# Patient Record
Sex: Female | Born: 1940 | Race: Black or African American | Hispanic: No | Smoking: Former smoker
Health system: Southern US, Community
[De-identification: ages and names within clinical notes are randomized; demographics above are authoritative.]

## PROBLEM LIST (undated history)

## (undated) DIAGNOSIS — E7800 Pure hypercholesterolemia, unspecified: Secondary | ICD-10-CM

## (undated) DIAGNOSIS — I639 Cerebral infarction, unspecified: Secondary | ICD-10-CM

## (undated) DIAGNOSIS — R569 Unspecified convulsions: Secondary | ICD-10-CM

## (undated) DIAGNOSIS — IMO0001 Reserved for inherently not codable concepts without codable children: Secondary | ICD-10-CM

## (undated) DIAGNOSIS — M1990 Unspecified osteoarthritis, unspecified site: Secondary | ICD-10-CM

## (undated) DIAGNOSIS — D126 Benign neoplasm of colon, unspecified: Secondary | ICD-10-CM

## (undated) DIAGNOSIS — K5900 Constipation, unspecified: Secondary | ICD-10-CM

## (undated) DIAGNOSIS — D6489 Other specified anemias: Secondary | ICD-10-CM

## (undated) DIAGNOSIS — G2581 Restless legs syndrome: Secondary | ICD-10-CM

## (undated) DIAGNOSIS — R490 Dysphonia: Secondary | ICD-10-CM

## (undated) DIAGNOSIS — E785 Hyperlipidemia, unspecified: Secondary | ICD-10-CM

## (undated) DIAGNOSIS — F329 Major depressive disorder, single episode, unspecified: Secondary | ICD-10-CM

## (undated) DIAGNOSIS — I10 Essential (primary) hypertension: Secondary | ICD-10-CM

## (undated) DIAGNOSIS — G5711 Meralgia paresthetica, right lower limb: Secondary | ICD-10-CM

## (undated) DIAGNOSIS — M549 Dorsalgia, unspecified: Secondary | ICD-10-CM

## (undated) DIAGNOSIS — N189 Chronic kidney disease, unspecified: Secondary | ICD-10-CM

## (undated) DIAGNOSIS — Z992 Dependence on renal dialysis: Secondary | ICD-10-CM

## (undated) DIAGNOSIS — Z8719 Personal history of other diseases of the digestive system: Secondary | ICD-10-CM

## (undated) DIAGNOSIS — R6889 Other general symptoms and signs: Secondary | ICD-10-CM

## (undated) DIAGNOSIS — I509 Heart failure, unspecified: Secondary | ICD-10-CM

## (undated) DIAGNOSIS — E079 Disorder of thyroid, unspecified: Secondary | ICD-10-CM

## (undated) DIAGNOSIS — E278 Other specified disorders of adrenal gland: Secondary | ICD-10-CM

## (undated) DIAGNOSIS — I739 Peripheral vascular disease, unspecified: Secondary | ICD-10-CM

## (undated) DIAGNOSIS — R269 Unspecified abnormalities of gait and mobility: Principal | ICD-10-CM

## (undated) DIAGNOSIS — R0989 Other specified symptoms and signs involving the circulatory and respiratory systems: Secondary | ICD-10-CM

## (undated) DIAGNOSIS — J449 Chronic obstructive pulmonary disease, unspecified: Secondary | ICD-10-CM

## (undated) DIAGNOSIS — K5792 Diverticulitis of intestine, part unspecified, without perforation or abscess without bleeding: Secondary | ICD-10-CM

## (undated) DIAGNOSIS — G459 Transient cerebral ischemic attack, unspecified: Secondary | ICD-10-CM

## (undated) DIAGNOSIS — J189 Pneumonia, unspecified organism: Secondary | ICD-10-CM

## (undated) DIAGNOSIS — N289 Disorder of kidney and ureter, unspecified: Secondary | ICD-10-CM

## (undated) DIAGNOSIS — K219 Gastro-esophageal reflux disease without esophagitis: Secondary | ICD-10-CM

## (undated) DIAGNOSIS — K589 Irritable bowel syndrome without diarrhea: Secondary | ICD-10-CM

## (undated) DIAGNOSIS — F32A Depression, unspecified: Secondary | ICD-10-CM

## (undated) HISTORY — DX: Other specified symptoms and signs involving the circulatory and respiratory systems: R09.89

## (undated) HISTORY — DX: Meralgia paresthetica, right lower limb: G57.11

## (undated) HISTORY — DX: Unspecified convulsions: R56.9

## (undated) HISTORY — DX: Hyperlipidemia, unspecified: E78.5

## (undated) HISTORY — DX: Chronic kidney disease, unspecified: N18.9

## (undated) HISTORY — DX: Unspecified abnormalities of gait and mobility: R26.9

## (undated) HISTORY — PX: INSERTION OF DIALYSIS CATHETER: SHX1324

## (undated) HISTORY — DX: Dysphonia: R49.0

## (undated) HISTORY — DX: Other general symptoms and signs: R68.89

## (undated) HISTORY — DX: Essential (primary) hypertension: I10

## (undated) HISTORY — PX: ABDOMINAL HYSTERECTOMY: SHX81

## (undated) HISTORY — PX: CHOLECYSTECTOMY: SHX55

## (undated) HISTORY — PX: TUBAL LIGATION: SHX77

## (undated) HISTORY — DX: Dependence on renal dialysis: Z99.2

## (undated) HISTORY — DX: Reserved for inherently not codable concepts without codable children: IMO0001

## (undated) HISTORY — DX: Restless legs syndrome: G25.81

## (undated) HISTORY — DX: Transient cerebral ischemic attack, unspecified: G45.9

## (undated) HISTORY — PX: APPENDECTOMY: SHX54

## (undated) HISTORY — DX: Benign neoplasm of colon, unspecified: D12.6

## (undated) HISTORY — DX: Chronic obstructive pulmonary disease, unspecified: J44.9

---

## 1997-11-29 ENCOUNTER — Encounter: Admission: RE | Admit: 1997-11-29 | Discharge: 1997-11-29 | Payer: Self-pay | Admitting: Family Medicine

## 1997-12-19 ENCOUNTER — Encounter: Admission: RE | Admit: 1997-12-19 | Discharge: 1997-12-19 | Payer: Self-pay | Admitting: Family Medicine

## 1997-12-25 ENCOUNTER — Encounter: Admission: RE | Admit: 1997-12-25 | Discharge: 1997-12-25 | Payer: Self-pay | Admitting: Family Medicine

## 1998-01-03 ENCOUNTER — Encounter: Admission: RE | Admit: 1998-01-03 | Discharge: 1998-01-03 | Payer: Self-pay | Admitting: Family Medicine

## 1998-01-10 ENCOUNTER — Encounter: Admission: RE | Admit: 1998-01-10 | Discharge: 1998-01-10 | Payer: Self-pay | Admitting: Family Medicine

## 1998-01-11 ENCOUNTER — Encounter: Admission: RE | Admit: 1998-01-11 | Discharge: 1998-01-11 | Payer: Self-pay | Admitting: Family Medicine

## 1998-04-12 ENCOUNTER — Encounter: Admission: RE | Admit: 1998-04-12 | Discharge: 1998-04-12 | Payer: Self-pay | Admitting: Family Medicine

## 1998-06-11 ENCOUNTER — Encounter: Admission: RE | Admit: 1998-06-11 | Discharge: 1998-06-11 | Payer: Self-pay | Admitting: Family Medicine

## 1998-06-12 ENCOUNTER — Emergency Department (HOSPITAL_COMMUNITY): Admission: EM | Admit: 1998-06-12 | Discharge: 1998-06-12 | Payer: Self-pay | Admitting: Emergency Medicine

## 1998-06-18 ENCOUNTER — Encounter: Admission: RE | Admit: 1998-06-18 | Discharge: 1998-06-18 | Payer: Self-pay | Admitting: Family Medicine

## 1998-06-22 ENCOUNTER — Encounter: Admission: RE | Admit: 1998-06-22 | Discharge: 1998-06-22 | Payer: Self-pay | Admitting: Family Medicine

## 1998-08-10 ENCOUNTER — Encounter: Admission: RE | Admit: 1998-08-10 | Discharge: 1998-08-10 | Payer: Self-pay | Admitting: Family Medicine

## 1998-09-07 ENCOUNTER — Encounter: Admission: RE | Admit: 1998-09-07 | Discharge: 1998-09-07 | Payer: Self-pay | Admitting: Family Medicine

## 1998-09-14 ENCOUNTER — Encounter: Admission: RE | Admit: 1998-09-14 | Discharge: 1998-09-14 | Payer: Self-pay | Admitting: Sports Medicine

## 1998-10-15 ENCOUNTER — Encounter: Payer: Self-pay | Admitting: Emergency Medicine

## 1998-10-15 ENCOUNTER — Emergency Department (HOSPITAL_COMMUNITY): Admission: EM | Admit: 1998-10-15 | Discharge: 1998-10-15 | Payer: Self-pay | Admitting: Emergency Medicine

## 1998-10-17 ENCOUNTER — Ambulatory Visit (HOSPITAL_COMMUNITY): Admission: RE | Admit: 1998-10-17 | Discharge: 1998-10-17 | Payer: Self-pay | Admitting: Family Medicine

## 1998-10-17 ENCOUNTER — Encounter: Admission: RE | Admit: 1998-10-17 | Discharge: 1998-10-17 | Payer: Self-pay | Admitting: Family Medicine

## 1998-11-01 ENCOUNTER — Encounter: Admission: RE | Admit: 1998-11-01 | Discharge: 1998-11-01 | Payer: Self-pay | Admitting: Family Medicine

## 1998-12-19 ENCOUNTER — Ambulatory Visit (HOSPITAL_COMMUNITY): Admission: RE | Admit: 1998-12-19 | Discharge: 1998-12-19 | Payer: Self-pay | Admitting: Nephrology

## 1999-02-07 ENCOUNTER — Ambulatory Visit (HOSPITAL_COMMUNITY): Admission: RE | Admit: 1999-02-07 | Discharge: 1999-02-08 | Payer: Self-pay | Admitting: Nephrology

## 1999-02-07 ENCOUNTER — Encounter: Payer: Self-pay | Admitting: Nephrology

## 1999-04-05 ENCOUNTER — Encounter: Admission: RE | Admit: 1999-04-05 | Discharge: 1999-04-05 | Payer: Self-pay | Admitting: Family Medicine

## 1999-04-25 ENCOUNTER — Emergency Department (HOSPITAL_COMMUNITY): Admission: EM | Admit: 1999-04-25 | Discharge: 1999-04-25 | Payer: Self-pay | Admitting: Emergency Medicine

## 1999-06-14 ENCOUNTER — Encounter: Admission: RE | Admit: 1999-06-14 | Discharge: 1999-06-14 | Payer: Self-pay | Admitting: Family Medicine

## 1999-10-22 ENCOUNTER — Encounter: Admission: RE | Admit: 1999-10-22 | Discharge: 1999-10-22 | Payer: Self-pay | Admitting: Family Medicine

## 1999-11-06 ENCOUNTER — Encounter: Admission: RE | Admit: 1999-11-06 | Discharge: 1999-11-06 | Payer: Self-pay | Admitting: Family Medicine

## 1999-11-19 ENCOUNTER — Encounter: Admission: RE | Admit: 1999-11-19 | Discharge: 2000-02-17 | Payer: Self-pay | Admitting: Sports Medicine

## 1999-11-20 ENCOUNTER — Encounter: Admission: RE | Admit: 1999-11-20 | Discharge: 1999-11-20 | Payer: Self-pay | Admitting: Family Medicine

## 1999-12-10 ENCOUNTER — Emergency Department (HOSPITAL_COMMUNITY): Admission: EM | Admit: 1999-12-10 | Discharge: 1999-12-10 | Payer: Self-pay | Admitting: Emergency Medicine

## 1999-12-10 ENCOUNTER — Encounter: Payer: Self-pay | Admitting: Emergency Medicine

## 1999-12-31 ENCOUNTER — Encounter: Admission: RE | Admit: 1999-12-31 | Discharge: 1999-12-31 | Payer: Self-pay | Admitting: Neurology

## 1999-12-31 ENCOUNTER — Encounter: Admission: RE | Admit: 1999-12-31 | Discharge: 1999-12-31 | Payer: Self-pay | Admitting: *Deleted

## 1999-12-31 ENCOUNTER — Encounter: Payer: Self-pay | Admitting: *Deleted

## 2000-01-23 ENCOUNTER — Encounter: Admission: RE | Admit: 2000-01-23 | Discharge: 2000-01-23 | Payer: Self-pay | Admitting: Family Medicine

## 2000-02-23 ENCOUNTER — Emergency Department (HOSPITAL_COMMUNITY): Admission: EM | Admit: 2000-02-23 | Discharge: 2000-02-23 | Payer: Self-pay | Admitting: Emergency Medicine

## 2000-02-23 ENCOUNTER — Encounter: Payer: Self-pay | Admitting: Emergency Medicine

## 2000-02-25 ENCOUNTER — Encounter: Admission: RE | Admit: 2000-02-25 | Discharge: 2000-02-25 | Payer: Self-pay | Admitting: Sports Medicine

## 2000-03-31 ENCOUNTER — Encounter: Payer: Self-pay | Admitting: Gastroenterology

## 2000-03-31 ENCOUNTER — Encounter: Admission: RE | Admit: 2000-03-31 | Discharge: 2000-03-31 | Payer: Self-pay | Admitting: Gastroenterology

## 2000-04-07 ENCOUNTER — Encounter: Admission: RE | Admit: 2000-04-07 | Discharge: 2000-04-07 | Payer: Self-pay | Admitting: Family Medicine

## 2000-05-02 ENCOUNTER — Encounter (INDEPENDENT_AMBULATORY_CARE_PROVIDER_SITE_OTHER): Payer: Self-pay | Admitting: *Deleted

## 2000-05-28 ENCOUNTER — Encounter: Admission: RE | Admit: 2000-05-28 | Discharge: 2000-05-28 | Payer: Self-pay | Admitting: Family Medicine

## 2000-05-28 ENCOUNTER — Encounter: Payer: Self-pay | Admitting: Family Medicine

## 2000-05-28 ENCOUNTER — Observation Stay (HOSPITAL_COMMUNITY): Admission: AD | Admit: 2000-05-28 | Discharge: 2000-05-29 | Payer: Self-pay | Admitting: Family Medicine

## 2000-05-29 ENCOUNTER — Encounter: Payer: Self-pay | Admitting: Family Medicine

## 2000-06-02 HISTORY — PX: NASAL SINUS SURGERY: SHX719

## 2000-06-02 HISTORY — PX: OTHER SURGICAL HISTORY: SHX169

## 2000-07-27 ENCOUNTER — Encounter: Payer: Self-pay | Admitting: Sports Medicine

## 2000-07-27 ENCOUNTER — Encounter: Admission: RE | Admit: 2000-07-27 | Discharge: 2000-07-27 | Payer: Self-pay | Admitting: Sports Medicine

## 2000-07-27 ENCOUNTER — Encounter: Admission: RE | Admit: 2000-07-27 | Discharge: 2000-07-27 | Payer: Self-pay | Admitting: Family Medicine

## 2000-08-04 ENCOUNTER — Encounter: Admission: RE | Admit: 2000-08-04 | Discharge: 2000-08-04 | Payer: Self-pay | Admitting: Family Medicine

## 2000-08-19 ENCOUNTER — Encounter: Admission: RE | Admit: 2000-08-19 | Discharge: 2000-08-19 | Payer: Self-pay | Admitting: Sports Medicine

## 2000-08-19 ENCOUNTER — Encounter: Payer: Self-pay | Admitting: Sports Medicine

## 2000-08-21 ENCOUNTER — Encounter: Admission: RE | Admit: 2000-08-21 | Discharge: 2000-08-21 | Payer: Self-pay | Admitting: Family Medicine

## 2000-09-02 ENCOUNTER — Ambulatory Visit: Admission: RE | Admit: 2000-09-02 | Discharge: 2000-09-02 | Payer: Self-pay | Admitting: Gynecology

## 2000-09-08 ENCOUNTER — Encounter: Admission: RE | Admit: 2000-09-08 | Discharge: 2000-09-08 | Payer: Self-pay | Admitting: Family Medicine

## 2000-09-18 ENCOUNTER — Encounter: Payer: Self-pay | Admitting: Gynecology

## 2000-09-22 ENCOUNTER — Encounter (INDEPENDENT_AMBULATORY_CARE_PROVIDER_SITE_OTHER): Payer: Self-pay

## 2000-09-22 ENCOUNTER — Inpatient Hospital Stay (HOSPITAL_COMMUNITY): Admission: RE | Admit: 2000-09-22 | Discharge: 2000-09-26 | Payer: Self-pay | Admitting: Gynecology

## 2000-09-27 ENCOUNTER — Encounter: Payer: Self-pay | Admitting: Emergency Medicine

## 2000-09-28 ENCOUNTER — Encounter: Payer: Self-pay | Admitting: Neurology

## 2000-09-28 ENCOUNTER — Encounter (INDEPENDENT_AMBULATORY_CARE_PROVIDER_SITE_OTHER): Payer: Self-pay | Admitting: *Deleted

## 2000-09-28 ENCOUNTER — Inpatient Hospital Stay (HOSPITAL_COMMUNITY): Admission: EM | Admit: 2000-09-28 | Discharge: 2000-10-03 | Payer: Self-pay | Admitting: Family Medicine

## 2000-10-01 ENCOUNTER — Encounter: Payer: Self-pay | Admitting: Family Medicine

## 2000-10-06 ENCOUNTER — Emergency Department (HOSPITAL_COMMUNITY): Admission: EM | Admit: 2000-10-06 | Discharge: 2000-10-06 | Payer: Self-pay | Admitting: Emergency Medicine

## 2000-10-11 ENCOUNTER — Emergency Department (HOSPITAL_COMMUNITY): Admission: EM | Admit: 2000-10-11 | Discharge: 2000-10-11 | Payer: Self-pay | Admitting: Emergency Medicine

## 2000-10-11 ENCOUNTER — Encounter: Payer: Self-pay | Admitting: Neurology

## 2000-10-12 ENCOUNTER — Encounter: Admission: RE | Admit: 2000-10-12 | Discharge: 2000-10-12 | Payer: Self-pay | Admitting: Family Medicine

## 2000-10-29 ENCOUNTER — Encounter: Admission: RE | Admit: 2000-10-29 | Discharge: 2000-10-29 | Payer: Self-pay | Admitting: Family Medicine

## 2000-11-03 ENCOUNTER — Encounter: Admission: RE | Admit: 2000-11-03 | Discharge: 2000-11-03 | Payer: Self-pay | Admitting: Obstetrics & Gynecology

## 2000-11-06 ENCOUNTER — Encounter: Admission: RE | Admit: 2000-11-06 | Discharge: 2000-11-06 | Payer: Self-pay | Admitting: Family Medicine

## 2000-11-11 ENCOUNTER — Encounter: Payer: Self-pay | Admitting: Neurological Surgery

## 2000-11-11 ENCOUNTER — Ambulatory Visit (HOSPITAL_COMMUNITY): Admission: RE | Admit: 2000-11-11 | Discharge: 2000-11-11 | Payer: Self-pay | Admitting: Neurological Surgery

## 2000-12-02 ENCOUNTER — Encounter: Admission: RE | Admit: 2000-12-02 | Discharge: 2000-12-02 | Payer: Self-pay | Admitting: Family Medicine

## 2000-12-14 ENCOUNTER — Encounter: Admission: RE | Admit: 2000-12-14 | Discharge: 2000-12-14 | Payer: Self-pay | Admitting: Family Medicine

## 2000-12-22 ENCOUNTER — Encounter: Payer: Self-pay | Admitting: Neurological Surgery

## 2000-12-24 ENCOUNTER — Inpatient Hospital Stay (HOSPITAL_COMMUNITY): Admission: RE | Admit: 2000-12-24 | Discharge: 2001-01-01 | Payer: Self-pay | Admitting: Neurological Surgery

## 2000-12-28 ENCOUNTER — Encounter: Payer: Self-pay | Admitting: Neurological Surgery

## 2001-01-14 ENCOUNTER — Encounter: Admission: RE | Admit: 2001-01-14 | Discharge: 2001-01-14 | Payer: Self-pay | Admitting: Family Medicine

## 2001-02-15 ENCOUNTER — Ambulatory Visit (HOSPITAL_COMMUNITY): Admission: RE | Admit: 2001-02-15 | Discharge: 2001-02-15 | Payer: Self-pay | Admitting: Neurological Surgery

## 2001-02-15 ENCOUNTER — Encounter: Payer: Self-pay | Admitting: Neurological Surgery

## 2001-02-17 ENCOUNTER — Encounter: Admission: RE | Admit: 2001-02-17 | Discharge: 2001-02-17 | Payer: Self-pay | Admitting: Family Medicine

## 2001-04-07 ENCOUNTER — Encounter: Admission: RE | Admit: 2001-04-07 | Discharge: 2001-04-07 | Payer: Self-pay | Admitting: Family Medicine

## 2001-05-17 ENCOUNTER — Encounter: Admission: RE | Admit: 2001-05-17 | Discharge: 2001-05-17 | Payer: Self-pay | Admitting: Family Medicine

## 2001-06-02 HISTORY — PX: CARDIAC CATHETERIZATION: SHX172

## 2001-07-08 ENCOUNTER — Encounter: Admission: RE | Admit: 2001-07-08 | Discharge: 2001-07-08 | Payer: Self-pay | Admitting: Family Medicine

## 2001-07-08 ENCOUNTER — Ambulatory Visit (HOSPITAL_COMMUNITY): Admission: RE | Admit: 2001-07-08 | Discharge: 2001-07-08 | Payer: Self-pay | Admitting: Family Medicine

## 2001-07-14 ENCOUNTER — Encounter: Admission: RE | Admit: 2001-07-14 | Discharge: 2001-07-14 | Payer: Self-pay | Admitting: Family Medicine

## 2001-07-27 ENCOUNTER — Encounter: Payer: Self-pay | Admitting: Cardiovascular Disease

## 2001-07-27 ENCOUNTER — Ambulatory Visit (HOSPITAL_COMMUNITY): Admission: RE | Admit: 2001-07-27 | Discharge: 2001-07-27 | Payer: Self-pay | Admitting: Cardiovascular Disease

## 2001-08-17 ENCOUNTER — Encounter: Payer: Self-pay | Admitting: Neurological Surgery

## 2001-08-17 ENCOUNTER — Ambulatory Visit (HOSPITAL_COMMUNITY): Admission: RE | Admit: 2001-08-17 | Discharge: 2001-08-17 | Payer: Self-pay | Admitting: Neurological Surgery

## 2001-08-23 ENCOUNTER — Encounter: Payer: Self-pay | Admitting: Sports Medicine

## 2001-08-23 ENCOUNTER — Encounter: Admission: RE | Admit: 2001-08-23 | Discharge: 2001-08-23 | Payer: Self-pay | Admitting: Sports Medicine

## 2001-08-24 ENCOUNTER — Encounter: Admission: RE | Admit: 2001-08-24 | Discharge: 2001-08-24 | Payer: Self-pay | Admitting: Family Medicine

## 2001-09-27 ENCOUNTER — Encounter: Admission: RE | Admit: 2001-09-27 | Discharge: 2001-09-27 | Payer: Self-pay | Admitting: Sports Medicine

## 2001-11-26 ENCOUNTER — Ambulatory Visit (HOSPITAL_COMMUNITY): Admission: RE | Admit: 2001-11-26 | Discharge: 2001-11-26 | Payer: Self-pay | Admitting: Cardiovascular Disease

## 2001-12-20 ENCOUNTER — Encounter: Admission: RE | Admit: 2001-12-20 | Discharge: 2001-12-20 | Payer: Self-pay | Admitting: Family Medicine

## 2001-12-27 ENCOUNTER — Encounter: Admission: RE | Admit: 2001-12-27 | Discharge: 2001-12-27 | Payer: Self-pay | Admitting: Sports Medicine

## 2001-12-27 ENCOUNTER — Encounter: Payer: Self-pay | Admitting: Sports Medicine

## 2001-12-28 ENCOUNTER — Encounter: Admission: RE | Admit: 2001-12-28 | Discharge: 2001-12-28 | Payer: Self-pay | Admitting: Sports Medicine

## 2002-01-05 ENCOUNTER — Encounter: Admission: RE | Admit: 2002-01-05 | Discharge: 2002-01-05 | Payer: Self-pay | Admitting: Family Medicine

## 2002-01-07 ENCOUNTER — Encounter: Admission: RE | Admit: 2002-01-07 | Discharge: 2002-04-07 | Payer: Self-pay | Admitting: Sports Medicine

## 2002-01-25 ENCOUNTER — Encounter: Admission: RE | Admit: 2002-01-25 | Discharge: 2002-01-25 | Payer: Self-pay | Admitting: Family Medicine

## 2002-03-04 ENCOUNTER — Encounter: Admission: RE | Admit: 2002-03-04 | Discharge: 2002-03-04 | Payer: Self-pay | Admitting: Family Medicine

## 2002-04-14 ENCOUNTER — Encounter: Admission: RE | Admit: 2002-04-14 | Discharge: 2002-04-14 | Payer: Self-pay | Admitting: Family Medicine

## 2002-05-30 ENCOUNTER — Encounter: Admission: RE | Admit: 2002-05-30 | Discharge: 2002-05-30 | Payer: Self-pay | Admitting: Sports Medicine

## 2002-06-07 ENCOUNTER — Encounter: Admission: RE | Admit: 2002-06-07 | Discharge: 2002-06-07 | Payer: Self-pay | Admitting: Sports Medicine

## 2002-06-07 ENCOUNTER — Encounter: Payer: Self-pay | Admitting: Sports Medicine

## 2002-06-14 ENCOUNTER — Encounter: Admission: RE | Admit: 2002-06-14 | Discharge: 2002-06-14 | Payer: Self-pay | Admitting: Sports Medicine

## 2002-06-20 ENCOUNTER — Encounter: Admission: RE | Admit: 2002-06-20 | Discharge: 2002-06-20 | Payer: Self-pay | Admitting: Sports Medicine

## 2002-06-20 ENCOUNTER — Encounter: Payer: Self-pay | Admitting: Sports Medicine

## 2002-06-23 ENCOUNTER — Encounter: Payer: Self-pay | Admitting: Emergency Medicine

## 2002-06-23 ENCOUNTER — Emergency Department (HOSPITAL_COMMUNITY): Admission: EM | Admit: 2002-06-23 | Discharge: 2002-06-23 | Payer: Self-pay | Admitting: Emergency Medicine

## 2002-07-11 ENCOUNTER — Encounter: Admission: RE | Admit: 2002-07-11 | Discharge: 2002-07-11 | Payer: Self-pay | Admitting: Family Medicine

## 2002-07-12 ENCOUNTER — Encounter: Admission: RE | Admit: 2002-07-12 | Discharge: 2002-07-12 | Payer: Self-pay | Admitting: Family Medicine

## 2002-07-17 ENCOUNTER — Encounter: Payer: Self-pay | Admitting: *Deleted

## 2002-07-17 ENCOUNTER — Encounter: Admission: RE | Admit: 2002-07-17 | Discharge: 2002-07-17 | Payer: Self-pay | Admitting: *Deleted

## 2002-08-09 ENCOUNTER — Ambulatory Visit (HOSPITAL_COMMUNITY): Admission: RE | Admit: 2002-08-09 | Discharge: 2002-08-09 | Payer: Self-pay | Admitting: Family Medicine

## 2002-08-09 ENCOUNTER — Encounter: Admission: RE | Admit: 2002-08-09 | Discharge: 2002-08-09 | Payer: Self-pay | Admitting: Family Medicine

## 2002-09-22 ENCOUNTER — Encounter: Admission: RE | Admit: 2002-09-22 | Discharge: 2002-09-22 | Payer: Self-pay | Admitting: Family Medicine

## 2002-10-07 ENCOUNTER — Emergency Department (HOSPITAL_COMMUNITY): Admission: EM | Admit: 2002-10-07 | Discharge: 2002-10-07 | Payer: Self-pay | Admitting: Emergency Medicine

## 2002-10-13 ENCOUNTER — Encounter: Admission: RE | Admit: 2002-10-13 | Discharge: 2002-10-13 | Payer: Self-pay | Admitting: Family Medicine

## 2002-10-19 ENCOUNTER — Encounter: Admission: RE | Admit: 2002-10-19 | Discharge: 2002-10-19 | Payer: Self-pay | Admitting: Family Medicine

## 2002-11-02 ENCOUNTER — Encounter: Admission: RE | Admit: 2002-11-02 | Discharge: 2002-11-02 | Payer: Self-pay | Admitting: Family Medicine

## 2002-11-07 ENCOUNTER — Encounter: Payer: Self-pay | Admitting: Family Medicine

## 2002-11-07 ENCOUNTER — Encounter: Admission: RE | Admit: 2002-11-07 | Discharge: 2002-11-07 | Payer: Self-pay | Admitting: Family Medicine

## 2002-11-09 ENCOUNTER — Encounter: Admission: RE | Admit: 2002-11-09 | Discharge: 2002-11-09 | Payer: Self-pay | Admitting: Family Medicine

## 2002-11-11 ENCOUNTER — Encounter: Admission: RE | Admit: 2002-11-11 | Discharge: 2002-11-11 | Payer: Self-pay | Admitting: Family Medicine

## 2002-11-17 ENCOUNTER — Encounter: Admission: RE | Admit: 2002-11-17 | Discharge: 2002-11-17 | Payer: Self-pay | Admitting: Family Medicine

## 2002-11-23 ENCOUNTER — Encounter: Admission: RE | Admit: 2002-11-23 | Discharge: 2002-11-23 | Payer: Self-pay | Admitting: Family Medicine

## 2002-11-24 ENCOUNTER — Encounter: Admission: RE | Admit: 2002-11-24 | Discharge: 2002-11-24 | Payer: Self-pay | Admitting: Sports Medicine

## 2002-11-24 ENCOUNTER — Encounter: Payer: Self-pay | Admitting: Sports Medicine

## 2002-12-08 ENCOUNTER — Encounter: Admission: RE | Admit: 2002-12-08 | Discharge: 2002-12-08 | Payer: Self-pay | Admitting: Family Medicine

## 2002-12-14 ENCOUNTER — Encounter: Admission: RE | Admit: 2002-12-14 | Discharge: 2002-12-14 | Payer: Self-pay | Admitting: Pediatrics

## 2002-12-28 ENCOUNTER — Encounter: Admission: RE | Admit: 2002-12-28 | Discharge: 2002-12-28 | Payer: Self-pay | Admitting: Family Medicine

## 2003-01-30 ENCOUNTER — Encounter: Admission: RE | Admit: 2003-01-30 | Discharge: 2003-01-30 | Payer: Self-pay | Admitting: Family Medicine

## 2003-03-22 ENCOUNTER — Encounter: Admission: RE | Admit: 2003-03-22 | Discharge: 2003-03-22 | Payer: Self-pay | Admitting: Family Medicine

## 2003-06-30 ENCOUNTER — Encounter: Admission: RE | Admit: 2003-06-30 | Discharge: 2003-06-30 | Payer: Self-pay | Admitting: *Deleted

## 2003-09-04 ENCOUNTER — Encounter: Admission: RE | Admit: 2003-09-04 | Discharge: 2003-09-04 | Payer: Self-pay | Admitting: Family Medicine

## 2003-10-05 ENCOUNTER — Emergency Department (HOSPITAL_COMMUNITY): Admission: EM | Admit: 2003-10-05 | Discharge: 2003-10-05 | Payer: Self-pay | Admitting: Emergency Medicine

## 2003-10-11 ENCOUNTER — Encounter: Admission: RE | Admit: 2003-10-11 | Discharge: 2003-10-11 | Payer: Self-pay | Admitting: Sports Medicine

## 2003-10-11 ENCOUNTER — Encounter: Admission: RE | Admit: 2003-10-11 | Discharge: 2003-10-11 | Payer: Self-pay | Admitting: Family Medicine

## 2004-01-24 ENCOUNTER — Encounter: Admission: RE | Admit: 2004-01-24 | Discharge: 2004-01-24 | Payer: Self-pay | Admitting: Family Medicine

## 2004-03-04 ENCOUNTER — Ambulatory Visit: Payer: Self-pay | Admitting: Family Medicine

## 2004-04-11 ENCOUNTER — Ambulatory Visit: Payer: Self-pay | Admitting: Sports Medicine

## 2004-04-16 ENCOUNTER — Ambulatory Visit: Payer: Self-pay | Admitting: Family Medicine

## 2004-06-04 ENCOUNTER — Ambulatory Visit: Payer: Self-pay | Admitting: Family Medicine

## 2004-09-11 ENCOUNTER — Ambulatory Visit: Payer: Self-pay | Admitting: Family Medicine

## 2004-10-01 ENCOUNTER — Ambulatory Visit: Payer: Self-pay | Admitting: Family Medicine

## 2004-10-08 ENCOUNTER — Ambulatory Visit: Payer: Self-pay | Admitting: Sports Medicine

## 2004-10-21 ENCOUNTER — Encounter: Admission: RE | Admit: 2004-10-21 | Discharge: 2004-10-21 | Payer: Self-pay | Admitting: Sports Medicine

## 2004-11-11 ENCOUNTER — Ambulatory Visit: Payer: Self-pay | Admitting: Family Medicine

## 2004-11-25 ENCOUNTER — Ambulatory Visit: Payer: Self-pay | Admitting: Sports Medicine

## 2004-11-29 ENCOUNTER — Ambulatory Visit: Payer: Self-pay | Admitting: Family Medicine

## 2004-12-11 ENCOUNTER — Ambulatory Visit: Payer: Self-pay | Admitting: Family Medicine

## 2004-12-30 ENCOUNTER — Ambulatory Visit: Payer: Self-pay | Admitting: Family Medicine

## 2004-12-31 ENCOUNTER — Ambulatory Visit: Payer: Self-pay | Admitting: Family Medicine

## 2005-01-20 ENCOUNTER — Ambulatory Visit: Payer: Self-pay | Admitting: Sports Medicine

## 2005-05-05 ENCOUNTER — Ambulatory Visit: Payer: Self-pay | Admitting: Family Medicine

## 2005-05-20 ENCOUNTER — Ambulatory Visit: Payer: Self-pay | Admitting: Sports Medicine

## 2005-06-18 ENCOUNTER — Ambulatory Visit: Payer: Self-pay | Admitting: Family Medicine

## 2005-06-24 ENCOUNTER — Ambulatory Visit (HOSPITAL_COMMUNITY): Admission: RE | Admit: 2005-06-24 | Discharge: 2005-06-24 | Payer: Self-pay | Admitting: Sports Medicine

## 2005-07-15 ENCOUNTER — Ambulatory Visit (HOSPITAL_COMMUNITY): Admission: RE | Admit: 2005-07-15 | Discharge: 2005-07-15 | Payer: Self-pay | Admitting: Family Medicine

## 2005-07-16 ENCOUNTER — Ambulatory Visit: Payer: Self-pay | Admitting: Family Medicine

## 2005-08-18 ENCOUNTER — Ambulatory Visit: Payer: Self-pay | Admitting: Family Medicine

## 2005-09-10 ENCOUNTER — Ambulatory Visit: Payer: Self-pay | Admitting: Family Medicine

## 2005-09-12 ENCOUNTER — Ambulatory Visit (HOSPITAL_COMMUNITY): Admission: RE | Admit: 2005-09-12 | Discharge: 2005-09-12 | Payer: Self-pay | Admitting: Family Medicine

## 2005-09-19 ENCOUNTER — Ambulatory Visit: Payer: Self-pay | Admitting: Family Medicine

## 2005-10-01 ENCOUNTER — Ambulatory Visit: Payer: Self-pay | Admitting: Family Medicine

## 2006-01-22 ENCOUNTER — Ambulatory Visit (HOSPITAL_COMMUNITY): Admission: RE | Admit: 2006-01-22 | Discharge: 2006-01-22 | Payer: Self-pay | Admitting: Gastroenterology

## 2006-02-26 ENCOUNTER — Ambulatory Visit: Payer: Self-pay | Admitting: Family Medicine

## 2006-03-11 ENCOUNTER — Ambulatory Visit: Payer: Self-pay | Admitting: Family Medicine

## 2006-04-06 ENCOUNTER — Ambulatory Visit: Payer: Self-pay | Admitting: Sports Medicine

## 2006-04-20 ENCOUNTER — Ambulatory Visit: Payer: Self-pay | Admitting: Family Medicine

## 2006-05-29 ENCOUNTER — Ambulatory Visit (HOSPITAL_COMMUNITY): Admission: RE | Admit: 2006-05-29 | Discharge: 2006-05-29 | Payer: Self-pay | Admitting: Family Medicine

## 2006-05-29 ENCOUNTER — Ambulatory Visit: Payer: Self-pay | Admitting: Family Medicine

## 2006-06-03 ENCOUNTER — Ambulatory Visit: Payer: Self-pay

## 2006-06-04 ENCOUNTER — Ambulatory Visit (HOSPITAL_COMMUNITY): Admission: RE | Admit: 2006-06-04 | Discharge: 2006-06-04 | Payer: Self-pay | Admitting: Family Medicine

## 2006-06-04 ENCOUNTER — Ambulatory Visit: Payer: Self-pay | Admitting: Family Medicine

## 2006-06-11 ENCOUNTER — Ambulatory Visit: Payer: Self-pay | Admitting: Family Medicine

## 2006-06-12 ENCOUNTER — Emergency Department (HOSPITAL_COMMUNITY): Admission: EM | Admit: 2006-06-12 | Discharge: 2006-06-13 | Payer: Self-pay | Admitting: Emergency Medicine

## 2006-07-30 DIAGNOSIS — K219 Gastro-esophageal reflux disease without esophagitis: Secondary | ICD-10-CM

## 2006-07-30 DIAGNOSIS — E7800 Pure hypercholesterolemia, unspecified: Secondary | ICD-10-CM

## 2006-07-30 DIAGNOSIS — E669 Obesity, unspecified: Secondary | ICD-10-CM

## 2006-07-30 DIAGNOSIS — I129 Hypertensive chronic kidney disease with stage 1 through stage 4 chronic kidney disease, or unspecified chronic kidney disease: Secondary | ICD-10-CM | POA: Insufficient documentation

## 2006-07-30 DIAGNOSIS — N3941 Urge incontinence: Secondary | ICD-10-CM | POA: Insufficient documentation

## 2006-07-30 DIAGNOSIS — F324 Major depressive disorder, single episode, in partial remission: Secondary | ICD-10-CM | POA: Insufficient documentation

## 2006-07-30 DIAGNOSIS — M109 Gout, unspecified: Secondary | ICD-10-CM | POA: Insufficient documentation

## 2006-07-31 ENCOUNTER — Encounter (INDEPENDENT_AMBULATORY_CARE_PROVIDER_SITE_OTHER): Payer: Self-pay | Admitting: *Deleted

## 2006-10-20 ENCOUNTER — Encounter (INDEPENDENT_AMBULATORY_CARE_PROVIDER_SITE_OTHER): Payer: Self-pay | Admitting: Family Medicine

## 2006-11-24 ENCOUNTER — Encounter (INDEPENDENT_AMBULATORY_CARE_PROVIDER_SITE_OTHER): Payer: Self-pay | Admitting: Family Medicine

## 2006-12-09 ENCOUNTER — Telehealth: Payer: Self-pay | Admitting: *Deleted

## 2006-12-10 ENCOUNTER — Inpatient Hospital Stay (HOSPITAL_COMMUNITY): Admission: AD | Admit: 2006-12-10 | Discharge: 2006-12-11 | Payer: Self-pay | Admitting: Family Medicine

## 2006-12-10 ENCOUNTER — Ambulatory Visit: Payer: Self-pay | Admitting: Family Medicine

## 2006-12-10 ENCOUNTER — Ambulatory Visit (HOSPITAL_COMMUNITY): Admission: RE | Admit: 2006-12-10 | Discharge: 2006-12-10 | Payer: Self-pay | Admitting: Family Medicine

## 2006-12-10 DIAGNOSIS — R1310 Dysphagia, unspecified: Secondary | ICD-10-CM | POA: Insufficient documentation

## 2006-12-10 DIAGNOSIS — K449 Diaphragmatic hernia without obstruction or gangrene: Secondary | ICD-10-CM | POA: Insufficient documentation

## 2006-12-10 DIAGNOSIS — F17200 Nicotine dependence, unspecified, uncomplicated: Secondary | ICD-10-CM

## 2006-12-10 DIAGNOSIS — R0789 Other chest pain: Secondary | ICD-10-CM

## 2006-12-15 ENCOUNTER — Ambulatory Visit: Payer: Self-pay | Admitting: Family Medicine

## 2006-12-23 ENCOUNTER — Encounter (INDEPENDENT_AMBULATORY_CARE_PROVIDER_SITE_OTHER): Payer: Self-pay | Admitting: Family Medicine

## 2006-12-31 ENCOUNTER — Telehealth: Payer: Self-pay | Admitting: *Deleted

## 2007-01-04 ENCOUNTER — Encounter (INDEPENDENT_AMBULATORY_CARE_PROVIDER_SITE_OTHER): Payer: Self-pay | Admitting: Family Medicine

## 2007-01-18 ENCOUNTER — Encounter: Admission: RE | Admit: 2007-01-18 | Discharge: 2007-01-18 | Payer: Self-pay | Admitting: *Deleted

## 2007-01-27 ENCOUNTER — Encounter (INDEPENDENT_AMBULATORY_CARE_PROVIDER_SITE_OTHER): Payer: Self-pay | Admitting: Family Medicine

## 2007-02-08 ENCOUNTER — Telehealth: Payer: Self-pay | Admitting: *Deleted

## 2007-02-10 ENCOUNTER — Encounter (INDEPENDENT_AMBULATORY_CARE_PROVIDER_SITE_OTHER): Payer: Self-pay | Admitting: Family Medicine

## 2007-02-11 ENCOUNTER — Telehealth: Payer: Self-pay | Admitting: Family Medicine

## 2007-02-15 ENCOUNTER — Telehealth (INDEPENDENT_AMBULATORY_CARE_PROVIDER_SITE_OTHER): Payer: Self-pay | Admitting: Family Medicine

## 2007-03-15 ENCOUNTER — Telehealth: Payer: Self-pay | Admitting: *Deleted

## 2007-03-23 ENCOUNTER — Encounter (INDEPENDENT_AMBULATORY_CARE_PROVIDER_SITE_OTHER): Payer: Self-pay | Admitting: Family Medicine

## 2007-03-23 ENCOUNTER — Ambulatory Visit: Payer: Self-pay | Admitting: Family Medicine

## 2007-03-23 DIAGNOSIS — R5383 Other fatigue: Secondary | ICD-10-CM

## 2007-03-23 DIAGNOSIS — R5381 Other malaise: Secondary | ICD-10-CM

## 2007-03-23 LAB — CONVERTED CEMR LAB
Albumin: 3.9 g/dL (ref 3.5–5.2)
Alkaline Phosphatase: 107 units/L (ref 39–117)
Blood in Urine, dipstick: NEGATIVE
CO2: 24 meq/L (ref 19–32)
Calcium: 9.6 mg/dL (ref 8.4–10.5)
Creatinine, Ser: 2.42 mg/dL — ABNORMAL HIGH (ref 0.40–1.20)
Glucose, Urine, Semiquant: NEGATIVE
Ketones, urine, test strip: NEGATIVE
MCV: 86.3 fL (ref 78.0–100.0)
Nitrite: NEGATIVE
Platelets: 299 10*3/uL (ref 150–400)
Protein, U semiquant: >=300
RBC: 4.38 M/uL (ref 3.87–5.11)
Sodium: 142 meq/L (ref 135–145)
Specific Gravity, Urine: >=1.03
Urobilinogen, UA: 0.2
WBC: 5.9 10*3/uL (ref 4.0–10.5)

## 2007-03-24 ENCOUNTER — Encounter (INDEPENDENT_AMBULATORY_CARE_PROVIDER_SITE_OTHER): Payer: Self-pay | Admitting: Family Medicine

## 2007-03-26 ENCOUNTER — Encounter (INDEPENDENT_AMBULATORY_CARE_PROVIDER_SITE_OTHER): Payer: Self-pay | Admitting: Family Medicine

## 2007-03-31 ENCOUNTER — Encounter (INDEPENDENT_AMBULATORY_CARE_PROVIDER_SITE_OTHER): Payer: Self-pay | Admitting: Family Medicine

## 2007-03-31 ENCOUNTER — Ambulatory Visit: Payer: Self-pay | Admitting: Family Medicine

## 2007-03-31 DIAGNOSIS — D6489 Other specified anemias: Secondary | ICD-10-CM

## 2007-03-31 DIAGNOSIS — D638 Anemia in other chronic diseases classified elsewhere: Secondary | ICD-10-CM

## 2007-03-31 HISTORY — DX: Other specified anemias: D64.89

## 2007-03-31 LAB — CONVERTED CEMR LAB
Ferritin: 313 ng/mL — ABNORMAL HIGH (ref 10–291)
Iron: 48 ug/dL (ref 42–145)
Vitamin B-12: 496 pg/mL (ref 211–911)

## 2007-04-01 ENCOUNTER — Encounter (INDEPENDENT_AMBULATORY_CARE_PROVIDER_SITE_OTHER): Payer: Self-pay | Admitting: Family Medicine

## 2007-04-05 ENCOUNTER — Telehealth: Payer: Self-pay | Admitting: *Deleted

## 2007-04-09 ENCOUNTER — Encounter (INDEPENDENT_AMBULATORY_CARE_PROVIDER_SITE_OTHER): Payer: Self-pay | Admitting: Family Medicine

## 2007-04-15 ENCOUNTER — Ambulatory Visit: Payer: Self-pay | Admitting: Family Medicine

## 2007-04-15 DIAGNOSIS — E8881 Metabolic syndrome: Secondary | ICD-10-CM

## 2007-04-23 ENCOUNTER — Ambulatory Visit: Payer: Self-pay | Admitting: Family Medicine

## 2007-04-23 DIAGNOSIS — F419 Anxiety disorder, unspecified: Secondary | ICD-10-CM

## 2007-04-23 DIAGNOSIS — F329 Major depressive disorder, single episode, unspecified: Secondary | ICD-10-CM | POA: Insufficient documentation

## 2007-04-27 ENCOUNTER — Encounter (INDEPENDENT_AMBULATORY_CARE_PROVIDER_SITE_OTHER): Payer: Self-pay | Admitting: Family Medicine

## 2007-06-01 ENCOUNTER — Encounter (INDEPENDENT_AMBULATORY_CARE_PROVIDER_SITE_OTHER): Payer: Self-pay | Admitting: *Deleted

## 2007-06-01 ENCOUNTER — Emergency Department (HOSPITAL_COMMUNITY): Admission: EM | Admit: 2007-06-01 | Discharge: 2007-06-01 | Payer: Self-pay | Admitting: Emergency Medicine

## 2007-06-07 ENCOUNTER — Ambulatory Visit: Payer: Self-pay | Admitting: Family Medicine

## 2007-06-07 ENCOUNTER — Telehealth: Payer: Self-pay | Admitting: *Deleted

## 2007-06-07 ENCOUNTER — Encounter (INDEPENDENT_AMBULATORY_CARE_PROVIDER_SITE_OTHER): Payer: Self-pay | Admitting: Family Medicine

## 2007-06-07 DIAGNOSIS — N184 Chronic kidney disease, stage 4 (severe): Secondary | ICD-10-CM | POA: Insufficient documentation

## 2007-06-07 DIAGNOSIS — N2581 Secondary hyperparathyroidism of renal origin: Secondary | ICD-10-CM

## 2007-06-07 LAB — CONVERTED CEMR LAB
ALT: 11 units/L (ref 0–35)
Albumin: 3.7 g/dL (ref 3.5–5.2)
BUN: 36 mg/dL — ABNORMAL HIGH (ref 6–23)
Calcium, Total (PTH): 9.4 mg/dL (ref 8.4–10.5)
Calcium: 9.4 mg/dL (ref 8.4–10.5)
Chloride: 111 meq/L (ref 96–112)
PTH: 150.1 pg/mL — ABNORMAL HIGH (ref 14.0–72.0)
Phosphorus: 3.9 mg/dL (ref 2.3–4.6)
Potassium: 4.9 meq/L (ref 3.5–5.3)
Total Protein: 7.1 g/dL (ref 6.0–8.3)

## 2007-06-30 ENCOUNTER — Encounter (INDEPENDENT_AMBULATORY_CARE_PROVIDER_SITE_OTHER): Payer: Self-pay | Admitting: Family Medicine

## 2007-08-18 ENCOUNTER — Ambulatory Visit: Payer: Self-pay | Admitting: Family Medicine

## 2007-08-18 ENCOUNTER — Telehealth: Payer: Self-pay | Admitting: *Deleted

## 2007-08-18 DIAGNOSIS — M159 Polyosteoarthritis, unspecified: Secondary | ICD-10-CM | POA: Insufficient documentation

## 2007-08-20 ENCOUNTER — Ambulatory Visit: Payer: Self-pay | Admitting: Family Medicine

## 2007-08-20 ENCOUNTER — Telehealth: Payer: Self-pay | Admitting: *Deleted

## 2007-08-23 ENCOUNTER — Telehealth: Payer: Self-pay | Admitting: Family Medicine

## 2007-09-01 ENCOUNTER — Ambulatory Visit: Payer: Self-pay | Admitting: Family Medicine

## 2007-09-01 ENCOUNTER — Encounter (INDEPENDENT_AMBULATORY_CARE_PROVIDER_SITE_OTHER): Payer: Self-pay | Admitting: Family Medicine

## 2007-09-01 LAB — CONVERTED CEMR LAB
Albumin: 3.8 g/dL (ref 3.5–5.2)
Calcium: 9.6 mg/dL (ref 8.4–10.5)
Chloride: 109 meq/L (ref 96–112)
Platelets: 272 10*3/uL (ref 150–400)
Potassium: 4.9 meq/L (ref 3.5–5.3)
RDW: 16.3 % — ABNORMAL HIGH (ref 11.5–15.5)
Sodium: 142 meq/L (ref 135–145)
TIBC: 279 ug/dL (ref 250–470)
Total Protein, Urine: 52
UIBC: 207 ug/dL

## 2007-09-06 ENCOUNTER — Telehealth: Payer: Self-pay | Admitting: *Deleted

## 2007-09-13 ENCOUNTER — Encounter (INDEPENDENT_AMBULATORY_CARE_PROVIDER_SITE_OTHER): Payer: Self-pay | Admitting: Family Medicine

## 2007-11-03 ENCOUNTER — Encounter: Payer: Self-pay | Admitting: *Deleted

## 2007-11-10 ENCOUNTER — Ambulatory Visit: Payer: Self-pay | Admitting: Family Medicine

## 2007-11-10 DIAGNOSIS — R413 Other amnesia: Secondary | ICD-10-CM

## 2007-11-17 ENCOUNTER — Telehealth (INDEPENDENT_AMBULATORY_CARE_PROVIDER_SITE_OTHER): Payer: Self-pay | Admitting: Family Medicine

## 2007-11-17 ENCOUNTER — Ambulatory Visit: Payer: Self-pay | Admitting: Family Medicine

## 2007-11-17 DIAGNOSIS — M545 Low back pain: Secondary | ICD-10-CM

## 2007-11-17 DIAGNOSIS — R159 Full incontinence of feces: Secondary | ICD-10-CM | POA: Insufficient documentation

## 2007-11-17 DIAGNOSIS — N329 Bladder disorder, unspecified: Secondary | ICD-10-CM | POA: Insufficient documentation

## 2007-11-17 LAB — CONVERTED CEMR LAB: Whiff Test: NEGATIVE

## 2007-11-19 ENCOUNTER — Ambulatory Visit: Payer: Self-pay | Admitting: Family Medicine

## 2007-11-19 ENCOUNTER — Telehealth: Payer: Self-pay | Admitting: *Deleted

## 2007-11-21 ENCOUNTER — Encounter: Admission: RE | Admit: 2007-11-21 | Discharge: 2007-11-21 | Payer: Self-pay | Admitting: Family Medicine

## 2007-11-22 ENCOUNTER — Encounter: Payer: Self-pay | Admitting: *Deleted

## 2007-12-07 ENCOUNTER — Emergency Department (HOSPITAL_COMMUNITY): Admission: EM | Admit: 2007-12-07 | Discharge: 2007-12-07 | Payer: Self-pay | Admitting: Emergency Medicine

## 2007-12-07 ENCOUNTER — Telehealth: Payer: Self-pay | Admitting: *Deleted

## 2007-12-08 ENCOUNTER — Telehealth: Payer: Self-pay | Admitting: *Deleted

## 2007-12-13 ENCOUNTER — Encounter: Payer: Self-pay | Admitting: Family Medicine

## 2007-12-21 ENCOUNTER — Encounter: Payer: Self-pay | Admitting: Emergency Medicine

## 2007-12-21 ENCOUNTER — Telehealth: Payer: Self-pay | Admitting: Family Medicine

## 2007-12-22 ENCOUNTER — Ambulatory Visit: Payer: Self-pay | Admitting: Family Medicine

## 2007-12-22 ENCOUNTER — Observation Stay (HOSPITAL_COMMUNITY): Admission: EM | Admit: 2007-12-22 | Discharge: 2007-12-23 | Payer: Self-pay | Admitting: Family Medicine

## 2007-12-22 ENCOUNTER — Encounter: Payer: Self-pay | Admitting: Family Medicine

## 2007-12-30 ENCOUNTER — Telehealth: Payer: Self-pay | Admitting: *Deleted

## 2008-01-01 DIAGNOSIS — D126 Benign neoplasm of colon, unspecified: Secondary | ICD-10-CM

## 2008-01-01 HISTORY — DX: Benign neoplasm of colon, unspecified: D12.6

## 2008-01-03 ENCOUNTER — Encounter: Payer: Self-pay | Admitting: Family Medicine

## 2008-01-03 ENCOUNTER — Ambulatory Visit: Payer: Self-pay | Admitting: Family Medicine

## 2008-01-03 ENCOUNTER — Telehealth: Payer: Self-pay | Admitting: *Deleted

## 2008-01-03 ENCOUNTER — Encounter: Payer: Self-pay | Admitting: *Deleted

## 2008-01-03 DIAGNOSIS — R197 Diarrhea, unspecified: Secondary | ICD-10-CM

## 2008-01-10 ENCOUNTER — Encounter (INDEPENDENT_AMBULATORY_CARE_PROVIDER_SITE_OTHER): Payer: Self-pay | Admitting: Family Medicine

## 2008-01-17 ENCOUNTER — Encounter: Payer: Self-pay | Admitting: Family Medicine

## 2008-01-25 ENCOUNTER — Encounter: Payer: Self-pay | Admitting: Family Medicine

## 2008-01-27 ENCOUNTER — Telehealth: Payer: Self-pay | Admitting: *Deleted

## 2008-01-28 ENCOUNTER — Telehealth: Payer: Self-pay | Admitting: *Deleted

## 2008-02-04 ENCOUNTER — Ambulatory Visit: Payer: Self-pay | Admitting: Family Medicine

## 2008-02-25 ENCOUNTER — Encounter: Payer: Self-pay | Admitting: Family Medicine

## 2008-02-28 ENCOUNTER — Telehealth: Payer: Self-pay | Admitting: Family Medicine

## 2008-03-06 ENCOUNTER — Telehealth: Payer: Self-pay | Admitting: Family Medicine

## 2008-03-10 ENCOUNTER — Ambulatory Visit: Payer: Self-pay | Admitting: Family Medicine

## 2008-03-15 ENCOUNTER — Ambulatory Visit: Payer: Self-pay | Admitting: Family Medicine

## 2008-04-07 ENCOUNTER — Telehealth: Payer: Self-pay | Admitting: Family Medicine

## 2008-06-13 ENCOUNTER — Telehealth: Payer: Self-pay | Admitting: *Deleted

## 2008-06-13 ENCOUNTER — Ambulatory Visit: Payer: Self-pay | Admitting: Family Medicine

## 2008-06-22 ENCOUNTER — Ambulatory Visit: Payer: Self-pay | Admitting: Family Medicine

## 2008-06-27 ENCOUNTER — Telehealth: Payer: Self-pay | Admitting: *Deleted

## 2008-06-27 ENCOUNTER — Encounter: Payer: Self-pay | Admitting: Family Medicine

## 2008-06-27 ENCOUNTER — Telehealth: Payer: Self-pay | Admitting: Family Medicine

## 2008-07-04 ENCOUNTER — Telehealth: Payer: Self-pay | Admitting: Family Medicine

## 2008-07-06 ENCOUNTER — Telehealth: Payer: Self-pay | Admitting: *Deleted

## 2008-07-07 ENCOUNTER — Telehealth: Payer: Self-pay | Admitting: Family Medicine

## 2008-08-14 ENCOUNTER — Telehealth: Payer: Self-pay | Admitting: Family Medicine

## 2008-09-08 ENCOUNTER — Ambulatory Visit: Payer: Self-pay | Admitting: Family Medicine

## 2008-10-09 ENCOUNTER — Telehealth: Payer: Self-pay | Admitting: Family Medicine

## 2008-10-10 ENCOUNTER — Telehealth: Payer: Self-pay | Admitting: Family Medicine

## 2008-10-10 ENCOUNTER — Ambulatory Visit: Payer: Self-pay | Admitting: Family Medicine

## 2008-10-10 DIAGNOSIS — H9209 Otalgia, unspecified ear: Secondary | ICD-10-CM | POA: Insufficient documentation

## 2008-10-13 ENCOUNTER — Telehealth: Payer: Self-pay | Admitting: *Deleted

## 2008-10-24 ENCOUNTER — Telehealth: Payer: Self-pay | Admitting: Family Medicine

## 2008-11-14 ENCOUNTER — Telehealth: Payer: Self-pay | Admitting: Family Medicine

## 2008-11-15 ENCOUNTER — Emergency Department (HOSPITAL_COMMUNITY): Admission: EM | Admit: 2008-11-15 | Discharge: 2008-11-15 | Payer: Self-pay | Admitting: Emergency Medicine

## 2008-12-07 ENCOUNTER — Ambulatory Visit: Payer: Self-pay | Admitting: Family Medicine

## 2008-12-07 DIAGNOSIS — M25559 Pain in unspecified hip: Secondary | ICD-10-CM

## 2008-12-14 ENCOUNTER — Ambulatory Visit: Payer: Self-pay | Admitting: Family Medicine

## 2008-12-14 ENCOUNTER — Encounter: Payer: Self-pay | Admitting: Family Medicine

## 2008-12-14 LAB — CONVERTED CEMR LAB
HCT: 35 % — ABNORMAL LOW (ref 36.0–46.0)
Hemoglobin: 11.3 g/dL — ABNORMAL LOW (ref 12.0–15.0)
WBC: 5.6 10*3/uL (ref 4.0–10.5)

## 2009-01-01 ENCOUNTER — Encounter: Payer: Self-pay | Admitting: Family Medicine

## 2009-01-01 ENCOUNTER — Ambulatory Visit: Payer: Self-pay | Admitting: Family Medicine

## 2009-01-02 LAB — CONVERTED CEMR LAB
ALT: 8 units/L (ref 0–35)
BUN: 34 mg/dL — ABNORMAL HIGH (ref 6–23)
CO2: 22 meq/L (ref 19–32)
Creatinine, Ser: 2.61 mg/dL — ABNORMAL HIGH (ref 0.40–1.20)
HDL: 39 mg/dL — ABNORMAL LOW (ref >39)
LDL Cholesterol: 82 mg/dL (ref 0–99)
Sodium: 140 meq/L (ref 135–145)
Total Protein: 6.9 g/dL (ref 6.0–8.3)
VLDL: 27 mg/dL (ref 0–40)

## 2009-01-10 ENCOUNTER — Ambulatory Visit: Payer: Self-pay | Admitting: Family Medicine

## 2009-01-15 ENCOUNTER — Encounter: Payer: Self-pay | Admitting: Family Medicine

## 2009-03-07 ENCOUNTER — Telehealth: Payer: Self-pay | Admitting: Family Medicine

## 2009-03-08 ENCOUNTER — Telehealth: Payer: Self-pay | Admitting: Family Medicine

## 2009-03-20 ENCOUNTER — Telehealth: Payer: Self-pay | Admitting: Family Medicine

## 2009-03-20 ENCOUNTER — Ambulatory Visit: Payer: Self-pay | Admitting: Family Medicine

## 2009-03-22 ENCOUNTER — Encounter: Payer: Self-pay | Admitting: Family Medicine

## 2009-03-23 ENCOUNTER — Encounter: Admission: RE | Admit: 2009-03-23 | Discharge: 2009-03-23 | Payer: Self-pay | Admitting: Family Medicine

## 2009-04-04 ENCOUNTER — Encounter: Admission: RE | Admit: 2009-04-04 | Discharge: 2009-04-04 | Payer: Self-pay | Admitting: Gastroenterology

## 2009-04-23 ENCOUNTER — Encounter: Payer: Self-pay | Admitting: Family Medicine

## 2009-07-23 ENCOUNTER — Telehealth: Payer: Self-pay | Admitting: Family Medicine

## 2009-08-13 ENCOUNTER — Telehealth: Payer: Self-pay | Admitting: Family Medicine

## 2009-08-20 ENCOUNTER — Ambulatory Visit: Payer: Self-pay | Admitting: Family Medicine

## 2009-08-20 DIAGNOSIS — F449 Dissociative and conversion disorder, unspecified: Secondary | ICD-10-CM | POA: Insufficient documentation

## 2009-12-07 ENCOUNTER — Ambulatory Visit: Payer: Self-pay | Admitting: Family Medicine

## 2009-12-24 DIAGNOSIS — IMO0001 Reserved for inherently not codable concepts without codable children: Secondary | ICD-10-CM

## 2009-12-24 HISTORY — DX: Reserved for inherently not codable concepts without codable children: IMO0001

## 2009-12-26 ENCOUNTER — Encounter: Payer: Self-pay | Admitting: Family Medicine

## 2010-01-17 ENCOUNTER — Encounter: Payer: Self-pay | Admitting: Family Medicine

## 2010-02-19 ENCOUNTER — Encounter: Payer: Self-pay | Admitting: Family Medicine

## 2010-03-22 ENCOUNTER — Encounter: Payer: Self-pay | Admitting: Family Medicine

## 2010-04-16 ENCOUNTER — Encounter: Payer: Self-pay | Admitting: Family Medicine

## 2010-04-29 ENCOUNTER — Telehealth: Payer: Self-pay | Admitting: *Deleted

## 2010-04-29 ENCOUNTER — Encounter: Payer: Self-pay | Admitting: Family Medicine

## 2010-05-01 ENCOUNTER — Ambulatory Visit: Payer: Self-pay | Admitting: Family Medicine

## 2010-05-01 ENCOUNTER — Encounter: Payer: Self-pay | Admitting: Family Medicine

## 2010-05-04 ENCOUNTER — Inpatient Hospital Stay (HOSPITAL_COMMUNITY): Admission: EM | Admit: 2010-05-04 | Discharge: 2010-05-06 | Payer: Self-pay | Admitting: Emergency Medicine

## 2010-05-04 ENCOUNTER — Encounter: Payer: Self-pay | Admitting: Family Medicine

## 2010-05-04 ENCOUNTER — Telehealth: Payer: Self-pay | Admitting: Family Medicine

## 2010-05-04 ENCOUNTER — Ambulatory Visit: Payer: Self-pay | Admitting: Cardiovascular Disease

## 2010-05-05 LAB — CONVERTED CEMR LAB
AST: 14 units/L (ref 0–37)
CO2: 20 meq/L (ref 19–32)
Cholesterol: 154 mg/dL (ref 0–200)
Creatinine, Ser: 3.98 mg/dL — ABNORMAL HIGH (ref 0.40–1.20)
HDL: 36 mg/dL — ABNORMAL LOW (ref >39)
Potassium: 4.5 meq/L (ref 3.5–5.3)
Sodium: 142 meq/L (ref 135–145)
Total Bilirubin: 0.3 mg/dL (ref 0.3–1.2)
Total CHOL/HDL Ratio: 4.3
Total Protein: 7 g/dL (ref 6.0–8.3)
VLDL: 22 mg/dL (ref 0–40)

## 2010-05-06 ENCOUNTER — Encounter: Payer: Self-pay | Admitting: Family Medicine

## 2010-05-07 ENCOUNTER — Ambulatory Visit: Payer: Self-pay

## 2010-05-10 ENCOUNTER — Encounter: Payer: Self-pay | Admitting: Family Medicine

## 2010-05-10 ENCOUNTER — Ambulatory Visit: Payer: Self-pay | Admitting: Family Medicine

## 2010-05-13 LAB — CONVERTED CEMR LAB
Calcium: 9.1 mg/dL (ref 8.4–10.5)
Creatinine, Ser: 4.37 mg/dL — ABNORMAL HIGH (ref 0.40–1.20)
Sodium: 140 meq/L (ref 135–145)

## 2010-05-25 ENCOUNTER — Telehealth: Payer: Self-pay | Admitting: Family Medicine

## 2010-06-04 ENCOUNTER — Telehealth: Payer: Self-pay | Admitting: Family Medicine

## 2010-06-07 ENCOUNTER — Inpatient Hospital Stay (HOSPITAL_COMMUNITY)
Admission: AD | Admit: 2010-06-07 | Discharge: 2010-06-13 | Payer: Self-pay | Source: Home / Self Care | Attending: Family Medicine | Admitting: Family Medicine

## 2010-06-07 ENCOUNTER — Ambulatory Visit
Admission: RE | Admit: 2010-06-07 | Discharge: 2010-06-07 | Payer: Self-pay | Source: Home / Self Care | Attending: Family Medicine | Admitting: Family Medicine

## 2010-06-07 DIAGNOSIS — R630 Anorexia: Secondary | ICD-10-CM | POA: Insufficient documentation

## 2010-06-11 ENCOUNTER — Encounter: Payer: Self-pay | Admitting: Family Medicine

## 2010-06-16 ENCOUNTER — Emergency Department (HOSPITAL_COMMUNITY)
Admission: EM | Admit: 2010-06-16 | Discharge: 2010-06-17 | Payer: Self-pay | Source: Home / Self Care | Admitting: Emergency Medicine

## 2010-06-16 ENCOUNTER — Encounter: Payer: Self-pay | Admitting: Family Medicine

## 2010-06-16 ENCOUNTER — Telehealth: Payer: Self-pay | Admitting: Family Medicine

## 2010-06-17 ENCOUNTER — Encounter (HOSPITAL_COMMUNITY)
Admission: RE | Admit: 2010-06-17 | Discharge: 2010-07-02 | Payer: Self-pay | Source: Home / Self Care | Attending: Nephrology | Admitting: Nephrology

## 2010-06-17 ENCOUNTER — Telehealth: Payer: Self-pay | Admitting: Family Medicine

## 2010-06-17 LAB — BASIC METABOLIC PANEL
BUN: 39 mg/dL — ABNORMAL HIGH (ref 6–23)
BUN: 33 mg/dL — ABNORMAL HIGH (ref 6–23)
BUN: 41 mg/dL — ABNORMAL HIGH (ref 6–23)
CO2: 20 mEq/L (ref 19–32)
CO2: 20 mEq/L (ref 19–32)
CO2: 21 mEq/L (ref 19–32)
Calcium: 8.7 mg/dL (ref 8.4–10.5)
Calcium: 9 mg/dL (ref 8.4–10.5)
Calcium: 9.1 mg/dL (ref 8.4–10.5)
Chloride: 110 mEq/L (ref 96–112)
Chloride: 114 mEq/L — ABNORMAL HIGH (ref 96–112)
Chloride: 109 mEq/L (ref 96–112)
Creatinine, Ser: 3.93 mg/dL — ABNORMAL HIGH (ref 0.4–1.2)
Creatinine, Ser: 4.48 mg/dL — ABNORMAL HIGH (ref 0.4–1.2)
Creatinine, Ser: 4.89 mg/dL — ABNORMAL HIGH (ref 0.4–1.2)
GFR calc Af Amer: 14 mL/min — ABNORMAL LOW (ref >60)
GFR calc Af Amer: 12 mL/min — ABNORMAL LOW (ref >60)
GFR calc Af Amer: 11 mL/min — ABNORMAL LOW (ref >60)
GFR calc non Af Amer: 11 mL/min — ABNORMAL LOW (ref >60)
GFR calc non Af Amer: 10 mL/min — ABNORMAL LOW (ref >60)
GFR calc non Af Amer: 9 mL/min — ABNORMAL LOW (ref >60)
Glucose, Bld: 99 mg/dL (ref 70–99)
Glucose, Bld: 89 mg/dL (ref 70–99)
Glucose, Bld: 95 mg/dL (ref 70–99)
Potassium: 3.6 mEq/L (ref 3.5–5.1)
Potassium: 4.2 mEq/L (ref 3.5–5.1)
Potassium: 4.7 mEq/L (ref 3.5–5.1)
Sodium: 138 mEq/L (ref 135–145)
Sodium: 142 mEq/L (ref 135–145)
Sodium: 138 mEq/L (ref 135–145)

## 2010-06-17 LAB — CBC
HCT: 32 % — ABNORMAL LOW (ref 36.0–46.0)
HCT: 28.1 % — ABNORMAL LOW (ref 36.0–46.0)
HCT: 29.2 % — ABNORMAL LOW (ref 36.0–46.0)
HCT: 31.7 % — ABNORMAL LOW (ref 36.0–46.0)
Hemoglobin: 10.2 g/dL — ABNORMAL LOW (ref 12.0–15.0)
Hemoglobin: 9 g/dL — ABNORMAL LOW (ref 12.0–15.0)
Hemoglobin: 9.2 g/dL — ABNORMAL LOW (ref 12.0–15.0)
Hemoglobin: 9.9 g/dL — ABNORMAL LOW (ref 12.0–15.0)
MCH: 26.6 pg (ref 26.0–34.0)
MCH: 26.8 pg (ref 26.0–34.0)
MCH: 26.5 pg (ref 26.0–34.0)
MCH: 26.9 pg (ref 26.0–34.0)
MCHC: 31.9 g/dL (ref 30.0–36.0)
MCHC: 32 g/dL (ref 30.0–36.0)
MCHC: 31.5 g/dL (ref 30.0–36.0)
MCHC: 31.2 g/dL (ref 30.0–36.0)
MCV: 83.3 fL (ref 78.0–100.0)
MCV: 83.6 fL (ref 78.0–100.0)
MCV: 84.1 fL (ref 78.0–100.0)
MCV: 86.1 fL (ref 78.0–100.0)
Platelets: 232 10*3/uL (ref 150–400)
Platelets: 192 10*3/uL (ref 150–400)
Platelets: 183 10*3/uL (ref 150–400)
Platelets: 269 10*3/uL (ref 150–400)
RBC: 3.84 MIL/uL — ABNORMAL LOW (ref 3.87–5.11)
RBC: 3.36 MIL/uL — ABNORMAL LOW (ref 3.87–5.11)
RBC: 3.47 MIL/uL — ABNORMAL LOW (ref 3.87–5.11)
RBC: 3.68 MIL/uL — ABNORMAL LOW (ref 3.87–5.11)
RDW: 16.6 % — ABNORMAL HIGH (ref 11.5–15.5)
RDW: 16.6 % — ABNORMAL HIGH (ref 11.5–15.5)
RDW: 16.9 % — ABNORMAL HIGH (ref 11.5–15.5)
RDW: 17.9 % — ABNORMAL HIGH (ref 11.5–15.5)
WBC: 9.3 10*3/uL (ref 4.0–10.5)
WBC: 7.2 10*3/uL (ref 4.0–10.5)
WBC: 6.3 10*3/uL (ref 4.0–10.5)
WBC: 10.4 10*3/uL (ref 4.0–10.5)

## 2010-06-17 LAB — DIFFERENTIAL
Basophils Absolute: 0 10*3/uL (ref 0.0–0.1)
Basophils Relative: 0 % (ref 0–1)
Eosinophils Absolute: 0.6 10*3/uL (ref 0.0–0.7)
Eosinophils Relative: 5 % (ref 0–5)
Lymphocytes Relative: 20 % (ref 12–46)
Lymphs Abs: 2.1 10*3/uL (ref 0.7–4.0)
Monocytes Absolute: 1 10*3/uL (ref 0.1–1.0)
Monocytes Relative: 10 % (ref 3–12)
Neutro Abs: 6.8 10*3/uL (ref 1.7–7.7)
Neutrophils Relative %: 65 % (ref 43–77)

## 2010-06-17 LAB — RENAL FUNCTION PANEL
Albumin: 2.7 g/dL — ABNORMAL LOW (ref 3.5–5.2)
Albumin: 2.3 g/dL — ABNORMAL LOW (ref 3.5–5.2)
Albumin: 2.4 g/dL — ABNORMAL LOW (ref 3.5–5.2)
BUN: 39 mg/dL — ABNORMAL HIGH (ref 6–23)
BUN: 32 mg/dL — ABNORMAL HIGH (ref 6–23)
BUN: 29 mg/dL — ABNORMAL HIGH (ref 6–23)
CO2: 24 mEq/L (ref 19–32)
CO2: 20 mEq/L (ref 19–32)
CO2: 21 mEq/L (ref 19–32)
Calcium: 8.9 mg/dL (ref 8.4–10.5)
Calcium: 8.6 mg/dL (ref 8.4–10.5)
Calcium: 8.6 mg/dL (ref 8.4–10.5)
Chloride: 112 mEq/L (ref 96–112)
Chloride: 112 mEq/L (ref 96–112)
Chloride: 115 mEq/L — ABNORMAL HIGH (ref 96–112)
Creatinine, Ser: 4.22 mg/dL — ABNORMAL HIGH (ref 0.4–1.2)
Creatinine, Ser: 4.01 mg/dL — ABNORMAL HIGH (ref 0.4–1.2)
Creatinine, Ser: 3.92 mg/dL — ABNORMAL HIGH (ref 0.4–1.2)
GFR calc Af Amer: 13 mL/min — ABNORMAL LOW (ref >60)
GFR calc Af Amer: 13 mL/min — ABNORMAL LOW (ref >60)
GFR calc Af Amer: 14 mL/min — ABNORMAL LOW (ref >60)
GFR calc non Af Amer: 10 mL/min — ABNORMAL LOW (ref >60)
GFR calc non Af Amer: 11 mL/min — ABNORMAL LOW (ref >60)
GFR calc non Af Amer: 11 mL/min — ABNORMAL LOW (ref >60)
Glucose, Bld: 111 mg/dL — ABNORMAL HIGH (ref 70–99)
Glucose, Bld: 136 mg/dL — ABNORMAL HIGH (ref 70–99)
Glucose, Bld: 95 mg/dL (ref 70–99)
Phosphorus: 4.3 mg/dL (ref 2.3–4.6)
Phosphorus: 4.1 mg/dL (ref 2.3–4.6)
Phosphorus: 3.8 mg/dL (ref 2.3–4.6)
Potassium: 3.9 mEq/L (ref 3.5–5.1)
Potassium: 4 mEq/L (ref 3.5–5.1)
Potassium: 4 mEq/L (ref 3.5–5.1)
Sodium: 142 mEq/L (ref 135–145)
Sodium: 140 mEq/L (ref 135–145)
Sodium: 141 mEq/L (ref 135–145)

## 2010-06-17 LAB — IRON AND TIBC
Iron: 41 ug/dL — ABNORMAL LOW (ref 42–135)
Saturation Ratios: 18 % — ABNORMAL LOW (ref 20–55)
TIBC: 233 ug/dL — ABNORMAL LOW (ref 250–470)
UIBC: 192 ug/dL

## 2010-06-17 LAB — SEDIMENTATION RATE: Sed Rate: 80 mm/hr — ABNORMAL HIGH (ref 0–22)

## 2010-06-17 LAB — FERRITIN: Ferritin: 267 ng/mL (ref 10–291)

## 2010-06-21 NOTE — Consult Note (Signed)
[name], [name] NO.:  [id number]  MEDICAL RECORD NO.:  [id number]           PATIENT TYPE:  LOCATION:                                 FACILITY:  PHYSICIAN:  Pearlean Brownie, M.D.DATE OF BIRTH:  [birth year]  DATE OF CONSULTATION: DATE OF DISCHARGE:                                CONSULTATION   PRIMARY CARE PROVIDER:  Jamie Brookes, MD, [facility].  HISTORY OF PRESENT ILLNESS:  The patient states that her right hand swelled up and started hurting yesterday.  She says it feels like a gout attack.  She says the pain is severe, 10/10, so she came into the emergency room.  She says she was initially fine when she went home from the hospital 4 days after placement of an AV fistula graft in her right arm, dialysis catheter in her right internal jugular vein.  The patient says the pain is 10/10.  She cannot make a fist or use her right hand for anything secondary to the pain and swelling.  The patient denies any pain, drainage, erythema, swelling at the surgery site.  All of the pain and swelling is just in her hand.  REVIEW OF SYSTEMS:  The patient denies any fevers, nausea, vomiting, urinary symptoms, dizziness, chest pain, shortness of breath.  CURRENT MEDICATIONS: 1. Rocaltrol 0.25 mg p.o. daily. 2. Clonidine 0.1 mg p.o. t.i.d. 3. Hydralazine 15 mg p.o. t.i.d. 4. Amitiza 8 mcg p.o. daily. 5. Reglan 5 mg p.o. q.a.c. and at bedtime. 6. Nortriptyline 10 mg p.o. at bedtime. 7. Oxycodone/acetaminophen 5/325 p.o. b.i.d. p.r.n. pain 8. Allopurinol 100 mg p.o. daily. 9. Aspirin 81 mg p.o. daily. 10.Lipitor 40 mg p.o. daily. 11.Metoprolol tartrate 40 mg p.o. b.i.d. 12.Amlodipine 10 mg p.o. daily. 13.Prilosec 20 mg p.o. daily. 14.Tramadol 50 mg 1-2 tablets p.o. b.i.d. p.r.n. pain 15.Zoloft 100 mg 2 tablets p.o. daily.  Past medical history significant for: 1. Chronic kidney disease stage IV, preparing for hemodialysis. 2.  Hypertension. 3. Gallop. 4. Gastroparesis. 5. Restless legs syndrome. 6. Irritable bowel syndrome.  FAMILY HISTORY:  Positive for breast cancer, thyroid cancer, congestive heart failure, CVA.  SOCIAL HISTORY:  The patient denies tobacco, alcohol, or drug use.  PHYSICAL EXAMINATION:  VITAL SIGNS:  Temperature 98.5, pulse 73, respiratory rate 16, blood pressure 147/65, pulse ox 100% on room air. GENERAL:  The patient is well developed, well nourished in no acute distress.  Alert and appropriate, cooperative throughout exam. MOUTH:  Oral mucosa and oropharynx without lesions or exudates.  Poor dentition. NECK:  No deformities, masses, or tenderness noted. LUNGS:  Normal respiratory effort.  Chest expand symmetrically.  Lungs are clear to auscultation.  No crackles or wheezes. HEART:  Regular rate and rhythm.  No murmurs, rubs, or gallops. ABDOMEN:  Bowel sounds positive.  Abdomen is soft, nontender without masses or organomegaly. MUSCULOSKELETAL:  Range of motion of right hand is limited secondary to pain and swelling.  Pulses, there are 2+ pulses in all four extremities. EXTREMITIES:  Right hand with nonpitting edema and warm to the touch, very painful to touch.  There is no erythema,  warmth, pain, or drainage around the right antecubital fossa where the fistula incision is healing well.  Over the fistula, there is a palpable thrill and an audible bruit on auscultation. NEUROLOGIC:  The patient is alert and oriented x3.  LABORATORY DATA:  CBC with differential, white blood cell count 10.4, hemoglobin 9.9, hematocrit 31.7, platelets 269,000, normal differential. Basic metabolic panel, sodium 138, potassium 4.7, chloride 109, CO2 21, glucose 95, BUN 41, creatinine 4.89, calcium 9.1.  ASSESSMENT AND PLAN:  This is a 70 year old female with multiple medical problems who recently had a right atrioventricular fistula placed about 5 days ago who presents with right hand swelling and  pain. 1. Right hand swelling and pain.  The swelling is only in the hand and     the patient reports her symptoms are consistent with previous gout     flares.  There is a palpable femoral and audible bruit on     auscultation of the fistula graft.  No signs of fistula stenosis or     thrombosis.  We will treat the patient for a gout flare with a     prednisone taper and pain control.  We will ask the patient to     follow up in the [facility] in the morning if she has     not improved. 2. Chronic kidney disease stage IV.  The patient is preparing for     hemodialysis and has a dialysis catheter placed and a new right AV     fistula.  We will advise she keep her scheduled followup     appointment with Vascular Surgery and Nephrology.  Continue new     medications started by Nephrology during recent hospitalization. 3. Hypertension.  Continue current home regimen. 4. Gastroparesis.  Continue remotely dosed Reglan. 5. Fluids, electrolytes, nutrition, gastrointestinal.  Continue renal     diet.  The patient was given instructions for this diet during her     recent hospitalization.  Continue proton pump inhibitor. 6. Disposition.  The patient will be discharged home with a prednisone     taper and follow up with Dr. Clotilde Dieter at     [facility] as already scheduled on June 25, 2010, at 1:45     p.m.  She may call the office if she does not improve with the     prednisone and needs to be seen sooner.  She is also to follow up     with Dr. Caryn Section of Nephrology on June 25, 2010, at 9 a.m.    ______________________________ [name], MD   ______________________________ Pearlean Brownie, M.D.    CR/MEDQ  D:  06/17/2010  T:  06/17/2010  Job:  [id number]  Electronically Signed by [name] MD on 06/18/2010 10:18:36 PM Electronically Signed by Pearlean Brownie M.D. on 06/21/2010 03:11:52 PM

## 2010-06-21 NOTE — Discharge Summary (Signed)
[name], [name] NO.:  [id number]  MEDICAL RECORD NO.:  [id number]          PATIENT TYPE:  INP  LOCATION:  6713                         FACILITY:  [facility]  PHYSICIAN:  Pearlean Brownie, M.D.DATE OF BIRTH:  1941-02-17  DATE OF ADMISSION:  06/07/2010 DATE OF DISCHARGE:  06/13/2010                              DISCHARGE SUMMARY   PRIMARY CARE PROVIDER:  Jamie Brookes, MD, at [facility].  DISCHARGE DIAGNOSES: 1. Chronic kidney disease, stage IV, in need of hemodialysis. 2. Gastroparesis. 3. Hypertension. 4. Gout. 5. Restless legs syndrome.  CONSULTANT:  Nephrology and Vascular Surgery.  DISCHARGE MEDICATIONS: 1. Rocaltrol 0.25 mg p.o. daily. 2. Clonidine 0.1 mg p.o. t.i.d. 3. Hydralazine 50 mg p.o. t.i.d. 4. Amitiza 8 mcg p.o. daily. 5. Reglan 5 mg p.o. q.a.c. and nightly. 6. Nortriptyline 10 mg p.o. nightly. 7. Oxycodone and acetaminophen 5/325 one tablet p.o. b.i.d. p.r.n.     pain from surgery. 8. Allopurinol 100 mg p.o. daily. 9. Aspirin 81 mg p.o. daily. 10.Lipitor 40 mg p.o. daily. 11.Metoprolol tartrate 100 mg p.o. b.i.d. 12.Amlodipine 10 mg p.o. daily. 13.Prilosec 20 mg p.o. daily. 14.Tramadol 50 mg 2 tablets p.o. b.i.d. p.r.n. pain. 15.Zoloft 100 mg 2 tablets p.o. daily.  PERTINENT LABORATORY VALUES:  On June 07, 2010, CBC:  White blood cell count 9.3, hemoglobin 10.2, hematocrit 32.0, and platelet 232.  Renal function panel:  Sodium 142, potassium 3.9, chloride 112, CO2 of 24, glucose 111, BUN 39, creatinine 4.22, albumin 2.9, calcium 8.9, and phosphorus 4.3.  on June 09, 2010, ESR was elevated at 80.  On June 10, 2010, ferritin level was within normal limits at 267 and iron panel with iron level of 41, TIBC low at 233, percent saturation low at 18%, and UIBC is 192.  On June 12, 2010, CBC, no differential:  White blood cell count 6.3, hemoglobin 9.2, hematocrit 29.2, and platelets 183.  Basic metabolic  panel:  Sodium 142, potassium 4.2, chloride 144, CO2 of 20, glucose 89, BUN 33, creatinine 4.48, and calcium 9.0.  RADIOLOGY:  MRI/MRA of brain without contrast showed internal carotid arteries within normal limits bilaterally.  No definite anterior communicating artery is identified.  PCA and MCA branch vessels are within normal limits.  The left lower tubal artery is slightly dominant to the right.  The PICA origins are visualized and within normal limits bilaterally.  The basal artery is normal.  Both posterior and cerebral arteries originate from the basilar tip.  Posterior communicating artery is within normal limits.  No acute or focal intracranial abnormality does explain symptoms.  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  Interval sinus surgery, empty sella, Chiari 1 malformation.  On June 10, 2010, a gastric emptying study showed delayed solid-phase gastric emptying this suggesting gastroparesis.  On June 12, 2010, a portable chest x-ray showed dialysis catheter in place.  No pneumothorax or acute abnormality.  On June 12, 2010, a fluoro-guided CV line fluoroscopy was utilized by requesting physician.  PROCEDURES:  The patient had a right subclavian PermCath dialysis catheter placed as well as a right antecubital  fossa graft fistula placed.  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 1. Renal:  The patient with elevated creatinine on admission that did     not improve with IV hydration.  The Nephrology team was consulted     and the patient was started on a number of new medications for     kidney disease including Rocaltrol and Amitiza.  Vascular Surgery     was consulted and the patient had a right  forearm graft placed as     well as a dialysis catheter placed.  In preparation for     hemodialysis, the patient will follow up with Dr. Caryn Section at [facility]. 2. Nausea:  The patient had had decreased appetite and chronic nausea     for some time.  A gastric emptying study was performed that     identified gastroparesis.  The patient was started on a renal dose     of Reglan and her symptoms improved. 3. Weakness and headache:  The patient was likely dehydrated on     admission.  She was started on IV fluids and her symptoms improved.     Her headache continued for several days.  There was concern for     acute brain abnormality, however, MRI of her head was negative.     There was also concern for migraine-type headaches.  The patient     was started on nortriptyline for migraines.  Her headaches improved     and she was discharged home with nortriptyline. 4. Hypertension:  The patient had poorly controlled hypertension on     admission and during this hospitalization.  Hydralazine and     clonidine were added to her blood pressure regimen for the 24     hours.  Prior to discharge, her blood pressures were better     controlled with systolics in the 120s and 130s.  She was discharged     home with prescriptions for these medications. 5. Gout:  The patient is on allopurinol for gout prophylaxis.     Allopurinol was held on admission for concern of worsening chronic     kidney disease, however, when Nephrology was consulted they felt it     was fine to continue allopurinol, so she was discharged on her     prior home dose.  FOLLOWUP ISSUES/RECOMMENDATIONS:  The patient is to follow up with Dr. Caryn Section of Nephrology on June 25, 2010, at 9 a.m. for decision about when to start hemodialysis.  The patient was also given a handout about renal diet that she was advised to follow.  The patient is to follow up with Dr. Clotilde Dieter at [facility] also on June 25, 2010, at 1:45 p.m.  The patient was discharged home in stable medical condition.    ______________________________ [name], MD   ______________________________ Pearlean Brownie, M.D.    CR/MEDQ  D:  06/13/2010  T:  06/14/2010  Job:  [id number]  Electronically Signed by [name] MD on 06/14/2010 03:35:41 PM Electronically Signed by Pearlean Brownie M.D. on 06/21/2010 03:11:39 PM

## 2010-06-23 ENCOUNTER — Encounter: Payer: Self-pay | Admitting: Gastroenterology

## 2010-06-23 ENCOUNTER — Encounter: Payer: Self-pay | Admitting: Family Medicine

## 2010-06-25 ENCOUNTER — Encounter: Payer: Self-pay | Admitting: Family Medicine

## 2010-06-25 ENCOUNTER — Ambulatory Visit: Admission: RE | Admit: 2010-06-25 | Discharge: 2010-06-25 | Payer: Self-pay | Source: Home / Self Care

## 2010-07-01 ENCOUNTER — Ambulatory Visit (HOSPITAL_COMMUNITY)
Admission: RE | Admit: 2010-07-01 | Discharge: 2010-07-01 | Payer: Self-pay | Source: Home / Self Care | Attending: Vascular Surgery | Admitting: Vascular Surgery

## 2010-07-02 NOTE — Progress Notes (Signed)
  Phone Note Call from Patient   Caller: Patient Summary of Call: Patient called from home, was out shopping when she became nauseous, came home and became very dizzy.  Unable to get out of chair due to vertiginous symptoms.  Has been having some nausea for past several days on further questioning, no vomiting.  Lives at home with her daughter.  Awoke with very sharp pains in her head for 3 days, worse on Left side, not relieved with anything.  Hx/o frontal craniotomy for sinusitis.   Patient recently had lab work showing elevated Cr to 3.98, last know Cr was 2.6 in 12/2008.  Due to increasing symptoms and fact patient cannot get out of her chair on her own, recommended she come to ED.  Possiblity for uremia.  Patient is producing some urine.  Patient agreed, states she would rather have her daughter bring her than call EMS.  Will likely need admission.   Initial call taken by: Renold Don MD,  May 04, 2010 1:03 PM

## 2010-07-02 NOTE — Initial Assessments (Signed)
Summary: R2 Addendum   Primary Care Provider:  Jamie Brookes MD   History of Present Illness: 70 yo F w/ PMH significant for CKD stage 4, HTN, HLD who presents with nause and general malaise for past several months.  Reason for coming to ED today was due to 1 day history of dizziness.  Patient was trying to clean her oven-top today when she lost her balance due to dizziness and fell back against the opposite wall.  Does report some other episodes of weakness and general lightheadedness over the past several months.  Of note, she was called with lab results of elevated creatinine yesterday after lab tests in the office showed Cr 3.98.  Called Emergency line, spoke with me at that time, I told her to come in due to her symptoms.  Called for admission by ED physician.  Denies any chest pain, shortness of breath, palpitations, LOC currently.    Please see R1 note for PMH, medicaions, family history, social history, and ROS.    Allergies: 1)  ! Lyrica (Pregabalin) 2)  Sulfamethoxazole (Sulfamethoxazole) 3)  Codeine Phosphate (Codeine Phosphate) 4)  Penicillin G Potassium (Penicillin G Potassium)  Physical Exam  Additional Exam:  Gen: Elderly woman lying in bed in NAD, alert and cooperative throughout exam HEENT: NCAT, PERRL, EOMI, MMM. Ear canals clear.   Neck: No LAN, thyromegaly, masses, bruits CV: Regular rate and rhythm, no murmurs, rubs, or gallops. Lungs: Normal respiratory effort. CTAB no wheezes, rales, or rhonchi noted Abd: soft, nondistended, nontender.  BS+ and normoactive.  No hepatosplenomegaly.  No masses. Ext: warm and well perfused.  No edema or cyanosis. Skin: No rashes or lesions on visible skin. Neuro:  Alert and oriented x 3.  CN II-XII intact.  Sensation intact throughout upper and lower extremities bilaterally.  Motor function intact throughout.  No focal deficits.  DTR's +2 bilateral ankle jerk and patellar reflexes.  Patient deferred gait exam.   Musculoskeletal:   Strength 5/5 bilateral upper and lower extremities Psych:  No suicidal or homicidal ideations.  Not depressed or anxious appearing.      Impression & Recommendations:  Problem # 1:  Acute on chronic kidney disease Likely etiology for nausea and dizziness as well as general malaise for past several months.  BUN 45, so likely prerenal etiology.  Plan to provide maintenance fluids and follow Cr.  Patient producing urine.  Potassium within normal limits.  Provide symptomatic relief.  No deficits on neuro exam, less likely to be neurologic process.  Will cont to monitor.  Baseline Cr appears to be 2-2.2.  Elevated 3.98 on 11/30, 4.1 today in clinic.  Also plan to check UA/urine culture, check FENa in AM.  Also plan nephrology consult in AM.    Problem # 2:  HYPERTENSION, BENIGN SYSTEMIC (ICD-401.1) Continue home blood pressure medications, holding Diovan.  Call parameters in place.  Her updated medication list for this problem includes:    Norvasc 10 Mg Tabs (Amlodipine besylate) .Marland Kitchen... 1 tab by mouth daily    Metoprolol Tartrate 100 Mg Tabs (Metoprolol tartrate) .Marland Kitchen... Take 1 tablet by mouth twice a day for blood pressure.    Diovan 80 Mg Tabs (Valsartan) .Marland Kitchen... Take one pill daily  Problem # 3:  HYPERCHOLESTEROLEMIA (ICD-272.0) Continue home medications.  Her updated medication list for this problem includes:    Lipitor 40 Mg Tabs (Atorvastatin calcium) .Marland Kitchen... 1 tab daily  Problem # 4:  GASTROESOPHAGEAL REFLUX, NO ESOPHAGITIS (ICD-530.81) Continue Protonix in house.   Her  updated medication list for this problem includes:    Kls Omeprazole 20 Mg Tbec (Omeprazole) .Marland Kitchen... 1 tab po qday  Problem # 5:  FEN/GI Renal diet.  Plan to follow electrolytes.    Problem # 6:  PPX heparin 5000 units   Problem # 7:  Dispo pending further workup for renal failure  Complete Medication List: 1)  Aspirin 81 Mg Chw Tab (Aspirin) .... Take one (1) tablet by mouth daily 2)  Norvasc 10 Mg Tabs (Amlodipine  besylate) .Marland Kitchen.. 1 tab by mouth daily 3)  Lipitor 40 Mg Tabs (Atorvastatin calcium) .Marland Kitchen.. 1 tab daily 4)  Metoprolol Tartrate 100 Mg Tabs (Metoprolol tartrate) .... Take 1 tablet by mouth twice a day for blood pressure. 5)  Sertraline Hcl 100 Mg Tabs (Sertraline hcl) .... Take 1 tablet by mouth once a day 6)  Tramadol Hcl 50 Mg Tabs (Tramadol hcl) .... Take two pills in the am and 1 pill in the pm 7)  Allopurinol 100 Mg Tabs (Allopurinol) .... 1/2 daily. 8)  Kls Omeprazole 20 Mg Tbec (Omeprazole) .Marland Kitchen.. 1 tab po qday 9)  Flonase 50 Mcg/act Susp (Fluticasone propionate) .... Two sprays per nostril daily. 10)  Hydrocortisone 2.5 % Oint (Hydrocortisone) .... Apply ointment to the skin 2 times a day. 11)  Auralgan Soln (Acetic ac-antipy-benzo-polycos) .... 2-4 drops in ear up to qid as needed for pain - disp 1 bottle 12)  Diovan 80 Mg Tabs (Valsartan) .... Take one pill daily 13)  Flexeril 5 Mg Tabs (Cyclobenzaprine hcl) .... Take one pill prior to going to bed. 14)  Oxybutynin Chloride 5 Mg Tabs (Oxybutynin chloride) .... Take one pill twice a day (morning and evening) 15)  Nitrostat 0.4 Mg Subl (Nitroglycerin) .... Take 1pill every 5 minutes x 3 for chest pain. if you have to take this please call my office, if no relief call 911

## 2010-07-02 NOTE — Assessment & Plan Note (Signed)
Summary: atypical chest pain   Vital Signs:  Patient profile:   70 year old female Height:      62.5 inches Weight:      183.4 pounds BMI:     33.13 Temp:     98.4 degrees F oral Pulse rate:   62 / minute BP sitting:   143 / 67  (left arm) Cuff size:   large  Vitals Entered By: Gladstone Pih (December 07, 2009 2:59 PM) CC: chest pains Is Patient Diabetic? No Pain Assessment Patient in pain? yes     Location: legs Intensity: 6 Type: aching Onset of pain  Chronic   Primary Care Provider:  Jamie Brookes MD  CC:  chest pains.  History of Present Illness: Chest pains: Pt is having some unusual chest pains. She says that on Tues and Wed (4 and 3 days ago) she had some "pinching" feelings in her left side of her chest. She had been talking on the phone to an old friend whom she had not seen in many years. She was very excited about getting to talk to her friend and said that this brought on the sharp pinching pain on the left side of her chest. She had minimal arm pain. She has had a few more episodes of this in the last few days.It happens when she is at rest lying down as well. She says that she is also having "a hard time catching her breath"  when she lays down. She feels fine today in the office. Only occasionally has these episodes. Pt has h/o hiatal hernia and is symptomatic from time to time as well.   Habits & Providers  Alcohol-Tobacco-Diet     Tobacco Status: current     Tobacco Counseling: to quit use of tobacco products     Cigarette Packs/Day: 1.0  Current Medications (verified): 1)  Aspirin 81 Mg Chw Tab (Aspirin) .... Take One (1) Tablet By Mouth Daily 2)  Norvasc 10 Mg  Tabs (Amlodipine Besylate) .Marland Kitchen.. 1 Tab By Mouth Daily 3)  Lipitor 40 Mg  Tabs (Atorvastatin Calcium) .Marland Kitchen.. 1 Tab Daily 4)  Metoprolol Tartrate 100 Mg Tabs (Metoprolol Tartrate) .... Take 1 Tablet By Mouth Twice A Day For Blood Pressure. 5)  Sertraline Hcl 100 Mg Tabs (Sertraline Hcl) .... Take 1  Tablet By Mouth Once A Day 6)  Tramadol Hcl 50 Mg Tabs (Tramadol Hcl) .... Take Two Pills in The Am and 1 Pill in The Pm 7)  Allopurinol 100 Mg  Tabs (Allopurinol) .... 1/2 Daily. 8)  Kls Omeprazole 20 Mg Tbec (Omeprazole) .Marland Kitchen.. 1 Tab Po Qday 9)  Flonase 50 Mcg/act Susp (Fluticasone Propionate) .... Two Sprays Per Nostril Daily. 10)  Hydrocortisone 2.5 % Oint (Hydrocortisone) .... Apply Ointment To The Skin 2 Times A Day. 11)  Auralgan  Soln (Acetic Ac-Antipy-Benzo-Polycos) .... 2-4 Drops in Ear Up To Qid As Needed For Pain - Disp 1 Bottle 12)  Diovan 80 Mg Tabs (Valsartan) .... Take One Pill Daily 13)  Flexeril 5 Mg Tabs (Cyclobenzaprine Hcl) .... Take One Pill Prior To Going To Bed. 14)  Oxybutynin Chloride 5 Mg Tabs (Oxybutynin Chloride) .... Take One Pill Twice A Day (Morning and Evening) 15)  Nitrostat 0.4 Mg Subl (Nitroglycerin) .... Take 1pill Every 5 Minutes X 3 For Chest Pain. If You Have To Take This Please Call My Office, If No Relief Call 911  Allergies (verified): 1)  ! Lyrica (Pregabalin) 2)  Sulfamethoxazole (Sulfamethoxazole) 3)  Codeine Phosphate (  Codeine Phosphate) 4)  Penicillin G Potassium (Penicillin G Potassium)  Social History: Packs/Day:  1.0  Review of Systems        vitals reviewed and pertinent negatives and positives seen in HPI   Physical Exam  General:  Well-developed,well-nourished,in no acute distress; alert,appropriate and cooperative throughout examination Lungs:  Normal respiratory effort, chest expands symmetrically. Lungs are clear to auscultation, no crackles or wheezes. Heart:  Normal rate and regular rhythm. S1 and S2 normal without gallop, murmur, click, rub or other extra sounds. Skin:  Intact without suspicious lesions or rashes Psych:  Cognition and judgment appear intact. Alert and cooperative with normal attention span and concentration. No apparent delusions, illusions, hallucinations   Impression & Recommendations:  Problem # 1:   CHEST PAIN, ATYPICAL (ICD-786.59) Assessment Deteriorated Pt has had atypical chest pain in the past but it has been 5-6 years since she says she has seen her cardiologist. She is having atypical chest pains (at rest, with excitement, "pinching", etc...see HPI). She has a hiatal hernia that may be causing some of her symptoms but since her chest pain is unusual I would like to send her to a cardiologist. Pt wants to see a new cardiologist.   Orders: Cardiology Referral (Cardiology) [facility]- Est Level  3 (45409)  Complete Medication List: 1)  Aspirin 81 Mg Chw Tab (Aspirin) .... Take one (1) tablet by mouth daily 2)  Norvasc 10 Mg Tabs (Amlodipine besylate) .Marland Kitchen.. 1 tab by mouth daily 3)  Lipitor 40 Mg Tabs (Atorvastatin calcium) .Marland Kitchen.. 1 tab daily 4)  Metoprolol Tartrate 100 Mg Tabs (Metoprolol tartrate) .... Take 1 tablet by mouth twice a day for blood pressure. 5)  Sertraline Hcl 100 Mg Tabs (Sertraline hcl) .... Take 1 tablet by mouth once a day 6)  Tramadol Hcl 50 Mg Tabs (Tramadol hcl) .... Take two pills in the am and 1 pill in the pm 7)  Allopurinol 100 Mg Tabs (Allopurinol) .... 1/2 daily. 8)  Kls Omeprazole 20 Mg Tbec (Omeprazole) .Marland Kitchen.. 1 tab po qday 9)  Flonase 50 Mcg/act Susp (Fluticasone propionate) .... Two sprays per nostril daily. 10)  Hydrocortisone 2.5 % Oint (Hydrocortisone) .... Apply ointment to the skin 2 times a day. 11)  Auralgan Soln (Acetic ac-antipy-benzo-polycos) .... 2-4 drops in ear up to qid as needed for pain - disp 1 bottle 12)  Diovan 80 Mg Tabs (Valsartan) .... Take one pill daily 13)  Flexeril 5 Mg Tabs (Cyclobenzaprine hcl) .... Take one pill prior to going to bed. 14)  Oxybutynin Chloride 5 Mg Tabs (Oxybutynin chloride) .... Take one pill twice a day (morning and evening) 15)  Nitrostat 0.4 Mg Subl (Nitroglycerin) .... Take 1pill every 5 minutes x 3 for chest pain. if you have to take this please call my office, if no relief call 911  Patient Instructions: 1)  We  will let you know about the referral to cardiology. 2)  Call back after your cardiology referral if you are still having a hard time eating.  Prescriptions: TRAMADOL HCL 50 MG TABS (TRAMADOL HCL) take two pills in the am and 1 pill in the pm  #120 x 11   Entered and Authorized by:   Jamie Brookes MD   Signed by:   Jamie Brookes MD on 12/07/2009   Method used:   Electronically to        [facility] (retail)       [address]  [place], [place]  [address]       Ph: [phone]       Fax: [phone]   RxID:   [id number] NITROSTAT 0.4 MG SUBL (NITROGLYCERIN) take 1pill every 5 minutes x 3 for chest pain. If you have to take this please call my office, if no relief call 911  #1 bottle x 11   Entered and Authorized by:   Jamie Brookes MD   Signed by:   Jamie Brookes MD on 12/07/2009   Method used:   Electronically to        [facility] (retail)       [address]       [place], [place]  [address]       Ph: [phone]       Fax: [phone]   RxID:   [id number]

## 2010-07-02 NOTE — Progress Notes (Signed)
Summary: schedule lab and follow up appt  Phone Note Outgoing Call   Call placed by: Loralee Pacas CMA,  April 29, 2010 1:52 PM Summary of Call: pt will need to come in and have a fasting lipid panel please schedule a lab visit and follow up appt per dr. Clotilde Dieter. Initial call taken by: Loralee Pacas CMA,  April 29, 2010 1:54 PM  Follow-up for Phone Call        scheduled appt for f/u (12/6) and labs(11/30) for pt  Follow-up by: Loralee Pacas CMA,  April 30, 2010 2:44 PM     Appended Document: schedule lab and follow up appt    Clinical Lists Changes  Orders: Added new Test order of Lipid-[facility] [phone]) - Signed Added new Test order of Comp Met-[facility] [phone]) - Signed

## 2010-07-02 NOTE — Miscellaneous (Signed)
Summary: handicap placard  pt dropped off form to be completed, gave to Terese Door for any clinical completion. Knox Royalty  April 16, 2010 3:44 PM    Handicap Placard form placed in Dr. Lorelee Market box for completion/signature...Marland KitchenMarland KitchenMarland Kitchen Terese Door  April 16, 2010 3:49 PM  Filled out and signed and placed on [employer]. at 8:55 am. Jamie Brookes MD  April 18, 2010 9:06 AM

## 2010-07-02 NOTE — Progress Notes (Signed)
Summary: Sertraline refill  Phone Note Refill Request Call back at Home Phone [phone] Message from:  Patient  Refills Requested: Medication #1:  SERTRALINE HCL 100 MG TABS Take 1 tablet by mouth once a day [facility] states that they have been calling for a month - pt has been out  Initial call taken by: De Nurse,  July 23, 2009 9:05 AM  Follow-up for Phone Call        this is my first "call" about it. Do they have the correct office/number? Follow-up by: Jamie Brookes MD,  July 23, 2009 6:54 PM    Prescriptions: SERTRALINE HCL 100 MG TABS (SERTRALINE HCL) Take 1 tablet by mouth once a day  #90 x 3   Entered and Authorized by:   Jamie Brookes MD   Signed by:   Jamie Brookes MD on 07/23/2009   Method used:   Electronically to        [facility] (retail)       [address]       [place], [place]  [address]       Ph: [phone]       Fax: [phone]   RxID:   [id number]

## 2010-07-02 NOTE — Miscellaneous (Signed)
Summary: Omeprazole refill  Clinical Lists Changes  Medications: Rx of KLS OMEPRAZOLE 20 MG TBEC (OMEPRAZOLE) 1 tab Po qday;  #34 x 11;  Signed;  Entered by: Jamie Brookes MD;  Authorized by: Jamie Brookes MD;  Method used: Electronically to [facility] [address] [place], [place], [place]  [address], Ph: [phone], Fax: [phone]    Prescriptions: KLS OMEPRAZOLE 20 MG TBEC (OMEPRAZOLE) 1 tab Po qday  #34 x 11   Entered and Authorized by:   Jamie Brookes MD   Signed by:   Jamie Brookes MD on 03/22/2010   Method used:   Electronically to        [facility] (retail)       [address]       [place], [place]  [address]       Ph: [phone]       Fax: [phone]   RxID:   [id number]

## 2010-07-02 NOTE — Miscellaneous (Signed)
Summary: QI project: Stage 4 CKD needs to see Renal if not already, flagd  Clinical Lists Changes  Problems: Changed problem from CHRONIC KIDNEY DISEASE STAGE II (MILD) (ICD-585.2) to CHRONIC KIDNEY DISEASE STAGE IV (SEVERE) (ICD-585.4)  Based off of GFR calculation her GFR is 24 which moves her into the Stage 4 catagory.

## 2010-07-02 NOTE — Assessment & Plan Note (Signed)
Summary: Metoprol & Lipitor refill  Prescriptions: LIPITOR 40 MG  TABS (ATORVASTATIN CALCIUM) 1 tab daily  #30 x 6   Entered and Authorized by:   Jamie Brookes MD   Signed by:   Jamie Brookes MD on 02/19/2010   Method used:   Electronically to        [facility] (retail)       [address]       [place], [place]  [address]       Ph: [phone]       Fax: [phone]   RxID:   [id number] METOPROLOL TARTRATE 100 MG TABS (METOPROLOL TARTRATE) Take 1 tablet by mouth twice a day for blood pressure.  #68 x 6   Entered and Authorized by:   Jamie Brookes MD   Signed by:   Jamie Brookes MD on 02/19/2010   Method used:   Electronically to        [facility] (retail)       [address]       [place], [place]  [address]       Ph: [phone]       Fax: [phone]   RxID:   [id number]

## 2010-07-02 NOTE — Progress Notes (Signed)
Summary: Allopurinol and Tramadol Refill  Phone Note Refill Request Call back at Home Phone [phone]   Refills Requested: Medication #1:  TRAMADOL HCL 50 MG TABS take two pills in the am and 1 pill in the pm  Medication #2:  ALLOPURINOL 100 MG  TABS 1/2 daily. pt [facility] to [facility]  Initial call taken by: Knox Royalty,  August 13, 2009 9:29 AM  Follow-up for Phone Call        To PCP Follow-up by: Gladstone Pih,  August 13, 2009 11:44 AM    Prescriptions: TRAMADOL HCL 50 MG TABS (TRAMADOL HCL) take two pills in the am and 1 pill in the pm  #96 x 11   Entered and Authorized by:   Jamie Brookes MD   Signed by:   Jamie Brookes MD on 08/13/2009   Method used:   Electronically to        [facility] (retail)       [address]       [place], [place]  [address]       Ph: [phone]       Fax: [phone]   RxID:   [id number] ALLOPURINOL 100 MG  TABS (ALLOPURINOL) 1/2 daily.  #30 x 11   Entered and Authorized by:   Jamie Brookes MD   Signed by:   Jamie Brookes MD on 08/13/2009   Method used:   Electronically to        [facility] (retail)       [address]       [place], [place]  [address]       Ph: [phone]       Fax: [phone]   RxID:   [id number]

## 2010-07-02 NOTE — Assessment & Plan Note (Signed)
Summary: stool incontinence, anxiety   Vital Signs:  Patient profile:   70 year old female Height:      62.5 inches Weight:      188.3 pounds BMI:     34.01 Temp:     98.0 degrees F oral Pulse rate:   60 / minute BP sitting:   166 / 74  (left arm) Cuff size:   large  Vitals Entered By: Gladstone Pih (August 20, 2009 4:23 PM) CC: C/O Bowel leakage,nausea Comments also C/O feet and hands itching all the time and cold all the time   Primary Care Provider:  Jamie Brookes MD  CC:  C/O Bowel leakage and nausea.  History of Present Illness: Recurrent Bowel Leakage: Pt has been having bowel incontinence for the last 10 days. She passes peanut sized peices of stool and doesn't even know she is doing it until someone askes "what's that smell?" This is making her distraught and anxious and perpetuates the problem. She has had this problem in the past and has been worked up at the GI office thoroughly. She was told that her sphincter was a little bit loose but that she had nothing wrong with her bowels. In the past this has happened when she is very stressed and she is again stressed. She was going to a therapist to work through some of these issues and was doing really well but has stopped going and is having recurrent symptoms. She is having to chagne her underwear 3-4 times a day and wear a pad in ther underwear which is making her rectom chafed and raw. She is also having some back spams with her anxiety. Pt also mentions that she wants to eat ice all the time. Wonders if this could mean she is anemic.    Habits & Providers  Alcohol-Tobacco-Diet     Tobacco Status: current     Tobacco Counseling: to quit use of tobacco products     Cigarette Packs/Day: 0.25  Current Medications (verified): 1)  Aspirin 81 Mg Chw Tab (Aspirin) .... Take One (1) Tablet By Mouth Daily 2)  Norvasc 10 Mg  Tabs (Amlodipine Besylate) .Marland Kitchen.. 1 Tab By Mouth Daily 3)  Lipitor 40 Mg  Tabs (Atorvastatin Calcium) .Marland Kitchen.. 1  Tab Daily 4)  Metoprolol Tartrate 100 Mg Tabs (Metoprolol Tartrate) .... Take 1 Tablet By Mouth Twice A Day For Blood Pressure. 5)  Sertraline Hcl 100 Mg Tabs (Sertraline Hcl) .... Take 1 Tablet By Mouth Once A Day 6)  Tramadol Hcl 50 Mg Tabs (Tramadol Hcl) .... Take Two Pills in The Am and 1 Pill in The Pm 7)  Allopurinol 100 Mg  Tabs (Allopurinol) .... 1/2 Daily. 8)  Darvocet-N 100 100-650 Mg Tabs (Propoxyphene N-Apap) .Marland Kitchen.. 1 By Mouth Q 6 Hrs As Needed Pain 9)  Kls Omeprazole 20 Mg Tbec (Omeprazole) .Marland Kitchen.. 1 Tab Po Qday 10)  Flonase 50 Mcg/act Susp (Fluticasone Propionate) .... Two Sprays Per Nostril Daily. 11)  Hydrocortisone 2.5 % Oint (Hydrocortisone) .... Apply Ointment To The Skin 2 Times A Day. 12)  Auralgan  Soln (Acetic Ac-Antipy-Benzo-Polycos) .... 2-4 Drops in Ear Up To Qid As Needed For Pain - Disp 1 Bottle 13)  Diovan 80 Mg Tabs (Valsartan) .... Take One Pill Daily 14)  Flexeril 5 Mg Tabs (Cyclobenzaprine Hcl) .... Take One Pill Prior To Going To Bed. 15)  Oxybutynin Chloride 5 Mg Tabs (Oxybutynin Chloride) .... Take One Pill Twice A Day (Morning and Evening)  Allergies (verified): 1)  !  Lyrica (Pregabalin) 2)  Sulfamethoxazole (Sulfamethoxazole) 3)  Codeine Phosphate (Codeine Phosphate) 4)  Penicillin G Potassium (Penicillin G Potassium)  Social History: Smoking Status:  current Packs/Day:  0.25  Review of Systems        vitals reviewed and pertinent negatives and positives seen in HPI   Physical Exam  General:  Well-developed,well-nourished,in no acute distress; alert,appropriate and cooperative throughout examination Psych:  anxious and slightly depressed.    Impression & Recommendations:  Problem # 1:  ENCOPRESIS (ICD-307.7) Assessment Deteriorated Pt has stool incontinence 3-4 times a day and doesn't even know she has passed stool. Has been this way in the past when she is very stressed. Has recently stopped going to therapy. Advised she restart therapy. Pt  agrees.   Orders: [facility]- Est Level  3 (59563)  Problem # 2:  ANXIETY STATE, UNSPECIFIED (ICD-300.00) Assessment: Deteriorated Pt has increased anxiety now. She is worried about her children. Advised she go back to her therapist to help her cope with the increased anxiety level. She agrees.   Her updated medication list for this problem includes:    Sertraline Hcl 100 Mg Tabs (Sertraline hcl) .Marland Kitchen... Take 1 tablet by mouth once a day  Orders: [facility]- Est Level  3 ([address]64)  Complete Medication List: 1)  Aspirin 81 Mg Chw Tab (Aspirin) .... Take one (1) tablet by mouth daily 2)  Norvasc 10 Mg Tabs (Amlodipine besylate) .Marland Kitchen.. 1 tab by mouth daily 3)  Lipitor 40 Mg Tabs (Atorvastatin calcium) .Marland Kitchen.. 1 tab daily 4)  Metoprolol Tartrate 100 Mg Tabs (Metoprolol tartrate) .... Take 1 tablet by mouth twice a day for blood pressure. 5)  Sertraline Hcl 100 Mg Tabs (Sertraline hcl) .... Take 1 tablet by mouth once a day 6)  Tramadol Hcl 50 Mg Tabs (Tramadol hcl) .... Take two pills in the am and 1 pill in the pm 7)  Allopurinol 100 Mg Tabs (Allopurinol) .... 1/2 daily. 8)  Darvocet-n 100 100-650 Mg Tabs (Propoxyphene n-apap) .Marland Kitchen.. 1 by mouth q 6 hrs as needed pain 9)  Kls Omeprazole 20 Mg Tbec (Omeprazole) .Marland Kitchen.. 1 tab po qday 10)  Flonase 50 Mcg/act Susp (Fluticasone propionate) .... Two sprays per nostril daily. 11)  Hydrocortisone 2.5 % Oint (Hydrocortisone) .... Apply ointment to the skin 2 times a day. 12)  Auralgan Soln (Acetic ac-antipy-benzo-polycos) .... 2-4 drops in ear up to qid as needed for pain - disp 1 bottle 13)  Diovan 80 Mg Tabs (Valsartan) .... Take one pill daily 14)  Flexeril 5 Mg Tabs (Cyclobenzaprine hcl) .... Take one pill prior to going to bed. 15)  Oxybutynin Chloride 5 Mg Tabs (Oxybutynin chloride) .... Take one pill twice a day (morning and evening)  Patient Instructions: 1)  Try the Oxybutinyn for overactive bladder. 2)  Call your therapist and get back in with her.  3)  Come  back in 2-4 weeks if you are still feeling cold.  Prescriptions: OXYBUTYNIN CHLORIDE 5 MG TABS (OXYBUTYNIN CHLORIDE) take one pill twice a day (morning and evening)  #64 x 3   Entered and Authorized by:   Jamie Brookes MD   Signed by:   Jamie Brookes MD on 08/20/2009   Method used:   [facility] to        [facility]       [address]       [place], [place]  [address]  Ph: [phone]       Fax: [phone]   RxID:   [id number]   Appended Document: stool incontinence, anxiety pt mentioned she is cold all the time and that she has been anemic in the past. The lab was already closed but would suggest the patient come back to get CBC, TSH done if she does not start feeling better (warmer now that it is spring).

## 2010-07-02 NOTE — Miscellaneous (Signed)
Summary: norvasc refill  Clinical Lists Changes  Medications: Rx of NORVASC 10 MG  TABS (AMLODIPINE BESYLATE) 1 tab by mouth daily;  #96 x 3;  Signed;  Entered by: Jamie Brookes MD;  Authorized by: Jamie Brookes MD;  Method used: Electronically to [facility] [address], [place], [place], [place]  [address], Ph: [phone], Fax: [phone]    Prescriptions: NORVASC 10 MG  TABS (AMLODIPINE BESYLATE) 1 tab by mouth daily  #96 x 3   Entered and Authorized by:   Jamie Brookes MD   Signed by:   Jamie Brookes MD on 01/17/2010   Method used:   Electronically to        [facility] (retail)       [address]       [place], [place]  [address]       Ph: [phone]       Fax: [phone]   RxID:   [id number]

## 2010-07-02 NOTE — Assessment & Plan Note (Signed)
Summary: Hospital admission H&P   Vital Signs:  Patient profile:   70 year old female O2 Sat:      98 % on Room air Pulse rate:   61 / minute Resp:     18 per minute BP supine:   169 / 61  Primary Provider:  Jamie Brookes MD  CC:  Hospital admission H&P.  History of Present Illness: This is a 70 yo female with PMH stage 4 CKD, HTN, IBS, HLD who presents with nausea, dizziness, lightheadedness.  Pt found to have an elevated Cr of 4.1 (baseline 2-3).  Nausea has been constant for the last 2-3 months.  Dizziness started 1 month ago.  Pt describes it as "sensation I am falling backwards."  She denies any recent falls.  Pt also c/o polyuria - urinating 4-5x/day and 5-6x/night which is abnormal for her.  Urine is dark colored and contains bubbles.  Pt's legs painful bilaterally and has had a constant headache x1 week.  Denies any use of NSAIDS.  [id number] nausea, dizziness, HA, leg pain, dry skin.  Denies fever, edema, chills, sweats.  Current Medications (verified): 1)  Aspirin 81 Mg Chw Tab (Aspirin) .... Take One (1) Tablet By Mouth Daily 2)  Norvasc 10 Mg  Tabs (Amlodipine Besylate) .Marland Kitchen.. 1 Tab By Mouth Daily 3)  Lipitor 40 Mg  Tabs (Atorvastatin Calcium) .Marland Kitchen.. 1 Tab Daily 4)  Metoprolol Tartrate 100 Mg Tabs (Metoprolol Tartrate) .... Take 1 Tablet By Mouth Twice A Day For Blood Pressure. 5)  Sertraline Hcl 100 Mg Tabs (Sertraline Hcl) .... Take 1 Tablet By Mouth Once A Day 6)  Tramadol Hcl 50 Mg Tabs (Tramadol Hcl) .... Take Two Pills in The Am and 1 Pill in The Pm 7)  Allopurinol 100 Mg  Tabs (Allopurinol) .... 1/2 Daily. 8)  Kls Omeprazole 20 Mg Tbec (Omeprazole) .Marland Kitchen.. 1 Tab Po Qday 9)  Flonase 50 Mcg/act Susp (Fluticasone Propionate) .... Two Sprays Per Nostril Daily. 10)  Hydrocortisone 2.5 % Oint (Hydrocortisone) .... Apply Ointment To The Skin 2 Times A Day. 11)  Auralgan  Soln (Acetic Ac-Antipy-Benzo-Polycos) .... 2-4 Drops in Ear Up To Qid As Needed For Pain - Disp 1  Bottle 12)  Diovan 80 Mg Tabs (Valsartan) .... Take One Pill Daily 13)  Flexeril 5 Mg Tabs (Cyclobenzaprine Hcl) .... Take One Pill Prior To Going To Bed. 14)  Oxybutynin Chloride 5 Mg Tabs (Oxybutynin Chloride) .... Take One Pill Twice A Day (Morning and Evening) 15)  Nitrostat 0.4 Mg Subl (Nitroglycerin) .... Take 1pill Every 5 Minutes X 3 For Chest Pain. If You Have To Take This Please Call My Office, If No Relief Call 911  Allergies (verified): 1)  ! 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 -  07/02/2003, MRI Rt knee - degen change - 10/29/2005, Pelvic U/S--4cm complex mass R ovary area - 08/25/2000, Punch biopsy L scalp:  seborrheic keratosis - 03/14/2004  Appendectomy ? Cholecystectomy ? Hysterectomy ?  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 in NAD Lungs:  Normal respiratory effort, chest expands symmetrically. Lungs are clear to auscultation, no crackles or wheezes. Heart:  Normal rate and regular rhythm. S1 and S2 normal without gallop, murmur, click, rub or other extra sounds. Abdomen:  soft, non-tender, normal bowel sounds, and no distention.   Pulses:  dorsalis pedis and posterior tibial pulses are full and equal bilaterally Extremities:  No clubbing, cyanosis, edema. Neurologic:  strength normal in all extremities and sensation intact to light touch, especially sensitive in R sinus where pt had surgery. Gait normal.  No focal neurologic deficits. Additional Exam:  CE: Myoglobin 348, Troponin <0.05, CKMB 2.9 CBC: 7.4>10.7/33.6<252 Istat: Na 143, K 4.3, Cl 115, BUN 45, Cr 4.1, glucose 89   Impression & Recommendations:  Problem # 1:  Nausea/Dizziness Likely secondary to acute renal failure.  Will give maintenance fluids at 100 cc NS for now.  Will admit to tele bed.  If symptoms worsen, may consider giving Phenergan as needed.  Will follow.  Problem # 2:  CHRONIC KIDNEY DISEASE STAGE IV (SEVERE) (ICD-585.4) Pt in acute on chronic renal failure.  Cr is 4.1 today.  Will hydrate as above and recheck BMet in am.   Will order renal ultrasound to r/o obstruction.  Will get UA, urine NA and Cr, and Urine culture.  Plan to consult nephrology in am.  Problem # 3:  HYPERTENSION, BENIGN SYSTEMIC (ICD-401.1) Pt's blood pressures have been trending up.  Will continue home meds of Metoprolol and Norvasc.  Diovan will be held due to nephrotoxicity.  Pt to  receive one dose of Metoprolol tonight.  If BP continues to rise, will consider giving Norvasc tonight or starting Labetolol IV as needed.  WIll continue to monitor.  Her updated medication list for this problem includes:    Norvasc 10 Mg Tabs (Amlodipine besylate) .Marland Kitchen... 1 tab by mouth daily    Metoprolol Tartrate 100 Mg Tabs (Metoprolol tartrate) .Marland Kitchen... Take 1 tablet by mouth twice a day for blood pressure.    Diovan 80 Mg Tabs (Valsartan) .Marland Kitchen... Take one pill daily  Problem # 4:  HYPERCHOLESTEROLEMIA (ICD-272.0) No further work up necessary at this time.  WIll continue home meds.  Pt to follow up with PCP outpatient.  Her updated medication list for this problem includes:    Lipitor 40 Mg Tabs (Atorvastatin calcium) .Marland Kitchen... 1 tab daily  Problem # 5:  INCONTINENCE, URGE (ICD-788.31) Pt's c/o polyuria concerning for infection.  Will get UA and urine culture.  Will continue home dose of Oxybutinin.  Problem # 6:  GOUT,  UNSPECIFIED (ICD-274.9) Seems stable.  Will continue home dose of Allopurinol.  Her updated medication list for this problem includes:    Allopurinol 100 Mg Tabs (Allopurinol) .Marland Kitchen... 1/2 daily.  Problem # 7:  FEN/GI Renal diet.  Maintenance fluids.  Problem # 8:  PPx Heparin 5000 units Subcutaneously.  Problem # 9:  Disposition Pending clinical improvement.  Problem # 10:  Code status DNR  Complete Medication List: 1)  Aspirin 81 Mg Chw Tab (Aspirin) .... Take one (1) tablet by mouth daily 2)  Norvasc 10 Mg Tabs (Amlodipine besylate) .Marland Kitchen.. 1 tab by mouth daily 3)  Lipitor 40 Mg Tabs (Atorvastatin calcium) .Marland Kitchen.. 1 tab daily 4)  Metoprolol Tartrate 100 Mg Tabs (Metoprolol tartrate) .... Take 1 tablet by mouth twice a day for blood pressure. 5)  Sertraline Hcl 100 Mg Tabs (Sertraline hcl) .... Take 1 tablet by mouth once a day 6)  Tramadol Hcl 50 Mg Tabs (Tramadol hcl) .... Take two pills in the am and 1 pill in the pm 7)  Allopurinol 100 Mg Tabs (Allopurinol) .... 1/2  daily. 8)  Kls Omeprazole 20 Mg Tbec (Omeprazole) .Marland Kitchen.. 1 tab po qday 9)  Flonase 50 Mcg/act Susp (Fluticasone propionate) .... Two sprays per nostril daily. 10)  Hydrocortisone 2.5 % Oint (Hydrocortisone) .... Apply ointment to the skin 2 times a day. 11)  Auralgan Soln (Acetic ac-antipy-benzo-polycos) .... 2-4 drops in ear up to qid as needed for pain - disp 1 bottle 12)  Diovan 80 Mg Tabs (Valsartan) .... Take one pill daily 13)  Flexeril 5 Mg Tabs (Cyclobenzaprine hcl) .... Take one pill prior to going to bed. 14)  Oxybutynin Chloride 5 Mg Tabs (Oxybutynin chloride) .... Take one pill twice a day (morning and evening) 15)  Nitrostat 0.4 Mg Subl (Nitroglycerin) .... Take 1pill every 5 minutes x 3 for chest pain. if you have to take this please call my office, if no relief call 911

## 2010-07-04 NOTE — Progress Notes (Signed)
  Phone Note Call from Patient Call back at Home Phone [phone]   Caller: Patient Summary of Call: Pt is having a gout flare and is having very bad pain.  Pt multuple times in the past have to use a prednisone burst and would like another one.  Told pt we do not usually prescribe over the phone but due to it being a holiday I will make an exception this one time.  Told pt will take it for the next five days. Gave pt red flags to look out for as well. Pt was much appreciative.  Initial call taken by: Antoine Primas DO,  May 25, 2010 9:36 AM  Follow-up for Phone Call       Follow-up by: Antoine Primas DO,  May 25, 2010 9:31 AM    New/Updated Medications: PREDNISONE 20 MG TABS (PREDNISONE) 2 tabs by mouth daily for the next 5 days Prescriptions: PREDNISONE 20 MG TABS (PREDNISONE) 2 tabs by mouth daily for the next 5 days  #10 x 0   Entered and Authorized by:   Antoine Primas DO   Signed by:   Antoine Primas DO on 05/25/2010   Method used:   Electronically to        [facility] (retail)       [address]       [place], [place]  [address]       Ph: [phone]       Fax: [phone]   RxID:   [id number]

## 2010-07-04 NOTE — Progress Notes (Signed)
Summary: doing well, going to dialysis  Phone Note Call from Patient   Caller: Daughter-[name] Summary of Call: Called to let provider know [name] made it through today and doing a little better. Taking to have her dialysis port flushed this afternoon at 1:00. Initial call taken by: Abundio Miu,  June 17, 2010 8:53 AM  Follow-up for Phone Call        Awesome. I'm so glad that she is doing better. I will see her at her scheduled appt on Jan 24th.  Follow-up by: Jamie Brookes MD,  June 17, 2010 1:50 PM

## 2010-07-04 NOTE — Consult Note (Signed)
Vital Signs:  Patient profile:   70 year old female O2 Sat:      100 % on Room air Temp:     98.5 degrees F Pulse rate:   73 / minute Resp:     16 per minute BP supine:   147 / 65  Primary Provider:  Jamie Brookes MD   History of Present Illness: Pt. states that her right hand swelled up and started hurting yesterday.  She says it feels like a gout attack.  She says she was initially fine when she went home from the hosptial 4 days ago after placement of a fistula graft in her right arm and a dialysis catheter in her right subclavian.  Pt states the pain is 10/10 and she cannot make a fist or use her right hand for anything secondary to pain and swellling.  She denies any pain, drainage, erythema from the surgery site.    ROS: Pt. denies any fevers, n/v/d, urinary symptoms, dizzyness, chest pain, or shortness of breath.   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) 3)  Hip Pain, Bilateral  (ICD-719.45) 4)  Ear Pain, Bilateral  (ICD-388.70) 5)  Diarrhea  (ICD-787.91) 6)  Back Pain, Lumbar  (ICD-724.2) 7)  Bladder Prolapse  (ICD-596.9) 8)  Fecal Incontinence  (ICD-787.6) 9)  Memory Loss  (ICD-780.93) 10)  Osteoarthritis, Generalized, Multiple Joints  (ICD-715.09) 11)  Secondary Hyperparathyroidism  (ICD-588.81) 12)  Chronic Kidney Disease Stage Iv (SEVERE)  (ICD-585.4) 13)  Anxiety State, Unspecified  (ICD-300.00) 14)  Metabolic Syndrome X  (ICD-277.7) 15)  Obesity Nos  (ICD-278.00) 16)  Anemia Nec  (ICD-285.8) 17)  Fatigue  (ICD-780.79) 18)  Dysphagia, Unspecified  (ICD-787.20) 19)  Chest Pain, Atypical  (ICD-786.59) 20)  Tobacco Abuse  (ICD-305.1) 21)  Hiatal Hernia  (ICD-553.3) 22)  Obesity, Nos  (ICD-278.00) 23)  Incontinence, Urge  (ICD-788.31) 24)  Hypertension, Benign Systemic  (ICD-401.1) 25)  Hypercholesterolemia  (ICD-272.0) 26)  Gout, Unspecified  (ICD-274.9) 27)  Gastroesophageal Reflux, No Esophagitis   (ICD-530.81) 28)  Depressive Disorder, Nos  (ICD-311)  Medications Prior to Update: 1)  Aspirin 81 Mg Chw Tab (Aspirin) .... Take One (1) Tablet By Mouth Daily 2)  Norvasc 10 Mg  Tabs (Amlodipine Besylate) .Marland Kitchen.. 1 Tab By Mouth Daily 3)  Lipitor 40 Mg  Tabs (Atorvastatin Calcium) .Marland Kitchen.. 1 Tab Daily 4)  Metoprolol Tartrate 100 Mg Tabs (Metoprolol Tartrate) .... Take 1 Tablet By Mouth Twice A Day For Blood Pressure. 5)  Sertraline Hcl 100 Mg Tabs (Sertraline Hcl) .... Take 1 Tablet By Mouth Once A Day 6)  Tramadol Hcl 50 Mg Tabs (Tramadol Hcl) .... Take Two Pills in The Am and 2 Pill in The Pm 7)  Allopurinol 100 Mg  Tabs (Allopurinol) .Marland Kitchen.. 1 Tablet Daily 8)  Kls Omeprazole 20 Mg Tbec (Omeprazole) .Marland Kitchen.. 1 Tab Po Qday 9)  Hydrocortisone 2.5 % Oint (Hydrocortisone) .... Apply Ointment To The Skin 2 Times A Day. 10)  Oxybutynin Chloride 5 Mg Tabs (Oxybutynin Chloride) .... Take One Pill Twice A Day (Morning and Evening) 11)  Nitrostat 0.4 Mg Subl (Nitroglycerin) .... Take 1pill Every 5 Minutes X 3 For Chest Pain. If You Have To Take This Please Call My Office, If No Relief Call 911 12)  Promethazine Hcl 12.5 Mg Tabs (Promethazine Hcl) .... Take 1-2 Tablets To Treat Nausea Every 6 Hours If Needed.  Allergies: 1)  ! 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 - 07/02/2003, MRI Rt knee - degen change - 10/29/2005, Pelvic U/S--4cm complex mass R ovary area - 08/25/2000, Punch biopsy L scalp:  seborrheic keratosis - 03/14/2004  Appendectomy ? Cholecystectomy ? Hysterectomy ?  Family History: Last updated: April 17, 2009 Sister died of breast cancer age 50, Another Sister w/ recurrent thyroid CA Another sister has polio, alive, 67 Dad died at 50 of CHF and stroke Mom died of thyroid CA takes care of neice with mental retardation (IQ 16)  Social History: Last updated: Apr 17, 2009 Separated from [employer] x 20 years; Guardian for mentally retarded niece, Gretel Acre; No tob, EtoH, drugs.  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  Teeth in good repair. Neck:  No deformities, masses, or tenderness noted. Lungs:  Normal respiratory effort, chest expands symmetrically. Lungs are clear to auscultation, no crackles or wheezes. Heart:  Normal rate and regular rhythm. S1 and S2 normal without gallop, murmur, click, rub or other extra sounds. Abdomen:  Bowel sounds positive,abdomen soft and non-tender without masses, organomegaly or hernias noted. Msk:  Range of motion or right hand limited secondary to pain and swelling.  Pulses:  2+ pulses in all four extremities.  Extremities:  Right hand with non-pitting edema, warm to touch, very painful to touch.  No  erythema, warmth, pain, or drainage in right antecubital fossa, where fistula incision is healing well.  Palpable thrill, audible bruit.  Neurologic:  alert & oriented X3.   Labs: CBC with Diff:   WBC                                      10.4              4.0-10.5         K/uL  RBC                                      3.68       l      3.87-5.11        MIL/uL  Hemoglobin (HGB)                         9.9        l      12.0-15.0  g/dL  Hematocrit (HCT)                         31.7       l      36.0-46.0        %  MCV                                      86.1              78.0-100.0       fL  MCH -                                    26.9              26.0-34.0        pg  MCHC                                     31.2              30.0-36.0        g/dL  RDW                                      17.9       h      11.5-15.5        %  Platelet Count (PLT)                     269               150-400          K/uL  Neutrophils, %                           65                43-77            %  Lymphocytes, %                           20                12-46            %  Monocytes, %                             10                3-12             %  Eosinophils, %                           5                 0-5              %  Basophils, %  0                 0-1              %  Neutrophils, Absolute                    6.8               1.7-7.7          K/uL  Lymphocytes, Absolute                    2.1               0.7-4.0          K/uL  Monocytes, Absolute                      1.0               0.1-1.0          K/uL  Eosinophils, Absolute                    0.6               0.0-0.7          K/uL  Basophils, Absolute                      0.0               0.0-0.1          K/uL  BMET:  Sodium (NA)                              138               135-145          mEq/L  Potassium (K)                            4.7               3.5-5.1          mEq/L  Chloride                                  109               96-112           mEq/L  CO2                                      21                19-32            mEq/L  Glucose                                  95                70-99            mg/dL  BUN  41         h      6-23             mg/dL  Creatinine                               4.89       h      0.4-1.2          mg/dL  GFR, Est Non African American            9          l      >60              mL/min  GFR, Est African American                11         l      >60              mL/min    Oversized comment, see footnote  1  Calcium                                  9.1               8.4-10.5         mg/dL   Complete Medication List: 1)  Aspirin 81 Mg Chw Tab (Aspirin) .... Take one (1) tablet by mouth daily 2)  Norvasc 10 Mg Tabs (Amlodipine besylate) .Marland Kitchen.. 1 tab by mouth daily 3)  Lipitor 40 Mg Tabs (Atorvastatin calcium) .Marland Kitchen.. 1 tab daily 4)  Metoprolol Tartrate 100 Mg Tabs (Metoprolol tartrate) .... Take 1 tablet by mouth twice a day for blood pressure. 5)  Sertraline Hcl 100 Mg Tabs (Sertraline hcl) .... Take 1 tablet by mouth once a day 6)  Tramadol Hcl 50 Mg Tabs (Tramadol hcl) .... Take two pills in the am and 2 pill in the pm 7)  Allopurinol 100 Mg Tabs (Allopurinol) .Marland Kitchen.. 1 tablet daily 8)  Kls Omeprazole 20 Mg Tbec (Omeprazole) .Marland Kitchen.. 1 tab po qday 9)  Hydrocortisone 2.5 % Oint (Hydrocortisone) .... Apply ointment to the skin 2 times a day. 10)  Oxybutynin Chloride 5 Mg Tabs (Oxybutynin chloride) .... Take one pill twice a day (morning and evening) 11)  Nitrostat 0.4 Mg Subl (Nitroglycerin) .... Take 1pill every 5 minutes x 3 for chest pain. if you have to take this please call my office, if no relief call 911 12)  Promethazine Hcl 12.5 Mg Tabs (Promethazine hcl) .... Take 1-2 tablets to treat nausea every 6 hours if needed. 13)  Prednisone 20 Mg Tabs (Prednisone) .... Take two tabs by mouth for 3 days then take one  tab by mouth for 3 days then take 1/2 tab by mouth for 4 days then stop Prescriptions: PREDNISONE 20 MG TABS (PREDNISONE) Take two tabs by mouth for 3 days Then take one tab by mouth for 3 days then take 1/2 tab by mouth for 4 days then stop  #8 x 0   Entered and Authorized by:   [name] MD   Signed by:   [name] MD on 06/17/2010   Method used:   Electronically to        [facility] (retail)       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  There is a palpable thrill and audible bruit on auscultation, no sights of fistula stenosis or thrombosis.  Will treat pt for gout flair, with Prednisone burst and pain control.  Will ask pt. to follow up in [facility] in the morning if she has not improved. 2) CKD IV- pt is preparing for HD and has a dialysis catheter placed and a new right AV fistula.  Will advise she keeps her scheduled follow up appointments with Vascular surgery and Nephrology. Continue new medications started by Nephrology during recent hospitalization. 3)HTN- continue current home regimen 4) Gastroparesis- continue renally dosed reglan 5FEN/GI- continue Renal diet, pt given diet instructions during recent hospitalization.  Continue PPI.  6) Dispo- pt will be discharged home with Prednisone taper, follow up with Dr. Clotilde Dieter at [facility] on June 25, 2010 at 1:45 PM.  She may call if she does not improve with prednisone and needs to be seen sooner. She is also to follow up with Dr. Caryn Section of Nephrology on Janyary 24, 2012 at 9 am.

## 2010-07-04 NOTE — Assessment & Plan Note (Signed)
Summary: hfu, gout   Vital Signs:  Patient profile:   70 year old female Height:      62.5 inches Weight:      177.8 pounds BMI:     32.12 Temp:     98.3 degrees F oral Pulse rate:   69 / minute BP sitting:   155 / 74  (left arm) Cuff size:   large  Vitals Entered By: Jimmy Footman, CMA (June 25, 2010 1:47 PM) CC: hospital follow up, gout Is Patient Diabetic? No Pain Assessment Patient in pain? no        Primary Care Provider:  Jamie Brookes MD  CC:  hospital follow up and gout.  History of Present Illness: Pt was recently hospitalized for kidney failure. She was hydrated and there was concern that she was going to have to go on dialysis. She had a port-a-cath and a fistula placed while in the hospital.  She saw Dr. Caryn Section with [facility] this morning and will see him again in 1 month. She had labs done at [facility] today. They are planning to get her port-a-cath out because her kidneys are doing a little better and they think that her fistula will be ready to use if/when she needs it. Mentally she is doing well with all of this. Her family is being very supportive.   Gout: Pt had a recent gout flair which landed her back in the ED just after she was discharged. She is taking prednisone and on her regular Allopurinol (which had been stopped during her hospitalization and could be the reason that she flared up).   Habits & Providers  Alcohol-Tobacco-Diet     Tobacco Status: never  Current Medications (verified): 1)  Aspirin 81 Mg Chw Tab (Aspirin) .... Take One (1) Tablet By Mouth Daily 2)  Norvasc 10 Mg  Tabs (Amlodipine Besylate) .Marland Kitchen.. 1 Tab By Mouth Daily 3)  Lipitor 40 Mg  Tabs (Atorvastatin Calcium) .Marland Kitchen.. 1 Tab Daily 4)  Metoprolol Tartrate 100 Mg Tabs (Metoprolol Tartrate) .... Take 1 Tablet By Mouth Twice A Day For Blood Pressure. 5)  Sertraline Hcl 100 Mg Tabs (Sertraline Hcl) .... Take 1 Tablet By Mouth Once A Day 6)  Tramadol Hcl 50 Mg Tabs (Tramadol Hcl) .... Take Two Pills in  The Am and 2 Pill in The Pm 7)  Allopurinol 100 Mg  Tabs (Allopurinol) .Marland Kitchen.. 1 Tablet Daily 8)  Kls Omeprazole 20 Mg Tbec (Omeprazole) .Marland Kitchen.. 1 Tab Po Qday 9)  Hydrocortisone 2.5 % Oint (Hydrocortisone) .... Apply Ointment To The Skin 2 Times A Day. 10)  Oxybutynin Chloride 5 Mg Tabs (Oxybutynin Chloride) .... Take One Pill Twice A Day (Morning and Evening) 11)  Nitrostat 0.4 Mg Subl (Nitroglycerin) .... Take 1pill Every 5 Minutes X 3 For Chest Pain. If You Have To Take This Please Call My Office, If No Relief Call 911 12)  Promethazine Hcl 12.5 Mg Tabs (Promethazine Hcl) .... Take 1-2 Tablets To Treat Nausea Every 6 Hours If Needed. 13)  Prednisone 20 Mg Tabs (Prednisone) .... Take Two Tabs By Mouth For 3 Days Then Take One Tab By Mouth For 3 Days Then Take 1/2 Tab By Mouth For 4 Days Then Stop  Allergies (verified): 1)  ! Lyrica (Pregabalin) 2)  Sulfamethoxazole (Sulfamethoxazole) 3)  Codeine Phosphate (Codeine Phosphate) 4)  Penicillin G Potassium (Penicillin G Potassium)  Social History: Smoking Status:  never  Review of Systems        vitals reviewed and pertinent negatives and  positives seen in HPI   Physical Exam  General:  Well-developed,well-nourished,in no acute distress; alert,appropriate and cooperative throughout examination Msk:  no swellingin her joints noted Pulses:  Rt arm fistula has palpable thrill/bruit Psych:  more interactive and happy appearing than normal.   Impression & Recommendations:  Problem # 1:  CHRONIC KIDNEY DISEASE STAGE IV (SEVERE) (ICD-585.4) Assessment Deteriorated Pt is being followed by Renal. She is nearing the need for dialysis and a fistula was created during her recent hospital stay. She is doing ok for right now but is being closely followed by renal. She has read through some of the material about which foods to not eat. She is trying to make dietary changes.   Orders: [facility]- Est Level  3 (54098)  Problem # 2:  GOUT, UNSPECIFIED  (ICD-274.9) Pt was given a precription to fill for prednisone that she will use at the onset of her next flair. the reason thos one was so bad is that she was not treated right away. She is taking her Allopurinol again and doing well on it.   Her updated medication list for this problem includes:    Allopurinol 100 Mg Tabs (Allopurinol) .Marland Kitchen... 1 tablet daily  Orders: [facility]- Est Level  3 (11914)  Complete Medication List: 1)  Aspirin 81 Mg Chw Tab (Aspirin) .... Take one (1) tablet by mouth daily 2)  Norvasc 10 Mg Tabs (Amlodipine besylate) .Marland Kitchen.. 1 tab by mouth daily 3)  Lipitor 40 Mg Tabs (Atorvastatin calcium) .Marland Kitchen.. 1 tab daily 4)  Metoprolol Tartrate 100 Mg Tabs (Metoprolol tartrate) .... Take 1 tablet by mouth twice a day for blood pressure. 5)  Sertraline Hcl 100 Mg Tabs (Sertraline hcl) .... Take 1 tablet by mouth once a day 6)  Tramadol Hcl 50 Mg Tabs (Tramadol hcl) .... Take two pills in the am and 2 pill in the pm 7)  Allopurinol 100 Mg Tabs (Allopurinol) .Marland Kitchen.. 1 tablet daily 8)  Kls Omeprazole 20 Mg Tbec (Omeprazole) .Marland Kitchen.. 1 tab po qday 9)  Hydrocortisone 2.5 % Oint (Hydrocortisone) .... Apply ointment to the skin 2 times a day. 10)  Oxybutynin Chloride 5 Mg Tabs (Oxybutynin chloride) .... Take one pill twice a day (morning and evening) 11)  Nitrostat 0.4 Mg Subl (Nitroglycerin) .... Take 1pill every 5 minutes x 3 for chest pain. if you have to take this please call my office, if no relief call 911 12)  Promethazine Hcl 12.5 Mg Tabs (Promethazine hcl) .... Take 1-2 tablets to treat nausea every 6 hours if needed. 13)  Prednisone 20 Mg Tabs (Prednisone) .... Take two tabs by mouth for 3 days then take one tab by mouth for 3 days then take 1/2 tab by mouth for 4 days then stop  Patient Instructions: 1)  It was good to see you today. 2)  I think your diet regimine is good.  3)  Try not to drink things with caffine in them, they will dehydrate you even further.  4)  I have your updated list  of meds.  5)  Get your prednisone filled so you have it for your next gout flare.    Orders Added: 1)  [facility]- Est Level  3 [78295]

## 2010-07-04 NOTE — Progress Notes (Signed)
  Phone Note Other Incoming   Summary of Call: pt called emergency line pager and states that she is having pain in her arm where she recently (this past week) had a hemodialysis access placed.  She states she feels that it is a gout flare causing pain and swelling in her arm.  She asks if I could call in a prednisone rx for gout treatment.  I informed pt that is isn't clear what the cause of the pain and swelling could be- it could be related to her recent surgery, or gout, etc. I also let her know that our policy is that we can not give refills or prescribe medication over the phone.  Told pt that if the pain is minimal that she can call in the am for a possible work in appt.  If she feels her pain is more severe and needs treatment now will need to go to ER or urgent care for eval.  Pt states understanding.  Ellin Mayhew MD  June 16, 2010 11:15 AM

## 2010-07-04 NOTE — Assessment & Plan Note (Signed)
Summary: feeling bad/has kidney failure/eo   Vital Signs:  Patient profile:   70 year old female Height:      62.5 inches Weight:      177 pounds BMI:     31.97 Temp:     98.3 degrees F oral Pulse rate:   67 / minute BP sitting:   177 / 85  (right arm) Cuff size:   large  Vitals Entered By: Jimmy Footman, CMA (June 07, 2010 3:42 PM)  Serial Vital Signs/Assessments:  Time      Position  BP       Pulse  Resp  Temp     By 4:26 PM             160/80   68                    Lynnea Vandervoort MD  CC: has kidney failure, not feeling well, dizziness, HA Is Patient Diabetic? Yes Did you bring your meter with you today? No Pain Assessment Patient in pain? yes        Primary Care Provider:  Jamie Brookes MD  CC:  has kidney failure, not feeling well, dizziness, and HA.  History of Present Illness: 70 y/o F with Stage IV CKD who presents for feeling worse x 3 days.  She complains of headache, feeling lightheaded, feels weak.  Her headache is "all over".  She does endorse dyspnea on exertion.  No fever, chills.  No vomiting.  She has had decreased appetite.  She feels so weak that she does not want to eat.  She feels too weak to perform normal housework. She believes that her face is more swollen than normal.  She states taht she cannot go another day or night feeling like this.  She states "I'm getting weaker and weaker."   She also endorses decreased urination.   She has an appt with Dr Caryn Section 06/25/10 but has been trying to get an earlier appt.    Habits & Providers  Alcohol-Tobacco-Diet     Tobacco Status: current  Current Medications (verified): 1)  Aspirin 81 Mg Chw Tab (Aspirin) .... Take One (1) Tablet By Mouth Daily 2)  Norvasc 10 Mg  Tabs (Amlodipine Besylate) .Marland Kitchen.. 1 Tab By Mouth Daily 3)  Lipitor 40 Mg  Tabs (Atorvastatin Calcium) .Marland Kitchen.. 1 Tab Daily 4)  Metoprolol Tartrate 100 Mg Tabs (Metoprolol Tartrate) .... Take 1 Tablet By Mouth Twice A Day For Blood Pressure. 5)  Sertraline Hcl  100 Mg Tabs (Sertraline Hcl) .... Take 1 Tablet By Mouth Once A Day 6)  Tramadol Hcl 50 Mg Tabs (Tramadol Hcl) .... Take Two Pills in The Am and 2 Pill in The Pm 7)  Allopurinol 100 Mg  Tabs (Allopurinol) .Marland Kitchen.. 1 Tablet Daily 8)  Kls Omeprazole 20 Mg Tbec (Omeprazole) .Marland Kitchen.. 1 Tab Po Qday 9)  Hydrocortisone 2.5 % Oint (Hydrocortisone) .... Apply Ointment To The Skin 2 Times A Day. 10)  Oxybutynin Chloride 5 Mg Tabs (Oxybutynin Chloride) .... Take One Pill Twice A Day (Morning and Evening) 11)  Nitrostat 0.4 Mg Subl (Nitroglycerin) .... Take 1pill Every 5 Minutes X 3 For Chest Pain. If You Have To Take This Please Call My Office, If No Relief Call 911 12)  Promethazine Hcl 12.5 Mg Tabs (Promethazine Hcl) .... Take 1-2 Tablets To Treat Nausea Every 6 Hours If Needed.  Allergies (verified): 1)  ! 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 - 07/02/2003, MRI Rt knee - degen change - 10/29/2005, Pelvic U/S--4cm complex mass R ovary area - 08/25/2000, Punch biopsy L scalp:  seborrheic keratosis - 03/14/2004  Appendectomy ? Cholecystectomy  ? Hysterectomy ?  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 CV:  Complains of fatigue, lightheadness, and near fainting; denies chest pain or discomfort, fainting, and swelling of feet. Resp:  Denies chest discomfort, cough, and coughing up blood. GI:  Complains of nausea; denies abdominal pain, bloody stools, change in bowel habits, diarrhea, and vomiting. GU:  Denies discharge, dysuria, incontinence, and urinary frequency.  Physical Exam  General:  Well-developed,well-nourished,in no acute distress; alert,appropriate and cooperative throughout examination. Vitals reviewed.  Head:  normocephalic and no abnormalities observed.   + facial edema  Mouth:  Oral mucosa and oropharynx without lesions or exudates.  MMM Lungs:  Normal respiratory effort, chest expands symmetrically. Lungs are clear to auscultation, no crackles or wheezes. Heart:  Normal rate and regular rhythm. S1 and S2 normal without gallop, murmur, click, rub or other extra sounds. Abdomen:  Bowel sounds positive,abdomen soft and non-tender without masses, organomegaly or hernias noted. Pulses:  +2 bilaterally  Extremities:  No edema Neurologic:  alert &  oriented X3.  Nonfocal  Skin:  dry skin  Cervical Nodes:  No lymphadenopathy noted   Impression & Recommendations:  Problem # 1:  Headache, Lightheadedness, Decreased PO Intake 70 y/o F with CKD stage 4 who has had worsening fatigue and lightheadedness x 3 days.  She was admitted Dec 3-5 with similar symptoms of nausea and weakness.  At that time she was uremic with Cr 4.6 and with hydration Cr was 3.91 on discharge.  Prior to that episode her Cr was 2.5-2.6 at baseline.  She has had decreased by mouth intake in the past 3 days, so we do expect that her renal function will be worsened today.  She may also have anemia secondary to chronic kidney disease.  This may also explain her complaint of weakness.  We will [facility] her to [facility] and get  basic labs; renal panel and CBC.  We will also consult Nephrology to see pt while hospitalized.  Pt does have an appt with Dr Caryn Section on 06/25/10.    Problem # 2:  CHRONIC KIDNEY DISEASE STAGE IV (SEVERE) (ICD-585.4) See above  Problem # 3:  HYPERTENSION, BENIGN SYSTEMIC (ICD-401.1) BP elevated today.  Will resume home meds.   Her updated medication list for this problem includes:    Norvasc 10 Mg Tabs (Amlodipine besylate) .Marland Kitchen... 1 tab by mouth daily    Metoprolol Tartrate 100 Mg Tabs (Metoprolol tartrate) .Marland Kitchen... Take 1 tablet by mouth twice a day for blood pressure.  Problem # 4:  ANXIETY STATE, UNSPECIFIED (ICD-300.00) Will continue home med Her updated medication list for this problem includes:    Sertraline Hcl 100 Mg Tabs (Sertraline hcl) .Marland Kitchen... Take 1 tablet by mouth once a day  Problem # 5:  INCONTINENCE, URGE (ICD-788.31) Continue oxybutynin  Problem # 6:  GOUT, UNSPECIFIED (ICD-274.9) Given that pt has CKD 4 will discontinue this medication for now.   Her updated medication list for this problem includes:    Allopurinol 100 Mg Tabs (Allopurinol) .Marland Kitchen... 1 tablet daily  Problem # 7:  FEN/GI Renal Diet 1 L NS bolus, then 1/2 NS @  125cc/hr  Problem # 8:  Prophylaxis Heparin 5000 units Subcutaneously q8hr  Problem # 9:  Code Status DNR  Complete Medication List: 1)  Aspirin 81 Mg Chw Tab (Aspirin) .... Take one (1) tablet by mouth daily 2)  Norvasc 10 Mg Tabs (Amlodipine besylate) .Marland Kitchen.. 1 tab by mouth daily 3)  Lipitor 40 Mg Tabs (Atorvastatin calcium) .Marland Kitchen.. 1 tab daily 4)  Metoprolol Tartrate 100 Mg Tabs (Metoprolol tartrate) .... Take 1 tablet by mouth twice a day for blood pressure. 5)  Sertraline Hcl 100 Mg Tabs (Sertraline hcl) .... Take 1 tablet by mouth once a day 6)  Tramadol Hcl 50 Mg Tabs (Tramadol hcl) .... Take two pills in the am and 2 pill in the pm 7)  Allopurinol 100 Mg Tabs (Allopurinol) .Marland Kitchen.. 1 tablet daily 8)  Kls Omeprazole 20 Mg Tbec (Omeprazole) .Marland Kitchen.. 1 tab po qday 9)  Hydrocortisone 2.5 % Oint (Hydrocortisone) .... Apply ointment to the skin 2 times a day. 10)  Oxybutynin Chloride 5 Mg Tabs (Oxybutynin chloride) .... Take one pill twice a day (morning and evening) 11)  Nitrostat 0.4 Mg Subl (Nitroglycerin) .... Take 1pill every 5 minutes x 3 for chest pain. if you have to take this please call my office, if no relief call 911 12)  Promethazine Hcl 12.5 Mg Tabs (Promethazine hcl) .... Take 1-2 tablets to treat nausea every 6 hours if needed.  Other Orders: Hospital Admit-[facility] (00000)   Orders Added: 1)  Hospital Admit-[facility] [00000]

## 2010-07-04 NOTE — Progress Notes (Signed)
  Phone Note Call from Patient   Caller: Patient Call For: [phone] Summary of Call: Ms. Kope was made an appt with Dr. Caryn Section in January in error.  Need to have our office contact them to set up an earlier appt than one for 1/24 Pt should have been seen originally in December.  Pt's kidney function is getting worse.  Pllease contact pt as soon as an earlier appt has been set with Dr. Caryn Section. Initial call taken by: Abundio Miu,  June 04, 2010 9:43 AM  Follow-up for Phone Call        Jan 24th at 9:30 am is the time that it had on the discharge paperwork from the hospital. This appears to be correct and was likely the first available. She is welcome to call to see if she can get in any sooner. She might be able to get on a cancelation list or something like that. Once she is established and has an appointment with them she does not need [place] to change appointments, she can call them herself. Please let her know. Thanks.  Follow-up by: Jamie Brookes MD,  June 04, 2010 1:43 PM  Additional Follow-up for Phone Call Additional follow up Details #1::        Spoke with daughter, Agustin Cree about nephrologist appt.  Since Ms. Carapia is back in hospital she said that would be handled from there. Additional Follow-up by: Abundio Miu,  June 12, 2010 4:59 PM    Additional Follow-up for Phone Call Additional follow up Details #2::    thanks.  Follow-up by: Jamie Brookes MD,  June 13, 2010 3:56 PM

## 2010-07-04 NOTE — Assessment & Plan Note (Signed)
Summary: hospital f/u CKD stage 4   Vital Signs:  Patient profile:   70 year old female Height:      62.5 inches Weight:      178 pounds BMI:     32.15 Temp:     98.3 degrees F oral Pulse rate:   64 / minute BP sitting:   195 / 70  (left arm) Cuff size:   large  Vitals Entered By: Jimmy Footman, CMA (May 10, 2010 9:44 AM) CC: hospital follow up Is Patient Diabetic? No   Primary Care Provider:  Jamie Brookes MD  CC:  hospital follow up.  History of Present Illness: 70 y/o AAF who says she has been having nausea and dizziness most days with decreased appetite and her usual diarrhea episodes for months. She has not come in for these symptoms for the last 2 months. She says she never actually vomits but just feels nauseated all the time. She has lost 5.6 lbs the since her last visit. She says even after she was released from the hospital with instruction to drink plenty of water she is still not drinking very much. Pt says that she has continued to be nauseated and dizzy since she got home from the hospital on 12/5/1. Her Cr on the day of d/c was 3.19, she has an appointment with Dr. Caryn Section on Jan 24. We plan to retest her Cr today.   Habits & Providers  Alcohol-Tobacco-Diet     Tobacco Status: current  Current Medications (verified): 1)  Aspirin 81 Mg Chw Tab (Aspirin) .... Take One (1) Tablet By Mouth Daily 2)  Norvasc 10 Mg  Tabs (Amlodipine Besylate) .Marland Kitchen.. 1 Tab By Mouth Daily 3)  Lipitor 40 Mg  Tabs (Atorvastatin Calcium) .Marland Kitchen.. 1 Tab Daily 4)  Metoprolol Tartrate 100 Mg Tabs (Metoprolol Tartrate) .... Take 1 Tablet By Mouth Twice A Day For Blood Pressure. 5)  Sertraline Hcl 100 Mg Tabs (Sertraline Hcl) .... Take 1 Tablet By Mouth Once A Day 6)  Tramadol Hcl 50 Mg Tabs (Tramadol Hcl) .... Take Two Pills in The Am and 2 Pill in The Pm 7)  Allopurinol 100 Mg  Tabs (Allopurinol) .Marland Kitchen.. 1 Tablet Daily 8)  Kls Omeprazole 20 Mg Tbec (Omeprazole) .Marland Kitchen.. 1 Tab Po Qday 9)  Hydrocortisone 2.5  % Oint (Hydrocortisone) .... Apply Ointment To The Skin 2 Times A Day. 10)  Oxybutynin Chloride 5 Mg Tabs (Oxybutynin Chloride) .... Take One Pill Twice A Day (Morning and Evening) 11)  Nitrostat 0.4 Mg Subl (Nitroglycerin) .... Take 1pill Every 5 Minutes X 3 For Chest Pain. If You Have To Take This Please Call My Office, If No Relief Call 911 12)  Promethazine Hcl 12.5 Mg Tabs (Promethazine Hcl) .... Take 1-2 Tablets To Treat Nausea Every 6 Hours If Needed.  Allergies (verified): 1)  ! Lyrica (Pregabalin) 2)  Sulfamethoxazole (Sulfamethoxazole) 3)  Codeine Phosphate (Codeine Phosphate) 4)  Penicillin G Potassium (Penicillin G Potassium)  Review of Systems        vitals reviewed and pertinent negatives and positives seen in HPI   Physical Exam  General:  Well-developed,well-nourished,in no acute distress; alert,appropriate and cooperative throughout examination Psych:  Cognition and judgment appear intact. Alert and cooperative with normal attention span and concentration. No apparent delusions, illusions, hallucinations   Impression & Recommendations:  Problem # 1:  CHRONIC KIDNEY DISEASE STAGE IV (SEVERE) (ICD-585.4) Assessment Deteriorated Pt was recently hospitalized for CKD with a Cr of 4.3 and was discharged with  a Cr of 3.19 on 05-06-10, she was seen by [facility] and has an appointment on Jan 24th with Dr. Caryn Section. We will recheck her Cr today with a BMET. If it is elevated again plan to try to get her in to be seen with [facility] sooner. I suspect it may be higher with her reporting that she is not drinking much water/fluids. Pt had been having some nausea when she was in the hospital and says she is still having it and has low appetite because of this. Plan to treat with Phernergan so she will at least feel like drinking water.   Orders: [facility]- Est Level  3 (16109)  Complete Medication List: 1)  Aspirin 81 Mg Chw Tab (Aspirin) .... Take one (1) tablet by mouth daily 2)   Norvasc 10 Mg Tabs (Amlodipine besylate) .Marland Kitchen.. 1 tab by mouth daily 3)  Lipitor 40 Mg Tabs (Atorvastatin calcium) .Marland Kitchen.. 1 tab daily 4)  Metoprolol Tartrate 100 Mg Tabs (Metoprolol tartrate) .... Take 1 tablet by mouth twice a day for blood pressure. 5)  Sertraline Hcl 100 Mg Tabs (Sertraline hcl) .... Take 1 tablet by mouth once a day 6)  Tramadol Hcl 50 Mg Tabs (Tramadol hcl) .... Take two pills in the am and 2 pill in the pm 7)  Allopurinol 100 Mg Tabs (Allopurinol) .Marland Kitchen.. 1 tablet daily 8)  Kls Omeprazole 20 Mg Tbec (Omeprazole) .Marland Kitchen.. 1 tab po qday 9)  Hydrocortisone 2.5 % Oint (Hydrocortisone) .... Apply ointment to the skin 2 times a day. 10)  Oxybutynin Chloride 5 Mg Tabs (Oxybutynin chloride) .... Take one pill twice a day (morning and evening) 11)  Nitrostat 0.4 Mg Subl (Nitroglycerin) .... Take 1pill every 5 minutes x 3 for chest pain. if you have to take this please call my office, if no relief call 911 12)  Promethazine Hcl 12.5 Mg Tabs (Promethazine hcl) .... Take 1-2 tablets to treat nausea every 6 hours if needed.  Patient Instructions: 1)  We are going to check you kidney function again today.  2)  We refilled the Tramadol, Hydrocortisone, and a new Rx for Phernergan.    Orders Added: 1)  [facility]- Est Level  3 [[id number]]

## 2010-07-08 NOTE — H&P (Signed)
[name], [name] NO.:  [id number]  MEDICAL RECORD NO.:  [id number]          PATIENT TYPE:  INP  LOCATION:  NA                           FACILITY:  [facility]  PHYSICIAN:  Paula Compton, MD        DATE OF BIRTH:  July 13, 1940  DATE OF ADMISSION:n  January , 2011 DATE OF DISCHARGE:                             HISTORY & PHYSICAL   PRIMARY CARE PHYSICIAN:  Dr. Jamie Brookes at the [facility]  CHIEF COMPLAINT:  Not feeling well, dizziness, headache.  HISTORY OF PRESENT ILLNESS:  This is a 70 year old female with stage IV chronic kidney disease who presents for feeling worse x3 days.  The patient complains of headache, feeling lightheaded and feeling weak. The patient states that her headache is located "all over."  She does endorse dyspnea on exertion at times.  This has been going on for a while. She denies any fevers or chills, vomiting.  She has had decreased appetite over the past few days.  She feels so weak that she does not want to eat.  She feels too weak to perform normal housework, she says. She believes that her face is more swollen than normal.  She states that she cannot go another day the way she is feeling right now.  She states, "I am weaker and weaker every day."  She also endorses decreased urination, although she is still able to urinate.  The patient was recently admitted to [facility] in early December for similar symptoms of nausea and weakness and was found to be uremic with a creatinine of 4.6.  At that time, Renal saw the patient, and she has a follow-up appointment with Dr. Caryn Section on November 24, 2010.  PAST MEDICAL HISTORY: 1. Chronic kidney disease, stage IV 2. Hypertension. 3. Iron deficiency. 4. Restless leg syndrome. 5. Gout. 6. Urge incontinence. 7. Irritable bowel syndrome. 8. 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  PAST SURGICAL HISTORY: 1. Craniotomy in July 2002. 2. Ethmoidectomy secondary  to infection in May 2002. 3. Patch biopsy of left scalp in October 2005. 4. Questionable appendectomy. 5. Questionable cholecystectomy. 6. Questionable hysterectomy.  CURRENT MEDICATIONS: 1. Aspirin 81 mg p.o. daily. 2. Norvasc 10 mg p.o. daily. 3. Lipitor 40 mg p.o. q.h.s. 4. Metoprolol 100 mg p.o. b.i.d. 5. Sertraline 100 mg p.o. daily. 6. Tramadol 50 mg 2 pills in the morning and 2 pills in the p.m. 7. Allopurinol 100 mg p.o. daily. 8. Omeprazole 20 mg p.o. daily. 9. Hydrocortisone 2.5% ointment applied to skin b.i.d. 10.Oxybutynin 5 mg 1 tablet p.o. b.i.d. 11.Nitrostat 0.4 mg as needed. 12.Phenergan 12.5 mg 1 to 2 tablets as needed for nausea q.6h.  ALLERGIES: 1. LYRICA. 2. SULFA 3. CODEINE 4. PENICILLIN.  FAMILY HISTORY: 1. Sister died of breast cancer at age 68. 2. Another sister with recurrent thyroid cancer. 3. Another sister had polio who is still currently living at age 60. 87. Father died age at 78 of CHF and stroke. 5. Mother died of thyroid cancer. 6. Has a niece who has mental retardation.  SOCIAL HISTORY:  1. The patient is separated from her husband for 20 years now. She is     a guardian to a mentally retarded niece. 2. No tobacco, no alcohol, no drugs. 3. She has two daughters who live in the area. 4. 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  Patient denies chills and fever.  CARDIOVASCULAR:  Complains of fatigue, lightheadedness and near fainting denies chest pain or discomfort, fainting or swelling of feet.  RESPIRATORY: Denies chest discomfort, cough or coughing up blood.  GI.  Complains of nausea.  Denies abdominal pain, bloody stools, change in bowel habit diarrhea or vomiting. GU: Denies discharge, dysuria, incontinence.  PHYSICAL EXAMINATION:  Height is 62.5 inches. Weight 177 pounds, temperature 98.3, heart rate 67, blood pressure 177/85. Blood  pressure 160/80. GENERAL:  Well-developed, well-nourished in no acute distress, alert and appropriate throughout exam. HEAD:  Normocephalic, atraumatic.  Positive facial edema. MOUTH:  Moist mucous membranes. LUNGS:  Clear to auscultation bilaterally.  No wheezing or crackles. HEART: Regular rate and rhythm.  No murmurs or gallops. ABDOMEN: Positive bowel sounds, nontender, nondistended.  No organomegaly.  Pulses are +2 bilaterally. EXTREMITIES: Showed no edema, cyanosis or clubbing. NEUROLOGIC:  Alert and oriented x3, nonfocal. SKIN:  Dry in appearance. Cervical nodes with no lymphadenopathy.  ASSESSMENT AND PLAN:  This is a 70 year old female with chronic kidney disease stage IV who has had worsening fatigue, lightheadedness, headache x3 days. 1. Headache, lightheadedness, decreased p.o. intake.  She was admitted     on December 3rd to 5th with similar symptoms of nausea and     weakness.  At that time she was uremic with creatinine of 4.6 and     with hydration creatinine trended down to 3.91 on discharge.  Prior     to this episode, her creatinine was so 2.5 to 2.6 at baseline.  She     has also had decreased p.o. intake in the past 3 days.  We do     expect that her renal function will be worsened today.  Her     complaints of fatigue and lightheadedness may likely also be     secondary to anemia due to her chronic kidney disease.  We will     admit her to [facility] and get basic labs, renal panel and CBC.     We will also consult Nephrology while the patient is hospitalized.     She does have a follow-up appointment with Dr. Caryn Section on January 24th,     but given that her current state and current condition is likely     worsening of her renal function, we would like for them to see her     while she is hospitalized. 2. Chronic kidney disease stage IV. See above. 3. Hypertension.  Her blood pressure is elevated today. On arrival to     the clinic her blood pressure is 177/85 and  on recheck manually, it     was 160/88.  The patient has had an elevated blood pressure when     she was seen at the beginning of December. Likely this is from     worsening renal function.  We will continue her on Norvasc 10 mg     and metoprolol 100 mg b.i.d..  At her last discharge in December 5,     Diovan was taking off of her med list secondary to  renal function.     We will also add hydralazine 10 mg q.6h p.r.n. systolic blood     pressure over 180 or diastolic blood pressure over 110 4. Anxiety.  We will continue sertraline 100 mg daily. 5. Urge incontinence.  We will continue oxybutynin. 6. Gout.  She has been taking allopurinol 100 mg daily but given that     she has chronic kidney disease, we will discontinue this     medication.  We will discuss with Nephrology the potential of the     patient taking Uloric for gout prophylaxis. 7. FEN/GI:  We will give her a renal diet.  Will give her 1 liter     normal saline bolus; then continue her fluids with gentle hydration     at 1/2 normal saline 125 mL an hour. 8. Prophylaxis.  Heparin 5000 units subcu q.8h. 9. Code status DNR. Marland Kitchen     Angeline Slim, MD   ______________________________ Paula Compton, MD    CT/MEDQ  D:  06/07/2010  T:  06/07/2010  Job:  [id number] Electronically Edited and Signed  By CAT TA MD on 07/07/2010 05:50:53 PM Electronically Signed by Paula Compton MD on 07/08/2010 11:52:04 AM

## 2010-07-10 NOTE — Consult Note (Signed)
Summary: [name]  [name]   Imported By: De Nurse 07/02/2010 14:43:30  _____________________________________________________________________  External Attachment:    Type:   Image     Comment:   External Document

## 2010-07-19 ENCOUNTER — Ambulatory Visit (INDEPENDENT_AMBULATORY_CARE_PROVIDER_SITE_OTHER): Payer: Medicare Other

## 2010-07-19 DIAGNOSIS — N186 End stage renal disease: Secondary | ICD-10-CM

## 2010-07-19 NOTE — Assessment & Plan Note (Signed)
OFFICE VISIT  [name], [name] DOB:  1941/06/02                                       07/19/2010 UEAVW#:[id number]  CHIEF COMPLAINT:  Followup right arm AV fistula.  The patient is a 70 year old woman who had a right upper arm AV fistula done on 06/12/2010.  She is here for her postop check.  She has no signs of steal.  She can use her hand normally.  She has good strength and sensation.  She has palpable radial pulse.  She has a good thrill and bruit and the fistula though tortuous is easily felt, somewhat on the small side.  It is a brachial cephalic fistula.  PHYSICAL EXAM:  This is a well-developed, well-nourished woman in no acute distress.  Her heart rate is 64, sats are 99, respiratory rate is 10.  Right upper extremity is warm and pink.  She has a good thrill and a bruit in the AV fistula.  Hand is warm and pink.  She has good and equal grip and normal sensation in the hands.  Her wounds are healing well.  ASSESSMENT AND PLAN:  Assessment is maturing right brachial cephalic arteriovenous fistula of a tortuous nature and somewhat small at this point with a good thrill and bruit.  She will follow up in early April prior to using the fistula to check maturation.  Della Goo, PA-C  Fransisco Hertz, MD Electronically Signed  RR/MEDQ  D:  07/19/2010  T:  07/19/2010  Job:  [id number]

## 2010-07-24 ENCOUNTER — Other Ambulatory Visit: Payer: Self-pay | Admitting: Family Medicine

## 2010-07-24 DIAGNOSIS — R110 Nausea: Secondary | ICD-10-CM

## 2010-07-24 DIAGNOSIS — I10 Essential (primary) hypertension: Secondary | ICD-10-CM

## 2010-07-24 MED ORDER — METOCLOPRAMIDE HCL 5 MG/5ML PO SOLN: 5.0000 mg | Freq: Three times a day (TID) | ORAL | Status: DC

## 2010-07-24 MED ORDER — HYDRALAZINE HCL 50 MG PO TABS: 50.0000 mg | ORAL_TABLET | Freq: Three times a day (TID) | ORAL | Status: DC

## 2010-07-24 MED ORDER — CLONIDINE HCL 0.1 MG PO TABS: 0.1000 mg | ORAL_TABLET | Freq: Three times a day (TID) | ORAL | Status: DC

## 2010-08-12 LAB — HEMOGLOBIN A1C
Hgb A1c MFr Bld: 5.5 % (ref <5.7)
Mean Plasma Glucose: 111 mg/dL (ref <117)

## 2010-08-12 LAB — CBC
HCT: 28.6 % — ABNORMAL LOW (ref 36.0–46.0)
HCT: 30 % — ABNORMAL LOW (ref 36.0–46.0)
Hemoglobin: 9.2 g/dL — ABNORMAL LOW (ref 12.0–15.0)
Hemoglobin: 9.5 g/dL — ABNORMAL LOW (ref 12.0–15.0)
MCH: 26.3 pg (ref 26.0–34.0)
MCH: 26.5 pg (ref 26.0–34.0)
MCHC: 31.7 g/dL (ref 30.0–36.0)
MCHC: 32.1 g/dL (ref 30.0–36.0)
MCHC: 32.2 g/dL (ref 30.0–36.0)
MCV: 82.9 fL (ref 78.0–100.0)
MCV: 83.1 fL (ref 78.0–100.0)
MCV: 83.2 fL (ref 78.0–100.0)
MCV: 83.5 fL (ref 78.0–100.0)
Platelets: 172 10*3/uL (ref 150–400)
Platelets: 252 10*3/uL (ref 150–400)
RDW: 16.1 % — ABNORMAL HIGH (ref 11.5–15.5)
RDW: 16.1 % — ABNORMAL HIGH (ref 11.5–15.5)
RDW: 16.3 % — ABNORMAL HIGH (ref 11.5–15.5)
WBC: 5.2 10*3/uL (ref 4.0–10.5)

## 2010-08-12 LAB — COMPREHENSIVE METABOLIC PANEL
ALT: <8 U/L (ref 0–35)
Alkaline Phosphatase: 97 U/L (ref 39–117)
BUN: 29 mg/dL — ABNORMAL HIGH (ref 6–23)
CO2: 19 mEq/L (ref 19–32)
Calcium: 8.3 mg/dL — ABNORMAL LOW (ref 8.4–10.5)
GFR calc non Af Amer: 14 mL/min — ABNORMAL LOW (ref >60)
Glucose, Bld: 104 mg/dL — ABNORMAL HIGH (ref 70–99)
Total Protein: 6.2 g/dL (ref 6.0–8.3)

## 2010-08-12 LAB — CREATININE CLEARANCE, URINE, 24 HOUR
Collection Interval-CRCL: 24 hours
Creatinine Clearance: 24 mL/min — ABNORMAL LOW (ref 75–115)
Creatinine, 24H Ur: 1084 mg/d (ref 700–1800)
Urine Total Volume-CRCL: 4000 mL

## 2010-08-12 LAB — PROTIME-INR
INR: 1.14 (ref 0.00–1.49)
Prothrombin Time: 14.8 seconds (ref 11.6–15.2)

## 2010-08-12 LAB — URINE MICROSCOPIC-ADD ON

## 2010-08-12 LAB — DIFFERENTIAL
Basophils Absolute: 0 10*3/uL (ref 0.0–0.1)
Eosinophils Absolute: 0.3 10*3/uL (ref 0.0–0.7)
Eosinophils Relative: 4 % (ref 0–5)

## 2010-08-12 LAB — URINALYSIS, ROUTINE W REFLEX MICROSCOPIC
Bilirubin Urine: NEGATIVE
Ketones, ur: NEGATIVE mg/dL
Leukocytes, UA: NEGATIVE
Nitrite: NEGATIVE
Specific Gravity, Urine: 1.01 (ref 1.005–1.030)
Urobilinogen, UA: 0.2 mg/dL (ref 0.0–1.0)
pH: 6 (ref 5.0–8.0)

## 2010-08-12 LAB — RENAL FUNCTION PANEL
BUN: 32 mg/dL — ABNORMAL HIGH (ref 6–23)
CO2: 19 mEq/L (ref 19–32)
Calcium: 8.5 mg/dL (ref 8.4–10.5)
Chloride: 115 mEq/L — ABNORMAL HIGH (ref 96–112)
Creatinine, Ser: 3.4 mg/dL — ABNORMAL HIGH (ref 0.4–1.2)
Glucose, Bld: 92 mg/dL (ref 70–99)

## 2010-08-12 LAB — BASIC METABOLIC PANEL
BUN: 38 mg/dL — ABNORMAL HIGH (ref 6–23)
Chloride: 116 mEq/L — ABNORMAL HIGH (ref 96–112)
Creatinine, Ser: 3.61 mg/dL — ABNORMAL HIGH (ref 0.4–1.2)
GFR calc non Af Amer: 13 mL/min — ABNORMAL LOW (ref >60)

## 2010-08-12 LAB — URINE CULTURE
Culture  Setup Time: 201112041154
Special Requests: NEGATIVE

## 2010-08-12 LAB — CARDIAC PANEL(CRET KIN+CKTOT+MB+TROPI): Total CK: 52 U/L (ref 7–177)

## 2010-08-12 LAB — POCT I-STAT, CHEM 8
BUN: 45 mg/dL — ABNORMAL HIGH (ref 6–23)
Calcium, Ion: 1.21 mmol/L (ref 1.12–1.32)
Creatinine, Ser: 4.1 mg/dL — ABNORMAL HIGH (ref 0.4–1.2)
TCO2: 23 mmol/L (ref 0–100)

## 2010-08-12 LAB — POCT CARDIAC MARKERS

## 2010-08-12 LAB — PTH, INTACT AND CALCIUM: PTH: 482.5 pg/mL — ABNORMAL HIGH (ref 14.0–72.0)

## 2010-08-12 LAB — IRON AND TIBC: Iron: 35 ug/dL — ABNORMAL LOW (ref 42–135)

## 2010-08-12 LAB — PROTEIN, URINE, 24 HOUR
Collection Interval-UPROT: 24 hours
Urine Total Volume-UPROT: 4000 mL

## 2010-08-20 ENCOUNTER — Other Ambulatory Visit: Payer: Self-pay | Admitting: Family Medicine

## 2010-08-20 MED ORDER — SERTRALINE HCL 100 MG PO TABS: 100.0000 mg | ORAL_TABLET | Freq: Every day | ORAL | Status: DC

## 2010-08-22 ENCOUNTER — Encounter: Payer: Self-pay | Admitting: Family Medicine

## 2010-08-22 ENCOUNTER — Ambulatory Visit (INDEPENDENT_AMBULATORY_CARE_PROVIDER_SITE_OTHER): Payer: Medicare Other | Admitting: Family Medicine

## 2010-08-22 DIAGNOSIS — M25559 Pain in unspecified hip: Secondary | ICD-10-CM

## 2010-08-22 DIAGNOSIS — R630 Anorexia: Secondary | ICD-10-CM

## 2010-08-22 DIAGNOSIS — G4700 Insomnia, unspecified: Secondary | ICD-10-CM

## 2010-08-22 NOTE — Patient Instructions (Signed)
Try using the heating pad on your hip at night.  The maximum dose of Melatonin is 10 mg.  The maximum dose of Tramadol is 200 mg in 1 day.  You can try some Tylenol Arthritis for the hip pain.  Come back to see me in June.

## 2010-08-25 NOTE — Progress Notes (Signed)
Insomnia: Pt continues to have some insomnia but also has a hard time waking up in the morning. She has been taking some Melatonin and wonders if this is ok. She says she just has a lot of fatigue but does not feel depressed. She has had her thyroid checked recently at her Renal doctors office. She reports it being normal.   Loss of appetite: Pt has had a loss of appetite. She has not lost any significant amount of weight but the patient reports not eating very much. She has even thought about getting some cans of ensure. She feels tired and does not want to get out of the bed to make food. The only thing that keeps her going is that she has to get up to make food for her "daughter" who needs her to make breakfast every morning.   Left hip pain: Pt has continued left hip pain that she has had for a long time. Her pain is often bilateral but seems to be hurting more on the left side lately. She is using the heating pad and Tramadol.   ROs: neg except as noted in HPI.   Pe:  Gen: sitting comfortably in the exam chair. NAD, obese.  CV: RRR, no murmur Pulm: CTAB, no crackles or wheezes.  Psych: Pt has some signs of depressed affect and mood,

## 2010-08-26 ENCOUNTER — Other Ambulatory Visit: Payer: Self-pay | Admitting: Family Medicine

## 2010-08-26 DIAGNOSIS — I10 Essential (primary) hypertension: Secondary | ICD-10-CM

## 2010-08-26 DIAGNOSIS — G4700 Insomnia, unspecified: Secondary | ICD-10-CM | POA: Insufficient documentation

## 2010-08-26 MED ORDER — CLONIDINE HCL 0.1 MG PO TABS: 0.1000 mg | ORAL_TABLET | Freq: Three times a day (TID) | ORAL | Status: DC

## 2010-08-26 MED ORDER — HYDRALAZINE HCL 50 MG PO TABS: 50.0000 mg | ORAL_TABLET | Freq: Three times a day (TID) | ORAL | Status: DC

## 2010-08-26 NOTE — Assessment & Plan Note (Signed)
Pt has a weight change of about 2-3 lbs lost. Not a significant amount which indicates that she is getting enough calories per day. She does feel like she has a loss of appetie. I suggested drinking some liquid meal replacement if she thinks she is not getting enough nutrients.

## 2010-08-26 NOTE — Assessment & Plan Note (Signed)
Pt says she is having a hard time falling asleep and is awakening at night as well. She is very tired in the morning and having a hard time getting out of bed. She wants to know if Melatonin would work.  Advised to take up to 10 mg of Melatonin in a 24 hr period. She can try this for her insomnia.

## 2010-08-26 NOTE — Assessment & Plan Note (Signed)
Pt is taking the max dose of Tramadol and wants to keep taking it.  Suggested she use the heating pad and Tylenol arthritis as well if she needs more pain medicine.

## 2010-09-03 ENCOUNTER — Ambulatory Visit (INDEPENDENT_AMBULATORY_CARE_PROVIDER_SITE_OTHER): Payer: Medicare Other | Admitting: Vascular Surgery

## 2010-09-03 DIAGNOSIS — N186 End stage renal disease: Secondary | ICD-10-CM

## 2010-09-04 ENCOUNTER — Encounter: Payer: Self-pay | Admitting: Home Health Services

## 2010-09-04 NOTE — Assessment & Plan Note (Signed)
OFFICE VISIT  [name], [name] DOB:  February 24, 1941                                       09/03/2010 [id number]#:[id number]  This patient returns today for continued follow-up regarding the right brachial cephalic AV fistula which I created on January 11 for end-stage renal disease.  The patient has never had any hemodialysis performed. She no longer has a Diatek catheter.  She saw Dr. Caryn Section about 1 month ago. He will be seeing him again in May.  PHYSICAL EXAMINATION:  Today blood pressure is 139/60, heart rate 63, respirations 24.  Right upper extremity has an excellent radial pulse. The fistula has an excellent pulse and palpable thrill and is easily palpable just beneath the skin and seems to be of good caliber.  I think the fistula is maturing nicely and I think will be a good site for vascular access in the future.  I have instructed her to continue to exercise with the rubber ball, gripping with the right hand.  In the future, she will return to see [place] on a p.r.n. basis.    [name] [place], M.D. Electronically Signed  JDL/MEDQ  D:  09/03/2010  T:  09/04/2010  Job:  [id number]

## 2010-09-09 LAB — DIFFERENTIAL
Basophils Absolute: 0 10*3/uL (ref 0.0–0.1)
Basophils Relative: 0 % (ref 0–1)
Eosinophils Absolute: 0.4 10*3/uL (ref 0.0–0.7)
Monocytes Relative: 7 % (ref 3–12)
Neutro Abs: 7.1 10*3/uL (ref 1.7–7.7)
Neutrophils Relative %: 72 % (ref 43–77)

## 2010-09-09 LAB — CBC
HCT: 38.9 % (ref 36.0–46.0)
Hemoglobin: 12.8 g/dL (ref 12.0–15.0)
RBC: 4.65 MIL/uL (ref 3.87–5.11)
RDW: 17.8 % — ABNORMAL HIGH (ref 11.5–15.5)

## 2010-09-09 LAB — COMPREHENSIVE METABOLIC PANEL
ALT: <8 U/L (ref 0–35)
Alkaline Phosphatase: 118 U/L — ABNORMAL HIGH (ref 39–117)
BUN: 31 mg/dL — ABNORMAL HIGH (ref 6–23)
CO2: 23 mEq/L (ref 19–32)
Chloride: 108 mEq/L (ref 96–112)
GFR calc non Af Amer: 17 mL/min — ABNORMAL LOW (ref >60)
Glucose, Bld: 88 mg/dL (ref 70–99)
Potassium: 4 mEq/L (ref 3.5–5.1)
Sodium: 143 mEq/L (ref 135–145)
Total Bilirubin: 0.4 mg/dL (ref 0.3–1.2)
Total Protein: 7.6 g/dL (ref 6.0–8.3)

## 2010-09-24 ENCOUNTER — Ambulatory Visit (INDEPENDENT_AMBULATORY_CARE_PROVIDER_SITE_OTHER): Payer: Medicare Other | Admitting: Family Medicine

## 2010-09-24 ENCOUNTER — Encounter: Payer: Self-pay | Admitting: Family Medicine

## 2010-09-24 VITALS — BP 169/83 | HR 68 | Temp 97.7°F | Ht 62.0 in | Wt 170.0 lb

## 2010-09-24 DIAGNOSIS — H1130 Conjunctival hemorrhage, unspecified eye: Secondary | ICD-10-CM | POA: Insufficient documentation

## 2010-09-24 DIAGNOSIS — J329 Chronic sinusitis, unspecified: Secondary | ICD-10-CM | POA: Insufficient documentation

## 2010-09-24 DIAGNOSIS — N184 Chronic kidney disease, stage 4 (severe): Secondary | ICD-10-CM

## 2010-09-24 MED ORDER — FLUTICASONE PROPIONATE 50 MCG/ACT NA SUSP: 1.0000 | Freq: Every day | NASAL | Status: DC

## 2010-09-24 NOTE — Progress Notes (Signed)
  Subjective:    Patient ID: [name], female    DOB: [birth year], 70 y.o.   MRN: [id number]  HPI Painless redness of left eye occurred spontaneously, thinks that maybe she wiped some blood on the tissue but now is clear.  Muffled hearing on the right.  Allergies have been bad she does not have any nasal spray, uses flonase.    Chronic sinus problems for years with history of invasive sinus surgery.  Stage VI CKD with dialysis likely to begin soon.  Has not been to an eye doctor in many years, did have cataracts removed by Dr. Lavona Mound and associates in the past.   Review of Systems  Constitutional: Negative for fever, chills and unexpected weight change.  HENT: Positive for ear pain and rhinorrhea. Negative for congestion, sore throat, sneezing, neck pain, postnasal drip, tinnitus and ear discharge.   Eyes: Positive for redness. Negative for photophobia, pain, discharge, itching and visual disturbance.  Respiratory: Negative for cough.        Objective:   Physical Exam  Constitutional:       Older, chronically ill appearing.  HENT:       Right TM pink with distortion of light reflex, and loss of low frequency hearing at 40 db.  Left ear with some wax in canal and normal TM and hearing.  Eyes:       Left subconjunctival hemorrhage, eye tearing clear, no exudate   Cardiovascular: Normal rate, regular rhythm and normal heart sounds.        AV fistula in right arm with good bruit  Pulmonary/Chest: Effort normal and breath sounds normal.          Assessment & Plan:

## 2010-09-24 NOTE — Patient Instructions (Addendum)
Subconjunctival Hemorrhage A subconjunctival hemorrhage is a bright red patch covering a portion of the white of the eye. The white part of the eye is called the sclera, and it is covered by a thin membrane called the conjunctiva. This membrane is clear, except for tiny blood vessels that you can see with the naked eye. When your eye is irritated or inflamed and becomes red, it is because the vessels in the conjunctiva are swollen. Sometimes, a blood vessel in the conjunctiva can break and bleed. When this occurs, the blood builds up between the conjunctiva and the sclera, and spreads out to create a red area. The red spot may be very small at first. It may then spread to cover a larger part of the surface of the eye, or even all of the visible white part of the eye. In almost all cases, the blood will go away and the eye will become white again. Before completely dissolving, however, the red area may spread. It may also become brownish-yellow in color, before going away. If a lot of blood collects under the conjunctiva, it may look like a bulge on the surface of the eye. This looks scary, but it will also eventually flatten out and go away. Subconjunctival hemorrhages do not cause pain, but if swollen, may cause a feeling of irritation. There is no effect on vision.  CAUSES  The most common cause is mild trauma (rubbing the eye, irritation).   Subconjunctival hemorrhages can happen because of coughing or straining (lifting heavy objects), vomiting, or sneezing.   In some cases, your doctor may want to check your blood pressure. High blood pressure can also cause a sunconjunctival hemorrhage.   Severe trauma or blunt injuries.   Diseases that affect blood clotting (hemophilia, leukemia).   Abnormalities of blood vessels behind the eye (carotid cavernous sinus fistula).   Tumors behind the eye.   Certain drugs (aspirin, coumadin, heparin).   Recent eye surgery.  HOME CARE INSTRUCTIONS  Do not  worry about the appearance of your eye. You may continue your usual activities.   Often, follow-up is not necessary.  SEEK MEDICAL CARE IF:  Your eye becomes painful.   The bleeding does not disappear within 3 weeks.   Bleeding occurs elsewhere, for example, under the skin, in the mouth, or in the other eye.   You have recurring subconjunctival hemorrhages.  SEEK IMMEDIATE MEDICAL CARE IF:  Your vision changes or you have difficulty seeing.   You develop severe headache, persistent vomiting, confusion, or abnormal drowsiness (lethargy).   Your eye seems to bulge or protrude from the eye socket.   You notice the sudden appearance of bruises, or have spontaneous bleeding elsewhere on your body.  Document Released: 05/19/2005 Document Re-Released: 08/13/2009 [facility] Patient Information 2011 [place], [place].  Begin the Flonase daily for you allergies and chronic sinus problems Purchase loratadine 10 mg (generic Claritin) and take daily to control the tearing, this should be very safe You have fluid behind your right ear, please recheck 2 weeks with Dr. Clotilde Dieter

## 2010-09-24 NOTE — Assessment & Plan Note (Signed)
Reassurance, information given, feel that she needs to see her eye doctor as she has Stange IV CKD and has not had an eye exam in years.  Referral  Placed to make apt for her.

## 2010-09-24 NOTE — Assessment & Plan Note (Signed)
Add nasal steroid and OTC loratadine 10 mg, recheck hearing and ear in 2 weeks with primary MD

## 2010-10-07 ENCOUNTER — Telehealth: Payer: Self-pay | Admitting: Family Medicine

## 2010-10-07 NOTE — Telephone Encounter (Signed)
Pt asking to speak with RN about having an ear infection, pt declined an appt.

## 2010-10-07 NOTE — Telephone Encounter (Signed)
Returned call.  No answer so left a VMM for her to call us back.

## 2010-10-11 ENCOUNTER — Encounter: Payer: Self-pay | Admitting: Family Medicine

## 2010-10-11 ENCOUNTER — Ambulatory Visit (INDEPENDENT_AMBULATORY_CARE_PROVIDER_SITE_OTHER): Payer: Medicare Other | Admitting: Family Medicine

## 2010-10-11 DIAGNOSIS — H9190 Unspecified hearing loss, unspecified ear: Secondary | ICD-10-CM

## 2010-10-11 DIAGNOSIS — H6690 Otitis media, unspecified, unspecified ear: Secondary | ICD-10-CM | POA: Insufficient documentation

## 2010-10-11 DIAGNOSIS — H65499 Other chronic nonsuppurative otitis media, unspecified ear: Secondary | ICD-10-CM

## 2010-10-11 DIAGNOSIS — R21 Rash and other nonspecific skin eruption: Secondary | ICD-10-CM

## 2010-10-11 DIAGNOSIS — N184 Chronic kidney disease, stage 4 (severe): Secondary | ICD-10-CM

## 2010-10-11 MED ORDER — LORATADINE 10 MG PO TABS: 10.0000 mg | ORAL_TABLET | Freq: Every day | ORAL | Status: DC | PRN

## 2010-10-11 MED ORDER — CEFDINIR 300 MG PO CAPS: 600.0000 mg | ORAL_CAPSULE | Freq: Every day | ORAL | Status: AC

## 2010-10-11 NOTE — Assessment & Plan Note (Signed)
Asking pt to increase fiber intake without increasing water intake to help her stools bulk up.  She has not started this yet but plans to. Asked the pt to come in to get BMET done 1 week after she starts it.

## 2010-10-11 NOTE — Patient Instructions (Signed)
Your new meds are Claritin and Omnicef. You can get the generic versions of this.

## 2010-10-11 NOTE — Progress Notes (Signed)
Hearing difficulty: Pt is having some difficulty hearing x 4 weeks. She says she was seen a few weeks ago and there was some redness was seen in her ear that day. She was advised to get some loratadine and but on a nasal steroid. 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 She says all has been fine for the last 10 years but she never had pain with the first sinus infection and is concerned that this ear fullness and clear fluid draining from her nose could indicate another infection. She is having some watery eyes, raspy voice, popping in her ears, can't hear some noises.   ROS: neg except for continued diarrhea which is chronic.   PE:  Gen: NAD, sitting comfortably HEENT: [place]/AT, PERR, EOMI, no drainage noted, no erythema, Tm's appear normal without bulging but the left TM has a grey color whereas the Rt TM has 3 tiny blisters on the TM in the LUQ. No nasal drainage noted, dentures in place, no erythema in posterior pharynx.

## 2010-10-11 NOTE — Assessment & Plan Note (Signed)
  Pt has some decrease in hearing, plan to treat with Omnicef 600 mg a day. Plan to also ask pt to keep using allergy meds like allegra, claritin or zyrtec.

## 2010-10-15 NOTE — H&P (Signed)
NAME:  GRACIELA, PLATO NO.:  [id number]   MEDICAL RECORD NO.:  [id number]          PATIENT TYPE:  OUT   LOCATION:  EKG                          FACILITY:  [facility]   PHYSICIAN:  Alanson Puls, M.D.    DATE OF BIRTH:  1940-06-26   DATE OF ADMISSION:  12/10/2006  DATE OF DISCHARGE:                              HISTORY & PHYSICAL   CHIEF COMPLAINT:  Atypical chest pain/throat pain.   HISTORY OF PRESENT ILLNESS:  Miss Grzesiak is a 70 year old female with a  significant past medical history who presents today for  substernal/throat pressure that has been persistent for about 1 to 2  weeks.  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  The patient  states that the pain is ongoing at rest and is not associated with  meals.  She has not had to take any nitroglycerin for this pain.  She  has also noted some dysphagia and states that things seem to get stuck  in her throat as well as her midchest, especially pills and solid foods.  This is less likely with liquids.  She has seen Dr. Randa Evens,  gastroenterology in the past and diagnosed with hiatal hernia and states  that she had an upper GI as well as a colonoscopy done in the past.  Uncertain of these dates and we do not have records at this time.  She  has taken Protonix with no more relief of her symptoms.  She denies  hematemesis and she has stopped lisinopril in the past secondary to a  cough.  The patient has also seen Dr. Algie Coffer in the past and had a cath  in 2003 which revealed nonobstructive disease but she has been lost to  followup since this time.   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   History of iron-deficiency anemia, history of seizure  disorder, history of sinusitis with pharyngitis in 2002.  History of  right trochanteric hip bursitis and is on chronic steroids secondary to  this.   MEDICATIONS:  1. Allopurinol 100 mg daily.  2. Aspirin 81 mg daily.  3. Lipitor 40 mg daily.  4. Lyrica 25 mg daily.  5. Metoprolol 100 mg daily.  6. Micardis 80 mg daily.  7. Norvasc 10 mg daily.  8. The patient has been on prednisone 5 mg in the past but states she      has been off prednisone since October 2008.  9. Ultram 50 mg 1 tablet every 6 hours as needed for pain.  10.Zantac/Protonix.  The patient seems to alternate between these 2.   ALLERGIES:  CODEINE WHICH CAUSES A RASH, LISINOPRIL WHICH CAUSES COUGH,  PENICILLIN, UNSPECIFIED AND SULFA UNSPECIFIED.   SOCIAL HISTORY:  The patient lives in [place] and has a daughter,  admits to ongoing tobacco  abuse.  Denies any alcohol or illicit drug  use.   PHYSICAL EXAMINATION:  VITAL SIGNS:  Blood pressure 176/79, heart rate  79, temperature 98, saturating 99% on room air.  GENERAL:  The patient is overweight appearing but in no acute distress.  States she is not having active chest pains at this time.  CARDIAC:  Distant S1 and S2.  No murmurs noted.  No carotid bruits  noted.  PULMONARY:  Clear to auscultation bilaterally.  No wheezing.  The  patient is noted to have a prolonged expiratory phase.  ABDOMEN:  Obese, nontender.  No bruits appreciated.  EXTREMITIES:  No lower extremity edema or clubbing noted.   IMPRESSION:  Atypical chest pain, confirmed, unstable angina,  uncontrolled hypertension, dysphagia. 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  1. Atypical chest pain.  There is concern for unstable angina with new      onset and constant for 2 weeks' duration.  The patient is not      receiving nitroglycerin but has multiple risk factors including       tobacco, hypercholesterolemia, uncontrolled hypertension.  She had      a cardiac catheterization in 2003 with Dr. Algie Coffer which revealed      nonocclusive disease.  Her EKG in office today was normal sinus      rhythm but had no acute ST-T wave changes.  We did restart the      patient's beta blocker, metoprolol 100 mg p.o. as well as gave her      aspirin 325 mg.  We will start heparin per pharmacy, obtain PT,      INR, CBC, C-MET, TSH and a urine drug screen and cardiac markers      q.8h. times 3.  We will also obtain chest x-ray to rule out any      pulmonary process that may be contributing.  Of note the patient      may need a CT of the chest in the future if symptoms do persist to      evaluate for dissection versus pulmonary embolus, however, would      premedicate and hydrate given her chronic kidney disease.  2. For uncontrolled hypertension, the patient admits that she has not      taken her medicines today secondary to dysphagia.  Restart her home      medications metoprolol, Norvasc as well as her ARB.  Obtain a urine      drug screen and TSH.  We will check a urinalysis for proteinuria to      insure the patient is not in nephrotic range given her history of      focal segmental glomerulosclerosis.  She has no signs of      hypertensive urgency today.  3. For dysphagia.  The patient does have a history of gastroesophageal      reflux disease and hiatal hernia, seen by Dr. Randa Evens in the past.      States she had an upper GI but we did not have records. We will      attempt to obtain these records.  Given her story I am concerned      for mechanical obstruction including esophageal ring, chronic      gastroesophageal reflux disease versus malignancy given her long-      standing history of tobacco.  She will likely need a repeat      esophagogastroduodenoscopy versus a barium swallow in the future,  however, her dysphagia symptoms could also be referred pain from       unstable angina.  4. For her chronic renal insufficiency, the patient is not dialysis      dependent and has a history of focal segmental glomerulosclerosis      and states that she has been off steroids since October 2008.  We      will check a C-MET a urinalysis, assure the patient is not      nephrotic range and a cholesterol panel.  We will renal dose adjust      all her meds, her baseline creatinine is 2 since 2007 and she has      chronic kidney disease stage III.  The patient is not on ACE      therapy secondary to cough but should continue on her ARB.  Given      the patient has been on chronic steroids in the past, keep in mind      that she may have symptoms of adrenal insufficiency.  5. Fluids, electrolytes and nutrition.  We will saline lock IV fluids.      Electrolytes are pending at this time.  We will make her n.p.o.      until further notice.  6. We will prophylax this patient.  Continue the patient on her proton      pump inhibitor and she is to continue on heparin at this time.      Concern for unstable angina.  7. Also advise that the patient receive a smoking cessation consult.     Alanson Puls, M.D.  Electronically Signed    MR/MEDQ  D:  12/10/2006  T:  12/11/2006  Job:  [id number]

## 2010-10-15 NOTE — H&P (Signed)
NAMEJANAN, BOGIE NO.:  [id number]   MEDICAL RECORD NO.:  [id number]          PATIENT TYPE:  INP   LOCATION:  5153                         FACILITY:  [facility]   PHYSICIAN:  Nestor Ramp, MD        DATE OF BIRTH:  29-Mar-1941   DATE OF ADMISSION:  12/22/2007  DATE OF DISCHARGE:                              HISTORY & PHYSICAL   PRIMARY CARE PHYSICIAN:  Jamie Brookes, MD   CHIEF COMPLAINT:  Abdominal pain and dehydration.   HISTORY OF PRESENT ILLNESS:  A 70 year old female presenting with 5-day  history of diarrhea and now with acute on chronic renal insufficiency.  He has been treated for cystitis with multiple antibiotics from several  different physicians - Urology, Urgent Care, and Family Medicine.  In  some combination over the past several weeks, she has taken  metronidazole, ciprofloxacin, and nitrofurantoin - again, not clear  which physicians have prescribed which antibiotic and for what duration.  She presented to Urology for evaluation of bladder prolapse and sent to  [facility] ED after complaining of constant left lower quadrant pain to  be evaluated for diverticulitis.  In the emergency department, she then  complained of 5 days of nonbloody, brown, liquid diarrhea (6-8 loose  stools daily), which are postprandial.  She also complains of dysuria  and urinary urgency, headache, intermittent angina, but denies vomiting.  She had a creatinine of 2.52 in the emergency department and the patient  states that her baseline creatinine is 1.7; however, a review of the  electronic medical record shows her creatinine values to be persistently  in the 2.4-2.5 range since at least October 2008.   PAST MEDICAL HISTORY:  1. Chronic renal insufficiency.  2. Irritable bowel syndrome.  3. Anxiety.  4. Depression.  5. Chronic back pain.  6. Diverticulosis.  7. Gout.  8. Hemorrhoids.  9. Hyperlipidemia.  10.Hypertension.   FAMILY HISTORY:  One sister with  thyroid cancer.  One sister deceased  from breast cancer.  One sister recently deceased from kidney failure.  Mother deceased from thyroid cancer.   SOCIAL HISTORY:  Did not obtain, but review of the electronic medical  record reveals a complicated social history.   ALLERGIES:  CODEINE, LYRICA, PENICILLIN, and SULFA.   MEDICATIONS:  Allopurinol, aspirin, Lipitor, metoprolol, Norvasc,  tramadol, Zoloft, Diovan, Prilosec, Aricept, Allegra, and Darvocet.   REVIEW OF SYSTEMS:  As per HPI.   PHYSICAL EXAMINATION:  VITAL SIGNS:  Temperature 98.3, heart rate 61-88,  respiration rate 18-20, blood pressure 137-174/68-83, and saturations 97-  99% on room air.  GENERAL:  Alert and oriented x3, appears acutely distressed initially,  but effervescent to describe her HPI and past medical history.  HEENT:  Dry mucous membranes.  CARDIAC:  Regular rate and rhythm without murmurs, rubs or gallops.  PULMONARY:  Clear to auscultation bilaterally with normal work of  breathing.  ABDOMEN:  Hyperactive bowel sounds, diffusely rigid and tender on exam  but able to sit up and walk without apparent discomfort.  EXTREMITIES:  Extremely tender to palpation over her  bilateral tibia.  No edema.   LABS AND RADIOLOGY:  CBC:  White blood count 7.7, hemoglobin 11.5,  hematocrit 34.4, platelets 280.  BMET:  Sodium 142, potassium 4.0,  chloride 111, bicarb 21, BUN 33, creatinine 2.52, and glucose 94.  Liver  function tests are within normal limits.  Urinalysis significant for  specific gravity of 1.018, a pH of 5.0, 100 protein, nitrite negative,  and leukocyte negative.  CT of the abdomen and pelvis without contrast  shows no acute abnormality.  There is a small midline ventral hernia  containing only fat.  There are also multiple colonic diverticula  predominantly in the rectosigmoid colon but without diverticulitis.   ASSESSMENT AND PLAN:  This is a 70 year old female presenting with  diarrhea x5 days.  1.  Diarrhea.  Given her recent antibiotic use, we would be concerned      for Clostridium difficile colitis.  We will check Clostridium      difficile stool x2.  2. Abdominal pain.  No sign of diverticulitis.  Pantoprazole IV for      now.  3. Chronic renal insufficiency.  Original report from the emergency      department was that the patient had acute on chronic renal      insufficiency, but she actually seems to be at her baseline      creatinine at this point.  4. Hypertension.  Continue home doses of amlodipine and metoprolol.      They have held her ACE inhibitor and her ARB given concern for      acute renal failure, but these can be restarted once we are certain      she is well-hydrated.  5. Hyperlipidemia.  Continue home dose of atorvastatin.  6. Anxiety.  Continue home dose of sertraline.  7. Chronic pain.  Continue home dose of tramadol.  8. Fluids, electrolytes, nutrition/gastrointestinal.  The patient      appears dehydrated.  We will bolus with normal saline 1000 mL x1,      then maintenance IV fluids will be D5 half-normal saline at 125      mL/hour.  The patient is hungry and asking for food, we will      advance diet as tolerated.  9. Prophylaxis.  The patient is ambulating.   DISPOSITION:  Pending workup of diarrhea.      Romero Belling, MD  Electronically Signed      Nestor Ramp, MD  Electronically Signed    MO/MEDQ  D:  12/22/2007  T:  12/22/2007  Job:  [id number]

## 2010-10-15 NOTE — Discharge Summary (Signed)
[name], [name] NO.:  [id number]   MEDICAL RECORD NO.:  [id number]          PATIENT TYPE:  OBV   LOCATION:  5153                         FACILITY:  [facility]   PHYSICIAN:  Santiago Bumpers. Hensel, M.D.DATE OF BIRTH:  [birth year]   DATE OF ADMISSION:  12/22/2007  DATE OF DISCHARGE:  12/23/2007                               DISCHARGE SUMMARY   PRIMARY CARE [name]:  Brianna Brookes, MD, at the [facility].   DISCHARGE DIAGNOSES:  1. Diarrhea.  2. Anemia.  3. Abdominal pain.  4. Chronic renal insufficiency.  5. Acute renal failure.  6. Hypertension.  7. Hyperlipidemia.  8. Anxiety.  9. Chronic pain.   DISCHARGE MEDICATIONS:  1. Flagyl 500 mg p.o. three times daily for 7 days, if the patient      were to start having severe diarrhea.  2. Allopurinol 100 mg one-half tablet daily.  3. Aspirin 81 mg 1 daily.  4. Lipitor 40 mg p.o. nightly.  5. Metoprolol XL 100 mg p.o. daily.  6. Norvasc 10 mg p.o. daily.  7. Tramadol 50 mg 1 tablet daily.  8. Allegra 180 mg p.o. daily.  9. Protonix 40 mg p.o. daily.  10.Aricept 10 mg p.o. daily.  11.Sertraline HCl 100 mg p.o. daily.  12.Lisinopril 20 mg p.o. daily.  13.Diovan 160 mg p.o. daily.   CONSULTS:  None.   PROCEDURES:  CT scan of the abdomen and pelvis on December 21, 2007, showed  no acute abnormality.  There is small midline ventral hernia containing  only fat, also showed multiple colonic diverticula with concentration  within the rectosigmoid colon.  No diverticulitis.  No acute  abnormality.   LABS:  On discharge, sodium 140, potassium 3.9, chloride 114, bicarb 21,  BUN 20, creatinine 1.8, calcium 9.3.  CBC with white blood cells 6.5,  hemoglobin 10.8, hematocrit 32.8, and platelet 228.  C. diff culture was  negative x1.  The UA showed 5000 colonies but otherwise insignificant  growth.  A hemoccult was pending.   BRIEF HOSPITAL COURSE:  This is a 70 year old female who was admitted  for diarrhea  x5 days.  1. Diarrhea.  The patient had been on multiple courses of antibiotics,      possibly 3 different antibiotics in the past 1-1/2 months.  Given      the diarrhea plus multiple uses of antibiotic, we began to treat      her empirically with Flagyl 500 mg p.o. t.i.d., but the patient has      refused this medication.  During her hospitalization, she has been      stable and afebrile and her white blood cell has been stable.  We      have cultured her fecal matter for C. diff and was given 1 negative      culture.  The patient was n.p.o. and given IV fluids for      maintenance.  She began to feel better and we advanced her diet      slowly to clear liquid, full liquids, and regular diet.  The  patient was able to tolerate p.o. and continued to have smaller      bouts of diarrhea.  Diarrhea was lesser in quality and quantity      than prior to admission.  The patient was deemed stable for      discharge on December 23, 2007.  The patient was given a prescription      for Flagyl and was instructed to take it if she starts to have      severe diarrhea again.  2. Anemia.  The patient did have a decrease in hemoglobin and      hematocrit with the admission hemoglobin 11.5 and hematocrit of      34.4.  Her decrease in hemoglobin and hematocrit was most likely      secondary to hydration.  She came in with multiple bouts of      diarrhea x5 days and so she was most likely dehydrated.  We have      rehydrated her and the decrease in hemoglobin and hematocrit was      most likely secondary to dilutional effect.  3. Abdominal pain.  Abdominal CT showed midline hernia containing fat      and multiple diverticula, no diverticulitis.  The patient was      continued on home dose of Protonix.  4. Chronic renal insufficiency.  Her baseline creatinine is between      1.7-1.9 going back to 2004.  The patient's admitted creatinine was      2.53 and trended down to 1.72 and 1.8.  Currently, she is at  her      baseline creatinine.  The increase in creatinine was most likely      due to dehydration from multiple bouts of diarrhea.  5. Acute renal failure.  This resolved after hydration and she was      discharged on with the creatinine that is back to her baseline.  6. Hypertension.  The patient will continue on home doses of Norvasc      10 mg p.o. daily and metoprolol XL 100 mg p.o. daily.  7. Hyperlipidemia.  The patient will take home dose Lipitor 40 mg p.o.      nightly.  8. Anxiety.  The patient is to continue home dose of Zoloft 100 mg      p.o. daily.  9. Chronic pain.  The patient is to continue on tramadol 50 mg p.o.      daily.   DISCHARGE INSTRUCTIONS:  The patient is discharged home with activity to  be increased slowly.  She is discharged with a diet of low-sodium, heart-  healthy diet.   FOLLOWUP APPOINTMENTS:  Return to see Dr. Jamie [name] at the [facility] on January 03, 2008, at 10:30.   DISCHARGE CONDITION:  The patient was discharged home in stable medical  condition.      Angeline Slim, MD   Electronically Signed     ______________________________  Santiago Bumpers Leveda Anna, M.D.    CT/MEDQ  D:  12/24/2007  T:  12/25/2007  Job:  [id number]   cc:   Brianna Brookes, MD

## 2010-10-18 NOTE — Consult Note (Signed)
[place]  Patient:    [name], [name]                    MRN: [id number] Proc. Date: 09/30/00 Adm. Date:  [id number] Disc. Date: [id number] Attending:  Sandi Raveling                          Consultation Report  REASON FOR CONSULTATION:  Evaluate patient with seizure disorder, cerebritis, and sinus disease.  HISTORY OF PRESENT ILLNESS:  [name] is a 70 year old black female who recent presented to [facility] ER because of a seizure disorder.  She underwent MRI and CT scans, which showed left frontal cerebritis and left frontal ethmoid sinus disease.  She had had a previous history of meningitis in 1993 secondary to ethmoid sinus disease and underwent left ethmoid sinus surgery in 1993 by myself.  She had done reasonably well.  No real headaches, no drainage from the nose, but recently developed the seizure problem found to be secondary to a cerebritis, which appears to be arising from frontal ethmoid sinus disease.  On review of the CT scans, the patient had a CT scan in 1997 which showed some partial obstruction in the left frontal ethmoid area.  This is advanced and shows more significant obstruction on the more recent CT scan. Her MRI scan shows the same obstruction in the left frontal ethmoid region, as well as changes of cerebritis around the left posterior frontal sinus region. On exam, nasal passages were clear.  There is no drainage from the nose.  The patient seems reasonably alert, awake.  IMPRESSION: 1. 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 2. 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 3. History of meningitis status post left ethmoidectomy 12 years ago.  RECOMMENDATIONS:  We would recommend continued medical therapy.  Most likely will require surgical intervention to drain the left ethmoid frontal region. We will need to repeat CT scan.  Surgically, it would be better performed if able to use  Instatract as an outpatient and can arrange this through my office if the patient is stable medically and able to be discharged.  Recommend follow-up in my office and can arrange CT scan and surgery through the office. If the patient unable to be discharged or felt that intervention would be better done acutely, could perform this while hospitalized. DD:  09/30/00 TD:  10/01/00 Job: [id number]

## 2010-10-18 NOTE — Consult Note (Signed)
[facility]  Patient:    [name], [name]                       MRN: [id number] Proc. Date: 09/02/00 Adm. Date:  [id number] Attending:  Jeannette Corpus CC:         Sibyl Parr Darrick Penna, M.D.  Evette Georges, M.D.  Guadalupe Dawn, M.D.  Telford Nab, R.N.   Consultation Report  HISTORY OF PRESENT ILLNESS:  [age]-year-old African-American female referred by Dr. Royal Hawthorn B. Fields for evaluation of newly diagnosed pelvic mass.  The patient has a long-standing history of irritable bowel syndrome and a more recent history of lower quadrant pain associated with diarrhea and constipation.  In addition, she gives me a lengthy history of back pain and leg pains.  Apparently, her pain was thought to be associated with irritable bowel syndrome but in the course of evaluation, she underwent an ultrasound of the pelvis which showed a 4.5 x 2.0 x 3.4-cm complex mass in the right adnexal area with some surrounding free fluid.  The left adnexa was more difficult to evaluate, although there were two solid areas which could represent ovaries measuring 2 x 1 cm and 1.3 cm.  Patient denies any other significant gynecologic history, although she had a hysterectomy in the past for uterine fibroids.  PAST MEDICAL HISTORY:  Medical illnesses:  Hypertension, GERD, irritable bowel syndrome, chronic renal failure (recent creatinine was 1.3), osteoarthritis, low back pain.  Patient apparently has focal segmental glomerulosclerosis.  PAST SURGICAL HISTORY:  Hysterectomy for fibroids, cholecystectomy, tonsils and adenoidectomy, appendectomy, resection of back cysts, surgery for hammertoes.  CURRENT MEDICATIONS:  1. Atacand 16 mg a day.  2. Atenolol 100 mg a day.  3. Darvocet-N 100 as needed for pain.  4. Estraderm.  5. Flonase.  6. Hydrochlorothiazide 25 mg daily.  7. Levsin as needed.  8. Miralax as needed for constipation.  9. Norvasc 10 mg daily. 10. Prilosec  20 mg daily. 11. Vioxx 12.5 mg daily. 12. Zoloft 100 mg daily.  REVIEW OF SYSTEMS:  Review of systems is negative except for that noted in history of present illness.  PHYSICAL EXAMINATION:  VITAL SIGNS:  Weight 192 pounds.  Blood pressure 158/70, pulse 70, respiratory rate 18.  GENERAL:  The patient is an obese black female in no acute distress.  HEENT:  Negative.  NECK:  Neck is supple without thyromegaly.  NODES:  There is no supraclavicular or inguinal adenopathy.  ABDOMEN:  The abdomen is obese, soft and nontender.  No mass, organomegaly, ascites or herniae are noted.  She does have multiple healed scars.  PELVIC:  EG/BUS normal.  Vagina is clean.  Bimanual and rectovaginal exam reveal some nodularity throughout the pelvis and some tenderness.  She also has a large cystocele and on more direct questioning, the patient notes that she does have some stress incontinence.  IMPRESSION:  Menopausal female with a complex relatively small pelvic mass, although associated with pain and free fluid.  We will obtain a CA125 today but I have recommended that she undergo exploratory laparotomy to further define the nature of this mass.  Bilateral salpingo-oophorectomy, omentectomy and surgical staging for ovarian cancer have been outlined if this turns out to be ovarian cancer.  In addition, the patient would like to have a Burch cystourethropexy to improve her problems with incontinence.  All of her questions are answered, we will go ahead and get a CA125 today and schedule surgery for the  near future. DD:  09/02/00 TD:  09/03/00 Job: [id number]

## 2010-10-18 NOTE — Op Note (Signed)
[facility]  Patient:    Brianna, [name]                    MRN: [id number] Proc. Date: 09/22/00 Adm. Date:  [id number] Attending:  Jeannette Corpus CC:         [name], M.D.  Telford Nab, R.N.  Sibyl Parr. Darrick Penna, M.D.  Evette Georges, M.D.  Guadalupe Dawn, M.D.   Operative Report  PREOPERATIVE DIAGNOSES:  Complex pelvic mass with free fluid in the pelvis, rule out ovarian cancer.  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  PROCEDURE:  Exploratory laparotomy, extensive lysis of adhesions, bilateral salpingo-oophorectomy.  SURGEON:  Daniel L. Clarke-Pearson, M.D.  ASSISTANT:  Antionette Char, M.D., Telford Nab, R.N.  ANESTHESIA:  General with oral tracheal tube.  ESTIMATED BLOOD LOSS:  100 cc.  SURGICAL FINDINGS:  At exploratory laparoscopy, the patient had a severe case of adhesions of the omentum, small and large bowel to the anterior abdominal wall, and pelvic peritoneum and tubes and ovaries. This required in excess of 45 minutes of operating time to free these adhesions. The right ovary was densely adherent to the pelvic side wall and was cystic appearing. Frozen section returned showing this to be a serous cystadenoma. The left ovary was normal in size as was the fallopian tube and it was densely adherent to the sigmoid colon mesentery.  DESCRIPTION OF PROCEDURE:  The patient was brought to the operating room and after satisfactory attainment of general anesthesia was placed in the modified lithotomy position in [place] stirrups. The anterior abdominal wall, perineum and vagina were prepped with Betadine, a Foley catheter was placed, and the patient was draped. The abdomen was entered through a midline incision. Peritoneal washings were obtained. Severe adhesions were lysed using sharp and blunt dissection. Total time encompassing  approximately 50 minutes. One area of small bowel serosa was denuded and was oversewn using interrupted sutures of 3-0 Vicryls. Once the pelvis was exposed, the peritoneum of the right pelvic side wall was opened identifying the vessels and ureter. The ovarian vessels were skeletonized, clamped, cut, free tied and suture ligated. The perirectal space was further developed and the ureter mobilized in order to protect it from the dissection. The dissection was carried down to the vaginal cuff which was crossed. At this juncture, the remaining pedicle was cross clamped, divided and suture ligated. The tube and ovary were handed off the operative field and submitted for frozen section with the above noted finding.  A similar procedure was performed on the left side of the pelvis in order to achieve a left salpingo-oophorectomy. This ovary appeared normal and was not submitted to frozen section. Hemostasis was achieved with the cautery and hemoclips.  Attention was turned to the possible placement of a burch cystourethropexy. The retropubic space (space of Retzius) was opened. It was noted that there was extensive and severe venous varicosities throughout the bladder and paravaginal tissues. A vaginal hand was placed in the vagina, elevated in the vagina in order to try to identify a clear space between the bladder and the vagina. There was such severe varicosities in this region that I could not find a clear space in which to place vaginal fascial stitches as part of the birch procedure. Because of this very hazardous situation, it was elected to cancel the planned birch procedure.  The retractors and packs were removed and the anterior abdominal wall was closed  in layers, the first being a running Smead-Jones closure using #1 PDS. The subcutaneous tissue was irrigated, hemostasis achieved with the cautery and the subcutaneous tissue reapproximated with 3-0 Vicryl sutures. The skin was  closed with skin staples and a dressing was applied. The patient was awakened from anesthesia and taken to the recovery room in satisfactory condition. Sponge, needle and instrument counts were correct x 2. DD:  09/22/00 TD:  09/23/00 Job: [id number]

## 2010-10-18 NOTE — Discharge Summary (Signed)
[place]. [facility]  Patient:    [name], [name]                    MRN: [id number] Adm. Date:  [id number] Disc. Date: [id number] Attending:  Doug Sou Dictator:   Ernesto Rutherford, M.D. CC:         Guadalupe Dawn, M.D.   Discharge Summary  DISCHARGE DIAGNOSES: 1. 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 2. Complicated sinusitis. 3. 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 4. Chronic renal insufficiency. 5. Status post ovariectomy one week prior to admission for benign mass. 6. Hypertension. 7. Gastroesophageal reflux disease. 8. Osteoarthritis. 9. History of depression.  DISCHARGE MEDICATIONS:  1. Atenolol 100 mg p.o. q.d.  2. Zoloft 100 mg p.o. q.d.  3. Hydrochlorothiazide 25 mg p.o. q.d.  4. Dilantin 200 mg p.o. b.i.d.  5. Norvasc 10 mg p.o. q.d.  6. Atacand 16 mg p.o. q.d.  7. Prilosec 20 mg p.o. q.d.  8. Estraderm patch, apply one patch every week.  9. Ciprofloxacin 500 mg p.o. b.i.d.  Duration to be determined by ENT at     follow-up visit. 10. Doxycycline 100 mg p.o. b.i.d.  PROCEDURES WHILE IN HOSPITAL: 1. Left endoscopic anterior ethmoidectomy and left endoscopic frontal    sinusotomy performed on Oct 02, 2000. 2. CT of sinus on Oct 01, 2000, with impression of severe opacification of the    left ethmoid air cells and left frontal sinus.  No obvious bone destruction    demonstrated.  Question thinning of the bone, however, at the superior    aspect of the left frontal sinus.  Left frontal cerebritis was seen on    recent MRI scan and this could be the area of extension into the cerebrum. 3. Magnetic resonance imaging of brain performed on November 28, 2000.  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  This is noted in the region of the    fovea  ethmoidalis.  No intra-axial or extra-axial abscess.  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  CONSULTS WHILE IN HOSPITAL: 1. Genene Churn. Love, M.D., neurology. 2. Kristine Garbe. Ezzard Standing, M.D., 3. Jefry H. Pollyann Kennedy, M.D., ENT and surgeon.  FOLLOW-UP:  The patient is to follow up with:  1. Jefry H. Pollyann Kennedy, M.D., two days after discharge.  2. Genene Churn. Love, M.D., of neurology.  3. Her primary care physician, Guadalupe Dawn, M.D.  SPECIAL INSTRUCTIONS ON DISCHARGE:  The patient is to leave the nasal packing in until her follow-up visit with Jefry H. Pollyann Kennedy, M.D.  PRESENTING HISTORY:  Briefly, this is a 70 year old African-American female who presented five days after ovariectomy which was performed at [facility] for a benign mass in her ovary.  She was discharged from [facility] one day prior to presentation to our hospital.  She had a normal hospital course and postoperative course there.  However, she was discharged home and then on the morning of presentation she was noted to have a generalized seizure which was witnessed by the patients daughter.  Secondary to this, she was brought to the [facility] Emergency Room where she was again noted to have a generalized seizure which lasted approximately 45 seconds.  Pertinent medical history is that the patient had had  seizures during an episode of meningitis approximately six years prior to now.  REVIEW OF SYSTEMS:  Essentially negative.  ALLERGIES:  The patient is allergic to CODEINE and PENICILLIN.  PHYSICAL EXAMINATION ON PRESENTATION:  Vital signs were stable.  GENERAL APPEARANCE:  The patient was awake and in no acute distress.  However, she was quite disoriented when she was initially seen.  She knew that she was in the hospital, but did not know which one.  She could not recall any of the names of her children who were present or her husband.  She was, however, able to follow simple  two-step commands.  NEUROLOGIC:  Cranial nerves II-XII grossly intact.  Strength 5/5 throughout. Sensation intact throughout.  Able to perform finger-to-nose coordinated movements with both of her hands.  INITIAL LABORATORY DATA:  The patient did undergo a lumbar puncture while in the emergency department and that revealed CSF with a glucose of 59 and a protein of 32.  Tube #1 had rbcs of 97 and wbcs of 3.  Tube #4 had rbcs of 11 and wbcs of 2.  No organisms seen on the CSF.  The urinalysis was negative.  CBC with white count 3.9, hemoglobin 11.0, and platelets 347. Chem-7 with sodium 140, potassium 3.8, chloride 109, bicarbonate 22, creatinine 1.8, glucose 103, alkaline phosphatase 90, AST 20, ALT 14, albumin 2.8, calcium 9.0, and total protein 7.7.  The chest x-ray showed bibasilar atelectasis.  Could not exclude right basilar pneumonia.  ASSESSMENT AT THE TIME OF ADMISSION:  This is a 70 year old woman with a remote history of seizures.  She was brought in for further evaluation of her seizures.  HOSPITAL COURSE:  The patient was initially seen by Genene Churn. Love, M.D., of neurology, who ordered an MRI and EEG.  The MRI demonstrated the focal cerebritis which was the likely cause of her seizure disorder.  Secondary to this, neurosurgery was called in who felt that given the findings on the MRI that the case would be more appropriate for ENT.  As there did seem to be a bony defect, this needed to be investigated.  Therefore, Jefry H. Pollyann Kennedy, M.D., of ENT was called and he performed endoscopic surgery as described in his separate report.  He performed this surgery on August 02, 2000.  The patient tolerated the procedure well.  Of note, after her initial seizure and being placed on Dilantin, she markedly improved and regained her normal level of mental functioning.  On the day of discharge, the patient was doing quite well and was to have the follow-up as above. DD:  11/26/00 TD:   11/26/00 Job: [id number]

## 2010-10-18 NOTE — Discharge Summary (Signed)
[facility]  Patient:    [name], [name]                    MRN: [id number] Adm. Date:  [id number] Disc. Date: [id number] Attending:  Sandi Raveling                           Discharge Summary  Following admission, the patient was taken to the operating room where she underwent exploratory laparotomy, extensive lysis of adhesions, and bilateral salpingo-oophorectomy for a complex pelvic mass.  Final pathology showed this to be a benign serous cystadenoma of the right ovary.  There were fibrous adhesions around the left tube and ovary but no neoplastic process.  Patient had 100 cc blood loss.  Postoperatively, she had an uncomplicated postoperative course, ambulated on the first postoperative day, and was rapidly advanced in her diet with good return of bowel function.  DISCHARGE DIAGNOSES: 1. Serous cystadenoma of the ovary. 2. Hypertension. 3. Gastroesophageal reflux disease. 4. Irritable bowel syndrome. 5. Chronic renal failure. 6. Osteoarthritis.  DISCHARGE INSTRUCTIONS:  The patient was given a prescription for Percocet to be taken one every 4 hours p.r.n. pain.  She will gradually increase her activity, eat a regular diet, and use a mild laxative that she used to for constipation. She will return in one week to have her staples removed and to see Dr. Antionette Char for a six-week postoperative checkup.  CONDITION AT DISCHARGE:  Improved. DD:  09/30/00 TD:  10/01/00 Job: [id number]

## 2010-10-18 NOTE — Consult Note (Signed)
[place]. [facility]  Patient:    [name], [name]                    MRN: [id number] Proc. Date: 10/01/00 Adm. Date:  [id number] Disc. Date: [id number] Attending:  Sandi Raveling                          Consultation Report  REFERRING PHYSICIAN:  Medicine teaching service.  REASON FOR CONSULTATION:  Sinusitis and cerebritis.  HISTORY OF PRESENT ILLNESS:  This is a 70 year old lady who was admitted to the hospital over the past weekend with an acute onset of seizure activity. She was evaluated with CT scan of the head and an MRI scan, which revealed left frontal cerebritis secondary to left frontal sinusitis.  She was transferred to [facility] from [facility] and was started on intravenous antibiotics.  The lumbar puncture was basically negative for any organisms. She has a history of meningitis about 10 years ago, also related to sinus disease.  She underwent ethmoidectomy by Dr. Narda Bonds around that time. There was a CT of the sinuses done in 1997, which I have reviewed.  It reveals what appears to be possibly a mucocele in the left frontoethmoidal area.  She had not had a full CT of the sinuses during this admission.  She is responding well to Dilantin and antibiotics.  The patient has requested a second opinion for reasons that are not completely clear to me.  PAST MEDICAL HISTORY: 1. Sinus disease. 2. Sinus surgery. 3. Meningitis. 4. Seizures in the past. 5. Hypertension. 6. Reflux. 7. Osteoarthritis. 8. Chronic renal insufficiency. 9. She underwent resection of a benign ovarian mass at [facility]    about 1-1/2 weeks ago, which was apparently uncomplicated.  ADMISSION MEDICATIONS:  Norvasc, atenolol, Atacand, hydrochlorothiazide, and Lipitor.  PHYSICAL EXAMINATION:  GENERAL:  Pleasant, healthy-appearing, elderly lady in no distress.  According to her son, she was having some difficulty with memory and with her  attention but has improved and, on cursory exam today, appears to have a fairly normal mental status.  HEENT:  Normal to inspection.  Slight exophthalmos bilaterally, but no proptosis, edema, or difficulty with visual gaze.  There is no diplopia or visual change.  There is slight tenderness over the frontal bone bilaterally. The nasal exam is clear.  Remainder of the head and neck examination is unremarkable.  There are no current signs of meningismus.  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 Recommend urgent evaluation with a full CT scan of the sinuses to see if there is any bony defect.  Will definitely require surgical intervention but, based on her clinical situation and the CT scan, will decide if this needs to be done urgently while in the hospital or can be performed later on as an outpatient.  I will also discuss this with Dr. Sandria Manly, who is the attending neurologist, to see his impression of her neurologic status overall.  DD: 10/01/00 TD:  10/03/00 Job: [id number]

## 2010-10-18 NOTE — Op Note (Signed)
NAME:  [name], [name]                ACCOUNT NO.:  [id number]   MEDICAL RECORD NO.:  [id number]          PATIENT TYPE:  AMB   LOCATION:  ENDO                         FACILITY:  [facility]   PHYSICIAN:  James L. Malon Kindle., M.D.DATE OF BIRTH:  16-Apr-1941   DATE OF PROCEDURE:  01/22/2006  DATE OF DISCHARGE:                                 OPERATIVE REPORT   PROCEDURE:  Colonoscopy.   MEDICATIONS:  The patient received a total of fentanyl 100 mcg, Versed 7 mg  for both procedures.   INDICATIONS:  Colon cancer screening.   DESCRIPTION OF PROCEDURE:  Procedure explained to the patient.  Consent  obtained following the endoscopy.  She was turned around.  The Olympus  adjustable pediatric colonoscope was inserted and advanced.  Prep was poor.  The patient had extensive diverticular disease really throughout the entire  colon.  There were large balls of hard stool and retained stool throughout.  Some areas were clean, others were not.  In order to advance through the  left colon, we ended up placing the patient on her back and were able to  advance on over to the cecum, ileocecal valve, and appendiceal orifice seen.  Scope withdrawn.  The colon carefully examined.  She had pan-diverticular  disease with diverticula over in the right colon, but particularly prominent  in the left colon, not particularly the sigmoid.  Multiple large and small  mouth diverticula.  No polyps were seen throughout the entire colon,  although a small polyp could have been missed due to the poor prep.  The  rectum was free of polyps as well.  The scope was withdrawn.  The patient  tolerated the procedure well.   ASSESSMENT:  Pan-diverticulosis, otherwise normal screening colonoscopy.   PLAN:  Routine followup.  Would resume Hemoccults in five years.  Consider  another colonoscopy in 10 years.           ______________________________  Llana Aliment Malon Kindle., M.D.     Waldron Session  D:  01/22/2006  T:  01/23/2006  Job:   [id number]   cc:   Sibyl Parr. Darrick Penna, M.D.

## 2010-10-18 NOTE — Discharge Summary (Signed)
[place]. [facility]  Patient:    [name], [name]                       MRN: [id number] Adm. Date:  [id number] Disc. Date: [id number] Attending:  Tobin Chad Dictator:   Cheree Ditto, M.D.                           Discharge Summary  DISCHARGE DIAGNOSES: 1. Chest pain, myocardial infarction ruled out. 2. Hypertension. 3. Gastroesophageal reflux disease. 4. Irritable bowel syndrome. 5. History of focal segmental glomerulosclerosis. 6. Osteoarthritis. 7. Chronic low back pain. 8. Depression.  DISCHARGE MEDICATIONS: 1. Atacand 16 mg q.d. 2. Atenolol 100 mg q.d. 3. Hydrochlorothiazide 25 mg q.d. 4. Zoloft 50 mg q.d. 5. Norvasc 10 mg q.d. 6. Prilosec 20 mg q.d. 7. Ultram 50 mg p.o. q.4-6h. p.r.n. pain. 8. Darvocet one p.o. q.4-6h. p.r.n. pain. 9. Enteric-coated aspirin one p.o. q.d.  HISTORY AND PHYSICAL:  See complete dictated History and Physical from May 28, 2000.  In short, Ms. Goodnough is a 70 year old female with hypertension and a family history of MI who presented to the clinic with chest pain.  She was admitted for evaluation.  HOSPITAL COURSE: #1 - CHEST PAIN:  Patient had a normal EKG in clinic.  She had normal serial enzymes, CK-MB and troponin I, x 3.  Her EKG on the morning of discharge was normal.  Her chest heaviness resolved during hospitalization.  It was felt that her chest pain was noncardiac in nature.  Given her hypertension and family history of MI, we did start her on a low dose of an enteric-coated aspirin prior to discharge.  #2 - HYPERTENSION:  Patient had been getting diastolic readings in the 120s at home, but her blood pressure was normal throughout hospitalization.  Continue ARB, beta blocker, calcium channel blocker, and diuretic at home.  I asked her to bring in her blood pressure monitor when she follows up with Dr. Roseanne Reno to make sure that the readings coincide with our blood pressure readings  in clinic.  #3 - RENAL:  Patient has a history of focal segmental glomerulosclerosis.  Her creatinine during hospitalization was normal.  #4 - GASTROESOPHAGEAL REFLUX DISEASE:  Patient was continued on her home regimen of Prilosec during hospitalization and was discharged without any changes being made.  #5 - DEPRESSION:  Zoloft was continued during hospitalization and upon discharge.  DISPOSITION AND FOLLOW-UP:  Patient was instructed to call [facility] for an appointment with Dr. Roseanne Reno within two weeks of hospital discharge. DD:  05/29/00 TD:  05/29/00 Job: [id number]

## 2010-10-18 NOTE — Op Note (Signed)
NAME:  [name], [name]                ACCOUNT NO.:  [id number]   MEDICAL RECORD NO.:  [id number]          PATIENT TYPE:  AMB   LOCATION:  ENDO                         FACILITY:  [facility]   PHYSICIAN:  James L. Malon Kindle., M.D.DATE OF BIRTH:  Jan 10, 1941   DATE OF PROCEDURE:  01/22/2006  DATE OF DISCHARGE:                                 OPERATIVE REPORT   PROCEDURE:  Esophagogastroduodenoscopy.   MEDICATIONS:  Fentanyl 75 mcg, Versed 6 mg IV, Cetacaine Spray.   INDICATION:  Esophageal reflux symptoms.   DESCRIPTION OF PROCEDURE:  Procedure explained to the patient.  Consent  obtained.  In the left lateral decubitus position, the endoscope was  inserted easily into the esophagus and advanced under direct visualization.  The stomach was entered, ports identified, and passed.  Duodenum including  the bulb and second portion seen well and unremarkable.  Pyloric channel and  the antrum were normal.  Fundus and cardia seen well under retroflex view.  The diaphragm was located approximately 38 cm.  There was a 7-cm hiatal  hernia with a widely patent GE junction with free reflux.  The distal and  proximal esophagus were endoscopically normal with no signs of Barrett  esophagus identified.  The scope was withdrawn.  The patient tolerated the  procedure well.   ASSESSMENT:  Hiatal hernia with reflux.   PLAN:  We will give her a reflux instruction sheet.  Start her on Prilosec  over-the-counter daily.  Proceed with colony at this time as planned.           ______________________________  Llana Aliment. Malon Kindle., M.D.     Waldron Session  D:  01/22/2006  T:  01/23/2006  Job:  [id number]   cc:   Sibyl Parr. Darrick Penna, M.D.

## 2010-10-18 NOTE — H&P (Signed)
[place]. [facility]  Patient:    [name], [name]                       MRN: [id number] Adm. Date:  [id number] Disc. Date: [id number] Attending:  Sandi Raveling Dictator:   Cheree Ditto, M.D. CC:         Guadalupe Dawn, M.D.   History and Physical  CHIEF COMPLAINT:  Chest tightness.  HISTORY OF PRESENT ILLNESS:  This 70 year old African-American female with hypertension presents with substernal chest tightness since May 26, 2000, in the evening.  She describes tightness and a heaviness.  Her pain is 7/10 at its worst.  She has shortness of breath associated with the pain and some diaphoresis.  No nausea or vomiting.  She also has a pain in her left shoulder.  She is very worried about her blood pressure readings at home, which have been as high as 120 diastolic.  Her chest pain is worse with exertion.  She had a negative cardiac catheterization by Dr. Francisca December in 1998.  PAST MEDICAL HISTORY:  1. Hypertension.  2. Gastroesophageal reflux disease.  3. Irritable bowel syndrome.  4. Focal segmental glomerular sclerosis.  5. History of alopecia.  6. Osteoarthritis.  7. History of disk problems and chronic low back pain.  8. Proteinuria.  9. Depression. 10. Constipation.  CURRENT MEDICATIONS: 1. Atacand 16 mg q.d. 2. Atenolol 100 mg q.d. 3. Hydrochlorothiazide 25 mg q.d. 4. Norvasc 10 mg q.d. 5. Prilosec 20 mg q.d. 6. Ultram 50 mg q.4-6h. p.r.n. pain. 7. Darvocet p.r.n.  ALLERGIES:  No known drug allergies.  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  She currently smokes.   No alcohol or drugs.  FAMILY HISTORY:  Father died of a myocardial infarction when he was age 11.  PAST SURGICAL HISTORY: 1. Appendectomy. 2. Tonsillectomy. 3. Hysterectomy. 4. Cholecystectomy.  REVIEW OF SYSTEMS:  Positive for one week of fatigue, as well as a headache and as above.  Otherwise  unremarkable.  PHYSICAL EXAMINATION:  VITAL SIGNS:  Blood pressure 170/88, O2 saturation 100%, heart rate 66.  GENERAL:  She is tired-appearing, but in no acute distress.  HEENT:  Pupils equal, round, reactive to light and accommodation.  Extraocular movements intact.  Oropharynx clear.  NECK:  Supple, without lymphadenopathy or thyromegaly.  HEART:  A regular rate and rhythm, with no murmur.  LUNGS:  Clear to auscultation bilaterally.  ABDOMEN:  Obese, soft, with well-healed surgical scars, nontender.  No organomegaly, no mass.  EXTREMITIES:  No cyanosis, clubbing, or edema.  NEUROLOGIC:  Cranial nerves II-XII intact.  Electrocardiogram:  Normal sinus rhythm.  No acute ST-T wave changes.  ASSESSMENT/PLAN:  A 70 year old with hypertension, tobacco abuse, and a family history of a myocardial infarction, who presents with atypical chest pain.  PLAN: 1. Chest pain:  Will admit for a 23-hour observation and rule out a    myocardial infarction.  Continue her beta blocker.  Will start on    aspirin.  She had a negative cardiac catheterization approximately    three years ago.  Will continue her proton pump inhibitor. 2. Hypertension:  Will continue her home medications.  Will follow her    blood pressures. 3. Gastroesophageal reflux disease:  Will continue her proton pump    inhibitor. 4. Headache with normal neurologic examination:  Will provide analgesia    with Darvocet.  DD:  05/28/00 TD:  05/28/00 Job: [id number]

## 2010-10-18 NOTE — Consult Note (Signed)
[place]. [facility]  Patient:    [name], [name]                    MRN: [id number] Proc. Date: 10/11/00 Adm. Date:  [id number] Attending:  Doug Sou CC:         Wayne A. Sheffield Slider, M.D.  [facility], [address]   Consultation Report  HISTORY OF PRESENT ILLNESS:  [name] is a 70 year old, right-handed, black female, born 29-Mar-1941, with a history of sinusitis with associated cerebritis and a seizure event that occurred in early May 2002.  This patient was seen by Dr. Melbourne Abts at that time.  MRI scan of the brain was performed showing evidence of an opacification of the left ethmoid air cells and left frontal sinus.  The MRI scan suggested left frontal cerebritis and focal meningitis in this region.  The patient has been treated with antibiotics and currently is on Cipro.  The patient has been treated with Dilantin for the seizures.  The patient also had a history of some seizures on and off through the years and has had a prior history of meningitis as a child.  The patient has been out of the hospital only about a week or so, returns at this point complaining of feeling twitchy or jittery in the legs.  The patient has had increased difficulty with walking.  She claims that when she stands or sits, she gets dizzy.  She claims that her legs feel restless, twitchy.  The patient has not had another seizure.  The patient has been on a combination of Diflucan and Dilantin together over the couple of days but now is off the Diflucan.  Dilantin level today is 17.9.  CT scan of the head is pending today.  The patient was found to have a systolic blood pressure of 97.  BUN and creatinine were elevated compared to that seen in the hospital a week ago. BUN and creatinine at this time is 42/1.8; in the hospital, it was 23/1.5. Neurology is called for an evaluation.  PAST MEDICAL HISTORY: 1. History of restless leg  syndrome. 2. History of seizures. 3. History of cerebritis and sinusitis. 4. History of meningitis at age 66. 5. History of chronic renal insufficiency. 6. History of degenerative arthritis. 7. History of hypertension. 8. History of gastroesophageal reflux disease. 9. History of dehydration today.  MEDICATIONS: 1. Dilantin 200 mg b.i.d. 2. Zoloft 100 mg a day. 3. Flonase nasal spray. 4. Hydrochlorothiazide 25 mg daily. 5. Cipro 500 mg b.i.d. 6. Atenolol 100 mg daily. 7. Lipitor 40 mg a day. 8. Norvasc 10 mg daily.  ALLERGIES:  PENICILLIN, CODEINE, SULFA DRUGS.  HABITS:  The patient smokes one and one-half packs of cigarettes daily, does not drink alcohol.  SOCIAL HISTORY:  This patient lives in the [place], [place], area, is retired.   The patient lives with a niece and nephew.  FAMILY MEDICAL HISTORY:  At this time was not obtained.  REVIEW OF SYSTEMS:  Notable for no fevers or chills.  The patient has ongoing headaches and has had for several months.  The patient denies any blurred vision, loss of vision.  She does not occasional chest pain.  She denies any shortness of breath, feels somewhat numb in the legs.  She denies any problems controlling the bowels or the bladder, denies any actual blackout episodes.  PHYSICAL EXAMINATION:  VITAL SIGNS:  Blood pressure 97/51, heart rate 62,  respiratory rate 12, temperature afebrile.  GENERAL:  The patient is a minimally obese black female who is alert and cooperative at the time of examination.  HEENT:  Head is atraumatic.  Eyes: Pupils are equal, round and reactive to light.  Disks are flat bilaterally.  NECK:  Supple.  No carotid bruits noted.  RESPIRATORY:  Clear.  CARDIOVASCULAR:  Regular rate and rhythm without obvious murmurs or rubs noted.  EXTREMITIES:  Without significant edema.  NEUROLOGIC:  Cranial nerves as above.  Facial symmetry is present. The patient notes slight decrease in pinprick  sensation in the left face compared to the right.  The patient has full visual fields.  Speech is well enunciated, no aphasia.  Motor testing reveals 5/5 strength in all fours.  Good symmetric motor tone is noted throughout.  Sensory testing again reveals some decreased pinprick sensation.  Vibratory sensation in left arm and left leg greater than on the right.  This is very minimal in nature.  The patient has good strength in all fours.  The patient has good symmetric reflexes.  Toes are neutral bilaterally.  The patient has good finger-nose-finger, toe-to-finger bilaterally.  The patient was not ambulated.  No pronator drift is seen.  LABORATORY VALUES:  Notable for white count of 5.9, hemoglobin 11.4, hematocrit 34.4, MCV 78.4, platelets 396.  Sodium 137, potassium 4.1, chloride 110, CO2 23, glucose 98, BUN 42, creatinine 1.8, calcium 8.8, total protein 7.2, albumin 2.8, AST 17, ALT 17, alkaline phosphatase 129, total bilirubin 0.5.  Dilantin level is 17.9.  IMPRESSION: 1. History of sinusitis with cerebritis. 2. Seizure events. 3. Probable dehydration. 4. Restless leg syndrome.  This patient appears to have had an elevation of BUN and creatinine since her recent admission.  The patient has had some nausea, has had decreased p.o. intake.  A cutback, at least temporarily, of some of her blood pressure medications, give her IV fluid hydration today.  If CT scan of the head looks unremarkable today, will allow the patient to return home.  Plan to cut the Norvasc down to 5 mg a day instead of 10 and may eliminate the hydrochlorothiazide for at least a week prior to reinstituting this.  The patient will maintain a Dilantin dosing of 200 mg twice a day.  The patient is to try to avoid Diflucan while on Dilantin in the future.  Thank you very much. DD:  10/11/00 TD:  10/12/00 Job: [id number]

## 2010-10-18 NOTE — Discharge Summary (Signed)
[place]. [facility]  Patient:    [name], [name]                     MRN: [id number] Adm. Date:  [id number] Disc. Date: [id number] Attending:  Jonne Ply                           Discharge Summary  ADMISSION DIAGNOSES: 1. 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 2. Seizure disorder.  DISCHARGE DIAGNOSES: 1. 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 2. Seizure disorder. 3. Cardiac arrhythmia with sinus bradycardia.  MAJOR OPERATIONS:  Left bifrontal craniotomy, repair of cerebrospinal fluid leak, and resection of left ethmoid encephalocele.  HISTORY OF PRESENT ILLNESS:  [name] is a 70 year old individual who has had problems with seizure on three separate events felt to be related to cerebrospinal fluid leak.  She had had ethmoid sinusitis and underwent a sinus surgery in April of this year.  At the time she had some cerebrospinal fluid leakage, she subsequently developed a seizure.  She has been on Depakote for control of her seizures.  Since the time of her surgery, she has had occasional CSF rhinorrhea, but this has been her third episode of seizure and CSF leak.  CT scan demonstrated the presence of a hole in the floor of the frontal fossa overlying the ethmoid sinus on the left side.  The patient also has had no difficulties with abnormal sensations of smell.  The patient was advised regarding surgical repair of the encephalocele.  HOSPITAL COURSE:  She was taken to the operating room on December 24, 2000, and underwent bifrontal craniotomy with repair of the encephalocele and closure of the CSF leak.  Postoperatively the patient did well.  However, during her postoperative stay, it was noted that she had episodes of sinus bradycardia with a heart rate of 30 at times and sinus pauses lasting up to three seconds. She was seen by Nathen May, M.D., F.A.C.C., from  cardiology and his evaluation suggested that these were benign events.  He changed some of her heart medications and decreased some beta blockers.  However, the symptoms persisted.  The patient was observed in the hospital on a cardiac monitor for a period of a week.  She would have these episodes once or twice daily, but on certain days she would have more than 80-100 episodes of sinus bradycardia. It was felt that the condition was benign and that she would undergo long-term monitoring for some period of time before any decision regarding placement of a pacemaker was made.  At this time, the patient is discharged home.  Her incision on her scalp is clean and dry.  She has not had any CSF leakage. Since the time of surgery, the patient reports no sense of any kind of smells. The left olfactory bulb was sacrificed at time of surgery.  The right olfactory bulb remained intact.  Nonetheless, the patient will be seen in the office in three weeks time for further follow-up.  She remains on Depakote and her cardiac medications. DD:  01/01/01 TD:  01/03/01 Job: [id number]

## 2010-10-18 NOTE — H&P (Signed)
[place]. [facility]  Patient:    [name], [name]                    MRN: [id number] Adm. Date:  [id number] Disc. 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  HISTORY OF PRESENT ILLNESS:  The patient is a 70 year old individual who has had significant episodes of seizures on three separate occasions.  These apparently have been associated with cases of sinusitis.  A few months ago she underwent an ethmoidectomy because of chronic severe sinusitis and postoperatively the patient has a seizure disorder.  Biopsy specimens from the ethmoidectomy demonstrated cerebral tissue.  CT scan subsequently demonstrated that there was an inflamed base of the frontal lobe, and further workup disclosed that there was a small encephalocele into the ethmoid sinus. Because the patient has had several previous episodes of infection and has a seizure disorder as a result of this process, she has been advised regarding surgery.  Patient also has noted that since the last episode she has a hyperacute sense of smell, particularly for very foul-smelling substances and this has been a persistent problem for her.  PAST MEDICAL HISTORY:  Reveals that she has hypertension.  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  CURRENT MEDICATIONS:  Include atenolol, Norvasc, Atacand, and hydrochlorothiazide for hypertension.  She has been on Cipro and doxycycline for a sinus infection.  She had been on Dilantin but has been started on Depakote 1500 mg a day.  She takes Prilosec 20 mg for her stomach.  She uses oxycodone as needed for pain and p.r.n. medications for her heart including nitroglycerin, and Lipitor for cholesterol.  FAMILY HISTORY:  Reveals that her mother died at age 26.   Father died at age 59.  Mother had thyroid cancer, father had hardening of the arteries.  She has a sister with hypertension and cancer.  SYSTEMS REVIEW:  Notable for weight loss, night sweats, wearing of glasses, balance disturbance, sinus problems, abdominal pain, change in bowel habits, gastritis, nausea, vomiting, incontinence of urine, mild depression, inability to concentrate, occasional disorientation with memory problems - on a 14-point review sheet.  PHYSICAL EXAMINATION:  VITAL SIGNS:  Blood pressure 140/88, heart rate 66 and regular, respirations 18.  NEUROLOGIC:  She is alert and oriented.  She has difficulty with maintaining competent train of thought and will tend to wander from subject to subject very easily.  Her serial 7s are intact only to four digits.  She can remember only 1/3 words at three minutes.  Cranial nerve examination reveals her pupils are 4 mm, briskly reactive to light and accommodation.  The extraocular movements are full.  Face is symmetric to grimace.  Tongue and uvula are in the midline.  Sclerae and conjunctivae are clear.  Fundi reveal the disks are flat bilaterally.  Facial sensation is intact.  The neck has no masses and no bruits are heard.  Her station and gait are such that she has very slow movements and walks with a cane.  She can walk independently but cannot tandem walk.  Her station is normal.  Romberg is negative.  Deep tendon reflexes are 2+ in the biceps, 1+ in the triceps,  1+ in the patellae and the Achilles. Babinskis are upgoing bilaterally.  LUNGS:  Clear to auscultation.  HEART:  Regular rate and rhythm.  ABDOMEN:  Soft.  Bowel sounds are positive.  No masses are palpable.  EXTREMITIES:  No cyanosis, clubbing, or edema.  IMPRESSION:  The patient has evidence of severe ethmoid sinusitis on several occasions and she has a hole from her ethmoid sinus into her intracranial space.  She has had spinal fluid leak in the past but  currently is not leaking spinal fluid.  She has been advised regarding surgical repair of this process and is now admitted for that procedure. DD:  12/24/00 TD:  12/25/00 Job: [id number]

## 2010-10-18 NOTE — Consult Note (Signed)
[name]. [facility]  Patient:    [name], [name]                    MRN: [id number] Adm. Date:  [id number] Disc. Date: [id number] Attending:  Sandi Raveling CC:         Wayne A. Sheffield Slider, M.D.   Consultation Report  DATE OF BIRTH:  30-Sep-1940  INDICATIONS:  This 70 year old right-handed black separated female from [place], [place] is seen for evaluation of suspected seizures.  HISTORY OF PRESENT ILLNESS:  Approximately 12 years ago, this patient was admitted to the hospital by Dr. Doristine Mango and at that time had a meningitis.  It is not clear whether this was viral or bacterial in etiology.  At that time, she was thought to have had seizures.  Subsequently, three years ago she had an episode which may have been a seizure, according to her daughter.  She came out of bed, was in a tremor, and her head hit the floor.  She was not placed on any medications/procedures at that time.  She has been seen by Dr. Shireen Quan at [facility] with episodes of blank stares.  These have been evaluated with EEG in 1998, which was unremarkable. She is currently not on anticonvulsants.  She was in her usual state of health, until Sunday afternoon.  At that time, her daughter was in another room and heard the dog barking and went in and found the patient having tonic and clonic activity lasting approximately 3-4 minutes.  The patient was very confused afterwards and was taken to the emergency room at [facility], where a second episode was observed lasting 2-3 minutes.  CAT scan of the brain without contrast enhancement was normal.  An MRI study of the brain showed a questionable left frontal lesion and also ethmoid sinusitis. Spinal tap was performed showing initially tube #1 with 97 red blood cells and 3 white blood cells, tube #4 with 11 red blood cells and 2 white blood cells. The glucose was 59 and protein was 32.  White blood cell  count was 7400, hemoglobin was 11.0, platelets were 347,000.  Glucose was 103, sodium 140, potassium 3.8, chloride 109, CO2 content 22, BUN 22, creatinine 1.9 which was elevated.  Liver function tests were normal.  She had a chest x-ray which showed bibasilar atelectasis.  She was not placed on anticonvulsants at that time.  She was transferred to [facility].  MEDICATIONS PRIOR TO ADMISSION: 1. Norvasc. 2. Atenolol. 3. Atacand. 4. Hydrochlorothiazide. 5. Lipitor. 6. Miralax. 7. Darvocet-N 100.  MEDICATIONS IN HOSPITAL: 1. Demerol 50 mg q.4h. p.r.n. pain. 2. Tylenol 500 mg q.4h. p.r.n. pain. 3. Atenolol 100 mg p.o. q.d. 4. Protonix 40 mg p.o. q.h.s. 5. Vioxx 12.5 mg p.o. q.d. 6. Zoloft 100 mg p.o. q.d. 7. Hydrochlorothiazide 25 mg p.o. q.d. 8. Sorbitol 30 cc q.d.  PHYSICAL EXAMINATION:  GENERAL:  A well-developed, pleasant black female who had very little remembrance of events from the day prior to this visit.  She says "I dont know" when talked about seizures that may have occurred in the past.  She does not known her physician.  She does not remember seeing Dr. Noreene Filbert in the past.  VITAL SIGNS:  Blood pressure in right and left arm 160/80.  There was a left carotid, left supraclavicular, and right supraclavicular bruit heard.  Neck flexion-extension maneuvers were unremarkable.  She was afebrile.  Heart rate was  64 and regular.  NEUROLOGIC:  Mental status:  She was alert and oriented x 3.  She did follow one-, two-, and three-step commands.  Her cranial nerve examination revealed visual fields to be full.  Disks were flat with spontaneous venous pulsation seen.  The extraocular movements were full and corneals were present.  Facial sensation was equal.  There was no facial motor asymmetry.  Hearing was present with air conduction greater than bone conduction.  Tongue was midline. The uvula was midline.  Gags were present.  Sternocleidomastoid and  trapezius testing were normal.  Motor examination revealed 5/5 strength proximally and distally in the upper and lower extremities without any evidence of proximal pronator or distal drift.  Coordination testing revealed finger-to-nose and heel-to-shin to be well done.  Sensory examination was intact to pinprick, ______, and vibration testing.  Deep tendon reflexes were 2+ and plantar responses were downgoing.  IMPRESSION: 1. History of seizure, code 345.10. 2. Abnormal MRI left frontal region.  PLAN:  The plan at this time is to repeat her MRI with contrast, keep her on seizure precautions, and review her EEG. DD:  09/28/00 TD:  09/29/00 Job: [id number]

## 2010-10-18 NOTE — Op Note (Signed)
[place]. [facility]  Patient:    [name], [name]                       MRN: [id number] Proc. Date: 12/24/00 Adm. Date:  [id number] Attending:  Jonne Ply                           Operative Report  PREOPERATIVE DIAGNOSIS:  Cerebrospinal fluid leak due to ethmoid sinusitis with encephalocele.  POSTOPERATIVE DIAGNOSIS:  Cerebrospinal fluid leak due to ethmoid sinusitis with encephalocele.  OPERATION PERFORMED:  Bifrontal craniotomy repair of cerebrospinal fluid leak, resection of olfactory bulb on the left.  SURGEON:  Stefani Dama, M.D.  ASSISTANT:  Tanya Nones. Jeral Fruit, M.D.  ANESTHESIA:  General endotracheal.  INDICATIONS FOR PROCEDURE:  The patient is a 70 year old individual who has had significant episodes of seizure secondary to sinusitis with a CSF leak on the left side and a small encephalocele into the ethmoid sinus.  The patient has been advised regarding surgical repair of this process and is now taken to the operating room.  DESCRIPTION OF PROCEDURE:  The patient was brought to the operating room supine on a stretcher.  After smooth induction of general endotracheal anesthesia, her head was shaved, placed on a horseshoe headrest and the frontal region was prepared.  A bicoronal incision was created and carried down to the galea.  The scalp was flapped forward and the pericranium was stripped separately.  The patient then had a frontal craniotomy performed by placing a single bur hole near the vertex off to the left side and placing two separate small bur holes at the key hole.  Then using a Hall drill craniotome, a craniotomy flap was raised.  This was required because the patients skull was excessively thick, such that the Anspach drill did not provide enough clearance for the craniotomy.  The bone flap was raised in a single piece and it was noted that the dura was frayed over the left frontal lobe with much of it attached to a  hyperostotic thickened frontal bone.  This was laid aside and then after controlling some bleeding from some cortical veins, the dura was opened the rest of the way exposing the right frontal lobe and the midline falx.  The anterior portion of the sinus was then tied off with two 2-0 silk sutures and it was divided.  This allowed for flapping forward of the dura. Because the dura was so severely shredded on the right side, the dura was noted to be opened right against the frontal portion of the craniotomy opening.  Then the brain was elevated gently, retracting the frontal lobe superiorly and with further exploration posteriorly, an opening was noted into the ethmoid sinus.  Brain was in this area.  This brain was amputated easily and the opening was noted to measure 8 mm around, had smooth borders.  Tissue from this area was resected and the encephalocele was removed from within the sinus.  No gross tissue was noted within the sinus itself.  The sinus edges were stripped clear and then it was felt that a closure would need to be performed over this area.  On the medial aspect the olfactory bulb was identified deep in the groove over the crista galli.  The olfactory bulb itself was noted to be quite severely scarred and dysplastic and was removed with suction such that no biopsy was obtained.  The  crista galli itself was noted to be intact.  The procedure was then continued by using the pericranial flap and cutting a piece to fold over the edge of the craniotomy and to be sewn down with a pedicle attached to the base of the scalp.  This piece was then secured with interrupted 4-0 Nurolon sutures.  Once this was secured, it was felt that further sealing would be performed with Tisseal tissue sealant. This was prepared and then Tisseal was placed over the crista galli and around the closure that had been performed.  Once this was accomplished, the brain retractor was removed.  The frontal lobe  was allowed to reposition itself. Bleeding from some cortical veins was controlled with a bipolar cautery.  It was noted that several veins were attached to the anterior portion of the falx and these had been divided.  The falx was then replaced and the sinus was reapproximated and the dura on the right side of the brain was closed as tightly as possible.  A separate pericranial flap was then used to close the rest of the dura that had been stripped on the left side.  This was attached to the perimetry with interrupted 4-0 Nurolon sutures and a good dural closure was obtained.  The bone flap was then replaced and secured with Osteomed implants to secure the craniotomy flap.  Care was taken to make sure that the pericranial pedicle graft was not compressed.  With this then, the galea was closed with 2-0 Vicryl in interrupted fashion and surgical staples were used in the scalp.  A large Jackson-Pratt drain was placed in the subgaleal space and this was connected to a bulb suction.  The patient tolerated the procedure well and was returned to recovery room in stable condition. DD:  12/24/00 TD:  12/25/00 Job: [id number]

## 2010-10-18 NOTE — Cardiovascular Report (Signed)
[place]. [facility]  Patient:    [name], [name] Visit Number: [id number] MRN: [id number]          Service Type: CAT Location: [facility] 2856 [id number] Attending Physician:  Ricki Rodriguez Dictated by:   Ricki Rodriguez, M.D. Proc. Date: 11/22/01 Admit Date:  11/26/2001 Discharge Date: 11/26/2001   CC:         Cardiac Catheterization Lab  Bradly Bienenstock, M.D., [facility]   Cardiac Catheterization  PROCEDURE DONE BY:  Ricki Rodriguez, M.D.  PROCEDURES:  Left heart catheterization and selective coronary angiography.  INDICATIONS:  This 70 year old black female with a history of hypertension had recurrent angina.  APPROACH:  Right femoral artery using #6 French diagnostic catheters.  A No-Torque right Judkins catheter was required for right coronary angiography.  COMPLICATIONS:  None.  PROCEDURAL NOTE:  Perclose suture applied at the end of the procedure successfully.  HEMODYNAMIC DATA: 1. The left ventricular pressure was 181/13. 2. Aortic pressure was 187/90.  LEFT VENTRICULOGRAM:  A left ventriculogram was not done to conserve the dye and less than 40 cc of the dye was used for the procedure.  CORONARY ANATOMY: 1. The left main coronary artery was unremarkable.  2. Left anterior descending coronary artery:  The left anterior descending    coronary artery had luminal irregularities and very narrow distal half    of the vessel and a diagonal vessel was also a smaller caliber vessel.  3. Left circumflex coronary artery:  The left circumflex coronary artery also    showed luminal irregularities in the proximal half of the vessel.  The    ramus branch was unremarkable.  The obtuse marginal branch #1 and #3 were    very small vessels and obtuse marginal branch #2 and #4 were small vessels.  4. Right coronary artery:  The right coronary artery had luminal    irregularities in the proximal half of the vessel and was a dominant     vessel.  IMPRESSION: 1. Minimal multivessel native vessel coronary artery disease. 2. Hypertensive heart disease.  RECOMMENDATIONS:  This patient will be treated medically.  She was given Vasotec IV 2.5 mg x2 and Lopressor 2.5 mg x1 and one sublingual nitroglycerin spray to control the blood pressure. Dictated by:   Ricki Rodriguez, M.D. Attending Physician:  Ricki Rodriguez DD:  11/26/01 TD:  11/27/01 Job: [id number]

## 2010-10-18 NOTE — Op Note (Signed)
[place]. [facility]  Patient:    [name], [name]                    MRN: [id number] Proc. Date: 10/02/00 Adm. Date:  [id number] Disc. 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  PROCEDURES: 1. Left endoscopic anterior ethmoidectomy. 2. Left endoscopic frontal sinusotomy.  ANESTHESIA:  General endotracheal was used.  COMPLICATIONS:  No complications.  SURGEON:  Jefry H. Pollyann Kennedy, M.D.  ESTIMATED BLOOD LOSS:  20 cc.  FINDINGS:  Anterior ethmoid mucocele with thick mucoid material found within and polypoid tissue in the anterior ethmoid cavity just anterior and superior to the mucocele.  Defect of the fovea ethmoidalis anterior to the mucocele as well.  Obstructed frontal sinus with thick, inspissated mucus.  There was no pus or active infection identified.  There was no CSF leak identified.  The patient tolerated the procedure well and was awakened, extubated, and transferred to recovery in stable condition.  INDICATION FOR PROCEDURE:  This is a 70 year old lady who was admitted over the weekend with acute onset of seizures, found to have frontal lobe cerebritis.  She was also found to have ethmoid and frontal sinus disease with a dehiscence of the fovea ethmoidalis.  She has a history of meningitis about 10 years ago and at that time was also related to chronic disease.  She underwent bilateral endoscopic ethmoidectomy performed by Kristine Garbe. Ezzard Standing, M.D.  She was recovering nicely from the recent seizure problem. Risks, benefits, alternatives, complications of the procedure explained to the patient and several of her children,  who seemed to understand and agreed to surgery.  DESCRIPTION OF PROCEDURE:  The patient was taken to the operating room and placed on the operating table in supine position.  Following induction of general endotracheal anesthesia, the face was prepped and draped in a standard fashion.  1. Left endoscopic anterior ethmoidectomy.  The 0 degree nasal endoscope was used to visualize the left nasal cavity and ethmoid region.  Photographs were taken as well for documentation.  The left middle turbinate remnant and lateral nasal wall was injected, infiltrated with 1% Xylocaine with epinephrine.  Afrin-soaked pledgets were then placed in the middle meatus area as well for topical vasoconstriction.  Careful review of the CT scans revealed an anterior ethmoid mucocele surrounded by thick, most likely osteitic, bone. A sinus seeker was used to attempt to enter into this cell but was not successful due to the thickness of the bone.  A straight through-cut forceps was then used to chew away small bits of bone of the anterior inferior aspect of this cell until the inner lining was identified.  Valsalva maneuver was used to assure that this was not dura that was exposed, and it did not seem to be.  The sinus seeker was then used to gently tease this mucosa off the surrounding bone, and eventually the mucosa was punctured and thick mucoid material was aspirated.  Following this, the bony opening was enlarged using combination of through-cut forceps and the curved suction.  The entire cell was opened widely.  The anterior wall was taken down up to the fovea. Anterior to this, there was all polypoid tissue that was carefully removed bit by bit.  Just superior to this polypoid tissue was thought to be the foveal defect.  Care was taken not to enter into the dura, and no CSF leak was identified.  Some of the polypoid tissue was left in place in order to avoid any damage to the dura or to the frontal  lobe.  2. Left endoscopic frontal sinusotomy.  After having opened up this cell and taking down the anterior wall, the curved suction was then used to explore the lateral aspect and the roof of the mucocele.  Gentle manipulation of the mucosal tissue up superiorly and laterally and anteriorly.  The frontal sinus was opened and the similar type of mucoid material drained out of the frontal sinus.  I carefully removed any polypoid tissue from this area and all the abnormal mucosa from the mucocele.  There was minimal bleeding.  The cranial fossa did not appear to be entered with any of the instruments.  Blood and secretions were aspirated.  The ethmoid cavity was packed with an ethmoid Kennedy pack, which was then saturated with the local anesthetic solution. The patient was then awakened from anesthesia, extubated, and transferred to recovery in stable condition.  The pupillary response was normal throughout the entire procedure. DD:  10/02/00 TD:  10/05/00 Job: [id number]

## 2010-10-18 NOTE — Discharge Summary (Signed)
NAME:  [name], [name] NO.:  [id number]   MEDICAL RECORD NO.:  [id number]          PATIENT TYPE:  OUT   LOCATION:  EKG                          FACILITY:  [facility]   PHYSICIAN:  Asher Muir, MD   DATE OF BIRTH:  [birth year]   DATE OF ADMISSION:  12/10/2006  DATE OF DISCHARGE:  12/11/2006                               DISCHARGE SUMMARY   PRIMARY CARE PHYSICIAN:  Dr. Gavin Potters at [facility].   CONSULTATIONS:  None.   PROCEDURES:  None.   REASON FOR ADMISSION:  Chest pain.   DISCHARGE DIAGNOSIS:  Atypical chest pain.   SECONDARY DIAGNOSES:  1. Hypertension.  2. Chronic renal failure.  3. Dysphagia.   DISCHARGE MEDICATIONS:  1. Protonix 40 mg p.o. b.i.d.  2. Aspirin 81 mg p.o. daily.  3. Metoprolol 200 mg p.o. daily.  4. Norvasc 10 mg p.o. daily.  5. Ultram 50 mg 1-2 tabs p.o. every 6 hours as needed for pain.  6. Allopurinol 100 mg p.o. daily.  7. Lipitor 40 mg p.o. daily.  8. Micardis 80 mg p.o. daily.  9. Nitroglycerin 0.4 mL sublingually every 5 minutes as needed for      chest pain up to 3 times.   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  Admitted for rule out of an MI.   1. Chest pain:  The patient is with the risk factors including      hypertension, hyperlipidemia, long smoking history, with a chest      pressure that was constant for 2 weeks.  Chest pain is not terribly      typical, but the patient does have multiple risk factors.  A      catheterization in 2003 showed nonocclusive disease.  EKG done      showed no acute ST changes or T wave changes.  The patient was      started on a beta blocker, metoprolol.  Given aspirin 325 mg, and      started on heparin per pharmacy.  Three sets of cardiac enzymes      were obtained, all three came back negative.   Heparin was      discontinued.  Aspirin was changed to 81 mg by mouth daily.  The      patient has past problems with dysphagia, and this pain may have a      gastrointestinal origin.  The patient was referred to the      [facility].  The patient chose to make      her own appointment for heart Myoview.  She had some concerns about      financial obligations.  She was given the phone number to make the      appointment.  2. Hypertension:  The patient had not been taking her home medicines.      These were restarted during her hospital stay.  She was given  metoprolol and Norvasc.  Discharge her on metoprolol 200 per day      and Norvasc 10 per day.  Additionally, Micardis 80 mg today was      started.  Records indicate that her renal doctor had put her on      this, so we reinstituted that.  3. Dysphagia:  No GI workup was done in the hospital, as that was not      seen as emergent.  The patient was given Protonix 40 mg b.i.d.  4. Chronic renal failure:  This is secondary to FSGS.  The patient has      a baseline creatinine of 2.  Her creatinine in the hospital is      1.78.  She is not on ACE inhibitor due to lisinopril having caused      a cough in the past.   DISPOSITION:  The patient was discharged to home in stable condition.   DISCHARGE FOLLOWUP:  The patient was to follow up today, July 15, with  Dr. [name] at the [facility].   FOLLOWUP ISSUES:  The patient is recommended to make her appointment for  a heart Myoview.  The patient is to continue have her hypertension  monitored to make sure it stabilizes, and the patient should continue to  see her nephrologist to follow her renal failure from FSGS.      Asher Muir, MD  Electronically Signed     SO/MEDQ  D:  12/16/2006  T:  12/16/2006  Job:  [id number]

## 2010-10-30 ENCOUNTER — Encounter: Payer: Self-pay | Admitting: Family Medicine

## 2010-11-02 ENCOUNTER — Other Ambulatory Visit: Payer: Self-pay | Admitting: Family Medicine

## 2010-11-04 ENCOUNTER — Other Ambulatory Visit: Payer: Self-pay | Admitting: Family Medicine

## 2010-11-04 MED ORDER — ATORVASTATIN CALCIUM 40 MG PO TABS: 40.0000 mg | ORAL_TABLET | Freq: Every day | ORAL | Status: DC

## 2010-11-04 MED ORDER — METOPROLOL TARTRATE 100 MG PO TABS: 100.0000 mg | ORAL_TABLET | Freq: Two times a day (BID) | ORAL | Status: DC

## 2010-11-04 NOTE — Telephone Encounter (Signed)
Refill request

## 2010-12-30 ENCOUNTER — Ambulatory Visit: Payer: Medicare Other | Admitting: Family Medicine

## 2011-02-07 ENCOUNTER — Ambulatory Visit: Payer: Medicare Other | Admitting: Family Medicine

## 2011-02-17 ENCOUNTER — Other Ambulatory Visit: Payer: Self-pay | Admitting: Family Medicine

## 2011-02-17 MED ORDER — METOPROLOL TARTRATE 100 MG PO TABS: 100.0000 mg | ORAL_TABLET | Freq: Two times a day (BID) | ORAL | Status: DC

## 2011-02-27 LAB — POCT URINALYSIS DIP (DEVICE)
Glucose, UA: NEGATIVE
Specific Gravity, Urine: 1.015
Urobilinogen, UA: 0.2
pH: 5

## 2011-02-27 LAB — URINE CULTURE: Colony Count: NO GROWTH

## 2011-02-28 LAB — BASIC METABOLIC PANEL
BUN: 24 — ABNORMAL HIGH
CO2: 19
Calcium: 9
GFR calc non Af Amer: 28 — ABNORMAL LOW
Glucose, Bld: 114 — ABNORMAL HIGH
Glucose, Bld: 119 — ABNORMAL HIGH
Potassium: 3.9
Sodium: 140
Sodium: 141

## 2011-02-28 LAB — CBC
Hemoglobin: 10.8 — ABNORMAL LOW
MCHC: 33
MCHC: 33.5
RBC: 4.13
RDW: 14.9

## 2011-02-28 LAB — URINE CULTURE

## 2011-02-28 LAB — DIFFERENTIAL
Eosinophils Absolute: 0.2
Eosinophils Relative: 3
Lymphs Abs: 2
Monocytes Absolute: 0.7
Monocytes Relative: 9

## 2011-02-28 LAB — URINALYSIS, ROUTINE W REFLEX MICROSCOPIC
Bilirubin Urine: NEGATIVE
Glucose, UA: NEGATIVE
Hgb urine dipstick: NEGATIVE
Ketones, ur: NEGATIVE
Protein, ur: 100 — AB

## 2011-02-28 LAB — COMPREHENSIVE METABOLIC PANEL
ALT: 14
AST: 16
Albumin: 3.2 — ABNORMAL LOW
CO2: 21
Calcium: 9.3
GFR calc Af Amer: 23 — ABNORMAL LOW
GFR calc non Af Amer: 19 — ABNORMAL LOW
Sodium: 142
Total Protein: 7.3

## 2011-02-28 LAB — CLOSTRIDIUM DIFFICILE EIA: C difficile Toxins A+B, EIA: NEGATIVE

## 2011-02-28 LAB — URINE MICROSCOPIC-ADD ON

## 2011-03-18 LAB — LIPID PANEL
Cholesterol: 153
HDL: 41
Total CHOL/HDL Ratio: 3.7
Triglycerides: 92

## 2011-03-18 LAB — APTT: aPTT: >200

## 2011-03-18 LAB — BENZODIAZEPINE, QUANTITATIVE, URINE
Flurazepam GC/MS Conf: NEGATIVE
Oxazepam GC/MS Conf: NEGATIVE

## 2011-03-18 LAB — COMPREHENSIVE METABOLIC PANEL
AST: 13
Albumin: 3 — ABNORMAL LOW
BUN: 23
Calcium: 9.6
Chloride: 110
Creatinine, Ser: 1.92 — ABNORMAL HIGH
GFR calc Af Amer: 32 — ABNORMAL LOW
Total Bilirubin: 0.3
Total Protein: 6.8

## 2011-03-18 LAB — BASIC METABOLIC PANEL
BUN: 22
Calcium: 9.3
Creatinine, Ser: 1.78 — ABNORMAL HIGH
GFR calc Af Amer: 35 — ABNORMAL LOW
GFR calc non Af Amer: 29 — ABNORMAL LOW

## 2011-03-18 LAB — CBC
MCHC: 32.5
MCV: 82.4
MCV: 82.9
Platelets: 248
Platelets: 271
RDW: 16.8 — ABNORMAL HIGH
WBC: 7.5

## 2011-03-18 LAB — TSH: TSH: 1.253

## 2011-03-18 LAB — URINE MICROSCOPIC-ADD ON

## 2011-03-18 LAB — DIFFERENTIAL
Basophils Relative: 1
Eosinophils Relative: 3
Lymphocytes Relative: 26
Lymphs Abs: 2
Monocytes Relative: 10
Neutro Abs: 4.4

## 2011-03-18 LAB — PROTIME-INR
INR: 1
INR: 1.2
Prothrombin Time: 15.5 — ABNORMAL HIGH

## 2011-03-18 LAB — URINALYSIS, ROUTINE W REFLEX MICROSCOPIC
Glucose, UA: NEGATIVE
Hgb urine dipstick: NEGATIVE
Leukocytes, UA: NEGATIVE
Protein, ur: 30 — AB
Specific Gravity, Urine: 1.009

## 2011-03-18 LAB — URINE DRUGS OF ABUSE SCREEN W ALC, ROUTINE (REF LAB)
Amphetamine Screen, Ur: NEGATIVE
Barbiturate Quant, Ur: NEGATIVE
Cocaine Metabolites: NEGATIVE
Creatinine,U: 241.2
Ethyl Alcohol: <5
Methadone: NEGATIVE

## 2011-03-18 LAB — CARDIAC PANEL(CRET KIN+CKTOT+MB+TROPI)
CK, MB: 1.8
Relative Index: INVALID
Total CK: 59
Troponin I: 0.02
Troponin I: 0.02

## 2011-03-18 LAB — HEPARIN LEVEL (UNFRACTIONATED): Heparin Unfractionated: 0.8 — ABNORMAL HIGH

## 2011-03-26 ENCOUNTER — Other Ambulatory Visit: Payer: Self-pay | Admitting: Family Medicine

## 2011-03-26 MED ORDER — ATORVASTATIN CALCIUM 40 MG PO TABS: 40.0000 mg | ORAL_TABLET | Freq: Every day | ORAL | Status: DC

## 2011-03-26 MED ORDER — SERTRALINE HCL 100 MG PO TABS: 100.0000 mg | ORAL_TABLET | Freq: Every day | ORAL | Status: DC

## 2011-03-26 MED ORDER — AMLODIPINE BESYLATE 10 MG PO TABS: 10.0000 mg | ORAL_TABLET | Freq: Every day | ORAL | Status: DC

## 2011-05-06 ENCOUNTER — Ambulatory Visit: Payer: Medicare Other | Admitting: Family Medicine

## 2011-06-07 ENCOUNTER — Other Ambulatory Visit: Payer: Self-pay | Admitting: Family Medicine

## 2011-06-07 DIAGNOSIS — H6690 Otitis media, unspecified, unspecified ear: Secondary | ICD-10-CM

## 2011-06-07 DIAGNOSIS — I10 Essential (primary) hypertension: Secondary | ICD-10-CM

## 2011-06-07 MED ORDER — OMEPRAZOLE 20 MG PO TBEC: 1.0000 | DELAYED_RELEASE_TABLET | Freq: Every day | ORAL | Status: DC

## 2011-06-07 MED ORDER — SERTRALINE HCL 100 MG PO TABS: 100.0000 mg | ORAL_TABLET | Freq: Every day | ORAL | Status: DC

## 2011-06-07 MED ORDER — ALLOPURINOL 100 MG PO TABS: 100.0000 mg | ORAL_TABLET | Freq: Every day | ORAL | Status: DC

## 2011-06-07 MED ORDER — OXYBUTYNIN CHLORIDE 5 MG PO TABS: 5.0000 mg | ORAL_TABLET | Freq: Two times a day (BID) | ORAL | Status: DC

## 2011-06-07 MED ORDER — LORATADINE 10 MG PO TABS: 10.0000 mg | ORAL_TABLET | Freq: Every day | ORAL | Status: DC | PRN

## 2011-06-07 MED ORDER — ATORVASTATIN CALCIUM 40 MG PO TABS: 40.0000 mg | ORAL_TABLET | Freq: Every day | ORAL | Status: DC

## 2011-06-07 MED ORDER — AMLODIPINE BESYLATE 10 MG PO TABS: 10.0000 mg | ORAL_TABLET | Freq: Every day | ORAL | Status: DC

## 2011-06-07 MED ORDER — FLUTICASONE PROPIONATE 50 MCG/ACT NA SUSP: 1.0000 | Freq: Every day | NASAL | Status: DC

## 2011-06-07 MED ORDER — CLONIDINE HCL 0.1 MG PO TABS: 0.1000 mg | ORAL_TABLET | Freq: Three times a day (TID) | ORAL | Status: DC

## 2011-06-07 MED ORDER — HYDRALAZINE HCL 50 MG PO TABS: 50.0000 mg | ORAL_TABLET | Freq: Three times a day (TID) | ORAL | Status: DC

## 2011-06-07 NOTE — Telephone Encounter (Signed)
Refill request

## 2011-06-09 ENCOUNTER — Other Ambulatory Visit: Payer: Self-pay | Admitting: Family Medicine

## 2011-06-09 NOTE — Telephone Encounter (Signed)
Refill request

## 2011-07-29 ENCOUNTER — Other Ambulatory Visit: Payer: Self-pay | Admitting: Family Medicine

## 2011-07-29 NOTE — Telephone Encounter (Signed)
Refill request

## 2011-08-13 ENCOUNTER — Encounter: Payer: Self-pay | Admitting: Family Medicine

## 2011-08-13 ENCOUNTER — Ambulatory Visit (INDEPENDENT_AMBULATORY_CARE_PROVIDER_SITE_OTHER): Payer: Medicare Other | Admitting: Family Medicine

## 2011-08-13 VITALS — BP 171/74 | HR 60 | Temp 97.7°F | Ht 62.0 in | Wt 143.5 lb

## 2011-08-13 DIAGNOSIS — F329 Major depressive disorder, single episode, unspecified: Secondary | ICD-10-CM | POA: Diagnosis not present

## 2011-08-13 DIAGNOSIS — R1310 Dysphagia, unspecified: Secondary | ICD-10-CM

## 2011-08-13 DIAGNOSIS — R5381 Other malaise: Secondary | ICD-10-CM

## 2011-08-13 DIAGNOSIS — E7800 Pure hypercholesterolemia, unspecified: Secondary | ICD-10-CM | POA: Diagnosis not present

## 2011-08-13 DIAGNOSIS — R110 Nausea: Secondary | ICD-10-CM | POA: Diagnosis not present

## 2011-08-13 DIAGNOSIS — K3184 Gastroparesis: Secondary | ICD-10-CM

## 2011-08-13 DIAGNOSIS — I10 Essential (primary) hypertension: Secondary | ICD-10-CM | POA: Diagnosis not present

## 2011-08-13 DIAGNOSIS — N184 Chronic kidney disease, stage 4 (severe): Secondary | ICD-10-CM | POA: Diagnosis not present

## 2011-08-13 LAB — COMPREHENSIVE METABOLIC PANEL
ALT: <8 U/L (ref 0–35)
AST: 12 U/L (ref 0–37)
Calcium: 10.1 mg/dL (ref 8.4–10.5)
Chloride: 112 mEq/L (ref 96–112)
Creat: 4.36 mg/dL — ABNORMAL HIGH (ref 0.50–1.10)
Potassium: 4 mEq/L (ref 3.5–5.3)
Sodium: 139 mEq/L (ref 135–145)

## 2011-08-13 LAB — LDL CHOLESTEROL, DIRECT: Direct LDL: 76 mg/dL

## 2011-08-13 LAB — CBC
HCT: 35.7 % — ABNORMAL LOW (ref 36.0–46.0)
MCHC: 31.1 g/dL (ref 30.0–36.0)
RDW: 16.3 % — ABNORMAL HIGH (ref 11.5–15.5)

## 2011-08-13 MED ORDER — MIRTAZAPINE 15 MG PO TABS: 15.0000 mg | ORAL_TABLET | Freq: Every day | ORAL | Status: DC

## 2011-08-13 MED ORDER — TRAMADOL HCL 50 MG PO TABS: 50.0000 mg | ORAL_TABLET | Freq: Two times a day (BID) | ORAL | Status: DC | PRN

## 2011-08-13 MED ORDER — METOCLOPRAMIDE HCL 5 MG/5ML PO SOLN: 5.0000 mg | Freq: Three times a day (TID) | ORAL | Status: DC

## 2011-08-13 NOTE — Patient Instructions (Signed)
It was good to see you today. I have sent in a prescription for your Reglan. I want you to change from taking 2 pills of your Zoloft to one pill daily. I also want you to start taking the medicine Remeron. I have sent in a prescription for this. Your dose will be 15 mg. Come back to see me in a week or a week and a half so we can talk about the results and see how the changes in your medicines are working for you.

## 2011-08-13 NOTE — Progress Notes (Signed)
Subjective: The patient is a 71 y.o. year old female who presents today for multiple issues.  1) CKD: Pt has not been following up with anyone about her kidney disease.  She says she has been to worried about it to hear any bad news.  She is still urinating and has not had any reported changes in mental status.  No significant pruritis or edema.  Is interested in checking her renal function today.  2) Gastroparesis: Pt has had long-standing problems with gastroparesis.  She is actually here primarily because I refused to refill her reglan, since she had not been seen in almost a year.  She reports significant problems with nausea/emesis with meals, especially since discontinuing of her reglan.  No blood in emesis.  She does have chronic problems with diarrhea as well and reports this has continued.  No blood in stool but she does have significant discomfort in the anal area due to repeated wiping.  No problems with tardiv-like symptoms on reglan.  3) HTN: Does not monitor blood pressure at home.  Compliant with meds, per report.  No headaches, chest pain, SOB, LE edema.  4) Depression: Reports chronic problems with depressed mood.  Is currently taking 200mg  of Zoloft daily with no effect per her report.  She has been on this for several years.  She has been on other medications in the past but cannot remember what they are.  No SI/HI.  Smoking Status: Continues to smoke about 2 packs per week.  Objective:  Filed Vitals:   08/13/11 1026  BP: 171/74  Pulse: 60  Temp: 97.7 F (36.5 C)   Gen: Overweight, anxious appearing CV: RRR, no murmurs appreciated, no JVD Resp: CTABL Abd: Soft, NT/ND, BS present Ext: No edmea, 2+ pulses Skin: No rashes or evidence of excoriation  Assessment/Plan:  Please also see individual problems in problem list for problem-specific plans.

## 2011-08-18 ENCOUNTER — Telehealth: Payer: Self-pay | Admitting: Family Medicine

## 2011-08-18 ENCOUNTER — Encounter: Payer: Self-pay | Admitting: Family Medicine

## 2011-08-18 NOTE — Telephone Encounter (Signed)
Pt was given script for Reglan liquid and would like the pill form [facility] [place]

## 2011-08-18 NOTE — Telephone Encounter (Signed)
Ok per Dr. Louanne Belton to change to Reglan tabs.  Patient states she has not started the med yet, but has taken the tabs in the past and would prefer tablets.  [facility] informed to change to tablet form---Gen. Reglan 5 mg tabs #1 month supply---take 1 tab TID before each meal and at bedtime.   Gaylene Brooks, RN

## 2011-08-23 DIAGNOSIS — K3184 Gastroparesis: Secondary | ICD-10-CM | POA: Insufficient documentation

## 2011-08-23 NOTE — Assessment & Plan Note (Signed)
I do not want to make too many changes in patient's meds at this visit.  No current symptoms from hypertension.  Will have patient folllow up in about two weeks and will make bp changes at that time.

## 2011-08-23 NOTE — Assessment & Plan Note (Signed)
Discussed some of the risks/benefits of long-term Reglan with patient.  As she feels her symptoms have gotten significantly worse since stopping it we will restart and monitor carefully for any side effects.  Pt does have weight loss is last year but has not been seeing physicians.  She will need close follow up for this issue.  I offered this during this visit.  We shall see if the patient will follow through on her end.

## 2011-08-23 NOTE — Assessment & Plan Note (Signed)
Check Cr today.  Encouraged to f/u with renal.  No current evidence of progression of disease, from history.

## 2011-08-23 NOTE — Assessment & Plan Note (Signed)
Due to high PHQ-9 score (23) and problems with chronic insomnia along with some decreased appetite, will try a cross-taper to Remron and monitor for effect.  F/U 2 weeks to monitor.  PHQ-9 at that time.

## 2011-08-23 NOTE — Assessment & Plan Note (Signed)
Check direct LDL today.  °

## 2011-08-26 ENCOUNTER — Ambulatory Visit: Payer: Medicare Other | Admitting: Family Medicine

## 2011-08-26 DIAGNOSIS — K3184 Gastroparesis: Secondary | ICD-10-CM

## 2011-09-03 ENCOUNTER — Ambulatory Visit (INDEPENDENT_AMBULATORY_CARE_PROVIDER_SITE_OTHER): Payer: Medicare Other | Admitting: Family Medicine

## 2011-09-03 VITALS — HR 76 | Temp 98.7°F

## 2011-09-03 DIAGNOSIS — N184 Chronic kidney disease, stage 4 (severe): Secondary | ICD-10-CM

## 2011-09-03 DIAGNOSIS — L600 Ingrowing nail: Secondary | ICD-10-CM | POA: Diagnosis not present

## 2011-09-03 DIAGNOSIS — L609 Nail disorder, unspecified: Secondary | ICD-10-CM

## 2011-09-03 MED ORDER — CEPHALEXIN 250 MG PO CAPS: 250.0000 mg | ORAL_CAPSULE | Freq: Two times a day (BID) | ORAL | Status: AC

## 2011-09-03 NOTE — Progress Notes (Signed)
  Subjective:    Patient ID: [name], female    DOB: 1940/09/27, 71 y.o.   MRN: [id number]  HPI  Right great toe pain with some pus coming from nail bed area. Noted pus last night----has been painful for a few days. Has had some type of "nail removal" in past.  PERTINENT  PMH / PSH: Stage 4 CKD Gout Allergy to PCN (not anaphylaxis--tolerated omnicef in past)  Review of Systems Denies fever sweats or chlls    Objective:   Physical Exam  Vital signs reviewed. GENERAL: Well developed, well nourished, no acute distress Left great toe red, warm ttp medial nail bed. No fluctuance, no pus. Nail is severely deformed by curvature and ingorwn      Assessment & Plan:  Nail deformity with infection Keflex (renally dosed---allergies reviewed) F/u for nail removal with nail bed ablation in 1 week

## 2011-09-08 ENCOUNTER — Telehealth: Payer: Self-pay | Admitting: Family Medicine

## 2011-09-08 NOTE — Telephone Encounter (Signed)
And I don't think she should need anything prescription for anxiety for the procedure Dr. Jennette Kettle has planned.  She is always welcome to take some benadryl (OTC) that morning.  It can make you sleepy and a little less anxious.

## 2011-09-08 NOTE — Telephone Encounter (Signed)
Dear Cliffton Asters Team I am confused how she can have COMPLETED  The 10 day abx??? I would reassure her and I will see THANKS! Denny Levy  her Thursday

## 2011-09-08 NOTE — Telephone Encounter (Signed)
Patient is calling because she has completed the 10 abx and wants to know if it is reasonable for her toe to still be swollen and sore to the point she cannot sleep at night.  She is scheduled to come in to the [facility] on Thursday and is very nervous and would like something to calm for that day as well.  She would like for either Dr. Jennette Kettle or Dr. Louanne Belton to call her.

## 2011-09-09 NOTE — Telephone Encounter (Signed)
Attempted to lvm but the phone rang and rang. To tell her of what Dr. Louanne Belton suggested for her to take OTC Benadryl the morning of the procedure it can cause her to be sleep and a little less anxious.Laureen Ochs, Viann Shove

## 2011-09-10 NOTE — Telephone Encounter (Signed)
Called and informed pt of Dr. Radonna Ricker suggestion. Pt understood and agreed.[name] [place]

## 2011-09-11 ENCOUNTER — Ambulatory Visit (INDEPENDENT_AMBULATORY_CARE_PROVIDER_SITE_OTHER): Payer: Medicare Other | Admitting: Family Medicine

## 2011-09-11 VITALS — BP 150/68 | HR 76 | Temp 98.1°F | Ht 62.0 in | Wt 146.0 lb

## 2011-09-11 DIAGNOSIS — L600 Ingrowing nail: Secondary | ICD-10-CM

## 2011-09-11 DIAGNOSIS — L02619 Cutaneous abscess of unspecified foot: Secondary | ICD-10-CM | POA: Diagnosis not present

## 2011-09-11 DIAGNOSIS — L603 Nail dystrophy: Secondary | ICD-10-CM

## 2011-09-11 DIAGNOSIS — L608 Other nail disorders: Secondary | ICD-10-CM

## 2011-09-11 NOTE — Patient Instructions (Signed)
Keep toe dry and covered for the next 24 hours. Then you may remove bandage and clean with hydrogen peroxide.  If you start having fevers or notice worsening redness/swelling/tenderness in your toe, please make an appointment at the clinic to have your toe evaluated for possible infection.

## 2011-09-12 NOTE — Progress Notes (Signed)
Patient ID: [name], female   DOB: February 03, 1941, 71 y.o.   MRN: [id number] Here for removal of toenail nail bed ablation. She had completed antibiotics Rx in her toe has improved. She was occasionally still having some pain. Ingrown nail, no infection.  Patient given informed consent, signed copy in the chart. Appropriate time out taken.  Large rubberband with forceps used as tourniquet at base of the toe.  Digital block done using 2% lidocaine without epinephrine, 5cc total.  Area prepped and draped in usual sterile fashion. Nail elevated using nail elevator, removed using hemostats and traction force.  Nail bed was ablated using curettage and phenol followed by copious alcohol wash.  No complications.  Minimal bleeding.  Sterile bandage applied and post procedure instructions given.  Patient tolerated procedure well without complications.

## 2011-09-29 ENCOUNTER — Encounter: Payer: Self-pay | Admitting: *Deleted

## 2011-10-08 ENCOUNTER — Encounter: Payer: Self-pay | Admitting: Family Medicine

## 2011-10-08 ENCOUNTER — Ambulatory Visit (INDEPENDENT_AMBULATORY_CARE_PROVIDER_SITE_OTHER): Payer: Medicare Other | Admitting: Family Medicine

## 2011-10-08 VITALS — BP 177/72 | HR 70 | Temp 98.1°F | Ht 62.0 in | Wt 144.9 lb

## 2011-10-08 DIAGNOSIS — L02519 Cutaneous abscess of unspecified hand: Secondary | ICD-10-CM | POA: Diagnosis not present

## 2011-10-08 DIAGNOSIS — L03039 Cellulitis of unspecified toe: Secondary | ICD-10-CM | POA: Diagnosis not present

## 2011-10-08 DIAGNOSIS — L02619 Cutaneous abscess of unspecified foot: Secondary | ICD-10-CM | POA: Diagnosis not present

## 2011-10-08 DIAGNOSIS — IMO0002 Reserved for concepts with insufficient information to code with codable children: Secondary | ICD-10-CM

## 2011-10-08 MED ORDER — CEPHALEXIN 500 MG PO TABS: 500.0000 mg | ORAL_TABLET | Freq: Two times a day (BID) | ORAL | Status: AC

## 2011-10-08 NOTE — Progress Notes (Signed)
  Subjective:    Patient ID: [name], female    DOB: 28-Nov-1940, 71 y.o.   MRN: [id number]  HPI  Followup removal of ingrown toenail and ablation of nailbed. It has been using some pus for about 2 weeks. She kept thinking to get better so she didn't come in. She's had no fever. It has been tender but the foot has not felt hot and the foot itself does not hurt, the toe still was very tender.  Review of Systems Denies fever, sweats, chills.    Objective:   Physical Exam  Vital signs are reviewed GENERAL: Well-developed female no acute distress Extremity: Right great toe is without erythema. The nailbed is using some yellowish fluid appear somewhat tender. The IP joint is nontender. There is no redness of the skin.      Assessment & Plan:  Mild nailbed infection. We'll use topical dressing and Keflex. Please see patient instructions for details I'll see her back in one week.

## 2011-10-08 NOTE — Patient Instructions (Signed)
Soak your foot once or twice a day in WAM (not hot) water. Dry it, cleanse with small amount hydrogen peroxide, dry again. Place a small amount of antibiotic ointment and a telfa type pad over the toe. Wrap this with a bandaid.   I am starting you on an antibiotic---if you have any problems with it please call me. Let me see you in a week.

## 2011-10-15 ENCOUNTER — Ambulatory Visit (INDEPENDENT_AMBULATORY_CARE_PROVIDER_SITE_OTHER): Payer: Medicare Other | Admitting: Family Medicine

## 2011-10-15 ENCOUNTER — Encounter: Payer: Self-pay | Admitting: Family Medicine

## 2011-10-15 VITALS — BP 182/84 | HR 89 | Temp 97.6°F | Ht 62.0 in | Wt 143.7 lb

## 2011-10-15 DIAGNOSIS — IMO0002 Reserved for concepts with insufficient information to code with codable children: Secondary | ICD-10-CM

## 2011-10-15 DIAGNOSIS — L02519 Cutaneous abscess of unspecified hand: Secondary | ICD-10-CM

## 2011-10-15 DIAGNOSIS — L03019 Cellulitis of unspecified finger: Secondary | ICD-10-CM | POA: Diagnosis not present

## 2011-10-15 DIAGNOSIS — L02619 Cutaneous abscess of unspecified foot: Secondary | ICD-10-CM | POA: Diagnosis not present

## 2011-10-15 NOTE — Progress Notes (Signed)
  Subjective:    Patient ID: [name], female    DOB: [birth year], 71 y.o.   MRN: [id number]  HPI  Followup right great toe cellulitis. She feels like it's 100% better. Still a little bit tender in her shoes use it but essentially no pain. Has had no more drainage.  Review of Systems Denies fevers.    Objective:   Physical Exam  Vital signs are reviewed. Notably she reports just taking her blood pressure medicine about 15 minutes before she arrived. RIGHT great toe: Nailbed is without sign of infection. There is no exudate. Nontender. The IP joint is without tenderness or erythema.      Assessment & Plan:  Successful removal right great toenail with cellulitis which is now resolved. At some point she tried to get the left one done. I told her we probably just prophylactically treat her with antibiotic at that time when she decides to have it done. She'll followup with me when necessary with her regular provider on her regular schedule.

## 2011-10-31 ENCOUNTER — Ambulatory Visit (INDEPENDENT_AMBULATORY_CARE_PROVIDER_SITE_OTHER): Payer: Medicare Other | Admitting: Family Medicine

## 2011-10-31 VITALS — BP 158/72 | Ht 62.0 in | Wt 146.0 lb

## 2011-10-31 DIAGNOSIS — M25559 Pain in unspecified hip: Secondary | ICD-10-CM

## 2011-10-31 MED ORDER — AMITRIPTYLINE HCL 10 MG PO TABS: ORAL_TABLET | ORAL | Status: DC

## 2011-10-31 NOTE — Progress Notes (Signed)
  Subjective:    Patient ID: [name], female    DOB: Mar 01, 1941, 71 y.o.   MRN: [id number]  HPI  Bilateral hip pain with quadriceps spasm for the last 4-5 months. It is worse if she does a lot of walking. Heating pad does seem to help. Pain starts in bilateral posterior hips particularly the right one. It is aching in nature. If she takes tramadol, it helps the pain but then she feels drugged out for the next 36 hours. She has not been taking over-the-counter Tylenol. She cannot take NSAIDs secondary to kidney disease. Has long history of restless leg syndrome and some of the symptoms are exacerbated by the hip and quadricep pain.  PERTINENT  PMH / PSH: Stage IV kidney disease   Review of Systems Denies unusual weight change recently, denies fever, sweats, chills. No numbness in her lower extremities.    Objective:   Physical Exam  GENERAL: Well-developed female no acute distress vital signs are reviewed and elevated blood pressure is noted. HIPS: Tender to palpation along both lateral hips. The left one is exquisitely tender over the greater trochanteric bursa area but this does not necessarily reproduce her pain. She is also tender to palpation in the mid-gluteus muscle area bilaterally. Internal rotation of the hip is full on both sides. External rotation is a little bit limited to about 30 and it is painful particularly on the left. Flexion at the hips is normal and she is quite flexible and hamstrings. PELVIS: Negative Trendelenburg. IMAGING: I reviewed her CT scan of the pelvis from 2010. It does show in the scalp film that she Ardee was exhibiting some sclerosis and osteophytes on both hips.      Assessment & Plan:

## 2011-10-31 NOTE — Patient Instructions (Addendum)
Please start tylenol ( acetaminophen ) 1000 mg (2 of the 500 tabs at a time)... Take three times a day regularly for the next two weeks until I see you back. I am also starting the amitriptilene at night Please get some x rays at your convenience Let me see you back in 2 weeks or so

## 2011-11-05 ENCOUNTER — Telehealth: Payer: Self-pay | Admitting: *Deleted

## 2011-11-06 ENCOUNTER — Ambulatory Visit
Admission: RE | Admit: 2011-11-06 | Discharge: 2011-11-06 | Disposition: A | Discharge status: Home / Self Care | Payer: Medicare Other | Source: Ambulatory Visit | Attending: Family Medicine | Admitting: Family Medicine

## 2011-11-06 DIAGNOSIS — M25559 Pain in unspecified hip: Secondary | ICD-10-CM

## 2011-11-06 DIAGNOSIS — M169 Osteoarthritis of hip, unspecified: Secondary | ICD-10-CM | POA: Diagnosis not present

## 2011-11-06 DIAGNOSIS — M79609 Pain in unspecified limb: Secondary | ICD-10-CM | POA: Diagnosis not present

## 2011-11-07 MED ORDER — HYDROCODONE-ACETAMINOPHEN 5-500 MG PO TABS: ORAL_TABLET | ORAL | Status: DC

## 2011-11-07 NOTE — Telephone Encounter (Signed)
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 I cannot give her NSAIDS  Due to CKD stage 4. We can TRY some low dose vicodin--her allergy list does not say what her reaction was to codeine--please review---if it was SOB or lip swelling or horrible rash then I am reluctant to try vicodin. Let me know If OK to try vicodin, we will call in low dose as below. THANKS! Denny Levy

## 2011-11-07 NOTE — Telephone Encounter (Signed)
Pt states vicodin makes her very drowsy- she had no shortness of breath, lip swelling or rash.  She does want to try a low dose of vicodin.

## 2011-11-21 ENCOUNTER — Ambulatory Visit: Payer: Medicare Other | Admitting: Family Medicine

## 2011-11-24 ENCOUNTER — Ambulatory Visit (INDEPENDENT_AMBULATORY_CARE_PROVIDER_SITE_OTHER): Payer: Medicare Other | Admitting: Family Medicine

## 2011-11-24 VITALS — BP 148/77

## 2011-11-24 DIAGNOSIS — M545 Low back pain, unspecified: Secondary | ICD-10-CM | POA: Diagnosis not present

## 2011-11-24 DIAGNOSIS — M25559 Pain in unspecified hip: Secondary | ICD-10-CM

## 2011-11-24 MED ORDER — HYDROCODONE-ACETAMINOPHEN 5-500 MG PO TABS: ORAL_TABLET | ORAL | Status: DC

## 2011-11-24 NOTE — Patient Instructions (Addendum)
You have been scheduled for a MRI of your back on Friday 11/28/11 at 11:45am please arrive a few minutes early at [name]   Go to registration on the 1st floor.

## 2011-11-25 NOTE — Progress Notes (Signed)
  Subjective:    Patient ID: [name], female    DOB: [birth year], 71 y.o.   MRN: [id number]  HPI  Bilateral hip and leg pain. No better. She lost her prescription for the Vicodin. Was unable to try it. Decreased the amitriptyline to one fourth dose and tolerated it was still sleepy the next day. Really would like to avoid medication if at all possible. Pain 7/10 radiating particularly on the left anterior and lateral leg.  Review of Systems Pertinent review of systems: negative for fever or unusual weight change.      Objective:   Physical Exam Vital signs reviewed. GENERAL: Well developed, well nourished, no acute distress  BACK nontender to palpation. Limited flexion at hips secondary to stiffness.  HIPS: Limited ER B with some mild pain at extremes of ROM. IR lacks 5 degrees B. Lower extremity strength 5/5 B. 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 Look back at her MRI of 2009 of lumbar spine which shows some facet DJD and some lumbar canal stenosis.       Assessment & Plan:

## 2011-11-25 NOTE — Assessment & Plan Note (Signed)
With her left-sided radicular pain which is new and began to think that a lot of this hip pain is really coming from her back. Long discussion. Ultimately decided to repeat MRI. I gave her prescription for very low dose Vicodin. I will talk with her after the MRI. She would be amenable to potential surgical intervention or epidural steroid injection etc.

## 2011-11-28 ENCOUNTER — Ambulatory Visit (HOSPITAL_COMMUNITY)
Admission: RE | Admit: 2011-11-28 | Discharge: 2011-11-28 | Disposition: A | Discharge status: Home / Self Care | Payer: Medicare Other | Source: Ambulatory Visit | Attending: Family Medicine | Admitting: Family Medicine

## 2011-11-28 DIAGNOSIS — M545 Low back pain, unspecified: Secondary | ICD-10-CM | POA: Insufficient documentation

## 2011-11-28 DIAGNOSIS — M5137 Other intervertebral disc degeneration, lumbosacral region: Secondary | ICD-10-CM | POA: Diagnosis not present

## 2011-11-28 DIAGNOSIS — M5126 Other intervertebral disc displacement, lumbar region: Secondary | ICD-10-CM | POA: Diagnosis not present

## 2011-11-28 DIAGNOSIS — M79609 Pain in unspecified limb: Secondary | ICD-10-CM | POA: Insufficient documentation

## 2011-11-28 DIAGNOSIS — M47817 Spondylosis without myelopathy or radiculopathy, lumbosacral region: Secondary | ICD-10-CM | POA: Diagnosis not present

## 2011-12-02 ENCOUNTER — Telehealth: Payer: Self-pay | Admitting: Family Medicine

## 2011-12-02 DIAGNOSIS — M25559 Pain in unspecified hip: Secondary | ICD-10-CM

## 2011-12-02 DIAGNOSIS — M48061 Spinal stenosis, lumbar region without neurogenic claudication: Secondary | ICD-10-CM

## 2011-12-02 DIAGNOSIS — M545 Low back pain: Secondary | ICD-10-CM

## 2011-12-02 NOTE — Telephone Encounter (Signed)
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 I am unsure if this is what is causing her hip pain but I SUSPECT IT IS! I would recommend we get an opinion from a neurosurgeon on treatmentoptions---and also to see if he agrees that this is where it is coming from. I would like to set her up with [facility]--one of the guys at [facility] My choices would be Dr Dutch Quint, Dr Wynetta Emery, Dr Danielle Dess or Dr Lovell Sheehan

## 2011-12-02 NOTE — Telephone Encounter (Signed)
Pt is asking for Dr Louanne Belton to place orders for labs for her kidney failure.  She is not getting labs from her to her doctor now and wants Dr Louanne Belton to place orders for her to come here.  pls advise

## 2011-12-03 NOTE — Telephone Encounter (Signed)
Called pt- left message with her daughter to call back.

## 2011-12-03 NOTE — Telephone Encounter (Signed)
Unless she has a particular set of labs that her kidney doctor wants checked on a regular basis, I do not have particular labs that need to be checked more often than every six months.  She would only need new labs in September and she should be seen by me at that time anyway.

## 2011-12-03 NOTE — Telephone Encounter (Signed)
Spoke with pt- gave her the message about MRI from Dr. Jennette Kettle, she does want referral to neurosurgery.  She has seen Dr. Danielle Dess in the past, called and left message for a call back to schedule her appt.

## 2011-12-08 NOTE — Telephone Encounter (Signed)
Talked with andrea at from [facility] and she will contact pt. refaxed notes to [phone].  Office number is [phone]. Called pt and told her if she hasnt heard from them by Friday, to contact their office. Pt agreed

## 2011-12-08 NOTE — Telephone Encounter (Signed)
Patient has called back about the status of the labs she is requesting and the below message from Dr. Louanne Belton was relayed.  She understands that, but she wanted to let Dr. Louanne Belton know that she no longer has a kidney doctor and wants to know if that will change anything about the labs she is requesting.  She normally has the kidney labs every 3 months.

## 2011-12-08 NOTE — Telephone Encounter (Signed)
Amy I put in ordfer for [facility] referral--Dr. Danielle Dess since she has seen him. Please send ;ast two office notes THANKS! Denny Levy

## 2011-12-08 NOTE — Telephone Encounter (Signed)
It would not.  And if her renal disease is active enough to require labs every 3 months then I will need to be seeing her that often.  In my opinion, however, she doesn't need labs that often.

## 2011-12-09 NOTE — Telephone Encounter (Signed)
Message was relayed to patient and she decided she would wait until September and then she and Dr. Louanne Belton can discuss the need to be seen more often at that appt.

## 2011-12-10 ENCOUNTER — Telehealth: Payer: Self-pay | Admitting: *Deleted

## 2011-12-10 NOTE — Telephone Encounter (Signed)
Pt is scheduled for appt with Dr. Danielle Dess on 01/07/12 at 2:30pm.  Pt notified of appt by Thayer Ohm at Dr. Verlee Rossetti office.

## 2011-12-11 ENCOUNTER — Emergency Department (HOSPITAL_COMMUNITY)
Admission: EM | Admit: 2011-12-11 | Discharge: 2011-12-11 | Disposition: A | Discharge status: Home / Self Care | Payer: Medicare Other | Attending: Emergency Medicine | Admitting: Emergency Medicine

## 2011-12-11 ENCOUNTER — Emergency Department (HOSPITAL_COMMUNITY): Payer: Medicare Other

## 2011-12-11 ENCOUNTER — Encounter (HOSPITAL_COMMUNITY): Payer: Self-pay

## 2011-12-11 ENCOUNTER — Other Ambulatory Visit: Payer: Self-pay

## 2011-12-11 DIAGNOSIS — I129 Hypertensive chronic kidney disease with stage 1 through stage 4 chronic kidney disease, or unspecified chronic kidney disease: Secondary | ICD-10-CM | POA: Diagnosis not present

## 2011-12-11 DIAGNOSIS — E785 Hyperlipidemia, unspecified: Secondary | ICD-10-CM | POA: Diagnosis not present

## 2011-12-11 DIAGNOSIS — R0602 Shortness of breath: Secondary | ICD-10-CM

## 2011-12-11 DIAGNOSIS — R0989 Other specified symptoms and signs involving the circulatory and respiratory systems: Secondary | ICD-10-CM | POA: Diagnosis not present

## 2011-12-11 DIAGNOSIS — R0601 Orthopnea: Secondary | ICD-10-CM | POA: Diagnosis not present

## 2011-12-11 DIAGNOSIS — I10 Essential (primary) hypertension: Secondary | ICD-10-CM | POA: Diagnosis not present

## 2011-12-11 DIAGNOSIS — Z79899 Other long term (current) drug therapy: Secondary | ICD-10-CM | POA: Diagnosis not present

## 2011-12-11 DIAGNOSIS — Z7982 Long term (current) use of aspirin: Secondary | ICD-10-CM | POA: Insufficient documentation

## 2011-12-11 DIAGNOSIS — N189 Chronic kidney disease, unspecified: Secondary | ICD-10-CM | POA: Insufficient documentation

## 2011-12-11 HISTORY — DX: Other specified anemias: D64.89

## 2011-12-11 LAB — BASIC METABOLIC PANEL
BUN: 47 mg/dL — ABNORMAL HIGH (ref 6–23)
CO2: 20 mEq/L (ref 19–32)
Chloride: 109 mEq/L (ref 96–112)
GFR calc Af Amer: 9 mL/min — ABNORMAL LOW (ref >90)
Potassium: 4.4 mEq/L (ref 3.5–5.1)

## 2011-12-11 LAB — CBC WITH DIFFERENTIAL/PLATELET
Basophils Absolute: 0 10*3/uL (ref 0.0–0.1)
Basophils Relative: 0 % (ref 0–1)
HCT: 32.7 % — ABNORMAL LOW (ref 36.0–46.0)
Hemoglobin: 10.3 g/dL — ABNORMAL LOW (ref 12.0–15.0)
Lymphocytes Relative: 27 % (ref 12–46)
MCHC: 31.5 g/dL (ref 30.0–36.0)
Monocytes Relative: 10 % (ref 3–12)
Neutro Abs: 3.9 10*3/uL (ref 1.7–7.7)
Neutrophils Relative %: 57 % (ref 43–77)
WBC: 6.8 10*3/uL (ref 4.0–10.5)

## 2011-12-11 LAB — PRO B NATRIURETIC PEPTIDE: Pro B Natriuretic peptide (BNP): 2026 pg/mL — ABNORMAL HIGH (ref 0–125)

## 2011-12-11 LAB — POCT I-STAT TROPONIN I: Troponin i, poc: 0.02 ng/mL (ref 0.00–0.08)

## 2011-12-11 LAB — URINALYSIS, ROUTINE W REFLEX MICROSCOPIC
Glucose, UA: NEGATIVE mg/dL
Leukocytes, UA: NEGATIVE
Nitrite: NEGATIVE
Specific Gravity, Urine: 1.013 (ref 1.005–1.030)
pH: 5 (ref 5.0–8.0)

## 2011-12-11 MED ORDER — MOXIFLOXACIN HCL IN NACL 400 MG/250ML IV SOLN
400.0000 mg | Freq: Once | INTRAVENOUS | Status: AC
  Administered 2011-12-11: 400 mg via INTRAVENOUS
  Filled 2011-12-11: qty 250

## 2011-12-11 NOTE — ED Notes (Signed)
Pt requested to have IV removed despite avelox infusion not complete.

## 2011-12-11 NOTE — ED Notes (Signed)
Pt declines IV until it is necessary

## 2011-12-11 NOTE — ED Notes (Signed)
Report received.  Pt lying on stretcher with visitor at bedside.  Pt is in no distress, reports shortness of breath is returning.  Pt also reports a "burning" sensation in her throat.  Pt is GCS 15.  Pt c/o being chilled, warm blankets provided.

## 2011-12-11 NOTE — ED Notes (Signed)
EMS reports pt woke out of sleep with SOB, better with sitting up, denies pain, CTA,

## 2011-12-11 NOTE — ED Provider Notes (Signed)
History     CSN: [id number]  Arrival date & time 12/11/11  0607   First MD Initiated Contact with Patient 12/11/11 [phone]      Chief Complaint  Patient presents with  . Shortness of Breath    (Consider location/radiation/quality/duration/timing/severity/associated sxs/prior treatment) HPI  Past Medical History  Diagnosis Date  . Hyperlipidemia   . Hypertension   . Chronic kidney disease   . Normal cardiac stress test 12/24/2009    lexiscan, imaging normal  . ANEMIA NEC 03/31/2007    Qualifier: Diagnosis of  By: Gavin Potters MD, [name]      Past Surgical History  Procedure Date  . Cardiac catheterization 2003    normal  . Cholecystectomy     Open mid line incision  . Frontal craniotomy 2002    indication = sinusitis  . Appendectomy   . Tubal ligation   . Abdominal hysterectomy     History reviewed. No pertinent family history.  History  Substance Use Topics  . Smoking status: Current Some Day Smoker -- 1.0 packs/day for 60 years    Types: Cigarettes  . Smokeless tobacco: Not on file  . Alcohol Use: No    OB History    Grav Para Term Preterm Abortions TAB SAB Ect Mult Living   5 3 3              Review of Systems  Allergies  Penicillins; Pregabalin; and Sulfamethoxazole  Home Medications   Current Outpatient Rx  Name Route Sig Dispense Refill  . ALLOPURINOL 100 MG PO TABS Oral Take 1 tablet (100 mg total) by mouth daily. 30 tablet 11  . AMITRIPTYLINE HCL 10 MG PO TABS  Take one half or one tablet by mouth at 7 pm for hip pain / restless legs 30 tablet 1  . AMLODIPINE BESYLATE 10 MG PO TABS Oral Take 1 tablet (10 mg total) by mouth daily. 30 tablet 11  . ASPIRIN 81 MG PO CHEW Oral Chew 81 mg by mouth daily.      . ATORVASTATIN CALCIUM 40 MG PO TABS Oral Take 1 tablet (40 mg total) by mouth daily. 30 tablet 11  . CLONIDINE HCL 0.1 MG PO TABS Oral Take 1 tablet (0.1 mg total) by mouth 3 (three) times daily. 90 tablet 11  . HYDRALAZINE HCL 50 MG PO TABS Oral  Take 1 tablet (50 mg total) by mouth 3 (three) times daily. 90 tablet 11  . HYDROCODONE-ACETAMINOPHEN 5-500 MG PO TABS  Take one half or one tablet twice a day for back pain 30 tablet 0  . HYDROCORTISONE 2.5 % EX OINT Topical Apply topically 2 (two) times daily.      Marland Kitchen METOPROLOL TARTRATE 100 MG PO TABS  TAKE ONE TABLET BY MOUTH TWICE DAILY 60 tablet 11  . OMEPRAZOLE 20 MG PO TBEC Oral Take 1 tablet (20 mg total) by mouth daily. 30 each 11    BP 159/81  Pulse 87  Temp 97.8 F (36.6 C) (Oral)  Resp 20  SpO2 100%  Physical Exam  ED Course  Procedures (including critical care time)  Labs Reviewed  CBC WITH DIFFERENTIAL - Abnormal; Notable for the following:    RBC 3.81 (*)     Hemoglobin 10.3 (*)     HCT 32.7 (*)     RDW 16.9 (*)     Eosinophils Relative 6 (*)     All other components within normal limits  BASIC METABOLIC PANEL - Abnormal; Notable for the following:  BUN 47 (*)     Creatinine, Ser 5.06 (*)     Calcium 11.2 (*)     GFR calc non Af Amer 8 (*)     GFR calc Af Amer 9 (*)     All other components within normal limits  URINALYSIS, ROUTINE W REFLEX MICROSCOPIC - Abnormal; Notable for the following:    Protein, ur 100 (*)     All other components within normal limits  PRO B NATRIURETIC PEPTIDE - Abnormal; Notable for the following:    Pro B Natriuretic peptide (BNP) 2026.0 (*)     All other components within normal limits  URINE MICROSCOPIC-ADD ON - Abnormal; Notable for the following:    Casts GRANULAR CAST (*)     All other components within normal limits  POCT I-STAT TROPONIN I   Dg Chest 2 View  12/11/2011  **ADDENDUM** CREATED: 12/11/2011 07:04:44  On comparison with prior studies, mild perihilar opacity appears stable and likely reflects normal vasculature.  Mild chronic peribronchial thickening is noted.  IMPRESSION:  Mild peribronchial thickening noted; lungs otherwise grossly clear.  **END ADDENDUM** SIGNED BY: Tonia Ghent, M.D.   12/11/2011   *RADIOLOGY REPORT*  Clinical Data: Shortness of breath; history of smoking.  CHEST - 2 VIEW  Comparison: Chest radiograph performed 06/12/2010  Findings: The lungs are well-aerated.  Mild right peribronchial opacity could reflect mild pneumonia.  There is no evidence of pleural effusion or pneumothorax.  There is mild elevation of the right hemidiaphragm.  The heart is normal in size; the mediastinal contour is within normal limits.  No acute osseous abnormalities are seen.  IMPRESSION: Mild right peribronchial opacity could reflect mild pneumonia.  Original Report Authenticated By: Tonia Ghent, M.D.     1. SOB (shortness of breath)       MDM  Agree with the evaluation by the midlevel provider. Pt came in with acute SOB. Her symptoms have resolved at my evaluation. Pt has CKD, but no primary cardiac disease or CHF. Pt's VSS, no tachycardia, no tavhypnea, no hypoxia. Exam reveals no JCD, minimal baslar crackles, and no pitting edema, sighns or symptoms of DVT. Pt has no fevers, cough and she denies any worsening orthopnea (1/5 pillows baseline), or PND.  We consulted Family medicine to see if obs admission was appropriate, and after their evaluation they want the patient to go home with a close follow up.  Pt given a  Dose of antibiotics, but i don't see any need to continue antibiotics. There is no fever, cough, CXR shows no infiltrate, and exam is unremarkable. We will not inititiate lasix either. This appears to be slight fluid overload from CKD.   Dispo: CKD, mild pulmonary edema, d/c home with f/u on Monday with Family medicine.        Derwood Kaplan, MD 12/11/11 1015

## 2011-12-11 NOTE — Consult Note (Signed)
Family Medicine Teaching Service Attending Note   I discussed with Dr. Berline Chough and reviewed their note for today.  I agree with their assessment and plan.

## 2011-12-11 NOTE — Consult Note (Signed)
Family Medicine Teaching Service ED Consult Note   Patient name: [name] Medical record number: [id number] Date of birth: [birth year] Age: 71 y.o. Gender: female  Primary Care Provider: Majel Homer, MD   Chief Complaint: Shortness of Breath    HPI  [name] is a 71 y.o. year old female presenting with acute onset of shortness of breath at 5 AM this morning.  She reports waking up from sleep feeling anxious and short of breath.  She had had an episode of urinary incontinence and was slightly distressed about this.    She got up and walk around, and did not have any worsening of her dyspnea did not have any improvement either.  She reports going outside atretic agger breath.  This did not help.  She reported some nasal congestion as well as difficulty obtaining a deep breath and because of this called EMS.  Upon arrival of EMS.  She was found to not be hypoxic but nasal O2 was placed and she said this relieved her shortness of breath.  She was transferred to the emergency department for further evaluation.  In the emergency department initial chest x-ray showed questionable right lower lobe pneumonia.  However upon further inspection prior x-rays it was determined that her x-ray was unchanged from prior.  She did not have any fevers, no leukocytosis, no productive cough.  A BMP was obtained and showed a creatinine of 5.0 which is around her baseline between 4 and 6.  She has already had an AV fistula placed for planning for end-stage renal disease, but has not followed up with her renal doctor lately.  A proBNP was obtained and elevated to 2000 however, this value is unreliable due to the extent of her renal disease.  FMTS was consult for consideration of admission/observation.  ROS   Constitutional  chronic fatigue and chronic pain, including right SI joint pain, as well as bilateral lower extremity pain.  Is currently being worked up by Dr. Jennette Kettle in referral has been made to orthopedics     Infectious  no fevers, no chills,   Resp  no cough some right-sided facial congestion on occasion, but this is insignificant compared to patient's baseline allergic rhinitis.    Cardiac  one episode of questionable PND, prompting this admission, no orthopnea, no dyspnea on exertion, no chest pain  GI  negative   GU  multiple bouts of urinary incontinence while sleeping, frequency with nocturia 6-7 times per night on a regular basis, known bladder prolapse   Skin   Psych  irrigations individual with a history of multiple psychiatric diagnoses, including chronic anxiety, depression, conversion disorder   Neuro   MSK  multiple joint pains   Trauma  none reported, no falls   Activity   Diet   MEDS  reports good compliance.  However, has had issues of compliance in the past and I suspect this again                HISTORY:  PMHx:  Past Medical History  Diagnosis Date  . Hyperlipidemia   . Hypertension   . Chronic kidney disease   . Normal cardiac stress test 12/24/2009    lexiscan, imaging normal  . ANEMIA NEC 03/31/2007    Qualifier: Diagnosis of  By: Gavin Potters MD, [name]     Patient Active Problem List  Diagnosis  . HYPERCHOLESTEROLEMIA  . Gout, unspecified  . ANEMIA NEC  . ANXIETY STATE, UNSPECIFIED  . TOBACCO ABUSE  . DEPRESSIVE  DISORDER, NOS  . HYPERTENSION, BENIGN SYSTEMIC  . GASTROESOPHAGEAL REFLUX, NO ESOPHAGITIS  . HIATAL HERNIA  . CHRONIC KIDNEY DISEASE STAGE IV (SEVERE)  . SECONDARY HYPERPARATHYROIDISM  . BLADDER PROLAPSE  . OSTEOARTHRITIS, GENERALIZED, MULTIPLE JOINTS  . HIP PAIN, BILATERAL  . Dysphagia, unspecified  . FECAL INCONTINENCE  . INCONTINENCE, URGE  . Gastroparesis  . Low back pain radiating to both legs     PSHx: Past Surgical History  Procedure Date  . Cardiac catheterization 2003    normal  . Cholecystectomy     Open mid line incision  . Frontal craniotomy 2002    indication = sinusitis  . Appendectomy   . Tubal ligation   . Abdominal  hysterectomy     Social Hx: History   Social History Narrative   Lives in [place] with Daughter, granddaughters (twins) and mentally challenged niece.Retired Buyer, retail.    Family Hx: History reviewed. No pertinent family history.  Allergies: Allergies  Allergen Reactions  . Penicillins     REACTION: unspecified--NOT problems breathing. Has tolerated omnicef in past without issue  . Pregabalin     REACTION: confusion  . Sulfamethoxazole     REACTION: unspecified    Home Medications: All meds reviewed            OBJECTIVE  Vitals: Patient Vitals for the past 24 hrs:  BP Temp Temp src Pulse Resp SpO2  12/11/11 0850 159/81 mmHg - - 87  - -  12/11/11 0812 158/51 mmHg - - 64  20  100 %  12/11/11 0726 183/59 mmHg - - 60  18  100 %  12/11/11 0616 180/60 mmHg 97.8 F (36.6 C) Oral 60  15  100 %  12/11/11 0607 - - - - - 100 %   Wt Readings from Last 3 Encounters:  10/31/11 146 lb (66.225 kg)  10/15/11 143 lb 11.2 oz (65.182 kg)  10/08/11 144 lb 14.4 oz (65.726 kg)   No intake or output data in the 24 hours ending 12/11/11 0953   PE: GENERAL:  Adult AA female.  Examined in [facility] ED.  In no discomfort; no respiratory distress.   PSYCH: Alert and appropriately interactive; Insight:Fair   H&N: AT/[place], MMM, no scleral icterus, EOMi, no JVD, no carotid bruits THORAX: HEART: RRR, S1/S2 heard, no murmur LUNGS: CTA B, no wheezes, no crackles Abdomen:  Large midline incisional scar present, well-healed.  Diffusely tender, but no rigidity no guarding,, no hepatosplenomegaly, no hepatic jugular reflux, no abdominal mass, no abdominal aortic bruit EXTREMITIES: Moves all 4 extremities spontaneously, warm well perfused, no edema edema, bilateral DP and PT pulses 2+ out of 4.  Femoral artery bruits present  LABS:  [facility] 12/11/11 0704 08/13/11 1154  WBC 6.8 7.4  HGB 10.3* 11.1*  HCT 32.7* 35.7*  PLT 228 251    Basename 12/11/11 0704 08/13/11 1154  NA 140 139  K 4.4 4.0    CL 109 112  CO2 20 15*  BUN 47* 40*  CREATININE 5.06* 4.36*  CALCIUM 11.2* 10.1  GLUCOSE 94 88   Hematology:  [facility] 12/11/11 0704  LYMPHSABS 1.8  MONOABS 0.7  EOSABS 0.4  BASOSABS 0.0    Basename 12/11/11 0704 08/13/11 1154  RDW 16.9* 16.3*  MCV 85.8 87.5  MCHC 31.5 31.1  MRET -- --   LABS: ProBNP - 2026 Troponin I - 0.02   Urinalysis  Basename 12/11/11 0830  COLORURINE YELLOW  APPEARANCEUR CLEAR  LABSPEC 1.013  PHURINE 5.0  GLUCOSEU NEGATIVE  HGBUR NEGATIVE  BILIRUBINUR NEGATIVE  KETONESUR NEGATIVE  PROTEINUR 100*  UROBILINOGEN 0.2  NITRITE NEGATIVE  LEUKOCYTESUR NEGATIVE    MICRO: Results for orders placed during the hospital encounter of 05/04/10  URINE CULTURE     Status: Normal   Collection Time   05/05/10 12:13 AM      Component Value Range Status Comment   Specimen Description URINE, RANDOM   Final    Special Requests IMMUNE:NORM UT SYMPT:NEG   Final    Culture  Setup Time [id number]   Final    Colony Count 7,000 COLONIES/ML   Final    Culture INSIGNIFICANT GROWTH   Final    Report Status 05/06/2010 FINAL   Final     IMAGING: Dg Chest 2 View  12/11/2011  **ADDENDUM** CREATED: 12/11/2011 07:04:44  On comparison with prior studies, mild perihilar opacity appears stable and likely reflects normal vasculature.  Mild chronic peribronchial thickening is noted.  IMPRESSION:  Mild peribronchial thickening noted; lungs otherwise grossly clear.  **END ADDENDUM** SIGNED BY: Tonia Ghent, M.D.   12/11/2011  *RADIOLOGY REPORT*  Clinical Data: Shortness of breath; history of smoking.  CHEST - 2 VIEW  Comparison: Chest radiograph performed 06/12/2010  Findings: The lungs are well-aerated.  Mild right peribronchial opacity could reflect mild pneumonia.  There is no evidence of pleural effusion or pneumothorax.  There is mild elevation of the right hemidiaphragm.  The heart is normal in size; the mediastinal contour is within normal limits.  No acute osseous  abnormalities are seen.  IMPRESSION: Mild right peribronchial opacity could reflect mild pneumonia.  Original Report Authenticated By: Tonia Ghent, M.D.             Assessment & Plan  71 y.o. 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  That has completely resolved with no intervention.  She was found to have an elevated BNP however , this value is not reliable given her extent of kidney disease.  She has no evidence of volume overload on exam and does not have any evidence of a new pulmonary infiltrate or infectious process at this time.  1. Shortness of breath: Now resolved.  Question the etiology of this however, does not appear to be due to cardiac causes.  Last echo done in 2011 revealed an EF of 55-60%.  She's not had any known coronary disease and low likelihood that she has significant change in the interim.  Results.  No evidence of a overload.  On exam.  Suspect as etiology is due to anxiety and associated with episode of enuresis.   2. CKD STAGE IV to V: (Patient has previously been evaluated by renal however, has been lost to followup due to patient noncompliance.  She does have an AV fistula RE in place and wanted to renal as an outpatient.  She has no evidence of volume overload.  On exam, her potassium is normal and no signs of uremia.   3. Chronic medical conditions: (Hypertension, bladder prolapse and urge/overflow incontinence, hyperlipidemia, tobacco abuse, depression, anxiety) - Currently stable with elevated blood pressures however patient reports a long-standing history of poor compliance and control.  Will be followed as an outpatient and scheduled followup appointment with Dr. Delbert Harness.             DISPOSITION  Discharge to home with close followup.    Will need to be reestablished with renal doctors.   Followup issues include:  respiratory status  Enuresis  Hypertension   Andrena Mews, DO [facility]  [facility] Resident - PGY-2 12/11/2011 10:03 AM

## 2011-12-11 NOTE — ED Notes (Signed)
Patient transported to X-ray 

## 2011-12-11 NOTE — ED Provider Notes (Signed)
History     CSN: [id number]  Arrival date & time 12/11/11  0607   None     6:26 AM HPI Patient reports approximately one hour ago woke up suddenly gasping for breath. Reports she was angled approximately 45 and could not seem to breath well. Reports she Ran around the house opening windows to improve her breathing. Reports significantly improved symptoms. States currently she is asymptomatic. Denies chest pain, fever, cough, back pain, abdominal pain, nausea, vomiting, numbness, weakness, tingling. Denies history of CHF, MI, COPD, smoking, or asthma.  Patient is a 71 y.o. female presenting with shortness of breath.  Shortness of Breath  The current episode started today. The onset was sudden. The problem occurs continuously. The problem has been resolved. The problem is moderate. Relieved by: sitting upright and opening windows. Associated symptoms include orthopnea and shortness of breath. Pertinent negatives include no chest pain, no fever, no rhinorrhea, no cough and no wheezing. Her past medical history does not include asthma.    Past Medical History  Diagnosis Date  . Hyperlipidemia   . Hypertension   . Chronic kidney disease   . Normal cardiac stress test 12/24/2009    lexiscan, imaging normal    Past Surgical History  Procedure Date  . Cardiac catheterization 2003    normal    History reviewed. No pertinent family history.  History  Substance Use Topics  . Smoking status: Current Some Day Smoker  . Smokeless tobacco: Not on file  . Alcohol Use: No    OB History    Grav Para Term Preterm Abortions TAB SAB Ect Mult Living                  Review of Systems  Constitutional: Negative for fever, diaphoresis and fatigue.  HENT: Negative for rhinorrhea and neck pain.   Respiratory: Positive for shortness of breath. Negative for cough and wheezing.   Cardiovascular: Positive for orthopnea. Negative for chest pain and leg swelling.  Gastrointestinal: Negative for  nausea, vomiting and abdominal pain.  Genitourinary: Negative for dysuria, urgency and hematuria.  Neurological: Negative for dizziness, weakness, numbness and headaches.  All other systems reviewed and are negative.    Allergies  Codeine phosphate; Penicillins; Pregabalin; and Sulfamethoxazole  Home Medications   Current Outpatient Rx  Name Route Sig Dispense Refill  . ALLOPURINOL 100 MG PO TABS Oral Take 1 tablet (100 mg total) by mouth daily. 30 tablet 11  . AMITRIPTYLINE HCL 10 MG PO TABS  Take one half or one tablet by mouth at 7 pm for hip pain / restless legs 30 tablet 1  . AMLODIPINE BESYLATE 10 MG PO TABS Oral Take 1 tablet (10 mg total) by mouth daily. 30 tablet 11  . ASPIRIN 81 MG PO CHEW Oral Chew 81 mg by mouth daily.      . ATORVASTATIN CALCIUM 40 MG PO TABS Oral Take 1 tablet (40 mg total) by mouth daily. 30 tablet 11  . CLONIDINE HCL 0.1 MG PO TABS Oral Take 1 tablet (0.1 mg total) by mouth 3 (three) times daily. 90 tablet 11  . FLUTICASONE PROPIONATE 50 MCG/ACT NA SUSP Nasal Place 1 spray into the nose daily. 16 g 2  . HYDRALAZINE HCL 50 MG PO TABS Oral Take 1 tablet (50 mg total) by mouth 3 (three) times daily. 90 tablet 11  . HYDROCODONE-ACETAMINOPHEN 5-500 MG PO TABS  Take one half or one tablet twice a day for back pain 30 tablet 0  .  HYDROCORTISONE 2.5 % EX OINT Topical Apply topically 2 (two) times daily.      Marland Kitchen LORATADINE 10 MG PO TABS Oral Take 1 tablet (10 mg total) by mouth daily as needed for allergies. 30 tablet 11  . METOPROLOL TARTRATE 100 MG PO TABS  TAKE ONE TABLET BY MOUTH TWICE DAILY 60 tablet 11  . NITROGLYCERIN 0.4 MG SL SUBL Sublingual Place 0.4 mg under the tongue every 5 (five) minutes as needed. Take 1 pill every 5 minutes x 3 for chest pain. If you have to take this please call my office, if no relief call 911     . OMEPRAZOLE 20 MG PO TBEC Oral Take 1 tablet (20 mg total) by mouth daily. 30 each 11  . PREDNISONE 20 MG PO TABS Oral Take 20 mg by  mouth daily. Take 2 tabs by mouth for 3 days then take one tab by mouth for 3 day then 1/2 tab by mouth for 4 days then stop     . PROMETHAZINE HCL 12.5 MG PO TABS Oral Take 12.5 mg by mouth every 6 (six) hours as needed. Take 1-2 tablets to treat nausea     . SERTRALINE HCL 100 MG PO TABS Oral Take 1 tablet (100 mg total) by mouth daily. 90 tablet 3    SpO2 100%  Physical Exam  Vitals reviewed. Constitutional: She is oriented to person, place, and time. Vital signs are normal. She appears well-developed and well-nourished.  HENT:  Head: Normocephalic and atraumatic.  Eyes: Conjunctivae are normal. Pupils are equal, round, and reactive to light. Right eye exhibits no discharge. Left eye exhibits no discharge.  Neck: Normal range of motion. Neck supple.  Cardiovascular: Normal rate, regular rhythm and normal heart sounds.  Exam reveals no friction rub.   No murmur heard. Pulmonary/Chest: Effort normal. She has no wheezes. She has no rhonchi. She has rales in the right lower field. She exhibits no tenderness.  Musculoskeletal: Normal range of motion.  Neurological: She is alert and oriented to person, place, and time.  Skin: Skin is warm and dry. No rash noted. No erythema. No pallor.    ED Course  Procedures   ED ECG REPORT   Date: 12/11/2011  EKG Time: 6:45 AM  Rate: 58  Rhythm: Sinus Rhythm  Axis: normal   Intervals:none  ST&T Change: Nonspecific T wave changes   Narrative Interpretation: no significant changes since 12/11/2006    Results for orders placed during the hospital encounter of 12/11/11  CBC WITH DIFFERENTIAL      Component Value Range   WBC 6.8  4.0 - 10.5 K/uL   RBC 3.81 (*) 3.87 - 5.11 MIL/uL   Hemoglobin 10.3 (*) 12.0 - 15.0 g/dL   HCT 47.8 (*) 29.5 - 62.1 %   MCV 85.8  78.0 - 100.0 fL   MCH 27.0  26.0 - 34.0 pg   MCHC 31.5  30.0 - 36.0 g/dL   RDW 30.8 (*) 65.7 - 84.6 %   Platelets 228  150 - 400 K/uL   Neutrophils Relative 57  43 - 77 %   Neutro Abs  3.9  1.7 - 7.7 K/uL   Lymphocytes Relative 27  12 - 46 %   Lymphs Abs 1.8  0.7 - 4.0 K/uL   Monocytes Relative 10  3 - 12 %   Monocytes Absolute 0.7  0.1 - 1.0 K/uL   Eosinophils Relative 6 (*) 0 - 5 %   Eosinophils Absolute 0.4  0.0 -  0.7 K/uL   Basophils Relative 0  0 - 1 %   Basophils Absolute 0.0  0.0 - 0.1 K/uL  BASIC METABOLIC PANEL      Component Value Range   Sodium 140  135 - 145 mEq/L   Potassium 4.4  3.5 - 5.1 mEq/L   Chloride 109  96 - 112 mEq/L   CO2 20  19 - 32 mEq/L   Glucose, Bld 94  70 - 99 mg/dL   BUN 47 (*) 6 - 23 mg/dL   Creatinine, Ser 1.61 (*) 0.50 - 1.10 mg/dL   Calcium 09.6 (*) 8.4 - 10.5 mg/dL   GFR calc non Af Amer 8 (*) >90 mL/min   GFR calc Af Amer 9 (*) >90 mL/min  PRO B NATRIURETIC PEPTIDE      Component Value Range   Pro B Natriuretic peptide (BNP) 2026.0 (*) 0 - 125 pg/mL  POCT I-STAT TROPONIN I      Component Value Range   Troponin i, poc 0.02  0.00 - 0.08 ng/mL   Comment 3            Dg Chest 2 View  12/11/2011  **ADDENDUM** CREATED: 12/11/2011 07:04:44  On comparison with prior studies, mild perihilar opacity appears stable and likely reflects normal vasculature.  Mild chronic peribronchial thickening is noted.  IMPRESSION:  Mild peribronchial thickening noted; lungs otherwise grossly clear.  **END ADDENDUM** SIGNED BY: Tonia Ghent, M.D.   12/11/2011  *RADIOLOGY REPORT*  Clinical Data: Shortness of breath; history of smoking.  CHEST - 2 VIEW  Comparison: Chest radiograph performed 06/12/2010  Findings: The lungs are well-aerated.  Mild right peribronchial opacity could reflect mild pneumonia.  There is no evidence of pleural effusion or pneumothorax.  There is mild elevation of the right hemidiaphragm.  The heart is normal in size; the mediastinal contour is within normal limits.  No acute osseous abnormalities are seen.  IMPRESSION: Mild right peribronchial opacity could reflect mild pneumonia.  Original Report Authenticated By: Tonia Ghent, M.D.         MDM   Spoke with [facility], Will come admit patient for observation. Discussed plan with Patient, family and Dr Rhunette Croft.    9:43 AM Has been seen by [facility] They feel patient may be discharged safely home. Will provide close follow-up in the office. Xray re-read as negative for pneumonia.      Thomasene Lot, PA-C 12/11/11 [phone]

## 2011-12-15 ENCOUNTER — Other Ambulatory Visit: Payer: Self-pay | Admitting: *Deleted

## 2011-12-15 ENCOUNTER — Ambulatory Visit: Payer: Medicare Other | Admitting: Family Medicine

## 2011-12-15 ENCOUNTER — Other Ambulatory Visit: Payer: Self-pay | Admitting: Family Medicine

## 2011-12-15 MED ORDER — HYDROCODONE-ACETAMINOPHEN 5-500 MG PO TABS: ORAL_TABLET | ORAL | Status: DC

## 2011-12-24 ENCOUNTER — Ambulatory Visit: Payer: Medicare Other | Admitting: Family Medicine

## 2012-01-07 DIAGNOSIS — M47812 Spondylosis without myelopathy or radiculopathy, cervical region: Secondary | ICD-10-CM | POA: Diagnosis not present

## 2012-01-07 DIAGNOSIS — M545 Low back pain: Secondary | ICD-10-CM | POA: Diagnosis not present

## 2012-02-06 ENCOUNTER — Ambulatory Visit (INDEPENDENT_AMBULATORY_CARE_PROVIDER_SITE_OTHER): Payer: Medicare Other | Admitting: Family Medicine

## 2012-02-06 ENCOUNTER — Encounter: Payer: Self-pay | Admitting: Family Medicine

## 2012-02-06 VITALS — BP 172/76 | HR 65

## 2012-02-06 DIAGNOSIS — M545 Low back pain, unspecified: Secondary | ICD-10-CM | POA: Diagnosis not present

## 2012-02-06 DIAGNOSIS — M25559 Pain in unspecified hip: Secondary | ICD-10-CM

## 2012-02-09 NOTE — Progress Notes (Signed)
Patient ID: [name], female   DOB: 1940/06/17, 71 y.o.   MRN: [id number] Here for discussion regarding her recent evaluation for back and hip pain. The surgeon recommended she consider Epidurals injections. She is here for discussion of that. Essentially the surgeon did not think her spinal stenosis had a surgical option and he was unclear that that was  causing her pain. He also evaluation of her hips. We discussed this for greater than 50% of her office visit lasting 25 minutes. She also has concerns that perhaps her chronic constipation is causing some compression on the nerves. I think that could certainly be a contributing factor and have asked her if she would like to see a nutritionist. Evidently she his not been eating much for the last several months, she says she's essentially been drinking diet Coke and he had spoonful her to [place] today. I'll try to get her set up with her nutritionist. She will consider whether not she wants to pursue the epidural steroids. 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 She does not want to go through that again. I doubt that the corticosteroid injection into the epidural space would have that is the likely risk but I understand her thought process. She'll let me know what she wants to do. I will  get her set up with nutritionist.

## 2012-02-13 ENCOUNTER — Telehealth: Payer: Self-pay | Admitting: *Deleted

## 2012-02-13 DIAGNOSIS — E7800 Pure hypercholesterolemia, unspecified: Secondary | ICD-10-CM

## 2012-02-13 NOTE — Telephone Encounter (Signed)
Amy plz tell her ZI have thought about it as well and I think she is making right choice. Plz also tell her that Dr Gerilyn Pilgrim is out for next week or so as her Mom is very ill---I will stillssend the nutrition consukltin as I think it will over all be beneficial for her---but it may be a few weeks THEN, can u call the [facility]---she saw Dr Danielle Dess and he is one who rec the epidural steroid shots---see if they want to do them at their office or if we need to set them up at [facility] imaging or where [facility]! Denny Levy

## 2012-02-13 NOTE — Telephone Encounter (Signed)
Pt called to let Dr. Jennette Kettle know she is ready to proceed with epidural injections.    Also - pt states she has not heard from anyone about nutrition appt.  Put order for this in and sent message to jeannie sykes who will call pt to schedule appt.

## 2012-02-16 NOTE — Telephone Encounter (Signed)
Called Dr. Verlee Rossetti office, they will do procedure in their pain management department.  Pt will be contacted by their scheduler to set this up.  Advised pt they will be calling her.

## 2012-02-19 DIAGNOSIS — D649 Anemia, unspecified: Secondary | ICD-10-CM | POA: Diagnosis not present

## 2012-02-19 DIAGNOSIS — N2581 Secondary hyperparathyroidism of renal origin: Secondary | ICD-10-CM | POA: Diagnosis not present

## 2012-02-19 DIAGNOSIS — M109 Gout, unspecified: Secondary | ICD-10-CM | POA: Diagnosis not present

## 2012-02-19 DIAGNOSIS — Z940 Kidney transplant status: Secondary | ICD-10-CM | POA: Diagnosis not present

## 2012-02-27 DIAGNOSIS — M47817 Spondylosis without myelopathy or radiculopathy, lumbosacral region: Secondary | ICD-10-CM | POA: Diagnosis not present

## 2012-02-27 DIAGNOSIS — M48061 Spinal stenosis, lumbar region without neurogenic claudication: Secondary | ICD-10-CM | POA: Diagnosis not present

## 2012-03-01 ENCOUNTER — Encounter (HOSPITAL_COMMUNITY): Payer: Self-pay | Admitting: *Deleted

## 2012-03-01 ENCOUNTER — Telehealth: Payer: Self-pay | Admitting: Family Medicine

## 2012-03-01 DIAGNOSIS — G9389 Other specified disorders of brain: Secondary | ICD-10-CM | POA: Diagnosis not present

## 2012-03-01 DIAGNOSIS — N2581 Secondary hyperparathyroidism of renal origin: Secondary | ICD-10-CM | POA: Diagnosis present

## 2012-03-01 DIAGNOSIS — N19 Unspecified kidney failure: Secondary | ICD-10-CM | POA: Diagnosis not present

## 2012-03-01 DIAGNOSIS — R4182 Altered mental status, unspecified: Secondary | ICD-10-CM | POA: Diagnosis not present

## 2012-03-01 DIAGNOSIS — F19951 Other psychoactive substance use, unspecified with psychoactive substance-induced psychotic disorder with hallucinations: Principal | ICD-10-CM | POA: Diagnosis present

## 2012-03-01 DIAGNOSIS — E872 Acidosis: Secondary | ICD-10-CM | POA: Diagnosis not present

## 2012-03-01 DIAGNOSIS — M109 Gout, unspecified: Secondary | ICD-10-CM | POA: Diagnosis present

## 2012-03-01 DIAGNOSIS — F3289 Other specified depressive episodes: Secondary | ICD-10-CM | POA: Diagnosis present

## 2012-03-01 DIAGNOSIS — E7800 Pure hypercholesterolemia, unspecified: Secondary | ICD-10-CM | POA: Diagnosis present

## 2012-03-01 DIAGNOSIS — Z7982 Long term (current) use of aspirin: Secondary | ICD-10-CM

## 2012-03-01 DIAGNOSIS — I10 Essential (primary) hypertension: Secondary | ICD-10-CM | POA: Diagnosis not present

## 2012-03-01 DIAGNOSIS — T40605A Adverse effect of unspecified narcotics, initial encounter: Secondary | ICD-10-CM | POA: Diagnosis present

## 2012-03-01 DIAGNOSIS — I129 Hypertensive chronic kidney disease with stage 1 through stage 4 chronic kidney disease, or unspecified chronic kidney disease: Secondary | ICD-10-CM | POA: Diagnosis present

## 2012-03-01 DIAGNOSIS — F411 Generalized anxiety disorder: Secondary | ICD-10-CM | POA: Diagnosis present

## 2012-03-01 DIAGNOSIS — R443 Hallucinations, unspecified: Secondary | ICD-10-CM | POA: Diagnosis not present

## 2012-03-01 DIAGNOSIS — Z79899 Other long term (current) drug therapy: Secondary | ICD-10-CM

## 2012-03-01 DIAGNOSIS — F17200 Nicotine dependence, unspecified, uncomplicated: Secondary | ICD-10-CM | POA: Diagnosis present

## 2012-03-01 DIAGNOSIS — N184 Chronic kidney disease, stage 4 (severe): Secondary | ICD-10-CM | POA: Diagnosis present

## 2012-03-01 DIAGNOSIS — E785 Hyperlipidemia, unspecified: Secondary | ICD-10-CM | POA: Diagnosis present

## 2012-03-01 DIAGNOSIS — F329 Major depressive disorder, single episode, unspecified: Secondary | ICD-10-CM | POA: Diagnosis present

## 2012-03-01 DIAGNOSIS — D649 Anemia, unspecified: Secondary | ICD-10-CM | POA: Diagnosis present

## 2012-03-01 DIAGNOSIS — M159 Polyosteoarthritis, unspecified: Secondary | ICD-10-CM | POA: Diagnosis present

## 2012-03-01 LAB — CBC WITH DIFFERENTIAL/PLATELET
Eosinophils Absolute: 0 10*3/uL (ref 0.0–0.7)
Eosinophils Relative: 0 % (ref 0–5)
HCT: 32.3 % — ABNORMAL LOW (ref 36.0–46.0)
Hemoglobin: 10.4 g/dL — ABNORMAL LOW (ref 12.0–15.0)
Lymphs Abs: 0.7 10*3/uL (ref 0.7–4.0)
MCH: 27.5 pg (ref 26.0–34.0)
MCV: 85.4 fL (ref 78.0–100.0)
Monocytes Absolute: 0.7 10*3/uL (ref 0.1–1.0)
Monocytes Relative: 7 % (ref 3–12)
Neutrophils Relative %: 87 % — ABNORMAL HIGH (ref 43–77)
RBC: 3.78 MIL/uL — ABNORMAL LOW (ref 3.87–5.11)

## 2012-03-01 NOTE — ED Notes (Signed)
Pt's daughter noticed late Saturday that pt was not sleeping and seeing ticks on her knees that were not there.  Pt appears VERY agitated and swears there are urchins on her back.  Per daughter, pt had a cortisone shot on Fri and has never had one before.

## 2012-03-01 NOTE — Telephone Encounter (Signed)
Daughter called.  Since this morning mother has been halucinating with uncontrolled behavior.  Feels that bugs are crawling on her and is not able to be redirected.  Recommended immediately come to ED for evaluation.  No known substance use at this time.

## 2012-03-02 ENCOUNTER — Encounter (HOSPITAL_COMMUNITY): Payer: Self-pay | Admitting: General Practice

## 2012-03-02 ENCOUNTER — Emergency Department (HOSPITAL_COMMUNITY): Payer: Medicare Other

## 2012-03-02 ENCOUNTER — Inpatient Hospital Stay (HOSPITAL_COMMUNITY)
Admission: EM | Admit: 2012-03-02 | Discharge: 2012-03-03 | DRG: 897 | Disposition: A | Discharge status: Home / Self Care | Payer: Medicare Other | Attending: Family Medicine | Admitting: Family Medicine

## 2012-03-02 DIAGNOSIS — I10 Essential (primary) hypertension: Secondary | ICD-10-CM

## 2012-03-02 DIAGNOSIS — R443 Hallucinations, unspecified: Secondary | ICD-10-CM | POA: Diagnosis present

## 2012-03-02 DIAGNOSIS — E872 Acidosis: Secondary | ICD-10-CM

## 2012-03-02 DIAGNOSIS — R4182 Altered mental status, unspecified: Secondary | ICD-10-CM | POA: Diagnosis not present

## 2012-03-02 DIAGNOSIS — F05 Delirium due to known physiological condition: Secondary | ICD-10-CM

## 2012-03-02 DIAGNOSIS — G9389 Other specified disorders of brain: Secondary | ICD-10-CM | POA: Diagnosis not present

## 2012-03-02 DIAGNOSIS — N19 Unspecified kidney failure: Secondary | ICD-10-CM

## 2012-03-02 DIAGNOSIS — F19939 Other psychoactive substance use, unspecified with withdrawal, unspecified: Secondary | ICD-10-CM

## 2012-03-02 HISTORY — DX: Disorder of thyroid, unspecified: E07.9

## 2012-03-02 HISTORY — DX: Irritable bowel syndrome, unspecified: K58.9

## 2012-03-02 HISTORY — DX: Diverticulitis of intestine, part unspecified, without perforation or abscess without bleeding: K57.92

## 2012-03-02 LAB — RENAL FUNCTION PANEL
Albumin: 2.8 g/dL — ABNORMAL LOW (ref 3.5–5.2)
Calcium: 10.7 mg/dL — ABNORMAL HIGH (ref 8.4–10.5)
GFR calc Af Amer: 9 mL/min — ABNORMAL LOW (ref >90)
GFR calc non Af Amer: 7 mL/min — ABNORMAL LOW (ref >90)
Phosphorus: 5.2 mg/dL — ABNORMAL HIGH (ref 2.3–4.6)
Potassium: 3.9 mEq/L (ref 3.5–5.1)
Sodium: 136 mEq/L (ref 135–145)

## 2012-03-02 LAB — URINALYSIS, ROUTINE W REFLEX MICROSCOPIC
Ketones, ur: NEGATIVE mg/dL
Leukocytes, UA: NEGATIVE
Nitrite: NEGATIVE
Protein, ur: 30 mg/dL — AB
pH: 5 (ref 5.0–8.0)

## 2012-03-02 LAB — URINE MICROSCOPIC-ADD ON

## 2012-03-02 LAB — RAPID URINE DRUG SCREEN, HOSP PERFORMED
Barbiturates: NOT DETECTED
Opiates: POSITIVE — AB
Tetrahydrocannabinol: NOT DETECTED

## 2012-03-02 LAB — POCT I-STAT 3, VENOUS BLOOD GAS (G3P V)
Bicarbonate: 15.1 mEq/L — ABNORMAL LOW (ref 20.0–24.0)
O2 Saturation: 80 %
TCO2: 16 mmol/L (ref 0–100)
pCO2, Ven: 36.5 mmHg — ABNORMAL LOW (ref 45.0–50.0)
pO2, Ven: 52 mmHg — ABNORMAL HIGH (ref 30.0–45.0)

## 2012-03-02 LAB — COMPREHENSIVE METABOLIC PANEL
Alkaline Phosphatase: 78 U/L (ref 39–117)
BUN: 62 mg/dL — ABNORMAL HIGH (ref 6–23)
GFR calc Af Amer: 8 mL/min — ABNORMAL LOW (ref >90)
Glucose, Bld: 110 mg/dL — ABNORMAL HIGH (ref 70–99)
Potassium: 4.1 mEq/L (ref 3.5–5.1)
Total Protein: 7.3 g/dL (ref 6.0–8.3)

## 2012-03-02 LAB — AMMONIA: Ammonia: 48 umol/L (ref 11–60)

## 2012-03-02 MED ORDER — SODIUM CHLORIDE 0.9 % IJ SOLN
3.0000 mL | Freq: Two times a day (BID) | INTRAMUSCULAR | Status: DC
  Administered 2012-03-02 – 2012-03-03 (×2): 3 mL via INTRAVENOUS

## 2012-03-02 MED ORDER — SODIUM CHLORIDE 0.9 % IJ SOLN
3.0000 mL | Freq: Two times a day (BID) | INTRAMUSCULAR | Status: DC
  Administered 2012-03-02: 3 mL via INTRAVENOUS

## 2012-03-02 MED ORDER — QUETIAPINE FUMARATE 50 MG PO TABS
50.0000 mg | ORAL_TABLET | Freq: Two times a day (BID) | ORAL | Status: DC
  Administered 2012-03-02 – 2012-03-03 (×3): 50 mg via ORAL
  Filled 2012-03-02 (×4): qty 1

## 2012-03-02 MED ORDER — ATORVASTATIN CALCIUM 40 MG PO TABS
40.0000 mg | ORAL_TABLET | Freq: Every day | ORAL | Status: DC
  Administered 2012-03-02 – 2012-03-03 (×2): 40 mg via ORAL
  Filled 2012-03-02 (×2): qty 1

## 2012-03-02 MED ORDER — ALLOPURINOL 100 MG PO TABS
100.0000 mg | ORAL_TABLET | Freq: Every day | ORAL | Status: DC
  Administered 2012-03-02 – 2012-03-03 (×2): 100 mg via ORAL
  Filled 2012-03-02 (×2): qty 1

## 2012-03-02 MED ORDER — HYDRALAZINE HCL 25 MG PO TABS
25.0000 mg | ORAL_TABLET | ORAL | Status: AC
  Administered 2012-03-02: 25 mg via ORAL
  Filled 2012-03-02: qty 1

## 2012-03-02 MED ORDER — HYDRALAZINE HCL 50 MG PO TABS
50.0000 mg | ORAL_TABLET | Freq: Three times a day (TID) | ORAL | Status: DC
  Filled 2012-03-02 (×3): qty 1

## 2012-03-02 MED ORDER — SODIUM CHLORIDE 0.9 % IV SOLN: 250.0000 mL | INTRAVENOUS | Status: DC | PRN

## 2012-03-02 MED ORDER — QUETIAPINE FUMARATE 50 MG PO TABS
50.0000 mg | ORAL_TABLET | Freq: Two times a day (BID) | ORAL | Status: DC
  Filled 2012-03-02: qty 1

## 2012-03-02 MED ORDER — CLONIDINE HCL 0.1 MG PO TABS
0.1000 mg | ORAL_TABLET | Freq: Three times a day (TID) | ORAL | Status: DC
  Administered 2012-03-02 – 2012-03-03 (×4): 0.1 mg via ORAL
  Filled 2012-03-02 (×6): qty 1

## 2012-03-02 MED ORDER — PANTOPRAZOLE SODIUM 40 MG PO TBEC
40.0000 mg | DELAYED_RELEASE_TABLET | Freq: Every day | ORAL | Status: DC
  Administered 2012-03-02 – 2012-03-03 (×2): 40 mg via ORAL
  Filled 2012-03-02 (×2): qty 1

## 2012-03-02 MED ORDER — INFLUENZA VIRUS VACC SPLIT PF IM SUSP
0.5000 mL | INTRAMUSCULAR | Status: AC
  Administered 2012-03-03: 0.5 mL via INTRAMUSCULAR
  Filled 2012-03-02: qty 0.5

## 2012-03-02 MED ORDER — AMLODIPINE BESYLATE 10 MG PO TABS
10.0000 mg | ORAL_TABLET | Freq: Every day | ORAL | Status: DC
  Administered 2012-03-02 – 2012-03-03 (×2): 10 mg via ORAL
  Filled 2012-03-02 (×2): qty 1

## 2012-03-02 MED ORDER — METOPROLOL TARTRATE 100 MG PO TABS
100.0000 mg | ORAL_TABLET | Freq: Two times a day (BID) | ORAL | Status: DC
  Administered 2012-03-02 – 2012-03-03 (×3): 100 mg via ORAL
  Filled 2012-03-02 (×4): qty 1

## 2012-03-02 MED ORDER — ASPIRIN 81 MG PO CHEW
81.0000 mg | CHEWABLE_TABLET | Freq: Every day | ORAL | Status: DC
  Administered 2012-03-02 – 2012-03-03 (×2): 81 mg via ORAL
  Filled 2012-03-02 (×2): qty 1

## 2012-03-02 MED ORDER — HEPARIN SODIUM (PORCINE) 5000 UNIT/ML IJ SOLN
5000.0000 [IU] | Freq: Three times a day (TID) | INTRAMUSCULAR | Status: DC
  Administered 2012-03-02 – 2012-03-03 (×4): 5000 [IU] via SUBCUTANEOUS
  Filled 2012-03-02 (×7): qty 1

## 2012-03-02 MED ORDER — SODIUM CHLORIDE 0.9 % IJ SOLN: 3.0000 mL | INTRAMUSCULAR | Status: DC | PRN

## 2012-03-02 MED ORDER — CALCITRIOL 0.25 MCG PO CAPS
0.2500 ug | ORAL_CAPSULE | Freq: Every day | ORAL | Status: DC
  Filled 2012-03-02 (×2): qty 1

## 2012-03-02 MED ORDER — QUETIAPINE FUMARATE 25 MG PO TABS
25.0000 mg | ORAL_TABLET | Freq: Every day | ORAL | Status: DC
  Administered 2012-03-02: 25 mg via ORAL
  Filled 2012-03-02: qty 1

## 2012-03-02 NOTE — Discharge Summary (Signed)
Physician Discharge Summary  Patient ID: [name] [id number] 12-09-1940 71 y.o.  Admit date: 03/02/2012 Discharge date: 03/02/2012  PCP: Majel Homer, MD   Discharge Diagnosis: 1. Hallucinations  2. Chronic Kidney Disease, stage 4 3. Hypertension 4. Hyperlipidemia   Discharge Medications  [name], [name]  Home Medication Instructions [id number]   Printed on:03/02/12 1206  Medication Information                    aspirin 81 MG chewable tablet Chew 81 mg by mouth daily.             hydrocortisone 2.5 % ointment Apply topically 2 (two) times daily.             metoprolol (LOPRESSOR) 100 MG tablet TAKE ONE TABLET BY MOUTH TWICE DAILY           amLODipine (NORVASC) 10 MG tablet Take 1 tablet (10 mg total) by mouth daily.           atorvastatin (LIPITOR) 40 MG tablet Take 1 tablet (40 mg total) by mouth daily.           cloNIDine (CATAPRES) 0.1 MG tablet Take 1 tablet (0.1 mg total) by mouth 3 (three) times daily.           hydrALAZINE (APRESOLINE) 50 MG tablet Take 1 tablet (50 mg total) by mouth 3 (three) times daily.           allopurinol (ZYLOPRIM) 100 MG tablet Take 1 tablet (100 mg total) by mouth daily.           Omeprazole (KLS OMPERAZOLE) 20 MG TBEC Take 1 tablet (20 mg total) by mouth daily.           HYDROcodone-acetaminophen (VICODIN) 5-500 MG per tablet Take one half or one tablet twice a day for back pain           calcitRIOL (ROCALTROL) 0.25 MCG capsule Take 2 tablets by mouth Three times a day.           metoCLOPramide (REGLAN) 5 MG tablet Take 1 tablet by mouth daily.           loratadine (CLARITIN) 10 MG tablet Take 10 mg by mouth daily.           fluticasone (FLONASE) 50 MCG/ACT nasal spray Place 2 sprays into the nose daily.              Consults: None  Labs: CBC  Lab 03/01/12 2326  WBC 11.0*  HGB 10.4*  HCT 32.3*  PLT 249   BMET  Lab 03/01/12 2326  NA 133*  K 4.1  CL 104  CO2 14*  BUN 62*  CREATININE 5.56*    CALCIUM 11.0*  PROT 7.3  BILITOT 0.2*  ALKPHOS 78  ALT 7  AST 10  GLUCOSE 110*   Results for orders placed during the hospital encounter of 03/02/12 (from the past 72 hour(s))  URINALYSIS, ROUTINE W REFLEX MICROSCOPIC     Status: Abnormal   Collection Time   03/01/12 12:08 AM      Component Value Range Comment   Color, Urine YELLOW  YELLOW    APPearance CLOUDY (*) CLEAR    Specific Gravity, Urine 1.007  1.005 - 1.030    pH 5.0  5.0 - 8.0    Glucose, UA NEGATIVE  NEGATIVE mg/dL    Hgb urine dipstick NEGATIVE  NEGATIVE    Bilirubin Urine NEGATIVE  NEGATIVE    Ketones, ur  NEGATIVE  NEGATIVE mg/dL    Protein, ur 30 (*) NEGATIVE mg/dL    Urobilinogen, UA 0.2  0.0 - 1.0 mg/dL    Nitrite NEGATIVE  NEGATIVE    Leukocytes, UA NEGATIVE  NEGATIVE   URINE MICROSCOPIC-ADD ON     Status: Abnormal   Collection Time   03/01/12 12:08 AM      Component Value Range Comment   Squamous Epithelial / LPF MANY (*) RARE    WBC, UA 0-2  <3 WBC/hpf    RBC / HPF 0-2  <3 RBC/hpf    Bacteria, UA FEW (*) RARE    Urine-Other RARE YEAST   MUCOUS PRESENT  CBC WITH DIFFERENTIAL     Status: Abnormal   Collection Time   03/01/12 11:26 PM      Component Value Range Comment   WBC 11.0 (*) 4.0 - 10.5 K/uL    RBC 3.78 (*) 3.87 - 5.11 MIL/uL    Hemoglobin 10.4 (*) 12.0 - 15.0 g/dL    HCT 16.1 (*) 09.6 - 46.0 %    MCV 85.4  78.0 - 100.0 fL    MCH 27.5  26.0 - 34.0 pg    MCHC 32.2  30.0 - 36.0 g/dL    RDW 04.5 (*) 40.9 - 15.5 %    Platelets 249  150 - 400 K/uL    Neutrophils Relative 87 (*) 43 - 77 %    Neutro Abs 9.5 (*) 1.7 - 7.7 K/uL    Lymphocytes Relative 7 (*) 12 - 46 %    Lymphs Abs 0.7  0.7 - 4.0 K/uL    Monocytes Relative 7  3 - 12 %    Monocytes Absolute 0.7  0.1 - 1.0 K/uL    Eosinophils Relative 0  0 - 5 %    Eosinophils Absolute 0.0  0.0 - 0.7 K/uL    Basophils Relative 0  0 - 1 %    Basophils Absolute 0.0  0.0 - 0.1 K/uL   COMPREHENSIVE METABOLIC PANEL     Status: Abnormal   Collection  Time   03/01/12 11:26 PM      Component Value Range Comment   Sodium 133 (*) 135 - 145 mEq/L    Potassium 4.1  3.5 - 5.1 mEq/L    Chloride 104  96 - 112 mEq/L    CO2 14 (*) 19 - 32 mEq/L    Glucose, Bld 110 (*) 70 - 99 mg/dL    BUN 62 (*) 6 - 23 mg/dL    Creatinine, Ser 8.11 (*) 0.50 - 1.10 mg/dL    Calcium 91.4 (*) 8.4 - 10.5 mg/dL    Total Protein 7.3  6.0 - 8.3 g/dL    Albumin 3.1 (*) 3.5 - 5.2 g/dL    AST 10  0 - 37 U/L    ALT 7  0 - 35 U/L    Alkaline Phosphatase 78  39 - 117 U/L    Total Bilirubin 0.2 (*) 0.3 - 1.2 mg/dL    GFR calc non Af Amer 7 (*) >90 mL/min    GFR calc Af Amer 8 (*) >90 mL/min   URINE RAPID DRUG SCREEN (HOSP PERFORMED)     Status: Abnormal   Collection Time   03/02/12 12:08 AM      Component Value Range Comment   Opiates POSITIVE (*) NONE DETECTED    Cocaine NONE DETECTED  NONE DETECTED    Benzodiazepines NONE DETECTED  NONE DETECTED    Amphetamines NONE  DETECTED  NONE DETECTED    Tetrahydrocannabinol NONE DETECTED  NONE DETECTED    Barbiturates NONE DETECTED  NONE DETECTED   POCT I-STAT 3, BLOOD GAS (G3P V)     Status: Abnormal   Collection Time   03/02/12  1:39 AM      Component Value Range Comment   pH, Ven 7.226 (*) 7.250 - 7.300    pCO2, Ven 36.5 (*) 45.0 - 50.0 mmHg    pO2, Ven 52.0 (*) 30.0 - 45.0 mmHg    Bicarbonate 15.1 (*) 20.0 - 24.0 mEq/L    TCO2 16  0 - 100 mmol/L    O2 Saturation 80.0      Acid-base deficit 12.0 (*) 0.0 - 2.0 mmol/L    Sample type VENOUS     AMMONIA     Status: Normal   Collection Time   03/02/12  3:54 AM      Component Value Range Comment   Ammonia 48  11 - 60 umol/L     Procedures/Imaging:  Dg Chest 2 View  03/02/2012  *RADIOLOGY REPORT*  Clinical Data: Altered mental status  CHEST - 2 VIEW  Comparison: 12/11/2011  Findings: Mild chronic peribronchial thickening.  No new areas of consolidation.  No pleural effusion or pneumothorax. Cardiomediastinal contours remain within normal limits.  Aortic arch  atherosclerosis noted.  Multilevel degenerative changes.  No acute osseous finding.  IMPRESSION: Mild peribronchial thickening is similar to prior.  No focal consolidation.   Original Report Authenticated By: Waneta Martins, M.D.    Ct Head Wo Contrast  03/02/2012  *RADIOLOGY REPORT*  Clinical Data: Hallucinations  CT HEAD WITHOUT CONTRAST  Technique:  Contiguous axial images were obtained from the base of the skull through the vertex without contrast.  Comparison: 06/08/2010 MRI  Findings: Evidence of prior bifrontal craniotomy.  Left anterior inferior encephalomalacia.There is no evidence for acute hemorrhage, hydrocephalus, mass lesion, or abnormal extra-axial fluid collection.  No definite CT evidence for acute infarction. The visualized paranasal sinuses and mastoid air cells are predominately clear.  IMPRESSION: Postoperative changes of bifrontal craniotomy.  Left anterior inferior encephalomalacia without CT evidence of acute intracranial abnormality.   Original Report Authenticated By: Waneta Martins, M.D.    Brief Hospital Course:  History of Present Illness: (from Admission H&P)  [name] is a 71 y.o. year old female with CKD stage 4 presenting with hallucinations for 1 and a half months. Hallucinations have been primarily tactile in nature, she describes feeling like oysters and worms on her skin "that would get her" if she slept. Her daughter states that it has been much worse over the last two days. Patient states that she feels very agitated and hasn't slept since last week. She feels that this is due to her taking hydrocodone that she started 1.5 months ago and also the epidural steroid injection she received 4 days ago. She hasnt taken any hydrocodone in 2 days but is still having the hallucinations. No other medication changes recently. Denies dysuria, dyspnea, and abdominal pain. She denies SI and HI. She says that the episodes are so intense that she just cant keep herself  calmed down. The patient and her daughter agree that the episodes come and go without obvious exacerbating factors. Positive PMH of anxiety and depression with one admission to a mental health unit but denies any history of hallucinations.   Patient did have one episode during our interview. She became acutely agitated, would not lie still in bed, and  was hitting the bed with both arms. She indicated that she felt things crawling on her and that she felt they were going to eat her.  Hospital Course  ** Hallucinations: It was thought that this was most likely multi-factorial with causes including age, hydrocodone use, recent steroid injection, and sleep derivation. Patient was given Seroquel 25mg  qd on admission which helped with her symptoms the night that she was admitted. However she was still unable to sleep well that night The patient's narcotics, reglan, and nasal steroids were held during this hospitalization. On hospital day 1 her Seroquel was increased to 50 mg BID. A MMSE was performed and she scored 29/30. A full neuro exam was conducted which was normal with no focal deficits. On hospital day 2 the patient continued to deny any hallucinations and endorsed improved sleep. The patient remained alert and oriented to person, place, and time throughout her hospitalization.  Patient was instructed to discontinue narcotics and reglan at discharge.   ** CKD 4: Most likely due to underlying HTN. Her baseline creatinine is around 4.5-5 with her creatinine on admission being slightly elevated on admission at 5.56. On hospital day 1 her creatinine was closer to baseline at 5.35 and on hospital day 2 it continued to decline to the patient's baseline at 4.99.    ** HTN: Patient was kept on her home medications of Lopressor 100 BID, Norvasc 10 qd, and Clonidine 0.3. She received 1 dose of hydralizine 25mg  on admission and then her hydralizine was held. On admission her blood pressure was 211/73. Overnight her  blood pressure continued to decline and on hospital day 1 her blood pressure was down to 147/55. On hospital day 2 her blood pressures were again elevated at 166/57. She denied any vision changes or headaches. Patient was discharged on her home blood pressure medications. Hydralazine was restarted at time of discharge.   **HLD: Patient was continued on her lipitor 40mg  qd   Patient condition at time of discharge/disposition:  BP 178/61  Pulse 92  Temp 98.6 F (37 C) (Oral)  Resp 20  Ht 5\' 3"  (1.6 m)  Wt 144 lb 2.9 oz (65.4 kg)  BMI 25.54 kg/m2  SpO2 100% Physical Exam:  Gen: Elderly female laying in bed in NAD  HEENT: Mucosa moist. No scleral icterus.  CV: RRR. 1/6 systolic mummur heard best at the upper right sternal border.  Res: Clear breath sounds. No rales. No wheezes.  Abd: Soft. Non tender. Non distended. Bowel sounds present.  Ext/Musc: No edema  Neuro: Alert and oriented to person, place, and time. No motor or sensory deficits.   Patient is discharged home in stable medical condition.   Follow up issues: 1. Hallucinations - patient should be continued on Seroquel for 2-3 weeks following discharge until the steroids have run their course 2. CKD 4 - Her Calcitriol was discontinued on discharge due to her elevated calcium level. It appears that the patient may have been confused about her dose and was taking 0.5mg  TID. Consider further outpatient workup of hypercalcemia.  3. Hypertension - The patient's blood pressures were elevated throughout her hospitalization 147-192/57-87. Patient denied any headaches or vision changes. The patient is on a regimen that may be difficult due to her poor compliance. May consider changing to a regimen that is bid if possible.   Discharge follow up:  Discharge Orders    Future Appointments: Provider: Department: Dept Phone: Center:   03/11/2012 11:00 AM Brent Bulla, MD [facility]  [phone] [facility]   03/15/2012 11:30 AM Linna Darner, RD [facility] [phone] [facility]     Future Orders Please Complete By Expires   Diet Carb Modified      Increase activity slowly      Call MD for:      Scheduling Instructions:   Recurrent hallucinations     Regino Bellow  MS IV 03/02/2012  Family Medicine Upper Level Addendum:   I have seen and examined the patient independently, discussed with student Dr. Regino Bellow, fully reviewed the discharge sumamry and agree with it's contents with the appropriate changes madet. My independent exam is below.   BP 178/61  Pulse 92  Temp 98.6 F (37 C) (Oral)  Resp 20  Ht 5\' 3"  (1.6 m)  Wt 144 lb 2.9 oz (65.4 kg)  BMI 25.54 kg/m2  SpO2 100% Gen: NAD, resting comfortably in bed HEENT: NCAT, MMM, PERRLA  CV: RRR no mrg, did not appreciate murmur previously auscultated Lungs: CTAB  Abd: soft/nontender/nondistended/normal bowel sounds  MSK: moves all extremities, no edema  Skin: warm and dry, no rash  Neuro: alert and oriented x 3, grossly intact   Tana Conch, MD, PGY2 03/04/2012 1:12 AM

## 2012-03-02 NOTE — Progress Notes (Signed)
Saw patient and performed MMSE and neuro exam to evaluate cognitive ability and baseline neuro status  MMSE: 29 (-1 on 3 object delayed recall)   Neuro: Cranial nerves II-XII intact. No cerebellar deficit. No pronator drift. 5/5 strength in upper and lower extremities. Normal sensation to light touch and pinprick in upper and lower extremities. 2+ patellar and biceps reflex. Normal gait.  Regino Bellow   MS IV

## 2012-03-02 NOTE — H&P (Signed)
Seen and examined.  Discussed with Dr. Louanne Belton.  Agree with his admit, management and documentation.  Briefly, 71 yo female who is high functioning and lives independently presents with anxiety and tactile hallucinations - I would classify as mild delirium.  Her co morbid medical problems are well documented in Dr. Radonna Ricker note.  The main problem is: Delirium/hallucinations: etiology uncertain.  Most likely cause is drug induced via the combination of narcotics and systemic steroids (i.e. Steroid psychosis).  Other possibilities exist but are less likely.  At her age, there is always the concern for underlying dementia but there is little in the history to support that diagnosis at this time.  I think it is prudent to treat with regular dosing of the seroquel for a while (2 weeks?) until the steroid gets out of her system.  Then we can wean from the seroquel and watch for symptom recurrence.  I would observe overnight in the hospital.  Possible DC in am if stable.  I would not do further workup at this time beyond adding a TSH (nl 6 months ago)  She may have a little exophthalmos.

## 2012-03-02 NOTE — ED Provider Notes (Signed)
History     CSN: [id number]  Arrival date & time 03/01/12  2248   First MD Initiated Contact with Patient 03/02/12 0035      Chief Complaint  Patient presents with  . Hallucinations    (Consider location/radiation/quality/duration/timing/severity/associated sxs/prior treatment) HPI  Note that this is a late entry. This patient was seen and examined by me shortly after her presentation to the emergency department. The patient is a 71 year old woman with multiple chronic medical problems who lives with her daughter. She is brought to the emergency department by her daughter with complaints of visual hallucinations. This has been a problem for the past 2 weeks. But, the patient and daughter both feel that the patient's symptoms are becoming more frequent and more severe.  The patient says she has episodes of visual hallucinations during which she, for example, feels like there are small, shelled animals growing on her back. Sometimes, she is infested with engorging ticks all over her extremities and can't get them off. Other times, she seems worms or "entrails" on the walls. The patient has no h/o similar sx. Only recent med change is addition of Vicodin for management of back pain. But, this medication was added 6 wks ago, long before the patient's sx began. She has not taken any Vicodin for > 36 hrs and says she is still having intermittent sx.   The patient seems to have good insight into her sx. Dtr supports all of the patient's statements. The patient says she feels like her sx are most prevalent when she is on the verge of falling asleep.  No history of alcohol use. No history of encephalopathy. Patient received what sounds like a facet joint injection with a steroid 3d ago.   Past Medical History  Diagnosis Date  . Hyperlipidemia   . Hypertension   . Chronic kidney disease   . Normal cardiac stress test 12/24/2009    lexiscan, imaging normal  . ANEMIA NEC 03/31/2007    Qualifier:  Diagnosis of  By: Gavin Potters MD, [name]    . Diverticulitis   . IBS (irritable bowel syndrome)   . Thyroid disease     Past Surgical History  Procedure Date  . Cardiac catheterization 2003    normal  . Cholecystectomy     Open mid line incision  . Frontal craniotomy 2002    indication = sinusitis  . Appendectomy   . Tubal ligation   . Abdominal hysterectomy     No family history on file.  History  Substance Use Topics  . Smoking status: Current Some Day Smoker -- 1.0 packs/day for 60 years    Types: Cigarettes  . Smokeless tobacco: Not on file  . Alcohol Use: No    OB History    Grav Para Term Preterm Abortions TAB SAB Ect Mult Living   5 3 3              Review of Systems  Gen: no weight loss, fevers, chills, night sweats Eyes: no discharge or drainage, no occular pain or visual changes Nose: no epistaxis or rhinorrhea Mouth: no dental pain, no sore throat Neck: no neck pain Lungs: no SOB, cough, wheezing CV: no chest pain, palpitations, dependent edema or orthopnea Abd: no abdominal pain, nausea, vomiting GU: no dysuria or gross hematuria MSK: no myalgias or arthralgias Neuro: no headache, no focal neurologic deficits Skin: no rash Psyche: As per history of present illness, otherwise negative  Allergies  Codeine; Penicillins; Pregabalin; and Sulfamethoxazole  Home  Medications   Current Outpatient Rx  Name Route Sig Dispense Refill  . ALLOPURINOL 100 MG PO TABS Oral Take 1 tablet (100 mg total) by mouth daily. 30 tablet 11  . AMLODIPINE BESYLATE 10 MG PO TABS Oral Take 1 tablet (10 mg total) by mouth daily. 30 tablet 11  . ASPIRIN 81 MG PO CHEW Oral Chew 81 mg by mouth daily.      . ATORVASTATIN CALCIUM 40 MG PO TABS Oral Take 1 tablet (40 mg total) by mouth daily. 30 tablet 11  . CALCITRIOL 0.25 MCG PO CAPS Oral Take 2 tablets by mouth Three times a day.    Marland Kitchen CLONIDINE HCL 0.1 MG PO TABS Oral Take 1 tablet (0.1 mg total) by mouth 3 (three) times daily. 90  tablet 11  . FLUTICASONE PROPIONATE 50 MCG/ACT NA SUSP Nasal Place 2 sprays into the nose daily.    Marland Kitchen HYDRALAZINE HCL 50 MG PO TABS Oral Take 1 tablet (50 mg total) by mouth 3 (three) times daily. 90 tablet 11  . HYDROCODONE-ACETAMINOPHEN 5-500 MG PO TABS  Take one half or one tablet twice a day for back pain 60 tablet 5  . HYDROCORTISONE 2.5 % EX OINT Topical Apply topically 2 (two) times daily.      Marland Kitchen LORATADINE 10 MG PO TABS Oral Take 10 mg by mouth daily.    Marland Kitchen METOCLOPRAMIDE HCL 5 MG PO TABS Oral Take 1 tablet by mouth daily.    Marland Kitchen METOPROLOL TARTRATE 100 MG PO TABS  TAKE ONE TABLET BY MOUTH TWICE DAILY 60 tablet 11  . OMEPRAZOLE 20 MG PO TBEC Oral Take 1 tablet (20 mg total) by mouth daily. 30 each 11    BP 147/55  Pulse 79  Temp 98.3 F (36.8 C) (Oral)  Resp 16  SpO2 100%  Physical Exam Gen: well developed and well nourished appearing, alert and oriented Head: NCAT Eyes: PERL, EOMI Nose: no epistaixis or rhinorrhea Mouth/throat: mucosa is moist and pink Neck: supple, no stridor Lungs: CTA B, no wheezing, rhonchi or rales Abd: soft, notender, nondistended Back: no ttp, no cva ttp, kyphosis noted Skin: no rashese, wnl Neuro: CN ii-xii grossly intact, no focal deficits Psyche; normal affect,  calm and cooperative.   ED Course  Procedures (including critical care time)  Labs Reviewed  CBC WITH DIFFERENTIAL - Abnormal; Notable for the following:    WBC 11.0 (*)     RBC 3.78 (*)     Hemoglobin 10.4 (*)     HCT 32.3 (*)     RDW 16.4 (*)     Neutrophils Relative 87 (*)     Neutro Abs 9.5 (*)     Lymphocytes Relative 7 (*)     All other components within normal limits  COMPREHENSIVE METABOLIC PANEL - Abnormal; Notable for the following:    Sodium 133 (*)     CO2 14 (*)     Glucose, Bld 110 (*)     BUN 62 (*)     Creatinine, Ser 5.56 (*)     Calcium 11.0 (*)     Albumin 3.1 (*)     Total Bilirubin 0.2 (*)     GFR calc non Af Amer 7 (*)     GFR calc Af Amer 8 (*)       All other components within normal limits  URINALYSIS, ROUTINE W REFLEX MICROSCOPIC - Abnormal; Notable for the following:    APPearance CLOUDY (*)     Protein, ur  30 (*)     All other components within normal limits  URINE MICROSCOPIC-ADD ON - Abnormal; Notable for the following:    Squamous Epithelial / LPF MANY (*)     Bacteria, UA FEW (*)     All other components within normal limits  POCT I-STAT 3, BLOOD GAS (G3P V) - Abnormal; Notable for the following:    pH, Ven 7.226 (*)     pCO2, Ven 36.5 (*)     pO2, Ven 52.0 (*)     Bicarbonate 15.1 (*)     Acid-base deficit 12.0 (*)     All other components within normal limits  URINE RAPID DRUG SCREEN (HOSP PERFORMED) - Abnormal; Notable for the following:    Opiates POSITIVE (*)     All other components within normal limits  AMMONIA   Dg Chest 2 View  03/02/2012  *RADIOLOGY REPORT*  Clinical Data: Altered mental status  CHEST - 2 VIEW  Comparison: 12/11/2011  Findings: Mild chronic peribronchial thickening.  No new areas of consolidation.  No pleural effusion or pneumothorax. Cardiomediastinal contours remain within normal limits.  Aortic arch atherosclerosis noted.  Multilevel degenerative changes.  No acute osseous finding.  IMPRESSION: Mild peribronchial thickening is similar to prior.  No focal consolidation.   Original Report Authenticated By: Waneta Martins, M.D.    Ct Head Wo Contrast  03/02/2012  *RADIOLOGY REPORT*  Clinical Data: Hallucinations  CT HEAD WITHOUT CONTRAST  Technique:  Contiguous axial images were obtained from the base of the skull through the vertex without contrast.  Comparison: 06/08/2010 MRI  Findings: Evidence of prior bifrontal craniotomy.  Left anterior inferior encephalomalacia.There is no evidence for acute hemorrhage, hydrocephalus, mass lesion, or abnormal extra-axial fluid collection.  No definite CT evidence for acute infarction. The visualized paranasal sinuses and mastoid air cells are  predominately clear.  IMPRESSION: Postoperative changes of bifrontal craniotomy.  Left anterior inferior encephalomalacia without CT evidence of acute intracranial abnormality.   Original Report Authenticated By: Waneta Martins, M.D.      1. Hallucination   2. Hypercalcemia   3. Metabolic acidosis   4. Kidney failure   5. Hypertension     Case discussed with [facility] resident who has accepted the patient admission for further tx and evaluation of new onset and persistent visual hallucinations with incidental finding of hypercalcemia and chronic metabolic acidosis.   MDM          Brandt Loosen, MD 03/02/12 [phone]

## 2012-03-02 NOTE — ED Notes (Signed)
pt's daughter upset that pt has not gone upstairs yet; explained to daughter that the RN that was going to have pt upstairs had a pt get critical, so was with that pt and was unable to take report; updated her that report has been given and that pt will go upstairs at 0730

## 2012-03-02 NOTE — Progress Notes (Signed)
[facility] Attending Daily Note:  Renold Don MD  [phone] pager  Family Practice pager:  [phone] I have reviewed this patient and have reviewed their chart. I have discussed this patient with the resident and admitting attending physician Dr. Leveda Anna. I agree with their findings, assessment and care plan.

## 2012-03-02 NOTE — H&P (Signed)
Family Medicine Teaching [facility] Admission History and Physical Service Pager: [phone]  Patient name: [name] Medical record number: [id number] Date of birth: 03-28-1941 Age: 71 y.o. Gender: female  Primary Care Provider: Majel Homer, MD  Chief Complaint: Skin crawling  Assessment and Plan: [name] is a 71 y.o. year old female with CKD 4 and HTN presenting with worsening hallucinations for two days.  1. Hallucinations: Most likely multi-factorial. causes  including age, hydrocodone use, recent steroid injection, and sleep depravity. Other Ddx include: psychosis, underlying dementia, and uremia. With her waxing and waning coarse psychosis is less likely and her BUN of 60 doesn't indicate a clear renal origin. As to the amount dementia is contributing to her current state is difficult to assess at this time but will be discussed/evaluated after she gets some rest.  1. Hold narcotics, reglan, and nasal steroids 2. Seroquel 25 qd starting now and evaluate for relief of symptoms in the am 3. Monitor 4. Consider mini mental after patient gets some sleep 5. Doubt true delirium as patient was not actually altered during episodes  2. CKD- Most likely secondary to HTN. Her baseline creatinine appears to be around 4.5 to 5 which is slightly increased today to 5.5.  1. Renal panel at noon in the am to trend creatine 2. Consider Renal consult in the Am if her mental status doesn't improve and renal origin needs more evaluation.  3. HTN- elevated up to 211/73 this evening.  1. Home meds include: Lopressor 100 BID, Norvasc 10 qd, Clonidine 0.3 TID, Hydralizine 50 TID 2. Continue home meds and consider increasing if she continues to be hypertensive  3. Single dose oral hydralazine 25mg  now.  4. HLD: LDL in 08/2011 was 76 1. Continue lipitor 40   5. FEN/GI: Renal diet 6. Prophylaxis: Subq heparin 7. Disposition: admit to med surg floor for monitoring, home pending clearing of her  mental status  8. Code Status: Full  History of Present Illness: [name] is a 71 y.o. year old female with CKD stage 4  presenting with hallucinations for 1 and a half months. Hallucinations have been primarily tactile in nature, she describes feeling like oysters and worms on her skin "that would get her" if she slept. Her daughter states that it has been much worse over the last two days. Patient states that she feels very agitated and hasn't slept since last week. She feels that this is due to her taking hydrocodone that she started 1.5 months ago and also the epidural steroid injection she received 4 days ago. She hasnt taken any hydrocodone in 2 days but is still having the hallucinations. No other medication changes recently. Denies dysuria, dyspnea, and abdominal pain. She denies SI and HI. She says that the episodes are so intense that she just cant keep herself calmed down. The patient and her daughter agree that the episodes come and go without obvious exacerbating factors. Positive PMH of anxiety and depression with one admission to a mental health unit but denies any history of hallucinations.   Patient did have one episode during our interview.  She became acutely agitated, would not lie still in bed, and was hitting the bed with both arms.  She indicated that she felt things crawling on her and that she felt they were going to eat her.  Patient Active Problem List  Diagnosis  . HYPERCHOLESTEROLEMIA  . Gout, unspecified  . ANEMIA NEC  . ANXIETY STATE, UNSPECIFIED  . TOBACCO ABUSE  .  DEPRESSIVE DISORDER, NOS  . HYPERTENSION, BENIGN SYSTEMIC  . GASTROESOPHAGEAL REFLUX, NO ESOPHAGITIS  . HIATAL HERNIA  . CHRONIC KIDNEY DISEASE STAGE IV (SEVERE)  . SECONDARY HYPERPARATHYROIDISM  . BLADDER PROLAPSE  . OSTEOARTHRITIS, GENERALIZED, MULTIPLE JOINTS  . HIP PAIN, BILATERAL  . Dysphagia, unspecified  . FECAL INCONTINENCE  . INCONTINENCE, URGE  . Gastroparesis  . Low back pain  radiating to both legs   Past Medical History: Past Medical History  Diagnosis Date  . Hyperlipidemia   . Hypertension   . Chronic kidney disease   . Normal cardiac stress test 12/24/2009    lexiscan, imaging normal  . 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 Diverticulitis   . IBS (irritable bowel syndrome)   . Thyroid disease    Past Surgical History: Past Surgical History  Procedure Date  . Cardiac catheterization 2003    normal  . Cholecystectomy     Open mid line incision  . Frontal craniotomy 2002    indication = sinusitis  . Appendectomy   . Tubal ligation   . Abdominal hysterectomy    Social History: History  Substance Use Topics  . Smoking status: Current Some Day Smoker -- 1.0 packs/day for 60 years    Types: Cigarettes  . Smokeless tobacco: Not on file  . Alcohol Use: No   For any additional social history documentation, please refer to relevant sections of EMR.  Family History: No family history on file. Allergies: Allergies  Allergen Reactions  . Codeine   . Penicillins     REACTION: unspecified--NOT problems breathing. Has tolerated omnicef in past without issue  . Pregabalin     REACTION: confusion  . Sulfamethoxazole     REACTION: unspecified   No current facility-administered medications on file prior to encounter.   Current Outpatient Prescriptions on File Prior to Encounter  Medication Sig Dispense Refill  . allopurinol (ZYLOPRIM) 100 MG tablet Take 1 tablet (100 mg total) by mouth daily.  30 tablet  11  . amLODipine (NORVASC) 10 MG tablet Take 1 tablet (10 mg total) by mouth daily.  30 tablet  11  . aspirin 81 MG chewable tablet Chew 81 mg by mouth daily.        Marland Kitchen atorvastatin (LIPITOR) 40 MG tablet Take 1 tablet (40 mg total) by mouth daily.  30 tablet  11  . calcitRIOL (ROCALTROL) 0.25 MCG capsule Take 2 tablets by mouth Three times a day.      . cloNIDine (CATAPRES) 0.1 MG tablet Take 1 tablet (0.1  mg total) by mouth 3 (three) times daily.  90 tablet  11  . hydrALAZINE (APRESOLINE) 50 MG tablet Take 1 tablet (50 mg total) by mouth 3 (three) times daily.  90 tablet  11  . HYDROcodone-acetaminophen (VICODIN) 5-500 MG per tablet Take one half or one tablet twice a day for back pain  60 tablet  5  . hydrocortisone 2.5 % ointment Apply topically 2 (two) times daily.        . metoCLOPramide (REGLAN) 5 MG tablet Take 1 tablet by mouth daily.      . metoprolol (LOPRESSOR) 100 MG tablet TAKE ONE TABLET BY MOUTH TWICE DAILY  60 tablet  11  . Omeprazole (KLS OMPERAZOLE) 20 MG TBEC Take 1 tablet (20 mg total) by mouth daily.  30 each  11  . DISCONTD: metoCLOPramide (REGLAN) 5 MG/5ML solution Take 5 mLs (5 mg total) by mouth  4 (four) times daily -  before meals and at bedtime.  120 mL  5   Review Of Systems: Per HPI, otherwise 12 point review of systems was performed and was unremarkable.  Physical Exam: BP 192/87  Pulse 79  Temp 98.6 F (37 C) (Oral)  Resp 14  SpO2 98% Exam: Gen: NAD, alert, cooperative with exam, moaning and saying "they'll get me if I fall asleep" and  "when will the peace and rest come" during an episode HEENT: NCAT, EOMI, PERRL CV: RRR, 2/6 systolic murmur, good s1/s2 Resp: CTABL, no wheezes, non-labored Abd: SNTND, BS present, no guarding or organomegaly Ext: No edema, 2+ DP and radial pulses Neuro: Alert and oriented to person, place, time, and date, No gross deficits Psych: appropriate mood and affect. Poor insight into health condition, speech normal rate and tone, thought process logical    Labs and Imaging: CBC BMET   Lab 03/01/12 2326  WBC 11.0*  HGB 10.4*  HCT 32.3*  PLT 249    Lab 03/01/12 2326  NA 133*  K 4.1  CL 104  CO2 14*  BUN 62*  CREATININE 5.56*  GLUCOSE 110*  CALCIUM 11.0*     CT head W/o 03/02/2012 IMPRESSION: Postoperative changes of bifrontal craniotomy. Left anterior inferior encephalomalacia without CT evidence of acute  intracranial abnormality.   DG Chest 2 view 03/02/2012 IMPRESSION: Mild peribronchial thickening is similar to prior. No focal consolidation.   Kevin Fenton, MD 03/02/2012, 3:21 AM   I have seen and evaluated the patient and agree with the above.  I have edited the above H+P and its contents reflect my exam, assessment, and plan. [name] 03/02/2012,8:12 AM

## 2012-03-02 NOTE — ED Notes (Signed)
Pt endorses 2 weeks of visual and tactile hallucinations; states feels as if this is r/t Vicodin - states has been taking Vicodin x1 1/2 months for arthritis; reports was taking 2 tabs daily - last dose x2 days ago; denies SI nor HI at this time; also endorses insomnia; pt states has been seeing "bugs and things" crawling on her; presently caox4, calm, appropriate in conversation; denies any medical complaints at this time

## 2012-03-02 NOTE — Progress Notes (Signed)
Utilization review complete 

## 2012-03-02 NOTE — Progress Notes (Signed)
Family Medicine Teaching Service                                                                 [facility] Progress Note     Patient name: [name] Medical record number: [id number] Date of birth: 10-12-1940 Age: 71 y.o. Gender: female    LOS: 0 days   Primary Care Provider: Majel Homer, MD   [phone] F with multiple medical problems hospital day 1 with tactile hallucinations.    Overnight Events: No acute events overnight. Patient was started on Seroquel 25mg . She states that this did help some with the hallucinations but that she continued to have them in the night and had difficulty sleeping.    Objective: Vital signs in last 24 hours: BP 175/59  Pulse 80  Temp 98 F (36.7 C) (Oral)  Resp 16  Ht 5\' 3"  (1.6 m)  Wt 145 lb 11.6 oz (66.1 kg)  BMI 25.81 kg/m2  SpO2 100%    Physical Exam: Gen: Elderly female sitting up in bed in NAD HEENT:  Mucosa moist. No scleral icterus.   CV: RRR. 1/6 systolic mummur heard best at the upper right sternal border.  Res: Clear breath sounds. No rales. No wheezes. Abd: Soft. Non tender. Non distended. Bowel sounds present. Ext/Musc: No edema Neuro: Alert and oriented to person, place, and time.   Labs/Studies: no new labs  Medications: Scheduled Meds:   . allopurinol  100 mg Oral Daily  . amLODipine  10 mg Oral Daily  . aspirin  81 mg Oral Daily  . atorvastatin  40 mg Oral Daily  . calcitRIOL  0.25 mcg Oral Daily  . cloNIDine  0.1 mg Oral TID  . heparin  5,000 Units Subcutaneous Q8H  . hydrALAZINE  25 mg Oral NOW  . metoprolol  100 mg Oral BID  . pantoprazole  40 mg Oral Q1200  . QUEtiapine  50 mg Oral BID  . sodium chloride  3 mL Intravenous Q12H  . sodium chloride  3 mL Intravenous Q12H  . DISCONTD: hydrALAZINE  50 mg Oral TID  . DISCONTD: QUEtiapine  25 mg Oral QHS   Continuous Infusions:  PRN Meds:.sodium chloride, sodium chloride    Assessment/Plan:  71yo F with  CKD 4 and HTN hospital day 1 with hallucinations.  1. Hallucinations: Most likely multi-factorial due to age, hydrocodone use, recent steroid injection, and sleep deprivation. Patient was given seroquel 25mg  qd which helped with her symptoms overnight, but she was still unable to sleep well. -- Continue to hold Hold narcotics, reglan, and nasal steroids -- Increase to Seroquel 50 mg BID -- MMSE   2. CKD 4: Most likely due to underlying HTN. Her baseline creatinine is around 4.5-5 with her creatinine on admission being slightly elevated on admission at 5.56. --f/u on renal panel  3. HTN: Patient is on her home medications of Lopressor 100 BID, Norvasc 10 qd, Clonidine 0.3 TID, and Hydralizine 50 TID. Her blood pressures have continued to be elevated ranging 147-211/55-73 -- Will monitor BP after she receives AM medications and adjust accordingly  4. HLD:  -- Continue lipitor 40mg  qd  5. FEN/GI: renal diet  6. Prophylaxis: Subq heparin  7. Disposition: home pending clearing of her mental status   8.  Code Status: Full  Regino Bellow  MS IV           PGY III ADDENDUM:  Patient seen and examine with MS IV.  Available data reviewed.  Agree with findings, assessment, and plan as outlined as above with the following additions:  S: Patient complaints of not sleeping well overnight despite low dose Seroquel.  She did have episodes of "crawling" sensation on bilateral arms/hands.  Denies any pain at this time.  Physical Exam: Gen: sitting up in bed, in NAD HEENT:  Mucosa moist. No scleral icterus.   CV: RRR. 1/6 systolic murmur heard best at the upper right sternal border.  Res: Clear breath sounds. No rales. No wheezes. Abd: Soft. Non tender. Non distended. Bowel sounds present. Ext/Musc: No edema Neuro: Alert and oriented to person, place, and time.   A/P:  # Hallucinations: likely a combination of steroid induced psychosis vs. Underlying psychiatric disorder.  Will increase  Seroquel to 50 BID and monitor events overnight.  # CKD 4: Will monitor BMET this AM.  # HTN: BP has come down 147/55 this morning.  She is on multiple anti-hypertensive medications.  Will resume home medications.  Ivy de [employer], DO 03/02/2012 9:36 AM

## 2012-03-03 LAB — BASIC METABOLIC PANEL
BUN: 66 mg/dL — ABNORMAL HIGH (ref 6–23)
CO2: 17 mEq/L — ABNORMAL LOW (ref 19–32)
GFR calc non Af Amer: 8 mL/min — ABNORMAL LOW (ref >90)
Glucose, Bld: 120 mg/dL — ABNORMAL HIGH (ref 70–99)
Potassium: 4.6 mEq/L (ref 3.5–5.1)
Sodium: 139 mEq/L (ref 135–145)

## 2012-03-03 MED ORDER — QUETIAPINE FUMARATE 50 MG PO TABS: 50.0000 mg | ORAL_TABLET | Freq: Two times a day (BID) | ORAL | Status: DC

## 2012-03-03 MED ORDER — HYDRALAZINE HCL 50 MG PO TABS
50.0000 mg | ORAL_TABLET | Freq: Three times a day (TID) | ORAL | Status: DC
  Administered 2012-03-03: 50 mg via ORAL
  Filled 2012-03-03 (×3): qty 1

## 2012-03-03 NOTE — Progress Notes (Signed)
FMTS Attending Daily Note:  Renold Don MD  [phone] pager  Family Practice pager:  [phone] I have seen and examined this patient and have reviewed their chart. I have discussed this patient with the resident. I agree with the resident's findings, assessment and care plan.  Much improved, no further hallucinations.  Ok for DC home today.  Likely etiology abrupt narcotic cessation.

## 2012-03-03 NOTE — Progress Notes (Signed)
Family Medicine Teaching Service                                                                 [facility] Progress Note     Patient name: [name] Medical record number: [id number] Date of birth: [birth year] Age: 71 y.o. Gender: female    LOS: 1 day   Primary Care Provider: Majel Homer, MD  [phone] F with multiple medical problems hospital day 1 with tactile hallucinations.   Overnight Events: No acute events overnight. Patient continues to deny any hallucinations and states that she slept better last night.   Objective: Vital signs in last 24 hours: BP 166/57  Pulse 65  Temp 98.6 F (37 C) (Oral)  Resp 18  Ht 5\' 3"  (1.6 m)  Wt 144 lb 2.9 oz (65.4 kg)  BMI 25.54 kg/m2  SpO2 100%    Physical Exam: Gen: Elderly female laying in bed in NAD  HEENT: Mucosa moist. No scleral icterus.  CV: RRR. 1/6 systolic mummur heard best at the upper right sternal border.  Res: Clear breath sounds. No rales. No wheezes.  Abd: Soft. Non tender. Non distended. Bowel sounds present.  Ext/Musc: No edema  Neuro: Alert and oriented to person, place, and time. No motor or sensory deficits.   Labs/Studies: Chem: NA 139  K 4.6  Cl 111  CO2 17  BUN 66  Creatinine 4.99  Calcium 10.8  Glucose 120  Ammonia 48  TSH 1.286  Medications: Scheduled Meds:   . allopurinol  100 mg Oral Daily  . amLODipine  10 mg Oral Daily  . aspirin  81 mg Oral Daily  . atorvastatin  40 mg Oral Daily  . calcitRIOL  0.25 mcg Oral Daily  . cloNIDine  0.1 mg Oral TID  . heparin  5,000 Units Subcutaneous Q8H  . influenza  inactive virus vaccine  0.5 mL Intramuscular Tomorrow-1000  . metoprolol  100 mg Oral BID  . pantoprazole  40 mg Oral Q1200  . QUEtiapine  50 mg Oral BID  . sodium chloride  3 mL Intravenous Q12H  . sodium chloride  3 mL Intravenous Q12H  . DISCONTD: QUEtiapine  50 mg Oral BID   Continuous Infusions:  PRN Meds:.sodium chloride, sodium  chloride     Assessment/Plan: 1. Hallucinations: Most likely multi-factorial due to age, hydrocodone use, recent steroid injection, and sleep deprivation. MMSE was 29/30 and full neuro exam revealed no focial deficits. Patient's seroquel was increased to 50mg  qd which she states helped her sleep a little bit better last night. She continues to deny any hallucinations.  -- Continue to hold Hold narcotics, reglan, and nasal steroids  -- Seroquel 50 mg BID    2. CKD 4: Most likely due to underlying HTN. Her baseline creatinine is around 4.5-5 with her creatinine on admission being slightly elevated on admission at 5.56. It has trended down back to her baseline at 4.99.   3. HTN: Patient is on her home medications of Lopressor 100 BID, Norvasc 10 qd, Clonidine 0.3 TID, and Hydralizine 50 TID. Her blood pressures have continued to be elevated ranging 160-175/57-70  4. HLD:  -- Continue lipitor 40mg  qd   5. FEN/GI: renal diet   6. Prophylaxis: Subq heparin   7. Disposition: home  today   8. Code Status: Full    Regino Bellow   MS IV             PGY 3 ADDENDUM:  Patient seen and examined. Available data reviewed. Agree with findings, assessment, and plan as outlined above.  My additional findings: patient says she feels agitated, mostly from being in hospital for too long.  She is ready to go home.  Denies any tactile hallucinations since admission.    Physical Exam: Gen: laying in bed in NAD  HEENT: Mucosa moist. No scleral icterus.  CV: RRR. 1/6 systolic mummur heard best at the upper right sternal border.  Res: Clear breath sounds. No rales. No wheezes.  Abd: Soft. Non tender. Non distended. Bowel sounds present.  Neuro: No focal deficits.  Plan as above.   Restarted Hydralazine 50 TID (home dose).  Was held yesterday. Anticipate D/C home today on Seroquel 50 BID.    Okley Magnussen de [employer], DO 03/03/2012 9:40 AM

## 2012-03-04 NOTE — Discharge Summary (Signed)
Family Medicine Teaching Service  Discharge Note : Attending Jeff Giuseppina Quinones MD Pager [phone] Inpatient Team Pager:  [phone]  I have seen and examined this patient, reviewed their chart and discussed discharge planning with the resident at the time of discharge. I agree with the discharge plan as above.  

## 2012-03-08 ENCOUNTER — Telehealth: Payer: Self-pay | Admitting: *Deleted

## 2012-03-08 DIAGNOSIS — M4800 Spinal stenosis, site unspecified: Secondary | ICD-10-CM

## 2012-03-08 NOTE — Telephone Encounter (Signed)
Rx done per Dr. Jennette Kettle.

## 2012-03-08 NOTE — Telephone Encounter (Signed)
Message copied by Mora Bellman on Mon Mar 08, 2012  2:50 PM ------      Message from: Claiborne Billings      Created: Mon Mar 08, 2012  1:35 PM      Contact: [phone]       Daughter - Delorse Lek - would like to know if mom can get a rolling walker (prescription)

## 2012-03-11 ENCOUNTER — Ambulatory Visit (INDEPENDENT_AMBULATORY_CARE_PROVIDER_SITE_OTHER): Payer: Medicare Other | Admitting: Family Medicine

## 2012-03-11 ENCOUNTER — Encounter: Payer: Self-pay | Admitting: Family Medicine

## 2012-03-11 VITALS — BP 165/70 | HR 66 | Temp 98.2°F | Wt 149.0 lb

## 2012-03-11 DIAGNOSIS — R443 Hallucinations, unspecified: Secondary | ICD-10-CM | POA: Diagnosis not present

## 2012-03-11 DIAGNOSIS — N184 Chronic kidney disease, stage 4 (severe): Secondary | ICD-10-CM | POA: Diagnosis not present

## 2012-03-11 DIAGNOSIS — I10 Essential (primary) hypertension: Secondary | ICD-10-CM

## 2012-03-11 DIAGNOSIS — N2581 Secondary hyperparathyroidism of renal origin: Secondary | ICD-10-CM

## 2012-03-11 DIAGNOSIS — R5381 Other malaise: Secondary | ICD-10-CM

## 2012-03-11 LAB — COMPREHENSIVE METABOLIC PANEL
ALT: <8 U/L (ref 0–35)
AST: 6 U/L (ref 0–37)
Alkaline Phosphatase: 55 U/L (ref 39–117)
BUN: 61 mg/dL — ABNORMAL HIGH (ref 6–23)
Chloride: 114 mEq/L — ABNORMAL HIGH (ref 96–112)
Creat: 4.92 mg/dL — ABNORMAL HIGH (ref 0.50–1.10)
Glucose, Bld: 90 mg/dL (ref 70–99)
Potassium: 5.7 mEq/L — ABNORMAL HIGH (ref 3.5–5.3)
Total Protein: 5.7 g/dL — ABNORMAL LOW (ref 6.0–8.3)

## 2012-03-11 MED ORDER — SENNA-DOCUSATE SODIUM 8.6-50 MG PO TABS: 2.0000 | ORAL_TABLET | Freq: Every day | ORAL | Status: DC

## 2012-03-11 MED ORDER — POLYETHYLENE GLYCOL 3350 17 GM/SCOOP PO POWD: 17.0000 g | Freq: Two times a day (BID) | ORAL | Status: DC

## 2012-03-11 NOTE — Patient Instructions (Signed)
It was good to see you again today! Keep your appointment with our nutritionist. I have made a referral to physical therapy.  Someone should be contacting you in the next several days about setting this up. I want you to start taking Miralax two times per day and Senna once per day until you are having soft bowel movements every day. We will recheck your kidney function and your calcium. I would recommend getting an appointment with the kidney doctors to talk about what things you need to do in preparation for dialysis.

## 2012-03-11 NOTE — Progress Notes (Signed)
Patient ID: [name], female   DOB: 06-Dec-1940, 71 y.o.   MRN: [id number] Subjective: The patient is a 71 y.o. year old female who presents today for hfu.  1. Hallucinations: Not much change in feelings of things crawling on her.  Somewhat less agitated.  Still has a lot of questions about what happened and why.  Her daughter feels the Seroquel is helping at least somewhat.  Is not seeing things that aren't there.  Is not disoriented when she is feeling this.  2. Deconditioning:  Since getting home from hospital she has not been getting up much.  She feels very weak and doesn't have much of an apetitie.  She has had some problems with weight loss for greater than a year now and has had worse PO intake since coming home.  She cannot easily say why.  She denies actual nausea or a lack of apetitie.  3. CKD 4: While in the hospital she was found to be in AKI but was trending down by discharge.  She is continuing to urinate and does not have any AMS or pruritis.  Patient's past medical, social, and family history were reviewed and updated as appropriate. History  Substance Use Topics  . Smoking status: Current Every Day Smoker -- 1.0 packs/day for 60 years    Types: Cigarettes  . Smokeless tobacco: Never Used  . Alcohol Use: No   Objective:  Filed Vitals:   03/11/12 1137  BP: 165/70  Pulse: 66  Temp: 98.2 F (36.8 C)   Gen: Weak appearing but no distress CV: RRR Resp: CTABL Ext: No edema Neuro: Weak but able to stand and move all extremities equally.  Alert and oriented x3.  Not responding to external stimuli  Assessment/Plan:  Please also see individual problems in problem list for problem-specific plans.

## 2012-03-12 ENCOUNTER — Other Ambulatory Visit: Payer: Self-pay | Admitting: Family Medicine

## 2012-03-12 ENCOUNTER — Encounter: Payer: Self-pay | Admitting: Family Medicine

## 2012-03-12 ENCOUNTER — Telehealth: Payer: Self-pay | Admitting: *Deleted

## 2012-03-12 DIAGNOSIS — N184 Chronic kidney disease, stage 4 (severe): Secondary | ICD-10-CM

## 2012-03-12 DIAGNOSIS — M545 Low back pain: Secondary | ICD-10-CM

## 2012-03-12 MED ORDER — QUETIAPINE FUMARATE 50 MG PO TABS: 50.0000 mg | ORAL_TABLET | Freq: Two times a day (BID) | ORAL | Status: DC

## 2012-03-12 NOTE — Telephone Encounter (Signed)
Pt's daughter called stating pt needs a rollator - 4 wheel walker instead of regular rolling walker.

## 2012-03-12 NOTE — Telephone Encounter (Signed)
Daughter is calling because when she and her mother were in yesterday, they forgot to request a refill on Seroquel.  The pharmacy is [facility] on [address].

## 2012-03-15 ENCOUNTER — Ambulatory Visit: Payer: Medicare Other | Admitting: Family Medicine

## 2012-03-16 ENCOUNTER — Encounter: Payer: Self-pay | Admitting: Family Medicine

## 2012-03-16 DIAGNOSIS — R5381 Other malaise: Secondary | ICD-10-CM | POA: Insufficient documentation

## 2012-03-16 DIAGNOSIS — N184 Chronic kidney disease, stage 4 (severe): Secondary | ICD-10-CM | POA: Diagnosis not present

## 2012-03-16 DIAGNOSIS — N2581 Secondary hyperparathyroidism of renal origin: Secondary | ICD-10-CM | POA: Diagnosis not present

## 2012-03-16 NOTE — Assessment & Plan Note (Signed)
Will recheck Cr today along with K.  I anticipate that it will be better than before.  However, I would also encourage patient to get back to see renal.  She has a lot of anxiety about the actual process of starting HD and wants to know what to expect.  While I do not know a time table, I suggested that they would be better able to answer questions about the process than i am.

## 2012-03-16 NOTE — Assessment & Plan Note (Signed)
Patient has had slow decline in functional status for some time.  Period of inactivity in the hospital has not been beneficial for her.  Will plan to refer for OP PT to help with strengthening.  Encourage OOB and ambulation with assistance as much as possible.

## 2012-03-16 NOTE — Assessment & Plan Note (Signed)
Appear to be improving but I do not completely understand why the patient is still having some.  For the time being will continue seroquil and see back in the near future.  Patient does not have a history of a psychotic illness and is completely lucid between episodes.  She is completely lucid here.  She has not had any vicoden in some time now.  All her symptoms did begin following her epidural steroid injection.  Given that, if she does not clear shortly, might consider an LP to see if there is some form of meningitis and might consider repeat neuro imaging.

## 2012-03-17 ENCOUNTER — Encounter: Payer: Self-pay | Admitting: Family Medicine

## 2012-03-17 ENCOUNTER — Ambulatory Visit (INDEPENDENT_AMBULATORY_CARE_PROVIDER_SITE_OTHER): Payer: Medicare Other | Admitting: Family Medicine

## 2012-03-17 VITALS — BP 160/60 | HR 75 | Temp 98.6°F | Wt 145.2 lb

## 2012-03-17 DIAGNOSIS — F29 Unspecified psychosis not due to a substance or known physiological condition: Secondary | ICD-10-CM

## 2012-03-17 NOTE — Patient Instructions (Signed)
It was good to see you today.  I am sorry you are having such a hard time. I would recommend that you go over to the [facility] Emergency Department to be evaluated.  Tell them that you are having problems with anxiety, being unable to sleep, and some hallucinations and that it is getting hard for your family to help you at home.

## 2012-03-17 NOTE — Progress Notes (Signed)
Patient ID: [name], female   DOB: [birth year], 71 y.o.   MRN: [id number] Subjective: The patient is a 71 y.o. year old female who presents today for anxiety, hallucinations, and inability to sleep. The patient was hospitalized on the first of the movements when she presented to the ER with tactile hallucinations, anxiety, and acute elevation of her creatinine. This was several days following an epidural steroid injection. She spent several days in the hospital where her renal function improved and her hallucinations did get somewhat better when started on Seroquel. She was found to be mildly hypercalcemic secondary to her stage IV kidney disease. She was seen in followup on the 10th of this month and at that time she appeared to be doing somewhat better. Her hallucinations were still present occasionally for possible is distress. I was paged by Dr. Caryn Section her kidney doctor yesterday when he was seeing her in followup and was informed that the patient was doing significantly worse.  Today, the patient's sister reports that she is unable to sleep, is very tearful, is intermittently seeing things that are not there, and is extremely anxious. She is also perseverating on many past events that have been negative. The patient strongly denies any suicidal or homicidal ideation and has a logical thought process. However, she does report that "there is a patch of corn growing out of the back of my head" and that "last night the periods at the end of sentences were growing bigger and bigger and weighing my head down." She does not, at this time, appear to be responding to external stimuli. She is not interested in continuing Seroquel because she got the impression from Dr. Caryn Section that this drug was "the worst thing for me ever."  Patient's past medical, social, and family history were reviewed and updated as appropriate. History  Substance Use Topics  . Smoking status: Current Every Day Smoker -- 1.0 packs/day for 60  years    Types: Cigarettes  . Smokeless tobacco: Never Used  . Alcohol Use: No   Objective:  Filed Vitals:   03/17/12 0857  BP: 160/60  Pulse: 75  Temp: 98.6 F (37 C)   For mental status exam, please see HPI.  Assessment/Plan:  Please also see individual problems in problem list for problem-specific plans.

## 2012-03-17 NOTE — Assessment & Plan Note (Signed)
The patient does not have any known organic condition that would be causing her symptoms. She has no evidence of infection anywhere and her most recent lab work shows her kidney function at baseline with normal calcium even when corrected for albumin. She does not have any other physical complaints. I discussed with her and her sister the options of starting a low-dose antipsychotic other than Seroquel, as the patient is managed in restarting Seroquel, and attempting to get followup with mental health as an outpatient her sister going to the [facility] long emergency department for evaluation. As I do not expect that we would be of get her seen by a mental health professional for a number of weeks as an outpatient I would lean toward evaluation at the emergency department. The patient's sister agrees. The patient was not entirely certain about this but was agreeable to that at the time she left our office. If the patient is a change of heart she is to return to our office to be seen.

## 2012-03-18 ENCOUNTER — Emergency Department (HOSPITAL_COMMUNITY)
Admission: EM | Admit: 2012-03-18 | Discharge: 2012-03-18 | Disposition: A | Discharge status: Home / Self Care | Payer: Medicare Other | Attending: Emergency Medicine | Admitting: Emergency Medicine

## 2012-03-18 ENCOUNTER — Encounter (HOSPITAL_COMMUNITY): Payer: Self-pay | Admitting: Emergency Medicine

## 2012-03-18 DIAGNOSIS — Z79899 Other long term (current) drug therapy: Secondary | ICD-10-CM | POA: Insufficient documentation

## 2012-03-18 DIAGNOSIS — E785 Hyperlipidemia, unspecified: Secondary | ICD-10-CM | POA: Diagnosis not present

## 2012-03-18 DIAGNOSIS — I129 Hypertensive chronic kidney disease with stage 1 through stage 4 chronic kidney disease, or unspecified chronic kidney disease: Secondary | ICD-10-CM | POA: Insufficient documentation

## 2012-03-18 DIAGNOSIS — Z7982 Long term (current) use of aspirin: Secondary | ICD-10-CM | POA: Insufficient documentation

## 2012-03-18 DIAGNOSIS — R443 Hallucinations, unspecified: Secondary | ICD-10-CM | POA: Diagnosis not present

## 2012-03-18 DIAGNOSIS — G4700 Insomnia, unspecified: Secondary | ICD-10-CM | POA: Diagnosis not present

## 2012-03-18 DIAGNOSIS — N189 Chronic kidney disease, unspecified: Secondary | ICD-10-CM | POA: Insufficient documentation

## 2012-03-18 DIAGNOSIS — F17200 Nicotine dependence, unspecified, uncomplicated: Secondary | ICD-10-CM | POA: Insufficient documentation

## 2012-03-18 HISTORY — DX: Depression, unspecified: F32.A

## 2012-03-18 HISTORY — DX: Major depressive disorder, single episode, unspecified: F32.9

## 2012-03-18 MED ORDER — ZOLPIDEM TARTRATE 5 MG PO TABS: 2.5000 mg | ORAL_TABLET | Freq: Every evening | ORAL | Status: DC | PRN

## 2012-03-18 NOTE — ED Notes (Signed)
Daughter called because pt given prescription for ambien for sleep. States med not working. Situation discussed with kaitlin szekalski, pa at ext [phone] and she states to give pt benadryl 50mg  po; daughter informed and instructed to f/u with pcp tomorrow.

## 2012-03-18 NOTE — ED Notes (Signed)
Patient states, "I had been taking Vicodin for left hip pain, and then I agreed to have an epidural.  I was admitted at the beginning of October for hallucinations and anxiety.  The doctor sent me home on Seroquel.  It hasn't gotten any better, if anything it's gotten worse.  I also have a h/o kidney failure, and I think that might have something to do with it.  My calcium levels were high, and they took me off Caltrate.  I am unaware of what type of medicine they used for my epidural.  Patient denies SI and HI.  Patient reports only visual hallucinations and denies auditory."

## 2012-03-18 NOTE — ED Provider Notes (Signed)
History     CSN: [id number]  Arrival date & time 03/18/12  1410   First MD Initiated Contact with Patient 03/18/12 1534      Chief Complaint  Patient presents with  . Hallucinations    (Consider location/radiation/quality/duration/timing/severity/associated sxs/prior treatment) HPI Pt with recent admission for tactile hallucinations (felt like stuff was crawling on her skin, growing out of her head, etc), but no auditory or command hallucinations reports she was still having symptoms and unable to sleep even with Seroquel which she has since stopped. She was seen at PCP office yesterday for same who recommended outpatient psychiatric evaluation but advised her to come to the [facility] ED to expedite that process. Pt now states she no longer has any hallucinations but is fixated on what she perceives to be 'bad things' she did as a younger woman. She admits to severe insomnia. Denies any somatic complaints. Denies SI/HI. She does not want to stay in the ED for psychiatric evaluation.   Past Medical History  Diagnosis Date  . Hyperlipidemia   . Hypertension   . Chronic kidney disease   . Normal cardiac stress test 12/24/2009    lexiscan, imaging normal  . 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 Diverticulitis   . IBS (irritable bowel syndrome)   . Thyroid disease   . Depression     Past Surgical History  Procedure Date  . Cardiac catheterization 2003    normal  . Cholecystectomy     Open mid line incision  . Frontal craniotomy 2002    indication = sinusitis  . Appendectomy   . Tubal ligation   . Abdominal hysterectomy     History reviewed. No pertinent family history.  History  Substance Use Topics  . Smoking status: Current Every Day Smoker -- 1.0 packs/day for 60 years    Types: Cigarettes  . Smokeless tobacco: Never Used  . Alcohol Use: No    OB History    Grav Para Term Preterm Abortions TAB SAB Ect Mult Living   5 3 3                Review of Systems All other systems reviewed and are negative except as noted in HPI.   Allergies  Codeine; Penicillins; Pregabalin; and Sulfamethoxazole  Home Medications   Current Outpatient Rx  Name Route Sig Dispense Refill  . AMLODIPINE BESYLATE 10 MG PO TABS Oral Take 1 tablet (10 mg total) by mouth daily. 30 tablet 11  . ASPIRIN 81 MG PO CHEW Oral Chew 81 mg by mouth daily.     . ATORVASTATIN CALCIUM 40 MG PO TABS Oral Take 1 tablet (40 mg total) by mouth daily. 30 tablet 11  . CLONIDINE HCL 0.1 MG PO TABS Oral Take 1 tablet (0.1 mg total) by mouth 3 (three) times daily. 90 tablet 11  . FLUTICASONE PROPIONATE 50 MCG/ACT NA SUSP Nasal Place 2 sprays into the nose daily.    Marland Kitchen HYDRALAZINE HCL 50 MG PO TABS Oral Take 1 tablet (50 mg total) by mouth 3 (three) times daily. 90 tablet 11  . METOPROLOL TARTRATE 100 MG PO TABS Oral Take 100 mg by mouth 2 (two) times daily.    Marland Kitchen OMEPRAZOLE 20 MG PO TBEC Oral Take 1 tablet (20 mg total) by mouth daily. 30 each 11  . POLYETHYLENE GLYCOL 3350 PO POWD Oral Take 17 g by mouth 2 (two) times daily. 3350 g 1  .  SENNA-DOCUSATE SODIUM 8.6-50 MG PO TABS Oral Take 2 tablets by mouth daily. 60 tablet 0    BP 163/67  Pulse 75  Temp 98.7 F (37.1 C) (Oral)  Resp 20  Ht 5\' 2"  (1.575 m)  Wt 148 lb (67.132 kg)  BMI 27.07 kg/m2  SpO2 100%  Physical Exam  Constitutional: She is oriented to person, place, and time. She appears well-developed and well-nourished.  HENT:  Head: Normocephalic and atraumatic.  Eyes: Pupils are equal, round, and reactive to light.  Neck: Neck supple.  Pulmonary/Chest: Effort normal.  Musculoskeletal: Normal range of motion.  Neurological: She is alert and oriented to person, place, and time. No cranial nerve deficit.  Skin: Skin is warm.  Psychiatric:       Flat affect, tearful at times, but has insight into her symptoms    ED Course  Procedures (including critical care time)  Labs Reviewed - No data to  display No results found.   No diagnosis found.    MDM  Pt requesting a sleep aid, will Rx short course of Ambien. Also requesting outpatient psychiatry resources. She does not meet inpatient psychiatric admission criteria. Does not want to stay in the ED. Ready for discharge.        Charles B. Bernette Mayers, MD 03/18/12 [phone]

## 2012-03-19 ENCOUNTER — Telehealth: Payer: Self-pay | Admitting: Family Medicine

## 2012-03-19 ENCOUNTER — Emergency Department (HOSPITAL_COMMUNITY)
Admission: EM | Admit: 2012-03-19 | Discharge: 2012-03-19 | Disposition: A | Discharge status: Home / Self Care | Payer: Medicare Other | Attending: Emergency Medicine | Admitting: Emergency Medicine

## 2012-03-19 ENCOUNTER — Encounter (HOSPITAL_COMMUNITY): Payer: Self-pay | Admitting: *Deleted

## 2012-03-19 DIAGNOSIS — I129 Hypertensive chronic kidney disease with stage 1 through stage 4 chronic kidney disease, or unspecified chronic kidney disease: Secondary | ICD-10-CM | POA: Diagnosis not present

## 2012-03-19 DIAGNOSIS — F17200 Nicotine dependence, unspecified, uncomplicated: Secondary | ICD-10-CM | POA: Diagnosis not present

## 2012-03-19 DIAGNOSIS — D649 Anemia, unspecified: Secondary | ICD-10-CM | POA: Diagnosis not present

## 2012-03-19 DIAGNOSIS — E079 Disorder of thyroid, unspecified: Secondary | ICD-10-CM | POA: Diagnosis not present

## 2012-03-19 DIAGNOSIS — G4700 Insomnia, unspecified: Secondary | ICD-10-CM

## 2012-03-19 DIAGNOSIS — N189 Chronic kidney disease, unspecified: Secondary | ICD-10-CM | POA: Diagnosis not present

## 2012-03-19 DIAGNOSIS — E785 Hyperlipidemia, unspecified: Secondary | ICD-10-CM | POA: Insufficient documentation

## 2012-03-19 MED ORDER — CLONAZEPAM 0.5 MG PO TABS: 0.5000 mg | ORAL_TABLET | Freq: Every day | ORAL | Status: DC

## 2012-03-19 MED ORDER — SERTRALINE HCL 50 MG PO TABS: 25.0000 mg | ORAL_TABLET | Freq: Every morning | ORAL | Status: DC

## 2012-03-19 NOTE — ED Notes (Signed)
Pt has waited for tele-psy consult for 2 hours now and is getting impatience. Specialist on call called and said it would be at least one more hour.

## 2012-03-19 NOTE — ED Notes (Signed)
Dr. Juleen China aware.

## 2012-03-19 NOTE — ED Notes (Signed)
Pt's sister states "she was here yesterday, was referred to [facility] by FP, Dr. Caryn Section is supposed to be setting up Fe transfusions but @ [facility], she needs to see a psychiatrist";  Pt states, "Evette Georges mistress, Renell Hunter bothers me, in the past 2 days I have figured out that person is me, I am saddened by things I did when I was younger, it haunts me, her situation and Evette Georges situation, and I can't stop the thoughts, I took the half of Ambien Dr. Bernette Mayers gave me yesterday and it did nothing, I haven't slept in 2 weeks, been awake a week or two";

## 2012-03-19 NOTE — ED Notes (Signed)
Tele-psy called.  

## 2012-03-19 NOTE — ED Notes (Signed)
Tele-psy completed, pending for result.

## 2012-03-19 NOTE — ED Notes (Signed)
Pt in tele-psy consult 

## 2012-03-19 NOTE — ED Notes (Signed)
Pt pending for tele-psy

## 2012-03-19 NOTE — ED Provider Notes (Signed)
History    71yf presenting with daughter. Hallucinations and thoughts about being like "Renell Durene Cal" because of things she did when she was younger. Thoughts keeping her up at night. Says hallucinations of insects crawling on her back have improved, but her sleep has not. No SI or HI. Pt referred to psychiatrist but hasn't seen yet. Feels like she needs to see someone immediately. Prescribed ambien on last ED visit. Has tried taking 2.5mg  but still can't sleep.   CSN: [id number]  Arrival date & time 03/19/12  1229   First MD Initiated Contact with Patient 03/19/12 1258      No chief complaint on file.   (Consider location/radiation/quality/duration/timing/severity/associated sxs/prior treatment) HPI  Past Medical History  Diagnosis Date  . Hyperlipidemia   . Hypertension   . Chronic kidney disease   . Normal cardiac stress test 12/24/2009    lexiscan, imaging normal  . 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 Diverticulitis   . IBS (irritable bowel syndrome)   . Thyroid disease   . Depression     Past Surgical History  Procedure Date  . Cardiac catheterization 2003    normal  . Cholecystectomy     Open mid line incision  . Frontal craniotomy 2002    indication = sinusitis  . Appendectomy   . Tubal ligation   . Abdominal hysterectomy     No family history on file.  History  Substance Use Topics  . Smoking status: Current Every Day Smoker -- 1.0 packs/day for 60 years    Types: Cigarettes  . Smokeless tobacco: Never Used  . Alcohol Use: No    OB History    Grav Para Term Preterm Abortions TAB SAB Ect Mult Living   5 3 3              Review of Systems   Review of symptoms negative unless otherwise noted in HPI.   Allergies  Codeine; Penicillins; Pregabalin; and Sulfamethoxazole  Home Medications   Current Outpatient Rx  Name Route Sig Dispense Refill  . AMLODIPINE BESYLATE 10 MG PO TABS Oral Take 1 tablet (10  mg total) by mouth daily. 30 tablet 11  . ASPIRIN 81 MG PO CHEW Oral Chew 81 mg by mouth daily.     . ATORVASTATIN CALCIUM 40 MG PO TABS Oral Take 1 tablet (40 mg total) by mouth daily. 30 tablet 11  . CLONIDINE HCL 0.1 MG PO TABS Oral Take 1 tablet (0.1 mg total) by mouth 3 (three) times daily. 90 tablet 11  . FLUTICASONE PROPIONATE 50 MCG/ACT NA SUSP Nasal Place 2 sprays into the nose daily.    Marland Kitchen HYDRALAZINE HCL 50 MG PO TABS Oral Take 1 tablet (50 mg total) by mouth 3 (three) times daily. 90 tablet 11  . METOPROLOL TARTRATE 100 MG PO TABS Oral Take 100 mg by mouth 2 (two) times daily.    Marland Kitchen OMEPRAZOLE 20 MG PO TBEC Oral Take 1 tablet (20 mg total) by mouth daily. 30 each 11  . POLYETHYLENE GLYCOL 3350 PO POWD Oral Take 17 g by mouth 2 (two) times daily. 3350 g 1  . SENNA-DOCUSATE SODIUM 8.6-50 MG PO TABS Oral Take 2 tablets by mouth daily. 60 tablet 0  . ZOLPIDEM TARTRATE 5 MG PO TABS Oral Take 0.5 tablets (2.5 mg total) by mouth at bedtime as needed for sleep. 5 tablet 0    BP 166/75  Pulse 71  Temp 98 F (36.7 C) (Oral)  Resp 18  Wt 148 lb (67.132 kg)  SpO2 100%  Physical Exam  Nursing note and vitals reviewed. Constitutional: She appears well-developed and well-nourished. No distress.  HENT:  Head: Normocephalic and atraumatic.  Eyes: Conjunctivae normal are normal. Right eye exhibits no discharge. Left eye exhibits no discharge.  Neck: Neck supple.  Cardiovascular: Normal rate, regular rhythm and normal heart sounds.  Exam reveals no gallop and no friction rub.   No murmur heard. Pulmonary/Chest: Effort normal and breath sounds normal. No respiratory distress.  Abdominal: Soft. She exhibits no distension. There is no tenderness.  Musculoskeletal: She exhibits no edema and no tenderness.  Neurological: She is alert.  Skin: Skin is warm and dry.  Psychiatric: She has a normal mood and affect. Her behavior is normal. Thought content normal.       Speech clear and content  appropriate. Decent insight.    ED Course  Procedures (including critical care time)  Labs Reviewed - No data to display No results found.   1. Insomnia       MDM  71yF with improving hallucinations and insomnia. Psychiatry consulted for medication recommendations. No criteria for inpatient treatment. Plan discharge when consult completed with meds/follow-up per psych recs.       Raeford Razor, MD 03/20/12 [phone]

## 2012-03-19 NOTE — ED Notes (Signed)
Patient is alert, oriented. Has appropriate judgement, and coherent thought process, good insight. Said "There was a man named Nanda Quinton who was running for president. He was a married man but had an affair with a woman named Scientist, research (physical sciences). Corey Skains wife later died of cancer. I resemble Renell Hunter a lot and that made me feel very depress". Pt later sts that when she was younger, she was in a relationship with a married man and his wife later also died of cancer just like Jonny Ruiz Edward's wife. Therefore, Corey Skains story reminds her a lot of her own story. Pt is here for depression and is seeking help for depression.

## 2012-03-19 NOTE — Telephone Encounter (Signed)
Received call from sister.  Went to [facility] ED and was seen by telepsych.  Started on klonopin and zoloft.  Patient took one klonopin earlier tody and slept for about .  Now up and pacing with racing thoughts and intermitent tearfulness.  Rec that she try an additional dose of klonopin (0.5mg ) for agitation/sleep before thinking about bringing back to ED.  Also told sister that I filled out the paperwork that was involved with applying for sitter today.

## 2012-03-19 NOTE — ED Provider Notes (Signed)
Will d/c with rx for zoloft and klonopin as recommended by psychiatrist.   Loren Racer, MD 03/19/12 [phone]

## 2012-03-19 NOTE — ED Notes (Signed)
Pt is requesting for tele-psy. Will implement.

## 2012-03-23 DIAGNOSIS — F333 Major depressive disorder, recurrent, severe with psychotic symptoms: Secondary | ICD-10-CM | POA: Diagnosis not present

## 2012-03-23 DIAGNOSIS — F4310 Post-traumatic stress disorder, unspecified: Secondary | ICD-10-CM | POA: Diagnosis not present

## 2012-03-24 ENCOUNTER — Telehealth: Payer: Self-pay | Admitting: *Deleted

## 2012-03-24 DIAGNOSIS — F329 Major depressive disorder, single episode, unspecified: Secondary | ICD-10-CM | POA: Diagnosis not present

## 2012-03-24 DIAGNOSIS — N2581 Secondary hyperparathyroidism of renal origin: Secondary | ICD-10-CM | POA: Diagnosis not present

## 2012-03-24 DIAGNOSIS — I120 Hypertensive chronic kidney disease with stage 5 chronic kidney disease or end stage renal disease: Secondary | ICD-10-CM | POA: Diagnosis not present

## 2012-03-24 DIAGNOSIS — N185 Chronic kidney disease, stage 5: Secondary | ICD-10-CM | POA: Diagnosis not present

## 2012-03-24 DIAGNOSIS — I10 Essential (primary) hypertension: Secondary | ICD-10-CM | POA: Diagnosis not present

## 2012-03-24 NOTE — Telephone Encounter (Signed)
Patient completed form to request PCP change.  Form states "Patient does not feel comfortable and request reassignment."  Dr. Louanne Belton states he has discussed with Dr. Deirdre Priest and ok to change to another PCP.  Spoke with patient/daughter [name]) and will change PCP to Dr. Everlene Other.  Gaylene Brooks, RN

## 2012-03-26 DIAGNOSIS — N186 End stage renal disease: Secondary | ICD-10-CM | POA: Diagnosis not present

## 2012-03-26 DIAGNOSIS — D509 Iron deficiency anemia, unspecified: Secondary | ICD-10-CM | POA: Diagnosis not present

## 2012-03-26 DIAGNOSIS — D631 Anemia in chronic kidney disease: Secondary | ICD-10-CM | POA: Diagnosis not present

## 2012-03-26 DIAGNOSIS — N039 Chronic nephritic syndrome with unspecified morphologic changes: Secondary | ICD-10-CM | POA: Diagnosis not present

## 2012-03-26 DIAGNOSIS — N2581 Secondary hyperparathyroidism of renal origin: Secondary | ICD-10-CM | POA: Diagnosis not present

## 2012-03-29 DIAGNOSIS — D509 Iron deficiency anemia, unspecified: Secondary | ICD-10-CM | POA: Diagnosis not present

## 2012-03-29 DIAGNOSIS — N2581 Secondary hyperparathyroidism of renal origin: Secondary | ICD-10-CM | POA: Diagnosis not present

## 2012-03-29 DIAGNOSIS — D631 Anemia in chronic kidney disease: Secondary | ICD-10-CM | POA: Diagnosis not present

## 2012-03-29 DIAGNOSIS — N186 End stage renal disease: Secondary | ICD-10-CM | POA: Diagnosis not present

## 2012-03-30 ENCOUNTER — Encounter (HOSPITAL_COMMUNITY): Payer: Self-pay | Admitting: *Deleted

## 2012-03-30 ENCOUNTER — Ambulatory Visit: Payer: Medicare Other | Admitting: Family Medicine

## 2012-03-30 ENCOUNTER — Emergency Department (HOSPITAL_COMMUNITY)
Admission: EM | Admit: 2012-03-30 | Discharge: 2012-03-30 | Disposition: A | Discharge status: Home / Self Care | Payer: Medicare Other | Attending: Emergency Medicine | Admitting: Emergency Medicine

## 2012-03-30 DIAGNOSIS — Z862 Personal history of diseases of the blood and blood-forming organs and certain disorders involving the immune mechanism: Secondary | ICD-10-CM | POA: Diagnosis not present

## 2012-03-30 DIAGNOSIS — N186 End stage renal disease: Secondary | ICD-10-CM | POA: Insufficient documentation

## 2012-03-30 DIAGNOSIS — K589 Irritable bowel syndrome without diarrhea: Secondary | ICD-10-CM | POA: Diagnosis not present

## 2012-03-30 DIAGNOSIS — F17200 Nicotine dependence, unspecified, uncomplicated: Secondary | ICD-10-CM | POA: Diagnosis not present

## 2012-03-30 DIAGNOSIS — R55 Syncope and collapse: Secondary | ICD-10-CM | POA: Diagnosis not present

## 2012-03-30 DIAGNOSIS — R404 Transient alteration of awareness: Secondary | ICD-10-CM | POA: Diagnosis not present

## 2012-03-30 DIAGNOSIS — K5732 Diverticulitis of large intestine without perforation or abscess without bleeding: Secondary | ICD-10-CM | POA: Diagnosis not present

## 2012-03-30 DIAGNOSIS — D509 Iron deficiency anemia, unspecified: Secondary | ICD-10-CM | POA: Diagnosis not present

## 2012-03-30 DIAGNOSIS — E079 Disorder of thyroid, unspecified: Secondary | ICD-10-CM | POA: Diagnosis not present

## 2012-03-30 DIAGNOSIS — I129 Hypertensive chronic kidney disease with stage 1 through stage 4 chronic kidney disease, or unspecified chronic kidney disease: Secondary | ICD-10-CM | POA: Diagnosis not present

## 2012-03-30 DIAGNOSIS — F329 Major depressive disorder, single episode, unspecified: Secondary | ICD-10-CM | POA: Diagnosis not present

## 2012-03-30 DIAGNOSIS — Z79899 Other long term (current) drug therapy: Secondary | ICD-10-CM | POA: Insufficient documentation

## 2012-03-30 DIAGNOSIS — Z7982 Long term (current) use of aspirin: Secondary | ICD-10-CM | POA: Insufficient documentation

## 2012-03-30 DIAGNOSIS — N039 Chronic nephritic syndrome with unspecified morphologic changes: Secondary | ICD-10-CM | POA: Diagnosis not present

## 2012-03-30 DIAGNOSIS — F3289 Other specified depressive episodes: Secondary | ICD-10-CM | POA: Insufficient documentation

## 2012-03-30 DIAGNOSIS — N2581 Secondary hyperparathyroidism of renal origin: Secondary | ICD-10-CM | POA: Diagnosis not present

## 2012-03-30 DIAGNOSIS — E785 Hyperlipidemia, unspecified: Secondary | ICD-10-CM | POA: Diagnosis not present

## 2012-03-30 DIAGNOSIS — R42 Dizziness and giddiness: Secondary | ICD-10-CM

## 2012-03-30 DIAGNOSIS — I120 Hypertensive chronic kidney disease with stage 5 chronic kidney disease or end stage renal disease: Secondary | ICD-10-CM | POA: Diagnosis not present

## 2012-03-30 LAB — COMPREHENSIVE METABOLIC PANEL
ALT: 5 U/L (ref 0–35)
AST: 12 U/L (ref 0–37)
Calcium: 9.3 mg/dL (ref 8.4–10.5)
Creatinine, Ser: 3.13 mg/dL — ABNORMAL HIGH (ref 0.50–1.10)
GFR calc Af Amer: 16 mL/min — ABNORMAL LOW (ref >90)
Sodium: 138 mEq/L (ref 135–145)
Total Protein: 6.6 g/dL (ref 6.0–8.3)

## 2012-03-30 LAB — CBC WITH DIFFERENTIAL/PLATELET
Basophils Absolute: 0 10*3/uL (ref 0.0–0.1)
Eosinophils Absolute: 0.1 10*3/uL (ref 0.0–0.7)
Eosinophils Relative: 1 % (ref 0–5)
MCH: 28.1 pg (ref 26.0–34.0)
MCHC: 31.6 g/dL (ref 30.0–36.0)
MCV: 88.9 fL (ref 78.0–100.0)
Platelets: 193 10*3/uL (ref 150–400)
RDW: 17.1 % — ABNORMAL HIGH (ref 11.5–15.5)

## 2012-03-30 LAB — TROPONIN I: Troponin I: <0.3 ng/mL (ref <0.30)

## 2012-03-30 NOTE — ED Provider Notes (Signed)
History     CSN: [id number]  Arrival date & time 03/30/12  1630   None     Chief Complaint  Patient presents with  . Dizziness    (Consider location/radiation/quality/duration/timing/severity/associated sxs/prior treatment) HPI Comments: Patient recently started hemodialysis.  After second session today, she had an episode of lightheadedness and felt like she was going to pass out.  She denies losing postural tone or completely blacking out.  Patient is a 71 y.o. female presenting with weakness and general illness. The history is provided by the patient and a relative. No language interpreter was used.  Weakness The primary symptoms include altered mental status (transient), dizziness (light heaeded) and nausea (resolved). Primary symptoms do not include headaches, syncope, loss of consciousness, seizures, visual change, focal weakness, fever or vomiting. The symptoms began 1 to 2 hours ago. The episode lasted 1 hour.  Dizziness also occurs with nausea (resolved) and weakness (generalized, resolved). Dizziness does not occur with vomiting.  Nausea began today.  Additional symptoms include weakness (generalized, resolved). Additional symptoms do not include neck stiffness, pain, lower back pain, hallucinations or vertigo. Medical issues also include hypertension. Medical issues do not include seizures, cerebral vascular accident or cancer. Workup history does not include MRI or EEG.  Illness  The current episode started today. The onset was gradual. The problem occurs rarely. The problem has been resolved. The problem is moderate. Nothing relieves the symptoms. Nothing aggravates the symptoms. Associated symptoms include nausea (resolved). Pertinent negatives include no fever, no abdominal pain, no diarrhea, no vomiting, no congestion, no headaches, no rhinorrhea, no stridor, no neck pain, no cough, no wheezing and no eye discharge.    Past Medical History  Diagnosis Date  .  Hyperlipidemia   . Hypertension   . Chronic kidney disease   . Normal cardiac stress test 12/24/2009    lexiscan, imaging normal  . 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 Diverticulitis   . IBS (irritable bowel syndrome)   . Thyroid disease   . Depression     Past Surgical History  Procedure Date  . Cardiac catheterization 2003    normal  . Cholecystectomy     Open mid line incision  . Frontal craniotomy 2002    indication = sinusitis  . Appendectomy   . Tubal ligation   . Abdominal hysterectomy     No family history on file.  History  Substance Use Topics  . Smoking status: Current Every Day Smoker -- 1.0 packs/day for 60 years    Types: Cigarettes  . Smokeless tobacco: Never Used  . Alcohol Use: No    OB History    Grav Para Term Preterm Abortions TAB SAB Ect Mult Living   5 3 3              Review of Systems  Constitutional: Negative for fever, chills, activity change and appetite change.  HENT: Negative for congestion, rhinorrhea, neck pain, neck stiffness and sinus pressure.   Eyes: Negative for discharge and visual disturbance.  Respiratory: Negative for cough, chest tightness, shortness of breath, wheezing and stridor.   Cardiovascular: Negative for chest pain, leg swelling and syncope.  Gastrointestinal: Positive for nausea (resolved). Negative for vomiting, abdominal pain, diarrhea and abdominal distention.  Genitourinary: Negative for decreased urine volume and difficulty urinating.  Musculoskeletal: Negative for back pain and arthralgias.  Skin: Negative for color change and pallor.  Neurological: Positive for dizziness (light heaeded),  weakness (generalized, resolved) and light-headedness. Negative for vertigo, focal weakness, seizures, loss of consciousness and headaches.  Psychiatric/Behavioral: Positive for altered mental status (transient). Negative for hallucinations, behavioral problems and agitation.  All  other systems reviewed and are negative.    Allergies  Codeine; Penicillins; Pregabalin; and Sulfamethoxazole  Home Medications   Current Outpatient Rx  Name Route Sig Dispense Refill  . ACETAMINOPHEN 500 MG PO TABS Oral Take 500 mg by mouth every 6 (six) hours as needed. For pain    . ALLOPURINOL 100 MG PO TABS Oral Take 100 mg by mouth daily.    Marland Kitchen AMLODIPINE BESYLATE 10 MG PO TABS Oral Take 1 tablet (10 mg total) by mouth daily. 30 tablet 11  . ARIPIPRAZOLE 5 MG PO TABS Oral Take 2.5 mg by mouth daily.    . ASPIRIN 81 MG PO CHEW Oral Chew 81 mg by mouth daily.     . ATORVASTATIN CALCIUM 40 MG PO TABS Oral Take 1 tablet (40 mg total) by mouth daily. 30 tablet 11  . CLONAZEPAM 0.5 MG PO TABS Oral Take 1 tablet (0.5 mg total) by mouth at bedtime. 30 tablet 0  . CLONIDINE HCL 0.1 MG PO TABS Oral Take 1 tablet (0.1 mg total) by mouth 3 (three) times daily. 90 tablet 11  . HYDRALAZINE HCL 50 MG PO TABS Oral Take 1 tablet (50 mg total) by mouth 3 (three) times daily. 90 tablet 11  . METOPROLOL TARTRATE 100 MG PO TABS Oral Take 100 mg by mouth 2 (two) times daily.    Marland Kitchen MIRTAZAPINE 7.5 MG PO TABS Oral Take 3.75 mg by mouth at bedtime.    . OMEPRAZOLE 20 MG PO CPDR Oral Take 20 mg by mouth daily.    Marland Kitchen MIRTAZAPINE 15 MG PO TABS Oral Take 1 tablet (15 mg total) by mouth at bedtime. 30 tablet 0    BP 133/54  Pulse 77  Temp 97.4 F (36.3 C) (Oral)  Resp 18  SpO2 97%  Physical Exam  Nursing note and vitals reviewed. Constitutional: She is oriented to person, place, and time. She appears well-developed and well-nourished. No distress.  HENT:  Head: Normocephalic and atraumatic.  Mouth/Throat: No oropharyngeal exudate.  Eyes: EOM are normal. Pupils are equal, round, and reactive to light. Right eye exhibits no discharge. Left eye exhibits no discharge.  Neck: Normal range of motion. Neck supple. No JVD present.  Cardiovascular: Normal rate, regular rhythm and normal heart sounds.     Pulmonary/Chest: Effort normal and breath sounds normal. No stridor. No respiratory distress. She exhibits no tenderness.  Abdominal: Soft. Bowel sounds are normal. She exhibits no distension. There is no tenderness. There is no guarding.  Musculoskeletal: Normal range of motion. She exhibits tenderness (over AVF of RUE at site of access). She exhibits no edema.  Neurological: She is alert and oriented to person, place, and time. No cranial nerve deficit. She exhibits normal muscle tone.  Skin: Skin is warm and dry. No rash noted. She is not diaphoretic.  Psychiatric: She has a normal mood and affect. Her behavior is normal. Judgment and thought content normal.    ED Course  Procedures (including critical care time)  Labs Reviewed  CBC WITH DIFFERENTIAL - Abnormal; Notable for the following:    RBC 3.42 (*)     Hemoglobin 9.6 (*)     HCT 30.4 (*)     RDW 17.1 (*)     All other components within normal limits  COMPREHENSIVE METABOLIC  PANEL - Abnormal; Notable for the following:    Potassium 3.1 (*)     Creatinine, Ser 3.13 (*)     Albumin 2.9 (*)     GFR calc non Af Amer 14 (*)     GFR calc Af Amer 16 (*)     All other components within normal limits  TROPONIN I   No results found.   1. Pre-syncope   2. Light headedness   3. End stage renal disease     EKG reviewed by me. Pt had some mild ST depression in V5-6 and inf leads that was c/w prior EKG. It was slightly more depressed, repeat EKG was unchanged. No concerning ST elevation or arrhythmias.  MDM  Clinical picture consistent with presyncopal episode.  This is likely related to just being dialyzed.  I spoke with the on-call nephrologist who stated that the patient was likely intravascularly depleted and had yet to equilibrate.  She responded well to a 500 cc normal saline bolus.  She was asymptomatic while in the department.  I doubt this was related to a central nervous system problem or a stroke.  I doubt this is related  to acute coronary syndrome, her EKG was reviewed and the ST depression mention above was noted.  These changes were not transient, repeat EKG was consistent with a prior one.  She had 2 sets of troponins are negative.  She never complained of any chest pain or shortness of breath or any other symptoms worrisome for ACS.  I instructed patient to continue to hold her blood pressure medication and to followup with her dialysis appointment tomorrow morning.  The patient agreed with this plan and was discharged in stable condition without any symptoms. Pt deemed stable for discharge. Return precautions were provided and pt expressed understanding to return to ED if any acute symptoms return. Follow up was instructed which pt also expressed understanding. All questions were answered and pt was in agreement w/ plan.         Warrick Parisian, MD 03/30/12 2055

## 2012-03-30 NOTE — ED Provider Notes (Signed)
I saw and evaluated the patient, reviewed the resident's note and I agree with the findings and plan.   Loren Racer, MD 03/30/12 [phone]

## 2012-03-30 NOTE — ED Notes (Signed)
The pt completed dialysis earlier today and when at home she developed dizziness and feeling lightheaded.  No  n or v.  This was her 2nd dialysis treatment today.  Iv per ems no fluids given initial bp was 90/30 but after being laid flat her bp went up to 130/.  Alert on arrival   No distress

## 2012-03-30 NOTE — ED Notes (Signed)
The pt  Is alert  Skin warm and dry no distress she has a shunt in her rt arm..  She had finished dialysis and was sitting in the car while her daughter went into a store

## 2012-03-30 NOTE — ED Notes (Signed)
Rx x 0.  Pt voiced understanding to f/u with PCP and return for worsening condition.  

## 2012-03-31 DIAGNOSIS — D631 Anemia in chronic kidney disease: Secondary | ICD-10-CM | POA: Diagnosis not present

## 2012-03-31 DIAGNOSIS — N2581 Secondary hyperparathyroidism of renal origin: Secondary | ICD-10-CM | POA: Diagnosis not present

## 2012-03-31 DIAGNOSIS — D509 Iron deficiency anemia, unspecified: Secondary | ICD-10-CM | POA: Diagnosis not present

## 2012-03-31 DIAGNOSIS — N186 End stage renal disease: Secondary | ICD-10-CM | POA: Diagnosis not present

## 2012-04-01 ENCOUNTER — Other Ambulatory Visit (HOSPITAL_COMMUNITY): Payer: Self-pay | Admitting: *Deleted

## 2012-04-02 ENCOUNTER — Encounter (HOSPITAL_COMMUNITY): Payer: Medicare Other

## 2012-04-02 DIAGNOSIS — D509 Iron deficiency anemia, unspecified: Secondary | ICD-10-CM | POA: Diagnosis not present

## 2012-04-02 DIAGNOSIS — N2581 Secondary hyperparathyroidism of renal origin: Secondary | ICD-10-CM | POA: Diagnosis not present

## 2012-04-02 DIAGNOSIS — D631 Anemia in chronic kidney disease: Secondary | ICD-10-CM | POA: Diagnosis not present

## 2012-04-02 DIAGNOSIS — N186 End stage renal disease: Secondary | ICD-10-CM | POA: Diagnosis not present

## 2012-04-06 DIAGNOSIS — F333 Major depressive disorder, recurrent, severe with psychotic symptoms: Secondary | ICD-10-CM | POA: Diagnosis not present

## 2012-04-06 DIAGNOSIS — F4310 Post-traumatic stress disorder, unspecified: Secondary | ICD-10-CM | POA: Diagnosis not present

## 2012-04-08 ENCOUNTER — Encounter: Payer: Self-pay | Admitting: Family Medicine

## 2012-04-08 ENCOUNTER — Ambulatory Visit (INDEPENDENT_AMBULATORY_CARE_PROVIDER_SITE_OTHER): Payer: Medicare Other | Admitting: Family Medicine

## 2012-04-08 VITALS — BP 153/55 | HR 79 | Temp 98.5°F | Wt 134.0 lb

## 2012-04-08 DIAGNOSIS — K219 Gastro-esophageal reflux disease without esophagitis: Secondary | ICD-10-CM

## 2012-04-08 DIAGNOSIS — F411 Generalized anxiety disorder: Secondary | ICD-10-CM | POA: Diagnosis not present

## 2012-04-08 DIAGNOSIS — R443 Hallucinations, unspecified: Secondary | ICD-10-CM

## 2012-04-08 DIAGNOSIS — F329 Major depressive disorder, single episode, unspecified: Secondary | ICD-10-CM | POA: Diagnosis not present

## 2012-04-08 DIAGNOSIS — K5900 Constipation, unspecified: Secondary | ICD-10-CM | POA: Diagnosis not present

## 2012-04-08 DIAGNOSIS — I10 Essential (primary) hypertension: Secondary | ICD-10-CM | POA: Diagnosis not present

## 2012-04-08 MED ORDER — OMEPRAZOLE 20 MG PO CPDR: 40.0000 mg | DELAYED_RELEASE_CAPSULE | Freq: Every day | ORAL | Status: DC

## 2012-04-08 NOTE — Assessment & Plan Note (Signed)
Patient to continue using Miralax (patient just started today). Patient instructed to use once to twice a day.

## 2012-04-08 NOTE — Assessment & Plan Note (Signed)
Prilosec increased to 40 mg daily.

## 2012-04-08 NOTE — Patient Instructions (Addendum)
It was great meeting you today.  I have increased your prilosec to 40 mg daily.  Continue taking sennakot and Miralax for constipation.  You can follow up with me in 3-6 months or earlier if needed.

## 2012-04-08 NOTE — Assessment & Plan Note (Signed)
Improving with Remeron and Abilify. Will reassess at next visit via [facility]

## 2012-04-08 NOTE — Assessment & Plan Note (Signed)
Patient reports that her anxiety has resolved with treatment.  Will continue to follow with psychiatry.

## 2012-04-08 NOTE — Assessment & Plan Note (Signed)
Improving with treatment - Abilify.  Psychiatry following.

## 2012-04-08 NOTE — Assessment & Plan Note (Signed)
Will continue current treatment regimen - Clonidine and Metoprolol Nephrology managing.

## 2012-04-08 NOTE — Progress Notes (Signed)
Subjective:     Patient ID: [name], female   DOB: 18-Jan-1941, 71 y.o.   MRN: [id number]  HPI 71 year old lady presents to clinic today to establish care with me.  PMH includes CKD, HTN, HLD, GERD and hiatal hernia. Patient has had recent anxiety, tactile hallucinations, depression, and insomnia (10/17).  Patient is currently seeing a psychiatrist and has noticed marked improvement in her symptoms. Patient reports constipation and issues with GERD today.  1) Hallucinations, depression, anxiety - Patient reports marked improvement.  Patient states that anxiety has abated but she continues to have symptoms of depression (although they are improving as well). - Hallucinations still present but patient states that she is less trouble by them and knows that they are not real. - Patient currently seeing Psychiatry and is on Remeron and Abilify.  2) CKD - [name] has  recently started dialysis and states that is going all right.   - She reports that the nurses have had considerably difficulty getting access and she has had significant hypotension during dialysis as well.    3) HTN - Patient's Norvasc and Hydralazine recently D/C by Nephrology - Patient taking Clonidine and Metoprolol (frequency decreased to once a day on dialysis days - MWF) - Patient denies CP, Headache, Dizziness/lightheadedness, lower extremity swelling.  4) HLD - Currently controlled on Lipitor.  Last LDL was 76 (08/2011)  5) Constipation - Reports feeling bloated and states she has not had a BM in 1-2 weeks - Started Miralax and senna today.  6) GERD - Reports heartburn and frequent belching despite maalox and Prilosec.  Review of Systems See HPI    Objective:   Physical Exam Filed Vitals:   04/08/12 1503  BP: 153/55  Pulse: 79  Temp: 98.5 F (36.9 C)   General: well appearing elderly female in NAD. Neck: Supple. No adenopathy CV: RRR. No murmurs, rubs, or gallops. Lungs: CTAB. No rales, rhonchi, or  wheeze. Abd: soft, nontender. Nondistended. No organomegaly appreciated. Extremities: no edema noted. Skin: no rash or lesions noted. Psych: normal mood and affect.  Normal speech and thought process. Neuro: AO x 3. No focal deficits.     Assessment:       Plan:

## 2012-04-12 ENCOUNTER — Other Ambulatory Visit (HOSPITAL_COMMUNITY): Payer: Self-pay | Admitting: Nephrology

## 2012-04-12 DIAGNOSIS — N186 End stage renal disease: Secondary | ICD-10-CM

## 2012-04-13 ENCOUNTER — Encounter (HOSPITAL_COMMUNITY): Payer: Self-pay | Admitting: Pharmacist

## 2012-04-13 ENCOUNTER — Ambulatory Visit (HOSPITAL_COMMUNITY)
Admission: RE | Admit: 2012-04-13 | Discharge: 2012-04-13 | Disposition: A | Discharge status: Home / Self Care | Payer: Medicare Other | Source: Ambulatory Visit | Attending: Nephrology | Admitting: Nephrology

## 2012-04-13 ENCOUNTER — Other Ambulatory Visit (HOSPITAL_COMMUNITY): Payer: Self-pay | Admitting: Nephrology

## 2012-04-13 DIAGNOSIS — T82898A Other specified complication of vascular prosthetic devices, implants and grafts, initial encounter: Secondary | ICD-10-CM | POA: Insufficient documentation

## 2012-04-13 DIAGNOSIS — N186 End stage renal disease: Secondary | ICD-10-CM

## 2012-04-13 DIAGNOSIS — Y832 Surgical operation with anastomosis, bypass or graft as the cause of abnormal reaction of the patient, or of later complication, without mention of misadventure at the time of the procedure: Secondary | ICD-10-CM | POA: Insufficient documentation

## 2012-04-13 DIAGNOSIS — I871 Compression of vein: Secondary | ICD-10-CM | POA: Diagnosis not present

## 2012-04-13 MED ORDER — IOHEXOL 300 MG/ML  SOLN
100.0000 mL | Freq: Once | INTRAMUSCULAR | Status: AC | PRN
  Administered 2012-04-13: 40 mL via INTRAVENOUS

## 2012-04-13 NOTE — Procedures (Signed)
AV fistulagram Chronic occlusion. No comp

## 2012-04-14 ENCOUNTER — Ambulatory Visit (HOSPITAL_COMMUNITY)
Admission: RE | Admit: 2012-04-14 | Discharge: 2012-04-14 | Disposition: A | Discharge status: Home / Self Care | Payer: Medicare Other | Source: Ambulatory Visit | Attending: Nephrology | Admitting: Nephrology

## 2012-04-14 ENCOUNTER — Other Ambulatory Visit (HOSPITAL_COMMUNITY): Payer: Self-pay | Admitting: Nephrology

## 2012-04-14 ENCOUNTER — Other Ambulatory Visit: Payer: Self-pay | Admitting: Family Medicine

## 2012-04-14 DIAGNOSIS — N186 End stage renal disease: Secondary | ICD-10-CM

## 2012-04-14 DIAGNOSIS — E785 Hyperlipidemia, unspecified: Secondary | ICD-10-CM | POA: Diagnosis not present

## 2012-04-14 DIAGNOSIS — Z992 Dependence on renal dialysis: Secondary | ICD-10-CM | POA: Diagnosis not present

## 2012-04-14 DIAGNOSIS — Z4901 Encounter for fitting and adjustment of extracorporeal dialysis catheter: Secondary | ICD-10-CM | POA: Diagnosis not present

## 2012-04-14 DIAGNOSIS — I120 Hypertensive chronic kidney disease with stage 5 chronic kidney disease or end stage renal disease: Secondary | ICD-10-CM | POA: Diagnosis not present

## 2012-04-14 LAB — APTT: aPTT: 34 seconds (ref 24–37)

## 2012-04-14 LAB — CBC
HCT: 29.8 % — ABNORMAL LOW (ref 36.0–46.0)
Hemoglobin: 9.4 g/dL — ABNORMAL LOW (ref 12.0–15.0)
RBC: 3.27 MIL/uL — ABNORMAL LOW (ref 3.87–5.11)
WBC: 8.5 10*3/uL (ref 4.0–10.5)

## 2012-04-14 LAB — PROTIME-INR
INR: 1.1 (ref 0.00–1.49)
Prothrombin Time: 14.1 seconds (ref 11.6–15.2)

## 2012-04-14 MED ORDER — VANCOMYCIN HCL IN DEXTROSE 1-5 GM/200ML-% IV SOLN
1000.0000 mg | INTRAVENOUS | Status: AC
  Administered 2012-04-14: 1000 mg via INTRAVENOUS
  Filled 2012-04-14: qty 200

## 2012-04-14 MED ORDER — MIDAZOLAM HCL 2 MG/2ML IJ SOLN
INTRAMUSCULAR | Status: AC
  Filled 2012-04-14: qty 4

## 2012-04-14 MED ORDER — SODIUM CHLORIDE 0.9 % IV SOLN: INTRAVENOUS | Status: DC

## 2012-04-14 MED ORDER — FENTANYL CITRATE 0.05 MG/ML IJ SOLN
INTRAMUSCULAR | Status: AC
  Filled 2012-04-14: qty 4

## 2012-04-14 MED ORDER — HEPARIN SODIUM (PORCINE) 1000 UNIT/ML IJ SOLN
INTRAMUSCULAR | Status: AC
  Filled 2012-04-14: qty 1

## 2012-04-14 MED ORDER — MIDAZOLAM HCL 2 MG/2ML IJ SOLN
INTRAMUSCULAR | Status: AC | PRN
  Administered 2012-04-14: 0.5 mg via INTRAVENOUS
  Administered 2012-04-14 (×2): 1 mg via INTRAVENOUS
  Administered 2012-04-14: 0.5 mg via INTRAVENOUS

## 2012-04-14 MED ORDER — FENTANYL CITRATE 0.05 MG/ML IJ SOLN
INTRAMUSCULAR | Status: AC | PRN
  Administered 2012-04-14 (×3): 25 ug via INTRAVENOUS
  Administered 2012-04-14 (×2): 50 ug via INTRAVENOUS

## 2012-04-14 MED ORDER — IOHEXOL 300 MG/ML  SOLN
60.0000 mL | Freq: Once | INTRAMUSCULAR | Status: AC | PRN
  Administered 2012-04-14: 25 mL via INTRAVENOUS

## 2012-04-14 NOTE — H&P (Signed)
[name] is an 71 y.o. female.   Chief Complaint: poorly functioning right arm dialysis fistula [id number] with ESRD and recent right arm  fistulagram revealing chronic occlusion of outflow cephalic vein presents today for placement of HD catheter.  Past Medical History  Diagnosis Date  . Hyperlipidemia   . Hypertension   . Chronic kidney disease   . Normal cardiac stress test 12/24/2009    lexiscan, imaging normal  . 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 Diverticulitis   . IBS (irritable bowel syndrome)   . Thyroid disease   . Depression     Past Surgical History  Procedure Date  . Cardiac catheterization 2003    normal  . Cholecystectomy     Open mid line incision  . Frontal craniotomy 2002    indication = sinusitis  . Appendectomy   . Tubal ligation   . Abdominal hysterectomy     No family history on file. Social History:  reports that she has been smoking Cigarettes.  She has a 60 pack-year smoking history. She has never used smokeless tobacco. She reports that she does not drink alcohol or use illicit drugs.  Allergies:  Allergies  Allergen Reactions  . Codeine Other (See Comments)    unknown  . Pregabalin Nausea And Vomiting    confusion  . Penicillins Rash    No problems breathing. Has tolerated omnicef in past without issue  . Sulfamethoxazole Rash    Current outpatient prescriptions:acetaminophen (TYLENOL) 500 MG tablet, Take 500 mg by mouth every 6 (six) hours as needed. For pain, Disp: , Rfl: ;  allopurinol (ZYLOPRIM) 100 MG tablet, Take 100 mg by mouth every morning. , Disp: , Rfl: ;  ARIPiprazole (ABILIFY) 2 MG tablet, Take 1 mg by mouth every morning., Disp: , Rfl: ;  aspirin EC 81 MG tablet, Take 81 mg by mouth every morning., Disp: , Rfl:  atorvastatin (LIPITOR) 40 MG tablet, Take 40 mg by mouth every morning., Disp: , Rfl: ;  Calcium Carbonate Antacid (TUMS PO), Take 1 tablet by mouth daily as needed. For  stomach upset, Disp: , Rfl: ;  cloNIDine (CATAPRES) 0.1 MG tablet, Take 0.1 mg by mouth See admin instructions. Taking twice daily except on MWF once daily, Disp: , Rfl:  lidocaine-prilocaine (EMLA) cream, Apply 1 application topically 3 (three) times a week. Use before dialysis on MWF, Disp: , Rfl: ;  metoprolol (LOPRESSOR) 100 MG tablet, Take 100 mg by mouth See admin instructions. Taking twice daily except on MWF once daily, Disp: , Rfl: ;  mirtazapine (REMERON) 7.5 MG tablet, Take 7.5 mg by mouth at bedtime. , Disp: , Rfl:  omeprazole (PRILOSEC) 20 MG capsule, Take 20 mg by mouth 2 (two) times daily., Disp: , Rfl: ;  polyethylene glycol powder (GLYCOLAX/MIRALAX) powder, Take 17 g by mouth daily as needed. For constipation, Disp: , Rfl: ;  senna (SENOKOT) 8.6 MG tablet, Take 2 tablets by mouth daily as needed. For constipation, Disp: , Rfl:   No results found for this or any previous visit (from the past 48 hour(s)). Ir Shuntogram/fistulagram Right  04/13/2012  *RADIOLOGY REPORT*  Clinical Data: Re-circulating blood during hemodialysis.  AV FISTULAGRAM  Procedure:  The right arm was prepped and draped in a sterile fashion.  An 18 gauge Angiocath was inserted into the outflow vein. Contrast was injected.  The Angiocath was removed.  Hemostasis was achieved with direct pressure.  Findings:  The arteriovenous anastomosis to the cephalic vein in the antecubital fossa is patent.  The outflow vein is chronically occluded in the mid arm.  The brachial and basilic venous system reconstitutes.  Central venous structures are grossly patent.  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 Surgical consultation recommended.   Original Report Authenticated By: Jolaine Click, M.D.   Labs pending  Review of Systems  Constitutional: Negative for fever and chills.  Respiratory: Negative for cough and shortness of breath.   Cardiovascular: Negative for chest pain.  Gastrointestinal: Negative  for nausea, vomiting and abdominal pain.  Musculoskeletal: Negative for back pain.  Neurological: Negative for headaches.    Blood pressure 163/60, pulse 73, temperature 98.3 F (36.8 C), temperature source Oral, resp. rate 18, height 5\' 2"  (1.575 m), weight 134 lb (60.782 kg). Physical Exam  Constitutional: She is oriented to person, place, and time. She appears well-developed and well-nourished.  Cardiovascular: Normal rate and regular rhythm.        Right arm AVF with thrill/bruit  Respiratory: Effort normal.       Distant BS bilat, esp on right  GI: Soft. Bowel sounds are normal. There is no tenderness.  Musculoskeletal: Normal range of motion. She exhibits no edema.  Neurological: She is alert and oriented to person, place, and time.     Assessment/Plan Patient with ESRD and right arm AVF with chronic occlusion of outflow cephalic vein. Plan is for placement of HD catheter today. Details/risks of procedure d/w pt with her understanding and consent.  ALLRED,D KEVIN 04/14/2012, 8:59 AM

## 2012-04-14 NOTE — ED Notes (Signed)
Discharge instructions given to pt and her sister and pt was given a copy.

## 2012-04-14 NOTE — ED Notes (Signed)
Pt taken to passenger side of sister's vehicle, via wheelchair, and placed inside.

## 2012-04-14 NOTE — ED Notes (Signed)
Unable to use R side of neck, pt re-prepped to L side

## 2012-04-14 NOTE — Procedures (Signed)
R IJ and EJ are occluded at the level of the supraclavicular fossa. Successful placement of L IJ approach HD catheter with tip within the superior aspect of the right atrium.   No immediate post procedural complications. HD catheter is ready for immediate use.

## 2012-04-14 NOTE — ED Notes (Signed)
Notify short stay of need for bed

## 2012-04-15 ENCOUNTER — Telehealth: Payer: Self-pay | Admitting: Vascular Surgery

## 2012-04-15 NOTE — Telephone Encounter (Signed)
Message copied by Fredrich Birks on Thu Apr 15, 2012  2:18 PM ------      Message from: Melene Plan      Created: Tue Apr 13, 2012 11:59 AM      Regarding: FW: office visit       She goes to [facility] and I believe it is M,W,F but not sure. [id number] ext [phone]      ----- Message -----         From: Chuck Hint, MD         Sent: 04/13/2012  10:13 AM           To: Melene Plan, RN, Erenest Blank, RN      Subject: office visit                                             This patient had a fistulogram by interventional radiology today. They asked me to look at the films to see if the fistula could be revised. I have reviewed the films and this fistula cannot be revised. He would need to be seen in the office to be evaluated for new access. She will need vein mapping of both upper extremities. I do not know what her dialysis today as I did not see her in consultation.       CSD

## 2012-04-15 NOTE — Telephone Encounter (Signed)
Patient notified of appointment by phone. Dpm

## 2012-04-28 ENCOUNTER — Other Ambulatory Visit: Payer: Self-pay | Admitting: *Deleted

## 2012-04-28 DIAGNOSIS — N186 End stage renal disease: Secondary | ICD-10-CM

## 2012-04-28 DIAGNOSIS — Z01810 Encounter for preprocedural cardiovascular examination: Secondary | ICD-10-CM

## 2012-05-01 DIAGNOSIS — N186 End stage renal disease: Secondary | ICD-10-CM | POA: Diagnosis not present

## 2012-05-03 DIAGNOSIS — D631 Anemia in chronic kidney disease: Secondary | ICD-10-CM | POA: Diagnosis not present

## 2012-05-03 DIAGNOSIS — N186 End stage renal disease: Secondary | ICD-10-CM | POA: Diagnosis not present

## 2012-05-03 DIAGNOSIS — N2581 Secondary hyperparathyroidism of renal origin: Secondary | ICD-10-CM | POA: Diagnosis not present

## 2012-05-03 DIAGNOSIS — D509 Iron deficiency anemia, unspecified: Secondary | ICD-10-CM | POA: Diagnosis not present

## 2012-05-05 ENCOUNTER — Encounter: Payer: Self-pay | Admitting: Vascular Surgery

## 2012-05-06 ENCOUNTER — Ambulatory Visit (INDEPENDENT_AMBULATORY_CARE_PROVIDER_SITE_OTHER): Payer: Medicare Other | Admitting: Vascular Surgery

## 2012-05-06 ENCOUNTER — Encounter: Payer: Self-pay | Admitting: Vascular Surgery

## 2012-05-06 VITALS — BP 175/66 | HR 88 | Ht 62.0 in | Wt 136.7 lb

## 2012-05-06 DIAGNOSIS — N186 End stage renal disease: Secondary | ICD-10-CM

## 2012-05-06 DIAGNOSIS — Z01810 Encounter for preprocedural cardiovascular examination: Secondary | ICD-10-CM

## 2012-05-06 NOTE — Progress Notes (Signed)
71-year-old female referred for evaluation of a right arm AV fistula. The fistula occluded recently. She is currently dialyzing via a left-sided catheter. Her current dialysis days Monday Wednesday and Friday. The fistula was placed in 2012 by Dr. Lawson. She recently had a fistulogram.  Review of systems: She denies shortness of breath. She denies chest pain. She states that her catheter is working well.  Physical exam: Filed Vitals:   05/06/12 1607  BP: 175/66  Pulse: 88  Height: 5' 2" (1.575 m)  Weight: 136 lb 11.2 oz (62.007 kg)  SpO2: 100%   Right upper extremity: Pulsatile fistula right upper arm the pulse transmits all the way to the distal two thirds of the upper arm, no audible bruit  Neck: Left side dialysis catheter no evidence of infection  Data: Patient had a duplex ultrasound today which shows that her right basilic vein is 4-6 mm in diameter. The left basilic vein is short.  Images from the patient's recent fistulogram are reviewed which shows a fistula includes at the level that the pulses palpable with patent central veins  Assessment: Occluded right upper arm fistula. Options include placement of a new fistula which would have her catheter in place for several months. The other possible option would be to replace the distal portion of her right upper arm fistula with a piece of PTFE. This would allow them to start cannulating her fistula again immediately. The downside would be that this would probably have similar patency to a graft but certainly get her catheter out early. She has opted for revision of the right arm fistula.  Plan: See above. Her operation is scheduled for Tuesday, December 10. She would prefer general anesthesia due to anxiety issues.  Mardelle Pandolfi, MD [facility] Office: [phone] Pager: [phone]  

## 2012-05-06 NOTE — Progress Notes (Signed)
Bilateral Upper extremity vein mapping performed @[facility]  05/06/2012

## 2012-05-07 ENCOUNTER — Other Ambulatory Visit: Payer: Self-pay

## 2012-05-07 ENCOUNTER — Encounter (HOSPITAL_COMMUNITY): Payer: Self-pay | Admitting: Pharmacy Technician

## 2012-05-10 ENCOUNTER — Encounter (HOSPITAL_COMMUNITY): Payer: Self-pay | Admitting: *Deleted

## 2012-05-10 MED ORDER — VANCOMYCIN HCL IN DEXTROSE 1-5 GM/200ML-% IV SOLN
1000.0000 mg | INTRAVENOUS | Status: AC
  Administered 2012-05-11: 1000 mg via INTRAVENOUS
  Filled 2012-05-10 (×2): qty 200

## 2012-05-11 ENCOUNTER — Encounter (HOSPITAL_COMMUNITY)
Admission: RE | Disposition: A | Discharge status: Home / Self Care | Payer: Self-pay | Source: Ambulatory Visit | Attending: Vascular Surgery

## 2012-05-11 ENCOUNTER — Ambulatory Visit (HOSPITAL_COMMUNITY): Payer: Medicare Other | Admitting: Anesthesiology

## 2012-05-11 ENCOUNTER — Encounter (HOSPITAL_COMMUNITY): Payer: Self-pay | Admitting: Anesthesiology

## 2012-05-11 ENCOUNTER — Ambulatory Visit (HOSPITAL_COMMUNITY)
Admission: RE | Admit: 2012-05-11 | Discharge: 2012-05-11 | Disposition: A | Discharge status: Home / Self Care | Payer: Medicare Other | Source: Ambulatory Visit | Attending: Vascular Surgery | Admitting: Vascular Surgery

## 2012-05-11 DIAGNOSIS — T82898A Other specified complication of vascular prosthetic devices, implants and grafts, initial encounter: Secondary | ICD-10-CM | POA: Diagnosis not present

## 2012-05-11 DIAGNOSIS — Z992 Dependence on renal dialysis: Secondary | ICD-10-CM | POA: Diagnosis not present

## 2012-05-11 DIAGNOSIS — N186 End stage renal disease: Secondary | ICD-10-CM | POA: Insufficient documentation

## 2012-05-11 DIAGNOSIS — K219 Gastro-esophageal reflux disease without esophagitis: Secondary | ICD-10-CM | POA: Diagnosis not present

## 2012-05-11 DIAGNOSIS — I120 Hypertensive chronic kidney disease with stage 5 chronic kidney disease or end stage renal disease: Secondary | ICD-10-CM | POA: Diagnosis not present

## 2012-05-11 DIAGNOSIS — I10 Essential (primary) hypertension: Secondary | ICD-10-CM | POA: Diagnosis not present

## 2012-05-11 DIAGNOSIS — Y832 Surgical operation with anastomosis, bypass or graft as the cause of abnormal reaction of the patient, or of later complication, without mention of misadventure at the time of the procedure: Secondary | ICD-10-CM | POA: Insufficient documentation

## 2012-05-11 HISTORY — DX: Unspecified osteoarthritis, unspecified site: M19.90

## 2012-05-11 HISTORY — DX: Gastro-esophageal reflux disease without esophagitis: K21.9

## 2012-05-11 HISTORY — PX: REVISON OF ARTERIOVENOUS FISTULA: SHX6074

## 2012-05-11 HISTORY — DX: Personal history of other diseases of the digestive system: Z87.19

## 2012-05-11 LAB — SURGICAL PCR SCREEN: Staphylococcus aureus: NEGATIVE

## 2012-05-11 LAB — POCT I-STAT, CHEM 8
BUN: 20 mg/dL (ref 6–23)
Calcium, Ion: 1.28 mmol/L (ref 1.13–1.30)
Chloride: 96 mEq/L (ref 96–112)
Creatinine, Ser: 4.1 mg/dL — ABNORMAL HIGH (ref 0.50–1.10)
Glucose, Bld: 98 mg/dL (ref 70–99)
HCT: 34 % — ABNORMAL LOW (ref 36.0–46.0)
Hemoglobin: 11.6 g/dL — ABNORMAL LOW (ref 12.0–15.0)
Potassium: 3.2 mEq/L — ABNORMAL LOW (ref 3.5–5.1)
Sodium: 140 mEq/L (ref 135–145)

## 2012-05-11 SURGERY — REVISON OF ARTERIOVENOUS FISTULA
Anesthesia: General | Site: Arm Upper | Laterality: Right | Wound class: Clean

## 2012-05-11 MED ORDER — FENTANYL CITRATE 0.05 MG/ML IJ SOLN
INTRAMUSCULAR | Status: DC | PRN
  Administered 2012-05-11: 25 ug via INTRAVENOUS
  Administered 2012-05-11: 50 ug via INTRAVENOUS
  Administered 2012-05-11: 25 ug via INTRAVENOUS
  Administered 2012-05-11: 50 ug via INTRAVENOUS

## 2012-05-11 MED ORDER — SODIUM CHLORIDE 0.9 % IV SOLN
INTRAVENOUS | Status: DC | PRN
  Administered 2012-05-11: 08:00:00 via INTRAVENOUS

## 2012-05-11 MED ORDER — SODIUM CHLORIDE 0.9 % IR SOLN
Status: DC | PRN
  Administered 2012-05-11: 09:00:00

## 2012-05-11 MED ORDER — ARTIFICIAL TEARS OP OINT
TOPICAL_OINTMENT | OPHTHALMIC | Status: DC | PRN
  Administered 2012-05-11: 1 via OPHTHALMIC

## 2012-05-11 MED ORDER — OXYCODONE HCL 5 MG PO TABS: 5.0000 mg | ORAL_TABLET | Freq: Four times a day (QID) | ORAL | Status: DC | PRN

## 2012-05-11 MED ORDER — HYDROMORPHONE HCL PF 1 MG/ML IJ SOLN
INTRAMUSCULAR | Status: AC
  Filled 2012-05-11: qty 1

## 2012-05-11 MED ORDER — LIDOCAINE HCL (CARDIAC) 20 MG/ML IV SOLN
INTRAVENOUS | Status: DC | PRN
  Administered 2012-05-11: 30 mg via INTRAVENOUS

## 2012-05-11 MED ORDER — ONDANSETRON HCL 4 MG/2ML IJ SOLN
INTRAMUSCULAR | Status: DC | PRN
  Administered 2012-05-11: 4 mg via INTRAVENOUS

## 2012-05-11 MED ORDER — LIDOCAINE-EPINEPHRINE (PF) 1 %-1:200000 IJ SOLN
INTRAMUSCULAR | Status: AC
  Filled 2012-05-11: qty 10

## 2012-05-11 MED ORDER — THROMBIN 20000 UNITS EX SOLR
OROMUCOSAL | Status: DC | PRN
  Administered 2012-05-11: 10:00:00 via TOPICAL

## 2012-05-11 MED ORDER — MUPIROCIN 2 % EX OINT
TOPICAL_OINTMENT | CUTANEOUS | Status: AC
  Administered 2012-05-11: 1 via NASAL
  Filled 2012-05-11: qty 22

## 2012-05-11 MED ORDER — PROPOFOL 10 MG/ML IV BOLUS
INTRAVENOUS | Status: DC | PRN
  Administered 2012-05-11: 125 mg via INTRAVENOUS

## 2012-05-11 MED ORDER — THROMBIN 20000 UNITS EX SOLR
CUTANEOUS | Status: AC
  Filled 2012-05-11: qty 20000

## 2012-05-11 MED ORDER — SODIUM CHLORIDE 0.9 % IV SOLN: INTRAVENOUS | Status: DC

## 2012-05-11 MED ORDER — OXYCODONE HCL 5 MG/5ML PO SOLN: 5.0000 mg | Freq: Once | ORAL | Status: DC | PRN

## 2012-05-11 MED ORDER — MEPERIDINE HCL 25 MG/ML IJ SOLN: 6.2500 mg | INTRAMUSCULAR | Status: DC | PRN

## 2012-05-11 MED ORDER — HYDROMORPHONE HCL PF 1 MG/ML IJ SOLN
0.2500 mg | INTRAMUSCULAR | Status: DC | PRN
  Administered 2012-05-11 (×2): 0.5 mg via INTRAVENOUS

## 2012-05-11 MED ORDER — HEPARIN SODIUM (PORCINE) 1000 UNIT/ML IJ SOLN: INTRAMUSCULAR | Status: DC | PRN

## 2012-05-11 MED ORDER — MUPIROCIN 2 % EX OINT
TOPICAL_OINTMENT | Freq: Two times a day (BID) | CUTANEOUS | Status: DC
  Filled 2012-05-11: qty 22

## 2012-05-11 MED ORDER — OXYCODONE HCL 5 MG PO TABS: 5.0000 mg | ORAL_TABLET | Freq: Once | ORAL | Status: DC | PRN

## 2012-05-11 MED ORDER — EPHEDRINE SULFATE 50 MG/ML IJ SOLN
INTRAMUSCULAR | Status: DC | PRN
  Administered 2012-05-11 (×2): 5 mg via INTRAVENOUS

## 2012-05-11 MED ORDER — ONDANSETRON HCL 4 MG/2ML IJ SOLN: 4.0000 mg | Freq: Once | INTRAMUSCULAR | Status: DC | PRN

## 2012-05-11 MED ORDER — HEPARIN SODIUM (PORCINE) 1000 UNIT/ML IJ SOLN
INTRAMUSCULAR | Status: DC | PRN
  Administered 2012-05-11: 5 mL via INTRAVENOUS

## 2012-05-11 MED ORDER — 0.9 % SODIUM CHLORIDE (POUR BTL) OPTIME
TOPICAL | Status: DC | PRN
  Administered 2012-05-11: 1000 mL

## 2012-05-11 MED ORDER — LIDOCAINE HCL (PF) 1 % IJ SOLN
INTRAMUSCULAR | Status: DC | PRN
  Administered 2012-05-11: .01 mg

## 2012-05-11 SURGICAL SUPPLY — 44 items
ADH SKN CLS APL DERMABOND .7 (GAUZE/BANDAGES/DRESSINGS) ×1
CANISTER SUCTION 2500CC (MISCELLANEOUS) ×2 IMPLANT
CLIP TI MEDIUM 24 (CLIP) ×1 IMPLANT
CLIP TI MEDIUM 6 (CLIP) ×1 IMPLANT
CLIP TI WIDE RED SMALL 24 (CLIP) ×1 IMPLANT
CLIP TI WIDE RED SMALL 6 (CLIP) IMPLANT
CLOTH BEACON ORANGE TIMEOUT ST (SAFETY) ×2 IMPLANT
COVER PROBE W GEL 5X96 (DRAPES) ×1 IMPLANT
COVER SURGICAL LIGHT HANDLE (MISCELLANEOUS) ×2 IMPLANT
DECANTER SPIKE VIAL GLASS SM (MISCELLANEOUS) ×1 IMPLANT
DERMABOND ADVANCED (GAUZE/BANDAGES/DRESSINGS) ×1
DERMABOND ADVANCED .7 DNX12 (GAUZE/BANDAGES/DRESSINGS) ×1 IMPLANT
DRAIN PENROSE 1/4X12 LTX STRL (WOUND CARE) ×2 IMPLANT
ELECT REM PT RETURN 9FT ADLT (ELECTROSURGICAL)
ELECTRODE REM PT RTRN 9FT ADLT (ELECTROSURGICAL) IMPLANT
GAUZE SPONGE 2X2 8PLY STRL LF (GAUZE/BANDAGES/DRESSINGS) ×1 IMPLANT
GEL ULTRASOUND 20GR AQUASONIC (MISCELLANEOUS) IMPLANT
GLOVE BIO SURGEON STRL SZ 6.5 (GLOVE) ×2 IMPLANT
GLOVE BIO SURGEON STRL SZ7.5 (GLOVE) ×2 IMPLANT
GLOVE BIOGEL PI IND STRL 6.5 (GLOVE) IMPLANT
GLOVE BIOGEL PI IND STRL 7.0 (GLOVE) IMPLANT
GLOVE BIOGEL PI INDICATOR 6.5 (GLOVE) ×2
GLOVE BIOGEL PI INDICATOR 7.0 (GLOVE) ×1
GOWN PREVENTION PLUS XLARGE (GOWN DISPOSABLE) ×4 IMPLANT
GOWN STRL NON-REIN LRG LVL3 (GOWN DISPOSABLE) ×3 IMPLANT
GRAFT GORETEX 6X10 (Vascular Products) ×1 IMPLANT
KIT BASIN OR (CUSTOM PROCEDURE TRAY) ×2 IMPLANT
KIT ROOM TURNOVER OR (KITS) ×2 IMPLANT
LOOP VESSEL MINI RED (MISCELLANEOUS) IMPLANT
NS IRRIG 1000ML POUR BTL (IV SOLUTION) ×2 IMPLANT
PACK CV ACCESS (CUSTOM PROCEDURE TRAY) ×2 IMPLANT
PAD ARMBOARD 7.5X6 YLW CONV (MISCELLANEOUS) ×4 IMPLANT
SLEEVE SURGEON STRL (DRAPES) ×1 IMPLANT
SPONGE GAUZE 2X2 STER 10/PKG (GAUZE/BANDAGES/DRESSINGS)
SPONGE SURGIFOAM ABS GEL 100 (HEMOSTASIS) ×2 IMPLANT
SUT PROLENE 6 0 CC (SUTURE) ×3 IMPLANT
SUT PROLENE 7 0 BV 1 (SUTURE) ×1 IMPLANT
SUT VIC AB 3-0 SH 27 (SUTURE) ×4
SUT VIC AB 3-0 SH 27X BRD (SUTURE) ×1 IMPLANT
SUT VICRYL 4-0 PS2 18IN ABS (SUTURE) ×3 IMPLANT
TOWEL OR 17X24 6PK STRL BLUE (TOWEL DISPOSABLE) ×2 IMPLANT
TOWEL OR 17X26 10 PK STRL BLUE (TOWEL DISPOSABLE) ×2 IMPLANT
UNDERPAD 30X30 INCONTINENT (UNDERPADS AND DIAPERS) ×2 IMPLANT
WATER STERILE IRR 1000ML POUR (IV SOLUTION) ×2 IMPLANT

## 2012-05-11 NOTE — Progress Notes (Signed)
Report given to jamie rn as caregiver 

## 2012-05-11 NOTE — Addendum Note (Signed)
Addendum  created 05/11/12 1228 by Luster Landsberg, CRNA   Modules edited:Anesthesia Medication Administration

## 2012-05-11 NOTE — Anesthesia Preprocedure Evaluation (Addendum)
Anesthesia Evaluation  Patient identified by MRN, date of birth, ID band Patient awake    Reviewed: Allergy & Precautions, H&P , NPO status , Patient's Chart, lab work & pertinent test results, reviewed documented beta blocker date and time   Airway Mallampati: II TM Distance: >3 FB Neck ROM: Full    Dental  (+) Poor Dentition, Missing and Dental Advisory Given   Pulmonary          Cardiovascular hypertension, Pt. on medications and Pt. on home beta blockers     Neuro/Psych PSYCHIATRIC DISORDERS Anxiety Depression  Neuromuscular disease    GI/Hepatic hiatal hernia, GERD-  Medicated and Controlled,  Endo/Other    Renal/GU ESRF and DialysisRenal disease     Musculoskeletal   Abdominal   Peds  Hematology   Anesthesia Other Findings   Reproductive/Obstetrics                           Anesthesia Physical Anesthesia Plan  ASA: III  Anesthesia Plan: General   Post-op Pain Management:    Induction:   Airway Management Planned: LMA  Additional Equipment:   Intra-op Plan:   Post-operative Plan: Extubation in OR  Informed Consent: I have reviewed the patients History and Physical, chart, labs and discussed the procedure including the risks, benefits and alternatives for the proposed anesthesia with the patient or authorized representative who has indicated his/her understanding and acceptance.   Dental advisory given  Plan Discussed with: CRNA and Surgeon  Anesthesia Plan Comments:       Anesthesia Quick Evaluation

## 2012-05-11 NOTE — Anesthesia Postprocedure Evaluation (Signed)
Anesthesia Post Note  Patient: [name]  Procedure(s) Performed: Procedure(s) (LRB): REVISON OF ARTERIOVENOUS FISTULA (Right)  Anesthesia type: general  Patient location: PACU  Post pain: Pain level controlled  Post assessment: Patient's Cardiovascular Status Stable  Last Vitals:  Filed Vitals:   05/11/12 1145  BP: 156/60  Pulse: 65  Temp: 36.7 C  Resp: 16    Post vital signs: Reviewed and stable  Level of consciousness: sedated  Complications: No apparent anesthesia complications

## 2012-05-11 NOTE — Preoperative (Signed)
Beta Blockers   Reason not to administer Beta Blockers:Not Applicable 

## 2012-05-11 NOTE — Progress Notes (Signed)
Dialysis access record/graft faxed to [facility] talked to [facility] informed of pts surgery location and K=3.2 will instruct pt to return to dialysis tomorrow at regular time pt told and understands

## 2012-05-11 NOTE — Op Note (Signed)
Procedure: Revision Right Upper Arm AV Fistula  Preop: Occlusion right arm AV fistula Postop: same  Anesthesia: General  Findings: 6 mm PTFE interposition graft  Operative details: After obtaining informed consent, the patient was taken to the operating room. The patient was placed in supine position on the operating room table. After induction of general anesthesia the patient's entire right upper extremity was prepped and draped in the usual sterile fashion. Next a longitudinal incision was made in the right upper arm over the area of the deltoid muscle. This was over a pre-existing right brachiocephalic AV fistula. Preoperative exam and shuntogram showed occlusion of the fistula at this level. The incision was carried into the subcutaneous tissues down to the level of the fistula. The vein was of good quality approximate 6 mm in diameter at this level. The fistula was patent at this level. Several small side branches were ligated and divided between silk ties. A vessel loop was placed around the fistula. Next attention was turned to the patient's right axilla. A longitudinal incision was made in the axilla and carried into the subcutaneous tissues down to level the axillary vein. This was dissected free circumferentially. Several small side branches were ligated and divided between silk ties. The vein was of good quality approximate 6 mm in diameter. A vessel loop was placed around this. The patient was given 5000 units of intravenous heparin. After 2 minutes of circulation time, the fistula was clamped proximally in the lower incision. The distal portion of the fistula was then ligated with a 2-0 silk tie. The vein was divided and spatulated. A 6 mm PTFE graft was brought on to the operative field and beveled. This was then sewn end-to-end to the vein using a running 6-0 Prolene suture. At completion of the anastomosis,  the clamp was released and the anastomosis was tested. One repair stitch was placed  posteriorly. The graft was then clamped distal to the anastomosis. The distal axillary vein was ligated with a 2-0 silk tie. The vein was controlled proximally with a fine bulldog clamp. The vein was gently distended with heparinized saline slightly spatulated. The new graft was then tunneled subcutaneously down to the level of the axillary vein. This was cut to length and beveled. This was then sewn end-to-end to the axillary vein using a running 6-0 Prolene suture. Just prior to completion of the anastomosis this was forebled backbled and thoroughly flushed. The anastomosis was secured. Clamps were released. There was a palpable thrill in the graft immediately. Hemostasis was obtained with thrombin and Gelfoam. The subcutaneous tissues were reapproximated using running 3-0 Vicryl suture. The skin was closed with a 4 0 Vicryl subcuticular stitch. The patient tolerated the procedure well and there were no complications. Instrument sponge and needle counts were correct at the end of the case. Dermabond was applied to the incision. The patient had a palpable radial pulse at the end of the case. Patient was taken to the recovery room in stable condition.  Fabienne Bruns, MD [facility] Office: [phone] Pager: [phone]

## 2012-05-11 NOTE — Interval H&P Note (Signed)
History and Physical Interval Note:  05/11/2012 7:51 AM  [name]  has presented today for surgery, with the diagnosis of End Stage Renal Disease  The various methods of treatment have been discussed with the patient and family. After consideration of risks, benefits and other options for treatment, the patient has consented to  Procedure(s) (LRB) with comments: REVISON OF ARTERIOVENOUS FISTULA (Right) as a surgical intervention .  The patient's history has been reviewed, patient examined, no change in status, stable for surgery.  I have reviewed the patient's chart and labs.  Questions were answered to the patient's satisfaction.     Aaro Meyers E

## 2012-05-11 NOTE — Transfer of Care (Signed)
Immediate Anesthesia Transfer of Care Note  Patient: [name]  Procedure(s) Performed: Procedure(s) (LRB) with comments: REVISON OF ARTERIOVENOUS FISTULA (Right)  Patient Location: PACU  Anesthesia Type:General  Level of Consciousness: awake, alert  and oriented  Airway & Oxygen Therapy: Patient Spontanous Breathing and Patient connected to nasal cannula oxygen  Post-op Assessment: Report given to PACU RN, Post -op Vital signs reviewed and stable and Patient moving all extremities  Post vital signs: Reviewed and stable  Complications: No apparent anesthesia complications

## 2012-05-11 NOTE — H&P (View-Only) (Signed)
71 year old female referred for evaluation of a right arm AV fistula. The fistula occluded recently. She is currently dialyzing via a left-sided catheter. Her current dialysis days Monday Wednesday and Friday. The fistula was placed in 2012 by Dr. Hart Rochester. She recently had a fistulogram.  Review of systems: She denies shortness of breath. She denies chest pain. She states that her catheter is working well.  Physical exam: Filed Vitals:   05/06/12 1607  BP: 175/66  Pulse: 88  Height: 5\' 2"  (1.575 m)  Weight: 136 lb 11.2 oz (62.007 kg)  SpO2: 100%   Right upper extremity: Pulsatile fistula right upper arm the pulse transmits all the way to the distal two thirds of the upper arm, no audible bruit  Neck: Left side dialysis catheter no evidence of infection  Data: Patient had a duplex ultrasound today which shows that her right basilic vein is 4-6 mm in diameter. The left basilic vein is short.  Images from the patient's recent fistulogram are reviewed which shows a fistula includes at the level that the pulses palpable with patent central veins  Assessment: Occluded right upper arm fistula. Options include placement of a new fistula which would have her catheter in place for several months. The other possible option would be to replace the distal portion of her right upper arm fistula with a piece of PTFE. This would allow them to start cannulating her fistula again immediately. The downside would be that this would probably have similar patency to a graft but certainly get her catheter out early. She has opted for revision of the right arm fistula.  Plan: See above. Her operation is scheduled for Tuesday, December 10. She would prefer general anesthesia due to anxiety issues.  Fabienne Bruns, MD [facility] Office: [phone] Pager: [phone]

## 2012-05-11 NOTE — Anesthesia Procedure Notes (Signed)
Procedure Name: LMA Insertion Date/Time: 05/11/2012 8:10 AM Performed by: Elizbeth Squires R Pre-anesthesia Checklist: Patient identified, Emergency Drugs available, Suction available and Patient being monitored Patient Re-evaluated:Patient Re-evaluated prior to inductionOxygen Delivery Method: Circle system utilized Preoxygenation: Pre-oxygenation with 100% oxygen Intubation Type: IV induction LMA: LMA with gastric port inserted LMA Size: 4.0 Tube type: Oral Number of attempts: 1 Placement Confirmation: positive ETCO2 and breath sounds checked- equal and bilateral Tube secured with: Tape Dental Injury: Teeth and Oropharynx as per pre-operative assessment

## 2012-05-12 ENCOUNTER — Encounter (HOSPITAL_COMMUNITY): Payer: Self-pay | Admitting: Vascular Surgery

## 2012-06-01 DIAGNOSIS — N186 End stage renal disease: Secondary | ICD-10-CM | POA: Diagnosis not present

## 2012-06-04 DIAGNOSIS — D631 Anemia in chronic kidney disease: Secondary | ICD-10-CM | POA: Diagnosis not present

## 2012-06-04 DIAGNOSIS — D509 Iron deficiency anemia, unspecified: Secondary | ICD-10-CM | POA: Diagnosis not present

## 2012-06-04 DIAGNOSIS — N186 End stage renal disease: Secondary | ICD-10-CM | POA: Diagnosis not present

## 2012-06-15 ENCOUNTER — Ambulatory Visit (INDEPENDENT_AMBULATORY_CARE_PROVIDER_SITE_OTHER): Payer: Medicaid Other | Admitting: Family Medicine

## 2012-06-15 ENCOUNTER — Other Ambulatory Visit: Payer: Self-pay | Admitting: Nephrology

## 2012-06-15 ENCOUNTER — Ambulatory Visit
Admission: RE | Admit: 2012-06-15 | Discharge: 2012-06-15 | Disposition: A | Discharge status: Home / Self Care | Payer: Medicare Other | Source: Ambulatory Visit | Attending: Nephrology | Admitting: Nephrology

## 2012-06-15 ENCOUNTER — Encounter: Payer: Self-pay | Admitting: Family Medicine

## 2012-06-15 VITALS — BP 180/80 | HR 73 | Temp 98.6°F | Ht 62.0 in | Wt 142.0 lb

## 2012-06-15 DIAGNOSIS — R7611 Nonspecific reaction to tuberculin skin test without active tuberculosis: Secondary | ICD-10-CM

## 2012-06-15 DIAGNOSIS — D239 Other benign neoplasm of skin, unspecified: Secondary | ICD-10-CM | POA: Diagnosis not present

## 2012-06-15 DIAGNOSIS — D229 Melanocytic nevi, unspecified: Secondary | ICD-10-CM | POA: Insufficient documentation

## 2012-06-15 NOTE — Assessment & Plan Note (Signed)
Clinically, nevus looks benign.  Given patient reports of changes and discomfort as well as the location will refer to dermatology for assessment and potential biopsy/removal.  I did not want to remove this as it is located on the face.

## 2012-06-15 NOTE — Progress Notes (Signed)
Subjective:     Patient ID: [name], female   DOB: 1941-05-17, 72 y.o.   MRN: [id number]  HPI 72 year old elderly lady with a complex and extensive PMH presents today for evaluation of a mole.  Patient is currently on Hemodialysis and has had a recent positive PPD.   1) Mole - Patient reports that she has a bothersome mole on her forehead.  - She states that is has been present for 4-5 years but has been worsening recently.  She states that it is now raised and is occasionally painful (described as a throbbing) and itchy - Also reports that the center of the mole is starting to lighten in color.  No bleeding noted.  2) + PPD - Recently obtained by Dialysis center and has positive result - Follow up chest xray revealed no acute cardiopulmonary findings  3) Depression - Patient reports that her depression is markedly improved. - She has follow up with Mental health provider soon  Review of Systems Denies chest pain, SOB.     Objective:   Physical Exam General: well appearing elderly lady, NAD Heart: RRR. No murmurs, rubs, or gallops. Lungs: CTAB. No rales, rhonchi, or wheeze. Skin: dark, circular nevus noted on the forehead (left of midline). Approximately 0.5 cm in size.  Symmetric with well defined border. No appreciable color variegation     Assessment:        Plan:

## 2012-06-15 NOTE — Patient Instructions (Addendum)
It was nice seeing you today.  Please be sure to attend your dermatology appointment.  I am contacting the [place] [facility] regarding your positive PPD.  They will be in touch with you.  Follow up with me in 3-6 months or earlier as needed.

## 2012-06-15 NOTE — Assessment & Plan Note (Signed)
Patient has had recent PPD which was positive.  Follow up chest xray was negative.  Contacted [employer] [facility] to notify them.  They will be in contact with patient for possible treatment.

## 2012-06-22 DIAGNOSIS — L680 Hirsutism: Secondary | ICD-10-CM | POA: Diagnosis not present

## 2012-06-22 DIAGNOSIS — D485 Neoplasm of uncertain behavior of skin: Secondary | ICD-10-CM | POA: Diagnosis not present

## 2012-06-22 DIAGNOSIS — L723 Sebaceous cyst: Secondary | ICD-10-CM | POA: Diagnosis not present

## 2012-06-22 DIAGNOSIS — L280 Lichen simplex chronicus: Secondary | ICD-10-CM | POA: Diagnosis not present

## 2012-06-22 DIAGNOSIS — L738 Other specified follicular disorders: Secondary | ICD-10-CM | POA: Diagnosis not present

## 2012-06-23 ENCOUNTER — Other Ambulatory Visit (HOSPITAL_COMMUNITY): Payer: Self-pay | Admitting: Nephrology

## 2012-06-23 DIAGNOSIS — N186 End stage renal disease: Secondary | ICD-10-CM

## 2012-06-28 ENCOUNTER — Other Ambulatory Visit: Payer: Self-pay | Admitting: *Deleted

## 2012-06-28 ENCOUNTER — Encounter (HOSPITAL_COMMUNITY): Payer: Self-pay | Admitting: Pharmacy Technician

## 2012-06-28 MED ORDER — ALLOPURINOL 100 MG PO TABS: ORAL_TABLET | ORAL | Status: DC

## 2012-06-29 ENCOUNTER — Ambulatory Visit (HOSPITAL_COMMUNITY)
Admission: RE | Admit: 2012-06-29 | Discharge: 2012-06-29 | Disposition: A | Discharge status: Home / Self Care | Payer: Medicare Other | Source: Ambulatory Visit | Attending: Nephrology | Admitting: Nephrology

## 2012-06-29 DIAGNOSIS — N186 End stage renal disease: Secondary | ICD-10-CM | POA: Insufficient documentation

## 2012-06-29 DIAGNOSIS — Z4901 Encounter for fitting and adjustment of extracorporeal dialysis catheter: Secondary | ICD-10-CM | POA: Diagnosis not present

## 2012-06-29 DIAGNOSIS — Z452 Encounter for adjustment and management of vascular access device: Secondary | ICD-10-CM | POA: Diagnosis not present

## 2012-06-29 NOTE — Procedures (Signed)
Successful removal of (L)IJ tunneled HD catheter. No complications.  [name] [facility] 06/29/2012 11:54 AM

## 2012-06-30 ENCOUNTER — Telehealth (HOSPITAL_COMMUNITY): Payer: Self-pay | Admitting: *Deleted

## 2012-07-02 ENCOUNTER — Other Ambulatory Visit: Payer: Self-pay | Admitting: *Deleted

## 2012-07-02 DIAGNOSIS — N186 End stage renal disease: Secondary | ICD-10-CM | POA: Diagnosis not present

## 2012-07-02 MED ORDER — ATORVASTATIN CALCIUM 40 MG PO TABS: 40.0000 mg | ORAL_TABLET | Freq: Every morning | ORAL | Status: DC

## 2012-07-05 DIAGNOSIS — D509 Iron deficiency anemia, unspecified: Secondary | ICD-10-CM | POA: Diagnosis not present

## 2012-07-05 DIAGNOSIS — N186 End stage renal disease: Secondary | ICD-10-CM | POA: Diagnosis not present

## 2012-07-23 ENCOUNTER — Telehealth: Payer: Self-pay | Admitting: Family Medicine

## 2012-07-23 NOTE — Telephone Encounter (Signed)
4:40 pm---Have not received call back from [facility].  I called [facility]/[address] and they will give patient a 3-day supply of Abilify until Monday.  Spoke with patient's sister and informed she can pick up med from pharmacy.  Sister will contact [facility] for work-in appt on Monday to see MD for med refill.  Gaylene Brooks, RN

## 2012-07-23 NOTE — Telephone Encounter (Signed)
Patient was given Abilify Rx from [facility] [place] in October or November for "agitation and hearing voices."  Med has been helping with symptoms until she ran out of med recently on Monday (07/19/12).  Patient has been "pacing the floor" and symptoms worse at night.  Patient's appt was canceled & rescheduled from 06/30/12 due to the snow.  [facility] unable to refill.  Patient has an appt next Thursday (07/29/12) with the therapist instead of the doctor and he is unable to prescribe meds.    I called [facility] at [phone] and left message on voicemail for Tally Joe, RN to call our office back.  Gaylene Brooks, RN

## 2012-07-23 NOTE — Telephone Encounter (Signed)
Patient's sister calls with a question about meds that [name] is currently taking. Some symptoms that the meds were taking care of are starting to come back and she needs to know whether she needs to be seen.

## 2012-07-26 DIAGNOSIS — F4310 Post-traumatic stress disorder, unspecified: Secondary | ICD-10-CM | POA: Diagnosis not present

## 2012-07-26 DIAGNOSIS — F333 Major depressive disorder, recurrent, severe with psychotic symptoms: Secondary | ICD-10-CM | POA: Diagnosis not present

## 2012-07-29 DIAGNOSIS — F333 Major depressive disorder, recurrent, severe with psychotic symptoms: Secondary | ICD-10-CM | POA: Diagnosis not present

## 2012-07-29 DIAGNOSIS — F4310 Post-traumatic stress disorder, unspecified: Secondary | ICD-10-CM | POA: Diagnosis not present

## 2012-07-30 ENCOUNTER — Telehealth: Payer: Self-pay | Admitting: Family Medicine

## 2012-07-30 ENCOUNTER — Ambulatory Visit: Payer: Medicare Other | Admitting: Family Medicine

## 2012-07-30 DIAGNOSIS — N186 End stage renal disease: Secondary | ICD-10-CM | POA: Diagnosis not present

## 2012-07-30 NOTE — Telephone Encounter (Signed)
Pt has cancelled her appt for today - she states it was just to clarify a medication - she says that the Allopurinol is written wrong and needs to say take every day, not just after dialysis, which doesn't affect it.  Needs it to be changed.

## 2012-08-02 DIAGNOSIS — D631 Anemia in chronic kidney disease: Secondary | ICD-10-CM | POA: Diagnosis not present

## 2012-08-02 DIAGNOSIS — N186 End stage renal disease: Secondary | ICD-10-CM | POA: Diagnosis not present

## 2012-08-02 DIAGNOSIS — N2581 Secondary hyperparathyroidism of renal origin: Secondary | ICD-10-CM | POA: Diagnosis not present

## 2012-08-02 NOTE — Telephone Encounter (Signed)
Cannot change Rx as this is the correct dosing in the setting of renal failure & hemodialysis (I spoke with pharmacy about this a while back).

## 2012-08-05 MED ORDER — ALLOPURINOL 100 MG PO TABS: ORAL_TABLET | ORAL | Status: DC

## 2012-08-05 NOTE — Telephone Encounter (Signed)
Informed patient of Dr. Patsey Berthold message and patient verbalized understanding.  Patient has dialysis on M, W, F.    Wants to know if Dr. Adriana Simas can Rx enough med to last until she is due for her next refill in 2 weeks.   Patient states she will start taking med as rx'd by Dr. Adriana Simas on days after dialysis---T, Th, Sat.  Also wants to know if she should take med on Sundays.  Will route note to Dr. Adriana Simas and call patient back tomorrow.  Gaylene Brooks, RN

## 2012-08-05 NOTE — Telephone Encounter (Signed)
Rx for Allopurinol sent in with refills.

## 2012-08-05 NOTE — Telephone Encounter (Addendum)
Pt has been taking this med every day for the last 5-6 years and is now out - pharmacy states that it is too early to refill according to the directions - not sure what to do at this point  Pt will be on dialysis tomorrow from 10:30 - 4:30

## 2012-08-05 NOTE — Addendum Note (Signed)
Addended by: Tommie Sams on: 08/05/2012 05:59 PM   Modules accepted: Orders

## 2012-08-19 ENCOUNTER — Encounter: Payer: Self-pay | Admitting: Family Medicine

## 2012-08-19 ENCOUNTER — Ambulatory Visit (INDEPENDENT_AMBULATORY_CARE_PROVIDER_SITE_OTHER): Payer: Medicare Other | Admitting: Family Medicine

## 2012-08-19 VITALS — BP 169/75 | HR 73 | Ht 62.0 in | Wt 152.2 lb

## 2012-08-19 DIAGNOSIS — B373 Candidiasis of vulva and vagina: Secondary | ICD-10-CM | POA: Diagnosis not present

## 2012-08-19 DIAGNOSIS — R1310 Dysphagia, unspecified: Secondary | ICD-10-CM

## 2012-08-19 DIAGNOSIS — L259 Unspecified contact dermatitis, unspecified cause: Secondary | ICD-10-CM | POA: Diagnosis not present

## 2012-08-19 DIAGNOSIS — IMO0001 Reserved for inherently not codable concepts without codable children: Secondary | ICD-10-CM

## 2012-08-19 DIAGNOSIS — M7918 Myalgia, other site: Secondary | ICD-10-CM | POA: Insufficient documentation

## 2012-08-19 DIAGNOSIS — B3731 Acute candidiasis of vulva and vagina: Secondary | ICD-10-CM | POA: Insufficient documentation

## 2012-08-19 DIAGNOSIS — L309 Dermatitis, unspecified: Secondary | ICD-10-CM | POA: Insufficient documentation

## 2012-08-19 DIAGNOSIS — N898 Other specified noninflammatory disorders of vagina: Secondary | ICD-10-CM | POA: Diagnosis not present

## 2012-08-19 LAB — POCT WET PREP (WET MOUNT)

## 2012-08-19 MED ORDER — FLUTICASONE PROPIONATE 50 MCG/ACT NA SUSP: 2.0000 | Freq: Every day | NASAL | Status: DC

## 2012-08-19 MED ORDER — LORATADINE 10 MG PO TABS: 10.0000 mg | ORAL_TABLET | Freq: Every day | ORAL | Status: DC

## 2012-08-19 MED ORDER — FLUCONAZOLE 150 MG PO TABS: 150.0000 mg | ORAL_TABLET | ORAL | Status: DC

## 2012-08-19 MED ORDER — HYDROCORTISONE 1 % EX CREA: TOPICAL_CREAM | Freq: Every day | CUTANEOUS | Status: DC | PRN

## 2012-08-19 NOTE — Assessment & Plan Note (Signed)
Rx sent in for hydrocortisone.  Patient instructed to use PRN.

## 2012-08-19 NOTE — Assessment & Plan Note (Signed)
Patient's pain appears MSK in origin.  Encouraged PRN Tylenol, maintaining good posture and heating pad.

## 2012-08-19 NOTE — Progress Notes (Signed)
Subjective:     Patient ID: [name], female   DOB: 04/02/1941, 72 y.o.   MRN: [id number]  HPI 72 year old female with ESRD, HTN, HLD, GERD, Anxiety/Depression who presents for follow up.  1) Dysphagia/GERD - This has been a problem for the patient in the past and has been attributed to gastroparesis - Recently this has been worsening. - Patient reports difficulty swallowing own secretions.  No difficulty with solid foods or liquids. - Patient also reports heartburn and regular cough despite Omeprazole 40 mg - Nephrologist requesting GI referral  ROS: No pain with swallowing.  Rare vomiting.   2) Vaginal odor - Reports vaginal odor.  No discharge. - Odor remains despite vagisil, frequent bathing ROS: Denies sexual activity  3) Skin itching, face - Has recently seen by Dermatology - Patient has hypertrichosis of the chin and gets itching frequently especially after removing hairs. - States that hydrocortisone has help her itching in the past.  4) Neck and shoulder pain - Reports difficulty maintaining posture at the end of the day. - States it is painful to keep upright and maintain posture.   - ROS: No reports of numbness/tingling.  Review of Systems See HPI    Objective:   Physical Exam  Filed Vitals:   08/19/12 1338  BP: 169/75  Pulse: 73   General: well appearing, NAD. Heart: RRR, no murmurs, rubs, or gallops. Lungs: CTAB. No rales, rhonchi, or wheeze. Abdomen: soft, nontender, nondistended. No organomegaly. Extremities: no cyanosis, clubbing, or edema. Skin: hyperpigmentation noted on chin.    Assessment:        Plan:

## 2012-08-19 NOTE — Patient Instructions (Addendum)
It was nice seeing you today.  I have sent in your prescriptions for Hydrocortisone, Claritin, and Flonase.  In regards, to your pain - please try to remain good posture.  You can use a heating pad and Tylenol as needed (do not exceed 4000 mg in 24 hours).  Our clinic will be in contact regarding your referral to GI.  I will call you with the results of your test.

## 2012-08-19 NOTE — Assessment & Plan Note (Signed)
Unclear etiology.  However given history of GERD and symptoms (despite 40 mg PPI) will arrange GI referral for further work up and potential manometry and/or EGD.

## 2012-08-19 NOTE — Assessment & Plan Note (Signed)
Wet prep revealed Moderate yeast. Rx sent in for Diflucan 2 doses (72 hours apart)

## 2012-08-25 ENCOUNTER — Encounter: Payer: Self-pay | Admitting: Gastroenterology

## 2012-08-26 ENCOUNTER — Telehealth: Payer: Self-pay | Admitting: Family Medicine

## 2012-08-26 NOTE — Telephone Encounter (Signed)
Patient came in a week ago and was being treated for a yeast infection, but a week later and it isn't any better and she has finished taking the medication.

## 2012-08-26 NOTE — Telephone Encounter (Signed)
Please advise. [name]  

## 2012-08-30 ENCOUNTER — Other Ambulatory Visit: Payer: Self-pay | Admitting: *Deleted

## 2012-08-30 DIAGNOSIS — N186 End stage renal disease: Secondary | ICD-10-CM | POA: Diagnosis not present

## 2012-08-30 MED ORDER — CLONIDINE HCL 0.1 MG PO TABS: 0.1000 mg | ORAL_TABLET | Freq: Three times a day (TID) | ORAL | Status: DC

## 2012-08-30 MED ORDER — FLUCONAZOLE 150 MG PO TABS: 150.0000 mg | ORAL_TABLET | ORAL | Status: DC

## 2012-08-30 NOTE — Telephone Encounter (Signed)
Additional Rx for fluconazole x 3 doses sent to pharmacy.   Please notify patient.   Thank you

## 2012-09-01 DIAGNOSIS — N186 End stage renal disease: Secondary | ICD-10-CM | POA: Diagnosis not present

## 2012-09-01 DIAGNOSIS — N2581 Secondary hyperparathyroidism of renal origin: Secondary | ICD-10-CM | POA: Diagnosis not present

## 2012-09-01 DIAGNOSIS — D509 Iron deficiency anemia, unspecified: Secondary | ICD-10-CM | POA: Diagnosis not present

## 2012-09-01 DIAGNOSIS — D631 Anemia in chronic kidney disease: Secondary | ICD-10-CM | POA: Diagnosis not present

## 2012-09-10 ENCOUNTER — Telehealth: Payer: Self-pay | Admitting: Family Medicine

## 2012-09-10 NOTE — Telephone Encounter (Signed)
Patient should schedule an appointment to be seen for further evaluation (given possible treatment failure).  Thank you

## 2012-09-10 NOTE — Telephone Encounter (Signed)
Spoke with patient's daughter and informed her of below 

## 2012-09-10 NOTE — Telephone Encounter (Signed)
Patient has had continued symptoms and would like to know what the next steps will be.

## 2012-09-14 ENCOUNTER — Ambulatory Visit (INDEPENDENT_AMBULATORY_CARE_PROVIDER_SITE_OTHER): Payer: Medicare Other | Admitting: Family Medicine

## 2012-09-14 VITALS — BP 161/62 | HR 70 | Temp 97.7°F | Wt 156.3 lb

## 2012-09-14 DIAGNOSIS — N949 Unspecified condition associated with female genital organs and menstrual cycle: Secondary | ICD-10-CM | POA: Diagnosis not present

## 2012-09-14 DIAGNOSIS — N898 Other specified noninflammatory disorders of vagina: Secondary | ICD-10-CM

## 2012-09-14 DIAGNOSIS — IMO0001 Reserved for inherently not codable concepts without codable children: Secondary | ICD-10-CM | POA: Diagnosis not present

## 2012-09-14 DIAGNOSIS — M7918 Myalgia, other site: Secondary | ICD-10-CM

## 2012-09-14 LAB — POCT WET PREP (WET MOUNT): Clue Cells Wet Prep Whiff POC: NEGATIVE

## 2012-09-14 MED ORDER — CYCLOBENZAPRINE HCL 5 MG PO TABS: 5.0000 mg | ORAL_TABLET | Freq: Three times a day (TID) | ORAL | Status: DC | PRN

## 2012-09-14 MED ORDER — METRONIDAZOLE 0.75 % VA GEL: 1.0000 | Freq: Two times a day (BID) | VAGINAL | Status: DC

## 2012-09-14 MED ORDER — AMLODIPINE BESYLATE 10 MG PO TABS: 10.0000 mg | ORAL_TABLET | Freq: Every day | ORAL | Status: DC

## 2012-09-14 NOTE — Progress Notes (Signed)
Subjective:     Patient ID: [name], female   DOB: 04-23-1941, 72 y.o.   MRN: [id number]  HPI [name] presents today for re-evaluation of vaginal odor.  Patient also would like to discuss neck pain.  1) Vaginal odor - Patient seen previously and diagnosed with Yeast infection.  She has now completed course of Diflucan x 2.  - She reports improvement in odor initially with treatment but has returned. - She reports embarrassing vaginal odor which she describes as musty. - No associated vaginal discharge. - She does report a burning sensation. No dysuria.  2) Neck pain - Patient reports daily, right sided neck pain  - Improves with Tylenol but continues to persist. - Patient reports fatigue and pain particularly at the end of the day. - No numbness/tingling.  Review of Systems See HPI.    Objective:   Physical Exam  Filed Vitals:   09/14/12 1114  BP: 161/62  Pulse: 70  Temp: 97.7 F (36.5 C)   General: well appearing, NAD. Neck/MSK: Pain upon palpation of the right paraspinal musculature of neck. Decrease ROM secondary to pain.  Tightness of Trapezius and paraspinal musculature appreciated.  Pelvic Exam:        External: normal female genitalia without lesions or masses        Vagina: normal without lesions or masses. Cystocele present.        Samples for Wet prep obtained.     Assessment:         Plan:

## 2012-09-14 NOTE — Patient Instructions (Addendum)
You likely have Bacterial Vaginosis -  an overgrowth of bacteria in your vagina. This is not a sexually transmitted infection.  Sexual partners do not need to be treated, however abstaining from sex or using condoms may prevent recurrence of the overgrowth. Some women have a recurrence of the overgrowth even when fully treated.  Call the office if your symptoms do not resolve.  Do not douche.  This is associated with decreased cure rates and more bacterial overgrowths. Take the full course of the antibiotic prescribed to you even if you begin to feel better.  In regards to your Neck pain, I have prescribed flexeril.  If your pain persists we can discuss other options including physical therapy.

## 2012-09-14 NOTE — Assessment & Plan Note (Signed)
Unclear etiology. Wet prep revealed few yeast and some cocci. Given odor and cocci present on wet prep, will treat with Metrogel x 5 days for questionable BV.

## 2012-09-14 NOTE — Assessment & Plan Note (Signed)
Pain is MSK in origin.  Rx given for Flexeril for spasm/tightness.  Will consider manipulation (OMT) or PT if continues to have pain.

## 2012-09-17 ENCOUNTER — Telehealth: Payer: Self-pay | Admitting: Gastroenterology

## 2012-09-17 NOTE — Telephone Encounter (Signed)
Rec'd from [facility] forward 28 pages to Dr.Stark 09/17/12

## 2012-09-21 ENCOUNTER — Ambulatory Visit: Payer: Self-pay | Admitting: Gastroenterology

## 2012-09-21 DIAGNOSIS — F333 Major depressive disorder, recurrent, severe with psychotic symptoms: Secondary | ICD-10-CM | POA: Diagnosis not present

## 2012-09-21 DIAGNOSIS — F4310 Post-traumatic stress disorder, unspecified: Secondary | ICD-10-CM | POA: Diagnosis not present

## 2012-09-23 ENCOUNTER — Ambulatory Visit (INDEPENDENT_AMBULATORY_CARE_PROVIDER_SITE_OTHER): Payer: Medicare Other | Admitting: Gastroenterology

## 2012-09-23 ENCOUNTER — Encounter: Payer: Self-pay | Admitting: Gastroenterology

## 2012-09-23 VITALS — BP 122/62 | HR 77 | Ht 61.0 in | Wt 159.0 lb

## 2012-09-23 DIAGNOSIS — R1310 Dysphagia, unspecified: Secondary | ICD-10-CM | POA: Diagnosis not present

## 2012-09-23 DIAGNOSIS — Z86010 Personal history of colonic polyps: Secondary | ICD-10-CM

## 2012-09-23 DIAGNOSIS — K219 Gastro-esophageal reflux disease without esophagitis: Secondary | ICD-10-CM

## 2012-09-23 DIAGNOSIS — M542 Cervicalgia: Secondary | ICD-10-CM | POA: Diagnosis not present

## 2012-09-23 DIAGNOSIS — K3184 Gastroparesis: Secondary | ICD-10-CM | POA: Diagnosis not present

## 2012-09-23 NOTE — Progress Notes (Signed)
History of Present Illness: This is a 72 year old female who relates left throat pain and difficulty swallowing solids and liquids. She localizes the symptoms to the left neck for the past 6 weeks. She also notes occasional coughing when swallowing foods and at other times. She is concerned her symptoms could be side effects from Abilify. She has a long history of GERD and has been evaluated by Dr. Carman Ching extensively over the past several years. She underwent upper endoscopy in 2007 and in 2009 showing a large hiatal hernia. She underwent colonoscopy in 2007 and in August 2009 showing diverticulosis and small adenomatous colon polyps. Random colonic biopsies for evaluation of diarrhea were negative. She complains of frequent belching and only good control of her reflux symptoms on daily omeprazole. Denies weight loss, abdominal pain, constipation, diarrhea, change in stool caliber, melena, hematochezia, nausea, vomiting, chest pain.  Allergies  Allergen Reactions  . Codeine Other (See Comments)    unknown  . Pregabalin Nausea And Vomiting    confusion  . Tuberculin Tests Hives    "blisters"  . Penicillins Rash    No problems breathing. Has tolerated omnicef in past without issue  . Sulfamethoxazole Rash   Outpatient Prescriptions Prior to Visit  Medication Sig Dispense Refill  . acetaminophen (TYLENOL) 500 MG tablet Take 500 mg by mouth every 6 (six) hours as needed. For pain      . allopurinol (ZYLOPRIM) 100 MG tablet Only take on days following dialysis (Tues, Thurs, Sat).  30 tablet  6  . amLODipine (NORVASC) 10 MG tablet Take 1 tablet (10 mg total) by mouth daily.  90 tablet  3  . ARIPiprazole (ABILIFY) 2 MG tablet Take 1 mg by mouth every morning.      Marland Kitchen aspirin EC 81 MG tablet Take 81 mg by mouth every morning.      Marland Kitchen atorvastatin (LIPITOR) 40 MG tablet Take 1 tablet (40 mg total) by mouth every morning.  30 tablet  6  . calcium acetate (PHOSLO) 667 MG capsule Take 667 mg by mouth  3 (three) times daily with meals.       . Calcium Carbonate Antacid (TUMS PO) Take 1 tablet by mouth daily as needed. For stomach upset      . cloNIDine (CATAPRES) 0.1 MG tablet Take 1 tablet (0.1 mg total) by mouth 3 (three) times daily.  90 tablet  3  . cyclobenzaprine (FLEXERIL) 5 MG tablet Take 1 tablet (5 mg total) by mouth 3 (three) times daily as needed for muscle spasms.  30 tablet  1  . fluconazole (DIFLUCAN) 150 MG tablet Take 1 tablet (150 mg total) by mouth every 3 (three) days.  3 tablet  0  . fluticasone (FLONASE) 50 MCG/ACT nasal spray Place 2 sprays into the nose daily.  16 g  6  . folic acid-vitamin b complex-vitamin c-selenium-zinc (DIALYVITE) 3 MG TABS Take 1 tablet by mouth daily.      . hydrocortisone cream 1 % Apply topically daily as needed.  120 g  0  . lidocaine-prilocaine (EMLA) cream Apply 1 application topically 3 (three) times a week. Use before dialysis on MWF      . loratadine (CLARITIN) 10 MG tablet Take 1 tablet (10 mg total) by mouth daily.  30 tablet  6  . metoprolol succinate (TOPROL-XL) 100 MG 24 hr tablet Take 100 mg by mouth every evening. Take with or immediately following a meal.      . metroNIDAZOLE (METROGEL) 0.75 % vaginal  gel Place 1 Applicatorful vaginally 2 (two) times daily. For 5 days.  70 g  0  . mirtazapine (REMERON) 7.5 MG tablet Take 7.5 mg by mouth at bedtime.       Marland Kitchen omeprazole (PRILOSEC) 20 MG capsule Take 40 mg by mouth daily.       . polyethylene glycol powder (GLYCOLAX/MIRALAX) powder Take 17 g by mouth daily as needed. For constipation      . senna (SENOKOT) 8.6 MG tablet Take 2 tablets by mouth daily as needed. For constipation       No facility-administered medications prior to visit.   Past Medical History  Diagnosis Date  . Hyperlipidemia   . Hypertension   . Normal cardiac stress test 12/24/2009    lexiscan, imaging normal  . 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 Diverticulitis   . IBS  (irritable bowel syndrome)   . Thyroid disease   . Depression   . Chronic kidney disease     Hemo MWF  . GERD (gastroesophageal reflux disease)   . H/O hiatal hernia   . Arthritis   . RLS (restless legs syndrome)   . Seizures   . Tubular adenoma of colon 01/2008   Past Surgical History  Procedure Laterality Date  . Cardiac catheterization  2003    normal  . Cholecystectomy      Open mid line incision  . Frontal craniotomy  2002    indication = sinusitis  . Appendectomy    . Tubal ligation    . Abdominal hysterectomy    . Insertion of dialysis catheter      Left  . Revison of arteriovenous fistula  05/11/2012    Procedure: REVISON OF ARTERIOVENOUS FISTULA;  Surgeon: Sherren Kerns, MD;  Location: [facility] OR;  Service: Vascular;  Laterality: Right;   History   Social History  . Marital Status: Legally Separated    Spouse Name: N/A    Number of Children: 3  . Years of Education: N/A   Social History Main Topics  . Smoking status: Current Every Day Smoker -- 0.50 packs/day for 60 years    Types: Cigarettes  . Smokeless tobacco: Never Used  . Alcohol Use: No  . Drug Use: No     Comment: No hx of IV drug use  . Sexually Active: No   Other Topics Concern  . None   Social History Narrative   Lives in [place] with Daughter, granddaughters (twins) and mentally challenged niece.   Retired Buyer, retail.         Family History  Problem Relation Age of Onset  . Thyroid cancer Mother   . Heart disease Father   . Hypertension Father   . Breast cancer Sister     Review of Systems: Pertinent positive and negative review of systems were noted in the above HPI section. All other review of systems were otherwise negative.  Is Physical Exam: General: Well developed , well nourished, no acute distress Head: Normocephalic and atraumatic Eyes:  sclerae anicteric, EOMI Ears: Normal auditory acuity Mouth: No deformity or lesions Neck: Supple, no masses or thyromegaly, left  neck tenderness without adenopathy or masses Lungs: Clear throughout to auscultation Heart: Regular rate and rhythm; no murmurs, rubs or bruits Abdomen: Soft, non tender and non distended. No masses, hepatosplenomegaly or hernias noted. Normal Bowel sounds Rectal: Deferred Musculoskeletal: Symmetrical with no gross deformities  Skin: No lesions on visible extremities  Pulses:  Normal pulses noted Extremities: No clubbing, cyanosis, edema or deformities noted Neurological: Alert oriented x 4, grossly nonfocal Cervical Nodes:  No significant cervical adenopathy Inguinal Nodes: No significant inguinal adenopathy Psychological:  Alert and cooperative. Normal mood and affect  Assessment and Recommendations:  1. Left neck pain, solid and liquid dysphagia associated with coughing/choking presumably oropharyngeal dysphasia. Rule out ENT disorders. Schedule a modified barium swallow study and barium esophagram to further evaluate. She may need further evaluation by ENT and/or neurology.  2. Chronic GERD with a large hiatal hernia. Standard antireflux measures and continue omeprazole 40 mg daily. Consider increasing omeprazole to 40 mg twice a day if her reflux symptoms are not adequately controlled.  3. Gastroparesis. Continue standard dietary measures. This problem likely exacerbates her chronic GERD.  4. Personal history of adenomatous colon polyps. A five-year surveillance colonoscopy will be due in August 2014.   5. Diverticulosis.  6. ESRD on hemodialysis.

## 2012-09-23 NOTE — Patient Instructions (Addendum)
You have been scheduled for a modified barium swallow on 09/30/12 at 1:00pm and barium swallow is at 1:30pm to follow. Please arrive 15 minutes prior to your test for registration. You will go to [facility] (1st Floor) for your appointment. Please refrain from eating or drinking anything 4 hours prior to your test. Should you need to cancel or reschedule your appointment, please contact [phone] [facility]) or [phone] [facility]). _____________________________________________________________________ A Modified Barium Swallow Study, or MBS, is a special x-ray that is taken to check swallowing skills. It is carried out by a Marine scientist and a Warehouse manager (SLP). During this test, yourmouth, throat, and esophagus, a muscular tube which connects your mouth to your stomach, is checked. The test will help you, your doctor, and the SLP plan what types of foods and liquids are easier for you to swallow. The SLP will also identify positions and ways to help you swallow more easily and safely. What will happen during an MBS? You will be taken to an x-ray room and seated comfortably. You will be asked to swallow small amounts of food and liquid mixed with barium. Barium is a liquid or paste that allows images of your mouth, throat and esophagus to be seen on x-ray. The x-ray captures moving images of the food you are swallowing as it travels from your mouth through your throat and into your esophagus. This test helps identify whether food or liquid is entering your lungs (aspiration). The test also shows which part of your mouth or throat lacks strength or coordination to move the food or liquid in the right direction. This test typically takes 30 minutes to 1 hour to complete. _______________________________________________________________________  Thank you for choosing me and [facility].  Venita Lick. Pleas Koch., MD., Clementeen Graham  cc: Terrial Rhodes, MD

## 2012-09-29 DIAGNOSIS — N186 End stage renal disease: Secondary | ICD-10-CM | POA: Diagnosis not present

## 2012-09-30 ENCOUNTER — Ambulatory Visit (HOSPITAL_COMMUNITY)
Admission: RE | Admit: 2012-09-30 | Discharge: 2012-09-30 | Disposition: A | Discharge status: Home / Self Care | Payer: Medicare Other | Source: Ambulatory Visit | Attending: Gastroenterology | Admitting: Gastroenterology

## 2012-09-30 ENCOUNTER — Other Ambulatory Visit: Payer: Self-pay

## 2012-09-30 ENCOUNTER — Other Ambulatory Visit: Payer: Self-pay | Admitting: Gastroenterology

## 2012-09-30 DIAGNOSIS — R1310 Dysphagia, unspecified: Secondary | ICD-10-CM | POA: Insufficient documentation

## 2012-09-30 DIAGNOSIS — K225 Diverticulum of esophagus, acquired: Secondary | ICD-10-CM | POA: Diagnosis not present

## 2012-09-30 NOTE — Procedures (Signed)
Objective Swallowing Evaluation: Modified Barium Swallowing Study  Patient Details  Name: [name] MRN: [id number] Date of Birth: [birth year]  Today's Date: 09/30/2012 Time: 1330-1330 SLP Time Calculation (min): 0 min  Past Medical History:  Past Medical History  Diagnosis Date  . Hyperlipidemia   . Hypertension   . Normal cardiac stress test 12/24/2009    lexiscan, imaging normal  . 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 Diverticulitis   . IBS (irritable bowel syndrome)   . Thyroid disease   . Depression   . Chronic kidney disease     Hemo MWF  . GERD (gastroesophageal reflux disease)   . H/O hiatal hernia   . Arthritis   . RLS (restless legs syndrome)   . Seizures   . Tubular adenoma of colon 01/2008   Past Surgical History:  Past Surgical History  Procedure Laterality Date  . Cardiac catheterization  2003    normal  . Cholecystectomy      Open mid line incision  . Frontal craniotomy  2002    indication = sinusitis  . Appendectomy    . Tubal ligation    . Abdominal hysterectomy    . Insertion of dialysis catheter      Left  . Revison of arteriovenous fistula  05/11/2012    Procedure: REVISON OF ARTERIOVENOUS FISTULA;  Surgeon: Sherren Kerns, MD;  Location: [facility] OR;  Service: Vascular;  Laterality: Right;   HPI:  72 year old female referred for outpatient MBS (and regular barium swallow) due to globus sensation and choking/coughing spells. PMH significant for parathyroid disorder, kidney dysfunction on HD, reflux (on prilosec), hiatal hernia per pt.  No history of CVA, Parkinsons or head/neck CA per pt.     Assessment / Plan / Recommendation Clinical Impression  Dysphagia Diagnosis: Within Functional Limits Clinical impression: Normal oropharyngeal swallow function. Oral prep and propulsion were WFL, swallow reflex triggered at tongue base across consistencies.  No airway penetration or aspiration, and no post swallow  residue observed. A/P view did not reveal significnat asymmetry.  Pt reported taking Prilosec for reflux.  Encouraged to take this medication 30-60 minutes prior to po intake    Treatment Recommendation  No treatment recommended at this time    Diet Recommendation Regular;Thin liquid   Liquid Administration via: Cup;Straw Medication Administration: Whole meds with liquid Supervision: Patient able to self feed Compensations: Slow rate;Small sips/bites Postural Changes and/or Swallow Maneuvers: Seated upright 90 degrees    Other  Recommendations Recommended Consults: Other (Comment) (Recommend work up for possible LPR.) Oral Care Recommendations: Oral care BID   Follow Up Recommendations  None       Pertinent Vitals/Pain No pain reported     General Date of Onset: 09/30/12 HPI: 72 year old female referred for outpatient MBS (and regular barium swallow) due to globus sensation and choking/coughing spells. PMH significant for parathyroid disorder, kidney dysfunction on HD, reflux (on prilosec), hiatal hernia per pt.  No history of CVA, Parkinsons or head/neck CA per pt. Type of Study: Modified Barium Swallowing Study Reason for Referral: Objectively evaluate swallowing function Diet Prior to this Study: Regular;Thin liquids Temperature Spikes Noted: No Respiratory Status: Room air History of Recent Intubation: No Behavior/Cognition: Alert;Cooperative;Pleasant mood Oral Cavity - Dentition: Adequate natural dentition Oral Motor / Sensory Function: Within functional limits Self-Feeding Abilities: Able to feed self Patient Positioning: Upright in chair Baseline Vocal Quality: Clear Volitional Cough: Strong Volitional  Swallow: Able to elicit Anatomy: Within functional limits Pharyngeal Secretions: Not observed secondary MBS    Reason for Referral Objectively evaluate swallowing function   Oral Phase Oral Preparation/Oral Phase Oral Phase: WFL   Pharyngeal Phase Pharyngeal  Phase Pharyngeal Phase: Within functional limits  Cervical Esophageal Phase       Cervical Esophageal Phase Cervical Esophageal Phase: [facility]    Functional Assessment Tool Used: Objective study (Modified Barium Swallow) Functional Limitations: Swallowing Swallow Current Status (Z6109): 0 percent impaired, limited or restricted Swallow Goal Status (U0454): 0 percent impaired, limited or restricted Swallow Discharge Status (U9811): 0 percent impaired, limited or restricted   Celia B. Murvin [facility], CCC-SLP [phone] Leigh Aurora 09/30/2012, 3:12 PM

## 2012-10-01 DIAGNOSIS — N2581 Secondary hyperparathyroidism of renal origin: Secondary | ICD-10-CM | POA: Diagnosis not present

## 2012-10-01 DIAGNOSIS — D631 Anemia in chronic kidney disease: Secondary | ICD-10-CM | POA: Diagnosis not present

## 2012-10-01 DIAGNOSIS — N039 Chronic nephritic syndrome with unspecified morphologic changes: Secondary | ICD-10-CM | POA: Diagnosis not present

## 2012-10-01 DIAGNOSIS — N186 End stage renal disease: Secondary | ICD-10-CM | POA: Diagnosis not present

## 2012-10-01 DIAGNOSIS — D509 Iron deficiency anemia, unspecified: Secondary | ICD-10-CM | POA: Diagnosis not present

## 2012-10-04 ENCOUNTER — Other Ambulatory Visit: Payer: Self-pay

## 2012-10-04 ENCOUNTER — Telehealth: Payer: Self-pay | Admitting: Family Medicine

## 2012-10-04 DIAGNOSIS — D631 Anemia in chronic kidney disease: Secondary | ICD-10-CM | POA: Diagnosis not present

## 2012-10-04 DIAGNOSIS — N2581 Secondary hyperparathyroidism of renal origin: Secondary | ICD-10-CM | POA: Diagnosis not present

## 2012-10-04 DIAGNOSIS — R070 Pain in throat: Secondary | ICD-10-CM

## 2012-10-04 DIAGNOSIS — N186 End stage renal disease: Secondary | ICD-10-CM | POA: Diagnosis not present

## 2012-10-04 DIAGNOSIS — D509 Iron deficiency anemia, unspecified: Secondary | ICD-10-CM | POA: Diagnosis not present

## 2012-10-04 MED ORDER — OMEPRAZOLE 20 MG PO CPDR: 40.0000 mg | DELAYED_RELEASE_CAPSULE | Freq: Two times a day (BID) | ORAL | Status: DC

## 2012-10-04 NOTE — Telephone Encounter (Signed)
Patient was referred to GI, Dr. Russella Dar who wants her to go to an ENT, but she needs a referral from her PCP and wants it to go to Dr. Pollyann Kennedy.  She is scheduled to see Dr. Pollyann Kennedy on 5/16.

## 2012-10-04 NOTE — Telephone Encounter (Signed)
ENT for evaluation of what?

## 2012-10-06 ENCOUNTER — Telehealth: Payer: Self-pay | Admitting: *Deleted

## 2012-10-06 DIAGNOSIS — N2581 Secondary hyperparathyroidism of renal origin: Secondary | ICD-10-CM | POA: Diagnosis not present

## 2012-10-06 DIAGNOSIS — D631 Anemia in chronic kidney disease: Secondary | ICD-10-CM | POA: Diagnosis not present

## 2012-10-06 DIAGNOSIS — N186 End stage renal disease: Secondary | ICD-10-CM | POA: Diagnosis not present

## 2012-10-06 DIAGNOSIS — D509 Iron deficiency anemia, unspecified: Secondary | ICD-10-CM | POA: Diagnosis not present

## 2012-10-06 NOTE — Telephone Encounter (Signed)
NPI # given to [name] per request after verifying that she was a current pt. Kingstyn Deruiter, Harold Hedge, RN

## 2012-10-08 DIAGNOSIS — N2581 Secondary hyperparathyroidism of renal origin: Secondary | ICD-10-CM | POA: Diagnosis not present

## 2012-10-08 DIAGNOSIS — D509 Iron deficiency anemia, unspecified: Secondary | ICD-10-CM | POA: Diagnosis not present

## 2012-10-08 DIAGNOSIS — D631 Anemia in chronic kidney disease: Secondary | ICD-10-CM | POA: Diagnosis not present

## 2012-10-08 DIAGNOSIS — N186 End stage renal disease: Secondary | ICD-10-CM | POA: Diagnosis not present

## 2012-10-11 DIAGNOSIS — N039 Chronic nephritic syndrome with unspecified morphologic changes: Secondary | ICD-10-CM | POA: Diagnosis not present

## 2012-10-11 DIAGNOSIS — D509 Iron deficiency anemia, unspecified: Secondary | ICD-10-CM | POA: Diagnosis not present

## 2012-10-11 DIAGNOSIS — N186 End stage renal disease: Secondary | ICD-10-CM | POA: Diagnosis not present

## 2012-10-11 DIAGNOSIS — N2581 Secondary hyperparathyroidism of renal origin: Secondary | ICD-10-CM | POA: Diagnosis not present

## 2012-10-12 ENCOUNTER — Other Ambulatory Visit: Payer: Self-pay | Admitting: Family Medicine

## 2012-10-12 DIAGNOSIS — N19 Unspecified kidney failure: Secondary | ICD-10-CM | POA: Diagnosis not present

## 2012-10-12 DIAGNOSIS — F17200 Nicotine dependence, unspecified, uncomplicated: Secondary | ICD-10-CM | POA: Diagnosis not present

## 2012-10-12 DIAGNOSIS — Q398 Other congenital malformations of esophagus: Secondary | ICD-10-CM | POA: Diagnosis not present

## 2012-10-12 DIAGNOSIS — R1310 Dysphagia, unspecified: Secondary | ICD-10-CM

## 2012-10-12 DIAGNOSIS — K219 Gastro-esophageal reflux disease without esophagitis: Secondary | ICD-10-CM | POA: Diagnosis not present

## 2012-10-12 NOTE — Telephone Encounter (Signed)
Referral placed in EPIC 

## 2012-10-13 ENCOUNTER — Other Ambulatory Visit (HOSPITAL_COMMUNITY): Payer: Self-pay | Admitting: Nephrology

## 2012-10-13 DIAGNOSIS — N039 Chronic nephritic syndrome with unspecified morphologic changes: Secondary | ICD-10-CM | POA: Diagnosis not present

## 2012-10-13 DIAGNOSIS — R031 Nonspecific low blood-pressure reading: Secondary | ICD-10-CM

## 2012-10-13 DIAGNOSIS — N186 End stage renal disease: Secondary | ICD-10-CM | POA: Diagnosis not present

## 2012-10-13 DIAGNOSIS — N2581 Secondary hyperparathyroidism of renal origin: Secondary | ICD-10-CM | POA: Diagnosis not present

## 2012-10-13 DIAGNOSIS — D509 Iron deficiency anemia, unspecified: Secondary | ICD-10-CM | POA: Diagnosis not present

## 2012-10-15 DIAGNOSIS — N186 End stage renal disease: Secondary | ICD-10-CM | POA: Diagnosis not present

## 2012-10-15 DIAGNOSIS — D509 Iron deficiency anemia, unspecified: Secondary | ICD-10-CM | POA: Diagnosis not present

## 2012-10-15 DIAGNOSIS — N2581 Secondary hyperparathyroidism of renal origin: Secondary | ICD-10-CM | POA: Diagnosis not present

## 2012-10-15 DIAGNOSIS — N039 Chronic nephritic syndrome with unspecified morphologic changes: Secondary | ICD-10-CM | POA: Diagnosis not present

## 2012-10-18 DIAGNOSIS — D509 Iron deficiency anemia, unspecified: Secondary | ICD-10-CM | POA: Diagnosis not present

## 2012-10-18 DIAGNOSIS — N2581 Secondary hyperparathyroidism of renal origin: Secondary | ICD-10-CM | POA: Diagnosis not present

## 2012-10-18 DIAGNOSIS — N186 End stage renal disease: Secondary | ICD-10-CM | POA: Diagnosis not present

## 2012-10-18 DIAGNOSIS — D631 Anemia in chronic kidney disease: Secondary | ICD-10-CM | POA: Diagnosis not present

## 2012-10-19 ENCOUNTER — Other Ambulatory Visit (HOSPITAL_COMMUNITY): Payer: Medicare Other

## 2012-10-20 ENCOUNTER — Other Ambulatory Visit (HOSPITAL_COMMUNITY): Payer: Self-pay | Admitting: Nephrology

## 2012-10-20 DIAGNOSIS — N186 End stage renal disease: Secondary | ICD-10-CM

## 2012-10-21 ENCOUNTER — Ambulatory Visit (HOSPITAL_COMMUNITY): Payer: Medicare Other

## 2012-10-21 ENCOUNTER — Other Ambulatory Visit (HOSPITAL_COMMUNITY): Payer: Self-pay | Admitting: Nephrology

## 2012-10-21 ENCOUNTER — Ambulatory Visit (HOSPITAL_COMMUNITY)
Admission: RE | Admit: 2012-10-21 | Discharge: 2012-10-21 | Disposition: A | Discharge status: Home / Self Care | Payer: Medicare Other | Source: Ambulatory Visit | Attending: Nephrology | Admitting: Nephrology

## 2012-10-21 VITALS — BP 186/84 | HR 71 | Resp 14

## 2012-10-21 DIAGNOSIS — E079 Disorder of thyroid, unspecified: Secondary | ICD-10-CM | POA: Insufficient documentation

## 2012-10-21 DIAGNOSIS — G2581 Restless legs syndrome: Secondary | ICD-10-CM | POA: Insufficient documentation

## 2012-10-21 DIAGNOSIS — Z9109 Other allergy status, other than to drugs and biological substances: Secondary | ICD-10-CM | POA: Insufficient documentation

## 2012-10-21 DIAGNOSIS — Z888 Allergy status to other drugs, medicaments and biological substances status: Secondary | ICD-10-CM | POA: Insufficient documentation

## 2012-10-21 DIAGNOSIS — N186 End stage renal disease: Secondary | ICD-10-CM | POA: Diagnosis not present

## 2012-10-21 DIAGNOSIS — K589 Irritable bowel syndrome without diarrhea: Secondary | ICD-10-CM | POA: Insufficient documentation

## 2012-10-21 DIAGNOSIS — Z885 Allergy status to narcotic agent status: Secondary | ICD-10-CM | POA: Diagnosis not present

## 2012-10-21 DIAGNOSIS — I120 Hypertensive chronic kidney disease with stage 5 chronic kidney disease or end stage renal disease: Secondary | ICD-10-CM | POA: Diagnosis not present

## 2012-10-21 DIAGNOSIS — T82898A Other specified complication of vascular prosthetic devices, implants and grafts, initial encounter: Secondary | ICD-10-CM | POA: Insufficient documentation

## 2012-10-21 DIAGNOSIS — M129 Arthropathy, unspecified: Secondary | ICD-10-CM | POA: Insufficient documentation

## 2012-10-21 DIAGNOSIS — I82609 Acute embolism and thrombosis of unspecified veins of unspecified upper extremity: Secondary | ICD-10-CM | POA: Insufficient documentation

## 2012-10-21 DIAGNOSIS — D649 Anemia, unspecified: Secondary | ICD-10-CM | POA: Insufficient documentation

## 2012-10-21 DIAGNOSIS — Y832 Surgical operation with anastomosis, bypass or graft as the cause of abnormal reaction of the patient, or of later complication, without mention of misadventure at the time of the procedure: Secondary | ICD-10-CM | POA: Insufficient documentation

## 2012-10-21 DIAGNOSIS — E785 Hyperlipidemia, unspecified: Secondary | ICD-10-CM | POA: Insufficient documentation

## 2012-10-21 DIAGNOSIS — K219 Gastro-esophageal reflux disease without esophagitis: Secondary | ICD-10-CM | POA: Insufficient documentation

## 2012-10-21 DIAGNOSIS — Z7982 Long term (current) use of aspirin: Secondary | ICD-10-CM | POA: Diagnosis not present

## 2012-10-21 DIAGNOSIS — Z882 Allergy status to sulfonamides status: Secondary | ICD-10-CM | POA: Diagnosis not present

## 2012-10-21 DIAGNOSIS — Z79899 Other long term (current) drug therapy: Secondary | ICD-10-CM | POA: Diagnosis not present

## 2012-10-21 DIAGNOSIS — Z880 Allergy status to penicillin: Secondary | ICD-10-CM | POA: Insufficient documentation

## 2012-10-21 DIAGNOSIS — Z992 Dependence on renal dialysis: Secondary | ICD-10-CM | POA: Insufficient documentation

## 2012-10-21 LAB — POTASSIUM: Potassium: 5 mEq/L (ref 3.5–5.1)

## 2012-10-21 MED ORDER — FENTANYL CITRATE 0.05 MG/ML IJ SOLN
INTRAMUSCULAR | Status: DC | PRN
  Administered 2012-10-21: 50 ug via INTRAVENOUS
  Administered 2012-10-21: 25 ug via INTRAVENOUS
  Administered 2012-10-21: 50 ug via INTRAVENOUS
  Administered 2012-10-21: 25 ug via INTRAVENOUS
  Administered 2012-10-21 (×2): 50 ug via INTRAVENOUS
  Administered 2012-10-21: 25 ug via INTRAVENOUS

## 2012-10-21 MED ORDER — ALTEPLASE 100 MG IV SOLR
2.0000 mg | Freq: Once | INTRAVENOUS | Status: DC
  Filled 2012-10-21: qty 2

## 2012-10-21 MED ORDER — MIDAZOLAM HCL 2 MG/2ML IJ SOLN
INTRAMUSCULAR | Status: AC
  Filled 2012-10-21: qty 6

## 2012-10-21 MED ORDER — IOHEXOL 300 MG/ML  SOLN
100.0000 mL | Freq: Once | INTRAMUSCULAR | Status: AC | PRN
  Administered 2012-10-21: 50 mL via INTRAVENOUS

## 2012-10-21 MED ORDER — FENTANYL CITRATE 0.05 MG/ML IJ SOLN
INTRAMUSCULAR | Status: AC
  Filled 2012-10-21: qty 2

## 2012-10-21 MED ORDER — VANCOMYCIN HCL IN DEXTROSE 1-5 GM/200ML-% IV SOLN
1000.0000 mg | Freq: Once | INTRAVENOUS | Status: AC
  Administered 2012-10-21: 1000 mg via INTRAVENOUS
  Filled 2012-10-21: qty 200

## 2012-10-21 MED ORDER — HEPARIN SODIUM (PORCINE) 1000 UNIT/ML IJ SOLN
INTRAMUSCULAR | Status: DC | PRN
  Administered 2012-10-21: 3000 [IU] via INTRAVENOUS

## 2012-10-21 MED ORDER — ALTEPLASE 100 MG IV SOLR
INTRAVENOUS | Status: DC | PRN
  Administered 2012-10-21: 2 mg

## 2012-10-21 MED ORDER — MIDAZOLAM HCL 2 MG/2ML IJ SOLN
INTRAMUSCULAR | Status: DC | PRN
  Administered 2012-10-21 (×4): 1 mg via INTRAVENOUS
  Administered 2012-10-21 (×2): 0.5 mg via INTRAVENOUS

## 2012-10-21 MED ORDER — HEPARIN SODIUM (PORCINE) 1000 UNIT/ML IJ SOLN
INTRAMUSCULAR | Status: AC
  Filled 2012-10-21: qty 1

## 2012-10-21 MED ORDER — FENTANYL CITRATE 0.05 MG/ML IJ SOLN
INTRAMUSCULAR | Status: AC
  Filled 2012-10-21: qty 4

## 2012-10-21 NOTE — ED Notes (Signed)
Emesis sm amt after eating

## 2012-10-21 NOTE — ED Notes (Signed)
C. Lyles notified of pt status with new tunneled catheter just placed, pt unable to attend HD, will continue with that in am, and avoid foods rich in potassium

## 2012-10-21 NOTE — ED Notes (Signed)
K. Allred, PA to see pt for consent and H&P pre procedure

## 2012-10-21 NOTE — ED Notes (Signed)
Change to insertion of tunneled HD cath

## 2012-10-21 NOTE — ED Notes (Signed)
Pt does not have IV access- MD aware- will give pt sedation meds during case. Numbing arm at this time to gain access.

## 2012-10-21 NOTE — Procedures (Signed)
Attempted declot of R HD fistula Tunnelled L IJ HD catheter placement No complication No blood loss. See complete dictation in [facility].

## 2012-10-21 NOTE — H&P (Signed)
[name] is an 72 y.o. female.   Chief Complaint: clotted right arm dialysis fistula HPI: Pt with ESRD and thrombosed right arm AVF presents today for thrombolysis, possible angioplasty/stenting of fistula or placement of new catheter if needed.  Past Medical History  Diagnosis Date  . Hyperlipidemia   . Hypertension   . Normal cardiac stress test 12/24/2009    lexiscan, imaging normal  . 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 Diverticulitis   . IBS (irritable bowel syndrome)   . Thyroid disease   . Depression   . Chronic kidney disease     Hemo MWF  . GERD (gastroesophageal reflux disease)   . H/O hiatal hernia   . Arthritis   . RLS (restless legs syndrome)   . Seizures   . Tubular adenoma of colon 01/2008    Past Surgical History  Procedure Laterality Date  . Cardiac catheterization  2003    normal  . Cholecystectomy      Open mid line incision  . Frontal craniotomy  2002    indication = sinusitis  . Appendectomy    . Tubal ligation    . Abdominal hysterectomy    . Insertion of dialysis catheter      Left  . Revison of arteriovenous fistula  05/11/2012    Procedure: REVISON OF ARTERIOVENOUS FISTULA;  Surgeon: Sherren Kerns, MD;  Location: [facility] OR;  Service: Vascular;  Laterality: Right;    Family History  Problem Relation Age of Onset  . Thyroid cancer Mother   . Heart disease Father   . Hypertension Father   . Breast cancer Sister    Social History:  reports that she has been smoking Cigarettes.  She has a 30 pack-year smoking history. She has never used smokeless tobacco. She reports that she does not drink alcohol or use illicit drugs.  Allergies:  Allergies  Allergen Reactions  . Codeine Other (See Comments)    unknown  . Pregabalin Nausea And Vomiting    confusion  . Tuberculin Tests Hives    "blisters"  . Penicillins Rash    No problems breathing. Has tolerated omnicef in past without issue  .  Sulfamethoxazole Rash    Current outpatient prescriptions:acetaminophen (TYLENOL) 500 MG tablet, Take 500 mg by mouth every 6 (six) hours as needed. For pain, Disp: , Rfl: ;  allopurinol (ZYLOPRIM) 100 MG tablet, Only take on days following dialysis (Tues, Thurs, Sat)., Disp: 30 tablet, Rfl: 6;  amLODipine (NORVASC) 10 MG tablet, Take 1 tablet (10 mg total) by mouth daily., Disp: 90 tablet, Rfl: 3 ARIPiprazole (ABILIFY) 2 MG tablet, Take 1 mg by mouth every morning., Disp: , Rfl: ;  aspirin EC 81 MG tablet, Take 81 mg by mouth every morning., Disp: , Rfl: ;  atorvastatin (LIPITOR) 40 MG tablet, Take 1 tablet (40 mg total) by mouth every morning., Disp: 30 tablet, Rfl: 6;  calcium acetate (PHOSLO) 667 MG capsule, Take 667 mg by mouth 3 (three) times daily with meals. , Disp: , Rfl:  Calcium Carbonate Antacid (TUMS PO), Take 1 tablet by mouth daily as needed. For stomach upset, Disp: , Rfl: ;  cloNIDine (CATAPRES) 0.1 MG tablet, Take 1 tablet (0.1 mg total) by mouth 3 (three) times daily., Disp: 90 tablet, Rfl: 3;  cyclobenzaprine (FLEXERIL) 5 MG tablet, Take 1 tablet (5 mg total) by mouth 3 (three) times daily as needed for muscle spasms., Disp: 30  tablet, Rfl: 1 fluticasone (FLONASE) 50 MCG/ACT nasal spray, Place 2 sprays into the nose daily., Disp: 16 g, Rfl: 6;  folic acid-vitamin b complex-vitamin c-selenium-zinc (DIALYVITE) 3 MG TABS, Take 1 tablet by mouth daily., Disp: , Rfl: ;  hydrocortisone cream 1 %, Apply topically daily as needed., Disp: 120 g, Rfl: 0;  lidocaine-prilocaine (EMLA) cream, Apply 1 application topically 3 (three) times a week. Use before dialysis on MWF, Disp: , Rfl:  loratadine (CLARITIN) 10 MG tablet, Take 1 tablet (10 mg total) by mouth daily., Disp: 30 tablet, Rfl: 6;  metoprolol succinate (TOPROL-XL) 100 MG 24 hr tablet, Take 100 mg by mouth every evening. Take with or immediately following a meal., Disp: , Rfl: ;  metroNIDAZOLE (METROGEL) 0.75 % vaginal gel, Place 1  Applicatorful vaginally 2 (two) times daily. For 5 days., Disp: 70 g, Rfl: 0 mirtazapine (REMERON) 7.5 MG tablet, Take 7.5 mg by mouth at bedtime. , Disp: , Rfl: ;  omeprazole (PRILOSEC) 20 MG capsule, Take 2 capsules (40 mg total) by mouth 2 (two) times daily., Disp: 60 capsule, Rfl: 6;  polyethylene glycol powder (GLYCOLAX/MIRALAX) powder, Take 17 g by mouth daily as needed. For constipation, Disp: , Rfl: ;  senna (SENOKOT) 8.6 MG tablet, Take 2 tablets by mouth daily as needed. For constipation, Disp: , Rfl:  fluconazole (DIFLUCAN) 150 MG tablet, Take 1 tablet (150 mg total) by mouth every 3 (three) days., Disp: 3 tablet, Rfl: 0 Current facility-administered medications:alteplase (ACTIVASE) injection 2 mg, 2 mg, Intracatheter, Once, Dayne Oley Balm III, MD  Results for orders placed in visit on 09/14/12  POCT WET PREP (WET MOUNT)      Result Value Range   Source Wet Prep POC VAG     WBC, Wet Prep HPF POC RARE     Bacteria Wet Prep HPF POC TRACE COCCI     Clue Cells Wet Prep HPF POC None     CLUE CELLS WET PREP WHIFF POC Negative Whiff     Yeast Wet Prep HPF POC Few     Trichomonas Wet Prep HPF POC None       No results found.  Review of Systems  Constitutional: Negative for fever and chills.  Respiratory: Negative for shortness of breath.        Occ cough  Cardiovascular: Negative for chest pain.  Gastrointestinal: Negative for nausea, vomiting and abdominal pain.  Musculoskeletal: Negative for back pain.  Neurological: Negative for headaches.    Blood pressure 149/63, pulse 73, resp. rate 15, SpO2 100.00%. Physical Exam  Constitutional: She is oriented to person, place, and time. She appears well-developed and well-nourished.  Cardiovascular: Normal rate and regular rhythm.   Rt arm AVF with no thrill/bruit  Respiratory: Effort normal and breath sounds normal.  GI: Soft. Bowel sounds are normal. There is no tenderness.  Musculoskeletal: Normal range of motion. She  exhibits no edema.  Neurological: She is alert and oriented to person, place, and time.     Assessment/Plan Pt with ESRD and thrombosed right arm AVF. Plan is for thrombolysis with possible angioplasty/stenting of fistula or placement of new catheter if needed. Details of above d/w pt /daughter with their understanding and consent.  ALLRED,D KEVIN 10/21/2012, 12:24 PM

## 2012-10-22 ENCOUNTER — Telehealth: Payer: Self-pay | Admitting: *Deleted

## 2012-10-22 DIAGNOSIS — N2581 Secondary hyperparathyroidism of renal origin: Secondary | ICD-10-CM | POA: Diagnosis not present

## 2012-10-22 DIAGNOSIS — D509 Iron deficiency anemia, unspecified: Secondary | ICD-10-CM | POA: Diagnosis not present

## 2012-10-22 DIAGNOSIS — N186 End stage renal disease: Secondary | ICD-10-CM | POA: Diagnosis not present

## 2012-10-22 DIAGNOSIS — D631 Anemia in chronic kidney disease: Secondary | ICD-10-CM | POA: Diagnosis not present

## 2012-10-22 NOTE — Telephone Encounter (Signed)
Gerome Apley called to say [name]'s fistula clotted yesterday. IR attempted to declot but was unsuccessful so inserted a hemodialysis catheter. [name] wanted to know our next step. Per Dr Myra Gianotti we need to bring her in to see someone next week to see if they can declot it or she needs a new graft.Routed to front desk to make appt next week. She dialyzes at [facility] M,W,F.

## 2012-10-25 DIAGNOSIS — N2581 Secondary hyperparathyroidism of renal origin: Secondary | ICD-10-CM | POA: Diagnosis not present

## 2012-10-25 DIAGNOSIS — N186 End stage renal disease: Secondary | ICD-10-CM | POA: Diagnosis not present

## 2012-10-25 DIAGNOSIS — D509 Iron deficiency anemia, unspecified: Secondary | ICD-10-CM | POA: Diagnosis not present

## 2012-10-25 DIAGNOSIS — N039 Chronic nephritic syndrome with unspecified morphologic changes: Secondary | ICD-10-CM | POA: Diagnosis not present

## 2012-10-26 ENCOUNTER — Encounter: Payer: Self-pay | Admitting: Vascular Surgery

## 2012-10-27 ENCOUNTER — Other Ambulatory Visit: Payer: Self-pay | Admitting: *Deleted

## 2012-10-27 ENCOUNTER — Encounter: Payer: Self-pay | Admitting: *Deleted

## 2012-10-27 ENCOUNTER — Encounter: Payer: Self-pay | Admitting: Vascular Surgery

## 2012-10-27 ENCOUNTER — Ambulatory Visit (INDEPENDENT_AMBULATORY_CARE_PROVIDER_SITE_OTHER): Payer: Medicare Other | Admitting: Vascular Surgery

## 2012-10-27 VITALS — BP 143/60 | HR 91 | Temp 99.0°F | Ht 61.0 in | Wt 162.6 lb

## 2012-10-27 DIAGNOSIS — N186 End stage renal disease: Secondary | ICD-10-CM | POA: Diagnosis not present

## 2012-10-27 DIAGNOSIS — D509 Iron deficiency anemia, unspecified: Secondary | ICD-10-CM | POA: Diagnosis not present

## 2012-10-27 DIAGNOSIS — D631 Anemia in chronic kidney disease: Secondary | ICD-10-CM | POA: Diagnosis not present

## 2012-10-27 DIAGNOSIS — N2581 Secondary hyperparathyroidism of renal origin: Secondary | ICD-10-CM | POA: Diagnosis not present

## 2012-10-27 NOTE — Progress Notes (Signed)
[facility] [place]  Patient name: [name] MRN: [id number] DOB: 06/10/1940 Sex: female  REASON FOR CONSULT: evaluate for new access.  HPI: [name] is a 71 y.o. female dialyzes on Monday Wednesdays and Fridays. She had a right brachiocephalic fistula placed on 06/12/2010. She required revision of her right upper arm fistula on 05/11/2012. A segment of 6 mm PTFE graft was extended up into the axilla. Her graft subsequently clotted most recently on 10/21/2012 and interventional radiology was not able to salvage the graft. They placed a left IJ tunneled dialysis catheter and she was sent to be evaluated for new access. She states that she has had a low-grade fever.  Past Medical History  Diagnosis Date  . Hyperlipidemia   . Hypertension   . Normal cardiac stress test 12/24/2009    lexiscan, imaging normal  . ANEMIA NEC 03/31/2007    Qualifier: Diagnosis of  By: GRANDIS MD, [name]    . Diverticulitis   . IBS (irritable bowel syndrome)   . Thyroid disease   . Depression   . Chronic kidney disease     Hemo MWF  . GERD (gastroesophageal reflux disease)   . H/O hiatal hernia   . Arthritis   . RLS (restless legs syndrome)   . Seizures   . Tubular adenoma of colon 01/2008   Family History  Problem Relation Age of Onset  . Thyroid cancer Mother   . Heart disease Father   . Hypertension Father   . Breast cancer Sister    SOCIAL HISTORY: History  Substance Use Topics  . Smoking status: Current Every Day Smoker -- 0.50 packs/day for 60 years    Types: Cigarettes  . Smokeless tobacco: Never Used  . Alcohol Use: No   Allergies  Allergen Reactions  . Codeine Other (See Comments)    unknown  . Pregabalin Nausea And Vomiting    confusion  . Tuberculin Tests Hives    "blisters"  . Penicillins Rash    No problems breathing. Has tolerated omnicef in past without issue  . Sulfamethoxazole Rash   Current Outpatient Prescriptions  Medication Sig Dispense  Refill  . acetaminophen (TYLENOL) 500 MG tablet Take 500 mg by mouth every 6 (six) hours as needed. For pain      . allopurinol (ZYLOPRIM) 100 MG tablet Only take on days following dialysis (Tues, Thurs, Sat).  30 tablet  6  . amLODipine (NORVASC) 10 MG tablet Take 1 tablet (10 mg total) by mouth daily.  90 tablet  3  . ARIPiprazole (ABILIFY) 2 MG tablet Take 1 mg by mouth every morning.      . aspirin EC 81 MG tablet Take 81 mg by mouth every morning.      . atorvastatin (LIPITOR) 40 MG tablet Take 1 tablet (40 mg total) by mouth every morning.  30 tablet  6  . calcium acetate (PHOSLO) 667 MG capsule Take 667 mg by mouth 3 (three) times daily with meals.       . Calcium Carbonate Antacid (TUMS PO) Take 1 tablet by mouth daily as needed. For stomach upset      . cloNIDine (CATAPRES) 0.1 MG tablet Take 1 tablet (0.1 mg total) by mouth 3 (three) times daily.  90 tablet  3  . cyclobenzaprine (FLEXERIL) 5 MG tablet Take 1 tablet (5 mg total) by mouth 3 (three) times daily as needed for muscle spasms.  30 tablet  1  . fluconazole (DIFLUCAN) 150 MG   tablet Take 1 tablet (150 mg total) by mouth every 3 (three) days.  3 tablet  0  . fluticasone (FLONASE) 50 MCG/ACT nasal spray Place 2 sprays into the nose daily.  16 g  6  . folic acid-vitamin b complex-vitamin c-selenium-zinc (DIALYVITE) 3 MG TABS Take 1 tablet by mouth daily.      . hydrocortisone cream 1 % Apply topically daily as needed.  120 g  0  . lidocaine-prilocaine (EMLA) cream Apply 1 application topically 3 (three) times a week. Use before dialysis on MWF      . loratadine (CLARITIN) 10 MG tablet Take 1 tablet (10 mg total) by mouth daily.  30 tablet  6  . metoprolol succinate (TOPROL-XL) 100 MG 24 hr tablet Take 100 mg by mouth every evening. Take with or immediately following a meal.      . metroNIDAZOLE (METROGEL) 0.75 % vaginal gel Place 1 Applicatorful vaginally 2 (two) times daily. For 5 days.  70 g  0  . mirtazapine (REMERON) 7.5 MG  tablet Take 7.5 mg by mouth at bedtime.       . omeprazole (PRILOSEC) 20 MG capsule Take 2 capsules (40 mg total) by mouth 2 (two) times daily.  60 capsule  6  . polyethylene glycol powder (GLYCOLAX/MIRALAX) powder Take 17 g by mouth daily as needed. For constipation      . senna (SENOKOT) 8.6 MG tablet Take 2 tablets by mouth daily as needed. For constipation       No current facility-administered medications for this visit.   REVIEW OF SYSTEMS: [X ] denotes positive finding; [  ] denotes negative finding  CARDIOVASCULAR:  [ ] chest pain   [ ] chest pressure   [ ] palpitations   [ ] orthopnea   [ ] dyspnea on exertion   [ ] claudication   [ ] rest pain   [ ] DVT   [ ] phlebitis PULMONARY:   [ ] productive cough   [ ] asthma   [ ] wheezing NEUROLOGIC:   [ ] weakness  [ ] paresthesias  [ ] aphasia  [ ] amaurosis  [ ] dizziness HEMATOLOGIC:   [ ] bleeding problems   [ ] clotting disorders MUSCULOSKELETAL:  [ ] joint pain   [ ] joint swelling [ ] leg swelling GASTROINTESTINAL: [ ]  blood in stool  [ ]  hematemesis GENITOURINARY:  [ ]  dysuria  [ ]  hematuria PSYCHIATRIC:  [ ] history of major depression INTEGUMENTARY:  [ ] rashes  [ ] ulcers CONSTITUTIONAL:  [X ] fever   [ ] chills  PHYSICAL EXAM: Filed Vitals:   10/27/12 1428  BP: 143/60  Pulse: 91  Temp: 99 F (37.2 C)  TempSrc: Oral  Height: 5' 1" (1.549 m)  Weight: 162 lb 9.6 oz (73.755 kg)  SpO2: 100%   Body mass index is 30.74 kg/(m^2). GENERAL: The patient is a well-nourished female, in no acute distress. The vital signs are documented above. CARDIOVASCULAR: There is a regular rate and rhythm. He has palpable radial pulses bilaterally. PULMONARY: There is good air exchange bilaterally without wheezing or rales. ABDOMEN: Soft and non-tender with normal pitched bowel sounds.  MUSCULOSKELETAL: There are no major deformities or cyanosis. NEUROLOGIC: No focal weakness or paresthesias are detected. SKIN: There are no ulcers or  rashes noted. PSYCHIATRIC: The patient has a normal affect. Her right upper arm fistula is occluded. She has moderate swelling just above the antecubital area   which is mildly tender.  DATA:  I reviewed her procedure by interventional radiology on 10/21/2012. 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  MEDICAL ISSUES:  I would recommend placement of new access in the left arm. She will return next week for vein mapping on the left. Do not want to proceed with new access in the left until we're sure that she is not having problems with infection given her low-grade fever and swelling in the right arm. This continues to improve then we will plan on placement of a left AV fistula or AV graft in early June.I have explained the indications for placement of an AV fistula or AV graft. I've explained that if at all possible we will place an AV fistula.  I have reviewed the risks of placement of an AV fistula including but not limited to: failure of the fistula to mature, need for subsequent interventions, and thrombosis. In addition I have reviewed the potential complications of placement of an AV graft. These risks include, but are not limited to, graft thrombosis, graft infection, wound healing problems, bleeding, arm swelling, and steal syndrome. All the patient's questions were answered and they are agreeable to proceed with surgery.  Navraj Dreibelbis S [facility] [place] Beeper: [phone]    

## 2012-10-30 DIAGNOSIS — N186 End stage renal disease: Secondary | ICD-10-CM | POA: Diagnosis not present

## 2012-11-01 ENCOUNTER — Other Ambulatory Visit: Payer: Self-pay | Admitting: *Deleted

## 2012-11-01 DIAGNOSIS — N2581 Secondary hyperparathyroidism of renal origin: Secondary | ICD-10-CM | POA: Diagnosis not present

## 2012-11-01 DIAGNOSIS — Z48812 Encounter for surgical aftercare following surgery on the circulatory system: Secondary | ICD-10-CM

## 2012-11-01 DIAGNOSIS — N186 End stage renal disease: Secondary | ICD-10-CM | POA: Diagnosis not present

## 2012-11-01 DIAGNOSIS — D631 Anemia in chronic kidney disease: Secondary | ICD-10-CM | POA: Diagnosis not present

## 2012-11-04 ENCOUNTER — Encounter (INDEPENDENT_AMBULATORY_CARE_PROVIDER_SITE_OTHER): Payer: Medicare Other | Admitting: *Deleted

## 2012-11-04 DIAGNOSIS — Z48812 Encounter for surgical aftercare following surgery on the circulatory system: Secondary | ICD-10-CM

## 2012-11-04 DIAGNOSIS — N186 End stage renal disease: Secondary | ICD-10-CM | POA: Diagnosis not present

## 2012-11-08 ENCOUNTER — Encounter (HOSPITAL_COMMUNITY): Payer: Self-pay | Admitting: Pharmacy Technician

## 2012-11-09 DIAGNOSIS — F333 Major depressive disorder, recurrent, severe with psychotic symptoms: Secondary | ICD-10-CM | POA: Diagnosis not present

## 2012-11-10 ENCOUNTER — Encounter (HOSPITAL_COMMUNITY): Payer: Self-pay | Admitting: *Deleted

## 2012-11-10 MED ORDER — VANCOMYCIN HCL 10 G IV SOLR
1500.0000 mg | INTRAVENOUS | Status: AC
  Administered 2012-11-11: 1500 mg via INTRAVENOUS
  Filled 2012-11-10: qty 1500

## 2012-11-10 MED ORDER — SODIUM CHLORIDE 0.9 % IV SOLN
INTRAVENOUS | Status: DC
  Administered 2012-11-11: 14:00:00 via INTRAVENOUS
  Administered 2012-11-11: 20 mL/h via INTRAVENOUS

## 2012-11-11 ENCOUNTER — Encounter (HOSPITAL_COMMUNITY): Payer: Self-pay | Admitting: Certified Registered Nurse Anesthetist

## 2012-11-11 ENCOUNTER — Encounter (HOSPITAL_COMMUNITY): Payer: Self-pay | Admitting: *Deleted

## 2012-11-11 ENCOUNTER — Ambulatory Visit (HOSPITAL_COMMUNITY)
Admission: RE | Admit: 2012-11-11 | Discharge: 2012-11-11 | Disposition: A | Discharge status: Home / Self Care | Payer: Medicare Other | Source: Ambulatory Visit | Attending: Vascular Surgery | Admitting: Vascular Surgery

## 2012-11-11 ENCOUNTER — Encounter (HOSPITAL_COMMUNITY)
Admission: RE | Disposition: A | Discharge status: Home / Self Care | Payer: Self-pay | Source: Ambulatory Visit | Attending: Vascular Surgery

## 2012-11-11 ENCOUNTER — Ambulatory Visit (HOSPITAL_COMMUNITY): Payer: Medicare Other | Admitting: Certified Registered Nurse Anesthetist

## 2012-11-11 DIAGNOSIS — Z888 Allergy status to other drugs, medicaments and biological substances status: Secondary | ICD-10-CM | POA: Insufficient documentation

## 2012-11-11 DIAGNOSIS — D649 Anemia, unspecified: Secondary | ICD-10-CM | POA: Diagnosis not present

## 2012-11-11 DIAGNOSIS — F3289 Other specified depressive episodes: Secondary | ICD-10-CM | POA: Insufficient documentation

## 2012-11-11 DIAGNOSIS — I120 Hypertensive chronic kidney disease with stage 5 chronic kidney disease or end stage renal disease: Secondary | ICD-10-CM | POA: Diagnosis not present

## 2012-11-11 DIAGNOSIS — K589 Irritable bowel syndrome without diarrhea: Secondary | ICD-10-CM | POA: Insufficient documentation

## 2012-11-11 DIAGNOSIS — Z7982 Long term (current) use of aspirin: Secondary | ICD-10-CM | POA: Diagnosis not present

## 2012-11-11 DIAGNOSIS — F329 Major depressive disorder, single episode, unspecified: Secondary | ICD-10-CM | POA: Insufficient documentation

## 2012-11-11 DIAGNOSIS — Z885 Allergy status to narcotic agent status: Secondary | ICD-10-CM | POA: Diagnosis not present

## 2012-11-11 DIAGNOSIS — Z79899 Other long term (current) drug therapy: Secondary | ICD-10-CM | POA: Diagnosis not present

## 2012-11-11 DIAGNOSIS — E079 Disorder of thyroid, unspecified: Secondary | ICD-10-CM | POA: Insufficient documentation

## 2012-11-11 DIAGNOSIS — F17200 Nicotine dependence, unspecified, uncomplicated: Secondary | ICD-10-CM | POA: Insufficient documentation

## 2012-11-11 DIAGNOSIS — M129 Arthropathy, unspecified: Secondary | ICD-10-CM | POA: Diagnosis not present

## 2012-11-11 DIAGNOSIS — I10 Essential (primary) hypertension: Secondary | ICD-10-CM | POA: Diagnosis not present

## 2012-11-11 DIAGNOSIS — I82619 Acute embolism and thrombosis of superficial veins of unspecified upper extremity: Secondary | ICD-10-CM | POA: Insufficient documentation

## 2012-11-11 DIAGNOSIS — G2581 Restless legs syndrome: Secondary | ICD-10-CM | POA: Diagnosis not present

## 2012-11-11 DIAGNOSIS — Y832 Surgical operation with anastomosis, bypass or graft as the cause of abnormal reaction of the patient, or of later complication, without mention of misadventure at the time of the procedure: Secondary | ICD-10-CM | POA: Insufficient documentation

## 2012-11-11 DIAGNOSIS — E785 Hyperlipidemia, unspecified: Secondary | ICD-10-CM | POA: Insufficient documentation

## 2012-11-11 DIAGNOSIS — Z880 Allergy status to penicillin: Secondary | ICD-10-CM | POA: Diagnosis not present

## 2012-11-11 DIAGNOSIS — T82898A Other specified complication of vascular prosthetic devices, implants and grafts, initial encounter: Secondary | ICD-10-CM | POA: Diagnosis not present

## 2012-11-11 DIAGNOSIS — F411 Generalized anxiety disorder: Secondary | ICD-10-CM | POA: Insufficient documentation

## 2012-11-11 DIAGNOSIS — N186 End stage renal disease: Secondary | ICD-10-CM | POA: Diagnosis not present

## 2012-11-11 DIAGNOSIS — K219 Gastro-esophageal reflux disease without esophagitis: Secondary | ICD-10-CM | POA: Diagnosis not present

## 2012-11-11 HISTORY — DX: Constipation, unspecified: K59.00

## 2012-11-11 HISTORY — PX: AV FISTULA PLACEMENT: SHX1204

## 2012-11-11 LAB — SURGICAL PCR SCREEN
MRSA, PCR: NEGATIVE
Staphylococcus aureus: NEGATIVE

## 2012-11-11 LAB — POCT I-STAT 4, (NA,K, GLUC, HGB,HCT)
Glucose, Bld: 101 mg/dL — ABNORMAL HIGH (ref 70–99)
HCT: 35 % — ABNORMAL LOW (ref 36.0–46.0)

## 2012-11-11 SURGERY — INSERTION OF ARTERIOVENOUS (AV) GORE-TEX GRAFT ARM
Anesthesia: Monitor Anesthesia Care | Site: Arm Lower | Laterality: Left | Wound class: Clean

## 2012-11-11 MED ORDER — 0.9 % SODIUM CHLORIDE (POUR BTL) OPTIME
TOPICAL | Status: DC | PRN
  Administered 2012-11-11: 1000 mL

## 2012-11-11 MED ORDER — FENTANYL CITRATE 0.05 MG/ML IJ SOLN
INTRAMUSCULAR | Status: DC | PRN
  Administered 2012-11-11 (×2): 50 ug via INTRAVENOUS

## 2012-11-11 MED ORDER — LIDOCAINE HCL (PF) 1 % IJ SOLN
INTRAMUSCULAR | Status: DC | PRN
  Administered 2012-11-11: 1 mL

## 2012-11-11 MED ORDER — SODIUM CHLORIDE 0.9 % IR SOLN
Status: DC | PRN
  Administered 2012-11-11: 12:00:00

## 2012-11-11 MED ORDER — ONDANSETRON HCL 4 MG/2ML IJ SOLN
INTRAMUSCULAR | Status: DC | PRN
  Administered 2012-11-11: 4 mg via INTRAVENOUS

## 2012-11-11 MED ORDER — SODIUM CHLORIDE 0.9 % IV SOLN
INTRAVENOUS | Status: DC | PRN
  Administered 2012-11-11: 12:00:00 via INTRAVENOUS

## 2012-11-11 MED ORDER — OXYCODONE-ACETAMINOPHEN 5-325 MG PO TABS: 1.0000 | ORAL_TABLET | ORAL | Status: DC | PRN

## 2012-11-11 MED ORDER — MIDAZOLAM HCL 5 MG/5ML IJ SOLN
INTRAMUSCULAR | Status: DC | PRN
  Administered 2012-11-11: 2 mg via INTRAVENOUS

## 2012-11-11 MED ORDER — HEPARIN SODIUM (PORCINE) 1000 UNIT/ML IJ SOLN
INTRAMUSCULAR | Status: DC | PRN
  Administered 2012-11-11: 6000 [IU] via INTRAVENOUS

## 2012-11-11 MED ORDER — PROTAMINE SULFATE 10 MG/ML IV SOLN
INTRAVENOUS | Status: DC | PRN
  Administered 2012-11-11: 30 mg via INTRAVENOUS

## 2012-11-11 MED ORDER — FENTANYL CITRATE 0.05 MG/ML IJ SOLN
25.0000 ug | INTRAMUSCULAR | Status: DC | PRN
  Administered 2012-11-11: 25 ug via INTRAVENOUS

## 2012-11-11 MED ORDER — LIDOCAINE HCL (PF) 1 % IJ SOLN
INTRAMUSCULAR | Status: AC
  Filled 2012-11-11: qty 30

## 2012-11-11 MED ORDER — LIDOCAINE-EPINEPHRINE (PF) 1 %-1:200000 IJ SOLN
INTRAMUSCULAR | Status: AC
  Filled 2012-11-11: qty 10

## 2012-11-11 MED ORDER — PROPOFOL 10 MG/ML IV BOLUS
INTRAVENOUS | Status: DC | PRN
  Administered 2012-11-11: 30 mg via INTRAVENOUS
  Administered 2012-11-11: 20 mg via INTRAVENOUS

## 2012-11-11 MED ORDER — LACTATED RINGERS IV SOLN: INTRAVENOUS | Status: DC

## 2012-11-11 MED ORDER — LIDOCAINE-EPINEPHRINE (PF) 1 %-1:200000 IJ SOLN
INTRAMUSCULAR | Status: DC | PRN
  Administered 2012-11-11: 30 mL

## 2012-11-11 MED ORDER — PAPAVERINE HCL 30 MG/ML IJ SOLN
INTRAMUSCULAR | Status: AC
  Filled 2012-11-11: qty 2

## 2012-11-11 MED ORDER — FENTANYL CITRATE 0.05 MG/ML IJ SOLN
INTRAMUSCULAR | Status: AC
  Filled 2012-11-11: qty 2

## 2012-11-11 MED ORDER — MUPIROCIN 2 % EX OINT
TOPICAL_OINTMENT | Freq: Two times a day (BID) | CUTANEOUS | Status: DC
  Administered 2012-11-11: 09:00:00 via NASAL
  Filled 2012-11-11 (×2): qty 22

## 2012-11-11 MED ORDER — THROMBIN 20000 UNITS EX SOLR
CUTANEOUS | Status: AC
  Filled 2012-11-11: qty 20000

## 2012-11-11 MED ORDER — SODIUM CHLORIDE 0.9 % IV SOLN: INTRAVENOUS | Status: DC | PRN

## 2012-11-11 MED ORDER — PROPOFOL INFUSION 10 MG/ML OPTIME
INTRAVENOUS | Status: DC | PRN
  Administered 2012-11-11: 75 ug/kg/min via INTRAVENOUS

## 2012-11-11 SURGICAL SUPPLY — 44 items
ADH SKN CLS APL DERMABOND .7 (GAUZE/BANDAGES/DRESSINGS) ×1
ARMBAND PINK RESTRICT EXTREMIT (MISCELLANEOUS) ×3 IMPLANT
CANISTER SUCTION 2500CC (MISCELLANEOUS) ×2 IMPLANT
CLIP TI MEDIUM 6 (CLIP) ×2 IMPLANT
CLIP TI WIDE RED SMALL 6 (CLIP) ×2 IMPLANT
CLOTH BEACON ORANGE TIMEOUT ST (SAFETY) ×2 IMPLANT
COVER PROBE W GEL 5X96 (DRAPES) ×1 IMPLANT
COVER SURGICAL LIGHT HANDLE (MISCELLANEOUS) ×2 IMPLANT
DECANTER SPIKE VIAL GLASS SM (MISCELLANEOUS) ×2 IMPLANT
DERMABOND ADVANCED (GAUZE/BANDAGES/DRESSINGS) ×1
DERMABOND ADVANCED .7 DNX12 (GAUZE/BANDAGES/DRESSINGS) ×1 IMPLANT
DRAIN PENROSE 1/2X12 LTX STRL (WOUND CARE) IMPLANT
ELECT REM PT RETURN 9FT ADLT (ELECTROSURGICAL) ×2
ELECTRODE REM PT RTRN 9FT ADLT (ELECTROSURGICAL) ×1 IMPLANT
GLOVE BIO SURGEON STRL SZ7.5 (GLOVE) ×2 IMPLANT
GLOVE BIOGEL M 6.5 STRL (GLOVE) ×1 IMPLANT
GLOVE BIOGEL PI IND STRL 6.5 (GLOVE) IMPLANT
GLOVE BIOGEL PI IND STRL 7.0 (GLOVE) ×2 IMPLANT
GLOVE BIOGEL PI IND STRL 8 (GLOVE) ×1 IMPLANT
GLOVE BIOGEL PI INDICATOR 6.5 (GLOVE) ×3
GLOVE BIOGEL PI INDICATOR 7.0 (GLOVE) ×2
GLOVE BIOGEL PI INDICATOR 8 (GLOVE) ×1
GLOVE ECLIPSE 7.0 STRL STRAW (GLOVE) ×2 IMPLANT
GLOVE SS BIOGEL STRL SZ 6.5 (GLOVE) IMPLANT
GLOVE SUPERSENSE BIOGEL SZ 6.5 (GLOVE) ×1
GOWN STRL NON-REIN LRG LVL3 (GOWN DISPOSABLE) ×6 IMPLANT
GRAFT GORETEX STRT 4-7X45 (Vascular Products) ×1 IMPLANT
KIT BASIN OR (CUSTOM PROCEDURE TRAY) ×2 IMPLANT
KIT ROOM TURNOVER OR (KITS) ×2 IMPLANT
NS IRRIG 1000ML POUR BTL (IV SOLUTION) ×2 IMPLANT
PACK CV ACCESS (CUSTOM PROCEDURE TRAY) ×2 IMPLANT
PAD ARMBOARD 7.5X6 YLW CONV (MISCELLANEOUS) ×4 IMPLANT
SPONGE GAUZE 4X4 12PLY (GAUZE/BANDAGES/DRESSINGS) ×2 IMPLANT
SPONGE SURGIFOAM ABS GEL 100 (HEMOSTASIS) IMPLANT
SUT PROLENE 6 0 BV (SUTURE) ×4 IMPLANT
SUT SILK 3 0 (SUTURE) ×2
SUT SILK 3-0 18XBRD TIE 12 (SUTURE) IMPLANT
SUT VIC AB 3-0 SH 27 (SUTURE) ×4
SUT VIC AB 3-0 SH 27X BRD (SUTURE) ×1 IMPLANT
SUT VICRYL 4-0 PS2 18IN ABS (SUTURE) ×3 IMPLANT
TOWEL OR 17X24 6PK STRL BLUE (TOWEL DISPOSABLE) ×2 IMPLANT
TOWEL OR 17X26 10 PK STRL BLUE (TOWEL DISPOSABLE) ×2 IMPLANT
UNDERPAD 30X30 INCONTINENT (UNDERPADS AND DIAPERS) ×2 IMPLANT
WATER STERILE IRR 1000ML POUR (IV SOLUTION) ×2 IMPLANT

## 2012-11-11 NOTE — Op Note (Signed)
NAME: [name]   MRN: [id number] DOB: December 19, 1940    DATE OF OPERATION: 11/11/2012  PREOP DIAGNOSIS: Chronic kidney disease  POSTOP DIAGNOSIS: Same  PROCEDURE: New upper arm loop AV graft (left arm)  SURGEON: Di Kindle. Edilia Bo, MD, FACS  ASSIST: Della Goo PA  ANESTHESIA: local with sedation   EBL: minimal  INDICATIONS: [name] is a 72 y.o. 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 I was asked to place access in the left arm. The forearm cephalic vein was very small and not adequate for a fistula. The upper arm cephalic vein was thrombosed.  FINDINGS: I explored the basilic vein which was very small. In addition the patient had a high bifurcation of the brachial artery with small arteries for inflow at the antecubital level. Given this finding I elected to place an upper arm loop graft.  TECHNIQUE: The patient was taken to the operating room and received sedation. The left upper extremity was prepped and draped in usual sterile fashion. Using the ultrasound scanner I did interrogate the forearm cephalic vein which is very small and had multiple branchings and was not usable. The basilic vein was quite small I elected to explore this. A longitudinal incision was made over the basilic vein after the skin was anesthetized. This was dissected free. Next I dissected out the adjacent artery. This was very small and upon further exploration there were 2 arteries present both of which were small. This patient had a high bifurcation of the brachial artery. I therefore elected to place an upper arm loop graft.  A separate longitudinal incision was made beneath the axilla after the skin was anesthetized. The high brachial vein was dissected free and controlled with vessel loop. Multiple branches were divided between clips and 3-0 silk ties. The vein was 5 mm in diameter. The brachial artery was dissected free right at the level where it bifurcated. There were also several  small branches which were controlled. Using the previously placed incision in the distal upper arm a 47 mm tapered PTFE graft was tunneled in a loop fashion in the upper arm with the arterial aspect of the graft along the radial aspect of the upper arm. The patient was heparinized. The multiple branches of the artery were controlled and then the brachial artery was clamped proximally and distally and a longitudinal arteriotomy was made. A short segment of the 4 mm end of the graft was excised, the graft spatulated and sewn end-to-side to the brachial artery using continuous 6-0 Prolene suture. The graft was pulled to appropriate length for anastomosis to the high brachial vein. The vein was ligated distally and spatulated proximally. Graft was cut to the appropriate length, spatulated, and sewn end to end to the vein using 2 continuous 6-0 Prolene sutures. The fascia was an excellent thrill in the graft and a good radial and ulnar signal with the Doppler. The heparin was partially reversed with protamine the wounds were each closed the deep layer of 3-0 Vicryl the skin closed with 4-0 Vicryl. Dermabond was applied. The patient tolerated the procedure well and was transferred to the recovery room in stable condition. All needle and sponge counts were correct.  Brianna Ferrari, MD, FACS [facility]  DATE OF DICTATION:   11/11/2012

## 2012-11-11 NOTE — Anesthesia Postprocedure Evaluation (Signed)
  Anesthesia Post-op Note  Patient: [name]  Procedure(s) Performed: Procedure(s) (LRB): INSERTION OF ARTERIOVENOUS (AV) GORE-TEX GRAFT ARM (Left)  Patient Location: PACU  Anesthesia Type: MAC  Level of Consciousness: awake and alert   Airway and Oxygen Therapy: Patient Spontanous Breathing  Post-op Pain: mild  Post-op Assessment: Post-op Vital signs reviewed, Patient's Cardiovascular Status Stable, Respiratory Function Stable, Patent Airway and No signs of Nausea or vomiting  Last Vitals:  Filed Vitals:   11/11/12 0856  BP: 145/84  Pulse: 70  Temp: 36.8 C  Resp: 16    Post-op Vital Signs: stable   Complications: No apparent anesthesia complications

## 2012-11-11 NOTE — Preoperative (Signed)
Beta Blockers   Reason not to administer Beta Blockers:Not Applicable 

## 2012-11-11 NOTE — Anesthesia Preprocedure Evaluation (Signed)
Anesthesia Evaluation  Patient identified by MRN, date of birth, ID band Patient awake    Reviewed: Allergy & Precautions, H&P , NPO status , Patient's Chart, lab work & pertinent test results, reviewed documented beta blocker date and time   Airway Mallampati: II TM Distance: >3 FB Neck ROM: full    Dental no notable dental hx.    Pulmonary Current Smoker,  breath sounds clear to auscultation  Pulmonary exam normal       Cardiovascular hypertension, Pt. on home beta blockers Rhythm:regular Rate:Normal  Normal stress test 7/11   Neuro/Psych Anxiety Depression negative neurological ROS  negative psych ROS   GI/Hepatic negative GI ROS, Neg liver ROS, GERD-  Medicated and Controlled,dysphagia   Endo/Other  negative endocrine ROS  Renal/GU CRFRenal disease  negative genitourinary   Musculoskeletal   Abdominal   Peds  Hematology negative hematology ROS (+)   Anesthesia Other Findings   Reproductive/Obstetrics negative OB ROS                           Anesthesia Physical Anesthesia Plan  ASA: III  Anesthesia Plan: MAC   Post-op Pain Management:    Induction:   Airway Management Planned:   Additional Equipment:   Intra-op Plan:   Post-operative Plan:   Informed Consent: I have reviewed the patients History and Physical, chart, labs and discussed the procedure including the risks, benefits and alternatives for the proposed anesthesia with the patient or authorized representative who has indicated his/her understanding and acceptance.   Dental Advisory Given  Plan Discussed with: CRNA and Surgeon  Anesthesia Plan Comments:         Anesthesia Quick Evaluation

## 2012-11-11 NOTE — H&P (View-Only) (Signed)
[facility] [place]  Patient name: [name] MRN: [id number] DOB: 01/18/1941 Sex: female  REASON FOR CONSULT: evaluate for new access.  HPI: [name] is a 71 y.o. female dialyzes on Monday Wednesdays and Fridays. She had a right brachiocephalic fistula placed on 06/12/2010. She required revision of her right upper arm fistula on 05/11/2012. A segment of 6 mm PTFE graft was extended up into the axilla. Her graft subsequently clotted most recently on 10/21/2012 and interventional radiology was not able to salvage the graft. They placed a left IJ tunneled dialysis catheter and she was sent to be evaluated for new access. She states that she has had a low-grade fever.  Past Medical History  Diagnosis Date  . Hyperlipidemia   . Hypertension   . Normal cardiac stress test 12/24/2009    lexiscan, imaging normal  . ANEMIA NEC 03/31/2007    Qualifier: Diagnosis of  By: GRANDIS MD, [name]    . Diverticulitis   . IBS (irritable bowel syndrome)   . Thyroid disease   . Depression   . Chronic kidney disease     Hemo MWF  . GERD (gastroesophageal reflux disease)   . H/O hiatal hernia   . Arthritis   . RLS (restless legs syndrome)   . Seizures   . Tubular adenoma of colon 01/2008   Family History  Problem Relation Age of Onset  . Thyroid cancer Mother   . Heart disease Father   . Hypertension Father   . Breast cancer Sister    SOCIAL HISTORY: History  Substance Use Topics  . Smoking status: Current Every Day Smoker -- 0.50 packs/day for 60 years    Types: Cigarettes  . Smokeless tobacco: Never Used  . Alcohol Use: No   Allergies  Allergen Reactions  . Codeine Other (See Comments)    unknown  . Pregabalin Nausea And Vomiting    confusion  . Tuberculin Tests Hives    "blisters"  . Penicillins Rash    No problems breathing. Has tolerated omnicef in past without issue  . Sulfamethoxazole Rash   Current Outpatient Prescriptions  Medication Sig Dispense  Refill  . acetaminophen (TYLENOL) 500 MG tablet Take 500 mg by mouth every 6 (six) hours as needed. For pain      . allopurinol (ZYLOPRIM) 100 MG tablet Only take on days following dialysis (Tues, Thurs, Sat).  30 tablet  6  . amLODipine (NORVASC) 10 MG tablet Take 1 tablet (10 mg total) by mouth daily.  90 tablet  3  . ARIPiprazole (ABILIFY) 2 MG tablet Take 1 mg by mouth every morning.      . aspirin EC 81 MG tablet Take 81 mg by mouth every morning.      . atorvastatin (LIPITOR) 40 MG tablet Take 1 tablet (40 mg total) by mouth every morning.  30 tablet  6  . calcium acetate (PHOSLO) 667 MG capsule Take 667 mg by mouth 3 (three) times daily with meals.       . Calcium Carbonate Antacid (TUMS PO) Take 1 tablet by mouth daily as needed. For stomach upset      . cloNIDine (CATAPRES) 0.1 MG tablet Take 1 tablet (0.1 mg total) by mouth 3 (three) times daily.  90 tablet  3  . cyclobenzaprine (FLEXERIL) 5 MG tablet Take 1 tablet (5 mg total) by mouth 3 (three) times daily as needed for muscle spasms.  30 tablet  1  . fluconazole (DIFLUCAN) 150 MG   tablet Take 1 tablet (150 mg total) by mouth every 3 (three) days.  3 tablet  0  . fluticasone (FLONASE) 50 MCG/ACT nasal spray Place 2 sprays into the nose daily.  16 g  6  . folic acid-vitamin b complex-vitamin c-selenium-zinc (DIALYVITE) 3 MG TABS Take 1 tablet by mouth daily.      . hydrocortisone cream 1 % Apply topically daily as needed.  120 g  0  . lidocaine-prilocaine (EMLA) cream Apply 1 application topically 3 (three) times a week. Use before dialysis on MWF      . loratadine (CLARITIN) 10 MG tablet Take 1 tablet (10 mg total) by mouth daily.  30 tablet  6  . metoprolol succinate (TOPROL-XL) 100 MG 24 hr tablet Take 100 mg by mouth every evening. Take with or immediately following a meal.      . metroNIDAZOLE (METROGEL) 0.75 % vaginal gel Place 1 Applicatorful vaginally 2 (two) times daily. For 5 days.  70 g  0  . mirtazapine (REMERON) 7.5 MG  tablet Take 7.5 mg by mouth at bedtime.       . omeprazole (PRILOSEC) 20 MG capsule Take 2 capsules (40 mg total) by mouth 2 (two) times daily.  60 capsule  6  . polyethylene glycol powder (GLYCOLAX/MIRALAX) powder Take 17 g by mouth daily as needed. For constipation      . senna (SENOKOT) 8.6 MG tablet Take 2 tablets by mouth daily as needed. For constipation       No current facility-administered medications for this visit.   REVIEW OF SYSTEMS: [X ] denotes positive finding; [  ] denotes negative finding  CARDIOVASCULAR:  [ ] chest pain   [ ] chest pressure   [ ] palpitations   [ ] orthopnea   [ ] dyspnea on exertion   [ ] claudication   [ ] rest pain   [ ] DVT   [ ] phlebitis PULMONARY:   [ ] productive cough   [ ] asthma   [ ] wheezing NEUROLOGIC:   [ ] weakness  [ ] paresthesias  [ ] aphasia  [ ] amaurosis  [ ] dizziness HEMATOLOGIC:   [ ] bleeding problems   [ ] clotting disorders MUSCULOSKELETAL:  [ ] joint pain   [ ] joint swelling [ ] leg swelling GASTROINTESTINAL: [ ]  blood in stool  [ ]  hematemesis GENITOURINARY:  [ ]  dysuria  [ ]  hematuria PSYCHIATRIC:  [ ] history of major depression INTEGUMENTARY:  [ ] rashes  [ ] ulcers CONSTITUTIONAL:  [X ] fever   [ ] chills  PHYSICAL EXAM: Filed Vitals:   10/27/12 1428  BP: 143/60  Pulse: 91  Temp: 99 F (37.2 C)  TempSrc: Oral  Height: 5' 1" (1.549 m)  Weight: 162 lb 9.6 oz (73.755 kg)  SpO2: 100%   Body mass index is 30.74 kg/(m^2). GENERAL: The patient is a well-nourished female, in no acute distress. The vital signs are documented above. CARDIOVASCULAR: There is a regular rate and rhythm. He has palpable radial pulses bilaterally. PULMONARY: There is good air exchange bilaterally without wheezing or rales. ABDOMEN: Soft and non-tender with normal pitched bowel sounds.  MUSCULOSKELETAL: There are no major deformities or cyanosis. NEUROLOGIC: No focal weakness or paresthesias are detected. SKIN: There are no ulcers or  rashes noted. PSYCHIATRIC: The patient has a normal affect. Her right upper arm fistula is occluded. She has moderate swelling just above the antecubital area   which is mildly tender.  DATA:  I reviewed her procedure by interventional radiology on 10/21/2012. 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  MEDICAL ISSUES:  I would recommend placement of new access in the left arm. She will return next week for vein mapping on the left. Do not want to proceed with new access in the left until we're sure that she is not having problems with infection given her low-grade fever and swelling in the right arm. This continues to improve then we will plan on placement of a left AV fistula or AV graft in early June.I have explained the indications for placement of an AV fistula or AV graft. I've explained that if at all possible we will place an AV fistula.  I have reviewed the risks of placement of an AV fistula including but not limited to: failure of the fistula to mature, need for subsequent interventions, and thrombosis. In addition I have reviewed the potential complications of placement of an AV graft. These risks include, but are not limited to, graft thrombosis, graft infection, wound healing problems, bleeding, arm swelling, and steal syndrome. All the patient's questions were answered and they are agreeable to proceed with surgery.  Nechuma Boven S [facility] [place] Beeper: [phone]    

## 2012-11-11 NOTE — Interval H&P Note (Signed)
History and Physical Interval Note:  11/11/2012 11:25 AM  [name]  has presented today for surgery, with the diagnosis of End Stage Renal Disease  The various methods of treatment have been discussed with the patient and family. After consideration of risks, benefits and other options for treatment, the patient has consented to  Procedure(s) with comments: ARTERIOVENOUS (AV) FISTULA CREATION (Left) - VS GRAFT INSERTION as a surgical intervention .  The patient's history has been reviewed, patient examined, no change in status, stable for surgery.  I have reviewed the patient's chart and labs.  Questions were answered to the patient's satisfaction.     [name] S

## 2012-11-11 NOTE — Transfer of Care (Signed)
Immediate Anesthesia Transfer of Care Note  Patient: [name]  Procedure(s) Performed: Procedure(s): INSERTION OF ARTERIOVENOUS (AV) GORE-TEX GRAFT ARM (Left)  Patient Location: PACU  Anesthesia Type:MAC  Level of Consciousness: awake, alert , oriented and patient cooperative  Airway & Oxygen Therapy: Patient Spontanous Breathing and Patient connected to nasal cannula oxygen  Post-op Assessment: Report given to PACU RN, Post -op Vital signs reviewed and stable and Patient moving all extremities X 4  Post vital signs: Reviewed and stable  Complications: No apparent anesthesia complications

## 2012-11-11 NOTE — Progress Notes (Signed)
Left chest HD cath flushed with 10 ml NS and 2.34ml 1000 unit/68ml heparin per protocol.

## 2012-11-16 ENCOUNTER — Encounter (HOSPITAL_COMMUNITY): Payer: Self-pay | Admitting: Vascular Surgery

## 2012-11-16 ENCOUNTER — Telehealth: Payer: Self-pay

## 2012-11-16 DIAGNOSIS — T82898A Other specified complication of vascular prosthetic devices, implants and grafts, initial encounter: Secondary | ICD-10-CM

## 2012-11-16 DIAGNOSIS — R202 Paresthesia of skin: Secondary | ICD-10-CM

## 2012-11-16 NOTE — Telephone Encounter (Signed)
Rec'd phone call from the secretary @ [facility].  Reports that Dr. Arrie Aran wants pt. 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  Reports pt. Has had facial swelling and slurred speech since her surgery 6/12.  States that Dr. Arrie Aran concerned that her upper arm AVG is too close to her catheter.  Called pt. To triage her symptoms.  Stated she has swelling in her face on both sides.  C/o difficulty with forming words, due to thick tongue. States her left upper extremity is swollen from fingers to upper arm; c/o numbness/tingling in her left hand, and that she has intermittent tingling in the forearm, from fingers to elbow; c/o difficulty gripping with left hand, due to the amt. of swelling.  Denies any loss of vision; denies weakness in left leg; states left arm is weak due to her recent surgery and swelling.  Denies any difficulty with walking.  Feels that she has had some imbalance issues due to the Oxycodone; states took last dose on Saturday.  Discussed sx's with Dr. Hart Rochester.  Advised pt. can be seen tomorrow with a duplex of her (L) arm AVG.  Notified pt. of appts. for 6/18, and advised pt. to go to the ER if her symptoms worsen, prior to appt.  Verb. Understanding.

## 2012-11-17 ENCOUNTER — Encounter: Payer: Self-pay | Admitting: Vascular Surgery

## 2012-11-17 ENCOUNTER — Other Ambulatory Visit: Payer: Self-pay

## 2012-11-17 ENCOUNTER — Encounter (INDEPENDENT_AMBULATORY_CARE_PROVIDER_SITE_OTHER): Payer: Medicare Other | Admitting: *Deleted

## 2012-11-17 ENCOUNTER — Ambulatory Visit (INDEPENDENT_AMBULATORY_CARE_PROVIDER_SITE_OTHER): Payer: Medicare Other | Admitting: Vascular Surgery

## 2012-11-17 VITALS — BP 156/58 | HR 89 | Temp 98.8°F | Ht 61.0 in | Wt 167.5 lb

## 2012-11-17 DIAGNOSIS — R202 Paresthesia of skin: Secondary | ICD-10-CM

## 2012-11-17 DIAGNOSIS — T82898A Other specified complication of vascular prosthetic devices, implants and grafts, initial encounter: Secondary | ICD-10-CM

## 2012-11-17 DIAGNOSIS — N186 End stage renal disease: Secondary | ICD-10-CM

## 2012-11-17 MED ORDER — VANCOMYCIN HCL 10 G IV SOLR: 1000.0000 mg | INTRAVENOUS | Status: DC

## 2012-11-17 NOTE — Progress Notes (Signed)
[facility] [place]  Patient name: [name] MRN: [id number] DOB: 09-Jun-1940 Sex: female  REASON FOR VISIT: follow up after placement of left upper arm loop AV graft.  HPI: [name] is a 72 y.o. female who dialyzes on Monday Wednesdays and Fridays. She had a thrombosed right upper arm fistula and underwent attempted thrombolysis by interventional radiology on 10/21/2012. She was noted to have occlusion of the right brachiocephalic vein and therefore was not a candidate for any further access of the right arm. The occlusion could not be crossed. At that time a left IJ catheter was placed.  Arrangements were then made for me to place access in her left arm given the central venous occlusion on the right. Studies confirmed that the forearm cephalic vein on the left was very small and not adequate for a fistula. The upper arm cephalic vein was thrombosed. I explored the basilic vein which was very small. In addition she had a high bifurcation of her brachial artery with very small arteries and poor inflow. For this reason an upper arm loop AV graft was placed on 11/11/2012. Since that procedure, she has developed significant swelling in her left upper arm and is also complained of some tongue swelling and facial swelling. 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   REVIEW OF SYSTEMS: Arly.Keller ] denotes positive finding; [  ] denotes negative finding  CARDIOVASCULAR:  [ ]  chest pain   [ ]  dyspnea on exertion    CONSTITUTIONAL:  [ ]  fever   [ ]  chills  PHYSICAL EXAM: Filed Vitals:   11/17/12 1514  BP: 156/58  Pulse: 89  Temp: 98.8 F (37.1 C)  TempSrc: Oral  Height: 5\' 1"  (1.549 m)  Weight: 167 lb 8 oz (75.978 kg)  SpO2: 100%   Body mass index is 31.67 kg/(m^2). GENERAL: The patient is a well-nourished female, in no acute distress. The vital signs are documented above. CARDIOVASCULAR: There is a regular rate and rhythm  PULMONARY: There is good air  exchange bilaterally without wheezing or rales. Her graft has an excellent bruit and thrill. Has a diminished but palpable left radial pulse with a warm well-perfused hand. She has significant left upper extremity swelling. She has a catheter in the left IJ.  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 She is not a candidate to have her left IJ catheter moved to the right side given the occlusion of the right brachiocephalic vein. Therefore I really see no way to salvage her graft. Given that she's having tongue swelling and some problems swallowing I've recommended removal of her graft. She will continue to use her catheter for dialysis. I'll see her back postoperatively to check on her arm at that time she will require ABIs evaluation for a thigh graft. Her schedule to remove her graft tomorrow 11/18/2012.  Cylus Douville S [facility] [place] Beeper: [phone]

## 2012-11-17 NOTE — Progress Notes (Signed)
Left message on [phone] with pt pre-op instructions for 11/18/12 at 8:30 AM

## 2012-11-18 ENCOUNTER — Other Ambulatory Visit: Payer: Self-pay | Admitting: *Deleted

## 2012-11-18 ENCOUNTER — Ambulatory Visit (HOSPITAL_COMMUNITY)
Admission: AD | Admit: 2012-11-18 | Discharge: 2012-11-18 | Disposition: A | Discharge status: Home / Self Care | Payer: Medicare Other | Source: Ambulatory Visit | Attending: Vascular Surgery | Admitting: Vascular Surgery

## 2012-11-18 ENCOUNTER — Encounter (HOSPITAL_COMMUNITY): Payer: Self-pay | Admitting: *Deleted

## 2012-11-18 ENCOUNTER — Encounter (HOSPITAL_COMMUNITY)
Admission: AD | Disposition: A | Discharge status: Home / Self Care | Payer: Self-pay | Source: Ambulatory Visit | Attending: Vascular Surgery

## 2012-11-18 ENCOUNTER — Encounter (HOSPITAL_COMMUNITY): Payer: Self-pay | Admitting: Anesthesiology

## 2012-11-18 ENCOUNTER — Telehealth: Payer: Self-pay | Admitting: Vascular Surgery

## 2012-11-18 ENCOUNTER — Ambulatory Visit (HOSPITAL_COMMUNITY): Payer: Medicare Other | Admitting: Anesthesiology

## 2012-11-18 DIAGNOSIS — Z79899 Other long term (current) drug therapy: Secondary | ICD-10-CM | POA: Insufficient documentation

## 2012-11-18 DIAGNOSIS — Z885 Allergy status to narcotic agent status: Secondary | ICD-10-CM | POA: Diagnosis not present

## 2012-11-18 DIAGNOSIS — N186 End stage renal disease: Secondary | ICD-10-CM | POA: Diagnosis not present

## 2012-11-18 DIAGNOSIS — K219 Gastro-esophageal reflux disease without esophagitis: Secondary | ICD-10-CM | POA: Diagnosis not present

## 2012-11-18 DIAGNOSIS — G2581 Restless legs syndrome: Secondary | ICD-10-CM | POA: Diagnosis not present

## 2012-11-18 DIAGNOSIS — M7989 Other specified soft tissue disorders: Secondary | ICD-10-CM | POA: Diagnosis not present

## 2012-11-18 DIAGNOSIS — I120 Hypertensive chronic kidney disease with stage 5 chronic kidney disease or end stage renal disease: Secondary | ICD-10-CM | POA: Insufficient documentation

## 2012-11-18 DIAGNOSIS — I82290 Acute embolism and thrombosis of other thoracic veins: Secondary | ICD-10-CM | POA: Diagnosis not present

## 2012-11-18 DIAGNOSIS — T82898A Other specified complication of vascular prosthetic devices, implants and grafts, initial encounter: Secondary | ICD-10-CM | POA: Insufficient documentation

## 2012-11-18 DIAGNOSIS — Z01810 Encounter for preprocedural cardiovascular examination: Secondary | ICD-10-CM

## 2012-11-18 DIAGNOSIS — F17200 Nicotine dependence, unspecified, uncomplicated: Secondary | ICD-10-CM | POA: Diagnosis not present

## 2012-11-18 DIAGNOSIS — R569 Unspecified convulsions: Secondary | ICD-10-CM | POA: Insufficient documentation

## 2012-11-18 DIAGNOSIS — D649 Anemia, unspecified: Secondary | ICD-10-CM | POA: Diagnosis not present

## 2012-11-18 DIAGNOSIS — M129 Arthropathy, unspecified: Secondary | ICD-10-CM | POA: Insufficient documentation

## 2012-11-18 DIAGNOSIS — I871 Compression of vein: Secondary | ICD-10-CM | POA: Insufficient documentation

## 2012-11-18 DIAGNOSIS — N189 Chronic kidney disease, unspecified: Secondary | ICD-10-CM | POA: Diagnosis not present

## 2012-11-18 DIAGNOSIS — K5732 Diverticulitis of large intestine without perforation or abscess without bleeding: Secondary | ICD-10-CM | POA: Diagnosis not present

## 2012-11-18 DIAGNOSIS — Z992 Dependence on renal dialysis: Secondary | ICD-10-CM | POA: Diagnosis not present

## 2012-11-18 DIAGNOSIS — E785 Hyperlipidemia, unspecified: Secondary | ICD-10-CM | POA: Diagnosis not present

## 2012-11-18 DIAGNOSIS — I10 Essential (primary) hypertension: Secondary | ICD-10-CM | POA: Diagnosis not present

## 2012-11-18 DIAGNOSIS — Z880 Allergy status to penicillin: Secondary | ICD-10-CM | POA: Diagnosis not present

## 2012-11-18 DIAGNOSIS — K5730 Diverticulosis of large intestine without perforation or abscess without bleeding: Secondary | ICD-10-CM | POA: Insufficient documentation

## 2012-11-18 DIAGNOSIS — Z882 Allergy status to sulfonamides status: Secondary | ICD-10-CM | POA: Insufficient documentation

## 2012-11-18 DIAGNOSIS — Z7982 Long term (current) use of aspirin: Secondary | ICD-10-CM | POA: Diagnosis not present

## 2012-11-18 DIAGNOSIS — K589 Irritable bowel syndrome without diarrhea: Secondary | ICD-10-CM | POA: Insufficient documentation

## 2012-11-18 DIAGNOSIS — E079 Disorder of thyroid, unspecified: Secondary | ICD-10-CM | POA: Insufficient documentation

## 2012-11-18 DIAGNOSIS — Y832 Surgical operation with anastomosis, bypass or graft as the cause of abnormal reaction of the patient, or of later complication, without mention of misadventure at the time of the procedure: Secondary | ICD-10-CM | POA: Insufficient documentation

## 2012-11-18 DIAGNOSIS — Z888 Allergy status to other drugs, medicaments and biological substances status: Secondary | ICD-10-CM | POA: Diagnosis not present

## 2012-11-18 HISTORY — PX: PATCH ANGIOPLASTY: SHX6230

## 2012-11-18 HISTORY — PX: AVGG REMOVAL: SHX5153

## 2012-11-18 LAB — POCT I-STAT 4, (NA,K, GLUC, HGB,HCT): Sodium: 136 mEq/L (ref 135–145)

## 2012-11-18 SURGERY — REMOVAL OF ARTERIOVENOUS GORETEX GRAFT (AVGG)
Anesthesia: General | Site: Arm Upper | Laterality: Left | Wound class: Clean

## 2012-11-18 MED ORDER — THROMBIN 20000 UNITS EX SOLR
CUTANEOUS | Status: AC
  Filled 2012-11-18: qty 20000

## 2012-11-18 MED ORDER — PROTAMINE SULFATE 10 MG/ML IV SOLN
INTRAVENOUS | Status: DC | PRN
  Administered 2012-11-18 (×4): 10 mg via INTRAVENOUS

## 2012-11-18 MED ORDER — OXYCODONE HCL 5 MG PO TABS: 5.0000 mg | ORAL_TABLET | Freq: Four times a day (QID) | ORAL | Status: DC | PRN

## 2012-11-18 MED ORDER — OXYCODONE HCL 5 MG/5ML PO SOLN: 5.0000 mg | Freq: Once | ORAL | Status: AC | PRN

## 2012-11-18 MED ORDER — SODIUM CHLORIDE 0.9 % IV SOLN
INTRAVENOUS | Status: DC
  Administered 2012-11-18: 12:00:00 via INTRAVENOUS

## 2012-11-18 MED ORDER — LIDOCAINE-EPINEPHRINE (PF) 1 %-1:200000 IJ SOLN
INTRAMUSCULAR | Status: AC
  Filled 2012-11-18: qty 10

## 2012-11-18 MED ORDER — OXYCODONE HCL 5 MG PO TABS
5.0000 mg | ORAL_TABLET | Freq: Once | ORAL | Status: AC | PRN
  Administered 2012-11-18: 5 mg via ORAL

## 2012-11-18 MED ORDER — THROMBIN 20000 UNITS EX KIT
PACK | CUTANEOUS | Status: AC
  Filled 2012-11-18: qty 1

## 2012-11-18 MED ORDER — 0.9 % SODIUM CHLORIDE (POUR BTL) OPTIME
TOPICAL | Status: DC | PRN
  Administered 2012-11-18: 1000 mL

## 2012-11-18 MED ORDER — HEPARIN SODIUM (PORCINE) 1000 UNIT/ML IJ SOLN
INTRAMUSCULAR | Status: DC | PRN
  Administered 2012-11-18: 6000 [IU] via INTRAVENOUS

## 2012-11-18 MED ORDER — HYDROMORPHONE HCL PF 1 MG/ML IJ SOLN
INTRAMUSCULAR | Status: AC
  Filled 2012-11-18: qty 1

## 2012-11-18 MED ORDER — SODIUM CHLORIDE 0.9 % IR SOLN
Status: DC | PRN
  Administered 2012-11-18: 14:00:00

## 2012-11-18 MED ORDER — FENTANYL CITRATE 0.05 MG/ML IJ SOLN
INTRAMUSCULAR | Status: DC | PRN
  Administered 2012-11-18: 25 ug via INTRAVENOUS
  Administered 2012-11-18: 50 ug via INTRAVENOUS
  Administered 2012-11-18: 25 ug via INTRAVENOUS
  Administered 2012-11-18: 50 ug via INTRAVENOUS

## 2012-11-18 MED ORDER — ONDANSETRON HCL 4 MG/2ML IJ SOLN
INTRAMUSCULAR | Status: DC | PRN
  Administered 2012-11-18: 4 mg via INTRAVENOUS

## 2012-11-18 MED ORDER — PROPOFOL 10 MG/ML IV BOLUS
INTRAVENOUS | Status: DC | PRN
  Administered 2012-11-18: 150 mg via INTRAVENOUS

## 2012-11-18 MED ORDER — HYDROMORPHONE HCL PF 1 MG/ML IJ SOLN
0.2500 mg | INTRAMUSCULAR | Status: DC | PRN
  Administered 2012-11-18 (×3): 0.5 mg via INTRAVENOUS

## 2012-11-18 MED ORDER — VANCOMYCIN HCL IN DEXTROSE 1-5 GM/200ML-% IV SOLN
1000.0000 mg | INTRAVENOUS | Status: AC
  Administered 2012-11-18: 1000 mg via INTRAVENOUS
  Filled 2012-11-18: qty 200

## 2012-11-18 MED ORDER — OXYCODONE HCL 5 MG PO TABS
ORAL_TABLET | ORAL | Status: AC
  Filled 2012-11-18: qty 1

## 2012-11-18 MED ORDER — LIDOCAINE HCL (CARDIAC) 20 MG/ML IV SOLN
INTRAVENOUS | Status: DC | PRN
  Administered 2012-11-18: 100 mg via INTRAVENOUS

## 2012-11-18 MED ORDER — MEPERIDINE HCL 25 MG/ML IJ SOLN: 6.2500 mg | INTRAMUSCULAR | Status: DC | PRN

## 2012-11-18 MED ORDER — ONDANSETRON HCL 4 MG/2ML IJ SOLN: 4.0000 mg | Freq: Once | INTRAMUSCULAR | Status: DC | PRN

## 2012-11-18 SURGICAL SUPPLY — 37 items
ADH SKN CLS APL DERMABOND .7 (GAUZE/BANDAGES/DRESSINGS) ×1
CANISTER SUCTION 2500CC (MISCELLANEOUS) ×2 IMPLANT
CLIP TI MEDIUM 6 (CLIP) ×2 IMPLANT
CLIP TI WIDE RED SMALL 6 (CLIP) ×2 IMPLANT
CLOTH BEACON ORANGE TIMEOUT ST (SAFETY) ×2 IMPLANT
COVER SURGICAL LIGHT HANDLE (MISCELLANEOUS) ×2 IMPLANT
DECANTER SPIKE VIAL GLASS SM (MISCELLANEOUS) IMPLANT
DERMABOND ADVANCED (GAUZE/BANDAGES/DRESSINGS) ×1
DERMABOND ADVANCED .7 DNX12 (GAUZE/BANDAGES/DRESSINGS) ×1 IMPLANT
ELECT REM PT RETURN 9FT ADLT (ELECTROSURGICAL) ×2
ELECTRODE REM PT RTRN 9FT ADLT (ELECTROSURGICAL) ×1 IMPLANT
GLOVE BIO SURGEON STRL SZ7.5 (GLOVE) ×2 IMPLANT
GLOVE BIOGEL M 6.5 STRL (GLOVE) ×2 IMPLANT
GLOVE BIOGEL M 7.0 STRL (GLOVE) ×1 IMPLANT
GLOVE BIOGEL PI IND STRL 6.5 (GLOVE) IMPLANT
GLOVE BIOGEL PI IND STRL 7.5 (GLOVE) IMPLANT
GLOVE BIOGEL PI IND STRL 8 (GLOVE) ×1 IMPLANT
GLOVE BIOGEL PI INDICATOR 6.5 (GLOVE) ×1
GLOVE BIOGEL PI INDICATOR 7.5 (GLOVE) ×1
GLOVE BIOGEL PI INDICATOR 8 (GLOVE) ×1
GLOVE ECLIPSE 6.5 STRL STRAW (GLOVE) ×1 IMPLANT
GLOVE SURG SS PI 7.0 STRL IVOR (GLOVE) ×1 IMPLANT
GOWN STRL NON-REIN LRG LVL3 (GOWN DISPOSABLE) ×6 IMPLANT
KIT BASIN OR (CUSTOM PROCEDURE TRAY) ×2 IMPLANT
KIT ROOM TURNOVER OR (KITS) ×2 IMPLANT
NS IRRIG 1000ML POUR BTL (IV SOLUTION) ×2 IMPLANT
PACK CV ACCESS (CUSTOM PROCEDURE TRAY) ×2 IMPLANT
PAD ARMBOARD 7.5X6 YLW CONV (MISCELLANEOUS) ×4 IMPLANT
SPONGE SURGIFOAM ABS GEL 100 (HEMOSTASIS) IMPLANT
SUT PROLENE 6 0 BV (SUTURE) ×2 IMPLANT
SUT VIC AB 3-0 SH 27 (SUTURE) ×2
SUT VIC AB 3-0 SH 27X BRD (SUTURE) ×1 IMPLANT
SUT VICRYL 4-0 PS2 18IN ABS (SUTURE) ×1 IMPLANT
TOWEL OR 17X24 6PK STRL BLUE (TOWEL DISPOSABLE) ×2 IMPLANT
TOWEL OR 17X26 10 PK STRL BLUE (TOWEL DISPOSABLE) ×2 IMPLANT
UNDERPAD 30X30 INCONTINENT (UNDERPADS AND DIAPERS) ×2 IMPLANT
WATER STERILE IRR 1000ML POUR (IV SOLUTION) ×2 IMPLANT

## 2012-11-18 NOTE — H&P (View-Only) (Signed)
[facility] [place]  Patient name: [name] MRN: [id number] DOB: 1940/11/29 Sex: female  REASON FOR CONSULT: evaluate for new access.  HPI: [name] is a 72 y.o. female dialyzes on Monday Wednesdays and Fridays. She had a right brachiocephalic fistula placed on 06/12/2010. She required revision of her right upper arm fistula on 05/11/2012. A segment of 6 mm PTFE graft was extended up into the axilla. Her graft subsequently clotted most recently on 10/21/2012 and interventional radiology was not able to salvage the graft. They placed a left IJ tunneled dialysis catheter and she was sent to be evaluated for new access. She states that she has had a low-grade fever.  Past Medical History  Diagnosis Date  . Hyperlipidemia   . Hypertension   . Normal cardiac stress test 12/24/2009    lexiscan, imaging normal  . 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 Diverticulitis   . IBS (irritable bowel syndrome)   . Thyroid disease   . Depression   . Chronic kidney disease     Hemo MWF  . GERD (gastroesophageal reflux disease)   . H/O hiatal hernia   . Arthritis   . RLS (restless legs syndrome)   . Seizures   . Tubular adenoma of colon 01/2008   Family History  Problem Relation Age of Onset  . Thyroid cancer Mother   . Heart disease Father   . Hypertension Father   . Breast cancer Sister    SOCIAL HISTORY: History  Substance Use Topics  . Smoking status: Current Every Day Smoker -- 0.50 packs/day for 60 years    Types: Cigarettes  . Smokeless tobacco: Never Used  . Alcohol Use: No   Allergies  Allergen Reactions  . Codeine Other (See Comments)    unknown  . Pregabalin Nausea And Vomiting    confusion  . Tuberculin Tests Hives    "blisters"  . Penicillins Rash    No problems breathing. Has tolerated omnicef in past without issue  . Sulfamethoxazole Rash   Current Outpatient Prescriptions  Medication Sig Dispense  Refill  . acetaminophen (TYLENOL) 500 MG tablet Take 500 mg by mouth every 6 (six) hours as needed. For pain      . allopurinol (ZYLOPRIM) 100 MG tablet Only take on days following dialysis (Tues, Thurs, Sat).  30 tablet  6  . amLODipine (NORVASC) 10 MG tablet Take 1 tablet (10 mg total) by mouth daily.  90 tablet  3  . ARIPiprazole (ABILIFY) 2 MG tablet Take 1 mg by mouth every morning.      Marland Kitchen aspirin EC 81 MG tablet Take 81 mg by mouth every morning.      Marland Kitchen atorvastatin (LIPITOR) 40 MG tablet Take 1 tablet (40 mg total) by mouth every morning.  30 tablet  6  . calcium acetate (PHOSLO) 667 MG capsule Take 667 mg by mouth 3 (three) times daily with meals.       . Calcium Carbonate Antacid (TUMS PO) Take 1 tablet by mouth daily as needed. For stomach upset      . cloNIDine (CATAPRES) 0.1 MG tablet Take 1 tablet (0.1 mg total) by mouth 3 (three) times daily.  90 tablet  3  . cyclobenzaprine (FLEXERIL) 5 MG tablet Take 1 tablet (5 mg total) by mouth 3 (three) times daily as needed for muscle spasms.  30 tablet  1  . fluconazole (DIFLUCAN) 150 MG  tablet Take 1 tablet (150 mg total) by mouth every 3 (three) days.  3 tablet  0  . fluticasone (FLONASE) 50 MCG/ACT nasal spray Place 2 sprays into the nose daily.  16 g  6  . folic acid-vitamin b complex-vitamin c-selenium-zinc (DIALYVITE) 3 MG TABS Take 1 tablet by mouth daily.      . hydrocortisone cream 1 % Apply topically daily as needed.  120 g  0  . lidocaine-prilocaine (EMLA) cream Apply 1 application topically 3 (three) times a week. Use before dialysis on MWF      . loratadine (CLARITIN) 10 MG tablet Take 1 tablet (10 mg total) by mouth daily.  30 tablet  6  . metoprolol succinate (TOPROL-XL) 100 MG 24 hr tablet Take 100 mg by mouth every evening. Take with or immediately following a meal.      . metroNIDAZOLE (METROGEL) 0.75 % vaginal gel Place 1 Applicatorful vaginally 2 (two) times daily. For 5 days.  70 g  0  . mirtazapine (REMERON) 7.5 MG  tablet Take 7.5 mg by mouth at bedtime.       Marland Kitchen omeprazole (PRILOSEC) 20 MG capsule Take 2 capsules (40 mg total) by mouth 2 (two) times daily.  60 capsule  6  . polyethylene glycol powder (GLYCOLAX/MIRALAX) powder Take 17 g by mouth daily as needed. For constipation      . senna (SENOKOT) 8.6 MG tablet Take 2 tablets by mouth daily as needed. For constipation       No current facility-administered medications for this visit.   REVIEW OF SYSTEMS: Arly.Keller ] denotes positive finding; [  ] denotes negative finding  CARDIOVASCULAR:  [ ]  chest pain   [ ]  chest pressure   [ ]  palpitations   [ ]  orthopnea   [ ]  dyspnea on exertion   [ ]  claudication   [ ]  rest pain   [ ]  DVT   [ ]  phlebitis PULMONARY:   [ ]  productive cough   [ ]  asthma   [ ]  wheezing NEUROLOGIC:   [ ]  weakness  [ ]  paresthesias  [ ]  aphasia  [ ]  amaurosis  [ ]  dizziness HEMATOLOGIC:   [ ]  bleeding problems   [ ]  clotting disorders MUSCULOSKELETAL:  [ ]  joint pain   [ ]  joint swelling [ ]  leg swelling GASTROINTESTINAL: [ ]   blood in stool  [ ]   hematemesis GENITOURINARY:  [ ]   dysuria  [ ]   hematuria PSYCHIATRIC:  [ ]  history of major depression INTEGUMENTARY:  [ ]  rashes  [ ]  ulcers CONSTITUTIONAL:  Arly.Keller ] fever   [ ]  chills  PHYSICAL EXAM: Filed Vitals:   10/27/12 1428  BP: 143/60  Pulse: 91  Temp: 99 F (37.2 C)  TempSrc: Oral  Height: 5\' 1"  (1.549 m)  Weight: 162 lb 9.6 oz (73.755 kg)  SpO2: 100%   Body mass index is 30.74 kg/(m^2). GENERAL: The patient is a well-nourished female, in no acute distress. The vital signs are documented above. CARDIOVASCULAR: There is a regular rate and rhythm. He has palpable radial pulses bilaterally. PULMONARY: There is good air exchange bilaterally without wheezing or rales. ABDOMEN: Soft and non-tender with normal pitched bowel sounds.  MUSCULOSKELETAL: There are no major deformities or cyanosis. NEUROLOGIC: No focal weakness or paresthesias are detected. SKIN: There are no ulcers or  rashes noted. PSYCHIATRIC: The patient has a normal affect. Her right upper arm fistula is occluded. She has moderate swelling just above the antecubital area  which is mildly tender.  DATA:  I reviewed her procedure by interventional radiology on 10/21/2012. 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  MEDICAL ISSUES:  I would recommend placement of new access in the left arm. She will return next week for vein mapping on the left. Do not want to proceed with new access in the left until we're sure that she is not having problems with infection given her low-grade fever and swelling in the right arm. This continues to improve then we will plan on placement of a left AV fistula or AV graft in early June.I have explained the indications for placement of an AV fistula or AV graft. I've explained that if at all possible we will place an AV fistula.  I have reviewed the risks of placement of an AV fistula including but not limited to: failure of the fistula to mature, need for subsequent interventions, and thrombosis. In addition I have reviewed the potential complications of placement of an AV graft. These risks include, but are not limited to, graft thrombosis, graft infection, wound healing problems, bleeding, arm swelling, and steal syndrome. All the patient's questions were answered and they are agreeable to proceed with surgery.  Brynn Mulgrew S [facility] [place] Beeper: [phone]

## 2012-11-18 NOTE — Telephone Encounter (Addendum)
Message copied by Rosalyn Charters on Thu Nov 18, 2012  3:40 PM ------      Message from: Lorin Mercy K      Created: Thu Nov 18, 2012  3:20 PM      Regarding: schedule                   ----- Message -----         From: Dara Lords, PA-C         Sent: 11/18/2012   2:55 PM           To: Sharee Pimple, CMA            S/p removal of LUA AVG 11/18/12.  F/u with Dr. Edilia Bo in 2 weeks.  Will need ABI's at that visit to evaluate for thigh graft.            Thanks,      Lelon Mast ------  notified patient of fu appt. with dr. Edilia Bo on 12-01-12 11am

## 2012-11-18 NOTE — Anesthesia Postprocedure Evaluation (Signed)
Anesthesia Post Note  Patient: [name]  Procedure(s) Performed: Procedure(s) (LRB): REMOVAL OF LEFT UPPER ARM ARTERIOVENOUS GORETEX GRAFT (AVGG) (Left) PATCH ANGIOPLASTY (Left)  Anesthesia type: general  Patient location: PACU  Post pain: Pain level controlled  Post assessment: Patient's Cardiovascular Status Stable  Last Vitals:  Filed Vitals:   11/18/12 1604  BP: 167/76  Pulse: 76  Temp: 36.4 C  Resp: 17    Post vital signs: Reviewed and stable  Level of consciousness: sedated  Complications: No apparent anesthesia complications

## 2012-11-18 NOTE — Interval H&P Note (Signed)
History and Physical Interval Note:  11/18/2012 1:38 PM  [name]  has presented today for surgery, with the diagnosis of Superior Vena Cava Syndrome  The various methods of treatment have been discussed with the patient and family. After consideration of risks, benefits and other options for treatment, the patient has consented to  Procedure(s): REMOVAL OF LEFT UPPER ARM ARTERIOVENOUS GORETEX GRAFT (AVGG) (Left) as a surgical intervention .  The patient's history has been reviewed, patient examined, no change in status, stable for surgery.  I have reviewed the patient's chart and labs.  Questions were answered to the patient's satisfaction.     Rob Mciver S

## 2012-11-18 NOTE — Anesthesia Procedure Notes (Signed)
Procedure Name: LMA Insertion Date/Time: 11/18/2012 1:50 PM Performed by: Carmela Rima Pre-anesthesia Checklist: Patient identified, Emergency Drugs available, Suction available, Patient being monitored and Timeout performed Patient Re-evaluated:Patient Re-evaluated prior to inductionOxygen Delivery Method: Circle system utilized Preoxygenation: Pre-oxygenation with 100% oxygen Intubation Type: IV induction Ventilation: Mask ventilation without difficulty LMA: LMA inserted LMA Size: 4.0 Number of attempts: 1 Placement Confirmation: positive ETCO2 Tube secured with: Tape Dental Injury: Teeth and Oropharynx as per pre-operative assessment

## 2012-11-18 NOTE — Transfer of Care (Signed)
Immediate Anesthesia Transfer of Care Note  Patient: [name]  Procedure(s) Performed: Procedure(s): REMOVAL OF LEFT UPPER ARM ARTERIOVENOUS GORETEX GRAFT (AVGG) (Left) PATCH ANGIOPLASTY (Left)  Patient Location: PACU  Anesthesia Type:General  Level of Consciousness: awake, alert  and oriented  Airway & Oxygen Therapy: Patient Spontanous Breathing and Patient connected to nasal cannula oxygen  Post-op Assessment: Report given to PACU RN, Post -op Vital signs reviewed and stable and Patient moving all extremities X 4  Post vital signs: Reviewed and stable  Complications: No apparent anesthesia complications

## 2012-11-18 NOTE — Preoperative (Signed)
Beta Blockers   Reason not to administer Beta Blockers:Not Applicable 

## 2012-11-18 NOTE — Progress Notes (Signed)
IV Team Note:  11-18-12   Called to stop iv fluids to blue port of hemo cath;  Blue port flushed with 10cc ns, followed by 1.2cc heparin, 1000 units/ml; both ports capped and clamped then taped for security; pt for dialysis tomorrow;  Daughter with pt;   Barkley Bruns RN IV Team

## 2012-11-18 NOTE — Anesthesia Preprocedure Evaluation (Addendum)
Anesthesia Evaluation  Patient identified by MRN, date of birth, ID band Patient awake    Reviewed: Allergy & Precautions, H&P , NPO status , Patient's Chart, lab work & pertinent test results  Airway Mallampati: I TM Distance: >3 FB Neck ROM: Full    Dental  (+) Poor Dentition and Dental Advidsory Given   Pulmonary          Cardiovascular hypertension, Pt. on medications     Neuro/Psych Seizures -,  PSYCHIATRIC DISORDERS Anxiety Depression  Neuromuscular disease    GI/Hepatic hiatal hernia, GERD-  Controlled,  Endo/Other    Renal/GU Dialysis and ESRFRenal disease     Musculoskeletal   Abdominal   Peds  Hematology  (+) anemia ,   Anesthesia Other Findings   Reproductive/Obstetrics                          Anesthesia Physical Anesthesia Plan  ASA: III  Anesthesia Plan: General   Post-op Pain Management:    Induction: Intravenous  Airway Management Planned: Oral ETT  Additional Equipment:   Intra-op Plan:   Post-operative Plan: Extubation in OR  Informed Consent: I have reviewed the patients History and Physical, chart, labs and discussed the procedure including the risks, benefits and alternatives for the proposed anesthesia with the patient or authorized representative who has indicated his/her understanding and acceptance.   Dental Advisory Given  Plan Discussed with: CRNA, Surgeon and Anesthesiologist  Anesthesia Plan Comments:        Anesthesia Quick Evaluation

## 2012-11-18 NOTE — Op Note (Signed)
NAME: [name]   MRN: [id number] DOB: 1940/07/09    DATE OF OPERATION: 11/18/2012  PREOP DIAGNOSIS: venous hypertension left upper extremity  POSTOP DIAGNOSIS: same  PROCEDURE:  1. Removal of left upper arm AV graft 2. Vein patch angioplasty of left brachial artery  SURGEON: Di Kindle. Edilia Bo, MD, FACS  ASSIST: Doreatha Massed, PA  ANESTHESIA: Gen.   EBL: minimal  INDICATIONS: [name] is a 72 y.o. female who had a left upper arm graft placed a week ago. She has significant arm swelling and also evidence of SVC syndrome. She has a known right brachiocephalic vein occlusion. Given that the only option would be to move her left IJ catheter to the groin was still in no guarantee that this would relieve her symptoms I recommended removal of her graft.  FINDINGS: Patent graft.  TECHNIQUE: The patient was brought to the operating room and received a general anesthetic. Left upper extremity was prepped and draped in usual sterile fashion. The previous incision in the axilla was opened and the arterial and venous limbs of the graft were ligated with 2-0 silk ties. The venous anastomosis was end-to-end. I ligated the axillary vein higher up and then the venous anastomosis was excised. The vein was removed from the graft to be used as a vein patch. Next the patient was heparinized. The brachial artery was clamped proximally and distally and the arterial anastomosis was taken down. The artery was then patched with the vein which had been harvested with running 6-0 Prolene suture. At the completion was excellent brachial pulse and radial pulse. The heparin was partially reversed with protamine. The wounds closed the deep there thrill Vicryl, subcutaneous layer 3-0 Vicryl, and the skin closed with a 4-0 subcuticular stitch. Dermabond was applied. The patient tolerated the procedure well and was transferred to the recovery room in stable condition. All needle and sponge counts were  correct.  Waverly Ferrari, MD, FACS [facility]  DATE OF DICTATION:   11/18/2012

## 2012-11-19 ENCOUNTER — Encounter (HOSPITAL_COMMUNITY): Payer: Self-pay | Admitting: Vascular Surgery

## 2012-11-19 ENCOUNTER — Other Ambulatory Visit: Payer: Self-pay

## 2012-11-20 ENCOUNTER — Other Ambulatory Visit: Payer: Self-pay | Admitting: Family Medicine

## 2012-11-29 DIAGNOSIS — N186 End stage renal disease: Secondary | ICD-10-CM | POA: Diagnosis not present

## 2012-11-30 ENCOUNTER — Encounter: Payer: Self-pay | Admitting: Vascular Surgery

## 2012-12-01 ENCOUNTER — Encounter: Payer: Self-pay | Admitting: Vascular Surgery

## 2012-12-01 ENCOUNTER — Ambulatory Visit (INDEPENDENT_AMBULATORY_CARE_PROVIDER_SITE_OTHER): Payer: Medicare Other | Admitting: Vascular Surgery

## 2012-12-01 VITALS — BP 148/68 | HR 88 | Temp 97.4°F | Resp 16 | Ht 61.0 in | Wt 167.0 lb

## 2012-12-01 DIAGNOSIS — Z48812 Encounter for surgical aftercare following surgery on the circulatory system: Secondary | ICD-10-CM | POA: Insufficient documentation

## 2012-12-01 DIAGNOSIS — D631 Anemia in chronic kidney disease: Secondary | ICD-10-CM | POA: Diagnosis not present

## 2012-12-01 DIAGNOSIS — N2581 Secondary hyperparathyroidism of renal origin: Secondary | ICD-10-CM | POA: Diagnosis not present

## 2012-12-01 DIAGNOSIS — D509 Iron deficiency anemia, unspecified: Secondary | ICD-10-CM | POA: Diagnosis not present

## 2012-12-01 DIAGNOSIS — N186 End stage renal disease: Secondary | ICD-10-CM

## 2012-12-01 NOTE — Progress Notes (Signed)
[facility] [place]  Patient name: [name] MRN: [id number] DOB: 12-31-1940 Sex: female  REASON FOR VISIT: follow up after removal of left upper arm AV graft.  HPI: [name] is a 72 y.o. female who had a left upper arm AV graft placed and subsequently developed significant arm swelling and evidence of SVC syndrome. She has a known right brachiocephalic vein occlusion. Given that she likely has central venous stenosis or occlusion on the left also the graft was removed with vein patch angioplasty of the brachial artery. She came in today to discuss placing a thigh graft. He states that she would like a second opinion concerning this and therefore did not want is to proceed with ABIs to obtain preoperatively.  REVIEW OF SYSTEMS: Arly.Keller ] denotes positive finding; [  ] denotes negative finding  CARDIOVASCULAR:  [ ]  chest pain   [ ]  dyspnea on exertion    CONSTITUTIONAL:  [ ]  fever   [ ]  chills  PHYSICAL EXAM: Filed Vitals:   12/01/12 1121  BP: 148/68  Pulse: 88  Temp: 97.4 F (36.3 C)  TempSrc: Oral  Resp: 16  Height: 5\' 1"  (1.549 m)  Weight: 167 lb (75.751 kg)  SpO2: 100%   Body mass index is 31.57 kg/(m^2). GENERAL: The patient is a well-nourished female, in no acute distress. The vital signs are documented above. CARDIOVASCULAR: There is a regular rate and rhythm  PULMONARY: There is good air exchange bilaterally without wheezing or rales. She does have palpable dorsalis pedis pulses bilaterally.  MEDICAL ISSUES: If she elects to proceed with a thigh graft we would be happy to discuss this with her further. Are currently she is not interested and would like to seek a second opinion. She does not appear to be a candidate for any further upper extremity access.  DICKSON,CHRISTOPHER S [facility] [place] Beeper: [phone]

## 2012-12-14 DIAGNOSIS — E785 Hyperlipidemia, unspecified: Secondary | ICD-10-CM | POA: Diagnosis not present

## 2012-12-14 DIAGNOSIS — T82898A Other specified complication of vascular prosthetic devices, implants and grafts, initial encounter: Secondary | ICD-10-CM | POA: Diagnosis not present

## 2012-12-14 DIAGNOSIS — I10 Essential (primary) hypertension: Secondary | ICD-10-CM | POA: Diagnosis not present

## 2012-12-14 DIAGNOSIS — N186 End stage renal disease: Secondary | ICD-10-CM | POA: Diagnosis not present

## 2012-12-24 ENCOUNTER — Encounter: Payer: Self-pay | Admitting: Gastroenterology

## 2012-12-30 DIAGNOSIS — N186 End stage renal disease: Secondary | ICD-10-CM | POA: Diagnosis not present

## 2012-12-31 DIAGNOSIS — N186 End stage renal disease: Secondary | ICD-10-CM | POA: Diagnosis not present

## 2012-12-31 DIAGNOSIS — E213 Hyperparathyroidism, unspecified: Secondary | ICD-10-CM | POA: Diagnosis not present

## 2012-12-31 DIAGNOSIS — N2581 Secondary hyperparathyroidism of renal origin: Secondary | ICD-10-CM | POA: Diagnosis not present

## 2012-12-31 DIAGNOSIS — D509 Iron deficiency anemia, unspecified: Secondary | ICD-10-CM | POA: Diagnosis not present

## 2012-12-31 DIAGNOSIS — D631 Anemia in chronic kidney disease: Secondary | ICD-10-CM | POA: Diagnosis not present

## 2013-01-07 ENCOUNTER — Encounter: Payer: Self-pay | Admitting: Neurology

## 2013-01-07 ENCOUNTER — Telehealth: Payer: Self-pay | Admitting: Neurology

## 2013-01-11 ENCOUNTER — Encounter: Payer: Self-pay | Admitting: Neurology

## 2013-01-11 ENCOUNTER — Ambulatory Visit (INDEPENDENT_AMBULATORY_CARE_PROVIDER_SITE_OTHER): Payer: Medicare Other | Admitting: Neurology

## 2013-01-11 VITALS — BP 150/72 | HR 66 | Ht 60.0 in | Wt 168.0 lb

## 2013-01-11 DIAGNOSIS — M25539 Pain in unspecified wrist: Secondary | ICD-10-CM | POA: Diagnosis not present

## 2013-01-11 DIAGNOSIS — M25532 Pain in left wrist: Secondary | ICD-10-CM

## 2013-01-11 DIAGNOSIS — G609 Hereditary and idiopathic neuropathy, unspecified: Secondary | ICD-10-CM

## 2013-01-11 MED ORDER — PREGABALIN 50 MG PO CAPS: ORAL_CAPSULE | ORAL | Status: DC

## 2013-01-11 NOTE — Progress Notes (Signed)
[facility]  Provider:  Dr Hosie Poisson Referring Provider: Irena Cords, MD Primary Care Physician:  Everlene Other, DO  CC:  LUE pain  HPI:  [name] is a 72 y.o. female here as a referral from Dr. Arrie Aran for evaluation of LUE and RLE pain.  She notes that the symptoms began in mid to late June after a failed dialysis graft in her left upper extremity. She notes the graft was placed on June 19, was removed one week later. Shortly after she started noticing pain which has been continuous since then. It has not improved. It is localized mainly in the anterior surface of the forearm it can radiate up the shoulder. It does not radiate down past the wrist. She describes it as a constant pain, which feels like a block of ice or pins and needles worse with any pressure on it. Worse with reaching for items. The area feels numb: Heavy. She does feel some weakness in the whole arm. No improvement in symptoms over time. She has tried icing the area which did not help. Was recently given a compression sleeve which helps. She does note some swelling in the extremity too.   Also notices similar symptoms in her right leg which began around the same time. It starts at the knee and radiates down the lateral surface of the lower extremity to the small toe. Started as a pins and needles type sensation. Is constant. She does note some weakness with prolonged standing. No foot drop noted.  Has a complicated medical history, otherwise reports no acute issues. Receive dialysis 3 times a week, currently has a catheter in place.   Review of Systems: Out of a complete 14 system review, the patient complains of only the following symptoms, and all other reviewed systems are negative. Positive for numbness weakness restless leg  History   Social History  . Marital Status: Legally Separated    Spouse Name: N/A    Number of Children: 3  . Years of Education: N/A   Occupational History  .  Not on file.   Social History Main Topics  . Smoking status: Current Every Day Smoker -- 0.10 packs/day for 50 years    Types: Cigarettes  . Smokeless tobacco: Never Used     Comment: pt states she smokes "1" cig per day  . Alcohol Use: No  . Drug Use: No     Comment: No hx of IV drug use  . Sexually Active: No   Other Topics Concern  . Not on file   Social History Narrative   Lives in [place] with Daughter, granddaughters (twins) and mentally challenged niece.   Retired Buyer, retail.          Family History  Problem Relation Age of Onset  . Thyroid cancer Mother   . Heart disease Father   . Hypertension Father   . Breast cancer Sister     Past Medical History  Diagnosis Date  . Hyperlipidemia   . Hypertension   . Normal cardiac stress test 12/24/2009    lexiscan, imaging normal  . 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 Diverticulitis   . IBS (irritable bowel syndrome)   . Thyroid disease   . Depression   . Chronic kidney disease     Hemo MWF  . GERD (gastroesophageal reflux disease)   . H/O hiatal hernia   . Arthritis   . RLS (  restless legs syndrome)   . Tubular adenoma of colon 01/2008  . Seizures     2004  . Constipation     Past Surgical History  Procedure Laterality Date  . Cardiac catheterization  2003    normal  . Cholecystectomy      Open mid line incision  . Frontal craniotomy  2002    indication = sinusitis  . Appendectomy    . Tubal ligation    . Abdominal hysterectomy    . Insertion of dialysis catheter      Left  . Revison of arteriovenous fistula  05/11/2012    Procedure: REVISON OF ARTERIOVENOUS FISTULA;  Surgeon: Sherren Kerns, MD;  Location: [facility] OR;  Service: Vascular;  Laterality: Right;  . Av fistula placement Left 11/11/2012    Procedure: INSERTION OF ARTERIOVENOUS (AV) GORE-TEX GRAFT ARM;  Surgeon: Chuck Hint, MD;  Location: [facility] OR;  Service: Vascular;  Laterality: Left;  .  Avgg removal Left 11/18/2012    Procedure: REMOVAL OF LEFT UPPER ARM ARTERIOVENOUS GORETEX GRAFT (AVGG);  Surgeon: Chuck Hint, MD;  Location: [facility] OR;  Service: Vascular;  Laterality: Left;  . Patch angioplasty Left 11/18/2012    Procedure: PATCH ANGIOPLASTY;  Surgeon: Chuck Hint, MD;  Location: [facility] OR;  Service: Vascular;  Laterality: Left;    Current Outpatient Prescriptions  Medication Sig Dispense Refill  . acetaminophen (TYLENOL) 500 MG tablet Take 500 mg by mouth every 6 (six) hours as needed. For pain      . allopurinol (ZYLOPRIM) 100 MG tablet Only take on days following dialysis (Tues, Thurs, Sat).  30 tablet  6  . amLODipine (NORVASC) 10 MG tablet Take 1 tablet (10 mg total) by mouth daily.  90 tablet  3  . ARIPiprazole (ABILIFY) 2 MG tablet Take 1 mg by mouth every morning.      Marland Kitchen aspirin EC 81 MG tablet Take 81 mg by mouth every morning.      Marland Kitchen atorvastatin (LIPITOR) 40 MG tablet Take 1 tablet (40 mg total) by mouth every morning.  30 tablet  6  . Calcium Acetate 667 MG TABS       . Calcium Carbonate Antacid (TUMS PO) Take 1 tablet by mouth daily as needed. For stomach upset      . cloNIDine (CATAPRES) 0.1 MG tablet Take 1 tablet (0.1 mg total) by mouth 3 (three) times daily.  90 tablet  3  . cyclobenzaprine (FLEXERIL) 5 MG tablet TAKE ONE TABLET BY MOUTH THREE TIMES DAILY AS NEEDED FOR MUSCLE SPASM  30 tablet  0  . fluticasone (FLONASE) 50 MCG/ACT nasal spray Place 2 sprays into the nose daily.  16 g  6  . folic acid-vitamin b complex-vitamin c-selenium-zinc (DIALYVITE) 3 MG TABS Take 1 tablet by mouth daily.      Marland Kitchen lidocaine-prilocaine (EMLA) cream Apply 1 application topically 3 (three) times a week. Use before dialysis on MWF      . lisinopril (PRINIVIL,ZESTRIL) 20 MG tablet Take 20 mg by mouth daily.      Marland Kitchen loratadine (CLARITIN) 10 MG tablet Take 1 tablet (10 mg total) by mouth daily.  30 tablet  6  . LYRICA 50 MG capsule Take 1 capsule by mouth daily.      .  metoprolol succinate (TOPROL-XL) 100 MG 24 hr tablet Take 100 mg by mouth every evening. Take with or immediately following a meal.      . omeprazole (PRILOSEC) 20 MG capsule Take 40  mg by mouth 3 (three) times daily with meals.      Marland Kitchen oxyCODONE (ROXICODONE) 5 MG immediate release tablet Take 1 tablet (5 mg total) by mouth every 6 (six) hours as needed for pain.  30 tablet  0  . oxyCODONE-acetaminophen (ROXICET) 5-325 MG per tablet Take 1-2 tablets by mouth every 4 (four) hours as needed for pain.  20 tablet  0  . polyethylene glycol powder (GLYCOLAX/MIRALAX) powder Take 17 g by mouth daily as needed. For constipation      . senna (SENOKOT) 8.6 MG tablet Take 2 tablets by mouth daily as needed. For constipation      . sevelamer carbonate (RENVELA) 800 MG tablet Take 800 mg by mouth 3 (three) times daily with meals.       No current facility-administered medications for this visit.    Allergies as of 01/11/2013 - Review Complete 12/01/2012  Allergen Reaction Noted  . Codeine Nausea And Vomiting 03/01/2012  . Tuberculin tests Hives 06/28/2012  . Penicillins Rash 06/18/2005  . Sulfamethoxazole Rash 06/18/2005    Vitals: There were no vitals taken for this visit. Last Weight:  Wt Readings from Last 1 Encounters:  12/01/12 167 lb (75.751 kg)   Last Height:   Ht Readings from Last 1 Encounters:  12/01/12 5\' 1"  (1.549 m)     Physical exam: Exam: Gen: NAD, conversant Eyes: anicteric sclerae, moist conjunctivae HENT: Atraumatic Neck: Trachea midline; supple,  Lungs: CTA, no wheezing, rales, rhonic                          CV: RRR, no MRG Abdomen: Soft, non-tender;  Extremities: No peripheral edema  Skin: Normal temperature, no rash,  Psych: Appropriate affect, pleasant  Neuro: MS: AA&Ox3, appropriately interactive, normal affect   Speech: fluent w/o paraphasic error  Memory: good recent and remote recall  CN: PERRL, EOMI no nystagmus, no ptosis, sensation intact to LT V1-V3  bilat, face symmetric, no weakness, hearing grossly intact, palate elevates symmetrically, shoulder shrug 5/5 bilat,  tongue protrudes midline, no fasiculations noted.  Motor: noted some atrophy in L forearm compared to right Strength: 5/5  In all extremities except for 5-/5 in L triceps, pronation and finger extension, though may be pain related weakness  Coord: rapid alternating and point-to-point (FNF, HTS) movements intact.  Reflexes: symmetrical, bilat downgoing toes  Sens: LT intact in all extremities, touch to the L forearm anterior/posterior surface results in paresthesias/pain. Decreased LT, PP and temp to lateral surface of RLE from little toe to knee level  Gait: posture, stance, stride and arm-swing normal.    Assessment:  After physical and neurologic examination, review of laboratory studies, imaging, neurophysiology testing and pre-existing records, assessment will be reviewed on the problem list.  Plan:  Treatment plan and additional workup will be reviewed under Problem List.  72y/o woman presenting for initial evaluation of LUE paresthesias localized to the forearm and similar symptoms in the RLE that started shortly after an unsuccessful dialysis graft placement. Physical exam showed some hyperesthesia in the LUE forearm region and similar findings in RLE. Based on the time course there is concern that these could potentially be related to the procedure. The differential would also include a cervical radiculopathy though the presentation is atypical. Her history of renal failure with HD also puts her at risk of developing neuropathy though this is atypical for a neuropathy.   Unspecified hereditary and idiopathic peripheral neuropathy - Plan: pregabalin (LYRICA)  50 MG capsule, NCV with EMG(electromyography), TSH, Methylmalonic Acid, Serum  Pain in joint, forearm, left - Plan: pregabalin (LYRICA) 50 MG capsule, NCV with EMG(electromyography)   -will check EMG/NCS -check  B12, MMA and TSH -continue use of compression sleeve -can consider MRI of C spine in the future pending above workup -no neurological contraindication to re-attempt of graft placement as continued use of catheter has other risks -will increase Lyrica to 50mg  qhs and 50mg  after HD tiw.  -follow up in 3 to 4 months or as needed

## 2013-01-11 NOTE — Patient Instructions (Signed)
Overall you are doing fairly well but I do want to suggest a few things today:   As far as your medications are concerned, I would like to suggest we increase your Lyrica to help with the nerve pain. Continue taking a 50mg  capsule nightly. During the days you have dialysis take an additional 50mg  capsule after dialysis  As far as diagnostic testing: I would like to order an EMG/NCS to check your nerves and muscles  I would like to see you back in 3 to 4 months, sooner if we need to. Please call us with any interim questions, concerns, problems, updates or refill requests.   Please also call us for any test results so we can go over those with you on the phone.  My clinical assistant and will answer any of your questions and relay your messages to me and also relay most of my messages to you.   Our phone number is [phone]. We also have an after hours call service for urgent matters and there is a physician on-call for urgent questions. For any emergencies you know to call 911 or go to the nearest emergency room

## 2013-01-14 LAB — TSH: TSH: 1.05 u[IU]/mL (ref 0.450–4.500)

## 2013-01-14 LAB — METHYLMALONIC ACID, SERUM: Methylmalonic Acid: 865 nmol/L — ABNORMAL HIGH (ref 0–378)

## 2013-01-18 NOTE — Progress Notes (Signed)
Quick Note:  Left a message on the patient's vm relaying the results of their recent labs and Dr Minus Breeding advice. Contact information was also given so that the patient may call back if they have any questions. ______

## 2013-01-25 ENCOUNTER — Encounter: Payer: Medicare Other | Admitting: Neurology

## 2013-01-25 ENCOUNTER — Ambulatory Visit: Payer: Self-pay | Admitting: Vascular Surgery

## 2013-01-25 DIAGNOSIS — Z01810 Encounter for preprocedural cardiovascular examination: Secondary | ICD-10-CM | POA: Diagnosis not present

## 2013-01-25 DIAGNOSIS — Z01812 Encounter for preprocedural laboratory examination: Secondary | ICD-10-CM | POA: Diagnosis not present

## 2013-01-25 DIAGNOSIS — I10 Essential (primary) hypertension: Secondary | ICD-10-CM | POA: Diagnosis not present

## 2013-01-25 DIAGNOSIS — E7800 Pure hypercholesterolemia, unspecified: Secondary | ICD-10-CM | POA: Diagnosis not present

## 2013-01-25 DIAGNOSIS — I1311 Hypertensive heart and chronic kidney disease without heart failure, with stage 5 chronic kidney disease, or end stage renal disease: Secondary | ICD-10-CM | POA: Diagnosis not present

## 2013-01-25 DIAGNOSIS — N186 End stage renal disease: Secondary | ICD-10-CM | POA: Diagnosis not present

## 2013-01-25 LAB — CBC
HGB: 11.5 g/dL — ABNORMAL LOW (ref 12.0–16.0)
MCH: 31 pg (ref 26.0–34.0)
MCHC: 33.5 g/dL (ref 32.0–36.0)
WBC: 6.3 10*3/uL (ref 3.6–11.0)

## 2013-01-25 LAB — BASIC METABOLIC PANEL
Calcium, Total: 10.1 mg/dL (ref 8.5–10.1)
Chloride: 102 mmol/L (ref 98–107)
Co2: 30 mmol/L (ref 21–32)
Creatinine: 6.26 mg/dL — ABNORMAL HIGH (ref 0.60–1.30)
EGFR (African American): 7 — ABNORMAL LOW
Glucose: 74 mg/dL (ref 65–99)
Potassium: 4.2 mmol/L (ref 3.5–5.1)
Sodium: 137 mmol/L (ref 136–145)

## 2013-01-27 ENCOUNTER — Ambulatory Visit: Payer: Self-pay | Admitting: Vascular Surgery

## 2013-01-27 DIAGNOSIS — Z7982 Long term (current) use of aspirin: Secondary | ICD-10-CM | POA: Diagnosis not present

## 2013-01-27 DIAGNOSIS — Z885 Allergy status to narcotic agent status: Secondary | ICD-10-CM | POA: Diagnosis not present

## 2013-01-27 DIAGNOSIS — T82898A Other specified complication of vascular prosthetic devices, implants and grafts, initial encounter: Secondary | ICD-10-CM | POA: Diagnosis not present

## 2013-01-27 DIAGNOSIS — T82598A Other mechanical complication of other cardiac and vascular devices and implants, initial encounter: Secondary | ICD-10-CM | POA: Diagnosis not present

## 2013-01-27 DIAGNOSIS — N186 End stage renal disease: Secondary | ICD-10-CM | POA: Diagnosis not present

## 2013-01-27 DIAGNOSIS — E7800 Pure hypercholesterolemia, unspecified: Secondary | ICD-10-CM | POA: Diagnosis not present

## 2013-01-27 DIAGNOSIS — F17200 Nicotine dependence, unspecified, uncomplicated: Secondary | ICD-10-CM | POA: Diagnosis not present

## 2013-01-27 DIAGNOSIS — Z8249 Family history of ischemic heart disease and other diseases of the circulatory system: Secondary | ICD-10-CM | POA: Diagnosis not present

## 2013-01-27 DIAGNOSIS — I10 Essential (primary) hypertension: Secondary | ICD-10-CM | POA: Diagnosis not present

## 2013-01-27 DIAGNOSIS — Z992 Dependence on renal dialysis: Secondary | ICD-10-CM | POA: Diagnosis not present

## 2013-01-27 DIAGNOSIS — I871 Compression of vein: Secondary | ICD-10-CM | POA: Diagnosis not present

## 2013-01-27 DIAGNOSIS — I120 Hypertensive chronic kidney disease with stage 5 chronic kidney disease or end stage renal disease: Secondary | ICD-10-CM | POA: Diagnosis not present

## 2013-01-27 DIAGNOSIS — Z79899 Other long term (current) drug therapy: Secondary | ICD-10-CM | POA: Diagnosis not present

## 2013-01-27 DIAGNOSIS — Z882 Allergy status to sulfonamides status: Secondary | ICD-10-CM | POA: Diagnosis not present

## 2013-01-27 LAB — POTASSIUM: Potassium: 4.5 mmol/L (ref 3.5–5.1)

## 2013-01-30 DIAGNOSIS — N186 End stage renal disease: Secondary | ICD-10-CM | POA: Diagnosis not present

## 2013-01-31 DIAGNOSIS — N186 End stage renal disease: Secondary | ICD-10-CM | POA: Diagnosis not present

## 2013-01-31 DIAGNOSIS — D631 Anemia in chronic kidney disease: Secondary | ICD-10-CM | POA: Diagnosis not present

## 2013-01-31 DIAGNOSIS — N2581 Secondary hyperparathyroidism of renal origin: Secondary | ICD-10-CM | POA: Diagnosis not present

## 2013-02-13 ENCOUNTER — Other Ambulatory Visit: Payer: Self-pay | Admitting: Family Medicine

## 2013-02-15 ENCOUNTER — Ambulatory Visit (INDEPENDENT_AMBULATORY_CARE_PROVIDER_SITE_OTHER): Payer: Medicare Other | Admitting: Neurology

## 2013-02-15 ENCOUNTER — Encounter (INDEPENDENT_AMBULATORY_CARE_PROVIDER_SITE_OTHER): Payer: Self-pay

## 2013-02-15 DIAGNOSIS — G619 Inflammatory polyneuropathy, unspecified: Secondary | ICD-10-CM | POA: Diagnosis not present

## 2013-02-15 DIAGNOSIS — G609 Hereditary and idiopathic neuropathy, unspecified: Secondary | ICD-10-CM | POA: Insufficient documentation

## 2013-02-15 DIAGNOSIS — Z0289 Encounter for other administrative examinations: Secondary | ICD-10-CM

## 2013-02-15 DIAGNOSIS — R209 Unspecified disturbances of skin sensation: Secondary | ICD-10-CM | POA: Diagnosis not present

## 2013-02-15 DIAGNOSIS — M79609 Pain in unspecified limb: Secondary | ICD-10-CM

## 2013-02-15 DIAGNOSIS — M25532 Pain in left wrist: Secondary | ICD-10-CM

## 2013-02-15 NOTE — Addendum Note (Signed)
Addended by: Levert Feinstein on: 02/15/2013 09:46 AM   Modules accepted: Orders

## 2013-02-15 NOTE — Progress Notes (Addendum)
This is the addendum to the previous EMG nerve conduction study report dated February 15 2013.  Left median, ulnar motor responses were normal.  She had left AV fistula placement surgery just 2 weeks ago, still complains of scar pain, left forearm deep achy pain,  I would not perform EMG study of her left upper extremity at this point.

## 2013-02-15 NOTE — Procedures (Signed)
    [facility]  NCS (NERVE CONDUCTION STUDY) WITH EMG (ELECTROMYOGRAPHY) REPORT   STUDY DATE: 02/15/2013 PATIENT NAME: [name] DOB: [birth year] MRN: [id number]    TECHNOLOGIST: Gearldine Shown ELECTROMYOGRAPHER: Levert Feinstein M.D.  CLINICAL INFORMATION:   72 year old female, with history of end-stage renal disease, on hemodialysis, left arm AV fistula placement, now presenting with left arm the achy pain, right lateral leg shooting discomfort paresthesia  FINDINGS: NERVE CONDUCTION STUDY:  Right peroneal sensory response showed mildly decreased snap amplitude. Bilateral median, ulnar sensorimotor responses were normal.  Right tibial, peroneal to EDB motor responses were normal. Right ulnar and median motor responses were normal  NEEDLE ELECTROMYOGRAPHY: Selected needle examination was performed that right lower extremity muscles, and right lumbosacral paraspinal muscles.  Needle examination of right tibialis anterior, tibialis posterior, peroneal longus, vastus lateralis, biceps femoris long head was normal.  There is no spontaneous activity at right lumbosacral paraspinal muscles, right L4, L5, S1.  IMPRESSION:  This is an abnormal study. 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    INTERPRETING PHYSICIAN:   Levert Feinstein M.D. Ph.D. [facility] [address] [place], [place] [address] [phone]

## 2013-02-24 DIAGNOSIS — N186 End stage renal disease: Secondary | ICD-10-CM | POA: Diagnosis not present

## 2013-02-24 DIAGNOSIS — M79609 Pain in unspecified limb: Secondary | ICD-10-CM | POA: Diagnosis not present

## 2013-02-24 DIAGNOSIS — T82898A Other specified complication of vascular prosthetic devices, implants and grafts, initial encounter: Secondary | ICD-10-CM | POA: Diagnosis not present

## 2013-03-01 DIAGNOSIS — N186 End stage renal disease: Secondary | ICD-10-CM | POA: Diagnosis not present

## 2013-03-02 DIAGNOSIS — N186 End stage renal disease: Secondary | ICD-10-CM | POA: Diagnosis not present

## 2013-03-02 DIAGNOSIS — Z23 Encounter for immunization: Secondary | ICD-10-CM | POA: Diagnosis not present

## 2013-03-02 DIAGNOSIS — N2581 Secondary hyperparathyroidism of renal origin: Secondary | ICD-10-CM | POA: Diagnosis not present

## 2013-03-02 DIAGNOSIS — D631 Anemia in chronic kidney disease: Secondary | ICD-10-CM | POA: Diagnosis not present

## 2013-03-03 ENCOUNTER — Ambulatory Visit: Payer: Self-pay | Admitting: Vascular Surgery

## 2013-03-03 DIAGNOSIS — I871 Compression of vein: Secondary | ICD-10-CM | POA: Diagnosis not present

## 2013-03-03 DIAGNOSIS — N186 End stage renal disease: Secondary | ICD-10-CM | POA: Diagnosis not present

## 2013-03-03 DIAGNOSIS — Z7982 Long term (current) use of aspirin: Secondary | ICD-10-CM | POA: Diagnosis not present

## 2013-03-03 DIAGNOSIS — E7800 Pure hypercholesterolemia, unspecified: Secondary | ICD-10-CM | POA: Diagnosis not present

## 2013-03-03 DIAGNOSIS — I120 Hypertensive chronic kidney disease with stage 5 chronic kidney disease or end stage renal disease: Secondary | ICD-10-CM | POA: Diagnosis not present

## 2013-03-03 DIAGNOSIS — Z9889 Other specified postprocedural states: Secondary | ICD-10-CM | POA: Diagnosis not present

## 2013-03-03 DIAGNOSIS — Z79899 Other long term (current) drug therapy: Secondary | ICD-10-CM | POA: Diagnosis not present

## 2013-03-03 DIAGNOSIS — F17200 Nicotine dependence, unspecified, uncomplicated: Secondary | ICD-10-CM | POA: Diagnosis not present

## 2013-03-03 DIAGNOSIS — Z992 Dependence on renal dialysis: Secondary | ICD-10-CM | POA: Diagnosis not present

## 2013-03-03 DIAGNOSIS — R9389 Abnormal findings on diagnostic imaging of other specified body structures: Secondary | ICD-10-CM | POA: Diagnosis not present

## 2013-03-03 DIAGNOSIS — T82898A Other specified complication of vascular prosthetic devices, implants and grafts, initial encounter: Secondary | ICD-10-CM | POA: Diagnosis not present

## 2013-03-07 DIAGNOSIS — N186 End stage renal disease: Secondary | ICD-10-CM | POA: Diagnosis not present

## 2013-03-09 DIAGNOSIS — N186 End stage renal disease: Secondary | ICD-10-CM | POA: Diagnosis not present

## 2013-03-11 DIAGNOSIS — N186 End stage renal disease: Secondary | ICD-10-CM | POA: Diagnosis not present

## 2013-03-14 ENCOUNTER — Other Ambulatory Visit: Payer: Self-pay | Admitting: Gastroenterology

## 2013-03-14 MED ORDER — OMEPRAZOLE 40 MG PO CPDR: 40.0000 mg | DELAYED_RELEASE_CAPSULE | Freq: Every day | ORAL | Status: DC

## 2013-03-16 ENCOUNTER — Telehealth: Payer: Self-pay

## 2013-03-16 NOTE — Telephone Encounter (Signed)
Omeprazole was denied with her insurance until she has tried all of the preferred PPI's ( Dexilant, Nexium). Called patient and left a message for patient to return my call.

## 2013-03-17 NOTE — Telephone Encounter (Signed)
Left a message for patient to return my call. 

## 2013-03-29 ENCOUNTER — Ambulatory Visit: Payer: Self-pay | Admitting: Physician Assistant

## 2013-03-29 DIAGNOSIS — N186 End stage renal disease: Secondary | ICD-10-CM | POA: Diagnosis not present

## 2013-03-29 DIAGNOSIS — E785 Hyperlipidemia, unspecified: Secondary | ICD-10-CM | POA: Diagnosis not present

## 2013-03-29 DIAGNOSIS — I120 Hypertensive chronic kidney disease with stage 5 chronic kidney disease or end stage renal disease: Secondary | ICD-10-CM | POA: Diagnosis not present

## 2013-04-01 DIAGNOSIS — N186 End stage renal disease: Secondary | ICD-10-CM | POA: Diagnosis not present

## 2013-04-04 DIAGNOSIS — N2581 Secondary hyperparathyroidism of renal origin: Secondary | ICD-10-CM | POA: Diagnosis not present

## 2013-04-04 DIAGNOSIS — D631 Anemia in chronic kidney disease: Secondary | ICD-10-CM | POA: Diagnosis not present

## 2013-04-04 DIAGNOSIS — N186 End stage renal disease: Secondary | ICD-10-CM | POA: Diagnosis not present

## 2013-04-04 DIAGNOSIS — D509 Iron deficiency anemia, unspecified: Secondary | ICD-10-CM | POA: Diagnosis not present

## 2013-04-04 DIAGNOSIS — E870 Hyperosmolality and hypernatremia: Secondary | ICD-10-CM | POA: Diagnosis not present

## 2013-04-07 ENCOUNTER — Other Ambulatory Visit: Payer: Self-pay

## 2013-04-11 ENCOUNTER — Other Ambulatory Visit: Payer: Self-pay | Admitting: Nephrology

## 2013-04-11 DIAGNOSIS — M21371 Foot drop, right foot: Secondary | ICD-10-CM

## 2013-04-13 ENCOUNTER — Ambulatory Visit
Admission: RE | Admit: 2013-04-13 | Discharge: 2013-04-13 | Disposition: A | Discharge status: Home / Self Care | Payer: Medicare Other | Source: Ambulatory Visit | Attending: Nephrology | Admitting: Nephrology

## 2013-04-13 DIAGNOSIS — R930 Abnormal findings on diagnostic imaging of skull and head, not elsewhere classified: Secondary | ICD-10-CM | POA: Diagnosis not present

## 2013-04-13 DIAGNOSIS — M21371 Foot drop, right foot: Secondary | ICD-10-CM

## 2013-04-14 ENCOUNTER — Encounter: Payer: Self-pay | Admitting: Neurology

## 2013-04-14 ENCOUNTER — Ambulatory Visit (INDEPENDENT_AMBULATORY_CARE_PROVIDER_SITE_OTHER): Payer: Medicare Other | Admitting: Neurology

## 2013-04-14 VITALS — BP 205/78 | HR 73 | Ht 61.5 in | Wt 179.0 lb

## 2013-04-14 DIAGNOSIS — R29898 Other symptoms and signs involving the musculoskeletal system: Secondary | ICD-10-CM

## 2013-04-14 NOTE — Progress Notes (Signed)
[facility]  Provider:  Dr Hosie Poisson Referring Provider: Tommie Sams, DO Primary Care Physician:  Everlene Other, DO  CC:  Notices some dragging of her R leg for the past week.   Unsure when she started dragging her RLE. Feels weakness in the leg and pain in the thigh down to the foot. Pain is on the anterior surface of the thigh and then the whole foot. Described as an aching pain and a pins and needles type sensation. Around the time of the leg weakness noted trouble with word finding, knows what she wants to say but has trouble getting the words out. Last week went to HD and noted very high BP (SBP in the 240s). All last week has had elevated SBP in the 160s to 180s. No lumbar back pain. 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 Had recent EMG/NCS showing no signs of lumbar radiculopathy. No other acute concerns at this time.   Notes she has decreased her Lyrica to 50mg  daily per recs of her nephrologist.    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 She notes the graft was placed on June 19, was removed one week later. Shortly after she started noticing pain which has been continuous since then. It has not improved. It is localized mainly in the anterior surface of the forearm it can radiate up the shoulder. It does not radiate down past the wrist. She describes it as a constant pain, which feels like a block of ice or pins and needles worse with any pressure on it. Worse with reaching for items. The area feels numb: Heavy. She does feel some weakness in the whole arm. No improvement in symptoms over time. She has tried icing the area which did not help. Was recently given a compression sleeve which helps. She does note some swelling in the extremity too. Also notices similar symptoms in her right leg which began around the same time. It starts at the knee and radiates down  the lateral surface of the lower extremity to the small toe. Started as a pins and needles type sensation. Is constant. She does note some weakness with prolonged standing. No foot drop noted. Has a complicated medical history, otherwise reports no acute issues. Receive dialysis 3 times a week, currently has a catheter in place.   Review of Systems: Out of a complete 14 system review, the patient complains of only the following symptoms, and all other reviewed systems are negative. Positive for numbness weakness restless leg  History   Social History  . Marital Status: Legally Separated    Spouse Name: N/A    Number of Children: 3  . Years of Education: 13   Occupational History  . retired    Social History Main Topics  . Smoking status: Current Every Day Smoker -- 0.10 packs/day for 50 years    Types: Cigarettes  . Smokeless tobacco: Never Used     Comment: pt states she smokes "1" cig per day  . Alcohol Use: No  . Drug Use: No     Comment: No hx of IV drug use  . Sexual Activity: No   Other Topics Concern  . Not on file   Social History Narrative   Lives in [place] with Daughter, granddaughters (twins) and mentally challenged niece.   Retired Buyer, retail.   Patient is right-handed.   Patient has  a college education.   Patient drinks one cup of coffee every 1-2 days.             Family History  Problem Relation Age of Onset  . Thyroid cancer Mother   . Heart disease Father   . Hypertension Father   . Heart attack Father   . Breast cancer Sister   . Lupus Daughter     Past Medical History  Diagnosis Date  . Hyperlipidemia   . Hypertension   . Normal cardiac stress test 12/24/2009    lexiscan, imaging normal  . 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 Diverticulitis   . IBS (irritable bowel syndrome)   . Thyroid disease   . Depression   . Chronic kidney disease     Hemo MWF  . GERD (gastroesophageal reflux  disease)   . H/O hiatal hernia   . Arthritis   . RLS (restless legs syndrome)   . Tubular adenoma of colon 01/2008  . Seizures     2004  . Constipation   . Sinus complaint   . Dialysis patient     kidney    Past Surgical History  Procedure Laterality Date  . Cardiac catheterization  2003    normal  . Cholecystectomy      Open mid line incision  . Frontal craniotomy  2002    indication = sinusitis  . Appendectomy    . Tubal ligation    . Abdominal hysterectomy    . Insertion of dialysis catheter      Left  . Revison of arteriovenous fistula  05/11/2012    Procedure: REVISON OF ARTERIOVENOUS FISTULA;  Surgeon: Sherren Kerns, MD;  Location: [facility] OR;  Service: Vascular;  Laterality: Right;  . Av fistula placement Left 11/11/2012    Procedure: INSERTION OF ARTERIOVENOUS (AV) GORE-TEX GRAFT ARM;  Surgeon: Chuck Hint, MD;  Location: [facility] OR;  Service: Vascular;  Laterality: Left;  . Avgg removal Left 11/18/2012    Procedure: REMOVAL OF LEFT UPPER ARM ARTERIOVENOUS GORETEX GRAFT (AVGG);  Surgeon: Chuck Hint, MD;  Location: [facility] OR;  Service: Vascular;  Laterality: Left;  . Patch angioplasty Left 11/18/2012    Procedure: PATCH ANGIOPLASTY;  Surgeon: Chuck Hint, MD;  Location[facility] OR;  Service: Vascular;  Laterality: Left;  . Frontal cranialcity  2002    Current Outpatient Prescriptions  Medication Sig Dispense Refill  . acetaminophen (TYLENOL) 500 MG tablet Take 500 mg by mouth every 6 (six) hours as needed. For pain      . allopurinol (ZYLOPRIM) 100 MG tablet Only take on days following dialysis (Tues, Thurs, Sat).  30 tablet  6  . amLODipine (NORVASC) 10 MG tablet Take 1 tablet (10 mg total) by mouth daily.  90 tablet  3  . ARIPiprazole (ABILIFY) 2 MG tablet Take 1 mg by mouth every morning.      Marland Kitchen aspirin EC 81 MG tablet Take 81 mg by mouth every morning.      Marland Kitchen atorvastatin (LIPITOR) 40 MG tablet TAKE ONE TABLET BY MOUTH IN THE MORNING  30 tablet  6  .  Calcium Acetate 667 MG TABS       . Calcium Carbonate Antacid (TUMS PO) Take 1 tablet by mouth daily as needed. For stomach upset      . cloNIDine (CATAPRES) 0.1 MG tablet Take 1 tablet (0.1 mg total) by mouth 3 (three) times daily.  90 tablet  3  . cyclobenzaprine (FLEXERIL) 5 MG tablet TAKE ONE TABLET BY MOUTH THREE TIMES DAILY AS NEEDED FOR MUSCLE SPASM  30 tablet  0  . fluticasone (FLONASE) 50 MCG/ACT nasal spray Place 2 sprays into the nose daily.  16 g  6  . folic acid-vitamin b complex-vitamin c-selenium-zinc (DIALYVITE) 3 MG TABS Take 1 tablet by mouth daily.      Marland Kitchen lidocaine-prilocaine (EMLA) cream Apply 1 application topically 3 (three) times a week. Use before dialysis on MWF      . lisinopril (PRINIVIL,ZESTRIL) 20 MG tablet Take 20 mg by mouth daily.      Marland Kitchen loratadine (CLARITIN) 10 MG tablet Take 1 tablet (10 mg total) by mouth daily.  30 tablet  6  . metoprolol succinate (TOPROL-XL) 100 MG 24 hr tablet Take 100 mg by mouth every evening. Take with or immediately following a meal.      . omeprazole (PRILOSEC) 40 MG capsule Take 1 capsule (40 mg total) by mouth daily.  30 capsule  3  . oxyCODONE (ROXICODONE) 5 MG immediate release tablet Take 1 tablet (5 mg total) by mouth every 6 (six) hours as needed for pain.  30 tablet  0  . oxyCODONE-acetaminophen (ROXICET) 5-325 MG per tablet Take 1-2 tablets by mouth every 4 (four) hours as needed for pain.  20 tablet  0  . polyethylene glycol powder (GLYCOLAX/MIRALAX) powder Take 17 g by mouth daily as needed. For constipation      . pregabalin (LYRICA) 50 MG capsule Take one 50mg  capsule daily. Add additional 50mg  capsule 3 times per week after dialysis  60 capsule  3  . senna (SENOKOT) 8.6 MG tablet Take 2 tablets by mouth daily as needed. For constipation      . sevelamer carbonate (RENVELA) 800 MG tablet Take 800 mg by mouth 3 (three) times daily with meals.       No current facility-administered medications for this visit.    Allergies as  of 04/14/2013 - Review Complete 04/14/2013  Allergen Reaction Noted  . Codeine Nausea And Vomiting 03/01/2012  . Tuberculin tests Hives 06/28/2012  . Penicillins Rash 06/18/2005  . Sulfamethoxazole Rash 06/18/2005    Vitals: BP 205/78  Pulse 73  Ht 5' 1.5" (1.562 m)  Wt 179 lb (81.194 kg)  BMI 33.28 kg/m2 Last Weight:  Wt Readings from Last 1 Encounters:  04/14/13 179 lb (81.194 kg)   Last Height:   Ht Readings from Last 1 Encounters:  04/14/13 5' 1.5" (1.562 m)    Physical exam:  Exam:  Gen: NAD, conversant  Eyes: anicteric sclerae, moist conjunctivae  HENT: Atraumatic  Neck: Trachea midline; supple,  Lungs: CTA, no wheezing, rales, rhonic  CV: RRR, no MRG  Abdomen: Soft, non-tender;  Extremities: No peripheral edema  Skin: Normal temperature, no rash,  Psych: Appropriate affect, pleasant  Neuro:  MS: AA&Ox3, appropriately interactive, normal affect   Speech: fluent w/o paraphasic error, intact naming of high and low frequency items  Memory: good recent and remote recall   CN:  PERRL, EOMI no nystagmus, no ptosis, sensation intact to LT V1-V3 bilat, face symmetric, no weakness, hearing grossly intact, palate elevates symmetrically, shoulder shrug 5/5 bilat,  tongue protrudes midline, no fasiculations noted.  Motor: noted some atrophy in L forearm compared to right  Strength:  5/5 In all extremities except for 5-/5 in L triceps, noted weakness and proximal right lower extremity and a distal foot drop Coord: rapid alternating and point-to-point (  FNF, HTS) movements intact.  Reflexes: symmetrical, bilat downgoing toes  Sens: LT intact in all extremities, touch to the L forearm anterior/posterior surface results in paresthesias/pain. Decreased LT, PP and temp to lateral surface of RLE from little toe to knee level  Gait: posture, stance, stride and arm-swing normal  Assessment:  After physical and neurologic examination, review of laboratory studies, imaging,  neurophysiology testing and pre-existing records, assessment will be reviewed on the problem list.  Plan:  Treatment plan and additional workup will be reviewed under Problem List.  1)RLE weakness  72y/o woman presenting for followup visit with concerns of right lower extremity weakness sensory changes and transient word finding difficulties for the past 2 weeks. The weakness has persisted. She had head CT which was unremarkable. Physical exam is pertinent for proximal and distal lower showed he weakness on the right lower extremity. With patient's history would've concern for ischemic event versus lumbar radiculopathy causing weakness. Will check MRI of the brain and lumbar spine. Will continue patient on aspirin and statin for now.

## 2013-04-14 NOTE — Patient Instructions (Signed)
Overall you are doing fairly well but I do want to suggest a few things today:   Remember to drink plenty of fluid, eat healthy meals and do not skip any meals. Try to eat protein with a every meal and eat a healthy snack such as fruit or nuts in between meals. Try to keep a regular sleep-wake schedule and try to exercise daily, particularly in the form of walking, 20-30 minutes a day, if you can.   As far as diagnostic testing:  1)MRI of the brain and Lumbar spine  Please call us with any interim questions, concerns, problems, updates or refill requests.   Please also call us for any test results so we can go over those with you on the phone.  My clinical assistant and will answer any of your questions and relay your messages to me and also relay most of my messages to you.   Our phone number is [phone]. We also have an after hours call service for urgent matters and there is a physician on-call for urgent questions. For any emergencies you know to call 911 or go to the nearest emergency room

## 2013-04-18 DIAGNOSIS — N186 End stage renal disease: Secondary | ICD-10-CM | POA: Diagnosis not present

## 2013-04-18 DIAGNOSIS — I10 Essential (primary) hypertension: Secondary | ICD-10-CM | POA: Diagnosis not present

## 2013-04-18 DIAGNOSIS — T82898A Other specified complication of vascular prosthetic devices, implants and grafts, initial encounter: Secondary | ICD-10-CM | POA: Diagnosis not present

## 2013-04-18 DIAGNOSIS — I871 Compression of vein: Secondary | ICD-10-CM | POA: Diagnosis not present

## 2013-04-23 ENCOUNTER — Ambulatory Visit
Admission: RE | Admit: 2013-04-23 | Discharge: 2013-04-23 | Disposition: A | Discharge status: Home / Self Care | Payer: Medicare Other | Source: Ambulatory Visit | Attending: Neurology | Admitting: Neurology

## 2013-04-23 DIAGNOSIS — R29898 Other symptoms and signs involving the musculoskeletal system: Secondary | ICD-10-CM

## 2013-04-23 DIAGNOSIS — R9409 Abnormal results of other function studies of central nervous system: Secondary | ICD-10-CM | POA: Diagnosis not present

## 2013-04-23 DIAGNOSIS — R51 Headache: Secondary | ICD-10-CM | POA: Diagnosis not present

## 2013-04-25 ENCOUNTER — Telehealth: Payer: Self-pay | Admitting: Neurology

## 2013-04-26 ENCOUNTER — Other Ambulatory Visit: Payer: Self-pay | Admitting: Neurology

## 2013-04-26 DIAGNOSIS — T82898A Other specified complication of vascular prosthetic devices, implants and grafts, initial encounter: Secondary | ICD-10-CM | POA: Diagnosis not present

## 2013-04-26 DIAGNOSIS — N186 End stage renal disease: Secondary | ICD-10-CM | POA: Diagnosis not present

## 2013-04-26 DIAGNOSIS — I871 Compression of vein: Secondary | ICD-10-CM | POA: Diagnosis not present

## 2013-04-26 MED ORDER — ALPRAZOLAM 0.25 MG PO TABS: ORAL_TABLET | ORAL | Status: DC

## 2013-04-26 NOTE — Telephone Encounter (Signed)
Pt called and relayed that she was not able to do MRI Lspine (moved too much and in too much pain).  Needs some type of pain medication to proceed.  Please advise.  Xanax?  I see roxicet and flexeril in med list, but since 10/2012. (only 30 tabs).

## 2013-04-26 NOTE — Telephone Encounter (Signed)
Faxed to pharmacy,called patient to inform

## 2013-04-26 NOTE — Telephone Encounter (Signed)
Please let her know a  prescription for xanax 0.25mg  3 tablets was sent to her pharmacy.

## 2013-04-27 ENCOUNTER — Ambulatory Visit: Payer: Medicare Other | Admitting: Family Medicine

## 2013-05-01 DIAGNOSIS — N186 End stage renal disease: Secondary | ICD-10-CM | POA: Diagnosis not present

## 2013-05-02 DIAGNOSIS — D631 Anemia in chronic kidney disease: Secondary | ICD-10-CM | POA: Diagnosis not present

## 2013-05-02 DIAGNOSIS — N2581 Secondary hyperparathyroidism of renal origin: Secondary | ICD-10-CM | POA: Diagnosis not present

## 2013-05-02 DIAGNOSIS — N186 End stage renal disease: Secondary | ICD-10-CM | POA: Diagnosis not present

## 2013-05-05 ENCOUNTER — Encounter: Payer: Self-pay | Admitting: Family Medicine

## 2013-05-05 ENCOUNTER — Ambulatory Visit
Admission: RE | Admit: 2013-05-05 | Discharge: 2013-05-05 | Disposition: A | Discharge status: Home / Self Care | Payer: Medicare Other | Source: Ambulatory Visit | Attending: Neurology | Admitting: Neurology

## 2013-05-05 ENCOUNTER — Ambulatory Visit (INDEPENDENT_AMBULATORY_CARE_PROVIDER_SITE_OTHER): Payer: Medicare Other | Admitting: Family Medicine

## 2013-05-05 VITALS — BP 185/63 | HR 76 | Temp 98.1°F | Ht 61.0 in | Wt 179.0 lb

## 2013-05-05 DIAGNOSIS — Z760 Encounter for issue of repeat prescription: Secondary | ICD-10-CM

## 2013-05-05 DIAGNOSIS — I10 Essential (primary) hypertension: Secondary | ICD-10-CM | POA: Diagnosis not present

## 2013-05-05 DIAGNOSIS — R209 Unspecified disturbances of skin sensation: Secondary | ICD-10-CM | POA: Diagnosis not present

## 2013-05-05 MED ORDER — ARIPIPRAZOLE 2 MG PO TABS: 1.0000 mg | ORAL_TABLET | Freq: Every day | ORAL | Status: DC

## 2013-05-05 NOTE — Progress Notes (Signed)
Subjective:     Patient ID: [name], female   DOB: 03/09/1941, 72 y.o.   MRN: [id number]  HPI 72 year old female with an extensive PMH including ESRD presents for follow up.  1) Recent URI - Patient reports that her ears have "been stopped up"  - She scheduled appointment for this earlier but had canceled. Since that time her symptoms have resolved following Flonase and Claritin.   2) Medication refill  - Patient need a refill on Abilify.  - Patient also reports that her PPI is no longer covered and she needs additional medication   3) HTN Disease Monitoring: Home BP Monitoring - No Chest pain- No    Dyspnea- No Medications: Toprol XL, Amlodipine, Clonidine Compliance-  Yes Lightheadedness-  No  Edema- No   Review of Systems Per HPI    Objective:   Physical Exam Filed Vitals:   05/05/13 1025  BP: 185/63  Pulse: 76  Temp: 98.1 F (36.7 C)  Exam: General: well appearing elderly female in NAD. HEENT: NCAT. Normal TM's bilaterally.  Cardiovascular: RRR. No murmurs, rubs, or gallops. Respiratory: CTAB. No rales, rhonchi, or wheeze. Abdomen: soft, nontender, nondistended. Extremities: No LE edema.    Assessment/Plan:  See Problem List

## 2013-05-06 DIAGNOSIS — Z760 Encounter for issue of repeat prescription: Secondary | ICD-10-CM | POA: Insufficient documentation

## 2013-05-08 MED ORDER — DEXLANSOPRAZOLE 30 MG PO CPDR: 30.0000 mg | DELAYED_RELEASE_CAPSULE | Freq: Every day | ORAL | Status: DC

## 2013-05-08 NOTE — Assessment & Plan Note (Signed)
Will continue current regimen. Patient will likely need to increase frequency of Clonidine.  BP difficult to control given hypotension with dialysis.

## 2013-05-08 NOTE — Assessment & Plan Note (Signed)
Abilify refilled. Patient reports Prilosec no longer covered.  Will try Dexilant.

## 2013-05-09 ENCOUNTER — Telehealth: Payer: Self-pay | Admitting: Neurology

## 2013-05-09 NOTE — Telephone Encounter (Signed)
Patient is requesting MRI results from Thursday, informed that it was not ready and reviewed by physician as of yet, she verbalized understanding

## 2013-05-09 NOTE — Telephone Encounter (Signed)
NEEDS RESULTS OF MRI  °

## 2013-05-30 ENCOUNTER — Telehealth: Payer: Self-pay | Admitting: Family Medicine

## 2013-05-30 NOTE — Telephone Encounter (Signed)
Pt called and would like a referral for a mammogram and pap smear.jw

## 2013-06-01 ENCOUNTER — Ambulatory Visit: Payer: Self-pay | Admitting: Vascular Surgery

## 2013-06-01 DIAGNOSIS — Z01812 Encounter for preprocedural laboratory examination: Secondary | ICD-10-CM | POA: Diagnosis not present

## 2013-06-01 DIAGNOSIS — Z01810 Encounter for preprocedural cardiovascular examination: Secondary | ICD-10-CM | POA: Diagnosis not present

## 2013-06-01 DIAGNOSIS — N186 End stage renal disease: Secondary | ICD-10-CM | POA: Diagnosis not present

## 2013-06-01 DIAGNOSIS — J9819 Other pulmonary collapse: Secondary | ICD-10-CM | POA: Diagnosis not present

## 2013-06-01 DIAGNOSIS — I10 Essential (primary) hypertension: Secondary | ICD-10-CM | POA: Diagnosis not present

## 2013-06-01 LAB — BASIC METABOLIC PANEL
Anion Gap: 7 (ref 7–16)
BUN: 28 mg/dL — ABNORMAL HIGH (ref 7–18)
Chloride: 99 mmol/L (ref 98–107)
EGFR (Non-African Amer.): 6 — ABNORMAL LOW
Glucose: 83 mg/dL (ref 65–99)
Osmolality: 277 (ref 275–301)
Potassium: 3.9 mmol/L (ref 3.5–5.1)
Sodium: 136 mmol/L (ref 136–145)

## 2013-06-01 LAB — CBC
HCT: 35.4 % (ref 35.0–47.0)
MCH: 29.1 pg (ref 26.0–34.0)
Platelet: 168 10*3/uL (ref 150–440)
RBC: 3.97 10*6/uL (ref 3.80–5.20)
WBC: 7.5 10*3/uL (ref 3.6–11.0)

## 2013-06-03 DIAGNOSIS — N2581 Secondary hyperparathyroidism of renal origin: Secondary | ICD-10-CM | POA: Diagnosis not present

## 2013-06-03 DIAGNOSIS — N186 End stage renal disease: Secondary | ICD-10-CM | POA: Diagnosis not present

## 2013-06-03 DIAGNOSIS — D631 Anemia in chronic kidney disease: Secondary | ICD-10-CM | POA: Diagnosis not present

## 2013-06-03 DIAGNOSIS — D509 Iron deficiency anemia, unspecified: Secondary | ICD-10-CM | POA: Diagnosis not present

## 2013-06-16 ENCOUNTER — Telehealth: Payer: Self-pay | Admitting: Neurology

## 2013-06-16 NOTE — Telephone Encounter (Signed)
Please advise 

## 2013-06-16 NOTE — Telephone Encounter (Signed)
Patient calling to state that she is still having problems with drop foot and walking, and is wondering if she needs to be seen by Dr. Janann Colonel. Please call the patient back.

## 2013-06-16 NOTE — Telephone Encounter (Signed)
Please call her and schedule her for a follow up visit next week. Thanks.

## 2013-06-16 NOTE — Telephone Encounter (Signed)
Called to sched appt/confirmed w/ patient

## 2013-06-21 ENCOUNTER — Encounter: Payer: Self-pay | Admitting: Neurology

## 2013-06-21 ENCOUNTER — Ambulatory Visit (INDEPENDENT_AMBULATORY_CARE_PROVIDER_SITE_OTHER): Payer: Medicare Other | Admitting: Neurology

## 2013-06-21 ENCOUNTER — Ambulatory Visit: Payer: Medicare Other | Attending: Neurology | Admitting: Physical Therapy

## 2013-06-21 VITALS — BP 167/75 | HR 74 | Ht 62.5 in | Wt 186.0 lb

## 2013-06-21 DIAGNOSIS — IMO0001 Reserved for inherently not codable concepts without codable children: Secondary | ICD-10-CM | POA: Insufficient documentation

## 2013-06-21 DIAGNOSIS — M25549 Pain in joints of unspecified hand: Secondary | ICD-10-CM | POA: Diagnosis not present

## 2013-06-21 DIAGNOSIS — M21371 Foot drop, right foot: Secondary | ICD-10-CM

## 2013-06-21 DIAGNOSIS — M216X9 Other acquired deformities of unspecified foot: Secondary | ICD-10-CM

## 2013-06-21 NOTE — Patient Instructions (Signed)
Overall you are doing fairly well but I do want to suggest a few things today:   As far as diagnostic testing:  1)Please schedule an EMG/NCS  I would like you to see physical therapy for evaluation for a possible ankle foot orthotic which will help your walking.   I would like to see you back once the EMG/NCS is completed, sooner if we need to. Please call us with any interim questions, concerns, problems, updates or refill requests.   Please also call us for any test results so we can go over those with you on the phone.  My clinical assistant and will answer any of your questions and relay your messages to me and also relay most of my messages to you.   Our phone number is [phone]. We also have an after hours call service for urgent matters and there is a physician on-call for urgent questions. For any emergencies you know to call 911 or go to the nearest emergency room

## 2013-06-21 NOTE — Progress Notes (Signed)
[facility]  Provider:  Dr Janann Colonel Referring Provider: Coral Spikes, DO Primary Care Physician:  Thersa Salt, DO  CC:  RLE weakness  Ms Maines returns today with concerns of continued weakness in her right foot with a noted foot drop. She feels that she is having some continued difficulty with walking due to inability to lift the foot. Causing fatigue and some trips/falls. Notes some sensory loss in her big toe on the right. Notes some swelling in the foot too. 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   Prior visit 03/2013 Unsure when she started dragging her RLE. Feels weakness in the leg and pain in the thigh down to the foot. Pain is on the anterior surface of the thigh and then the whole foot. Described as an aching pain and a pins and needles type sensation. Around the time of the leg weakness noted trouble with word finding, knows what she wants to say but has trouble getting the words out. Last week went to HD and noted very high BP (SBP in the 240s). All last week has had elevated SBP in the 160s to 180s. No lumbar back pain. 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 Had recent EMG/NCS showing no signs of lumbar radiculopathy. No other acute concerns at this time.   Notes she has decreased her Lyrica to 50mg  daily per recs of her nephrologist.    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 She notes the graft was placed on June 19, was removed one week later. Shortly after she started noticing pain which has been continuous since then. It has not improved. It is localized mainly in the anterior surface of the forearm it can radiate up the shoulder. It does not radiate down past the wrist. She describes it as a constant pain, which feels like a block of ice or pins and needles worse with  any pressure on it. Worse with reaching for items. The area feels numb: Heavy. She does feel some weakness in the whole arm. No improvement in symptoms over time. She has tried icing the area which did not help. Was recently given a compression sleeve which helps. She does note some swelling in the extremity too. Also notices similar symptoms in her right leg which began around the same time. It starts at the knee and radiates down the lateral surface of the lower extremity to the small toe. Started as a pins and needles type sensation. Is constant. She does note some weakness with prolonged standing. No foot drop noted. Has a complicated medical history, otherwise reports no acute issues. Receive dialysis 3 times a week, currently has a catheter in place.   Review of Systems: Out of a complete 14 system review, the patient complains of only the following symptoms, and all other reviewed systems are negative. Positive for numbness weakness restless leg  History   Social History  . Marital Status: Legally Separated    Spouse Name: N/A    Number of Children: 3  . Years of Education: 13   Occupational History  . retired    Social History Main Topics  . Smoking status: Current Every Day Smoker -- 0.10 packs/day for 50 years    Types: Cigarettes  . Smokeless tobacco: Never Used     Comment: pt states she smokes "1"  cig per day  . Alcohol Use: No  . Drug Use: No     Comment: No hx of IV drug use  . Sexual Activity: No   Other Topics Concern  . Not on file   Social History Narrative   Lives in [place] with Daughter, granddaughters (twins) and mentally challenged niece.   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   Patient has a college education.   Patient drinks one cup of coffee every 1-2 days.             Family History  Problem Relation Age of Onset  . Thyroid cancer Mother   . Heart disease Father   . Hypertension Father   . Heart attack Father   . Breast  cancer Sister   . Lupus Daughter     Past Medical History  Diagnosis Date  . Hyperlipidemia   . Hypertension   . Normal cardiac stress test 12/24/2009    lexiscan, imaging normal  . 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 Diverticulitis   . IBS (irritable bowel syndrome)   . Thyroid disease   . Depression   . Chronic kidney disease     Hemo MWF  . GERD (gastroesophageal reflux disease)   . H/O hiatal hernia   . Arthritis   . RLS (restless legs syndrome)   . Tubular adenoma of colon 01/2008  . Seizures     2004  . Constipation   . Sinus complaint   . Dialysis patient     kidney    Past Surgical History  Procedure Laterality Date  . Cardiac catheterization  2003    normal  . Cholecystectomy      Open mid line incision  . Frontal craniotomy  2002    indication = sinusitis  . Appendectomy    . Tubal ligation    . Abdominal hysterectomy    . Insertion of dialysis catheter      Left  . Revison of arteriovenous fistula  05/11/2012    Procedure: REVISON OF ARTERIOVENOUS FISTULA;  Surgeon: Elam Dutch, MD;  Location: [place];  Service: Vascular;  Laterality: Right;  . Av fistula placement Left 11/11/2012    Procedure: INSERTION OF ARTERIOVENOUS (AV) GORE-TEX GRAFT ARM;  Surgeon: Angelia Mould, MD;  Location: [place];  Service: Vascular;  Laterality: Left;  . Avgg removal Left 11/18/2012    Procedure: REMOVAL OF LEFT UPPER ARM ARTERIOVENOUS GORETEX GRAFT ([place]);  Surgeon: Angelia Mould, MD;  Location: [facility];  Service: Vascular;  Laterality: Left;  . Patch angioplasty Left 11/18/2012    Procedure: PATCH ANGIOPLASTY;  Surgeon: Angelia Mould, MD;  Location: [facility];  Service: Vascular;  Laterality: Left;  . Frontal cranialcity  2002    Current Outpatient Prescriptions  Medication Sig Dispense Refill  . acetaminophen (TYLENOL) 500 MG tablet Take 500 mg by mouth every 6 (six) hours as needed. For pain      . allopurinol  (ZYLOPRIM) 100 MG tablet Only take on days following dialysis (Tues, Thurs, Sat).  30 tablet  6  . amLODipine (NORVASC) 10 MG tablet Take 1 tablet (10 mg total) by mouth daily.  90 tablet  3  . ARIPiprazole (ABILIFY) 2 MG tablet Take 0.5 tablets (1 mg total) by mouth daily.  90 tablet  3  . aspirin EC 81 MG tablet Take 81 mg by mouth every morning.      Marland Kitchen  atorvastatin (LIPITOR) 40 MG tablet TAKE ONE TABLET BY MOUTH IN THE MORNING  30 tablet  6  . Calcium Acetate 667 MG TABS       . Calcium Carbonate Antacid (TUMS PO) Take 1 tablet by mouth daily as needed. For stomach upset      . cloNIDine (CATAPRES) 0.1 MG tablet Take 1 tablet (0.1 mg total) by mouth 3 (three) times daily.  90 tablet  3  . cyclobenzaprine (FLEXERIL) 5 MG tablet TAKE ONE TABLET BY MOUTH THREE TIMES DAILY AS NEEDED FOR MUSCLE SPASM  30 tablet  0  . Dexlansoprazole 30 MG capsule Take 1 capsule (30 mg total) by mouth daily.  30 capsule  3  . fluticasone (FLONASE) 50 MCG/ACT nasal spray Place 2 sprays into the nose daily.  16 g  6  . folic acid-vitamin b complex-vitamin c-selenium-zinc (DIALYVITE) 3 MG TABS Take 1 tablet by mouth daily.      Marland Kitchen lidocaine-prilocaine (EMLA) cream Apply 1 application topically 3 (three) times a week. Use before dialysis on MWF      . loratadine (CLARITIN) 10 MG tablet Take 1 tablet (10 mg total) by mouth daily.  30 tablet  6  . metoprolol succinate (TOPROL-XL) 100 MG 24 hr tablet Take 100 mg by mouth every evening. Take with or immediately following a meal.      . oxyCODONE (ROXICODONE) 5 MG immediate release tablet Take 1 tablet (5 mg total) by mouth every 6 (six) hours as needed for pain.  30 tablet  0  . oxyCODONE-acetaminophen (ROXICET) 5-325 MG per tablet Take 1-2 tablets by mouth every 4 (four) hours as needed for pain.  20 tablet  0  . polyethylene glycol powder (GLYCOLAX/MIRALAX) powder Take 17 g by mouth daily as needed. For constipation      . pregabalin (LYRICA) 50 MG capsule Take one 50mg   capsule daily. Add additional 50mg  capsule 3 times per week after dialysis  60 capsule  3  . senna (SENOKOT) 8.6 MG tablet Take 2 tablets by mouth daily as needed. For constipation      . sevelamer carbonate (RENVELA) 800 MG tablet Take 800 mg by mouth 3 (three) times daily with meals.       No current facility-administered medications for this visit.    Allergies as of 06/21/2013 - Review Complete 06/21/2013  Allergen Reaction Noted  . Codeine Nausea And Vomiting 03/01/2012  . Tuberculin tests Hives 06/28/2012  . Penicillins Rash 06/18/2005  . Sulfamethoxazole Rash 06/18/2005    Vitals: BP 167/75  Pulse 74  Ht 5' 2.5" (1.588 m)  Wt 186 lb (84.369 kg)  BMI 33.46 kg/m2 Last Weight:  Wt Readings from Last 1 Encounters:  06/21/13 186 lb (84.369 kg)   Last Height:   Ht Readings from Last 1 Encounters:  06/21/13 5' 2.5" (1.588 m)    Physical exam:  Exam:  Gen: NAD, conversant  Eyes: anicteric sclerae, moist conjunctivae  HENT: Atraumatic  Neck: Trachea midline; supple,  Lungs: CTA, no wheezing, rales, rhonic  CV: RRR, no MRG  Abdomen: Soft, non-tender;  Extremities: No peripheral edema  Skin: Normal temperature, no rash,  Psych: Appropriate affect, pleasant  Neuro:  MS: AA&Ox3, appropriately interactive, normal affect   Speech: fluent w/o paraphasic error, intact naming of high and low frequency items  Memory: good recent and remote recall   CN:  PERRL, EOMI no nystagmus, no ptosis, sensation intact to LT V1-V3 bilat, face symmetric, no weakness, hearing grossly intact, palate  elevates symmetrically, shoulder shrug 5/5 bilat,  tongue protrudes midline, no fasiculations noted.  Motor: noted some atrophy in L forearm compared to right  Strength:  5/5 In upper extremities except for 5-/5 in L triceps, noted weakness inproximal right lower extremity and a distal foot drop on the right Coord: rapid alternating and point-to-point (FNF, HTS) movements intact.  Reflexes:  symmetrical, bilat downgoing toes  Sens: LT intact in all extremities, touch to the L forearm anterior/posterior surface results in paresthesias/pain. Decreased LT, PP and temp to lateral surface of RLE from little toe to knee level  Gait: posture, stance, stride and arm-swing normal  Assessment:  After physical and neurologic examination, review of laboratory studies, imaging, neurophysiology testing and pre-existing records, assessment will be reviewed on the problem list.  Plan:  Treatment plan and additional workup will be reviewed under Problem List.  1)RLE weakness  72y/o woman presenting for followup visit with concerns of right lower extremity weakness sensory changes and transient word finding difficulties for the past 2 weeks. The weakness has persisted. 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 Unclear if current weakness represents an exacerbation of these chronic changes. Differential would also include a radiculopathy vs a peripheral neuropathy. Will check EMG/NCS. Refer to PT for evaluation for possible AFO. Will continue patient on aspirin and statin for now.

## 2013-06-22 ENCOUNTER — Telehealth: Payer: Self-pay | Admitting: *Deleted

## 2013-06-22 NOTE — Telephone Encounter (Signed)
Heather form [facility] office called to request NPI number for patient.  Pt has appt for follow-up.  NPI given. Derl Barrow, RN

## 2013-06-23 ENCOUNTER — Ambulatory Visit (INDEPENDENT_AMBULATORY_CARE_PROVIDER_SITE_OTHER): Payer: Medicare Other | Admitting: Neurology

## 2013-06-23 ENCOUNTER — Encounter (INDEPENDENT_AMBULATORY_CARE_PROVIDER_SITE_OTHER): Payer: Self-pay

## 2013-06-23 ENCOUNTER — Other Ambulatory Visit: Payer: Self-pay | Admitting: Neurology

## 2013-06-23 DIAGNOSIS — R202 Paresthesia of skin: Secondary | ICD-10-CM

## 2013-06-23 DIAGNOSIS — R209 Unspecified disturbances of skin sensation: Secondary | ICD-10-CM | POA: Diagnosis not present

## 2013-06-23 DIAGNOSIS — Z0289 Encounter for other administrative examinations: Secondary | ICD-10-CM

## 2013-06-23 DIAGNOSIS — R531 Weakness: Secondary | ICD-10-CM

## 2013-06-23 DIAGNOSIS — R5381 Other malaise: Secondary | ICD-10-CM

## 2013-06-23 DIAGNOSIS — R5383 Other fatigue: Secondary | ICD-10-CM | POA: Diagnosis not present

## 2013-06-23 DIAGNOSIS — R1310 Dysphagia, unspecified: Secondary | ICD-10-CM

## 2013-06-23 MED ORDER — ALPRAZOLAM 0.25 MG PO TABS: 0.2500 mg | ORAL_TABLET | ORAL | Status: DC | PRN

## 2013-06-23 NOTE — Procedures (Signed)
[facility]  NCS (NERVE CONDUCTION STUDY) WITH EMG (ELECTROMYOGRAPHY) REPORT   STUDY DATE: Jun 23, 2013 PATIENT NAME: [name] DOB: 1940/07/15 MRN: [id number]    TECHNOLOGIST: Laretta Alstrom ELECTROMYOGRAPHER: Marcial Pacas M.D.  CLINICAL INFORMATION:  73 years old [place] American female, end-stage renal disease, on hemodialysis, noticed right lateral leg paresthesia since June 2014, denies significant low back pain, around November 2014, she also developed right leg weakness, she denies bowel and bladder incontinence, mild paresthesia at left lateral leg  On examination, bilateral upper extremity motor strength was normal. Ankle dorsi flexion (R/L): 0/5, ankle plantarflexion 3/5, inversion 4/5 eversion 3/5,  she also has mild left toe extension weakness, Bilateral lower extremity proximal muscle strength was normal.  There was decreased light touch at top of bilateral foot, extending to bilateral lower lateral leg also at the calf.  Bilateral upper extremity deep tendon reflexes 2/2 bilateral lower extremity deep tendon reflexes were absent.      FINDINGS: NERVE CONDUCTION STUDY: Right peroneal sensory response showed mildly decreased Snap amplitude, with normal distal latency. Left peroneal sensory response was normal. Bilateral tibial motor responses were normal, there was no significant change compared to previous study in September 2014. there was significantly decreased C. map amplitude at the right peroneal to EDB motor response,  which is a significant change compared to study in September 2014, she had a normal right peroneal to EDB motor response at that time . Right peroneal to tibialis anterior was also absent. Left peroneal to EDB motor responses was normal   NEEDLE ELECTROMYOGRAPHY:  selected needle examination was performed at bilateral lower extremity muscles, right upper extremity muscle, right lumbosacral paraspinal muscles  right tibialis  anterior: Increased insertion activity, 4 plus spontaneous activity, I did not see any active motor unit unit potential  Right peroneal longus: Increased insertion activity 3 plus spontaneous activity complex enlarged motor unit potential was decreased recruitment patterns,,   Right tibialis posterior: Increased insertion activity, 1 plus spontaneous activity, enlarged complex motor unit potential, with mildly decreased recruitment patterns Right medial gastrocnemius: Increased insertion activity no spontaneous activity mixture of normal and some enlarged motor unit potential with mildly decreased recruitment patterns  Right vastus lateralis normally insertion activity no spontaneous activity mixture of normal and so enlarged motor unit potential with decreased recruitment patterns        right biceps femoris long head, biceps femoris short head,  gluteus medius: normally insertion activity no spontaneous activity mixture of normal and some enlarged motor unit potential with decreased recruitment patterns   left vastus lateralis, tibialis posterior, tibialis anterior, normally insertion activity, no spontaneous activity, mildly enlarged motor unit potential, with mildly decreased recruitment patterns.    she has poor relaxation of lumbar paraspinal muscle examination, motor unit potential was mildly enlarged, I do not see significant spontaneous activity   Needle examination of right pronator teres, triceps, extensor digitorum communis, was normal  Right biceps, normally insertion activity, no spontaneous activity, mixture of normal, some enlarged motor unit potential, with mildly decreased recruitment patterns  IMPRESSION:   This is of a marked abnormal study, there is evidence of active neuropathic changes involving bilateral lumbar sacral myotomes, especially right L4, L5, S1, mild to moderate chronic neuropathic changes involving left side, consistent with bilateral lumbosacral  polyradiculopathies.  Differentiation diagnosis includes lumbar spondylitic polyradiculopathy vs. ischemic radiculopathies.      INTERPRETING PHYSICIAN:   Marcial Pacas M.D. Ph.D. [facility] [address], [place],  [place] [address] [phone]

## 2013-06-24 ENCOUNTER — Telehealth: Payer: Self-pay | Admitting: Neurology

## 2013-06-24 ENCOUNTER — Ambulatory Visit: Payer: Medicare Other | Admitting: Physical Therapy

## 2013-06-24 NOTE — Telephone Encounter (Signed)
Has been calling to try and get her brace and they need the order. To [facility]

## 2013-06-27 ENCOUNTER — Telehealth: Payer: Self-pay | Admitting: *Deleted

## 2013-06-27 NOTE — Telephone Encounter (Signed)
Spoke with patient's daughter and was told that company will need a medical letter of necessity, and last OV notes faxed to their office, [phone]

## 2013-06-28 ENCOUNTER — Encounter: Payer: Self-pay | Admitting: Neurology

## 2013-06-28 ENCOUNTER — Ambulatory Visit: Payer: Medicare Other | Admitting: Physical Therapy

## 2013-06-28 NOTE — Telephone Encounter (Signed)
Letter sent to be faxed

## 2013-06-28 NOTE — Telephone Encounter (Signed)
Spoke with patient's daughter and was told that company will need a medical letter of necessity, and last OV notes faxed to their office, fax#[phone] 

## 2013-06-29 DIAGNOSIS — N186 End stage renal disease: Secondary | ICD-10-CM | POA: Diagnosis not present

## 2013-06-29 NOTE — Telephone Encounter (Signed)
Faxed signed letter/ LOV notes to [facility] [employer] [facility]-received confirmation

## 2013-06-30 ENCOUNTER — Ambulatory Visit: Payer: Medicare Other | Admitting: Physical Therapy

## 2013-07-02 ENCOUNTER — Ambulatory Visit
Admission: RE | Admit: 2013-07-02 | Discharge: 2013-07-02 | Disposition: A | Discharge status: Home / Self Care | Payer: Medicare Other | Source: Ambulatory Visit | Attending: Neurology | Admitting: Neurology

## 2013-07-02 DIAGNOSIS — N186 End stage renal disease: Secondary | ICD-10-CM | POA: Diagnosis not present

## 2013-07-02 DIAGNOSIS — R209 Unspecified disturbances of skin sensation: Secondary | ICD-10-CM | POA: Diagnosis not present

## 2013-07-02 DIAGNOSIS — R269 Unspecified abnormalities of gait and mobility: Secondary | ICD-10-CM

## 2013-07-02 DIAGNOSIS — R531 Weakness: Secondary | ICD-10-CM

## 2013-07-02 DIAGNOSIS — R202 Paresthesia of skin: Secondary | ICD-10-CM

## 2013-07-04 ENCOUNTER — Telehealth: Payer: Self-pay | Admitting: Neurology

## 2013-07-04 DIAGNOSIS — N2581 Secondary hyperparathyroidism of renal origin: Secondary | ICD-10-CM | POA: Diagnosis not present

## 2013-07-04 DIAGNOSIS — T8189XA Other complications of procedures, not elsewhere classified, initial encounter: Secondary | ICD-10-CM | POA: Diagnosis not present

## 2013-07-04 DIAGNOSIS — D631 Anemia in chronic kidney disease: Secondary | ICD-10-CM | POA: Diagnosis not present

## 2013-07-04 DIAGNOSIS — N186 End stage renal disease: Secondary | ICD-10-CM | POA: Diagnosis not present

## 2013-07-04 DIAGNOSIS — D509 Iron deficiency anemia, unspecified: Secondary | ICD-10-CM | POA: Diagnosis not present

## 2013-07-04 NOTE — Telephone Encounter (Signed)
Patient requesting MRI results. Dr. Janann Colonel patient. Please advise.

## 2013-07-04 NOTE — Telephone Encounter (Signed)
MRI T-spine shows mild degenerative changes, nothing surgical, no cord changes or compression.

## 2013-07-04 NOTE — Telephone Encounter (Signed)
Patient calling about MRI results. Please call patient back.

## 2013-07-05 ENCOUNTER — Ambulatory Visit: Payer: Medicare Other | Attending: Neurology | Admitting: Physical Therapy

## 2013-07-05 DIAGNOSIS — IMO0001 Reserved for inherently not codable concepts without codable children: Secondary | ICD-10-CM | POA: Insufficient documentation

## 2013-07-05 DIAGNOSIS — M25549 Pain in joints of unspecified hand: Secondary | ICD-10-CM | POA: Diagnosis not present

## 2013-07-05 NOTE — Telephone Encounter (Signed)
Patient was given her test results.  Patient was advised to call the office with any questions or concerns.  Patient verbalized understanding.  After ending the call, saw in the note that Dr. Janann Colonel does want to see the patient back.  The patient was called and left a detailed message to call the office and schedule an appointment with Dr. Janann Colonel.

## 2013-07-07 ENCOUNTER — Ambulatory Visit: Payer: Self-pay | Admitting: Vascular Surgery

## 2013-07-07 ENCOUNTER — Ambulatory Visit: Payer: Medicare Other | Admitting: Physical Therapy

## 2013-07-07 DIAGNOSIS — Z8673 Personal history of transient ischemic attack (TIA), and cerebral infarction without residual deficits: Secondary | ICD-10-CM | POA: Diagnosis not present

## 2013-07-07 DIAGNOSIS — D649 Anemia, unspecified: Secondary | ICD-10-CM | POA: Diagnosis not present

## 2013-07-07 DIAGNOSIS — Z882 Allergy status to sulfonamides status: Secondary | ICD-10-CM | POA: Diagnosis not present

## 2013-07-07 DIAGNOSIS — Z79899 Other long term (current) drug therapy: Secondary | ICD-10-CM | POA: Diagnosis not present

## 2013-07-07 DIAGNOSIS — N186 End stage renal disease: Secondary | ICD-10-CM | POA: Diagnosis not present

## 2013-07-07 DIAGNOSIS — M129 Arthropathy, unspecified: Secondary | ICD-10-CM | POA: Diagnosis not present

## 2013-07-07 DIAGNOSIS — T82598A Other mechanical complication of other cardiac and vascular devices and implants, initial encounter: Secondary | ICD-10-CM | POA: Diagnosis not present

## 2013-07-07 DIAGNOSIS — I120 Hypertensive chronic kidney disease with stage 5 chronic kidney disease or end stage renal disease: Secondary | ICD-10-CM | POA: Diagnosis not present

## 2013-07-07 DIAGNOSIS — K219 Gastro-esophageal reflux disease without esophagitis: Secondary | ICD-10-CM | POA: Diagnosis not present

## 2013-07-07 DIAGNOSIS — J329 Chronic sinusitis, unspecified: Secondary | ICD-10-CM | POA: Diagnosis not present

## 2013-07-07 DIAGNOSIS — Z880 Allergy status to penicillin: Secondary | ICD-10-CM | POA: Diagnosis not present

## 2013-07-07 DIAGNOSIS — Z7982 Long term (current) use of aspirin: Secondary | ICD-10-CM | POA: Diagnosis not present

## 2013-07-07 DIAGNOSIS — K449 Diaphragmatic hernia without obstruction or gangrene: Secondary | ICD-10-CM | POA: Diagnosis not present

## 2013-07-07 DIAGNOSIS — Z885 Allergy status to narcotic agent status: Secondary | ICD-10-CM | POA: Diagnosis not present

## 2013-07-07 DIAGNOSIS — M216X9 Other acquired deformities of unspecified foot: Secondary | ICD-10-CM | POA: Diagnosis not present

## 2013-07-07 LAB — BASIC METABOLIC PANEL
Anion Gap: 7 (ref 7–16)
BUN: 30 mg/dL — ABNORMAL HIGH (ref 7–18)
CHLORIDE: 100 mmol/L (ref 98–107)
CO2: 29 mmol/L (ref 21–32)
Calcium, Total: 10 mg/dL (ref 8.5–10.1)
Creatinine: 7.07 mg/dL — ABNORMAL HIGH (ref 0.60–1.30)
EGFR (Non-African Amer.): 5 — ABNORMAL LOW
GFR CALC AF AMER: 6 — AB
GLUCOSE: 75 mg/dL (ref 65–99)
Osmolality: 277 (ref 275–301)
POTASSIUM: 4 mmol/L (ref 3.5–5.1)
Sodium: 136 mmol/L (ref 136–145)

## 2013-07-07 LAB — CBC
HCT: 37.2 % (ref 35.0–47.0)
HGB: 12.1 g/dL (ref 12.0–16.0)
MCH: 29.4 pg (ref 26.0–34.0)
MCHC: 32.6 g/dL (ref 32.0–36.0)
MCV: 90 fL (ref 80–100)
Platelet: 149 10*3/uL — ABNORMAL LOW (ref 150–440)
RBC: 4.13 10*6/uL (ref 3.80–5.20)
RDW: 18.4 % — AB (ref 11.5–14.5)
WBC: 6.1 10*3/uL (ref 3.6–11.0)

## 2013-07-11 ENCOUNTER — Encounter: Payer: Self-pay | Admitting: Neurology

## 2013-07-11 ENCOUNTER — Ambulatory Visit (INDEPENDENT_AMBULATORY_CARE_PROVIDER_SITE_OTHER): Payer: Medicare Other | Admitting: Neurology

## 2013-07-11 VITALS — BP 154/67 | HR 78 | Ht 62.5 in | Wt 174.0 lb

## 2013-07-11 DIAGNOSIS — T8189XA Other complications of procedures, not elsewhere classified, initial encounter: Secondary | ICD-10-CM | POA: Diagnosis not present

## 2013-07-11 DIAGNOSIS — M216X9 Other acquired deformities of unspecified foot: Secondary | ICD-10-CM | POA: Diagnosis not present

## 2013-07-11 DIAGNOSIS — M21371 Foot drop, right foot: Secondary | ICD-10-CM

## 2013-07-11 NOTE — Patient Instructions (Signed)
Overall you are doing fairly well but I do want to suggest a few things today:   Remember to drink plenty of fluid, eat healthy meals and do not skip any meals. Try to eat protein with a every meal and eat a healthy snack such as fruit or nuts in between meals. Try to keep a regular sleep-wake schedule and try to exercise daily, particularly in the form of walking, 20-30 minutes a day, if you can.   Continue to use your ankle foot orthotic. We will continue with physical therapy once we get your medical issues taken care of  I will place a referral to [facility] to see one of their specialists   Please call us with any interim questions, concerns, problems, updates or refill requests.   My clinical assistant and will answer any of your questions and relay your messages to me and also relay most of my messages to you.   Our phone number is [phone]. We also have an after hours call service for urgent matters and there is a physician on-call for urgent questions. For any emergencies you know to call 911 or go to the nearest emergency room

## 2013-07-11 NOTE — Progress Notes (Signed)
[facility]  Provider:  Dr Janann Colonel Referring Provider: Coral Spikes, DO Primary Care Physician:  Thersa Salt, DO  CC:  RLE weakness  [name] returns for follow up of continued weakness in her right foot with a noted foot drop. Since last visit she has had am EMG/NCS, MRI of thoracic spine and has been fitted for a right AFO. MRI thoracic spine was unremarkable.  Notes good improvement in ambulation with addition of AFO. Continues to work with PT though will temporarily stop due to ongoing medical concerns with her dialysis. No acute concerns at this time.   EMG/NCS results: This is of a marked abnormal study, there is evidence of active  neuropathic changes involving bilateral lumbar sacral myotomes,  especially right L4, L5, S1, mild to moderate chronic neuropathic changes involving left side, consistent with bilateral  lumbosacral polyradiculopathies. Differentiation diagnosis  includes lumbar spondylitic polyradiculopathy vs. ischemic  radiculopathies.     Review of Systems: Out of a complete 14 system review, the patient complains of only the following symptoms, and all other reviewed systems are negative. Positive for numbness weakness restless leg  History   Social History  . Marital Status: Legally Separated    Spouse Name: N/A    Number of Children: 3  . Years of Education: 13   Occupational History  . retired    Social History Main Topics  . Smoking status: Current Every Day Smoker -- 0.10 packs/day for 50 years    Types: Cigarettes  . Smokeless tobacco: Never Used     Comment: pt states she smokes "1" cig per day  . Alcohol Use: No  . Drug Use: No     Comment: No hx of IV drug use  . Sexual Activity: No   Other Topics Concern  . Not on file   Social History Narrative   Lives in [place] with Daughter, granddaughters (twins) and mentally challenged niece.   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   Patient has a college  education.   Patient drinks one cup of coffee every 1-2 days.             Family History  Problem Relation Age of Onset  . Thyroid cancer Mother   . Heart disease Father   . Hypertension Father   . Heart attack Father   . Breast cancer Sister   . Lupus Daughter     Past Medical History  Diagnosis Date  . Hyperlipidemia   . Hypertension   . Normal cardiac stress test 12/24/2009    lexiscan, imaging normal  . 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 Diverticulitis   . IBS (irritable bowel syndrome)   . Thyroid disease   . Depression   . Chronic kidney disease     Hemo MWF  . GERD (gastroesophageal reflux disease)   . H/O hiatal hernia   . Arthritis   . RLS (restless legs syndrome)   . Tubular adenoma of colon 01/2008  . Seizures     2004  . Constipation   . Sinus complaint   . Dialysis patient     kidney    Past Surgical History  Procedure Laterality Date  . Cardiac catheterization  2003    normal  . Cholecystectomy      Open mid line incision  . Frontal craniotomy  2002    indication = sinusitis  . Appendectomy    .  Tubal ligation    . Abdominal hysterectomy    . Insertion of dialysis catheter      Left  . Revison of arteriovenous fistula  05/11/2012    Procedure: REVISON OF ARTERIOVENOUS FISTULA;  Surgeon: Elam Dutch, MD;  Location: [facility];  Service: Vascular;  Laterality: Right;  . Av fistula placement Left 11/11/2012    Procedure: INSERTION OF ARTERIOVENOUS (AV) GORE-TEX GRAFT ARM;  Surgeon: Angelia Mould, MD;  Location: [facility];  Service: Vascular;  Laterality: Left;  . Avgg removal Left 11/18/2012    Procedure: REMOVAL OF LEFT UPPER ARM ARTERIOVENOUS GORETEX GRAFT ([place]);  Surgeon: Angelia Mould, MD;  Location: [facility];  Service: Vascular;  Laterality: Left;  . Patch angioplasty Left 11/18/2012    Procedure: PATCH ANGIOPLASTY;  Surgeon: Angelia Mould, MD;  Location: [facility];  Service: Vascular;   Laterality: Left;  . Frontal cranialcity  2002    Current Outpatient Prescriptions  Medication Sig Dispense Refill  . acetaminophen (TYLENOL) 500 MG tablet Take 500 mg by mouth every 6 (six) hours as needed. For pain      . allopurinol (ZYLOPRIM) 100 MG tablet Only take on days following dialysis (Tues, Thurs, Sat).  30 tablet  6  . ALPRAZolam (XANAX) 0.25 MG tablet Take 1 tablet (0.25 mg total) by mouth as needed for anxiety.  2 tablet  0  . amLODipine (NORVASC) 10 MG tablet Take 1 tablet (10 mg total) by mouth daily.  90 tablet  3  . ARIPiprazole (ABILIFY) 2 MG tablet Take 0.5 tablets (1 mg total) by mouth daily.  90 tablet  3  . aspirin EC 81 MG tablet Take 81 mg by mouth every morning.      Marland Kitchen atorvastatin (LIPITOR) 40 MG tablet TAKE ONE TABLET BY MOUTH IN THE MORNING  30 tablet  6  . Calcium Acetate 667 MG TABS       . Calcium Carbonate Antacid (TUMS PO) Take 1 tablet by mouth daily as needed. For stomach upset      . cloNIDine (CATAPRES) 0.1 MG tablet Take 1 tablet (0.1 mg total) by mouth 3 (three) times daily.  90 tablet  3  . cyclobenzaprine (FLEXERIL) 5 MG tablet TAKE ONE TABLET BY MOUTH THREE TIMES DAILY AS NEEDED FOR MUSCLE SPASM  30 tablet  0  . Dexlansoprazole 30 MG capsule Take 1 capsule (30 mg total) by mouth daily.  30 capsule  3  . fluticasone (FLONASE) 50 MCG/ACT nasal spray Place 2 sprays into the nose daily.  16 g  6  . folic acid-vitamin b complex-vitamin c-selenium-zinc (DIALYVITE) 3 MG TABS Take 1 tablet by mouth daily.      Marland Kitchen lidocaine-prilocaine (EMLA) cream Apply 1 application topically 3 (three) times a week. Use before dialysis on MWF      . loratadine (CLARITIN) 10 MG tablet Take 1 tablet (10 mg total) by mouth daily.  30 tablet  6  . metoprolol succinate (TOPROL-XL) 100 MG 24 hr tablet Take 100 mg by mouth every evening. Take with or immediately following a meal.      . oxyCODONE (ROXICODONE) 5 MG immediate release tablet Take 1 tablet (5 mg total) by mouth every 6  (six) hours as needed for pain.  30 tablet  0  . oxyCODONE-acetaminophen (ROXICET) 5-325 MG per tablet Take 1-2 tablets by mouth every 4 (four) hours as needed for pain.  20 tablet  0  . polyethylene glycol powder (GLYCOLAX/MIRALAX) powder Take 17  g by mouth daily as needed. For constipation      . pregabalin (LYRICA) 50 MG capsule Take one 50mg  capsule daily. Add additional 50mg  capsule 3 times per week after dialysis  60 capsule  3  . senna (SENOKOT) 8.6 MG tablet Take 2 tablets by mouth daily as needed. For constipation      . sevelamer carbonate (RENVELA) 800 MG tablet Take 800 mg by mouth 3 (three) times daily with meals.       No current facility-administered medications for this visit.    Allergies as of 07/11/2013 - Review Complete 07/11/2013  Allergen Reaction Noted  . Codeine Nausea And Vomiting 03/01/2012  . Tuberculin tests Hives 06/28/2012  . Penicillins Rash 06/18/2005  . Sulfamethoxazole Rash 06/18/2005    Vitals: BP 154/67  Pulse 78  Ht 5' 2.5" (1.588 m)  Wt 174 lb (78.926 kg)  BMI 31.30 kg/m2 Last Weight:  Wt Readings from Last 1 Encounters:  07/11/13 174 lb (78.926 kg)   Last Height:   Ht Readings from Last 1 Encounters:  07/11/13 5' 2.5" (1.588 m)    Physical exam:  Exam:  Gen: NAD, conversant  Eyes: anicteric sclerae, moist conjunctivae  HENT: Atraumatic  Neck: Trachea midline; supple,  Lungs: CTA, no wheezing, rales, rhonic  CV: RRR, no MRG  Abdomen: Soft, non-tender;  Extremities: No peripheral edema  Skin: Normal temperature, no rash,  Psych: Appropriate affect, pleasant  Neuro:  Brianna: AA&Ox3, appropriately interactive, normal affect   Speech: fluent w/o paraphasic error, intact naming of high and low frequency items  Memory: good recent and remote recall   CN:  PERRL, EOMI no nystagmus, no ptosis, sensation intact to LT V1-V3 bilat, face symmetric, no weakness, hearing grossly intact, palate elevates symmetrically, shoulder shrug 5/5 bilat,   tongue protrudes midline, no fasiculations noted.  Motor: noted some atrophy in L forearm compared to right  Strength:  5/5 In upper extremities except for 5-/5 in L triceps, weakness in proximal right lower extremity and a distal foot drop on the right Coord: rapid alternating and point-to-point (FNF, HTS) movements intact.  Reflexes: diminished but symmetrical, bilat downgoing toes  Sens: LT intact in all extremities, touch to the L forearm anterior/posterior surface results in paresthesias/pain. Decreased LT, PP and temp to lateral surface of RLE from little toe to knee level  Gait: posture, stance, stride and arm-swing normal  Assessment:  After physical and neurologic examination, review of laboratory studies, imaging, neurophysiology testing and pre-existing records, assessment will be reviewed on the problem list.  Plan:  Treatment plan and additional workup will be reviewed under Problem List.  1)RLE weakness  72y/o woman presenting for followup visit with concerns of a new onset right foot drop. Getting good benefit from addition of AFO. Unclear etiology of new onset foot drop. Patient has multiple risk factors, would consider possible ischemic process. [facility] Will place referral, continue AFO and PT. Follow up as needed.

## 2013-07-12 ENCOUNTER — Ambulatory Visit: Payer: Medicare Other | Admitting: Physical Therapy

## 2013-07-14 ENCOUNTER — Ambulatory Visit: Payer: Medicare Other | Admitting: Physical Therapy

## 2013-07-15 ENCOUNTER — Ambulatory Visit: Payer: Medicare Other | Admitting: Physical Therapy

## 2013-07-19 ENCOUNTER — Ambulatory Visit: Payer: Medicare Other | Admitting: Physical Therapy

## 2013-07-21 ENCOUNTER — Ambulatory Visit: Payer: Medicare Other | Admitting: Physical Therapy

## 2013-07-21 DIAGNOSIS — E785 Hyperlipidemia, unspecified: Secondary | ICD-10-CM | POA: Diagnosis not present

## 2013-07-21 DIAGNOSIS — I10 Essential (primary) hypertension: Secondary | ICD-10-CM | POA: Diagnosis not present

## 2013-07-21 DIAGNOSIS — N186 End stage renal disease: Secondary | ICD-10-CM | POA: Diagnosis not present

## 2013-07-21 DIAGNOSIS — I871 Compression of vein: Secondary | ICD-10-CM | POA: Diagnosis not present

## 2013-07-22 ENCOUNTER — Ambulatory Visit: Payer: Medicare Other | Admitting: Physical Therapy

## 2013-07-25 ENCOUNTER — Ambulatory Visit: Payer: Medicare Other | Admitting: Family Medicine

## 2013-07-26 ENCOUNTER — Ambulatory Visit: Payer: Medicare Other | Admitting: Physical Therapy

## 2013-07-30 DIAGNOSIS — N186 End stage renal disease: Secondary | ICD-10-CM | POA: Diagnosis not present

## 2013-08-01 ENCOUNTER — Telehealth: Payer: Self-pay | Admitting: Neurology

## 2013-08-01 DIAGNOSIS — N186 End stage renal disease: Secondary | ICD-10-CM | POA: Diagnosis not present

## 2013-08-01 DIAGNOSIS — D631 Anemia in chronic kidney disease: Secondary | ICD-10-CM | POA: Diagnosis not present

## 2013-08-01 DIAGNOSIS — N2581 Secondary hyperparathyroidism of renal origin: Secondary | ICD-10-CM | POA: Diagnosis not present

## 2013-08-01 DIAGNOSIS — N039 Chronic nephritic syndrome with unspecified morphologic changes: Secondary | ICD-10-CM | POA: Diagnosis not present

## 2013-08-01 NOTE — Telephone Encounter (Signed)
Received notification from [facility] that they will not see patient for 2nd opinion due to "having nothing further to add".

## 2013-08-04 DIAGNOSIS — Z1231 Encounter for screening mammogram for malignant neoplasm of breast: Secondary | ICD-10-CM | POA: Diagnosis not present

## 2013-08-11 DIAGNOSIS — R928 Other abnormal and inconclusive findings on diagnostic imaging of breast: Secondary | ICD-10-CM | POA: Diagnosis not present

## 2013-08-12 ENCOUNTER — Telehealth: Payer: Self-pay | Admitting: *Deleted

## 2013-08-12 NOTE — Telephone Encounter (Signed)
LMOVM for pt to return call.  Please find out if she received the flu shot this year.  If so, when and where.  If not and she is interested please schedule a nurse visit for her to receive. Delton Stelle, Salome Spotted

## 2013-08-16 ENCOUNTER — Ambulatory Visit: Payer: Self-pay | Admitting: Physician Assistant

## 2013-08-16 DIAGNOSIS — Z4901 Encounter for fitting and adjustment of extracorporeal dialysis catheter: Secondary | ICD-10-CM | POA: Diagnosis not present

## 2013-08-16 DIAGNOSIS — Z992 Dependence on renal dialysis: Secondary | ICD-10-CM | POA: Diagnosis not present

## 2013-08-16 DIAGNOSIS — Z9889 Other specified postprocedural states: Secondary | ICD-10-CM | POA: Diagnosis not present

## 2013-08-16 DIAGNOSIS — E7800 Pure hypercholesterolemia, unspecified: Secondary | ICD-10-CM | POA: Diagnosis not present

## 2013-08-16 DIAGNOSIS — N186 End stage renal disease: Secondary | ICD-10-CM | POA: Diagnosis not present

## 2013-08-16 DIAGNOSIS — I120 Hypertensive chronic kidney disease with stage 5 chronic kidney disease or end stage renal disease: Secondary | ICD-10-CM | POA: Diagnosis not present

## 2013-08-16 DIAGNOSIS — Z79899 Other long term (current) drug therapy: Secondary | ICD-10-CM | POA: Diagnosis not present

## 2013-08-16 DIAGNOSIS — Z7982 Long term (current) use of aspirin: Secondary | ICD-10-CM | POA: Diagnosis not present

## 2013-08-16 DIAGNOSIS — I871 Compression of vein: Secondary | ICD-10-CM | POA: Diagnosis not present

## 2013-08-30 DIAGNOSIS — N186 End stage renal disease: Secondary | ICD-10-CM | POA: Diagnosis not present

## 2013-08-31 DIAGNOSIS — D631 Anemia in chronic kidney disease: Secondary | ICD-10-CM | POA: Diagnosis not present

## 2013-08-31 DIAGNOSIS — D509 Iron deficiency anemia, unspecified: Secondary | ICD-10-CM | POA: Diagnosis not present

## 2013-08-31 DIAGNOSIS — N186 End stage renal disease: Secondary | ICD-10-CM | POA: Diagnosis not present

## 2013-08-31 DIAGNOSIS — N2581 Secondary hyperparathyroidism of renal origin: Secondary | ICD-10-CM | POA: Diagnosis not present

## 2013-09-02 NOTE — Telephone Encounter (Signed)
Closing encounter

## 2013-09-17 ENCOUNTER — Other Ambulatory Visit: Payer: Self-pay | Admitting: Family Medicine

## 2013-09-29 DIAGNOSIS — N186 End stage renal disease: Secondary | ICD-10-CM | POA: Diagnosis not present

## 2013-09-30 DIAGNOSIS — N186 End stage renal disease: Secondary | ICD-10-CM | POA: Diagnosis not present

## 2013-09-30 DIAGNOSIS — D509 Iron deficiency anemia, unspecified: Secondary | ICD-10-CM | POA: Diagnosis not present

## 2013-09-30 DIAGNOSIS — D631 Anemia in chronic kidney disease: Secondary | ICD-10-CM | POA: Diagnosis not present

## 2013-09-30 DIAGNOSIS — N039 Chronic nephritic syndrome with unspecified morphologic changes: Secondary | ICD-10-CM | POA: Diagnosis not present

## 2013-09-30 DIAGNOSIS — N2581 Secondary hyperparathyroidism of renal origin: Secondary | ICD-10-CM | POA: Diagnosis not present

## 2013-10-02 ENCOUNTER — Other Ambulatory Visit: Payer: Self-pay | Admitting: Family Medicine

## 2013-10-07 ENCOUNTER — Other Ambulatory Visit: Payer: Self-pay | Admitting: Family Medicine

## 2013-10-07 MED ORDER — ATORVASTATIN CALCIUM 40 MG PO TABS: 40.0000 mg | ORAL_TABLET | Freq: Every day | ORAL | Status: DC

## 2013-10-11 DIAGNOSIS — N186 End stage renal disease: Secondary | ICD-10-CM | POA: Diagnosis not present

## 2013-10-11 DIAGNOSIS — Z992 Dependence on renal dialysis: Secondary | ICD-10-CM | POA: Diagnosis not present

## 2013-10-17 ENCOUNTER — Telehealth: Payer: Self-pay | Admitting: Family Medicine

## 2013-10-17 DIAGNOSIS — R1310 Dysphagia, unspecified: Secondary | ICD-10-CM

## 2013-10-17 NOTE — Telephone Encounter (Signed)
Pt called and wants a referral to Dr. Oletta Lamas who is a gastrologist.jw

## 2013-10-19 ENCOUNTER — Other Ambulatory Visit: Payer: Self-pay | Admitting: Family Medicine

## 2013-10-19 NOTE — Telephone Encounter (Signed)
Referral placed.

## 2013-10-20 ENCOUNTER — Ambulatory Visit: Payer: Medicare Other | Admitting: Family Medicine

## 2013-10-26 ENCOUNTER — Encounter: Payer: Self-pay | Admitting: Family Medicine

## 2013-10-27 DIAGNOSIS — I120 Hypertensive chronic kidney disease with stage 5 chronic kidney disease or end stage renal disease: Secondary | ICD-10-CM | POA: Diagnosis not present

## 2013-10-27 DIAGNOSIS — N186 End stage renal disease: Secondary | ICD-10-CM | POA: Diagnosis not present

## 2013-10-27 DIAGNOSIS — E279 Disorder of adrenal gland, unspecified: Secondary | ICD-10-CM | POA: Diagnosis not present

## 2013-10-27 DIAGNOSIS — D3500 Benign neoplasm of unspecified adrenal gland: Secondary | ICD-10-CM | POA: Diagnosis not present

## 2013-10-30 DIAGNOSIS — N186 End stage renal disease: Secondary | ICD-10-CM | POA: Diagnosis not present

## 2013-10-31 DIAGNOSIS — N039 Chronic nephritic syndrome with unspecified morphologic changes: Secondary | ICD-10-CM | POA: Diagnosis not present

## 2013-10-31 DIAGNOSIS — E8779 Other fluid overload: Secondary | ICD-10-CM | POA: Diagnosis not present

## 2013-10-31 DIAGNOSIS — D509 Iron deficiency anemia, unspecified: Secondary | ICD-10-CM | POA: Diagnosis not present

## 2013-10-31 DIAGNOSIS — N2581 Secondary hyperparathyroidism of renal origin: Secondary | ICD-10-CM | POA: Diagnosis not present

## 2013-10-31 DIAGNOSIS — D631 Anemia in chronic kidney disease: Secondary | ICD-10-CM | POA: Diagnosis not present

## 2013-10-31 DIAGNOSIS — N186 End stage renal disease: Secondary | ICD-10-CM | POA: Diagnosis not present

## 2013-11-03 DIAGNOSIS — D3500 Benign neoplasm of unspecified adrenal gland: Secondary | ICD-10-CM | POA: Diagnosis not present

## 2013-11-08 ENCOUNTER — Ambulatory Visit (INDEPENDENT_AMBULATORY_CARE_PROVIDER_SITE_OTHER): Payer: Medicare Other | Admitting: Gastroenterology

## 2013-11-08 ENCOUNTER — Encounter: Payer: Self-pay | Admitting: Gastroenterology

## 2013-11-08 VITALS — BP 180/68 | HR 74 | Ht 62.0 in | Wt 186.0 lb

## 2013-11-08 DIAGNOSIS — R159 Full incontinence of feces: Secondary | ICD-10-CM

## 2013-11-08 NOTE — Progress Notes (Signed)
    History of Present Illness: This is a 73 year old female who relates worsening fecal incontinence over the past 6-8 months. She developed a right foot drop several months ago as well. Her last colonoscopy was in 2009 by Dr. Oletta Lamas.  Current Medications, Allergies, Past Medical History, Past Surgical History, Family History and Social History were reviewed in [employer] record.  Physical Exam: General: Well developed , well nourished, no acute distress Head: Normocephalic and atraumatic Eyes:  sclerae anicteric, EOMI Ears: Normal auditory acuity Mouth: No deformity or lesions Lungs: Clear throughout to auscultation Heart: Regular rate and rhythm; no murmurs, rubs or bruits Abdomen: Soft, non tender and non distended. No masses, hepatosplenomegaly or hernias noted. Normal Bowel sounds Rectal: Markedly decreased anal sphincter, with no anal squeeze. No lesions, Hemoccult-negative brown stool in the vault Musculoskeletal: Symmetrical with no gross deformities  Pulses:  Normal pulses noted Extremities: No clubbing, cyanosis, edema or deformities noted Neurological: Alert oriented x 4, grossly nonfocal Psychological:  Alert and cooperative. Normal mood and affect  Assessment and Recommendations:  1. Fecal incontinence. Markedly decreased sphincter and no anal squeeze. Rule out neuropathic process. Return to neurology as soon as possible.  She is advised to sit on the commode 4-5 times a day to hopefully evacuate before episodes of incontinence. Discontinue laxatives.   2. Chronic GERD with a large hiatal hernia. Standard antireflux measures and continue dexlansoprazole 60 mg daily.   3. Gastroparesis. Continue standard dietary measures. This problem likely exacerbates her chronic GERD.   4. Personal history of adenomatous colon polyps. Defer followup examination until neurologic problems are fully evaluated.  5. Diverticulosis.   6. ESRD on hemodialysis.

## 2013-11-08 NOTE — Patient Instructions (Signed)
Please follow up with your Neurologist.   Thank you for choosing me and [facility].  Pricilla Riffle. Dagoberto Ligas., MD., Marval Regal  cc: Thersa Salt, MD

## 2013-11-09 ENCOUNTER — Telehealth: Payer: Self-pay | Admitting: *Deleted

## 2013-11-09 NOTE — Telephone Encounter (Signed)
Diane from [facility] called to request NPI number.  NPI number given x 3 visits.  Derl Barrow, RN

## 2013-11-10 ENCOUNTER — Encounter: Payer: Self-pay | Admitting: Neurology

## 2013-11-10 ENCOUNTER — Telehealth: Payer: Self-pay | Admitting: Neurology

## 2013-11-10 ENCOUNTER — Other Ambulatory Visit: Payer: Medicare Other

## 2013-11-10 ENCOUNTER — Ambulatory Visit
Admission: RE | Admit: 2013-11-10 | Discharge: 2013-11-10 | Disposition: A | Discharge status: Home / Self Care | Payer: Medicare Other | Source: Ambulatory Visit | Attending: Neurology | Admitting: Neurology

## 2013-11-10 ENCOUNTER — Ambulatory Visit (INDEPENDENT_AMBULATORY_CARE_PROVIDER_SITE_OTHER): Payer: Medicare Other | Admitting: Neurology

## 2013-11-10 VITALS — BP 159/71 | HR 74 | Ht 62.5 in | Wt 188.0 lb

## 2013-11-10 DIAGNOSIS — R5383 Other fatigue: Secondary | ICD-10-CM | POA: Diagnosis not present

## 2013-11-10 DIAGNOSIS — R159 Full incontinence of feces: Secondary | ICD-10-CM | POA: Diagnosis not present

## 2013-11-10 DIAGNOSIS — M5126 Other intervertebral disc displacement, lumbar region: Secondary | ICD-10-CM | POA: Diagnosis not present

## 2013-11-10 DIAGNOSIS — M216X9 Other acquired deformities of unspecified foot: Secondary | ICD-10-CM | POA: Diagnosis not present

## 2013-11-10 DIAGNOSIS — M545 Low back pain, unspecified: Secondary | ICD-10-CM | POA: Diagnosis not present

## 2013-11-10 DIAGNOSIS — R5381 Other malaise: Secondary | ICD-10-CM | POA: Diagnosis not present

## 2013-11-10 DIAGNOSIS — R531 Weakness: Secondary | ICD-10-CM

## 2013-11-10 DIAGNOSIS — M21371 Foot drop, right foot: Secondary | ICD-10-CM

## 2013-11-10 DIAGNOSIS — M47817 Spondylosis without myelopathy or radiculopathy, lumbosacral region: Secondary | ICD-10-CM | POA: Diagnosis not present

## 2013-11-10 MED ORDER — ALPRAZOLAM 0.25 MG PO TABS: 0.2500 mg | ORAL_TABLET | Freq: Once | ORAL | Status: DC

## 2013-11-10 NOTE — Progress Notes (Deleted)
2

## 2013-11-10 NOTE — Progress Notes (Signed)
[facility]  Provider:  Dr Janann Colonel Referring Provider: Donetta Potts, MD Primary Care Physician:  Thersa Salt, DO  CC:  Bowel incontinence  States intermittent episodes of bowel incontinence for the past few months but recently has started to occur more frequently, in the past few days it is occuring daily. Has no warning, is unaware until after the BM happens. Reports a chronic history of urinary incontinence (ongoing for years) but more lately occuring more frequently, again comes without warning. Notes some paresthesias, pain/burning sensation in the distal lower extremities. She denies any saddle anesthesia. No recent back trauma, no falls. Notes some mild lower back pain.   Ms Stormes returns for follow up of continued weakness in her right foot with a noted foot drop. Since last visit she has had am EMG/NCS, MRI of thoracic spine and has been fitted for a right AFO. MRI thoracic spine was unremarkable.  Notes good improvement in ambulation with addition of AFO. Continues to work with PT though will temporarily stop due to ongoing medical concerns with her dialysis. No acute concerns at this time.   EMG/NCS results: This is of a marked abnormal study, there is evidence of active  neuropathic changes involving bilateral lumbar sacral myotomes,  especially right L4, L5, S1, mild to moderate chronic neuropathic changes involving left side, consistent with bilateral  lumbosacral polyradiculopathies. Differentiation diagnosis  includes lumbar spondylitic polyradiculopathy vs. ischemic  radiculopathies.     Review of Systems: Out of a complete 14 system review, the patient complains of only the following symptoms, and all other reviewed systems are negative. Positive for numbness weakness restless leg  History   Social History  . Marital Status: Legally Separated    Spouse Name: N/A    Number of Children: 3  . Years of Education: 13   Occupational History   . retired    Social History Main Topics  . Smoking status: Former Smoker -- 0.10 packs/day for 50 years    Types: Cigarettes    Quit date: 11/08/2013  . Smokeless tobacco: Never Used     Comment: pt states she smokes "1" cig per day  . Alcohol Use: No  . Drug Use: No     Comment: No hx of IV drug use  . Sexual Activity: No   Other Topics Concern  . Not on file   Social History Narrative   Lives in [place] with Daughter, granddaughters (twins) and mentally challenged niece.   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   Patient has a college education.   Patient drinks one cup of caffeine - once a week.                Family History  Problem Relation Age of Onset  . Thyroid cancer Mother   . Heart disease Father   . Hypertension Father   . Heart attack Father   . Breast cancer Sister   . Lupus Daughter     Past Medical History  Diagnosis Date  . Hyperlipidemia   . Hypertension   . Normal cardiac stress test 12/24/2009    lexiscan, imaging normal  . 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 Diverticulitis   . IBS (irritable bowel syndrome)   . Thyroid disease   . Depression   . Chronic kidney disease     Hemo MWF  . GERD (gastroesophageal reflux disease)   . H/O hiatal hernia   .  Arthritis   . RLS (restless legs syndrome)   . Tubular adenoma of colon 01/2008  . Seizures     2004  . Constipation   . Sinus complaint   . Dialysis patient     kidney    Past Surgical History  Procedure Laterality Date  . Cardiac catheterization  2003    normal  . Cholecystectomy      Open mid line incision  . Frontal craniotomy  2002    indication = sinusitis  . Appendectomy    . Tubal ligation    . Abdominal hysterectomy    . Insertion of dialysis catheter      Left  . Revison of arteriovenous fistula  05/11/2012    Procedure: REVISON OF ARTERIOVENOUS FISTULA;  Surgeon: Elam Dutch, MD;  Location: [facility];   Service: Vascular;  Laterality: Right;  . Av fistula placement Left 11/11/2012    Procedure: INSERTION OF ARTERIOVENOUS (AV) GORE-TEX GRAFT ARM;  Surgeon: Angelia Mould, MD;  Location: [facility];  Service: Vascular;  Laterality: Left;  . Avgg removal Left 11/18/2012    Procedure: REMOVAL OF LEFT UPPER ARM ARTERIOVENOUS GORETEX GRAFT ([place]);  Surgeon: Angelia Mould, MD;  Location: [facility];  Service: Vascular;  Laterality: Left;  . Patch angioplasty Left 11/18/2012    Procedure: PATCH ANGIOPLASTY;  Surgeon: Angelia Mould, MD;  Location: [facility];  Service: Vascular;  Laterality: Left;  . Frontal cranialcity  2002    Current Outpatient Prescriptions  Medication Sig Dispense Refill  . allopurinol (ZYLOPRIM) 100 MG tablet ONLY TAKE ON DAYS FOLLOWING DIALYSIS (TUESDAY, THURSDAY, AND SATURDAY)  30 tablet  0  . amLODipine (NORVASC) 10 MG tablet TAKE ONE TABLET BY MOUTH ONCE DAILY  90 tablet  0  . ARIPiprazole (ABILIFY) 2 MG tablet Take 0.5 tablets (1 mg total) by mouth daily.  90 tablet  3  . aspirin EC 81 MG tablet Take 81 mg by mouth every morning.      Marland Kitchen atorvastatin (LIPITOR) 40 MG tablet Take 1 tablet (40 mg total) by mouth daily.  90 tablet  3  . Calcium Acetate 667 MG TABS       . Calcium Carbonate Antacid (TUMS PO) Take 1 tablet by mouth daily as needed. For stomach upset      . cloNIDine (CATAPRES) 0.1 MG tablet Take 1 tablet (0.1 mg total) by mouth 3 (three) times daily.  90 tablet  3  . cyclobenzaprine (FLEXERIL) 5 MG tablet TAKE ONE TABLET BY MOUTH THREE TIMES DAILY AS NEEDED FOR MUSCLE SPASM  30 tablet  0  . Dexlansoprazole 30 MG capsule Take 1 capsule (30 mg total) by mouth daily.  30 capsule  3  . fluticasone (FLONASE) 50 MCG/ACT nasal spray Place 2 sprays into the nose daily.  16 g  6  . folic acid-vitamin b complex-vitamin c-selenium-zinc (DIALYVITE) 3 MG TABS Take 1 tablet by mouth daily.      Marland Kitchen ibuprofen (ADVIL,MOTRIN) 200 MG tablet Take 200 mg by mouth every 6 (six)  hours as needed.      . lidocaine-prilocaine (EMLA) cream Apply 1 application topically 3 (three) times a week. Use before dialysis on MWF      . loratadine (CLARITIN) 10 MG tablet Take 1 tablet (10 mg total) by mouth daily.  30 tablet  6  . metoprolol succinate (TOPROL-XL) 100 MG 24 hr tablet Take 100 mg by mouth every evening. Take with or immediately following a meal.      .  polyethylene glycol powder (GLYCOLAX/MIRALAX) powder Take 17 g by mouth daily as needed. For constipation      . pregabalin (LYRICA) 50 MG capsule Take one 50mg  capsule daily. Add additional 50mg  capsule 3 times per week after dialysis  60 capsule  3  . senna (SENOKOT) 8.6 MG tablet Take 2 tablets by mouth daily as needed. For constipation      . SENSIPAR 30 MG tablet 1 tablet daily.      . sevelamer carbonate (RENVELA) 800 MG tablet Take 800 mg by mouth 3 (three) times daily with meals.       No current facility-administered medications for this visit.    Allergies as of 11/10/2013 - Review Complete 11/10/2013  Allergen Reaction Noted  . Codeine Nausea And Vomiting 03/01/2012  . Tuberculin tests Hives 06/28/2012  . Penicillins Rash 06/18/2005  . Sulfamethoxazole Rash 06/18/2005    Vitals: BP 159/71  Pulse 74  Ht 5' 2.5" (1.588 m)  Wt 188 lb (85.276 kg)  BMI 33.82 kg/m2 Last Weight:  Wt Readings from Last 1 Encounters:  11/10/13 188 lb (85.276 kg)   Last Height:   Ht Readings from Last 1 Encounters:  11/10/13 5' 2.5" (1.588 m)    Physical exam:  Exam:  Gen: NAD, conversant  Eyes: anicteric sclerae, moist conjunctivae  HENT: Atraumatic  Neck: Trachea midline; supple,  Lungs: CTA, no wheezing, rales, rhonic  CV: RRR, no MRG  Abdomen: Soft, non-tender;  Extremities: No peripheral edema  Skin: Normal temperature, no rash,  Psych: Appropriate affect, pleasant  Neuro:  MS: AA&Ox3, appropriately interactive, normal affect   Speech: fluent w/o paraphasic error, intact naming of high and low frequency  items  Memory: good recent and remote recall   CN:  PERRL, EOMI no nystagmus, no ptosis, sensation intact to LT V1-V3 bilat, face symmetric, no weakness, hearing grossly intact, palate elevates symmetrically, shoulder shrug 5/5 bilat,  tongue protrudes midline, no fasiculations noted.  Motor: noted some atrophy in L forearm compared to right  Strength:  5/5 In upper extremities except for 5-/5 in L triceps, weakness in proximal right lower extremity and a distal foot drop on the right Coord: rapid alternating and point-to-point (FNF, HTS) movements intact.  Reflexes: diminished but symmetrical, bilat downgoing toes  Sens: LT intact in all extremities, touch to the L forearm anterior/posterior surface results in paresthesias/pain. Decreased LT, PP and temp to lateral surface of RLE from little toe to knee level. Mild decreased LT in saddle distribution. Per request, rectal exam deferred  Gait: posture, stance, stride and arm-swing normal  Assessment:  After physical and neurologic examination, review of laboratory studies, imaging, neurophysiology testing and pre-existing records, assessment will be reviewed on the problem list.  Plan:  Treatment plan and additional workup will be reviewed under Problem List.  1)Bowel incontinence 2)R foot drop  73y/o woman presenting for followup visit with concerns of bowel incontinence. Reports a chronic intermittent history of this but recently has started occuring on a daily basis. Physical exam shows some mild saddle anesthesia but otherwise unremarkable. Will order urgent MRI L spine and follow up once imaging completed. Continue to use AFO for foot drop.

## 2013-11-11 ENCOUNTER — Other Ambulatory Visit: Payer: Self-pay | Admitting: Neurology

## 2013-11-11 ENCOUNTER — Telehealth: Payer: Self-pay | Admitting: Neurology

## 2013-11-11 DIAGNOSIS — M545 Low back pain, unspecified: Secondary | ICD-10-CM

## 2013-11-11 NOTE — Telephone Encounter (Signed)
done

## 2013-11-11 NOTE — Telephone Encounter (Signed)
Granddaughter Maceo Pro calling for patient-would like Dr Janann Colonel to call patient regarding referral to another physician based on results

## 2013-11-11 NOTE — Progress Notes (Signed)
Please let her know the MRI showed some worsening of her degenerative changes and I would like the orthopedic doctor to evaluate her and see if this worsening is causing her symptoms.

## 2013-11-11 NOTE — Progress Notes (Signed)
Quick Note:  Spoke with patient and informed her that an order was placed for her to see an orthopedic doctor, patient was questioning what was the results of her MRI, informed patient that she will receive a call from [place] for scheduling. ______

## 2013-11-14 ENCOUNTER — Emergency Department (HOSPITAL_COMMUNITY): Payer: Medicare Other

## 2013-11-14 ENCOUNTER — Telehealth: Payer: Self-pay | Admitting: Neurology

## 2013-11-14 ENCOUNTER — Emergency Department (HOSPITAL_COMMUNITY)
Admission: EM | Admit: 2013-11-14 | Discharge: 2013-11-14 | Disposition: A | Discharge status: Home / Self Care | Payer: Medicare Other | Attending: Emergency Medicine | Admitting: Emergency Medicine

## 2013-11-14 ENCOUNTER — Encounter (HOSPITAL_COMMUNITY): Payer: Self-pay | Admitting: Emergency Medicine

## 2013-11-14 DIAGNOSIS — R5383 Other fatigue: Secondary | ICD-10-CM

## 2013-11-14 DIAGNOSIS — I10 Essential (primary) hypertension: Secondary | ICD-10-CM | POA: Diagnosis not present

## 2013-11-14 DIAGNOSIS — IMO0002 Reserved for concepts with insufficient information to code with codable children: Secondary | ICD-10-CM | POA: Diagnosis not present

## 2013-11-14 DIAGNOSIS — Z79899 Other long term (current) drug therapy: Secondary | ICD-10-CM | POA: Diagnosis not present

## 2013-11-14 DIAGNOSIS — Z880 Allergy status to penicillin: Secondary | ICD-10-CM | POA: Insufficient documentation

## 2013-11-14 DIAGNOSIS — Z8639 Personal history of other endocrine, nutritional and metabolic disease: Secondary | ICD-10-CM | POA: Diagnosis not present

## 2013-11-14 DIAGNOSIS — G9389 Other specified disorders of brain: Secondary | ICD-10-CM | POA: Diagnosis not present

## 2013-11-14 DIAGNOSIS — Z862 Personal history of diseases of the blood and blood-forming organs and certain disorders involving the immune mechanism: Secondary | ICD-10-CM | POA: Diagnosis not present

## 2013-11-14 DIAGNOSIS — E7800 Pure hypercholesterolemia, unspecified: Secondary | ICD-10-CM | POA: Diagnosis not present

## 2013-11-14 DIAGNOSIS — Z87448 Personal history of other diseases of urinary system: Secondary | ICD-10-CM | POA: Insufficient documentation

## 2013-11-14 DIAGNOSIS — Z87891 Personal history of nicotine dependence: Secondary | ICD-10-CM | POA: Diagnosis not present

## 2013-11-14 DIAGNOSIS — R5381 Other malaise: Secondary | ICD-10-CM | POA: Diagnosis not present

## 2013-11-14 DIAGNOSIS — E875 Hyperkalemia: Secondary | ICD-10-CM

## 2013-11-14 DIAGNOSIS — Z7982 Long term (current) use of aspirin: Secondary | ICD-10-CM | POA: Diagnosis not present

## 2013-11-14 HISTORY — DX: Other specified disorders of adrenal gland: E27.8

## 2013-11-14 HISTORY — DX: Pure hypercholesterolemia, unspecified: E78.00

## 2013-11-14 HISTORY — DX: Disorder of kidney and ureter, unspecified: N28.9

## 2013-11-14 HISTORY — DX: Dorsalgia, unspecified: M54.9

## 2013-11-14 LAB — COMPREHENSIVE METABOLIC PANEL
ALBUMIN: 3 g/dL — AB (ref 3.5–5.2)
ALT: 12 U/L (ref 0–35)
AST: 16 U/L (ref 0–37)
Alkaline Phosphatase: 71 U/L (ref 39–117)
BUN: 52 mg/dL — AB (ref 6–23)
CALCIUM: 9.5 mg/dL (ref 8.4–10.5)
CO2: 24 mEq/L (ref 19–32)
CREATININE: 10.45 mg/dL — AB (ref 0.50–1.10)
Chloride: 99 mEq/L (ref 96–112)
GFR calc Af Amer: 4 mL/min — ABNORMAL LOW (ref >90)
GFR calc non Af Amer: 3 mL/min — ABNORMAL LOW (ref >90)
Glucose, Bld: 107 mg/dL — ABNORMAL HIGH (ref 70–99)
Potassium: 6.2 mEq/L — ABNORMAL HIGH (ref 3.7–5.3)
Sodium: 137 mEq/L (ref 137–147)
TOTAL PROTEIN: 7.2 g/dL (ref 6.0–8.3)
Total Bilirubin: 0.2 mg/dL — ABNORMAL LOW (ref 0.3–1.2)

## 2013-11-14 LAB — URINALYSIS, ROUTINE W REFLEX MICROSCOPIC
Bilirubin Urine: NEGATIVE
Glucose, UA: NEGATIVE mg/dL
Hgb urine dipstick: NEGATIVE
Ketones, ur: NEGATIVE mg/dL
LEUKOCYTES UA: NEGATIVE
NITRITE: NEGATIVE
PH: 7.5 (ref 5.0–8.0)
Protein, ur: 100 mg/dL — AB
SPECIFIC GRAVITY, URINE: 1.012 (ref 1.005–1.030)
Urobilinogen, UA: 0.2 mg/dL (ref 0.0–1.0)

## 2013-11-14 LAB — DIFFERENTIAL
BASOS ABS: 0 10*3/uL (ref 0.0–0.1)
Basophils Relative: 0 % (ref 0–1)
EOS ABS: 0.4 10*3/uL (ref 0.0–0.7)
Eosinophils Relative: 5 % (ref 0–5)
Lymphocytes Relative: 24 % (ref 12–46)
Lymphs Abs: 1.6 10*3/uL (ref 0.7–4.0)
MONO ABS: 0.6 10*3/uL (ref 0.1–1.0)
MONOS PCT: 9 % (ref 3–12)
NEUTROS ABS: 4.1 10*3/uL (ref 1.7–7.7)
Neutrophils Relative %: 62 % (ref 43–77)

## 2013-11-14 LAB — URINE MICROSCOPIC-ADD ON

## 2013-11-14 LAB — I-STAT TROPONIN, ED: TROPONIN I, POC: 0.01 ng/mL (ref 0.00–0.08)

## 2013-11-14 LAB — CBC
HEMATOCRIT: 34 % — AB (ref 36.0–46.0)
HEMOGLOBIN: 10.7 g/dL — AB (ref 12.0–15.0)
MCH: 28.6 pg (ref 26.0–34.0)
MCHC: 31.5 g/dL (ref 30.0–36.0)
MCV: 90.9 fL (ref 78.0–100.0)
Platelets: 141 10*3/uL — ABNORMAL LOW (ref 150–400)
RBC: 3.74 MIL/uL — ABNORMAL LOW (ref 3.87–5.11)
RDW: 16 % — AB (ref 11.5–15.5)
WBC: 6.6 10*3/uL (ref 4.0–10.5)

## 2013-11-14 LAB — PROTIME-INR
INR: 1.14 (ref 0.00–1.49)
Prothrombin Time: 14.4 seconds (ref 11.6–15.2)

## 2013-11-14 LAB — APTT: APTT: 33 s (ref 24–37)

## 2013-11-14 MED ORDER — SODIUM POLYSTYRENE SULFONATE 15 GM/60ML PO SUSP
30.0000 g | Freq: Once | ORAL | Status: AC
  Administered 2013-11-14: 30 g via ORAL
  Filled 2013-11-14: qty 120

## 2013-11-14 NOTE — Telephone Encounter (Signed)
Pt is returning [name]'s call states she also wants Dr. Janann Colonel to call her back concerning the results of the MRI. She also states she does not have another # and wanted to go over that but her main reason is the results. Thanks

## 2013-11-14 NOTE — Discharge Instructions (Signed)
Followup for dialysis, tomorrow or Wednesday. Make sure you are drinking enough fluid. Return here, if needed, for problems.    Fatigue Fatigue is a feeling of tiredness, lack of energy, lack of motivation, or feeling tired all the time. Having enough rest, good nutrition, and reducing stress will normally reduce fatigue. Consult your caregiver if it persists. The nature of your fatigue will help your caregiver to find out its cause. The treatment is based on the cause.  CAUSES  There are many causes for fatigue. Most of the time, fatigue can be traced to one or more of your habits or routines. Most causes fit into one or more of three general areas. They are: Lifestyle problems  Sleep disturbances.  Overwork.  Physical exertion.  Unhealthy habits.  Poor eating habits or eating disorders.  Alcohol and/or drug use .  Lack of proper nutrition (malnutrition). Psychological problems  Stress and/or anxiety problems.  Depression.  Grief.  Boredom. Medical Problems or Conditions  Anemia.  Pregnancy.  Thyroid gland problems.  Recovery from major surgery.  Continuous pain.  Emphysema or asthma that is not well controlled  Allergic conditions.  Diabetes.  Infections (such as mononucleosis).  Obesity.  Sleep disorders, such as sleep apnea.  Heart failure or other heart-related problems.  Cancer.  Kidney disease.  Liver disease.  Effects of certain medicines such as antihistamines, cough and cold remedies, prescription pain medicines, heart and blood pressure medicines, drugs used for treatment of cancer, and some antidepressants. SYMPTOMS  The symptoms of fatigue include:   Lack of energy.  Lack of drive (motivation).  Drowsiness.  Feeling of indifference to the surroundings. DIAGNOSIS  The details of how you feel help guide your caregiver in finding out what is causing the fatigue. You will be asked about your present and past health condition. It  is important to review all medicines that you take, including prescription and non-prescription items. A thorough exam will be done. You will be questioned about your feelings, habits, and normal lifestyle. Your caregiver may suggest blood tests, urine tests, or other tests to look for common medical causes of fatigue.  TREATMENT  Fatigue is treated by correcting the underlying cause. For example, if you have continuous pain or depression, treating these causes will improve how you feel. Similarly, adjusting the dose of certain medicines will help in reducing fatigue.  HOME CARE INSTRUCTIONS   Try to get the required amount of good sleep every night.  Eat a healthy and nutritious diet, and drink enough water throughout the day.  Practice ways of relaxing (including yoga or meditation).  Exercise regularly.  Make plans to change situations that cause stress. Act on those plans so that stresses decrease over time. Keep your work and personal routine reasonable.  Avoid street drugs and minimize use of alcohol.  Start taking a daily multivitamin after consulting your caregiver. SEEK MEDICAL CARE IF:   You have persistent tiredness, which cannot be accounted for.  You have fever.  You have unintentional weight loss.  You have headaches.  You have disturbed sleep throughout the night.  You are feeling sad.  You have constipation.  You have dry skin.  You have gained weight.  You are taking any new or different medicines that you suspect are causing fatigue.  You are unable to sleep at night.  You develop any unusual swelling of your legs or other parts of your body. SEEK IMMEDIATE MEDICAL CARE IF:   You are feeling confused.  Your  vision is blurred.  You feel faint or pass out.  You develop severe headache.  You develop severe abdominal, pelvic, or back pain.  You develop chest pain, shortness of breath, or an irregular or fast heartbeat.  You are unable to pass a  normal amount of urine.  You develop abnormal bleeding such as bleeding from the rectum or you vomit blood.  You have thoughts about harming yourself or committing suicide.  You are worried that you might harm someone else. MAKE SURE YOU:   Understand these instructions.  Will watch your condition.  Will get help right away if you are not doing well or get worse. Document Released: 03/16/2007 Document Revised: 08/11/2011 Document Reviewed: 03/16/2007 [facility] Patient Information 2014 [place].  Hyperkalemia Hyperkalemia is when you have too much potassium in your blood. This can be a life-threatening condition. Potassium is normally removed (excreted) from the body by the kidneys. CAUSES  The potassium level in your body can become too high for the following reasons:  You take in too much potassium. You can do this by:  Using salt substitutes. They contain large amounts of potassium.  Taking potassium supplements from your caregiver. The dose may be too high for you.  Eating foods or taking nutritional products with potassium.  You excrete too little potassium. This can happen if:  Your kidneys are not functioning properly. Kidney (renal) disease is a very common cause of hyperkalemia.  You are taking medicines that lower your excretion of potassium, such as certain diuretic medicines.  You have an adrenal gland disease called Addison's disease.  You have a urinary tract obstruction, such as kidney stones.  You are on treatment to mechanically clean your blood (dialysis) and you skip a treatment.  You release a high amount of potassium from your cells into your blood. You may have a condition that causes potassium to move from your cells to your bloodstream. This can happen with:  Injury to muscles or other tissues. Most potassium is stored in the muscles.  Severe burns or infections.  Acidic blood plasma (acidosis). Acidosis can result from many diseases, such  as uncontrolled diabetes. SYMPTOMS  Usually, there are no symptoms unless the potassium is dangerously high or has risen very quickly. Symptoms may include:  Irregular or very slow heartbeat.  Feeling sick to your stomach (nauseous).  Tiredness (fatigue).  Nerve problems such as tingling of the skin, numbness of the hands or feet, weakness, or paralysis. DIAGNOSIS  A simple blood test can measure the amount of potassium in your body. An electrocardiogram test of the heart can also help make the diagnosis. The heart may beat dangerously fast or slow down and stop beating with severe hyperkalemia.  TREATMENT  Treatment depends on how bad the condition is and on the underlying cause.  If the hyperkalemia is an emergency (causing heart problems or paralysis), many different medicines can be used alone or together to lower the potassium level briefly. This may include an insulin injection even if you are not diabetic. Emergency dialysis may be needed to remove potassium from the body.  If the hyperkalemia is less severe or dangerous, the underlying cause is treated. This can include taking medicines if needed. Your prescription medicines may be changed. You may also need to take a medicine to help your body get rid of potassium. You may need to eat a diet low in potassium. HOME CARE INSTRUCTIONS   Take medicines and supplements as directed by your caregiver.  Do not take any over-the-counter medicines, supplements, natural products, herbs, or vitamins without reviewing them with your caregiver. Certain supplements and natural food products can have high amounts of potassium. Other products (such as ibuprofen) can damage weak kidneys and raise your potassium.  You may be asked to do repeat lab tests. Be sure to follow these directions.  If you have kidney disease, you may need to follow a low potassium diet. SEEK MEDICAL CARE IF:   You notice an irregular or very slow heartbeat.  You feel  lightheaded.  You develop weakness that is unusual for you. SEEK IMMEDIATE MEDICAL CARE IF:   You have shortness of breath.  You have chest discomfort.  You pass out (faint). MAKE SURE YOU:   Understand these instructions.  Will watch your condition.  Will get help right away if you are not doing well or get worse. Document Released: 05/09/2002 Document Revised: 08/11/2011 Document Reviewed: 10/24/2010 [facility] Patient Information 2014 [place], [place].

## 2013-11-14 NOTE — ED Notes (Signed)
Patient transported to MRI 

## 2013-11-14 NOTE — ED Notes (Signed)
Did in and out cath on patient yellow urine in return

## 2013-11-14 NOTE — ED Notes (Signed)
Patient sts that she woke upyesterday and felt funny and today has weakness in all 4 extremities and sts feels like she cant stay awake.

## 2013-11-14 NOTE — Telephone Encounter (Signed)
Left message regarding the referral to an orthopedic surgeon based on MRI lumbar results.  Told her return call with another number so her results can be relayed, we have tried to return message twice.

## 2013-11-14 NOTE — ED Provider Notes (Signed)
CSN: [id number]     Arrival date & time 11/14/13  1330 History   First MD Initiated Contact with Patient 11/14/13 1515     Chief Complaint  Patient presents with  . Extremity Weakness  . Fatigue     (Consider location/radiation/quality/duration/timing/severity/associated sxs/prior Treatment) HPI  [name] is a 73 y.o. female who feels confused, and weak today. Her weakness is bilateral hands and feet. She has similar sensation. 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 She denies recent fever, chills, nausea, vomiting, weakness, or dizziness. She skipped dialysis today. She's not had any chest pain or cough. There are no other known modifying factors.   Past Medical History  Diagnosis Date  . Renal disorder   . Adrenal mass   . Hypertension   . High cholesterol   . Back pain    No past surgical history on file. No family history on file. History  Substance Use Topics  . Smoking status: Former Research scientist (life sciences)  . Smokeless tobacco: Not on file  . Alcohol Use: No   OB History   Grav Para Term Preterm Abortions TAB SAB Ect Mult Living                 Review of Systems  All other systems reviewed and are negative.     Allergies  Codeine and Penicillins  Home Medications   Prior to Admission medications   Medication Sig Start Date End Date Taking? Authorizing Provider  allopurinol (ZYLOPRIM) 100 MG tablet Take 100 mg by mouth 4 (four) times a week. On non-dialysis days Tuesday, Thursday, Saturday, and Sunday   Yes Historical Provider, MD  amLODipine (NORVASC) 10 MG tablet Take 10 mg by mouth at bedtime.   Yes Historical Provider, MD  ARIPiprazole (ABILIFY) 2 MG tablet Take 1 mg by mouth every morning.   Yes Historical Provider, MD  aspirin EC 81 MG tablet Take 81 mg by mouth every morning.   Yes Historical Provider, MD  atorvastatin (LIPITOR) 20 MG tablet Take 20 mg by mouth every morning.   Yes Historical Provider, MD  cinacalcet (SENSIPAR) 30 MG tablet Take 30 mg by mouth at  bedtime.   Yes Historical Provider, MD  cloNIDine (CATAPRES) 0.2 MG tablet Take 0.2 mg by mouth 2 (two) times daily.   Yes Historical Provider, MD  fluticasone (FLONASE) 50 MCG/ACT nasal spray Place 1 spray into both nostrils daily as needed for allergies or rhinitis.   Yes Historical Provider, MD  folic acid-vitamin b complex-vitamin c-selenium-zinc (DIALYVITE) 3 MG TABS tablet Take 1 tablet by mouth daily.   Yes Historical Provider, MD  loratadine (CLARITIN) 10 MG tablet Take 10 mg by mouth daily as needed for allergies.   Yes Historical Provider, MD  metoprolol succinate (TOPROL-XL) 100 MG 24 hr tablet Take 100 mg by mouth at bedtime. Take with or immediately following a meal.   Yes Historical Provider, MD  nicotine (NICODERM CQ - DOSED IN MG/24 HOURS) 21 mg/24hr patch Place 21 mg onto the skin daily.   Yes Historical Provider, MD  omeprazole (PRILOSEC) 20 MG capsule Take 20 mg by mouth 2 (two) times daily.   Yes Historical Provider, MD  pregabalin (LYRICA) 75 MG capsule Take 75 mg by mouth 2 (two) times daily.   Yes Historical Provider, MD  sevelamer carbonate (RENVELA) 800 MG tablet Take 2,400 mg by mouth 3 (three) times daily with meals.   Yes Historical Provider, MD   BP 167/72  Pulse 69  Temp(Src) 97.4 F (36.3  C) (Oral)  Resp 13  Ht 5\' 1"  (1.549 m)  Wt 186 lb (84.369 kg)  BMI 35.16 kg/m2  SpO2 98% Physical Exam  Nursing note and vitals reviewed. Constitutional: She is oriented to person, place, and time. She appears well-developed and well-nourished.  HENT:  Head: Normocephalic and atraumatic.  Eyes: Conjunctivae and EOM are normal. Pupils are equal, round, and reactive to light.  Neck: Normal range of motion and phonation normal. Neck supple.  Cardiovascular: Normal rate, regular rhythm and intact distal pulses.   Graft upper arm has normal pulsations  Pulmonary/Chest: Effort normal and breath sounds normal. She exhibits no tenderness.  Abdominal: Soft. She exhibits no  distension. There is no tenderness. There is no guarding.  Musculoskeletal: Normal range of motion.  Neurological: She is alert and oriented to person, place, and time. No cranial nerve deficit. She exhibits normal muscle tone. Coordination normal.  No dysarthria, aphasia or nystagmus.  Skin: Skin is warm and dry.  Psychiatric: She has a normal mood and affect. Her behavior is normal. Judgment and thought content normal.    ED Course  Procedures (including critical care time)  Medications  sodium polystyrene (KAYEXALATE) 15 GM/60ML suspension 30 g (30 g Oral Given 11/14/13 1725)    Patient Vitals for the past 24 hrs:  BP Temp Temp src Pulse Resp SpO2 Height Weight  11/14/13 1845 167/72 mmHg - - 69 13 98 % - -  11/14/13 1730 166/62 mmHg - - 68 18 99 % - -  11/14/13 1719 161/72 mmHg 97.4 F (36.3 C) - 69 18 97 % - -  11/14/13 1341 128/51 mmHg 97.8 F (36.6 C) Oral 65 18 95 % 5\' 1"  (1.549 m) 186 lb (84.369 kg)    8:05 PM Reevaluation with update and discussion. After initial assessment and treatment, an updated evaluation reveals she is able to ambulate easily. She is tolerating oral liquids. She  feels better. Findings discussed with patient and all questions answered. Brittinee Risk L    Labs Review Labs Reviewed  CBC - Abnormal; Notable for the following:    RBC 3.74 (*)    Hemoglobin 10.7 (*)    HCT 34.0 (*)    RDW 16.0 (*)    Platelets 141 (*)    All other components within normal limits  COMPREHENSIVE METABOLIC PANEL - Abnormal; Notable for the following:    Potassium 6.2 (*)    Glucose, Bld 107 (*)    BUN 52 (*)    Creatinine, Ser 10.45 (*)    Albumin 3.0 (*)    Total Bilirubin 0.2 (*)    GFR calc non Af Amer 3 (*)    GFR calc Af Amer 4 (*)    All other components within normal limits  URINALYSIS, ROUTINE W REFLEX MICROSCOPIC - Abnormal; Notable for the following:    Protein, ur 100 (*)    All other components within normal limits  URINE MICROSCOPIC-ADD ON -  Abnormal; Notable for the following:    Squamous Epithelial / LPF MANY (*)    All other components within normal limits  URINE CULTURE  PROTIME-INR  APTT  DIFFERENTIAL  I-STAT TROPOININ, ED    Imaging Review Ct Head (brain) Wo Contrast  11/14/2013   CLINICAL DATA:  Extremity weakness and fatigue  EXAM: CT HEAD WITHOUT CONTRAST  TECHNIQUE: Contiguous axial images were obtained from the base of the skull through the vertex without intravenous contrast.  COMPARISON:  None.  FINDINGS: The ventricles are normal in size  and position. The patient has undergone previous bifrontal craniectomy. There is encephalomalacia in the left paramedian anterior temporal lobe. There is no intracranial hemorrhage nor evidence of acute ischemic change. Minimal changes of chronic small vessel ischemia are present in both cerebral hemispheres. The cerebellum and brainstem are normal.  At bone window settings there is no acute skull fracture nor lytic or blastic lesion. Postsurgical changes in the frontal bones are present.  IMPRESSION: 1. There is no acute intracranial hemorrhagic process nor evidence of acute ischemic change. 2. 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 Correlation with the patient's surgical history is needed.   Electronically Signed   By: David  Martinique   On: 11/14/2013 15:08   Mr Brain Wo Contrast  11/14/2013   CLINICAL DATA:  73 year old female with weakness. Initial encounter.  EXAM: MRI HEAD WITHOUT CONTRAST  TECHNIQUE: Multiplanar, multiecho pulse sequences of the brain and surrounding structures were obtained without intravenous contrast.  COMPARISON:  Head CT 1454 hr the same day. Brain MRI 04/23/2013 and earlier.  FINDINGS: Chronic bifrontal craniotomy changes and left anterior frontal lobe encephalomalacia. Stable cerebral volume.  No restricted diffusion to suggest acute infarction. No midline shift, mass effect, evidence of mass lesion,  ventriculomegaly, extra-axial collection or acute intracranial hemorrhage. Stable pituitary. Cervicomedullary junction within normal limits. Negative visualized cervical spine.  Stable gray and white matter signal throughout the brain.  Major intracranial vascular flow voids are stable.  Stable bone marrow signal. Regressed mastoid effusions. Visible internal auditory structures appear normal. Stable postoperative changes to both orbits. Stable paranasal sinuses. Visualized scalp soft tissues are within normal limits.  IMPRESSION: No acute intracranial abnormality. Stable MRI appearance of the brain.   Electronically Signed   By: Lars Pinks M.D.   On: 11/14/2013 17:12     EKG Interpretation   Date/Time:  Monday November 14 2013 13:37:25 EDT Ventricular Rate:  68 PR Interval:  206 QRS Duration: 72 QT Interval:  412 QTC Calculation: 438 R Axis:   12 Text Interpretation:  Normal sinus rhythm Cannot rule out Anterior infarct  , age undetermined Abnormal ECG No old tracing to compare Confirmed by  [facility]  MD, Rosalin Buster [phone]) on 11/14/2013 3:18:04 PM      MDM   Final diagnoses:  Hyperkalemia  Fatigue    Nonspecific fatigue and weakness. Doubt urinary tract infection, severe metabolic abnormality or serious bacterial infection. She's been treated with Kayexalate here for mild anemia. She is stable for discharge to resume her usual dialysis, tomorrow or the following day.  Nursing Notes Reviewed/ Care Coordinated Applicable Imaging Reviewed Interpretation of Laboratory Data incorporated into ED treatment  The patient appears reasonably screened and/or stabilized for discharge and I doubt any other medical condition or other [facility] requiring further screening, evaluation, or treatment in the ED at this time prior to discharge.  Plan: Home Medications- usual; Home Treatments- rest; return here if the recommended treatment, does not improve the symptoms; Recommended follow up- PCP prn  Richarda Blade, MD 11/14/13 2008

## 2013-11-14 NOTE — Telephone Encounter (Signed)
Returned call. No answer, message left.

## 2013-11-15 LAB — URINE CULTURE
Colony Count: NO GROWTH
Culture: NO GROWTH

## 2013-11-16 ENCOUNTER — Encounter: Payer: Self-pay | Admitting: Neurology

## 2013-11-17 ENCOUNTER — Telehealth: Payer: Self-pay | Admitting: Family Medicine

## 2013-11-17 NOTE — Telephone Encounter (Signed)
Need nicotine patches that insurance will pay for.  Please call patient to discuss

## 2013-11-18 MED ORDER — NICOTINE 21 MG/24HR TD PT24: 21.0000 mg | MEDICATED_PATCH | Freq: Every day | TRANSDERMAL | Status: DC

## 2013-11-18 NOTE — Telephone Encounter (Signed)
Rx sent 

## 2013-11-21 ENCOUNTER — Other Ambulatory Visit: Payer: Self-pay | Admitting: Family Medicine

## 2013-11-29 DIAGNOSIS — N186 End stage renal disease: Secondary | ICD-10-CM | POA: Diagnosis not present

## 2013-11-30 DIAGNOSIS — D631 Anemia in chronic kidney disease: Secondary | ICD-10-CM | POA: Diagnosis not present

## 2013-11-30 DIAGNOSIS — N186 End stage renal disease: Secondary | ICD-10-CM | POA: Diagnosis not present

## 2013-11-30 DIAGNOSIS — Z23 Encounter for immunization: Secondary | ICD-10-CM | POA: Diagnosis not present

## 2013-11-30 DIAGNOSIS — N2581 Secondary hyperparathyroidism of renal origin: Secondary | ICD-10-CM | POA: Diagnosis not present

## 2013-12-05 DIAGNOSIS — M216X9 Other acquired deformities of unspecified foot: Secondary | ICD-10-CM | POA: Diagnosis not present

## 2013-12-05 DIAGNOSIS — M5430 Sciatica, unspecified side: Secondary | ICD-10-CM | POA: Diagnosis not present

## 2013-12-13 DIAGNOSIS — I871 Compression of vein: Secondary | ICD-10-CM | POA: Diagnosis not present

## 2013-12-13 DIAGNOSIS — N186 End stage renal disease: Secondary | ICD-10-CM | POA: Diagnosis not present

## 2013-12-13 DIAGNOSIS — T82898A Other specified complication of vascular prosthetic devices, implants and grafts, initial encounter: Secondary | ICD-10-CM | POA: Diagnosis not present

## 2013-12-27 DIAGNOSIS — T82898A Other specified complication of vascular prosthetic devices, implants and grafts, initial encounter: Secondary | ICD-10-CM | POA: Diagnosis not present

## 2013-12-27 DIAGNOSIS — I10 Essential (primary) hypertension: Secondary | ICD-10-CM | POA: Diagnosis not present

## 2013-12-27 DIAGNOSIS — E785 Hyperlipidemia, unspecified: Secondary | ICD-10-CM | POA: Diagnosis not present

## 2013-12-27 DIAGNOSIS — N186 End stage renal disease: Secondary | ICD-10-CM | POA: Diagnosis not present

## 2013-12-30 DIAGNOSIS — N186 End stage renal disease: Secondary | ICD-10-CM | POA: Diagnosis not present

## 2014-01-02 DIAGNOSIS — N039 Chronic nephritic syndrome with unspecified morphologic changes: Secondary | ICD-10-CM | POA: Diagnosis not present

## 2014-01-02 DIAGNOSIS — N2581 Secondary hyperparathyroidism of renal origin: Secondary | ICD-10-CM | POA: Diagnosis not present

## 2014-01-02 DIAGNOSIS — Z23 Encounter for immunization: Secondary | ICD-10-CM | POA: Diagnosis not present

## 2014-01-02 DIAGNOSIS — N186 End stage renal disease: Secondary | ICD-10-CM | POA: Diagnosis not present

## 2014-01-02 DIAGNOSIS — D631 Anemia in chronic kidney disease: Secondary | ICD-10-CM | POA: Diagnosis not present

## 2014-01-04 DIAGNOSIS — D631 Anemia in chronic kidney disease: Secondary | ICD-10-CM | POA: Diagnosis not present

## 2014-01-04 DIAGNOSIS — Z23 Encounter for immunization: Secondary | ICD-10-CM | POA: Diagnosis not present

## 2014-01-04 DIAGNOSIS — N186 End stage renal disease: Secondary | ICD-10-CM | POA: Diagnosis not present

## 2014-01-04 DIAGNOSIS — N2581 Secondary hyperparathyroidism of renal origin: Secondary | ICD-10-CM | POA: Diagnosis not present

## 2014-01-05 NOTE — Telephone Encounter (Signed)
Noted  

## 2014-01-06 DIAGNOSIS — D631 Anemia in chronic kidney disease: Secondary | ICD-10-CM | POA: Diagnosis not present

## 2014-01-06 DIAGNOSIS — Z23 Encounter for immunization: Secondary | ICD-10-CM | POA: Diagnosis not present

## 2014-01-06 DIAGNOSIS — N186 End stage renal disease: Secondary | ICD-10-CM | POA: Diagnosis not present

## 2014-01-06 DIAGNOSIS — N2581 Secondary hyperparathyroidism of renal origin: Secondary | ICD-10-CM | POA: Diagnosis not present

## 2014-01-09 DIAGNOSIS — D631 Anemia in chronic kidney disease: Secondary | ICD-10-CM | POA: Diagnosis not present

## 2014-01-09 DIAGNOSIS — N2581 Secondary hyperparathyroidism of renal origin: Secondary | ICD-10-CM | POA: Diagnosis not present

## 2014-01-09 DIAGNOSIS — N186 End stage renal disease: Secondary | ICD-10-CM | POA: Diagnosis not present

## 2014-01-09 DIAGNOSIS — Z23 Encounter for immunization: Secondary | ICD-10-CM | POA: Diagnosis not present

## 2014-01-11 DIAGNOSIS — D631 Anemia in chronic kidney disease: Secondary | ICD-10-CM | POA: Diagnosis not present

## 2014-01-11 DIAGNOSIS — N2581 Secondary hyperparathyroidism of renal origin: Secondary | ICD-10-CM | POA: Diagnosis not present

## 2014-01-11 DIAGNOSIS — Z23 Encounter for immunization: Secondary | ICD-10-CM | POA: Diagnosis not present

## 2014-01-11 DIAGNOSIS — N039 Chronic nephritic syndrome with unspecified morphologic changes: Secondary | ICD-10-CM | POA: Diagnosis not present

## 2014-01-11 DIAGNOSIS — N186 End stage renal disease: Secondary | ICD-10-CM | POA: Diagnosis not present

## 2014-01-13 DIAGNOSIS — N186 End stage renal disease: Secondary | ICD-10-CM | POA: Diagnosis not present

## 2014-01-13 DIAGNOSIS — N2581 Secondary hyperparathyroidism of renal origin: Secondary | ICD-10-CM | POA: Diagnosis not present

## 2014-01-13 DIAGNOSIS — Z23 Encounter for immunization: Secondary | ICD-10-CM | POA: Diagnosis not present

## 2014-01-13 DIAGNOSIS — N039 Chronic nephritic syndrome with unspecified morphologic changes: Secondary | ICD-10-CM | POA: Diagnosis not present

## 2014-01-13 DIAGNOSIS — D631 Anemia in chronic kidney disease: Secondary | ICD-10-CM | POA: Diagnosis not present

## 2014-01-16 DIAGNOSIS — D631 Anemia in chronic kidney disease: Secondary | ICD-10-CM | POA: Diagnosis not present

## 2014-01-16 DIAGNOSIS — N2581 Secondary hyperparathyroidism of renal origin: Secondary | ICD-10-CM | POA: Diagnosis not present

## 2014-01-16 DIAGNOSIS — Z23 Encounter for immunization: Secondary | ICD-10-CM | POA: Diagnosis not present

## 2014-01-16 DIAGNOSIS — N186 End stage renal disease: Secondary | ICD-10-CM | POA: Diagnosis not present

## 2014-01-18 DIAGNOSIS — N186 End stage renal disease: Secondary | ICD-10-CM | POA: Diagnosis not present

## 2014-01-18 DIAGNOSIS — Z23 Encounter for immunization: Secondary | ICD-10-CM | POA: Diagnosis not present

## 2014-01-18 DIAGNOSIS — N2581 Secondary hyperparathyroidism of renal origin: Secondary | ICD-10-CM | POA: Diagnosis not present

## 2014-01-18 DIAGNOSIS — D631 Anemia in chronic kidney disease: Secondary | ICD-10-CM | POA: Diagnosis not present

## 2014-01-19 ENCOUNTER — Other Ambulatory Visit: Payer: Self-pay | Admitting: Home Health Services

## 2014-01-19 DIAGNOSIS — N186 End stage renal disease: Secondary | ICD-10-CM

## 2014-01-20 DIAGNOSIS — N186 End stage renal disease: Secondary | ICD-10-CM | POA: Diagnosis not present

## 2014-01-20 DIAGNOSIS — Z23 Encounter for immunization: Secondary | ICD-10-CM | POA: Diagnosis not present

## 2014-01-20 DIAGNOSIS — D631 Anemia in chronic kidney disease: Secondary | ICD-10-CM | POA: Diagnosis not present

## 2014-01-20 DIAGNOSIS — N2581 Secondary hyperparathyroidism of renal origin: Secondary | ICD-10-CM | POA: Diagnosis not present

## 2014-01-23 ENCOUNTER — Other Ambulatory Visit: Payer: Self-pay | Admitting: Family Medicine

## 2014-01-23 DIAGNOSIS — D631 Anemia in chronic kidney disease: Secondary | ICD-10-CM | POA: Diagnosis not present

## 2014-01-23 DIAGNOSIS — N039 Chronic nephritic syndrome with unspecified morphologic changes: Secondary | ICD-10-CM | POA: Diagnosis not present

## 2014-01-23 DIAGNOSIS — Z23 Encounter for immunization: Secondary | ICD-10-CM | POA: Diagnosis not present

## 2014-01-23 DIAGNOSIS — N186 End stage renal disease: Secondary | ICD-10-CM | POA: Diagnosis not present

## 2014-01-23 DIAGNOSIS — N2581 Secondary hyperparathyroidism of renal origin: Secondary | ICD-10-CM | POA: Diagnosis not present

## 2014-01-24 DIAGNOSIS — N186 End stage renal disease: Secondary | ICD-10-CM | POA: Diagnosis not present

## 2014-01-24 DIAGNOSIS — T82898A Other specified complication of vascular prosthetic devices, implants and grafts, initial encounter: Secondary | ICD-10-CM | POA: Diagnosis not present

## 2014-01-25 DIAGNOSIS — N2581 Secondary hyperparathyroidism of renal origin: Secondary | ICD-10-CM | POA: Diagnosis not present

## 2014-01-25 DIAGNOSIS — Z23 Encounter for immunization: Secondary | ICD-10-CM | POA: Diagnosis not present

## 2014-01-25 DIAGNOSIS — N186 End stage renal disease: Secondary | ICD-10-CM | POA: Diagnosis not present

## 2014-01-25 DIAGNOSIS — D631 Anemia in chronic kidney disease: Secondary | ICD-10-CM | POA: Diagnosis not present

## 2014-01-27 DIAGNOSIS — N2581 Secondary hyperparathyroidism of renal origin: Secondary | ICD-10-CM | POA: Diagnosis not present

## 2014-01-27 DIAGNOSIS — N186 End stage renal disease: Secondary | ICD-10-CM | POA: Diagnosis not present

## 2014-01-27 DIAGNOSIS — D631 Anemia in chronic kidney disease: Secondary | ICD-10-CM | POA: Diagnosis not present

## 2014-01-27 DIAGNOSIS — Z23 Encounter for immunization: Secondary | ICD-10-CM | POA: Diagnosis not present

## 2014-01-27 DIAGNOSIS — N039 Chronic nephritic syndrome with unspecified morphologic changes: Secondary | ICD-10-CM | POA: Diagnosis not present

## 2014-01-30 DIAGNOSIS — N2581 Secondary hyperparathyroidism of renal origin: Secondary | ICD-10-CM | POA: Diagnosis not present

## 2014-01-30 DIAGNOSIS — Z23 Encounter for immunization: Secondary | ICD-10-CM | POA: Diagnosis not present

## 2014-01-30 DIAGNOSIS — N186 End stage renal disease: Secondary | ICD-10-CM | POA: Diagnosis not present

## 2014-01-30 DIAGNOSIS — D631 Anemia in chronic kidney disease: Secondary | ICD-10-CM | POA: Diagnosis not present

## 2014-02-01 DIAGNOSIS — N2581 Secondary hyperparathyroidism of renal origin: Secondary | ICD-10-CM | POA: Diagnosis not present

## 2014-02-01 DIAGNOSIS — D631 Anemia in chronic kidney disease: Secondary | ICD-10-CM | POA: Diagnosis not present

## 2014-02-01 DIAGNOSIS — N186 End stage renal disease: Secondary | ICD-10-CM | POA: Diagnosis not present

## 2014-02-03 DIAGNOSIS — N186 End stage renal disease: Secondary | ICD-10-CM | POA: Diagnosis not present

## 2014-02-03 DIAGNOSIS — N2581 Secondary hyperparathyroidism of renal origin: Secondary | ICD-10-CM | POA: Diagnosis not present

## 2014-02-03 DIAGNOSIS — N039 Chronic nephritic syndrome with unspecified morphologic changes: Secondary | ICD-10-CM | POA: Diagnosis not present

## 2014-02-03 DIAGNOSIS — D631 Anemia in chronic kidney disease: Secondary | ICD-10-CM | POA: Diagnosis not present

## 2014-02-06 DIAGNOSIS — N2581 Secondary hyperparathyroidism of renal origin: Secondary | ICD-10-CM | POA: Diagnosis not present

## 2014-02-06 DIAGNOSIS — N186 End stage renal disease: Secondary | ICD-10-CM | POA: Diagnosis not present

## 2014-02-06 DIAGNOSIS — D631 Anemia in chronic kidney disease: Secondary | ICD-10-CM | POA: Diagnosis not present

## 2014-02-08 DIAGNOSIS — N2581 Secondary hyperparathyroidism of renal origin: Secondary | ICD-10-CM | POA: Diagnosis not present

## 2014-02-08 DIAGNOSIS — N039 Chronic nephritic syndrome with unspecified morphologic changes: Secondary | ICD-10-CM | POA: Diagnosis not present

## 2014-02-08 DIAGNOSIS — N186 End stage renal disease: Secondary | ICD-10-CM | POA: Diagnosis not present

## 2014-02-08 DIAGNOSIS — D631 Anemia in chronic kidney disease: Secondary | ICD-10-CM | POA: Diagnosis not present

## 2014-02-11 DIAGNOSIS — N2581 Secondary hyperparathyroidism of renal origin: Secondary | ICD-10-CM | POA: Diagnosis not present

## 2014-02-11 DIAGNOSIS — D631 Anemia in chronic kidney disease: Secondary | ICD-10-CM | POA: Diagnosis not present

## 2014-02-11 DIAGNOSIS — N186 End stage renal disease: Secondary | ICD-10-CM | POA: Diagnosis not present

## 2014-02-13 DIAGNOSIS — N2581 Secondary hyperparathyroidism of renal origin: Secondary | ICD-10-CM | POA: Diagnosis not present

## 2014-02-13 DIAGNOSIS — N186 End stage renal disease: Secondary | ICD-10-CM | POA: Diagnosis not present

## 2014-02-13 DIAGNOSIS — N039 Chronic nephritic syndrome with unspecified morphologic changes: Secondary | ICD-10-CM | POA: Diagnosis not present

## 2014-02-13 DIAGNOSIS — D631 Anemia in chronic kidney disease: Secondary | ICD-10-CM | POA: Diagnosis not present

## 2014-02-15 DIAGNOSIS — N186 End stage renal disease: Secondary | ICD-10-CM | POA: Diagnosis not present

## 2014-02-15 DIAGNOSIS — N2581 Secondary hyperparathyroidism of renal origin: Secondary | ICD-10-CM | POA: Diagnosis not present

## 2014-02-15 DIAGNOSIS — D631 Anemia in chronic kidney disease: Secondary | ICD-10-CM | POA: Diagnosis not present

## 2014-02-17 DIAGNOSIS — N186 End stage renal disease: Secondary | ICD-10-CM | POA: Diagnosis not present

## 2014-02-17 DIAGNOSIS — N2581 Secondary hyperparathyroidism of renal origin: Secondary | ICD-10-CM | POA: Diagnosis not present

## 2014-02-17 DIAGNOSIS — D631 Anemia in chronic kidney disease: Secondary | ICD-10-CM | POA: Diagnosis not present

## 2014-02-19 ENCOUNTER — Other Ambulatory Visit: Payer: Self-pay | Admitting: Family Medicine

## 2014-02-20 DIAGNOSIS — N186 End stage renal disease: Secondary | ICD-10-CM | POA: Diagnosis not present

## 2014-02-20 DIAGNOSIS — N2581 Secondary hyperparathyroidism of renal origin: Secondary | ICD-10-CM | POA: Diagnosis not present

## 2014-02-20 DIAGNOSIS — D631 Anemia in chronic kidney disease: Secondary | ICD-10-CM | POA: Diagnosis not present

## 2014-02-22 DIAGNOSIS — N186 End stage renal disease: Secondary | ICD-10-CM | POA: Diagnosis not present

## 2014-02-22 DIAGNOSIS — D631 Anemia in chronic kidney disease: Secondary | ICD-10-CM | POA: Diagnosis not present

## 2014-02-22 DIAGNOSIS — N2581 Secondary hyperparathyroidism of renal origin: Secondary | ICD-10-CM | POA: Diagnosis not present

## 2014-02-24 DIAGNOSIS — N186 End stage renal disease: Secondary | ICD-10-CM | POA: Diagnosis not present

## 2014-02-24 DIAGNOSIS — N039 Chronic nephritic syndrome with unspecified morphologic changes: Secondary | ICD-10-CM | POA: Diagnosis not present

## 2014-02-24 DIAGNOSIS — N2581 Secondary hyperparathyroidism of renal origin: Secondary | ICD-10-CM | POA: Diagnosis not present

## 2014-02-24 DIAGNOSIS — D631 Anemia in chronic kidney disease: Secondary | ICD-10-CM | POA: Diagnosis not present

## 2014-02-27 DIAGNOSIS — N186 End stage renal disease: Secondary | ICD-10-CM | POA: Diagnosis not present

## 2014-02-27 DIAGNOSIS — D631 Anemia in chronic kidney disease: Secondary | ICD-10-CM | POA: Diagnosis not present

## 2014-02-27 DIAGNOSIS — N2581 Secondary hyperparathyroidism of renal origin: Secondary | ICD-10-CM | POA: Diagnosis not present

## 2014-02-27 DIAGNOSIS — N039 Chronic nephritic syndrome with unspecified morphologic changes: Secondary | ICD-10-CM | POA: Diagnosis not present

## 2014-03-01 DIAGNOSIS — N186 End stage renal disease: Secondary | ICD-10-CM | POA: Diagnosis not present

## 2014-03-01 DIAGNOSIS — D631 Anemia in chronic kidney disease: Secondary | ICD-10-CM | POA: Diagnosis not present

## 2014-03-01 DIAGNOSIS — N039 Chronic nephritic syndrome with unspecified morphologic changes: Secondary | ICD-10-CM | POA: Diagnosis not present

## 2014-03-01 DIAGNOSIS — N2581 Secondary hyperparathyroidism of renal origin: Secondary | ICD-10-CM | POA: Diagnosis not present

## 2014-03-03 DIAGNOSIS — N2581 Secondary hyperparathyroidism of renal origin: Secondary | ICD-10-CM | POA: Diagnosis not present

## 2014-03-03 DIAGNOSIS — N186 End stage renal disease: Secondary | ICD-10-CM | POA: Diagnosis not present

## 2014-03-03 DIAGNOSIS — D631 Anemia in chronic kidney disease: Secondary | ICD-10-CM | POA: Diagnosis not present

## 2014-03-12 ENCOUNTER — Encounter (HOSPITAL_COMMUNITY): Payer: Self-pay | Admitting: Emergency Medicine

## 2014-03-12 ENCOUNTER — Emergency Department (HOSPITAL_COMMUNITY): Payer: Medicare Other

## 2014-03-12 ENCOUNTER — Inpatient Hospital Stay (HOSPITAL_COMMUNITY)
Admission: EM | Admit: 2014-03-12 | Discharge: 2014-03-14 | DRG: 189 | Disposition: A | Discharge status: Home / Self Care | Payer: Medicare Other | Attending: Internal Medicine | Admitting: Internal Medicine

## 2014-03-12 DIAGNOSIS — J984 Other disorders of lung: Secondary | ICD-10-CM | POA: Diagnosis not present

## 2014-03-12 DIAGNOSIS — R7611 Nonspecific reaction to tuberculin skin test without active tuberculosis: Secondary | ICD-10-CM

## 2014-03-12 DIAGNOSIS — G2581 Restless legs syndrome: Secondary | ICD-10-CM | POA: Diagnosis present

## 2014-03-12 DIAGNOSIS — J302 Other seasonal allergic rhinitis: Secondary | ICD-10-CM | POA: Diagnosis present

## 2014-03-12 DIAGNOSIS — K3184 Gastroparesis: Secondary | ICD-10-CM

## 2014-03-12 DIAGNOSIS — M109 Gout, unspecified: Secondary | ICD-10-CM | POA: Diagnosis present

## 2014-03-12 DIAGNOSIS — Z760 Encounter for issue of repeat prescription: Secondary | ICD-10-CM

## 2014-03-12 DIAGNOSIS — E785 Hyperlipidemia, unspecified: Secondary | ICD-10-CM | POA: Diagnosis present

## 2014-03-12 DIAGNOSIS — E8770 Fluid overload, unspecified: Secondary | ICD-10-CM | POA: Diagnosis not present

## 2014-03-12 DIAGNOSIS — Z7982 Long term (current) use of aspirin: Secondary | ICD-10-CM | POA: Diagnosis not present

## 2014-03-12 DIAGNOSIS — E7800 Pure hypercholesterolemia, unspecified: Secondary | ICD-10-CM

## 2014-03-12 DIAGNOSIS — E875 Hyperkalemia: Secondary | ICD-10-CM | POA: Diagnosis not present

## 2014-03-12 DIAGNOSIS — D649 Anemia, unspecified: Secondary | ICD-10-CM | POA: Diagnosis present

## 2014-03-12 DIAGNOSIS — L309 Dermatitis, unspecified: Secondary | ICD-10-CM

## 2014-03-12 DIAGNOSIS — M159 Polyosteoarthritis, unspecified: Secondary | ICD-10-CM

## 2014-03-12 DIAGNOSIS — Z79899 Other long term (current) drug therapy: Secondary | ICD-10-CM | POA: Diagnosis not present

## 2014-03-12 DIAGNOSIS — F411 Generalized anxiety disorder: Secondary | ICD-10-CM

## 2014-03-12 DIAGNOSIS — Z9229 Personal history of other drug therapy: Secondary | ICD-10-CM

## 2014-03-12 DIAGNOSIS — Z992 Dependence on renal dialysis: Secondary | ICD-10-CM | POA: Diagnosis not present

## 2014-03-12 DIAGNOSIS — N186 End stage renal disease: Secondary | ICD-10-CM | POA: Diagnosis not present

## 2014-03-12 DIAGNOSIS — R5381 Other malaise: Secondary | ICD-10-CM

## 2014-03-12 DIAGNOSIS — M545 Low back pain: Secondary | ICD-10-CM | POA: Diagnosis present

## 2014-03-12 DIAGNOSIS — J9600 Acute respiratory failure, unspecified whether with hypoxia or hypercapnia: Secondary | ICD-10-CM | POA: Diagnosis present

## 2014-03-12 DIAGNOSIS — J9601 Acute respiratory failure with hypoxia: Principal | ICD-10-CM | POA: Diagnosis present

## 2014-03-12 DIAGNOSIS — Z803 Family history of malignant neoplasm of breast: Secondary | ICD-10-CM | POA: Diagnosis not present

## 2014-03-12 DIAGNOSIS — F329 Major depressive disorder, single episode, unspecified: Secondary | ICD-10-CM | POA: Diagnosis present

## 2014-03-12 DIAGNOSIS — R0602 Shortness of breath: Secondary | ICD-10-CM | POA: Diagnosis not present

## 2014-03-12 DIAGNOSIS — I120 Hypertensive chronic kidney disease with stage 5 chronic kidney disease or end stage renal disease: Secondary | ICD-10-CM | POA: Diagnosis present

## 2014-03-12 DIAGNOSIS — M25559 Pain in unspecified hip: Secondary | ICD-10-CM

## 2014-03-12 DIAGNOSIS — R739 Hyperglycemia, unspecified: Secondary | ICD-10-CM | POA: Diagnosis present

## 2014-03-12 DIAGNOSIS — Z808 Family history of malignant neoplasm of other organs or systems: Secondary | ICD-10-CM | POA: Diagnosis not present

## 2014-03-12 DIAGNOSIS — K219 Gastro-esophageal reflux disease without esophagitis: Secondary | ICD-10-CM | POA: Diagnosis present

## 2014-03-12 DIAGNOSIS — J811 Chronic pulmonary edema: Secondary | ICD-10-CM | POA: Diagnosis present

## 2014-03-12 DIAGNOSIS — Z8249 Family history of ischemic heart disease and other diseases of the circulatory system: Secondary | ICD-10-CM

## 2014-03-12 DIAGNOSIS — F17210 Nicotine dependence, cigarettes, uncomplicated: Secondary | ICD-10-CM | POA: Diagnosis present

## 2014-03-12 DIAGNOSIS — R0689 Other abnormalities of breathing: Secondary | ICD-10-CM | POA: Diagnosis not present

## 2014-03-12 DIAGNOSIS — M7918 Myalgia, other site: Secondary | ICD-10-CM

## 2014-03-12 DIAGNOSIS — N2581 Secondary hyperparathyroidism of renal origin: Secondary | ICD-10-CM | POA: Diagnosis present

## 2014-03-12 DIAGNOSIS — F17200 Nicotine dependence, unspecified, uncomplicated: Secondary | ICD-10-CM

## 2014-03-12 DIAGNOSIS — R443 Hallucinations, unspecified: Secondary | ICD-10-CM

## 2014-03-12 DIAGNOSIS — R069 Unspecified abnormalities of breathing: Secondary | ICD-10-CM | POA: Diagnosis not present

## 2014-03-12 DIAGNOSIS — E079 Disorder of thyroid, unspecified: Secondary | ICD-10-CM | POA: Diagnosis present

## 2014-03-12 DIAGNOSIS — F419 Anxiety disorder, unspecified: Secondary | ICD-10-CM | POA: Diagnosis present

## 2014-03-12 DIAGNOSIS — I10 Essential (primary) hypertension: Secondary | ICD-10-CM

## 2014-03-12 DIAGNOSIS — M79604 Pain in right leg: Secondary | ICD-10-CM

## 2014-03-12 DIAGNOSIS — J182 Hypostatic pneumonia, unspecified organism: Secondary | ICD-10-CM | POA: Diagnosis not present

## 2014-03-12 LAB — I-STAT ARTERIAL BLOOD GAS, ED
ACID-BASE DEFICIT: 3 mmol/L — AB (ref 0.0–2.0)
Bicarbonate: 22.2 mEq/L (ref 20.0–24.0)
O2 Saturation: 87 %
TCO2: 23 mmol/L (ref 0–100)
pCO2 arterial: 40.5 mmHg (ref 35.0–45.0)
pH, Arterial: 7.348 — ABNORMAL LOW (ref 7.350–7.450)
pO2, Arterial: 55 mmHg — ABNORMAL LOW (ref 80.0–100.0)

## 2014-03-12 LAB — CBC WITH DIFFERENTIAL/PLATELET
BASOS PCT: 0 % (ref 0–1)
Basophils Absolute: 0.1 10*3/uL (ref 0.0–0.1)
Basophils Absolute: 0 10*3/uL (ref 0.0–0.1)
Basophils Relative: 1 % (ref 0–1)
EOS PCT: 4 % (ref 0–5)
Eosinophils Absolute: 0.5 10*3/uL (ref 0.0–0.7)
Eosinophils Absolute: 0.3 10*3/uL (ref 0.0–0.7)
Eosinophils Relative: 2 % (ref 0–5)
HCT: 41.3 % (ref 36.0–46.0)
HEMATOCRIT: 39.7 % (ref 36.0–46.0)
HEMOGLOBIN: 13.9 g/dL (ref 12.0–15.0)
HEMOGLOBIN: 13 g/dL (ref 12.0–15.0)
LYMPHS ABS: 3.5 10*3/uL (ref 0.7–4.0)
LYMPHS ABS: 1.1 10*3/uL (ref 0.7–4.0)
Lymphocytes Relative: 30 % (ref 12–46)
Lymphocytes Relative: 9 % — ABNORMAL LOW (ref 12–46)
MCH: 30.5 pg (ref 26.0–34.0)
MCH: 29.7 pg (ref 26.0–34.0)
MCHC: 33.7 g/dL (ref 30.0–36.0)
MCHC: 32.7 g/dL (ref 30.0–36.0)
MCV: 90.6 fL (ref 78.0–100.0)
MCV: 90.6 fL (ref 78.0–100.0)
MONO ABS: 1.1 10*3/uL — AB (ref 0.1–1.0)
MONOS PCT: 8 % (ref 3–12)
MONOS PCT: 8 % (ref 3–12)
Monocytes Absolute: 0.9 10*3/uL (ref 0.1–1.0)
NEUTROS PCT: 81 % — AB (ref 43–77)
Neutro Abs: 6.6 10*3/uL (ref 1.7–7.7)
Neutro Abs: 10.4 10*3/uL — ABNORMAL HIGH (ref 1.7–7.7)
Neutrophils Relative %: 57 % (ref 43–77)
Platelets: 237 10*3/uL (ref 150–400)
Platelets: 204 10*3/uL (ref 150–400)
RBC: 4.56 MIL/uL (ref 3.87–5.11)
RBC: 4.38 MIL/uL (ref 3.87–5.11)
RDW: 15.8 % — ABNORMAL HIGH (ref 11.5–15.5)
RDW: 15.8 % — ABNORMAL HIGH (ref 11.5–15.5)
WBC: 11.5 10*3/uL — ABNORMAL HIGH (ref 4.0–10.5)
WBC: 12.9 10*3/uL — ABNORMAL HIGH (ref 4.0–10.5)

## 2014-03-12 LAB — COMPREHENSIVE METABOLIC PANEL
ALK PHOS: 141 U/L — AB (ref 39–117)
ALK PHOS: 124 U/L — AB (ref 39–117)
ALT: 17 U/L (ref 0–35)
ALT: 15 U/L (ref 0–35)
ANION GAP: 20 — AB (ref 5–15)
AST: 27 U/L (ref 0–37)
AST: 19 U/L (ref 0–37)
Albumin: 3.4 g/dL — ABNORMAL LOW (ref 3.5–5.2)
Albumin: 3.2 g/dL — ABNORMAL LOW (ref 3.5–5.2)
Anion gap: 23 — ABNORMAL HIGH (ref 5–15)
BILIRUBIN TOTAL: 0.3 mg/dL (ref 0.3–1.2)
BUN: 68 mg/dL — AB (ref 6–23)
BUN: 67 mg/dL — AB (ref 6–23)
CALCIUM: 9.3 mg/dL (ref 8.4–10.5)
CHLORIDE: 95 meq/L — AB (ref 96–112)
CO2: 18 mEq/L — ABNORMAL LOW (ref 19–32)
CO2: 20 mEq/L (ref 19–32)
Calcium: 9 mg/dL (ref 8.4–10.5)
Chloride: 94 mEq/L — ABNORMAL LOW (ref 96–112)
Creatinine, Ser: 12.69 mg/dL — ABNORMAL HIGH (ref 0.50–1.10)
Creatinine, Ser: 12.88 mg/dL — ABNORMAL HIGH (ref 0.50–1.10)
GFR calc Af Amer: 3 mL/min — ABNORMAL LOW (ref >90)
GFR, EST AFRICAN AMERICAN: 3 mL/min — AB (ref >90)
GFR, EST NON AFRICAN AMERICAN: 3 mL/min — AB (ref >90)
GFR, EST NON AFRICAN AMERICAN: 2 mL/min — AB (ref >90)
GLUCOSE: 111 mg/dL — AB (ref 70–99)
Glucose, Bld: 91 mg/dL (ref 70–99)
POTASSIUM: 7.3 meq/L — AB (ref 3.7–5.3)
Potassium: 7.5 mEq/L (ref 3.7–5.3)
Sodium: 135 mEq/L — ABNORMAL LOW (ref 137–147)
Sodium: 135 mEq/L — ABNORMAL LOW (ref 137–147)
Total Bilirubin: 0.3 mg/dL (ref 0.3–1.2)
Total Protein: 9.1 g/dL — ABNORMAL HIGH (ref 6.0–8.3)
Total Protein: 8.5 g/dL — ABNORMAL HIGH (ref 6.0–8.3)

## 2014-03-12 LAB — URINALYSIS, ROUTINE W REFLEX MICROSCOPIC
BILIRUBIN URINE: NEGATIVE
GLUCOSE, UA: 100 mg/dL — AB
Ketones, ur: NEGATIVE mg/dL
Leukocytes, UA: NEGATIVE
Nitrite: NEGATIVE
Protein, ur: 30 mg/dL — AB
Specific Gravity, Urine: 1.009 (ref 1.005–1.030)
Urobilinogen, UA: 0.2 mg/dL (ref 0.0–1.0)
pH: 8.5 — ABNORMAL HIGH (ref 5.0–8.0)

## 2014-03-12 LAB — URINE MICROSCOPIC-ADD ON

## 2014-03-12 LAB — PROCALCITONIN: Procalcitonin: 0.41 ng/mL

## 2014-03-12 LAB — MRSA PCR SCREENING: MRSA BY PCR: NEGATIVE

## 2014-03-12 LAB — GLUCOSE, CAPILLARY: GLUCOSE-CAPILLARY: 133 mg/dL — AB (ref 70–99)

## 2014-03-12 LAB — MAGNESIUM: MAGNESIUM: 3 mg/dL — AB (ref 1.5–2.5)

## 2014-03-12 LAB — I-STAT CG4 LACTIC ACID, ED: Lactic Acid, Venous: 1.72 mmol/L (ref 0.5–2.2)

## 2014-03-12 LAB — TROPONIN I
Troponin I: <0.3 ng/mL (ref <0.30)
Troponin I: <0.3 ng/mL (ref <0.30)

## 2014-03-12 LAB — LACTIC ACID, PLASMA: LACTIC ACID, VENOUS: 2 mmol/L (ref 0.5–2.2)

## 2014-03-12 LAB — PHOSPHORUS: Phosphorus: 6.2 mg/dL — ABNORMAL HIGH (ref 2.3–4.6)

## 2014-03-12 MED ORDER — DEXTROSE 50 % IV SOLN
INTRAVENOUS | Status: AC
  Filled 2014-03-12: qty 50

## 2014-03-12 MED ORDER — CHLORHEXIDINE GLUCONATE 0.12 % MT SOLN
15.0000 mL | Freq: Two times a day (BID) | OROMUCOSAL | Status: DC
  Administered 2014-03-12 – 2014-03-14 (×4): 15 mL via OROMUCOSAL
  Filled 2014-03-12 (×6): qty 15

## 2014-03-12 MED ORDER — INSULIN ASPART 100 UNIT/ML ~~LOC~~ SOLN
5.0000 [IU] | Freq: Once | SUBCUTANEOUS | Status: AC
  Administered 2014-03-12: 5 [IU] via SUBCUTANEOUS

## 2014-03-12 MED ORDER — DEXTROSE 50 % IV SOLN
1.0000 | Freq: Once | INTRAVENOUS | Status: AC
  Administered 2014-03-12: 50 mL via INTRAVENOUS

## 2014-03-12 MED ORDER — ALBUTEROL SULFATE (2.5 MG/3ML) 0.083% IN NEBU: 2.5000 mg | INHALATION_SOLUTION | RESPIRATORY_TRACT | Status: DC | PRN

## 2014-03-12 MED ORDER — NITROGLYCERIN 0.4 MG SL SUBL
0.4000 mg | SUBLINGUAL_TABLET | Freq: Once | SUBLINGUAL | Status: AC
  Administered 2014-03-12: 0.4 mg via SUBLINGUAL

## 2014-03-12 MED ORDER — PANTOPRAZOLE SODIUM 40 MG IV SOLR
40.0000 mg | INTRAVENOUS | Status: DC
  Administered 2014-03-12: 40 mg via INTRAVENOUS
  Filled 2014-03-12 (×2): qty 40

## 2014-03-12 MED ORDER — CHLORHEXIDINE GLUCONATE 0.12 % MT SOLN: 15.0000 mL | Freq: Two times a day (BID) | OROMUCOSAL | Status: DC

## 2014-03-12 MED ORDER — SODIUM BICARBONATE 8.4 % IV SOLN
INTRAVENOUS | Status: DC
  Administered 2014-03-12: 15:00:00 via INTRAVENOUS
  Filled 2014-03-12 (×2): qty 150

## 2014-03-12 MED ORDER — LIDOCAINE HCL (PF) 1 % IJ SOLN
5.0000 mL | INTRAMUSCULAR | Status: DC | PRN
Start: 2014-03-12 — End: 2014-03-14

## 2014-03-12 MED ORDER — HEPARIN SODIUM (PORCINE) 1000 UNIT/ML DIALYSIS
1000.0000 [IU] | INTRAMUSCULAR | Status: DC | PRN
  Filled 2014-03-12: qty 1

## 2014-03-12 MED ORDER — SODIUM CHLORIDE 0.9 % IV SOLN: 100.0000 mL | INTRAVENOUS | Status: DC | PRN

## 2014-03-12 MED ORDER — SODIUM CHLORIDE 0.9 % IV SOLN
1.0000 g | Freq: Once | INTRAVENOUS | Status: AC
  Administered 2014-03-12: 1 g via INTRAVENOUS
  Filled 2014-03-12: qty 10

## 2014-03-12 MED ORDER — SODIUM CHLORIDE 0.9 % IJ SOLN
3.0000 mL | Freq: Two times a day (BID) | INTRAMUSCULAR | Status: DC
  Administered 2014-03-12 – 2014-03-14 (×4): 3 mL via INTRAVENOUS

## 2014-03-12 MED ORDER — NITROGLYCERIN IN D5W 200-5 MCG/ML-% IV SOLN
0.0000 ug/min | Freq: Once | INTRAVENOUS | Status: AC
  Administered 2014-03-12: 20 ug/min via INTRAVENOUS
  Filled 2014-03-12: qty 250

## 2014-03-12 MED ORDER — HEPARIN SODIUM (PORCINE) 1000 UNIT/ML DIALYSIS
4000.0000 [IU] | Freq: Once | INTRAMUSCULAR | Status: AC
  Administered 2014-03-13: 4000 [IU] via INTRAVENOUS_CENTRAL

## 2014-03-12 MED ORDER — NEPRO/CARBSTEADY PO LIQD
237.0000 mL | ORAL | Status: DC | PRN
  Filled 2014-03-12: qty 237

## 2014-03-12 MED ORDER — HEPARIN SODIUM (PORCINE) 5000 UNIT/ML IJ SOLN
5000.0000 [IU] | Freq: Three times a day (TID) | INTRAMUSCULAR | Status: DC
  Administered 2014-03-12 – 2014-03-14 (×5): 5000 [IU] via SUBCUTANEOUS
  Filled 2014-03-12 (×8): qty 1

## 2014-03-12 MED ORDER — INSULIN ASPART 100 UNIT/ML ~~LOC~~ SOLN
0.0000 [IU] | SUBCUTANEOUS | Status: DC
Start: 2014-03-12 — End: 2014-03-14
  Administered 2014-03-12: 1 [IU] via SUBCUTANEOUS
  Administered 2014-03-13: 2 [IU] via SUBCUTANEOUS
  Administered 2014-03-14: 1 [IU] via SUBCUTANEOUS

## 2014-03-12 MED ORDER — PANTOPRAZOLE SODIUM 40 MG IV SOLR: 40.0000 mg | INTRAVENOUS | Status: DC

## 2014-03-12 MED ORDER — LIDOCAINE-PRILOCAINE 2.5-2.5 % EX CREA
1.0000 "application " | TOPICAL_CREAM | CUTANEOUS | Status: DC | PRN
  Filled 2014-03-12: qty 5

## 2014-03-12 MED ORDER — ALBUTEROL SULFATE (2.5 MG/3ML) 0.083% IN NEBU
10.0000 mg | INHALATION_SOLUTION | Freq: Once | RESPIRATORY_TRACT | Status: AC
  Administered 2014-03-12: 10 mg via RESPIRATORY_TRACT
  Filled 2014-03-12 (×2): qty 12

## 2014-03-12 MED ORDER — ALTEPLASE 2 MG IJ SOLR
2.0000 mg | Freq: Once | INTRAMUSCULAR | Status: AC | PRN
  Filled 2014-03-12: qty 2

## 2014-03-12 MED ORDER — CETYLPYRIDINIUM CHLORIDE 0.05 % MT LIQD
7.0000 mL | Freq: Two times a day (BID) | OROMUCOSAL | Status: DC
  Administered 2014-03-13 – 2014-03-14 (×2): 7 mL via OROMUCOSAL

## 2014-03-12 MED ORDER — DARBEPOETIN ALFA-POLYSORBATE 25 MCG/0.42ML IJ SOLN: 25.0000 ug | INTRAMUSCULAR | Status: DC

## 2014-03-12 MED ORDER — PENTAFLUOROPROP-TETRAFLUOROETH EX AERO: 1.0000 "application " | INHALATION_SPRAY | CUTANEOUS | Status: DC | PRN

## 2014-03-12 MED ORDER — INFLUENZA VAC SPLIT QUAD 0.5 ML IM SUSY
0.5000 mL | PREFILLED_SYRINGE | INTRAMUSCULAR | Status: AC
Start: 2014-03-13 — End: 2014-03-13
  Administered 2014-03-13: 0.5 mL via INTRAMUSCULAR
  Filled 2014-03-12: qty 0.5

## 2014-03-12 MED ORDER — DOXERCALCIFEROL 4 MCG/2ML IV SOLN
2.0000 ug | INTRAVENOUS | Status: DC
  Administered 2014-03-13: 2 ug via INTRAVENOUS
  Filled 2014-03-12: qty 2

## 2014-03-12 MED ORDER — SODIUM CHLORIDE 0.9 % IV SOLN: 250.0000 mL | INTRAVENOUS | Status: DC | PRN

## 2014-03-12 MED ORDER — SODIUM CHLORIDE 0.9 % IJ SOLN: 3.0000 mL | INTRAMUSCULAR | Status: DC | PRN

## 2014-03-12 NOTE — ED Provider Notes (Signed)
CSN: [id number]     Arrival date & time 03/12/14  1114 History   First MD Initiated Contact with Patient 03/12/14 1118     Chief Complaint  Patient presents with  . Respiratory Distress    Level V caveat: Respiratory distress  HPI Patient brought to the emergency department by EMS after missed dialysis Friday and respiratory distress with hypoxia.  Patient brought to the emergency department on CPAP.  Patient normally dialyzes Monday Wednesday Friday.  She missed dialysis on Friday secondary to a family emergency.  She was trying to get through to Monday for dialysis but felt increased shortness of breath over the past several hours.  Patient states she feels better on CPAP.  No history of pulmonary embolism.  Denies chest pain.  Reports orthopnea.   Past Medical History  Diagnosis Date  . Hyperlipidemia   . Normal cardiac stress test 12/24/2009    lexiscan, imaging normal  . 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 Diverticulitis   . IBS (irritable bowel syndrome)   . Thyroid disease   . Depression   . Chronic kidney disease     Hemo MWF  . GERD (gastroesophageal reflux disease)   . H/O hiatal hernia   . Arthritis   . RLS (restless legs syndrome)   . Tubular adenoma of colon 01/2008  . Seizures     2004  . Constipation   . Sinus complaint   . Dialysis patient     kidney  . Renal disorder   . Adrenal mass   . Hypertension   . High cholesterol   . Back pain    Past Surgical History  Procedure Laterality Date  . Cardiac catheterization  2003    normal  . Cholecystectomy      Open mid line incision  . Frontal craniotomy  2002    indication = sinusitis  . Appendectomy    . Tubal ligation    . Abdominal hysterectomy    . Insertion of dialysis catheter      Left  . Revison of arteriovenous fistula  05/11/2012    Procedure: REVISON OF ARTERIOVENOUS FISTULA;  Surgeon: Elam Dutch, MD;  Location: [place];  Service: Vascular;   Laterality: Right;  . Av fistula placement Left 11/11/2012    Procedure: INSERTION OF ARTERIOVENOUS (AV) GORE-TEX GRAFT ARM;  Surgeon: Angelia Mould, MD;  Location: [place];  Service: Vascular;  Laterality: Left;  . Avgg removal Left 11/18/2012    Procedure: REMOVAL OF LEFT UPPER ARM ARTERIOVENOUS GORETEX GRAFT ([place]);  Surgeon: Angelia Mould, MD;  Location: [place];  Service: Vascular;  Laterality: Left;  . Patch angioplasty Left 11/18/2012    Procedure: PATCH ANGIOPLASTY;  Surgeon: Angelia Mould, MD;  Location: [place];  Service: Vascular;  Laterality: Left;  . Frontal cranialcity  2002   Family History  Problem Relation Age of Onset  . Thyroid cancer Mother   . Heart disease Father   . Hypertension Father   . Heart attack Father   . Breast cancer Sister   . Lupus Daughter    History  Substance Use Topics  . Smoking status: Former Smoker -- 0.10 packs/day for 50 years    Types: Cigarettes    Quit date: 11/08/2013  . Smokeless tobacco: Not on file     Comment: pt states she smokes "1" cig per day  . Alcohol Use: No   OB  History   Grav Para Term Preterm Abortions TAB SAB Ect Mult Living   5 3 3             Review of Systems  Unable to perform ROS: Severe respiratory distress      Allergies  Codeine; Codeine; Tuberculin tests; Penicillins; and Sulfamethoxazole  Home Medications   Prior to Admission medications   Medication Sig Start Date End Date Taking? Authorizing Provider  allopurinol (ZYLOPRIM) 100 MG tablet TAKE ONE TABLET BY MOUTH ONCE DAILY ON  THE  DAYS  FOLLOWING  DIALYSIS  (TUES,  THURS,  AND  SAT) 02/20/14   Coral Spikes, DO  allopurinol (ZYLOPRIM) 100 MG tablet Take 100 mg by mouth 4 (four) times a week. On non-dialysis days Tuesday, Thursday, Saturday, and Sunday    Historical Provider, MD  ALPRAZolam Duanne Moron) 0.25 MG tablet Take 1 tablet (0.25 mg total) by mouth once. 11/10/13   Hulen Luster, DO  amLODipine (NORVASC) 10 MG tablet TAKE ONE  TABLET BY MOUTH ONCE DAILY 11/21/13   Coral Spikes, DO  amLODipine (NORVASC) 10 MG tablet Take 10 mg by mouth at bedtime.    Historical Provider, MD  ARIPiprazole (ABILIFY) 2 MG tablet Take 0.5 tablets (1 mg total) by mouth daily. 05/05/13   Coral Spikes, DO  ARIPiprazole (ABILIFY) 2 MG tablet Take 1 mg by mouth every morning.    Historical Provider, MD  aspirin EC 81 MG tablet Take 81 mg by mouth every morning.    Historical Provider, MD  aspirin EC 81 MG tablet Take 81 mg by mouth every morning.    Historical Provider, MD  atorvastatin (LIPITOR) 20 MG tablet Take 20 mg by mouth every morning.    Historical Provider, MD  atorvastatin (LIPITOR) 40 MG tablet Take 1 tablet (40 mg total) by mouth daily. 10/07/13   Coral Spikes, DO  Calcium Acetate 667 MG TABS  09/15/12   Historical Provider, MD  Calcium Carbonate Antacid (TUMS PO) Take 1 tablet by mouth daily as needed. For stomach upset    Historical Provider, MD  cinacalcet (SENSIPAR) 30 MG tablet Take 30 mg by mouth at bedtime.    Historical Provider, MD  cloNIDine (CATAPRES) 0.1 MG tablet Take 1 tablet (0.1 mg total) by mouth 3 (three) times daily. 08/30/12   Coral Spikes, DO  cloNIDine (CATAPRES) 0.2 MG tablet Take 0.2 mg by mouth 2 (two) times daily.    Historical Provider, MD  cyclobenzaprine (FLEXERIL) 5 MG tablet TAKE ONE TABLET BY MOUTH THREE TIMES DAILY AS NEEDED FOR MUSCLE SPASM 11/20/12   Coral Spikes, DO  Dexlansoprazole 30 MG capsule Take 1 capsule (30 mg total) by mouth daily. 05/08/13   Coral Spikes, DO  fluticasone (FLONASE) 50 MCG/ACT nasal spray Place 2 sprays into the nose daily. 08/19/12   Coral Spikes, DO  fluticasone (FLONASE) 50 MCG/ACT nasal spray Place 1 spray into both nostrils daily as needed for allergies or rhinitis.    Historical Provider, MD  folic acid-vitamin b complex-vitamin c-selenium-zinc (DIALYVITE) 3 MG TABS tablet Take 1 tablet by mouth daily.    Historical Provider, MD  folic acid-vitamin b complex-vitamin  c-selenium-zinc (DIALYVITE) 3 MG TABS Take 1 tablet by mouth daily.    Historical Provider, MD  ibuprofen (ADVIL,MOTRIN) 200 MG tablet Take 200 mg by mouth every 6 (six) hours as needed.    Historical Provider, MD  lidocaine-prilocaine (EMLA) cream Apply 1 application topically 3 (three) times a week.  Use before dialysis on MWF    Historical Provider, MD  loratadine (CLARITIN) 10 MG tablet Take 1 tablet (10 mg total) by mouth daily. 08/19/12   Coral Spikes, DO  loratadine (CLARITIN) 10 MG tablet Take 10 mg by mouth daily as needed for allergies.    Historical Provider, MD  metoprolol succinate (TOPROL-XL) 100 MG 24 hr tablet Take 100 mg by mouth every evening. Take with or immediately following a meal.    Historical Provider, MD  metoprolol succinate (TOPROL-XL) 100 MG 24 hr tablet Take 100 mg by mouth at bedtime. Take with or immediately following a meal.    Historical Provider, MD  nicotine (NICODERM CQ - DOSED IN MG/24 HOURS) 21 mg/24hr patch Place 1 patch (21 mg total) onto the skin daily. 11/18/13   Coral Spikes, DO  omeprazole (PRILOSEC) 20 MG capsule Take 20 mg by mouth 2 (two) times daily.    Historical Provider, MD  polyethylene glycol powder (GLYCOLAX/MIRALAX) powder Take 17 g by mouth daily as needed. For constipation    Historical Provider, MD  pregabalin (LYRICA) 50 MG capsule Take one 50mg  capsule daily. Add additional 50mg  capsule 3 times per week after dialysis 01/11/13   Hulen Luster, DO  pregabalin (LYRICA) 75 MG capsule Take 75 mg by mouth 2 (two) times daily.    Historical Provider, MD  senna (SENOKOT) 8.6 MG tablet Take 2 tablets by mouth daily as needed. For constipation    Historical Provider, MD  SENSIPAR 30 MG tablet 1 tablet daily. 11/02/13   Historical Provider, MD  sevelamer carbonate (RENVELA) 800 MG tablet Take 800 mg by mouth 3 (three) times daily with meals.    Historical Provider, MD  sevelamer carbonate (RENVELA) 800 MG tablet Take 2,400 mg by mouth 3 (three) times  daily with meals.    Historical Provider, MD   There were no vitals taken for this visit. Physical Exam  Nursing note and vitals reviewed. Constitutional: She is oriented to person, place, and time. She appears well-developed and well-nourished. No distress.  HENT:  Head: Normocephalic and atraumatic.  Eyes: EOM are normal.  Neck: Normal range of motion.  Cardiovascular: Regular rhythm and normal heart sounds.   Tachycardia  Pulmonary/Chest: Accessory muscle usage present. Tachypnea noted. She is in respiratory distress. She has no wheezes. She has no rhonchi. She has rales in the right upper field, the right lower field, the left upper field and the left lower field.  Abdominal: Soft. She exhibits no distension. There is no tenderness.  Musculoskeletal: Normal range of motion. She exhibits no edema.  Neurological: She is alert and oriented to person, place, and time.  Skin: Skin is warm and dry.  Psychiatric:  Anxious    ED Course  Procedures (including critical care time)  CRITICAL CARE Performed by: [name] Total critical care time: 40 Critical care time was exclusive of separately billable procedures and treating other patients. Critical care was necessary to treat or prevent imminent or life-threatening deterioration. Critical care was time spent personally by me on the following activities: development of treatment plan with patient and/or surrogate as well as nursing, discussions with consultants, evaluation of patient's response to treatment, examination of patient, obtaining history from patient or surrogate, ordering and performing treatments and interventions, ordering and review of laboratory studies, ordering and review of radiographic studies, pulse oximetry and re-evaluation of patient's condition.   Labs Review Labs Reviewed  CBC WITH DIFFERENTIAL - Abnormal; Notable for the following:  WBC 11.5 (*)    RDW 15.8 (*)    All other components within normal  limits  I-STAT ARTERIAL BLOOD GAS, ED - Abnormal; Notable for the following:    pH, Arterial 7.348 (*)    pO2, Arterial 55.0 (*)    Acid-base deficit 3.0 (*)    All other components within normal limits  COMPREHENSIVE METABOLIC PANEL  TROPONIN I  CBC  COMPREHENSIVE METABOLIC PANEL  MAGNESIUM  PHOSPHORUS  TROPONIN I  LACTIC ACID, PLASMA  PROCALCITONIN  CBC WITH DIFFERENTIAL  URINALYSIS, ROUTINE W REFLEX MICROSCOPIC  I-STAT CG4 LACTIC ACID, ED    Imaging Review No results found.   EKG Interpretation   Date/Time:  Sunday March 12 2014 11:19:48 EDT Ventricular Rate:  103 PR Interval:  197 QRS Duration: 92 QT Interval:  355 QTC Calculation: 465 R Axis:   0 Text Interpretation:  Sinus tachycardia Consider anterior infarct Baseline  wander in lead(s) I II III aVR aVL aVF V2 V3 V4 V5 V6 peaked T waves  compared to prior Otherwise no significant change Confirmed by Caleyah Jr  MD,  Lennette Bihari ([id number]) on 03/12/2014 11:33:47 AM      MDM   Final diagnoses:  Acute respiratory failure with hypoxia  Hypervolemia, unspecified hypervolemia type  ESRD (end stage renal disease)    Severe respiratory distress with acute respiratory failure on arrival.  Temporized well on BiPAP.  Feeling better.  IV nitrates to help with afterload reduction.  Patient with severe diffuse pulmonary edema secondary to volume overload from his dialysis.  Patient will need emergent dialysis.  We'll continue on BiPAP at this time.  Admit to the intensive care unit.  I spoke with nephrology in regards to her need for emergent dialysis.  Patient with peak T waves on EKG.  Calcium gluconate given    [name], MD 03/12/14 1227

## 2014-03-12 NOTE — ED Notes (Signed)
Pt here from home , pt was headed to her make up dialysis appointment and became sob, pt was placed on NRB but sats remained 70 % pt then placed on cpap , pt also received albuterol per EMS

## 2014-03-12 NOTE — ED Notes (Signed)
Nephrology at the bedside.

## 2014-03-12 NOTE — ED Notes (Signed)
Sats upon arrival 84 % on room air  When the pt arrived

## 2014-03-12 NOTE — Procedures (Signed)
I was present at this dialysis session, have reviewed the session itself and made  appropriate changes  Kelly Splinter MD (pgr) [phone]    [phone] 03/12/2014, 4:12 PM

## 2014-03-12 NOTE — Progress Notes (Signed)
Utilization review completed.  

## 2014-03-12 NOTE — ED Notes (Signed)
Report given to 2M 

## 2014-03-12 NOTE — H&P (Addendum)
PULMONARY / CRITICAL CARE MEDICINE   Name: [name] MRN: [id number] DOB: [birth year]    ADMISSION DATE:  03/12/2014   REFERRING MD :  EDP  CHIEF COMPLAINT:  SOB   INITIAL PRESENTATION: 73 yo female with ESRD on HD M/W/F presented to ER resp distress found to have pulmonary edema after missing HD 2 days ago. Placed on BIPAP and Nitro drip . PCCM asked to admit   STUDIES:    SIGNIFICANT EVENTS: 10/11 STAT Renal consult >  HISTORY OF PRESENT ILLNESS:  73 yo obese female with known hx of ESRD on HD on M/W/F presents to ER for shortness of breath. Missed her HD 2 days ago. Says she was taking care of special needs family member and could not get there. Says she does very well with HD but over last 24 hr has become very short of breath. On arrival to ER found to be in resp distress and hypoxic . Placed on BIPAP with improved oxygenation and decreased WOB. She is comfortable on BIPAP in ER w/ good sats.  CXR showed pulmonary edema pattern. Renal paged for consult for HD.    PAST MEDICAL HISTORY :   has a past medical history of Hyperlipidemia; Normal cardiac stress test (12/24/2009); ANEMIA NEC (03/31/2007); Diverticulitis; IBS (irritable bowel syndrome); Thyroid disease; Depression; Chronic kidney disease; GERD (gastroesophageal reflux disease); H/O hiatal hernia; Arthritis; RLS (restless legs syndrome); Tubular adenoma of colon (01/2008); Seizures; Constipation; Sinus complaint; Dialysis patient; Renal disorder; Adrenal mass; Hypertension; High cholesterol; and Back pain.  has past surgical history that includes Cardiac catheterization (2003); Cholecystectomy; frontal craniotomy (2002); Appendectomy; Tubal ligation; Abdominal hysterectomy; Insertion of dialysis catheter; Revison of arteriovenous fistula (05/11/2012); AV fistula placement (Left, 11/11/2012); Arteriovenous goretex graft removal (Left, 11/18/2012); Patch angioplasty (Left, 11/18/2012); and frontal cranialcity (2002). Prior to  Admission medications   Medication Sig Start Date End Date Taking? Authorizing Provider  allopurinol (ZYLOPRIM) 100 MG tablet TAKE ONE TABLET BY MOUTH ONCE DAILY ON  THE  DAYS  FOLLOWING  DIALYSIS  (TUES,  THURS,  AND  SAT) 02/20/14   Coral Spikes, DO  allopurinol (ZYLOPRIM) 100 MG tablet Take 100 mg by mouth 4 (four) times a week. On non-dialysis days Tuesday, Thursday, Saturday, and Sunday    Historical Provider, MD  ALPRAZolam Duanne Moron) 0.25 MG tablet Take 1 tablet (0.25 mg total) by mouth once. 11/10/13   Hulen Luster, DO  amLODipine (NORVASC) 10 MG tablet TAKE ONE TABLET BY MOUTH ONCE DAILY 11/21/13   Coral Spikes, DO  amLODipine (NORVASC) 10 MG tablet Take 10 mg by mouth at bedtime.    Historical Provider, MD  ARIPiprazole (ABILIFY) 2 MG tablet Take 0.5 tablets (1 mg total) by mouth daily. 05/05/13   Coral Spikes, DO  ARIPiprazole (ABILIFY) 2 MG tablet Take 1 mg by mouth every morning.    Historical Provider, MD  aspirin EC 81 MG tablet Take 81 mg by mouth every morning.    Historical Provider, MD  aspirin EC 81 MG tablet Take 81 mg by mouth every morning.    Historical Provider, MD  atorvastatin (LIPITOR) 20 MG tablet Take 20 mg by mouth every morning.    Historical Provider, MD  atorvastatin (LIPITOR) 40 MG tablet Take 1 tablet (40 mg total) by mouth daily. 10/07/13   Coral Spikes, DO  Calcium Acetate 667 MG TABS  09/15/12   Historical Provider, MD  Calcium Carbonate Antacid (TUMS PO) Take 1 tablet by mouth daily as  needed. For stomach upset    Historical Provider, MD  cinacalcet (SENSIPAR) 30 MG tablet Take 30 mg by mouth at bedtime.    Historical Provider, MD  cloNIDine (CATAPRES) 0.1 MG tablet Take 1 tablet (0.1 mg total) by mouth 3 (three) times daily. 08/30/12   Coral Spikes, DO  cloNIDine (CATAPRES) 0.2 MG tablet Take 0.2 mg by mouth 2 (two) times daily.    Historical Provider, MD  cyclobenzaprine (FLEXERIL) 5 MG tablet TAKE ONE TABLET BY MOUTH THREE TIMES DAILY AS NEEDED FOR MUSCLE  SPASM 11/20/12   Coral Spikes, DO  Dexlansoprazole 30 MG capsule Take 1 capsule (30 mg total) by mouth daily. 05/08/13   Coral Spikes, DO  fluticasone (FLONASE) 50 MCG/ACT nasal spray Place 2 sprays into the nose daily. 08/19/12   Coral Spikes, DO  fluticasone (FLONASE) 50 MCG/ACT nasal spray Place 1 spray into both nostrils daily as needed for allergies or rhinitis.    Historical Provider, MD  folic acid-vitamin b complex-vitamin c-selenium-zinc (DIALYVITE) 3 MG TABS tablet Take 1 tablet by mouth daily.    Historical Provider, MD  folic acid-vitamin b complex-vitamin c-selenium-zinc (DIALYVITE) 3 MG TABS Take 1 tablet by mouth daily.    Historical Provider, MD  ibuprofen (ADVIL,MOTRIN) 200 MG tablet Take 200 mg by mouth every 6 (six) hours as needed.    Historical Provider, MD  lidocaine-prilocaine (EMLA) cream Apply 1 application topically 3 (three) times a week. Use before dialysis on MWF    Historical Provider, MD  loratadine (CLARITIN) 10 MG tablet Take 1 tablet (10 mg total) by mouth daily. 08/19/12   Coral Spikes, DO  loratadine (CLARITIN) 10 MG tablet Take 10 mg by mouth daily as needed for allergies.    Historical Provider, MD  metoprolol succinate (TOPROL-XL) 100 MG 24 hr tablet Take 100 mg by mouth every evening. Take with or immediately following a meal.    Historical Provider, MD  metoprolol succinate (TOPROL-XL) 100 MG 24 hr tablet Take 100 mg by mouth at bedtime. Take with or immediately following a meal.    Historical Provider, MD  nicotine (NICODERM CQ - DOSED IN MG/24 HOURS) 21 mg/24hr patch Place 1 patch (21 mg total) onto the skin daily. 11/18/13   Coral Spikes, DO  omeprazole (PRILOSEC) 20 MG capsule Take 20 mg by mouth 2 (two) times daily.    Historical Provider, MD  polyethylene glycol powder (GLYCOLAX/MIRALAX) powder Take 17 g by mouth daily as needed. For constipation    Historical Provider, MD  pregabalin (LYRICA) 50 MG capsule Take one 50mg  capsule daily. Add additional 50mg   capsule 3 times per week after dialysis 01/11/13   Hulen Luster, DO  pregabalin (LYRICA) 75 MG capsule Take 75 mg by mouth 2 (two) times daily.    Historical Provider, MD  senna (SENOKOT) 8.6 MG tablet Take 2 tablets by mouth daily as needed. For constipation    Historical Provider, MD  SENSIPAR 30 MG tablet 1 tablet daily. 11/02/13   Historical Provider, MD  sevelamer carbonate (RENVELA) 800 MG tablet Take 800 mg by mouth 3 (three) times daily with meals.    Historical Provider, MD  sevelamer carbonate (RENVELA) 800 MG tablet Take 2,400 mg by mouth 3 (three) times daily with meals.    Historical Provider, MD   Allergies  Allergen Reactions  . Codeine Nausea And Vomiting  . Codeine Other (See Comments)    unknown  . Tuberculin Tests Hives    "  blisters"  . Penicillins Rash    No problems breathing. Has tolerated omnicef in past without issue  . Sulfamethoxazole Rash    FAMILY HISTORY:  indicated that her mother is deceased. She indicated that her father is deceased.  SOCIAL HISTORY:  reports that she quit smoking about 4 months ago. Her smoking use included Cigarettes. She has a 5 pack-year smoking history. She does not have any smokeless tobacco history on file. She reports that she does not drink alcohol or use illicit drugs.  REVIEW OF SYSTEMS:   Constitutional:   No  weight loss, night sweats,  Fevers, chills,  ++fatigue, or  lassitude.  HEENT:   No headaches,  Difficulty swallowing,  Tooth/dental problems, or  Sore throat,                No sneezing, itching, ear ache, nasal congestion, post nasal drip,   CV:  No chest pain,  Orthopnea, PND, swelling in lower extremities, anasarca, dizziness, palpitations, syncope.   GI  No heartburn, indigestion, abdominal pain, nausea, vomiting, diarrhea, change in bowel habits, loss of appetite, bloody stools.   Resp: ++ shortness of breath with exertion or at rest.   No excess mucus, no productive cough,  No non-productive cough,  No  coughing up of blood.  No change in color of mucus.  No wheezing.  No chest wall deformity  Skin: no rash or lesions.  GU: no dysuria, change in color of urine, no urgency or frequency.  No flank pain, no hematuria   MS:  No joint pain or swelling.  No decreased range of motion.  + back pain.  Psych:  No change in mood or affect. No depression or anxiety.  No memory loss.       SUBJECTIVE:   VITAL SIGNS: Pulse Rate:  [120] 120 (10/11 1138) Resp:  [17-32] 17 (10/11 1138) BP: (150)/(66) 150/66 mmHg (10/11 1130) SpO2:  [89 %] 89 % (10/11 1115) FiO2 (%):  [75 %] 75 % (10/11 1138) HEMODYNAMICS:   VENTILATOR SETTINGS: Vent Mode:  [-]  FiO2 (%):  [75 %] 75 % INTAKE / OUTPUT: No intake or output data in the 24 hours ending 03/12/14 1201  PHYSICAL EXAMINATION: General:  Morbidly obese pt on BIPAP  Neuro:  Alert and oriented, follows commands  HEENT:  Dry mucosa , BIPAP in place  Cardiovascular:  ST , no m/r/g  Lungs:  Bibasilar crackles  Abdomen:  Soft , obese, NT , BS + Musculoskeletal:  Intact  Skin:  Intact   LABS:  CBC No results found for this basename: WBC, HGB, HCT, PLT,  in the last 168 hours Coag's No results found for this basename: APTT, INR,  in the last 168 hours BMET No results found for this basename: NA, K, CL, CO2, BUN, CREATININE, GLUCOSE,  in the last 168 hours Electrolytes No results found for this basename: CALCIUM, MG, PHOS,  in the last 168 hours Sepsis Markers  Recent Labs Lab 03/12/14 1132  LATICACIDVEN 1.72   ABG No results found for this basename: PHART, PCO2ART, PO2ART,  in the last 168 hours Liver Enzymes No results found for this basename: AST, ALT, ALKPHOS, BILITOT, ALBUMIN,  in the last 168 hours Cardiac Enzymes No results found for this basename: TROPONINI, PROBNP,  in the last 168 hours Glucose No results found for this basename: GLUCAP,  in the last 168 hours  Imaging No results found.   ASSESSMENT /  PLAN:  PULMONARY  A: Acute Hypoxic  Respiratory Failure  Pulmonary edema  Smoker  P:   BIPAP change to almost CPAP as this is an oxygenation issue and has pressure leak abg reviewed, PH wnl on own To 10/8, use NIMV 4 hr on 30 min off once HD perfromed O2 to keep sats >90% Check cxr follow up in am for resolution Needs HD stat, have contacted renal Drop Afterload  CARDIOVASCULAR CVL A: HTN, r;o ischemia, hyper T from K  P:  Nitro Drip to drop MAP by 30%, she is chronic HTN Hold home meds for now , restart as indicated  Calcium stat trop  RENAL A:  ESRD on HD M/W/F  Hyperkalemia emergent P:   Renal consult for emergent HD  Check stat labs , just back Calcium, add bicarb, may need high dose 20 mg alb neb HD STAT If makes urine, lasix, dont think she does Any delays HD, then kayxlate d50 insulin  GASTROINTESTINAL A:  No apparent acute issues  P:   Monitor  Add ppi  HEMATOLOGIC A:  Chronic Anemia  P:  Follow cbc  Consider sub q hep  INFECTIOUS A:  No apparent infectious source  P:   Abx: NONE Follow temp curve pcxr overwhelmingly volume  ENDOCRINE A:    No acute issues  P:   Monitor BS on BMET   NEUROLOGIC A:   P:   RASS goal: n/a  Monitor  Limit sedating meds  Hold home meds -abilify /lyrica/flexeril/xanax  May need low dose fent on NIMV  Family updated: Husband and daughter at bedside , updated    Interdisciplinary Family Meeting v Palliative Care Meeting:    TODAY'S SUMMARY: 73 yo female with ESRD w/ missed HD x 4 d , hypoxic and fluid overload with pulmonary edema on BIPAP for PCCM to admit from ER. Renal consulted for emergent HD .   I have personally obtained a history, examined the patient, evaluated laboratory and imaging results, formulated the assessment and plan and placed orders. CRITICAL CARE: The patient is critically ill with multiple organ systems failure and requires high complexity decision making for assessment and support,  frequent evaluation and titration of therapies, application of advanced monitoring technologies and extensive interpretation of multiple databases. Critical Care Time devoted to patient care services described in this note is  30 minutes is mine soley and independent.    My comments   73 yo female with ESRD w/ missed HD x 4 d , hypoxic and fluid overload with pulmonary edema on BIPAP for PCCM to admit from ER. Renal consulted for emergent HD . Lavon Paganini. Titus Mould, MD, FACP Pgr: [facility]  [facility] NP-C  Pulmonary and [place] Pager: [phone]  03/12/2014, 12:01 PM

## 2014-03-12 NOTE — Progress Notes (Signed)
Pt signed off HD 30 mins early due to extreme cramping and weakness.  No c/o or problems after rinseback. Pt resting comfortably and vss.  RT present. Gave verbal report to D. Radford Pax, RN

## 2014-03-12 NOTE — Consult Note (Signed)
Renal Service Consult Note [facility]  [name] 03/12/2014 [place] Requesting Physician:  Dr Titus Mould  Reason for Consult:  ESRD pt with resp distress and hyperkalemia HPI: The patient is a 73 y.o. year-old w history of HTN and ESRD missed Fri hemodialysis and presents on Sunday with resp distress and K 7.3.  Asked to see for acute HD.   No CP, +cough, feels better on Bipap.  No fevers, chills, abd pain, n/v/d.   No jt pain or rash  Past Medical History  Past Medical History  Diagnosis Date  . Hyperlipidemia   . Normal cardiac stress test 12/24/2009    lexiscan, imaging normal  . ANEMIA NEC 03/31/2007    Qualifier: Diagnosis of  By: GRANDIS MD, [name]    . Diverticulitis   . IBS (irritable bowel syndrome)   . Thyroid disease   . Depression   . Chronic kidney disease     Hemo MWF  . GERD (gastroesophageal reflux disease)   . H/O hiatal hernia   . Arthritis   . RLS (restless legs syndrome)   . Tubular adenoma of colon 01/2008  . Seizures     20 04  . Constipation   . Sinus complaint   . Dialysis patient     kidney  . Renal disorder   . Adrenal mass   . Hypertension   . High cholesterol   . Back pain    Past Surgical History  Past Surgical History  Procedure Laterality Date  . Cardiac catheterization  2003    normal  . Cholecystectomy      Open mid line incision  . Frontal craniotomy  2002    indication = sinusitis  . Appendectomy    . Tubal ligation    . Abdominal hysterectomy    . Insertion of dialysis catheter      Left  . Revison of arteriovenous fistula  05/11/2012    Procedure: REVISON OF ARTERIOVENOUS FISTULA;  Surgeon: Elam Dutch, MD;  Location: [place];  Service: Vascular;  Laterality: Right;  . Av fistula placement Left 11/11/2012    Procedure: INSERTION OF ARTERIOVENOUS (AV) GORE-TEX GRAFT ARM;  Surgeon: Angelia Mould, MD;  Location: [place];  Service: Vascular;  Laterality: Left;  . Avgg removal Left  11/18/2012    Procedure: REMOVAL OF LEFT UPPER ARM ARTERIOVENOUS GORETEX GRAFT ([place]);  Surgeon: Angelia Mould, MD;  Location: [place];  Service: Vascular;  Laterality: Left;  . Patch angioplasty Left 11/18/2012    Procedure: PATCH ANGIOPLASTY;  Surgeon: Angelia Mould, MD;  Location: [place];  Service: Vascular;  Laterality: Left;  . Frontal cranialcity  2002   Family History  Family History  Problem Relation Age of Onset  . Thyroid cancer Mother   . Heart disease Father   . Hypertension Father   . Heart attack Father   . Breast cancer Sister   . Lupus Daughter    Social History  reports that she quit smoking about 4 months ago. Her smoking use included Cigarettes. She has a 5 pack-year smoking history. She does not have any smokeless tobacco history on file. She reports that she does not drink alcohol or use illicit drugs. Allergies  Allergies  Allergen Reactions  . Codeine Nausea And Vomiting  . Codeine Other (See Comments)    unknown  . Tuberculin Tests Hives    "blisters"  . Penicillins Rash    No problems breathing. Has tolerated omnicef in past without  issue  . Sulfamethoxazole Rash   Home medications Prior to Admission medications   Medication Sig Start Date End Date Taking? Authorizing Provider  allopurinol (ZYLOPRIM) 100 MG tablet Take 100 mg by mouth 4 (four) times a week. On non-dialysis days Tuesday, Thursday, Saturday, and Sunday   Yes Historical Provider, MD  amLODipine (NORVASC) 10 MG tablet Take 10 mg by mouth at bedtime.   Yes Historical Provider, MD  ARIPiprazole (ABILIFY) 2 MG tablet Take 1 mg by mouth every morning.   Yes Historical Provider, MD  aspirin EC 81 MG tablet Take 81 mg by mouth every morning.   Yes Historical Provider, MD  atorvastatin (LIPITOR) 40 MG tablet Take 1 tablet (40 mg total) by mouth daily. 10/07/13  Yes Jayce G Cook, DO  Calcium Acetate 667 MG TABS Take by mouth.  09/15/12  Yes Historical Provider, MD  cinacalcet (SENSIPAR) 30  MG tablet Take 30 mg by mouth at bedtime.   Yes Historical Provider, MD  cloNIDine (CATAPRES) 0.2 MG tablet Take 0.2 mg by mouth 2 (two) times daily.   Yes Historical Provider, MD  cyclobenzaprine (FLEXERIL) 5 MG tablet Take 5 mg by mouth 3 (three) times daily as needed for muscle spasms.   Yes Historical Provider, MD  fluticasone (FLONASE) 50 MCG/ACT nasal spray Place 2 sprays into both nostrils daily as needed for allergies or rhinitis.    Yes Historical Provider, MD  folic acid-vitamin b complex-vitamin c-selenium-zinc (DIALYVITE) 3 MG TABS tablet Take 1 tablet by mouth daily.   Yes Historical Provider, MD  ibuprofen (ADVIL,MOTRIN) 200 MG tablet Take 400 mg by mouth 2 (two) times daily.    Yes Historical Provider, MD  lidocaine-prilocaine (EMLA) cream Apply 1 application topically 3 (three) times a week. Use before dialysis on MWF   Yes Historical Provider, MD  loratadine (CLARITIN) 10 MG tablet Take 10 mg by mouth daily as needed for allergies.   Yes Historical Provider, MD  metoprolol succinate (TOPROL-XL) 100 MG 24 hr tablet Take 100 mg by mouth at bedtime. Take with or immediately following a meal.   Yes Historical Provider, MD  omeprazole (PRILOSEC) 20 MG capsule Take 20 mg by mouth 2 (two) times daily.   Yes Historical Provider, MD  pregabalin (LYRICA) 75 MG capsule Take 75 mg by mouth every evening.    Yes Historical Provider, MD  senna (SENOKOT) 8.6 MG tablet Take 2 tablets by mouth daily as needed. For constipation   Yes Historical Provider, MD  sevelamer carbonate (RENVELA) 800 MG tablet Take 2,400 mg by mouth 3 (three) times daily with meals.   Yes Historical Provider, MD   Liver Function Tests  Recent Labs Lab 03/12/14 1120  AST 27  ALT 17  ALKPHOS 141*  BILITOT 0.3  PROT 9.1*  ALBUMIN 3.4*   No results found for this basename: LIPASE, AMYLASE,  in the last 168 hours CBC  Recent Labs Lab 03/12/14 1120 03/12/14 1257  WBC 11.5* 12.9*  NEUTROABS 6.6 10.4*  HGB 13.9 13.0   HCT 41.3 39.7  MCV 90.6 90.6  PLT 237 678   Basic Metabolic Panel  Recent Labs Lab 03/12/14 1120  NA 135*  K 7.3*  CL 94*  CO2 18*  GLUCOSE 111*  BUN 68*  CREATININE 12.69*  CALCIUM 9.3    Filed Vitals:   03/12/14 1145 03/12/14 1200 03/12/14 1229 03/12/14 1245  BP: 171/68 127/68  127/63  Pulse:  75 69 65  Resp: 17 25 25 19   SpO2:  96% 96% 98%   Exam On BiPAP, alert No rash, cyanosis or gangrene Sclera anicteric, throat clear +JVD Bilat coarse insp rales 1/3 RRR difficult to hear HS over bipap Abd obese, soft, NTND 1+ pitting bilat pretib edema, no ulcer or gangrene Neuro is alert, Ox 3  HD; [place] [place] 3h 55min F180  86kg  2/2.25 [place]  LUA HeRO access   Heparin 4000  Prof 4 ARanesp 25 ug q week, Hectorol 2 mcg TIW  Assessment: 1 Resp distress / pulm edema - due to vol overload in ESRD pt, missed HD/ nonadherence 2 ESRD on HD 3 HTN on norvasc, clonidine, metoprolol at home 4 HPTH cont phoslo/renvela, sensipar 5 Psych on Abilify. Lyrica 6 Severe hyperkalemia - EKG changes not very bad   Plan - urgent HD today, see orders  Kelly Splinter MD (pgr) [phone]    ([phone] 03/12/2014, 1:44 PM

## 2014-03-12 NOTE — Progress Notes (Signed)
Had difficulty cannulating prior to HD. Both sites had clots.  Dr. Jonnie Finner used site rite to help cannulate. Several cannulations attempted w/ pt permission.  Durene Cal RN successfully cannulated. 15 1/4g needles used for venous site. bfr 300 due to high venous pressures. Pt stated "they always have trouble cannulating me. There is only one person at my clinic that can stick my access".

## 2014-03-12 NOTE — ED Notes (Signed)
Pt is in a gown on the monitor. EKG done and given to Dr. Venora Maples.

## 2014-03-13 ENCOUNTER — Inpatient Hospital Stay (HOSPITAL_COMMUNITY): Payer: Medicare Other

## 2014-03-13 DIAGNOSIS — J9601 Acute respiratory failure with hypoxia: Secondary | ICD-10-CM | POA: Diagnosis not present

## 2014-03-13 LAB — BASIC METABOLIC PANEL
Anion gap: 17 — ABNORMAL HIGH (ref 5–15)
BUN: 35 mg/dL — ABNORMAL HIGH (ref 6–23)
CALCIUM: 9.1 mg/dL (ref 8.4–10.5)
CO2: 27 meq/L (ref 19–32)
CREATININE: 8.76 mg/dL — AB (ref 0.50–1.10)
Chloride: 94 mEq/L — ABNORMAL LOW (ref 96–112)
GFR, EST AFRICAN AMERICAN: 5 mL/min — AB (ref >90)
GFR, EST NON AFRICAN AMERICAN: 4 mL/min — AB (ref >90)
Glucose, Bld: 70 mg/dL (ref 70–99)
Potassium: 4.6 mEq/L (ref 3.7–5.3)
SODIUM: 138 meq/L (ref 137–147)

## 2014-03-13 LAB — GLUCOSE, CAPILLARY
GLUCOSE-CAPILLARY: 87 mg/dL (ref 70–99)
GLUCOSE-CAPILLARY: 176 mg/dL — AB (ref 70–99)
Glucose-Capillary: 68 mg/dL — ABNORMAL LOW (ref 70–99)
Glucose-Capillary: 65 mg/dL — ABNORMAL LOW (ref 70–99)
Glucose-Capillary: 96 mg/dL (ref 70–99)

## 2014-03-13 LAB — HEPATITIS B SURFACE ANTIGEN: Hepatitis B Surface Ag: NEGATIVE

## 2014-03-13 LAB — CBC
HCT: 35.9 % — ABNORMAL LOW (ref 36.0–46.0)
Hemoglobin: 11.7 g/dL — ABNORMAL LOW (ref 12.0–15.0)
MCH: 29.1 pg (ref 26.0–34.0)
MCHC: 32.6 g/dL (ref 30.0–36.0)
MCV: 89.3 fL (ref 78.0–100.0)
PLATELETS: 165 10*3/uL (ref 150–400)
RBC: 4.02 MIL/uL (ref 3.87–5.11)
RDW: 15.7 % — AB (ref 11.5–15.5)
WBC: 7.7 10*3/uL (ref 4.0–10.5)

## 2014-03-13 MED ORDER — RENA-VITE PO TABS
1.0000 | ORAL_TABLET | Freq: Every day | ORAL | Status: DC
  Administered 2014-03-13: 1 via ORAL
  Filled 2014-03-13 (×2): qty 1

## 2014-03-13 MED ORDER — ASPIRIN EC 81 MG PO TBEC
81.0000 mg | DELAYED_RELEASE_TABLET | Freq: Every morning | ORAL | Status: DC
  Administered 2014-03-13 – 2014-03-14 (×2): 81 mg via ORAL
  Filled 2014-03-13 (×2): qty 1

## 2014-03-13 MED ORDER — IBUPROFEN 400 MG PO TABS
400.0000 mg | ORAL_TABLET | Freq: Two times a day (BID) | ORAL | Status: DC | PRN
  Administered 2014-03-14: 400 mg via ORAL
  Filled 2014-03-13 (×2): qty 1

## 2014-03-13 MED ORDER — CLONIDINE HCL 0.2 MG PO TABS
0.2000 mg | ORAL_TABLET | Freq: Two times a day (BID) | ORAL | Status: DC
  Administered 2014-03-13 – 2014-03-14 (×3): 0.2 mg via ORAL
  Filled 2014-03-13 (×4): qty 1

## 2014-03-13 MED ORDER — ALLOPURINOL 100 MG PO TABS
100.0000 mg | ORAL_TABLET | ORAL | Status: DC
Start: 2014-03-14 — End: 2014-03-14
  Administered 2014-03-14: 100 mg via ORAL
  Filled 2014-03-13: qty 1

## 2014-03-13 MED ORDER — SEVELAMER CARBONATE 800 MG PO TABS
2400.0000 mg | ORAL_TABLET | Freq: Three times a day (TID) | ORAL | Status: DC
  Administered 2014-03-14 (×2): 2400 mg via ORAL
  Filled 2014-03-13 (×4): qty 3

## 2014-03-13 MED ORDER — AMLODIPINE BESYLATE 10 MG PO TABS
10.0000 mg | ORAL_TABLET | Freq: Every day | ORAL | Status: DC
  Administered 2014-03-13: 10 mg via ORAL
  Filled 2014-03-13 (×3): qty 1

## 2014-03-13 MED ORDER — PREGABALIN 75 MG PO CAPS
75.0000 mg | ORAL_CAPSULE | Freq: Every day | ORAL | Status: DC
  Administered 2014-03-13: 75 mg via ORAL
  Filled 2014-03-13: qty 1

## 2014-03-13 MED ORDER — ATORVASTATIN CALCIUM 40 MG PO TABS
40.0000 mg | ORAL_TABLET | Freq: Every day | ORAL | Status: DC
  Administered 2014-03-13 – 2014-03-14 (×2): 40 mg via ORAL
  Filled 2014-03-13 (×2): qty 1

## 2014-03-13 MED ORDER — FLUTICASONE PROPIONATE 50 MCG/ACT NA SUSP: 2.0000 | Freq: Every day | NASAL | Status: DC | PRN

## 2014-03-13 MED ORDER — PANTOPRAZOLE SODIUM 40 MG PO TBEC
40.0000 mg | DELAYED_RELEASE_TABLET | Freq: Every day | ORAL | Status: DC
  Administered 2014-03-13: 40 mg via ORAL
  Filled 2014-03-13: qty 1

## 2014-03-13 MED ORDER — ARIPIPRAZOLE 2 MG PO TABS
1.0000 mg | ORAL_TABLET | Freq: Every morning | ORAL | Status: DC
  Administered 2014-03-13 – 2014-03-14 (×2): 1 mg via ORAL
  Filled 2014-03-13 (×2): qty 1

## 2014-03-13 MED ORDER — CINACALCET HCL 30 MG PO TABS
30.0000 mg | ORAL_TABLET | Freq: Every day | ORAL | Status: DC
  Administered 2014-03-13: 30 mg via ORAL
  Filled 2014-03-13 (×2): qty 1

## 2014-03-13 NOTE — H&P (Signed)
PULMONARY / CRITICAL CARE MEDICINE   Name: [name] MRN: [id number] DOB: 26-Dec-1940    ADMISSION DATE:  03/12/2014   REFERRING MD :  EDP  CHIEF COMPLAINT:  SOB   INITIAL PRESENTATION: 73 yo female with ESRD on HD M/W/F presented to ER resp distress found to have pulmonary edema after missing HD 2 days ago. Placed on BIPAP and Nitro drip . PCCM asked to admit   STUDIES:    SIGNIFICANT EVENTS: 10/11 STAT Renal consult >HD 10/12- resolved resp failure  SUBJECTIVE:no distress inchair  VITAL SIGNS: Temp:  [97.3 F (36.3 C)-98.6 F (37 C)] 97.7 F (36.5 C) (10/12 0917) Pulse Rate:  [63-87] 78 (10/12 1100) Resp:  [10-25] 14 (10/12 1100) BP: (105-170)/(50-74) 150/53 mmHg (10/12 1100) SpO2:  [91 %-100 %] 100 % (10/12 1100) FiO2 (%):  [40 %-90 %] 40 % (10/11 2100) Weight:  [86 kg (189 lb 9.5 oz)-90.8 kg (200 lb 2.8 oz)] 86 kg (189 lb 9.5 oz) (10/12 0500) HEMODYNAMICS:   VENTILATOR SETTINGS: Vent Mode:  [-]  FiO2 (%):  [40 %-90 %] 40 % INTAKE / OUTPUT:  Intake/Output Summary (Last 24 hours) at 03/13/14 1206 Last data filed at 03/13/14 1000  Gross per 24 hour  Intake   1170 ml  Output   3175 ml  Net  -2005 ml    PHYSICAL EXAMINATION: General:  Morbidly obese pt in chair no distress Neuro:  Alert and oriented, follows commands  HEENT:  Dry mucosa Cardiovascular:  S1 s 2 RRR , no m/r/g  Lungs:  Bibasilar crackles resolving to coarse Abdomen:  Soft , obese, NT , BS + Musculoskeletal:  Intact  Skin:  Intact   LABS:  CBC  Recent Labs Lab 03/12/14 1120 03/12/14 1257 03/13/14 0250  WBC 11.5* 12.9* 7.7  HGB 13.9 13.0 11.7*  HCT 41.3 39.7 35.9*  PLT 237 204 165   Coag's No results found for this basename: APTT, INR,  in the last 168 hours BMET  Recent Labs Lab 03/12/14 1120 03/12/14 1257 03/13/14 0250  NA 135* 135* 138  K 7.3* 7.5* 4.6  CL 94* 95* 94*  CO2 18* 20 27  BUN 68* 67* 35*  CREATININE 12.69* 12.88* 8.76*  GLUCOSE 111* 91 70    Electrolytes  Recent Labs Lab 03/12/14 1120 03/12/14 1257 03/13/14 0250  CALCIUM 9.3 9.0 9.1  MG  --  3.0*  --   PHOS  --  6.2*  --    Sepsis Markers  Recent Labs Lab 03/12/14 1132 03/12/14 1257  LATICACIDVEN 1.72 2.0  PROCALCITON  --  0.41   ABG  Recent Labs Lab 03/12/14 1222  PHART 7.348*  PCO2ART 40.5  PO2ART 55.0*   Liver Enzymes  Recent Labs Lab 03/12/14 1120 03/12/14 1257  AST 27 19  ALT 17 15  ALKPHOS 141* 124*  BILITOT 0.3 0.3  ALBUMIN 3.4* 3.2*   Cardiac Enzymes  Recent Labs Lab 03/12/14 1120 03/12/14 1257  TROPONINI <0.30 <0.30   Glucose  Recent Labs Lab 03/12/14 1542 03/12/14 2023 03/13/14 0710 03/13/14 0902  GLUCAP 133* 68* 65* 96    Imaging Dg Chest Portable 1 View  03/12/2014   CLINICAL DATA:  Shortness of breath, respiratory distress.  EXAM: PORTABLE CHEST - 1 VIEW  COMPARISON:  06/15/2012  FINDINGS: Left dialysis device is in place with the tip in the right atrium. Severe diffuse bilateral airspace opacities with low lung volumes, new since prior study. Heart is normal size. No effusions. No acute  bony abnormality.  IMPRESSION: Severe diffuse bilateral airspace disease with low lung volumes. Opacities could reflect edema or infection.   Electronically Signed   By: Rolm Baptise M.D.   On: 03/12/2014 12:26     ASSESSMENT / PLAN:  PULMONARY  A: Acute Hypoxic Respiratory Failure  Pulmonary edema  Smoker  P:   Control BP further HD per renal Major improved IS ambulation  CARDIOVASCULAR CVL A: HTN, r;o ischemia, hyper T from K  P:  Nitro Drip off  Will restart home norvasc, clondifine Consider dc tele  RENAL A:  ESRD on HD M/W/F  Hyperkalemia emergent P:   Per renal For HD today 3 liters  GASTROINTESTINAL A:  No apparent acute issues  P:   ppi to oral Start diet  HEMATOLOGIC A:  Chronic Anemia  P:  Follow cbc  Consider sub q hep to dc when walking  INFECTIOUS A:  No apparent infectious source   P:   Pct 0.41, will elevate slight in ESRD Clinically not infected  NEUROLOGIC A:  No distress or pain P:   srarted neuro home meds  Family updated: Husband and daughter at bedside , updated    Interdisciplinary Family Meeting v Palliative Care Meeting:    TODAY'S SUMMARY: to floor, hd today, home in am likely   I have personally obtained a history, examined the patient, evaluated laboratory and imaging results, formulated the assessment and plan and placed orders.  Lavon Paganini. Titus Mould, MD, FACP Pgr: [place] [facility]  Raylene Miyamoto NP-C  Pulmonary and [place] Pager: [phone]  03/13/2014, 12:06 PM

## 2014-03-13 NOTE — Progress Notes (Signed)
Inpatient Diabetes Program Recommendations  AACE/ADA: New Consensus Statement on Inpatient Glycemic Control (2013)  Target Ranges:  Prepandial:   less than 140 mg/dL      Peak postprandial:   less than 180 mg/dL (1-2 hours)      Critically ill patients:  140 - 180 mg/dL  Results for SHANEL, PRAZAK (MRN [id number]) as of 03/13/2014 10:52  Ref. Range 03/12/2014 15:42 03/12/2014 20:23 03/13/2014 07:10 03/13/2014 09:02  Glucose-Capillary Latest Range: 70-99 mg/dL 133 (H) 68 (L) 65 (L) 96   Consider DC insulin Q4.   Thank you  Raoul Pitch BSN, RN,CDE Inpatient Diabetes Coordinator [phone] (team pager)

## 2014-03-13 NOTE — Procedures (Signed)
I have personally attended this patient's dialysis session.   2 K bath (K 4.6) 3 hour treatment and will then be back on usual schedule of MWF Access Left HeRO Tight heparin Now on room air and comfortable.  Jamal Maes, MD [facility] [phone] Pager 03/13/2014, 2:47 PM

## 2014-03-13 NOTE — Significant Event (Signed)
Patient to be transfer to [phone]. Patient currently in dialysis. RN spoke with charge RN in [place] who was willing to take report. Patient will transfer there from HD. Patient's belongings taken to her new room. Patient's family (daughter) made aware of the transfer. Clarkson Rosselli, Therapist, sports.

## 2014-03-13 NOTE — Progress Notes (Signed)
Subjective:  S/P emergent HD yesterday Got a shortened treatment Had some cramping Feels much better now Still on bicarb drip  For treatment today to get back on schedule  Objective:    Vital signs in last 24 hours: Filed Vitals:   03/13/14 0700 03/13/14 0800 03/13/14 0900 03/13/14 0917  BP: 158/60 168/56 147/59   Pulse: 78 78 75   Temp:    97.7 F (36.5 C)  TempSrc:    Oral  Resp: 21 20 17    Weight:      SpO2: 97% 99% 99%    Weight change:   Intake/Output Summary (Last 24 hours) at 03/13/14 0958 Last data filed at 03/13/14 0900  Gross per 24 hour  Intake   1070 ml  Output   3175 ml  Net  -2105 ml    Physical Exam:  Blood pressure 147/59, pulse 75, temperature 97.7 F (36.5 C), temperature source Oral, resp. rate 17, weight 86 kg (189 lb 9.5 oz), SpO2 99.00%. Very comfortable On nasal cannula No JVD Lungs clear S1S2 No S3 Abdomen soft No edema LE's Access LUE +B/T (HeRO)   Recent Labs Lab 03/12/14 1120 03/12/14 1257 03/13/14 0250  NA 135* 135* 138  K 7.3* 7.5* 4.6  CL 94* 95* 94*  CO2 18* 20 27  GLUCOSE 111* 91 70  BUN 68* 67* 35*  CREATININE 12.69* 12.88* 8.76*  CALCIUM 9.3 9.0 9.1  PHOS  --  6.2*  --      Recent Labs Lab 03/12/14 1120 03/12/14 1257  AST 27 19  ALT 17 15  ALKPHOS 141* 124*  BILITOT 0.3 0.3  PROT 9.1* 8.5*  ALBUMIN 3.4* 3.2*   Recent Labs Lab 03/12/14 1120 03/12/14 1257 03/13/14 0250  WBC 11.5* 12.9* 7.7  NEUTROABS 6.6 10.4*  --   HGB 13.9 13.0 11.7*  HCT 41.3 39.7 35.9*  MCV 90.6 90.6 89.3  PLT 237 204 165    Recent Labs Lab 03/12/14 1120 03/12/14 1257  TROPONINI <0.30 <0.30     Recent Labs Lab 03/12/14 1542 03/12/14 2023 03/13/14 0710 03/13/14 0902  GLUCAP 133* 68* 65* 96     No results found for this basename: IRON, TIBC, TRANSFERRIN, FERRITIN,  in the last 168 hours  Studies/Results: Dg Chest Port 1 View  03/13/2014   CLINICAL DATA:  73 year old female with pulmonary edema and shortness  breath. Subsequent encounter.  EXAM: PORTABLE CHEST - 1 VIEW  COMPARISON:  03/12/2014.  FINDINGS: Left central line is in place with the tip in the region of the right atrium/cavoatrial junction.  No gross pneumothorax.  Interval improvement in the degree of aeration of the lung zones suggesting resolving pulmonary edema with pulmonary vascular congestion (most notable centrally) remaining.  Elevated right hemidiaphragm.  Calcified aorta.  Heart size top-normal.  IMPRESSION: Interval improvement in the degree of aeration of the lung zones suggesting resolving pulmonary edema with pulmonary vascular congestion (most notable centrally) remaining.  Left central line is in place with the tip in the region of the right atrium/cavoatrial junction.   Electronically Signed   By: Chauncey Cruel M.D.   On: 03/13/2014 07:32   Dg Chest Portable 1 View  03/12/2014   CLINICAL DATA:  Shortness of breath, respiratory distress.  EXAM: PORTABLE CHEST - 1 VIEW  COMPARISON:  06/15/2012  FINDINGS: Left dialysis device is in place with the tip in the right atrium. Severe diffuse bilateral airspace opacities with low lung volumes, new since prior study. Heart is normal size.  No effusions. No acute bony abnormality.  IMPRESSION: Severe diffuse bilateral airspace disease with low lung volumes. Opacities could reflect edema or infection.   Electronically Signed   By: Rolm Baptise M.D.   On: 03/12/2014 12:26   MEDICATIONS .  sodium bicarbonate  infusion 1000 mL 50 mL/hr at 03/13/14 0700   . antiseptic oral rinse  7 mL Mouth Rinse q12n4p  . chlorhexidine  15 mL Mouth Rinse BID  . [START ON 03/15/2014] darbepoetin (ARANESP) injection - DIALYSIS  25 mcg Intravenous Q Wed-HD  . doxercalciferol  2 mcg Intravenous Q M,W,F-HD  . heparin  4,000 Units Dialysis Once in dialysis  . heparin  5,000 Units Subcutaneous 3 times per day  . insulin aspart  0-9 Units Subcutaneous 6 times per day  . pantoprazole (PROTONIX) IV  40 mg Intravenous  Q24H  . sodium chloride  3 mL Intravenous Q12H   HD; [facility]WF [facility]  3h 31min F180 86kg 2/2.25 Bath LUA HeRO access Heparin 4000 Prof 4  Aranesp 25 ug q week, Hectorol 2 mcg TIW  ASSESSMENT/RECOMMENDATIONS 1. ESRD MWF [place]. Missed treatment on Friday (had to keep disabled niece, was unable to schedule a Saturday treatment) Presented with pulm edema and hyperkalemia. Plan 3 hour HD today to get on schedule. Stop IV bicarb. OK to transfer to the floor and could prob be d/c'd to home after HD. 2. HTN - was not started on any of her BP meds on admission.  Need to restart home meds. 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 Hectorol. 4. Psyche - usually on abilify. Needs restart of home meds. 5. Anemia - continue outpt Aranesp  Jamal Maes, MD [facility] [phone] Pager 03/13/2014, 9:58 AM

## 2014-03-14 DIAGNOSIS — N186 End stage renal disease: Secondary | ICD-10-CM

## 2014-03-14 DIAGNOSIS — J9601 Acute respiratory failure with hypoxia: Principal | ICD-10-CM

## 2014-03-14 LAB — RENAL FUNCTION PANEL
ALBUMIN: 2.9 g/dL — AB (ref 3.5–5.2)
Anion gap: 17 — ABNORMAL HIGH (ref 5–15)
BUN: 20 mg/dL (ref 6–23)
CHLORIDE: 93 meq/L — AB (ref 96–112)
CO2: 25 mEq/L (ref 19–32)
Calcium: 9.8 mg/dL (ref 8.4–10.5)
Creatinine, Ser: 6.33 mg/dL — ABNORMAL HIGH (ref 0.50–1.10)
GFR calc Af Amer: 7 mL/min — ABNORMAL LOW (ref >90)
GFR, EST NON AFRICAN AMERICAN: 6 mL/min — AB (ref >90)
Glucose, Bld: 85 mg/dL (ref 70–99)
POTASSIUM: 4 meq/L (ref 3.7–5.3)
Phosphorus: 4.5 mg/dL (ref 2.3–4.6)
SODIUM: 135 meq/L — AB (ref 137–147)

## 2014-03-14 LAB — GLUCOSE, CAPILLARY
GLUCOSE-CAPILLARY: 100 mg/dL — AB (ref 70–99)
Glucose-Capillary: 124 mg/dL — ABNORMAL HIGH (ref 70–99)
Glucose-Capillary: 88 mg/dL (ref 70–99)
Glucose-Capillary: 92 mg/dL (ref 70–99)

## 2014-03-14 NOTE — Progress Notes (Signed)
Subjective:  No complaints, feeling well, not getting binders with food  Objective: Vital signs in last 24 hours: Temp:  [97.7 F (36.5 C)-98.9 F (37.2 C)] 97.9 F (36.6 C) (10/13 0420) Pulse Rate:  [71-88] 80 (10/13 0420) Resp:  [11-24] 17 (10/13 0420) BP: (124-180)/(42-67) 132/42 mmHg (10/13 0420) SpO2:  [90 %-100 %] 97 % (10/13 0420) Weight:  [84.9 kg (187 lb 2.7 oz)-87.816 kg (193 lb 9.6 oz)] 87.816 kg (193 lb 9.6 oz) (10/12 2015) Weight change: -4.3 kg (-9 lb 7.7 oz)  Intake/Output from previous day: 10/12 0701 - 10/13 0700 In: 660 [P.O.:560; I.V.:100] Out: 1300 [Urine:300] Intake/Output this shift:   Lab Results:  Recent Labs  03/12/14 1257 03/13/14 0250  WBC 12.9* 7.7  HGB 13.0 11.7*  HCT 39.7 35.9*  PLT 204 165   BMET:  Recent Labs  03/12/14 1257 03/13/14 0250 03/14/14 0322  NA 135* 138 135*  K 7.5* 4.6 4.0  CL 95* 94* 93*  CO2 20 27 25   GLUCOSE 91 70 85  BUN 67* 35* 20  CREATININE 12.88* 8.76* 6.33*  CALCIUM 9.0 9.1 9.8  ALBUMIN 3.2*  --  2.9*   No results found for this basename: PTH,  in the last 72 hours [place] Studies: No results found for this basename: IRON, TIBC, TRANSFERRIN, FERRITIN,  in the last 72 hours  Studies/Results: Dg Chest Port 1 View  03/13/2014   CLINICAL DATA:  73 year old female with pulmonary edema and shortness breath. Subsequent encounter.  EXAM: PORTABLE CHEST - 1 VIEW  COMPARISON:  03/12/2014.  FINDINGS: Left central line is in place with the tip in the region of the right atrium/cavoatrial junction.  No gross pneumothorax.  Interval improvement in the degree of aeration of the lung zones suggesting resolving pulmonary edema with pulmonary vascular congestion (most notable centrally) remaining.  Elevated right hemidiaphragm.  Calcified aorta.  Heart size top-normal.  IMPRESSION: Interval improvement in the degree of aeration of the lung zones suggesting resolving pulmonary edema with pulmonary vascular congestion (most notable  centrally) remaining.  Left central line is in place with the tip in the region of the right atrium/cavoatrial junction.   Electronically Signed   By: Chauncey Cruel M.D.   On: 03/13/2014 07:32   EXAM: General appearance:  Alert, in no apparent distress Resp:  CTA without rales, rhonchi, or wheezes Cardio:  RRR without murmur or rub GI:  + BS, soft and nontender Extremities:  No edema   Access:  LUA HeRO with + bruit  HD: [facility]WF [facility]  3h 77min F180 86kg 2/2.25 Bath LUA HeRO access Heparin 4000 Prof 4  Aranesp 25 ug q week, Hectorol 2 mcg TIW  Assessment/Plan: 1. ESRD - HD on MWF @ [place], missed Tx on Fri (had to keep disabled niece, was unable to schedule a Saturday treatment), presented with pulmonary edema & hyperkalemia; K 4 s/p HD 10/11 & 12. 2. HTN/Volume - BP 132/42 on Amlodipine 10 mg qhs, Clonidine 0.2 mg bid; wt 87.8 kg s/p net UF 1 L yesterday. 3. Anemia - Hgb 11.7, Aranesp 25 mcg on Wed. 4. Sec HPT - Ca 9.8 (10.7 corrected), P 4.5; Hectorol 2 mcg, Sensipar 30 mg qhs, Renvela 3 with meals.  2Ca bath. 5. Psyche - now on Abilify.    LOS: 2 days   LYLES,CHARLES 03/14/2014,8:08 AM  I have seen and examined this patient and agree with plan and recommendations as above.  Pt should be fine for discharge to home, go to usual  HD as outpt tomorrow at [facility].  She has had a few doses of SSI during adm. No prior h/o DM. We can check A1C as outpt. No need for this at discharge. Jamal Maes B,MD 03/14/2014 10:12 AM

## 2014-03-14 NOTE — Discharge Summary (Signed)
Physician Discharge Summary  Patient ID: [name] MRN: [id number] DOB/AGE: 11/01/1940 73 y.o.  Admit date: 03/12/2014 Discharge date: 03/14/2014    Discharge Diagnoses:  Acute Hypoxic Respiratory Failure - resolved. Pulmonary edema - resolved. Tobacco use disorder Seasonal allergies Hypertension Hyperlipidemia ESRD on HD M/W/F  Hyperkalemia - resolved. Chronic Anemia Hyperglycemia - resolved. GERD Gout Anxiety Depressive disorder Low back pain                                                                      DISCHARGE PLAN BY DIAGNOSIS    Acute Hypoxic Respiratory Failure - resolved. Pulmonary edema - resolved. Tobacco use disorder Seasonal allergies Plan: Tobacco cessation. Continue outpatient loratidine.  Hypertension Hyperlipidemia Plan: Continue outpatient antihypertensive regimen.  ESRD on HD M/W/F  Hyperkalemia - resolved. Plan: Continue outpatient hemodialysis per routine schedule.  Chronic Anemia  Plan: Monitor CBC.  Hyperglycemia - resolved. Plan: Monitor CBG's at dialysis. If CBG's are elevated, follow up with PCP.  GERD Plan: Continue outpatient omeprazole.  Gout Plan: Continue outpatient allopurinol.  Anxiety Depressive disorder Low back pain Plan: Continue outpatient regimen.                DISCHARGE SUMMARY   [name] is a 73 y.o. y/o female with a PMH of ESRD on HD M/W/F, presented to [facility] ED on 10/11 for SOB.  She reported that she had missed her HD 2 days prior because she had to stay home and take care of a family member.  Since missing her treatment, she began to develop SOB that gradually worsened to the point that she had to go to ED.  On arrival to ED, she was found to be volume overloaded.  She had hypoxic respiratory failure and CXR at the time revealed pulmonary edema.  She was subsequently placed on BiPAP with improved oxygenation and decreased WOB. In addition, she was hypertensive and hyperkalemic requiring  emergent dialysis. A shortened treatment of emergent HD was performed that afternoon 10/11.  She had a follow up treatment on 10/12 to get back on her routine schedule of M/W/F. On 10/13, she was deemed safe for discharge home.   SIGNIFICANT DIAGNOSTIC STUDIES CXR 10/11 >>> pulmonary edema.  SIGNIFICANT EVENTS 10/11 STAT Renal consult >>> emergent HD. 10/12- resolved resp failure.  Additional HD session.  MICRO DATA  None  ANTIBIOTICS None  CONSULTS Nephrology  TUBES / LINES None  Discharge Exam: General: WDWN femela, resting in chair, in NAD. Neuro: A&O x 3, non-focal.  HEENT: [place]/AT. PERRL, sclerae anicteric. Cardiovascular: RRR, no M/R/G.  Lungs: Respirations even and unlabored.  CTA bilaterally, No W/R/R.  Abdomen: BS x 4, soft, NT/ND.  Musculoskeletal: No gross deformities, no edema.  Skin: Intact, warm, no rashes.   Filed Vitals:   03/13/14 1904 03/13/14 2015 03/14/14 0420 03/14/14 1051  BP: 163/60 146/43 132/42 145/56  Pulse: 88 81 80   Temp: 98.5 F (36.9 C) 98.4 F (36.9 C) 97.9 F (36.6 C) 98.1 F (36.7 C)  TempSrc: Oral Oral Oral Oral  Resp: $Remo'20 18 17   'npywv$ Height: $Rem'5\' 2"'cxsU$  (1.575 m)     Weight: 86.183 kg (190 lb) 87.816 kg (193 lb 9.6 oz)    SpO2: 100% 99% 97% 98%  Discharge Labs  BMET  Recent Labs Lab 03/12/14 1120 03/12/14 1257 03/13/14 0250 03/14/14 0322  NA 135* 135* 138 135*  K 7.3* 7.5* 4.6 4.0  CL 94* 95* 94* 93*  CO2 18* $Remov'20 27 25  'qpMWuQ$ GLUCOSE 111* 91 70 85  BUN 68* 67* 35* 20  CREATININE 12.69* 12.88* 8.76* 6.33*  CALCIUM 9.3 9.0 9.1 9.8  MG  --  3.0*  --   --   PHOS  --  6.2*  --  4.5    CBC  Recent Labs Lab 03/12/14 1120 03/12/14 1257 03/13/14 0250  HGB 13.9 13.0 11.7*  HCT 41.3 39.7 35.9*  WBC 11.5* 12.9* 7.7  PLT 237 204 165    Anti-Coagulation No results found for this basename: INR,  in the last 168 hours  Discharge Instructions   Call MD for:  difficulty breathing, headache or visual disturbances    Complete  by:  As directed      Call MD for:  extreme fatigue    Complete by:  As directed      Call MD for:  persistant dizziness or light-headedness    Complete by:  As directed      Call MD for:  redness, tenderness, or signs of infection (pain, swelling, redness, odor or green/yellow discharge around incision site)    Complete by:  As directed      Call MD for:  severe uncontrolled pain    Complete by:  As directed      Call MD for:  temperature >100.4    Complete by:  As directed      Diet - low sodium heart healthy    Complete by:  As directed      Increase activity slowly    Complete by:  As directed                 Follow-up Information   Follow up with Thersa Salt, DO. (As needed)    Specialty:  Family Medicine   Contact information:   [place] [id number] [phone]          Medication List         allopurinol 100 MG tablet  Commonly known as:  ZYLOPRIM  Take 100 mg by mouth 4 (four) times a week. On non-dialysis days Tuesday, Thursday, Saturday, and Sunday     amLODipine 10 MG tablet  Commonly known as:  NORVASC  Take 10 mg by mouth at bedtime.     ARIPiprazole 2 MG tablet  Commonly known as:  ABILIFY  Take 1 mg by mouth every morning.     aspirin EC 81 MG tablet  Take 81 mg by mouth every morning.     atorvastatin 40 MG tablet  Commonly known as:  LIPITOR  Take 1 tablet (40 mg total) by mouth daily.     cinacalcet 30 MG tablet  Commonly known as:  SENSIPAR  Take 30 mg by mouth at bedtime.     cloNIDine 0.2 MG tablet  Commonly known as:  CATAPRES  Take 0.2 mg by mouth 2 (two) times daily.     cyclobenzaprine 5 MG tablet  Commonly known as:  FLEXERIL  Take 5 mg by mouth 3 (three) times daily as needed for muscle spasms.     fluticasone 50 MCG/ACT nasal spray  Commonly known as:  FLONASE  Place 2 sprays into both nostrils daily as needed for allergies or rhinitis.     folic acid-vitamin  b complex-vitamin  c-selenium-zinc 3 MG Tabs tablet  Take 1 tablet by mouth daily.     ibuprofen 200 MG tablet  Commonly known as:  ADVIL,MOTRIN  Take 400 mg by mouth 2 (two) times daily.     lidocaine-prilocaine cream  Commonly known as:  EMLA  Apply 1 application topically 3 (three) times a week. Use before dialysis on MWF     loratadine 10 MG tablet  Commonly known as:  CLARITIN  Take 10 mg by mouth daily as needed for allergies.     metoprolol succinate 100 MG 24 hr tablet  Commonly known as:  TOPROL-XL  Take 100 mg by mouth at bedtime. Take with or immediately following a meal.     omeprazole 20 MG capsule  Commonly known as:  PRILOSEC  Take 20 mg by mouth 2 (two) times daily.     pregabalin 75 MG capsule  Commonly known as:  LYRICA  Take 75 mg by mouth every evening.     senna 8.6 MG tablet  Commonly known as:  SENOKOT  Take 2 tablets by mouth daily as needed. For constipation     sevelamer carbonate 800 MG tablet  Commonly known as:  RENVELA  Take 2,400 mg by mouth 3 (three) times daily with meals.          Disposition: Home.  Discharged Condition: [name] has met maximum benefit of inpatient care and is medically stable and cleared for discharge.  Patient is pending follow up as above.      Time spent on disposition:  Greater than 45 minutes.   Montey Hora, [place] [facility] Pgr: [phone]  or [phone]   Baltazar Apo, MD, PhD 03/15/2014, 10:24 AM [place] [facility] [phone] or if no answer [phone]

## 2014-03-14 NOTE — Progress Notes (Signed)
Patient Discharge:  Disposition: Pt discharged home.   Education: Pt educated on diagnosis,medications,  follow up appointment and discharge instructions. Pt verbalized understanding.  IV: Removed  Telemetry: Discontinued  Follow-up appointments: Discussed with Pt  Prescriptions: N/A  Transportation: Pt transported home via car by family friend  Belongings:All belongings taken with pt

## 2014-03-19 ENCOUNTER — Other Ambulatory Visit: Payer: Self-pay | Admitting: Family Medicine

## 2014-03-26 ENCOUNTER — Other Ambulatory Visit: Payer: Self-pay | Admitting: Family Medicine

## 2014-04-01 DIAGNOSIS — N186 End stage renal disease: Secondary | ICD-10-CM | POA: Diagnosis not present

## 2014-04-03 ENCOUNTER — Encounter (HOSPITAL_COMMUNITY): Payer: Self-pay | Admitting: Emergency Medicine

## 2014-04-03 DIAGNOSIS — D631 Anemia in chronic kidney disease: Secondary | ICD-10-CM | POA: Diagnosis not present

## 2014-04-03 DIAGNOSIS — N2581 Secondary hyperparathyroidism of renal origin: Secondary | ICD-10-CM | POA: Diagnosis not present

## 2014-04-03 DIAGNOSIS — N186 End stage renal disease: Secondary | ICD-10-CM | POA: Diagnosis not present

## 2014-04-11 DIAGNOSIS — N186 End stage renal disease: Secondary | ICD-10-CM | POA: Diagnosis not present

## 2014-04-11 DIAGNOSIS — Y841 Kidney dialysis as the cause of abnormal reaction of the patient, or of later complication, without mention of misadventure at the time of the procedure: Secondary | ICD-10-CM | POA: Diagnosis not present

## 2014-04-11 DIAGNOSIS — T859XXA Unspecified complication of internal prosthetic device, implant and graft, initial encounter: Secondary | ICD-10-CM | POA: Diagnosis not present

## 2014-04-24 ENCOUNTER — Ambulatory Visit: Payer: Self-pay | Admitting: Vascular Surgery

## 2014-04-24 ENCOUNTER — Ambulatory Visit: Payer: Medicare Other | Admitting: Adult Health

## 2014-04-24 DIAGNOSIS — N186 End stage renal disease: Secondary | ICD-10-CM | POA: Diagnosis not present

## 2014-04-24 DIAGNOSIS — Z885 Allergy status to narcotic agent status: Secondary | ICD-10-CM | POA: Diagnosis not present

## 2014-04-24 DIAGNOSIS — Z882 Allergy status to sulfonamides status: Secondary | ICD-10-CM | POA: Diagnosis not present

## 2014-04-24 DIAGNOSIS — Z7982 Long term (current) use of aspirin: Secondary | ICD-10-CM | POA: Diagnosis not present

## 2014-04-24 DIAGNOSIS — E780 Pure hypercholesterolemia: Secondary | ICD-10-CM | POA: Diagnosis not present

## 2014-04-24 DIAGNOSIS — Z880 Allergy status to penicillin: Secondary | ICD-10-CM | POA: Diagnosis not present

## 2014-04-24 DIAGNOSIS — T82858A Stenosis of vascular prosthetic devices, implants and grafts, initial encounter: Secondary | ICD-10-CM | POA: Diagnosis not present

## 2014-04-24 DIAGNOSIS — Z720 Tobacco use: Secondary | ICD-10-CM | POA: Diagnosis not present

## 2014-04-24 DIAGNOSIS — I871 Compression of vein: Secondary | ICD-10-CM | POA: Diagnosis not present

## 2014-04-24 DIAGNOSIS — Z9889 Other specified postprocedural states: Secondary | ICD-10-CM | POA: Diagnosis not present

## 2014-04-24 DIAGNOSIS — Z79899 Other long term (current) drug therapy: Secondary | ICD-10-CM | POA: Diagnosis not present

## 2014-04-24 DIAGNOSIS — T82868D Thrombosis of vascular prosthetic devices, implants and grafts, subsequent encounter: Secondary | ICD-10-CM | POA: Diagnosis not present

## 2014-04-30 ENCOUNTER — Other Ambulatory Visit: Payer: Self-pay | Admitting: Family Medicine

## 2014-05-01 DIAGNOSIS — Z992 Dependence on renal dialysis: Secondary | ICD-10-CM | POA: Diagnosis not present

## 2014-05-01 DIAGNOSIS — N186 End stage renal disease: Secondary | ICD-10-CM | POA: Diagnosis not present

## 2014-05-03 DIAGNOSIS — D631 Anemia in chronic kidney disease: Secondary | ICD-10-CM | POA: Diagnosis not present

## 2014-05-03 DIAGNOSIS — N186 End stage renal disease: Secondary | ICD-10-CM | POA: Diagnosis not present

## 2014-05-03 DIAGNOSIS — N2581 Secondary hyperparathyroidism of renal origin: Secondary | ICD-10-CM | POA: Diagnosis not present

## 2014-05-03 DIAGNOSIS — Z23 Encounter for immunization: Secondary | ICD-10-CM | POA: Diagnosis not present

## 2014-05-03 DIAGNOSIS — D509 Iron deficiency anemia, unspecified: Secondary | ICD-10-CM | POA: Diagnosis not present

## 2014-05-21 ENCOUNTER — Other Ambulatory Visit: Payer: Self-pay | Admitting: Family Medicine

## 2014-06-01 DIAGNOSIS — Z992 Dependence on renal dialysis: Secondary | ICD-10-CM | POA: Diagnosis not present

## 2014-06-01 DIAGNOSIS — N186 End stage renal disease: Secondary | ICD-10-CM | POA: Diagnosis not present

## 2014-06-03 DIAGNOSIS — N2581 Secondary hyperparathyroidism of renal origin: Secondary | ICD-10-CM | POA: Diagnosis not present

## 2014-06-03 DIAGNOSIS — N186 End stage renal disease: Secondary | ICD-10-CM | POA: Diagnosis not present

## 2014-06-03 DIAGNOSIS — D631 Anemia in chronic kidney disease: Secondary | ICD-10-CM | POA: Diagnosis not present

## 2014-06-03 DIAGNOSIS — D509 Iron deficiency anemia, unspecified: Secondary | ICD-10-CM | POA: Diagnosis not present

## 2014-06-05 DIAGNOSIS — N186 End stage renal disease: Secondary | ICD-10-CM | POA: Diagnosis not present

## 2014-06-05 DIAGNOSIS — D509 Iron deficiency anemia, unspecified: Secondary | ICD-10-CM | POA: Diagnosis not present

## 2014-06-05 DIAGNOSIS — N2581 Secondary hyperparathyroidism of renal origin: Secondary | ICD-10-CM | POA: Diagnosis not present

## 2014-06-05 DIAGNOSIS — D631 Anemia in chronic kidney disease: Secondary | ICD-10-CM | POA: Diagnosis not present

## 2014-06-07 DIAGNOSIS — N186 End stage renal disease: Secondary | ICD-10-CM | POA: Diagnosis not present

## 2014-06-07 DIAGNOSIS — N2581 Secondary hyperparathyroidism of renal origin: Secondary | ICD-10-CM | POA: Diagnosis not present

## 2014-06-07 DIAGNOSIS — D509 Iron deficiency anemia, unspecified: Secondary | ICD-10-CM | POA: Diagnosis not present

## 2014-06-07 DIAGNOSIS — D631 Anemia in chronic kidney disease: Secondary | ICD-10-CM | POA: Diagnosis not present

## 2014-06-09 DIAGNOSIS — D509 Iron deficiency anemia, unspecified: Secondary | ICD-10-CM | POA: Diagnosis not present

## 2014-06-09 DIAGNOSIS — N186 End stage renal disease: Secondary | ICD-10-CM | POA: Diagnosis not present

## 2014-06-09 DIAGNOSIS — N2581 Secondary hyperparathyroidism of renal origin: Secondary | ICD-10-CM | POA: Diagnosis not present

## 2014-06-09 DIAGNOSIS — D631 Anemia in chronic kidney disease: Secondary | ICD-10-CM | POA: Diagnosis not present

## 2014-06-12 DIAGNOSIS — N186 End stage renal disease: Secondary | ICD-10-CM | POA: Diagnosis not present

## 2014-06-12 DIAGNOSIS — N2581 Secondary hyperparathyroidism of renal origin: Secondary | ICD-10-CM | POA: Diagnosis not present

## 2014-06-12 DIAGNOSIS — D509 Iron deficiency anemia, unspecified: Secondary | ICD-10-CM | POA: Diagnosis not present

## 2014-06-12 DIAGNOSIS — D631 Anemia in chronic kidney disease: Secondary | ICD-10-CM | POA: Diagnosis not present

## 2014-06-14 DIAGNOSIS — N2581 Secondary hyperparathyroidism of renal origin: Secondary | ICD-10-CM | POA: Diagnosis not present

## 2014-06-14 DIAGNOSIS — D509 Iron deficiency anemia, unspecified: Secondary | ICD-10-CM | POA: Diagnosis not present

## 2014-06-14 DIAGNOSIS — D631 Anemia in chronic kidney disease: Secondary | ICD-10-CM | POA: Diagnosis not present

## 2014-06-14 DIAGNOSIS — N186 End stage renal disease: Secondary | ICD-10-CM | POA: Diagnosis not present

## 2014-06-17 DIAGNOSIS — D631 Anemia in chronic kidney disease: Secondary | ICD-10-CM | POA: Diagnosis not present

## 2014-06-17 DIAGNOSIS — N2581 Secondary hyperparathyroidism of renal origin: Secondary | ICD-10-CM | POA: Diagnosis not present

## 2014-06-17 DIAGNOSIS — N186 End stage renal disease: Secondary | ICD-10-CM | POA: Diagnosis not present

## 2014-06-17 DIAGNOSIS — D509 Iron deficiency anemia, unspecified: Secondary | ICD-10-CM | POA: Diagnosis not present

## 2014-06-19 DIAGNOSIS — D631 Anemia in chronic kidney disease: Secondary | ICD-10-CM | POA: Diagnosis not present

## 2014-06-19 DIAGNOSIS — D509 Iron deficiency anemia, unspecified: Secondary | ICD-10-CM | POA: Diagnosis not present

## 2014-06-19 DIAGNOSIS — N186 End stage renal disease: Secondary | ICD-10-CM | POA: Diagnosis not present

## 2014-06-19 DIAGNOSIS — N2581 Secondary hyperparathyroidism of renal origin: Secondary | ICD-10-CM | POA: Diagnosis not present

## 2014-06-21 DIAGNOSIS — D631 Anemia in chronic kidney disease: Secondary | ICD-10-CM | POA: Diagnosis not present

## 2014-06-21 DIAGNOSIS — N2581 Secondary hyperparathyroidism of renal origin: Secondary | ICD-10-CM | POA: Diagnosis not present

## 2014-06-21 DIAGNOSIS — D509 Iron deficiency anemia, unspecified: Secondary | ICD-10-CM | POA: Diagnosis not present

## 2014-06-21 DIAGNOSIS — N186 End stage renal disease: Secondary | ICD-10-CM | POA: Diagnosis not present

## 2014-06-22 ENCOUNTER — Encounter: Payer: Self-pay | Admitting: Family Medicine

## 2014-06-22 ENCOUNTER — Ambulatory Visit (INDEPENDENT_AMBULATORY_CARE_PROVIDER_SITE_OTHER): Payer: Medicare Other | Admitting: Family Medicine

## 2014-06-22 VITALS — BP 175/56 | HR 66 | Temp 98.1°F | Ht 62.0 in | Wt 191.8 lb

## 2014-06-22 DIAGNOSIS — M545 Low back pain: Secondary | ICD-10-CM

## 2014-06-22 DIAGNOSIS — M549 Dorsalgia, unspecified: Secondary | ICD-10-CM | POA: Insufficient documentation

## 2014-06-22 MED ORDER — TRAMADOL HCL 50 MG PO TABS: 50.0000 mg | ORAL_TABLET | Freq: Three times a day (TID) | ORAL | Status: DC | PRN

## 2014-06-22 MED ORDER — METHOCARBAMOL 500 MG PO TABS: 500.0000 mg | ORAL_TABLET | Freq: Three times a day (TID) | ORAL | Status: DC | PRN

## 2014-06-22 NOTE — Assessment & Plan Note (Signed)
Most likely multifactorial with acute muscle spasm in piriformis and paraspinal muscles (spinalis feels in spasm).  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  Could have acute herniated disc in addition  - Recommend Skelaxin, Tramadol, heat to area (out of acute phase), and stretches/back exercises  - Hip X-rays to r/o concurrent Hip OA - F/U in 2 weeks to see how doing

## 2014-06-22 NOTE — Progress Notes (Signed)
[name] is a 74 y.o. female who presents today for right low back/hip pain.  Low back/hip pain - Ongoing now for about 3-4 weeks, happened after twisting to pick something up but denies acute radiculitis, paresthesias, or popping sensation in back.  She started to have worsening pain in this low back region, especially on the R which started to radiate into the lateral hip and into the anterior thigh.  She had leftover oxycodone from her previous injury to her arm which did help with the pain for several hours.  Tried tylenol without success, and unable to take NSAIDs due to ESRD.  Pain worse with movement or bending over.  Pt denies any current bowel/bladder problems, fever, chills, unintentional weight loss, night time awakenings secondary to pain, weakness in one or both legs    Past Medical History  Diagnosis Date  . Hyperlipidemia   . Normal cardiac stress test 12/24/2009    lexiscan, imaging normal  . 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 Diverticulitis   . IBS (irritable bowel syndrome)   . Thyroid disease   . Depression   . Chronic kidney disease     Hemo MWF  . GERD (gastroesophageal reflux disease)   . H/O hiatal hernia   . Arthritis   . RLS (restless legs syndrome)   . Tubular adenoma of colon 01/2008  . Seizures     2004  . Constipation   . Sinus complaint   . Dialysis patient     kidney  . Renal disorder   . Adrenal mass   . Hypertension   . High cholesterol   . Back pain     History  Smoking status  . Former Smoker -- 0.10 packs/day for 50 years  . Types: Cigarettes  . Quit date: 11/08/2013  Smokeless tobacco  . Not on file    Comment: pt states she smokes "1" cig per day    Family History  Problem Relation Age of Onset  . Thyroid cancer Mother   . Heart disease Father   . Hypertension Father   . Heart attack Father   . Breast cancer Sister   . Lupus Daughter     Current Outpatient Prescriptions on File  Prior to Visit  Medication Sig Dispense Refill  . ABILIFY 2 MG tablet TAKE ONE-HALF TABLET BY MOUTH ONCE DAILY 90 tablet 0  . allopurinol (ZYLOPRIM) 100 MG tablet Take 100 mg by mouth 4 (four) times a week. On non-dialysis days Tuesday, Thursday, Saturday, and Sunday    . allopurinol (ZYLOPRIM) 100 MG tablet TAKE ONE TABLET BY MOUTH ONCE DAILY ON THE DAYS FOLLOWING DIALYSIS (TUESDAY, THURSDAYS, AND SATURDAYS) 30 tablet 0  . amLODipine (NORVASC) 10 MG tablet Take 10 mg by mouth at bedtime.    Marland Kitchen amLODipine (NORVASC) 10 MG tablet TAKE ONE TABLET BY MOUTH ONCE DAILY 90 tablet 0  . amLODipine (NORVASC) 10 MG tablet TAKE ONE TABLET BY MOUTH ONCE DAILY 90 tablet 0  . ARIPiprazole (ABILIFY) 2 MG tablet Take 1 mg by mouth every morning.    Marland Kitchen aspirin EC 81 MG tablet Take 81 mg by mouth every morning.    Marland Kitchen atorvastatin (LIPITOR) 40 MG tablet Take 1 tablet (40 mg total) by mouth daily. 90 tablet 3  . cinacalcet (SENSIPAR) 30 MG tablet Take 30 mg by mouth at bedtime.    . cloNIDine (CATAPRES) 0.2 MG tablet Take 0.2 mg by  mouth 2 (two) times daily.    . cyclobenzaprine (FLEXERIL) 5 MG tablet Take 5 mg by mouth 3 (three) times daily as needed for muscle spasms.    . fluticasone (FLONASE) 50 MCG/ACT nasal spray Place 2 sprays into both nostrils daily as needed for allergies or rhinitis.     . folic acid-vitamin b complex-vitamin c-selenium-zinc (DIALYVITE) 3 MG TABS tablet Take 1 tablet by mouth daily.    Marland Kitchen ibuprofen (ADVIL,MOTRIN) 200 MG tablet Take 400 mg by mouth 2 (two) times daily.     Marland Kitchen lidocaine-prilocaine (EMLA) cream Apply 1 application topically 3 (three) times a week. Use before dialysis on MWF    . loratadine (CLARITIN) 10 MG tablet Take 10 mg by mouth daily as needed for allergies.    . metoprolol succinate (TOPROL-XL) 100 MG 24 hr tablet Take 100 mg by mouth at bedtime. Take with or immediately following a meal.    . omeprazole (PRILOSEC) 20 MG capsule Take 20 mg by mouth 2 (two) times daily.    .  pregabalin (LYRICA) 75 MG capsule Take 75 mg by mouth every evening.     . senna (SENOKOT) 8.6 MG tablet Take 2 tablets by mouth daily as needed. For constipation    . sevelamer carbonate (RENVELA) 800 MG tablet Take 2,400 mg by mouth 3 (three) times daily with meals.     No current facility-administered medications on file prior to visit.    ROS: Per HPI.  All other systems reviewed and are negative.   Physical Exam Filed Vitals:   06/22/14 1456  BP: 175/56  Pulse: 66  Temp: 98.1 F (36.7 C)    Physical Examination: Gen: NAD HEENT: [place]/AT, EOMI B/L  Resp: No respiratory distress Hip/Back Exam: ROM IR: 80 Deg, ER: 80 Deg, Flexion: 120 Deg, Extension: 100 Deg, Abduction: 45 Deg, Adduction: 45 Deg Strength IR: 5/5, ER: 5/5, Flexion: 5/5, Extension: 5/5, Abduction: 5/5, Adduction: 5/5 Pelvic alignment unremarkable to inspection and palpation. Standing hip rotation and gait without trendelenburg / unsteadiness. Greater trochanter without tenderness to palpation. + tenderness over R piriformis with decreased IR of entire hip joint  + SI joint tenderness and normal minimal SI movement. + Gaenslen test  + FABER on R with pain to lateral hip/back area  Negative SLR on R side   Neurovascular intact B/L LE w/o decreased sensation, DTR + 2, and MS 5/5   >50% of the 25 minutes was spent in direct discussion with the pt in regards to her management and treatment options

## 2014-06-23 DIAGNOSIS — D509 Iron deficiency anemia, unspecified: Secondary | ICD-10-CM | POA: Diagnosis not present

## 2014-06-23 DIAGNOSIS — D631 Anemia in chronic kidney disease: Secondary | ICD-10-CM | POA: Diagnosis not present

## 2014-06-23 DIAGNOSIS — N2581 Secondary hyperparathyroidism of renal origin: Secondary | ICD-10-CM | POA: Diagnosis not present

## 2014-06-23 DIAGNOSIS — N186 End stage renal disease: Secondary | ICD-10-CM | POA: Diagnosis not present

## 2014-06-25 ENCOUNTER — Other Ambulatory Visit: Payer: Self-pay | Admitting: Family Medicine

## 2014-06-25 MED ORDER — CYCLOBENZAPRINE HCL 5 MG PO TABS: 5.0000 mg | ORAL_TABLET | Freq: Three times a day (TID) | ORAL | Status: DC | PRN

## 2014-06-25 NOTE — Addendum Note (Signed)
Addended by: Kennith Maes R on: 06/25/2014 10:22 AM   Modules accepted: Orders

## 2014-06-26 DIAGNOSIS — N186 End stage renal disease: Secondary | ICD-10-CM | POA: Diagnosis not present

## 2014-06-26 DIAGNOSIS — D509 Iron deficiency anemia, unspecified: Secondary | ICD-10-CM | POA: Diagnosis not present

## 2014-06-26 DIAGNOSIS — N2581 Secondary hyperparathyroidism of renal origin: Secondary | ICD-10-CM | POA: Diagnosis not present

## 2014-06-26 DIAGNOSIS — D631 Anemia in chronic kidney disease: Secondary | ICD-10-CM | POA: Diagnosis not present

## 2014-06-28 DIAGNOSIS — D631 Anemia in chronic kidney disease: Secondary | ICD-10-CM | POA: Diagnosis not present

## 2014-06-28 DIAGNOSIS — N186 End stage renal disease: Secondary | ICD-10-CM | POA: Diagnosis not present

## 2014-06-28 DIAGNOSIS — N2581 Secondary hyperparathyroidism of renal origin: Secondary | ICD-10-CM | POA: Diagnosis not present

## 2014-06-28 DIAGNOSIS — D509 Iron deficiency anemia, unspecified: Secondary | ICD-10-CM | POA: Diagnosis not present

## 2014-06-30 DIAGNOSIS — D509 Iron deficiency anemia, unspecified: Secondary | ICD-10-CM | POA: Diagnosis not present

## 2014-06-30 DIAGNOSIS — N2581 Secondary hyperparathyroidism of renal origin: Secondary | ICD-10-CM | POA: Diagnosis not present

## 2014-06-30 DIAGNOSIS — N186 End stage renal disease: Secondary | ICD-10-CM | POA: Diagnosis not present

## 2014-06-30 DIAGNOSIS — D631 Anemia in chronic kidney disease: Secondary | ICD-10-CM | POA: Diagnosis not present

## 2014-07-02 DIAGNOSIS — N186 End stage renal disease: Secondary | ICD-10-CM | POA: Diagnosis not present

## 2014-07-02 DIAGNOSIS — Z992 Dependence on renal dialysis: Secondary | ICD-10-CM | POA: Diagnosis not present

## 2014-07-03 DIAGNOSIS — D631 Anemia in chronic kidney disease: Secondary | ICD-10-CM | POA: Diagnosis not present

## 2014-07-03 DIAGNOSIS — N186 End stage renal disease: Secondary | ICD-10-CM | POA: Diagnosis not present

## 2014-07-03 DIAGNOSIS — N2581 Secondary hyperparathyroidism of renal origin: Secondary | ICD-10-CM | POA: Diagnosis not present

## 2014-07-04 ENCOUNTER — Ambulatory Visit: Payer: Medicare Other | Admitting: Family Medicine

## 2014-07-05 DIAGNOSIS — N186 End stage renal disease: Secondary | ICD-10-CM | POA: Diagnosis not present

## 2014-07-05 DIAGNOSIS — D631 Anemia in chronic kidney disease: Secondary | ICD-10-CM | POA: Diagnosis not present

## 2014-07-05 DIAGNOSIS — N2581 Secondary hyperparathyroidism of renal origin: Secondary | ICD-10-CM | POA: Diagnosis not present

## 2014-07-07 DIAGNOSIS — N186 End stage renal disease: Secondary | ICD-10-CM | POA: Diagnosis not present

## 2014-07-07 DIAGNOSIS — D631 Anemia in chronic kidney disease: Secondary | ICD-10-CM | POA: Diagnosis not present

## 2014-07-07 DIAGNOSIS — N2581 Secondary hyperparathyroidism of renal origin: Secondary | ICD-10-CM | POA: Diagnosis not present

## 2014-07-10 DIAGNOSIS — N186 End stage renal disease: Secondary | ICD-10-CM | POA: Diagnosis not present

## 2014-07-10 DIAGNOSIS — D631 Anemia in chronic kidney disease: Secondary | ICD-10-CM | POA: Diagnosis not present

## 2014-07-10 DIAGNOSIS — N2581 Secondary hyperparathyroidism of renal origin: Secondary | ICD-10-CM | POA: Diagnosis not present

## 2014-07-12 DIAGNOSIS — D631 Anemia in chronic kidney disease: Secondary | ICD-10-CM | POA: Diagnosis not present

## 2014-07-12 DIAGNOSIS — N2581 Secondary hyperparathyroidism of renal origin: Secondary | ICD-10-CM | POA: Diagnosis not present

## 2014-07-12 DIAGNOSIS — N186 End stage renal disease: Secondary | ICD-10-CM | POA: Diagnosis not present

## 2014-07-13 ENCOUNTER — Encounter: Payer: Self-pay | Admitting: Adult Health

## 2014-07-13 ENCOUNTER — Ambulatory Visit (INDEPENDENT_AMBULATORY_CARE_PROVIDER_SITE_OTHER): Payer: Medicare Other | Admitting: Adult Health

## 2014-07-13 VITALS — BP 136/60 | HR 68 | Ht 64.0 in | Wt 180.0 lb

## 2014-07-13 DIAGNOSIS — Z720 Tobacco use: Secondary | ICD-10-CM | POA: Diagnosis not present

## 2014-07-13 DIAGNOSIS — Z87891 Personal history of nicotine dependence: Secondary | ICD-10-CM | POA: Diagnosis not present

## 2014-07-13 DIAGNOSIS — M21371 Foot drop, right foot: Secondary | ICD-10-CM

## 2014-07-13 DIAGNOSIS — R159 Full incontinence of feces: Secondary | ICD-10-CM | POA: Diagnosis not present

## 2014-07-13 DIAGNOSIS — F17200 Nicotine dependence, unspecified, uncomplicated: Secondary | ICD-10-CM

## 2014-07-13 NOTE — Patient Instructions (Signed)
Overall you are doing well.  Continue to monitor  your gait and balance   If your symptoms worsen please let us know.  Please take part in the smoking cessation program at [facility]- Call [phone]

## 2014-07-13 NOTE — Progress Notes (Signed)
I have read the note, and I agree with the clinical assessment and plan.  Jayliah Benett KEITH   

## 2014-07-13 NOTE — Progress Notes (Addendum)
PATIENT: [name] DOB: 06-09-1940  REASON FOR VISIT: follow up- incontinence of bowels, right foot drop HISTORY FROM: patient  HISTORY OF PRESENT ILLNESS:  [name] is a 74 year old female with a history of bowel incontinence. She returns today for follow-up. Patient had a lumbar MRI that showed disc protrusion with some stenosis- she was referred to orthopedic surgeon. She reports that she went there but they did not see any need for her to be there? The patient states that she has only had 1 episode of bowel incontinence in the last 3 months. She contributes that to a stressful situation. the patient is also on dialysis -was hospitalized in October due complications from a missed dialysis treatment. She original came in for a right foot drop and was wearing an AFO brace. She states that she is no longer wearing it- she states that a "miracle" has happened and her nerves in that foot as been healed and no longer needs the AFO brace. She states that she now has increased sensation in that foot than she did before. She states at first she was able to wiggle her toes and then she could rotate her ankle. She ambulates without assistance and denies any falls. She states that sometimes she is unsteady. 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 Patient does smoke cigarettes- she states that she has tried to quit but has been unsuccessful- she would like to try something that would help her.   HISTORY 11/10/13 ([place]): States intermittent episodes of bowel incontinence for the past few months but recently has started to occur more frequently, in the past few days it is occuring daily. Has no warning, is unaware until after the BM happens. Reports a chronic history of urinary incontinence (ongoing for years) but more lately occuring more frequently, again comes without warning. Notes some paresthesias, pain/burning sensation in the  distal lower extremities. She denies any saddle anesthesia. No recent back trauma, no falls. Notes some mild lower back pain.  [name] returns for follow up of continued weakness in her right foot with a noted foot drop. Since last visit she has had am EMG/NCS, MRI of thoracic spine and has been fitted for a right AFO. MRI thoracic spine was unremarkable.  Notes good improvement in ambulation with addition of AFO. Continues to work with PT though will temporarily stop due to ongoing medical concerns with her dialysis. No acute concerns at this time  REVIEW OF SYSTEMS: Out of a complete 14 system review of symptoms, the patient complains only of the following symptoms, and all other reviewed systems are negative.  Back pain, muscle cramps, insomnia  ALLERGIES: Allergies  Allergen Reactions  . Codeine Nausea And Vomiting  . Codeine Other (See Comments)    unknown  . Tuberculin Tests Hives    "blisters"  . Penicillins Rash    No problems breathing. Has tolerated omnicef in past without issue  . Sulfamethoxazole Rash    HOME MEDICATIONS: Outpatient Prescriptions Prior to Visit  Medication Sig Dispense Refill  . ABILIFY 2 MG tablet TAKE ONE-HALF TABLET BY MOUTH ONCE DAILY 90 tablet 0  . allopurinol (ZYLOPRIM) 100 MG tablet TAKE ONE TABLET BY MOUTH ONCE DAILY ON  THE  DAYS  FOLLOWING  DIALYSIS  (TUESDAY,  THURSDAY  AND  SATURDAYS) 30 tablet 11  . amLODipine (NORVASC) 10 MG tablet Take 10 mg by mouth at bedtime.    Marland Kitchen  ARIPiprazole (ABILIFY) 2 MG tablet Take 1 mg by mouth every morning.    Marland Kitchen aspirin EC 81 MG tablet Take 81 mg by mouth every morning.    Marland Kitchen atorvastatin (LIPITOR) 40 MG tablet Take 1 tablet (40 mg total) by mouth daily. 90 tablet 3  . cinacalcet (SENSIPAR) 30 MG tablet Take 30 mg by mouth at bedtime.    . cloNIDine (CATAPRES) 0.2 MG tablet Take 0.2 mg by mouth 2 (two) times daily.    . cyclobenzaprine (FLEXERIL) 5 MG tablet Take 1 tablet (5 mg total) by mouth 3 (three) times  daily as needed for muscle spasms. 30 tablet 3  . fluticasone (FLONASE) 50 MCG/ACT nasal spray Place 2 sprays into both nostrils daily as needed for allergies or rhinitis.     . folic acid-vitamin b complex-vitamin c-selenium-zinc (DIALYVITE) 3 MG TABS tablet Take 1 tablet by mouth daily.    Marland Kitchen ibuprofen (ADVIL,MOTRIN) 200 MG tablet Take 400 mg by mouth 2 (two) times daily.     Marland Kitchen lidocaine-prilocaine (EMLA) cream Apply 1 application topically 3 (three) times a week. Use before dialysis on MWF    . loratadine (CLARITIN) 10 MG tablet Take 10 mg by mouth daily as needed for allergies.    . methocarbamol (ROBAXIN) 500 MG tablet Take 1 tablet (500 mg total) by mouth every 8 (eight) hours as needed for muscle spasms. 90 tablet 1  . metoprolol succinate (TOPROL-XL) 100 MG 24 hr tablet Take 100 mg by mouth at bedtime. Take with or immediately following a meal.    . omeprazole (PRILOSEC) 20 MG capsule Take 20 mg by mouth 2 (two) times daily.    . pregabalin (LYRICA) 75 MG capsule Take 75 mg by mouth every evening.     . senna (SENOKOT) 8.6 MG tablet Take 2 tablets by mouth daily as needed. For constipation    . sevelamer carbonate (RENVELA) 800 MG tablet Take 2,400 mg by mouth 3 (three) times daily with meals.    . traMADol (ULTRAM) 50 MG tablet Take 1 tablet (50 mg total) by mouth every 8 (eight) hours as needed. 60 tablet 0  . amLODipine (NORVASC) 10 MG tablet TAKE ONE TABLET BY MOUTH ONCE DAILY (Patient not taking: Reported on 07/13/2014) 90 tablet 0  . amLODipine (NORVASC) 10 MG tablet TAKE ONE TABLET BY MOUTH ONCE DAILY (Patient not taking: Reported on 07/13/2014) 90 tablet 0   No facility-administered medications prior to visit.    PAST MEDICAL HISTORY: Past Medical History  Diagnosis Date  . Hyperlipidemia   . Normal cardiac stress test 12/24/2009    lexiscan, imaging normal  . 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 Diverticulitis   . IBS (irritable bowel  syndrome)   . Thyroid disease   . Depression   . Chronic kidney disease     Hemo MWF  . GERD (gastroesophageal reflux disease)   . H/O hiatal hernia   . Arthritis   . RLS (restless legs syndrome)   . Tubular adenoma of colon 01/2008  . Seizures     2004  . Constipation   . Sinus complaint   . Dialysis patient     kidney  . Renal disorder   . Adrenal mass   . Hypertension   . High cholesterol   . Back pain     PAST SURGICAL HISTORY: Past Surgical History  Procedure Laterality Date  . Cardiac catheterization  2003  normal  . Cholecystectomy      Open mid line incision  . Frontal craniotomy  2002    indication = sinusitis  . Appendectomy    . Tubal ligation    . Abdominal hysterectomy    . Insertion of dialysis catheter      Left  . Revison of arteriovenous fistula  05/11/2012    Procedure: REVISON OF ARTERIOVENOUS FISTULA;  Surgeon: Elam Dutch, MD;  Location: [facility];  Service: Vascular;  Laterality: Right;  . Av fistula placement Left 11/11/2012    Procedure: INSERTION OF ARTERIOVENOUS (AV) GORE-TEX GRAFT ARM;  Surgeon: Angelia Mould, MD;  Location: [facility];  Service: Vascular;  Laterality: Left;  . Avgg removal Left 11/18/2012    Procedure: REMOVAL OF LEFT UPPER ARM ARTERIOVENOUS GORETEX GRAFT ([place]);  Surgeon: Angelia Mould, MD;  Location: [facility];  Service: Vascular;  Laterality: Left;  . Patch angioplasty Left 11/18/2012    Procedure: PATCH ANGIOPLASTY;  Surgeon: Angelia Mould, MD;  Location: [facility];  Service: Vascular;  Laterality: Left;  . Frontal cranialcity  2002    FAMILY HISTORY: Family History  Problem Relation Age of Onset  . Thyroid cancer Mother   . Heart disease Father   . Hypertension Father   . Heart attack Father   . Breast cancer Sister   . Lupus Daughter    PHYSICAL EXAM  Filed Vitals:   07/13/14 0857  BP: 136/60  Pulse: 68  Height: 5\' 4"  (1.626 m)  Weight: 180 lb (81.647 kg)   Body mass index is 30.88  kg/(m^2).  Generalized: Well developed, in no acute distress   Neurological examination  Mentation: Alert oriented to time, place, history taking. Follows all commands speech and language fluent Cranial nerve II-XII: Pupils were equal round reactive to light. Extraocular movements were full, visual field were full on confrontational test. Facial sensation and strength were normal. Uvula tongue midline. Head turning and shoulder shrug  were normal and symmetric. Motor: The motor testing reveals 5 over 5 strength of all 4 extremities. Good symmetric motor tone is noted throughout. Very mild right foot drop. Sensory: Sensory testing is intact to soft touch on all 4 extremities. No evidence of extinction is noted.  Coordination: Cerebellar testing reveals good finger-nose-finger and heel-to-shin bilaterally.  Gait and station: Gait is normal. Tandem gait mildly unsteady. Romberg is negative. No drift is seen.  Reflexes: Deep tendon reflexes are symmetric and normal bilaterally.  Marland Kitchen   DIAGNOSTIC DATA (LABS, IMAGING, TESTING) - I reviewed patient records, labs, notes, testing and imaging myself where available.      ASSESSMENT AND PLAN 74 y.o. year old female  has a past medical history of Hyperlipidemia; Normal cardiac stress test (12/24/2009); ANEMIA NEC (03/31/2007); Diverticulitis; IBS (irritable bowel syndrome); Thyroid disease; Depression; Chronic kidney disease; GERD (gastroesophageal reflux disease); H/O hiatal hernia; Arthritis; RLS (restless legs syndrome); Tubular adenoma of colon (01/2008); Seizures; Constipation; Sinus complaint; Dialysis patient; Renal disorder; Adrenal mass; Hypertension; High cholesterol; and Back pain. here with:  1. Incontinence of bowels 2. Right foot drop 3. Tobacco disorder.   Overall the patient is doing well.  Incontinence has improved.  Right foot drop as improved- wear AFO brace as needed. Patient would like help to quit smoking- I spent 5 minutes  counseling the patient about the risk of smoking. I explained that [facility] offered smoking cessation classes and what that consisted of. Patient is interested. She was provided with a phone number so she  could call and make an appointment.  F/U in 1 year or sooner if needed.   Ward Givens, MSN, NP-C 07/13/2014, 9:06 AM [facility] [address], [place], [place] [address] [phone]  Note: This document was prepared with digital dictation and possible smart phrase technology. Any transcriptional errors that result from this process are unintentional.

## 2014-07-14 DIAGNOSIS — N186 End stage renal disease: Secondary | ICD-10-CM | POA: Diagnosis not present

## 2014-07-14 DIAGNOSIS — N2581 Secondary hyperparathyroidism of renal origin: Secondary | ICD-10-CM | POA: Diagnosis not present

## 2014-07-14 DIAGNOSIS — D631 Anemia in chronic kidney disease: Secondary | ICD-10-CM | POA: Diagnosis not present

## 2014-07-17 DIAGNOSIS — D631 Anemia in chronic kidney disease: Secondary | ICD-10-CM | POA: Diagnosis not present

## 2014-07-17 DIAGNOSIS — N186 End stage renal disease: Secondary | ICD-10-CM | POA: Diagnosis not present

## 2014-07-17 DIAGNOSIS — N2581 Secondary hyperparathyroidism of renal origin: Secondary | ICD-10-CM | POA: Diagnosis not present

## 2014-07-19 DIAGNOSIS — N2581 Secondary hyperparathyroidism of renal origin: Secondary | ICD-10-CM | POA: Diagnosis not present

## 2014-07-19 DIAGNOSIS — D631 Anemia in chronic kidney disease: Secondary | ICD-10-CM | POA: Diagnosis not present

## 2014-07-19 DIAGNOSIS — N186 End stage renal disease: Secondary | ICD-10-CM | POA: Diagnosis not present

## 2014-07-21 DIAGNOSIS — N2581 Secondary hyperparathyroidism of renal origin: Secondary | ICD-10-CM | POA: Diagnosis not present

## 2014-07-21 DIAGNOSIS — D631 Anemia in chronic kidney disease: Secondary | ICD-10-CM | POA: Diagnosis not present

## 2014-07-21 DIAGNOSIS — N186 End stage renal disease: Secondary | ICD-10-CM | POA: Diagnosis not present

## 2014-07-24 DIAGNOSIS — D631 Anemia in chronic kidney disease: Secondary | ICD-10-CM | POA: Diagnosis not present

## 2014-07-24 DIAGNOSIS — N2581 Secondary hyperparathyroidism of renal origin: Secondary | ICD-10-CM | POA: Diagnosis not present

## 2014-07-24 DIAGNOSIS — N186 End stage renal disease: Secondary | ICD-10-CM | POA: Diagnosis not present

## 2014-07-25 ENCOUNTER — Encounter: Payer: Self-pay | Admitting: Gastroenterology

## 2014-07-26 DIAGNOSIS — N2581 Secondary hyperparathyroidism of renal origin: Secondary | ICD-10-CM | POA: Diagnosis not present

## 2014-07-26 DIAGNOSIS — D631 Anemia in chronic kidney disease: Secondary | ICD-10-CM | POA: Diagnosis not present

## 2014-07-26 DIAGNOSIS — N186 End stage renal disease: Secondary | ICD-10-CM | POA: Diagnosis not present

## 2014-07-28 DIAGNOSIS — N2581 Secondary hyperparathyroidism of renal origin: Secondary | ICD-10-CM | POA: Diagnosis not present

## 2014-07-28 DIAGNOSIS — N186 End stage renal disease: Secondary | ICD-10-CM | POA: Diagnosis not present

## 2014-07-28 DIAGNOSIS — D631 Anemia in chronic kidney disease: Secondary | ICD-10-CM | POA: Diagnosis not present

## 2014-07-31 DIAGNOSIS — N2581 Secondary hyperparathyroidism of renal origin: Secondary | ICD-10-CM | POA: Diagnosis not present

## 2014-07-31 DIAGNOSIS — Z992 Dependence on renal dialysis: Secondary | ICD-10-CM | POA: Diagnosis not present

## 2014-07-31 DIAGNOSIS — D631 Anemia in chronic kidney disease: Secondary | ICD-10-CM | POA: Diagnosis not present

## 2014-07-31 DIAGNOSIS — N186 End stage renal disease: Secondary | ICD-10-CM | POA: Diagnosis not present

## 2014-08-02 DIAGNOSIS — D509 Iron deficiency anemia, unspecified: Secondary | ICD-10-CM | POA: Diagnosis not present

## 2014-08-02 DIAGNOSIS — D631 Anemia in chronic kidney disease: Secondary | ICD-10-CM | POA: Diagnosis not present

## 2014-08-02 DIAGNOSIS — N186 End stage renal disease: Secondary | ICD-10-CM | POA: Diagnosis not present

## 2014-08-02 DIAGNOSIS — N2581 Secondary hyperparathyroidism of renal origin: Secondary | ICD-10-CM | POA: Diagnosis not present

## 2014-08-04 DIAGNOSIS — D509 Iron deficiency anemia, unspecified: Secondary | ICD-10-CM | POA: Diagnosis not present

## 2014-08-04 DIAGNOSIS — D631 Anemia in chronic kidney disease: Secondary | ICD-10-CM | POA: Diagnosis not present

## 2014-08-04 DIAGNOSIS — N2581 Secondary hyperparathyroidism of renal origin: Secondary | ICD-10-CM | POA: Diagnosis not present

## 2014-08-04 DIAGNOSIS — N186 End stage renal disease: Secondary | ICD-10-CM | POA: Diagnosis not present

## 2014-08-07 DIAGNOSIS — D509 Iron deficiency anemia, unspecified: Secondary | ICD-10-CM | POA: Diagnosis not present

## 2014-08-07 DIAGNOSIS — D631 Anemia in chronic kidney disease: Secondary | ICD-10-CM | POA: Diagnosis not present

## 2014-08-07 DIAGNOSIS — N186 End stage renal disease: Secondary | ICD-10-CM | POA: Diagnosis not present

## 2014-08-07 DIAGNOSIS — N2581 Secondary hyperparathyroidism of renal origin: Secondary | ICD-10-CM | POA: Diagnosis not present

## 2014-08-09 DIAGNOSIS — D509 Iron deficiency anemia, unspecified: Secondary | ICD-10-CM | POA: Diagnosis not present

## 2014-08-09 DIAGNOSIS — N186 End stage renal disease: Secondary | ICD-10-CM | POA: Diagnosis not present

## 2014-08-09 DIAGNOSIS — D631 Anemia in chronic kidney disease: Secondary | ICD-10-CM | POA: Diagnosis not present

## 2014-08-09 DIAGNOSIS — N2581 Secondary hyperparathyroidism of renal origin: Secondary | ICD-10-CM | POA: Diagnosis not present

## 2014-08-11 DIAGNOSIS — D631 Anemia in chronic kidney disease: Secondary | ICD-10-CM | POA: Diagnosis not present

## 2014-08-11 DIAGNOSIS — N2581 Secondary hyperparathyroidism of renal origin: Secondary | ICD-10-CM | POA: Diagnosis not present

## 2014-08-11 DIAGNOSIS — N186 End stage renal disease: Secondary | ICD-10-CM | POA: Diagnosis not present

## 2014-08-11 DIAGNOSIS — D509 Iron deficiency anemia, unspecified: Secondary | ICD-10-CM | POA: Diagnosis not present

## 2014-08-14 DIAGNOSIS — N186 End stage renal disease: Secondary | ICD-10-CM | POA: Diagnosis not present

## 2014-08-14 DIAGNOSIS — D509 Iron deficiency anemia, unspecified: Secondary | ICD-10-CM | POA: Diagnosis not present

## 2014-08-14 DIAGNOSIS — N2581 Secondary hyperparathyroidism of renal origin: Secondary | ICD-10-CM | POA: Diagnosis not present

## 2014-08-14 DIAGNOSIS — D631 Anemia in chronic kidney disease: Secondary | ICD-10-CM | POA: Diagnosis not present

## 2014-08-16 DIAGNOSIS — D631 Anemia in chronic kidney disease: Secondary | ICD-10-CM | POA: Diagnosis not present

## 2014-08-16 DIAGNOSIS — D509 Iron deficiency anemia, unspecified: Secondary | ICD-10-CM | POA: Diagnosis not present

## 2014-08-16 DIAGNOSIS — N186 End stage renal disease: Secondary | ICD-10-CM | POA: Diagnosis not present

## 2014-08-16 DIAGNOSIS — N2581 Secondary hyperparathyroidism of renal origin: Secondary | ICD-10-CM | POA: Diagnosis not present

## 2014-08-18 DIAGNOSIS — N186 End stage renal disease: Secondary | ICD-10-CM | POA: Diagnosis not present

## 2014-08-18 DIAGNOSIS — D631 Anemia in chronic kidney disease: Secondary | ICD-10-CM | POA: Diagnosis not present

## 2014-08-18 DIAGNOSIS — D509 Iron deficiency anemia, unspecified: Secondary | ICD-10-CM | POA: Diagnosis not present

## 2014-08-18 DIAGNOSIS — N2581 Secondary hyperparathyroidism of renal origin: Secondary | ICD-10-CM | POA: Diagnosis not present

## 2014-08-21 DIAGNOSIS — D509 Iron deficiency anemia, unspecified: Secondary | ICD-10-CM | POA: Diagnosis not present

## 2014-08-21 DIAGNOSIS — D631 Anemia in chronic kidney disease: Secondary | ICD-10-CM | POA: Diagnosis not present

## 2014-08-21 DIAGNOSIS — N2581 Secondary hyperparathyroidism of renal origin: Secondary | ICD-10-CM | POA: Diagnosis not present

## 2014-08-21 DIAGNOSIS — N186 End stage renal disease: Secondary | ICD-10-CM | POA: Diagnosis not present

## 2014-08-23 DIAGNOSIS — N186 End stage renal disease: Secondary | ICD-10-CM | POA: Diagnosis not present

## 2014-08-23 DIAGNOSIS — D509 Iron deficiency anemia, unspecified: Secondary | ICD-10-CM | POA: Diagnosis not present

## 2014-08-23 DIAGNOSIS — N2581 Secondary hyperparathyroidism of renal origin: Secondary | ICD-10-CM | POA: Diagnosis not present

## 2014-08-23 DIAGNOSIS — D631 Anemia in chronic kidney disease: Secondary | ICD-10-CM | POA: Diagnosis not present

## 2014-08-25 DIAGNOSIS — N186 End stage renal disease: Secondary | ICD-10-CM | POA: Diagnosis not present

## 2014-08-25 DIAGNOSIS — D631 Anemia in chronic kidney disease: Secondary | ICD-10-CM | POA: Diagnosis not present

## 2014-08-25 DIAGNOSIS — D509 Iron deficiency anemia, unspecified: Secondary | ICD-10-CM | POA: Diagnosis not present

## 2014-08-25 DIAGNOSIS — N2581 Secondary hyperparathyroidism of renal origin: Secondary | ICD-10-CM | POA: Diagnosis not present

## 2014-08-28 DIAGNOSIS — N186 End stage renal disease: Secondary | ICD-10-CM | POA: Diagnosis not present

## 2014-08-28 DIAGNOSIS — N2581 Secondary hyperparathyroidism of renal origin: Secondary | ICD-10-CM | POA: Diagnosis not present

## 2014-08-28 DIAGNOSIS — D509 Iron deficiency anemia, unspecified: Secondary | ICD-10-CM | POA: Diagnosis not present

## 2014-08-28 DIAGNOSIS — D631 Anemia in chronic kidney disease: Secondary | ICD-10-CM | POA: Diagnosis not present

## 2014-08-30 DIAGNOSIS — N2581 Secondary hyperparathyroidism of renal origin: Secondary | ICD-10-CM | POA: Diagnosis not present

## 2014-08-30 DIAGNOSIS — N186 End stage renal disease: Secondary | ICD-10-CM | POA: Diagnosis not present

## 2014-08-30 DIAGNOSIS — D509 Iron deficiency anemia, unspecified: Secondary | ICD-10-CM | POA: Diagnosis not present

## 2014-08-30 DIAGNOSIS — D631 Anemia in chronic kidney disease: Secondary | ICD-10-CM | POA: Diagnosis not present

## 2014-08-31 DIAGNOSIS — Z992 Dependence on renal dialysis: Secondary | ICD-10-CM | POA: Diagnosis not present

## 2014-08-31 DIAGNOSIS — N186 End stage renal disease: Secondary | ICD-10-CM | POA: Diagnosis not present

## 2014-08-31 DIAGNOSIS — E1129 Type 2 diabetes mellitus with other diabetic kidney complication: Secondary | ICD-10-CM | POA: Diagnosis not present

## 2014-09-01 DIAGNOSIS — N2581 Secondary hyperparathyroidism of renal origin: Secondary | ICD-10-CM | POA: Diagnosis not present

## 2014-09-01 DIAGNOSIS — D631 Anemia in chronic kidney disease: Secondary | ICD-10-CM | POA: Diagnosis not present

## 2014-09-01 DIAGNOSIS — N186 End stage renal disease: Secondary | ICD-10-CM | POA: Diagnosis not present

## 2014-09-04 DIAGNOSIS — N2581 Secondary hyperparathyroidism of renal origin: Secondary | ICD-10-CM | POA: Diagnosis not present

## 2014-09-04 DIAGNOSIS — D631 Anemia in chronic kidney disease: Secondary | ICD-10-CM | POA: Diagnosis not present

## 2014-09-04 DIAGNOSIS — N186 End stage renal disease: Secondary | ICD-10-CM | POA: Diagnosis not present

## 2014-09-06 DIAGNOSIS — N186 End stage renal disease: Secondary | ICD-10-CM | POA: Diagnosis not present

## 2014-09-06 DIAGNOSIS — D631 Anemia in chronic kidney disease: Secondary | ICD-10-CM | POA: Diagnosis not present

## 2014-09-06 DIAGNOSIS — N2581 Secondary hyperparathyroidism of renal origin: Secondary | ICD-10-CM | POA: Diagnosis not present

## 2014-09-07 DIAGNOSIS — Z992 Dependence on renal dialysis: Secondary | ICD-10-CM | POA: Diagnosis not present

## 2014-09-07 DIAGNOSIS — I871 Compression of vein: Secondary | ICD-10-CM | POA: Diagnosis not present

## 2014-09-07 DIAGNOSIS — N186 End stage renal disease: Secondary | ICD-10-CM | POA: Diagnosis not present

## 2014-09-07 DIAGNOSIS — T82858D Stenosis of vascular prosthetic devices, implants and grafts, subsequent encounter: Secondary | ICD-10-CM | POA: Diagnosis not present

## 2014-09-08 DIAGNOSIS — D631 Anemia in chronic kidney disease: Secondary | ICD-10-CM | POA: Diagnosis not present

## 2014-09-08 DIAGNOSIS — N2581 Secondary hyperparathyroidism of renal origin: Secondary | ICD-10-CM | POA: Diagnosis not present

## 2014-09-08 DIAGNOSIS — N186 End stage renal disease: Secondary | ICD-10-CM | POA: Diagnosis not present

## 2014-09-11 DIAGNOSIS — N2581 Secondary hyperparathyroidism of renal origin: Secondary | ICD-10-CM | POA: Diagnosis not present

## 2014-09-11 DIAGNOSIS — D631 Anemia in chronic kidney disease: Secondary | ICD-10-CM | POA: Diagnosis not present

## 2014-09-11 DIAGNOSIS — N186 End stage renal disease: Secondary | ICD-10-CM | POA: Diagnosis not present

## 2014-09-13 DIAGNOSIS — N2581 Secondary hyperparathyroidism of renal origin: Secondary | ICD-10-CM | POA: Diagnosis not present

## 2014-09-13 DIAGNOSIS — D631 Anemia in chronic kidney disease: Secondary | ICD-10-CM | POA: Diagnosis not present

## 2014-09-13 DIAGNOSIS — N186 End stage renal disease: Secondary | ICD-10-CM | POA: Diagnosis not present

## 2014-09-15 DIAGNOSIS — D631 Anemia in chronic kidney disease: Secondary | ICD-10-CM | POA: Diagnosis not present

## 2014-09-15 DIAGNOSIS — N2581 Secondary hyperparathyroidism of renal origin: Secondary | ICD-10-CM | POA: Diagnosis not present

## 2014-09-15 DIAGNOSIS — N186 End stage renal disease: Secondary | ICD-10-CM | POA: Diagnosis not present

## 2014-09-18 DIAGNOSIS — N186 End stage renal disease: Secondary | ICD-10-CM | POA: Diagnosis not present

## 2014-09-18 DIAGNOSIS — N2581 Secondary hyperparathyroidism of renal origin: Secondary | ICD-10-CM | POA: Diagnosis not present

## 2014-09-18 DIAGNOSIS — D631 Anemia in chronic kidney disease: Secondary | ICD-10-CM | POA: Diagnosis not present

## 2014-09-20 DIAGNOSIS — D631 Anemia in chronic kidney disease: Secondary | ICD-10-CM | POA: Diagnosis not present

## 2014-09-20 DIAGNOSIS — N2581 Secondary hyperparathyroidism of renal origin: Secondary | ICD-10-CM | POA: Diagnosis not present

## 2014-09-20 DIAGNOSIS — N186 End stage renal disease: Secondary | ICD-10-CM | POA: Diagnosis not present

## 2014-09-22 DIAGNOSIS — D631 Anemia in chronic kidney disease: Secondary | ICD-10-CM | POA: Diagnosis not present

## 2014-09-22 DIAGNOSIS — N2581 Secondary hyperparathyroidism of renal origin: Secondary | ICD-10-CM | POA: Diagnosis not present

## 2014-09-22 DIAGNOSIS — N186 End stage renal disease: Secondary | ICD-10-CM | POA: Diagnosis not present

## 2014-09-22 NOTE — Op Note (Signed)
PATIENT NAME:  [name], [name] MR#:  [id number] DATE OF BIRTH:  May 30, 1941  DATE OF PROCEDURE:  03/29/2013  PREOPERATIVE DIAGNOSIS: End-stage renal disease with functional permanent dialysis catheter and no longer needing PermCath.   POSTOPERATIVE DIAGNOSIS: End-stage renal disease with functional permanent dialysis access and no longer needing PermCath.   PROCEDURE PERFORMED: Removal of left femoral PermCath.   SURGEON: [place], PA-C.   ANESTHESIA: Local.   ESTIMATED BLOOD LOSS: Minimal.   INDICATION FOR THE PROCEDURE: The patient is a 74 year old African American female with end-stage renal disease. Her access is functional, and she no longer needs PermCath. This will be removed.   DESCRIPTION OF THE PROCEDURE: The patient is brought to the vascular interventional radiology area and positioned supine. The left groin and existing catheter were sterilely prepped and draped, and a sterile surgical field was created. The area was locally anesthetized copiously with 1% lidocaine. Hemostats were used to help dissect out the cuff. An 11 blade was used to transect the fibrous sheath connected to the cuff. The catheter was then removed in its entirety without difficulty with gentle traction. Pressure was held at the groin. Sterile dressing was placed. The patient tolerated the procedure well. No complications.   ____________________________ Marin Shutter [place], PA-C cnh:gb D: 03/29/2013 15:55:47 ET T: 03/29/2013 18:27:20 ET JOB#: [id number]  cc: Marin Shutter. Aikam Vinje, PA-C, <Dictator> [place] PA ELECTRONICALLY SIGNED 03/31/2013 10:45

## 2014-09-22 NOTE — Op Note (Signed)
PATIENT NAME:  [name], [name] MR#:  [id number] DATE OF BIRTH:  1940-08-29  DATE OF PROCEDURE:  01/27/2013  PREOPERATIVE DIAGNOSES: 1.  End-stage renal disease.  2.  Superior vena cava syndrome.  3.  Difficult dialysis access with multiple previous failed accesses.  4.  Hypertension   POSTOPERATIVE DIAGNOSES:  1.  End-stage renal disease.  2.  Superior vena cava syndrome.  3.  Difficult dialysis access with multiple previous failed accesses.  4.  Hypertension   PROCEDURES: 1.  Placement of left femoral PermCath with ultrasound and fluoroscopic guidance.  2.  Removal of left jugular PermCath.  3.  Catheter placement in superior vena cava from left jugular approach.  4.  Superior venacavogram.  5.  Percutaneous transluminal angioplasty of left innominate jugular and superior vena cava with 10 mm diameter angioplasty balloon.  6.  Placement of a HeRO graft to left arm from brachial artery to jugular vein.   SURGEON: Algernon Huxley, M.D.   ANESTHESIA: General.   ESTIMATED BLOOD LOSS: Approximately 250 mL.   INDICATION FOR PROCEDURE: This is a lady who came to [place] for difficult dialysis access situation. She has had multiple previous failed accesses. 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 We plan to use her existing catheter for her access to the central venous circulation and to place a HeRO graft through this. In order to do that, we will have to remove the catheter and place a PermCath in her thigh for several weeks. The risks and benefits were discussed. Informed consent was obtained.   DESCRIPTION OF PROCEDURE: The patient was brought to the operative suite, and after an adequate level of general anesthesia was obtained, initially the left groin was sterilely prepped and draped and a sterile surgical field was created. The femoral vein was visualized with ultrasound and found to be patent. It was accessed under direct ultrasound guidance without  difficulty with a Seldinger needle, and a J-wire was placed. After skin nick and dilatation, fluoroscopic guide was used to park the catheter in the suprarenal inferior vena cava. This is a 40 cm PermCath. It was brought out through a counterincision in the left anterior lateral thigh, secured to the skin with 2 Prolene sutures. Monocryl sutures were placed around the exit site and at the access site. Sterile dressing was placed.   We then turned our attention to the HeRO graft. The existing catheter, the left neck, chest and arm were all sterilely prepped and draped and a sterile surgical field was created. A cutdown was performed over the existing catheter near the jugular access site. The catheter was transected. It would later be removed at the end of the procedure with the distal portion of the catheter. The intravascular portion was removed after rewiring with a Magic torque wire and parking this in the inferior vena cava. An 8-French sheath was then placed. Imaging was performed which showed stenosis around the old catheter in the jugular innominate venous system down to the superior vena cava which was patent. Balloon angioplasty was performed with a 10 mm diameter angioplasty balloon with a tight waste taken, which resolved with inflation. I was then able to advance the peel-away sheath over the wire and park the silicone portion of the HeRO graft into the right atrium. This was then brought out through a counterincision at the left shoulder. An antecubital incision was performed to dissect out the brachial artery, which was encircled with vessel loops proximally and distally, and  tunneled from the antecubital incision to the shoulder. The patient was given 3000 units of intravenous heparin for systemic anticoagulation. The silicone portion was connected to the grommet in the PTFE graft portion of the shoulder with the catheter parked in the distal right atrium. The graft was then cut and beveled to  appropriate length to match the anterior wall arteriotomy, on the brachial artery, and anastomosis was created with a running CV-6 suture in the usual fashion. The vessel was flushed and de-aired prior to releasing control. On release there was a nice pulsatile flow through the HeRO graft. The wound was then irrigated. Surgicel and Evicel topical hemostatic agents were placed and hemostasis was complete. The wounds were all closed with 3-0 Vicryl and a 4-0 Monocryl and Dermabond was placed as a dressing. The patient tolerated the procedure well and was taken to the recovery room in stable condition.  ____________________________ Algernon Huxley, MD jsd:sb D: 01/27/2013 15:23:12 ET T: 01/27/2013 15:47:57 ET JOB#: [id number]  cc: Algernon Huxley, MD, <Dictator> Donato Heinz, MD Algernon Huxley MD ELECTRONICALLY SIGNED 02/02/2013 16:12

## 2014-09-22 NOTE — Op Note (Signed)
PATIENT NAME:  [name], [name] MR#:  [id number] DATE OF BIRTH:  May 07, 1941  DATE OF PROCEDURE:  03/03/2013  PREOPERATIVE DIAGNOSES: 1.  End-stage renal disease.  2.  Superior vena cava syndrome.  3.  Status post left arm HeRO graft placement.  4.  Inability to access graft.   POSTOPERATIVE DIAGNOSES: 1.  End-stage renal disease.  2.  Superior vena cava syndrome.  3.  Status post left arm HeRO graft placement.  4.  Inability to access graft.   PROCEDURES: 1.  Ultrasound guidance for vascular access to left arm HeRO graft.  2.  Left upper extremity shuntogram and central venogram.   SURGEON: Algernon Huxley, M.D.   ANESTHESIA: Local with moderate conscious sedation.   ESTIMATED BLOOD LOSS: Minimal.   FLUOROSCOPY TIME: Less than 1 minute.   CONTRAST USED: 20 mL.   INDICATION FOR PROCEDURE: This is a 74 year old African American female with end-stage renal disease. She had a HeRO graft placed about 6 weeks ago. They tried to use this earlier this week for the first time and were unable to access this and were pulling clots, fearing that this was thrombosed. We are asked to assess this.   DESCRIPTION OF PROCEDURE: The patient is brought to the vascular and interventional radiology suite. Left upper extremity was sterilely prepped and draped and a sterile surgical field was created. The graft was visualized with ultrasound and found to be widely patent. It was then accessed under direct ultrasound guidance without difficulty with a micropuncture needle. A micropuncture wire and sheath were then placed. This was done in retrograde fashion. The tip of the micropuncture sheath went to the arterial anastomosis. Imaging of the HeRO graft  was then performed. This showed a widely patent HeRO graft without significant stenoses, with brisk flow throughout the graft and into the right atrium, with the catheter tip being in excellent location. At this point, I elected to terminate the procedure. The sheath  was removed around a 4-0 Monocryl pursestring suture. Pressure was held. Sterile dressing was placed. The patient tolerated the procedure well and was taken to the recovery room in stable condition.   ____________________________ Algernon Huxley, MD jsd:jm D: 03/03/2013 14:07:27 ET T: 03/03/2013 14:21:45 ET JOB#: [id number]  cc: Algernon Huxley, MD, <Dictator> Donato Heinz, MD Algernon Huxley MD ELECTRONICALLY SIGNED 03/23/2013 10:20

## 2014-09-23 NOTE — Op Note (Signed)
PATIENT NAME:  [name], [name] MR#:  [id number] DATE OF BIRTH:  1941-01-07  DATE OF PROCEDURE:  08/16/2013  PREOPERATIVE DIAGNOSIS: End-stage renal disease with functional permanent dialysis access and no longer needing PermCath.   POSTOPERATIVE DIAGNOSIS: End-stage renal disease with functional permanent dialysis access and no longer needing PermCath.   PROCEDURE: Removal of right jugular PermCath.   SURGEON: Automotive engineer. PA-C.   ANESTHESIA: Local.   ESTIMATED BLOOD LOSS: Minimal.   INDICATION FOR THE PROCEDURE: The patient is a 74 year old African American female with end-stage renal disease. Her access is functional and she no longer needs a PermCath, therefore this will be removed.   DESCRIPTION OF THE PROCEDURE: The patient is brought to the vascular interventional radiology area and positioned supine. The right neck and chest and existing catheter were sterilely prepped and draped in a sterile surgical field was created. The area was locally anesthetized copiously with 1% lidocaine. Hemostats were used to help dissect out the cuff. An 11 blade was used to transect the fibrous sheath connected to the cuff. The catheter was then removed in its entirety without difficulty with gentle traction. Pressure was held at the base of the neck. Sterile dressing was placed. The patient tolerated the procedure well.    ____________________________ Marin Shutter. Lysha Schrade, PA-C cnh:sg D: 08/16/2013 13:08:09 ET T: 08/16/2013 14:24:51 ET JOB#: [id number]  cc: Marin Shutter. Howard Patton, PA-C, <Dictator> Myrtis Maille N Haile Bosler PA ELECTRONICALLY SIGNED 08/19/2013 17:00

## 2014-09-23 NOTE — Op Note (Signed)
PATIENT NAME:  [name], [name] MR#:  [id number] DATE OF BIRTH:  1941/02/02  DATE OF PROCEDURE:  04/24/2014  PREOPERATIVE DIAGNOSES:  1. End-stage renal disease.  2. Poorly functioning left arm HeRO graft.   POSTOPERATIVE DIAGNOSES:  1. End-stage renal disease.  2. Poorly functioning left arm HeRO graft.   PROCEDURES: 1. Ultrasound guidance for vascular access to left arm HeRO graft.  2. Left upper extremity shuntogram and central venogram.  4. Percutaneous transluminal angioplasty with 6-mm diameter Lutonix drug-coated angioplasty balloon and 7-mm diameter high-pressure angioplasty balloon to the venous access site of the HeRO graft.   SURGEON: Algernon Huxley, MD.   ANESTHESIA: Local with moderate conscious sedation.   ESTIMATED BLOOD LOSS: Minimal.   INDICATION FOR PROCEDURE: A 74 year old female with end-stage renal disease. Her graft has began having difficulties with runs and low flows and poor access. Duplex showed areas of stenosis at the venous access site. She is brought in for intervention.   DESCRIPTION OF PROCEDURE: The patient is brought to the vascular suite. The left upper extremity is sterilely prepped and draped and a sterile surgical field was created. The graft was accessed closer to the arterial anastomosis under direct ultrasound guidance with a micropuncture needle, and a permanent image was recorded. A micropuncture wire and sheath were placed and upsized to a 6-French sheath and gave the patient 3000 units of intravenous heparin. Imaging performed showed very high-grade stenosis in the 85% range at the venous access site with some irregularity before and after the most narrowed area. The outflow portion of the HeRO graft was widely patent. I crossed the lesion without difficulty with a Magic torque wire and treated this area with a 6-mm diameter Lutonix drug-coated angioplasty balloon followed by a 7-mm diameter high-pressure angioplasty balloon. With the balloon inflated,  imaging was performed which opacified the arterial anastomosis which was widely patent. Completion angiogram following angioplasty showed markedly improved flow with only about a 5 to 10% residual stenosis that was clearly not flow limiting. At this point, I elected to terminate the procedure. The sheath was removed and 4-0 Monocryl pursestring suture was placed. Pressure was held. Sterile dressings were placed. The patient tolerated the procedure well and was taken to the recovery room in stable condition.    ____________________________ Algernon Huxley, MD jsd:jh D: 04/24/2014 09:08:58 ET T: 04/24/2014 10:52:56 ET JOB#: [id number]  cc: Algernon Huxley, MD, <Dictator> Algernon Huxley MD ELECTRONICALLY SIGNED 05/14/2014 11:41

## 2014-09-23 NOTE — Op Note (Signed)
PATIENT NAME:  [name], TIPPING MR#:  [id number] DATE OF BIRTH:  [birth year]  DATE OF PROCEDURE:  07/07/2013  PREOPERATIVE DIAGNOSES:  1.  End-stage renal disease.  2.  Nonusable left arm HERO graft.  3.  Hypertension.   POSTOPERATIVE DIAGNOSES: 1.  End-stage renal disease.  2.  Nonusable left arm HERO graft.  3.  Hypertension.   PROCEDURE:  Superficialization/revision of left arm HERO graft.   SURGEON:  Algernon Huxley, M.D.   ANESTHESIA:  General.   ESTIMATED BLOOD LOSS:  Approximately 25 mL.   INDICATION FOR PROCEDURE:  This is a 74 year old African American female with end-stage renal disease. Her left arm HERO graft has not been usable for dialysis. She has had 3 separate infiltrations. It is slightly deep and the techs are unable to access this. We are here to try to revise this to make it a usable access.   DESCRIPTION OF PROCEDURE:  The patient is brought to the operative suite, and after an adequate level of general was obtained, the left upper extremity was sterilely prepped and a sterile surgical field was created. The SonoSite was used to guide our incision. We made an incision overlying the HERO graft from just above the antecubital fossa to near the connection to the central portion. I then dissected out the graft circumferentially throughout this course and raised it to an immediate subcutaneous location. Three interrupted Vicryl sutures were used to close deep tissue underneath this to keep the graft elevated and then a 3-0 Vicryl and a 4-0 Monocryl were used to close the subcutaneous tissue and skin immediately over the graft.    Dermabond was placed as a dressing. The patient tolerated the procedure well and was taken to the recovery room in stable condition.   ____________________________ Algernon Huxley, MD jsd:jm D: 07/07/2013 17:15:29 ET T: 07/07/2013 17:37:50 ET JOB#: [id number]  cc: Algernon Huxley, MD, <Dictator> Algernon Huxley MD ELECTRONICALLY SIGNED 07/11/2013 13:50

## 2014-09-24 ENCOUNTER — Other Ambulatory Visit: Payer: Self-pay | Admitting: Family Medicine

## 2014-09-25 DIAGNOSIS — D631 Anemia in chronic kidney disease: Secondary | ICD-10-CM | POA: Diagnosis not present

## 2014-09-25 DIAGNOSIS — N186 End stage renal disease: Secondary | ICD-10-CM | POA: Diagnosis not present

## 2014-09-25 DIAGNOSIS — N2581 Secondary hyperparathyroidism of renal origin: Secondary | ICD-10-CM | POA: Diagnosis not present

## 2014-09-27 ENCOUNTER — Encounter: Payer: Self-pay | Admitting: Family Medicine

## 2014-09-27 ENCOUNTER — Ambulatory Visit (INDEPENDENT_AMBULATORY_CARE_PROVIDER_SITE_OTHER): Payer: Medicare Other | Admitting: Family Medicine

## 2014-09-27 VITALS — BP 184/59 | HR 70 | Temp 97.7°F | Ht 62.0 in | Wt 186.2 lb

## 2014-09-27 DIAGNOSIS — E7800 Pure hypercholesterolemia, unspecified: Secondary | ICD-10-CM

## 2014-09-27 DIAGNOSIS — K121 Other forms of stomatitis: Secondary | ICD-10-CM | POA: Diagnosis not present

## 2014-09-27 DIAGNOSIS — M25511 Pain in right shoulder: Secondary | ICD-10-CM | POA: Diagnosis present

## 2014-09-27 DIAGNOSIS — I10 Essential (primary) hypertension: Secondary | ICD-10-CM

## 2014-09-27 DIAGNOSIS — M654 Radial styloid tenosynovitis [de Quervain]: Secondary | ICD-10-CM

## 2014-09-27 DIAGNOSIS — D631 Anemia in chronic kidney disease: Secondary | ICD-10-CM | POA: Diagnosis not present

## 2014-09-27 DIAGNOSIS — N186 End stage renal disease: Secondary | ICD-10-CM | POA: Diagnosis not present

## 2014-09-27 DIAGNOSIS — N2581 Secondary hyperparathyroidism of renal origin: Secondary | ICD-10-CM | POA: Diagnosis not present

## 2014-09-27 MED ORDER — SULFURIC ACID-SULF PHENOLICS 30-50 % MT SOLN: OROMUCOSAL | Status: DC

## 2014-09-27 MED ORDER — LOSARTAN POTASSIUM 100 MG PO TABS: 100.0000 mg | ORAL_TABLET | Freq: Every day | ORAL | Status: DC

## 2014-09-27 MED ORDER — HYDROCODONE-ACETAMINOPHEN 5-325 MG PO TABS: 1.0000 | ORAL_TABLET | Freq: Four times a day (QID) | ORAL | Status: DC | PRN

## 2014-09-27 MED ORDER — ABILIFY 2 MG PO TABS: 1.0000 mg | ORAL_TABLET | Freq: Every day | ORAL | Status: DC

## 2014-09-27 MED ORDER — LORATADINE 10 MG PO TABS: 10.0000 mg | ORAL_TABLET | Freq: Every day | ORAL | Status: DC | PRN

## 2014-09-27 NOTE — Patient Instructions (Addendum)
It was nice to see you today.  I am placing a referral for Physical therapy.  Use the pain medication as needed.  Use CeraVe (or the off brand) daily for your skin. It is available at most drug stores/[facility]/[employer].  Use warm salt water gargles regarding your mouth lesion.  Use the Debacterol as indicated once.   Please schedule an appointment with your Dentist.  Follow up in ~ 1 month.  Have them schedule a 30 min slot for removal of the toe nail.     Pick up your brace at [address], [place],  [address]

## 2014-09-28 ENCOUNTER — Encounter: Payer: Self-pay | Admitting: *Deleted

## 2014-09-28 DIAGNOSIS — M25511 Pain in right shoulder: Secondary | ICD-10-CM | POA: Insufficient documentation

## 2014-09-28 DIAGNOSIS — M654 Radial styloid tenosynovitis [de Quervain]: Secondary | ICD-10-CM | POA: Insufficient documentation

## 2014-09-28 DIAGNOSIS — K121 Other forms of stomatitis: Secondary | ICD-10-CM | POA: Insufficient documentation

## 2014-09-28 NOTE — Progress Notes (Unsigned)
Prior Authorization received from [employer] for [employer].  PA form placed in provider box for completion. Derl Barrow, RN

## 2014-09-28 NOTE — Assessment & Plan Note (Signed)
Exam and ultrasound consistent with de Quervain's tenosynovitis (bedside ultrasound was performed by Dr. Awanda Mink). Offered injection therapy and patient declined. Treating with thumb spica brace/splint

## 2014-09-28 NOTE — Assessment & Plan Note (Signed)
I advised warm salt water gargles, and follow-up with her dentist. I did give her prescription for Bactrim which she can use for the lesion which will help healing and pain.

## 2014-09-28 NOTE — Assessment & Plan Note (Signed)
Likely secondary to rotator cuff pathology. I discussed treatment options with the patient including physical therapy, injection, pain medication/anti-inflammatories. I informed her that I do not think anti-inflammatories are good option given her renal disease. I offered joint injection and she declined. After discussion, patient elected to go to physical therapy. Also treat pain with PRN Vicodin.

## 2014-09-28 NOTE — Progress Notes (Signed)
   Subjective:    Patient ID: [name], female    DOB: [birth year], 74 y.o.   MRN: [id number]  HPI 74 year old female with a complicated past medical history including hypertension, ESRD on hemodialysis, depression (with history of psychotic features), and hyperlipidemia presents for follow-up.  Chief complaint: Right shoulder and wrist pain.  1) R shoulder pain  Patient reports a 3 month history of right shoulder pain with associated decreased range of motion.  Patient denies any recent fall, trauma, injury.  She states the pain encompasses her entire right shoulder.  Pain is exacerbated by movement. She finds difficult to comb her hair, scratch her back, etc.  She's had some relief with NSAIDs.  No known inciting factor.  No radiation.  2) R wrist pain  Patient also endorses right wrist pain.  This is been present for approximately 3 months.  Once again she denies any fall,trauma, injury.  The pain is located on the radial aspect.  She reports associated swelling.  No relieving factors.  She states that the pain is worse in the morning.  Pain is also worse with certain movements/activity.  3) HTN  Home BP Monitoring - No.  Medications - amlodipine 10 g daily, clonidine 0.2 mg twice a day, losartan 100 mg daily (this medication was recently added by her nephrologist).  Compliance -  Yes  ROS: Denies chest pain, SOB. Patient states that her blood pressure is frequently elevated. She denies any hypo-tension during or following dialysis.   4) Canker sores  Patient reports that for the past 2 months she's had a sore in her mouth. She states that this has interfered with her ability to wear her dentures.   She reports associated pain.   She's not seen the dentist in the past several years.   No recent fevers or chills.   Social Hx - Current smoker. History  Substance Use Topics  . Smoking status: Current Every Day Smoker -- 0.10 packs/day for 50 years   Types: Cigarettes    Start date: 06/16/2014  . Smokeless tobacco: Not on file     Comment: pt states she smokes "1" cig per day  . Alcohol Use: No   Review of Systems  Constitutional: Negative.   Respiratory: Negative for shortness of breath.   Cardiovascular: Negative for chest pain.  Musculoskeletal: Positive for arthralgias.      Objective:   Physical Exam Filed Vitals:   09/27/14 1345  BP: 184/59  Pulse: 70  Temp: 97.7 F (36.5 C)   Vital signs reviewed.  Exam: General: Chronically ill-appearing female in no acute distress. Mouth/oral: Small raised lesion with ulceration noted in the inside lower gumline. Shoulder: Right Inspection reveals no abnormalities, atrophy or asymmetry. Palpation: Patient with significant tenderness diffusely around the shoulder. ROM is decreased in all planes particularly in flexion. Rotator cuff strength: Normal except supraspinatus which was 4/5. Positive empty can. Positive painful arc.  Wrist: Right Inspection - swelling noted particularly radially. ROM decreased greatly in flexion and extension secondary to pain. Palpation: Patient was tenderness over the anatomic snuffbox.  Positive Finkelstein's test.     Assessment & Plan:  See problem list

## 2014-09-28 NOTE — Assessment & Plan Note (Signed)
Stable. Continue Lipitor. 

## 2014-09-28 NOTE — Assessment & Plan Note (Signed)
Uncontrolled. Discussed changing/augmenting therapy and patient was very wary of this given prior instances of hypertension during and following dialysis. Patient would like to discuss this with her nephrologist and let him manage her blood pressure medications. I'm in agreement. No changes today.

## 2014-09-29 DIAGNOSIS — N186 End stage renal disease: Secondary | ICD-10-CM | POA: Diagnosis not present

## 2014-09-29 DIAGNOSIS — N2581 Secondary hyperparathyroidism of renal origin: Secondary | ICD-10-CM | POA: Diagnosis not present

## 2014-09-29 DIAGNOSIS — D631 Anemia in chronic kidney disease: Secondary | ICD-10-CM | POA: Diagnosis not present

## 2014-09-29 NOTE — Progress Notes (Unsigned)
PA form faxed to [employer] for review.  Safaa Stingley L, RN  

## 2014-09-30 DIAGNOSIS — Z992 Dependence on renal dialysis: Secondary | ICD-10-CM | POA: Diagnosis not present

## 2014-09-30 DIAGNOSIS — E1129 Type 2 diabetes mellitus with other diabetic kidney complication: Secondary | ICD-10-CM | POA: Diagnosis not present

## 2014-09-30 DIAGNOSIS — N186 End stage renal disease: Secondary | ICD-10-CM | POA: Diagnosis not present

## 2014-10-02 DIAGNOSIS — D631 Anemia in chronic kidney disease: Secondary | ICD-10-CM | POA: Diagnosis not present

## 2014-10-02 DIAGNOSIS — N2581 Secondary hyperparathyroidism of renal origin: Secondary | ICD-10-CM | POA: Diagnosis not present

## 2014-10-02 DIAGNOSIS — N186 End stage renal disease: Secondary | ICD-10-CM | POA: Diagnosis not present

## 2014-10-02 DIAGNOSIS — D509 Iron deficiency anemia, unspecified: Secondary | ICD-10-CM | POA: Diagnosis not present

## 2014-10-04 DIAGNOSIS — N186 End stage renal disease: Secondary | ICD-10-CM | POA: Diagnosis not present

## 2014-10-04 DIAGNOSIS — N2581 Secondary hyperparathyroidism of renal origin: Secondary | ICD-10-CM | POA: Diagnosis not present

## 2014-10-04 DIAGNOSIS — D631 Anemia in chronic kidney disease: Secondary | ICD-10-CM | POA: Diagnosis not present

## 2014-10-04 DIAGNOSIS — D509 Iron deficiency anemia, unspecified: Secondary | ICD-10-CM | POA: Diagnosis not present

## 2014-10-06 DIAGNOSIS — N2581 Secondary hyperparathyroidism of renal origin: Secondary | ICD-10-CM | POA: Diagnosis not present

## 2014-10-06 DIAGNOSIS — N186 End stage renal disease: Secondary | ICD-10-CM | POA: Diagnosis not present

## 2014-10-06 DIAGNOSIS — D631 Anemia in chronic kidney disease: Secondary | ICD-10-CM | POA: Diagnosis not present

## 2014-10-06 DIAGNOSIS — D509 Iron deficiency anemia, unspecified: Secondary | ICD-10-CM | POA: Diagnosis not present

## 2014-10-09 DIAGNOSIS — N2581 Secondary hyperparathyroidism of renal origin: Secondary | ICD-10-CM | POA: Diagnosis not present

## 2014-10-09 DIAGNOSIS — D509 Iron deficiency anemia, unspecified: Secondary | ICD-10-CM | POA: Diagnosis not present

## 2014-10-09 DIAGNOSIS — N186 End stage renal disease: Secondary | ICD-10-CM | POA: Diagnosis not present

## 2014-10-09 DIAGNOSIS — D631 Anemia in chronic kidney disease: Secondary | ICD-10-CM | POA: Diagnosis not present

## 2014-10-11 DIAGNOSIS — N2581 Secondary hyperparathyroidism of renal origin: Secondary | ICD-10-CM | POA: Diagnosis not present

## 2014-10-11 DIAGNOSIS — D509 Iron deficiency anemia, unspecified: Secondary | ICD-10-CM | POA: Diagnosis not present

## 2014-10-11 DIAGNOSIS — D631 Anemia in chronic kidney disease: Secondary | ICD-10-CM | POA: Diagnosis not present

## 2014-10-11 DIAGNOSIS — N186 End stage renal disease: Secondary | ICD-10-CM | POA: Diagnosis not present

## 2014-10-13 DIAGNOSIS — N2581 Secondary hyperparathyroidism of renal origin: Secondary | ICD-10-CM | POA: Diagnosis not present

## 2014-10-13 DIAGNOSIS — D631 Anemia in chronic kidney disease: Secondary | ICD-10-CM | POA: Diagnosis not present

## 2014-10-13 DIAGNOSIS — D509 Iron deficiency anemia, unspecified: Secondary | ICD-10-CM | POA: Diagnosis not present

## 2014-10-13 DIAGNOSIS — N186 End stage renal disease: Secondary | ICD-10-CM | POA: Diagnosis not present

## 2014-10-16 DIAGNOSIS — N186 End stage renal disease: Secondary | ICD-10-CM | POA: Diagnosis not present

## 2014-10-16 DIAGNOSIS — D509 Iron deficiency anemia, unspecified: Secondary | ICD-10-CM | POA: Diagnosis not present

## 2014-10-16 DIAGNOSIS — N2581 Secondary hyperparathyroidism of renal origin: Secondary | ICD-10-CM | POA: Diagnosis not present

## 2014-10-16 DIAGNOSIS — D631 Anemia in chronic kidney disease: Secondary | ICD-10-CM | POA: Diagnosis not present

## 2014-10-18 DIAGNOSIS — N186 End stage renal disease: Secondary | ICD-10-CM | POA: Diagnosis not present

## 2014-10-18 DIAGNOSIS — N2581 Secondary hyperparathyroidism of renal origin: Secondary | ICD-10-CM | POA: Diagnosis not present

## 2014-10-18 DIAGNOSIS — D631 Anemia in chronic kidney disease: Secondary | ICD-10-CM | POA: Diagnosis not present

## 2014-10-18 DIAGNOSIS — D509 Iron deficiency anemia, unspecified: Secondary | ICD-10-CM | POA: Diagnosis not present

## 2014-10-20 DIAGNOSIS — N186 End stage renal disease: Secondary | ICD-10-CM | POA: Diagnosis not present

## 2014-10-20 DIAGNOSIS — D631 Anemia in chronic kidney disease: Secondary | ICD-10-CM | POA: Diagnosis not present

## 2014-10-20 DIAGNOSIS — N2581 Secondary hyperparathyroidism of renal origin: Secondary | ICD-10-CM | POA: Diagnosis not present

## 2014-10-20 DIAGNOSIS — D509 Iron deficiency anemia, unspecified: Secondary | ICD-10-CM | POA: Diagnosis not present

## 2014-10-23 DIAGNOSIS — N186 End stage renal disease: Secondary | ICD-10-CM | POA: Diagnosis not present

## 2014-10-23 DIAGNOSIS — D631 Anemia in chronic kidney disease: Secondary | ICD-10-CM | POA: Diagnosis not present

## 2014-10-23 DIAGNOSIS — N2581 Secondary hyperparathyroidism of renal origin: Secondary | ICD-10-CM | POA: Diagnosis not present

## 2014-10-23 DIAGNOSIS — D509 Iron deficiency anemia, unspecified: Secondary | ICD-10-CM | POA: Diagnosis not present

## 2014-10-25 DIAGNOSIS — N186 End stage renal disease: Secondary | ICD-10-CM | POA: Diagnosis not present

## 2014-10-25 DIAGNOSIS — D509 Iron deficiency anemia, unspecified: Secondary | ICD-10-CM | POA: Diagnosis not present

## 2014-10-25 DIAGNOSIS — N2581 Secondary hyperparathyroidism of renal origin: Secondary | ICD-10-CM | POA: Diagnosis not present

## 2014-10-25 DIAGNOSIS — D631 Anemia in chronic kidney disease: Secondary | ICD-10-CM | POA: Diagnosis not present

## 2014-10-27 DIAGNOSIS — N186 End stage renal disease: Secondary | ICD-10-CM | POA: Diagnosis not present

## 2014-10-27 DIAGNOSIS — N2581 Secondary hyperparathyroidism of renal origin: Secondary | ICD-10-CM | POA: Diagnosis not present

## 2014-10-27 DIAGNOSIS — D509 Iron deficiency anemia, unspecified: Secondary | ICD-10-CM | POA: Diagnosis not present

## 2014-10-27 DIAGNOSIS — D631 Anemia in chronic kidney disease: Secondary | ICD-10-CM | POA: Diagnosis not present

## 2014-10-30 DIAGNOSIS — N186 End stage renal disease: Secondary | ICD-10-CM | POA: Diagnosis not present

## 2014-10-30 DIAGNOSIS — D631 Anemia in chronic kidney disease: Secondary | ICD-10-CM | POA: Diagnosis not present

## 2014-10-30 DIAGNOSIS — N2581 Secondary hyperparathyroidism of renal origin: Secondary | ICD-10-CM | POA: Diagnosis not present

## 2014-10-30 DIAGNOSIS — D509 Iron deficiency anemia, unspecified: Secondary | ICD-10-CM | POA: Diagnosis not present

## 2014-10-31 DIAGNOSIS — Z992 Dependence on renal dialysis: Secondary | ICD-10-CM | POA: Diagnosis not present

## 2014-10-31 DIAGNOSIS — N186 End stage renal disease: Secondary | ICD-10-CM | POA: Diagnosis not present

## 2014-10-31 DIAGNOSIS — E1129 Type 2 diabetes mellitus with other diabetic kidney complication: Secondary | ICD-10-CM | POA: Diagnosis not present

## 2014-11-01 DIAGNOSIS — D509 Iron deficiency anemia, unspecified: Secondary | ICD-10-CM | POA: Diagnosis not present

## 2014-11-01 DIAGNOSIS — N186 End stage renal disease: Secondary | ICD-10-CM | POA: Diagnosis not present

## 2014-11-01 DIAGNOSIS — D631 Anemia in chronic kidney disease: Secondary | ICD-10-CM | POA: Diagnosis not present

## 2014-11-01 DIAGNOSIS — N2581 Secondary hyperparathyroidism of renal origin: Secondary | ICD-10-CM | POA: Diagnosis not present

## 2014-11-03 DIAGNOSIS — D509 Iron deficiency anemia, unspecified: Secondary | ICD-10-CM | POA: Diagnosis not present

## 2014-11-03 DIAGNOSIS — N2581 Secondary hyperparathyroidism of renal origin: Secondary | ICD-10-CM | POA: Diagnosis not present

## 2014-11-03 DIAGNOSIS — D631 Anemia in chronic kidney disease: Secondary | ICD-10-CM | POA: Diagnosis not present

## 2014-11-03 DIAGNOSIS — N186 End stage renal disease: Secondary | ICD-10-CM | POA: Diagnosis not present

## 2014-11-06 DIAGNOSIS — D631 Anemia in chronic kidney disease: Secondary | ICD-10-CM | POA: Diagnosis not present

## 2014-11-06 DIAGNOSIS — N186 End stage renal disease: Secondary | ICD-10-CM | POA: Diagnosis not present

## 2014-11-06 DIAGNOSIS — N2581 Secondary hyperparathyroidism of renal origin: Secondary | ICD-10-CM | POA: Diagnosis not present

## 2014-11-06 DIAGNOSIS — D509 Iron deficiency anemia, unspecified: Secondary | ICD-10-CM | POA: Diagnosis not present

## 2014-11-08 DIAGNOSIS — N186 End stage renal disease: Secondary | ICD-10-CM | POA: Diagnosis not present

## 2014-11-08 DIAGNOSIS — D509 Iron deficiency anemia, unspecified: Secondary | ICD-10-CM | POA: Diagnosis not present

## 2014-11-08 DIAGNOSIS — N2581 Secondary hyperparathyroidism of renal origin: Secondary | ICD-10-CM | POA: Diagnosis not present

## 2014-11-08 DIAGNOSIS — D631 Anemia in chronic kidney disease: Secondary | ICD-10-CM | POA: Diagnosis not present

## 2014-11-10 DIAGNOSIS — N186 End stage renal disease: Secondary | ICD-10-CM | POA: Diagnosis not present

## 2014-11-10 DIAGNOSIS — N2581 Secondary hyperparathyroidism of renal origin: Secondary | ICD-10-CM | POA: Diagnosis not present

## 2014-11-10 DIAGNOSIS — D509 Iron deficiency anemia, unspecified: Secondary | ICD-10-CM | POA: Diagnosis not present

## 2014-11-10 DIAGNOSIS — D631 Anemia in chronic kidney disease: Secondary | ICD-10-CM | POA: Diagnosis not present

## 2014-11-13 ENCOUNTER — Other Ambulatory Visit: Payer: Self-pay | Admitting: Family Medicine

## 2014-11-13 DIAGNOSIS — D509 Iron deficiency anemia, unspecified: Secondary | ICD-10-CM | POA: Diagnosis not present

## 2014-11-13 DIAGNOSIS — N186 End stage renal disease: Secondary | ICD-10-CM | POA: Diagnosis not present

## 2014-11-13 DIAGNOSIS — D631 Anemia in chronic kidney disease: Secondary | ICD-10-CM | POA: Diagnosis not present

## 2014-11-13 DIAGNOSIS — N2581 Secondary hyperparathyroidism of renal origin: Secondary | ICD-10-CM | POA: Diagnosis not present

## 2014-11-15 DIAGNOSIS — N2581 Secondary hyperparathyroidism of renal origin: Secondary | ICD-10-CM | POA: Diagnosis not present

## 2014-11-15 DIAGNOSIS — N186 End stage renal disease: Secondary | ICD-10-CM | POA: Diagnosis not present

## 2014-11-15 DIAGNOSIS — D631 Anemia in chronic kidney disease: Secondary | ICD-10-CM | POA: Diagnosis not present

## 2014-11-15 DIAGNOSIS — D509 Iron deficiency anemia, unspecified: Secondary | ICD-10-CM | POA: Diagnosis not present

## 2014-11-17 DIAGNOSIS — D631 Anemia in chronic kidney disease: Secondary | ICD-10-CM | POA: Diagnosis not present

## 2014-11-17 DIAGNOSIS — D509 Iron deficiency anemia, unspecified: Secondary | ICD-10-CM | POA: Diagnosis not present

## 2014-11-17 DIAGNOSIS — N2581 Secondary hyperparathyroidism of renal origin: Secondary | ICD-10-CM | POA: Diagnosis not present

## 2014-11-17 DIAGNOSIS — N186 End stage renal disease: Secondary | ICD-10-CM | POA: Diagnosis not present

## 2014-11-20 DIAGNOSIS — N2581 Secondary hyperparathyroidism of renal origin: Secondary | ICD-10-CM | POA: Diagnosis not present

## 2014-11-20 DIAGNOSIS — D509 Iron deficiency anemia, unspecified: Secondary | ICD-10-CM | POA: Diagnosis not present

## 2014-11-20 DIAGNOSIS — D631 Anemia in chronic kidney disease: Secondary | ICD-10-CM | POA: Diagnosis not present

## 2014-11-20 DIAGNOSIS — N186 End stage renal disease: Secondary | ICD-10-CM | POA: Diagnosis not present

## 2014-11-22 DIAGNOSIS — N2581 Secondary hyperparathyroidism of renal origin: Secondary | ICD-10-CM | POA: Diagnosis not present

## 2014-11-22 DIAGNOSIS — N186 End stage renal disease: Secondary | ICD-10-CM | POA: Diagnosis not present

## 2014-11-22 DIAGNOSIS — D631 Anemia in chronic kidney disease: Secondary | ICD-10-CM | POA: Diagnosis not present

## 2014-11-22 DIAGNOSIS — D509 Iron deficiency anemia, unspecified: Secondary | ICD-10-CM | POA: Diagnosis not present

## 2014-11-23 ENCOUNTER — Other Ambulatory Visit: Payer: Self-pay | Admitting: Family Medicine

## 2014-11-23 ENCOUNTER — Telehealth: Payer: Self-pay | Admitting: *Deleted

## 2014-11-23 MED ORDER — ABILIFY 2 MG PO TABS: 1.0000 mg | ORAL_TABLET | Freq: Every day | ORAL | Status: DC

## 2014-11-23 NOTE — Telephone Encounter (Signed)
Prior Authorization received from [employer] for Ablilify 2 mg.  PA form placed in provider box for completion. Derl Barrow, RN

## 2014-11-23 NOTE — Telephone Encounter (Signed)
Patient needs her ABILIFY 2 MG tablet  Refilled, however, she states that she needs it changed from half a day to one a day so that the insurance will cover it. Please inform the patient upon completion, thank you, Fonda Kinder, ASA

## 2014-11-24 ENCOUNTER — Other Ambulatory Visit: Payer: Self-pay | Admitting: Family Medicine

## 2014-11-25 DIAGNOSIS — D631 Anemia in chronic kidney disease: Secondary | ICD-10-CM | POA: Diagnosis not present

## 2014-11-25 DIAGNOSIS — D509 Iron deficiency anemia, unspecified: Secondary | ICD-10-CM | POA: Diagnosis not present

## 2014-11-25 DIAGNOSIS — N186 End stage renal disease: Secondary | ICD-10-CM | POA: Diagnosis not present

## 2014-11-25 DIAGNOSIS — N2581 Secondary hyperparathyroidism of renal origin: Secondary | ICD-10-CM | POA: Diagnosis not present

## 2014-11-27 ENCOUNTER — Other Ambulatory Visit: Payer: Self-pay

## 2014-11-27 DIAGNOSIS — D631 Anemia in chronic kidney disease: Secondary | ICD-10-CM | POA: Diagnosis not present

## 2014-11-27 DIAGNOSIS — N2581 Secondary hyperparathyroidism of renal origin: Secondary | ICD-10-CM | POA: Diagnosis not present

## 2014-11-27 DIAGNOSIS — N186 End stage renal disease: Secondary | ICD-10-CM | POA: Diagnosis not present

## 2014-11-27 DIAGNOSIS — D509 Iron deficiency anemia, unspecified: Secondary | ICD-10-CM | POA: Diagnosis not present

## 2014-11-27 NOTE — Telephone Encounter (Signed)
The patient's daughter is calling because the Rx, Abilify, was prescribed wrong. It was sent as half a tablet per day, when it should say 1 whole tablet a day. What was sent was a Rx for her to take half of one a day and only 15 tablets (these tablets cannot be cut in half as they will just crush). Therefore, she will run out in 15 days and will be unable to receive a refill for another 15 days. She would like for this to be fixed at the earliest convenience.

## 2014-11-27 NOTE — Telephone Encounter (Signed)
Contacted pt and informed her of below. Zimmerman Rumple, Lylith Bebeau D, CMA  

## 2014-11-27 NOTE — Telephone Encounter (Signed)
I will send in Rx but patient should have this refilled in the future by her mental health provider.

## 2014-11-28 NOTE — Telephone Encounter (Signed)
Medication approved for 08/27/14-11/25/15.  Approval # [id number].  Too soon to fill med now.  Med available for refill on 12/17/14.  Pharmacist informed.  Burna Forts, BSN, RN-BC

## 2014-11-29 DIAGNOSIS — N186 End stage renal disease: Secondary | ICD-10-CM | POA: Diagnosis not present

## 2014-11-29 DIAGNOSIS — D631 Anemia in chronic kidney disease: Secondary | ICD-10-CM | POA: Diagnosis not present

## 2014-11-29 DIAGNOSIS — N2581 Secondary hyperparathyroidism of renal origin: Secondary | ICD-10-CM | POA: Diagnosis not present

## 2014-11-29 DIAGNOSIS — D509 Iron deficiency anemia, unspecified: Secondary | ICD-10-CM | POA: Diagnosis not present

## 2014-11-30 DIAGNOSIS — Z992 Dependence on renal dialysis: Secondary | ICD-10-CM | POA: Diagnosis not present

## 2014-11-30 DIAGNOSIS — N186 End stage renal disease: Secondary | ICD-10-CM | POA: Diagnosis not present

## 2014-11-30 DIAGNOSIS — E1129 Type 2 diabetes mellitus with other diabetic kidney complication: Secondary | ICD-10-CM | POA: Diagnosis not present

## 2014-12-01 DIAGNOSIS — N2581 Secondary hyperparathyroidism of renal origin: Secondary | ICD-10-CM | POA: Diagnosis not present

## 2014-12-01 DIAGNOSIS — N186 End stage renal disease: Secondary | ICD-10-CM | POA: Diagnosis not present

## 2014-12-01 DIAGNOSIS — D631 Anemia in chronic kidney disease: Secondary | ICD-10-CM | POA: Diagnosis not present

## 2014-12-01 DIAGNOSIS — D509 Iron deficiency anemia, unspecified: Secondary | ICD-10-CM | POA: Diagnosis not present

## 2014-12-04 DIAGNOSIS — D509 Iron deficiency anemia, unspecified: Secondary | ICD-10-CM | POA: Diagnosis not present

## 2014-12-04 DIAGNOSIS — N186 End stage renal disease: Secondary | ICD-10-CM | POA: Diagnosis not present

## 2014-12-04 DIAGNOSIS — N2581 Secondary hyperparathyroidism of renal origin: Secondary | ICD-10-CM | POA: Diagnosis not present

## 2014-12-04 DIAGNOSIS — D631 Anemia in chronic kidney disease: Secondary | ICD-10-CM | POA: Diagnosis not present

## 2014-12-06 DIAGNOSIS — N2581 Secondary hyperparathyroidism of renal origin: Secondary | ICD-10-CM | POA: Diagnosis not present

## 2014-12-06 DIAGNOSIS — N186 End stage renal disease: Secondary | ICD-10-CM | POA: Diagnosis not present

## 2014-12-06 DIAGNOSIS — D631 Anemia in chronic kidney disease: Secondary | ICD-10-CM | POA: Diagnosis not present

## 2014-12-06 DIAGNOSIS — D509 Iron deficiency anemia, unspecified: Secondary | ICD-10-CM | POA: Diagnosis not present

## 2014-12-07 DIAGNOSIS — D509 Iron deficiency anemia, unspecified: Secondary | ICD-10-CM | POA: Diagnosis not present

## 2014-12-07 DIAGNOSIS — N186 End stage renal disease: Secondary | ICD-10-CM | POA: Diagnosis not present

## 2014-12-07 DIAGNOSIS — D631 Anemia in chronic kidney disease: Secondary | ICD-10-CM | POA: Diagnosis not present

## 2014-12-07 DIAGNOSIS — N2581 Secondary hyperparathyroidism of renal origin: Secondary | ICD-10-CM | POA: Diagnosis not present

## 2014-12-08 DIAGNOSIS — N186 End stage renal disease: Secondary | ICD-10-CM | POA: Diagnosis not present

## 2014-12-08 DIAGNOSIS — N2581 Secondary hyperparathyroidism of renal origin: Secondary | ICD-10-CM | POA: Diagnosis not present

## 2014-12-08 DIAGNOSIS — D509 Iron deficiency anemia, unspecified: Secondary | ICD-10-CM | POA: Diagnosis not present

## 2014-12-08 DIAGNOSIS — D631 Anemia in chronic kidney disease: Secondary | ICD-10-CM | POA: Diagnosis not present

## 2014-12-10 ENCOUNTER — Telehealth: Payer: Self-pay | Admitting: Family Medicine

## 2014-12-10 ENCOUNTER — Encounter (HOSPITAL_BASED_OUTPATIENT_CLINIC_OR_DEPARTMENT_OTHER): Payer: Self-pay | Admitting: Emergency Medicine

## 2014-12-10 ENCOUNTER — Emergency Department (HOSPITAL_BASED_OUTPATIENT_CLINIC_OR_DEPARTMENT_OTHER)
Admission: EM | Admit: 2014-12-10 | Discharge: 2014-12-11 | Disposition: A | Discharge status: Home / Self Care | Payer: Medicare Other | Attending: Emergency Medicine | Admitting: Emergency Medicine

## 2014-12-10 DIAGNOSIS — Z720 Tobacco use: Secondary | ICD-10-CM | POA: Insufficient documentation

## 2014-12-10 DIAGNOSIS — Z7951 Long term (current) use of inhaled steroids: Secondary | ICD-10-CM | POA: Diagnosis not present

## 2014-12-10 DIAGNOSIS — K5900 Constipation, unspecified: Secondary | ICD-10-CM | POA: Insufficient documentation

## 2014-12-10 DIAGNOSIS — G2581 Restless legs syndrome: Secondary | ICD-10-CM | POA: Insufficient documentation

## 2014-12-10 DIAGNOSIS — Z79899 Other long term (current) drug therapy: Secondary | ICD-10-CM | POA: Insufficient documentation

## 2014-12-10 DIAGNOSIS — Z86018 Personal history of other benign neoplasm: Secondary | ICD-10-CM | POA: Insufficient documentation

## 2014-12-10 DIAGNOSIS — Z880 Allergy status to penicillin: Secondary | ICD-10-CM | POA: Diagnosis not present

## 2014-12-10 DIAGNOSIS — M79604 Pain in right leg: Secondary | ICD-10-CM | POA: Diagnosis present

## 2014-12-10 DIAGNOSIS — Z862 Personal history of diseases of the blood and blood-forming organs and certain disorders involving the immune mechanism: Secondary | ICD-10-CM | POA: Diagnosis not present

## 2014-12-10 DIAGNOSIS — K589 Irritable bowel syndrome without diarrhea: Secondary | ICD-10-CM | POA: Diagnosis not present

## 2014-12-10 DIAGNOSIS — E079 Disorder of thyroid, unspecified: Secondary | ICD-10-CM | POA: Insufficient documentation

## 2014-12-10 DIAGNOSIS — M79651 Pain in right thigh: Secondary | ICD-10-CM | POA: Diagnosis not present

## 2014-12-10 DIAGNOSIS — M1990 Unspecified osteoarthritis, unspecified site: Secondary | ICD-10-CM | POA: Insufficient documentation

## 2014-12-10 DIAGNOSIS — Z992 Dependence on renal dialysis: Secondary | ICD-10-CM | POA: Diagnosis not present

## 2014-12-10 DIAGNOSIS — E785 Hyperlipidemia, unspecified: Secondary | ICD-10-CM | POA: Diagnosis not present

## 2014-12-10 DIAGNOSIS — M1611 Unilateral primary osteoarthritis, right hip: Secondary | ICD-10-CM | POA: Diagnosis not present

## 2014-12-10 DIAGNOSIS — M25559 Pain in unspecified hip: Secondary | ICD-10-CM | POA: Diagnosis not present

## 2014-12-10 DIAGNOSIS — Z7982 Long term (current) use of aspirin: Secondary | ICD-10-CM | POA: Diagnosis not present

## 2014-12-10 DIAGNOSIS — N186 End stage renal disease: Secondary | ICD-10-CM | POA: Diagnosis not present

## 2014-12-10 DIAGNOSIS — M47816 Spondylosis without myelopathy or radiculopathy, lumbar region: Secondary | ICD-10-CM | POA: Diagnosis not present

## 2014-12-10 DIAGNOSIS — M25551 Pain in right hip: Secondary | ICD-10-CM | POA: Insufficient documentation

## 2014-12-10 NOTE — ED Notes (Signed)
Pt in c/o worsening chronic leg pain x 1 day. Localizes pain to R lateral thigh. Pt is dialysis pt with fistula in L arm.

## 2014-12-10 NOTE — Telephone Encounter (Signed)
Family Medicine After hours phone call  Daughter calls for patient. Patient has been having a lot of pain in her right leg for the past week (starting around Monday). She is a dialysis patient and has been going to dialysis. Pain feels like a cramp in her right thigh, no leg swelling, no redness or warmth to the leg, leg is not cold. She has tried taking OTCs, heating pads, ice during the week but the pain got worse in the past day so she took a total of 3 hydrocodone (does not take these chronically, this prescription was from April and she has not really used it). I discussed with her that if the pain is as bad as she is describing she should go to the emergency room to be evaluated and treated. Discussed that it would be okay to take 2 hydrocodone at a time and try that but if it isn't controlled she should go to the ED. Pt wanted to try the hydrocodone, sleep, and call the clinic in the AM but verbalized that if the pain persisted she would go to the ED.  Tawanna Sat, MD 12/10/2014, 9:20 PM PGY-3, [place]

## 2014-12-11 ENCOUNTER — Emergency Department (HOSPITAL_BASED_OUTPATIENT_CLINIC_OR_DEPARTMENT_OTHER): Payer: Medicare Other

## 2014-12-11 DIAGNOSIS — M1611 Unilateral primary osteoarthritis, right hip: Secondary | ICD-10-CM | POA: Diagnosis not present

## 2014-12-11 DIAGNOSIS — M25551 Pain in right hip: Secondary | ICD-10-CM | POA: Diagnosis not present

## 2014-12-11 DIAGNOSIS — M47816 Spondylosis without myelopathy or radiculopathy, lumbar region: Secondary | ICD-10-CM | POA: Diagnosis not present

## 2014-12-11 LAB — BASIC METABOLIC PANEL
Anion gap: 13 (ref 5–15)
BUN: 34 mg/dL — ABNORMAL HIGH (ref 6–20)
CALCIUM: 8.5 mg/dL — AB (ref 8.9–10.3)
CO2: 28 mmol/L (ref 22–32)
Chloride: 96 mmol/L — ABNORMAL LOW (ref 101–111)
Creatinine, Ser: 8.6 mg/dL — ABNORMAL HIGH (ref 0.44–1.00)
GFR calc Af Amer: 5 mL/min — ABNORMAL LOW (ref >60)
GFR, EST NON AFRICAN AMERICAN: 4 mL/min — AB (ref >60)
Glucose, Bld: 115 mg/dL — ABNORMAL HIGH (ref 65–99)
Potassium: 4.2 mmol/L (ref 3.5–5.1)
SODIUM: 137 mmol/L (ref 135–145)

## 2014-12-11 LAB — CBC WITH DIFFERENTIAL/PLATELET
BASOS PCT: 0 % (ref 0–1)
Basophils Absolute: 0 10*3/uL (ref 0.0–0.1)
Eosinophils Absolute: 0.2 10*3/uL (ref 0.0–0.7)
Eosinophils Relative: 2 % (ref 0–5)
HCT: 31.4 % — ABNORMAL LOW (ref 36.0–46.0)
Hemoglobin: 10.3 g/dL — ABNORMAL LOW (ref 12.0–15.0)
LYMPHS PCT: 11 % — AB (ref 12–46)
Lymphs Abs: 1.2 10*3/uL (ref 0.7–4.0)
MCH: 29.3 pg (ref 26.0–34.0)
MCHC: 32.8 g/dL (ref 30.0–36.0)
MCV: 89.2 fL (ref 78.0–100.0)
MONO ABS: 0.6 10*3/uL (ref 0.1–1.0)
MONOS PCT: 6 % (ref 3–12)
NEUTROS ABS: 8.6 10*3/uL — AB (ref 1.7–7.7)
Neutrophils Relative %: 81 % — ABNORMAL HIGH (ref 43–77)
Platelets: 153 10*3/uL (ref 150–400)
RBC: 3.52 MIL/uL — ABNORMAL LOW (ref 3.87–5.11)
RDW: 16.7 % — ABNORMAL HIGH (ref 11.5–15.5)
WBC: 10.7 10*3/uL — ABNORMAL HIGH (ref 4.0–10.5)

## 2014-12-11 LAB — I-STAT CG4 LACTIC ACID, ED: Lactic Acid, Venous: 1.22 mmol/L (ref 0.5–2.0)

## 2014-12-11 MED ORDER — MORPHINE SULFATE 4 MG/ML IJ SOLN
4.0000 mg | Freq: Once | INTRAMUSCULAR | Status: AC
  Administered 2014-12-11: 4 mg via INTRAVENOUS
  Filled 2014-12-11: qty 1

## 2014-12-11 MED ORDER — MORPHINE SULFATE 2 MG/ML IJ SOLN
1.0000 mg | Freq: Once | INTRAMUSCULAR | Status: AC
  Administered 2014-12-11: 1 mg via INTRAVENOUS
  Filled 2014-12-11: qty 1

## 2014-12-11 NOTE — ED Notes (Signed)
Dr. Christy Gentles notified of O2 sat decrease. Pt placed on 3 liters of O2, sats increased to 94-95%-pt easily arousable

## 2014-12-11 NOTE — Discharge Instructions (Signed)

## 2014-12-11 NOTE — ED Provider Notes (Signed)
CSN: [id number]     Arrival date & time 12/10/14  2336 History  This chart was scribed for Ripley Fraise, MD by Chester Holstein, ED Scribe. This patient was seen in room MH01/MH01 and the patient's care was started at 12:08 AM.    Chief Complaint  Patient presents with  . Leg Pain     Patient is a 74 y.o. female presenting with leg pain. The history is provided by the patient. No language interpreter was used.  Leg Pain Location:  Leg Time since incident:  1 week Injury: no   Leg location:  R upper leg Pain details:    Quality:  Cramping   Radiates to:  Groin (right hip)   Severity:  Severe   Onset quality:  Sudden   Duration:  7 days   Timing:  Constant   Progression:  Worsening Chronicity:  New Prior injury to area:  No Relieved by:  Nothing Worsened by:  Bearing weight Ineffective treatments: hydrocodone. Associated symptoms: back pain and muscle weakness   Associated symptoms: no fever    HPI Comments: [name] is a 74 y.o. female with PMHx of HLD, CKD, HTN, adrenal mass, and thyroid disease who presents to the Emergency Department complaining of 10/10 intermittent anterior upper right leg pain with onset 7 days ago, worsening 3 days ago, and becoming constant today. She states she was dialyzing and leg began to cramp at onset. She notes similar episodes of cramping during dialysis 4 days ago and 3 days ago. Pt dialyzes MWF. She notes pain radiates into hip and groin.  Pt reports pain is aggravated by ambulation but she is able to ambulate. Noted decreased ROM secondary to pain. Pt has tried hydrocodone with brief relief to 6/10 after second dose. She notes associated weakness and low back pain. She denies current pain to be similar to previous back pain. Pt denies recent falls or injuries, fever, abdominal pain, bowel and bladder incontinence, numbness, dizziness, generalized weakness, lightheadedness, and LOC.  Past Medical History  Diagnosis Date  . Hyperlipidemia    . Normal cardiac stress test 12/24/2009    lexiscan, imaging normal  . 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 Diverticulitis   . IBS (irritable bowel syndrome)   . Thyroid disease   . Depression   . Chronic kidney disease     Hemo MWF  . GERD (gastroesophageal reflux disease)   . H/O hiatal hernia   . Arthritis   . RLS (restless legs syndrome)   . Tubular adenoma of colon 01/2008  . Seizures     2004  . Constipation   . Sinus complaint   . Dialysis patient     kidney  . Renal disorder   . Adrenal mass   . Hypertension   . High cholesterol   . Back pain    Past Surgical History  Procedure Laterality Date  . Cardiac catheterization  2003    normal  . Cholecystectomy      Open mid line incision  . Frontal craniotomy  2002    indication = sinusitis  . Appendectomy    . Tubal ligation    . Abdominal hysterectomy    . Insertion of dialysis catheter      Left  . Revison of arteriovenous fistula  05/11/2012    Procedure: REVISON OF ARTERIOVENOUS FISTULA;  Surgeon: Elam Dutch, MD;  Location: [facility];  Service: Vascular;  Laterality: Right;  . Av fistula placement Left 11/11/2012    Procedure: INSERTION OF ARTERIOVENOUS (AV) GORE-TEX GRAFT ARM;  Surgeon: Angelia Mould, MD;  Location: [place];  Service: Vascular;  Laterality: Left;  . Avgg removal Left 11/18/2012    Procedure: REMOVAL OF LEFT UPPER ARM ARTERIOVENOUS GORETEX GRAFT ([place]);  Surgeon: Angelia Mould, MD;  Location: [place];  Service: Vascular;  Laterality: Left;  . Patch angioplasty Left 11/18/2012    Procedure: PATCH ANGIOPLASTY;  Surgeon: Angelia Mould, MD;  Location: [place];  Service: Vascular;  Laterality: Left;  . Frontal cranialcity  2002   Family History  Problem Relation Age of Onset  . Thyroid cancer Mother   . Heart disease Father   . Hypertension Father   . Heart attack Father   . Breast cancer Sister   . Lupus Daughter    History   Substance Use Topics  . Smoking status: Current Every Day Smoker -- 0.10 packs/day for 50 years    Types: Cigarettes    Start date: 06/16/2014  . Smokeless tobacco: Not on file     Comment: pt states she smokes "1" cig per day  . Alcohol Use: No   OB History    Gravida Para Term Preterm AB TAB SAB Ectopic Multiple Living   5 3 3             Review of Systems  Constitutional: Negative for fever.  Gastrointestinal: Negative for abdominal pain and bowel incontinence.  Genitourinary: Negative for bladder incontinence.  Musculoskeletal: Positive for myalgias and back pain.  Neurological: Positive for weakness. Negative for dizziness, syncope, light-headedness and numbness.  All other systems reviewed and are negative.     Allergies  Codeine; Codeine; Tuberculin tests; Penicillins; and Sulfamethoxazole  Home Medications   Prior to Admission medications   Medication Sig Start Date End Date Taking? Authorizing Provider  ABILIFY 2 MG tablet Take 0.5 tablets (1 mg total) by mouth daily. 11/23/14   Coral Spikes, MD  allopurinol (ZYLOPRIM) 100 MG tablet TAKE ONE TABLET BY MOUTH ONCE DAILY ON  THE  DAYS  FOLLOWING  DIALYSIS  (TUESDAY,  THURSDAY  AND  SATURDAYS) 06/27/14   Tamela Oddi Hess, DO  amLODipine (NORVASC) 10 MG tablet TAKE ONE TABLET BY MOUTH ONCE DAILY 09/25/14   Coral Spikes, MD  aspirin EC 81 MG tablet Take 81 mg by mouth every morning.    Historical Provider, MD  atorvastatin (LIPITOR) 40 MG tablet TAKE ONE TABLET BY MOUTH ONCE DAILY 11/13/14   Coral Spikes, MD  cinacalcet (SENSIPAR) 30 MG tablet Take 30 mg by mouth at bedtime.    Historical Provider, MD  cloNIDine (CATAPRES) 0.2 MG tablet Take 0.2 mg by mouth 2 (two) times daily.    Historical Provider, MD  cyclobenzaprine (FLEXERIL) 5 MG tablet Take 1 tablet (5 mg total) by mouth 3 (three) times daily as needed for muscle spasms. 06/25/14   Tamela Oddi Hess, DO  fluticasone (FLONASE) 50 MCG/ACT nasal spray Place 2 sprays into both  nostrils daily as needed for allergies or rhinitis.     Historical Provider, MD  folic acid-vitamin b complex-vitamin c-selenium-zinc (DIALYVITE) 3 MG TABS tablet Take 1 tablet by mouth daily.    Historical Provider, MD  HYDROcodone-acetaminophen (NORCO/VICODIN) 5-325 MG per tablet Take 1 tablet by mouth every 6 (six) hours as needed for moderate pain. 09/27/14   Coral Spikes, MD  lidocaine-prilocaine (EMLA) cream Apply 1 application topically 3 (three)  times a week. Use before dialysis on MWF    Historical Provider, MD  loratadine (CLARITIN) 10 MG tablet Take 1 tablet (10 mg total) by mouth daily as needed for allergies. 09/27/14   Coral Spikes, MD  losartan (COZAAR) 100 MG tablet Take 1 tablet (100 mg total) by mouth daily. 09/27/14   Coral Spikes, MD  metoprolol succinate (TOPROL-XL) 100 MG 24 hr tablet Take 100 mg by mouth at bedtime. Take with or immediately following a meal.    Historical Provider, MD  omeprazole (PRILOSEC) 20 MG capsule Take 20 mg by mouth 2 (two) times daily.    Historical Provider, MD  pregabalin (LYRICA) 75 MG capsule Take 75 mg by mouth every evening.     Historical Provider, MD  senna (SENOKOT) 8.6 MG tablet Take 2 tablets by mouth daily as needed. For constipation    Historical Provider, MD  sevelamer carbonate (RENVELA) 800 MG tablet Take 2,400 mg by mouth 3 (three) times daily with meals.    Historical Provider, MD  Sulfuric Acid-Sulf Phenolics 69-45 % SOLN Use as indicated once for your mouth lesion. 09/27/14   Coral Spikes, MD   BP 177/57 mmHg  Pulse 73  Temp(Src) 98.3 F (36.8 C) (Oral)  Resp 20  SpO2 93% Physical Exam  Nursing note and vitals reviewed. CONSTITUTIONAL: Well developed/well nourished HEAD: Normocephalic/atraumatic EYES: EOMI/PERRL ENMT: Mucous membranes moist NECK: supple no meningeal signs SPINE/BACK:lumbar tenderness noted CV: S1/S2 noted, no murmurs/rubs/gallops noted LUNGS: Lungs are clear to auscultation bilaterally, no apparent  distress ABDOMEN: soft, nontender, no rebound or guarding, bowel sounds noted throughout abdomen GU:no cva tenderness NEURO: Pt is awake/alert/appropriate, moves all extremitiesx4.  No facial droop.   EXTREMITIES: pulses normal/equal, full ROM; significant tenderness to right thigh and significant tenderness with ROM of right hip; no erythema, swelling/bruising noted, no crepitus; distal pulses intact SKIN: warm, color normal PSYCH: no abnormalities of mood noted, alert and oriented to situation       ED Course  Procedures  DIAGNOSTIC STUDIES: Oxygen Saturation is 93% on room air, normal by my interpretation.    COORDINATION OF CARE: 12:23 AM Discussed treatment plan with patient at beside, the patient agrees with the plan and has no further questions at this time. 2:12 AM Pt improved  She has improved ROM of right hip.  She has less tenderness to palpation of right thigh No skin color changes to thigh.  No edema noted.  Right groin does not reveal any pulsatile mass Will ambulate patient Labs reassuring (lactate normal) 3:07 AM Pt improved She could ambulate and feels improved but did have some pain I offered [facility] pain management. She prefers to go home and f/u in 48 hours with PCP Patient/family agreeable with plan   Labs Review Labs Reviewed  CBC WITH DIFFERENTIAL/PLATELET - Abnormal; Notable for the following:    WBC 10.7 (*)    RBC 3.52 (*)    Hemoglobin 10.3 (*)    HCT 31.4 (*)    RDW 16.7 (*)    Neutrophils Relative % 81 (*)    Neutro Abs 8.6 (*)    Lymphocytes Relative 11 (*)    All other components within normal limits  BASIC METABOLIC PANEL - Abnormal; Notable for the following:    Chloride 96 (*)    Glucose, Bld 115 (*)    BUN 34 (*)    Creatinine, Ser 8.60 (*)    Calcium 8.5 (*)    GFR  calc non Af Amer 4 (*)    GFR calc Af Amer 5 (*)    All other components within normal limits  I-STAT CG4 LACTIC ACID, ED    Imaging Review Dg  Lumbar Spine Complete  12/11/2014   CLINICAL DATA:  74 year old female with right upper leg pain  EXAM: LUMBAR SPINE - COMPLETE 4+ VIEW  COMPARISON:  Lumbar MRI dated 11/10/2013  FINDINGS: There is no evidence of acute fracture or subluxation of the lumbar spine. There are multilevel degenerative changes.  There is aortoiliac atherosclerotic disease. 1.5 cm calcific density noted in the right upper abdomen on the frontal projection may represent the gall bladder stone. Surgical clips noted within the pelvis.  IMPRESSION: No acute fracture or subluxation.   Electronically Signed   By: Anner Crete M.D.   On: 12/11/2014 01:55   Dg Hip Unilat With Pelvis 2-3 Views Right  12/11/2014   CLINICAL DATA:  Anterior right upper leg pain onset 7 days ago. Radiates to the hip and groin. Weakness and low back pain. No fall or injury.  EXAM: DG HIP (WITH OR WITHOUT PELVIS) 2-3V RIGHT  COMPARISON:  None.  FINDINGS: Degenerative changes in the lower lumbar spine and hips. No evidence of acute fracture or dislocation in the pelvis or right hip. No focal bone lesion or bone destruction. Vascular calcifications. Surgical clips in the right pelvis.  IMPRESSION: Degenerative changes in the right hip.  No acute bony abnormalities.   Electronically Signed   By: Lucienne Capers M.D.   On: 12/11/2014 01:55   Dg Femur, Min 2 Views Right  12/11/2014   CLINICAL DATA:  Anterior right upper leg pain onset 7 days ago. Pain radiates to the hip in the groin. Weakness and low back pain. No fall or injury.  EXAM: RIGHT FEMUR 2 VIEWS  COMPARISON:  None.  FINDINGS: Degenerative changes in the right hip with narrowing and sclerosis in the superior acetabular joint and osteophytes on the femoral head. Degenerative changes in the right knee. No significant effusion. No evidence of acute fracture or dislocation. No focal bone lesion or bone destruction. Bone cortex appears intact. Vascular calcifications. Surgical clips in the right pelvis.   IMPRESSION: Degenerative changes in the right hip and knee. No acute bony abnormalities.   Electronically Signed   By: Lucienne Capers M.D.   On: 12/11/2014 01:54    Medications  morphine 4 MG/ML injection 4 mg (4 mg Intravenous Given 12/11/14 0108)     MDM   Final diagnoses:  Pain in joint involving right pelvic region and thigh    Nursing notes including past medical history and social history reviewed and considered in documentation xrays/imaging reviewed by myself and considered during evaluation Labs/vital reviewed myself and considered during evaluation EPIC phone records reviewed  I personally performed the services described in this documentation, which was scribed in my presence. The recorded information has been reviewed and is accurate.        Ripley Fraise, MD 12/11/14 [phone]

## 2014-12-12 ENCOUNTER — Ambulatory Visit (INDEPENDENT_AMBULATORY_CARE_PROVIDER_SITE_OTHER): Payer: Medicare Other | Admitting: Family Medicine

## 2014-12-12 ENCOUNTER — Encounter: Payer: Self-pay | Admitting: Family Medicine

## 2014-12-12 VITALS — BP 138/84 | HR 67 | Temp 98.3°F | Ht 62.0 in | Wt 182.0 lb

## 2014-12-12 DIAGNOSIS — D509 Iron deficiency anemia, unspecified: Secondary | ICD-10-CM | POA: Diagnosis not present

## 2014-12-12 DIAGNOSIS — M79651 Pain in right thigh: Secondary | ICD-10-CM | POA: Diagnosis not present

## 2014-12-12 DIAGNOSIS — D631 Anemia in chronic kidney disease: Secondary | ICD-10-CM | POA: Diagnosis not present

## 2014-12-12 DIAGNOSIS — N2581 Secondary hyperparathyroidism of renal origin: Secondary | ICD-10-CM | POA: Diagnosis not present

## 2014-12-12 DIAGNOSIS — N186 End stage renal disease: Secondary | ICD-10-CM | POA: Diagnosis not present

## 2014-12-12 MED ORDER — CYCLOBENZAPRINE HCL 5 MG PO TABS: 5.0000 mg | ORAL_TABLET | Freq: Three times a day (TID) | ORAL | Status: DC | PRN

## 2014-12-12 NOTE — Patient Instructions (Signed)
Thank you for coming in,   Use an rolling pin to roll out the pain in your leg. Ice before doing this.   Call me on Friday or next week if the pain continues or is getting worse.   We may need to get an MRI if this continues.   Please bring all of your medications with you to each visit.    Please feel free to call with any questions or concerns at any time, at [phone]. --Dr. Raeford Razor

## 2014-12-12 NOTE — Progress Notes (Signed)
   Subjective:    Patient ID: [name], female    DOB: [birth year], 74 y.o.   MRN: [id number]  Seen for Same day visit for   CC: pain in right thigh  She has been experiencing this pain constantly. No prior injury. She was evaluated in the ED on 7/11.  Pain is improved with hydrocodone.  She reports to having cramps during her dialysis sessions.   Location: right mid thigh  Pain started: week. Pain is: getting worse  Severity: dull  Medications tried: hydrocodone  Recent trauma: no Similar pain previously: no  Symptoms Redness: no Swelling:no Fever: no Weakness: no Weight loss: no Rash: no  PMH - Smoking status noted.    Review of Systems   See HPI for ROS. Objective:  BP 190/54 mmHg  Pulse 67  Temp(Src) 98.3 F (36.8 C) (Oral)  Ht 5\' 2"  (1.575 m)  Wt 182 lb (82.555 kg)  BMI 33.28 kg/m2  General: NAD Skin: warm and dry, no rashes noted Neuro: alert and oriented, no focal deficits MSK: point tenderness to the right lateral thigh at mid shaft of the femur.  No overlying erythema or ecchymosis. Full hip ROM and Knee FROM.     Assessment & Plan:  See Problem List Documentation

## 2014-12-13 ENCOUNTER — Telehealth: Payer: Self-pay | Admitting: Family Medicine

## 2014-12-13 NOTE — Telephone Encounter (Signed)
Forwarding to Dr. Raeford Razor who saw and evaluated Mrs. Cartmell on 7/12.

## 2014-12-13 NOTE — Telephone Encounter (Signed)
Pt would like to get MRI for her back Please advise

## 2014-12-14 ENCOUNTER — Ambulatory Visit (INDEPENDENT_AMBULATORY_CARE_PROVIDER_SITE_OTHER): Payer: Medicare Other | Admitting: Adult Health

## 2014-12-14 ENCOUNTER — Encounter: Payer: Self-pay | Admitting: Adult Health

## 2014-12-14 VITALS — BP 152/69 | HR 62 | Ht 62.0 in | Wt 181.0 lb

## 2014-12-14 DIAGNOSIS — M79604 Pain in right leg: Secondary | ICD-10-CM

## 2014-12-14 DIAGNOSIS — M549 Dorsalgia, unspecified: Secondary | ICD-10-CM | POA: Diagnosis not present

## 2014-12-14 DIAGNOSIS — M79651 Pain in right thigh: Secondary | ICD-10-CM

## 2014-12-14 NOTE — Progress Notes (Signed)
I have read the note, and I agree with the clinical assessment and plan.  Dell Briner KEITH   

## 2014-12-14 NOTE — Patient Instructions (Signed)
We will do NCS with EMG.  Treatment pending those results.

## 2014-12-14 NOTE — Progress Notes (Addendum)
PATIENT: [name] DOB: [birth year]  REASON FOR VISIT: follow up- bowel incontinence, right lower extremity weakness, right foot drop HISTORY FROM: patient  HISTORY OF PRESENT ILLNESS: [name] is a 74 year old female with a history of bowel incontinence, right lower extremity weakness and right foot drop. She returns today complaining of pain in the right thigh. She states that this pain occurred while she was at dialysis. She states that it felt like her muscle was cramping as if they had taken off too much fluid. This sensation did not go away rather started hurting in her lower right back radiating down to the thigh. She states that she is in constant pain even when she is sitting but the pain is worse when she stands. Denies any injury or trauma to the lower back or leg. The patient did go to the emergency room where they did an x-ray that was unremarkable. She also went to her primary care doctor and was prescribed oxycodone and Flexeril. She states that she has to take this throughout the day to tolerate the pain. She does state that she does not like being on these medications because it causes her to feel sleepy. In the past the patient has had a lumbar MRI that was abnormal. She was sent for a referral with an orthopedic physician and was told that they could not do anything. 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 Patient is on dialysis for end-stage renal disease. She states that this occurred due to high blood pressure and steroid injections. She state that she had a steroid injection and then quickly after that her kidneys began to fail.  Denies any other new medical conditions. Turns today for an evaluation.   HISTORY 07/13/14: [name] is a 74 year old female with a history of bowel incontinence. She returns today for follow-up. Patient had a lumbar MRI that showed disc protrusion with some stenosis- she was referred to orthopedic surgeon. She reports that she went there but they did not see any need for her to be there? The patient states that she has only had 1 episode of bowel incontinence in the last 3 months. She contributes that to a stressful situation. the patient is also on dialysis -was hospitalized in October due complications from a missed dialysis treatment. She original came in for a right foot drop and was wearing an AFO brace. She states that she is no longer wearing it- she states that a "miracle" has happened and her nerves in that foot as been healed and no longer needs the AFO brace. She states that she now has increased sensation in that foot than she did before. She states at first she was able to wiggle her toes and then she could rotate her ankle. She ambulates without assistance and denies any falls. She states that sometimes she is unsteady. 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 Patient does smoke cigarettes- she states that she has tried to quit but has been unsuccessful- she would like to try something that would help her.   HISTORY 11/10/13 ([facility]): States intermittent episodes of bowel incontinence for the past few  months but recently has started to occur more frequently, in the past few days it is occuring daily. Has no warning, is unaware until after the BM happens. Reports a chronic history of urinary incontinence (ongoing 'for years) but more lately occuring more frequently, again comes without warning. Notes some paresthesias, pain/burning sensation in the distal lower extremities. She denies any saddle anesthesia. No recent back  trauma, no falls. Notes some mild lower back pain.  [name] returns for follow up of continued weakness in her right foot with a noted foot drop. Since last visit she has had am EMG/NCS, MRI of thoracic spine and has been fitted for a right AFO. MRI thoracic spine was unremarkable. Notes good improvement in ambulation with addition of AFO. Continues to work with PT though will temporarily stop due to ongoing medical concerns with her dialysis. No acute concerns at this time  REVIEW OF SYSTEMS: Out of a complete 14 system review of symptoms, the patient complains only of the following symptoms, and all other reviewed systems are negative.  Appetite change, cold intolerance, nausea, restless leg, insomnia, back pain, aching muscles, muscle cramps, walking difficulty  ALLERGIES: Allergies  Allergen Reactions  . Codeine Nausea And Vomiting  . Codeine Other (See Comments)    unknown  . Tuberculin Tests Hives    "blisters"  . Penicillins Rash    No problems breathing. Has tolerated omnicef in past without issue  . Sulfamethoxazole Rash    HOME MEDICATIONS: Outpatient Prescriptions Prior to Visit  Medication Sig Dispense Refill  . ABILIFY 2 MG tablet Take 0.5 tablets (1 mg total) by mouth daily. 90 tablet 3  . allopurinol (ZYLOPRIM) 100 MG tablet TAKE ONE TABLET BY MOUTH ONCE DAILY ON  THE  DAYS  FOLLOWING  DIALYSIS  (TUESDAY,  THURSDAY  AND  SATURDAYS) 30 tablet 11  . amLODipine (NORVASC) 10 MG tablet TAKE ONE TABLET BY MOUTH ONCE DAILY 90 tablet 0  . aspirin EC 81 MG tablet Take 81 mg by mouth every morning.    Marland Kitchen atorvastatin (LIPITOR) 40 MG tablet TAKE ONE TABLET BY MOUTH ONCE DAILY 90 tablet 0  . cinacalcet (SENSIPAR) 30 MG tablet Take 30 mg by mouth at bedtime.    . cloNIDine (CATAPRES) 0.2 MG tablet Take 0.2 mg by mouth 2 (two) times daily.    . cyclobenzaprine (FLEXERIL) 5 MG tablet Take 1 tablet (5 mg total) by mouth 3 (three) times daily as needed for muscle spasms. 30 tablet 0    . fluticasone (FLONASE) 50 MCG/ACT nasal spray Place 2 sprays into both nostrils daily as needed for allergies or rhinitis.     . folic acid-vitamin b complex-vitamin c-selenium-zinc (DIALYVITE) 3 MG TABS tablet Take 1 tablet by mouth daily.    Marland Kitchen HYDROcodone-acetaminophen (NORCO/VICODIN) 5-325 MG per tablet Take 1 tablet by mouth every 6 (six) hours as needed for moderate pain. 30 tablet 0  . lidocaine-prilocaine (EMLA) cream Apply 1 application topically 3 (three) times a week. Use before dialysis on MWF    . loratadine (CLARITIN) 10 MG tablet Take 1 tablet (10 mg total) by mouth daily as needed for allergies. 90 tablet 3  . losartan (COZAAR) 100 MG tablet Take 1 tablet (100 mg total) by mouth daily. 90 tablet 3  . metoprolol succinate (TOPROL-XL) 100 MG 24 hr tablet Take 100 mg by mouth at bedtime. Take with or immediately following a meal.    . omeprazole (PRILOSEC) 20 MG capsule Take 20 mg by mouth 2 (  two) times daily.    . pregabalin (LYRICA) 75 MG capsule Take 75 mg by mouth every evening.     . senna (SENOKOT) 8.6 MG tablet Take 2 tablets by mouth daily as needed. For constipation    . sevelamer carbonate (RENVELA) 800 MG tablet Take 2,400 mg by mouth 3 (three) times daily with meals.    . Sulfuric Acid-Sulf Phenolics 16-10 % SOLN Use as indicated once for your mouth lesion. 1 each 0   No facility-administered medications prior to visit.    PAST MEDICAL HISTORY: Past Medical History  Diagnosis Date  . Hyperlipidemia   . Normal cardiac stress test 12/24/2009    lexiscan, imaging normal  . 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 Diverticulitis   . IBS (irritable bowel syndrome)   . Thyroid disease   . Depression   . Chronic kidney disease     Hemo MWF  . GERD (gastroesophageal reflux disease)   . H/O hiatal hernia   . Arthritis   . RLS (restless legs syndrome)   . Tubular adenoma of colon 01/2008  . Seizures     2004  . Constipation   .  Sinus complaint   . Dialysis patient     kidney  . Renal disorder   . Adrenal mass   . Hypertension   . High cholesterol   . Back pain     PAST SURGICAL HISTORY: Past Surgical History  Procedure Laterality Date  . Cardiac catheterization  2003    normal  . Cholecystectomy      Open mid line incision  . Frontal craniotomy  2002    indication = sinusitis  . Appendectomy    . Tubal ligation    . Abdominal hysterectomy    . Insertion of dialysis catheter      Left  . Revison of arteriovenous fistula  05/11/2012    Procedure: REVISON OF ARTERIOVENOUS FISTULA;  Surgeon: Elam Dutch, MD;  Location: [facility];  Service: Vascular;  Laterality: Right;  . Av fistula placement Left 11/11/2012    Procedure: INSERTION OF ARTERIOVENOUS (AV) GORE-TEX GRAFT ARM;  Surgeon: Angelia Mould, MD;  Location: [facility];  Service: Vascular;  Laterality: Left;  . Avgg removal Left 11/18/2012    Procedure: REMOVAL OF LEFT UPPER ARM ARTERIOVENOUS GORETEX GRAFT ([place]);  Surgeon: Angelia Mould, MD;  Location: [facility];  Service: Vascular;  Laterality: Left;  . Patch angioplasty Left 11/18/2012    Procedure: PATCH ANGIOPLASTY;  Surgeon: Angelia Mould, MD;  Location: [facility];  Service: Vascular;  Laterality: Left;  . Frontal cranialcity  2002    FAMILY HISTORY: Family History  Problem Relation Age of Onset  . Thyroid cancer Mother   . Heart disease Father   . Hypertension Father   . Heart attack Father   . Breast cancer Sister   . Lupus Daughter     SOCIAL HISTORY: History   Social History  . Marital Status: Single    Spouse Name: N/A  . Number of Children: 3  . Years of Education: 13   Occupational History  . retired    Social History Main Topics  . Smoking status: Current Every Day Smoker -- 0.10 packs/day for 50 years    Types: Cigarettes    Start date: 06/16/2014  . Smokeless tobacco: Never Used     Comment: pt states she smokes "1" cig per day  .  Alcohol Use: No  .  Drug Use: No     Comment: No hx of IV drug use  . Sexual Activity: No   Other Topics Concern  . Not on file   Social History Narrative   ** Merged History Encounter **       Lives in [place] with Daughter, granddaughters (twins) and mentally challenged niece.   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   Patient has a college education.   Patient drinks one cup of caffeine - once a week.                  PHYSICAL EXAM  Filed Vitals:   12/14/14 1346  BP: 152/69  Pulse: 62  Height: 5\' 2"  (1.575 m)  Weight: 181 lb (82.101 kg)   Body mass index is 33.1 kg/(m^2).  Generalized: Well developed, in no acute distress   Neurological examination  Mentation: Alert oriented to time, place, history taking. Follows all commands speech and language fluent. She is drowsy from pain medication.  Cranial nerve II-XII: Pupils were equal round reactive to light. Extraocular movements were full, visual field were full on confrontational test. Facial sensation and strength were normal. Uvula tongue midline. Head turning and shoulder shrug  were normal and symmetric. Motor: The motor testing reveals 5 over 5 strength in the upper extremities and left lower extremity. Patient does have mild proximal weakness in the right leg. Good symmetric motor tone is noted throughout.  Sensory: Sensory testing is intact to soft touch on all 4 extremities. No evidence of extinction is noted.  Coordination: Cerebellar testing reveals good finger-nose-finger and heel-to-shin bilaterally.  Gait and station: Gait is slow and cautious. She uses a cane to ambulate. Tandem gait not attempted.   Reflexes: Deep tendon reflexes are symmetric and normal bilaterally.   DIAGNOSTIC DATA (LABS, IMAGING, TESTING) - I reviewed patient records, labs, notes, testing and imaging myself where available.  Lab Results  Component Value Date   WBC 10.7* 12/11/2014   HGB 10.3* 12/11/2014   HCT 31.4* 12/11/2014   MCV  89.2 12/11/2014   PLT 153 12/11/2014      Component Value Date/Time   NA 137 12/11/2014 0110   NA 136 07/07/2013 1334   K 4.2 12/11/2014 0110   K 4.0 07/07/2013 1334   CL 96* 12/11/2014 0110   CL 100 07/07/2013 1334   CO2 28 12/11/2014 0110   CO2 29 07/07/2013 1334   GLUCOSE 115* 12/11/2014 0110   GLUCOSE 75 07/07/2013 1334   BUN 34* 12/11/2014 0110   BUN 30* 07/07/2013 1334   CREATININE 8.60* 12/11/2014 0110   CREATININE 7.07* 07/07/2013 1334   CREATININE 4.92* 03/11/2012 1218   CREATININE 3.19* 05/06/2010 1349   CALCIUM 8.5* 12/11/2014 0110   CALCIUM 10.0 07/07/2013 1334   CALCIUM 8.4 05/06/2010 0515   PROT 8.5* 03/12/2014 1257   ALBUMIN 2.9* 03/14/2014 0322   AST 19 03/12/2014 1257   ALT 15 03/12/2014 1257   ALKPHOS 124* 03/12/2014 1257   BILITOT 0.3 03/12/2014 1257   GFRNONAA 4* 12/11/2014 0110   GFRNONAA 5* 07/07/2013 1334   GFRAA 5* 12/11/2014 0110   GFRAA 6* 07/07/2013 1334   Lab Results  Component Value Date   CHOL 154 05/01/2010   HDL 36* 05/01/2010   LDLCALC 96 05/01/2010   LDLDIRECT 76 08/13/2011   TRIG 109 05/01/2010   CHOLHDL 4.3 Ratio 05/01/2010         ASSESSMENT AND PLAN 74 y.o.  year old female  has a past medical history of Hyperlipidemia; Normal cardiac stress test (12/24/2009); ANEMIA NEC (03/31/2007); Diverticulitis; IBS (irritable bowel syndrome); Thyroid disease; Depression; Chronic kidney disease; GERD (gastroesophageal reflux disease); H/O hiatal hernia; Arthritis; RLS (restless legs syndrome); Tubular adenoma of colon (01/2008); Seizures; Constipation; Sinus complaint; Dialysis patient; Renal disorder; Adrenal mass; Hypertension; High cholesterol; and Back pain. here with:  1. Right lower back pain radiating to the thigh. 2. Pain in the right thigh  Patient is experiencing new pain in the right lower back that extends to the right thigh. I have consulted with Dr. Jannifer Franklin and at this time we will order a nerve conduction study with EMG.  Prednisone was considered however the patient is in end-stage renal disease and states that this was caused by a steroid injection. We will not order at this time. We will await results from nerve conduction studies before deciding on further treatment. Patient advised that if her symptoms worsen or she develops new symptoms she should let us know. She will follow-up in 3-4 months.   Ward Givens, MSN, NP-C 12/14/2014, 1:49 PM [facility] [address], [place], [place] [address] [phone]  Note: This document was prepared with digital dictation and possible smart phrase technology. Any transcriptional errors that result from this process are unintentional.

## 2014-12-15 ENCOUNTER — Encounter: Payer: Self-pay | Admitting: Family Medicine

## 2014-12-15 DIAGNOSIS — N2581 Secondary hyperparathyroidism of renal origin: Secondary | ICD-10-CM | POA: Diagnosis not present

## 2014-12-15 DIAGNOSIS — D631 Anemia in chronic kidney disease: Secondary | ICD-10-CM | POA: Diagnosis not present

## 2014-12-15 DIAGNOSIS — M79659 Pain in unspecified thigh: Secondary | ICD-10-CM | POA: Insufficient documentation

## 2014-12-15 DIAGNOSIS — N186 End stage renal disease: Secondary | ICD-10-CM | POA: Diagnosis not present

## 2014-12-15 DIAGNOSIS — D509 Iron deficiency anemia, unspecified: Secondary | ICD-10-CM | POA: Diagnosis not present

## 2014-12-15 NOTE — Assessment & Plan Note (Signed)
Used the ultrasound and didn't observe any swelling or suggestions of a torn muscle.  Pain is 10/10 and improved to 7/10 with narcotic.  Able to ambulate  Could be a strain vs spasm vs ruptured muscle vs some underlying cause  - will add flexeril 5 mg to take daily  - may need to perform further imaging if pain continues

## 2014-12-15 NOTE — Telephone Encounter (Signed)
Left VM for patient. If she calls back please have her speak with a nurse/CMA and ask she her pain has improved at all or worsened.  I was thinking of getting an MRI of her thigh and not her back. Is there a reason as to why she is asking for an MRI of her back?    If any questions then please take the best time and phone number to call and I will try to call her back.   Rosemarie Ax, MD PGY-3, [facility] 12/15/2014, 4:13 PM

## 2014-12-18 DIAGNOSIS — D509 Iron deficiency anemia, unspecified: Secondary | ICD-10-CM | POA: Diagnosis not present

## 2014-12-18 DIAGNOSIS — N2581 Secondary hyperparathyroidism of renal origin: Secondary | ICD-10-CM | POA: Diagnosis not present

## 2014-12-18 DIAGNOSIS — N186 End stage renal disease: Secondary | ICD-10-CM | POA: Diagnosis not present

## 2014-12-18 DIAGNOSIS — D631 Anemia in chronic kidney disease: Secondary | ICD-10-CM | POA: Diagnosis not present

## 2014-12-18 DIAGNOSIS — R6889 Other general symptoms and signs: Secondary | ICD-10-CM | POA: Diagnosis not present

## 2014-12-18 DIAGNOSIS — I10 Essential (primary) hypertension: Secondary | ICD-10-CM | POA: Diagnosis not present

## 2014-12-19 ENCOUNTER — Telehealth: Payer: Self-pay | Admitting: Neurology

## 2014-12-19 MED ORDER — CARBAMAZEPINE 100 MG PO CHEW: 100.0000 mg | CHEWABLE_TABLET | Freq: Two times a day (BID) | ORAL | Status: DC

## 2014-12-19 NOTE — Telephone Encounter (Signed)
Pts daughter called back and is still concerned with her mothers pain. She wants to know if she can have anything for pain. Please call and advise [phone]

## 2014-12-19 NOTE — Telephone Encounter (Signed)
I called the patient's daughter. The patient is still in terrible low back/right thigh pain. She has been taking Norco, but the pain is still between 7-9. The note from Megan's office visit with the patient on 7/14 states that we would wait until the EMG/NVC is complete before prescribing anything else due to the patient's renal disease. There are currently no EMG/NVC openings any earlier than 7/26 when she is scheduled. Is there anything else she could have for the pain in the meantime?

## 2014-12-19 NOTE — Telephone Encounter (Signed)
I called the patient. The patient has severe pain in the right thigh with a burning sensation, pain goes up into the hip, not down below the knee. The patient is on hemodialysis. The patient is already on hydrocodone which is not controlling the pain, she is also on Lyrica. The patient is getting drowsy on the hydrocodone. I will give a trial on carbamazepine. EMG and nerve conduction study evaluation is pending. The patient is unable to take prednisone secondary to hallucinations, psychosis.

## 2014-12-19 NOTE — Telephone Encounter (Signed)
I called the patient's daughter and apologized for not getting back in touch with her yet. I explained that Dr. Jannifer Franklin has not had a chance to look into this yet, but would do so once he was done seeing patients. She was okay with this and thanked me for calling.

## 2014-12-19 NOTE — Telephone Encounter (Signed)
Patient's [name]) called stating patient has been in severe pain all weekend. She has taken hydrocodone with no relief. The pain level is between 7-9. She would like something to give relief. Please call and advise. She can be reached at [phone].

## 2014-12-20 ENCOUNTER — Telehealth: Payer: Self-pay | Admitting: Neurology

## 2014-12-20 DIAGNOSIS — D509 Iron deficiency anemia, unspecified: Secondary | ICD-10-CM | POA: Diagnosis not present

## 2014-12-20 DIAGNOSIS — D631 Anemia in chronic kidney disease: Secondary | ICD-10-CM | POA: Diagnosis not present

## 2014-12-20 DIAGNOSIS — N186 End stage renal disease: Secondary | ICD-10-CM | POA: Diagnosis not present

## 2014-12-20 DIAGNOSIS — N2581 Secondary hyperparathyroidism of renal origin: Secondary | ICD-10-CM | POA: Diagnosis not present

## 2014-12-20 NOTE — Telephone Encounter (Signed)
I called the patient's daughter. She wanted to know if she could still give the patient Norco. I advised her that it is fine to give her Norco. Dr. Jannifer Franklin' last note acknowledges she is on Norco and he added the Tegretol to help with the pain.

## 2014-12-20 NOTE — Telephone Encounter (Signed)
Pt calling about medication carbamazepine (TEGRETOL) 100 MG chewable tablet, states that it is only lasting about 4-5 hrs. Would like to know if there is something else . Please call and advise [phone]. Pt says she is in a lot of pain .

## 2014-12-22 ENCOUNTER — Emergency Department (HOSPITAL_BASED_OUTPATIENT_CLINIC_OR_DEPARTMENT_OTHER): Payer: Medicare Other

## 2014-12-22 ENCOUNTER — Emergency Department (HOSPITAL_BASED_OUTPATIENT_CLINIC_OR_DEPARTMENT_OTHER)
Admission: EM | Admit: 2014-12-22 | Discharge: 2014-12-23 | Disposition: A | Discharge status: Home / Self Care | Payer: Medicare Other | Attending: Emergency Medicine | Admitting: Emergency Medicine

## 2014-12-22 ENCOUNTER — Encounter (HOSPITAL_BASED_OUTPATIENT_CLINIC_OR_DEPARTMENT_OTHER): Payer: Self-pay | Admitting: *Deleted

## 2014-12-22 DIAGNOSIS — K5900 Constipation, unspecified: Secondary | ICD-10-CM | POA: Insufficient documentation

## 2014-12-22 DIAGNOSIS — Z720 Tobacco use: Secondary | ICD-10-CM | POA: Insufficient documentation

## 2014-12-22 DIAGNOSIS — Z79899 Other long term (current) drug therapy: Secondary | ICD-10-CM | POA: Diagnosis not present

## 2014-12-22 DIAGNOSIS — Z880 Allergy status to penicillin: Secondary | ICD-10-CM | POA: Diagnosis not present

## 2014-12-22 DIAGNOSIS — M1990 Unspecified osteoarthritis, unspecified site: Secondary | ICD-10-CM | POA: Diagnosis not present

## 2014-12-22 DIAGNOSIS — E780 Pure hypercholesterolemia: Secondary | ICD-10-CM | POA: Insufficient documentation

## 2014-12-22 DIAGNOSIS — R52 Pain, unspecified: Secondary | ICD-10-CM

## 2014-12-22 DIAGNOSIS — Z7982 Long term (current) use of aspirin: Secondary | ICD-10-CM | POA: Diagnosis not present

## 2014-12-22 DIAGNOSIS — D649 Anemia, unspecified: Secondary | ICD-10-CM | POA: Insufficient documentation

## 2014-12-22 DIAGNOSIS — Z86018 Personal history of other benign neoplasm: Secondary | ICD-10-CM | POA: Insufficient documentation

## 2014-12-22 DIAGNOSIS — Z8659 Personal history of other mental and behavioral disorders: Secondary | ICD-10-CM | POA: Insufficient documentation

## 2014-12-22 DIAGNOSIS — D509 Iron deficiency anemia, unspecified: Secondary | ICD-10-CM | POA: Diagnosis not present

## 2014-12-22 DIAGNOSIS — N186 End stage renal disease: Secondary | ICD-10-CM | POA: Diagnosis not present

## 2014-12-22 DIAGNOSIS — N189 Chronic kidney disease, unspecified: Secondary | ICD-10-CM | POA: Diagnosis not present

## 2014-12-22 DIAGNOSIS — Z992 Dependence on renal dialysis: Secondary | ICD-10-CM | POA: Diagnosis not present

## 2014-12-22 DIAGNOSIS — I129 Hypertensive chronic kidney disease with stage 1 through stage 4 chronic kidney disease, or unspecified chronic kidney disease: Secondary | ICD-10-CM | POA: Insufficient documentation

## 2014-12-22 DIAGNOSIS — R203 Hyperesthesia: Secondary | ICD-10-CM | POA: Insufficient documentation

## 2014-12-22 DIAGNOSIS — E785 Hyperlipidemia, unspecified: Secondary | ICD-10-CM | POA: Insufficient documentation

## 2014-12-22 DIAGNOSIS — D631 Anemia in chronic kidney disease: Secondary | ICD-10-CM | POA: Diagnosis not present

## 2014-12-22 DIAGNOSIS — N2581 Secondary hyperparathyroidism of renal origin: Secondary | ICD-10-CM | POA: Diagnosis not present

## 2014-12-22 DIAGNOSIS — M79651 Pain in right thigh: Secondary | ICD-10-CM | POA: Diagnosis not present

## 2014-12-22 DIAGNOSIS — M25551 Pain in right hip: Secondary | ICD-10-CM | POA: Diagnosis present

## 2014-12-22 DIAGNOSIS — K219 Gastro-esophageal reflux disease without esophagitis: Secondary | ICD-10-CM | POA: Diagnosis not present

## 2014-12-22 MED ORDER — KETOROLAC TROMETHAMINE 60 MG/2ML IM SOLN
60.0000 mg | Freq: Once | INTRAMUSCULAR | Status: AC
  Administered 2014-12-22: 60 mg via INTRAMUSCULAR
  Filled 2014-12-22: qty 2

## 2014-12-22 NOTE — ED Provider Notes (Signed)
CSN: [id number]     Arrival date & time 12/22/14  2317 History  This chart was scribed for Lonzo Saulter, MD by Meriel Pica, ED Scribe. This patient was seen in room MH03/MH03 and the patient's care was started 11:41 PM.   Chief Complaint  Patient presents with  . Leg Pain   Patient is a 74 y.o. female presenting with leg pain. The history is provided by the patient. No language interpreter was used.  Leg Pain Location:  Hip Injury: no   Hip location:  R hip Pain details:    Quality:  Shooting   Radiates to:  R leg   Severity:  Severe   Onset quality:  Gradual   Timing:  Constant   Progression:  Worsening Chronicity:  Chronic Dislocation: no   Foreign body present:  No foreign bodies Prior injury to area:  No Relieved by:  Nothing Exacerbated by: all movement. Ineffective treatments:  Ice, heat, movement and rest Associated symptoms: no back pain, no decreased ROM, no fatigue, no fever, no muscle weakness, no numbness, no stiffness, no swelling and no tingling   Risk factors: no concern for non-accidental trauma    HPI Comments: [name] is a 74 y.o. female, with a PMhx of chronic right hip pain, who presents to the Emergency Department complaining of an exacerbation of her chronic, shooting right hip pain that radiates down her right leg.  Per family member the pt has been evaluated multiple times prior to this visit for the same complaint. She had an MRI 3 days ago that was unremarkable. She has had [place] and CT scans and plain films as well.  The pt is scheduled for a second nerve test on Tuesday 7/26. Family member notes pt has taken hydrocodone pta with no relief.  Pt recently started on neurontin and lyrica recently. Denies radiation of the pain into her back.   Past Medical History  Diagnosis Date  . Hyperlipidemia   . Normal cardiac stress test 12/24/2009    lexiscan, imaging normal  . 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  Diverticulitis   . IBS (irritable bowel syndrome)   . Thyroid disease   . Depression   . Chronic kidney disease     Hemo MWF  . GERD (gastroesophageal reflux disease)   . H/O hiatal hernia   . Arthritis   . RLS (restless legs syndrome)   . Tubular adenoma of colon 01/2008  . Seizures     2004  . Constipation   . Sinus complaint   . Dialysis patient     kidney  . Renal disorder   . Adrenal mass   . Hypertension   . High cholesterol   . Back pain    Past Surgical History  Procedure Laterality Date  . Cardiac catheterization  2003    normal  . Cholecystectomy      Open mid line incision  . Frontal craniotomy  2002    indication = sinusitis  . Appendectomy    . Tubal ligation    . Abdominal hysterectomy    . Insertion of dialysis catheter      Left  . Revison of arteriovenous fistula  05/11/2012    Procedure: REVISON OF ARTERIOVENOUS FISTULA;  Surgeon: Elam Dutch, MD;  Location: [facility];  Service: Vascular;  Laterality: Right;  . Av fistula placement Left 11/11/2012    Procedure: INSERTION OF ARTERIOVENOUS (AV)  GORE-TEX GRAFT ARM;  Surgeon: Angelia Mould, MD;  Location: [facility];  Service: Vascular;  Laterality: Left;  . Avgg removal Left 11/18/2012    Procedure: REMOVAL OF LEFT UPPER ARM ARTERIOVENOUS GORETEX GRAFT ([place]);  Surgeon: Angelia Mould, MD;  Location: [facility];  Service: Vascular;  Laterality: Left;  . Patch angioplasty Left 11/18/2012    Procedure: PATCH ANGIOPLASTY;  Surgeon: Angelia Mould, MD;  Location: [facility];  Service: Vascular;  Laterality: Left;  . Frontal cranialcity  2002   Family History  Problem Relation Age of Onset  . Thyroid cancer Mother   . Heart disease Father   . Hypertension Father   . Heart attack Father   . Breast cancer Sister   . Lupus Daughter    History  Substance Use Topics  . Smoking status: Current Every Day Smoker -- 0.10 packs/day for 50 years    Types: Cigarettes    Start date: 06/16/2014  . Smokeless  tobacco: Never Used     Comment: pt states she smokes "1" cig per day  . Alcohol Use: No   OB History    Gravida Para Term Preterm AB TAB SAB Ectopic Multiple Living   5 3 3             Review of Systems  Constitutional: Negative for fever and fatigue.  Respiratory: Negative for cough and shortness of breath.   Cardiovascular: Negative for chest pain, palpitations and leg swelling.  Musculoskeletal: Positive for myalgias and arthralgias. Negative for back pain and stiffness.  All other systems reviewed and are negative.   Allergies  Codeine; Codeine; Tuberculin tests; Penicillins; and Sulfamethoxazole  Home Medications   Prior to Admission medications   Medication Sig Start Date End Date Taking? Authorizing Provider  ABILIFY 2 MG tablet Take 0.5 tablets (1 mg total) by mouth daily. 11/23/14   Coral Spikes, DO  allopurinol (ZYLOPRIM) 100 MG tablet TAKE ONE TABLET BY MOUTH ONCE DAILY ON  THE  DAYS  FOLLOWING  DIALYSIS  (TUESDAY,  THURSDAY  AND  SATURDAYS) 06/27/14   Tamela Oddi Hess, DO  amLODipine (NORVASC) 10 MG tablet TAKE ONE TABLET BY MOUTH ONCE DAILY 09/25/14   Coral Spikes, DO  aspirin EC 81 MG tablet Take 81 mg by mouth every morning.    Historical Provider, MD  atorvastatin (LIPITOR) 40 MG tablet TAKE ONE TABLET BY MOUTH ONCE DAILY 11/13/14   Coral Spikes, DO  carbamazepine (TEGRETOL) 100 MG chewable tablet Chew 1 tablet (100 mg total) by mouth 2 (two) times daily. 12/19/14   Kathrynn Ducking, MD  cinacalcet (SENSIPAR) 30 MG tablet Take 30 mg by mouth at bedtime.    Historical Provider, MD  cloNIDine (CATAPRES) 0.2 MG tablet Take 0.2 mg by mouth 2 (two) times daily.    Historical Provider, MD  cyclobenzaprine (FLEXERIL) 5 MG tablet Take 1 tablet (5 mg total) by mouth 3 (three) times daily as needed for muscle spasms. 12/12/14   Rosemarie Ax, MD  fluticasone (FLONASE) 50 MCG/ACT nasal spray Place 2 sprays into both nostrils daily as needed for allergies or rhinitis.     Historical  Provider, MD  folic acid-vitamin b complex-vitamin c-selenium-zinc (DIALYVITE) 3 MG TABS tablet Take 1 tablet by mouth daily.    Historical Provider, MD  HYDROcodone-acetaminophen (NORCO/VICODIN) 5-325 MG per tablet Take 1 tablet by mouth every 6 (six) hours as needed for moderate pain. 09/27/14   Coral Spikes, DO  lidocaine-prilocaine (EMLA) cream  Apply 1 application topically 3 (three) times a week. Use before dialysis on MWF    Historical Provider, MD  loratadine (CLARITIN) 10 MG tablet Take 1 tablet (10 mg total) by mouth daily as needed for allergies. 09/27/14   Coral Spikes, DO  losartan (COZAAR) 100 MG tablet Take 1 tablet (100 mg total) by mouth daily. 09/27/14   Coral Spikes, DO  metoprolol succinate (TOPROL-XL) 100 MG 24 hr tablet Take 100 mg by mouth at bedtime. Take with or immediately following a meal.    Historical Provider, MD  omeprazole (PRILOSEC) 20 MG capsule Take 20 mg by mouth 2 (two) times daily.    Historical Provider, MD  pregabalin (LYRICA) 75 MG capsule Take 75 mg by mouth every evening.     Historical Provider, MD  senna (SENOKOT) 8.6 MG tablet Take 2 tablets by mouth daily as needed. For constipation    Historical Provider, MD  sevelamer carbonate (RENVELA) 800 MG tablet Take 2,400 mg by mouth 3 (three) times daily with meals.    Historical Provider, MD  Sulfuric Acid-Sulf Phenolics 50-93 % SOLN Use as indicated once for your mouth lesion. 09/27/14   Jayce G Cook, DO   BP 229/77 mmHg  Pulse 76  Temp(Src) 98.2 F (36.8 C) (Oral)  Resp 20  Ht 5\' 3"  (1.6 m)  Wt 181 lb (82.101 kg)  BMI 32.07 kg/m2  SpO2 99% Physical Exam  Constitutional: She is oriented to person, place, and time. She appears well-developed and well-nourished. No distress.  HENT:  Head: Normocephalic.  Right Ear: External ear normal.  Left Ear: External ear normal.  Eyes: Conjunctivae and EOM are normal. Pupils are equal, round, and reactive to light. Right eye exhibits no discharge. Left eye exhibits  no discharge. No scleral icterus.  Neck: Normal range of motion. Neck supple. No JVD present.  Cardiovascular: Normal rate, regular rhythm and normal heart sounds.   DP 2+ intact achilles tendon.  Pulmonary/Chest: Effort normal and breath sounds normal. No respiratory distress. She has no wheezes.  Abdominal: Soft. Bowel sounds are normal. There is no tenderness. There is no rebound and no guarding.  Musculoskeletal: Normal range of motion. She exhibits no edema or tenderness.       Right hip: Normal. She exhibits normal range of motion, normal strength, no tenderness, no bony tenderness, no swelling, no crepitus, no deformity and no laceration.       Right knee: Normal.       Right ankle: Normal. Achilles tendon normal.       Right foot: Normal.  Neurological: She is alert and oriented to person, place, and time. She has normal reflexes. Coordination normal.  Anterior and posterior drawer test negative. No patellar, alta, or baja.   Skin: Skin is warm. No rash noted. No erythema. No pallor.  Psychiatric: She has a normal mood and affect. Her behavior is normal.  Nursing note and vitals reviewed.   ED Course  Procedures  DIAGNOSTIC STUDIES: Oxygen Saturation is 99% on RA, normal by my interpretation.    COORDINATION OF CARE: 12:01 AM Discussed treatment plan which includes to order [place] of RLE and toradol injection with pt. Pt acknowledges and agrees to plan.   Labs Review Labs Reviewed - No data to display  Imaging Review No results found.   EKG Interpretation None      MDM   Final diagnoses:  Pain    Results for orders placed or performed during the hospital encounter of  12/10/14  CBC with Differential/Platelet  Result Value Ref Range   WBC 10.7 (H) 4.0 - 10.5 K/uL   RBC 3.52 (L) 3.87 - 5.11 MIL/uL   Hemoglobin 10.3 (L) 12.0 - 15.0 g/dL   HCT 31.4 (L) 36.0 - 46.0 %   MCV 89.2 78.0 - 100.0 fL   MCH 29.3 26.0 - 34.0 pg   MCHC 32.8 30.0 - 36.0 g/dL   RDW 16.7 (H)  11.5 - 15.5 %   Platelets 153 150 - 400 K/uL   Neutrophils Relative % 81 (H) 43 - 77 %   Neutro Abs 8.6 (H) 1.7 - 7.7 K/uL   Lymphocytes Relative 11 (L) 12 - 46 %   Lymphs Abs 1.2 0.7 - 4.0 K/uL   Monocytes Relative 6 3 - 12 %   Monocytes Absolute 0.6 0.1 - 1.0 K/uL   Eosinophils Relative 2 0 - 5 %   Eosinophils Absolute 0.2 0.0 - 0.7 K/uL   Basophils Relative 0 0 - 1 %   Basophils Absolute 0.0 0.0 - 0.1 K/uL  Basic metabolic panel  Result Value Ref Range   Sodium 137 135 - 145 mmol/L   Potassium 4.2 3.5 - 5.1 mmol/L   Chloride 96 (L) 101 - 111 mmol/L   CO2 28 22 - 32 mmol/L   Glucose, Bld 115 (H) 65 - 99 mg/dL   BUN 34 (H) 6 - 20 mg/dL   Creatinine, Ser 8.60 (H) 0.44 - 1.00 mg/dL   Calcium 8.5 (L) 8.9 - 10.3 mg/dL   GFR calc non Af Amer 4 (L) >60 mL/min   GFR calc Af Amer 5 (L) >60 mL/min   Anion gap 13 5 - 15  I-Stat CG4 Lactic Acid, ED  Result Value Ref Range   Lactic Acid, Venous 1.22 0.5 - 2.0 mmol/L   Dg Lumbar Spine Complete  12/11/2014   CLINICAL DATA:  74 year old female with right upper leg pain  EXAM: LUMBAR SPINE - COMPLETE 4+ VIEW  COMPARISON:  Lumbar MRI dated 11/10/2013  FINDINGS: There is no evidence of acute fracture or subluxation of the lumbar spine. There are multilevel degenerative changes.  There is aortoiliac atherosclerotic disease. 1.5 cm calcific density noted in the right upper abdomen on the frontal projection may represent the gall bladder stone. Surgical clips noted within the pelvis.  IMPRESSION: No acute fracture or subluxation.   Electronically Signed   By: Anner Crete M.D.   On: 12/11/2014 01:55   US Venous Img Lower Unilateral Right  12/23/2014   CLINICAL DATA:  Subacute onset of right back pain, radiating to the right lateral thigh. Initial encounter.  EXAM: RIGHT LOWER EXTREMITY VENOUS DOPPLER ULTRASOUND  TECHNIQUE: Gray-scale sonography with graded compression, as well as color Doppler and duplex ultrasound were performed to evaluate the  lower extremity deep venous systems from the level of the common femoral vein and including the common femoral, femoral, profunda femoral, popliteal and calf veins including the posterior tibial, peroneal and gastrocnemius veins when visible. The superficial great saphenous vein was also interrogated. Spectral Doppler was utilized to evaluate flow at rest and with distal augmentation maneuvers in the common femoral, femoral and popliteal veins.  COMPARISON:  MRI of the right knee performed 09/12/2005  FINDINGS: Contralateral Common Femoral Vein: Respiratory phasicity is normal and symmetric with the symptomatic side. No evidence of thrombus. Normal compressibility.  Common Femoral Vein: No evidence of thrombus. Normal compressibility, respiratory phasicity and response to augmentation.  Saphenofemoral Junction: No evidence of thrombus.  Normal compressibility and flow on color Doppler imaging.  Profunda Femoral Vein: No evidence of thrombus. Normal compressibility and flow on color Doppler imaging.  Femoral Vein: No evidence of thrombus. Normal compressibility, respiratory phasicity and response to augmentation.  Popliteal Vein: No evidence of thrombus. Normal compressibility, respiratory phasicity and response to augmentation.  Calf Veins: No evidence of thrombus. Normal compressibility and flow on color Doppler imaging. The peroneal vein is not well characterized.  Superficial Great Saphenous Vein: No evidence of thrombus. Normal compressibility and flow on color Doppler imaging.  Venous Reflux:  None.  Other Findings:  None.  IMPRESSION: No evidence of deep venous thrombosis.   Electronically Signed   By: Garald Balding M.D.   On: 12/23/2014 01:10   Dg Hip Unilat With Pelvis 2-3 Views Right  12/11/2014   CLINICAL DATA:  Anterior right upper leg pain onset 7 days ago. Radiates to the hip and groin. Weakness and low back pain. No fall or injury.  EXAM: DG HIP (WITH OR WITHOUT PELVIS) 2-3V RIGHT  COMPARISON:   None.  FINDINGS: Degenerative changes in the lower lumbar spine and hips. No evidence of acute fracture or dislocation in the pelvis or right hip. No focal bone lesion or bone destruction. Vascular calcifications. Surgical clips in the right pelvis.  IMPRESSION: Degenerative changes in the right hip.  No acute bony abnormalities.   Electronically Signed   By: Lucienne Capers M.D.   On: 12/11/2014 01:55   Dg Femur, Min 2 Views Right  12/11/2014   CLINICAL DATA:  Anterior right upper leg pain onset 7 days ago. Pain radiates to the hip in the groin. Weakness and low back pain. No fall or injury.  EXAM: RIGHT FEMUR 2 VIEWS  COMPARISON:  None.  FINDINGS: Degenerative changes in the right hip with narrowing and sclerosis in the superior acetabular joint and osteophytes on the femoral head. Degenerative changes in the right knee. No significant effusion. No evidence of acute fracture or dislocation. No focal bone lesion or bone destruction. Bone cortex appears intact. Vascular calcifications. Surgical clips in the right pelvis.  IMPRESSION: Degenerative changes in the right hip and knee. No acute bony abnormalities.   Electronically Signed   By: Lucienne Capers M.D.   On: 12/11/2014 01:54    Medications  ketorolac (TORADOL) injection 60 mg (60 mg Intramuscular Given 12/22/14 2351)  methocarbamol (ROBAXIN) tablet 1,000 mg (1,000 mg Oral Given 12/23/14 0104)  ondansetron (ZOFRAN-ODT) disintegrating tablet 8 mg (8 mg Oral Given 12/23/14 0105)   Will add voltaren gel to the patient's current regimen.  No DVT bones are normal and patient is on medication for chronic nerve pain.  Recommend follow up with pain management.    I personally performed the services described in this documentation, which was scribed in my presence. The recorded information has been reviewed and is accurate.     Veatrice Kells, MD 12/23/14 [phone]

## 2014-12-22 NOTE — ED Notes (Signed)
Pain in her right hip with radiation down her leg to her knee. Hx of chronic pain in same area.

## 2014-12-23 ENCOUNTER — Encounter (HOSPITAL_BASED_OUTPATIENT_CLINIC_OR_DEPARTMENT_OTHER): Payer: Self-pay | Admitting: Emergency Medicine

## 2014-12-23 DIAGNOSIS — R203 Hyperesthesia: Secondary | ICD-10-CM | POA: Diagnosis not present

## 2014-12-23 DIAGNOSIS — M79651 Pain in right thigh: Secondary | ICD-10-CM | POA: Diagnosis not present

## 2014-12-23 MED ORDER — ONDANSETRON 8 MG PO TBDP
8.0000 mg | ORAL_TABLET | Freq: Once | ORAL | Status: AC
  Administered 2014-12-23: 8 mg via ORAL
  Filled 2014-12-23: qty 1

## 2014-12-23 MED ORDER — DICLOFENAC SODIUM 1 % TD GEL: 4.0000 g | Freq: Four times a day (QID) | TRANSDERMAL | Status: DC

## 2014-12-23 MED ORDER — OXYCODONE HCL 5 MG PO TABS
5.0000 mg | ORAL_TABLET | ORAL | Status: DC | PRN
  Administered 2014-12-23: 5 mg via ORAL
  Filled 2014-12-23: qty 1

## 2014-12-23 MED ORDER — METHOCARBAMOL 500 MG PO TABS
1000.0000 mg | ORAL_TABLET | Freq: Once | ORAL | Status: AC
Start: 2014-12-23 — End: 2014-12-23
  Administered 2014-12-23: 1000 mg via ORAL
  Filled 2014-12-23: qty 2

## 2014-12-25 DIAGNOSIS — N186 End stage renal disease: Secondary | ICD-10-CM | POA: Diagnosis not present

## 2014-12-25 DIAGNOSIS — D631 Anemia in chronic kidney disease: Secondary | ICD-10-CM | POA: Diagnosis not present

## 2014-12-25 DIAGNOSIS — D509 Iron deficiency anemia, unspecified: Secondary | ICD-10-CM | POA: Diagnosis not present

## 2014-12-25 DIAGNOSIS — N2581 Secondary hyperparathyroidism of renal origin: Secondary | ICD-10-CM | POA: Diagnosis not present

## 2014-12-26 ENCOUNTER — Ambulatory Visit (INDEPENDENT_AMBULATORY_CARE_PROVIDER_SITE_OTHER): Payer: Medicare Other | Admitting: Neurology

## 2014-12-26 ENCOUNTER — Emergency Department (HOSPITAL_COMMUNITY): Payer: Medicare Other

## 2014-12-26 ENCOUNTER — Emergency Department (HOSPITAL_COMMUNITY)
Admission: EM | Admit: 2014-12-26 | Discharge: 2014-12-26 | Disposition: A | Discharge status: Home / Self Care | Payer: Medicare Other | Attending: Emergency Medicine | Admitting: Emergency Medicine

## 2014-12-26 ENCOUNTER — Encounter (HOSPITAL_COMMUNITY): Payer: Self-pay | Admitting: *Deleted

## 2014-12-26 ENCOUNTER — Ambulatory Visit (INDEPENDENT_AMBULATORY_CARE_PROVIDER_SITE_OTHER): Payer: Self-pay | Admitting: Neurology

## 2014-12-26 ENCOUNTER — Encounter: Payer: Self-pay | Admitting: Neurology

## 2014-12-26 DIAGNOSIS — Z79899 Other long term (current) drug therapy: Secondary | ICD-10-CM | POA: Diagnosis not present

## 2014-12-26 DIAGNOSIS — Z992 Dependence on renal dialysis: Secondary | ICD-10-CM | POA: Diagnosis not present

## 2014-12-26 DIAGNOSIS — E780 Pure hypercholesterolemia: Secondary | ICD-10-CM | POA: Diagnosis not present

## 2014-12-26 DIAGNOSIS — G2581 Restless legs syndrome: Secondary | ICD-10-CM | POA: Insufficient documentation

## 2014-12-26 DIAGNOSIS — R05 Cough: Secondary | ICD-10-CM | POA: Diagnosis not present

## 2014-12-26 DIAGNOSIS — D6489 Other specified anemias: Secondary | ICD-10-CM | POA: Diagnosis not present

## 2014-12-26 DIAGNOSIS — G629 Polyneuropathy, unspecified: Secondary | ICD-10-CM | POA: Insufficient documentation

## 2014-12-26 DIAGNOSIS — Z9889 Other specified postprocedural states: Secondary | ICD-10-CM | POA: Diagnosis not present

## 2014-12-26 DIAGNOSIS — K5900 Constipation, unspecified: Secondary | ICD-10-CM | POA: Diagnosis not present

## 2014-12-26 DIAGNOSIS — M792 Neuralgia and neuritis, unspecified: Secondary | ICD-10-CM | POA: Diagnosis not present

## 2014-12-26 DIAGNOSIS — E785 Hyperlipidemia, unspecified: Secondary | ICD-10-CM | POA: Diagnosis not present

## 2014-12-26 DIAGNOSIS — M1990 Unspecified osteoarthritis, unspecified site: Secondary | ICD-10-CM | POA: Diagnosis not present

## 2014-12-26 DIAGNOSIS — Z86010 Personal history of colonic polyps: Secondary | ICD-10-CM | POA: Insufficient documentation

## 2014-12-26 DIAGNOSIS — G609 Hereditary and idiopathic neuropathy, unspecified: Secondary | ICD-10-CM

## 2014-12-26 DIAGNOSIS — Z7982 Long term (current) use of aspirin: Secondary | ICD-10-CM | POA: Insufficient documentation

## 2014-12-26 DIAGNOSIS — Z880 Allergy status to penicillin: Secondary | ICD-10-CM | POA: Diagnosis not present

## 2014-12-26 DIAGNOSIS — M79651 Pain in right thigh: Secondary | ICD-10-CM

## 2014-12-26 DIAGNOSIS — K219 Gastro-esophageal reflux disease without esophagitis: Secondary | ICD-10-CM | POA: Diagnosis not present

## 2014-12-26 DIAGNOSIS — Z7951 Long term (current) use of inhaled steroids: Secondary | ICD-10-CM | POA: Diagnosis not present

## 2014-12-26 DIAGNOSIS — R002 Palpitations: Secondary | ICD-10-CM | POA: Diagnosis not present

## 2014-12-26 DIAGNOSIS — R0602 Shortness of breath: Secondary | ICD-10-CM | POA: Diagnosis not present

## 2014-12-26 DIAGNOSIS — R0789 Other chest pain: Secondary | ICD-10-CM | POA: Diagnosis not present

## 2014-12-26 DIAGNOSIS — N186 End stage renal disease: Secondary | ICD-10-CM | POA: Diagnosis not present

## 2014-12-26 DIAGNOSIS — G5711 Meralgia paresthetica, right lower limb: Secondary | ICD-10-CM | POA: Insufficient documentation

## 2014-12-26 DIAGNOSIS — Z720 Tobacco use: Secondary | ICD-10-CM | POA: Insufficient documentation

## 2014-12-26 DIAGNOSIS — R079 Chest pain, unspecified: Secondary | ICD-10-CM | POA: Diagnosis present

## 2014-12-26 DIAGNOSIS — F329 Major depressive disorder, single episode, unspecified: Secondary | ICD-10-CM | POA: Insufficient documentation

## 2014-12-26 DIAGNOSIS — I120 Hypertensive chronic kidney disease with stage 5 chronic kidney disease or end stage renal disease: Secondary | ICD-10-CM | POA: Diagnosis not present

## 2014-12-26 DIAGNOSIS — R1110 Vomiting, unspecified: Secondary | ICD-10-CM | POA: Diagnosis not present

## 2014-12-26 DIAGNOSIS — G40909 Epilepsy, unspecified, not intractable, without status epilepticus: Secondary | ICD-10-CM | POA: Diagnosis not present

## 2014-12-26 HISTORY — DX: Meralgia paresthetica, right lower limb: G57.11

## 2014-12-26 LAB — BASIC METABOLIC PANEL
Anion gap: 11 (ref 5–15)
BUN: 12 mg/dL (ref 6–20)
CO2: 29 mmol/L (ref 22–32)
Calcium: 8.8 mg/dL — ABNORMAL LOW (ref 8.9–10.3)
Chloride: 95 mmol/L — ABNORMAL LOW (ref 101–111)
Creatinine, Ser: 5.07 mg/dL — ABNORMAL HIGH (ref 0.44–1.00)
GFR calc Af Amer: 9 mL/min — ABNORMAL LOW (ref >60)
GFR, EST NON AFRICAN AMERICAN: 8 mL/min — AB (ref >60)
Glucose, Bld: 107 mg/dL — ABNORMAL HIGH (ref 65–99)
Potassium: 3.3 mmol/L — ABNORMAL LOW (ref 3.5–5.1)
Sodium: 135 mmol/L (ref 135–145)

## 2014-12-26 LAB — CBC
HEMATOCRIT: 27.6 % — AB (ref 36.0–46.0)
HEMOGLOBIN: 9.2 g/dL — AB (ref 12.0–15.0)
MCH: 28.7 pg (ref 26.0–34.0)
MCHC: 33.3 g/dL (ref 30.0–36.0)
MCV: 86 fL (ref 78.0–100.0)
PLATELETS: 127 10*3/uL — AB (ref 150–400)
RBC: 3.21 MIL/uL — ABNORMAL LOW (ref 3.87–5.11)
RDW: 17 % — ABNORMAL HIGH (ref 11.5–15.5)
WBC: 8.5 10*3/uL (ref 4.0–10.5)

## 2014-12-26 LAB — I-STAT TROPONIN, ED: Troponin i, poc: 0.03 ng/mL (ref 0.00–0.08)

## 2014-12-26 MED ORDER — CARBAMAZEPINE 200 MG PO TABS: 200.0000 mg | ORAL_TABLET | Freq: Two times a day (BID) | ORAL | Status: DC

## 2014-12-26 MED ORDER — PREDNISONE 10 MG PO TABS: ORAL_TABLET | ORAL | Status: DC

## 2014-12-26 MED ORDER — CARBAMAZEPINE 200 MG PO TABS
100.0000 mg | ORAL_TABLET | Freq: Once | ORAL | Status: AC
  Administered 2014-12-26: 100 mg via ORAL
  Filled 2014-12-26: qty 1

## 2014-12-26 MED ORDER — OXYCODONE-ACETAMINOPHEN 5-325 MG PO TABS: 1.0000 | ORAL_TABLET | ORAL | Status: DC | PRN

## 2014-12-26 NOTE — Progress Notes (Signed)
[name] is a 74 year old patient with a history of onset of severe anterolateral right thigh discomfort that began approximately 3 weeks prior to this evaluation. The patient is on Lyrica, recently started carbamazepine, pain remains quite severe. The patient returns for EMG and nerve conduction study evaluation.  EMG evaluation shows some acute and chronic denervation in the peroneal nerve distribution on the right, no evidence of an overlying lumbosacral radiculopathy is seen.  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  The patient is to increase the carbamazepine taking 200 mg twice daily. The patient is already on Lyrica. She indicates that she is able to take prednisone, she tolerates this medication well, I will call in a prednisone Dosepak for her. She already has a follow-up in October 2016. If the pain does not abate, we will have her come in to check carbamazepine levels, consider getting her set up for nerve blocks for the lateral femoral cutaneous nerve on the right.

## 2014-12-26 NOTE — Progress Notes (Signed)
Please refer to EMG and nerve conduction study procedure note. 

## 2014-12-26 NOTE — ED Notes (Signed)
Pt's family is requesting "Carbazapine" dose that was due at 1am for pt's spasms of her hip and leg.

## 2014-12-26 NOTE — Discharge Instructions (Signed)
Tried taking oxycodone-acetaminophen instead of hydrocodone-acetaminophen for pain. It might not have the same problem with your heart racing. Please make an appointment with the cardiologist to see if a heart monitor might be appropriate for you.  Palpitations A palpitation is the feeling that your heartbeat is irregular or is faster than normal. It may feel like your heart is fluttering or skipping a beat. Palpitations are usually not a serious problem. However, in some cases, you may need further medical evaluation. CAUSES  Palpitations can be caused by:  Smoking.  Caffeine or other stimulants, such as diet pills or energy drinks.  Alcohol.  Stress and anxiety.  Strenuous physical activity.  Fatigue.  Certain medicines.  Heart disease, especially if you have a history of irregular heart rhythms (arrhythmias), such as atrial fibrillation, atrial flutter, or supraventricular tachycardia.  An improperly working pacemaker or defibrillator. DIAGNOSIS  To find the cause of your palpitations, your health care provider will take your medical history and perform a physical exam. Your health care provider may also have you take a test called an ambulatory electrocardiogram (ECG). An ECG records your heartbeat patterns over a 24-hour period. You may also have other tests, such as:  Transthoracic echocardiogram (TTE). During echocardiography, sound waves are used to evaluate how blood flows through your heart.  Transesophageal echocardiogram (TEE).  Cardiac monitoring. This allows your health care provider to monitor your heart rate and rhythm in real time.  Holter monitor. This is a portable device that records your heartbeat and can help diagnose heart arrhythmias. It allows your health care provider to track your heart activity for several days, if needed.  Stress tests by exercise or by giving medicine that makes the heart beat faster. TREATMENT  Treatment of palpitations depends on  the cause of your symptoms and can vary greatly. Most cases of palpitations do not require any treatment other than time, relaxation, and monitoring your symptoms. Other causes, such as atrial fibrillation, atrial flutter, or supraventricular tachycardia, usually require further treatment. HOME CARE INSTRUCTIONS   Avoid:  Caffeinated coffee, tea, soft drinks, diet pills, and energy drinks.  Chocolate.  Alcohol.  Stop smoking if you smoke.  Reduce your stress and anxiety. Things that can help you relax include:  A method of controlling things in your body, such as your heartbeats, with your mind (biofeedback).  Yoga.  Meditation.  Physical activity such as swimming, jogging, or walking.  Get plenty of rest and sleep. SEEK MEDICAL CARE IF:   You continue to have a fast or irregular heartbeat beyond 24 hours.  Your palpitations occur more often. SEEK IMMEDIATE MEDICAL CARE IF:  You have chest pain or shortness of breath.  You have a severe headache.  You feel dizzy or you faint. MAKE SURE YOU:  Understand these instructions.  Will watch your condition.  Will get help right away if you are not doing well or get worse. Document Released: 05/16/2000 Document Revised: 05/24/2013 Document Reviewed: 07/18/2011 [facility] Patient Information 2015 [place], [place]. This information is not intended to replace advice given to you by your health care provider. Make sure you discuss any questions you have with your health care provider.   Acetaminophen; Oxycodone tablets What is this medicine? ACETAMINOPHEN; OXYCODONE (a set a MEE noe fen; ox i KOE done) is a pain reliever. It is used to treat mild to moderate pain. This medicine may be used for other purposes; ask your health care provider or pharmacist if you have questions. COMMON BRAND NAME(S):  Endocet, Magnacet, Narvox, Percocet, Perloxx, Primalev, Primlev, Roxicet, Xolox What should I tell my health care provider before I take  this medicine? They need to know if you have any of these conditions: -brain tumor -Crohn's disease, inflammatory bowel disease, or ulcerative colitis -drug abuse or addiction -head injury -heart or circulation problems -if you often drink alcohol -kidney disease or problems going to the bathroom -liver disease -lung disease, asthma, or breathing problems -an unusual or allergic reaction to acetaminophen, oxycodone, other opioid analgesics, other medicines, foods, dyes, or preservatives -pregnant or trying to get pregnant -breast-feeding How should I use this medicine? Take this medicine by mouth with a full glass of water. Follow the directions on the prescription label. Take your medicine at regular intervals. Do not take your medicine more often than directed. Talk to your pediatrician regarding the use of this medicine in children. Special care may be needed. Patients over 80 years old may have a stronger reaction and need a smaller dose. Overdosage: If you think you have taken too much of this medicine contact a poison control center or emergency room at once. NOTE: This medicine is only for you. Do not share this medicine with others. What if I miss a dose? If you miss a dose, take it as soon as you can. If it is almost time for your next dose, take only that dose. Do not take double or extra doses. What may interact with this medicine? -alcohol -antihistamines -barbiturates like amobarbital, butalbital, butabarbital, methohexital, pentobarbital, phenobarbital, thiopental, and secobarbital -benztropine -drugs for bladder problems like solifenacin, trospium, oxybutynin, tolterodine, hyoscyamine, and methscopolamine -drugs for breathing problems like ipratropium and tiotropium -drugs for certain stomach or intestine problems like propantheline, homatropine methylbromide, glycopyrrolate, atropine, belladonna, and dicyclomine -general anesthetics like etomidate, ketamine, nitrous  oxide, propofol, desflurane, enflurane, halothane, isoflurane, and sevoflurane -medicines for depression, anxiety, or psychotic disturbances -medicines for sleep -muscle relaxants -naltrexone -narcotic medicines (opiates) for pain -phenothiazines like perphenazine, thioridazine, chlorpromazine, mesoridazine, fluphenazine, prochlorperazine, promazine, and trifluoperazine -scopolamine -tramadol -trihexyphenidyl This list may not describe all possible interactions. Give your health care provider a list of all the medicines, herbs, non-prescription drugs, or dietary supplements you use. Also tell them if you smoke, drink alcohol, or use illegal drugs. Some items may interact with your medicine. What should I watch for while using this medicine? Tell your doctor or health care professional if your pain does not go away, if it gets worse, or if you have new or a different type of pain. You may develop tolerance to the medicine. Tolerance means that you will need a higher dose of the medication for pain relief. Tolerance is normal and is expected if you take this medicine for a long time. Do not suddenly stop taking your medicine because you may develop a severe reaction. Your body becomes used to the medicine. This does NOT mean you are addicted. Addiction is a behavior related to getting and using a drug for a non-medical reason. If you have pain, you have a medical reason to take pain medicine. Your doctor will tell you how much medicine to take. If your doctor wants you to stop the medicine, the dose will be slowly lowered over time to avoid any side effects. You may get drowsy or dizzy. Do not drive, use machinery, or do anything that needs mental alertness until you know how this medicine affects you. Do not stand or sit up quickly, especially if you are an older patient. This reduces the risk  of dizzy or fainting spells. Alcohol may interfere with the effect of this medicine. Avoid alcoholic  drinks. There are different types of narcotic medicines (opiates) for pain. If you take more than one type at the same time, you may have more side effects. Give your health care provider a list of all medicines you use. Your doctor will tell you how much medicine to take. Do not take more medicine than directed. Call emergency for help if you have problems breathing. The medicine will cause constipation. Try to have a bowel movement at least every 2 to 3 days. If you do not have a bowel movement for 3 days, call your doctor or health care professional. Do not take Tylenol (acetaminophen) or medicines that have acetaminophen with this medicine. Too much acetaminophen can be very dangerous. Many nonprescription medicines contain acetaminophen. Always read the labels carefully to avoid taking more acetaminophen. What side effects may I notice from receiving this medicine? Side effects that you should report to your doctor or health care professional as soon as possible: -allergic reactions like skin rash, itching or hives, swelling of the face, lips, or tongue -breathing difficulties, wheezing -confusion -light headedness or fainting spells -severe stomach pain -unusually weak or tired -yellowing of the skin or the whites of the eyes Side effects that usually do not require medical attention (report to your doctor or health care professional if they continue or are bothersome): -dizziness -drowsiness -nausea -vomiting This list may not describe all possible side effects. Call your doctor for medical advice about side effects. You may report side effects to FDA at 1-800-FDA-1088. Where should I keep my medicine? Keep out of the reach of children. This medicine can be abused. Keep your medicine in a safe place to protect it from theft. Do not share this medicine with anyone. Selling or giving away this medicine is dangerous and against the law. Store at room temperature between 20 and 25 degrees C  (68 and 77 degrees F). Keep container tightly closed. Protect from light. This medicine may cause accidental overdose and death if it is taken by other adults, children, or pets. Flush any unused medicine down the toilet to reduce the chance of harm. Do not use the medicine after the expiration date. NOTE: This sheet is a summary. It may not cover all possible information. If you have questions about this medicine, talk to your doctor, pharmacist, or health care provider.  2015, [employer]vier/[employer] Standard. (2013-01-10 13:17:35)

## 2014-12-26 NOTE — ED Notes (Signed)
Pt family member called out to express concerns about trash on the floor.  Pt's family member seems agitated and was short with staff.  This RN apologized for the trash

## 2014-12-26 NOTE — ED Notes (Signed)
Pt provided with a [place] sandwich baggie and sprite (okay'd by MD).  Therapeutic conversation had with patient and family about wait.  Pt and family member thankful and appreciative for efforts of EMT and RN

## 2014-12-26 NOTE — ED Notes (Signed)
Pt c/o intermittent CP x 1 week. States that she also started taking Carbamezapine 1 week ago. Associated SOB and nausea. Pt received 7-81 mg ASA prior to arrival. 12 lead by EMS was NSR. Dialysis MWF - received today with no complications. BP 180/80 HR 74 8% RA.

## 2014-12-26 NOTE — Procedures (Signed)
     HISTORY:  [name] is a 74 year old patient with a history of end-stage renal disease on dialysis. She has developed severe pain and paresthesias in the anterolateral aspect of the right thigh. She has a history of a footdrop on the right as well. She has lumbar spondylosis. She is being evaluated for possible neuropathy or a lumbosacral radiculopathy.  NERVE CONDUCTION STUDIES:  Nerve conduction studies were performed on both lower extremities. The study on the right peroneal nerve was unresponsive to the EDB muscle, with pickup was on the tibialis anterior muscle. The distal motor latency was normal, with a normal motor amplitude. The distal motor latency for the left peroneal nerve was normal, with a low motor amplitude. The distal motor latencies for the posterior tibial nerves were normal bilaterally, with a low motor amplitude on the right, normal on the left. The nerve conduction velocities for the peroneal and posterior tibial nerves were normal bilaterally. The peroneal sensory latencies were normal bilaterally. The H reflex latencies were absent bilaterally.  EMG STUDIES:  EMG study was performed on the right lower extremity:  The tibialis anterior muscle reveals 2 to 8K motor units with decreased recruitment. No fibrillations or positive waves were seen. The peroneus tertius muscle reveals 2 to 5K motor units with moderately decreased recruitment. 2+ fibrillations and positive waves were seen. The medial gastrocnemius muscle reveals 1 to 3K motor units with full recruitment. No fibrillations or positive waves were seen. The vastus lateralis muscle reveals 2 to 4K motor units with full recruitment. No fibrillations or positive waves were seen. The iliopsoas muscle reveals 2 to 4K motor units with full recruitment. No fibrillations or positive waves were seen. The biceps femoris muscle (long head) reveals 2 to 4K motor units with full recruitment. No fibrillations or positive  waves were seen. The lumbosacral paraspinal muscles were tested at 3 levels, and revealed no abnormalities of insertional activity at all 3 levels tested. There was good relaxation.   IMPRESSION:  Nerve conduction studies done on both lower extremities show diffuse lowering of motor amplitudes. Sparing of sensory latencies were seen, suggesting that an axonal peripheral neuropathy is not present. EMG evaluation of the right lower extremity shows acute and chronic denervation primarily within the peroneal nerve distribution, without evidence of an overlying lumbosacral radiculopathy. 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  Jill Alexanders MD 12/26/2014 4:26 PM  [facility] [address] [place], [place] [address]  Phone [phone] Fax [phone]

## 2014-12-26 NOTE — ED Provider Notes (Signed)
CSN: [id number]     Arrival date & time 12/26/14  0028 History   First MD Initiated Contact with Patient 12/26/14 [phone]     Chief Complaint  Patient presents with  . Chest Pain     (Consider location/radiation/quality/duration/timing/severity/associated sxs/prior Treatment) Patient is a 74 y.o. female presenting with chest pain. The history is provided by the patient.  Chest Pain She is asked she complaining about episodes of palpitations. She states that her heart race for about 30 minutes. During that time, she has some slight have pressure in her chest but no associated dyspnea, nausea, diaphoresis. She is currently being worked up for leg pain and is scheduled to have an EMG later today. She is also being treated for neuropathic leg pain. She states that the episodes of palpitations tend to occur as she is coming off dialysis, and also with she takes hydrocodone-acetaminophen for pain. Currently, she is symptom-free.  Past Medical History  Diagnosis Date  . Hyperlipidemia   . Normal cardiac stress test 12/24/2009    lexiscan, imaging normal  . 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 Diverticulitis   . IBS (irritable bowel syndrome)   . Thyroid disease   . Depression   . Chronic kidney disease     Hemo MWF  . GERD (gastroesophageal reflux disease)   . H/O hiatal hernia   . Arthritis   . RLS (restless legs syndrome)   . Tubular adenoma of colon 01/2008  . Seizures     2004  . Constipation   . Sinus complaint   . Dialysis patient     kidney  . Renal disorder   . Adrenal mass   . Hypertension   . High cholesterol   . Back pain    Past Surgical History  Procedure Laterality Date  . Cardiac catheterization  2003    normal  . Cholecystectomy      Open mid line incision  . Frontal craniotomy  2002    indication = sinusitis  . Appendectomy    . Tubal ligation    . Abdominal hysterectomy    . Insertion of dialysis catheter      Left   . Revison of arteriovenous fistula  05/11/2012    Procedure: REVISON OF ARTERIOVENOUS FISTULA;  Surgeon: Elam [place], MD;  Location: [place];  Service: Vascular;  Laterality: Right;  . Av fistula placement Left 11/11/2012    Procedure: INSERTION OF ARTERIOVENOUS (AV) GORE-TEX GRAFT ARM;  Surgeon: Angelia Mould, MD;  Location: [place];  Service: Vascular;  Laterality: Left;  . Avgg removal Left 11/18/2012    Procedure: REMOVAL OF LEFT UPPER ARM ARTERIOVENOUS GORETEX GRAFT ([place]);  Surgeon: Angelia Mould, MD;  Location: [place];  Service: Vascular;  Laterality: Left;  . Patch angioplasty Left 11/18/2012    Procedure: PATCH ANGIOPLASTY;  Surgeon: Angelia Mould, MD;  Location: [place];  Service: Vascular;  Laterality: Left;  . Frontal cranialcity  2002   Family History  Problem Relation Age of Onset  . Thyroid cancer Mother   . Heart disease Father   . Hypertension Father   . Heart attack Father   . Breast cancer Sister   . Lupus Daughter    History  Substance Use Topics  . Smoking status: Current Every Day Smoker -- 0.10 packs/day for 50 years    Types: Cigarettes    Start date: 06/16/2014  . Smokeless tobacco:  Never Used     Comment: pt states she smokes "1" cig per day  . Alcohol Use: No   OB History    Gravida Para Term Preterm AB TAB SAB Ectopic Multiple Living   5 3 3             Review of Systems  Cardiovascular: Positive for chest pain.  All other systems reviewed and are negative.     Allergies  Codeine; Codeine; Tuberculin tests; Penicillins; and Sulfamethoxazole  Home Medications   Prior to Admission medications   Medication Sig Start Date End Date Taking? Authorizing Provider  ABILIFY 2 MG tablet Take 0.5 tablets (1 mg total) by mouth daily. 11/23/14  Yes Coral Spikes, DO  allopurinol (ZYLOPRIM) 100 MG tablet TAKE ONE TABLET BY MOUTH ONCE DAILY ON  THE  DAYS  FOLLOWING  DIALYSIS  (TUESDAY,  THURSDAY  AND  SATURDAYS) 06/27/14  Yes Bryan R Hess,  DO  amLODipine (NORVASC) 10 MG tablet TAKE ONE TABLET BY MOUTH ONCE DAILY 09/25/14  Yes Coral Spikes, DO  aspirin EC 81 MG tablet Take 81 mg by mouth every morning.   Yes Historical Provider, MD  atorvastatin (LIPITOR) 40 MG tablet TAKE ONE TABLET BY MOUTH ONCE DAILY 11/13/14  Yes Coral Spikes, DO  carbamazepine (TEGRETOL) 100 MG chewable tablet Chew 1 tablet (100 mg total) by mouth 2 (two) times daily. Patient taking differently: Chew 100 mg by mouth 3 (three) times daily.  12/19/14  Yes Kathrynn Ducking, MD  cinacalcet (SENSIPAR) 30 MG tablet Take 30 mg by mouth at bedtime.   Yes Historical Provider, MD  cloNIDine (CATAPRES) 0.1 MG tablet Take 0.1-0.2 mg by mouth 2 (two) times daily. Take 1 tablet in the morning and 2 tablets in the evening   Yes Historical Provider, MD  cyclobenzaprine (FLEXERIL) 5 MG tablet Take 1 tablet (5 mg total) by mouth 3 (three) times daily as needed for muscle spasms. 12/12/14  Yes Rosemarie Ax, MD  diclofenac sodium (VOLTAREN) 1 % GEL Apply 4 g topically 4 (four) times daily. 12/23/14  Yes April Palumbo, MD  fluticasone [facility]) 50 MCG/ACT nasal spray Place 2 sprays into both nostrils daily as needed for allergies or rhinitis.    Yes Historical Provider, MD  folic acid-vitamin b complex-vitamin c-selenium-zinc (DIALYVITE) 3 MG TABS tablet Take 1 tablet by mouth daily.   Yes Historical Provider, MD  HYDROcodone-acetaminophen (NORCO/VICODIN) 5-325 MG per tablet Take 1 tablet by mouth every 6 (six) hours as needed for moderate pain. 09/27/14  Yes Coral Spikes, DO  lidocaine-prilocaine (EMLA) cream Apply 1 application topically 3 (three) times a week. Use before dialysis on MWF   Yes Historical Provider, MD  loratadine (CLARITIN) 10 MG tablet Take 1 tablet (10 mg total) by mouth daily as needed for allergies. 09/27/14  Yes Coral Spikes, DO  losartan (COZAAR) 100 MG tablet Take 1 tablet (100 mg total) by mouth daily. 09/27/14  Yes Coral Spikes, DO  metoprolol succinate  (TOPROL-XL) 100 MG 24 hr tablet Take 100 mg by mouth at bedtime. Take with or immediately following a meal.   Yes Historical Provider, MD  omeprazole (PRILOSEC) 20 MG capsule Take 20 mg by mouth 2 (two) times daily.   Yes Historical Provider, MD  pregabalin (LYRICA) 75 MG capsule Take 75 mg by mouth every evening.    Yes Historical Provider, MD  senna (SENOKOT) 8.6 MG tablet Take 2 tablets by mouth daily as needed. For constipation  Yes Historical Provider, MD  sevelamer carbonate (RENVELA) 800 MG tablet Take 2,400 mg by mouth 3 (three) times daily with meals as needed (whenever food is consumed).    Yes Historical Provider, MD  Sulfuric Acid-Sulf Phenolics 69-45 % SOLN Use as indicated once for your mouth lesion. 09/27/14   Jayce G Cook, DO   BP 208/64 mmHg  Pulse 73  Temp(Src) 98.3 F (36.8 C) (Oral)  Resp 18  Ht 5\' 3"  (1.6 m)  Wt 181 lb (82.101 kg)  BMI 32.07 kg/m2  SpO2 97% Physical Exam  Nursing note and vitals reviewed.  74 year old female, resting comfortably and in no acute distress. Vital signs are significant for hypertension. Oxygen saturation is 97%, which is normal. Head is normocephalic and atraumatic. PERRLA, EOMI. Oropharynx is clear. Neck is nontender and supple without adenopathy or JVD. Back is nontender and there is no CVA tenderness. Lungs are clear without rales, wheezes, or rhonchi. Chest is nontender. Heart has regular rate and rhythm without murmur. Abdomen is soft, flat, nontender without masses or hepatosplenomegaly and peristalsis is normoactive. Extremities have no cyanosis or edema, full range of motion is present. AV fistula is present in the left upper arm with thrill present. Skin is warm and dry without rash. Neurologic: Mental status is normal, cranial nerves are intact, there are no motor or sensory deficits.  ED Course  Procedures (including critical care time) Labs Review Results for orders placed or performed during the hospital encounter of  03/88/82  Basic metabolic panel  Result Value Ref Range   Sodium 135 135 - 145 mmol/L   Potassium 3.3 (L) 3.5 - 5.1 mmol/L   Chloride 95 (L) 101 - 111 mmol/L   CO2 29 22 - 32 mmol/L   Glucose, Bld 107 (H) 65 - 99 mg/dL   BUN 12 6 - 20 mg/dL   Creatinine, Ser 5.07 (H) 0.44 - 1.00 mg/dL   Calcium 8.8 (L) 8.9 - 10.3 mg/dL   GFR calc non Af Amer 8 (L) >60 mL/min   GFR calc Af Amer 9 (L) >60 mL/min   Anion gap 11 5 - 15  CBC  Result Value Ref Range   WBC 8.5 4.0 - 10.5 K/uL   RBC 3.21 (L) 3.87 - 5.11 MIL/uL   Hemoglobin 9.2 (L) 12.0 - 15.0 g/dL   HCT 27.6 (L) 36.0 - 46.0 %   MCV 86.0 78.0 - 100.0 fL   MCH 28.7 26.0 - 34.0 pg   MCHC 33.3 30.0 - 36.0 g/dL   RDW 17.0 (H) 11.5 - 15.5 %   Platelets 127 (L) 150 - 400 K/uL  I-stat troponin, ED  Result Value Ref Range   Troponin i, poc 0.03 0.00 - 0.08 ng/mL   Comment 3           Imaging Review Dg Chest 2 View  12/26/2014   CLINICAL DATA:  Chest pain, cough and shortness of breath, onset tonight.  EXAM: CHEST  2 VIEW  COMPARISON:  03/13/2014  FINDINGS: Left-sided dialysis catheter, tip at the atrial caval junction. Ill-defined patchy right infrahilar opacity, atelectasis versus early pneumonia. Cardiomediastinal contours are unchanged, heart at the upper limits of normal in size. Mild vascular congestion without pulmonary edema. No pleural effusion or pneumothorax. Mild degenerative change in the spine.  IMPRESSION: Ill-defined patchy right infrahilar opacity, atelectasis versus early pneumonia.   Electronically Signed   By: Jeb Levering M.D.   On: 12/26/2014 01:03     EKG Interpretation  Date/Time:  Tuesday December 26 2014 00:32:37 EDT Ventricular Rate:  64 PR Interval:  184 QRS Duration: 83 QT Interval:  457 QTC Calculation: 471 R Axis:   42 Text Interpretation:  Sinus rhythm Nonspecific ST abnormality When  compared with ECG of 03/02/2014, HEART RATE has decreased Confirmed by  [facility]  MD, Zabrina Brotherton ([id number]) on 12/26/2014 12:36:30 AM       MDM   Final diagnoses:  Palpitations  Neuropathic pain  End-stage renal disease on hemodialysis    Palpitations of uncertain cause. She will need to have Holter monitor or event monitor to determine what the rhythm is. Since she does seem to have problems with hydrocodone, she will be given a prescription for a small number of oxycodone-acetaminophen to see if it produces the same symptoms. She is referred to cardiology for scheduling for event monitor or Holter monitor.    Delora Fuel, MD 14/97/02 [id number]

## 2014-12-27 DIAGNOSIS — N186 End stage renal disease: Secondary | ICD-10-CM | POA: Diagnosis not present

## 2014-12-27 DIAGNOSIS — D509 Iron deficiency anemia, unspecified: Secondary | ICD-10-CM | POA: Diagnosis not present

## 2014-12-27 DIAGNOSIS — N2581 Secondary hyperparathyroidism of renal origin: Secondary | ICD-10-CM | POA: Diagnosis not present

## 2014-12-27 DIAGNOSIS — D631 Anemia in chronic kidney disease: Secondary | ICD-10-CM | POA: Diagnosis not present

## 2014-12-29 DIAGNOSIS — D631 Anemia in chronic kidney disease: Secondary | ICD-10-CM | POA: Diagnosis not present

## 2014-12-29 DIAGNOSIS — D509 Iron deficiency anemia, unspecified: Secondary | ICD-10-CM | POA: Diagnosis not present

## 2014-12-29 DIAGNOSIS — N186 End stage renal disease: Secondary | ICD-10-CM | POA: Diagnosis not present

## 2014-12-29 DIAGNOSIS — N2581 Secondary hyperparathyroidism of renal origin: Secondary | ICD-10-CM | POA: Diagnosis not present

## 2014-12-30 ENCOUNTER — Telehealth: Payer: Self-pay | Admitting: Family Medicine

## 2014-12-30 ENCOUNTER — Emergency Department (HOSPITAL_BASED_OUTPATIENT_CLINIC_OR_DEPARTMENT_OTHER): Payer: Medicare Other

## 2014-12-30 ENCOUNTER — Inpatient Hospital Stay (HOSPITAL_BASED_OUTPATIENT_CLINIC_OR_DEPARTMENT_OTHER): Payer: Medicare Other

## 2014-12-30 ENCOUNTER — Other Ambulatory Visit: Payer: Self-pay | Admitting: Family Medicine

## 2014-12-30 ENCOUNTER — Encounter (HOSPITAL_BASED_OUTPATIENT_CLINIC_OR_DEPARTMENT_OTHER): Payer: Self-pay

## 2014-12-30 ENCOUNTER — Inpatient Hospital Stay (HOSPITAL_BASED_OUTPATIENT_CLINIC_OR_DEPARTMENT_OTHER)
Admission: EM | Admit: 2014-12-30 | Discharge: 2015-01-01 | DRG: 640 | Disposition: A | Discharge status: Home / Self Care | Payer: Medicare Other | Attending: Family Medicine | Admitting: Family Medicine

## 2014-12-30 DIAGNOSIS — Z79899 Other long term (current) drug therapy: Secondary | ICD-10-CM | POA: Diagnosis not present

## 2014-12-30 DIAGNOSIS — F419 Anxiety disorder, unspecified: Secondary | ICD-10-CM | POA: Diagnosis present

## 2014-12-30 DIAGNOSIS — E876 Hypokalemia: Secondary | ICD-10-CM | POA: Diagnosis present

## 2014-12-30 DIAGNOSIS — R569 Unspecified convulsions: Secondary | ICD-10-CM | POA: Diagnosis present

## 2014-12-30 DIAGNOSIS — K5730 Diverticulosis of large intestine without perforation or abscess without bleeding: Secondary | ICD-10-CM | POA: Diagnosis not present

## 2014-12-30 DIAGNOSIS — N2581 Secondary hyperparathyroidism of renal origin: Secondary | ICD-10-CM | POA: Diagnosis present

## 2014-12-30 DIAGNOSIS — R1012 Left upper quadrant pain: Secondary | ICD-10-CM | POA: Diagnosis not present

## 2014-12-30 DIAGNOSIS — K219 Gastro-esophageal reflux disease without esophagitis: Secondary | ICD-10-CM | POA: Diagnosis present

## 2014-12-30 DIAGNOSIS — R1110 Vomiting, unspecified: Secondary | ICD-10-CM | POA: Diagnosis not present

## 2014-12-30 DIAGNOSIS — I509 Heart failure, unspecified: Secondary | ICD-10-CM

## 2014-12-30 DIAGNOSIS — M898X9 Other specified disorders of bone, unspecified site: Secondary | ICD-10-CM | POA: Diagnosis present

## 2014-12-30 DIAGNOSIS — N186 End stage renal disease: Secondary | ICD-10-CM

## 2014-12-30 DIAGNOSIS — E785 Hyperlipidemia, unspecified: Secondary | ICD-10-CM | POA: Diagnosis present

## 2014-12-30 DIAGNOSIS — J9691 Respiratory failure, unspecified with hypoxia: Secondary | ICD-10-CM | POA: Diagnosis present

## 2014-12-30 DIAGNOSIS — J449 Chronic obstructive pulmonary disease, unspecified: Secondary | ICD-10-CM | POA: Diagnosis present

## 2014-12-30 DIAGNOSIS — N2889 Other specified disorders of kidney and ureter: Secondary | ICD-10-CM | POA: Diagnosis present

## 2014-12-30 DIAGNOSIS — R1032 Left lower quadrant pain: Secondary | ICD-10-CM | POA: Diagnosis present

## 2014-12-30 DIAGNOSIS — F17210 Nicotine dependence, cigarettes, uncomplicated: Secondary | ICD-10-CM | POA: Diagnosis present

## 2014-12-30 DIAGNOSIS — R52 Pain, unspecified: Secondary | ICD-10-CM

## 2014-12-30 DIAGNOSIS — J810 Acute pulmonary edema: Secondary | ICD-10-CM | POA: Diagnosis not present

## 2014-12-30 DIAGNOSIS — R634 Abnormal weight loss: Secondary | ICD-10-CM | POA: Diagnosis present

## 2014-12-30 DIAGNOSIS — G2581 Restless legs syndrome: Secondary | ICD-10-CM | POA: Diagnosis present

## 2014-12-30 DIAGNOSIS — R0602 Shortness of breath: Secondary | ICD-10-CM | POA: Diagnosis present

## 2014-12-30 DIAGNOSIS — R109 Unspecified abdominal pain: Secondary | ICD-10-CM | POA: Insufficient documentation

## 2014-12-30 DIAGNOSIS — K5900 Constipation, unspecified: Secondary | ICD-10-CM | POA: Diagnosis present

## 2014-12-30 DIAGNOSIS — N189 Chronic kidney disease, unspecified: Secondary | ICD-10-CM | POA: Diagnosis not present

## 2014-12-30 DIAGNOSIS — J9601 Acute respiratory failure with hypoxia: Secondary | ICD-10-CM | POA: Diagnosis not present

## 2014-12-30 DIAGNOSIS — Z7982 Long term (current) use of aspirin: Secondary | ICD-10-CM | POA: Diagnosis not present

## 2014-12-30 DIAGNOSIS — D649 Anemia, unspecified: Secondary | ICD-10-CM | POA: Diagnosis present

## 2014-12-30 DIAGNOSIS — N261 Atrophy of kidney (terminal): Secondary | ICD-10-CM | POA: Diagnosis not present

## 2014-12-30 DIAGNOSIS — F329 Major depressive disorder, single episode, unspecified: Secondary | ICD-10-CM | POA: Diagnosis present

## 2014-12-30 DIAGNOSIS — J811 Chronic pulmonary edema: Secondary | ICD-10-CM | POA: Diagnosis not present

## 2014-12-30 DIAGNOSIS — I10 Essential (primary) hypertension: Secondary | ICD-10-CM | POA: Diagnosis not present

## 2014-12-30 DIAGNOSIS — J9690 Respiratory failure, unspecified, unspecified whether with hypoxia or hypercapnia: Secondary | ICD-10-CM | POA: Diagnosis present

## 2014-12-30 DIAGNOSIS — I120 Hypertensive chronic kidney disease with stage 5 chronic kidney disease or end stage renal disease: Secondary | ICD-10-CM | POA: Diagnosis present

## 2014-12-30 DIAGNOSIS — Z992 Dependence on renal dialysis: Secondary | ICD-10-CM

## 2014-12-30 DIAGNOSIS — E8770 Fluid overload, unspecified: Principal | ICD-10-CM | POA: Insufficient documentation

## 2014-12-30 DIAGNOSIS — G5710 Meralgia paresthetica, unspecified lower limb: Secondary | ICD-10-CM | POA: Diagnosis present

## 2014-12-30 DIAGNOSIS — E278 Other specified disorders of adrenal gland: Secondary | ICD-10-CM | POA: Diagnosis not present

## 2014-12-30 DIAGNOSIS — E1129 Type 2 diabetes mellitus with other diabetic kidney complication: Secondary | ICD-10-CM | POA: Diagnosis not present

## 2014-12-30 DIAGNOSIS — N281 Cyst of kidney, acquired: Secondary | ICD-10-CM | POA: Diagnosis not present

## 2014-12-30 LAB — URINALYSIS, ROUTINE W REFLEX MICROSCOPIC
BILIRUBIN URINE: NEGATIVE
Glucose, UA: NEGATIVE mg/dL
Hgb urine dipstick: NEGATIVE
Ketones, ur: NEGATIVE mg/dL
Leukocytes, UA: NEGATIVE
NITRITE: NEGATIVE
Protein, ur: 100 mg/dL — AB
SPECIFIC GRAVITY, URINE: 1.009 (ref 1.005–1.030)
UROBILINOGEN UA: 0.2 mg/dL (ref 0.0–1.0)
pH: 7.5 (ref 5.0–8.0)

## 2014-12-30 LAB — CBC WITH DIFFERENTIAL/PLATELET
BASOS ABS: 0 10*3/uL (ref 0.0–0.1)
BASOS PCT: 0 % (ref 0–1)
EOS ABS: 0.2 10*3/uL (ref 0.0–0.7)
EOS PCT: 2 % (ref 0–5)
HEMATOCRIT: 28.2 % — AB (ref 36.0–46.0)
HEMOGLOBIN: 9.2 g/dL — AB (ref 12.0–15.0)
Lymphocytes Relative: 16 % (ref 12–46)
Lymphs Abs: 1.7 10*3/uL (ref 0.7–4.0)
MCH: 29.2 pg (ref 26.0–34.0)
MCHC: 32.6 g/dL (ref 30.0–36.0)
MCV: 89.5 fL (ref 78.0–100.0)
MONO ABS: 1.2 10*3/uL — AB (ref 0.1–1.0)
MONOS PCT: 12 % (ref 3–12)
Neutro Abs: 7.3 10*3/uL (ref 1.7–7.7)
Neutrophils Relative %: 70 % (ref 43–77)
Platelets: 192 10*3/uL (ref 150–400)
RBC: 3.15 MIL/uL — ABNORMAL LOW (ref 3.87–5.11)
RDW: 17 % — AB (ref 11.5–15.5)
WBC: 10.5 10*3/uL (ref 4.0–10.5)

## 2014-12-30 LAB — COMPREHENSIVE METABOLIC PANEL
ALT: 35 U/L (ref 14–54)
AST: 42 U/L — AB (ref 15–41)
Albumin: 3.4 g/dL — ABNORMAL LOW (ref 3.5–5.0)
Alkaline Phosphatase: 126 U/L (ref 38–126)
Anion gap: 11 (ref 5–15)
BILIRUBIN TOTAL: 0.5 mg/dL (ref 0.3–1.2)
BUN: 30 mg/dL — ABNORMAL HIGH (ref 6–20)
CHLORIDE: 98 mmol/L — AB (ref 101–111)
CO2: 29 mmol/L (ref 22–32)
CREATININE: 5.51 mg/dL — AB (ref 0.44–1.00)
Calcium: 8.3 mg/dL — ABNORMAL LOW (ref 8.9–10.3)
GFR, EST AFRICAN AMERICAN: 8 mL/min — AB (ref >60)
GFR, EST NON AFRICAN AMERICAN: 7 mL/min — AB (ref >60)
GLUCOSE: 102 mg/dL — AB (ref 65–99)
Potassium: 3.4 mmol/L — ABNORMAL LOW (ref 3.5–5.1)
Sodium: 138 mmol/L (ref 135–145)
Total Protein: 7.1 g/dL (ref 6.5–8.1)

## 2014-12-30 LAB — URINE MICROSCOPIC-ADD ON

## 2014-12-30 LAB — TROPONIN I: TROPONIN I: 0.03 ng/mL (ref <0.031)

## 2014-12-30 MED ORDER — ALTEPLASE 2 MG IJ SOLR
2.0000 mg | Freq: Once | INTRAMUSCULAR | Status: AC | PRN
  Filled 2014-12-30: qty 2

## 2014-12-30 MED ORDER — SEVELAMER CARBONATE 800 MG PO TABS
2400.0000 mg | ORAL_TABLET | Freq: Three times a day (TID) | ORAL | Status: DC
  Administered 2014-12-31 – 2015-01-01 (×4): 2400 mg via ORAL
  Filled 2014-12-30 (×7): qty 3

## 2014-12-30 MED ORDER — ASPIRIN EC 81 MG PO TBEC
81.0000 mg | DELAYED_RELEASE_TABLET | Freq: Every morning | ORAL | Status: DC
  Administered 2014-12-31 – 2015-01-01 (×2): 81 mg via ORAL
  Filled 2014-12-30 (×3): qty 1

## 2014-12-30 MED ORDER — SENNOSIDES 8.6 MG PO TABS: 2.0000 | ORAL_TABLET | Freq: Every day | ORAL | Status: DC | PRN

## 2014-12-30 MED ORDER — PREDNISONE 20 MG PO TABS: 30.0000 mg | ORAL_TABLET | Freq: Every day | ORAL | Status: DC

## 2014-12-30 MED ORDER — NEPRO/CARBSTEADY PO LIQD
237.0000 mL | ORAL | Status: DC | PRN
  Filled 2014-12-30: qty 237

## 2014-12-30 MED ORDER — CARBAMAZEPINE 200 MG PO TABS
200.0000 mg | ORAL_TABLET | Freq: Two times a day (BID) | ORAL | Status: DC
  Administered 2014-12-30 – 2015-01-01 (×4): 200 mg via ORAL
  Filled 2014-12-30 (×5): qty 1

## 2014-12-30 MED ORDER — AMLODIPINE BESYLATE 10 MG PO TABS
10.0000 mg | ORAL_TABLET | Freq: Every day | ORAL | Status: DC
  Administered 2014-12-30: 10 mg via ORAL
  Filled 2014-12-30: qty 1
  Filled 2014-12-30: qty 2

## 2014-12-30 MED ORDER — SODIUM CHLORIDE 0.9 % IJ SOLN
3.0000 mL | Freq: Two times a day (BID) | INTRAMUSCULAR | Status: DC
  Administered 2014-12-30: 3 mL via INTRAVENOUS

## 2014-12-30 MED ORDER — PREGABALIN 50 MG PO CAPS: 75.0000 mg | ORAL_CAPSULE | Freq: Every evening | ORAL | Status: DC

## 2014-12-30 MED ORDER — CINACALCET HCL 30 MG PO TABS
30.0000 mg | ORAL_TABLET | Freq: Every day | ORAL | Status: DC
  Filled 2014-12-30 (×2): qty 1

## 2014-12-30 MED ORDER — PENTAFLUOROPROP-TETRAFLUOROETH EX AERO: 1.0000 "application " | INHALATION_SPRAY | CUTANEOUS | Status: DC | PRN

## 2014-12-30 MED ORDER — ATORVASTATIN CALCIUM 40 MG PO TABS
40.0000 mg | ORAL_TABLET | Freq: Every day | ORAL | Status: DC
  Administered 2014-12-30: 40 mg via ORAL
  Filled 2014-12-30 (×3): qty 1

## 2014-12-30 MED ORDER — ARIPIPRAZOLE 2 MG PO TABS
1.0000 mg | ORAL_TABLET | Freq: Every day | ORAL | Status: DC
  Administered 2014-12-31 – 2015-01-01 (×2): 1 mg via ORAL
  Filled 2014-12-30 (×2): qty 1

## 2014-12-30 MED ORDER — HEPARIN SODIUM (PORCINE) 1000 UNIT/ML DIALYSIS: 1000.0000 [IU] | INTRAMUSCULAR | Status: DC | PRN

## 2014-12-30 MED ORDER — LIDOCAINE HCL (PF) 1 % IJ SOLN: 5.0000 mL | INTRAMUSCULAR | Status: DC | PRN

## 2014-12-30 MED ORDER — CLONIDINE HCL 0.1 MG PO TABS
0.1000 mg | ORAL_TABLET | Freq: Two times a day (BID) | ORAL | Status: DC
  Administered 2014-12-30: 0.1 mg via ORAL
  Filled 2014-12-30: qty 1
  Filled 2014-12-30: qty 2

## 2014-12-30 MED ORDER — LORATADINE 10 MG PO TABS
10.0000 mg | ORAL_TABLET | Freq: Every day | ORAL | Status: DC | PRN
  Filled 2014-12-30: qty 1

## 2014-12-30 MED ORDER — FENTANYL CITRATE (PF) 100 MCG/2ML IJ SOLN
50.0000 ug | Freq: Once | INTRAMUSCULAR | Status: AC
Start: 2014-12-30 — End: 2014-12-30
  Administered 2014-12-30: 50 ug via INTRAVENOUS
  Filled 2014-12-30: qty 2

## 2014-12-30 MED ORDER — SODIUM CHLORIDE 0.9 % IV SOLN
100.0000 mL | INTRAVENOUS | Status: DC | PRN
Start: 2014-12-30 — End: 2014-12-31

## 2014-12-30 MED ORDER — PREDNISONE 20 MG PO TABS
40.0000 mg | ORAL_TABLET | Freq: Every day | ORAL | Status: DC
  Administered 2014-12-30: 40 mg via ORAL
  Filled 2014-12-30 (×2): qty 2

## 2014-12-30 MED ORDER — SODIUM CHLORIDE 0.9 % IV SOLN: 100.0000 mL | INTRAVENOUS | Status: DC | PRN

## 2014-12-30 MED ORDER — DARBEPOETIN ALFA 25 MCG/0.42ML IJ SOSY
PREFILLED_SYRINGE | INTRAMUSCULAR | Status: AC
  Filled 2014-12-30: qty 0.42

## 2014-12-30 MED ORDER — PREGABALIN 75 MG PO CAPS
75.0000 mg | ORAL_CAPSULE | Freq: Every evening | ORAL | Status: DC
  Administered 2014-12-30: 75 mg via ORAL
  Filled 2014-12-30 (×2): qty 1

## 2014-12-30 MED ORDER — ACETAMINOPHEN 650 MG RE SUPP: 650.0000 mg | Freq: Four times a day (QID) | RECTAL | Status: DC | PRN

## 2014-12-30 MED ORDER — PREDNISONE 20 MG PO TABS: 20.0000 mg | ORAL_TABLET | Freq: Every day | ORAL | Status: DC

## 2014-12-30 MED ORDER — METOPROLOL SUCCINATE ER 100 MG PO TB24
100.0000 mg | ORAL_TABLET | Freq: Every day | ORAL | Status: DC
  Administered 2014-12-30 – 2014-12-31 (×2): 100 mg via ORAL
  Filled 2014-12-30 (×3): qty 1

## 2014-12-30 MED ORDER — SENNA 8.6 MG PO TABS
2.0000 | ORAL_TABLET | Freq: Every day | ORAL | Status: DC | PRN
  Administered 2014-12-31 – 2015-01-01 (×2): 17.2 mg via ORAL
  Filled 2014-12-30 (×3): qty 2

## 2014-12-30 MED ORDER — DARBEPOETIN ALFA 25 MCG/0.42ML IJ SOSY: 25.0000 ug | PREFILLED_SYRINGE | INTRAMUSCULAR | Status: DC

## 2014-12-30 MED ORDER — ACETAMINOPHEN 325 MG PO TABS
650.0000 mg | ORAL_TABLET | Freq: Four times a day (QID) | ORAL | Status: DC | PRN
  Administered 2015-01-01: 650 mg via ORAL
  Filled 2014-12-30: qty 2

## 2014-12-30 MED ORDER — LOSARTAN POTASSIUM 50 MG PO TABS
100.0000 mg | ORAL_TABLET | Freq: Every day | ORAL | Status: DC
  Filled 2014-12-30: qty 2

## 2014-12-30 MED ORDER — SODIUM CHLORIDE 0.9 % IJ SOLN
3.0000 mL | Freq: Two times a day (BID) | INTRAMUSCULAR | Status: DC
  Administered 2014-12-31 – 2015-01-01 (×2): 3 mL via INTRAVENOUS

## 2014-12-30 MED ORDER — ONDANSETRON HCL 4 MG/2ML IJ SOLN
4.0000 mg | Freq: Once | INTRAMUSCULAR | Status: AC
  Administered 2014-12-30: 4 mg via INTRAVENOUS
  Filled 2014-12-30: qty 2

## 2014-12-30 MED ORDER — CLONIDINE HCL 0.2 MG PO TABS
0.2000 mg | ORAL_TABLET | Freq: Every day | ORAL | Status: DC
  Administered 2014-12-30 – 2014-12-31 (×2): 0.2 mg via ORAL
  Filled 2014-12-30 (×2): qty 1
  Filled 2014-12-30: qty 2

## 2014-12-30 MED ORDER — LIDOCAINE-PRILOCAINE 2.5-2.5 % EX CREA
1.0000 "application " | TOPICAL_CREAM | CUTANEOUS | Status: DC | PRN
  Filled 2014-12-30: qty 5

## 2014-12-30 MED ORDER — DOXERCALCIFEROL 4 MCG/2ML IV SOLN
3.0000 ug | INTRAVENOUS | Status: DC
  Administered 2015-01-01: 3 ug via INTRAVENOUS
  Filled 2014-12-30: qty 2

## 2014-12-30 MED ORDER — PREDNISONE 10 MG PO TABS: 10.0000 mg | ORAL_TABLET | Freq: Every day | ORAL | Status: DC

## 2014-12-30 MED ORDER — SODIUM CHLORIDE 0.9 % IJ SOLN: 3.0000 mL | INTRAMUSCULAR | Status: DC | PRN

## 2014-12-30 MED ORDER — DARBEPOETIN ALFA 25 MCG/0.42ML IJ SOSY
25.0000 ug | PREFILLED_SYRINGE | Freq: Once | INTRAMUSCULAR | Status: AC
  Administered 2014-12-30: 25 ug via INTRAVENOUS
  Filled 2014-12-30: qty 0.42

## 2014-12-30 MED ORDER — HEPARIN SODIUM (PORCINE) 1000 UNIT/ML DIALYSIS: 20.0000 [IU]/kg | INTRAMUSCULAR | Status: DC | PRN

## 2014-12-30 MED ORDER — CLONIDINE HCL 0.1 MG PO TABS
0.1000 mg | ORAL_TABLET | Freq: Every day | ORAL | Status: DC
  Administered 2014-12-31 – 2015-01-01 (×2): 0.1 mg via ORAL
  Filled 2014-12-30 (×2): qty 1

## 2014-12-30 MED ORDER — HEPARIN SODIUM (PORCINE) 5000 UNIT/ML IJ SOLN
5000.0000 [IU] | Freq: Three times a day (TID) | INTRAMUSCULAR | Status: DC
  Administered 2014-12-30 – 2015-01-01 (×5): 5000 [IU] via SUBCUTANEOUS
  Filled 2014-12-30 (×6): qty 1

## 2014-12-30 MED ORDER — SODIUM CHLORIDE 0.9 % IV SOLN: 250.0000 mL | INTRAVENOUS | Status: DC | PRN

## 2014-12-30 MED ORDER — PANTOPRAZOLE SODIUM 40 MG PO TBEC
40.0000 mg | DELAYED_RELEASE_TABLET | Freq: Every day | ORAL | Status: DC
  Administered 2014-12-31 – 2015-01-01 (×2): 40 mg via ORAL
  Filled 2014-12-30 (×2): qty 1

## 2014-12-30 NOTE — ED Notes (Signed)
MD at bedside. 

## 2014-12-30 NOTE — ED Notes (Signed)
Pt diaphoretic, MD made aware of O2 sats and diaphoresis, no further action required at this time.  Pt reports pain slightly better but falls asleep while RN still in room.  Daughter states pt desats with narcotics when she receives IV but typically only goes down to ~85%.

## 2014-12-30 NOTE — ED Provider Notes (Signed)
CSN: [id number]     Arrival date & time 12/30/14  1007 History   First MD Initiated Contact with Patient 12/30/14 1015     Chief Complaint  Patient presents with  . Abdominal Pain      HPI Patient presents with about a 10 hour history of increasing left flank and left lower quadrant abdominal pain.  She's had 2 episodes of vomiting.  Denies fever.  She has been treated recently for a right lower back and right leg pain.  She's been on Percocet for that.  Patient has significant medical history of renal failure and is on chronic dialysis.  She was dialyzed yesterday.  She states that they dialyzed her less than a normally do.  She also has a significant history of diverticulitis. Past Medical History  Diagnosis Date  . Hyperlipidemia   . Normal cardiac stress test 12/24/2009    lexiscan, imaging normal  . 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 Diverticulitis   . IBS (irritable bowel syndrome)   . Thyroid disease   . Depression   . Chronic kidney disease     Hemo MWF  . GERD (gastroesophageal reflux disease)   . H/O hiatal hernia   . Arthritis   . RLS (restless legs syndrome)   . Tubular adenoma of colon 01/2008  . Seizures     2004  . Constipation   . Sinus complaint   . Dialysis patient     kidney  . Renal disorder   . Adrenal mass   . Hypertension   . High cholesterol   . Back pain   . Meralgia paresthetica of right side 12/26/2014   Past Surgical History  Procedure Laterality Date  . Cardiac catheterization  2003    normal  . Cholecystectomy      Open mid line incision  . Frontal craniotomy  2002    indication = sinusitis  . Appendectomy    . Tubal ligation    . Abdominal hysterectomy    . Insertion of dialysis catheter      Left  . Revison of arteriovenous fistula  05/11/2012    Procedure: REVISON OF ARTERIOVENOUS FISTULA;  Surgeon: Elam Dutch, MD;  Location: [place];  Service: Vascular;  Laterality: Right;  . Av  fistula placement Left 11/11/2012    Procedure: INSERTION OF ARTERIOVENOUS (AV) GORE-TEX GRAFT ARM;  Surgeon: Angelia Mould, MD;  Location: [place];  Service: Vascular;  Laterality: Left;  . Avgg removal Left 11/18/2012    Procedure: REMOVAL OF LEFT UPPER ARM ARTERIOVENOUS GORETEX GRAFT ([place]);  Surgeon: Angelia Mould, MD;  Location: [place];  Service: Vascular;  Laterality: Left;  . Patch angioplasty Left 11/18/2012    Procedure: PATCH ANGIOPLASTY;  Surgeon: Angelia Mould, MD;  Location: [place];  Service: Vascular;  Laterality: Left;  . Frontal cranialcity  2002   Family History  Problem Relation Age of Onset  . Thyroid cancer Mother   . Heart disease Father   . Hypertension Father   . Heart attack Father   . Breast cancer Sister   . Lupus Daughter    History  Substance Use Topics  . Smoking status: Current Every Day Smoker -- 0.10 packs/day for 50 years    Types: Cigarettes    Start date: 06/16/2014  . Smokeless tobacco: Never Used     Comment: pt states she smokes "1" cig per day  .  Alcohol Use: No   OB History    Gravida Para Term Preterm AB TAB SAB Ectopic Multiple Living   5 3 3             Review of Systems  Constitutional: Negative for fever.  Gastrointestinal: Positive for vomiting and abdominal pain.  Genitourinary: Positive for flank pain.      Allergies  Codeine; Codeine; Tuberculin tests; Penicillins; and Sulfamethoxazole  Home Medications   Prior to Admission medications   Medication Sig Start Date End Date Taking? Authorizing Provider  ABILIFY 2 MG tablet Take 0.5 tablets (1 mg total) by mouth daily. 11/23/14   Coral Spikes, DO  allopurinol (ZYLOPRIM) 100 MG tablet TAKE ONE TABLET BY MOUTH ONCE DAILY ON  THE  DAYS  FOLLOWING  DIALYSIS  (TUESDAY,  THURSDAY  AND  SATURDAYS) 06/27/14   Tamela Oddi Hess, DO  amLODipine (NORVASC) 10 MG tablet TAKE ONE TABLET BY MOUTH ONCE DAILY 09/25/14   Coral Spikes, DO  aspirin EC 81 MG tablet Take 81 mg by mouth  every morning.    Historical Provider, MD  atorvastatin (LIPITOR) 40 MG tablet TAKE ONE TABLET BY MOUTH ONCE DAILY 11/13/14   Coral Spikes, DO  carbamazepine (TEGRETOL) 200 MG tablet Take 1 tablet (200 mg total) by mouth 2 (two) times daily. 12/26/14   Kathrynn Ducking, MD  cinacalcet (SENSIPAR) 30 MG tablet Take 30 mg by mouth at bedtime.    Historical Provider, MD  cloNIDine (CATAPRES) 0.1 MG tablet Take 0.1-0.2 mg by mouth 2 (two) times daily. Take 1 tablet in the morning and 2 tablets in the evening    Historical Provider, MD  cyclobenzaprine (FLEXERIL) 5 MG tablet Take 1 tablet (5 mg total) by mouth 3 (three) times daily as needed for muscle spasms. 12/12/14   Rosemarie Ax, MD  diclofenac sodium (VOLTAREN) 1 % GEL Apply 4 g topically 4 (four) times daily. 12/23/14   April Palumbo, MD  fluticasone [facility]) 50 MCG/ACT nasal spray Place 2 sprays into both nostrils daily as needed for allergies or rhinitis.     Historical Provider, MD  folic acid-vitamin b complex-vitamin c-selenium-zinc (DIALYVITE) 3 MG TABS tablet Take 1 tablet by mouth daily.    Historical Provider, MD  HYDROcodone-acetaminophen (NORCO/VICODIN) 5-325 MG per tablet Take 1 tablet by mouth every 6 (six) hours as needed for moderate pain. 09/27/14   Coral Spikes, DO  lidocaine-prilocaine (EMLA) cream Apply 1 application topically 3 (three) times a week. Use before dialysis on MWF    Historical Provider, MD  loratadine (CLARITIN) 10 MG tablet Take 1 tablet (10 mg total) by mouth daily as needed for allergies. 09/27/14   Coral Spikes, DO  losartan (COZAAR) 100 MG tablet Take 1 tablet (100 mg total) by mouth daily. 09/27/14   Coral Spikes, DO  metoprolol succinate (TOPROL-XL) 100 MG 24 hr tablet Take 100 mg by mouth at bedtime. Take with or immediately following a meal.    Historical Provider, MD  omeprazole (PRILOSEC) 20 MG capsule Take 20 mg by mouth 2 (two) times daily.    Historical Provider, MD  oxyCODONE-acetaminophen (PERCOCET)  5-325 MG per tablet Take 1 tablet by mouth every 4 (four) hours as needed for moderate pain. 6/71/24   Delora Fuel, MD  predniSONE (DELTASONE) 10 MG tablet Begin taking 6 tablets daily, taper by one tablet every other day until off the medication. 12/26/14   Kathrynn Ducking, MD  pregabalin (LYRICA) 75 MG  capsule Take 75 mg by mouth every evening.     Historical Provider, MD  senna (SENOKOT) 8.6 MG tablet Take 2 tablets by mouth daily as needed. For constipation    Historical Provider, MD  sevelamer carbonate (RENVELA) 800 MG tablet Take 2,400 mg by mouth 3 (three) times daily with meals as needed (whenever food is consumed).     Historical Provider, MD  Sulfuric Acid-Sulf Phenolics 98-11 % SOLN Use as indicated once for your mouth lesion. 09/27/14   Jayce G Cook, DO   BP 210/75 mmHg  Pulse 89  Temp(Src) 98.8 F (37.1 C) (Oral)  Resp 18  Wt 181 lb (82.101 kg)  SpO2 98% Physical Exam  Constitutional: She is oriented to person, place, and time. She appears well-developed and well-nourished. No distress.  HENT:  Head: Normocephalic and atraumatic.  Eyes: Pupils are equal, round, and reactive to light.  Neck: Normal range of motion.  Cardiovascular: Normal rate and intact distal pulses.   Pulmonary/Chest: No respiratory distress. She has rales.  Abdominal: Normal appearance. She exhibits no distension. There is tenderness in the left upper quadrant. There is no rebound.    Musculoskeletal: Normal range of motion.  Neurological: She is alert and oriented to person, place, and time. No cranial nerve deficit.  Skin: Skin is warm and dry. No rash noted.  Psychiatric: She has a normal mood and affect. Her behavior is normal.  Nursing note and vitals reviewed.   ED Course  Procedures (including critical care time)  Medications  cloNIDine (CATAPRES) tablet 0.1-0.2 mg (0.1 mg Oral Given 12/30/14 1313)  amLODipine (NORVASC) tablet 10 mg (10 mg Oral Given 12/30/14 1313)  fentaNYL (SUBLIMAZE)  injection 50 mcg (50 mcg Intravenous Given 12/30/14 1058)  ondansetron (ZOFRAN) injection 4 mg (4 mg Intravenous Given 12/30/14 1058)    CRITICAL CARE Performed by: Leonard Schwartz L Total critical care time: 45 min Critical care time was exclusive of separately billable procedures and treating other patients. Critical care was necessary to treat or prevent imminent or life-threatening deterioration. Critical care was time spent personally by me on the following activities: development of treatment plan with patient and/or surrogate as well as nursing, discussions with consultants, evaluation of patient's response to treatment, examination of patient, obtaining history from patient or surrogate, ordering and performing treatments and interventions, ordering and review of laboratory studies, ordering and review of radiographic studies, pulse oximetry and re-evaluation of patient's condition.   After the patient returned from CT she began complaining of shortness of breath and her oxygen level again dropping.  She required BiPAP which improved proximal level to the mid 90s.  I spoke with Dr. Burnett Sheng from renal who looked at her films and felt that she was in pulmonary edema and needed to be dialyzed today.  Plan at this time is to transfer her to the emergency room at [facility] and she can go to dialysis.  Spoke with Dr. Gerline Legacy at the ER at [facility] along with the family practice residents and updated them on my decision to transfer ER to ER bed status was changed from a MedSurg to a stepdown bed. Labs Review Labs Reviewed  CBC WITH DIFFERENTIAL/PLATELET - Abnormal; Notable for the following:    RBC 3.15 (*)    Hemoglobin 9.2 (*)    HCT 28.2 (*)    RDW 17.0 (*)    Monocytes Absolute 1.2 (*)    All other components within normal limits  COMPREHENSIVE METABOLIC PANEL - Abnormal; Notable for the  following:    Potassium 3.4 (*)    Chloride 98 (*)    Glucose, Bld 102 (*)    BUN 30 (*)    Creatinine,  Ser 5.51 (*)    Calcium 8.3 (*)    Albumin 3.4 (*)    AST 42 (*)    GFR calc non Af Amer 7 (*)    GFR calc Af Amer 8 (*)    All other components within normal limits  URINALYSIS, ROUTINE W REFLEX MICROSCOPIC (NOT AT [facility])    Imaging Review Ct Renal Stone Study  12/30/2014   CLINICAL DATA:  74 year old female with left flank, abdominal and pelvic pain for 1 day. Nausea and vomiting. Initial encounter.  EXAM: CT ABDOMEN AND PELVIS WITHOUT CONTRAST  TECHNIQUE: Multidetector CT imaging of the abdomen and pelvis was performed following the standard protocol without IV contrast.  COMPARISON:  11/15/2008 CT  FINDINGS: Please note that parenchymal abnormalities may be missed without intravenous contrast.  Lower chest: A very small right pleural effusion is noted. Mild interstitial prominence in the lung bases are noted and may represent mild interstitial edema.  Hepatobiliary: The liver is unremarkable. The patient is status post cholecystectomy. There is no evidence of biliary dilatation.  Pancreas: Unremarkable  Spleen: Unremarkable  Adrenals/Urinary Tract: Moderate -severe bilateral renal atrophy is noted. A new 2.5 cm mass within the mid left kidney is identified. Bilateral renal cysts are again noted. There is no evidence of hydronephrosis or obstructing urinary calculi. A stable 1.3 x 2 cm left adrenal nodule is noted, almost certainly representing an adenoma. The bladder is unremarkable.  Stomach/Bowel: Descending and sigmoid colonic diverticulosis noted without diverticulitis. There is no evidence of bowel obstruction or definite bowel wall thickening.  Vascular/Lymphatic: There is no evidence of enlarged lymph nodes or abdominal aortic aneurysm. Abdominal aortic atherosclerotic calcifications again noted.  Reproductive: The patient is status post hysterectomy. There is no evidence of adnexal mass.  Other: No free fluid, focal collection or pneumoperitoneum.  Musculoskeletal: No acute or suspicious  abnormalities. Mild multilevel degenerative disc disease and moderate facet arthropathy in the lower lumbar spine identified.  IMPRESSION: No evidence of acute abnormality within the abdomen or pelvis is.  New 2.5 cm left renal mass. Initial evaluation with ultrasound may or may not be helpful. Otherwise CT with and without contrast is recommended to coincide with dialysis.  New small right pleural effusion and bibasilar interstitial prominence which may represent pulmonary edema.  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  Colonic diverticulosis without evidence of diverticulitis.   Electronically Signed   By: Margarette Canada M.D.   On: 12/30/2014 11:30      MDM   Final diagnoses:  Pain  Renal mass        Leonard Schwartz, MD 12/30/14 1433

## 2014-12-30 NOTE — ED Notes (Signed)
Patient transported to CT via stretcher per tech. 

## 2014-12-30 NOTE — H&P (Signed)
[facility] Admission History and Physical Service Pager: [phone]  Patient name: [name] Medical record number: [id number] Date of birth: [birth year] Age: 74 y.o. Gender: female  Primary Care Provider: Junie Panning, DO Consultants: Nephrology Code Status: Full   Chief Complaint: L flank pain and RLQ pain  Assessment and Plan: [name] is a 74 y.o. female presenting with L flank pain and LLQ with hypoxia.  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   Respiratory failure: Most likely secondary to fluid overload, however odd as she completed a full session fo HD yesterday and denies any exacerbating factors. CXR without evidence of infection, afebrile, without a leukocytosis. No recent immobilization, surgeries, tachycardia, hemoptysis, evidence of DVT on exam, or h/o malignancy concerning for PE, however will keep this on the DDx. The patient was noted to have palpitations in the ED earlier this year. No palpitations however it was recommended to f/u with cards a possible event monitor. No EKG or troponins done at the outside ED. Given acute onset with no evident change in HD schedule, would like to r/o cardiac etiology. Currently on BiPAP, breathing comfortably and satting 100% - Admit to SDU, attending Dr. 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  Has been on dialysis since 10/13. Continues to make urine. No hematuria or dysuria.  - Renal following, appreciate  recs - Continue home senispar and renvela  -continue Hectorol next week for metabolic bone disease per renal - Hypokalemic prior to HD, will f/u on AM.  HTN: patient's BPs have been very poorly controlled. No headaches or vision changes currently. No evidence of end organ damage. Hopefully will improved after HD. Additionally, pt has not taken medications today therefore likely some rebound due to not taking clonidine. There was a stable 1.3 x 2 cm left adrenal nodule from 2010, compatible with an adenoma however I do not think this is contributing as BPs were better in the past (SBPs 180s) - f/u after HD - resume home clonidine, metoprolol XL, amlodpine, and cozaar - continue to monitor  - consider PRN hydralazine if BPs have not improved.  LLQ pain/flank pain: Patient w/ a h/o diverticulosis however no evidence of diverticulitis on CT. No obstruction noted and normal BS on exam.  No kidney stones noted. U/A without LE or nitrites and no dysuria or fevers concerning for pyelonephritis/cystitis. Hepatic function normal.  H/o IBS which may be contributing. Also concerns for constipation with hard small stools. It was initially thought that the L renal mass could be contributing, however this is less likely per renal. - continue to monitor - once off BiPaP and more ambulatory, consider MiraLax.  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  Was prescribed a prednisone taper and increased Tegretol.   - continue steroid  taper: prednisone 40mg  x 2d, 30mg  x2d, 20mg  x2d, then 10mg  x1d - continue home Lyrica and Tegretol.   Depression: stable  - continue Abilify   HLD:  - continue lipitor  FEN/GI: NPO for now/ IV saline lock/ Protonix Prophylaxis: SQ hep  Disposition: place in SDU for close monitoring  History of Present  Illness: [name] is a 74 y.o. female presenting from [facility] ED with LLQ abdominal pain and L flank pain for the past 12 hours. The pain came on suddenly. She had 2 episodes of emesis (NBNP). Denies diarrhea, melena, or hematochezia. Notes she had small round stools that were hard last night.  Patient took 2 percocets with no pain relief. She has been taking Percocet for R lower back and leg pain. Pain on LLQ gets worse when she lays on it, no alleviating factors. The pain is described as a "dull ache" and 9/10. Also admits to generalized chest pain with shortness of breath x 1 week and described as "feels like my chest is filling up with fluid". Admits to decreased appetite for the last 2-3 weeks and not drinking water.  At [facility] ED, the patient had a stone search CT that revealed a possible pulmonary edema and a new 2.5 cm L renal mass.Marland Kitchen She was then noted to have worsening shortness of breath and decreased O2 saturation (per the pt this came on suddenly). She was placed on BiPap and O2 returned to mid 90s. CXR was consistent with pulmonary edema. She was transferred urgently for dialysis after the EDP spoke with nephrology.   Review Of Systems: Per HPI with the following additions: Denies fever, headache, dysuria, anxiety, sore throat, edema, or dysuria.  Otherwise 12 point review of systems was performed and was unremarkable.  Past Medical History: Past Medical History  Diagnosis Date  . Hyperlipidemia   . Normal cardiac stress test 12/24/2009    lexiscan, imaging normal  . 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 Diverticulitis   . IBS (irritable bowel syndrome)   . Thyroid disease   . Depression   . Chronic kidney disease     Hemo MWF  . GERD (gastroesophageal reflux disease)   . H/O hiatal hernia   . Arthritis   . RLS (restless legs syndrome)   . Tubular adenoma of colon 01/2008  . Seizures     2004  . Constipation   . Sinus  complaint   . Dialysis patient     kidney  . Renal disorder   . Adrenal mass   . Hypertension   . High cholesterol   . Back pain   . Meralgia paresthetica of right side 12/26/2014   Past Surgical History: Past Surgical History  Procedure Laterality Date  . Cardiac catheterization  2003    normal  . Cholecystectomy      Open mid line incision  . Frontal craniotomy  2002    indication = sinusitis  . Appendectomy    . Tubal ligation    . Abdominal hysterectomy    . Insertion of dialysis catheter      Left  . Revison of arteriovenous fistula  05/11/2012    Procedure: REVISON OF ARTERIOVENOUS FISTULA;  Surgeon: Elam Dutch, MD;  Location: [place];  Service: Vascular;  Laterality: Right;  . Av fistula placement Left 11/11/2012    Procedure: INSERTION OF ARTERIOVENOUS (AV) GORE-TEX GRAFT ARM;  Surgeon: Angelia Mould,  MD;  Location: [place];  Service: Vascular;  Laterality: Left;  . Avgg removal Left 11/18/2012    Procedure: REMOVAL OF LEFT UPPER ARM ARTERIOVENOUS GORETEX GRAFT ([place]);  Surgeon: Angelia Mould, MD;  Location: [place];  Service: Vascular;  Laterality: Left;  . Patch angioplasty Left 11/18/2012    Procedure: PATCH ANGIOPLASTY;  Surgeon: Angelia Mould, MD;  Location: [place];  Service: Vascular;  Laterality: Left;  . Frontal cranialcity  2002   Social History: History  Substance Use Topics  . Smoking status: Current Every Day Smoker -- 0.10 packs/day for 50 years    Types: Cigarettes    Start date: 06/16/2014  . Smokeless tobacco: Never Used     Comment: pt states she smokes "1" cig per day  . Alcohol Use: No   Additional social history: smokes 1 cig per day, no alcohol or drug use.   Please also refer to relevant sections of EMR.  Family History: Family History  Problem Relation Age of Onset  . Thyroid cancer Mother   . Heart disease Father   . Hypertension Father   . Heart attack Father   . Breast cancer Sister   . Lupus Daughter     Allergies and Medications: Allergies  Allergen Reactions  . Codeine Nausea And Vomiting  . Codeine Other (See Comments)    unknown  . Tuberculin Tests Hives    "blisters"  . Penicillins Rash    No problems breathing. Has tolerated omnicef in past without issue  . Sulfamethoxazole Rash   No current facility-administered medications on file prior to encounter.   Current Outpatient Prescriptions on File Prior to Encounter  Medication Sig Dispense Refill  . ABILIFY 2 MG tablet Take 0.5 tablets (1 mg total) by mouth daily. 90 tablet 3  . allopurinol (ZYLOPRIM) 100 MG tablet TAKE ONE TABLET BY MOUTH ONCE DAILY ON  THE  DAYS  FOLLOWING  DIALYSIS  (TUESDAY,  THURSDAY  AND  SATURDAYS) 30 tablet 11  . amLODipine (NORVASC) 10 MG tablet TAKE ONE TABLET BY MOUTH ONCE DAILY 90 tablet 0  . aspirin EC 81 MG tablet Take 81 mg by mouth every morning.    Marland Kitchen atorvastatin (LIPITOR) 40 MG tablet TAKE ONE TABLET BY MOUTH ONCE DAILY 90 tablet 0  . carbamazepine (TEGRETOL) 200 MG tablet Take 1 tablet (200 mg total) by mouth 2 (two) times daily. 60 tablet 3  . cinacalcet (SENSIPAR) 30 MG tablet Take 30 mg by mouth at bedtime.    . cloNIDine (CATAPRES) 0.1 MG tablet Take 0.1-0.2 mg by mouth 2 (two) times daily. Take 1 tablet in the morning and 2 tablets in the evening    . cyclobenzaprine (FLEXERIL) 5 MG tablet Take 1 tablet (5 mg total) by mouth 3 (three) times daily as needed for muscle spasms. 30 tablet 0  . diclofenac sodium (VOLTAREN) 1 % GEL Apply 4 g topically 4 (four) times daily. 100 g 0  . fluticasone (FLONASE) 50 MCG/ACT nasal spray Place 2 sprays into both nostrils daily as needed for allergies or rhinitis.     . folic acid-vitamin b complex-vitamin c-selenium-zinc (DIALYVITE) 3 MG TABS tablet Take 1 tablet by mouth daily.    Marland Kitchen HYDROcodone-acetaminophen (NORCO/VICODIN) 5-325 MG per tablet Take 1 tablet by mouth every 6 (six) hours as needed for moderate pain. 30 tablet 0  . lidocaine-prilocaine  (EMLA) cream Apply 1 application topically 3 (three) times a week. Use before dialysis on MWF    . loratadine (  CLARITIN) 10 MG tablet Take 1 tablet (10 mg total) by mouth daily as needed for allergies. 90 tablet 3  . losartan (COZAAR) 100 MG tablet Take 1 tablet (100 mg total) by mouth daily. 90 tablet 3  . metoprolol succinate (TOPROL-XL) 100 MG 24 hr tablet Take 100 mg by mouth at bedtime. Take with or immediately following a meal.    . omeprazole (PRILOSEC) 20 MG capsule Take 20 mg by mouth 2 (two) times daily.    Marland Kitchen oxyCODONE-acetaminophen (PERCOCET) 5-325 MG per tablet Take 1 tablet by mouth every 4 (four) hours as needed for moderate pain. 10 tablet 0  . predniSONE (DELTASONE) 10 MG tablet Begin taking 6 tablets daily, taper by one tablet every other day until off the medication. 42 tablet 0  . pregabalin (LYRICA) 75 MG capsule Take 75 mg by mouth every evening.     . senna (SENOKOT) 8.6 MG tablet Take 2 tablets by mouth daily as needed. For constipation    . sevelamer carbonate (RENVELA) 800 MG tablet Take 2,400 mg by mouth 3 (three) times daily with meals as needed (whenever food is consumed).     . Sulfuric Acid-Sulf Phenolics 84-69 % SOLN Use as indicated once for your mouth lesion. 1 each 0    Objective: BP 255/65 mmHg  Pulse 83  Temp(Src) 97.6 F (36.4 C) (Axillary)  Resp 19  Ht 5\' 1"  (1.549 m)  Wt 175 lb 14.8 oz (79.8 kg)  BMI 33.26 kg/m2  SpO2 100% Exam: General: Lying in bed in with BiPaP in place in NAD, non-toxic appearing. Eyes: Conjunctivae non-injected.  ENTM: Unable to assess due to BiPAP Neck: Supple, no LAD Cardiovascular: RRR. No murmurs, rubs, or gallops noted. No pitting edema noted. Respiratory: No increased WOB. Bilateral crackles noted, no wheezing or rhonchi noted. Abdomen: +BS, soft, non-distended, +CVA tenderness on the left. TTP in the LLQ. No rebound or guarding. MSK: Normal bulk and noted. No gross deformities noted. No size discrepancy of the LEs.  Negative Homan's sign. No TTP of the calves. Skin: No rashes noted  Neuro: A&O x4. No gross neurologic deficits  Psych:  Appropriate mood and affect.    Labs and Imaging: CBC BMET   Recent Labs Lab 12/30/14 1045  WBC 10.5  HGB 9.2*  HCT 28.2*  PLT 192    Recent Labs Lab 12/30/14 1045  NA 138  K 3.4*  CL 98*  CO2 29  BUN 30*  CREATININE 5.51*  GLUCOSE 102*  CALCIUM 8.3*     Dg Chest Portable 1 View  12/30/2014   CLINICAL DATA:  Acute onset of shortness of breath and hypoxia.  EXAM: PORTABLE CHEST - 1 VIEW  COMPARISON:  12/26/2014  FINDINGS: Worsening diffuse interstitial infiltrates with asymmetric airspace disease in the right perihilar region, most likely due to diffuse pulmonary edema. Infection is considered less likely. No evidence of pleural effusion. Heart size remains within normal limits. Left subclavian central venous catheter remains in stable position.  IMPRESSION: Worsening diffuse interstitial infiltrates and asymmetric right perihilar airspace disease, most likely due to acute pulmonary edema with infection considered less likely.   Electronically Signed   By: Earle Gell M.D.   On: 12/30/2014 13:42   Ct Renal Stone Study  12/30/2014   CLINICAL DATA:  74 year old female with left flank, abdominal and pelvic pain for 1 day. Nausea and vomiting. Initial encounter.  EXAM: CT ABDOMEN AND PELVIS WITHOUT CONTRAST  TECHNIQUE: Multidetector CT imaging of the abdomen and pelvis  was performed following the standard protocol without IV contrast.  COMPARISON:  11/15/2008 CT  FINDINGS: Please note that parenchymal abnormalities may be missed without intravenous contrast.  Lower chest: A very small right pleural effusion is noted. Mild interstitial prominence in the lung bases are noted and may represent mild interstitial edema.  Hepatobiliary: The liver is unremarkable. The patient is status post cholecystectomy. There is no evidence of biliary dilatation.  Pancreas: Unremarkable   Spleen: Unremarkable  Adrenals/Urinary Tract: Moderate -severe bilateral renal atrophy is noted. A new 2.5 cm mass within the mid left kidney is identified. Bilateral renal cysts are again noted. There is no evidence of hydronephrosis or obstructing urinary calculi. A stable 1.3 x 2 cm left adrenal nodule is noted, almost certainly representing an adenoma. The bladder is unremarkable.  Stomach/Bowel: Descending and sigmoid colonic diverticulosis noted without diverticulitis. There is no evidence of bowel obstruction or definite bowel wall thickening.  Vascular/Lymphatic: There is no evidence of enlarged lymph nodes or abdominal aortic aneurysm. Abdominal aortic atherosclerotic calcifications again noted.  Reproductive: The patient is status post hysterectomy. There is no evidence of adnexal mass.  Other: No free fluid, focal collection or pneumoperitoneum.  Musculoskeletal: No acute or suspicious abnormalities. Mild multilevel degenerative disc disease and moderate facet arthropathy in the lower lumbar spine identified.  IMPRESSION: No evidence of acute abnormality within the abdomen or pelvis is.  New 2.5 cm left renal mass. Initial evaluation with ultrasound may or may not be helpful. Otherwise CT with and without contrast is recommended to coincide with dialysis.  New small right pleural effusion and bibasilar interstitial prominence which may represent pulmonary edema.  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  Colonic diverticulosis without evidence of diverticulitis.   Electronically Signed   By: Margarette Canada M.D.   On: 12/30/2014 11:30     Archie Patten, MD 12/30/2014, 10:44 PM PGY-2, [place] [facility] pager: [phone], text pages welcome

## 2014-12-30 NOTE — ED Notes (Signed)
Oxygen decreased to 2 liters nasal cannula, pt more awake, has maintained O2 sats on 3 liters.

## 2014-12-30 NOTE — ED Notes (Signed)
O2 sats still remain in the high 80s despite increase back up to 2 liters nasal cannula, again increased to 3 liters nasal cannula.

## 2014-12-30 NOTE — ED Notes (Signed)
Placed on 2 liters nasal cannula.

## 2014-12-30 NOTE — Progress Notes (Signed)
Family medicine teaching service initially contacted for admission to our service at [facility]. Unfortunately, patient acutely worsened with SOB and increased O2 requirement. CXR obtained and revealed worsening diffuse interstitial infiltrates and asymmetric right perihilar airspace disease, most likely due to acute pulmonary edema with infection less likely. She then required BiPAP. EDP at [facility] discussed with nephrology who felt pt needed dialysis acutely. Due to the acuity of the patient and her respiratory status, it was felt the patient would benefit from an ED to ED transfer as opposed to a a direct admission to our service (this was discussed amongst the 2 EDPs).  The FMTS appreciates all the efforts in taking care of our clinic patient. We will gladly remain available for consultation and/or admission after the patient arrives at [facility] ED and is more stable.  Archie Patten, MD [facility] Family Medicine Resident  12/30/2014, 3:18 PM

## 2014-12-30 NOTE — ED Notes (Signed)
Pt O2 sats on 3 liters [place] now 80%, increased to 6 liters  and still only with O2 sats 88%, RR now in low 30s and pt stating "its hard to breathe and I cant catch my breath".  Pt appears anxious, now audible cracking and wheeze noted in upper airway when at bedside.  MD made aware and at bedside.

## 2014-12-30 NOTE — Progress Notes (Signed)
FPTS Interim Progress Note  S: Ms. Tribbey's respiratory status is much improved. She reported that the feeling of fluid building up in her chest she endorsed earlier today has improved. She was on BiPAP during exam, but was oxygenating at 100%.   O: BP 255/65 mmHg  Pulse 83  Temp(Src) 97.6 F (36.4 C) (Axillary)  Resp 19  Ht 5\' 1"  (1.549 m)  Wt 175 lb 14.8 oz (79.8 kg)  BMI 33.26 kg/m2  SpO2 100%   General: lying in bed wearing BiPAP Pulm: bilateral crackles most prominent in lung bases; tachypnea to RR low 30s but O2 sat 100% Abd: CVA tenderness, persistent LLQ tenderness to palpation  A/P:  SOB: Much improved. - Transition to non-rebreather  - Continue to monitor O2 sat   HTN: Systolics still in 709G - Resuming her home Toprol, clonidine, amlodipine, and cozaar  - Continue to monitor   Verner Mould, MD 12/30/2014, 10:46 PM PGY-1, [place] Medicine Service pager [phone]

## 2014-12-30 NOTE — ED Notes (Signed)
Pt reports L lower abd pain x 12 hours.  Mild amount of nausea with one episode of vomiting, no diarrhea.

## 2014-12-30 NOTE — ED Notes (Signed)
Portable cxr to bedside

## 2014-12-30 NOTE — Procedures (Signed)
  I was present at this dialysis session, have reviewed the session itself and made  appropriate changes Rob Kadarious Dikes MD (pgr) [phone]    (c) [phone] 11/18/2014, 10:31 AM    

## 2014-12-30 NOTE — Telephone Encounter (Signed)
Patient called after hours line reporting 9/10 LLQ abdominal pain. Reports history of diverticulosis, but denies fevers or bloody BMs. She is on pain medication and fells constipated. She has history of peripheral neuropathies but reports this feels deep and different.  - Advised her to go to ED for evaluation if she felt the pain was severe enough that she could not wait and been seen in urgent care tomorrow morning.

## 2014-12-30 NOTE — ED Notes (Signed)
Pt placed on cardiac and pulse ox monitoring.

## 2014-12-30 NOTE — Progress Notes (Signed)
Pt titrated to a 2LNC.  pt tolerating well,  No distress noted. RN aware of titration and RT will continue to monitor patient status.

## 2014-12-30 NOTE — Consult Note (Signed)
[facility] Renal Consultation Note    Indication for Consultation:  Management of ESRD/hemodialysis; anemia, hypertension/volume and secondary hyperparathyroidism PCP: [facility]  HPI: [name] is a 74 y.o. 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 She has chest pressure/tightness now. She has not had N, V, D, fever or chills, blood in urine or dark stools. Her carbamezepine was recently increased by Dr. Jannifer Franklin.  Addiionally she has been on flexerill, Abilify, and lyrica.  She was given fentanyl and zofran for pain and dropped her sats. She was put on BiPap and sats are better; she was transferred to [facility] for emergent HD.Marland Kitchen CXR showed acute pulmonary edema with infection less likely.  Noncontrast CT abdominal and pelvis showed new left 2.5 cm renal mass and stable 3.2 cm left adrenal nodule.from 2010.  Her EDW was lowered 1 kg for 7/27.  She has small IDWG and attends full treatments - Wed she left at 80.9 (EDW 82). She also had been seen in the ED 7/26 (Tuesday) for CP. EKG then showed NSR, trop 0.03.  Past Medical History  Diagnosis Date  . Hyperlipidemia   . Normal cardiac stress test 12/24/2009    lexiscan, imaging normal  . 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 Diverticulitis   . IBS (irritable bowel syndrome)   . Thyroid disease   . Depression   . Chronic kidney disease     Hemo MWF  . GERD (gastroesophageal reflux disease)   . H/O hiatal hernia   . Arthritis   . RLS (restless legs syndrome)   . Tubular adenoma of colon 01/2008  . Seizures     2004  . Constipation   . Sinus complaint   . Dialysis patient     kidney  . Renal disorder   . Adrenal mass   . Hypertension   . High cholesterol   . Back pain   . Meralgia paresthetica of right side 12/26/2014   Past Surgical History   Procedure Laterality Date  . Cardiac catheterization  2003    normal  . Cholecystectomy      Open mid line incision  . Frontal craniotomy  2002    indication = sinusitis  . Appendectomy    . Tubal ligation    . Abdominal hysterectomy    . Insertion of dialysis catheter      Left  . Revison of arteriovenous fistula  05/11/2012    Procedure: REVISON OF ARTERIOVENOUS FISTULA;  Surgeon: Elam Dutch, MD;  Location: [place];  Service: Vascular;  Laterality: Right;  . Av fistula placement Left 11/11/2012    Procedure: INSERTION OF ARTERIOVENOUS (AV) GORE-TEX GRAFT ARM;  Surgeon: Angelia Mould, MD;  Location: [place];  Service: Vascular;  Laterality: Left;  . Avgg removal Left 11/18/2012    Procedure: REMOVAL OF LEFT UPPER ARM ARTERIOVENOUS GORETEX GRAFT ([place]);  Surgeon: Angelia Mould, MD;  Location: [place];  Service: Vascular;  Laterality: Left;  . Patch angioplasty Left 11/18/2012    Procedure: PATCH ANGIOPLASTY;  Surgeon: Angelia Mould, MD;  Location: [place];  Service: Vascular;  Laterality: Left;  . Frontal cranialcity  2002   Family History  Problem Relation Age of Onset  . Thyroid cancer Mother   . Heart  disease Father   . Hypertension Father   . Heart attack Father   . Breast cancer Sister   . Lupus Daughter    Social History:  reports that she has been smoking Cigarettes.  She started smoking about 6 months ago. She has a 5 pack-year smoking history. She has never used smokeless tobacco. She reports that she does not drink alcohol or use illicit drugs. Allergies  Allergen Reactions  . Codeine Nausea And Vomiting  . Codeine Other (See Comments)    unknown  . Tuberculin Tests Hives    "blisters"  . Penicillins Rash    No problems breathing. Has tolerated omnicef in past without issue  . Sulfamethoxazole Rash   Prior to Admission medications   Medication Sig Start Date End Date Taking? Authorizing Provider  ABILIFY 2 MG tablet Take 0.5 tablets (1 mg  total) by mouth daily. 11/23/14   Coral Spikes, DO  allopurinol (ZYLOPRIM) 100 MG tablet TAKE ONE TABLET BY MOUTH ONCE DAILY ON  THE  DAYS  FOLLOWING  DIALYSIS  (TUESDAY,  THURSDAY  AND  SATURDAYS) 06/27/14   Tamela Oddi Hess, DO  amLODipine (NORVASC) 10 MG tablet TAKE ONE TABLET BY MOUTH ONCE DAILY 09/25/14   Coral Spikes, DO  aspirin EC 81 MG tablet Take 81 mg by mouth every morning.    Historical Provider, MD  atorvastatin (LIPITOR) 40 MG tablet TAKE ONE TABLET BY MOUTH ONCE DAILY 11/13/14   Coral Spikes, DO  carbamazepine (TEGRETOL) 200 MG tablet Take 1 tablet (200 mg total) by mouth 2 (two) times daily. 12/26/14   Kathrynn Ducking, MD  cinacalcet (SENSIPAR) 30 MG tablet Take 30 mg by mouth at bedtime.    Historical Provider, MD  cloNIDine (CATAPRES) 0.1 MG tablet Take 0.1-0.2 mg by mouth 2 (two) times daily. Take 1 tablet in the morning and 2 tablets in the evening    Historical Provider, MD  cyclobenzaprine (FLEXERIL) 5 MG tablet Take 1 tablet (5 mg total) by mouth 3 (three) times daily as needed for muscle spasms. 12/12/14   Rosemarie Ax, MD  diclofenac sodium (VOLTAREN) 1 % GEL Apply 4 g topically 4 (four) times daily. 12/23/14   April Palumbo, MD  fluticasone [facility]) 50 MCG/ACT nasal spray Place 2 sprays into both nostrils daily as needed for allergies or rhinitis.     Historical Provider, MD  folic acid-vitamin b complex-vitamin c-selenium-zinc (DIALYVITE) 3 MG TABS tablet Take 1 tablet by mouth daily.    Historical Provider, MD  HYDROcodone-acetaminophen (NORCO/VICODIN) 5-325 MG per tablet Take 1 tablet by mouth every 6 (six) hours as needed for moderate pain. 09/27/14   Coral Spikes, DO  lidocaine-prilocaine (EMLA) cream Apply 1 application topically 3 (three) times a week. Use before dialysis on MWF    Historical Provider, MD  loratadine (CLARITIN) 10 MG tablet Take 1 tablet (10 mg total) by mouth daily as needed for allergies. 09/27/14   Coral Spikes, DO  losartan (COZAAR) 100 MG tablet Take 1  tablet (100 mg total) by mouth daily. 09/27/14   Coral Spikes, DO  metoprolol succinate (TOPROL-XL) 100 MG 24 hr tablet Take 100 mg by mouth at bedtime. Take with or immediately following a meal.    Historical Provider, MD  omeprazole (PRILOSEC) 20 MG capsule Take 20 mg by mouth 2 (two) times daily.    Historical Provider, MD  oxyCODONE-acetaminophen (PERCOCET) 5-325 MG per tablet Take 1 tablet by mouth every 4 (four) hours  as needed for moderate pain. 2/33/43   Delora Fuel, MD  predniSONE (DELTASONE) 10 MG tablet Begin taking 6 tablets daily, taper by one tablet every other day until off the medication. 12/26/14   Kathrynn Ducking, MD  pregabalin (LYRICA) 75 MG capsule Take 75 mg by mouth every evening.     Historical Provider, MD  senna (SENOKOT) 8.6 MG tablet Take 2 tablets by mouth daily as needed. For constipation    Historical Provider, MD  sevelamer carbonate (RENVELA) 800 MG tablet Take 2,400 mg by mouth 3 (three) times daily with meals as needed (whenever food is consumed).     Historical Provider, MD  Sulfuric Acid-Sulf Phenolics 56-86 % SOLN Use as indicated once for your mouth lesion. 09/27/14   Coral Spikes, DO   Current Facility-Administered Medications  Medication Dose Route Frequency Provider Last Rate Last Dose  . 0.9 %  sodium chloride infusion  100 mL Intravenous PRN Alric Seton, PA-C      . 0.9 %  sodium chloride infusion  100 mL Intravenous PRN Alric Seton, PA-C      . alteplase (CATHFLO ACTIVASE) injection 2 mg  2 mg Intracatheter Once PRN Alric Seton, PA-C      . amLODipine (NORVASC) tablet 10 mg  10 mg Oral Daily Leonard Schwartz, MD   10 mg at 12/30/14 1313  . [START ON 12/31/2014] cloNIDine (CATAPRES) tablet 0.1 mg  0.1 mg Oral Daily Rachel L Rumbarger, RPH      . cloNIDine (CATAPRES) tablet 0.2 mg  0.2 mg Oral QHS Rachel L Rumbarger, RPH      . [START ON 01/03/2015] Darbepoetin Alfa (ARANESP) injection 25 mcg  25 mcg Intravenous Q Wed-HD Alric Seton, PA-C      .  Derrill Memo ON 01/01/2015] doxercalciferol (HECTOROL) injection 3 mcg  3 mcg Intravenous Q M,W,F-HD Alric Seton, PA-C      . feeding supplement (NEPRO CARB STEADY) liquid 237 mL  237 mL Oral PRN Alric Seton, PA-C      . heparin injection 1,000 Units  1,000 Units Dialysis PRN Alric Seton, PA-C      . heparin injection 1,600 Units  20 Units/kg Dialysis PRN Alric Seton, PA-C      . lidocaine (PF) (XYLOCAINE) 1 % injection 5 mL  5 mL Intradermal PRN Alric Seton, PA-C      . lidocaine-prilocaine (EMLA) cream 1 application  1 application Topical PRN Alric Seton, PA-C      . pentafluoroprop-tetrafluoroeth (GEBAUERS) aerosol 1 application  1 application Topical PRN Alric Seton, PA-C       Labs: Basic Metabolic Panel:  Recent Labs Lab 12/26/14 0037 12/30/14 1045  NA 135 138  K 3.3* 3.4*  CL 95* 98*  CO2 29 29  GLUCOSE 107* 102*  BUN 12 30*  CREATININE 5.07* 5.51*  CALCIUM 8.8* 8.3*   Liver Function Tests:  Recent Labs Lab 12/30/14 1045  AST 42*  ALT 35  ALKPHOS 126  BILITOT 0.5  PROT 7.1  ALBUMIN 3.4*   CBC:  Recent Labs Lab 12/26/14 0037 12/30/14 1045  WBC 8.5 10.5  NEUTROABS  --  7.3  HGB 9.2* 9.2*  HCT 27.6* 28.2*  MCV 86.0 89.5  PLT 127* 192  Studies/Results: Dg Chest Portable 1 View  12/30/2014   CLINICAL DATA:  Acute onset of shortness of breath and hypoxia.  EXAM: PORTABLE CHEST - 1 VIEW  COMPARISON:  12/26/2014  FINDINGS: Worsening diffuse interstitial infiltrates with asymmetric airspace disease in the right  perihilar region, most likely due to diffuse pulmonary edema. Infection is considered less likely. No evidence of pleural effusion. Heart size remains within normal limits. Left subclavian central venous catheter remains in stable position.  IMPRESSION: Worsening diffuse interstitial infiltrates and asymmetric right perihilar airspace disease, most likely due to acute pulmonary edema with infection considered less likely.   Electronically Signed    By: Earle Gell M.D.   On: 12/30/2014 13:42   Ct Renal Stone Study  12/30/2014   CLINICAL DATA:  74 year old female with left flank, abdominal and pelvic pain for 1 day. Nausea and vomiting. Initial encounter.  EXAM: CT ABDOMEN AND PELVIS WITHOUT CONTRAST  TECHNIQUE: Multidetector CT imaging of the abdomen and pelvis was performed following the standard protocol without IV contrast.  COMPARISON:  11/15/2008 CT  FINDINGS: Please note that parenchymal abnormalities may be missed without intravenous contrast.  Lower chest: A very small right pleural effusion is noted. Mild interstitial prominence in the lung bases are noted and may represent mild interstitial edema.  Hepatobiliary: The liver is unremarkable. The patient is status post cholecystectomy. There is no evidence of biliary dilatation.  Pancreas: Unremarkable  Spleen: Unremarkable  Adrenals/Urinary Tract: Moderate -severe bilateral renal atrophy is noted. A new 2.5 cm mass within the mid left kidney is identified. Bilateral renal cysts are again noted. There is no evidence of hydronephrosis or obstructing urinary calculi. A stable 1.3 x 2 cm left adrenal nodule is noted, almost certainly representing an adenoma. The bladder is unremarkable.  Stomach/Bowel: Descending and sigmoid colonic diverticulosis noted without diverticulitis. There is no evidence of bowel obstruction or definite bowel wall thickening.  Vascular/Lymphatic: There is no evidence of enlarged lymph nodes or abdominal aortic aneurysm. Abdominal aortic atherosclerotic calcifications again noted.  Reproductive: The patient is status post hysterectomy. There is no evidence of adnexal mass.  Other: No free fluid, focal collection or pneumoperitoneum.  Musculoskeletal: No acute or suspicious abnormalities. Mild multilevel degenerative disc disease and moderate facet arthropathy in the lower lumbar spine identified.  IMPRESSION: No evidence of acute abnormality within the abdomen or pelvis is.   New 2.5 cm left renal mass. Initial evaluation with ultrasound may or may not be helpful. Otherwise CT with and without contrast is recommended to coincide with dialysis.  New small right pleural effusion and bibasilar interstitial prominence which may represent pulmonary edema.  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  Colonic diverticulosis without evidence of diverticulitis.   Electronically Signed   By: Margarette Canada M.D.   On: 12/30/2014 11:30    ROS: As per HPI otherwise negative.  Physical Exam: Filed Vitals:   12/30/14 1600 12/30/14 1625 12/30/14 1630 12/30/14 1645  BP:  212/89 216/52 208/68  Pulse: 65 72 72 68  Temp:      TempSrc:      Resp: 16 23    Weight:      SpO2: 100% 100%       General: Well developed, well nourished, anxious AAF HEENT: Bipap in place Lungs:  Bilateral crackles Heart: RRR no rub Abdomen:  LLQ tenderness soft no rebound/guarding + BS M-S:  Strength and tone appear normal for age. Lower extremities:  without edema or ischemic changes, no open wounds  Neuro/psych: Alert and oriented X 3. Moves all extremities spontaneously. Dialysis Access: left upper HERO AVGG Qb 400  Dialysis Orders: Center: [facility] MWF 3.75 hours EDW 82 2 K 2 Ca 400/800 profile 4 left upper HERO  heparin 4000 Hectorol 3 Mircera 75 q 4 weeks - to start 7/13 BUT NEVER received; Recent Labs:  9.4 9/27 trending down ferritin 2435 7/27 Fe 122 59% sat - had been receiving venofer iPTH 412 7/27   Assessment/Plan: 1. Pulmonary edema/with chest pressure - due to excess volume; emergent HD -EDW lowered some this week; needs at least 2-3 kg more; additional work up per primary; repeat CXR in the am; hopefully we can wean of Bipap 2. LLQ pain - diverticulosis-by history; per primary, Afebrile, WBC 10.5 3. ESRD -  MWF - K 3.4 - had HD yesterday - needs lowering of EDW 4. Hypertension/volume  - given norvasc and clonidine today pre HD due to HTN, needs lower  volume 5. Anemia  - Hgb 9.2 - should have been started two weeks ago - missed dose - plan give Aranesp today 6. Metabolic bone disease -  Continue Hectorol next week 7. Nutrition - renal diet/ having unintentional weight loss - probably due to pain 8. Right thigh pain- worked up by Dr. Jannifer Franklin - thought to be meralgia paresthetica - cabamezepime increased + lyrica; Rx dose back 7/26; may need nerve block. 9. New left kidney mass - work up per primary -does not explain LLQ pain 10. Chronic anxiety/depression  11. Polypharmacy  Brianna Jacobson, PA-C [place] [phone] 12/30/2014, 4:56 PM   Pt seen, examined and agree w A/P as above. ESRD pt with longstanding HTN presented with abd pain then decompensated in ED with hypoxemia due to pulm edema (per CXR and CT).  BP's have been "running high" for a few weeks, they have been gradually challenging her dry wt at OP HD.  She is below dry wt today with pulm edema and uncontrolled HTN.  Suspect vol excess primary issue. Plan HD today and tomorrow, get vol down as BP / patient tolerate.  Kelly Splinter MD pager [phone]    cell [phone] 12/31/2014, 11:13 AM

## 2014-12-31 ENCOUNTER — Inpatient Hospital Stay (HOSPITAL_COMMUNITY): Payer: Medicare Other

## 2014-12-31 DIAGNOSIS — N186 End stage renal disease: Secondary | ICD-10-CM | POA: Diagnosis not present

## 2014-12-31 DIAGNOSIS — J811 Chronic pulmonary edema: Secondary | ICD-10-CM | POA: Insufficient documentation

## 2014-12-31 DIAGNOSIS — R109 Unspecified abdominal pain: Secondary | ICD-10-CM | POA: Insufficient documentation

## 2014-12-31 DIAGNOSIS — E1129 Type 2 diabetes mellitus with other diabetic kidney complication: Secondary | ICD-10-CM | POA: Diagnosis not present

## 2014-12-31 DIAGNOSIS — N2889 Other specified disorders of kidney and ureter: Secondary | ICD-10-CM | POA: Insufficient documentation

## 2014-12-31 DIAGNOSIS — I10 Essential (primary) hypertension: Secondary | ICD-10-CM | POA: Insufficient documentation

## 2014-12-31 DIAGNOSIS — Z992 Dependence on renal dialysis: Secondary | ICD-10-CM | POA: Diagnosis not present

## 2014-12-31 LAB — CBC
HCT: 27.6 % — ABNORMAL LOW (ref 36.0–46.0)
HCT: 30.5 % — ABNORMAL LOW (ref 36.0–46.0)
HEMOGLOBIN: 9.5 g/dL — AB (ref 12.0–15.0)
Hemoglobin: 9.4 g/dL — ABNORMAL LOW (ref 12.0–15.0)
MCH: 29.8 pg (ref 26.0–34.0)
MCH: 27.6 pg (ref 26.0–34.0)
MCHC: 34.4 g/dL (ref 30.0–36.0)
MCHC: 30.8 g/dL (ref 30.0–36.0)
MCV: 86.5 fL (ref 78.0–100.0)
MCV: 89.7 fL (ref 78.0–100.0)
PLATELETS: 177 10*3/uL (ref 150–400)
PLATELETS: 182 10*3/uL (ref 150–400)
RBC: 3.19 MIL/uL — ABNORMAL LOW (ref 3.87–5.11)
RBC: 3.4 MIL/uL — ABNORMAL LOW (ref 3.87–5.11)
RDW: 17.1 % — AB (ref 11.5–15.5)
RDW: 17.5 % — AB (ref 11.5–15.5)
WBC: 10.1 10*3/uL (ref 4.0–10.5)
WBC: 10.2 10*3/uL (ref 4.0–10.5)

## 2014-12-31 LAB — GLUCOSE, CAPILLARY
GLUCOSE-CAPILLARY: 139 mg/dL — AB (ref 65–99)
GLUCOSE-CAPILLARY: 70 mg/dL (ref 65–99)

## 2014-12-31 LAB — BASIC METABOLIC PANEL
ANION GAP: 12 (ref 5–15)
Anion gap: 10 (ref 5–15)
BUN: 12 mg/dL (ref 6–20)
BUN: 18 mg/dL (ref 6–20)
CHLORIDE: 96 mmol/L — AB (ref 101–111)
CO2: 25 mmol/L (ref 22–32)
CO2: 33 mmol/L — ABNORMAL HIGH (ref 22–32)
Calcium: 8.9 mg/dL (ref 8.9–10.3)
Calcium: 8.8 mg/dL — ABNORMAL LOW (ref 8.9–10.3)
Chloride: 90 mmol/L — ABNORMAL LOW (ref 101–111)
Creatinine, Ser: 3.83 mg/dL — ABNORMAL HIGH (ref 0.44–1.00)
Creatinine, Ser: 4.5 mg/dL — ABNORMAL HIGH (ref 0.44–1.00)
GFR calc Af Amer: 12 mL/min — ABNORMAL LOW (ref >60)
GFR calc non Af Amer: 11 mL/min — ABNORMAL LOW (ref >60)
GFR, EST AFRICAN AMERICAN: 10 mL/min — AB (ref >60)
GFR, EST NON AFRICAN AMERICAN: 9 mL/min — AB (ref >60)
GLUCOSE: 128 mg/dL — AB (ref 65–99)
Glucose, Bld: 107 mg/dL — ABNORMAL HIGH (ref 65–99)
POTASSIUM: 3.7 mmol/L (ref 3.5–5.1)
Potassium: 5.4 mmol/L — ABNORMAL HIGH (ref 3.5–5.1)
SODIUM: 133 mmol/L — AB (ref 135–145)
SODIUM: 133 mmol/L — AB (ref 135–145)

## 2014-12-31 LAB — MRSA PCR SCREENING: MRSA by PCR: NEGATIVE

## 2014-12-31 LAB — TROPONIN I
TROPONIN I: 0.03 ng/mL (ref <0.031)
TROPONIN I: 0.07 ng/mL — AB (ref <0.031)
TROPONIN I: 0.03 ng/mL (ref <0.031)

## 2014-12-31 LAB — CREATININE, SERUM
Creatinine, Ser: 3.03 mg/dL — ABNORMAL HIGH (ref 0.44–1.00)
GFR calc Af Amer: 17 mL/min — ABNORMAL LOW (ref >60)
GFR calc non Af Amer: 14 mL/min — ABNORMAL LOW (ref >60)

## 2014-12-31 LAB — TSH: TSH: 0.742 u[IU]/mL (ref 0.350–4.500)

## 2014-12-31 LAB — POTASSIUM: Potassium: 3.7 mmol/L (ref 3.5–5.1)

## 2014-12-31 LAB — HEPATITIS B SURFACE ANTIGEN: HEP B S AG: NEGATIVE

## 2014-12-31 MED ORDER — CLONIDINE HCL 0.1 MG PO TABS
0.1000 mg | ORAL_TABLET | ORAL | Status: DC | PRN
  Administered 2014-12-31: 0.1 mg via ORAL
  Filled 2014-12-31 (×2): qty 1

## 2014-12-31 MED ORDER — HEPARIN SODIUM (PORCINE) 1000 UNIT/ML DIALYSIS: 1000.0000 [IU] | INTRAMUSCULAR | Status: DC | PRN

## 2014-12-31 MED ORDER — LIDOCAINE-PRILOCAINE 2.5-2.5 % EX CREA: 1.0000 "application " | TOPICAL_CREAM | CUTANEOUS | Status: DC | PRN

## 2014-12-31 MED ORDER — NEPRO/CARBSTEADY PO LIQD: 237.0000 mL | ORAL | Status: DC | PRN

## 2014-12-31 MED ORDER — CLONIDINE HCL 0.2 MG PO TABS
0.2000 mg | ORAL_TABLET | Freq: Once | ORAL | Status: DC
  Filled 2014-12-31: qty 1

## 2014-12-31 MED ORDER — LIDOCAINE HCL (PF) 1 % IJ SOLN
5.0000 mL | INTRAMUSCULAR | Status: DC | PRN
  Filled 2014-12-31: qty 5

## 2014-12-31 MED ORDER — AMLODIPINE BESYLATE 10 MG PO TABS
10.0000 mg | ORAL_TABLET | Freq: Every day | ORAL | Status: DC
  Administered 2014-12-31: 10 mg via ORAL
  Filled 2014-12-31 (×2): qty 1

## 2014-12-31 MED ORDER — LIDOCAINE HCL (PF) 1 % IJ SOLN: 5.0000 mL | INTRAMUSCULAR | Status: DC | PRN

## 2014-12-31 MED ORDER — NICOTINE 21 MG/24HR TD PT24
21.0000 mg | MEDICATED_PATCH | Freq: Every day | TRANSDERMAL | Status: DC
  Administered 2014-12-31 – 2015-01-01 (×2): 21 mg via TRANSDERMAL
  Filled 2014-12-31 (×2): qty 1

## 2014-12-31 MED ORDER — LIDOCAINE-PRILOCAINE 2.5-2.5 % EX CREA
1.0000 "application " | TOPICAL_CREAM | CUTANEOUS | Status: DC | PRN
  Filled 2014-12-31: qty 5

## 2014-12-31 MED ORDER — HEPARIN SODIUM (PORCINE) 1000 UNIT/ML DIALYSIS
2000.0000 [IU] | INTRAMUSCULAR | Status: DC | PRN
  Filled 2014-12-31: qty 2

## 2014-12-31 MED ORDER — SODIUM CHLORIDE 0.9 % IV SOLN: 100.0000 mL | INTRAVENOUS | Status: DC | PRN

## 2014-12-31 MED ORDER — RENA-VITE PO TABS
1.0000 | ORAL_TABLET | Freq: Every day | ORAL | Status: DC
  Administered 2014-12-31: 1 via ORAL
  Filled 2014-12-31 (×2): qty 1

## 2014-12-31 MED ORDER — LOSARTAN POTASSIUM 50 MG PO TABS
100.0000 mg | ORAL_TABLET | Freq: Every day | ORAL | Status: DC
  Administered 2014-12-31: 100 mg via ORAL
  Filled 2014-12-31 (×2): qty 2

## 2014-12-31 MED ORDER — HEPARIN SODIUM (PORCINE) 1000 UNIT/ML DIALYSIS
1000.0000 [IU] | INTRAMUSCULAR | Status: DC | PRN
  Filled 2014-12-31: qty 1

## 2014-12-31 MED ORDER — ALTEPLASE 2 MG IJ SOLR
2.0000 mg | Freq: Once | INTRAMUSCULAR | Status: DC | PRN
  Filled 2014-12-31: qty 2

## 2014-12-31 MED ORDER — HYDRALAZINE HCL 20 MG/ML IJ SOLN: 5.0000 mg | INTRAMUSCULAR | Status: DC | PRN

## 2014-12-31 MED ORDER — NEPRO/CARBSTEADY PO LIQD
237.0000 mL | ORAL | Status: DC | PRN
  Filled 2014-12-31: qty 237

## 2014-12-31 MED ORDER — PENTAFLUOROPROP-TETRAFLUOROETH EX AERO
1.0000 | INHALATION_SPRAY | CUTANEOUS | Status: DC | PRN
Start: 2014-12-31 — End: 2014-12-31

## 2014-12-31 MED ORDER — PENTAFLUOROPROP-TETRAFLUOROETH EX AERO: 1.0000 "application " | INHALATION_SPRAY | CUTANEOUS | Status: DC | PRN

## 2014-12-31 MED ORDER — ALTEPLASE 2 MG IJ SOLR: 2.0000 mg | Freq: Once | INTRAMUSCULAR | Status: DC | PRN

## 2014-12-31 NOTE — Progress Notes (Signed)
Notified md about pt's sbp earlier and was 200's then went down to 160's.  Did not given the extra does of catapres.  Then SBP 150's.   Pt stated earlier "blood pressure runs in the 180's at home" pt resting.  Will continue to monitor. Saunders Revel T

## 2014-12-31 NOTE — Progress Notes (Signed)
Md notified of pt's sbp 218 and pt's request to eat something.  Will continue to monitor. Saunders Revel T

## 2014-12-31 NOTE — Progress Notes (Signed)
md notified of pt's arrival to unit from [facility].  Md aware of pt's sbp 200's.  Will try take pt off bibap and give po meds.  Will continue to monitor. Saunders Revel T

## 2014-12-31 NOTE — Progress Notes (Signed)
Pt placed on TELE box 10 to go to scheduled HD, HD nurse here to get pt and then post HD will transfer pt to 5W room 12. Family is aware of transfer, and took all belongings of pt with them until pt can get to her new room. NAD, Pt on room air 99%, VSS with no complaints of pain. SBAR report given to HD nurse as well as called to receiving nurse on 5W.

## 2014-12-31 NOTE — Progress Notes (Signed)
Md notified about pt's sbp still in 200's and pt c/o back pain.  Will continue to monitor. Saunders Revel T

## 2014-12-31 NOTE — Progress Notes (Signed)
Family Medicine Teaching Service Daily Progress Note Intern Pager: [phone]  Patient name: [name] Medical record number: [id number] Date of birth: [birth year] Age: 74 y.o. Gender: female  Primary Care Provider: Junie Panning, DO Consultants: Nephrology Code Status: Full  Pt Overview and Major Events to Date:  7/31 - Admitted with respiratory failure, L flank pain and LLQ pain  Assessment and Plan: [name] is a 74 y.o. female presenting with respiratory distress,  L flank pain and LLQ with hypoxia. PMH is significant for ESRD 2/2 FSG, on HD, Diverticulitis, IBS, HLD, HTN, and anemia.   Respiratory failure: Likely secondary to volume overload given CXR findings. No signs of PNA. Wells score 0. No EKG or troponins done at the outside ED. Given acute onset with no evident change in HD schedule, would like to r/o cardiac etiology.  - Nephrology consulted, appreciate assistance - HD session yesterday - Supplemental Oxygen as needed to keep sats >92% - Repeat CXR this morning with significantly improved pulmonary edema - Trend troponin - Will get echocardiogram - Consider PE work up if not improving.   Hypertensive Urgency: Patient with BPs as high as the 260s/80s during this admission, though baseline seems to be in the SBP 200-210 range.  No evidence of end organ damage. There was a stable 1.3 x 2 cm left adrenal nodule from 2010, possibly contributing. May also have a component of renal artery stenosis given 2.5cm left renal mass.  - resume home clonidine, metoprolol XL, amlodpine, and cozaar - continue to monitor  - Will start PRN hydralazine for goal SBP 200 today - will be careful to not lower too rapidly, will also need to carefully monitor DBP.  - Consider starting spironolactone - Consider further work-up for resistant hypertension (further renal imaging, plasma renin:aldosterone, etc)   ESRD 2/2 FSG on HD: Normally MWF. Has been on dialysis since 10/13. Continues to  make urine. - Renal following, appreciate recs - Continue home senispar and renvela  - continue Hectorol next week for metabolic bone disease per renal  LLQ pain/flank pain: CT negative for acute intra-abdominal process.  No kidney stones noted. No signs of pyelo.  Hepatic function normal. H/o IBS which may be contributing. Possibly due to constipation given history of small hard stools.  - continue to monitor - once off BiPaP and more ambulatory, consider MiraLax.  - serial abdominal exams  Left Renal Mass. Found on 7/30 abdominal CT. - Consider further work up with ultrasound vs CT with and without contrast.   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 Was prescribed a prednisone taper and increased Tegretol.  - continue steroid taper: prednisone 40mg  x 2d, 30mg  x2d, 20mg  x2d, then 10mg  x1d - continue home Lyrica and Tegretol.   Depression: stable  - continue Abilify   HLD:  - continue lipitor  FEN/GI: Renal diet/ SLIV / Protonix Prophylaxis: SQ hep  Disposition: Admitted to stepdown pending above work up.   Subjective:  Feels much better this morning, though states that she still has some soreness in her back and abdomen. Breathing is much better. Endorses some "soreness" in her chest.   Objective: Temp:  [97.6 F (36.4 C)-99.1 F (37.3 C)] 97.7 F (36.5 C) (07/31 0800) Pulse Rate:  [65-89] 77 (07/31 0538) Resp:  [14-40] 16 (07/31 0538) BP: (152-260)/(47-101) 226/86 mmHg (07/31 0800) SpO2:  [  74 %-100 %] 100 % (07/31 0800) FiO2 (%):  [50 %] 50 % (07/30 1434) Weight:  [175 lb 14.8 oz (79.8 kg)-181 lb (82.101 kg)] 179 lb 7.3 oz (81.4 kg) (07/31 0404) Physical Exam: General: 74yo woman in NAD sitting in bedside chair Cardiovascular: RRR, no murmurs appreciated Respiratory: NWOB on 2L nasal cannula. Crackles noted in  RLL field, otherwise CTAB Abdomen: Obese, S, ND, mild tenderness to palpation in LLQ. Extremities: WWP, no cyanosis or edema.   Laboratory:  Recent Labs Lab 12/26/14 0037 12/30/14 1045 12/31/14 0220  WBC 8.5 10.5 10.1  HGB 9.2* 9.2* 9.5*  HCT 27.6* 28.2* 27.6*  PLT 127* 192 177    Recent Labs Lab 12/26/14 0037 12/30/14 1045 12/31/14 0220  NA 135 138  --   K 3.3* 3.4*  --   CL 95* 98*  --   CO2 29 29  --   BUN 12 30*  --   CREATININE 5.07* 5.51* 3.03*  CALCIUM 8.8* 8.3*  --   PROT  --  7.1  --   BILITOT  --  0.5  --   ALKPHOS  --  126  --   ALT  --  35  --   AST  --  42*  --   GLUCOSE 107* 102*  --      Recent Labs Lab 12/30/14 2232 12/31/14 0220  TROPONINI 0.03 0.03   Imaging/Diagnostic Tests: None new.  Vivi Barrack, MD 12/31/2014, 8:46 AM PGY-2, [place] Intern pager: [phone], text pages welcome

## 2014-12-31 NOTE — Progress Notes (Signed)
[facility] Progress Note  Assessment/Plan: 1. Pulmonary edema/with chest pressure - due to excess volume emergent HD with net UF 1.9 L repeat CXR greatly improved; troponins 0.03 stable - EKG NAD NSR- acute hypoxia yesterday may have been induced by fentanyl given for pain at outside ED on top of other meds in the setting of volume excess. 2. LLQ pain - diverticulosis-by history; per primary, Afebrile, WBC 10.5, less painful today 3. ESRD - MWF - K 3.4 -use 4 K bath - HD again Monday titrate EDW down EDW as cramping allows 4. Hypertension/volume - BP up, didn't get usual losartan yesterday - will change med timing to usual home time i.e. HS for losartan, norvasc and MTP - added prn clonidine - may need to increase home dose of clonidine. 5. Anemia - Hgb 9.2 - should have been started two weeks ago - missed dose - dose given 7/30 , continue weekly 6. Metabolic bone disease - Continue Hectorol/renvela 7. Nutrition -resumed renal diet/ having unintentional weight loss - probably due to pain/ add vits 8. Right thigh pain- worked up by Dr. 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 New left kidney mass - -some deep left flank pain, had some yesterday, moreso today - defer work up to primary 10. Chronic anxiety/depression - on abilify 11. Polypharmacy 12. COPD/tobacco abuse - pt said she is willing to try nicotine patch -defer to primary  Brianna Jacobson, PA-C [place] [phone] 12/31/2014,8:43 AM  LOS: 1 day   Pt seen, examined and agree w A/P as above.  Short HD today and regular HD tomorrow, try to get wol down, BP should improve if vol down.  Doesn't tolerate large amounts of UF reportedly and didn't yesterday. CXR much better.  Kelly Splinter MD pager [phone]    cell [phone] 12/31/2014, 11:19 AM    Subjective:   Breathing better-  cramped on HD, many questions, no right thigh pain, some LLQ pain like when she had diverticulitis and left flank pain. Would like to eat.  Objective Filed Vitals:   12/31/14 0100 12/31/14 0404 12/31/14 0538 12/31/14 0800  BP: 152/47 228/71 218/76 226/86  Pulse: 67 74 77   Temp:  98.4 F (36.9 C)  97.7 F (36.5 C)  TempSrc:  Oral  Oral  Resp: 15 17 16    Height:      Weight:  81.4 kg (179 lb 7.3 oz)    SpO2: 99% 96% 100% 100%   Physical Exam General: NAD, mildly anxious Heart: RRR Lungs: no rales Abdomen: soft mild LLQ tenderness Extremities: no LE edema Dialysis Access: left upper HERO + bruit  Dialysis Orders: Center: [facility] MWF 3.75 hours EDW 82 2 K 2 Ca 400/800 profile 4 left upper HERO heparin 4000 Hectorol 3 Mircera 75 q 4 weeks - to start 7/13 BUT NEVER received; Recent Labs: 9.4 9/27 trending down ferritin 2435 7/27 Fe 122 59% sat - had been receiving venofer iPTH 412 7/27   Additional Objective Labs: Basic Metabolic Panel:  Recent Labs Lab 12/26/14 0037 12/30/14 1045 12/31/14 0220  NA 135 138  --   K 3.3* 3.4*  --   CL 95* 98*  --   CO2 29 29  --   GLUCOSE 107* 102*  --   BUN 12 30*  --   CREATININE 5.07* 5.51* 3.03*  CALCIUM 8.8* 8.3*  --  Liver Function Tests:  Recent Labs Lab 12/30/14 1045  AST 42*  ALT 35  ALKPHOS 126  BILITOT 0.5  PROT 7.1  ALBUMIN 3.4*   CBC:  Recent Labs Lab 12/26/14 0037 12/30/14 1045 12/31/14 0220  WBC 8.5 10.5 10.1  NEUTROABS  --  7.3  --   HGB 9.2* 9.2* 9.5*  HCT 27.6* 28.2* 27.6*  MCV 86.0 89.5 86.5  PLT 127* 192 177   Cardiac Enzymes:  Recent Labs Lab 12/30/14 2232 12/31/14 0220  TROPONINI 0.03 0.03   CBG:  Recent Labs Lab 12/30/14 2231 12/31/14 0051  GLUCAP 70 139*  Studies/Results: Dg Chest Portable 1 View  12/30/2014   CLINICAL DATA:  Acute onset of shortness of breath and hypoxia.  EXAM: PORTABLE CHEST - 1 VIEW  COMPARISON:  12/26/2014  FINDINGS: Worsening diffuse interstitial  infiltrates with asymmetric airspace disease in the right perihilar region, most likely due to diffuse pulmonary edema. Infection is considered less likely. No evidence of pleural effusion. Heart size remains within normal limits. Left subclavian central venous catheter remains in stable position.  IMPRESSION: Worsening diffuse interstitial infiltrates and asymmetric right perihilar airspace disease, most likely due to acute pulmonary edema with infection considered less likely.   Electronically Signed   By: Earle Gell M.D.   On: 12/30/2014 13:42   Ct Renal Stone Study  12/30/2014   CLINICAL DATA:  74 year old female with left flank, abdominal and pelvic pain for 1 day. Nausea and vomiting. Initial encounter.  EXAM: CT ABDOMEN AND PELVIS WITHOUT CONTRAST  TECHNIQUE: Multidetector CT imaging of the abdomen and pelvis was performed following the standard protocol without IV contrast.  COMPARISON:  11/15/2008 CT  FINDINGS: Please note that parenchymal abnormalities may be missed without intravenous contrast.  Lower chest: A very small right pleural effusion is noted. Mild interstitial prominence in the lung bases are noted and may represent mild interstitial edema.  Hepatobiliary: The liver is unremarkable. The patient is status post cholecystectomy. There is no evidence of biliary dilatation.  Pancreas: Unremarkable  Spleen: Unremarkable  Adrenals/Urinary Tract: Moderate -severe bilateral renal atrophy is noted. A new 2.5 cm mass within the mid left kidney is identified. Bilateral renal cysts are again noted. There is no evidence of hydronephrosis or obstructing urinary calculi. A stable 1.3 x 2 cm left adrenal nodule is noted, almost certainly representing an adenoma. The bladder is unremarkable.  Stomach/Bowel: Descending and sigmoid colonic diverticulosis noted without diverticulitis. There is no evidence of bowel obstruction or definite bowel wall thickening.  Vascular/Lymphatic: There is no evidence of  enlarged lymph nodes or abdominal aortic aneurysm. Abdominal aortic atherosclerotic calcifications again noted.  Reproductive: The patient is status post hysterectomy. There is no evidence of adnexal mass.  Other: No free fluid, focal collection or pneumoperitoneum.  Musculoskeletal: No acute or suspicious abnormalities. Mild multilevel degenerative disc disease and moderate facet arthropathy in the lower lumbar spine identified.  IMPRESSION: No evidence of acute abnormality within the abdomen or pelvis is.  New 2.5 cm left renal mass. Initial evaluation with ultrasound may or may not be helpful. Otherwise CT with and without contrast is recommended to coincide with dialysis.  New small right pleural effusion and bibasilar interstitial prominence which may represent pulmonary edema.  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  Colonic diverticulosis without evidence of diverticulitis.   Electronically Signed   By: Margarette Canada M.D.   On: 12/30/2014 11:30   Medications:   .  amLODipine  10 mg Oral Daily  . ARIPiprazole  1 mg Oral Daily  . aspirin EC  81 mg Oral q morning - 10a  . atorvastatin  40 mg Oral q1800  . carbamazepine  200 mg Oral BID  . cinacalcet  30 mg Oral Q supper  . cloNIDine  0.1 mg Oral Daily  . cloNIDine  0.2 mg Oral QHS  . [START ON 01/03/2015] darbepoetin (ARANESP) injection - DIALYSIS  25 mcg Intravenous Q Wed-HD  . [START ON 01/01/2015] doxercalciferol  3 mcg Intravenous Q M,W,F-HD  . heparin  5,000 Units Subcutaneous 3 times per day  . losartan  100 mg Oral Daily  . metoprolol succinate  100 mg Oral QHS  . pantoprazole  40 mg Oral Daily  . predniSONE  40 mg Oral Q breakfast   Followed by  . [START ON 01/02/2015] predniSONE  30 mg Oral Q breakfast   Followed by  . [START ON 01/04/2015] predniSONE  20 mg Oral Q breakfast   Followed by  . [START ON 01/06/2015] predniSONE  10 mg Oral Q breakfast  . pregabalin  75 mg Oral QPM  . sevelamer carbonate   2,400 mg Oral TID WC  . sodium chloride  3 mL Intravenous Q12H  . sodium chloride  3 mL Intravenous Q12H

## 2015-01-01 ENCOUNTER — Inpatient Hospital Stay (HOSPITAL_COMMUNITY): Payer: Medicare Other

## 2015-01-01 DIAGNOSIS — N2889 Other specified disorders of kidney and ureter: Secondary | ICD-10-CM | POA: Insufficient documentation

## 2015-01-01 DIAGNOSIS — E8770 Fluid overload, unspecified: Principal | ICD-10-CM

## 2015-01-01 LAB — CBC
HCT: 30.4 % — ABNORMAL LOW (ref 36.0–46.0)
Hemoglobin: 9.9 g/dL — ABNORMAL LOW (ref 12.0–15.0)
MCH: 29.2 pg (ref 26.0–34.0)
MCHC: 32.6 g/dL (ref 30.0–36.0)
MCV: 89.7 fL (ref 78.0–100.0)
PLATELETS: 199 10*3/uL (ref 150–400)
RBC: 3.39 MIL/uL — ABNORMAL LOW (ref 3.87–5.11)
RDW: 17.7 % — AB (ref 11.5–15.5)
WBC: 9 10*3/uL (ref 4.0–10.5)

## 2015-01-01 LAB — RENAL FUNCTION PANEL
ANION GAP: 10 (ref 5–15)
Albumin: 3 g/dL — ABNORMAL LOW (ref 3.5–5.0)
Albumin: 2.9 g/dL — ABNORMAL LOW (ref 3.5–5.0)
Anion gap: 8 (ref 5–15)
BUN: 17 mg/dL (ref 6–20)
BUN: 21 mg/dL — ABNORMAL HIGH (ref 6–20)
CALCIUM: 9.1 mg/dL (ref 8.9–10.3)
CHLORIDE: 101 mmol/L (ref 101–111)
CO2: 27 mmol/L (ref 22–32)
CO2: 29 mmol/L (ref 22–32)
CREATININE: 5.39 mg/dL — AB (ref 0.44–1.00)
Calcium: 9.1 mg/dL (ref 8.9–10.3)
Chloride: 96 mmol/L — ABNORMAL LOW (ref 101–111)
Creatinine, Ser: 4.73 mg/dL — ABNORMAL HIGH (ref 0.44–1.00)
GFR calc Af Amer: 8 mL/min — ABNORMAL LOW (ref >60)
GFR calc non Af Amer: 7 mL/min — ABNORMAL LOW (ref >60)
GFR, EST AFRICAN AMERICAN: 10 mL/min — AB (ref >60)
GFR, EST NON AFRICAN AMERICAN: 8 mL/min — AB (ref >60)
Glucose, Bld: 98 mg/dL (ref 65–99)
Glucose, Bld: 137 mg/dL — ABNORMAL HIGH (ref 65–99)
Phosphorus: 4.2 mg/dL (ref 2.5–4.6)
Phosphorus: 4.1 mg/dL (ref 2.5–4.6)
Potassium: 3.9 mmol/L (ref 3.5–5.1)
Potassium: 3.5 mmol/L (ref 3.5–5.1)
Sodium: 136 mmol/L (ref 135–145)
Sodium: 135 mmol/L (ref 135–145)

## 2015-01-01 MED ORDER — HEPARIN SODIUM (PORCINE) 1000 UNIT/ML DIALYSIS: 3000.0000 [IU] | Freq: Once | INTRAMUSCULAR | Status: DC

## 2015-01-01 MED ORDER — RENA-VITE PO TABS: 1.0000 | ORAL_TABLET | Freq: Every day | ORAL | Status: AC

## 2015-01-01 MED ORDER — PREDNISONE 10 MG PO TABS: 10.0000 mg | ORAL_TABLET | Freq: Every day | ORAL | Status: DC

## 2015-01-01 MED ORDER — PENTAFLUOROPROP-TETRAFLUOROETH EX AERO: 1.0000 "application " | INHALATION_SPRAY | CUTANEOUS | Status: DC | PRN

## 2015-01-01 MED ORDER — PREDNISONE 20 MG PO TABS
40.0000 mg | ORAL_TABLET | Freq: Once | ORAL | Status: AC
  Administered 2015-01-01: 40 mg via ORAL
  Filled 2015-01-01: qty 2

## 2015-01-01 MED ORDER — SODIUM CHLORIDE 0.9 % IV SOLN: 100.0000 mL | INTRAVENOUS | Status: DC | PRN

## 2015-01-01 MED ORDER — NICOTINE 21 MG/24HR TD PT24
21.0000 mg | MEDICATED_PATCH | Freq: Every day | TRANSDERMAL | Status: DC
  Administered 2015-01-01: 21 mg via TRANSDERMAL

## 2015-01-01 MED ORDER — DARBEPOETIN ALFA 25 MCG/0.42ML IJ SOSY: 25.0000 ug | PREFILLED_SYRINGE | INTRAMUSCULAR | Status: DC

## 2015-01-01 MED ORDER — LIDOCAINE HCL (PF) 1 % IJ SOLN
5.0000 mL | INTRAMUSCULAR | Status: DC | PRN
  Filled 2015-01-01: qty 5

## 2015-01-01 MED ORDER — ALTEPLASE 2 MG IJ SOLR
2.0000 mg | Freq: Once | INTRAMUSCULAR | Status: DC | PRN
  Filled 2015-01-01: qty 2

## 2015-01-01 MED ORDER — DOXERCALCIFEROL 4 MCG/2ML IV SOLN
INTRAVENOUS | Status: AC
  Filled 2015-01-01: qty 2

## 2015-01-01 MED ORDER — HEPARIN SODIUM (PORCINE) 1000 UNIT/ML DIALYSIS: 1000.0000 [IU] | INTRAMUSCULAR | Status: DC | PRN

## 2015-01-01 MED ORDER — NEPRO/CARBSTEADY PO LIQD: 237.0000 mL | ORAL | Status: DC | PRN

## 2015-01-01 MED ORDER — PREDNISONE 20 MG PO TABS: 40.0000 mg | ORAL_TABLET | Freq: Every day | ORAL | Status: DC

## 2015-01-01 MED ORDER — LIDOCAINE-PRILOCAINE 2.5-2.5 % EX CREA: 1.0000 "application " | TOPICAL_CREAM | CUTANEOUS | Status: DC | PRN

## 2015-01-01 NOTE — Discharge Summary (Signed)
[facility] Discharge Summary  Patient name: [name] Medical record number: [id number] Date of birth: [birth year] Age: 74 y.o. Gender: female Date of Admission: 12/30/2014  Date of Discharge: 01/01/2015 Admitting Physician: Zenia Resides, MD  Primary Care Provider: Junie Panning, DO Consultants: Nephrology  Indication for Hospitalization:  Pulmonary edema secondary to fluid over load  Discharge Diagnoses/Problem List:  Patient Active Problem List   Diagnosis Date Noted  . Pulmonary edema   . Renal mass   . End stage renal disease on dialysis   . Systolic hypertension   . Acute left flank pain   . Inadequate pain control 12/30/2014  . CHF (congestive heart failure) 12/30/2014  . Respiratory failure 12/30/2014  . Meralgia paresthetica of right side 12/26/2014  . Thigh pain 12/15/2014  . Mouth ulceration 09/28/2014  . Hereditary and idiopathic peripheral neuropathy 02/15/2013  . Dermatitis of face 08/19/2012  . End stage renal disease 05/06/2012  . Gastroparesis 08/23/2011  . HIP PAIN, BILATERAL 12/07/2008  . BLADDER PROLAPSE 11/17/2007  . OSTEOARTHRITIS, GENERALIZED, MULTIPLE JOINTS 08/18/2007  . SECONDARY HYPERPARATHYROIDISM 06/07/2007  . ANXIETY STATE, UNSPECIFIED 04/23/2007  . ANEMIA NEC 03/31/2007  . TOBACCO ABUSE 12/10/2006  . HIATAL HERNIA 12/10/2006  . Dysphagia, unspecified(787.20) 12/10/2006  . HYPERCHOLESTEROLEMIA 07/30/2006  . Gout, unspecified 07/30/2006  . DEPRESSIVE DISORDER, NOS 07/30/2006  . HYPERTENSION, BENIGN SYSTEMIC 07/30/2006  . GASTROESOPHAGEAL REFLUX, NO ESOPHAGITIS 07/30/2006     Disposition: Home  Discharge Condition: Stable  Discharge Exam:   Temp: [97.4 F (36.3 C)-98.7 F (37.1 C)] 97.4 F (36.3 C) (08/01 0603) Pulse Rate: [66-80] 72 (08/01 0603) Resp: [15-20] 18 (08/01 0603) BP: (145-209)/(48-78) 158/58 mmHg (08/01 0603) SpO2: [90 %-100 %] 99 % (08/01 0603) Weight: [171 lb 6.4 oz (77.747  kg)-176 lb 9.4 oz (80.1 kg)] 171 lb 15.3 oz (78 kg) (08/01 0603) Physical Exam: General: NAD eating breakfast Cardiovascular: RRR,  Respiratory: NWOB, CTAB Abdomen: Obese, soft, non tender + BS. Extremities: WWP, no cyanosis or edema.   Brief Hospital Course:  [name] is a 74 y.o. Female who presented with respiratory distress, hypoxia, L flank pain and LLQ . PMH is significant for ESRD 2/2 FSG, on HD, Diverticulitis, IBS, HLD, HTN, and anemia.   On admission to the emergency department was found to have pulmonary edema on CXR secondary to fluid overload. Nephrology was consulted and she was received dialysis until fluid status improved. On CXR she was also noted to have tracheal deviation. After speaking with radiology they felt this was likely secondary to low inspiratory effort vs deviation around the great vessels and recommended follow up when not acutely ill with 2 view CXR She was also noted to be hypertensive on admission which improved with dialysis and removal of fluid. She was also maintained on her home anti hypertensives. She received Dialysis on day of discharge to return to MWF dialysis schedule She additionally had left lower quadrant pain and left flank pain. She does have a history of diverticulosis. On CT abdomen, she was noted to have a left renal mass as well as an adrenal mass. On Renal US this mass was noted to represent a benign cyst. Her abdominal pain and flank pain resolved by day of discharge.  Given fluid overload without clear precipitating factor she will need an echocardiogram as an outpatient. As she was stable, she did not have one prior to discharge She additionally was being managed on a steroid taper by neurology as an outpatient  which was continued while inpatient and continued after discharge   Issues for Follow Up:  1. Repeat CXR 2. Will need echo as an outpatient 3. Adrenal mass work up- given persistent hypertension consider adrenal pathology  as a cause  Significant Procedures:  Transthoracic echo Dialysis  Significant Labs and Imaging:   Recent Labs Lab 12/30/14 1045 12/31/14 0220 12/31/14 0840  WBC 10.5 10.1 10.2  HGB 9.2* 9.5* 9.4*  HCT 28.2* 27.6* 30.5*  PLT 192 177 182    Recent Labs Lab 12/26/14 0037 12/30/14 1045 12/31/14 0220 12/31/14 0840 12/31/14 1434 12/31/14 1753 01/01/15 0658  NA 135 138  --  133* 133*  --  136  K 3.3* 3.4*  --  5.4* 3.7 3.7 3.9  CL 95* 98*  --  96* 90*  --  101  CO2 29 29  --  25 33*  --  27  GLUCOSE 107* 102*  --  107* 128*  --  98  BUN 12 30*  --  12 18  --  17  CREATININE 5.07* 5.51* 3.03* 3.83* 4.50*  --  4.73*  CALCIUM 8.8* 8.3*  --  8.9 8.8*  --  9.1  PHOS  --   --   --   --   --   --  4.2  ALKPHOS  --  126  --   --   --   --   --   AST  --  42*  --   --   --   --   --   ALT  --  35  --   --   --   --   --   ALBUMIN  --  3.4*  --   --   --   --  3.0*      Results/Tests Pending at Time of Discharge: None  Discharge Medications:    Medication List    ASK your doctor about these medications        ABILIFY 2 MG tablet  Generic drug:  ARIPiprazole  Take 0.5 tablets (1 mg total) by mouth daily.     allopurinol 100 MG tablet  Commonly known as:  ZYLOPRIM  TAKE ONE TABLET BY MOUTH ONCE DAILY ON  THE  DAYS  FOLLOWING  DIALYSIS  (TUESDAY,  THURSDAY  AND  SATURDAYS)     amLODipine 10 MG tablet  Commonly known as:  NORVASC  TAKE ONE TABLET BY MOUTH ONCE DAILY     aspirin EC 81 MG tablet  Take 81 mg by mouth every morning.     atorvastatin 40 MG tablet  Commonly known as:  LIPITOR  TAKE ONE TABLET BY MOUTH ONCE DAILY     carbamazepine 200 MG tablet  Commonly known as:  TEGRETOL  Take 1 tablet (200 mg total) by mouth 2 (two) times daily.     cinacalcet 30 MG tablet  Commonly known as:  SENSIPAR  Take 30 mg by mouth at bedtime.     cloNIDine 0.1 MG tablet  Commonly known as:  CATAPRES  Take 0.1-0.2 mg by mouth 2 (two) times daily. Take 1 tablet in the  morning and 2 tablets in the evening     cyclobenzaprine 5 MG tablet  Commonly known as:  FLEXERIL  Take 1 tablet (5 mg total) by mouth 3 (three) times daily as needed for muscle spasms.     diclofenac sodium 1 % Gel  Commonly known as:  VOLTAREN  Apply 4 g topically  4 (four) times daily.     fluticasone 50 MCG/ACT nasal spray  Commonly known as:  FLONASE  Place 2 sprays into both nostrils daily as needed for allergies or rhinitis.     folic acid-vitamin b complex-vitamin c-selenium-zinc 3 MG Tabs tablet  Take 1 tablet by mouth daily.     lidocaine-prilocaine cream  Commonly known as:  EMLA  Apply 1 application topically 3 (three) times a week. Use before dialysis on MWF     loratadine 10 MG tablet  Commonly known as:  CLARITIN  Take 1 tablet (10 mg total) by mouth daily as needed for allergies.     losartan 100 MG tablet  Commonly known as:  COZAAR  Take 1 tablet (100 mg total) by mouth daily.     metoprolol succinate 100 MG 24 hr tablet  Commonly known as:  TOPROL-XL  Take 100 mg by mouth at bedtime. Take with or immediately following a meal.     omeprazole 20 MG capsule  Commonly known as:  PRILOSEC  Take 20 mg by mouth 2 (two) times daily.     predniSONE 10 MG tablet  Commonly known as:  DELTASONE  Begin taking 6 tablets daily, taper by one tablet every other day until off the medication.     pregabalin 75 MG capsule  Commonly known as:  LYRICA  Take 75 mg by mouth every evening.     senna 8.6 MG tablet  Commonly known as:  SENOKOT  Take 2 tablets by mouth daily as needed. For constipation     sevelamer carbonate 800 MG tablet  Commonly known as:  RENVELA  Take 2,400 mg by mouth 3 (three) times daily with meals as needed (whenever food is consumed).        Discharge Instructions: Please refer to Patient Instructions section of EMR for full details.  Patient was counseled important signs and symptoms that should prompt return to medical care, changes in  medications, dietary instructions, activity restrictions, and follow up appointments.   Follow-Up Appointments: Follow-up Information    Follow up with [facility], DO On 01/09/2015.   Specialty:  Family Medicine   Why:  10 am   Contact information:   [address] [place] [address] [place], MD 01/01/2015, 2:37 PM PGY-2, [place]

## 2015-01-01 NOTE — Care Management Note (Signed)
Case Management Note  Patient Details  Name: [name] MRN: [id number] Date of Birth: 09-06-1940  Subjective/Objective:                 Patient from home. Lives with family. 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    Action/Plan:  Will continue to follow until discharge.  Expected Discharge Date:  01/01/15               Expected Discharge Plan:  Home/Self Care  In-House Referral:     Discharge planning Services  CM Consult  Post Acute Care Choice:    Choice offered to:     DME Arranged:    DME Agency:     HH Arranged:    HH Agency:     Status of Service:  Completed, signed off  Medicare Important Message Given:    Date Medicare IM Given:    Medicare IM give by:    Date Additional Medicare IM Given:    Additional Medicare Important Message give by:     If discussed at [place] of Stay Meetings, dates discussed:    Additional Comments:  Carles Collet, RN 01/01/2015, 11:23 AM

## 2015-01-01 NOTE — Progress Notes (Signed)
Patient discharge teaching given, including activity, diet, follow-up appoints, and medications. Patient verbalized understanding of all discharge instructions. IV access was d/c'd. Vitals are stable. Skin is intact except as charted in most recent assessments. Pt to be escorted out by NT, to be driven home by family. 

## 2015-01-01 NOTE — Progress Notes (Signed)
Family Medicine Teaching Service Daily Progress Note Intern Pager: [phone]  Patient name: [name] Medical record number: [id number] Date of birth: 10/08/1940 Age: 74 y.o. Gender: female  Primary Care Provider: Junie Panning, DO Consultants: Nephrology Code Status: Full  Pt Overview and Major Events to Date:  7/31 - Admitted with respiratory failure, L flank pain and LLQ pain  Assessment and Plan: [name] is a 74 y.o. female presenting with respiratory distress,  L flank pain and LLQ with hypoxia. PMH is significant for ESRD 2/2 FSG, on HD, Diverticulitis, IBS, HLD, HTN, and anemia.   Respiratory failure in setting of volume overload: .  - Nephrology consulted, appreciate assistance - HD session today - Supplemental Oxygen as needed to keep sats >92% - Will get echocardiogram, outpt - Consider PE work up if not improving.   Tracheal Deviation - Spoke with radiology who felt very low likelihood of mediastinal mass, likely deviation over great vessel in setting of low lung volumes - Rec repeat 2 view CXR  Hypertensive Urgency- improved:  - resume home clonidine, metoprolol XL, amlodpine, and cozaar - continue to monitor  - PRN hydralazine for goal SBP 200  - Consider further work-up for resistant hypertension (further renal imaging, plasma renin:aldosterone, etc)   ESRD 2/2 FSG on HD: Normally MWF. Has been on dialysis since 10/13. Continues to make urine. - Renal following, appreciate recs - Continue home senispar and renvela  - continue Hectorol next week for metabolic bone disease per renal  LLQ pain/flank pain- resolved - continue to monitor - once off BiPaP and more ambulatory, consider MiraLax.  - serial abdominal exams  Left Renal Mass. Found on 7/30 abdominal CT. - Consider further work up with ultrasound vs CT with and without contrast.   Anemia: Hgb 9.2 Per report, pt missed some doses of Aranesp  - continue Aranesp  Meralgia paresthetica-  Stable - continue steroid taper: prednisone 40mg  x 2d, 30mg  x2d, 20mg  x2d, then 10mg  x1d - continue home Lyrica and Tegretol.   Depression: stable  - continue Abilify   HLD:  - continue lipitor  FEN/GI: Renal diet/ SLIV / Protonix Prophylaxis: SQ hep  Disposition: Consider discharge today 8/1  Subjective:  Denies SOB and abd pain has resolved. Denies chest pain  Objective: Temp:  [97.4 F (36.3 C)-98.7 F (37.1 C)] 97.4 F (36.3 C) (08/01 0603) Pulse Rate:  [66-80] 72 (08/01 0603) Resp:  [15-20] 18 (08/01 0603) BP: (145-209)/(48-78) 158/58 mmHg (08/01 0603) SpO2:  [90 %-100 %] 99 % (08/01 0603) Weight:  [171 lb 6.4 oz (77.747 kg)-176 lb 9.4 oz (80.1 kg)] 171 lb 15.3 oz (78 kg) (08/01 0603) Physical Exam: General: NAD eating breakfast Cardiovascular: RRR,  Respiratory: NWOB, CTAB Abdomen: Obese, soft, non tender + BS. Extremities: WWP, no cyanosis or edema.   Laboratory:  Recent Labs Lab 12/30/14 1045 12/31/14 0220 12/31/14 0840  WBC 10.5 10.1 10.2  HGB 9.2* 9.5* 9.4*  HCT 28.2* 27.6* 30.5*  PLT 192 177 182    Recent Labs Lab 12/30/14 1045  12/31/14 0840 12/31/14 1434 12/31/14 1753 01/01/15 0658  NA 138  --  133* 133*  --  136  K 3.4*  --  5.4* 3.7 3.7 3.9  CL 98*  --  96* 90*  --  101  CO2 29  --  25 33*  --  27  BUN 30*  --  12 18  --  17  CREATININE 5.51*  < > 3.83* 4.50*  --  4.73*  CALCIUM 8.3*  --  8.9 8.8*  --  9.1  PROT 7.1  --   --   --   --   --   BILITOT 0.5  --   --   --   --   --   ALKPHOS 126  --   --   --   --   --   ALT 35  --   --   --   --   --   AST 42*  --   --   --   --   --   GLUCOSE 102*  --  107* 128*  --  98  < > = values in this interval not displayed.    Recent Labs Lab 12/30/14 2232 12/31/14 0220 12/31/14 0840 12/31/14 1753  TROPONINI 0.03 0.03 0.07* 0.03   Imaging/Diagnostic Tests: 7/30 CT abd: new left 2.5 cm renal mass 7/30 Renal US- Benign left renal cyst 7/30 CXR Pulmonary edema  Veatrice Bourbon,  MD 01/01/2015, 8:38 AM PGY-2, [place] Intern pager: [phone], text pages welcome

## 2015-01-01 NOTE — Discharge Instructions (Signed)
Continue dialysis schedule Continue Prednisone taper 40 mg on 8/2, 30 mg 2 days, then 20 mg for 2 days, then 10 mg for 1 day then stop  Follow-up Information    Follow up with Junie Panning, DO On 01/09/2015.   Specialty:  Family Medicine   Why:  10 am   Contact information:   [address] [address] [phone]

## 2015-01-01 NOTE — Progress Notes (Signed)
Subjective:   Feels much better after HD last night- no more chest pressure  Objective Filed Vitals:   12/31/14 1905 12/31/14 1954 12/31/14 2124 01/01/15 0603  BP: 161/62 170/64 185/59 158/58  Pulse: 80 75 74 72  Temp: 98.5 F (36.9 C) 98.7 F (37.1 C) 98.1 F (36.7 C) 97.4 F (36.3 C)  TempSrc: Oral Oral Oral Oral  Resp: 15 18 18 18   Height:      Weight: 78.1 kg (172 lb 2.9 oz) 77.747 kg (171 lb 6.4 oz)  78 kg (171 lb 15.3 oz)  SpO2: 100% 97% 99% 99%   Physical Exam General: alert and oriented, NAD. Sitting up on side of bed Heart: RRR Lungs: CTA, unlabored Abdomen: soft, nontender +BS Extremities: no edema Dialysis Access: L arm HeRO +b/t  Dialysis Orders: Center: [facility] MWF 3.75 hours EDW 82 2 K 2 Ca 400/800 profile 4 left upper HERO heparin 4000 Hectorol 3 Mircera 75 q 4 weeks - to start 7/13 BUT NEVER received; Recent Labs: 9.4 9/27 trending down ferritin 2435 7/27 Fe 122 59% sat - had been receiving venofer iPTH 412 7/27   Assessment/Plan: 1. Pulmonary edema/with chest pressure - due to excess volume HD sat repeat CXR greatly improved; troponins 0.03 stable - EKG NAD NSR. Under edw bt 4kgs- lower at DC. HD today to get back on schedule 2. LLQ pain - diverticulosis-by history; per primary, Afebrile, WBC 10.5,  3. ESRD - MWF - K 3.9 -use 4 K bath - HD again Monday titrate EDW down EDW as tolerated 4. Hypertension/volume - BP up but better, home losartan, norvasc and MTP - added prn clonidine - may need to increase home dose of clonidine.  5. Anemia - Hgb 10.2 - esa dose given 7/30 , continue weekly 6. Metabolic bone disease - Continue Hectorol/renvela 7. Nutrition -resumed renal diet/ having unintentional weight loss - probably due to pain/ add vits 8. Right thigh pain- worked up by Dr. 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 New left kidney mass - -some deep  left flank pain, had some yesterday- denies pain today. Benign cyst on [place] 10. Chronic anxiety/depression - on abilify 11. Polypharmacy 12. COPD/tobacco abuse - pt said she is willing to try nicotine patch -defer to primary  Shelle Iron, NP [place] [phone] 01/01/2015,9:18 AM  LOS: 2 days    Additional Objective Labs: Basic Metabolic Panel:  Recent Labs Lab 12/31/14 0840 12/31/14 1434 12/31/14 1753 01/01/15 0658  NA 133* 133*  --  136  K 5.4* 3.7 3.7 3.9  CL 96* 90*  --  101  CO2 25 33*  --  27  GLUCOSE 107* 128*  --  98  BUN 12 18  --  17  CREATININE 3.83* 4.50*  --  4.73*  CALCIUM 8.9 8.8*  --  9.1  PHOS  --   --   --  4.2   Liver Function Tests:  Recent Labs Lab 12/30/14 1045 01/01/15 0658  AST 42*  --   ALT 35  --   ALKPHOS 126  --   BILITOT 0.5  --   PROT 7.1  --   ALBUMIN 3.4* 3.0*   No results for input(s): LIPASE, AMYLASE in the last 168 hours. CBC:  Recent Labs Lab 12/26/14 0037 12/30/14 1045 12/31/14 0220 12/31/14 0840  WBC 8.5 10.5 10.1 10.2  NEUTROABS  --  7.3  --   --   HGB 9.2* 9.2* 9.5* 9.4*  HCT 27.6* 28.2* 27.6* 30.5*  MCV 86.0 89.5 86.5 89.7  PLT 127* 192 177 182   Blood Culture    Component Value Date/Time   SDES URINE, CATHETERIZED 11/14/2013 1549   SPECREQUEST NONE 11/14/2013 1549   CULT NO GROWTH Performed at [facility] 11/14/2013 1549   REPTSTATUS 11/15/2013 FINAL 11/14/2013 1549    Cardiac Enzymes:  Recent Labs Lab 12/30/14 2232 12/31/14 0220 12/31/14 0840 12/31/14 1753  TROPONINI 0.03 0.03 0.07* 0.03   CBG:  Recent Labs Lab 12/30/14 2231 12/31/14 0051  GLUCAP 70 139*   Iron Studies: No results for input(s): IRON, TIBC, TRANSFERRIN, FERRITIN in the last 72 hours. @lablastinr3 @ Studies/Results: US Renal  12/31/2014   CLINICAL DATA:  Mass seen on CT.  Chronic renal disease.  EXAM: RENAL / URINARY TRACT ULTRASOUND COMPLETE  COMPARISON:  CT 12/30/2014  FINDINGS: Right  Kidney:  Length: 7.4 cm. Parenchymal thinning and marked increased echogenicity consistent with chronic medical renal disease. Negative for hydronephrosis.  Left Kidney:  Length: 7.9 cm. The lesion observed on CT is a benign cyst measuring approximately 3 cm. No solid or complex masses are evident. There is marked parenchymal thinning and increased echogenicity consistent with chronic medical renal disease. There is no hydronephrosis.  Bladder:  The bladder was empty and could not be evaluated.  IMPRESSION: Benign cyst accounting for the CT abnormality. Both kidneys are atrophic with increased echogenicity consistent with medical renal disease.   Electronically Signed   By: Andreas Newport M.D.   On: 12/31/2014 22:36   Portable Chest 1 View  12/31/2014   CLINICAL DATA:  Respiratory failure  EXAM: PORTABLE CHEST - 1 VIEW  COMPARISON:  12/30/2014  FINDINGS: Left dialysis graft again noted, unchanged. There is mild cardiomegaly. Mild elevation of the right hemidiaphragm with right base atelectasis. Left lung is clear. No effusions. No acute bony abnormality.  IMPRESSION: Mild elevation the right hemidiaphragm with right base atelectasis.   Electronically Signed   By: Rolm Baptise M.D.   On: 12/31/2014 09:18   Dg Chest Portable 1 View  12/30/2014   CLINICAL DATA:  Acute onset of shortness of breath and hypoxia.  EXAM: PORTABLE CHEST - 1 VIEW  COMPARISON:  12/26/2014  FINDINGS: Worsening diffuse interstitial infiltrates with asymmetric airspace disease in the right perihilar region, most likely due to diffuse pulmonary edema. Infection is considered less likely. No evidence of pleural effusion. Heart size remains within normal limits. Left subclavian central venous catheter remains in stable position.  IMPRESSION: Worsening diffuse interstitial infiltrates and asymmetric right perihilar airspace disease, most likely due to acute pulmonary edema with infection considered less likely.   Electronically Signed   By:  Earle Gell M.D.   On: 12/30/2014 13:42   Ct Renal Stone Study  12/30/2014   CLINICAL DATA:  74 year old female with left flank, abdominal and pelvic pain for 1 day. Nausea and vomiting. Initial encounter.  EXAM: CT ABDOMEN AND PELVIS WITHOUT CONTRAST  TECHNIQUE: Multidetector CT imaging of the abdomen and pelvis was performed following the standard protocol without IV contrast.  COMPARISON:  11/15/2008 CT  FINDINGS: Please note that parenchymal abnormalities may be missed without intravenous contrast.  Lower chest: A very small right pleural effusion is noted. Mild interstitial prominence in the lung bases are noted and may represent mild interstitial edema.  Hepatobiliary: The liver is unremarkable. The patient is status post cholecystectomy. There is no  evidence of biliary dilatation.  Pancreas: Unremarkable  Spleen: Unremarkable  Adrenals/Urinary Tract: Moderate -severe bilateral renal atrophy is noted. A new 2.5 cm mass within the mid left kidney is identified. Bilateral renal cysts are again noted. There is no evidence of hydronephrosis or obstructing urinary calculi. A stable 1.3 x 2 cm left adrenal nodule is noted, almost certainly representing an adenoma. The bladder is unremarkable.  Stomach/Bowel: Descending and sigmoid colonic diverticulosis noted without diverticulitis. There is no evidence of bowel obstruction or definite bowel wall thickening.  Vascular/Lymphatic: There is no evidence of enlarged lymph nodes or abdominal aortic aneurysm. Abdominal aortic atherosclerotic calcifications again noted.  Reproductive: The patient is status post hysterectomy. There is no evidence of adnexal mass.  Other: No free fluid, focal collection or pneumoperitoneum.  Musculoskeletal: No acute or suspicious abnormalities. Mild multilevel degenerative disc disease and moderate facet arthropathy in the lower lumbar spine identified.  IMPRESSION: No evidence of acute abnormality within the abdomen or pelvis is.  New  2.5 cm left renal mass. Initial evaluation with ultrasound may or may not be helpful. Otherwise CT with and without contrast is recommended to coincide with dialysis.  New small right pleural effusion and bibasilar interstitial prominence which may represent pulmonary edema.  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  Colonic diverticulosis without evidence of diverticulitis.   Electronically Signed   By: Margarette Canada M.D.   On: 12/30/2014 11:30   Medications:   . amLODipine  10 mg Oral QHS  . ARIPiprazole  1 mg Oral Daily  . aspirin EC  81 mg Oral q morning - 10a  . atorvastatin  40 mg Oral q1800  . carbamazepine  200 mg Oral BID  . cinacalcet  30 mg Oral Q supper  . cloNIDine  0.1 mg Oral Daily  . cloNIDine  0.2 mg Oral QHS  . [START ON 01/03/2015] darbepoetin (ARANESP) injection - DIALYSIS  25 mcg Intravenous Q Wed-HD  . doxercalciferol  3 mcg Intravenous Q M,W,F-HD  . heparin  5,000 Units Subcutaneous 3 times per day  . losartan  100 mg Oral QHS  . metoprolol succinate  100 mg Oral QHS  . multivitamin  1 tablet Oral QHS  . nicotine  21 mg Transdermal Daily  . pantoprazole  40 mg Oral Daily  . predniSONE  40 mg Oral Q breakfast   Followed by  . [START ON 01/02/2015] predniSONE  30 mg Oral Q breakfast   Followed by  . [START ON 01/04/2015] predniSONE  20 mg Oral Q breakfast   Followed by  . [START ON 01/06/2015] predniSONE  10 mg Oral Q breakfast  . pregabalin  75 mg Oral QPM  . sevelamer carbonate  2,400 mg Oral TID WC  . sodium chloride  3 mL Intravenous Q12H    I have seen and examined this patient and agree with plan as outlined by B. Orvil Feil, NP.  Plan for HD and challenge EDW again. Delaina Fetsch A,MD 01/01/2015 10:12 AM

## 2015-01-01 NOTE — Progress Notes (Signed)
Utilization Review completed. Takayla Baillie RN BSN CM 

## 2015-01-02 ENCOUNTER — Telehealth: Payer: Self-pay | Admitting: *Deleted

## 2015-01-02 NOTE — Telephone Encounter (Signed)
[name] from [facility] called needing provider to call regarding Rx for Prednisone. Please call [phone]. Derl Barrow, RN

## 2015-01-03 DIAGNOSIS — N2581 Secondary hyperparathyroidism of renal origin: Secondary | ICD-10-CM | POA: Diagnosis not present

## 2015-01-03 DIAGNOSIS — N186 End stage renal disease: Secondary | ICD-10-CM | POA: Diagnosis not present

## 2015-01-03 DIAGNOSIS — D631 Anemia in chronic kidney disease: Secondary | ICD-10-CM | POA: Diagnosis not present

## 2015-01-04 ENCOUNTER — Ambulatory Visit
Admission: RE | Admit: 2015-01-04 | Discharge: 2015-01-04 | Disposition: A | Discharge status: Home / Self Care | Payer: Medicare Other | Source: Ambulatory Visit | Attending: Family Medicine | Admitting: Family Medicine

## 2015-01-04 ENCOUNTER — Encounter: Payer: Self-pay | Admitting: Family Medicine

## 2015-01-04 ENCOUNTER — Telehealth: Payer: Self-pay | Admitting: Family Medicine

## 2015-01-04 ENCOUNTER — Ambulatory Visit (INDEPENDENT_AMBULATORY_CARE_PROVIDER_SITE_OTHER): Payer: Medicare Other | Admitting: Family Medicine

## 2015-01-04 VITALS — BP 170/56 | HR 72 | Temp 98.3°F | Ht 63.0 in | Wt 173.3 lb

## 2015-01-04 DIAGNOSIS — K5900 Constipation, unspecified: Secondary | ICD-10-CM | POA: Diagnosis present

## 2015-01-04 DIAGNOSIS — R110 Nausea: Secondary | ICD-10-CM | POA: Diagnosis not present

## 2015-01-04 MED ORDER — LACTULOSE 10 GM/15ML PO SOLN
10.0000 g | Freq: Every day | ORAL | Status: DC
Start: 2015-01-04 — End: 2015-06-04

## 2015-01-04 MED ORDER — MINERAL OIL RE ENEM: 1.0000 | ENEMA | Freq: Once | RECTAL | Status: DC

## 2015-01-04 MED ORDER — POLYETHYLENE GLYCOL 3350 17 G PO PACK: PACK | ORAL | Status: DC

## 2015-01-04 NOTE — Telephone Encounter (Signed)
Pharmacy called regarding prednisone clarification of directions for taking it. Pt will be called to ensure she is able to get it and taking it correctly  Bryndon Cumbie A. Lincoln Brigham MD, [facility] Resident PGY-2 Pager [phone]

## 2015-01-04 NOTE — Progress Notes (Signed)
Patient ID: [name], female   DOB: Jun 07, 1940, 74 y.o.   MRN: [id number]   Patient's daughter called regarding concern over mother's continued constipation since discharge from the hospital earlier this week.  She reports that constipation was not addressed during hospitalization 2/2 severity of other illnesses at presentation.  She reports having given patient an enema last evening because her mother was experiencing abdominal discomfort.  Patient was able to pass a small amount of stool that appeared hard and pellet like.  They have not used any other medications/ remedies to relieve constipation.  They were instructed to avoid opioid medications.  Currently, no nausea, vomiting, severe abdominal pain.  Patient likely needing a stool clean out regimen.  Do not believe that patient needs to be seen emergently at this time.  Same day appointment with Dr Lonny Prude has been scheduled for 9:30 today.  Daughter voicing appreciation.  Emergent symptoms discussed.  Patient should seek immediate medical attention if nausea, vomiting, unable to stay hydrated, develops severe abdominal pain.  Kendle Turbin M. Lajuana Ripple, DO PGY-2, [place]

## 2015-01-04 NOTE — Telephone Encounter (Signed)
Daughter says she still hasnt heard anything about the mineral oil enema. She didn't not get it when she picked up the other RX. [facility] shhows receipt but daughter says it wasn't there Please advise

## 2015-01-04 NOTE — Patient Instructions (Signed)
Thank you for coming to see me today. It was a pleasure. Today we talked about:   Constipation: I am prescribing Lactulose and Mineral enema. You can use the mineral enema to soften the stool from below and the Lactulose will help to soften your stool for a bowel movement. I am ordering an x-ray of your stomach as well  If you have any questions or concerns, please do not hesitate to call the office at [phone].  Sincerely,  Cordelia Poche, MD

## 2015-01-04 NOTE — Telephone Encounter (Signed)
Patient states that she had two bowel movements and feels much better. She may not need the enema. She will pick it up if she needs it.

## 2015-01-04 NOTE — Telephone Encounter (Signed)
Will forward to Dr. Lincoln Brigham and Dr. Lonny Prude. Jazmin Hartsell,CMA

## 2015-01-04 NOTE — Telephone Encounter (Signed)
Pt seen by Dr. Lonny Prude today and was supposed to send mineral oil enema to [facility] on [address] [facility] [address] and they have not received it yet. They did receive the other two scripts rx'd. Pt daughter is expressing frustration and would like for this to be completed by 1pm so she can care for the pt.   Additionally, the same pharmacy has been trying to get in touch with Dr. Lincoln Brigham about the way the prednisone rx was written. Has needed to speak with prescribing doctor since patient's discharge on Monday 01/01/15.  Sending to [facility]. Sadie Reynolds, ASA

## 2015-01-04 NOTE — Progress Notes (Signed)
    Subjective   [name] is a 74 y.o. female that presents for a same day visit  1. Constipation: Symptoms started two weeks ago. She was on opiates for about 3 weeks total. Her stools are little balls. Her last bowel movements was last night. She does not remember passing gas. She has associated LLQ pain. No fevers, nausea or vomiting.   ROS Per HPI  History  Substance Use Topics  . Smoking status: Current Every Day Smoker -- 0.10 packs/day for 50 years    Types: Cigarettes    Start date: 06/16/2014  . Smokeless tobacco: Never Used     Comment: pt states she smokes "1" cig per day  . Alcohol Use: No    Allergies  Allergen Reactions  . Codeine Nausea And Vomiting  . Tuberculin Tests Hives    "blisters"  . Penicillins Rash    No problems breathing. Has tolerated omnicef in past without issue  . Sulfamethoxazole Rash    Objective   BP 170/56 mmHg  Pulse 72  Temp(Src) 98.3 F (36.8 C) (Oral)  Ht 5\' 3"  (1.6 m)  Wt 173 lb 4.8 oz (78.608 kg)  BMI 30.71 kg/m2  General: Well appearing, no distress Gastrointestinal: Mild tenderness in LLQ with no rebound or guarding. Bowel sounds present  Assessment and Plan   Meds ordered this encounter  Medications  . mineral oil enema    Sig: Place 133 mLs (1 enema total) rectally once.    Dispense:  133 mL    Refill:  0  . DISCONTD: polyethylene glycol (MIRALAX) packet    Sig: Use 17g twice daily until regular, soft stools, then use once daily. If having regular soft stools, can discontinue    Dispense:  30 each    Refill:  0  . lactulose (CHRONULAC) 10 GM/15ML solution    Sig: Take 15 mLs (10 g total) by mouth daily.    Dispense:  236 mL    Refill:  0    Constipation  Prescribe mineral oil enema  Lactuolose 10g daily  Avoid narcotics  KUB, upright to ensure no bowel obstruction

## 2015-01-05 DIAGNOSIS — N2581 Secondary hyperparathyroidism of renal origin: Secondary | ICD-10-CM | POA: Diagnosis not present

## 2015-01-05 DIAGNOSIS — N186 End stage renal disease: Secondary | ICD-10-CM | POA: Diagnosis not present

## 2015-01-05 DIAGNOSIS — D631 Anemia in chronic kidney disease: Secondary | ICD-10-CM | POA: Diagnosis not present

## 2015-01-08 ENCOUNTER — Other Ambulatory Visit (HOSPITAL_COMMUNITY): Payer: Self-pay | Admitting: Nephrology

## 2015-01-08 DIAGNOSIS — N186 End stage renal disease: Secondary | ICD-10-CM | POA: Diagnosis not present

## 2015-01-08 DIAGNOSIS — D631 Anemia in chronic kidney disease: Secondary | ICD-10-CM | POA: Diagnosis not present

## 2015-01-08 DIAGNOSIS — N2581 Secondary hyperparathyroidism of renal origin: Secondary | ICD-10-CM | POA: Diagnosis not present

## 2015-01-08 DIAGNOSIS — R609 Edema, unspecified: Secondary | ICD-10-CM

## 2015-01-09 ENCOUNTER — Ambulatory Visit: Payer: Medicare Other | Admitting: Family Medicine

## 2015-01-09 DIAGNOSIS — N186 End stage renal disease: Secondary | ICD-10-CM | POA: Diagnosis not present

## 2015-01-09 DIAGNOSIS — E8779 Other fluid overload: Secondary | ICD-10-CM | POA: Diagnosis not present

## 2015-01-10 DIAGNOSIS — N186 End stage renal disease: Secondary | ICD-10-CM | POA: Diagnosis not present

## 2015-01-10 DIAGNOSIS — I871 Compression of vein: Secondary | ICD-10-CM | POA: Diagnosis not present

## 2015-01-10 DIAGNOSIS — N2581 Secondary hyperparathyroidism of renal origin: Secondary | ICD-10-CM | POA: Diagnosis not present

## 2015-01-10 DIAGNOSIS — D631 Anemia in chronic kidney disease: Secondary | ICD-10-CM | POA: Diagnosis not present

## 2015-01-10 DIAGNOSIS — Z992 Dependence on renal dialysis: Secondary | ICD-10-CM | POA: Diagnosis not present

## 2015-01-10 DIAGNOSIS — T82858D Stenosis of vascular prosthetic devices, implants and grafts, subsequent encounter: Secondary | ICD-10-CM | POA: Diagnosis not present

## 2015-01-11 ENCOUNTER — Ambulatory Visit (HOSPITAL_COMMUNITY)
Admission: RE | Admit: 2015-01-11 | Discharge: 2015-01-11 | Disposition: A | Discharge status: Home / Self Care | Payer: Medicare Other | Source: Ambulatory Visit | Attending: Family Medicine | Admitting: Family Medicine

## 2015-01-11 DIAGNOSIS — I351 Nonrheumatic aortic (valve) insufficiency: Secondary | ICD-10-CM | POA: Insufficient documentation

## 2015-01-11 DIAGNOSIS — I071 Rheumatic tricuspid insufficiency: Secondary | ICD-10-CM | POA: Insufficient documentation

## 2015-01-11 DIAGNOSIS — E785 Hyperlipidemia, unspecified: Secondary | ICD-10-CM | POA: Insufficient documentation

## 2015-01-11 DIAGNOSIS — I129 Hypertensive chronic kidney disease with stage 1 through stage 4 chronic kidney disease, or unspecified chronic kidney disease: Secondary | ICD-10-CM | POA: Diagnosis not present

## 2015-01-11 DIAGNOSIS — I10 Essential (primary) hypertension: Secondary | ICD-10-CM

## 2015-01-11 DIAGNOSIS — R079 Chest pain, unspecified: Secondary | ICD-10-CM | POA: Diagnosis present

## 2015-01-11 DIAGNOSIS — I358 Other nonrheumatic aortic valve disorders: Secondary | ICD-10-CM | POA: Insufficient documentation

## 2015-01-11 DIAGNOSIS — R609 Edema, unspecified: Secondary | ICD-10-CM

## 2015-01-11 DIAGNOSIS — N189 Chronic kidney disease, unspecified: Secondary | ICD-10-CM | POA: Diagnosis not present

## 2015-01-11 NOTE — Progress Notes (Signed)
  Echocardiogram 2D Echocardiogram has been performed.  [name] M 01/11/2015, 9:53 AM

## 2015-01-11 NOTE — Progress Notes (Signed)
  Echocardiogram 2D Echocardiogram has been performed.  [name] M 01/11/2015, 9:52 AM

## 2015-01-12 DIAGNOSIS — D631 Anemia in chronic kidney disease: Secondary | ICD-10-CM | POA: Diagnosis not present

## 2015-01-12 DIAGNOSIS — N186 End stage renal disease: Secondary | ICD-10-CM | POA: Diagnosis not present

## 2015-01-12 DIAGNOSIS — N2581 Secondary hyperparathyroidism of renal origin: Secondary | ICD-10-CM | POA: Diagnosis not present

## 2015-01-15 DIAGNOSIS — N186 End stage renal disease: Secondary | ICD-10-CM | POA: Diagnosis not present

## 2015-01-15 DIAGNOSIS — N2581 Secondary hyperparathyroidism of renal origin: Secondary | ICD-10-CM | POA: Diagnosis not present

## 2015-01-15 DIAGNOSIS — D631 Anemia in chronic kidney disease: Secondary | ICD-10-CM | POA: Diagnosis not present

## 2015-01-17 DIAGNOSIS — N2581 Secondary hyperparathyroidism of renal origin: Secondary | ICD-10-CM | POA: Diagnosis not present

## 2015-01-17 DIAGNOSIS — N186 End stage renal disease: Secondary | ICD-10-CM | POA: Diagnosis not present

## 2015-01-17 DIAGNOSIS — D631 Anemia in chronic kidney disease: Secondary | ICD-10-CM | POA: Diagnosis not present

## 2015-01-19 DIAGNOSIS — N186 End stage renal disease: Secondary | ICD-10-CM | POA: Diagnosis not present

## 2015-01-19 DIAGNOSIS — N2581 Secondary hyperparathyroidism of renal origin: Secondary | ICD-10-CM | POA: Diagnosis not present

## 2015-01-19 DIAGNOSIS — D631 Anemia in chronic kidney disease: Secondary | ICD-10-CM | POA: Diagnosis not present

## 2015-01-22 DIAGNOSIS — N186 End stage renal disease: Secondary | ICD-10-CM | POA: Diagnosis not present

## 2015-01-22 DIAGNOSIS — N2581 Secondary hyperparathyroidism of renal origin: Secondary | ICD-10-CM | POA: Diagnosis not present

## 2015-01-22 DIAGNOSIS — D631 Anemia in chronic kidney disease: Secondary | ICD-10-CM | POA: Diagnosis not present

## 2015-01-24 DIAGNOSIS — N186 End stage renal disease: Secondary | ICD-10-CM | POA: Diagnosis not present

## 2015-01-24 DIAGNOSIS — N2581 Secondary hyperparathyroidism of renal origin: Secondary | ICD-10-CM | POA: Diagnosis not present

## 2015-01-24 DIAGNOSIS — D631 Anemia in chronic kidney disease: Secondary | ICD-10-CM | POA: Diagnosis not present

## 2015-01-26 DIAGNOSIS — D631 Anemia in chronic kidney disease: Secondary | ICD-10-CM | POA: Diagnosis not present

## 2015-01-26 DIAGNOSIS — N2581 Secondary hyperparathyroidism of renal origin: Secondary | ICD-10-CM | POA: Diagnosis not present

## 2015-01-26 DIAGNOSIS — N186 End stage renal disease: Secondary | ICD-10-CM | POA: Diagnosis not present

## 2015-01-29 DIAGNOSIS — N186 End stage renal disease: Secondary | ICD-10-CM | POA: Diagnosis not present

## 2015-01-29 DIAGNOSIS — N2581 Secondary hyperparathyroidism of renal origin: Secondary | ICD-10-CM | POA: Diagnosis not present

## 2015-01-29 DIAGNOSIS — D631 Anemia in chronic kidney disease: Secondary | ICD-10-CM | POA: Diagnosis not present

## 2015-01-31 DIAGNOSIS — N186 End stage renal disease: Secondary | ICD-10-CM | POA: Diagnosis not present

## 2015-01-31 DIAGNOSIS — Z992 Dependence on renal dialysis: Secondary | ICD-10-CM | POA: Diagnosis not present

## 2015-01-31 DIAGNOSIS — D631 Anemia in chronic kidney disease: Secondary | ICD-10-CM | POA: Diagnosis not present

## 2015-01-31 DIAGNOSIS — N2581 Secondary hyperparathyroidism of renal origin: Secondary | ICD-10-CM | POA: Diagnosis not present

## 2015-01-31 DIAGNOSIS — E1129 Type 2 diabetes mellitus with other diabetic kidney complication: Secondary | ICD-10-CM | POA: Diagnosis not present

## 2015-02-02 DIAGNOSIS — Z23 Encounter for immunization: Secondary | ICD-10-CM | POA: Diagnosis not present

## 2015-02-02 DIAGNOSIS — D631 Anemia in chronic kidney disease: Secondary | ICD-10-CM | POA: Diagnosis not present

## 2015-02-02 DIAGNOSIS — N186 End stage renal disease: Secondary | ICD-10-CM | POA: Diagnosis not present

## 2015-02-02 DIAGNOSIS — N2581 Secondary hyperparathyroidism of renal origin: Secondary | ICD-10-CM | POA: Diagnosis not present

## 2015-02-05 DIAGNOSIS — D631 Anemia in chronic kidney disease: Secondary | ICD-10-CM | POA: Diagnosis not present

## 2015-02-05 DIAGNOSIS — Z23 Encounter for immunization: Secondary | ICD-10-CM | POA: Diagnosis not present

## 2015-02-05 DIAGNOSIS — N2581 Secondary hyperparathyroidism of renal origin: Secondary | ICD-10-CM | POA: Diagnosis not present

## 2015-02-05 DIAGNOSIS — N186 End stage renal disease: Secondary | ICD-10-CM | POA: Diagnosis not present

## 2015-02-07 DIAGNOSIS — N2581 Secondary hyperparathyroidism of renal origin: Secondary | ICD-10-CM | POA: Diagnosis not present

## 2015-02-07 DIAGNOSIS — Z23 Encounter for immunization: Secondary | ICD-10-CM | POA: Diagnosis not present

## 2015-02-07 DIAGNOSIS — N186 End stage renal disease: Secondary | ICD-10-CM | POA: Diagnosis not present

## 2015-02-07 DIAGNOSIS — D631 Anemia in chronic kidney disease: Secondary | ICD-10-CM | POA: Diagnosis not present

## 2015-02-09 DIAGNOSIS — N186 End stage renal disease: Secondary | ICD-10-CM | POA: Diagnosis not present

## 2015-02-09 DIAGNOSIS — D631 Anemia in chronic kidney disease: Secondary | ICD-10-CM | POA: Diagnosis not present

## 2015-02-09 DIAGNOSIS — Z23 Encounter for immunization: Secondary | ICD-10-CM | POA: Diagnosis not present

## 2015-02-09 DIAGNOSIS — N2581 Secondary hyperparathyroidism of renal origin: Secondary | ICD-10-CM | POA: Diagnosis not present

## 2015-02-12 ENCOUNTER — Encounter: Payer: Medicare Other | Admitting: Interventional Cardiology

## 2015-02-13 DIAGNOSIS — Z23 Encounter for immunization: Secondary | ICD-10-CM | POA: Diagnosis not present

## 2015-02-13 DIAGNOSIS — N2581 Secondary hyperparathyroidism of renal origin: Secondary | ICD-10-CM | POA: Diagnosis not present

## 2015-02-13 DIAGNOSIS — D631 Anemia in chronic kidney disease: Secondary | ICD-10-CM | POA: Diagnosis not present

## 2015-02-13 DIAGNOSIS — N186 End stage renal disease: Secondary | ICD-10-CM | POA: Diagnosis not present

## 2015-02-14 DIAGNOSIS — N186 End stage renal disease: Secondary | ICD-10-CM | POA: Diagnosis not present

## 2015-02-14 DIAGNOSIS — Z23 Encounter for immunization: Secondary | ICD-10-CM | POA: Diagnosis not present

## 2015-02-14 DIAGNOSIS — D631 Anemia in chronic kidney disease: Secondary | ICD-10-CM | POA: Diagnosis not present

## 2015-02-14 DIAGNOSIS — N2581 Secondary hyperparathyroidism of renal origin: Secondary | ICD-10-CM | POA: Diagnosis not present

## 2015-02-15 ENCOUNTER — Ambulatory Visit (INDEPENDENT_AMBULATORY_CARE_PROVIDER_SITE_OTHER): Payer: Medicare Other | Admitting: Family Medicine

## 2015-02-15 VITALS — BP 143/46 | HR 72 | Temp 97.7°F | Ht 63.0 in | Wt 176.1 lb

## 2015-02-15 DIAGNOSIS — L609 Nail disorder, unspecified: Secondary | ICD-10-CM

## 2015-02-15 DIAGNOSIS — M79674 Pain in right toe(s): Secondary | ICD-10-CM | POA: Diagnosis not present

## 2015-02-15 DIAGNOSIS — L608 Other nail disorders: Secondary | ICD-10-CM

## 2015-02-15 MED ORDER — OXYCODONE-ACETAMINOPHEN 10-325 MG PO TABS
1.0000 | ORAL_TABLET | Freq: Three times a day (TID) | ORAL | Status: DC | PRN
Start: 2015-02-15 — End: 2015-02-15

## 2015-02-15 MED ORDER — OXYCODONE-ACETAMINOPHEN 5-325 MG PO TABS: 1.0000 | ORAL_TABLET | ORAL | Status: DC | PRN

## 2015-02-16 DIAGNOSIS — N186 End stage renal disease: Secondary | ICD-10-CM | POA: Diagnosis not present

## 2015-02-16 DIAGNOSIS — N2581 Secondary hyperparathyroidism of renal origin: Secondary | ICD-10-CM | POA: Diagnosis not present

## 2015-02-16 DIAGNOSIS — Z23 Encounter for immunization: Secondary | ICD-10-CM | POA: Diagnosis not present

## 2015-02-16 DIAGNOSIS — D631 Anemia in chronic kidney disease: Secondary | ICD-10-CM | POA: Diagnosis not present

## 2015-02-16 NOTE — Progress Notes (Signed)
Patient ID: [name], female   DOB: [birth year], 74 y.o.   MRN: [id number] She is here for toenail removal. We removed this toenail several years ago in 9 bladed the nailbed that time. It did well for about a year and a half and then over the last year it has grown back and is even more curved and deformed than before. He is quite uncomfortable for her. She wishes to have it removed again, hopefully without coming back again.  PROCEDURE NOTE: Toenail removal with nailbed ablation.  Patient given informed consent, signed copy in the chart. Appropriate time out taken.  Large rubberband tightened with forceps used as tourniquet placed at base of the toe.  Area prepped and draped in usual sterile fashion.  Digital block done using 2% lidocaine without epinephrine, 6cc total. The nail is quite deformed, curved, sticking of well above the toe. There is no sign of infection. The entire. Nail elevated using nail elevator, removed using hemostats and traction force.  Nail bed was ablated using curettage and phenol followed by copious alcohol wash.  No complications.  Minimal bleeding.  Sterile bandage applied and post procedure instructions given.  Patient tolerated procedure well without complications.

## 2015-02-19 DIAGNOSIS — N186 End stage renal disease: Secondary | ICD-10-CM | POA: Diagnosis not present

## 2015-02-19 DIAGNOSIS — D631 Anemia in chronic kidney disease: Secondary | ICD-10-CM | POA: Diagnosis not present

## 2015-02-19 DIAGNOSIS — Z23 Encounter for immunization: Secondary | ICD-10-CM | POA: Diagnosis not present

## 2015-02-19 DIAGNOSIS — N2581 Secondary hyperparathyroidism of renal origin: Secondary | ICD-10-CM | POA: Diagnosis not present

## 2015-02-21 ENCOUNTER — Telehealth: Payer: Self-pay | Admitting: Family Medicine

## 2015-02-21 DIAGNOSIS — D631 Anemia in chronic kidney disease: Secondary | ICD-10-CM | POA: Diagnosis not present

## 2015-02-21 DIAGNOSIS — Z23 Encounter for immunization: Secondary | ICD-10-CM | POA: Diagnosis not present

## 2015-02-21 DIAGNOSIS — N2581 Secondary hyperparathyroidism of renal origin: Secondary | ICD-10-CM | POA: Diagnosis not present

## 2015-02-21 DIAGNOSIS — N186 End stage renal disease: Secondary | ICD-10-CM | POA: Diagnosis not present

## 2015-02-21 NOTE — Telephone Encounter (Signed)
Pt recently had her toenail removed and she has forgotten her care instructions. She states that these instructions were given to her verbally. Pt would appreciate a phone call at the earliest convenience to help her with this. Thank you, Fonda Kinder, ASA

## 2015-02-21 NOTE — Telephone Encounter (Signed)
Advised patient to stop the antibiotic cream, apply a regular band aid.  Offered a nurse visit in for in the morning or in the PM with PCP.  Patient stated she will call in the AM to make an appointment if she decides to have someone to look at her toe.  Derl Barrow, RN

## 2015-02-21 NOTE — Telephone Encounter (Signed)
It is probably too moist---STOP the antibiotic cream, OK to use a regular size bandaid--I just gave her those for coneenience.  Take bandaid OFF at night. If she wants someone to look at it have her ocme to nurse visit,I am here in AM Thursday in small conf room and u can get me out---gone to [place] in PM [facility]! Dorcas Mcmurray

## 2015-02-21 NOTE — Telephone Encounter (Signed)
Call patient back given her instruction to use the big brown band aids for a week.  If she got the band aid wet to use alcohol or peroxide to clean.  Patient stated she is out of the band aids and have been using the antibiotic cream, but the whole nail bed is very red.  Patient denies any swelling, fever or drainage of the area.  Will forward to Dr. Nori Riis.  Please advise.  Derl Barrow, RN

## 2015-02-23 DIAGNOSIS — N186 End stage renal disease: Secondary | ICD-10-CM | POA: Diagnosis not present

## 2015-02-23 DIAGNOSIS — N2581 Secondary hyperparathyroidism of renal origin: Secondary | ICD-10-CM | POA: Diagnosis not present

## 2015-02-23 DIAGNOSIS — Z23 Encounter for immunization: Secondary | ICD-10-CM | POA: Diagnosis not present

## 2015-02-23 DIAGNOSIS — D631 Anemia in chronic kidney disease: Secondary | ICD-10-CM | POA: Diagnosis not present

## 2015-02-24 ENCOUNTER — Other Ambulatory Visit: Payer: Self-pay | Admitting: Family Medicine

## 2015-02-26 ENCOUNTER — Other Ambulatory Visit: Payer: Self-pay | Admitting: *Deleted

## 2015-02-26 DIAGNOSIS — Z23 Encounter for immunization: Secondary | ICD-10-CM | POA: Diagnosis not present

## 2015-02-26 DIAGNOSIS — N186 End stage renal disease: Secondary | ICD-10-CM | POA: Diagnosis not present

## 2015-02-26 DIAGNOSIS — N2581 Secondary hyperparathyroidism of renal origin: Secondary | ICD-10-CM | POA: Diagnosis not present

## 2015-02-26 DIAGNOSIS — D631 Anemia in chronic kidney disease: Secondary | ICD-10-CM | POA: Diagnosis not present

## 2015-02-26 MED ORDER — ATORVASTATIN CALCIUM 40 MG PO TABS: 40.0000 mg | ORAL_TABLET | Freq: Every day | ORAL | Status: AC

## 2015-02-28 DIAGNOSIS — D631 Anemia in chronic kidney disease: Secondary | ICD-10-CM | POA: Diagnosis not present

## 2015-02-28 DIAGNOSIS — N186 End stage renal disease: Secondary | ICD-10-CM | POA: Diagnosis not present

## 2015-02-28 DIAGNOSIS — Z23 Encounter for immunization: Secondary | ICD-10-CM | POA: Diagnosis not present

## 2015-02-28 DIAGNOSIS — N2581 Secondary hyperparathyroidism of renal origin: Secondary | ICD-10-CM | POA: Diagnosis not present

## 2015-03-01 ENCOUNTER — Ambulatory Visit: Payer: Medicare Other | Admitting: Family Medicine

## 2015-03-02 DIAGNOSIS — E1129 Type 2 diabetes mellitus with other diabetic kidney complication: Secondary | ICD-10-CM | POA: Diagnosis not present

## 2015-03-02 DIAGNOSIS — N2581 Secondary hyperparathyroidism of renal origin: Secondary | ICD-10-CM | POA: Diagnosis not present

## 2015-03-02 DIAGNOSIS — N186 End stage renal disease: Secondary | ICD-10-CM | POA: Diagnosis not present

## 2015-03-02 DIAGNOSIS — D631 Anemia in chronic kidney disease: Secondary | ICD-10-CM | POA: Diagnosis not present

## 2015-03-02 DIAGNOSIS — Z992 Dependence on renal dialysis: Secondary | ICD-10-CM | POA: Diagnosis not present

## 2015-03-02 DIAGNOSIS — Z23 Encounter for immunization: Secondary | ICD-10-CM | POA: Diagnosis not present

## 2015-03-06 ENCOUNTER — Other Ambulatory Visit: Payer: Self-pay | Admitting: Vascular Surgery

## 2015-03-06 DIAGNOSIS — N2581 Secondary hyperparathyroidism of renal origin: Secondary | ICD-10-CM | POA: Diagnosis not present

## 2015-03-06 DIAGNOSIS — D631 Anemia in chronic kidney disease: Secondary | ICD-10-CM | POA: Diagnosis not present

## 2015-03-06 DIAGNOSIS — N186 End stage renal disease: Secondary | ICD-10-CM | POA: Diagnosis not present

## 2015-03-07 DIAGNOSIS — N2581 Secondary hyperparathyroidism of renal origin: Secondary | ICD-10-CM | POA: Diagnosis not present

## 2015-03-07 DIAGNOSIS — N186 End stage renal disease: Secondary | ICD-10-CM | POA: Diagnosis not present

## 2015-03-07 DIAGNOSIS — D631 Anemia in chronic kidney disease: Secondary | ICD-10-CM | POA: Diagnosis not present

## 2015-03-08 ENCOUNTER — Encounter
Admission: RE | Disposition: A | Discharge status: Home / Self Care | Payer: Self-pay | Source: Ambulatory Visit | Attending: Vascular Surgery

## 2015-03-08 ENCOUNTER — Ambulatory Visit
Admission: RE | Admit: 2015-03-08 | Discharge: 2015-03-08 | Disposition: A | Discharge status: Home / Self Care | Payer: Medicare Other | Source: Ambulatory Visit | Attending: Vascular Surgery | Admitting: Vascular Surgery

## 2015-03-08 DIAGNOSIS — E079 Disorder of thyroid, unspecified: Secondary | ICD-10-CM | POA: Insufficient documentation

## 2015-03-08 DIAGNOSIS — F17200 Nicotine dependence, unspecified, uncomplicated: Secondary | ICD-10-CM | POA: Diagnosis not present

## 2015-03-08 DIAGNOSIS — F329 Major depressive disorder, single episode, unspecified: Secondary | ICD-10-CM | POA: Diagnosis not present

## 2015-03-08 DIAGNOSIS — G2581 Restless legs syndrome: Secondary | ICD-10-CM | POA: Diagnosis not present

## 2015-03-08 DIAGNOSIS — I120 Hypertensive chronic kidney disease with stage 5 chronic kidney disease or end stage renal disease: Secondary | ICD-10-CM | POA: Insufficient documentation

## 2015-03-08 DIAGNOSIS — Z992 Dependence on renal dialysis: Secondary | ICD-10-CM | POA: Diagnosis not present

## 2015-03-08 DIAGNOSIS — N186 End stage renal disease: Secondary | ICD-10-CM | POA: Insufficient documentation

## 2015-03-08 DIAGNOSIS — T82858A Stenosis of vascular prosthetic devices, implants and grafts, initial encounter: Secondary | ICD-10-CM | POA: Insufficient documentation

## 2015-03-08 DIAGNOSIS — Z9049 Acquired absence of other specified parts of digestive tract: Secondary | ICD-10-CM | POA: Diagnosis not present

## 2015-03-08 DIAGNOSIS — K589 Irritable bowel syndrome without diarrhea: Secondary | ICD-10-CM | POA: Insufficient documentation

## 2015-03-08 DIAGNOSIS — E7800 Pure hypercholesterolemia, unspecified: Secondary | ICD-10-CM | POA: Diagnosis not present

## 2015-03-08 DIAGNOSIS — Y832 Surgical operation with anastomosis, bypass or graft as the cause of abnormal reaction of the patient, or of later complication, without mention of misadventure at the time of the procedure: Secondary | ICD-10-CM | POA: Insufficient documentation

## 2015-03-08 DIAGNOSIS — K219 Gastro-esophageal reflux disease without esophagitis: Secondary | ICD-10-CM | POA: Insufficient documentation

## 2015-03-08 DIAGNOSIS — E785 Hyperlipidemia, unspecified: Secondary | ICD-10-CM | POA: Insufficient documentation

## 2015-03-08 HISTORY — PX: PERIPHERAL VASCULAR CATHETERIZATION: SHX172C

## 2015-03-08 LAB — POTASSIUM (ARMC VASCULAR LAB ONLY): POTASSIUM (ARMC VASCULAR LAB): 4.2

## 2015-03-08 SURGERY — A/V SHUNTOGRAM/FISTULAGRAM
Anesthesia: Moderate Sedation | Laterality: Left

## 2015-03-08 MED ORDER — FENTANYL CITRATE (PF) 100 MCG/2ML IJ SOLN
INTRAMUSCULAR | Status: AC
Start: 2015-03-08 — End: 2015-03-08
  Filled 2015-03-08: qty 2

## 2015-03-08 MED ORDER — LIDOCAINE-EPINEPHRINE (PF) 1 %-1:200000 IJ SOLN
INTRAMUSCULAR | Status: AC
  Filled 2015-03-08: qty 30

## 2015-03-08 MED ORDER — MIDAZOLAM HCL 2 MG/2ML IJ SOLN
INTRAMUSCULAR | Status: DC | PRN
  Administered 2015-03-08: 2 mg via INTRAVENOUS
  Administered 2015-03-08: 1 mg via INTRAVENOUS

## 2015-03-08 MED ORDER — CLINDAMYCIN PHOSPHATE 300 MG/50ML IV SOLN: 300.0000 mg | Freq: Once | INTRAVENOUS | Status: DC

## 2015-03-08 MED ORDER — MIDAZOLAM HCL 5 MG/5ML IJ SOLN
INTRAMUSCULAR | Status: AC
  Filled 2015-03-08: qty 5

## 2015-03-08 MED ORDER — SODIUM CHLORIDE 0.9 % IV SOLN: INTRAVENOUS | Status: DC

## 2015-03-08 MED ORDER — HEPARIN SODIUM (PORCINE) 1000 UNIT/ML IJ SOLN
INTRAMUSCULAR | Status: DC | PRN
  Administered 2015-03-08: 3000 [IU] via INTRAVENOUS

## 2015-03-08 MED ORDER — FENTANYL CITRATE (PF) 100 MCG/2ML IJ SOLN
INTRAMUSCULAR | Status: DC | PRN
  Administered 2015-03-08 (×2): 50 ug via INTRAVENOUS

## 2015-03-08 MED ORDER — HEPARIN (PORCINE) IN NACL 2-0.9 UNIT/ML-% IJ SOLN
INTRAMUSCULAR | Status: AC
Start: 2015-03-08 — End: 2015-03-08
  Filled 2015-03-08: qty 1000

## 2015-03-08 MED ORDER — CLINDAMYCIN PHOSPHATE 300 MG/50ML IV SOLN
INTRAVENOUS | Status: AC
  Administered 2015-03-08: 10:00:00
  Filled 2015-03-08: qty 50

## 2015-03-08 SURGICAL SUPPLY — 9 items
BALLN LUTONIX DCB 7X60X130 (BALLOONS) ×2
BALLOON LUTONIX DCB 7X60X130 (BALLOONS) IMPLANT
DEVICE PRESTO INFLATION (MISCELLANEOUS) ×1 IMPLANT
DRAPE BRACHIAL (DRAPES) ×1 IMPLANT
PACK ANGIOGRAPHY (CUSTOM PROCEDURE TRAY) ×1 IMPLANT
SHEATH BRITE TIP 6FRX5.5 (SHEATH) ×2 IMPLANT
SUT MNCRL AB 4-0 PS2 18 (SUTURE) ×1 IMPLANT
TOWEL OR 17X26 4PK STRL BLUE (TOWEL DISPOSABLE) ×1 IMPLANT
WIRE MAGIC TORQUE 260C (WIRE) ×1 IMPLANT

## 2015-03-08 NOTE — Discharge Instructions (Signed)
Fistulogram, Care After °Refer to this sheet in the next few weeks. These instructions provide you with information on caring for yourself after your procedure. Your health care provider may also give you more specific instructions. Your treatment has been planned according to current medical practices, but problems sometimes occur. Call your health care provider if you have any problems or questions after your procedure. °WHAT TO EXPECT AFTER THE PROCEDURE °After your procedure, it is typical to have the following: °· A small amount of discomfort in the area where the catheters were placed. °· A small amount of bruising around the fistula. °· Sleepiness and fatigue. °HOME CARE INSTRUCTIONS °· Rest at home for the day following your procedure. °· Do not drive or operate heavy machinery while taking pain medicine. °· Take medicines only as directed by your health care provider. °· Do not take baths, swim, or use a hot tub until your health care provider approves. You may shower 24 hours after the procedure or as directed by your health care provider. °· There are many different ways to close and cover an incision, including stitches, skin glue, and adhesive strips. Follow your health care provider's instructions on: °¨ Incision care. °¨ Bandage (dressing) changes and removal. °¨ Incision closure removal. °· Monitor your dialysis fistula carefully. °SEEK MEDICAL CARE IF: °· You have drainage, redness, swelling, or pain at your catheter site. °· You have a fever. °· You have chills. °SEEK IMMEDIATE MEDICAL CARE IF: °· You feel weak. °· You have trouble balancing. °· You have trouble moving your arms or legs. °· You have problems with your speech or vision. °· You can no longer feel a vibration or buzz when you put your fingers over your dialysis fistula. °· The limb that was used for the procedure: °¨ Swells. °¨ Is painful. °¨ Is cold. °¨ Is discolored, such as blue or pale white. °  °This information is not intended  to replace advice given to you by your health care provider. Make sure you discuss any questions you have with your health care provider. °  °Document Released: 10/03/2013 Document Reviewed: 10/03/2013 °Elsevier Interactive Patient Education ©2016 [employer] ° °

## 2015-03-08 NOTE — H&P (Cosign Needed)
[facility] Admission History & Physical  MRN : [id number]  [name] is a 74 y.o. (26-Apr-1941) female who presents with chief complaint of No chief complaint on file. Marland Kitchen  History of Present Illness: Patient sent from dialysis center for decreased flow in her dialysis access. She otherwise is feeling well and is in her usual state of health.  Current Facility-Administered Medications  Medication Dose Route Frequency Provider Last Rate Last Dose  . 0.9 %  sodium chloride infusion   Intravenous Continuous Kimberly A Stegmayer, PA-C      . clindamycin (CLEOCIN) 300 MG/50ML IVPB           . clindamycin (CLEOCIN) IVPB 300 mg  300 mg Intravenous Once Sela Hua, PA-C        Past Medical History  Diagnosis Date  . Hyperlipidemia   . Normal cardiac stress test 12/24/2009    lexiscan, imaging normal  . 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 Diverticulitis   . IBS (irritable bowel syndrome)   . Thyroid disease   . Depression   . Chronic kidney disease     Hemo MWF  . GERD (gastroesophageal reflux disease)   . H/O hiatal hernia   . Arthritis   . RLS (restless legs syndrome)   . Tubular adenoma of colon 01/2008  . Seizures ([place])     2004  . Constipation   . Sinus complaint   . Dialysis patient [facility])     kidney  . Renal disorder   . Adrenal mass ([place])   . Hypertension   . High cholesterol   . Back pain   . Meralgia paresthetica of right side 12/26/2014    Past Surgical History  Procedure Laterality Date  . Cardiac catheterization  2003    normal  . Cholecystectomy      Open mid line incision  . Frontal craniotomy  2002    indication = sinusitis  . Appendectomy    . Tubal ligation    . Abdominal hysterectomy    . Insertion of dialysis catheter      Left  . Revison of arteriovenous fistula  05/11/2012    Procedure: REVISON OF ARTERIOVENOUS FISTULA;  Surgeon: Elam Dutch, MD;  Location: [place];   Service: Vascular;  Laterality: Right;  . Av fistula placement Left 11/11/2012    Procedure: INSERTION OF ARTERIOVENOUS (AV) GORE-TEX GRAFT ARM;  Surgeon: Angelia Mould, MD;  Location: [place];  Service: Vascular;  Laterality: Left;  . Avgg removal Left 11/18/2012    Procedure: REMOVAL OF LEFT UPPER ARM ARTERIOVENOUS GORETEX GRAFT ([place]);  Surgeon: Angelia Mould, MD;  Location: [place];  Service: Vascular;  Laterality: Left;  . Patch angioplasty Left 11/18/2012    Procedure: PATCH ANGIOPLASTY;  Surgeon: Angelia Mould, MD;  Location: [place];  Service: Vascular;  Laterality: Left;  . Frontal cranialcity  2002    Social History Social History  Substance Use Topics  . Smoking status: Current Every Day Smoker -- 1.00 packs/day for 60 years    Types: Cigarettes    Start date: 06/16/2014  . Smokeless tobacco: Never Used     Comment: pt states she smokes "1" cig per day  . Alcohol Use: No    Family History Family History  Problem Relation Age of Onset  . Thyroid cancer Mother   . Heart disease Father   . Hypertension Father   .  Heart attack Father   . Breast cancer Sister   . Lupus Daughter     Allergies  Allergen Reactions  . Codeine Nausea And Vomiting  . Tuberculin Tests Hives    "blisters"  . Penicillins Rash    No problems breathing. Has tolerated omnicef in past without issue  . Sulfamethoxazole Rash     REVIEW OF SYSTEMS (Negative unless checked)  Constitutional: [] Weight loss  [] Fever  [] Chills Cardiac: [] Chest pain   [] Chest pressure   [] Palpitations   [] Shortness of breath when laying flat   [] Shortness of breath at rest   [] Shortness of breath with exertion. Vascular:  [] Pain in legs with walking   [] Pain in legs at rest   [] Pain in legs when laying flat   [] Claudication   [] Pain in feet when walking  [] Pain in feet at rest  [] Pain in feet when laying flat   [] History of DVT   [] Phlebitis   [] Swelling in legs   [] Varicose veins   [] Non-healing  ulcers Pulmonary:   [] Uses home oxygen   [] Productive cough   [] Hemoptysis   [] Wheeze  [] COPD   [] Asthma Neurologic:  [] Dizziness  [] Blackouts   [] Seizures   [] History of stroke   [] History of TIA  [] Aphasia   [] Temporary blindness   [] Dysphagia   [] Weakness or numbness in arms   [] Weakness or numbness in legs Musculoskeletal:  [] Arthritis   [] Joint swelling   [] Joint pain   [] Low back pain Hematologic:  [] Easy bruising  [] Easy bleeding   [] Hypercoagulable state   [] Anemic  [] Hepatitis Gastrointestinal:  [] Blood in stool   [] Vomiting blood  [] Gastroesophageal reflux/heartburn   [] Difficulty swallowing. Genitourinary:  [x] Chronic kidney disease   [] Difficult urination  [] Frequent urination  [] Burning with urination   [] Blood in urine Skin:  [] Rashes   [] Ulcers   [] Wounds Psychological:  [] History of anxiety   []  History of major depression.  Physical Examination  Filed Vitals:   03/08/15 0842  BP: 159/66  Pulse: 77  Temp: 98.1 F (36.7 C)  TempSrc: Oral  Resp: 15  Height: 5\' 1"  (1.549 m)  Weight: 84.369 kg (186 lb)  SpO2: 100%   Body mass index is 35.16 kg/(m^2). Gen: WD/WN, NAD Head: [place]/AT, No temporalis wasting. Prominent temp pulse not noted. Ear/Nose/Throat: Hearing grossly intact, nares w/o erythema or drainage, oropharynx w/o Erythema/Exudate,  Eyes: PERRLA, EOMI.  Neck: Supple, no nuchal rigidity.  No bruit or JVD.  Pulmonary:  Good air movement, clear to auscultation bilaterally, no use of accessory muscles.  Cardiac: RRR, normal S1, S2, no Murmurs, rubs or gallops. Vascular: thrill and bruit present in HeRO graft Vessel Right Left  Radial Palpable Palpable                                   Gastrointestinal: soft, non-tender/non-distended. No guarding/reflex.  Musculoskeletal: M/S 5/5 throughout.  Extremities without ischemic changes.  No deformity or atrophy.  Neurologic: CN 2-12 intact. Pain and light touch intact in extremities.  Symmetrical.  Speech is fluent.  Motor exam as listed above. Psychiatric: Judgment intact, Mood & affect appropriate for pt's clinical situation. Dermatologic: No rashes or ulcers noted.  No cellulitis or open wounds. Lymph : No Cervical, Axillary, or Inguinal lymphadenopathy.      CBC Lab Results  Component Value Date   WBC 9.0 01/01/2015   HGB 9.9* 01/01/2015   HCT 30.4* 01/01/2015   MCV 89.7 01/01/2015  PLT 199 01/01/2015    BMET    Component Value Date/Time   NA 135 01/01/2015 1454   NA 136 07/07/2013 1334   K 3.5 01/01/2015 1454   K 4.0 07/07/2013 1334   CL 96* 01/01/2015 1454   CL 100 07/07/2013 1334   CO2 29 01/01/2015 1454   CO2 29 07/07/2013 1334   GLUCOSE 137* 01/01/2015 1454   GLUCOSE 75 07/07/2013 1334   BUN 21* 01/01/2015 1454   BUN 30* 07/07/2013 1334   CREATININE 5.39* 01/01/2015 1454   CREATININE 7.07* 07/07/2013 1334   CREATININE 4.92* 03/11/2012 1218   CREATININE 3.19* 05/06/2010 1349   CALCIUM 9.1 01/01/2015 1454   CALCIUM 10.0 07/07/2013 1334   CALCIUM 8.4 05/06/2010 0515   GFRNONAA 7* 01/01/2015 1454   GFRNONAA 5* 07/07/2013 1334   GFRAA 8* 01/01/2015 1454   GFRAA 6* 07/07/2013 1334   CrCl cannot be calculated (Patient has no serum creatinine result on file.).  COAG Lab Results  Component Value Date   INR 1.14 11/14/2013   INR 1.10 04/14/2012   INR 1.14 05/06/2010    Radiology No results found.    Assessment/Plan 1. Dysfunction of dialysis access.  For shuntogram today 2. ESRD. Stable. Try to get graft working better 3. HTN. stable   Omar Orrego, MD  03/08/2015 10:14 AM

## 2015-03-08 NOTE — H&P (Signed)
  [facility] History & Physical Update  The patient was interviewed and re-examined.  The patient's previous History and Physical has been reviewed and is unchanged.  There is no change in the plan of care. We plan to proceed with the scheduled procedure.  DEW,JASON, MD  03/08/2015, 9:49 AM

## 2015-03-08 NOTE — Op Note (Signed)
[facility]    OPERATIVE NOTE   PROCEDURE: 1.  Left arm HeRO graft cannulation under ultrasound guidance 2.  Left arm shuntogram 3.  Percutaneous transluminal angioplasty of mid graft with 7 mm x 6 cm Lutonix drug coated angioplasty balloon inflated twice  PRE-OPERATIVE DIAGNOSIS: 1. ESRD 2. Malfunctioning left arm HeRO graft  POST-OPERATIVE DIAGNOSIS: same as above   SURGEON: Leotis Pain, MD  ANESTHESIA: local with MCS  ESTIMATED BLOOD LOSS: minimal  FINDING(S): 1. Diffuse narrowing of >70% mid graft  SPECIMEN(S):  None  CONTRAST: 25 cc  INDICATIONS: [name] is a 74 y.o. female who presents with malfunctioning left arm HeRO graft.  The patient is scheduled for left arm shuntogram.  The patient is aware the risks include but are not limited to: bleeding, infection, thrombosis of the cannulated access, and possible anaphylactic reaction to the contrast.  The patient is aware of the risks of the procedure and elects to proceed forward.  DESCRIPTION: After full informed written consent was obtained, the patient was brought back to the angiography suite and placed supine upon the angiography table.  The patient was connected to monitoring equipment.  The left arm was prepped and draped in the standard fashion for a percutaneous access intervention.  Under ultrasound guidance, the left arm HeRO graft was cannulated with a micropuncture needle under direct ultrasound guidance and a permanent image was performed.  The microwire was advanced into the fistula and the needle was exchanged for the a microsheath.  I then upsized to a 6 Fr Sheath and imaging was performed.  Hand injections were completed to image the access including the central venous system. This demonstrated diffuse narrowing in the mid graft at the access sites with >70% stenosis.  Based on the images, this patient will need intervention to the mid graft to improve function and prevent thrombosis.  I then gave the patient 3000 units of intravenous heparin.  I then crossed the stenosis with a Magic Tourqe wire.  Based on the imaging, a 7 mm x 6 cm  Lutonix drug coated angioplasty balloon was selected.  The balloon was centered around the stenosis and inflated to 14 ATM for 1 minute(s).  After balloon deflation, it was then pulled back several cm to treat the rest of the narrowing.  Again, it was inflated to 14 ATM for one minute. On completion imaging, a <10% residual stenosis was present.     Based on the completion imaging, no further intervention is necessary.  The wire and balloon were removed from the sheath.  A 4-0 Monocryl purse-string suture was sewn around the sheath.  The sheath was removed while tying down the suture.  A sterile bandage was applied to the puncture site.  COMPLICATIONS: None  CONDITION: Stable   DEW,JASON  03/08/2015 10:35 AM

## 2015-03-08 NOTE — Progress Notes (Signed)
Pt in procedure pre, attached to monitor, etco2 monitor on, will monitor continuouisly during procedure, vasc. Tech to monitor documented vitals,

## 2015-03-09 ENCOUNTER — Encounter: Payer: Self-pay | Admitting: Vascular Surgery

## 2015-03-09 DIAGNOSIS — N186 End stage renal disease: Secondary | ICD-10-CM | POA: Diagnosis not present

## 2015-03-09 DIAGNOSIS — D631 Anemia in chronic kidney disease: Secondary | ICD-10-CM | POA: Diagnosis not present

## 2015-03-09 DIAGNOSIS — N2581 Secondary hyperparathyroidism of renal origin: Secondary | ICD-10-CM | POA: Diagnosis not present

## 2015-03-13 DIAGNOSIS — N2581 Secondary hyperparathyroidism of renal origin: Secondary | ICD-10-CM | POA: Diagnosis not present

## 2015-03-13 DIAGNOSIS — D631 Anemia in chronic kidney disease: Secondary | ICD-10-CM | POA: Diagnosis not present

## 2015-03-13 DIAGNOSIS — N186 End stage renal disease: Secondary | ICD-10-CM | POA: Diagnosis not present

## 2015-03-15 DIAGNOSIS — D631 Anemia in chronic kidney disease: Secondary | ICD-10-CM | POA: Diagnosis not present

## 2015-03-15 DIAGNOSIS — N186 End stage renal disease: Secondary | ICD-10-CM | POA: Diagnosis not present

## 2015-03-15 DIAGNOSIS — N2581 Secondary hyperparathyroidism of renal origin: Secondary | ICD-10-CM | POA: Diagnosis not present

## 2015-03-16 DIAGNOSIS — N2581 Secondary hyperparathyroidism of renal origin: Secondary | ICD-10-CM | POA: Diagnosis not present

## 2015-03-16 DIAGNOSIS — N186 End stage renal disease: Secondary | ICD-10-CM | POA: Diagnosis not present

## 2015-03-16 DIAGNOSIS — D631 Anemia in chronic kidney disease: Secondary | ICD-10-CM | POA: Diagnosis not present

## 2015-03-19 DIAGNOSIS — N186 End stage renal disease: Secondary | ICD-10-CM | POA: Diagnosis not present

## 2015-03-19 DIAGNOSIS — N2581 Secondary hyperparathyroidism of renal origin: Secondary | ICD-10-CM | POA: Diagnosis not present

## 2015-03-19 DIAGNOSIS — D631 Anemia in chronic kidney disease: Secondary | ICD-10-CM | POA: Diagnosis not present

## 2015-03-21 DIAGNOSIS — N2581 Secondary hyperparathyroidism of renal origin: Secondary | ICD-10-CM | POA: Diagnosis not present

## 2015-03-21 DIAGNOSIS — D631 Anemia in chronic kidney disease: Secondary | ICD-10-CM | POA: Diagnosis not present

## 2015-03-21 DIAGNOSIS — N186 End stage renal disease: Secondary | ICD-10-CM | POA: Diagnosis not present

## 2015-03-23 DIAGNOSIS — N186 End stage renal disease: Secondary | ICD-10-CM | POA: Diagnosis not present

## 2015-03-23 DIAGNOSIS — N2581 Secondary hyperparathyroidism of renal origin: Secondary | ICD-10-CM | POA: Diagnosis not present

## 2015-03-23 DIAGNOSIS — D631 Anemia in chronic kidney disease: Secondary | ICD-10-CM | POA: Diagnosis not present

## 2015-03-26 ENCOUNTER — Ambulatory Visit: Payer: Medicare Other | Admitting: Neurology

## 2015-03-26 DIAGNOSIS — N2581 Secondary hyperparathyroidism of renal origin: Secondary | ICD-10-CM | POA: Diagnosis not present

## 2015-03-26 DIAGNOSIS — N186 End stage renal disease: Secondary | ICD-10-CM | POA: Diagnosis not present

## 2015-03-26 DIAGNOSIS — D631 Anemia in chronic kidney disease: Secondary | ICD-10-CM | POA: Diagnosis not present

## 2015-03-28 ENCOUNTER — Ambulatory Visit (INDEPENDENT_AMBULATORY_CARE_PROVIDER_SITE_OTHER): Payer: Medicare Other | Admitting: Neurology

## 2015-03-28 ENCOUNTER — Encounter: Payer: Self-pay | Admitting: Neurology

## 2015-03-28 VITALS — BP 187/80 | HR 84 | Ht 61.0 in | Wt 180.0 lb

## 2015-03-28 DIAGNOSIS — R269 Unspecified abnormalities of gait and mobility: Secondary | ICD-10-CM | POA: Diagnosis not present

## 2015-03-28 DIAGNOSIS — G609 Hereditary and idiopathic neuropathy, unspecified: Secondary | ICD-10-CM | POA: Diagnosis not present

## 2015-03-28 DIAGNOSIS — R2681 Unsteadiness on feet: Secondary | ICD-10-CM | POA: Insufficient documentation

## 2015-03-28 DIAGNOSIS — N2581 Secondary hyperparathyroidism of renal origin: Secondary | ICD-10-CM | POA: Diagnosis not present

## 2015-03-28 DIAGNOSIS — N186 End stage renal disease: Secondary | ICD-10-CM | POA: Diagnosis not present

## 2015-03-28 DIAGNOSIS — D631 Anemia in chronic kidney disease: Secondary | ICD-10-CM | POA: Diagnosis not present

## 2015-03-28 HISTORY — DX: Unspecified abnormalities of gait and mobility: R26.9

## 2015-03-28 NOTE — Progress Notes (Signed)
Reason for visit: Gait disorder  [name] is an 74 y.o. female  History of present illness:  [name] is a 74 year old right-handed black female with a history of a chronic gait disorder. The patient has undergone nerve conduction studies that showed some dysfunction within the right peroneal nerve distribution, but overall she did not have a peripheral neuropathy. The patient could potentially have a small fiber neuropathy as she reports some burning and tingling sensations in the feet. She also has a right meralgia paresthetica and was treated with carbamazepine for this with good improvement, she has come off of this medication. She does have arthritis in the low back, and hips, right greater than left. She will note some sensation of fatigue and some discomfort in the legs with walking, she can walk only about 50 feet before she has to stop and rest. She has end-stage renal disease, on hemodialysis. She has not had any falls, but she does believe that her balance is slightly impaired. She denies shortness of breath with walking, she does have occasional muscle cramps that occur during hemodialysis, not at other times. She returns to this office for an evaluation. MRI evaluations of the low back and thoracic spine did not show significant spinal stenosis.  Past Medical History  Diagnosis Date  . Hyperlipidemia   . Normal cardiac stress test 12/24/2009    lexiscan, imaging normal  . 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 Diverticulitis   . IBS (irritable bowel syndrome)   . Thyroid disease   . Depression   . Chronic kidney disease     Hemo MWF  . GERD (gastroesophageal reflux disease)   . H/O hiatal hernia   . Arthritis   . RLS (restless legs syndrome)   . Tubular adenoma of colon 01/2008  . Seizures ([place])     2004  . Constipation   . Sinus complaint   . Dialysis patient [facility])     kidney  . Renal disorder   . Adrenal mass ([place])   .  Hypertension   . High cholesterol   . Back pain   . Meralgia paresthetica of right side 12/26/2014  . Abnormality of gait 03/28/2015    Past Surgical History  Procedure Laterality Date  . Cardiac catheterization  2003    normal  . Cholecystectomy      Open mid line incision  . Frontal craniotomy  2002    indication = sinusitis  . Appendectomy    . Tubal ligation    . Abdominal hysterectomy    . Insertion of dialysis catheter      Left  . Revison of arteriovenous fistula  05/11/2012    Procedure: REVISON OF ARTERIOVENOUS FISTULA;  Surgeon: Elam Dutch, MD;  Location: [place];  Service: Vascular;  Laterality: Right;  . Av fistula placement Left 11/11/2012    Procedure: INSERTION OF ARTERIOVENOUS (AV) GORE-TEX GRAFT ARM;  Surgeon: Angelia Mould, MD;  Location: [place];  Service: Vascular;  Laterality: Left;  . Avgg removal Left 11/18/2012    Procedure: REMOVAL OF LEFT UPPER ARM ARTERIOVENOUS GORETEX GRAFT ([place]);  Surgeon: Angelia Mould, MD;  Location: [place];  Service: Vascular;  Laterality: Left;  . Patch angioplasty Left 11/18/2012    Procedure: PATCH ANGIOPLASTY;  Surgeon: Angelia Mould, MD;  Location: [place];  Service: Vascular;  Laterality: Left;  . Frontal cranialcity  2002  .  Peripheral vascular catheterization Left 03/08/2015    Procedure: A/V Shuntogram/Fistulagram;  Surgeon: Algernon Huxley, MD;  Location: [facility] [facility];  Service: Cardiovascular;  Laterality: Left;  . Peripheral vascular catheterization Left 03/08/2015    Procedure: A/V Shunt Intervention;  Surgeon: Algernon Huxley, MD;  Location: [place] [facility];  Service: Cardiovascular;  Laterality: Left;    Family History  Problem Relation Age of Onset  . Thyroid cancer Mother   . Heart disease Father   . Hypertension Father   . Heart attack Father   . Breast cancer Sister   . Lupus Daughter     Social history:  reports that she has been smoking Cigarettes.  She started smoking about 9  months ago. She has a 60 pack-year smoking history. She has never used smokeless tobacco. She reports that she does not drink alcohol or use illicit drugs.    Allergies  Allergen Reactions  . Codeine Nausea And Vomiting  . Tuberculin Tests Hives    "blisters"  . Penicillins Rash    No problems breathing. Has tolerated omnicef in past without issue  . Sulfamethoxazole Rash    Medications:  Prior to Admission medications   Medication Sig Start Date End Date Taking? Authorizing Provider  ABILIFY 2 MG tablet Take 0.5 tablets (1 mg total) by mouth daily. 11/23/14  Yes Coral Spikes, DO  allopurinol (ZYLOPRIM) 100 MG tablet TAKE ONE TABLET BY MOUTH ONCE DAILY ON  THE  DAYS  FOLLOWING  DIALYSIS  (TUESDAY,  THURSDAY  AND  SATURDAYS) 06/27/14  Yes Bryan R Hess, DO  amLODipine (NORVASC) 10 MG tablet TAKE ONE TABLET BY MOUTH ONCE DAILY 09/25/14  Yes Coral Spikes, DO  aspirin EC 81 MG tablet Take 81 mg by mouth every morning.   Yes Historical Provider, MD  atorvastatin (LIPITOR) 40 MG tablet Take 1 tablet (40 mg total) by mouth daily. 02/26/15  Yes Traver N Rumley, DO  cinacalcet (SENSIPAR) 30 MG tablet Take 30 mg by mouth at bedtime.   Yes Historical Provider, MD  cloNIDine (CATAPRES) 0.1 MG tablet Take 0.1-0.2 mg by mouth 2 (two) times daily. Take 1 tablet in the morning and 2 tablets in the evening   Yes Historical Provider, MD  fluticasone (FLONASE) 50 MCG/ACT nasal spray Place 2 sprays into both nostrils daily as needed for allergies or rhinitis.    Yes Historical Provider, MD  folic acid-vitamin b complex-vitamin c-selenium-zinc (DIALYVITE) 3 MG TABS tablet Take 1 tablet by mouth daily.   Yes Historical Provider, MD  lactulose (CHRONULAC) 10 GM/15ML solution Take 15 mLs (10 g total) by mouth daily. 01/04/15  Yes Mariel Aloe, MD  lidocaine-prilocaine (EMLA) cream Apply 1 application topically 3 (three) times a week. Use before dialysis on MWF   Yes Historical Provider, MD  loratadine (CLARITIN) 10  MG tablet Take 1 tablet (10 mg total) by mouth daily as needed for allergies. 09/27/14  Yes Coral Spikes, DO  losartan (COZAAR) 100 MG tablet Take 1 tablet (100 mg total) by mouth daily. 09/27/14  Yes Coral Spikes, DO  metoprolol succinate (TOPROL-XL) 100 MG 24 hr tablet Take 100 mg by mouth at bedtime. Take with or immediately following a meal.   Yes Historical Provider, MD  multivitamin (RENA-VIT) TABS tablet Take 1 tablet by mouth at bedtime. 01/01/15  Yes Alyssa A [place] Brigham, MD  omeprazole (PRILOSEC) 20 MG capsule Take 20 mg by mouth 2 (two) times daily.   Yes Historical Provider,  MD  pregabalin (LYRICA) 75 MG capsule Take 75 mg by mouth every evening.    Yes Historical Provider, MD  senna (SENOKOT) 8.6 MG tablet Take 2 tablets by mouth daily as needed. For constipation   Yes Historical Provider, MD  sevelamer carbonate (RENVELA) 800 MG tablet Take 2,400 mg by mouth 3 (three) times daily with meals as needed (whenever food is consumed).    Yes Historical Provider, MD    ROS:  Out of a complete 14 system review of symptoms, the patient complains only of the following symptoms, and all other reviewed systems are negative.  Back pain, walking difficulty  Blood pressure 187/80, pulse 84, height 5\' 1"  (1.549 m), weight 180 lb (81.647 kg).  Physical Exam  General: The patient is alert and cooperative at the time of the examination. The patient is moderately obese.  Skin: No significant peripheral edema is noted.   Neurologic Exam  Mental status: The patient is alert and oriented x 3 at the time of the examination. The patient has apparent normal recent and remote memory, with an apparently normal attention span and concentration ability.   Cranial nerves: Facial symmetry is present. Speech is normal, no aphasia or dysarthria is noted. Extraocular movements are full. Visual fields are full.  Motor: The patient has good strength in all 4 extremities.  Sensory examination: Soft touch sensation  is symmetric on the face, arms, and legs. There is a stocking pattern pinprick sensory deficit in the distal half or two thirds of the legs bilaterally.  Coordination: The patient has good finger-nose-finger and heel-to-shin bilaterally.  Gait and station: The patient has a slightly wide-based gait, the patient can ambulate independently. Romberg is negative. No drift is seen. The patient is able to stand with the arms crossed from a seated position.  Reflexes: Deep tendon reflexes are symmetric, but are depressed.   Assessment/Plan:  1. Gait disorder  2. Right meralgia paresthetica  3. Degenerative arthritis  4. End-stage renal disease on hemodialysis  This patient appears to have some limitation of walking with a sensation of fatigue, which also has some discomfort in the back and right hip in particular. The patient has no clear evidence of a radiculopathy, myopathy, or significant peripheral neuropathy by EMG that explains her walking issues. The patient does have some discomfort with movement across the hips and knees bilaterally. The patient will be sent for physical therapy for leg strengthening exercises to see if this helps. The patient will follow-up in 4 months for an evaluation.  Jill Alexanders MD 03/28/2015 7:01 PM  [facility] [address] [place], [place] [address]  Phone [phone] Fax [phone]

## 2015-03-28 NOTE — Patient Instructions (Signed)
We will get PT for the walking, working on strengthening the legs.   Fall Prevention in the Home  Falls can cause injuries and can affect people from all age groups. There are many simple things that you can do to make your home safe and to help prevent falls. WHAT CAN I DO ON THE OUTSIDE OF MY HOME?  Regularly repair the edges of walkways and driveways and fix any cracks.  Remove high doorway thresholds.  Trim any shrubbery on the main path into your home.  Use bright outdoor lighting.  Clear walkways of debris and clutter, including tools and rocks.  Regularly check that handrails are securely fastened and in good repair. Both sides of any steps should have handrails.  Install guardrails along the edges of any raised decks or porches.  Have leaves, snow, and ice cleared regularly.  Use sand or salt on walkways during winter months.  In the garage, clean up any spills right away, including grease or oil spills. WHAT CAN I DO IN THE BATHROOM?  Use night lights.  Install grab bars by the toilet and in the tub and shower. Do not use towel bars as grab bars.  Use non-skid mats or decals on the floor of the tub or shower.  If you need to sit down while you are in the shower, use a plastic, non-slip stool.Marland Kitchen  Keep the floor dry. Immediately clean up any water that spills on the floor.  Remove soap buildup in the tub or shower on a regular basis.  Attach bath mats securely with double-sided non-slip rug tape.  Remove throw rugs and other tripping hazards from the floor. WHAT CAN I DO IN THE BEDROOM?  Use night lights.  Make sure that a bedside light is easy to reach.  Do not use oversized bedding that drapes onto the floor.  Have a firm chair that has side arms to use for getting dressed.  Remove throw rugs and other tripping hazards from the floor. WHAT CAN I DO IN THE KITCHEN?   Clean up any spills right away.  Avoid walking on wet floors.  Place frequently  used items in easy-to-reach places.  If you need to reach for something above you, use a sturdy step stool that has a grab bar.  Keep electrical cables out of the way.  Do not use floor polish or wax that makes floors slippery. If you have to use wax, make sure that it is non-skid floor wax.  Remove throw rugs and other tripping hazards from the floor. WHAT CAN I DO IN THE STAIRWAYS?  Do not leave any items on the stairs.  Make sure that there are handrails on both sides of the stairs. Fix handrails that are broken or loose. Make sure that handrails are as long as the stairways.  Check any carpeting to make sure that it is firmly attached to the stairs. Fix any carpet that is loose or worn.  Avoid having throw rugs at the top or bottom of stairways, or secure the rugs with carpet tape to prevent them from moving.  Make sure that you have a light switch at the top of the stairs and the bottom of the stairs. If you do not have them, have them installed. WHAT ARE SOME OTHER FALL PREVENTION TIPS?  Wear closed-toe shoes that fit well and support your feet. Wear shoes that have rubber soles or low heels.  When you use a stepladder, make sure that it is completely  opened and that the sides are firmly locked. Have someone hold the ladder while you are using it. Do not climb a closed stepladder.  Add color or contrast paint or tape to grab bars and handrails in your home. Place contrasting color strips on the first and last steps.  Use mobility aids as needed, such as canes, walkers, scooters, and crutches.  Turn on lights if it is dark. Replace any light bulbs that burn out.  Set up furniture so that there are clear paths. Keep the furniture in the same spot.  Fix any uneven floor surfaces.  Choose a carpet design that does not hide the edge of steps of a stairway.  Be aware of any and all pets.  Review your medicines with your healthcare provider. Some medicines can cause dizziness  or changes in blood pressure, which increase your risk of falling. Talk with your health care provider about other ways that you can decrease your risk of falls. This may include working with a physical therapist or trainer to improve your strength, balance, and endurance.   This information is not intended to replace advice given to you by your health care provider. Make sure you discuss any questions you have with your health care provider.   Document Released: 05/09/2002 Document Revised: 10/03/2014 Document Reviewed: 06/23/2014 Elsevier Interactive Patient Education [employer].

## 2015-03-29 NOTE — Telephone Encounter (Signed)
Error

## 2015-03-30 DIAGNOSIS — D631 Anemia in chronic kidney disease: Secondary | ICD-10-CM | POA: Diagnosis not present

## 2015-03-30 DIAGNOSIS — N2581 Secondary hyperparathyroidism of renal origin: Secondary | ICD-10-CM | POA: Diagnosis not present

## 2015-03-30 DIAGNOSIS — N186 End stage renal disease: Secondary | ICD-10-CM | POA: Diagnosis not present

## 2015-04-02 DIAGNOSIS — Z992 Dependence on renal dialysis: Secondary | ICD-10-CM | POA: Diagnosis not present

## 2015-04-02 DIAGNOSIS — E1129 Type 2 diabetes mellitus with other diabetic kidney complication: Secondary | ICD-10-CM | POA: Diagnosis not present

## 2015-04-02 DIAGNOSIS — N2581 Secondary hyperparathyroidism of renal origin: Secondary | ICD-10-CM | POA: Diagnosis not present

## 2015-04-02 DIAGNOSIS — D631 Anemia in chronic kidney disease: Secondary | ICD-10-CM | POA: Diagnosis not present

## 2015-04-02 DIAGNOSIS — N186 End stage renal disease: Secondary | ICD-10-CM | POA: Diagnosis not present

## 2015-04-04 DIAGNOSIS — D631 Anemia in chronic kidney disease: Secondary | ICD-10-CM | POA: Diagnosis not present

## 2015-04-04 DIAGNOSIS — N2581 Secondary hyperparathyroidism of renal origin: Secondary | ICD-10-CM | POA: Diagnosis not present

## 2015-04-04 DIAGNOSIS — N186 End stage renal disease: Secondary | ICD-10-CM | POA: Diagnosis not present

## 2015-04-05 ENCOUNTER — Ambulatory Visit (INDEPENDENT_AMBULATORY_CARE_PROVIDER_SITE_OTHER): Payer: Medicare Other | Admitting: Family Medicine

## 2015-04-05 VITALS — BP 154/53 | HR 67 | Temp 98.1°F

## 2015-04-05 DIAGNOSIS — L600 Ingrowing nail: Secondary | ICD-10-CM | POA: Diagnosis present

## 2015-04-05 NOTE — Patient Instructions (Signed)
Thank you so much for coming to visit me today! Please schedule an appointment for next week for a toe nail removal. You may use over the counter pain medication and warm soaks to help with the pain until the time of removal. Please continue to wash your other toe and use vaseline ointment.   Thanks again! Dr. Gerlean Ren

## 2015-04-06 DIAGNOSIS — D631 Anemia in chronic kidney disease: Secondary | ICD-10-CM | POA: Diagnosis not present

## 2015-04-06 DIAGNOSIS — N186 End stage renal disease: Secondary | ICD-10-CM | POA: Diagnosis not present

## 2015-04-06 DIAGNOSIS — N2581 Secondary hyperparathyroidism of renal origin: Secondary | ICD-10-CM | POA: Diagnosis not present

## 2015-04-08 DIAGNOSIS — L600 Ingrowing nail: Secondary | ICD-10-CM | POA: Insufficient documentation

## 2015-04-08 NOTE — Progress Notes (Signed)
Subjective:     Patient ID: [name], female   DOB: [birth year], 74 y.o.   MRN: [id number]  HPI [name] is a 74yo female presenting today for toe pain.   # Toe Pain: - History of removal of toe nail from right first digit on 02/15/15. Still healing. Denies fever or discharge. - Now with pain in first digit of left foot, worse on lateral aspect but present on medial aspect as well - Pain has been present for several weeks and is severe - Pain has resulted in her having to take some time off of work - Pain has woken her up at night and occasionally keeps her from sleeping - Pain improved with warm soaks - No further concerns today  Review of Systems Per HPI    Objective:   Physical Exam  Constitutional: She appears well-developed and well-nourished.  Cardiovascular: Normal rate and regular rhythm.  Exam reveals no gallop and no friction rub.   No murmur heard. Pulmonary/Chest: Effort normal. No respiratory distress. She has no wheezes. She has no rales.  Skin:  Onychocryptosis of first digit of left foot with tenderness along medial and lateral aspects (lateral worse than medial), no erythema or warmth noted. Dry skin with scabbing noted on first digit of right foot, no discharge noted, no erythema or warmth.       Assessment and Plan:     Onychocryptosis - Noted on first digit of left foot. Requests removal, however unable to stay for removal today due to another scheduled physicians appointment. Appointment scheduled for 04/10/15 with Dr. Lamar Benes for another evaluation and possible removal. - Recommend warm soaks and NSAIDs for pain - Follow up as scheduled.

## 2015-04-08 NOTE — Assessment & Plan Note (Signed)
-   Noted on first digit of left foot. Requests removal, however unable to stay for removal today due to another scheduled physicians appointment. Appointment scheduled for 04/10/15 with Dr. Lamar Benes for another evaluation and possible removal. - Recommend warm soaks and NSAIDs for pain - Follow up as scheduled.

## 2015-04-09 DIAGNOSIS — N186 End stage renal disease: Secondary | ICD-10-CM | POA: Diagnosis not present

## 2015-04-09 DIAGNOSIS — D631 Anemia in chronic kidney disease: Secondary | ICD-10-CM | POA: Diagnosis not present

## 2015-04-09 DIAGNOSIS — N2581 Secondary hyperparathyroidism of renal origin: Secondary | ICD-10-CM | POA: Diagnosis not present

## 2015-04-10 ENCOUNTER — Ambulatory Visit (INDEPENDENT_AMBULATORY_CARE_PROVIDER_SITE_OTHER): Payer: Medicare Other | Admitting: Family Medicine

## 2015-04-10 ENCOUNTER — Encounter: Payer: Self-pay | Admitting: Family Medicine

## 2015-04-10 VITALS — BP 148/62 | HR 66 | Temp 98.1°F | Ht 61.0 in | Wt 180.0 lb

## 2015-04-10 DIAGNOSIS — L600 Ingrowing nail: Secondary | ICD-10-CM | POA: Diagnosis present

## 2015-04-10 MED ORDER — HYDROCODONE-ACETAMINOPHEN 5-325 MG PO TABS: 1.0000 | ORAL_TABLET | Freq: Four times a day (QID) | ORAL | Status: DC | PRN

## 2015-04-10 NOTE — Patient Instructions (Signed)
Keep the current bandage on for at least the next 24 hours.  You can use cool packs/cold compresses today to help with some of the swelling and pain.   Tomorrow it is okay to take the bandage off and rinse of the area with warm soapy water. Use the large bandaids for the next several days to keep it clean/covered. Use antibiotic ointment over the area before putting the bandaid on.  If you develop any swelling, pus, pain please call the clinic/come back to have it looked at.

## 2015-04-10 NOTE — Progress Notes (Signed)
   Subjective:    Patient ID: [name], female    DOB: 1940-06-28, 74 y.o.   MRN: [id number]  Patient presents today for LEFT great toenail removal.  PROCEDURE: Ingrown nail (bilateral), no infection.  Patient given informed consent, signed copy in the chart. Appropriate time out taken.  Large rubberband tightened with forceps used as tourniquet placed at base of the toe.  Area prepped and draped in usual sterile fashion. Digital block done using 2% lidocaine without epinephrine, 10 cc total.  Nail elevated using nail elevator, removed using hemostats and traction force. No complications.  Minimal bleeding.  Sterile bandage applied and post procedure instructions given.  Patient tolerated procedure well without complications.

## 2015-04-11 DIAGNOSIS — N2581 Secondary hyperparathyroidism of renal origin: Secondary | ICD-10-CM | POA: Diagnosis not present

## 2015-04-11 DIAGNOSIS — D631 Anemia in chronic kidney disease: Secondary | ICD-10-CM | POA: Diagnosis not present

## 2015-04-11 DIAGNOSIS — N186 End stage renal disease: Secondary | ICD-10-CM | POA: Diagnosis not present

## 2015-04-13 DIAGNOSIS — N186 End stage renal disease: Secondary | ICD-10-CM | POA: Diagnosis not present

## 2015-04-13 DIAGNOSIS — D631 Anemia in chronic kidney disease: Secondary | ICD-10-CM | POA: Diagnosis not present

## 2015-04-13 DIAGNOSIS — N2581 Secondary hyperparathyroidism of renal origin: Secondary | ICD-10-CM | POA: Diagnosis not present

## 2015-04-16 DIAGNOSIS — D631 Anemia in chronic kidney disease: Secondary | ICD-10-CM | POA: Diagnosis not present

## 2015-04-16 DIAGNOSIS — N186 End stage renal disease: Secondary | ICD-10-CM | POA: Diagnosis not present

## 2015-04-16 DIAGNOSIS — N2581 Secondary hyperparathyroidism of renal origin: Secondary | ICD-10-CM | POA: Diagnosis not present

## 2015-04-18 DIAGNOSIS — D631 Anemia in chronic kidney disease: Secondary | ICD-10-CM | POA: Diagnosis not present

## 2015-04-18 DIAGNOSIS — N186 End stage renal disease: Secondary | ICD-10-CM | POA: Diagnosis not present

## 2015-04-18 DIAGNOSIS — N2581 Secondary hyperparathyroidism of renal origin: Secondary | ICD-10-CM | POA: Diagnosis not present

## 2015-04-20 DIAGNOSIS — D631 Anemia in chronic kidney disease: Secondary | ICD-10-CM | POA: Diagnosis not present

## 2015-04-20 DIAGNOSIS — N186 End stage renal disease: Secondary | ICD-10-CM | POA: Diagnosis not present

## 2015-04-20 DIAGNOSIS — N2581 Secondary hyperparathyroidism of renal origin: Secondary | ICD-10-CM | POA: Diagnosis not present

## 2015-04-23 DIAGNOSIS — N186 End stage renal disease: Secondary | ICD-10-CM | POA: Diagnosis not present

## 2015-04-23 DIAGNOSIS — D631 Anemia in chronic kidney disease: Secondary | ICD-10-CM | POA: Diagnosis not present

## 2015-04-23 DIAGNOSIS — N2581 Secondary hyperparathyroidism of renal origin: Secondary | ICD-10-CM | POA: Diagnosis not present

## 2015-04-25 DIAGNOSIS — D631 Anemia in chronic kidney disease: Secondary | ICD-10-CM | POA: Diagnosis not present

## 2015-04-25 DIAGNOSIS — N186 End stage renal disease: Secondary | ICD-10-CM | POA: Diagnosis not present

## 2015-04-25 DIAGNOSIS — N2581 Secondary hyperparathyroidism of renal origin: Secondary | ICD-10-CM | POA: Diagnosis not present

## 2015-04-28 DIAGNOSIS — N186 End stage renal disease: Secondary | ICD-10-CM | POA: Diagnosis not present

## 2015-04-28 DIAGNOSIS — N2581 Secondary hyperparathyroidism of renal origin: Secondary | ICD-10-CM | POA: Diagnosis not present

## 2015-04-28 DIAGNOSIS — D631 Anemia in chronic kidney disease: Secondary | ICD-10-CM | POA: Diagnosis not present

## 2015-04-30 DIAGNOSIS — N186 End stage renal disease: Secondary | ICD-10-CM | POA: Diagnosis not present

## 2015-04-30 DIAGNOSIS — N2581 Secondary hyperparathyroidism of renal origin: Secondary | ICD-10-CM | POA: Diagnosis not present

## 2015-04-30 DIAGNOSIS — D631 Anemia in chronic kidney disease: Secondary | ICD-10-CM | POA: Diagnosis not present

## 2015-05-01 ENCOUNTER — Telehealth: Payer: Self-pay | Admitting: Neurology

## 2015-05-01 DIAGNOSIS — R159 Full incontinence of feces: Secondary | ICD-10-CM

## 2015-05-01 DIAGNOSIS — E538 Deficiency of other specified B group vitamins: Secondary | ICD-10-CM

## 2015-05-01 DIAGNOSIS — R202 Paresthesia of skin: Secondary | ICD-10-CM

## 2015-05-01 NOTE — Telephone Encounter (Signed)
Patient called to advise she has been having bowel incontinence, neuropathy and pain in lower back, states Dr. Marval Regal wonders if Dr. Jannifer Franklin can re-image her.

## 2015-05-01 NOTE — Telephone Encounter (Signed)
I called the patient. She c/o back pain for about a week. She states last night she had an episode of severe burning in her feet. She could not even put them on the floor. Today she has had 3 episodes of bowel incontinence. She called her nephrologist earlier who told her he felt like it may be related to her spine and he advised she call Dr. Jannifer Franklin to ask to be re-imaged. I advised that I would pass this message on to Dr. Jannifer Franklin.

## 2015-05-01 NOTE — Telephone Encounter (Signed)
I called the patient and I left a message. I will call back in the morning.

## 2015-05-02 DIAGNOSIS — N2581 Secondary hyperparathyroidism of renal origin: Secondary | ICD-10-CM | POA: Diagnosis not present

## 2015-05-02 DIAGNOSIS — Z992 Dependence on renal dialysis: Secondary | ICD-10-CM | POA: Diagnosis not present

## 2015-05-02 DIAGNOSIS — E1129 Type 2 diabetes mellitus with other diabetic kidney complication: Secondary | ICD-10-CM | POA: Diagnosis not present

## 2015-05-02 DIAGNOSIS — N186 End stage renal disease: Secondary | ICD-10-CM | POA: Diagnosis not present

## 2015-05-02 DIAGNOSIS — D631 Anemia in chronic kidney disease: Secondary | ICD-10-CM | POA: Diagnosis not present

## 2015-05-02 NOTE — Addendum Note (Signed)
Addended by: Margette Fast on: 05/02/2015 01:42 PM   Modules accepted: Orders

## 2015-05-02 NOTE — Telephone Encounter (Addendum)
I called the patient. The patient has had some issues with feeling the bowels and the bladder, and she has had some fecal incontinence. This has been an issue for greater than 6 months, gradually getting worse. The patient may have a small fiber neuropathy that may affect the autonomic nervous system as well. We will get further blood work, recheck MRI of the low back, prior study done 2 years ago did not show spinal stenosis. The patient has also had an increase in lower extremity discomfort.

## 2015-05-03 ENCOUNTER — Ambulatory Visit: Payer: Medicare Other

## 2015-05-04 DIAGNOSIS — D509 Iron deficiency anemia, unspecified: Secondary | ICD-10-CM | POA: Diagnosis not present

## 2015-05-04 DIAGNOSIS — D631 Anemia in chronic kidney disease: Secondary | ICD-10-CM | POA: Diagnosis not present

## 2015-05-04 DIAGNOSIS — N186 End stage renal disease: Secondary | ICD-10-CM | POA: Diagnosis not present

## 2015-05-04 DIAGNOSIS — N2581 Secondary hyperparathyroidism of renal origin: Secondary | ICD-10-CM | POA: Diagnosis not present

## 2015-05-08 DIAGNOSIS — N186 End stage renal disease: Secondary | ICD-10-CM | POA: Diagnosis not present

## 2015-05-08 DIAGNOSIS — N2581 Secondary hyperparathyroidism of renal origin: Secondary | ICD-10-CM | POA: Diagnosis not present

## 2015-05-08 DIAGNOSIS — D509 Iron deficiency anemia, unspecified: Secondary | ICD-10-CM | POA: Diagnosis not present

## 2015-05-08 DIAGNOSIS — D631 Anemia in chronic kidney disease: Secondary | ICD-10-CM | POA: Diagnosis not present

## 2015-05-09 DIAGNOSIS — D509 Iron deficiency anemia, unspecified: Secondary | ICD-10-CM | POA: Diagnosis not present

## 2015-05-09 DIAGNOSIS — N186 End stage renal disease: Secondary | ICD-10-CM | POA: Diagnosis not present

## 2015-05-09 DIAGNOSIS — N2581 Secondary hyperparathyroidism of renal origin: Secondary | ICD-10-CM | POA: Diagnosis not present

## 2015-05-09 DIAGNOSIS — D631 Anemia in chronic kidney disease: Secondary | ICD-10-CM | POA: Diagnosis not present

## 2015-05-11 DIAGNOSIS — N2581 Secondary hyperparathyroidism of renal origin: Secondary | ICD-10-CM | POA: Diagnosis not present

## 2015-05-11 DIAGNOSIS — N186 End stage renal disease: Secondary | ICD-10-CM | POA: Diagnosis not present

## 2015-05-11 DIAGNOSIS — D631 Anemia in chronic kidney disease: Secondary | ICD-10-CM | POA: Diagnosis not present

## 2015-05-11 DIAGNOSIS — D509 Iron deficiency anemia, unspecified: Secondary | ICD-10-CM | POA: Diagnosis not present

## 2015-05-12 ENCOUNTER — Ambulatory Visit
Admission: RE | Admit: 2015-05-12 | Discharge: 2015-05-12 | Disposition: A | Discharge status: Home / Self Care | Payer: Medicare Other | Source: Ambulatory Visit | Attending: Neurology | Admitting: Neurology

## 2015-05-12 DIAGNOSIS — R159 Full incontinence of feces: Secondary | ICD-10-CM | POA: Diagnosis not present

## 2015-05-12 DIAGNOSIS — M47812 Spondylosis without myelopathy or radiculopathy, cervical region: Secondary | ICD-10-CM | POA: Diagnosis not present

## 2015-05-14 DIAGNOSIS — N186 End stage renal disease: Secondary | ICD-10-CM | POA: Diagnosis not present

## 2015-05-14 DIAGNOSIS — D631 Anemia in chronic kidney disease: Secondary | ICD-10-CM | POA: Diagnosis not present

## 2015-05-14 DIAGNOSIS — D509 Iron deficiency anemia, unspecified: Secondary | ICD-10-CM | POA: Diagnosis not present

## 2015-05-14 DIAGNOSIS — N2581 Secondary hyperparathyroidism of renal origin: Secondary | ICD-10-CM | POA: Diagnosis not present

## 2015-05-15 ENCOUNTER — Telehealth: Payer: Self-pay | Admitting: Neurology

## 2015-05-15 DIAGNOSIS — R159 Full incontinence of feces: Secondary | ICD-10-CM

## 2015-05-15 NOTE — Telephone Encounter (Signed)
[name] Called from Dr. Oletta Lamas office and relayed they could not schedule patient because in 2011. Patient transfer her care. I have called and left patient a message to call me back.

## 2015-05-15 NOTE — Telephone Encounter (Signed)
  I called the patient. The MRI of the low back does not show significant spinal stenosis that would explain the change in walking, and the fecal incontinence that has developed. The patient very well may have a small fiber neuropathy affecting these functions. She has had thoracic MRI in the past as well. The patient will be set up for evaluation through gastroenterology deceases any other reason for her fecal incontinence. She has seen Dr. Oletta Lamas previously.  MRI lumbar 05/14/2015:  IMPRESSION: This is an abnormal MRI of the lumbar spine without contrast showing the following: 1. Multilevel degenerative changes as detailed above and essentially unchanged when compared to the MRI dated 11/10/2013..  2. At T10-T11, T11-T12, T12-L1, L1-L2 and L5-S1 there are mild to moderate degenerative changes that do not appear to lead to spinal stenosis or significant foraminal or lateral recess stenosis.  3. At L2-L3 there is mild spinal stenosis but no nerve root compression. 4. At L3-L4, there is a left paramedian disc extrusion and mild spinal stenosis. There does not appear to be nerve root compression at this level. 5. At L4-L5, there is facet hypertrophy, ligamentum flavum hypertrophy and borderline spinal stenosis. Moderate left foraminal narrowing and bilateral lateral recess stenosis but there does not appear to be any definite nerve root compression. 6. The conus medullaris and cauda equina appear normal. 7. Unchanged diffuse decrease marrow signal that could be compatible with chronic renal insufficiency

## 2015-05-16 DIAGNOSIS — N2581 Secondary hyperparathyroidism of renal origin: Secondary | ICD-10-CM | POA: Diagnosis not present

## 2015-05-16 DIAGNOSIS — D631 Anemia in chronic kidney disease: Secondary | ICD-10-CM | POA: Diagnosis not present

## 2015-05-16 DIAGNOSIS — D509 Iron deficiency anemia, unspecified: Secondary | ICD-10-CM | POA: Diagnosis not present

## 2015-05-16 DIAGNOSIS — N186 End stage renal disease: Secondary | ICD-10-CM | POA: Diagnosis not present

## 2015-05-18 DIAGNOSIS — D509 Iron deficiency anemia, unspecified: Secondary | ICD-10-CM | POA: Diagnosis not present

## 2015-05-18 DIAGNOSIS — N2581 Secondary hyperparathyroidism of renal origin: Secondary | ICD-10-CM | POA: Diagnosis not present

## 2015-05-18 DIAGNOSIS — D631 Anemia in chronic kidney disease: Secondary | ICD-10-CM | POA: Diagnosis not present

## 2015-05-18 DIAGNOSIS — N186 End stage renal disease: Secondary | ICD-10-CM | POA: Diagnosis not present

## 2015-05-21 DIAGNOSIS — N2581 Secondary hyperparathyroidism of renal origin: Secondary | ICD-10-CM | POA: Diagnosis not present

## 2015-05-21 DIAGNOSIS — N186 End stage renal disease: Secondary | ICD-10-CM | POA: Diagnosis not present

## 2015-05-21 DIAGNOSIS — D631 Anemia in chronic kidney disease: Secondary | ICD-10-CM | POA: Diagnosis not present

## 2015-05-21 DIAGNOSIS — D509 Iron deficiency anemia, unspecified: Secondary | ICD-10-CM | POA: Diagnosis not present

## 2015-05-24 DIAGNOSIS — D631 Anemia in chronic kidney disease: Secondary | ICD-10-CM | POA: Diagnosis not present

## 2015-05-24 DIAGNOSIS — N186 End stage renal disease: Secondary | ICD-10-CM | POA: Diagnosis not present

## 2015-05-24 DIAGNOSIS — D509 Iron deficiency anemia, unspecified: Secondary | ICD-10-CM | POA: Diagnosis not present

## 2015-05-24 DIAGNOSIS — N2581 Secondary hyperparathyroidism of renal origin: Secondary | ICD-10-CM | POA: Diagnosis not present

## 2015-05-25 DIAGNOSIS — D631 Anemia in chronic kidney disease: Secondary | ICD-10-CM | POA: Diagnosis not present

## 2015-05-25 DIAGNOSIS — N2581 Secondary hyperparathyroidism of renal origin: Secondary | ICD-10-CM | POA: Diagnosis not present

## 2015-05-25 DIAGNOSIS — N186 End stage renal disease: Secondary | ICD-10-CM | POA: Diagnosis not present

## 2015-05-25 DIAGNOSIS — D509 Iron deficiency anemia, unspecified: Secondary | ICD-10-CM | POA: Diagnosis not present

## 2015-05-28 DIAGNOSIS — N2581 Secondary hyperparathyroidism of renal origin: Secondary | ICD-10-CM | POA: Diagnosis not present

## 2015-05-28 DIAGNOSIS — D631 Anemia in chronic kidney disease: Secondary | ICD-10-CM | POA: Diagnosis not present

## 2015-05-28 DIAGNOSIS — N186 End stage renal disease: Secondary | ICD-10-CM | POA: Diagnosis not present

## 2015-05-28 DIAGNOSIS — D509 Iron deficiency anemia, unspecified: Secondary | ICD-10-CM | POA: Diagnosis not present

## 2015-05-30 DIAGNOSIS — D631 Anemia in chronic kidney disease: Secondary | ICD-10-CM | POA: Diagnosis not present

## 2015-05-30 DIAGNOSIS — N2581 Secondary hyperparathyroidism of renal origin: Secondary | ICD-10-CM | POA: Diagnosis not present

## 2015-05-30 DIAGNOSIS — D509 Iron deficiency anemia, unspecified: Secondary | ICD-10-CM | POA: Diagnosis not present

## 2015-05-30 DIAGNOSIS — N186 End stage renal disease: Secondary | ICD-10-CM | POA: Diagnosis not present

## 2015-06-01 DIAGNOSIS — N2581 Secondary hyperparathyroidism of renal origin: Secondary | ICD-10-CM | POA: Diagnosis not present

## 2015-06-01 DIAGNOSIS — D509 Iron deficiency anemia, unspecified: Secondary | ICD-10-CM | POA: Diagnosis not present

## 2015-06-01 DIAGNOSIS — D631 Anemia in chronic kidney disease: Secondary | ICD-10-CM | POA: Diagnosis not present

## 2015-06-01 DIAGNOSIS — N186 End stage renal disease: Secondary | ICD-10-CM | POA: Diagnosis not present

## 2015-06-02 DIAGNOSIS — N186 End stage renal disease: Secondary | ICD-10-CM | POA: Diagnosis not present

## 2015-06-02 DIAGNOSIS — E1129 Type 2 diabetes mellitus with other diabetic kidney complication: Secondary | ICD-10-CM | POA: Diagnosis not present

## 2015-06-02 DIAGNOSIS — Z992 Dependence on renal dialysis: Secondary | ICD-10-CM | POA: Diagnosis not present

## 2015-06-03 ENCOUNTER — Inpatient Hospital Stay (HOSPITAL_BASED_OUTPATIENT_CLINIC_OR_DEPARTMENT_OTHER)
Admission: EM | Admit: 2015-06-03 | Discharge: 2015-06-05 | DRG: 190 | Disposition: A | Discharge status: Home / Self Care | Payer: Medicare Other | Attending: Family Medicine | Admitting: Family Medicine

## 2015-06-03 ENCOUNTER — Encounter (HOSPITAL_BASED_OUTPATIENT_CLINIC_OR_DEPARTMENT_OTHER): Payer: Self-pay | Admitting: Emergency Medicine

## 2015-06-03 DIAGNOSIS — J441 Chronic obstructive pulmonary disease with (acute) exacerbation: Secondary | ICD-10-CM

## 2015-06-03 DIAGNOSIS — N2581 Secondary hyperparathyroidism of renal origin: Secondary | ICD-10-CM | POA: Diagnosis present

## 2015-06-03 DIAGNOSIS — N186 End stage renal disease: Secondary | ICD-10-CM | POA: Diagnosis not present

## 2015-06-03 DIAGNOSIS — I509 Heart failure, unspecified: Secondary | ICD-10-CM

## 2015-06-03 DIAGNOSIS — J209 Acute bronchitis, unspecified: Secondary | ICD-10-CM | POA: Diagnosis not present

## 2015-06-03 DIAGNOSIS — E785 Hyperlipidemia, unspecified: Secondary | ICD-10-CM | POA: Diagnosis present

## 2015-06-03 DIAGNOSIS — Z886 Allergy status to analgesic agent status: Secondary | ICD-10-CM

## 2015-06-03 DIAGNOSIS — J440 Chronic obstructive pulmonary disease with acute lower respiratory infection: Secondary | ICD-10-CM | POA: Diagnosis not present

## 2015-06-03 DIAGNOSIS — F39 Unspecified mood [affective] disorder: Secondary | ICD-10-CM | POA: Diagnosis present

## 2015-06-03 DIAGNOSIS — R0902 Hypoxemia: Secondary | ICD-10-CM | POA: Diagnosis present

## 2015-06-03 DIAGNOSIS — F17210 Nicotine dependence, cigarettes, uncomplicated: Secondary | ICD-10-CM | POA: Diagnosis not present

## 2015-06-03 DIAGNOSIS — I129 Hypertensive chronic kidney disease with stage 1 through stage 4 chronic kidney disease, or unspecified chronic kidney disease: Secondary | ICD-10-CM | POA: Diagnosis present

## 2015-06-03 DIAGNOSIS — F17200 Nicotine dependence, unspecified, uncomplicated: Secondary | ICD-10-CM | POA: Diagnosis present

## 2015-06-03 DIAGNOSIS — Z6832 Body mass index (BMI) 32.0-32.9, adult: Secondary | ICD-10-CM | POA: Diagnosis not present

## 2015-06-03 DIAGNOSIS — R0602 Shortness of breath: Secondary | ICD-10-CM | POA: Diagnosis not present

## 2015-06-03 DIAGNOSIS — I120 Hypertensive chronic kidney disease with stage 5 chronic kidney disease or end stage renal disease: Secondary | ICD-10-CM | POA: Diagnosis present

## 2015-06-03 DIAGNOSIS — Z888 Allergy status to other drugs, medicaments and biological substances status: Secondary | ICD-10-CM

## 2015-06-03 DIAGNOSIS — K219 Gastro-esophageal reflux disease without esophagitis: Secondary | ICD-10-CM | POA: Diagnosis present

## 2015-06-03 DIAGNOSIS — Z992 Dependence on renal dialysis: Secondary | ICD-10-CM

## 2015-06-03 DIAGNOSIS — E7800 Pure hypercholesterolemia, unspecified: Secondary | ICD-10-CM | POA: Diagnosis present

## 2015-06-03 NOTE — ED Notes (Signed)
Patient reports shortness of breath which began this afternoon when laying down.  Patient currently undergoes dialysis M, W, F and has a graft in left arm.

## 2015-06-03 NOTE — ED Notes (Signed)
Patient placed on 2L oxygen via [place]-o2 100% via [place]

## 2015-06-04 ENCOUNTER — Emergency Department (HOSPITAL_BASED_OUTPATIENT_CLINIC_OR_DEPARTMENT_OTHER): Payer: Medicare Other

## 2015-06-04 DIAGNOSIS — I120 Hypertensive chronic kidney disease with stage 5 chronic kidney disease or end stage renal disease: Secondary | ICD-10-CM | POA: Diagnosis present

## 2015-06-04 DIAGNOSIS — N186 End stage renal disease: Secondary | ICD-10-CM | POA: Diagnosis not present

## 2015-06-04 DIAGNOSIS — I10 Essential (primary) hypertension: Secondary | ICD-10-CM | POA: Diagnosis not present

## 2015-06-04 DIAGNOSIS — F17210 Nicotine dependence, cigarettes, uncomplicated: Secondary | ICD-10-CM | POA: Diagnosis present

## 2015-06-04 DIAGNOSIS — J440 Chronic obstructive pulmonary disease with acute lower respiratory infection: Secondary | ICD-10-CM | POA: Diagnosis present

## 2015-06-04 DIAGNOSIS — J209 Acute bronchitis, unspecified: Secondary | ICD-10-CM | POA: Diagnosis not present

## 2015-06-04 DIAGNOSIS — Z992 Dependence on renal dialysis: Secondary | ICD-10-CM | POA: Diagnosis not present

## 2015-06-04 DIAGNOSIS — N2581 Secondary hyperparathyroidism of renal origin: Secondary | ICD-10-CM | POA: Diagnosis present

## 2015-06-04 DIAGNOSIS — R0902 Hypoxemia: Secondary | ICD-10-CM | POA: Diagnosis not present

## 2015-06-04 DIAGNOSIS — R0602 Shortness of breath: Secondary | ICD-10-CM | POA: Diagnosis not present

## 2015-06-04 DIAGNOSIS — Z888 Allergy status to other drugs, medicaments and biological substances status: Secondary | ICD-10-CM | POA: Diagnosis not present

## 2015-06-04 DIAGNOSIS — F39 Unspecified mood [affective] disorder: Secondary | ICD-10-CM | POA: Diagnosis present

## 2015-06-04 DIAGNOSIS — Z886 Allergy status to analgesic agent status: Secondary | ICD-10-CM | POA: Diagnosis not present

## 2015-06-04 DIAGNOSIS — Z6832 Body mass index (BMI) 32.0-32.9, adult: Secondary | ICD-10-CM | POA: Diagnosis not present

## 2015-06-04 DIAGNOSIS — E8770 Fluid overload, unspecified: Secondary | ICD-10-CM | POA: Diagnosis not present

## 2015-06-04 DIAGNOSIS — D631 Anemia in chronic kidney disease: Secondary | ICD-10-CM | POA: Diagnosis not present

## 2015-06-04 DIAGNOSIS — K219 Gastro-esophageal reflux disease without esophagitis: Secondary | ICD-10-CM | POA: Diagnosis present

## 2015-06-04 DIAGNOSIS — J441 Chronic obstructive pulmonary disease with (acute) exacerbation: Secondary | ICD-10-CM | POA: Diagnosis not present

## 2015-06-04 DIAGNOSIS — E785 Hyperlipidemia, unspecified: Secondary | ICD-10-CM | POA: Diagnosis present

## 2015-06-04 LAB — CBC WITH DIFFERENTIAL/PLATELET
BASOS PCT: 0 %
Basophils Absolute: 0 10*3/uL (ref 0.0–0.1)
Eosinophils Absolute: 0.5 10*3/uL (ref 0.0–0.7)
Eosinophils Relative: 8 %
HEMATOCRIT: 31.9 % — AB (ref 36.0–46.0)
HEMOGLOBIN: 9.9 g/dL — AB (ref 12.0–15.0)
LYMPHS ABS: 1.2 10*3/uL (ref 0.7–4.0)
Lymphocytes Relative: 17 %
MCH: 28.8 pg (ref 26.0–34.0)
MCHC: 31 g/dL (ref 30.0–36.0)
MCV: 92.7 fL (ref 78.0–100.0)
MONO ABS: 1.1 10*3/uL — AB (ref 0.1–1.0)
MONOS PCT: 16 %
NEUTROS ABS: 4.2 10*3/uL (ref 1.7–7.7)
Neutrophils Relative %: 59 %
Platelets: 215 10*3/uL (ref 150–400)
RBC: 3.44 MIL/uL — ABNORMAL LOW (ref 3.87–5.11)
RDW: 17 % — AB (ref 11.5–15.5)
WBC: 7 10*3/uL (ref 4.0–10.5)

## 2015-06-04 LAB — CBC
HEMATOCRIT: 32.4 % — AB (ref 36.0–46.0)
Hemoglobin: 10.1 g/dL — ABNORMAL LOW (ref 12.0–15.0)
MCH: 28.5 pg (ref 26.0–34.0)
MCHC: 31.2 g/dL (ref 30.0–36.0)
MCV: 91.3 fL (ref 78.0–100.0)
PLATELETS: 217 10*3/uL (ref 150–400)
RBC: 3.55 MIL/uL — AB (ref 3.87–5.11)
RDW: 17.4 % — ABNORMAL HIGH (ref 11.5–15.5)
WBC: 6 10*3/uL (ref 4.0–10.5)

## 2015-06-04 LAB — BASIC METABOLIC PANEL
ANION GAP: 16 — AB (ref 5–15)
Anion gap: 11 (ref 5–15)
BUN: 41 mg/dL — ABNORMAL HIGH (ref 6–20)
BUN: 42 mg/dL — AB (ref 6–20)
CALCIUM: 8.7 mg/dL — AB (ref 8.9–10.3)
CHLORIDE: 103 mmol/L (ref 101–111)
CO2: 24 mmol/L (ref 22–32)
CO2: 25 mmol/L (ref 22–32)
CREATININE: 10.79 mg/dL — AB (ref 0.44–1.00)
Calcium: 9.1 mg/dL (ref 8.9–10.3)
Chloride: 101 mmol/L (ref 101–111)
Creatinine, Ser: 10.65 mg/dL — ABNORMAL HIGH (ref 0.44–1.00)
GFR calc Af Amer: 4 mL/min — ABNORMAL LOW (ref >60)
GFR calc non Af Amer: 3 mL/min — ABNORMAL LOW (ref >60)
GFR, EST AFRICAN AMERICAN: 4 mL/min — AB (ref >60)
GFR, EST NON AFRICAN AMERICAN: 3 mL/min — AB (ref >60)
GLUCOSE: 106 mg/dL — AB (ref 65–99)
GLUCOSE: 122 mg/dL — AB (ref 65–99)
POTASSIUM: 5 mmol/L (ref 3.5–5.1)
Potassium: 4.6 mmol/L (ref 3.5–5.1)
Sodium: 138 mmol/L (ref 135–145)
Sodium: 142 mmol/L (ref 135–145)

## 2015-06-04 LAB — BRAIN NATRIURETIC PEPTIDE: B Natriuretic Peptide: 259.5 pg/mL — ABNORMAL HIGH (ref 0.0–100.0)

## 2015-06-04 LAB — MRSA PCR SCREENING: MRSA by PCR: NEGATIVE

## 2015-06-04 MED ORDER — DOXERCALCIFEROL 4 MCG/2ML IV SOLN
5.0000 ug | INTRAVENOUS | Status: DC
  Administered 2015-06-04: 5 ug via INTRAVENOUS

## 2015-06-04 MED ORDER — SEVELAMER CARBONATE 800 MG PO TABS
2400.0000 mg | ORAL_TABLET | Freq: Three times a day (TID) | ORAL | Status: DC
  Administered 2015-06-04 – 2015-06-05 (×3): 2400 mg via ORAL
  Filled 2015-06-04 (×3): qty 3

## 2015-06-04 MED ORDER — IPRATROPIUM-ALBUTEROL 0.5-2.5 (3) MG/3ML IN SOLN
RESPIRATORY_TRACT | Status: AC
  Filled 2015-06-04: qty 3

## 2015-06-04 MED ORDER — ALTEPLASE 2 MG IJ SOLR: 2.0000 mg | Freq: Once | INTRAMUSCULAR | Status: DC | PRN

## 2015-06-04 MED ORDER — LIDOCAINE-PRILOCAINE 2.5-2.5 % EX CREA: 1.0000 "application " | TOPICAL_CREAM | CUTANEOUS | Status: DC | PRN

## 2015-06-04 MED ORDER — NEPRO/CARBSTEADY PO LIQD
237.0000 mL | Freq: Two times a day (BID) | ORAL | Status: DC
  Administered 2015-06-05: 237 mL via ORAL
  Filled 2015-06-04: qty 237

## 2015-06-04 MED ORDER — ASPIRIN EC 81 MG PO TBEC
81.0000 mg | DELAYED_RELEASE_TABLET | Freq: Every morning | ORAL | Status: DC
  Administered 2015-06-04 – 2015-06-05 (×2): 81 mg via ORAL
  Filled 2015-06-04 (×2): qty 1

## 2015-06-04 MED ORDER — HEPARIN SODIUM (PORCINE) 1000 UNIT/ML DIALYSIS: 4000.0000 [IU] | Freq: Once | INTRAMUSCULAR | Status: DC

## 2015-06-04 MED ORDER — ACETAMINOPHEN 650 MG RE SUPP: 650.0000 mg | Freq: Four times a day (QID) | RECTAL | Status: DC | PRN

## 2015-06-04 MED ORDER — LACTULOSE 10 GM/15ML PO SOLN
10.0000 g | Freq: Every day | ORAL | Status: DC
  Filled 2015-06-04: qty 15

## 2015-06-04 MED ORDER — PREGABALIN 75 MG PO CAPS
75.0000 mg | ORAL_CAPSULE | Freq: Every evening | ORAL | Status: DC
  Administered 2015-06-04: 75 mg via ORAL
  Filled 2015-06-04: qty 1

## 2015-06-04 MED ORDER — DOXYCYCLINE HYCLATE 100 MG PO TABS
100.0000 mg | ORAL_TABLET | Freq: Two times a day (BID) | ORAL | Status: DC
  Administered 2015-06-04 – 2015-06-05 (×2): 100 mg via ORAL
  Filled 2015-06-04 (×2): qty 1

## 2015-06-04 MED ORDER — LORATADINE 10 MG PO TABS: 10.0000 mg | ORAL_TABLET | Freq: Every day | ORAL | Status: DC | PRN

## 2015-06-04 MED ORDER — ARIPIPRAZOLE 2 MG PO TABS
1.0000 mg | ORAL_TABLET | Freq: Every day | ORAL | Status: DC
  Administered 2015-06-04 – 2015-06-05 (×2): 1 mg via ORAL
  Filled 2015-06-04 (×2): qty 1

## 2015-06-04 MED ORDER — IPRATROPIUM-ALBUTEROL 0.5-2.5 (3) MG/3ML IN SOLN
3.0000 mL | Freq: Once | RESPIRATORY_TRACT | Status: AC
  Administered 2015-06-04: 3 mL via RESPIRATORY_TRACT

## 2015-06-04 MED ORDER — IPRATROPIUM-ALBUTEROL 0.5-2.5 (3) MG/3ML IN SOLN
RESPIRATORY_TRACT | Status: AC
  Administered 2015-06-04: 3 mL via RESPIRATORY_TRACT
  Filled 2015-06-04: qty 3

## 2015-06-04 MED ORDER — IPRATROPIUM-ALBUTEROL 0.5-2.5 (3) MG/3ML IN SOLN
3.0000 mL | RESPIRATORY_TRACT | Status: DC
  Administered 2015-06-04 – 2015-06-05 (×11): 3 mL via RESPIRATORY_TRACT
  Filled 2015-06-04 (×9): qty 3

## 2015-06-04 MED ORDER — ACETAMINOPHEN 325 MG PO TABS
ORAL_TABLET | ORAL | Status: AC
Start: 2015-06-04 — End: 2015-06-05
  Filled 2015-06-04: qty 2

## 2015-06-04 MED ORDER — HEPARIN SODIUM (PORCINE) 1000 UNIT/ML DIALYSIS: 1000.0000 [IU] | INTRAMUSCULAR | Status: DC | PRN

## 2015-06-04 MED ORDER — ATORVASTATIN CALCIUM 40 MG PO TABS
40.0000 mg | ORAL_TABLET | Freq: Every day | ORAL | Status: DC
  Administered 2015-06-04 – 2015-06-05 (×2): 40 mg via ORAL
  Filled 2015-06-04 (×2): qty 1

## 2015-06-04 MED ORDER — ACETAMINOPHEN 325 MG PO TABS
650.0000 mg | ORAL_TABLET | Freq: Four times a day (QID) | ORAL | Status: DC | PRN
  Administered 2015-06-04: 650 mg via ORAL

## 2015-06-04 MED ORDER — LIDOCAINE HCL (PF) 1 % IJ SOLN: 5.0000 mL | INTRAMUSCULAR | Status: DC | PRN

## 2015-06-04 MED ORDER — HEPARIN SODIUM (PORCINE) 5000 UNIT/ML IJ SOLN
5000.0000 [IU] | Freq: Three times a day (TID) | INTRAMUSCULAR | Status: DC
  Administered 2015-06-04 – 2015-06-05 (×3): 5000 [IU] via SUBCUTANEOUS
  Filled 2015-06-04 (×2): qty 1

## 2015-06-04 MED ORDER — PENTAFLUOROPROP-TETRAFLUOROETH EX AERO: 1.0000 "application " | INHALATION_SPRAY | CUTANEOUS | Status: DC | PRN

## 2015-06-04 MED ORDER — METOPROLOL SUCCINATE ER 100 MG PO TB24
100.0000 mg | ORAL_TABLET | Freq: Every day | ORAL | Status: DC
  Administered 2015-06-04: 100 mg via ORAL
  Filled 2015-06-04: qty 1

## 2015-06-04 MED ORDER — LOSARTAN POTASSIUM 50 MG PO TABS
100.0000 mg | ORAL_TABLET | Freq: Every day | ORAL | Status: DC
  Administered 2015-06-04 – 2015-06-05 (×2): 100 mg via ORAL
  Filled 2015-06-04 (×2): qty 2

## 2015-06-04 MED ORDER — RENA-VITE PO TABS
1.0000 | ORAL_TABLET | Freq: Every day | ORAL | Status: DC
  Administered 2015-06-04: 1 via ORAL
  Filled 2015-06-04: qty 1

## 2015-06-04 MED ORDER — SODIUM CHLORIDE 0.9 % IV SOLN: 100.0000 mL | INTRAVENOUS | Status: DC | PRN

## 2015-06-04 MED ORDER — PREDNISONE 20 MG PO TABS
40.0000 mg | ORAL_TABLET | Freq: Every day | ORAL | Status: DC
  Administered 2015-06-04 – 2015-06-05 (×2): 40 mg via ORAL
  Filled 2015-06-04 (×2): qty 2

## 2015-06-04 MED ORDER — DOXERCALCIFEROL 4 MCG/2ML IV SOLN
INTRAVENOUS | Status: AC
  Filled 2015-06-04: qty 4

## 2015-06-04 MED ORDER — ALBUTEROL SULFATE (2.5 MG/3ML) 0.083% IN NEBU: 2.5000 mg | INHALATION_SOLUTION | RESPIRATORY_TRACT | Status: AC | PRN

## 2015-06-04 MED ORDER — CINACALCET HCL 30 MG PO TABS
30.0000 mg | ORAL_TABLET | Freq: Every day | ORAL | Status: DC
  Administered 2015-06-04: 30 mg via ORAL
  Filled 2015-06-04: qty 1

## 2015-06-04 MED ORDER — SEVELAMER CARBONATE 800 MG PO TABS: 2400.0000 mg | ORAL_TABLET | Freq: Three times a day (TID) | ORAL | Status: DC | PRN

## 2015-06-04 NOTE — Procedures (Signed)
Patient seen on Hemodialysis. QB 325, UF goal 2.5L Treatment adjusted as needed.  Elmarie Shiley MD [facility]. Office # [phone] Pager # [phone] 11:49 AM

## 2015-06-04 NOTE — Progress Notes (Signed)
Per request of MD, lab called and BNP added on.

## 2015-06-04 NOTE — H&P (Signed)
[facility] Admission History and Physical Service Pager: [phone]  Patient name: [name] Medical record number: [id number] Date of birth: 1940/10/16 Age: 75 y.o. Gender: female  Primary Care Provider: Junie Panning, DO Consultants: none Code Status: FULL (discussed on admission)  Chief Complaint: shortness of breath   Assessment and Plan: [name] is a 75 y.o. female presenting with shortness of breath to [facility] . PMH is significant for ESRD on HD, anemia, depression, CHF, GERD, hypertension, HLD, tobacco abuse, gastroparesis  # Acute bronchitis with bronchospasm and hypoxia: Per ED report patient with desaturations down to 91% on RA, increased work of breathing and wheeze.  Place on 2 L [place] and O2 improved.  No leukocytosis, patient afebrile, VSS.  Patient s/p 2 albuterol nebs.  Breathing improved after this.   CXR with vascular congestion.  No evidence of fluid overload on exam.  Patient's weight is below dry weight per her.  However, she notes that she does not swell when she is retaining fluid - Admit to FPTS under Dr Erin Hearing - Med Surg - VS per floor protocol - Albuterol as needed wheeze - Wean O2 as able - BNP ordered - CBC, BMP in am  # ESRD on HD. MWF schedule - c/s Renal in am for HD - continue renal vitamins - SLIV - Renal diet  # hypertension : soft on admission. On Toprol XL 100mg , Cozaar 100mg , Clonidine 0.1 - hold home medications  # HLD: on lipitor - continue lipitor  # Mood disorder: on abilify outpatient - continue home med  FEN/GI: SLIV, Renal diet Prophylaxis: Sub-q heparin  Disposition: Admit for hypoxia.  History of Present Illness:  [name] is a 75 y.o. female presenting with shortness of breath   She was transferred from [facility] after being seen for shortness of breath, cough, wheeze.  She developed an O2 requirement today with desaturations to 91% on RA.  She was placed on 2L O2  and is now at 99%.  She received 2 albuterol nebs there and wheezes improved.  Patient reports that she started with a scratchy throat on Thursday, which progressed to cough, wheeze and shortness of breath yesterday.  She has no known dx of COPD or asthma but does note she is an active smoker of 1ppd since she was 75 yo.  She is on HD Mon, Wed, Fri.  She reports compliance with diet and fluid restriction.  Denies fevers but notes that her sister has been sick.  Review Of Systems: Per HPI with the following additions: none Otherwise the remainder of the systems were negative.  Patient Active Problem List   Diagnosis Date Noted  . Acute bronchitis with bronchospasm 06/04/2015  . Onychocryptosis 04/08/2015  . Abnormality of gait 03/28/2015  . Kidney mass   . Hypervolemia   . Pulmonary edema   . Renal mass   . End stage renal disease on dialysis ([place])   . Systolic hypertension   . Acute left flank pain   . Inadequate pain control 12/30/2014  . CHF (congestive heart failure) ([place]) 12/30/2014  . Respiratory failure ([place]) 12/30/2014  . Meralgia paresthetica of right side 12/26/2014  . Thigh pain 12/15/2014  . Mouth ulceration 09/28/2014  . Hereditary and idiopathic peripheral neuropathy 02/15/2013  . Dermatitis of face 08/19/2012  . End stage renal disease ([place]) 05/06/2012  . Gastroparesis 08/23/2011  . HIP PAIN, BILATERAL 12/07/2008  . BLADDER PROLAPSE 11/17/2007  . OSTEOARTHRITIS, GENERALIZED, MULTIPLE  JOINTS 08/18/2007  . SECONDARY HYPERPARATHYROIDISM 06/07/2007  . ANXIETY STATE, UNSPECIFIED 04/23/2007  . ANEMIA NEC 03/31/2007  . TOBACCO ABUSE 12/10/2006  . HIATAL HERNIA 12/10/2006  . Dysphagia, unspecified(787.20) 12/10/2006  . HYPERCHOLESTEROLEMIA 07/30/2006  . Gout, unspecified 07/30/2006  . DEPRESSIVE DISORDER, NOS 07/30/2006  . HYPERTENSION, BENIGN SYSTEMIC 07/30/2006  . GASTROESOPHAGEAL REFLUX, NO ESOPHAGITIS 07/30/2006    Past Medical History: Past Medical History   Diagnosis Date  . Hyperlipidemia   . Normal cardiac stress test 12/24/2009    lexiscan, imaging normal  . 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 Diverticulitis   . IBS (irritable bowel syndrome)   . Thyroid disease   . Depression   . Chronic kidney disease     Hemo MWF  . GERD (gastroesophageal reflux disease)   . H/O hiatal hernia   . Arthritis   . RLS (restless legs syndrome)   . Tubular adenoma of colon 01/2008  . Seizures ([place])     2004  . Constipation   . Sinus complaint   . Dialysis patient [facility])     kidney  . Renal disorder   . Adrenal mass ([place])   . Hypertension   . High cholesterol   . Back pain   . Meralgia paresthetica of right side 12/26/2014  . Abnormality of gait 03/28/2015    Past Surgical History: Past Surgical History  Procedure Laterality Date  . Cardiac catheterization  2003    normal  . Cholecystectomy      Open mid line incision  . Frontal craniotomy  2002    indication = sinusitis  . Appendectomy    . Tubal ligation    . Abdominal hysterectomy    . Insertion of dialysis catheter      Left  . Revison of arteriovenous fistula  05/11/2012    Procedure: REVISON OF ARTERIOVENOUS FISTULA;  Surgeon: Elam Dutch, MD;  Location: [place];  Service: Vascular;  Laterality: Right;  . Av fistula placement Left 11/11/2012    Procedure: INSERTION OF ARTERIOVENOUS (AV) GORE-TEX GRAFT ARM;  Surgeon: Angelia Mould, MD;  Location: [place];  Service: Vascular;  Laterality: Left;  . Avgg removal Left 11/18/2012    Procedure: REMOVAL OF LEFT UPPER ARM ARTERIOVENOUS GORETEX GRAFT ([place]);  Surgeon: Angelia Mould, MD;  Location: [place];  Service: Vascular;  Laterality: Left;  . Patch angioplasty Left 11/18/2012    Procedure: PATCH ANGIOPLASTY;  Surgeon: Angelia Mould, MD;  Location: [place];  Service: Vascular;  Laterality: Left;  . Frontal cranialcity  2002  . Peripheral vascular catheterization Left 03/08/2015     Procedure: A/V Shuntogram/Fistulagram;  Surgeon: Algernon Huxley, MD;  Location: [place] CV LAB;  Service: Cardiovascular;  Laterality: Left;  . Peripheral vascular catheterization Left 03/08/2015    Procedure: A/V Shunt Intervention;  Surgeon: Algernon Huxley, MD;  Location: [place] CV LAB;  Service: Cardiovascular;  Laterality: Left;   Social History: Social History  Substance Use Topics  . Smoking status: Current Every Day Smoker -- 1.00 packs/day for 60 years    Types: Cigarettes    Start date: 06/16/2014  . Smokeless tobacco: Never Used     Comment: pt states she smokes "1" cig per day  . Alcohol Use: No   Additional social history: none  Please also refer to relevant sections of EMR.  Family History: Family History  Problem Relation Age of Onset  .  Thyroid cancer Mother   . Heart disease Father   . Hypertension Father   . Heart attack Father   . Breast cancer Sister   . Lupus Daughter      Allergies and Medications: Allergies  Allergen Reactions  . Codeine Nausea And Vomiting  . Tuberculin Tests Hives    "blisters"  . Penicillins Rash    No problems breathing. Has tolerated omnicef in past without issue  . Sulfamethoxazole Rash   No current facility-administered medications on file prior to encounter.   Current Outpatient Prescriptions on File Prior to Encounter  Medication Sig Dispense Refill  . ABILIFY 2 MG tablet Take 0.5 tablets (1 mg total) by mouth daily. 90 tablet 3  . allopurinol (ZYLOPRIM) 100 MG tablet TAKE ONE TABLET BY MOUTH ONCE DAILY ON  THE  DAYS  FOLLOWING  DIALYSIS  (TUESDAY,  THURSDAY  AND  SATURDAYS) 30 tablet 11  . amLODipine (NORVASC) 10 MG tablet TAKE ONE TABLET BY MOUTH ONCE DAILY 90 tablet 0  . aspirin EC 81 MG tablet Take 81 mg by mouth every morning.    Marland Kitchen atorvastatin (LIPITOR) 40 MG tablet Take 1 tablet (40 mg total) by mouth daily. 90 tablet 3  . cinacalcet (SENSIPAR) 30 MG tablet Take 30 mg by mouth at bedtime.    . cloNIDine  (CATAPRES) 0.1 MG tablet Take 0.1-0.2 mg by mouth 2 (two) times daily. Take 1 tablet in the morning and 2 tablets in the evening    . fluticasone (FLONASE) 50 MCG/ACT nasal spray Place 2 sprays into both nostrils daily as needed for allergies or rhinitis.     . folic acid-vitamin b complex-vitamin c-selenium-zinc (DIALYVITE) 3 MG TABS tablet Take 1 tablet by mouth daily.    Marland Kitchen HYDROcodone-acetaminophen (NORCO/VICODIN) 5-325 MG tablet Take 1 tablet by mouth every 6 (six) hours as needed for moderate pain. 5 tablet 0  . lactulose (CHRONULAC) 10 GM/15ML solution Take 15 mLs (10 g total) by mouth daily. 236 mL 0  . lidocaine-prilocaine (EMLA) cream Apply 1 application topically 3 (three) times a week. Use before dialysis on MWF    . loratadine (CLARITIN) 10 MG tablet Take 1 tablet (10 mg total) by mouth daily as needed for allergies. 90 tablet 3  . losartan (COZAAR) 100 MG tablet Take 1 tablet (100 mg total) by mouth daily. 90 tablet 3  . metoprolol succinate (TOPROL-XL) 100 MG 24 hr tablet Take 100 mg by mouth at bedtime. Take with or immediately following a meal.    . multivitamin (RENA-VIT) TABS tablet Take 1 tablet by mouth at bedtime. 60 tablet 5  . omeprazole (PRILOSEC) 20 MG capsule Take 20 mg by mouth 2 (two) times daily.    . pregabalin (LYRICA) 75 MG capsule Take 75 mg by mouth every evening.     . senna (SENOKOT) 8.6 MG tablet Take 2 tablets by mouth daily as needed. For constipation    . sevelamer carbonate (RENVELA) 800 MG tablet Take 2,400 mg by mouth 3 (three) times daily with meals as needed (whenever food is consumed).       Objective: BP 145/64 mmHg  Pulse 71  Temp(Src) 98.5 F (36.9 C) (Oral)  Resp 17  Ht 5\' 1"  (1.549 m)  Wt 172 lb 6.4 oz (78.2 kg)  BMI 32.59 kg/m2  SpO2 97% Exam: General: awake, alert, well appearing Eyes: EOMI ENTM: MM dry, poor dentition Neck: supple Cardiovascular: RRR, no murmurs Respiratory: global expiratory wheeze, good air movement, 2L [place] in  place, no increased WOB Abdomen: soft, NT/ND, +BS MSK: warm, no edema, moves all extremities Skin: dry, intact Neuro: follows commands, no focal deficits Psych: mood stable, good eye contact  Labs and Imaging: CBC BMET   Recent Labs Lab 06/04/15 0040  WBC 7.0  HGB 9.9*  HCT 31.9*  PLT 215    Recent Labs Lab 06/04/15 0040  NA 138  K 4.6  CL 103  CO2 24  BUN 42*  CREATININE 10.79*  GLUCOSE 122*  CALCIUM 8.7*     Dg Chest 2 View  06/04/2015  CLINICAL DATA:  Acute onset of shortness of breath. Initial encounter. EXAM: CHEST  2 VIEW COMPARISON:  Chest radiograph performed 12/31/2014 FINDINGS: The lungs are well-aerated. Vascular congestion is noted. Peribronchial thickening is seen. There is no evidence of focal opacification, pleural effusion or pneumothorax. The heart is normal in size; the mediastinal contour is within normal limits. No acute osseous abnormalities are seen. A left-sided stent is noted. IMPRESSION: Vascular congestion noted.  Peribronchial thickening seen. Electronically Signed   By: Garald Balding M.D.   On: 06/04/2015 00:47   Janora Norlander, DO 06/04/2015, 2:11 AM PGY-2, [place] [facility] pager: [phone], text pages welcome

## 2015-06-04 NOTE — Consult Note (Signed)
 [facility] Renal Consultation Note    Indication for Consultation:  Management of ESRD/hemodialysis; anemia, hypertension/volume and secondary hyperparathyroidism PCP: Junie Panning, DO  HPI: [name] is a 75 y.o. female with ESRD on hemodialysis MWF at [facility]. Past medical history signficant for hypertension, hyperlipidemia, secondary hyperparathyroidism, anemia of chronic disease, hyperlipidemia, tubular ademona of colon 2009, thyroid disease, diverticulitis, GERD, arthritis, IBS. She presents with C/O SOB since 05/31/15. She attended her regular HD session Friday without relief of symptoms. She presented to [facility] ED for evaluation 06/02/14. CXR on admission showed vascular congestion/peribronchial thickening. WBC 7.0, NA 138  K+ 4.6 Scr 10.79 BUN 42. O2 sats was 91% on RA. Patient was admitted to [facility] for hypoxia/bronchitis per primary. Currently patient was found attempting to get back in and bed and acutely SOB. Assisted up in bed. "I don't know why I'm SOB because I didn't have any fluid on me". Positioned in high fowlers position. Patient has difficulty talking. Patient denies fevers, chills, C/O SOB,  has productive cough with clear secretions at present. No chest pain, N, V, D. No abdominal pain or flank pain, dysuria, denies dizziness, HA, vision changes, tinnitus, tarry stools, hematochezia. Patient has been smoking cigarettes.    Patient attends hemodialysis at [facility] She is compliant with HD prescription. Last incenter values are as follows: HGB 10.4 Fe 89 T Sat 40% Ca 9.0 C Ca 9.4 Phos 7.6 PTH 440 (05/30/2015). Patient is prescribed  Renvela binders, sensipar, and hectoral.   Past Medical History  Diagnosis Date  . Hyperlipidemia   . Normal cardiac stress test 12/24/2009    lexiscan, imaging normal  . 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 Diverticulitis   . IBS (irritable bowel syndrome)   . Thyroid  disease   . Depression   . Chronic kidney disease     Hemo MWF  . GERD (gastroesophageal reflux disease)   . H/O hiatal hernia   . Arthritis   . RLS (restless legs syndrome)   . Tubular adenoma of colon 01/2008  . Seizures ([place])     2004  . Constipation   . Sinus complaint   . Dialysis patient [facility])     kidney  . Renal disorder   . Adrenal mass ([place])   . Hypertension   . High cholesterol   . Back pain   . Meralgia paresthetica of right side 12/26/2014  . Abnormality of gait 03/28/2015   Past Surgical History  Procedure Laterality Date  . Cardiac catheterization  2003    normal  . Cholecystectomy      Open mid line incision  . Frontal craniotomy  2002    indication = sinusitis  . Appendectomy    . Tubal ligation    . Abdominal hysterectomy    . Insertion of dialysis catheter      Left  . Revison of arteriovenous fistula  05/11/2012    Procedure: REVISON OF ARTERIOVENOUS FISTULA;  Surgeon: Elam Dutch, MD;  Location: [place];  Service: Vascular;  Laterality: Right;  . Av fistula placement Left 11/11/2012    Procedure: INSERTION OF ARTERIOVENOUS (AV) GORE-TEX GRAFT ARM;  Surgeon: Angelia Mould, MD;  Location: [place];  Service: Vascular;  Laterality: Left;  . Avgg removal Left 11/18/2012    Procedure: REMOVAL OF LEFT UPPER ARM ARTERIOVENOUS GORETEX GRAFT ([place]);  Surgeon: Angelia Mould, MD;  Location: [place];  Service: Vascular;  Laterality: Left;  . Patch angioplasty Left 11/18/2012    Procedure: PATCH ANGIOPLASTY;  Surgeon: Angelia Mould, MD;  Location: [place];  Service: Vascular;  Laterality: Left;  . Frontal cranialcity  2002  . Peripheral vascular catheterization Left 03/08/2015    Procedure: A/V Shuntogram/Fistulagram;  Surgeon: Algernon Huxley, MD;  Location: [place] CV LAB;  Service: Cardiovascular;  Laterality: Left;  . Peripheral vascular catheterization Left 03/08/2015    Procedure: A/V Shunt Intervention;  Surgeon: Algernon Huxley, MD;  Location:  [place] CV LAB;  Service: Cardiovascular;  Laterality: Left;   Family History  Problem Relation Age of Onset  . Thyroid cancer Mother   . Heart disease Father   . Hypertension Father   . Heart attack Father   . Breast cancer Sister   . Lupus Daughter    Social History:  reports that she has been smoking Cigarettes.  She started smoking about a year ago. She has a 60 pack-year smoking history. She has never used smokeless tobacco. She reports that she does not drink alcohol or use illicit drugs. Allergies  Allergen Reactions  . Codeine Nausea And Vomiting  . Tuberculin Tests Hives    "blisters"  . Penicillins Rash    No problems breathing. Has tolerated omnicef in past without issue  . Sulfamethoxazole Rash   Prior to Admission medications   Medication Sig Start Date End Date Taking? Authorizing Provider  ABILIFY 2 MG tablet Take 0.5 tablets (1 mg total) by mouth daily. 11/23/14  Yes Coral Spikes, DO  allopurinol (ZYLOPRIM) 100 MG tablet TAKE ONE TABLET BY MOUTH ONCE DAILY ON  THE  DAYS  FOLLOWING  DIALYSIS  (TUESDAY,  THURSDAY  AND  SATURDAYS) 06/27/14  Yes Bryan R Hess, DO  amLODipine (NORVASC) 10 MG tablet TAKE ONE TABLET BY MOUTH ONCE DAILY 09/25/14  Yes Coral Spikes, DO  aspirin EC 81 MG tablet Take 81 mg by mouth every morning.   Yes Historical Provider, MD  atorvastatin (LIPITOR) 40 MG tablet Take 1 tablet (40 mg total) by mouth daily. 02/26/15  Yes Edgerton N Rumley, DO  cinacalcet (SENSIPAR) 30 MG tablet Take 30 mg by mouth at bedtime.   Yes Historical Provider, MD  cloNIDine (CATAPRES) 0.1 MG tablet Take 0.1-0.2 mg by mouth 2 (two) times daily. Take 1 tablet in the morning and 2 tablets in the evening   Yes Historical Provider, MD  fluticasone (FLONASE) 50 MCG/ACT nasal spray Place 2 sprays into both nostrils daily as needed for allergies or rhinitis.    Yes Historical Provider, MD  folic acid-vitamin b complex-vitamin c-selenium-zinc (DIALYVITE) 3 MG TABS tablet Take 1  tablet by mouth daily.   Yes Historical Provider, MD  lidocaine-prilocaine (EMLA) cream Apply 1 application topically 3 (three) times a week. Use before dialysis on MWF   Yes Historical Provider, MD  loratadine (CLARITIN) 10 MG tablet Take 1 tablet (10 mg total) by mouth daily as needed for allergies. 09/27/14  Yes Coral Spikes, DO  losartan (COZAAR) 100 MG tablet Take 1 tablet (100 mg total) by mouth daily. 09/27/14  Yes Coral Spikes, DO  metoprolol succinate (TOPROL-XL) 100 MG 24 hr tablet Take 100 mg by mouth at bedtime. Take with or immediately following a meal.   Yes Historical Provider, MD  multivitamin (RENA-VIT) TABS tablet Take 1 tablet by mouth at bedtime. 01/01/15  Yes Alyssa A Haney, MD  omeprazole (PRILOSEC) 20 MG capsule Take 20 mg  by mouth 2 (two) times daily.   Yes Historical Provider, MD  pregabalin (LYRICA) 75 MG capsule Take 75 mg by mouth every evening.    Yes Historical Provider, MD  senna (SENOKOT) 8.6 MG tablet Take 2 tablets by mouth daily as needed. For constipation   Yes Historical Provider, MD  sevelamer carbonate (RENVELA) 800 MG tablet Take 2,400 mg by mouth 3 (three) times daily with meals as needed (whenever food is consumed).    Yes Historical Provider, MD   Current Facility-Administered Medications  Medication Dose Route Frequency Provider Last Rate Last Dose  . acetaminophen (TYLENOL) tablet 650 mg  650 mg Oral Q6H PRN Janora Norlander, DO       Or  . acetaminophen (TYLENOL) suppository 650 mg  650 mg Rectal Q6H PRN Ashly M Gottschalk, DO      . albuterol (PROVENTIL) (2.5 MG/3ML) 0.083% nebulizer solution 2.5 mg  2.5 mg Nebulization Q4H PRN John Molpus, MD      . ARIPiprazole (ABILIFY) tablet 1 mg  1 mg Oral Daily Ashly M Gottschalk, DO      . aspirin EC tablet 81 mg  81 mg Oral q morning - 10a Ashly M Gottschalk, DO      . atorvastatin (LIPITOR) tablet 40 mg  40 mg Oral Daily Ashly M Gottschalk, DO      . cinacalcet (SENSIPAR) tablet 30 mg  30 mg Oral Q supper  Ashly M Gottschalk, DO      . heparin injection 5,000 Units  5,000 Units Subcutaneous 3 times per day Janora Norlander, DO   5,000 Units at 06/04/15 0526  . ipratropium-albuterol (DUONEB) 0.5-2.5 (3) MG/3ML nebulizer solution 3 mL  3 mL Nebulization Q4H John Molpus, MD   3 mL at 06/04/15 0800  . lactulose (CHRONULAC) 10 GM/15ML solution 10 g  10 g Oral Daily Ashly M Gottschalk, DO      . loratadine (CLARITIN) tablet 10 mg  10 mg Oral Daily PRN Janora Norlander, DO      . multivitamin (RENA-VIT) tablet 1 tablet  1 tablet Oral QHS Ashly M Gottschalk, DO      . pregabalin (LYRICA) capsule 75 mg  75 mg Oral QPM Ashly M Gottschalk, DO      . sevelamer carbonate (RENVELA) tablet 2,400 mg  2,400 mg Oral TID WC Valentina Gu, NP       Labs: Basic Metabolic Panel:  Recent Labs Lab 06/04/15 0040 06/04/15 0613  NA 138 142  K 4.6 5.0  CL 103 101  CO2 24 25  GLUCOSE 122* 106*  BUN 42* 41*  CREATININE 10.79* 10.65*  CALCIUM 8.7* 9.1   CBC:  Recent Labs Lab 06/04/15 0040 06/04/15 0613  WBC 7.0 6.0  NEUTROABS 4.2  --   HGB 9.9* 10.1*  HCT 31.9* 32.4*  MCV 92.7 91.3  PLT 215 217   Dg Chest 2 View  06/04/2015  CLINICAL DATA:  Acute onset of shortness of breath. Initial encounter. EXAM: CHEST  2 VIEW COMPARISON:  Chest radiograph performed 12/31/2014 FINDINGS: The lungs are well-aerated. Vascular congestion is noted. Peribronchial thickening is seen. There is no evidence of focal opacification, pleural effusion or pneumothorax. The heart is normal in size; the mediastinal contour is within normal limits. No acute osseous abnormalities are seen. A left-sided stent is noted. IMPRESSION: Vascular congestion noted.  Peribronchial thickening seen. Electronically Signed   By: Garald Balding M.D.   On: 06/04/2015 00:47    ROS: As per  HPI otherwise negative.   Physical Exam: Filed Vitals:   06/04/15 0200 06/04/15 0323 06/04/15 0350 06/04/15 0447  BP: 145/64 117/38  108/31  Pulse: 71 67   68  Temp:  97.7 F (36.5 C)  98.6 F (37 C)  TempSrc:  Oral  Oral  Resp: 17 20  20   Height:      Weight:  77.948 kg (171 lb 13.5 oz)    SpO2: 97% 100% 97% 99%     General: Well developed, well nourished, in mild distress due to SOB.  Head: Normocephalic, atraumatic, sclera non-icteric, mucus membranes are moist Neck: Supple. JVD not elevated. Lungs: Bilateral breath sounds with inspiratory wheezing RUL, few scattered rhonchi R & L upper lobes, few bibasilar crackles. Has DOE. Currently on 2L/M nasal cannula without WOB after repositioned. Voice raspy.  Heart: RRR with S1 S2. No murmurs, rubs, or gallops appreciated. Abdomen: Soft, non-tender, non-distended with normoactive bowel sounds. No rebound/guarding. No obvious abdominal masses. M-S:  Strength and tone appear normal for age. Lower extremities:without edema or ischemic changes, no open wounds  Neuro: Alert and oriented X 3. Moves all extremities spontaneously. Psych:  Responds to questions appropriately with a normal affect. Dialysis Access: LUA Hero graft + bruit  Dialysis Orders: [facility] MonWedFri, 3 hrs 45 min, 180NRe Optiflux, BFR 400, DFR Autoflow 1.5, EDW 80.5 (kg), Dialysate 2.0 K, 2.0 Ca, UFR Profile: Profile 4, Sodium Model: None, Access: LUA AV Graft-HeRO Heparin: 4000 units per treatment Hectoral: 4 mcg IV q MWF Mircera: 50 mcg IV q 4 weeks ( last dose 05/30/15)  Assessment/Plan: 1.  Acute Bronchitis with Hypoxia: Per primary. No fevers, albuterol and duonebs ordered. Smoking cessation teaching needed.  2.  ESRD -  MWF at [facility]. Will have HD today on schedule K+ 5.0. 2.0 K bath 3.  Hypertension/volume  - Patient prescribed losartan 100 mg PO daily, Metoprolol succ 100 mg PO daily, Amlodipine 10 mg q hs and Clonidine 0.1 mg PO BID. All meds on hold at present. BP soft. OP EDW 80.5. Has not reached EDW in past 6 treatments and is currently under EDW.  Will attempt 1.5-2  liters today. Raise EDW on DC.  4.  Anemia  - HGB 10.1. Last ESA dose 12/18. Will follow HGB.  5.  Metabolic bone disease -  On Renvela, hectoral and sensipar. Will continue. Increase hectoral to 5 mcg IV q treatment. Question compliance with binders as OP.  6.  Nutrition - Renal diet with fld restrictions. Add Nepro/renal vits.     H. Owens Shark, NP-C 06/04/2015, 10:20 AM  [employer] [phone]

## 2015-06-04 NOTE — ED Provider Notes (Signed)
CSN: [id number]     Arrival date & time 06/03/15  2305 History   First MD Initiated Contact with Patient 06/04/15 0105     Chief Complaint  Patient presents with  . Shortness of Breath     (Consider location/radiation/quality/duration/timing/severity/associated sxs/prior Treatment) HPI  This is a 75 year old female with end-stage renal disease on hemodialysis. She is here with a four-day history of cough and shortness of breath. Her symptoms acutely worsened yesterday afternoon and she began wheezing. On arrival she was noted to be wheezing, tachypnea with a respiratory rate of 24 and hypoxic with an oxygen saturation of 91%. She was giving a neb treatment with partial relief. She is having some chest discomfort when she coughs but none at rest. She denies nausea, vomiting, diarrhea, abdominal pain and fever. She has no history of bronchospasm in the past. She is due for dialysis later this morning. She states she is under her normal dry weight.  Past Medical History  Diagnosis Date  . Hyperlipidemia   . Normal cardiac stress test 12/24/2009    lexiscan, imaging normal  . 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 Diverticulitis   . IBS (irritable bowel syndrome)   . Thyroid disease   . Depression   . Chronic kidney disease     Hemo MWF  . GERD (gastroesophageal reflux disease)   . H/O hiatal hernia   . Arthritis   . RLS (restless legs syndrome)   . Tubular adenoma of colon 01/2008  . Seizures ([place])     2004  . Constipation   . Sinus complaint   . Dialysis patient [facility])     kidney  . Renal disorder   . Adrenal mass ([place])   . Hypertension   . High cholesterol   . Back pain   . Meralgia paresthetica of right side 12/26/2014  . Abnormality of gait 03/28/2015   Past Surgical History  Procedure Laterality Date  . Cardiac catheterization  2003    normal  . Cholecystectomy      Open mid line incision  . Frontal craniotomy  2002    indication  = sinusitis  . Appendectomy    . Tubal ligation    . Abdominal hysterectomy    . Insertion of dialysis catheter      Left  . Revison of arteriovenous fistula  05/11/2012    Procedure: REVISON OF ARTERIOVENOUS FISTULA;  Surgeon: Elam Dutch, MD;  Location: [place];  Service: Vascular;  Laterality: Right;  . Av fistula placement Left 11/11/2012    Procedure: INSERTION OF ARTERIOVENOUS (AV) GORE-TEX GRAFT ARM;  Surgeon: Angelia Mould, MD;  Location: [place];  Service: Vascular;  Laterality: Left;  . Avgg removal Left 11/18/2012    Procedure: REMOVAL OF LEFT UPPER ARM ARTERIOVENOUS GORETEX GRAFT ([place]);  Surgeon: Angelia Mould, MD;  Location: [place];  Service: Vascular;  Laterality: Left;  . Patch angioplasty Left 11/18/2012    Procedure: PATCH ANGIOPLASTY;  Surgeon: Angelia Mould, MD;  Location: [place];  Service: Vascular;  Laterality: Left;  . Frontal cranialcity  2002  . Peripheral vascular catheterization Left 03/08/2015    Procedure: A/V Shuntogram/Fistulagram;  Surgeon: Algernon Huxley, MD;  Location: [place] CV LAB;  Service: Cardiovascular;  Laterality: Left;  . Peripheral vascular catheterization Left 03/08/2015    Procedure: A/V Shunt Intervention;  Surgeon: Algernon Huxley, MD;  Location: [place] CV LAB;  Service: Cardiovascular;  Laterality: Left;   Family History  Problem Relation Age of Onset  . Thyroid cancer Mother   . Heart disease Father   . Hypertension Father   . Heart attack Father   . Breast cancer Sister   . Lupus Daughter    Social History  Substance Use Topics  . Smoking status: Current Every Day Smoker -- 1.00 packs/day for 60 years    Types: Cigarettes    Start date: 06/16/2014  . Smokeless tobacco: Never Used     Comment: pt states she smokes "1" cig per day  . Alcohol Use: No   OB History    Gravida Para Term Preterm AB TAB SAB Ectopic Multiple Living   5 3 3             Review of Systems  All other systems reviewed and are  negative.   Allergies  Codeine; Tuberculin tests; Penicillins; and Sulfamethoxazole  Home Medications   Prior to Admission medications   Medication Sig Start Date End Date Taking? Authorizing Provider  ABILIFY 2 MG tablet Take 0.5 tablets (1 mg total) by mouth daily. 11/23/14   Coral Spikes, DO  allopurinol (ZYLOPRIM) 100 MG tablet TAKE ONE TABLET BY MOUTH ONCE DAILY ON  THE  DAYS  FOLLOWING  DIALYSIS  (TUESDAY,  THURSDAY  AND  SATURDAYS) 06/27/14   Tamela Oddi Hess, DO  amLODipine (NORVASC) 10 MG tablet TAKE ONE TABLET BY MOUTH ONCE DAILY 09/25/14   Coral Spikes, DO  aspirin EC 81 MG tablet Take 81 mg by mouth every morning.    Historical Provider, MD  atorvastatin (LIPITOR) 40 MG tablet Take 1 tablet (40 mg total) by mouth daily. 02/26/15   [place] N Rumley, DO  cinacalcet (SENSIPAR) 30 MG tablet Take 30 mg by mouth at bedtime.    Historical Provider, MD  cloNIDine (CATAPRES) 0.1 MG tablet Take 0.1-0.2 mg by mouth 2 (two) times daily. Take 1 tablet in the morning and 2 tablets in the evening    Historical Provider, MD  fluticasone (FLONASE) 50 MCG/ACT nasal spray Place 2 sprays into both nostrils daily as needed for allergies or rhinitis.     Historical Provider, MD  folic acid-vitamin b complex-vitamin c-selenium-zinc (DIALYVITE) 3 MG TABS tablet Take 1 tablet by mouth daily.    Historical Provider, MD  HYDROcodone-acetaminophen (NORCO/VICODIN) 5-325 MG tablet Take 1 tablet by mouth every 6 (six) hours as needed for moderate pain. 04/10/15   Leone Brand, MD  lactulose (CHRONULAC) 10 GM/15ML solution Take 15 mLs (10 g total) by mouth daily. 01/04/15   Mariel Aloe, MD  lidocaine-prilocaine (EMLA) cream Apply 1 application topically 3 (three) times a week. Use before dialysis on MWF    Historical Provider, MD  loratadine (CLARITIN) 10 MG tablet Take 1 tablet (10 mg total) by mouth daily as needed for allergies. 09/27/14   Coral Spikes, DO  losartan (COZAAR) 100 MG tablet Take 1 tablet (100 mg total)  by mouth daily. 09/27/14   Coral Spikes, DO  metoprolol succinate (TOPROL-XL) 100 MG 24 hr tablet Take 100 mg by mouth at bedtime. Take with or immediately following a meal.    Historical Provider, MD  multivitamin (RENA-VIT) TABS tablet Take 1 tablet by mouth at bedtime. 01/01/15   Veatrice Bourbon, MD  omeprazole (PRILOSEC) 20 MG capsule Take 20 mg by mouth 2 (two) times daily.    Historical Provider, MD  pregabalin (LYRICA) 75 MG capsule Take  75 mg by mouth every evening.     Historical Provider, MD  senna (SENOKOT) 8.6 MG tablet Take 2 tablets by mouth daily as needed. For constipation    Historical Provider, MD  sevelamer carbonate (RENVELA) 800 MG tablet Take 2,400 mg by mouth 3 (three) times daily with meals as needed (whenever food is consumed).     Historical Provider, MD   BP 127/50 mmHg  Pulse 73  Temp(Src) 98.5 F (36.9 C) (Oral)  Resp 20  Ht 5\' 1"  (1.549 m)  Wt 172 lb 6.4 oz (78.2 kg)  BMI 32.59 kg/m2  SpO2 98%   Physical Exam  General: Well-developed, well-nourished female in no acute distress; appearance consistent with age of record HENT: normocephalic; atraumatic Eyes: pupils equal, round and reactive to light; extraocular muscles intact Neck: supple Heart: regular rate and rhythm Lungs: Expiratory wheezing throughout; rattly cough Abdomen: soft; nondistended; nontender; no masses or hepatosplenomegaly; bowel sounds present Extremities: No deformity; full range of motion; pulses normal; dialysis graft left upper arm with pulse and thrill Neurologic: Awake, alert and oriented; motor function intact in all extremities and symmetric; no facial droop Skin: Warm and dry Psychiatric: Flat affect    ED Course  Procedures (including critical care time)   EKG Interpretation   Date/Time:  Sunday June 03 2015 23:52:26 EST Ventricular Rate:  75 PR Interval:  184 QRS Duration: 86 QT Interval:  426 QTC Calculation: 476 R Axis:   24 Text Interpretation:  Sinus rhythm No  significant change was found  Confirmed by Florina Ou  MD, Jenny Reichmann ([id number]) on 06/03/2015 11:59:30 PM      MDM   Nursing notes and vitals signs, including pulse oximetry, reviewed.  Summary of this visit's results, reviewed by myself:  Labs:  Results for orders placed or performed during the hospital encounter of 06/03/15 (from the past 24 hour(s))  CBC with Differential/Platelet     Status: Abnormal   Collection Time: 06/04/15 12:40 AM  Result Value Ref Range   WBC 7.0 4.0 - 10.5 K/uL   RBC 3.44 (L) 3.87 - 5.11 MIL/uL   Hemoglobin 9.9 (L) 12.0 - 15.0 g/dL   HCT 31.9 (L) 36.0 - 46.0 %   MCV 92.7 78.0 - 100.0 fL   MCH 28.8 26.0 - 34.0 pg   MCHC 31.0 30.0 - 36.0 g/dL   RDW 17.0 (H) 11.5 - 15.5 %   Platelets 215 150 - 400 K/uL   Neutrophils Relative % 59 %   Neutro Abs 4.2 1.7 - 7.7 K/uL   Lymphocytes Relative 17 %   Lymphs Abs 1.2 0.7 - 4.0 K/uL   Monocytes Relative 16 %   Monocytes Absolute 1.1 (H) 0.1 - 1.0 K/uL   Eosinophils Relative 8 %   Eosinophils Absolute 0.5 0.0 - 0.7 K/uL   Basophils Relative 0 %   Basophils Absolute 0.0 0.0 - 0.1 K/uL  Basic metabolic panel     Status: Abnormal   Collection Time: 06/04/15 12:40 AM  Result Value Ref Range   Sodium 138 135 - 145 mmol/L   Potassium 4.6 3.5 - 5.1 mmol/L   Chloride 103 101 - 111 mmol/L   CO2 24 22 - 32 mmol/L   Glucose, Bld 122 (H) 65 - 99 mg/dL   BUN 42 (H) 6 - 20 mg/dL   Creatinine, Ser 10.79 (H) 0.44 - 1.00 mg/dL   Calcium 8.7 (L) 8.9 - 10.3 mg/dL   GFR calc non Af Amer 3 (L) >60 mL/min  GFR calc Af Amer 4 (L) >60 mL/min   Anion gap 11 5 - 15    Imaging Studies: Dg Chest 2 View  06/04/2015  CLINICAL DATA:  Acute onset of shortness of breath. Initial encounter. EXAM: CHEST  2 VIEW COMPARISON:  Chest radiograph performed 12/31/2014 FINDINGS: The lungs are well-aerated. Vascular congestion is noted. Peribronchial thickening is seen. There is no evidence of focal opacification, pleural effusion or pneumothorax. The heart  is normal in size; the mediastinal contour is within normal limits. No acute osseous abnormalities are seen. A left-sided stent is noted. IMPRESSION: Vascular congestion noted.  Peribronchial thickening seen. Electronically Signed   By: Garald Balding M.D.   On: 06/04/2015 00:47   1:50 AM Patient still wheezing after second breathing treatment but tachypnea has resolved. Oxygen saturation is improved on oxygen by nasal cannula. Family practice to admit at [facility] Dr. Erin Hearing will be the attending physician.    Shanon Rosser, MD 06/04/15 [phone]

## 2015-06-04 NOTE — Progress Notes (Signed)
New Admission Note:   Arrival: From [place] on stretcher Mental Orientation: A&Ox4 Telemetry: None ordered Assessment:  See doc flowsheet Skin: Intact IV: Right forearm IV Pain: None Safety Measures:  Call bell placed within reach; patient instructed on use of call bell and verbalized understanding. Bed in lowest position.  [address] [place] Orientation: Patient oriented to staff, room, and unit. Family: None at bedside  Orders have been reviewed and implemented. Will continue to monitor.  Arlyss Queen, RN, BSN

## 2015-06-05 DIAGNOSIS — I10 Essential (primary) hypertension: Secondary | ICD-10-CM

## 2015-06-05 DIAGNOSIS — J441 Chronic obstructive pulmonary disease with (acute) exacerbation: Principal | ICD-10-CM

## 2015-06-05 DIAGNOSIS — Z992 Dependence on renal dialysis: Secondary | ICD-10-CM

## 2015-06-05 DIAGNOSIS — N186 End stage renal disease: Secondary | ICD-10-CM

## 2015-06-05 LAB — HEPATITIS B SURFACE ANTIGEN: Hepatitis B Surface Ag: NEGATIVE

## 2015-06-05 MED ORDER — PANTOPRAZOLE SODIUM 40 MG PO TBEC
40.0000 mg | DELAYED_RELEASE_TABLET | Freq: Every day | ORAL | Status: DC
  Administered 2015-06-05: 40 mg via ORAL
  Filled 2015-06-05: qty 1

## 2015-06-05 MED ORDER — CLONIDINE HCL 0.1 MG PO TABS
0.1000 mg | ORAL_TABLET | Freq: Two times a day (BID) | ORAL | Status: DC
  Administered 2015-06-05: 0.1 mg via ORAL
  Filled 2015-06-05: qty 1

## 2015-06-05 MED ORDER — FLUCONAZOLE 150 MG PO TABS: 150.0000 mg | ORAL_TABLET | Freq: Every day | ORAL | Status: DC

## 2015-06-05 MED ORDER — ALBUTEROL SULFATE HFA 108 (90 BASE) MCG/ACT IN AERS: 2.0000 | INHALATION_SPRAY | RESPIRATORY_TRACT | Status: DC | PRN

## 2015-06-05 MED ORDER — GI COCKTAIL ~~LOC~~
30.0000 mL | Freq: Once | ORAL | Status: AC
  Administered 2015-06-05: 30 mL via ORAL
  Filled 2015-06-05: qty 30

## 2015-06-05 MED ORDER — IPRATROPIUM BROMIDE HFA 17 MCG/ACT IN AERS: 2.0000 | INHALATION_SPRAY | RESPIRATORY_TRACT | Status: DC | PRN

## 2015-06-05 MED ORDER — DOXYCYCLINE HYCLATE 100 MG PO TABS: 100.0000 mg | ORAL_TABLET | Freq: Two times a day (BID) | ORAL | Status: DC

## 2015-06-05 MED ORDER — PREDNISONE 20 MG PO TABS: 40.0000 mg | ORAL_TABLET | Freq: Every day | ORAL | Status: DC

## 2015-06-05 NOTE — Consult Note (Signed)
   [facility] CM Inpatient Consult   06/05/2015  [name] 06-20-1940 [id number]   Patient evaluated for [place] Management services. Came to bedside to offer and discuss [place] Management services. [name] reports she manages well at home. States she is fully aware of medication and symptom management. States she follows up with her MD closely and goes to HD three days a week. Appreciative of offer. However, she still declines [place] Management program services. Made inpatient RNCM aware patient declined [place] Management. Left [facility] brochure and contact information at bedside for her to call in future if needed.  Marthenia Rolling, MSN-Ed, RN,BSN [facility] Liaison [phone]

## 2015-06-05 NOTE — Progress Notes (Signed)
Family Medicine Teaching Service Daily Progress Note Intern Pager: [phone]  Patient name: [name] Medical record number: [id number] Date of birth: 1941/01/08 Age: 75 y.o. Gender: female  Primary Care Provider: Junie Panning, DO Consultants: renal Code Status: FULL (discussed on admission)  Pt Overview and Major Events to Date:  06/04/15: Admitted  Assessment and Plan: KARLIA SCHLAEFER is a 75 y.o. female presenting with shortness of breath to [facility] . PMH is significant for ESRD on HD, anemia, depression, CHF, GERD, hypertension, HLD, tobacco abuse, gastroparesis  # COPD exacerbation, initial, and hypoxia: Per ED report patient with desaturations down to 91% on RA, increased work of breathing and wheeze. Place on 2 L [place] and O2 improved. No leukocytosis, patient afebrile, VSS. Patient s/p 2 albuterol nebs. Breathing improved after this. CXR with vascular congestion. No evidence of fluid overload on exam. Patient's weight is below dry weight per her. However, she notes that she does not swell when she is retaining fluid - VS per floor protocol - Albuterol as needed wheeze - Doxy (1/2>1/6) - Prednisone (1/2>1/6) - Wean O2 as able - Discussed care with patient and son. - Will need outpatient PFTs, continued smoking cessation  # ESRD on HD. MWF schedule. -2L during HD - c/s Renal for HD, had HD 1/2, plan to increase EDW.  Does not appear to have HD planned again for today contrary to yesterday's note. - continue renal vitamins - SLIV - Renal diet  # hypertension : soft on admission. On Toprol XL 100mg , Cozaar 100mg , Clonidine 0.1 - hold home medications  # HLD: on lipitor - continue lipitor  # Mood disorder: on abilify outpatient - continue home med  # Tobacco use d/o - on nicotine patch - discussed obtaining patches at 1800quitnow.   - follow up with Dr Valentina Lucks outpatient for further support if needed - Discussed at length importance of smoking cessation, esp in  the setting of hospitalization and likely new dx of COPD.  FEN/GI: SLIV, renal diet PPx: Sub -q heparin  Disposition: Discharge home once breathing improved.  Hopefully, today.  Subjective:  Patient reports that she is feeling 80% better.  She notes that she neglected to report that she had been having productive cough for the last 3 months.  She was in denial and only continued to increase smoking as a result.  She reports that she is VERY motivated to quit, esp in the setting of hospitalization.  Denies CP, shortness of breath.  Continues to have cough.  Objective: Temp:  [97 F (36.1 C)-98.8 F (37.1 C)] 98.8 F (37.1 C) (01/03 0500) Pulse Rate:  [68-98] 98 (01/03 0500) Resp:  [16-22] 18 (01/03 0500) BP: (108-170)/(39-78) 170/75 mmHg (01/03 0500) SpO2:  [97 %-100 %] 97 % (01/03 0807) Weight:  [178 lb 12.7 oz (81.1 kg)-183 lb 3.2 oz (83.1 kg)] 178 lb 12.7 oz (81.1 kg) (01/02 1457) Physical Exam: General: awake, alert, sitting up at bedside, NAD Cardiovascular: RRR, no murmurs Respiratory: slight inspiratory and expiratory wheeze, normal work of breathing on room air. Abdomen: soft, NT/ND, +BS Extremities: WWP, no edema  Laboratory:  Recent Labs Lab 06/04/15 0040 06/04/15 0613  WBC 7.0 6.0  HGB 9.9* 10.1*  HCT 31.9* 32.4*  PLT 215 217    Recent Labs Lab 06/04/15 0040 06/04/15 0613  NA 138 142  K 4.6 5.0  CL 103 101  CO2 24 25  BUN 42* 41*  CREATININE 10.79* 10.65*  CALCIUM 8.7* 9.1  GLUCOSE 122* 106*  Imaging/Diagnostic Tests:  Dg Chest 2 View  06/04/2015  CLINICAL DATA:  Acute onset of shortness of breath. Initial encounter. EXAM: CHEST  2 VIEW COMPARISON:  Chest radiograph performed 12/31/2014 FINDINGS: The lungs are well-aerated. Vascular congestion is noted. Peribronchial thickening is seen. There is no evidence of focal opacification, pleural effusion or pneumothorax. The heart is normal in size; the mediastinal contour is within normal limits. No acute  osseous abnormalities are seen. A left-sided stent is noted. IMPRESSION: Vascular congestion noted.  Peribronchial thickening seen. Electronically Signed   By: Garald Balding M.D.   On: 06/04/2015 00:47   Janora Norlander, DO 06/05/2015, 8:39 AM PGY-2, [place] Intern pager: [phone], text pages welcome

## 2015-06-05 NOTE — Progress Notes (Signed)
Provided pt with incentive spirometer and educated on use. Pt verbalized and demonstrated understanding. Instructed pt to use every 2 hours per order. Will continue to monitor.

## 2015-06-05 NOTE — Progress Notes (Signed)
[facility] Progress Note  Assessment/Plan: 1. Acute Bronchitis with Hypoxia: Per primary. No fevers, albuterol and duonebs ordered. Smoking cessation teaching needed. Patient improving today, ordered incentive spirometry.  2. ESRD - MWF at [facility]. Next tx 06/05/15.  3. Hypertension/volume - HD yesterday, net uf 2000. Post wt 81.1 kg. Believe this is more appropriate wt for pt and will raise EDW upon DC. BP elevated, losartan and metoprolol has been resumed.  4. Anemia - HGB 10.1. Last ESA dose 12/18. Will follow HGB.  5. Metabolic bone disease - On Renvela, hectoral and sensipar. Will continue. Increase hectoral to 5 mcg IV q treatment. Question compliance with binders as OP.  6. Nutrition - Renal diet with fld restrictions. Add Nepro/renal vits.  Brianna Favila H. Magdiel Bartles NP-C 06/05/2015, 9:00 AM  [facility] [phone]  Subjective:  "I'm doing better but I don't think I'm getting anything out of that respiratory medicine". States her breathing is better but she is still wheezing. Sitting up in chair on RA talking to son.     Objective Filed Vitals:   06/04/15 2338 06/05/15 0308 06/05/15 0500 06/05/15 0807  BP:   170/75   Pulse:   98   Temp:   98.8 F (37.1 C)   TempSrc:   Oral   Resp:   18   Height:      Weight:      SpO2: 98% 97% 98% 97%   Physical Exam General: Well developed, pleasant, NAD Heart: S1, S2, RRR  Lungs: Bilateral breath sounds decreased in R base, few scattered rhonchi, no wheezing at present Abdomen: soft,nontender, active BS Extremities: No LE edema Dialysis Access: LUA Hero graft + bruit  Dialysis Orders: [facility] MonWedFri, 3 hrs 45 min, 180NRe Optiflux, BFR 400, DFR Autoflow 1.5, EDW 80.5 (kg), Dialysate 2.0 K, 2.0 Ca, UFR Profile: Profile 4, Sodium Model: None, Access: LUA AV Graft-HeRO Heparin: 4000 units per treatment Hectoral: 4 mcg IV q MWF Mircera: 50 mcg IV q 4 weeks (  last dose 05/30/15)  Additional Objective Labs: Basic Metabolic Panel:  Recent Labs Lab 06/04/15 0040 06/04/15 0613  NA 138 142  K 4.6 5.0  CL 103 101  CO2 24 25  GLUCOSE 122* 106*  BUN 42* 41*  CREATININE 10.79* 10.65*  CALCIUM 8.7* 9.1   Liver Function Tests: No results for input(s): AST, ALT, ALKPHOS, BILITOT, PROT, ALBUMIN in the last 168 hours. No results for input(s): LIPASE, AMYLASE in the last 168 hours. CBC:  Recent Labs Lab 06/04/15 0040 06/04/15 0613  WBC 7.0 6.0  NEUTROABS 4.2  --   HGB 9.9* 10.1*  HCT 31.9* 32.4*  MCV 92.7 91.3  PLT 215 217   Blood Culture    Component Value Date/Time   SDES URINE, CATHETERIZED 11/14/2013 1549   SPECREQUEST NONE 11/14/2013 1549   CULT NO GROWTH Performed at [facility] 11/14/2013 1549   REPTSTATUS 11/15/2013 FINAL 11/14/2013 1549    Cardiac Enzymes: No results for input(s): CKTOTAL, CKMB, CKMBINDEX, TROPONINI in the last 168 hours. CBG: No results for input(s): GLUCAP in the last 168 hours. Iron Studies: No results for input(s): IRON, TIBC, TRANSFERRIN, FERRITIN in the last 72 hours. @lablastinr3 @ Studies/Results: Dg Chest 2 View  06/04/2015  CLINICAL DATA:  Acute onset of shortness of breath. Initial encounter. EXAM: CHEST  2 VIEW COMPARISON:  Chest radiograph performed 12/31/2014 FINDINGS: The lungs are well-aerated. Vascular congestion is noted. Peribronchial thickening is seen. There is no evidence  of focal opacification, pleural effusion or pneumothorax. The heart is normal in size; the mediastinal contour is within normal limits. No acute osseous abnormalities are seen. A left-sided stent is noted. IMPRESSION: Vascular congestion noted.  Peribronchial thickening seen. Electronically Signed   By: Garald Balding M.D.   On: 06/04/2015 00:47   Medications:   . ARIPiprazole  1 mg Oral Daily  . aspirin EC  81 mg Oral q morning - 10a  . atorvastatin  40 mg Oral Daily  . cinacalcet  30 mg Oral Q supper   . doxercalciferol  5 mcg Intravenous Q M,W,F-HD  . doxycycline  100 mg Oral Q12H  . feeding supplement (NEPRO CARB STEADY)  237 mL Oral BID BM  . heparin  5,000 Units Subcutaneous 3 times per day  . ipratropium-albuterol  3 mL Nebulization Q4H  . lactulose  10 g Oral Daily  . losartan  100 mg Oral Daily  . metoprolol succinate  100 mg Oral QHS  . multivitamin  1 tablet Oral QHS  . predniSONE  40 mg Oral Q breakfast  . pregabalin  75 mg Oral QPM  . sevelamer carbonate  2,400 mg Oral TID WC

## 2015-06-05 NOTE — Discharge Instructions (Signed)
You were admitted for a COPD exacerbation.  You were started on antibiotics and steroid medications.  Also, you have 2 inhalers to pick up.  You should STOP smoking.  You have follow up with Dr Gerlean Ren and Dr Valentina Lucks.  Please make these appointments.  Chronic Obstructive Pulmonary Disease Chronic obstructive pulmonary disease (COPD) is a common lung condition in which airflow from the lungs is limited. COPD is a general term that can be used to describe many different lung problems that limit airflow, including both chronic bronchitis and emphysema. If you have COPD, your lung function will probably never return to normal, but there are measures you can take to improve lung function and make yourself feel better. CAUSES   Smoking (common).  Exposure to secondhand smoke.  Genetic problems.  Chronic inflammatory lung diseases or recurrent infections. SYMPTOMS  Shortness of breath, especially with physical activity.  Deep, persistent (chronic) cough with a large amount of thick mucus.  Wheezing.  Rapid breaths (tachypnea).  Gray or bluish discoloration (cyanosis) of the skin, especially in your fingers, toes, or lips.  Fatigue.  Weight loss.  Frequent infections or episodes when breathing symptoms become much worse (exacerbations).  Chest tightness. DIAGNOSIS Your health care provider will take a medical history and perform a physical examination to diagnose COPD. Additional tests for COPD may include:  Lung (pulmonary) function tests.  Chest X-ray.  CT scan.  Blood tests. TREATMENT  Treatment for COPD may include:  Inhaler and nebulizer medicines. These help manage the symptoms of COPD and make your breathing more comfortable.  Supplemental oxygen. Supplemental oxygen is only helpful if you have a low oxygen level in your blood.  Exercise and physical activity. These are beneficial for nearly all people with COPD.  Lung surgery or transplant.  Nutrition therapy to gain  weight, if you are underweight.  Pulmonary rehabilitation. This may involve working with a team of health care providers and specialists, such as respiratory, occupational, and physical therapists. HOME CARE INSTRUCTIONS  Take all medicines (inhaled or pills) as directed by your health care provider.  Avoid over-the-counter medicines or cough syrups that dry up your airway (such as antihistamines) and slow down the elimination of secretions unless instructed otherwise by your health care provider.  If you are a smoker, the most important thing that you can do is stop smoking. Continuing to smoke will cause further lung damage and breathing trouble. Ask your health care provider for help with quitting smoking. He or she can direct you to community resources or hospitals that provide support.  Avoid exposure to irritants such as smoke, chemicals, and fumes that aggravate your breathing.  Use oxygen therapy and pulmonary rehabilitation if directed by your health care provider. If you require home oxygen therapy, ask your health care provider whether you should purchase a pulse oximeter to measure your oxygen level at home.  Avoid contact with individuals who have a contagious illness.  Avoid extreme temperature and humidity changes.  Eat healthy foods. Eating smaller, more frequent meals and resting before meals may help you maintain your strength.  Stay active, but balance activity with periods of rest. Exercise and physical activity will help you maintain your ability to do things you want to do.  Preventing infection and hospitalization is very important when you have COPD. Make sure to receive all the vaccines your health care provider recommends, especially the pneumococcal and influenza vaccines. Ask your health care provider whether you need a pneumonia vaccine.  Learn and  use relaxation techniques to manage stress.  Learn and use controlled breathing techniques as directed by your  health care provider. Controlled breathing techniques include:  Pursed lip breathing. Start by breathing in (inhaling) through your nose for 1 second. Then, purse your lips as if you were going to whistle and breathe out (exhale) through the pursed lips for 2 seconds.  Diaphragmatic breathing. Start by putting one hand on your abdomen just above your waist. Inhale slowly through your nose. The hand on your abdomen should move out. Then purse your lips and exhale slowly. You should be able to feel the hand on your abdomen moving in as you exhale.  Learn and use controlled coughing to clear mucus from your lungs. Controlled coughing is a series of short, progressive coughs. The steps of controlled coughing are: 1. Lean your head slightly forward. 2. Breathe in deeply using diaphragmatic breathing. 3. Try to hold your breath for 3 seconds. 4. Keep your mouth slightly open while coughing twice. 5. Spit any mucus out into a tissue. 6. Rest and repeat the steps once or twice as needed. SEEK MEDICAL CARE IF:  You are coughing up more mucus than usual.  There is a change in the color or thickness of your mucus.  Your breathing is more labored than usual.  Your breathing is faster than usual. SEEK IMMEDIATE MEDICAL CARE IF:  You have shortness of breath while you are resting.  You have shortness of breath that prevents you from:  Being able to talk.  Performing your usual physical activities.  You have chest pain lasting longer than 5 minutes.  Your skin color is more cyanotic than usual.  You measure low oxygen saturations for longer than 5 minutes with a pulse oximeter. MAKE SURE YOU:  Understand these instructions.  Will watch your condition.  Will get help right away if you are not doing well or get worse.   This information is not intended to replace advice given to you by your health care provider. Make sure you discuss any questions you have with your health care  provider.   Document Released: 02/26/2005 Document Revised: 06/09/2014 Document Reviewed: 01/13/2013 Elsevier Interactive Patient Education [employer].

## 2015-06-05 NOTE — Progress Notes (Signed)
Pt's BP elevated before discharge, 185/69. Pt states she is just excited about going home and it's normal for her blood pressure to increase. MD notified. Pt instructed to continue taking home medications.

## 2015-06-05 NOTE — Discharge Summary (Signed)
[facility] Discharge Summary  Patient name: [name] Medical record number: [id number] Date of birth: 1940/10/01 Age: 75 y.o. 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 rate 18, height 5\' 1"  (1.549 m), weight 178 lb 12.7 oz (81.1 kg), SpO2 97 %.  Gen: awake, alert, well appearing, breathing well on RA HEENT: poor dentition, MMM Cardio: RRR, no murmurs Pulm: Minimal expiratory wheeze, normal work of breathing, NO longer dyspneic w/ speech Abdomen: soft, NT/ND, +BS Ext: WWP, no edema Skin: no lesions Neuro: no focal deficits, follows all commands Psych: stable mood, speech normal, good eye contact  Brief Hospital Course:  [name] is a 75 y.o. Female that initially presented with shortness of breath to [facility] . PMH is significant for ESRD on HD, anemia, depression, CHF, GERD, hypertension, HLD, tobacco abuse, gastroparesis  She was transferred to the FPTS for further management.  Upon arrival, patient was on 2L [place].  She was afebrile, vitals were stable and she had no leukocytosis.  She was diagnosed a bronchospasm initially.  However, upon further review, patient  admitted to being a long time smoker, so this was thought to be more of a new COPD exacerbation.  Of note, there was no evidence of fluid overload on exam and BNP was 259.5.  She was treated with Doxycycline and Prednisone for this and discharged with 3 remaining days of medication in conjunction with inhalers.  Patient noted that she was breathing somewhat better after inhaled albuterol administered at [facility]. She was weaned to room air by the following day. She was placed on scheduled albuterol, which was later changed to Duonebs.  She responded very well to this.    Renal was consulted for continued HD during hospitalization.  Her estimated dry weight was increased per Renal.  Patient to resume normal scheduled HD sessions at discharge.  She was counseled on smoking cessation and scheduled for outpatient pulmonary function tests at the [facility].  Upon discharge, patient seemed very motivated to quit and planned on calling the [place] quit line for free nicotine patches.  She was discharged in stable condition, work of breathing normal with almost no wheeze on exam.  Teach back method was used to review discharge instructions.  Issues for Follow Up:  1. Completion of antibiotics, steroid burst 2. Breathing, was discharged on Atrovent and Albuterol 3. Will need PFTs, these were scheduled with Dr Valentina Lucks for her.  Please remind her of this appointment 4. Smoking cessation.  May need separate appt with Dr Valentina Lucks for continued support.  Please remind of [id number] for patches.  Significant Procedures: none  Significant Labs and Imaging:   Recent Labs Lab 06/04/15 0040 06/04/15 0613  WBC 7.0 6.0  HGB 9.9* 10.1*  HCT 31.9* 32.4*  PLT 215 217  Recent Labs Lab 06/04/15 0040 06/04/15 0613  NA 138 142  K 4.6 5.0  CL 103 101  CO2 24 25  GLUCOSE 122* 106*  BUN 42* 41*  CREATININE 10.79* 10.65*  CALCIUM 8.7* 9.1   Dg Chest 2 View  06/04/2015  CLINICAL DATA:  Acute onset of  shortness of breath. Initial encounter. EXAM: CHEST  2 VIEW COMPARISON:  Chest radiograph performed 12/31/2014 FINDINGS: The lungs are well-aerated. Vascular congestion is noted. Peribronchial thickening is seen. There is no evidence of focal opacification, pleural effusion or pneumothorax. The heart is normal in size; the mediastinal contour is within normal limits. No acute osseous abnormalities are seen. A left-sided stent is noted. IMPRESSION: Vascular congestion noted.  Peribronchial thickening seen. Electronically Signed   By: Garald Balding M.D.   On: 06/04/2015 00:47    Results/Tests Pending at Time of Discharge: none  Discharge Medications:    Medication List    TAKE these medications        ABILIFY 2 MG tablet  Generic drug:  ARIPiprazole  Take 0.5 tablets (1 mg total) by mouth daily.     albuterol 108 (90 Base) MCG/ACT inhaler  Commonly known as:  PROVENTIL HFA;VENTOLIN HFA  Inhale 2 puffs into the lungs every 4 (four) hours as needed for wheezing or shortness of breath.     allopurinol 100 MG tablet  Commonly known as:  ZYLOPRIM  TAKE ONE TABLET BY MOUTH ONCE DAILY ON  THE  DAYS  FOLLOWING  DIALYSIS  (TUESDAY,  THURSDAY  AND  SATURDAYS)     amLODipine 10 MG tablet  Commonly known as:  NORVASC  TAKE ONE TABLET BY MOUTH ONCE DAILY     aspirin EC 81 MG tablet  Take 81 mg by mouth every morning.     atorvastatin 40 MG tablet  Commonly known as:  LIPITOR  Take 1 tablet (40 mg total) by mouth daily.     cinacalcet 30 MG tablet  Commonly known as:  SENSIPAR  Take 30 mg by mouth at bedtime.     cloNIDine 0.1 MG tablet  Commonly known as:  CATAPRES  Take 0.1-0.2 mg by mouth 2 (two) times daily. Take 1 tablet in the morning and 2 tablets in the evening     doxycycline 100 MG tablet  Commonly known as:  VIBRA-TABS  Take 1 tablet (100 mg total) by mouth every 12 (twelve) hours.     fluconazole 150 MG tablet  Commonly known as:  DIFLUCAN  Take 1 tablet (150 mg total) by  mouth daily. For yeast infection     fluticasone 50 MCG/ACT nasal spray  Commonly known as:  FLONASE  Place 2 sprays into both nostrils daily as needed for allergies or rhinitis.     folic acid-vitamin b complex-vitamin c-selenium-zinc 3 MG Tabs tablet  Take 1 tablet by mouth daily.     ipratropium 17 MCG/ACT inhaler  Commonly known as:  ATROVENT HFA  Inhale 2 puffs into the lungs every 4 (four) hours as needed for wheezing.     lidocaine-prilocaine cream  Commonly known as:  EMLA  Apply 1 application topically 3 (three) times a week. Use before dialysis on MWF     loratadine 10 MG tablet  Commonly known as:  CLARITIN  Take 1 tablet (10 mg total) by mouth daily as needed for allergies.     losartan 100 MG tablet  Commonly known as:  COZAAR  Take 1 tablet (100 mg total) by mouth  daily.     metoprolol succinate 100 MG 24 hr tablet  Commonly known as:  TOPROL-XL  Take 100 mg by mouth at bedtime. Take with or immediately following a meal.     multivitamin Tabs tablet  Take 1 tablet by mouth at bedtime.     omeprazole 20 MG capsule  Commonly known as:  PRILOSEC  Take 20 mg by mouth 2 (two) times daily.     predniSONE 20 MG tablet  Commonly known as:  DELTASONE  Take 2 tablets (40 mg total) by mouth daily with breakfast.     pregabalin 75 MG capsule  Commonly known as:  LYRICA  Take 75 mg by mouth every evening.     senna 8.6 MG tablet  Commonly known as:  SENOKOT  Take 2 tablets by mouth daily as needed. For constipation     sevelamer carbonate 800 MG tablet  Commonly known as:  RENVELA  Take 2,400 mg by mouth 3 (three) times daily with meals as needed (whenever food is consumed).        Discharge Instructions: Please refer to Patient Instructions section of EMR for full details.  Patient was counseled important signs and symptoms that should prompt return to medical care, changes in medications, dietary instructions, activity restrictions, and follow up  appointments.   Follow-Up Appointments: Follow-up Information    Follow up with [facility], DO. Go on 06/08/2015.   Specialty:  Family Medicine   Why:  10:30 am hospital follow up   Contact information:   [address] [place] [address] [phone]       Follow up with [facility]. Go on 06/14/2015.   Why:  Dr Valentina Lucks for pulmonary function tests 11:30am   Contact information:   [place]      Janora Norlander, DO 06/05/2015, 4:53 PM PGY-2, [place]

## 2015-06-06 DIAGNOSIS — D631 Anemia in chronic kidney disease: Secondary | ICD-10-CM | POA: Diagnosis not present

## 2015-06-06 DIAGNOSIS — N2581 Secondary hyperparathyroidism of renal origin: Secondary | ICD-10-CM | POA: Diagnosis not present

## 2015-06-06 DIAGNOSIS — N186 End stage renal disease: Secondary | ICD-10-CM | POA: Diagnosis not present

## 2015-06-06 DIAGNOSIS — E8779 Other fluid overload: Secondary | ICD-10-CM | POA: Diagnosis not present

## 2015-06-08 ENCOUNTER — Ambulatory Visit (INDEPENDENT_AMBULATORY_CARE_PROVIDER_SITE_OTHER): Payer: Medicare Other | Admitting: Family Medicine

## 2015-06-08 ENCOUNTER — Encounter: Payer: Self-pay | Admitting: Family Medicine

## 2015-06-08 VITALS — BP 143/58 | HR 67 | Temp 97.5°F | Ht 61.0 in | Wt 178.1 lb

## 2015-06-08 DIAGNOSIS — J441 Chronic obstructive pulmonary disease with (acute) exacerbation: Secondary | ICD-10-CM | POA: Diagnosis not present

## 2015-06-08 DIAGNOSIS — D631 Anemia in chronic kidney disease: Secondary | ICD-10-CM | POA: Diagnosis not present

## 2015-06-08 DIAGNOSIS — N2581 Secondary hyperparathyroidism of renal origin: Secondary | ICD-10-CM | POA: Diagnosis not present

## 2015-06-08 DIAGNOSIS — R062 Wheezing: Secondary | ICD-10-CM

## 2015-06-08 DIAGNOSIS — E8779 Other fluid overload: Secondary | ICD-10-CM | POA: Diagnosis not present

## 2015-06-08 DIAGNOSIS — N186 End stage renal disease: Secondary | ICD-10-CM | POA: Diagnosis not present

## 2015-06-08 MED ORDER — ALBUTEROL SULFATE (2.5 MG/3ML) 0.083% IN NEBU
2.5000 mg | INHALATION_SOLUTION | Freq: Once | RESPIRATORY_TRACT | Status: AC
  Administered 2015-06-08: 2.5 mg via RESPIRATORY_TRACT

## 2015-06-08 MED ORDER — NICOTINE 14 MG/24HR TD PT24: 14.0000 mg | MEDICATED_PATCH | Freq: Every day | TRANSDERMAL | Status: DC

## 2015-06-08 MED ORDER — IPRATROPIUM BROMIDE 0.02 % IN SOLN
0.5000 mg | Freq: Once | RESPIRATORY_TRACT | Status: AC
  Administered 2015-06-08: 0.5 mg via RESPIRATORY_TRACT

## 2015-06-08 NOTE — Patient Instructions (Signed)
Thank you so much for coming to visit today!  You may take your Atrovent three times daily with the Albuterol as needed throughout the day. Please keep your appointment with the pharmacist so we can test your lungs and get an official diagnosis. I have sent a prescription for nicotine patches to your pharmacy. Please schedule an appointment at the Dermatology clinic for removal of the lesion on your elbow.  Follow up 2-3 weeks following your appointment with [name] so we can review the results of your test and any changes in medication.  Thanks again!

## 2015-06-09 NOTE — Progress Notes (Signed)
Subjective:     Patient ID: [name], female   DOB: [birth year], 75 y.o.   MRN: [id number]  HPI Mrs. Ureste is a 75yo female presenting today for hospital follow up. - Hospitalized from 1/1-06/05/2015 for possible COPD exacerbation. No previous diagnosis of COPD. Discharged with Prednisone and Doxycycline. Also initiated on Atrovent and Albuterol inhalers PRN. - Continues to note wheezing throughout the day and at night. Uses both Atrovent and Albuterol two times a day as needed. Reports greater relief from Atrovent. - Last dose of Doxycycline today. Last dose of prednisone yesterday. - Requests Nicotine patches to help with smoking cessation. - PFT with [facility] scheduled for 06/14/15 - Also reports lesion on left elbow. States she would like this removed. First noted three weeks ago. - Denies fever. Shortness of breath improved.  Review of Systems Per HPI. Other systems negative.    Objective:   Physical Exam  Constitutional: She appears well-developed and well-nourished. No distress.  HENT:  Head: Normocephalic and atraumatic.  Mouth/Throat: No oropharyngeal exudate.  Cardiovascular: Normal rate and regular rhythm.  Exam reveals no gallop and no friction rub.   No murmur heard. Pulmonary/Chest: Effort normal. No respiratory distress. She has wheezes.  Musculoskeletal: She exhibits no edema.  Skin:  Hyperpigmented papular lesion on posterior left elbow  Psychiatric: She has a normal mood and affect. Her behavior is normal.      Assessment and Plan:     COPD exacerbation ([place]) - Improved, however still notes occasional wheezing. - Last dose of antibiotics today. Completed course of steroids. - Will schedule Atrovent TID instead of PRN use. Albuterol PRN. - Follow up with [facility] as scheduled on 06/14/15 for PFT. Note symptoms suspected to be secondary to COPD, but official diagnosis not yet made without PFT. - Nicotine patch prescribed - Follow up after appointment with [facility] to  discuss results

## 2015-06-10 NOTE — Assessment & Plan Note (Signed)
-   Improved, however still notes occasional wheezing. - Last dose of antibiotics today. Completed course of steroids. - Will schedule Atrovent TID instead of PRN use. Albuterol PRN. - Follow up with [facility] as scheduled on 06/14/15 for PFT. Note symptoms suspected to be secondary to COPD, but official diagnosis not yet made without PFT. - Nicotine patch prescribed - Follow up after appointment with [facility] to discuss results

## 2015-06-11 DIAGNOSIS — N186 End stage renal disease: Secondary | ICD-10-CM | POA: Diagnosis not present

## 2015-06-11 DIAGNOSIS — D631 Anemia in chronic kidney disease: Secondary | ICD-10-CM | POA: Diagnosis not present

## 2015-06-11 DIAGNOSIS — E8779 Other fluid overload: Secondary | ICD-10-CM | POA: Diagnosis not present

## 2015-06-11 DIAGNOSIS — N2581 Secondary hyperparathyroidism of renal origin: Secondary | ICD-10-CM | POA: Diagnosis not present

## 2015-06-13 DIAGNOSIS — N2581 Secondary hyperparathyroidism of renal origin: Secondary | ICD-10-CM | POA: Diagnosis not present

## 2015-06-13 DIAGNOSIS — N186 End stage renal disease: Secondary | ICD-10-CM | POA: Diagnosis not present

## 2015-06-13 DIAGNOSIS — D631 Anemia in chronic kidney disease: Secondary | ICD-10-CM | POA: Diagnosis not present

## 2015-06-13 DIAGNOSIS — E8779 Other fluid overload: Secondary | ICD-10-CM | POA: Diagnosis not present

## 2015-06-14 ENCOUNTER — Encounter: Payer: Self-pay | Admitting: Pharmacist

## 2015-06-14 ENCOUNTER — Ambulatory Visit (INDEPENDENT_AMBULATORY_CARE_PROVIDER_SITE_OTHER): Payer: Medicare Other | Admitting: Pharmacist

## 2015-06-14 VITALS — BP 150/59 | HR 70 | Ht 62.0 in | Wt 185.0 lb

## 2015-06-14 DIAGNOSIS — F17200 Nicotine dependence, unspecified, uncomplicated: Secondary | ICD-10-CM | POA: Diagnosis not present

## 2015-06-14 DIAGNOSIS — J441 Chronic obstructive pulmonary disease with (acute) exacerbation: Secondary | ICD-10-CM

## 2015-06-14 NOTE — Assessment & Plan Note (Signed)
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 Post nebulized albuterol tx revealed slight improvement with the use of albuterol.  no change in the treatment plan at this time. Continue ipratropium and albuterol PRN.  Encouraged continued abstinence from smoking. Reviewed results of pulmonary function tests.  Pt verbalized understanding of results and education.   Chronic Tobacco Abuse with recent Tobacco Cessation: Pt quit 05/31/15 and currently remains tobacco free. Discussed smoking cessation with pt. Set pt goal to remain tobacco free.  Pt was confident that could quit with or without nicotine patches but has had success with NRT patch in the past. Pt provided 1800-QUITNOW number for coaching and instructed to call QUITNOW for patches. Pt pharmacy was unable to bill medicaid for patches.  Instructed pt to call clinic if cannot receive patches or needs further assistance remaining tobacco free.

## 2015-06-14 NOTE — Patient Instructions (Addendum)
Thank you for coming in to clinic today.  Congratulations on your recent quit date from smoking cessation on 05/31/15.  We will set a goal to remain tobacco free  Please call the 1-800-QUIT-NOW to try to get patches for smoking cessation.  Please don't hesitate to contact us if you need further assistance.    Continue to use albuterol and ipratropium inhalers as needed for shortness of breath or wheezing.    Please follow up with Dr Gerlean Ren in 2-3 weeks

## 2015-06-14 NOTE — Progress Notes (Signed)
S:    Patient arrives in good spirits, ambulating without assistance.    Presents for lung function evaluation.  Patient reports breathing has been very much improved.  Pt denies wheezing, SOB.  Pt states stopping smoking 14 days ago. Breathing is better than was 2 yrs ago but states breathing is 75% of the best breathing she has had over the past year. Pt states breathing has improved since has quit smoking.  Pt states that she still has a desire to smoke.  Pt states that has 10/10 confidence that she will remain smoke free in 2 weeks.    Patient reports last dose of albuterol/ipratropium was ~7 days ago  58 year smoker.  1.5 ppd of [place] 100 lights. Hx of quitting smoking for 1 month in the past. Pt states that she is at a point where she needs some help. Pt states success with the patch in the past (14 mcg).   Pt states can't walk very far b/c of chronic pain.  Pt states can walk 1/2 block but does have SOB when walking   O: mMRC score= 2 CAT score= 21 See Documentation Flowsheet - CAT/COPD for complete symptom scoring.  See "scanned report" or Documentation Flowsheet (discrete results - PFTs) for  Spirometry results. Patient provided good effort while attempting spirometry.   Albuterol Neb  Lot# [id number] Exp. 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 Post nebulized albuterol tx revealed slight improvement with the use of albuterol.  no change in the treatment plan at this time. Continue ipratropium and albuterol PRN.  Encouraged continued abstinence from smoking. Reviewed results of pulmonary function tests.  Pt verbalized understanding of results and education.   Chronic Tobacco Abuse with recent Tobacco Cessation: Pt quit 05/31/15 and currently remains tobacco free. Discussed smoking cessation with pt. Set pt goal to remain tobacco free.  Pt was confident that could quit with or without nicotine patches but has had success with NRT patch in the past. Pt  provided 1800-QUITNOW number for coaching and instructed to call QUITNOW for patches. Pt pharmacy was unable to bill medicaid for patches.  Instructed pt to call clinic if cannot receive patches or needs further assistance remaining tobacco free.   Written pt instructions provided.  F/U Clinic visit in 2-3 weeks with Dr Gerlean Ren.   Total time in face to face counseling 60 minutes.  Patient seen with Phillis Knack, PharmD Candidate, Bennye Alm, PharmD Resident, and Elisabeth Most, PharmD Resident.

## 2015-06-14 NOTE — Progress Notes (Signed)
Patient ID: [name], female   DOB: 04/06/1941, 75 y.o.   MRN: [id number] Reviewed: Agree with Dr. Graylin Shiver documentation and management.

## 2015-06-14 NOTE — Assessment & Plan Note (Signed)
Chronic Tobacco Abuse with recent Tobacco Cessation: Pt quit 05/31/15 and currently remains tobacco free. Discussed smoking cessation with pt. Set pt goal to remain tobacco free.  Pt was confident that could quit with or without nicotine patches but has had success with NRT patch in the past. Pt provided 1800-QUITNOW number for coaching and instructed to call QUITNOW for patches. Pt pharmacy was unable to bill medicaid for patches.  Instructed pt to call clinic if cannot receive patches or needs further assistance remaining tobacco free.

## 2015-06-15 ENCOUNTER — Other Ambulatory Visit: Payer: Self-pay | Admitting: Family Medicine

## 2015-06-15 ENCOUNTER — Encounter: Payer: Self-pay | Admitting: Family Medicine

## 2015-06-15 ENCOUNTER — Other Ambulatory Visit (HOSPITAL_COMMUNITY)
Admission: RE | Admit: 2015-06-15 | Discharge: 2015-06-15 | Disposition: A | Discharge status: Home / Self Care | Payer: Medicare Other | Source: Ambulatory Visit | Attending: Family Medicine | Admitting: Family Medicine

## 2015-06-15 ENCOUNTER — Ambulatory Visit (INDEPENDENT_AMBULATORY_CARE_PROVIDER_SITE_OTHER): Payer: Medicare Other | Admitting: Family Medicine

## 2015-06-15 VITALS — BP 151/56 | HR 74 | Temp 97.8°F | Ht 61.0 in | Wt 186.4 lb

## 2015-06-15 DIAGNOSIS — R198 Other specified symptoms and signs involving the digestive system and abdomen: Secondary | ICD-10-CM | POA: Diagnosis not present

## 2015-06-15 DIAGNOSIS — E8779 Other fluid overload: Secondary | ICD-10-CM | POA: Diagnosis not present

## 2015-06-15 DIAGNOSIS — L989 Disorder of the skin and subcutaneous tissue, unspecified: Secondary | ICD-10-CM

## 2015-06-15 DIAGNOSIS — D631 Anemia in chronic kidney disease: Secondary | ICD-10-CM | POA: Diagnosis not present

## 2015-06-15 DIAGNOSIS — N186 End stage renal disease: Secondary | ICD-10-CM | POA: Diagnosis not present

## 2015-06-15 DIAGNOSIS — R3989 Other symptoms and signs involving the genitourinary system: Secondary | ICD-10-CM

## 2015-06-15 DIAGNOSIS — Z1159 Encounter for screening for other viral diseases: Secondary | ICD-10-CM | POA: Diagnosis not present

## 2015-06-15 DIAGNOSIS — Z113 Encounter for screening for infections with a predominantly sexual mode of transmission: Secondary | ICD-10-CM | POA: Insufficient documentation

## 2015-06-15 DIAGNOSIS — N766 Ulceration of vulva: Secondary | ICD-10-CM

## 2015-06-15 DIAGNOSIS — N2581 Secondary hyperparathyroidism of renal origin: Secondary | ICD-10-CM | POA: Diagnosis not present

## 2015-06-15 DIAGNOSIS — Z114 Encounter for screening for human immunodeficiency virus [HIV]: Secondary | ICD-10-CM

## 2015-06-15 LAB — POCT WET PREP (WET MOUNT): CLUE CELLS WET PREP WHIFF POC: NEGATIVE

## 2015-06-15 NOTE — Patient Instructions (Signed)
Thank you so much for coming to visit today! We are checking several labs today and I will contact you with the results. We also obtained a biopsy from your left elbow. I will let you know what it comes back as.  Thanks again! Dr. Gerlean Ren

## 2015-06-16 DIAGNOSIS — A6000 Herpesviral infection of urogenital system, unspecified: Secondary | ICD-10-CM | POA: Insufficient documentation

## 2015-06-16 DIAGNOSIS — L989 Disorder of the skin and subcutaneous tissue, unspecified: Secondary | ICD-10-CM | POA: Insufficient documentation

## 2015-06-16 LAB — HIV ANTIBODY (ROUTINE TESTING W REFLEX): HIV 1&2 Ab, 4th Generation: NONREACTIVE

## 2015-06-16 LAB — RPR

## 2015-06-16 NOTE — Progress Notes (Signed)
Subjective:     Patient ID: [name], female   DOB: [birth year], 75 y.o.   MRN: [id number]  HPI Mrs. Coolidge is a 75yo female presenting for skin lesion on left elbow. Would also like to discuss vaginal swelling. # Skin Lesion Left Elbow: - First noted four weeks ago - Has been increasing in size - Tender - Denies itching - Scheduled today's visit for anticipated removal  # Vaginal Swelling - Notes increased tenderness and swelling of left labia - Not currently sexually active - States completed course of Diflucan. Symptoms started shortly after completed course. - Denies fever - Not currently sexually active. - Denies history of STDs  - Previous smoker. Currently on Nicotine patch.  Review of Systems Per HPI. Otherwise negative.    Objective:   Physical Exam  Constitutional: She appears well-developed and well-nourished.  Genitourinary:  No lymphadenopathy noted. No discharge. Prolapse noted.  Skin:  Papular lesion with hyperpigmented base noted on left elbow, 33mm. Genital ulcer noted on left labia, tender to palpation. No Bartholin's cysts palpable.  Psychiatric: She has a normal mood and affect. Her behavior is normal.      Assessment and Plan:     Skin lesion - Suspect wart, however somewhat suspicious appearance. - Given option of shave biopsy vs. Cryotherapy. Would like shave biopsy to receive official diagnosis - Procedure note below.  PROCEDURE NOTE: Procedure: Shave Biopsy Site: Left Elbow Written consent obtained after discussion of risks, benefits, indications, and alternatives. Area sterilized with 1.5cc lidocaine with epinephrine Area of concern removed with razor Sample placed in labeled pathology container. Sent to pathologist for review. Bleeding controlled with pressure. Bandage applied.  Genital ulcer, female - STD labs - If STD screen negative and lesion remains, consider biopsy

## 2015-06-16 NOTE — Assessment & Plan Note (Signed)
-   STD labs - If STD screen negative and lesion remains, consider biopsy

## 2015-06-16 NOTE — Assessment & Plan Note (Signed)
-   Suspect wart, however somewhat suspicious appearance. - Given option of shave biopsy vs. Cryotherapy. Would like shave biopsy to receive official diagnosis - Procedure note below.  PROCEDURE NOTE: Procedure: Shave Biopsy Site: Left Elbow Written consent obtained after discussion of risks, benefits, indications, and alternatives. Area sterilized with 1.5cc lidocaine with epinephrine Area of concern removed with razor Sample placed in labeled pathology container. Sent to pathologist for review. Bleeding controlled with pressure. Bandage applied.

## 2015-06-18 DIAGNOSIS — E8779 Other fluid overload: Secondary | ICD-10-CM | POA: Diagnosis not present

## 2015-06-18 DIAGNOSIS — D631 Anemia in chronic kidney disease: Secondary | ICD-10-CM | POA: Diagnosis not present

## 2015-06-18 DIAGNOSIS — N2581 Secondary hyperparathyroidism of renal origin: Secondary | ICD-10-CM | POA: Diagnosis not present

## 2015-06-18 DIAGNOSIS — N186 End stage renal disease: Secondary | ICD-10-CM | POA: Diagnosis not present

## 2015-06-18 LAB — HSV 2 ANTIBODY, IGG: HSV 2 Glycoprotein G Ab, IgG: 11.38 IV — ABNORMAL HIGH

## 2015-06-18 LAB — CERVICOVAGINAL ANCILLARY ONLY
CHLAMYDIA, DNA PROBE: NEGATIVE
Neisseria Gonorrhea: NEGATIVE

## 2015-06-18 LAB — HSV 1 ANTIBODY, IGG: HSV 1 GLYCOPROTEIN G AB, IGG: 7.49 IV — AB

## 2015-06-18 LAB — HERPES SIMPLEX VIRUS CULTURE: Organism ID, Bacteria: DETECTED

## 2015-06-19 ENCOUNTER — Telehealth: Payer: Self-pay | Admitting: Family Medicine

## 2015-06-19 DIAGNOSIS — B009 Herpesviral infection, unspecified: Secondary | ICD-10-CM

## 2015-06-19 MED ORDER — VALACYCLOVIR HCL 1 G PO TABS: 1000.0000 mg | ORAL_TABLET | Freq: Two times a day (BID) | ORAL | Status: DC

## 2015-06-19 NOTE — Telephone Encounter (Signed)
Contacted concerning results of labs indicating diagnosis of Herpes. Prescription for Valtrex sent to pharmacy to complete ten day course. Not currently sexually active, but encouraged to let partners know if she becomes active.

## 2015-06-20 DIAGNOSIS — D631 Anemia in chronic kidney disease: Secondary | ICD-10-CM | POA: Diagnosis not present

## 2015-06-20 DIAGNOSIS — E8779 Other fluid overload: Secondary | ICD-10-CM | POA: Diagnosis not present

## 2015-06-20 DIAGNOSIS — N2581 Secondary hyperparathyroidism of renal origin: Secondary | ICD-10-CM | POA: Diagnosis not present

## 2015-06-20 DIAGNOSIS — N186 End stage renal disease: Secondary | ICD-10-CM | POA: Diagnosis not present

## 2015-06-21 ENCOUNTER — Encounter: Payer: Self-pay | Admitting: Family Medicine

## 2015-06-21 ENCOUNTER — Ambulatory Visit (INDEPENDENT_AMBULATORY_CARE_PROVIDER_SITE_OTHER): Payer: Medicare Other | Admitting: Family Medicine

## 2015-06-21 VITALS — BP 148/52 | HR 68 | Temp 98.4°F | Wt 186.0 lb

## 2015-06-21 DIAGNOSIS — N766 Ulceration of vulva: Secondary | ICD-10-CM

## 2015-06-21 DIAGNOSIS — T887XXA Unspecified adverse effect of drug or medicament, initial encounter: Secondary | ICD-10-CM

## 2015-06-21 DIAGNOSIS — T50905A Adverse effect of unspecified drugs, medicaments and biological substances, initial encounter: Secondary | ICD-10-CM | POA: Insufficient documentation

## 2015-06-21 NOTE — Assessment & Plan Note (Signed)
Likely symptoms come from adverse reaction to valacyclovir.  Alternatively, could come from emotional upset of hearing dx.  Stop valacyclovir and anticipate clearing.

## 2015-06-21 NOTE — Assessment & Plan Note (Signed)
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  Single ulcer and no systemic symptoms.  Given adverse reaction, will stop valacyclovir and not treat.  Lesion should clear on its own in 3-7 days.  Educated.  This does not speak poorly to her character.

## 2015-06-21 NOTE — Patient Instructions (Addendum)
I believe it is the medication that is making you feel strange.  Please stop the valacyclovir. This is not a bad infection and will go away on its own in a few more days.  It may come out in the future again at some time. Call me on Monday if you are still nervous or not feeling well. I always enjoy seeing you.  You are a good person.

## 2015-06-21 NOTE — Progress Notes (Signed)
   Subjective:    Patient ID: [name], female    DOB: 1941/05/12, 75 y.o.   MRN: [id number]  HPI CC confusion, nervousness.  Was ill and in hospital.  Discharged and recovering uneventfully.  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  Not sexually active.  Does not recall previous painful ulcers.  This is new dx of herpes genitalis.    Today, awoke moderately confused and very nervous.  EMS called.  Normal VS.  Refused transport to hospital.  When she went to take her morning meds, she wondered appropriately if valacyclovir might be contributing.  Slightly nervous.  Does not feel confused.  Mildly upset over information of this STD.  She thinks it speaks poorly of her character.    Review of Systems     Objective:   Physical Exam VS reviewed and normal Neuro, WNL       Assessment & Plan:

## 2015-06-22 DIAGNOSIS — N186 End stage renal disease: Secondary | ICD-10-CM | POA: Diagnosis not present

## 2015-06-22 DIAGNOSIS — E8779 Other fluid overload: Secondary | ICD-10-CM | POA: Diagnosis not present

## 2015-06-22 DIAGNOSIS — D631 Anemia in chronic kidney disease: Secondary | ICD-10-CM | POA: Diagnosis not present

## 2015-06-22 DIAGNOSIS — N2581 Secondary hyperparathyroidism of renal origin: Secondary | ICD-10-CM | POA: Diagnosis not present

## 2015-06-23 DIAGNOSIS — E8779 Other fluid overload: Secondary | ICD-10-CM | POA: Diagnosis not present

## 2015-06-23 DIAGNOSIS — N186 End stage renal disease: Secondary | ICD-10-CM | POA: Diagnosis not present

## 2015-06-23 DIAGNOSIS — D631 Anemia in chronic kidney disease: Secondary | ICD-10-CM | POA: Diagnosis not present

## 2015-06-23 DIAGNOSIS — N2581 Secondary hyperparathyroidism of renal origin: Secondary | ICD-10-CM | POA: Diagnosis not present

## 2015-06-25 DIAGNOSIS — N186 End stage renal disease: Secondary | ICD-10-CM | POA: Diagnosis not present

## 2015-06-25 DIAGNOSIS — D631 Anemia in chronic kidney disease: Secondary | ICD-10-CM | POA: Diagnosis not present

## 2015-06-25 DIAGNOSIS — N2581 Secondary hyperparathyroidism of renal origin: Secondary | ICD-10-CM | POA: Diagnosis not present

## 2015-06-25 DIAGNOSIS — E8779 Other fluid overload: Secondary | ICD-10-CM | POA: Diagnosis not present

## 2015-06-27 DIAGNOSIS — N2581 Secondary hyperparathyroidism of renal origin: Secondary | ICD-10-CM | POA: Diagnosis not present

## 2015-06-27 DIAGNOSIS — D631 Anemia in chronic kidney disease: Secondary | ICD-10-CM | POA: Diagnosis not present

## 2015-06-27 DIAGNOSIS — N186 End stage renal disease: Secondary | ICD-10-CM | POA: Diagnosis not present

## 2015-06-27 DIAGNOSIS — E8779 Other fluid overload: Secondary | ICD-10-CM | POA: Diagnosis not present

## 2015-06-29 DIAGNOSIS — N2581 Secondary hyperparathyroidism of renal origin: Secondary | ICD-10-CM | POA: Diagnosis not present

## 2015-06-29 DIAGNOSIS — N186 End stage renal disease: Secondary | ICD-10-CM | POA: Diagnosis not present

## 2015-06-29 DIAGNOSIS — E8779 Other fluid overload: Secondary | ICD-10-CM | POA: Diagnosis not present

## 2015-06-29 DIAGNOSIS — D631 Anemia in chronic kidney disease: Secondary | ICD-10-CM | POA: Diagnosis not present

## 2015-07-02 DIAGNOSIS — N186 End stage renal disease: Secondary | ICD-10-CM | POA: Diagnosis not present

## 2015-07-02 DIAGNOSIS — E8779 Other fluid overload: Secondary | ICD-10-CM | POA: Diagnosis not present

## 2015-07-02 DIAGNOSIS — D631 Anemia in chronic kidney disease: Secondary | ICD-10-CM | POA: Diagnosis not present

## 2015-07-02 DIAGNOSIS — N2581 Secondary hyperparathyroidism of renal origin: Secondary | ICD-10-CM | POA: Diagnosis not present

## 2015-07-03 DIAGNOSIS — E1129 Type 2 diabetes mellitus with other diabetic kidney complication: Secondary | ICD-10-CM | POA: Diagnosis not present

## 2015-07-03 DIAGNOSIS — N186 End stage renal disease: Secondary | ICD-10-CM | POA: Diagnosis not present

## 2015-07-03 DIAGNOSIS — Z992 Dependence on renal dialysis: Secondary | ICD-10-CM | POA: Diagnosis not present

## 2015-07-04 DIAGNOSIS — D631 Anemia in chronic kidney disease: Secondary | ICD-10-CM | POA: Diagnosis not present

## 2015-07-04 DIAGNOSIS — N186 End stage renal disease: Secondary | ICD-10-CM | POA: Diagnosis not present

## 2015-07-04 DIAGNOSIS — N2581 Secondary hyperparathyroidism of renal origin: Secondary | ICD-10-CM | POA: Diagnosis not present

## 2015-07-06 DIAGNOSIS — N186 End stage renal disease: Secondary | ICD-10-CM | POA: Diagnosis not present

## 2015-07-06 DIAGNOSIS — D631 Anemia in chronic kidney disease: Secondary | ICD-10-CM | POA: Diagnosis not present

## 2015-07-06 DIAGNOSIS — N2581 Secondary hyperparathyroidism of renal origin: Secondary | ICD-10-CM | POA: Diagnosis not present

## 2015-07-09 DIAGNOSIS — N186 End stage renal disease: Secondary | ICD-10-CM | POA: Diagnosis not present

## 2015-07-09 DIAGNOSIS — N2581 Secondary hyperparathyroidism of renal origin: Secondary | ICD-10-CM | POA: Diagnosis not present

## 2015-07-09 DIAGNOSIS — D631 Anemia in chronic kidney disease: Secondary | ICD-10-CM | POA: Diagnosis not present

## 2015-07-10 ENCOUNTER — Ambulatory Visit: Payer: Medicare Other | Admitting: Gastroenterology

## 2015-07-11 DIAGNOSIS — N2581 Secondary hyperparathyroidism of renal origin: Secondary | ICD-10-CM | POA: Diagnosis not present

## 2015-07-11 DIAGNOSIS — N186 End stage renal disease: Secondary | ICD-10-CM | POA: Diagnosis not present

## 2015-07-11 DIAGNOSIS — D631 Anemia in chronic kidney disease: Secondary | ICD-10-CM | POA: Diagnosis not present

## 2015-07-12 ENCOUNTER — Ambulatory Visit (INDEPENDENT_AMBULATORY_CARE_PROVIDER_SITE_OTHER): Payer: Medicare Other | Admitting: Gastroenterology

## 2015-07-12 ENCOUNTER — Encounter: Payer: Self-pay | Admitting: Gastroenterology

## 2015-07-12 VITALS — BP 112/60 | HR 64 | Ht 61.0 in | Wt 187.0 lb

## 2015-07-12 DIAGNOSIS — Z86010 Personal history of colonic polyps: Secondary | ICD-10-CM

## 2015-07-12 DIAGNOSIS — R159 Full incontinence of feces: Secondary | ICD-10-CM

## 2015-07-12 MED ORDER — NA SULFATE-K SULFATE-MG SULF 17.5-3.13-1.6 GM/177ML PO SOLN: ORAL | Status: DC

## 2015-07-12 NOTE — Patient Instructions (Signed)
You have been scheduled for a colonoscopy. Please follow written instructions given to you at your visit today.  Please pick up your prep supplies at the pharmacy within the next 1-3 days. If you use inhalers (even only as needed), please bring them with you on the day of your procedure. Your physician has requested that you go to www.startemmi.com and enter the access code given to you at your visit today. This web site gives a general overview about your procedure. However, you should still follow specific instructions given to you by our office regarding your preparation for the procedure. ____________________________________________________________________________________________________________________________________________________________________________________________________  You have been scheduled to have an anorectal manometry at [facility] on Wednesday, 07/25/15 at 8:30 am. Please arrive 30 minutes prior to your appointment time for registration (1st floor of the hospital-admissions).  Please make certain to use 1 Fleets enema 2 hours prior to coming for your appointment. You can purchase Fleets enemas from the laxative section at your drug store. You should not eat anything during the two hours prior to the procedure. You may take regular medications with small sips of water at least 2 hours prior to the study.  Anorectal manometry is a test performed to evaluate patients with constipation or fecal incontinence. This test measures the pressures of the anal sphincter muscles, the sensation in the rectum, and the neural reflexes that are needed for normal bowel movements.  THE PROCEDURE The test takes approximately 30 minutes to 1 hour. You will be asked to change into a hospital gown. A technician or nurse will explain the procedure to you, take a brief health history, and answer any questions you may have. The patient then lies on his or her left side. A small, flexible tube,  about the size of a thermometer, with a balloon at the end is inserted into the rectum. The catheter is connected to a machine that measures the pressure. During the test, the small balloon attached to the catheter may be inflated in the rectum to assess the normal reflex pathways. The nurse or technician may also ask the person to squeeze, relax, and push at various times. The anal sphincter muscle pressures are measured during each of these maneuvers. To squeeze, the patient tightens the sphincter muscles as if trying to prevent anything from coming out. To push or bear down, the patient strains down as if trying to have a bowel movement.

## 2015-07-12 NOTE — Progress Notes (Signed)
    History of Present Illness: This is a 75 year old female here for the evaluation of ongoing fecal incontinence. She was evaluated for the same problem in 2015-please see that note. Dr. Jannifer Franklin is her neurologist and she has a peripheral neuropathy. She has episodes of solid stool incontinence about a couple times per month for 3 or more years. She has no warning or sensation. She generally has one normal formed bowel movement each day. She has a prior history of adenomatous colon polyps and is overdue for surveillance colonoscopy.  Review of Systems: Pertinent positive and negative review of systems were noted in the above HPI section. All other review of systems were otherwise negative.  Current Medications, Allergies, Past Medical History, Past Surgical History, Family History and Social History were reviewed in [employer] record.  Physical Exam: General: Well developed, well nourished, no acute distress Head: Normocephalic and atraumatic Eyes:  sclerae anicteric, EOMI Ears: Normal auditory acuity Mouth: No deformity or lesions Neck: Supple, no masses or thyromegaly Lungs: Clear throughout to auscultation Heart: Regular rate and rhythm; no murmurs, rubs or bruits Abdomen: Soft, non tender and non distended. No masses, hepatosplenomegaly or hernias noted. Normal Bowel sounds Rectal: markedly decreased sphincter tone, little/no anal squeeze, heme neg stool Musculoskeletal: Symmetrical with no gross deformities  Skin: No lesions on visible extremities Pulses:  Normal pulses noted Extremities: No clubbing, cyanosis, edema or deformities noted Neurological: Alert oriented x 4, grossly nonfocal Cervical Nodes:  No significant cervical adenopathy Inguinal Nodes: No significant inguinal adenopathy Psychological:  Alert and cooperative. Normal mood and affect  Assessment and Recommendations:  1. Fecal incontinence. Markedly decreased sphincter and little/no anal  squeeze. Rule out neuropathic, musclar process. Begin Kegel excercizes. Schedule anorectal manometry with Dr. Silverio Decamp. She is advised to sit on the commode 4-5 times a day to attempt to evacuate before episodes of incontinence.   2. Personal history of adenomatous colon polyps. Schedule colonoscopy. The risks (including bleeding, perforation, infection, missed lesions, medication reactions and possible hospitalization or surgery if complications occur), benefits, and alternatives to colonoscopy with possible biopsy and possible polypectomy were discussed with the patient and they consent to proceed.   3. Diverticulosis.   4. ESRD on hemodialysis.   5. GERD with large hiatal hernia. Continue omeprazole 20 mg po bid and antireflux measures.

## 2015-07-13 DIAGNOSIS — D631 Anemia in chronic kidney disease: Secondary | ICD-10-CM | POA: Diagnosis not present

## 2015-07-13 DIAGNOSIS — N186 End stage renal disease: Secondary | ICD-10-CM | POA: Diagnosis not present

## 2015-07-13 DIAGNOSIS — N2581 Secondary hyperparathyroidism of renal origin: Secondary | ICD-10-CM | POA: Diagnosis not present

## 2015-07-16 DIAGNOSIS — D631 Anemia in chronic kidney disease: Secondary | ICD-10-CM | POA: Diagnosis not present

## 2015-07-16 DIAGNOSIS — N186 End stage renal disease: Secondary | ICD-10-CM | POA: Diagnosis not present

## 2015-07-16 DIAGNOSIS — N2581 Secondary hyperparathyroidism of renal origin: Secondary | ICD-10-CM | POA: Diagnosis not present

## 2015-07-17 ENCOUNTER — Other Ambulatory Visit: Payer: Self-pay | Admitting: Family Medicine

## 2015-07-17 NOTE — Telephone Encounter (Signed)
This is your patient, see med refill 

## 2015-07-18 DIAGNOSIS — N2581 Secondary hyperparathyroidism of renal origin: Secondary | ICD-10-CM | POA: Diagnosis not present

## 2015-07-18 DIAGNOSIS — N186 End stage renal disease: Secondary | ICD-10-CM | POA: Diagnosis not present

## 2015-07-18 DIAGNOSIS — D631 Anemia in chronic kidney disease: Secondary | ICD-10-CM | POA: Diagnosis not present

## 2015-07-20 DIAGNOSIS — D631 Anemia in chronic kidney disease: Secondary | ICD-10-CM | POA: Diagnosis not present

## 2015-07-20 DIAGNOSIS — N2581 Secondary hyperparathyroidism of renal origin: Secondary | ICD-10-CM | POA: Diagnosis not present

## 2015-07-20 DIAGNOSIS — N186 End stage renal disease: Secondary | ICD-10-CM | POA: Diagnosis not present

## 2015-07-23 DIAGNOSIS — N186 End stage renal disease: Secondary | ICD-10-CM | POA: Diagnosis not present

## 2015-07-23 DIAGNOSIS — N2581 Secondary hyperparathyroidism of renal origin: Secondary | ICD-10-CM | POA: Diagnosis not present

## 2015-07-23 DIAGNOSIS — D631 Anemia in chronic kidney disease: Secondary | ICD-10-CM | POA: Diagnosis not present

## 2015-07-25 ENCOUNTER — Ambulatory Visit (HOSPITAL_COMMUNITY)
Admission: RE | Admit: 2015-07-25 | Discharge: 2015-07-25 | Disposition: A | Discharge status: Home / Self Care | Payer: Medicare Other | Source: Ambulatory Visit | Attending: Gastroenterology | Admitting: Gastroenterology

## 2015-07-25 ENCOUNTER — Encounter (HOSPITAL_COMMUNITY)
Admission: RE | Disposition: A | Discharge status: Home / Self Care | Payer: Self-pay | Source: Ambulatory Visit | Attending: Gastroenterology

## 2015-07-25 DIAGNOSIS — R159 Full incontinence of feces: Secondary | ICD-10-CM | POA: Diagnosis not present

## 2015-07-25 DIAGNOSIS — K6289 Other specified diseases of anus and rectum: Secondary | ICD-10-CM | POA: Insufficient documentation

## 2015-07-25 DIAGNOSIS — N186 End stage renal disease: Secondary | ICD-10-CM | POA: Diagnosis not present

## 2015-07-25 DIAGNOSIS — D631 Anemia in chronic kidney disease: Secondary | ICD-10-CM | POA: Diagnosis not present

## 2015-07-25 DIAGNOSIS — N2581 Secondary hyperparathyroidism of renal origin: Secondary | ICD-10-CM | POA: Diagnosis not present

## 2015-07-25 HISTORY — PX: ANAL RECTAL MANOMETRY: SHX6358

## 2015-07-25 SURGERY — MANOMETRY, ANORECTAL

## 2015-07-26 ENCOUNTER — Encounter (HOSPITAL_COMMUNITY): Payer: Self-pay | Admitting: Gastroenterology

## 2015-07-27 DIAGNOSIS — N186 End stage renal disease: Secondary | ICD-10-CM | POA: Diagnosis not present

## 2015-07-27 DIAGNOSIS — D631 Anemia in chronic kidney disease: Secondary | ICD-10-CM | POA: Diagnosis not present

## 2015-07-27 DIAGNOSIS — N2581 Secondary hyperparathyroidism of renal origin: Secondary | ICD-10-CM | POA: Diagnosis not present

## 2015-07-28 ENCOUNTER — Other Ambulatory Visit: Payer: Self-pay | Admitting: Family Medicine

## 2015-07-30 DIAGNOSIS — N2581 Secondary hyperparathyroidism of renal origin: Secondary | ICD-10-CM | POA: Diagnosis not present

## 2015-07-30 DIAGNOSIS — D631 Anemia in chronic kidney disease: Secondary | ICD-10-CM | POA: Diagnosis not present

## 2015-07-30 DIAGNOSIS — N186 End stage renal disease: Secondary | ICD-10-CM | POA: Diagnosis not present

## 2015-07-30 NOTE — Telephone Encounter (Signed)
No medications listed for refill. Please signify which medication is being requested.

## 2015-07-30 NOTE — Telephone Encounter (Signed)
This is your patient, see med refill request 

## 2015-07-31 DIAGNOSIS — N186 End stage renal disease: Secondary | ICD-10-CM | POA: Diagnosis not present

## 2015-07-31 DIAGNOSIS — Z992 Dependence on renal dialysis: Secondary | ICD-10-CM | POA: Diagnosis not present

## 2015-07-31 DIAGNOSIS — E1129 Type 2 diabetes mellitus with other diabetic kidney complication: Secondary | ICD-10-CM | POA: Diagnosis not present

## 2015-08-01 DIAGNOSIS — D631 Anemia in chronic kidney disease: Secondary | ICD-10-CM | POA: Diagnosis not present

## 2015-08-01 DIAGNOSIS — R159 Full incontinence of feces: Secondary | ICD-10-CM | POA: Insufficient documentation

## 2015-08-01 DIAGNOSIS — N2581 Secondary hyperparathyroidism of renal origin: Secondary | ICD-10-CM | POA: Diagnosis not present

## 2015-08-01 DIAGNOSIS — D509 Iron deficiency anemia, unspecified: Secondary | ICD-10-CM | POA: Diagnosis not present

## 2015-08-01 DIAGNOSIS — N186 End stage renal disease: Secondary | ICD-10-CM | POA: Diagnosis not present

## 2015-08-02 ENCOUNTER — Telehealth: Payer: Self-pay | Admitting: Neurology

## 2015-08-02 NOTE — Telephone Encounter (Signed)
Pt called and says that she has had an increase in spasms in her legs from 4-5p through the night and into the early morning for the last 4 weeks. she says that she takes lyrica and it is not helping. She does not know if she should be seen or who should see her about this. Pt says she has burning through her legs and feet.  Please call and advise [phone], cell [phone]

## 2015-08-02 NOTE — Telephone Encounter (Signed)
PC with pt.  Pending appt. is in May. I have given sooner appt. to discuss increased leg pain--08-07-15 at 8am, to arrive 10 min. early/fim

## 2015-08-06 DIAGNOSIS — D631 Anemia in chronic kidney disease: Secondary | ICD-10-CM | POA: Diagnosis not present

## 2015-08-06 DIAGNOSIS — N2581 Secondary hyperparathyroidism of renal origin: Secondary | ICD-10-CM | POA: Diagnosis not present

## 2015-08-06 DIAGNOSIS — D509 Iron deficiency anemia, unspecified: Secondary | ICD-10-CM | POA: Diagnosis not present

## 2015-08-06 DIAGNOSIS — N186 End stage renal disease: Secondary | ICD-10-CM | POA: Diagnosis not present

## 2015-08-07 ENCOUNTER — Ambulatory Visit (INDEPENDENT_AMBULATORY_CARE_PROVIDER_SITE_OTHER): Payer: Medicare Other | Admitting: Neurology

## 2015-08-07 ENCOUNTER — Encounter: Payer: Self-pay | Admitting: Neurology

## 2015-08-07 VITALS — BP 186/70 | HR 78 | Resp 20 | Ht 62.0 in | Wt 185.0 lb

## 2015-08-07 DIAGNOSIS — R202 Paresthesia of skin: Secondary | ICD-10-CM | POA: Diagnosis not present

## 2015-08-07 DIAGNOSIS — G5711 Meralgia paresthetica, right lower limb: Secondary | ICD-10-CM | POA: Diagnosis not present

## 2015-08-07 DIAGNOSIS — E538 Deficiency of other specified B group vitamins: Secondary | ICD-10-CM

## 2015-08-07 DIAGNOSIS — G609 Hereditary and idiopathic neuropathy, unspecified: Secondary | ICD-10-CM

## 2015-08-07 MED ORDER — DULOXETINE HCL 30 MG PO CPEP: ORAL_CAPSULE | ORAL | Status: DC

## 2015-08-07 NOTE — Progress Notes (Signed)
Reason for visit: Paresthesias  [name] is an 75 y.o. female  History of present illness:  [name] is a 75 year old right-handed black female with a history of end-stage renal disease, and paresthesias in the legs. 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 The patient has been told that this is the maximum dose for her there given her renal disease. The patient indicates that the burning is much worse around 4 or 5 in the evening. The patient also reports that her legs will jerk and twitch in the evenings while sleeping. The patient has developed some slight weakness of the left arm, she has neck pain on the left side, paresthesias in this area. The patient reports that she has had difficulty controlling the bowels, she is being followed through gastroenterology. She has had some balance issues, no falls, she does not use a cane for ambulation. Overall, she believes that the pain levels have significantly worsened. MRI of the lumbosacral spine has been done recently, but has not shown significant spinal stenosis. She returns to this office for an evaluation.  Past Medical History  Diagnosis Date  . Hyperlipidemia   . Normal cardiac stress test 12/24/2009    lexiscan, imaging normal  . ANEMIA NEC 03/31/2007    Qualifier: Diagnosis of  By: Brianna Morn MD, [name]    . Diverticulitis   . IBS (irritable bowel syndrome)   . Thyroid disease   . Depression   . Chronic kidney disease     Hemo MWF  . GERD (gastroesophageal reflux disease)   . H/O hiatal hernia   . Arthritis   . RLS (restless legs syndrome)   . Tubular adenoma of colon 01/2008  . Seizures ([name])     2004  . Constipation   . Sinus complaint   . Dialysis patient [name])     kidney  . Renal disorder   . Adrenal mass ([name])   . Hypertension   . High cholesterol   . Back pain   . Meralgia paresthetica of right side 12/26/2014  .  Abnormality of gait 03/28/2015    Past Surgical History  Procedure Laterality Date  . Cardiac catheterization  2003    normal  . Cholecystectomy      Open mid line incision  . Frontal craniotomy  2002    indication = sinusitis  . Appendectomy    . Tubal ligation    . Abdominal hysterectomy    . Insertion of dialysis catheter      Left  . Revison of arteriovenous fistula  05/11/2012    Procedure: REVISON OF ARTERIOVENOUS FISTULA;  Surgeon: Brianna Dutch, MD;  Location: [name];  Service: Vascular;  Laterality: Right;  . Av fistula placement Left 11/11/2012    Procedure: INSERTION OF ARTERIOVENOUS (AV) GORE-TEX GRAFT ARM;  Surgeon: Brianna Mould, MD;  Location: [name];  Service: Vascular;  Laterality: Left;  . Avgg removal Left 11/18/2012    Procedure: REMOVAL OF LEFT UPPER ARM ARTERIOVENOUS GORETEX GRAFT ([name]);  Surgeon: Brianna Mould, MD;  Location: [name];  Service: Vascular;  Laterality: Left;  . Patch angioplasty Left 11/18/2012    Procedure: PATCH ANGIOPLASTY;  Surgeon: Brianna Mould, MD;  Location: [name];  Service: Vascular;  Laterality: Left;  . Frontal cranialcity  2002  . Peripheral vascular catheterization Left 03/08/2015    Procedure: A/V Shuntogram/Fistulagram;  Surgeon: Algernon Huxley, MD;  Location: [name];  Service: Cardiovascular;  Laterality: Left;  . Peripheral vascular catheterization Left 03/08/2015    Procedure: A/V Shunt Intervention;  Surgeon: Algernon Huxley, MD;  Location: [name];  Service: Cardiovascular;  Laterality: Left;  . Anal rectal manometry N/A 07/25/2015    Procedure: ANO RECTAL MANOMETRY;  Surgeon: Mauri Pole, MD;  Location: [name];  Service: [name];  Laterality: N/A;    Family History  Problem Relation Age of Onset  . Thyroid cancer Mother   . Heart disease Father   . Hypertension Father   . Heart attack Father   . Breast cancer Sister   . Lupus Daughter     Social history:  reports  that she quit smoking about 2 months ago. Her smoking use included Cigarettes. She started smoking about 13 months ago. She has a 60 pack-year smoking history. She has never used smokeless tobacco. She reports that she does not drink alcohol or use illicit drugs.    Allergies  Allergen Reactions  . Tuberculin Tests Hives    "blisters"  . Valacyclovir Other (See Comments)    Confusion and nervousness  . Codeine Nausea And Vomiting  . Penicillins Rash    No problems breathing. Has tolerated omnicef in past without issue  . Sulfamethoxazole Rash    Medications:  Prior to Admission medications   Medication Sig Start Date End Date Taking? Authorizing Provider  ABILIFY 2 MG tablet Take 0.5 tablets (1 mg total) by mouth daily. 11/23/14  Yes Brianna Spikes, DO  albuterol (PROVENTIL HFA;VENTOLIN HFA) 108 (90 Base) MCG/ACT inhaler Inhale 2 puffs into the lungs every 4 (four) hours as needed for wheezing or shortness of breath. 06/05/15  Yes Ashly Windell Moulding, DO  allopurinol (ZYLOPRIM) 100 MG tablet TAKE ONE TABLET BY MOUTH ONCE DAILY ON THE FOLLOWING DIALYSIS (TUESDAY, THURSDAY, AND SATURDAY) 07/30/15  Yes Bryan R Hess, DO  amLODipine (NORVASC) 10 MG tablet TAKE ONE TABLET BY MOUTH ONCE DAILY 09/25/14  Yes Brianna Spikes, DO  aspirin EC 81 MG tablet Take 81 mg by mouth every morning.   Yes Historical Provider, MD  atorvastatin (LIPITOR) 40 MG tablet Take 1 tablet (40 mg total) by mouth daily. 02/26/15  Yes [place] N Rumley, DO  cinacalcet (SENSIPAR) 30 MG tablet Take 30 mg by mouth at bedtime.   Yes Historical Provider, MD  cloNIDine (CATAPRES) 0.1 MG tablet Take 0.1-0.2 mg by mouth 2 (two) times daily. Take 1 tablet in the morning and 2 tablets in the evening   Yes Historical Provider, MD  fluticasone (FLONASE) 50 MCG/ACT nasal spray Place 2 sprays into both nostrils daily as needed for allergies or rhinitis.    Yes Historical Provider, MD  folic acid-vitamin b complex-vitamin c-selenium-zinc (DIALYVITE) 3  MG TABS tablet Take 1 tablet by mouth daily.   Yes Historical Provider, MD  ipratropium (ATROVENT HFA) 17 MCG/ACT inhaler Inhale 2 puffs into the lungs every 4 (four) hours as needed for wheezing. 06/05/15  Yes Ashly Windell Moulding, DO  lidocaine-prilocaine (EMLA) cream Apply 1 application topically 3 (three) times a week. Use before dialysis on MWF   Yes Historical Provider, MD  loratadine (CLARITIN) 10 MG tablet Take 1 tablet (10 mg total) by mouth daily as needed for allergies. 09/27/14  Yes Brianna Spikes, DO  losartan (COZAAR) 100 MG tablet Take 1 tablet (100 mg total) by mouth daily. 09/27/14  Yes Brianna Spikes, DO  metoprolol succinate (TOPROL-XL) 100 MG 24 hr tablet Take 100 mg by mouth at bedtime. Take with or immediately following a meal.   Yes Historical Provider, MD  multivitamin (RENA-VIT) TABS tablet Take 1 tablet by mouth at bedtime. 01/01/15  Yes Veatrice Bourbon, MD  Na Sulfate-K Sulfate-Mg Sulf SOLN Suprep-use as directed 07/12/15  Yes Ladene Artist, MD  nicotine (NICODERM CQ - DOSED IN MG/24 HOURS) 14 mg/24hr patch Place 1 patch (14 mg total) onto the skin daily. 06/08/15  Yes [place] N Rumley, DO  omeprazole (PRILOSEC) 20 MG capsule Take 20 mg by mouth 2 (two) times daily.   Yes Historical Provider, MD  pregabalin (LYRICA) 75 MG capsule Take 75 mg by mouth every evening.    Yes Historical Provider, MD  senna (SENOKOT) 8.6 MG tablet Take 2 tablets by mouth daily as needed. For constipation   Yes Historical Provider, MD  sevelamer carbonate (RENVELA) 800 MG tablet Take 2,400 mg by mouth 3 (three) times daily with meals as needed (whenever food is consumed).    Yes Historical Provider, MD    ROS:  Out of a complete 14 system review of symptoms, the patient complains only of the following symptoms, and all other reviewed systems are negative.  Restless legs, insomnia, frequent waking Back pain, muscle cramps Depression  Blood pressure 186/70, pulse 78, resp. rate 20, height 5\' 2"  (1.575 m),  weight 185 lb (83.915 kg).  Physical Exam  General: The patient is alert and cooperative at the time of the examination. The patient is moderately obese.  Skin: No significant peripheral edema is noted.   Neurologic Exam  Mental status: The patient is alert and oriented x 3 at the time of the examination. The patient has apparent normal recent and remote memory, with an apparently normal attention span and concentration ability.   Cranial nerves: Facial symmetry is present. Speech is normal, no aphasia or dysarthria is noted. Extraocular movements are full. Visual fields are full.  Motor: The patient has good strength in all 4 extremities.  Sensory examination: Soft touch sensation is symmetric on the face, arms, and legs. The patient reports tenderness with light touch on the legs, both hands, and up on the left shoulder and neck area, not on the head and neck.  Coordination: The patient has good finger-nose-finger and heel-to-shin bilaterally.  Gait and station: The patient has a slightly wide-based gait. Tandem gait is unsteady. Romberg is negative. No drift is seen.  Reflexes: Deep tendon reflexes are symmetric, ankle jerk reflexes are maintained.    MRI lumbar 05/14/2015:  IMPRESSION: This is an abnormal MRI of the lumbar spine without contrast showing the following: 1. Multilevel degenerative changes as detailed above and essentially unchanged when compared to the MRI dated 11/10/2013..  2. At T10-T11, T11-T12, T12-L1, L1-L2 and L5-S1 there are mild to moderate degenerative changes that do not appear to lead to spinal stenosis or significant foraminal or lateral recess stenosis.  3. At L2-L3 there is mild spinal stenosis but no nerve root compression. 4. At L3-L4, there is a left paramedian disc extrusion and mild spinal stenosis. There does not appear to be nerve root compression at this level. 5. At L4-L5, there is facet hypertrophy, ligamentum flavum  hypertrophy and borderline spinal stenosis. Moderate left foraminal narrowing and bilateral lateral recess stenosis but there does not appear to be any definite nerve root compression. 6. The conus medullaris and cauda equina appear normal. 7. Unchanged diffuse decrease marrow signal that could  be compatible with chronic renal insufficiency  * MRI scan images were reviewed online. I agree with the written report.   Assessment/Plan:  1. Paresthesias all 4 extremities  2. End-stage renal disease  3. Mild gait disturbance  The patient is reporting significant sensitivity with light touch on all 4 extremities, and including the left shoulder and left neck area. For this reason, MRI the cervical spine will be done. The patient reports fecal incontinence. She will be placed on Cymbalta taking 30 mg daily for 2 weeks, then go to 30 mg twice daily. She will remain on Lyrica 75 mg daily. She will follow-up in 4 months. Blood work will be done today.  Jill Alexanders MD 08/07/2015 7:14 PM  [facility] [address] [place], [place] [address]  Phone [phone] Fax [phone]

## 2015-08-07 NOTE — Patient Instructions (Signed)
Paresthesia  Paresthesia is a burning or prickling feeling. This feeling can happen in any part of the body. It often happens in the hands, arms, legs, or feet. Usually, it is not painful. In most cases, the feeling goes away in a short time and is not a sign of a serious problem.  HOME CARE  · Avoid drinking alcohol.  · Try massage or needle therapy (acupuncture) to help with your problems.  · Keep all follow-up visits as told by your doctor. This is important.  GET HELP IF:  · You keep on having episodes of paresthesia.  · Your burning or prickling feeling gets worse when you walk.  · You have pain or cramps.  · You feel dizzy.  · You have a rash.  GET HELP RIGHT AWAY IF:  · You feel weak.  · You have trouble walking or moving.  · You have problems speaking, understanding, or seeing.  · You feel confused.  · You cannot control when you pee (urinate) or poop (bowel movement).  · You lose feeling (numbness) after an injury.  · You pass out (faint).     This information is not intended to replace advice given to you by your health care provider. Make sure you discuss any questions you have with your health care provider.     Document Released: 05/01/2008 Document Revised: 10/03/2014 Document Reviewed: 05/15/2014  Elsevier Interactive Patient Education ©2016 [employer]

## 2015-08-08 ENCOUNTER — Telehealth: Payer: Self-pay | Admitting: Gastroenterology

## 2015-08-08 DIAGNOSIS — D631 Anemia in chronic kidney disease: Secondary | ICD-10-CM | POA: Diagnosis not present

## 2015-08-08 DIAGNOSIS — N186 End stage renal disease: Secondary | ICD-10-CM | POA: Diagnosis not present

## 2015-08-08 DIAGNOSIS — D509 Iron deficiency anemia, unspecified: Secondary | ICD-10-CM | POA: Diagnosis not present

## 2015-08-08 DIAGNOSIS — N2581 Secondary hyperparathyroidism of renal origin: Secondary | ICD-10-CM | POA: Diagnosis not present

## 2015-08-08 DIAGNOSIS — K6289 Other specified diseases of anus and rectum: Secondary | ICD-10-CM

## 2015-08-08 NOTE — Telephone Encounter (Signed)
Patient given report details and recommendations.  She agrees to proceed with both [place] and surgical referral [place] is not able to be scheduled at any imaging facility here in [place].  Discussed with Dr. Fuller Plan cancel [place] and refer to Dr. Marcello Moores

## 2015-08-08 NOTE — Telephone Encounter (Addendum)
Dr. Fuller Plan have you decided what to do with this patient?  I gave you the anorectal mano  report last week. She has a colonoscopy scheduled with you for 08/28/15

## 2015-08-08 NOTE — Telephone Encounter (Signed)
As per Dr. Woodward Ku recommendations: Transrectal [place] to evaluate sphincter integrity and referral to Dr. Marcello Moores.

## 2015-08-08 NOTE — Telephone Encounter (Signed)
Patient is scheduled for Dr. Marcello Moores for 09/04/15 10:00 arrival for 4:30 appt.   Left message for patient to call back

## 2015-08-09 ENCOUNTER — Telehealth: Payer: Self-pay | Admitting: Neurology

## 2015-08-09 LAB — COPPER, SERUM: COPPER: 125 ug/dL (ref 72–166)

## 2015-08-09 LAB — B. BURGDORFI ANTIBODIES

## 2015-08-09 LAB — ANGIOTENSIN CONVERTING ENZYME: ANGIO CONVERT ENZYME: 57 U/L (ref 14–82)

## 2015-08-09 LAB — MULTIPLE MYELOMA PANEL, SERUM
ALBUMIN SERPL ELPH-MCNC: 3.4 g/dL (ref 2.9–4.4)
ALBUMIN/GLOB SERPL: 0.9 (ref 0.7–1.7)
ALPHA 1: 0.3 g/dL (ref 0.0–0.4)
ALPHA2 GLOB SERPL ELPH-MCNC: 1 g/dL (ref 0.4–1.0)
B-Globulin SerPl Elph-Mcnc: 1.1 g/dL (ref 0.7–1.3)
Gamma Glob SerPl Elph-Mcnc: 1.4 g/dL (ref 0.4–1.8)
Globulin, Total: 3.8 g/dL (ref 2.2–3.9)
IGA/IMMUNOGLOBULIN A, SERUM: 417 mg/dL (ref 64–422)
IGG (IMMUNOGLOBIN G), SERUM: 1379 mg/dL (ref 700–1600)
IGM (IMMUNOGLOBULIN M), SRM: 66 mg/dL (ref 26–217)
TOTAL PROTEIN: 7.2 g/dL (ref 6.0–8.5)

## 2015-08-09 LAB — VITAMIN B12: Vitamin B-12: 697 pg/mL (ref 211–946)

## 2015-08-09 LAB — RHEUMATOID FACTOR: RHEUMATOID FACTOR: 19.8 [IU]/mL — AB (ref 0.0–13.9)

## 2015-08-09 LAB — ANA W/REFLEX: Anti Nuclear Antibody(ANA): NEGATIVE

## 2015-08-09 NOTE — Telephone Encounter (Signed)
Patient notified of the recommendations and appt dates and times

## 2015-08-09 NOTE — Telephone Encounter (Signed)
I called the patient. The blood work if OK, except there is a low titer elevation of the RF, not likely to be clinically significant. The MRI of the cervical spine is pending.

## 2015-08-10 DIAGNOSIS — N186 End stage renal disease: Secondary | ICD-10-CM | POA: Diagnosis not present

## 2015-08-10 DIAGNOSIS — D509 Iron deficiency anemia, unspecified: Secondary | ICD-10-CM | POA: Diagnosis not present

## 2015-08-10 DIAGNOSIS — N2581 Secondary hyperparathyroidism of renal origin: Secondary | ICD-10-CM | POA: Diagnosis not present

## 2015-08-10 DIAGNOSIS — D631 Anemia in chronic kidney disease: Secondary | ICD-10-CM | POA: Diagnosis not present

## 2015-08-13 DIAGNOSIS — D509 Iron deficiency anemia, unspecified: Secondary | ICD-10-CM | POA: Diagnosis not present

## 2015-08-13 DIAGNOSIS — N2581 Secondary hyperparathyroidism of renal origin: Secondary | ICD-10-CM | POA: Diagnosis not present

## 2015-08-13 DIAGNOSIS — N186 End stage renal disease: Secondary | ICD-10-CM | POA: Diagnosis not present

## 2015-08-13 DIAGNOSIS — D631 Anemia in chronic kidney disease: Secondary | ICD-10-CM | POA: Diagnosis not present

## 2015-08-15 DIAGNOSIS — N186 End stage renal disease: Secondary | ICD-10-CM | POA: Diagnosis not present

## 2015-08-15 DIAGNOSIS — D631 Anemia in chronic kidney disease: Secondary | ICD-10-CM | POA: Diagnosis not present

## 2015-08-15 DIAGNOSIS — D509 Iron deficiency anemia, unspecified: Secondary | ICD-10-CM | POA: Diagnosis not present

## 2015-08-15 DIAGNOSIS — N2581 Secondary hyperparathyroidism of renal origin: Secondary | ICD-10-CM | POA: Diagnosis not present

## 2015-08-17 DIAGNOSIS — D509 Iron deficiency anemia, unspecified: Secondary | ICD-10-CM | POA: Diagnosis not present

## 2015-08-17 DIAGNOSIS — N186 End stage renal disease: Secondary | ICD-10-CM | POA: Diagnosis not present

## 2015-08-17 DIAGNOSIS — D631 Anemia in chronic kidney disease: Secondary | ICD-10-CM | POA: Diagnosis not present

## 2015-08-17 DIAGNOSIS — N2581 Secondary hyperparathyroidism of renal origin: Secondary | ICD-10-CM | POA: Diagnosis not present

## 2015-08-20 DIAGNOSIS — D509 Iron deficiency anemia, unspecified: Secondary | ICD-10-CM | POA: Diagnosis not present

## 2015-08-20 DIAGNOSIS — D631 Anemia in chronic kidney disease: Secondary | ICD-10-CM | POA: Diagnosis not present

## 2015-08-20 DIAGNOSIS — N2581 Secondary hyperparathyroidism of renal origin: Secondary | ICD-10-CM | POA: Diagnosis not present

## 2015-08-20 DIAGNOSIS — N186 End stage renal disease: Secondary | ICD-10-CM | POA: Diagnosis not present

## 2015-08-22 DIAGNOSIS — N186 End stage renal disease: Secondary | ICD-10-CM | POA: Diagnosis not present

## 2015-08-22 DIAGNOSIS — N2581 Secondary hyperparathyroidism of renal origin: Secondary | ICD-10-CM | POA: Diagnosis not present

## 2015-08-22 DIAGNOSIS — D509 Iron deficiency anemia, unspecified: Secondary | ICD-10-CM | POA: Diagnosis not present

## 2015-08-22 DIAGNOSIS — D631 Anemia in chronic kidney disease: Secondary | ICD-10-CM | POA: Diagnosis not present

## 2015-08-24 DIAGNOSIS — N2581 Secondary hyperparathyroidism of renal origin: Secondary | ICD-10-CM | POA: Diagnosis not present

## 2015-08-24 DIAGNOSIS — D631 Anemia in chronic kidney disease: Secondary | ICD-10-CM | POA: Diagnosis not present

## 2015-08-24 DIAGNOSIS — N186 End stage renal disease: Secondary | ICD-10-CM | POA: Diagnosis not present

## 2015-08-24 DIAGNOSIS — D509 Iron deficiency anemia, unspecified: Secondary | ICD-10-CM | POA: Diagnosis not present

## 2015-08-27 DIAGNOSIS — N2581 Secondary hyperparathyroidism of renal origin: Secondary | ICD-10-CM | POA: Diagnosis not present

## 2015-08-27 DIAGNOSIS — N186 End stage renal disease: Secondary | ICD-10-CM | POA: Diagnosis not present

## 2015-08-27 DIAGNOSIS — D509 Iron deficiency anemia, unspecified: Secondary | ICD-10-CM | POA: Diagnosis not present

## 2015-08-27 DIAGNOSIS — D631 Anemia in chronic kidney disease: Secondary | ICD-10-CM | POA: Diagnosis not present

## 2015-08-28 ENCOUNTER — Encounter: Payer: Self-pay | Admitting: Gastroenterology

## 2015-08-28 ENCOUNTER — Ambulatory Visit (AMBULATORY_SURGERY_CENTER): Payer: Medicare Other | Admitting: Gastroenterology

## 2015-08-28 VITALS — BP 104/61 | HR 75 | Temp 98.6°F | Resp 13 | Ht 61.0 in | Wt 187.0 lb

## 2015-08-28 DIAGNOSIS — D124 Benign neoplasm of descending colon: Secondary | ICD-10-CM

## 2015-08-28 DIAGNOSIS — N186 End stage renal disease: Secondary | ICD-10-CM | POA: Diagnosis not present

## 2015-08-28 DIAGNOSIS — D123 Benign neoplasm of transverse colon: Secondary | ICD-10-CM

## 2015-08-28 DIAGNOSIS — R569 Unspecified convulsions: Secondary | ICD-10-CM | POA: Diagnosis not present

## 2015-08-28 DIAGNOSIS — Z86010 Personal history of colonic polyps: Secondary | ICD-10-CM | POA: Diagnosis not present

## 2015-08-28 DIAGNOSIS — Z992 Dependence on renal dialysis: Secondary | ICD-10-CM | POA: Diagnosis not present

## 2015-08-28 DIAGNOSIS — D122 Benign neoplasm of ascending colon: Secondary | ICD-10-CM | POA: Diagnosis not present

## 2015-08-28 DIAGNOSIS — R159 Full incontinence of feces: Secondary | ICD-10-CM | POA: Diagnosis not present

## 2015-08-28 DIAGNOSIS — F329 Major depressive disorder, single episode, unspecified: Secondary | ICD-10-CM | POA: Diagnosis not present

## 2015-08-28 MED ORDER — SODIUM CHLORIDE 0.9 % IV SOLN: 500.0000 mL | INTRAVENOUS | Status: DC

## 2015-08-28 NOTE — Progress Notes (Signed)
Called to room to assist during endoscopic procedure.  Patient ID and intended procedure confirmed with present staff. Received instructions for my participation in the procedure from the performing physician.  

## 2015-08-28 NOTE — Patient Instructions (Signed)
YOU HAD AN ENDOSCOPIC PROCEDURE TODAY AT [facility]:   Refer to the procedure report that was given to you for any specific questions about what was found during the examination.  If the procedure report does not answer your questions, please call your gastroenterologist to clarify.  If you requested that your care partner not be given the details of your procedure findings, then the procedure report has been included in a sealed envelope for you to review at your convenience later.  YOU SHOULD EXPECT: Some feelings of bloating in the abdomen. Passage of more gas than usual.  Walking can help get rid of the air that was put into your GI tract during the procedure and reduce the bloating. If you had a lower endoscopy (such as a colonoscopy or flexible sigmoidoscopy) you may notice spotting of blood in your stool or on the toilet paper. If you underwent a bowel prep for your procedure, you may not have a normal bowel movement for a few days.  Please Note:  You might notice some irritation and congestion in your nose or some drainage.  This is from the oxygen used during your procedure.  There is no need for concern and it should clear up in a day or so.  SYMPTOMS TO REPORT IMMEDIATELY:   Following lower endoscopy (colonoscopy or flexible sigmoidoscopy):  Excessive amounts of blood in the stool  Significant tenderness or worsening of abdominal pains  Swelling of the abdomen that is new, acute  Fever of 100F or higher   For urgent or emergent issues, a gastroenterologist can be reached at any hour by calling [phone].   DIET: Your first meal following the procedure should be a small meal and then it is ok to progress to your normal diet. Heavy or fried foods are harder to digest and may make you feel nauseous or bloated.  Likewise, meals heavy in dairy and vegetables can increase bloating.  Drink plenty of fluids but you should avoid alcoholic beverages for 24  hours.  ACTIVITY:  You should plan to take it easy for the rest of today and you should NOT DRIVE or use heavy machinery until tomorrow (because of the sedation medicines used during the test).    FOLLOW UP: Our staff will call the number listed on your records the next business day following your procedure to check on you and address any questions or concerns that you may have regarding the information given to you following your procedure. If we do not reach you, we will leave a message.  However, if you are feeling well and you are not experiencing any problems, there is no need to return our call.  We will assume that you have returned to your regular daily activities without incident.  If any biopsies were taken you will be contacted by phone or by letter within the next 1-3 weeks.  Please call us at [phone] if you have not heard about the biopsies in 3 weeks.    SIGNATURES/CONFIDENTIALITY: You and/or your care partner have signed paperwork which will be entered into your electronic medical record.  These signatures attest to the fact that that the information above on your After Visit Summary has been reviewed and is understood.  Full responsibility of the confidentiality of this discharge information lies with you and/or your care-partner.   No NSAID'S (aspirin, aspirin products, or anti-inflammatory drugs) for 2 weeks. Next colonoscopy determined by pathology results. Please review polyp, diverticulosis, and high  fiber diet handouts provided.

## 2015-08-28 NOTE — Progress Notes (Signed)
To Pacu  Awake and  Alert. Report to RN 

## 2015-08-28 NOTE — Op Note (Signed)
[facility] Patient Name: [name] Procedure Date: 08/28/2015 2:00 PM MRN: [id number] Endoscopist: Ladene Artist , MD Age: 75 Referring MD:  Date of Birth: 1940-11-20 Gender: Female Procedure:                Colonoscopy Indications:              Surveillance: Personal history of adenomatous                            polyps on last colonoscopy > 5 years ago Medicines:                Monitored Anesthesia Care Procedure:                Pre-Anesthesia Assessment:                           - Prior to the procedure, a History and Physical                            was performed, and patient medications and                            allergies were reviewed. The patient's tolerance of                            previous anesthesia was also reviewed. The risks                            and benefits of the procedure and the sedation                            options and risks were discussed with the patient.                            All questions were answered, and informed consent                            was obtained. Prior Anticoagulants: The patient has                            taken no previous anticoagulant or antiplatelet                            agents. ASA Grade Assessment: III - A patient with                            severe systemic disease. After reviewing the risks                            and benefits, the patient was deemed in                            satisfactory condition to undergo the procedure.  After obtaining informed consent, the colonoscope                            was passed under direct vision. Throughout the                            procedure, the patient's blood pressure, pulse, and                            oxygen saturations were monitored continuously. The                            Model PCF-H190L [id number]) scope was introduced                            through the anus and advanced to the the  cecum,                            identified by appendiceal orifice and ileocecal                            valve. The colonoscopy was somewhat difficult due                            to multiple diverticula in the left colon and a                            tortuous colon. The patient tolerated the procedure                            well. The quality of the bowel preparation was                            adequate. The ileocecal valve, appendiceal orifice,                            and rectum were photographed. The quality of the                            bowel preparation was adequate. The ileocecal                            valve, appendiceal orifice, and rectum were                            photographed. Scope In: 2:10:55 PM Scope Out: 2:35:30 PM Scope Withdrawal Time: 0 hours 19 minutes 16 seconds  Total Procedure Duration: 0 hours 24 minutes 35 seconds  Findings:      The digital rectal exam findings include decreased sphincter tone.      A 12 mm polyp was found in the ascending colon. The polyp was sessile.       The polyp was removed with a hot snare. Resection and retrieval were       complete.  Three sessile polyps were found in the descending colon (1) and       transverse colon (2). The polyps were 5 to 6 mm in size. These polyps       were removed with a cold snare. Resection and retrieval were complete.      A 4 mm polyp was found in the transverse colon. The polyp was sessile.       The polyp was removed with a cold biopsy forceps. Resection and       retrieval were complete.      Multiple medium-mouthed diverticula were found in the sigmoid colon and       descending colon. Spaam, erythema, lumen narrowing in association with       the diverticular openings.      Many small-mouthed diverticula were found in the transverse colon and       ascending colon.      The exam was otherwise normal throughout the examined colon.      The retroflexed view of the  distal rectum and anal verge was normal and       showed no anal or rectal abnormalities. Complications:            No immediate complications. Estimated Blood Loss:     Estimated blood loss was minimal. Impression:               - Decreased sphincter tone found on digital rectal                            exam.                           - One 12 mm polyp in the ascending colon, removed                            with a hot snare. Resected and retrieved.                           - Three 5 to 6 mm polyps in the descending colon                            and in the transverse colon, removed with a cold                            snare. Resected and retrieved.                           - One 4 mm polyp in the transverse colon, removed                            with a cold biopsy forceps. Resected and retrieved.                           - Severe diverticulosis in the sigmoid colon and in                            the descending colon.                           -  Mild diverticulosis in the ascending colon and                            transverse colon Recommendation:           - Patient has a contact number available for                            emergencies. The signs and symptoms of potential                            delayed complications were discussed with the                            patient. Return to normal activities tomorrow.                            Written discharge instructions were provided to the                            patient.                           - Resume previous diet.                           - Continue present medications.                           - No aspirin, ibuprofen, naproxen, or other                            non-steroidal anti-inflammatory drugs for 2 weeks                            after polyp removal.                           - Await pathology results.                           - Repeat colonoscopy is recommended. The                             colonoscopy date will be determined after pathology                            results from today's exam become available for                            review. Procedure Code(s):        --- Professional ---                           [phone], Colonoscopy, flexible; with removal of  tumor(s), polyp(s), or other lesion(s) by snare                            technique                           [id number], 59, Colonoscopy, flexible; with biopsy,                            single or multiple CPT copyright 2016 American Medical Association. All rights reserved. Ladene Artist, MD 08/28/2015 2:47:30 PM This report has been signed electronically. Number of Addenda: 0 Referring MD:      Lorna Few

## 2015-08-29 ENCOUNTER — Telehealth: Payer: Self-pay

## 2015-08-29 DIAGNOSIS — D509 Iron deficiency anemia, unspecified: Secondary | ICD-10-CM | POA: Diagnosis not present

## 2015-08-29 DIAGNOSIS — N2581 Secondary hyperparathyroidism of renal origin: Secondary | ICD-10-CM | POA: Diagnosis not present

## 2015-08-29 DIAGNOSIS — N186 End stage renal disease: Secondary | ICD-10-CM | POA: Diagnosis not present

## 2015-08-29 DIAGNOSIS — D631 Anemia in chronic kidney disease: Secondary | ICD-10-CM | POA: Diagnosis not present

## 2015-08-29 NOTE — Telephone Encounter (Signed)
  Follow up Call-  Call back number 08/28/2015  Post procedure Call Back phone  # [phone]  Permission to leave phone message Yes     Patient questions:  Do you have a fever, pain , or abdominal swelling? No. Pain Score  0 *  Have you tolerated food without any problems? Yes.    Have you been able to return to your normal activities? Yes.    Do you have any questions about your discharge instructions: Diet   No. Medications  No. Follow up visit  No.  Do you have questions or concerns about your Care? No.  Actions: * If pain score is 4 or above: No action needed, pain <4.

## 2015-08-30 ENCOUNTER — Ambulatory Visit
Admission: RE | Admit: 2015-08-30 | Discharge: 2015-08-30 | Disposition: A | Discharge status: Home / Self Care | Payer: Medicare Other | Source: Ambulatory Visit | Attending: Neurology | Admitting: Neurology

## 2015-08-30 DIAGNOSIS — M50221 Other cervical disc displacement at C4-C5 level: Secondary | ICD-10-CM | POA: Diagnosis not present

## 2015-08-30 DIAGNOSIS — M50222 Other cervical disc displacement at C5-C6 level: Secondary | ICD-10-CM | POA: Diagnosis not present

## 2015-08-30 DIAGNOSIS — R202 Paresthesia of skin: Secondary | ICD-10-CM

## 2015-08-30 DIAGNOSIS — G609 Hereditary and idiopathic neuropathy, unspecified: Secondary | ICD-10-CM

## 2015-08-31 DIAGNOSIS — D631 Anemia in chronic kidney disease: Secondary | ICD-10-CM | POA: Diagnosis not present

## 2015-08-31 DIAGNOSIS — N186 End stage renal disease: Secondary | ICD-10-CM | POA: Diagnosis not present

## 2015-08-31 DIAGNOSIS — N2581 Secondary hyperparathyroidism of renal origin: Secondary | ICD-10-CM | POA: Diagnosis not present

## 2015-08-31 DIAGNOSIS — Z992 Dependence on renal dialysis: Secondary | ICD-10-CM | POA: Diagnosis not present

## 2015-08-31 DIAGNOSIS — E1129 Type 2 diabetes mellitus with other diabetic kidney complication: Secondary | ICD-10-CM | POA: Diagnosis not present

## 2015-08-31 DIAGNOSIS — D509 Iron deficiency anemia, unspecified: Secondary | ICD-10-CM | POA: Diagnosis not present

## 2015-09-02 ENCOUNTER — Telehealth: Payer: Self-pay | Admitting: Neurology

## 2015-09-02 NOTE — Telephone Encounter (Signed)
I called the patient. The MRI does not show significant spinal stenosis or cord compression to explain her symptoms. She is getting benefit on 30 mg of cymbalta daily, she is to go to one twice a day.   MRI cervical 09/01/15:  IMPRESSION: Abnormal MRI scan of the cervical spine showing prominent spondylitic changes throughout most noticeable at C4-5 and C5-6 where there is broad-based disc bulge displacing the cord posteriorly with mild canal and bilateral foraminal narrowing.

## 2015-09-03 DIAGNOSIS — D631 Anemia in chronic kidney disease: Secondary | ICD-10-CM | POA: Diagnosis not present

## 2015-09-03 DIAGNOSIS — N186 End stage renal disease: Secondary | ICD-10-CM | POA: Diagnosis not present

## 2015-09-03 DIAGNOSIS — D509 Iron deficiency anemia, unspecified: Secondary | ICD-10-CM | POA: Diagnosis not present

## 2015-09-03 DIAGNOSIS — N2581 Secondary hyperparathyroidism of renal origin: Secondary | ICD-10-CM | POA: Diagnosis not present

## 2015-09-05 DIAGNOSIS — D631 Anemia in chronic kidney disease: Secondary | ICD-10-CM | POA: Diagnosis not present

## 2015-09-05 DIAGNOSIS — N2581 Secondary hyperparathyroidism of renal origin: Secondary | ICD-10-CM | POA: Diagnosis not present

## 2015-09-05 DIAGNOSIS — D509 Iron deficiency anemia, unspecified: Secondary | ICD-10-CM | POA: Diagnosis not present

## 2015-09-05 DIAGNOSIS — N186 End stage renal disease: Secondary | ICD-10-CM | POA: Diagnosis not present

## 2015-09-07 ENCOUNTER — Encounter: Payer: Self-pay | Admitting: Gastroenterology

## 2015-09-07 DIAGNOSIS — D509 Iron deficiency anemia, unspecified: Secondary | ICD-10-CM | POA: Diagnosis not present

## 2015-09-07 DIAGNOSIS — N186 End stage renal disease: Secondary | ICD-10-CM | POA: Diagnosis not present

## 2015-09-07 DIAGNOSIS — N2581 Secondary hyperparathyroidism of renal origin: Secondary | ICD-10-CM | POA: Diagnosis not present

## 2015-09-07 DIAGNOSIS — D631 Anemia in chronic kidney disease: Secondary | ICD-10-CM | POA: Diagnosis not present

## 2015-09-10 DIAGNOSIS — D631 Anemia in chronic kidney disease: Secondary | ICD-10-CM | POA: Diagnosis not present

## 2015-09-10 DIAGNOSIS — N186 End stage renal disease: Secondary | ICD-10-CM | POA: Diagnosis not present

## 2015-09-10 DIAGNOSIS — N2581 Secondary hyperparathyroidism of renal origin: Secondary | ICD-10-CM | POA: Diagnosis not present

## 2015-09-10 DIAGNOSIS — D509 Iron deficiency anemia, unspecified: Secondary | ICD-10-CM | POA: Diagnosis not present

## 2015-09-11 DIAGNOSIS — R159 Full incontinence of feces: Secondary | ICD-10-CM | POA: Diagnosis not present

## 2015-09-12 DIAGNOSIS — N2581 Secondary hyperparathyroidism of renal origin: Secondary | ICD-10-CM | POA: Diagnosis not present

## 2015-09-12 DIAGNOSIS — N186 End stage renal disease: Secondary | ICD-10-CM | POA: Diagnosis not present

## 2015-09-12 DIAGNOSIS — D509 Iron deficiency anemia, unspecified: Secondary | ICD-10-CM | POA: Diagnosis not present

## 2015-09-12 DIAGNOSIS — D631 Anemia in chronic kidney disease: Secondary | ICD-10-CM | POA: Diagnosis not present

## 2015-09-14 DIAGNOSIS — N186 End stage renal disease: Secondary | ICD-10-CM | POA: Diagnosis not present

## 2015-09-14 DIAGNOSIS — N2581 Secondary hyperparathyroidism of renal origin: Secondary | ICD-10-CM | POA: Diagnosis not present

## 2015-09-14 DIAGNOSIS — D509 Iron deficiency anemia, unspecified: Secondary | ICD-10-CM | POA: Diagnosis not present

## 2015-09-14 DIAGNOSIS — D631 Anemia in chronic kidney disease: Secondary | ICD-10-CM | POA: Diagnosis not present

## 2015-09-17 ENCOUNTER — Emergency Department (HOSPITAL_COMMUNITY)
Admission: EM | Admit: 2015-09-17 | Discharge: 2015-09-17 | Disposition: A | Discharge status: Home / Self Care | Payer: Medicare Other | Attending: Emergency Medicine | Admitting: Emergency Medicine

## 2015-09-17 ENCOUNTER — Emergency Department (HOSPITAL_COMMUNITY): Payer: Medicare Other

## 2015-09-17 ENCOUNTER — Encounter (HOSPITAL_COMMUNITY): Payer: Self-pay

## 2015-09-17 DIAGNOSIS — N186 End stage renal disease: Secondary | ICD-10-CM | POA: Diagnosis not present

## 2015-09-17 DIAGNOSIS — D631 Anemia in chronic kidney disease: Secondary | ICD-10-CM | POA: Diagnosis not present

## 2015-09-17 DIAGNOSIS — N2581 Secondary hyperparathyroidism of renal origin: Secondary | ICD-10-CM | POA: Diagnosis not present

## 2015-09-17 DIAGNOSIS — D509 Iron deficiency anemia, unspecified: Secondary | ICD-10-CM | POA: Diagnosis not present

## 2015-09-17 DIAGNOSIS — R05 Cough: Secondary | ICD-10-CM | POA: Diagnosis not present

## 2015-09-17 DIAGNOSIS — R0602 Shortness of breath: Secondary | ICD-10-CM | POA: Insufficient documentation

## 2015-09-17 HISTORY — DX: Heart failure, unspecified: I50.9

## 2015-09-17 LAB — BASIC METABOLIC PANEL
ANION GAP: 12 (ref 5–15)
BUN: 8 mg/dL (ref 6–20)
CALCIUM: 8.5 mg/dL — AB (ref 8.9–10.3)
CO2: 27 mmol/L (ref 22–32)
Chloride: 100 mmol/L — ABNORMAL LOW (ref 101–111)
Creatinine, Ser: 4.09 mg/dL — ABNORMAL HIGH (ref 0.44–1.00)
GFR, EST AFRICAN AMERICAN: 12 mL/min — AB (ref >60)
GFR, EST NON AFRICAN AMERICAN: 10 mL/min — AB (ref >60)
Glucose, Bld: 123 mg/dL — ABNORMAL HIGH (ref 65–99)
POTASSIUM: 3 mmol/L — AB (ref 3.5–5.1)
Sodium: 139 mmol/L (ref 135–145)

## 2015-09-17 LAB — CBC
HEMATOCRIT: 30.8 % — AB (ref 36.0–46.0)
HEMOGLOBIN: 9.6 g/dL — AB (ref 12.0–15.0)
MCH: 27.8 pg (ref 26.0–34.0)
MCHC: 31.2 g/dL (ref 30.0–36.0)
MCV: 89.3 fL (ref 78.0–100.0)
Platelets: 182 10*3/uL (ref 150–400)
RBC: 3.45 MIL/uL — AB (ref 3.87–5.11)
RDW: 17.6 % — ABNORMAL HIGH (ref 11.5–15.5)
WBC: 8.6 10*3/uL (ref 4.0–10.5)

## 2015-09-17 LAB — I-STAT TROPONIN, ED: TROPONIN I, POC: 0.02 ng/mL (ref 0.00–0.08)

## 2015-09-17 NOTE — ED Notes (Signed)
Pt complaining of SOB x 2 days. Is a dialysis pt, MWF. Was at dialysis and they suggested obtaining a CPAP for night time use. Hx: COPD.

## 2015-09-18 ENCOUNTER — Ambulatory Visit (INDEPENDENT_AMBULATORY_CARE_PROVIDER_SITE_OTHER): Payer: Medicare Other | Admitting: Family Medicine

## 2015-09-18 ENCOUNTER — Encounter: Payer: Self-pay | Admitting: Family Medicine

## 2015-09-18 VITALS — BP 200/70 | HR 66 | Temp 98.2°F | Wt 184.0 lb

## 2015-09-18 DIAGNOSIS — J441 Chronic obstructive pulmonary disease with (acute) exacerbation: Secondary | ICD-10-CM | POA: Diagnosis not present

## 2015-09-18 DIAGNOSIS — I160 Hypertensive urgency: Secondary | ICD-10-CM

## 2015-09-18 MED ORDER — PREDNISONE 50 MG PO TABS: 50.0000 mg | ORAL_TABLET | Freq: Every day | ORAL | Status: DC

## 2015-09-18 MED ORDER — CLONIDINE HCL 0.2 MG PO TABS: 0.4000 mg | ORAL_TABLET | Freq: Three times a day (TID) | ORAL | Status: DC

## 2015-09-18 MED ORDER — DOXYCYCLINE HYCLATE 100 MG PO TABS: 100.0000 mg | ORAL_TABLET | Freq: Two times a day (BID) | ORAL | Status: DC

## 2015-09-18 NOTE — Patient Instructions (Addendum)
Thanks for coming in today.   We will treat you for COPD exacerbation.   Make an appointment to return to see [place] in the next 2 days.   Take your clonidine 2 of the 0.2 mg pills three times daily.   We need to get your BP down.   We will check a BNP today.   Thanks for letting us take care of you.  Sincerely, Paula Compton, MD

## 2015-09-18 NOTE — Progress Notes (Signed)
Patient ID: [name], female   DOB: Jan 02, 1941, 75 y.o.   MRN: [id number]   [facility] Aquilla Hacker, MD Phone: [phone]  Subjective:   # Shortness of Breath - has had significant shortness of breath.  - Going on 4 days now.  - stopped smoking 1 week ago.  - Smoking about 1/2 ppd at that time.  - Has COPD hospitalization in the past 6 months.  - Says this feels exactly like her last COPD exacerbation.  - PFT's with moderately severe lung disease.  - Also on dialysis, MWF  - no swelling in her ankles or legs.  - They took off 1.4 L yesterday.  - Last session Yesterday 4/17.  - Pleuritic chest pain worse with coughing.  - Hard to breathe in deeply.  - Blood pressure has also been very high for about 3-4 weeks. Has been trying to get this down with the Renal physicians in dialysis.  - Can only walk a few steps before resting.  - No diaphoresis. No nausea. No fever. No chills. Nonproductive Cough.  - No chest pain with exertion.   # HTN - she has significantly elevated blood pressure.  - SOB - chest pain with deep breath - Recent eval in the ED yesterday with similar symptoms and trop negative, EKG no changes. CMET / CBC stable.  - She has been trying to get her blood pressure down for the past several weeks. Nephrology has been working on managing this. She is currently on Norvasc, Clonidine, Losartan, Metoprolol BP is still A999333 systolic.  - Her intravascular volume is at or slightly below baseline.  - Echo 2016 with G1DD and no wall motion abnormality.  - No headaches, vision changes, numbness or tingling peripherally. No active chest pain as above. Only related to coughing.   All relevant systems were reviewed and were negative unless otherwise noted in the HPI  Past Medical History Reviewed problem list.  Medications- reviewed and updated Current Outpatient Prescriptions  Medication Sig Dispense Refill  . ABILIFY 2 MG tablet Take 0.5 tablets  (1 mg total) by mouth daily. 90 tablet 3  . albuterol (PROVENTIL HFA;VENTOLIN HFA) 108 (90 Base) MCG/ACT inhaler Inhale 2 puffs into the lungs every 4 (four) hours as needed for wheezing or shortness of breath. 1 Inhaler 1  . allopurinol (ZYLOPRIM) 100 MG tablet TAKE ONE TABLET BY MOUTH ONCE DAILY ON THE FOLLOWING DIALYSIS (TUESDAY, THURSDAY, AND SATURDAY) 30 tablet 0  . amLODipine (NORVASC) 10 MG tablet TAKE ONE TABLET BY MOUTH ONCE DAILY 90 tablet 0  . aspirin EC 81 MG tablet Take 81 mg by mouth every morning.    Marland Kitchen atorvastatin (LIPITOR) 40 MG tablet Take 1 tablet (40 mg total) by mouth daily. 90 tablet 3  . cinacalcet (SENSIPAR) 30 MG tablet Take 30 mg by mouth at bedtime.    . cloNIDine (CATAPRES) 0.1 MG tablet Take 0.1-0.2 mg by mouth 2 (two) times daily. Take 1 tablet in the morning and 2 tablets in the evening    . DULoxetine (CYMBALTA) 30 MG capsule One tablet daily for 2 weeks, then take one twice a day 60 capsule 3  . fluticasone (FLONASE) 50 MCG/ACT nasal spray Place 2 sprays into both nostrils daily as needed for allergies or rhinitis.     . folic acid-vitamin b complex-vitamin c-selenium-zinc (DIALYVITE) 3 MG TABS tablet Take 1 tablet by mouth daily.    Marland Kitchen ipratropium (ATROVENT HFA) 17 MCG/ACT inhaler Inhale 2  puffs into the lungs every 4 (four) hours as needed for wheezing. 1 Inhaler 12  . lidocaine-prilocaine (EMLA) cream Apply 1 application topically 3 (three) times a week. Use before dialysis on MWF    . loratadine (CLARITIN) 10 MG tablet Take 1 tablet (10 mg total) by mouth daily as needed for allergies. 90 tablet 3  . losartan (COZAAR) 100 MG tablet Take 1 tablet (100 mg total) by mouth daily. 90 tablet 3  . metoprolol succinate (TOPROL-XL) 100 MG 24 hr tablet Take 100 mg by mouth at bedtime. Take with or immediately following a meal.    . multivitamin (RENA-VIT) TABS tablet Take 1 tablet by mouth at bedtime. 60 tablet 5  . nicotine (NICODERM CQ - DOSED IN MG/24 HOURS) 14 mg/24hr  patch Place 1 patch (14 mg total) onto the skin daily. 28 patch 3  . omeprazole (PRILOSEC) 20 MG capsule Take 20 mg by mouth 2 (two) times daily.    . pregabalin (LYRICA) 75 MG capsule Take 75 mg by mouth every evening.     . senna (SENOKOT) 8.6 MG tablet Take 2 tablets by mouth daily as needed. For constipation    . sevelamer carbonate (RENVELA) 800 MG tablet Take 2,400 mg by mouth 3 (three) times daily with meals as needed (whenever food is consumed).      No current facility-administered medications for this visit.   Chief complaint-noted No additions to family history Social history- patient is a former smoker stopped 1 week ago.   Objective: BP 226/84 mmHg  Pulse 66  Temp(Src) 98.2 F (36.8 C) (Oral)  Wt 184 lb (83.462 kg)  SpO2 97% Gen: NAD, alert, cooperative with exam HEENT: NCAT, EOMI, PERRL Neck: FROM, supple CV: RRR, 99991111, systolic murmur audible.  Resp: difficult inspiration due to cough, no frank wheezes but restricted lung sounds, slight crackles in the bases, she has regular rate with intermittent cough.  Abd: SNTND, BS present, no guarding or organomegaly Ext: No edema, warm, normal tone, moves UE/LE spontaneously Neuro: Alert and oriented, No gross deficits Skin: no rashes no lesions  Assessment/Plan:  # COPD Exacerbation - Discussed with preceptor Dr. Andria Frames. More likely to be COPD. Not a classic exam for this, but she says is similar to previous episodes. 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 Her murmur and pulse pressure are slightly concerning for this. CXR yesterday unchanged. CMET and CBC mostly unchanged. Slightly low K+ to be repleted in dialysis. EKG without changes, and Troponin negative. Weight is down.  - Treat with prednisone and Doxycycline.  - F/U in 2 days.  - If not improving, consider echo given possibility of AI vs. Acute decompensated HFpEF.  - BNP today.  - Continue albuterol / Tiotropium as needed.   # HTN - significantly  elevated today up to A999333 systolic Diastolic is 84. Concerns listed above. No signs / symptoms consistent with ACS or other end organ dysfunction. Has been ongoing for 2-3 weeks and attmpting to get down. Renal has been titrating her medicine up as well.  - Increase Clonidine to 0.4mg  TID.  - Continue Norvasc, Metoprolol, Losartan.  - S/S of stroke / return precautions reviewed.  - Pt. To return in 2 days for follow up.

## 2015-09-19 DIAGNOSIS — N2581 Secondary hyperparathyroidism of renal origin: Secondary | ICD-10-CM | POA: Diagnosis not present

## 2015-09-19 DIAGNOSIS — D509 Iron deficiency anemia, unspecified: Secondary | ICD-10-CM | POA: Diagnosis not present

## 2015-09-19 DIAGNOSIS — N186 End stage renal disease: Secondary | ICD-10-CM | POA: Diagnosis not present

## 2015-09-19 DIAGNOSIS — D631 Anemia in chronic kidney disease: Secondary | ICD-10-CM | POA: Diagnosis not present

## 2015-09-19 LAB — BRAIN NATRIURETIC PEPTIDE: Brain Natriuretic Peptide: 706.1 pg/mL — ABNORMAL HIGH (ref <100)

## 2015-09-20 ENCOUNTER — Encounter: Payer: Self-pay | Admitting: Family Medicine

## 2015-09-20 ENCOUNTER — Telehealth: Payer: Self-pay | Admitting: Family Medicine

## 2015-09-20 ENCOUNTER — Ambulatory Visit (INDEPENDENT_AMBULATORY_CARE_PROVIDER_SITE_OTHER): Payer: Medicare Other | Admitting: Family Medicine

## 2015-09-20 VITALS — BP 218/72 | HR 66 | Temp 97.7°F | Ht 62.0 in | Wt 185.3 lb

## 2015-09-20 DIAGNOSIS — I160 Hypertensive urgency: Secondary | ICD-10-CM

## 2015-09-20 NOTE — Assessment & Plan Note (Signed)
Blood pressure is still uncontrolled with the changes to her medications.  She is under stress and has called gamblers anonymous to get help I had Casimer Lanius speak with the patient and she plans to follow-up with her. Possible that she may have pseudo-hypertension with her calcified arteries giving a falsely elevated blood pressure reading.  - Discussed with Dr. Andria Frames and came to the consensus to maintain current regimen and with the help of integrative care, then may be her blood pressure will continue to come down with some resolution of her stress. - She will follow-up next Tuesday if still elevated may need to add hydralazine.

## 2015-09-20 NOTE — Patient Instructions (Signed)
Thank you for coming in,   Please follow up with [place] next Tuesday to check your blood pressure again.   If you have any chest pain or any one sided weakness or slurring of your speech then please seek immediate care.   Please bring all of your medications with you to each visit.   Sign up for My Chart to have easy access to your labs results, and communication with your Primary care physician   Please feel free to call with any questions or concerns at any time, at [phone]. --Dr. Raeford Razor

## 2015-09-20 NOTE — Telephone Encounter (Signed)
Called to talk about recent elevated BNP 700 is up from 300 previously. She says her SOB is much better with the prednisone and doxycycline. She is not getting dizzy or SOB with exertion or with standing. Blood pressure remains markedly elevated up to 224 while standing yesterday at dialysis. She is going to be seen today for her continued elevated BP. We discussed doing an echocardiogram given elevated BNP and recent SOB. She has not had LE edema. CXR with some slight vascular congestion. Concern remains given wide pulse pressure. Does not need Echo emergently, but would consider in the near future if her symptoms return / continue, or if she develops LE edema. She has known Diastolic Dysfunction with mild AI.   Paula Compton MD

## 2015-09-20 NOTE — Progress Notes (Signed)
Patient ID: [name], female   DOB: [birth year], 75 y.o.   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  Patient is a 75 year old female that lives with daughter.  Patient is pleasant and engaged in conversation with CSW.  Patient informed CSW she has been dealing with gambling for quite a while, most recently she had to admit this to her family.  Patient has a supportive family.  " My children have known for a while I am now just at the point of admitting I have a problem after almost losing my car and house".  Patient has a history of anxiety and depression.  Last outpatient treatment was about 3 years ago for depression, other previous counseling was over 20 years ago for PTSD. Patient goes to dialysis three days a week and is very active doing volunteer work.  Patient denies depression or anxiety.  Currently taking Abiliy.   States she is also taking Cymbalta for pain however, states she knows this works well for depression.   Patient is experiencing elevated blood pressure due to stress associated with potentially losing her car and home as a result of her compulsive gambling.  Therapy will aid patient with processing her feelings, developing coping skills, behavior change and establishing goals.       Patient is in agreement to return to meet with a Development worker, international aid and states she will schedule an appointment next week.   Casimer Lanius. [place] Work,  [phone] 3:20 PM

## 2015-09-20 NOTE — Progress Notes (Signed)
   Subjective:    [name] - 75 y.o. female MRN [id number]  Date of birth: [birth year]  CC HTN  HPI  [name] is here for HTN.  She is a compulsive gambler  Has had to admit this to her family  She is trying to get out of debt and to keep her from losing her car and losing her house.  Her family is supporting her but it is still a hard thing  This all started about 4 weeks ago.  Her blood pressure had been elevated like this for about 6 months since she has been in financial trouble.   She is taking amlodipine, clonidine, losartan, metoprolol.  The providers at the dialysis unit yesterday agreed to what her changes in her medication was yesterday.  Her blood pressure yesterday at 3:00 was 226/84 Having no shortness of breath or headache.  She had 1.7 L taken off yesterday.  Reports compliance with medications.   PMH: ESRD on HD (M, W, F), CHF, COPD  Health Maintenance:  Health Maintenance Due  Topic Date Due  . ZOSTAVAX  12/01/2001  . DEXA SCAN  12/02/2006  . PNA vac Low Risk Adult (1 of 2 - PCV13) 12/02/2006  . MAMMOGRAM  08/12/2015    Review of Systems See HPI     Objective:   Physical Exam BP 218/72 mmHg  Pulse 66  Temp(Src) 97.7 F (36.5 C) (Oral)  Ht 5\' 2"  (1.575 m)  Wt 185 lb 4.8 oz (84.052 kg)  BMI 33.88 kg/m2 Gen: NAD, alert, cooperative with exam, CV: RRR, good S1/S2, no murmur, no edema,   Resp: CTABL, no wheezes, non-labored Skin: no rashes, normal turgor  Neuro: no gross deficits.       Assessment & Plan:   Hypertensive urgency Blood pressure is still uncontrolled with the changes to her medications.  She is under stress and has called gamblers anonymous to get help I had Casimer Lanius speak with the patient and she plans to follow-up with her. Possible that she may have pseudo-hypertension with her calcified arteries giving a falsely elevated blood pressure reading.  - Discussed with Dr. Andria Frames and came to the consensus to maintain  current regimen and with the help of integrative care, then may be her blood pressure will continue to come down with some resolution of her stress. - She will follow-up next Tuesday if still elevated may need to add hydralazine.

## 2015-09-21 DIAGNOSIS — N186 End stage renal disease: Secondary | ICD-10-CM | POA: Diagnosis not present

## 2015-09-21 DIAGNOSIS — D631 Anemia in chronic kidney disease: Secondary | ICD-10-CM | POA: Diagnosis not present

## 2015-09-21 DIAGNOSIS — N2581 Secondary hyperparathyroidism of renal origin: Secondary | ICD-10-CM | POA: Diagnosis not present

## 2015-09-21 DIAGNOSIS — D509 Iron deficiency anemia, unspecified: Secondary | ICD-10-CM | POA: Diagnosis not present

## 2015-09-24 DIAGNOSIS — D631 Anemia in chronic kidney disease: Secondary | ICD-10-CM | POA: Diagnosis not present

## 2015-09-24 DIAGNOSIS — N2581 Secondary hyperparathyroidism of renal origin: Secondary | ICD-10-CM | POA: Diagnosis not present

## 2015-09-24 DIAGNOSIS — D509 Iron deficiency anemia, unspecified: Secondary | ICD-10-CM | POA: Diagnosis not present

## 2015-09-24 DIAGNOSIS — N186 End stage renal disease: Secondary | ICD-10-CM | POA: Diagnosis not present

## 2015-09-25 ENCOUNTER — Ambulatory Visit (INDEPENDENT_AMBULATORY_CARE_PROVIDER_SITE_OTHER): Payer: Medicare Other | Admitting: Licensed Clinical Social Worker

## 2015-09-25 ENCOUNTER — Ambulatory Visit: Payer: Medicare Other | Admitting: *Deleted

## 2015-09-25 VITALS — BP 170/60 | HR 66

## 2015-09-25 DIAGNOSIS — I160 Hypertensive urgency: Secondary | ICD-10-CM

## 2015-09-25 DIAGNOSIS — Z0130 Encounter for examination of blood pressure without abnormal findings: Secondary | ICD-10-CM

## 2015-09-25 DIAGNOSIS — I10 Essential (primary) hypertension: Secondary | ICD-10-CM

## 2015-09-25 NOTE — Progress Notes (Signed)
   Patient in nurse clinic for blood pressure check.  Patient stated she is taking her medications as prescribed.  Patient stated she was having some diverticulitis issues this morning.  Patient denied chest pain, SOB, headache or dizziness.  Will forward to PCP.  Derl Barrow, RN Today's Vitals   09/25/15 1117  BP: 170/60  Pulse: 66  SpO2: 96%

## 2015-09-25 NOTE — Progress Notes (Signed)
Patient ID: [name], female   DOB: 07/09/1940, 75 y.o.   MRN: [id number]   Reason for follow-up:  Patient referred to [facility] to discuss way of coping with stress.  Patient continues to express being remorseful for putting herself and daughter in a financial strain due to her compulsive gambling.   Patient is in the Action stage of change as she states is attending Gambling anonymous one day a week and has turned all of her money, bills and finances over to her daughter to manage.   Issues discussed: Patient discussed advantages and disadvantages of her behavioral change.  How old behavior was impacting her, her family and health.  Feels free not having to manage her finances.  Discuss how to fill in the gaps of time used for gambling, and getting reconnected to her family.  Motivational Interviewing used during this session.   Identified goals:  Patient will continue to attend Gambling Anonymous with anticipation of getting a sponsor in the next few weeks.  Patient identified the goals below and would like to come to INT clinic over the next 4 to 5 weeks until she has a sponsor.  Goals at [facility] will focus on  . Work on developing new behavior to replace old,  . Working on her Armed forces logistics/support/administrative officer   . Effectively dealing with stress in her life to assist with lowering her blood pressure . Self-Care (would like to get a manicure and go to hair salon)   Patient is in agreement to return to INT clinic next week to continue working on goals to lessen her stress.   [name]. [place] Work,  [phone] 12:56 PM

## 2015-09-26 DIAGNOSIS — N2581 Secondary hyperparathyroidism of renal origin: Secondary | ICD-10-CM | POA: Diagnosis not present

## 2015-09-26 DIAGNOSIS — D631 Anemia in chronic kidney disease: Secondary | ICD-10-CM | POA: Diagnosis not present

## 2015-09-26 DIAGNOSIS — N186 End stage renal disease: Secondary | ICD-10-CM | POA: Diagnosis not present

## 2015-09-26 DIAGNOSIS — D509 Iron deficiency anemia, unspecified: Secondary | ICD-10-CM | POA: Diagnosis not present

## 2015-09-28 DIAGNOSIS — D631 Anemia in chronic kidney disease: Secondary | ICD-10-CM | POA: Diagnosis not present

## 2015-09-28 DIAGNOSIS — D509 Iron deficiency anemia, unspecified: Secondary | ICD-10-CM | POA: Diagnosis not present

## 2015-09-28 DIAGNOSIS — N2581 Secondary hyperparathyroidism of renal origin: Secondary | ICD-10-CM | POA: Diagnosis not present

## 2015-09-28 DIAGNOSIS — N186 End stage renal disease: Secondary | ICD-10-CM | POA: Diagnosis not present

## 2015-09-30 DIAGNOSIS — Z992 Dependence on renal dialysis: Secondary | ICD-10-CM | POA: Diagnosis not present

## 2015-09-30 DIAGNOSIS — N186 End stage renal disease: Secondary | ICD-10-CM | POA: Diagnosis not present

## 2015-09-30 DIAGNOSIS — E1129 Type 2 diabetes mellitus with other diabetic kidney complication: Secondary | ICD-10-CM | POA: Diagnosis not present

## 2015-10-01 DIAGNOSIS — D631 Anemia in chronic kidney disease: Secondary | ICD-10-CM | POA: Diagnosis not present

## 2015-10-01 DIAGNOSIS — N186 End stage renal disease: Secondary | ICD-10-CM | POA: Diagnosis not present

## 2015-10-01 DIAGNOSIS — N2581 Secondary hyperparathyroidism of renal origin: Secondary | ICD-10-CM | POA: Diagnosis not present

## 2015-10-02 ENCOUNTER — Ambulatory Visit (INDEPENDENT_AMBULATORY_CARE_PROVIDER_SITE_OTHER): Payer: Medicare Other | Admitting: Licensed Clinical Social Worker

## 2015-10-02 DIAGNOSIS — Z789 Other specified health status: Secondary | ICD-10-CM

## 2015-10-02 NOTE — Progress Notes (Signed)
Patient ID: [name], female   DOB: 05/26/1941, 75 y.o.   MRN: [id number]   Reason for follow-up: Continue brief therapy to address stress symptoms and behavior change associated with compulsive behavior.   Issues discussed:  Patient has decide she no longer wants to attend the gamblers anonymous. " I want to move forward and hearing the same stories over and over is not allowing me to move forward".  Interventions utilized include a combination of Motivational Interviewing, CBT, and Behavior Activation. Patient's goals were reviewed and adjusted.   The following issues were discussed: 1. Pros/cons of not going to the support group.   2. Ambiance of enjoying daughter handling finances verses wanting to be in control of her own life.  3. Communicating her needs to daughter,  4. Identified one thing she will do this week for self-care, and  5. One activity she will do with her niece.   Patient received Daily Activity Diary for homework.  She will identify the time she spent with her old behavior and think of options to replace a new behavior based on the goals she identified.  Identified goals:  . Improve communication daughter  . Build trust daughter,  . Spend more time niece who she is the legal guardian, and  . Self-care.   Patient is in agreement to return to INT clinic next week to continue working on goals to assist with reducing her stress and behavioral change.   Casimer Lanius. [place] Work,  [phone] 12:36 PM

## 2015-10-03 DIAGNOSIS — N2581 Secondary hyperparathyroidism of renal origin: Secondary | ICD-10-CM | POA: Diagnosis not present

## 2015-10-03 DIAGNOSIS — D631 Anemia in chronic kidney disease: Secondary | ICD-10-CM | POA: Diagnosis not present

## 2015-10-03 DIAGNOSIS — N186 End stage renal disease: Secondary | ICD-10-CM | POA: Diagnosis not present

## 2015-10-04 ENCOUNTER — Ambulatory Visit: Payer: Medicare Other | Admitting: Neurology

## 2015-10-05 ENCOUNTER — Ambulatory Visit (HOSPITAL_COMMUNITY)
Admission: RE | Admit: 2015-10-05 | Discharge: 2015-10-05 | Disposition: A | Discharge status: Home / Self Care | Payer: Medicare Other | Source: Ambulatory Visit | Attending: General Surgery | Admitting: General Surgery

## 2015-10-05 ENCOUNTER — Encounter (HOSPITAL_COMMUNITY)
Admission: RE | Disposition: A | Discharge status: Home / Self Care | Payer: Self-pay | Source: Ambulatory Visit | Attending: General Surgery

## 2015-10-05 ENCOUNTER — Encounter (HOSPITAL_COMMUNITY): Payer: Self-pay | Admitting: *Deleted

## 2015-10-05 DIAGNOSIS — J449 Chronic obstructive pulmonary disease, unspecified: Secondary | ICD-10-CM | POA: Insufficient documentation

## 2015-10-05 DIAGNOSIS — E7800 Pure hypercholesterolemia, unspecified: Secondary | ICD-10-CM | POA: Insufficient documentation

## 2015-10-05 DIAGNOSIS — N186 End stage renal disease: Secondary | ICD-10-CM | POA: Diagnosis not present

## 2015-10-05 DIAGNOSIS — R159 Full incontinence of feces: Secondary | ICD-10-CM | POA: Diagnosis not present

## 2015-10-05 DIAGNOSIS — Z79899 Other long term (current) drug therapy: Secondary | ICD-10-CM | POA: Insufficient documentation

## 2015-10-05 DIAGNOSIS — Z8673 Personal history of transient ischemic attack (TIA), and cerebral infarction without residual deficits: Secondary | ICD-10-CM | POA: Insufficient documentation

## 2015-10-05 DIAGNOSIS — M1990 Unspecified osteoarthritis, unspecified site: Secondary | ICD-10-CM | POA: Diagnosis not present

## 2015-10-05 DIAGNOSIS — N2581 Secondary hyperparathyroidism of renal origin: Secondary | ICD-10-CM | POA: Diagnosis not present

## 2015-10-05 DIAGNOSIS — D631 Anemia in chronic kidney disease: Secondary | ICD-10-CM | POA: Diagnosis not present

## 2015-10-05 HISTORY — PX: RECTAL ULTRASOUND: SHX2306

## 2015-10-05 SURGERY — US RECTUM
Anesthesia: Moderate Sedation

## 2015-10-05 NOTE — Op Note (Signed)
10/05/2015  7:48 AM  PATIENT:  [name]  75 y.o. female  Patient Care Team: Lorna Few, DO as PCP - General (Family Medicine) Estanislado Emms, MD (Nephrology) Coral Spikes, DO (Pediatrics)  PRE-OPERATIVE DIAGNOSIS:  fecal incontinence  POST-OPERATIVE DIAGNOSIS:  Fecal incontinence  PROCEDURE:  Procedure(s): ANAL ULTRASOUND WITH PROBE  SURGEON:  Surgeon(s): Leighton Ruff, MD  ANESTHESIA:   none  EBL: none  PATIENT DISPOSITION:  PACU - hemodynamically stable.   INDICATION: 75 y.o. F with fecal incontinence to loose stools  OR FINDINGS: intact sphincter complex  DESCRIPTION: The patient was identified in the holding area and taken to the procedure room where they were laid in lateral decubitus position on the exam room table.  A timeout was performed indicating the correct patient and procedure.  I began by performing a digital rectal exam. There was some hard stool in the rectal vault that was removed.  There were no abnormal masses.   The US probe was inserted without difficulty.  The levators were identified and the probe was withdrawn slowly.  The internal sphincter was identified and followed distally.  It was intact.  The external sphincter was also identified and followed distally. It was intact.  See photos below. Normal exam.       Distal Int sphincter (intact)    Pelvic floor (levators)    Proximal int sphincter (intact)    External Sphincter (intact)

## 2015-10-05 NOTE — H&P (Signed)
History of Present Illness Leighton Ruff MD; [id number] 12:07 PM) The patient is a 75 year old female who presents with fecal incontinence. 75 year old female who presents to the office as a referral from Dr. Fuller Plan for fecal incontinence. She states that every few months she has several days of incontinence of feces. She is not aware that this is happening. She has no urgency symptoms. Most the time she has regular bowel movements that are soft without diarrhea. She denies any rectal bleeding. Anal manometry shows a decreased resting tone and virtually no squeeze pressure. She did have a normal RAIR. She has a history of several childbirths requiring episiotomies.   Other Problems Elbert Ewings, CMA; 09/11/2015 10:46 AM) Arthritis Back Pain Cerebrovascular Accident Chronic Obstructive Lung Disease Chronic Renal Failure Syndrome Diverticulosis Gastroesophageal Reflux Disease High blood pressure Hypercholesterolemia Oophorectomy  Past Surgical History Elbert Ewings, CMA; 09/11/2015 10:46 AM) Appendectomy Breast Biopsy Left. Cataract Surgery Bilateral. Colon Polyp Removal - Colonoscopy Colon Polyp Removal - Open Foot Surgery Bilateral. Gallbladder Surgery - Open Hysterectomy (not due to cancer) - Complete Tonsillectomy  Diagnostic Studies History Elbert Ewings, CMA; 09/11/2015 10:46 AM) Colonoscopy within last year Mammogram 1-3 years ago Pap Smear >5 years ago  Allergies Elbert Ewings, CMA; 09/11/2015 10:48 AM) Tuberculin PPD *DIAGNOSTIC PRODUCTS* ValACYclovir HCl *ANTIVIRALS* Codeine Sulfate *ANALGESICS - OPIOID* Penicillin V *PENICILLINS* Sulfamethoxazole *Sulfonamides Rash.  Medication History Elbert Ewings, CMA; 09/11/2015 10:50 AM) Abilify (2MG  Tablet, Oral) Active. Ventolin HFA (108 (90 Base)MCG/ACT Aerosol Soln, Inhalation) Active. Allopurinol (100MG  Tablet, Oral) Active. AmLODIPine Besylate (10MG  Tablet, Oral) Active. Atorvastatin  Calcium (40MG  Tablet, Oral) Active. Sensipar (30MG  Tablet, Oral) Active. CloNIDine HCl (0.1MG  Tablet, Oral) Active. DULoxetine HCl (30MG  Capsule DR Part, Oral) Active. Fluconazole (150MG  Tablet, Oral) Active. Atrovent HFA (17MCG/ACT Aerosol Soln, Inhalation) Active. Lidocaine-Prilocaine (2.5-2.5% Cream, External) Active. Loratadine (10MG  Tablet, Oral) Active. Losartan Potassium (100MG  Tablet, Oral) Active. Metoprolol Succinate ER (100MG  Tablet ER 24HR, Oral) Active. Multiple Vitamin (Oral) Active. PriLOSEC (20MG  Capsule DR, Oral) Active. Lyrica (75MG  Capsule, Oral) Active. Senna (8.6MG  Tablet, Oral) Active. Renvela (800MG  Tablet, Oral) Active. Medications Reconciled  Social History Elbert Ewings, [place]; 09/11/2015 10:46 AM) Caffeine use Coffee, Tea. No alcohol use Tobacco use Current some day smoker.  Family History Elbert Ewings, [place]; 09/11/2015 10:46 AM) Arthritis Mother. Breast Cancer Sister. Cancer Mother, Sister. Cerebrovascular Accident Father. Diabetes Mellitus Daughter. Heart Disease Daughter, Father, Mother. Heart disease in female family member before age 92 Hypertension Sister. Kidney Disease Sister. Thyroid problems Mother, Sister.  Pregnancy / Birth History Elbert Ewings, CMA; 09/11/2015 10:46 AM) Age at menarche 71 years. Gravida 3 Maternal age 72-20 Para 3    Review of Systems Elbert Ewings CMA; 09/11/2015 10:46 AM) General Present- Weight Gain. Not Present- Appetite Loss, Chills, Fatigue, Fever, Night Sweats and Weight Loss. Skin Not Present- Change in Wart/Mole, Dryness, Hives, Jaundice, New Lesions, Non-Healing Wounds, Rash and Ulcer. HEENT Present- Seasonal Allergies. Not Present- Earache, Hearing Loss, Hoarseness, Nose Bleed, Oral Ulcers, Ringing in the Ears, Sinus Pain, Sore Throat, Visual Disturbances, Wears glasses/contact lenses and Yellow Eyes. Breast Not Present- Breast Mass, Breast Pain, Nipple Discharge and Skin  Changes. Cardiovascular Present- Leg Cramps. Not Present- Chest Pain, Difficulty Breathing Lying Down, Palpitations, Rapid Heart Rate, Shortness of Breath and Swelling of Extremities. Gastrointestinal Present- Change in Bowel Habits, Excessive gas and Gets full quickly at meals. Not Present- Abdominal Pain, Bloating, Bloody Stool, Chronic diarrhea, Constipation, Difficulty Swallowing, Hemorrhoids, Indigestion, Nausea, Rectal Pain and Vomiting. Female Genitourinary Not Present- Frequency,  Nocturia, Painful Urination, Pelvic Pain and Urgency. Musculoskeletal Present- Back Pain and Joint Stiffness. Not Present- Joint Pain, Muscle Pain, Muscle Weakness and Swelling of Extremities. Neurological Present- Trouble walking. Not Present- Decreased Memory, Fainting, Headaches, Numbness, Seizures, Tingling, Tremor and Weakness. Psychiatric Not Present- Anxiety, Bipolar, Change in Sleep Pattern, Depression, Fearful and Frequent crying. Endocrine Present- Cold Intolerance. Not Present- Excessive Hunger, Hair Changes, Heat Intolerance, Hot flashes and New Diabetes. Hematology Not Present- Easy Bruising, Excessive bleeding, Gland problems, HIV and Persistent Infections.  Vitals Elbert Ewings CMA; 09/11/2015 10:50 AM) 09/11/2015 10:50 AM Weight: 184 lb Height: 62in Body Surface Area: 1.84 m Body Mass Index: 33.65 kg/m  Temp.: 98.67F  Pulse: 67 (Regular)  BP: 140/82 (Sitting, Left Arm, Standard)       Physical Exam Leighton Ruff MD; [id number] 12:08 PM) General Mental Status-Alert. General Appearance-Not in acute distress. Build & Nutrition-Well nourished. Posture-Normal posture. Gait-Normal.  Head and Neck Head-normocephalic, atraumatic with no lesions or palpable masses. Trachea-midline.  Chest and Lung Exam Chest and lung exam reveals -on auscultation, normal breath sounds, no adventitious sounds and normal vocal resonance.  Cardiovascular Cardiovascular examination  reveals -normal heart sounds, regular rate and rhythm with no murmurs.  Abdomen Inspection Inspection of the abdomen reveals - No Hernias. Palpation/Percussion Palpation and Percussion of the abdomen reveal - Soft, Non Tender, No Rigidity (guarding), No hepatosplenomegaly and No Palpable abdominal masses.  Rectal Anorectal Exam External - Note: Moderate amount of gaping noted, anal wink present. Internal - Note: Decreased resting tone, virtually no squeeze pressure noted, good push pressure.  Neurologic Neurologic evaluation reveals -alert and oriented x 3 with no impairment of recent or remote memory, normal attention span and ability to concentrate, normal sensation and normal coordination.  Musculoskeletal Normal Exam - Bilateral-Upper Extremity Strength Normal and Lower Extremity Strength Normal.    Assessment & Plan Leighton Ruff MD; [id number] 12:10 PM) FULL INCONTINENCE OF FECES (R15.9) Impression: 75 year old female with fecal incontinence status post anal rectal manometry showing decreased overall function. Currently she is relatively asymptomatic. She has episodes only once every few months. I will get a rectal ultrasound to evaluate her sphincters more closely. We have also asked her to do a bowel track her diary to get a better idea of her symptoms. She may be a candidate for sacral nerve stimulation or sphincteroplasty depending on these results.

## 2015-10-05 NOTE — Discharge Instructions (Signed)
Post Ultrasound Instructions  1. DIET: Regular.   2. You may have some mild rectal bleeding for the first few days after the procedure.  This should get less and less with time.  Resume any blood thinners 2 days after your procedure unless directed otherwise by your physician. 3. Take your usually prescribed home medications unless otherwise directed. a. If you have any pain, it is helpful to get up and walk around, as it is usually from excess gas. b. If this is not helpful, you can take an over-the-counter pain medication.  Choose one of the following that works best for you: i. Naproxen (Aleve, etc)  Two 220mg  tabs twice a day ii. Ibuprofen (Advil, etc) Three 200mg  tabs four times a day (every meal & bedtime) iii. If you still have pain after using one of these, please call the office 4. It is normal to not have a bowel movement for 2-3 days after colonoscopy.    5. ACTIVITIES as tolerated:   6. You may resume regular (light) daily activities beginning the next day--such as daily self-care, walking, climbing stairs--gradually increasing activities as tolerated.    WHEN TO CALL us [phone]: 1. Fever over 101.5 F (38.5 C)  2. Severe abdominal or chest pain  3. Large amount of rectal bleeding, passing multiple blood clots  4. Dizziness or shortness of breath 5. Increasing nausea or vomiting   The clinic staff is available to answer your questions during regular business hours (8:30am-5pm).  Please dont hesitate to call and ask to speak to one of our nurses for clinical concerns.   If you have a medical emergency, go to the nearest emergency room or call 911.  A surgeon from [facility] is always on call at the [facility], [address], [place], [place], [place]  [address] ? MAIN: [phone] ? TOLL FREE: [phone] ?  FAX [phone] [email or web address]

## 2015-10-07 ENCOUNTER — Encounter (HOSPITAL_COMMUNITY): Payer: Self-pay | Admitting: General Surgery

## 2015-10-08 DIAGNOSIS — N2581 Secondary hyperparathyroidism of renal origin: Secondary | ICD-10-CM | POA: Diagnosis not present

## 2015-10-08 DIAGNOSIS — N186 End stage renal disease: Secondary | ICD-10-CM | POA: Diagnosis not present

## 2015-10-08 DIAGNOSIS — D631 Anemia in chronic kidney disease: Secondary | ICD-10-CM | POA: Diagnosis not present

## 2015-10-09 ENCOUNTER — Ambulatory Visit (INDEPENDENT_AMBULATORY_CARE_PROVIDER_SITE_OTHER): Payer: Medicare Other | Admitting: Licensed Clinical Social Worker

## 2015-10-09 DIAGNOSIS — Z789 Other specified health status: Secondary | ICD-10-CM

## 2015-10-09 NOTE — Progress Notes (Signed)
Patient ID: AHNESTI SABATELLI, female   DOB: [birth year], 75 y.o.   MRN: [id number]   Reason for follow-up: Continue brief therapy to address stress symptoms and behavior change associated with compulsive gambling.     Issues discussed:  Patient continues to be engaged and is making progress towards her goals.  Goals were reviewed no adjustment made today.  However, patient was unable to identify any activates to fill her free time.  Currently she is board and states she has nothing to do. This was the time previously used for gambling. The following issues were discussed: 1. Self-care activity this week was her hair  2. Reviewed daily activity diary received for homework last week 3. Options and community activities for her to participate in  4. Weekly communication with daughter  5. What building trust looks like with her daughter  65. Processed feelings from letter daughter wrote her  7. Identifying quality time with family  For homework patient will identify a new self-care activity, will call senior resources to identify free weekly activities for seniors, and call 211 for community activities.  Patient will continue to think of activities that will fill her day based on the times that are available on her activity diary  Goals:    Continue to Identify positive activities to replace time spent gambling   Improve communication with daughter    Build trust with daughter,    Spend time niece  Self-care.  Patient is in agreement to return to INT clinic next week to continue working on goals to assist with reducing her stress and behavioral change.  Casimer Lanius. [place] [employer],  [phone] 12:20 PM

## 2015-10-10 DIAGNOSIS — N186 End stage renal disease: Secondary | ICD-10-CM | POA: Diagnosis not present

## 2015-10-10 DIAGNOSIS — N2581 Secondary hyperparathyroidism of renal origin: Secondary | ICD-10-CM | POA: Diagnosis not present

## 2015-10-10 DIAGNOSIS — D631 Anemia in chronic kidney disease: Secondary | ICD-10-CM | POA: Diagnosis not present

## 2015-10-12 DIAGNOSIS — N186 End stage renal disease: Secondary | ICD-10-CM | POA: Diagnosis not present

## 2015-10-12 DIAGNOSIS — D631 Anemia in chronic kidney disease: Secondary | ICD-10-CM | POA: Diagnosis not present

## 2015-10-12 DIAGNOSIS — N2581 Secondary hyperparathyroidism of renal origin: Secondary | ICD-10-CM | POA: Diagnosis not present

## 2015-10-15 DIAGNOSIS — N2581 Secondary hyperparathyroidism of renal origin: Secondary | ICD-10-CM | POA: Diagnosis not present

## 2015-10-15 DIAGNOSIS — N186 End stage renal disease: Secondary | ICD-10-CM | POA: Diagnosis not present

## 2015-10-15 DIAGNOSIS — D631 Anemia in chronic kidney disease: Secondary | ICD-10-CM | POA: Diagnosis not present

## 2015-10-16 ENCOUNTER — Ambulatory Visit: Payer: Medicare Other

## 2015-10-17 ENCOUNTER — Other Ambulatory Visit: Payer: Self-pay | Admitting: Neurology

## 2015-10-17 DIAGNOSIS — N186 End stage renal disease: Secondary | ICD-10-CM | POA: Diagnosis not present

## 2015-10-17 DIAGNOSIS — N2581 Secondary hyperparathyroidism of renal origin: Secondary | ICD-10-CM | POA: Diagnosis not present

## 2015-10-17 DIAGNOSIS — D631 Anemia in chronic kidney disease: Secondary | ICD-10-CM | POA: Diagnosis not present

## 2015-10-17 MED ORDER — DULOXETINE HCL 30 MG PO CPEP: ORAL_CAPSULE | ORAL | Status: DC

## 2015-10-17 NOTE — Telephone Encounter (Signed)
I'll increase his Cymbalta dosing to a total of 90 mg daily.

## 2015-10-17 NOTE — Telephone Encounter (Signed)
What do you recommend?

## 2015-10-17 NOTE — Telephone Encounter (Signed)
Patient requesting refill of DULoxetine (CYMBALTA) 30 MG capsule 90 day supply. She is also requesting to be increased to 3 tabs/day. Sts she is still having burning in her legs. Pharmacy:[facility] [address]

## 2015-10-19 DIAGNOSIS — N2581 Secondary hyperparathyroidism of renal origin: Secondary | ICD-10-CM | POA: Diagnosis not present

## 2015-10-19 DIAGNOSIS — N186 End stage renal disease: Secondary | ICD-10-CM | POA: Diagnosis not present

## 2015-10-19 DIAGNOSIS — D631 Anemia in chronic kidney disease: Secondary | ICD-10-CM | POA: Diagnosis not present

## 2015-10-22 DIAGNOSIS — N186 End stage renal disease: Secondary | ICD-10-CM | POA: Diagnosis not present

## 2015-10-22 DIAGNOSIS — D631 Anemia in chronic kidney disease: Secondary | ICD-10-CM | POA: Diagnosis not present

## 2015-10-22 DIAGNOSIS — N2581 Secondary hyperparathyroidism of renal origin: Secondary | ICD-10-CM | POA: Diagnosis not present

## 2015-10-23 ENCOUNTER — Ambulatory Visit (INDEPENDENT_AMBULATORY_CARE_PROVIDER_SITE_OTHER): Payer: Medicare Other | Admitting: Licensed Clinical Social Worker

## 2015-10-23 DIAGNOSIS — Z789 Other specified health status: Secondary | ICD-10-CM

## 2015-10-23 NOTE — Progress Notes (Signed)
Patient ID: [name], female   DOB: 03-Jul-1940, 75 y.o.   MRN: [id number]   Reason for follow-up: Continue brief therapy to address social stressors and behavior change associated with compulsive gambling.     Issues discussed:  Patient presented pleasant, well-groomed, and excited that she had a manicure, which was part of self-care. She continues to be engaged and is making progress towards her goals. Reports no gambling. Previously it was due to not having access to money, now she does not want to disappoint her daughter and herself.  Goals were reviewed and adjustments made. Patient states she does not have the free time she originally thought she had. Has filled time by spending time with her niece and doing house work.  Motivational Interviewing utilized in session.  The following issues were discussed:  Review Self-care activity and discussed new one for new week  Weekly communication with daughter    Building trust with her daughter this week  Establishing and rebuilding relationships (sister and husband)   Pacing self when doing house work  Ongoing support system when therapy ends  For homework patient will continue building trust with her daughter, work on Relationships and Self-care. (organizing her room, working on 1 thing in the AM and 2 in the PM each day). Goals:               Improve communication and build trust with daughter               Spend time niece  Work on relationships (sister and husband)             Self-care.  Patient is in agreement to return to INT clinic next week to continue working on goals to assist with reducing her stress and behavioral change.  Casimer Lanius. [place] [employer],  [phone] 1:01 PM

## 2015-10-24 DIAGNOSIS — N186 End stage renal disease: Secondary | ICD-10-CM | POA: Diagnosis not present

## 2015-10-24 DIAGNOSIS — N2581 Secondary hyperparathyroidism of renal origin: Secondary | ICD-10-CM | POA: Diagnosis not present

## 2015-10-24 DIAGNOSIS — D631 Anemia in chronic kidney disease: Secondary | ICD-10-CM | POA: Diagnosis not present

## 2015-10-24 DIAGNOSIS — I10 Essential (primary) hypertension: Secondary | ICD-10-CM | POA: Diagnosis not present

## 2015-10-26 DIAGNOSIS — N2581 Secondary hyperparathyroidism of renal origin: Secondary | ICD-10-CM | POA: Diagnosis not present

## 2015-10-26 DIAGNOSIS — D631 Anemia in chronic kidney disease: Secondary | ICD-10-CM | POA: Diagnosis not present

## 2015-10-26 DIAGNOSIS — N186 End stage renal disease: Secondary | ICD-10-CM | POA: Diagnosis not present

## 2015-10-29 DIAGNOSIS — D631 Anemia in chronic kidney disease: Secondary | ICD-10-CM | POA: Diagnosis not present

## 2015-10-29 DIAGNOSIS — N2581 Secondary hyperparathyroidism of renal origin: Secondary | ICD-10-CM | POA: Diagnosis not present

## 2015-10-29 DIAGNOSIS — N186 End stage renal disease: Secondary | ICD-10-CM | POA: Diagnosis not present

## 2015-10-30 ENCOUNTER — Ambulatory Visit (INDEPENDENT_AMBULATORY_CARE_PROVIDER_SITE_OTHER): Payer: Medicare Other | Admitting: Licensed Clinical Social Worker

## 2015-10-30 DIAGNOSIS — Z789 Other specified health status: Secondary | ICD-10-CM

## 2015-10-30 NOTE — Progress Notes (Signed)
Patient ID: [name], female   DOB: Oct 03, 1940, 75 y.o.   MRN: [id number]   Reason for follow-up: Continue brief therapy to address social stressors and behavior change associated with compulsive gambling.     Issues discussed:  Patient continues to be engaged and is making progress towards her goals. Reports no gambling however she has been tempted, has not gambled due to not having money. Patient is tearful during session, states tears of joy and sadness as it relates to hurt and progress made with her previous behavior.  Motivational Interviewing utilized in session.  The following was discussed: Reasons patient will not gamble and what she has to loose On-going self-care and feelings associated with this Building trust with family (daughters, sister etc) Daughter managing finances   For homework patient will continue building trust, build relationships and focus on self-care. She will also avoid putting self in situations that lead to gambling temptation and utilize "Walk Away".   Goals were reviewed, no adjustment made:               Improve communication with daughter               Build trust                Spend time niece             Self-care.  Patient is in agreement to return to INT clinic to continue working on goals to assist with reducing her stress and behavioral change.  Casimer Lanius. [place] [employer],  [phone] 12:50 PM

## 2015-10-31 DIAGNOSIS — E1129 Type 2 diabetes mellitus with other diabetic kidney complication: Secondary | ICD-10-CM | POA: Diagnosis not present

## 2015-10-31 DIAGNOSIS — D631 Anemia in chronic kidney disease: Secondary | ICD-10-CM | POA: Diagnosis not present

## 2015-10-31 DIAGNOSIS — Z992 Dependence on renal dialysis: Secondary | ICD-10-CM | POA: Diagnosis not present

## 2015-10-31 DIAGNOSIS — N2581 Secondary hyperparathyroidism of renal origin: Secondary | ICD-10-CM | POA: Diagnosis not present

## 2015-10-31 DIAGNOSIS — N186 End stage renal disease: Secondary | ICD-10-CM | POA: Diagnosis not present

## 2015-11-02 ENCOUNTER — Encounter: Payer: Self-pay | Admitting: Internal Medicine

## 2015-11-02 ENCOUNTER — Ambulatory Visit (INDEPENDENT_AMBULATORY_CARE_PROVIDER_SITE_OTHER): Payer: Medicare Other | Admitting: Internal Medicine

## 2015-11-02 VITALS — BP 142/74 | HR 70 | Temp 98.7°F | Wt 179.6 lb

## 2015-11-02 DIAGNOSIS — K219 Gastro-esophageal reflux disease without esophagitis: Secondary | ICD-10-CM | POA: Diagnosis not present

## 2015-11-02 MED ORDER — ONDANSETRON HCL 4 MG PO TABS: 4.0000 mg | ORAL_TABLET | Freq: Three times a day (TID) | ORAL | Status: DC | PRN

## 2015-11-02 MED ORDER — GI COCKTAIL ~~LOC~~
30.0000 mL | Freq: Once | ORAL | Status: AC
  Administered 2015-11-02: 30 mL via ORAL

## 2015-11-02 MED ORDER — ALUMINUM & MAGNESIUM HYDROXIDE 600-300 MG/5ML PO CONC: 5.0000 mL | Freq: Four times a day (QID) | ORAL | Status: DC | PRN

## 2015-11-02 NOTE — Patient Instructions (Addendum)
I will give you- Maalox to help with GERD you are having. I want you to see GI to determine if you need to come off prilosec. I will also give you Zofran

## 2015-11-02 NOTE — Progress Notes (Signed)
   [facility] Kerrin Mo, MD Phone: [phone]  Reason For Visit: Same for GERD  # Patient missed prilosec for 5 days due to expense. She has been on prilosec for many years due to GERD and Hiatal hernia. Patient took Rolaids and Pepto bismol in the meantime. Patient feels that her esophagus is burning and causing her to  [facility] and gag. State her appetite has decreased today, as she only had  Blueberry muffin for breakfast. Denies any dysphagia. Endorses nausea. States she has vomiting twice today. Her chest feels sore due to the acid reflux. Has been able to drink and keep down fluids. Patient has had an episode like this before but was very long time ago. Patient continuously burping,with burning from the burping.  No recent weight loss. No SOB, diaphorous, chest pressure,    Past Medical History Reviewed problem list.  Medications- reviewed and updated  Objective: BP 142/74 mmHg  Pulse 70  Temp(Src) 98.7 F (37.1 C) (Oral)  Wt 179 lb 9.6 oz (81.466 kg) Gen: NAD, alert, cooperative with exam HEENT: normal oropharynx  CV: RRR, good S1/S2, no murmur, tenders to palpation along midline of the chest all the way to the neck  Abdomen: BS present, slight epigastric tenderness,    Assessment/Plan: See problem based a/p  GASTROESOPHAGEAL REFLUX, NO ESOPHAGITIS Reflux due to rebound acidification from suddenly stopping Prilosec (which patient has been taking for many years) .  - Provided GI cocktail to patient - waited 30 minutes patient's symptoms improved - Recommended restarting Prilosec - Provided 15 tablets of Zofran for nausea - Recommended Maalox to help soothe stomach  - Referral to GI as patient has been on Prilosec for many years due to  GERD symptoms

## 2015-11-03 NOTE — Assessment & Plan Note (Signed)
Reflux due to rebound acidification from suddenly stopping Prilosec (which patient has been taking for many years) .  - Provided GI cocktail to patient - waited 30 minutes patient's symptoms improved - Recommended restarting Prilosec - Provided 15 tablets of Zofran for nausea - Recommended Maalox to help soothe stomach  - Referral to GI as patient has been on Prilosec for many years due to  GERD symptoms

## 2015-11-05 DIAGNOSIS — N2581 Secondary hyperparathyroidism of renal origin: Secondary | ICD-10-CM | POA: Diagnosis not present

## 2015-11-05 DIAGNOSIS — N186 End stage renal disease: Secondary | ICD-10-CM | POA: Diagnosis not present

## 2015-11-05 DIAGNOSIS — Z283 Underimmunization status: Secondary | ICD-10-CM | POA: Diagnosis not present

## 2015-11-05 DIAGNOSIS — D631 Anemia in chronic kidney disease: Secondary | ICD-10-CM | POA: Diagnosis not present

## 2015-11-05 DIAGNOSIS — E875 Hyperkalemia: Secondary | ICD-10-CM | POA: Diagnosis not present

## 2015-11-05 DIAGNOSIS — Z23 Encounter for immunization: Secondary | ICD-10-CM | POA: Diagnosis not present

## 2015-11-06 ENCOUNTER — Ambulatory Visit: Payer: Medicare Other

## 2015-11-07 DIAGNOSIS — N186 End stage renal disease: Secondary | ICD-10-CM | POA: Diagnosis not present

## 2015-11-07 DIAGNOSIS — E875 Hyperkalemia: Secondary | ICD-10-CM | POA: Diagnosis not present

## 2015-11-07 DIAGNOSIS — Z283 Underimmunization status: Secondary | ICD-10-CM | POA: Diagnosis not present

## 2015-11-07 DIAGNOSIS — N2581 Secondary hyperparathyroidism of renal origin: Secondary | ICD-10-CM | POA: Diagnosis not present

## 2015-11-07 DIAGNOSIS — Z23 Encounter for immunization: Secondary | ICD-10-CM | POA: Diagnosis not present

## 2015-11-09 DIAGNOSIS — E875 Hyperkalemia: Secondary | ICD-10-CM | POA: Diagnosis not present

## 2015-11-09 DIAGNOSIS — Z283 Underimmunization status: Secondary | ICD-10-CM | POA: Diagnosis not present

## 2015-11-09 DIAGNOSIS — N186 End stage renal disease: Secondary | ICD-10-CM | POA: Diagnosis not present

## 2015-11-09 DIAGNOSIS — N2581 Secondary hyperparathyroidism of renal origin: Secondary | ICD-10-CM | POA: Diagnosis not present

## 2015-11-09 DIAGNOSIS — Z23 Encounter for immunization: Secondary | ICD-10-CM | POA: Diagnosis not present

## 2015-11-12 ENCOUNTER — Inpatient Hospital Stay (HOSPITAL_COMMUNITY)
Admission: EM | Admit: 2015-11-12 | Discharge: 2015-11-13 | DRG: 640 | Disposition: A | Discharge status: Home / Self Care | Payer: Medicare Other | Attending: Family Medicine | Admitting: Family Medicine

## 2015-11-12 ENCOUNTER — Encounter (HOSPITAL_COMMUNITY): Payer: Self-pay | Admitting: *Deleted

## 2015-11-12 ENCOUNTER — Emergency Department (HOSPITAL_COMMUNITY): Payer: Medicare Other

## 2015-11-12 DIAGNOSIS — I132 Hypertensive heart and chronic kidney disease with heart failure and with stage 5 chronic kidney disease, or end stage renal disease: Secondary | ICD-10-CM | POA: Diagnosis present

## 2015-11-12 DIAGNOSIS — R630 Anorexia: Secondary | ICD-10-CM | POA: Diagnosis present

## 2015-11-12 DIAGNOSIS — I5032 Chronic diastolic (congestive) heart failure: Secondary | ICD-10-CM | POA: Diagnosis present

## 2015-11-12 DIAGNOSIS — D631 Anemia in chronic kidney disease: Secondary | ICD-10-CM | POA: Diagnosis not present

## 2015-11-12 DIAGNOSIS — I160 Hypertensive urgency: Secondary | ICD-10-CM

## 2015-11-12 DIAGNOSIS — Z880 Allergy status to penicillin: Secondary | ICD-10-CM

## 2015-11-12 DIAGNOSIS — D638 Anemia in other chronic diseases classified elsewhere: Secondary | ICD-10-CM | POA: Diagnosis present

## 2015-11-12 DIAGNOSIS — Z885 Allergy status to narcotic agent status: Secondary | ICD-10-CM | POA: Diagnosis not present

## 2015-11-12 DIAGNOSIS — R0600 Dyspnea, unspecified: Secondary | ICD-10-CM | POA: Diagnosis not present

## 2015-11-12 DIAGNOSIS — M109 Gout, unspecified: Secondary | ICD-10-CM | POA: Diagnosis present

## 2015-11-12 DIAGNOSIS — E785 Hyperlipidemia, unspecified: Secondary | ICD-10-CM | POA: Diagnosis present

## 2015-11-12 DIAGNOSIS — N182 Chronic kidney disease, stage 2 (mild): Secondary | ICD-10-CM | POA: Diagnosis not present

## 2015-11-12 DIAGNOSIS — Z8673 Personal history of transient ischemic attack (TIA), and cerebral infarction without residual deficits: Secondary | ICD-10-CM

## 2015-11-12 DIAGNOSIS — Z887 Allergy status to serum and vaccine status: Secondary | ICD-10-CM | POA: Diagnosis not present

## 2015-11-12 DIAGNOSIS — F17210 Nicotine dependence, cigarettes, uncomplicated: Secondary | ICD-10-CM | POA: Diagnosis present

## 2015-11-12 DIAGNOSIS — R0602 Shortness of breath: Secondary | ICD-10-CM | POA: Diagnosis not present

## 2015-11-12 DIAGNOSIS — K5900 Constipation, unspecified: Secondary | ICD-10-CM | POA: Diagnosis present

## 2015-11-12 DIAGNOSIS — Z79899 Other long term (current) drug therapy: Secondary | ICD-10-CM | POA: Diagnosis not present

## 2015-11-12 DIAGNOSIS — F329 Major depressive disorder, single episode, unspecified: Secondary | ICD-10-CM | POA: Diagnosis present

## 2015-11-12 DIAGNOSIS — Z881 Allergy status to other antibiotic agents status: Secondary | ICD-10-CM

## 2015-11-12 DIAGNOSIS — Z7982 Long term (current) use of aspirin: Secondary | ICD-10-CM | POA: Diagnosis not present

## 2015-11-12 DIAGNOSIS — E8889 Other specified metabolic disorders: Secondary | ICD-10-CM | POA: Diagnosis present

## 2015-11-12 DIAGNOSIS — K589 Irritable bowel syndrome without diarrhea: Secondary | ICD-10-CM | POA: Diagnosis present

## 2015-11-12 DIAGNOSIS — N19 Unspecified kidney failure: Secondary | ICD-10-CM | POA: Diagnosis not present

## 2015-11-12 DIAGNOSIS — E875 Hyperkalemia: Principal | ICD-10-CM | POA: Diagnosis present

## 2015-11-12 DIAGNOSIS — G2581 Restless legs syndrome: Secondary | ICD-10-CM | POA: Diagnosis present

## 2015-11-12 DIAGNOSIS — J9621 Acute and chronic respiratory failure with hypoxia: Secondary | ICD-10-CM | POA: Diagnosis present

## 2015-11-12 DIAGNOSIS — N2581 Secondary hyperparathyroidism of renal origin: Secondary | ICD-10-CM | POA: Diagnosis not present

## 2015-11-12 DIAGNOSIS — Z8249 Family history of ischemic heart disease and other diseases of the circulatory system: Secondary | ICD-10-CM

## 2015-11-12 DIAGNOSIS — N186 End stage renal disease: Secondary | ICD-10-CM | POA: Diagnosis not present

## 2015-11-12 DIAGNOSIS — J449 Chronic obstructive pulmonary disease, unspecified: Secondary | ICD-10-CM | POA: Diagnosis not present

## 2015-11-12 DIAGNOSIS — Z992 Dependence on renal dialysis: Secondary | ICD-10-CM

## 2015-11-12 DIAGNOSIS — Z888 Allergy status to other drugs, medicaments and biological substances status: Secondary | ICD-10-CM | POA: Diagnosis not present

## 2015-11-12 DIAGNOSIS — K219 Gastro-esophageal reflux disease without esophagitis: Secondary | ICD-10-CM | POA: Diagnosis present

## 2015-11-12 DIAGNOSIS — I120 Hypertensive chronic kidney disease with stage 5 chronic kidney disease or end stage renal disease: Secondary | ICD-10-CM | POA: Diagnosis not present

## 2015-11-12 LAB — CBC
HCT: 36.9 % (ref 36.0–46.0)
HEMOGLOBIN: 11.2 g/dL — AB (ref 12.0–15.0)
MCH: 27.2 pg (ref 26.0–34.0)
MCHC: 30.4 g/dL (ref 30.0–36.0)
MCV: 89.6 fL (ref 78.0–100.0)
PLATELETS: 178 10*3/uL (ref 150–400)
RBC: 4.12 MIL/uL (ref 3.87–5.11)
RDW: 17.6 % — ABNORMAL HIGH (ref 11.5–15.5)
WBC: 9.6 10*3/uL (ref 4.0–10.5)

## 2015-11-12 LAB — POCT I-STAT, CHEM 8
BUN: 28 mg/dL — AB (ref 6–20)
CREATININE: 7.4 mg/dL — AB (ref 0.44–1.00)
Calcium, Ion: 1.35 mmol/L — ABNORMAL HIGH (ref 1.13–1.30)
Chloride: 103 mmol/L (ref 101–111)
GLUCOSE: 63 mg/dL — AB (ref 65–99)
HEMATOCRIT: 36 % (ref 36.0–46.0)
Hemoglobin: 12.2 g/dL (ref 12.0–15.0)
POTASSIUM: 4.8 mmol/L (ref 3.5–5.1)
Sodium: 140 mmol/L (ref 135–145)
TCO2: 27 mmol/L (ref 0–100)

## 2015-11-12 LAB — BASIC METABOLIC PANEL
ANION GAP: 13 (ref 5–15)
BUN: 43 mg/dL — ABNORMAL HIGH (ref 6–20)
CHLORIDE: 102 mmol/L (ref 101–111)
CO2: 23 mmol/L (ref 22–32)
Calcium: 10.7 mg/dL — ABNORMAL HIGH (ref 8.9–10.3)
Creatinine, Ser: 10.81 mg/dL — ABNORMAL HIGH (ref 0.44–1.00)
GFR calc non Af Amer: 3 mL/min — ABNORMAL LOW (ref >60)
GFR, EST AFRICAN AMERICAN: 4 mL/min — AB (ref >60)
Glucose, Bld: 80 mg/dL (ref 65–99)
SODIUM: 138 mmol/L (ref 135–145)

## 2015-11-12 LAB — GLUCOSE, CAPILLARY
Glucose-Capillary: 65 mg/dL (ref 65–99)
Glucose-Capillary: 154 mg/dL — ABNORMAL HIGH (ref 65–99)

## 2015-11-12 MED ORDER — AMLODIPINE BESYLATE 10 MG PO TABS
10.0000 mg | ORAL_TABLET | Freq: Every day | ORAL | Status: DC
  Administered 2015-11-12: 10 mg via ORAL
  Filled 2015-11-12: qty 1

## 2015-11-12 MED ORDER — PENTAFLUOROPROP-TETRAFLUOROETH EX AERO
1.0000 "application " | INHALATION_SPRAY | CUTANEOUS | Status: DC | PRN
  Filled 2015-11-12: qty 30

## 2015-11-12 MED ORDER — ASPIRIN EC 81 MG PO TBEC
81.0000 mg | DELAYED_RELEASE_TABLET | Freq: Every morning | ORAL | Status: DC
  Administered 2015-11-13: 81 mg via ORAL
  Filled 2015-11-12: qty 1

## 2015-11-12 MED ORDER — INSULIN ASPART 100 UNIT/ML IV SOLN
10.0000 [IU] | Freq: Once | INTRAVENOUS | Status: AC
  Administered 2015-11-12: 10 [IU] via INTRAVENOUS
  Filled 2015-11-12: qty 1

## 2015-11-12 MED ORDER — ALBUTEROL SULFATE (2.5 MG/3ML) 0.083% IN NEBU
5.0000 mg | INHALATION_SOLUTION | Freq: Once | RESPIRATORY_TRACT | Status: AC
  Administered 2015-11-12: 5 mg via RESPIRATORY_TRACT

## 2015-11-12 MED ORDER — PREGABALIN 75 MG PO CAPS
75.0000 mg | ORAL_CAPSULE | Freq: Every evening | ORAL | Status: DC
  Administered 2015-11-12: 75 mg via ORAL
  Filled 2015-11-12: qty 1

## 2015-11-12 MED ORDER — RENA-VITE PO TABS
1.0000 | ORAL_TABLET | Freq: Every day | ORAL | Status: DC
  Administered 2015-11-12: 1 via ORAL
  Filled 2015-11-12: qty 1

## 2015-11-12 MED ORDER — SODIUM CHLORIDE 0.9 % IV SOLN: 100.0000 mL | INTRAVENOUS | Status: DC | PRN

## 2015-11-12 MED ORDER — CLONIDINE HCL 0.2 MG PO TABS: 0.2000 mg | ORAL_TABLET | Freq: Three times a day (TID) | ORAL | Status: DC

## 2015-11-12 MED ORDER — HEPARIN SODIUM (PORCINE) 1000 UNIT/ML DIALYSIS: 1000.0000 [IU] | INTRAMUSCULAR | Status: DC | PRN

## 2015-11-12 MED ORDER — DEXTROSE 50 % IV SOLN
1.0000 | Freq: Once | INTRAVENOUS | Status: AC
  Administered 2015-11-12: 50 mL via INTRAVENOUS
  Filled 2015-11-12: qty 50

## 2015-11-12 MED ORDER — NICOTINE 21 MG/24HR TD PT24
21.0000 mg | MEDICATED_PATCH | Freq: Every day | TRANSDERMAL | Status: DC
  Administered 2015-11-13: 21 mg via TRANSDERMAL
  Filled 2015-11-12 (×2): qty 1

## 2015-11-12 MED ORDER — ALBUTEROL SULFATE (2.5 MG/3ML) 0.083% IN NEBU
INHALATION_SOLUTION | RESPIRATORY_TRACT | Status: AC
  Filled 2015-11-12: qty 6

## 2015-11-12 MED ORDER — DULOXETINE HCL 30 MG PO CPEP
30.0000 mg | ORAL_CAPSULE | Freq: Every morning | ORAL | Status: DC
  Administered 2015-11-13: 30 mg via ORAL
  Filled 2015-11-12: qty 1

## 2015-11-12 MED ORDER — CINACALCET HCL 30 MG PO TABS: 30.0000 mg | ORAL_TABLET | Freq: Every day | ORAL | Status: DC

## 2015-11-12 MED ORDER — LIDOCAINE HCL (PF) 1 % IJ SOLN: 5.0000 mL | INTRAMUSCULAR | Status: DC | PRN

## 2015-11-12 MED ORDER — DOXERCALCIFEROL 4 MCG/2ML IV SOLN: 4.0000 ug | INTRAVENOUS | Status: DC

## 2015-11-12 MED ORDER — CLONIDINE HCL 0.2 MG PO TABS
0.2000 mg | ORAL_TABLET | Freq: Three times a day (TID) | ORAL | Status: DC
  Administered 2015-11-12 – 2015-11-13 (×2): 0.2 mg via ORAL
  Filled 2015-11-12 (×2): qty 1

## 2015-11-12 MED ORDER — METOPROLOL SUCCINATE ER 100 MG PO TB24: 100.0000 mg | ORAL_TABLET | Freq: Every day | ORAL | Status: DC

## 2015-11-12 MED ORDER — ATORVASTATIN CALCIUM 40 MG PO TABS
40.0000 mg | ORAL_TABLET | Freq: Every day | ORAL | Status: DC
  Administered 2015-11-13: 40 mg via ORAL
  Filled 2015-11-12: qty 1

## 2015-11-12 MED ORDER — IPRATROPIUM BROMIDE HFA 17 MCG/ACT IN AERS: 2.0000 | INHALATION_SPRAY | RESPIRATORY_TRACT | Status: DC | PRN

## 2015-11-12 MED ORDER — ALTEPLASE 2 MG IJ SOLR: 2.0000 mg | Freq: Once | INTRAMUSCULAR | Status: DC | PRN

## 2015-11-12 MED ORDER — ACETAMINOPHEN 650 MG RE SUPP: 650.0000 mg | Freq: Four times a day (QID) | RECTAL | Status: DC | PRN

## 2015-11-12 MED ORDER — HEPARIN SODIUM (PORCINE) 5000 UNIT/ML IJ SOLN
5000.0000 [IU] | Freq: Three times a day (TID) | INTRAMUSCULAR | Status: DC
  Administered 2015-11-12 – 2015-11-13 (×2): 5000 [IU] via SUBCUTANEOUS
  Filled 2015-11-12 (×2): qty 1

## 2015-11-12 MED ORDER — HEPARIN SODIUM (PORCINE) 1000 UNIT/ML DIALYSIS: 4000.0000 [IU] | Freq: Once | INTRAMUSCULAR | Status: DC

## 2015-11-12 MED ORDER — METOPROLOL SUCCINATE ER 100 MG PO TB24
150.0000 mg | ORAL_TABLET | Freq: Every day | ORAL | Status: DC
  Administered 2015-11-12: 150 mg via ORAL
  Filled 2015-11-12: qty 1

## 2015-11-12 MED ORDER — ACETAMINOPHEN 325 MG PO TABS
650.0000 mg | ORAL_TABLET | Freq: Four times a day (QID) | ORAL | Status: DC | PRN
Start: 2015-11-12 — End: 2015-11-13

## 2015-11-12 MED ORDER — AMLODIPINE BESYLATE 10 MG PO TABS
10.0000 mg | ORAL_TABLET | Freq: Every day | ORAL | Status: DC
  Administered 2015-11-13: 10 mg via ORAL
  Filled 2015-11-12: qty 1

## 2015-11-12 MED ORDER — ALBUTEROL SULFATE (2.5 MG/3ML) 0.083% IN NEBU: 3.0000 mL | INHALATION_SOLUTION | RESPIRATORY_TRACT | Status: DC | PRN

## 2015-11-12 MED ORDER — ARIPIPRAZOLE 2 MG PO TABS
1.0000 mg | ORAL_TABLET | Freq: Every day | ORAL | Status: DC
  Administered 2015-11-13: 1 mg via ORAL
  Filled 2015-11-12: qty 1

## 2015-11-12 MED ORDER — PANTOPRAZOLE SODIUM 40 MG PO TBEC
40.0000 mg | DELAYED_RELEASE_TABLET | Freq: Every day | ORAL | Status: DC
  Administered 2015-11-13: 40 mg via ORAL
  Filled 2015-11-12: qty 1

## 2015-11-12 MED ORDER — SEVELAMER CARBONATE 800 MG PO TABS: 2400.0000 mg | ORAL_TABLET | Freq: Three times a day (TID) | ORAL | Status: DC | PRN

## 2015-11-12 MED ORDER — ONDANSETRON HCL 4 MG PO TABS: 4.0000 mg | ORAL_TABLET | Freq: Three times a day (TID) | ORAL | Status: DC | PRN

## 2015-11-12 MED ORDER — LOSARTAN POTASSIUM 50 MG PO TABS
100.0000 mg | ORAL_TABLET | Freq: Every day | ORAL | Status: DC
Start: 2015-11-13 — End: 2015-11-13
  Administered 2015-11-13: 100 mg via ORAL
  Filled 2015-11-12: qty 2

## 2015-11-12 MED ORDER — LIDOCAINE-PRILOCAINE 2.5-2.5 % EX CREA
1.0000 "application " | TOPICAL_CREAM | CUTANEOUS | Status: DC | PRN
  Filled 2015-11-12: qty 5

## 2015-11-12 MED ORDER — CALCIUM GLUCONATE 10 % IV SOLN
1.0000 g | Freq: Once | INTRAVENOUS | Status: AC
  Administered 2015-11-12: 1 g via INTRAVENOUS
  Filled 2015-11-12: qty 10

## 2015-11-12 NOTE — ED Notes (Addendum)
Pt here for dialysis and sob.  Dialyzed MWF - states only received 1/2 of dialysis on Fri d/t jerking so much d/t spasms.  Today she did not even go to dialysis b/c she was sob and she was jerking so much. bs diminished.

## 2015-11-12 NOTE — Progress Notes (Signed)
Brief attending H&PE note.  I have discussed with residents and will cosign the H&PE when it is ready.  We have agreed on their documentation and management.  Briefly, 75 yo female with weakness and DOE presents to ER.  She has ESRD and her Friday dialysis was shortened due to leg cramps.  Normal weekend.  Today, weak and SOB with minimal exertion.  Came to ER rather than dialysis thinking she would need more that typical treatment.  Found to have K+>7. Imp Hyperkalemia in patient with ERSD, needs dialysis. Other issues of dyspnea and weakness are likely also related to her renal failure.  For now, dialysis is the initial treatment.  If symptoms persist post dialysis, we may need to broaden our differential.

## 2015-11-12 NOTE — Consult Note (Signed)
[facility] Renal Consultation Note    Indication for Consultation:  Management of ESRD/hemodialysis; anemia, hypertension/volume and secondary hyperparathyroidism PCP: Junie Panning, DO  HPI: [name] is a 75 y.o. 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 PMH significant for hypertension, COPD, HF, GERD, IBS, tubluar adenoma of colon presented to ED today with C/O of feeling weak and pain in feet. Patient shortened hemodialysis treatment last Friday d/t restless leg symptoms, pain and cramping in feet. She did attend her normal volunteer activities on Saturday morning but continued to feel weak with SOB, inability to sleep D/T restless legs and pain in feet. She has also noticed intentional tremors in hands which she says she has not told her neurologist about for fear that she may have Parkinson's disease. Patient expresses distress at perceived loss of control, "I'm falling apart-just old age and I don't want to be a burden.Rene Paci always been so independent but I have to ask for help".  Upon arrival to ED, K+ was found to be >7.5. Scr 10.8 BUN 43 Ca 10.7  EKG shows SR with 1st AVG with tall t waves in V4-V5, I, II. She has been given IV calcium gluconate, insulin and D50w per ED physician and is now being taken to hemodialysis for urgent dialysis for hyperkalemia. CXR shows mild right basilar subsegmental atelectasis. HGB 11.2 HCT 36.9 WBC 9.6 PLT 178.   Past Medical History  Diagnosis Date  . Hyperlipidemia   . Normal cardiac stress test 12/24/2009    lexiscan, imaging normal  . 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 Diverticulitis   . IBS (irritable bowel syndrome)   . Thyroid disease   . Depression   . Chronic kidney disease     Hemo MWF  . GERD (gastroesophageal reflux disease)   . H/O hiatal hernia   . Arthritis   . RLS (restless legs syndrome)   . Tubular adenoma of colon 01/2008  . Seizures ([place])     2004  .  Constipation   . Sinus complaint   . Dialysis patient [place])     kidney  . Renal disorder   . Adrenal mass ([place])   . Hypertension   . High cholesterol   . Back pain   . Meralgia paresthetica of right side 12/26/2014  . Abnormality of gait 03/28/2015  . COPD (chronic obstructive pulmonary disease) ([place])   . TIA (transient ischemic attack)   . CHF (congestive heart failure) [facility])    Past Surgical History  Procedure Laterality Date  . Cardiac catheterization  2003    normal  . Cholecystectomy      Open mid line incision  . Frontal craniotomy  2002    indication = sinusitis  . Appendectomy    . Tubal ligation    . Abdominal hysterectomy    . Insertion of dialysis catheter      Left  . Revison of arteriovenous fistula  05/11/2012    Procedure: REVISON OF ARTERIOVENOUS FISTULA;  Surgeon: Elam Dutch, MD;  Location: [place];  Service: Vascular;  Laterality: Right;  . Av fistula placement Left 11/11/2012    Procedure: INSERTION OF ARTERIOVENOUS (AV) GORE-TEX GRAFT ARM;  Surgeon: Angelia Mould, MD;  Location: [place];  Service: Vascular;  Laterality: Left;  . Avgg removal Left 11/18/2012    Procedure: REMOVAL OF LEFT UPPER ARM ARTERIOVENOUS GORETEX GRAFT ([place]);  Surgeon: Angelia Mould, MD;  Location: [facility];  Service: Vascular;  Laterality: Left;  . Patch angioplasty Left 11/18/2012    Procedure: PATCH ANGIOPLASTY;  Surgeon: Angelia Mould, MD;  Location: [place];  Service: Vascular;  Laterality: Left;  . Frontal cranialcity  2002  . Peripheral vascular catheterization Left 03/08/2015    Procedure: A/V Shuntogram/Fistulagram;  Surgeon: Algernon Huxley, MD;  Location: [place] CV LAB;  Service: Cardiovascular;  Laterality: Left;  . Peripheral vascular catheterization Left 03/08/2015    Procedure: A/V Shunt Intervention;  Surgeon: Algernon Huxley, MD;  Location: [place] CV LAB;  Service: Cardiovascular;  Laterality: Left;  . Anal rectal manometry N/A 07/25/2015     Procedure: ANO RECTAL MANOMETRY;  Surgeon: Mauri Pole, MD;  Location: [facility];  Service: Endoscopy;  Laterality: N/A;  . Rectal ultrasound N/A 10/05/2015    Procedure: ANAL ULTRASOUND WITH PROBE;  Surgeon: Leighton Ruff, MD;  Location: [facility];  Service: Endoscopy;  Laterality: N/A;   Family History  Problem Relation Age of Onset  . Thyroid cancer Mother   . Heart disease Father   . Hypertension Father   . Heart attack Father   . Lupus Daughter   . Breast cancer Sister   . Thyroid cancer Sister    Social History:  reports that she has been smoking Cigarettes.  She started smoking about 16 months ago. She has a 60 pack-year smoking history. She has never used smokeless tobacco. She reports that she does not drink alcohol or use illicit drugs. Allergies  Allergen Reactions  . Tuberculin Tests Hives    "blisters"  . Valacyclovir Other (See Comments)    Confusion and nervousness  . Codeine Nausea And Vomiting  . Penicillins Rash    No problems breathing. Has tolerated omnicef in past without issue Has patient had a PCN reaction causing immediate rash, facial/tongue/throat swelling, SOB or lightheadedness with hypotension: Yes Has patient had a PCN reaction causing severe rash involving mucus membranes or skin necrosis: No Has patient had a PCN reaction that required hospitalization No Has patient had a PCN reaction occurring within the last 10 years: No If all of the above answers are "NO", then may proceed with Cephalosporin  . Sulfamethoxazole Rash   Prior to Admission medications   Medication Sig Start Date End Date Taking? Authorizing Provider  ABILIFY 2 MG tablet Take 0.5 tablets (1 mg total) by mouth daily. 11/23/14  Yes Coral Spikes, DO  acetaminophen (TYLENOL) 500 MG tablet Take 1,000 mg by mouth every 6 (six) hours as needed for mild pain or moderate pain.   Yes Historical Provider, MD  albuterol (PROVENTIL HFA;VENTOLIN HFA) 108 (90 Base) MCG/ACT inhaler Inhale  2 puffs into the lungs every 4 (four) hours as needed for wheezing or shortness of breath. 06/05/15  Yes Ashly Windell Moulding, DO  allopurinol (ZYLOPRIM) 100 MG tablet TAKE ONE TABLET BY MOUTH ONCE DAILY ON THE FOLLOWING DIALYSIS (TUESDAY, THURSDAY, AND SATURDAY) Patient taking differently: Take 100 mg by mouth once daily on non-dialysis days (Tues/Thurs/Sat/Sun) 07/30/15  Yes Nolon Rod, DO  Aluminum & Magnesium Hydroxide 600-300 MG/5ML CONC Take 5 mLs by mouth every 6 (six) hours as needed for indigestion. 11/02/15  Yes Asiyah Cletis Media, MD  amLODipine (NORVASC) 10 MG tablet TAKE ONE TABLET BY MOUTH ONCE DAILY 09/25/14  Yes Coral Spikes, DO  aspirin EC 81 MG tablet Take 81 mg by mouth every morning.   Yes Historical Provider, MD  atorvastatin (LIPITOR) 40 MG tablet  Take 1 tablet (40 mg total) by mouth daily. 02/26/15  Yes [place] N Rumley, DO  cinacalcet (SENSIPAR) 30 MG tablet Take 30 mg by mouth at bedtime.   Yes Historical Provider, MD  cloNIDine (CATAPRES) 0.2 MG tablet Take 2 tablets (0.4 mg total) by mouth 3 (three) times daily. Take 1 tablet in the morning and 2 tablets in the evening Patient taking differently: Take 0.2 mg by mouth 3 (three) times daily.  09/18/15  Yes Aquilla Hacker, MD  DULoxetine (CYMBALTA) 30 MG capsule 1 tablet in the morning, 2 in the evening Patient taking differently: Take 30 mg by mouth every morning.  10/17/15  Yes Kathrynn Ducking, MD  fluticasone [facility]) 50 MCG/ACT nasal spray Place 2 sprays into both nostrils daily as needed for allergies or rhinitis.    Yes Historical Provider, MD  ipratropium (ATROVENT HFA) 17 MCG/ACT inhaler Inhale 2 puffs into the lungs every 4 (four) hours as needed for wheezing. 06/05/15  Yes Ashly Windell Moulding, DO  lidocaine-prilocaine (EMLA) cream Apply 1 application topically 3 (three) times a week. Use before dialysis on MWF   Yes Historical Provider, MD  loratadine (CLARITIN) 10 MG tablet Take 1 tablet (10 mg total) by mouth daily as  needed for allergies. 09/27/14  Yes Coral Spikes, DO  losartan (COZAAR) 100 MG tablet Take 1 tablet (100 mg total) by mouth daily. Patient taking differently: Take 100 mg by mouth 2 (two) times daily.  09/27/14  Yes Coral Spikes, DO  metoprolol succinate (TOPROL-XL) 100 MG 24 hr tablet Take 100 mg by mouth at bedtime. Take with or immediately following a meal.   Yes Historical Provider, MD  metoprolol succinate (TOPROL-XL) 50 MG 24 hr tablet Take 50 mg by mouth at bedtime. 10/15/15  Yes Historical Provider, MD  multivitamin (RENA-VIT) TABS tablet Take 1 tablet by mouth at bedtime. 01/01/15  Yes Alyssa A Lincoln Brigham, MD  omeprazole (PRILOSEC) 20 MG capsule Take 20 mg by mouth 2 (two) times daily.   Yes Historical Provider, MD  pregabalin (LYRICA) 75 MG capsule Take 75 mg by mouth every evening.    Yes Historical Provider, MD  senna (SENOKOT) 8.6 MG tablet Take 2 tablets by mouth daily as needed. For constipation   Yes Historical Provider, MD  sevelamer carbonate (RENVELA) 800 MG tablet Take 2,400 mg by mouth 3 (three) times daily with meals as needed (whenever food is consumed).    Yes Historical Provider, MD  doxycycline (VIBRA-TABS) 100 MG tablet Take 1 tablet (100 mg total) by mouth 2 (two) times daily. Patient not taking: Reported on 11/12/2015 09/18/15   Aquilla Hacker, MD  nicotine (NICODERM CQ - DOSED IN MG/24 HOURS) 14 mg/24hr patch Place 1 patch (14 mg total) onto the skin daily. Patient not taking: Reported on 11/12/2015 06/08/15   [place] N Rumley, DO  ondansetron (ZOFRAN) 4 MG tablet Take 1 tablet (4 mg total) by mouth every 8 (eight) hours as needed for nausea or vomiting. 11/02/15   Asiyah Cletis Media, MD  predniSONE (DELTASONE) 50 MG tablet Take 1 tablet (50 mg total) by mouth daily with breakfast. Patient not taking: Reported on 11/12/2015 09/18/15   Aquilla Hacker, MD   Current Facility-Administered Medications  Medication Dose Route Frequency Provider Last Rate Last Dose  . albuterol  (PROVENTIL) (2.5 MG/3ML) 0.083% nebulizer solution           . [START ON 11/14/2015] doxercalciferol (HECTOROL) injection 4 mcg  4 mcg Intravenous Q M,W,F-HD  Valentina Gu, NP       Current Outpatient Prescriptions  Medication Sig Dispense Refill  . ABILIFY 2 MG tablet Take 0.5 tablets (1 mg total) by mouth daily. 90 tablet 3  . acetaminophen (TYLENOL) 500 MG tablet Take 1,000 mg by mouth every 6 (six) hours as needed for mild pain or moderate pain.    Marland Kitchen albuterol (PROVENTIL HFA;VENTOLIN HFA) 108 (90 Base) MCG/ACT inhaler Inhale 2 puffs into the lungs every 4 (four) hours as needed for wheezing or shortness of breath. 1 Inhaler 1  . allopurinol (ZYLOPRIM) 100 MG tablet TAKE ONE TABLET BY MOUTH ONCE DAILY ON THE FOLLOWING DIALYSIS (TUESDAY, THURSDAY, AND SATURDAY) (Patient taking differently: Take 100 mg by mouth once daily on non-dialysis days (Tues/Thurs/Sat/Sun)) 30 tablet 0  . Aluminum & Magnesium Hydroxide 600-300 MG/5ML CONC Take 5 mLs by mouth every 6 (six) hours as needed for indigestion. 150 mL 0  . amLODipine (NORVASC) 10 MG tablet TAKE ONE TABLET BY MOUTH ONCE DAILY 90 tablet 0  . aspirin EC 81 MG tablet Take 81 mg by mouth every morning.    Marland Kitchen atorvastatin (LIPITOR) 40 MG tablet Take 1 tablet (40 mg total) by mouth daily. 90 tablet 3  . cinacalcet (SENSIPAR) 30 MG tablet Take 30 mg by mouth at bedtime.    . cloNIDine (CATAPRES) 0.2 MG tablet Take 2 tablets (0.4 mg total) by mouth 3 (three) times daily. Take 1 tablet in the morning and 2 tablets in the evening (Patient taking differently: Take 0.2 mg by mouth 3 (three) times daily. ) 180 tablet 2  . DULoxetine (CYMBALTA) 30 MG capsule 1 tablet in the morning, 2 in the evening (Patient taking differently: Take 30 mg by mouth every morning. ) 90 capsule 3  . fluticasone (FLONASE) 50 MCG/ACT nasal spray Place 2 sprays into both nostrils daily as needed for allergies or rhinitis.     Marland Kitchen ipratropium (ATROVENT HFA) 17 MCG/ACT inhaler Inhale 2  puffs into the lungs every 4 (four) hours as needed for wheezing. 1 Inhaler 12  . lidocaine-prilocaine (EMLA) cream Apply 1 application topically 3 (three) times a week. Use before dialysis on MWF    . loratadine (CLARITIN) 10 MG tablet Take 1 tablet (10 mg total) by mouth daily as needed for allergies. 90 tablet 3  . losartan (COZAAR) 100 MG tablet Take 1 tablet (100 mg total) by mouth daily. (Patient taking differently: Take 100 mg by mouth 2 (two) times daily. ) 90 tablet 3  . metoprolol succinate (TOPROL-XL) 100 MG 24 hr tablet Take 100 mg by mouth at bedtime. Take with or immediately following a meal.    . metoprolol succinate (TOPROL-XL) 50 MG 24 hr tablet Take 50 mg by mouth at bedtime.    . multivitamin (RENA-VIT) TABS tablet Take 1 tablet by mouth at bedtime. 60 tablet 5  . omeprazole (PRILOSEC) 20 MG capsule Take 20 mg by mouth 2 (two) times daily.    . pregabalin (LYRICA) 75 MG capsule Take 75 mg by mouth every evening.     . senna (SENOKOT) 8.6 MG tablet Take 2 tablets by mouth daily as needed. For constipation    . sevelamer carbonate (RENVELA) 800 MG tablet Take 2,400 mg by mouth 3 (three) times daily with meals as needed (whenever food is consumed).     Marland Kitchen doxycycline (VIBRA-TABS) 100 MG tablet Take 1 tablet (100 mg total) by mouth 2 (two) times daily. (Patient not taking: Reported on 11/12/2015) 20 tablet 0  .  nicotine (NICODERM CQ - DOSED IN MG/24 HOURS) 14 mg/24hr patch Place 1 patch (14 mg total) onto the skin daily. (Patient not taking: Reported on 11/12/2015) 28 patch 3  . ondansetron (ZOFRAN) 4 MG tablet Take 1 tablet (4 mg total) by mouth every 8 (eight) hours as needed for nausea or vomiting. 15 tablet 0  . predniSONE (DELTASONE) 50 MG tablet Take 1 tablet (50 mg total) by mouth daily with breakfast. (Patient not taking: Reported on 11/12/2015) 5 tablet 0   Labs: Basic Metabolic Panel:  Recent Labs Lab 11/12/15 1355  NA 138  K >7.5*  CL 102  CO2 23  GLUCOSE 80  BUN 43*   CREATININE 10.81*  CALCIUM 10.7*   CBC:  Recent Labs Lab 11/12/15 1355  WBC 9.6  HGB 11.2*  HCT 36.9  MCV 89.6  PLT 178   Studies/Results: Dg Chest Portable 1 View  11/12/2015  CLINICAL DATA:  Shortness of breath. EXAM: PORTABLE CHEST 1 VIEW COMPARISON:  September 17, 2015. FINDINGS: The heart size and mediastinal contours are within normal limits. No pneumothorax or pleural effusion is noted. Left lung is clear. Mild right basilar atelectasis is noted. Dialysis catheter is unchanged in position. The visualized skeletal structures are unremarkable. IMPRESSION: Mild right basilar subsegmental atelectasis. Electronically Signed   By: Marijo Conception, M.D.   On: 11/12/2015 15:45    ROS: As per HPI otherwise negative.   Physical Exam: Filed Vitals:   11/12/15 1526 11/12/15 1600 11/12/15 1615 11/12/15 1630  BP: 223/86 224/83 210/86 214/80  Pulse: 73 71 75 73  Temp:      TempSrc:      Resp: 16 22 20 20   Height:      Weight:      SpO2: 94% 94% 94% 97%     General: Well developed, well nourished, in mild emotional distress. Head: Normocephalic, atraumatic, sclera non-icteric, mucus membranes are moist Neck: Supple. JVD not elevated. Lungs: Bilateral breath sounds decreased in bases tight sounding with coarse breath sounds. Slight WOB present.  Heart: RRR with S1 S2. No murmurs, rubs, or gallops appreciated.SR on monitor.  Abdomen: Soft, non-tender, non-distended with normoactive bowel sounds. No rebound/guarding. No obvious abdominal masses. M-S:  Strength equal UE/LE.  Lower extremities:without edema or ischemic changes, no open wounds  Neuro: Alert and oriented X 3. Moves all extremities spontaneously. Psych:  Responds to questions appropriately with a normal affect. Dialysis Access: LUA HERO Graft + bruit.  Dialysis Orders: [facility] MWF 3 H 45 minutes 400/Auto 1.5 EDW 80 2.0K/2.0 Ca UF profile 4 Heparin 4000 units IV q treatment Hectoral 4 mcg IV q treatment (Last Ca 10  11/07/15  PTH 195 10/24/15) Mircera 50 mcg IV q 2 weeks (last dose 10/31/15 HGB 10.9 11/07/15)   Assessment/Plan: 1.  Hyperkalemia: K+>7.5. Emergent HD on 1 K bath. Monitor serum K+ throughout tx.  2.  ESRD -  MWF @ [place]. Missed HD earlier today D/T weakness.  3.  Hypertension/volume  - Hypertensive at present. Takes amlodipine, metoprolol, losartan and clonidine at home. Wt. 82.9 kg UFG 3000.  4.  Anemia  - HGB stable. No ESA at present. Due for dose this week.  5.  Metabolic bone disease -  Ca 10.7 hold VDRA, No binders on med list. Cont sensipar.  6.  Nutrition - NPO at present. Renal diet when able to eat.   Rita H. Owens Shark, NP-C 11/12/2015, 4:34 PM  [facility] Beeper [phone]  Pt seen, examined and agree  w A/P as above.  Kelly Splinter MD [facility] pager [phone]    cell [phone] 11/12/2015  5:00 PM

## 2015-11-12 NOTE — Progress Notes (Signed)
Tx paused and machine stripped due to clotted venous chamber.  Machine relined and tx resumed.  Added time = 11 minutes.

## 2015-11-12 NOTE — ED Provider Notes (Signed)
Procedures Angiocath insertion Performed by: Allyson Sabal  Consent: Verbal consent obtained. Risks and benefits: risks, benefits and alternatives were discussed Time out: Immediately prior to procedure a "time out" was called to verify the correct patient, procedure, equipment, support staff and site/side marked as required.  Preparation: Patient was prepped and draped in the usual sterile fashion.  Vein Location: R AC  Ultrasound Guided  Gauge: 20  Normal blood return and flush without difficulty Patient tolerance: Patient tolerated the procedure well with no immediate complications.     Karma Greaser, MD 11/12/15 1559  Ripley Fraise, MD 11/12/15 [phone]

## 2015-11-12 NOTE — H&P (Signed)
[facility] Admission History and Physical Service Pager: [phone]  Patient name: [name] Medical record number: [id number] Date of birth: 1940/10/08 Age: 75 y.o. Gender: female  Primary Care Provider: Junie Panning, DO Consultants: Nephrology Code Status: Full (confirmed on admission)  Chief Complaint: Shortness of breath  Assessment and Plan: [name] is a 75 y.o. female presenting with dyspnea and feeling weak in setting of missed dialysis. PMH is significant for ESRD on HD (MWF), anemia, depression, CHF, GERD, hypertension, HLD, tobacco abuse, and fecal incontinence.   SOB: Suspect fluid overload 2/2 inadequate and overdue HD, given elevated blood pressures and lung exam with bibasilar crackles. Mild right basilar atelectasis seen on CXR. No evidence of infection with patient afebrile and without leukocytosis. Maintaining oxygen saturation on RA. Will broaden differential if no improvement after dialysis.  - Admit to telemetry, Attending Dr. Andria Frames - Nephrology consulted for hemodialysis - Continue home breathing treatments of albuterol and ipratroprium 2 puffs q4h prn  - Supplemental O2 to maintain SpO2 >/= 92%.  - PT/OT consult for associated complaint of increased weakness  Hyperkalemia: K>7.5. IV calcium gluconate 1 g and insulin with D50w given in the ED. EKG without peaked T waves.  - Follow labs with dialysis.  - Obtain BMP tomorrow morning after dialysis  ESRD: Above dry weight of 80 at 82.9 kg.  - Continue home cinacalcet, rena-vit, and renvela - Strict I/Os - Daily weights  HTN: Patient unable to confirm home medications. Some contraindications in med rec. Restarted medications, as below using last discharge summary for guidance.  - Restart amlodipine 10 mg, toprol 100 mg, clonidine 0.2 mg TID (prescribed as 0.4 mg TID), and losartan 100 mg (med rec reported patient was taking BID)  HLD: - Continue home aspirin 81 mg and lipitor  40 mg daily  Tobacco abuse: Currently smokes 1.5 PPD. - Ordered 21 mg nicotine patch  Poor appetite: - Consult nutrition for recommendations  Depression: - Continue home abilify 1 mg  Leg spasms: - Contine home lyrica 75 mg daily.   Hx of gout: - Continue allopurinol 100 mg Tues/Thurs/Sat/Sun  Anemia of chronic disease: Hgb 11.2 on admission. Baseline ~ 10-11 - Monitor daily CBCs  FEN/GI: Renal diet, SLIV Prophylaxis: SQ heparin  Disposition: Admitted for urgent dialysis. Dispo pending improvement in electrolytes and SOB.   History of Present Illness:  [name] is a 75 y.o. female presenting with shortness of breath. She states she has not been feeling well for the past few days with tightness in her chest and increased weakness. Has a chronic cough she says is unchanged from baseline. She says she often feels short of breath when her potassium is high. She was not able to have a full dialysis treatment last Friday, 6/9, due to leg cramping. She was due for dialysis today but was not feeling well enough to go. She reports chronic poor appetite, taking only "a couple spoonfuls" of food a day. She reports occasional vomiting but no nausea. She denies trouble taking her medications, as her daughter prepares a medication box for her. Patient is unable to recall current doses of her medications. She says her daughter is now having to do the laundry and take care of the house, as pt does not have enough energy to help. She was last treated for a COPD exacerbation in January 2017.   She was given an albuterol nebulizer treatment in the ED but denies improvement in dyspnea.   Review  Of Systems: Per HPI with the following additions: No chest pain. Denies dysuria or diarrhea.  Otherwise the remainder of the systems were negative.  Patient Active Problem List   Diagnosis Date Noted  . Hyperkalemia 11/12/2015  . Hypertensive urgency 09/20/2015  . Fecal incontinence   . Adverse effects  of medication 06/21/2015  . Herpes 06/19/2015  . Skin lesion 06/16/2015  . Genital ulcer, female 06/16/2015  . COPD exacerbation ([place]) 06/05/2015  . Onychocryptosis 04/08/2015  . Renal mass   . End stage renal disease on dialysis ([place])   . Inadequate pain control 12/30/2014  . CHF (congestive heart failure) ([place]) 12/30/2014  . Meralgia paresthetica of right side 12/26/2014  . Hereditary and idiopathic peripheral neuropathy 02/15/2013  . Dermatitis of face 08/19/2012  . HIP PAIN, BILATERAL 12/07/2008  . BLADDER PROLAPSE 11/17/2007  . OSTEOARTHRITIS, GENERALIZED, MULTIPLE JOINTS 08/18/2007  . 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 ANXIETY STATE, UNSPECIFIED 04/23/2007  . ANEMIA NEC 03/31/2007  . TOBACCO ABUSE 12/10/2006  . HIATAL HERNIA 12/10/2006  . Dysphagia, unspecified(787.20) 12/10/2006  . HYPERCHOLESTEROLEMIA 07/30/2006  . Gout, unspecified 07/30/2006  . DEPRESSIVE DISORDER, NOS 07/30/2006  . HYPERTENSION, BENIGN SYSTEMIC 07/30/2006  . GASTROESOPHAGEAL REFLUX, NO ESOPHAGITIS 07/30/2006    Past Medical History: Past Medical History  Diagnosis Date  . Hyperlipidemia   . Normal cardiac stress test 12/24/2009    lexiscan, imaging normal  . 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 Diverticulitis   . IBS (irritable bowel syndrome)   . Thyroid disease   . Depression   . Chronic kidney disease     Hemo MWF  . GERD (gastroesophageal reflux disease)   . H/O hiatal hernia   . Arthritis   . RLS (restless legs syndrome)   . Tubular adenoma of colon 01/2008  . Seizures ([place])     2004  . Constipation   . Sinus complaint   . Dialysis patient [facility])     kidney  . Renal disorder   . Adrenal mass ([place])   . Hypertension   . High cholesterol   . Back pain   . Meralgia paresthetica of right side 12/26/2014  . Abnormality of gait 03/28/2015  . COPD (chronic obstructive pulmonary disease) ([place])   . TIA (transient ischemic  attack)   . CHF (congestive heart failure) ([place])     Past Surgical History: Past Surgical History  Procedure Laterality Date  . Cardiac catheterization  2003    normal  . Cholecystectomy      Open mid line incision  . Frontal craniotomy  2002    indication = sinusitis  . Appendectomy    . Tubal ligation    . Abdominal hysterectomy    . Insertion of dialysis catheter      Left  . Revison of arteriovenous fistula  05/11/2012    Procedure: REVISON OF ARTERIOVENOUS FISTULA;  Surgeon: Elam Dutch, MD;  Location: [place];  Service: Vascular;  Laterality: Right;  . Av fistula placement Left 11/11/2012    Procedure: INSERTION OF ARTERIOVENOUS (AV) GORE-TEX GRAFT ARM;  Surgeon: Angelia Mould, MD;  Location: [place];  Service: Vascular;  Laterality: Left;  . Avgg removal Left 11/18/2012    Procedure: REMOVAL OF LEFT UPPER ARM ARTERIOVENOUS GORETEX GRAFT ([place]);  Surgeon: Angelia Mould, MD;  Location: [place];  Service: Vascular;  Laterality: Left;  . Patch angioplasty Left 11/18/2012    Procedure: [facility]  ANGIOPLASTY;  Surgeon: Angelia Mould, MD;  Location: [place];  Service: Vascular;  Laterality: Left;  . Frontal cranialcity  2002  . Peripheral vascular catheterization Left 03/08/2015    Procedure: A/V Shuntogram/Fistulagram;  Surgeon: Algernon Huxley, MD;  Location: [place] CV LAB;  Service: Cardiovascular;  Laterality: Left;  . Peripheral vascular catheterization Left 03/08/2015    Procedure: A/V Shunt Intervention;  Surgeon: Algernon Huxley, MD;  Location: [place] CV [facility];  Service: Cardiovascular;  Laterality: Left;  . Anal rectal manometry N/A 07/25/2015    Procedure: ANO RECTAL MANOMETRY;  Surgeon: Mauri Pole, MD;  Location: [facility];  Service: Endoscopy;  Laterality: N/A;  . Rectal ultrasound N/A 10/05/2015    Procedure: ANAL ULTRASOUND WITH PROBE;  Surgeon: Leighton Ruff, MD;  Location: [facility];  Service: Endoscopy;  Laterality: N/A;    Social  History: Social History  Substance Use Topics  . Smoking status: Current Every Day Smoker -- 1.00 packs/day for 60 years    Types: Cigarettes    Start date: 06/16/2014    Last Attempt to Quit: 05/31/2015  . Smokeless tobacco: Never Used     Comment: smoker since 75 yo.  1.5 ppd of [place] 100 lights.   . Alcohol Use: No   Additional social history: Lives with daughter who administers her medications.  Please also refer to relevant sections of EMR.  Family History: Family History  Problem Relation Age of Onset  . Thyroid cancer Mother   . Heart disease Father   . Hypertension Father   . Heart attack Father   . Lupus Daughter   . Breast cancer Sister   . Thyroid cancer Sister     Allergies and Medications: Allergies  Allergen Reactions  . Tuberculin Tests Hives    "blisters"  . Valacyclovir Other (See Comments)    Confusion and nervousness  . Codeine Nausea And Vomiting  . Penicillins Rash    No problems breathing. Has tolerated omnicef in past without issue Has patient had a PCN reaction causing immediate rash, facial/tongue/throat swelling, SOB or lightheadedness with hypotension: Yes Has patient had a PCN reaction causing severe rash involving mucus membranes or skin necrosis: No Has patient had a PCN reaction that required hospitalization No Has patient had a PCN reaction occurring within the last 10 years: No If all of the above answers are "NO", then may proceed with Cephalosporin  . Sulfamethoxazole Rash   No current facility-administered medications on file prior to encounter.   Current Outpatient Prescriptions on File Prior to Encounter  Medication Sig Dispense Refill  . ABILIFY 2 MG tablet Take 0.5 tablets (1 mg total) by mouth daily. 90 tablet 3  . albuterol (PROVENTIL HFA;VENTOLIN HFA) 108 (90 Base) MCG/ACT inhaler Inhale 2 puffs into the lungs every 4 (four) hours as needed for wheezing or shortness of breath. 1 Inhaler 1  . allopurinol (ZYLOPRIM) 100 MG  tablet TAKE ONE TABLET BY MOUTH ONCE DAILY ON THE FOLLOWING DIALYSIS (TUESDAY, THURSDAY, AND SATURDAY) (Patient taking differently: Take 100 mg by mouth once daily on non-dialysis days (Tues/Thurs/Sat/Sun)) 30 tablet 0  . Aluminum & Magnesium Hydroxide 600-300 MG/5ML CONC Take 5 mLs by mouth every 6 (six) hours as needed for indigestion. 150 mL 0  . amLODipine (NORVASC) 10 MG tablet TAKE ONE TABLET BY MOUTH ONCE DAILY 90 tablet 0  . aspirin EC 81 MG tablet Take 81 mg by mouth every morning.    Marland Kitchen atorvastatin (LIPITOR) 40 MG tablet Take 1  tablet (40 mg total) by mouth daily. 90 tablet 3  . cinacalcet (SENSIPAR) 30 MG tablet Take 30 mg by mouth at bedtime.    . cloNIDine (CATAPRES) 0.2 MG tablet Take 2 tablets (0.4 mg total) by mouth 3 (three) times daily. Take 1 tablet in the morning and 2 tablets in the evening (Patient taking differently: Take 0.2 mg by mouth 3 (three) times daily. ) 180 tablet 2  . DULoxetine (CYMBALTA) 30 MG capsule 1 tablet in the morning, 2 in the evening (Patient taking differently: Take 30 mg by mouth every morning. ) 90 capsule 3  . fluticasone (FLONASE) 50 MCG/ACT nasal spray Place 2 sprays into both nostrils daily as needed for allergies or rhinitis.     Marland Kitchen ipratropium (ATROVENT HFA) 17 MCG/ACT inhaler Inhale 2 puffs into the lungs every 4 (four) hours as needed for wheezing. 1 Inhaler 12  . lidocaine-prilocaine (EMLA) cream Apply 1 application topically 3 (three) times a week. Use before dialysis on MWF    . loratadine (CLARITIN) 10 MG tablet Take 1 tablet (10 mg total) by mouth daily as needed for allergies. 90 tablet 3  . metoprolol succinate (TOPROL-XL) 100 MG 24 hr tablet Take 100 mg by mouth at bedtime. Take with or immediately following a meal.    . multivitamin (RENA-VIT) TABS tablet Take 1 tablet by mouth at bedtime. 60 tablet 5  . omeprazole (PRILOSEC) 20 MG capsule Take 20 mg by mouth 2 (two) times daily.    . ondansetron (ZOFRAN) 4 MG tablet Take 1 tablet (4 mg  total) by mouth every 8 (eight) hours as needed for nausea or vomiting. 15 tablet 0  . pregabalin (LYRICA) 75 MG capsule Take 75 mg by mouth every evening.     . senna (SENOKOT) 8.6 MG tablet Take 2 tablets by mouth daily as needed. For constipation    . sevelamer carbonate (RENVELA) 800 MG tablet Take 2,400 mg by mouth 3 (three) times daily with meals as needed (whenever food is consumed).     Marland Kitchen doxycycline (VIBRA-TABS) 100 MG tablet Take 1 tablet (100 mg total) by mouth 2 (two) times daily. (Patient not taking: Reported on 11/12/2015) 20 tablet 0  . losartan (COZAAR) 100 MG tablet Take 1 tablet (100 mg total) by mouth daily. (Patient taking differently: Take 100 mg by mouth 2 (two) times daily. ) 90 tablet 3  . nicotine (NICODERM CQ - DOSED IN MG/24 HOURS) 14 mg/24hr patch Place 1 patch (14 mg total) onto the skin daily. (Patient not taking: Reported on 11/12/2015) 28 patch 3  . predniSONE (DELTASONE) 50 MG tablet Take 1 tablet (50 mg total) by mouth daily with breakfast. (Patient not taking: Reported on 11/12/2015) 5 tablet 0    Objective: BP 224/83 mmHg  Pulse 71  Temp(Src) 98.1 F (36.7 C) (Oral)  Resp 22  Ht 5\' 1"  (1.549 m)  Wt 174 lb (78.926 kg)  BMI 32.89 kg/m2  SpO2 94% Exam: General: Chronically ill appearing woman, resting in bed.  Eyes: PERRLA, EOMI. Darkened sclerae. ENTM: MM very tacky. Oropharynx normal. Neck: Supple, no JVD appreciated Cardiovascular: RRR, S1, S2, no m/r/g Respiratory: Mild crackles across lower lung fields. Moving air well throughout. No increased WOB.  Abdomen: +BS, S, NT, ND. MSK: Moves all spontaneously. Normal tone. Skin: WWP. LUA fistula. Neuro: AOx3. No focal deficits.  Psych: Blunted affect. Answered questions appropriately.   Labs and Imaging: CBC BMET   Recent Labs Lab 11/12/15 1355  WBC 9.6  HGB  11.2*  HCT 36.9  PLT 178    Recent Labs Lab 11/12/15 1355  NA 138  K >7.5*  CL 102  CO2 23  BUN 43*  CREATININE 10.81*  GLUCOSE 80   CALCIUM 10.7*     Dg Chest Portable 1 View  11/12/2015  CLINICAL DATA:  Shortness of breath. EXAM: PORTABLE CHEST 1 VIEW COMPARISON:  September 17, 2015. FINDINGS: The heart size and mediastinal contours are within normal limits. No pneumothorax or pleural effusion is noted. Left lung is clear. Mild right basilar atelectasis is noted. Dialysis catheter is unchanged in position. The visualized skeletal structures are unremarkable. IMPRESSION: Mild right basilar subsegmental atelectasis. Electronically Signed   By: Marijo Conception, M.D.   On: 11/12/2015 15:45    Hillary Corinda Gubler, MD 11/12/2015, 4:31 PM PGY-1, [place] [facility] pager: [phone], text pages welcome

## 2015-11-12 NOTE — ED Provider Notes (Signed)
CSN: [id number]     Arrival date & time 11/12/15  1202 History   First MD Initiated Contact with Patient 11/12/15 1510     Chief Complaint  Patient presents with  . Shortness of Breath  . Vascular Access Problem     Patient is a 75 y.o. female presenting with weakness. The history is provided by the patient.  Weakness This is a new problem. The current episode started more than 2 days ago. The problem occurs daily. The problem has been gradually worsening. Associated symptoms include shortness of breath. The symptoms are aggravated by walking. The symptoms are relieved by rest. She has tried rest for the symptoms. The treatment provided mild relief.  Patient reports feeling generalized weakness over past several days She reports that she only received partial dialysis last Friday (3 days ago) and had to be stopped due to leg pain/cramps She was unable to attend dialysis today She reports fatigue She reports shortness of breath No fever She has nausea and some vomiting   She attends dialysis in [place] is her nephrologist   Past Medical History  Diagnosis Date  . Hyperlipidemia   . Normal cardiac stress test 12/24/2009    lexiscan, imaging normal  . 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 Diverticulitis   . IBS (irritable bowel syndrome)   . Thyroid disease   . Depression   . Chronic kidney disease     Hemo MWF  . GERD (gastroesophageal reflux disease)   . H/O hiatal hernia   . Arthritis   . RLS (restless legs syndrome)   . Tubular adenoma of colon 01/2008  . Seizures ([place])     2004  . Constipation   . Sinus complaint   . Dialysis patient [facility])     kidney  . Renal disorder   . Adrenal mass ([place])   . Hypertension   . High cholesterol   . Back pain   . Meralgia paresthetica of right side 12/26/2014  . Abnormality of gait 03/28/2015  . COPD (chronic obstructive pulmonary disease) ([place])   . TIA (transient ischemic  attack)   . CHF (congestive heart failure) [facility])    Past Surgical History  Procedure Laterality Date  . Cardiac catheterization  2003    normal  . Cholecystectomy      Open mid line incision  . Frontal craniotomy  2002    indication = sinusitis  . Appendectomy    . Tubal ligation    . Abdominal hysterectomy    . Insertion of dialysis catheter      Left  . Revison of arteriovenous fistula  05/11/2012    Procedure: REVISON OF ARTERIOVENOUS FISTULA;  Surgeon: Elam Dutch, MD;  Location: [place];  Service: Vascular;  Laterality: Right;  . Av fistula placement Left 11/11/2012    Procedure: INSERTION OF ARTERIOVENOUS (AV) GORE-TEX GRAFT ARM;  Surgeon: Angelia Mould, MD;  Location: [place];  Service: Vascular;  Laterality: Left;  . Avgg removal Left 11/18/2012    Procedure: REMOVAL OF LEFT UPPER ARM ARTERIOVENOUS GORETEX GRAFT ([place]);  Surgeon: Angelia Mould, MD;  Location: [place];  Service: Vascular;  Laterality: Left;  . Patch angioplasty Left 11/18/2012    Procedure: PATCH ANGIOPLASTY;  Surgeon: Angelia Mould, MD;  Location: [place];  Service: Vascular;  Laterality: Left;  . Frontal cranialcity  2002  . Peripheral vascular catheterization Left 03/08/2015  Procedure: A/V Shuntogram/Fistulagram;  Surgeon: Algernon Huxley, MD;  Location: [place] CV [facility];  Service: Cardiovascular;  Laterality: Left;  . Peripheral vascular catheterization Left 03/08/2015    Procedure: A/V Shunt Intervention;  Surgeon: Algernon Huxley, MD;  Location: [place] CV [facility];  Service: Cardiovascular;  Laterality: Left;  . Anal rectal manometry N/A 07/25/2015    Procedure: ANO RECTAL MANOMETRY;  Surgeon: Mauri Pole, MD;  Location: [facility];  Service: Endoscopy;  Laterality: N/A;  . Rectal ultrasound N/A 10/05/2015    Procedure: ANAL ULTRASOUND WITH PROBE;  Surgeon: Leighton Ruff, MD;  Location: [facility];  Service: Endoscopy;  Laterality: N/A;   Family History  Problem Relation Age of  Onset  . Thyroid cancer Mother   . Heart disease Father   . Hypertension Father   . Heart attack Father   . Lupus Daughter   . Breast cancer Sister   . Thyroid cancer Sister    Social History  Substance Use Topics  . Smoking status: Current Every Day Smoker -- 1.00 packs/day for 60 years    Types: Cigarettes    Start date: 06/16/2014    Last Attempt to Quit: 05/31/2015  . Smokeless tobacco: Never Used     Comment: smoker since 75 yo.  1.5 ppd of [place] 100 lights.   . Alcohol Use: No   OB History    Gravida Para Term Preterm AB TAB SAB Ectopic Multiple Living   5 3 3             Review of Systems  Constitutional: Positive for fatigue. Negative for fever.  Respiratory: Positive for shortness of breath.   Gastrointestinal: Positive for nausea and vomiting.  Neurological: Positive for weakness. Negative for syncope.  All other systems reviewed and are negative.     Allergies  Tuberculin tests; Valacyclovir; Codeine; Penicillins; and Sulfamethoxazole  Home Medications   Prior to Admission medications   Medication Sig Start Date End Date Taking? Authorizing Provider  ABILIFY 2 MG tablet Take 0.5 tablets (1 mg total) by mouth daily. 11/23/14   Coral Spikes, DO  albuterol (PROVENTIL HFA;VENTOLIN HFA) 108 (90 Base) MCG/ACT inhaler Inhale 2 puffs into the lungs every 4 (four) hours as needed for wheezing or shortness of breath. 06/05/15   Ashly Windell Moulding, DO  allopurinol (ZYLOPRIM) 100 MG tablet TAKE ONE TABLET BY MOUTH ONCE DAILY ON THE FOLLOWING DIALYSIS (TUESDAY, THURSDAY, AND SATURDAY) 07/30/15   Nolon Rod, DO  Aluminum & Magnesium Hydroxide 600-300 MG/5ML CONC Take 5 mLs by mouth every 6 (six) hours as needed for indigestion. 11/02/15   Asiyah Cletis Media, MD  amLODipine (NORVASC) 10 MG tablet TAKE ONE TABLET BY MOUTH ONCE DAILY 09/25/14   Coral Spikes, DO  aspirin EC 81 MG tablet Take 81 mg by mouth every morning.    Historical Provider, MD  atorvastatin (LIPITOR) 40 MG  tablet Take 1 tablet (40 mg total) by mouth daily. 02/26/15   [place] N Rumley, DO  cinacalcet (SENSIPAR) 30 MG tablet Take 30 mg by mouth at bedtime.    Historical Provider, MD  cloNIDine (CATAPRES) 0.2 MG tablet Take 2 tablets (0.4 mg total) by mouth 3 (three) times daily. Take 1 tablet in the morning and 2 tablets in the evening 09/18/15   Aquilla Hacker, MD  doxycycline (VIBRA-TABS) 100 MG tablet Take 1 tablet (100 mg total) by mouth 2 (two) times daily. 09/18/15   Aquilla Hacker, MD  DULoxetine (CYMBALTA) 30 MG  capsule 1 tablet in the morning, 2 in the evening 10/17/15   Kathrynn Ducking, MD  fluticasone [facility]) 50 MCG/ACT nasal spray Place 2 sprays into both nostrils daily as needed for allergies or rhinitis.     Historical Provider, MD  folic acid-vitamin b complex-vitamin c-selenium-zinc (DIALYVITE) 3 MG TABS tablet Take 1 tablet by mouth daily.    Historical Provider, MD  ipratropium (ATROVENT HFA) 17 MCG/ACT inhaler Inhale 2 puffs into the lungs every 4 (four) hours as needed for wheezing. 06/05/15   Ashly Windell Moulding, DO  lidocaine-prilocaine (EMLA) cream Apply 1 application topically 3 (three) times a week. Use before dialysis on MWF    Historical Provider, MD  loratadine (CLARITIN) 10 MG tablet Take 1 tablet (10 mg total) by mouth daily as needed for allergies. 09/27/14   Coral Spikes, DO  losartan (COZAAR) 100 MG tablet Take 1 tablet (100 mg total) by mouth daily. 09/27/14   Coral Spikes, DO  metoprolol succinate (TOPROL-XL) 100 MG 24 hr tablet Take 100 mg by mouth at bedtime. Take with or immediately following a meal.    Historical Provider, MD  multivitamin (RENA-VIT) TABS tablet Take 1 tablet by mouth at bedtime. 01/01/15   Alyssa A Haney, MD  nicotine (NICODERM CQ - DOSED IN MG/24 HOURS) 14 mg/24hr patch Place 1 patch (14 mg total) onto the skin daily. 06/08/15   Sangrey N Rumley, DO  omeprazole (PRILOSEC) 20 MG capsule Take 20 mg by mouth 2 (two) times daily.    Historical Provider, MD   ondansetron (ZOFRAN) 4 MG tablet Take 1 tablet (4 mg total) by mouth every 8 (eight) hours as needed for nausea or vomiting. 11/02/15   Asiyah Cletis Media, MD  predniSONE (DELTASONE) 50 MG tablet Take 1 tablet (50 mg total) by mouth daily with breakfast. 09/18/15   Aquilla Hacker, MD  pregabalin (LYRICA) 75 MG capsule Take 75 mg by mouth every evening.     Historical Provider, MD  senna (SENOKOT) 8.6 MG tablet Take 2 tablets by mouth daily as needed. For constipation    Historical Provider, MD  sevelamer carbonate (RENVELA) 800 MG tablet Take 2,400 mg by mouth 3 (three) times daily with meals as needed (whenever food is consumed).     Historical Provider, MD   BP 198/75 mmHg  Pulse 68  Temp(Src) 98.1 F (36.7 C) (Oral)  Resp 20  Ht 5\' 1"  (1.549 m)  Wt 78.926 kg  BMI 32.89 kg/m2  SpO2 98% Physical Exam CONSTITUTIONAL: Elderly, frail HEAD: Normocephalic/atraumatic EYES: EOMI ENMT: Mucous membranes dry NECK: supple no meningeal signs SPINE/BACK:entire spine nontender CV: S1/S2 noted, no murmurs/rubs/gallops noted LUNGS: crackles bilaterally.  No distress noted ABDOMEN: soft, nontender GU:no cva tenderness NEURO: Pt is awake/alert/appropriate, moves all extremitiesx4.  No facial droop.   EXTREMITIES: pulses normal/equal, full ROM, dialysis access to left UE with thrill noted SKIN: warm, color normal PSYCH: no abnormalities of mood noted, alert and oriented to situation  ED Course  Procedures  CRITICAL CARE Performed by: Sharyon Cable Total critical care time: 33 minutes Critical care time was exclusive of separately billable procedures and treating other patients. Critical care was necessary to treat or prevent imminent or life-threatening deterioration. Critical care was time spent personally by me on the following activities: development of treatment plan with patient and/or surrogate as well as nursing, discussions with consultants, evaluation of patient's response to  treatment, examination of patient, obtaining history from patient or surrogate, ordering and performing treatments  and interventions, ordering and review of laboratory studies, ordering and review of radiographic studies, pulse oximetry and re-evaluation of patient's condition. PATIENT WITH HYPERKALEMIA, REQUIRING IV CALCIUM AND ALSO DIALYSIS  3:46 PM Pt seen on arrival to room She has missed dialysis with potassium >7 She will need emergent dialysis I have paged nephrology I have ordered medications 3:56 PM D/w dr Jonnie Finner with kidney Will arrange dialysis 4:06 PM D/w family medicine Will admit to tele after dialysis Patient stabilized in the ER Labs Review Labs Reviewed  BASIC METABOLIC PANEL - Abnormal; Notable for the following:    Potassium >7.5 (*)    BUN 43 (*)    Creatinine, Ser 10.81 (*)    Calcium 10.7 (*)    GFR calc non Af Amer 3 (*)    GFR calc Af Amer 4 (*)    All other components within normal limits  CBC - Abnormal; Notable for the following:    Hemoglobin 11.2 (*)    RDW 17.6 (*)    All other components within normal limits    Imaging Review Dg Chest Portable 1 View  11/12/2015  CLINICAL DATA:  Shortness of breath. EXAM: PORTABLE CHEST 1 VIEW COMPARISON:  September 17, 2015. FINDINGS: The heart size and mediastinal contours are within normal limits. No pneumothorax or pleural effusion is noted. Left lung is clear. Mild right basilar atelectasis is noted. Dialysis catheter is unchanged in position. The visualized skeletal structures are unremarkable. IMPRESSION: Mild right basilar subsegmental atelectasis. Electronically Signed   By: Marijo Conception, M.D.   On: 11/12/2015 15:45   I have personally reviewed and evaluated these images and lab results as part of my medical decision-making.   EKG Interpretation   Date/Time:  Monday November 12 2015 13:33:43 EDT Ventricular Rate:  72 PR Interval:  232 QRS Duration: 94 QT Interval:  400 QTC Calculation: 438 R Axis:    109 Text Interpretation:  Sinus rhythm with sinus arrhythmia with 1st degree  A-V block Rightward axis Cannot rule out Anterior infarct , age  undetermined Abnormal ECG Confirmed by Christy Gentles  MD, Bentli Llorente ([id number]) on  11/12/2015 3:11:48 PM     Medications  albuterol (PROVENTIL) (2.5 MG/3ML) 0.083% nebulizer solution (not administered)  albuterol (PROVENTIL) (2.5 MG/3ML) 0.083% nebulizer solution 5 mg (5 mg Nebulization Given 11/12/15 1334)  calcium gluconate inj 10% (1 g) URGENT USE ONLY! (1 g Intravenous Given 11/12/15 1601)  insulin aspart (novoLOG) injection 10 Units (10 Units Intravenous Given 11/12/15 1601)  dextrose 50 % solution 50 mL (50 mLs Intravenous Given 11/12/15 1601)    MDM   Final diagnoses:  Hyperkalemia  Renal failure    Nursing notes including past medical history and social history reviewed and considered in documentation Labs/vital reviewed myself and considered during evaluation xrays/imaging reviewed by myself and considered during evaluation     Ripley Fraise, MD 11/12/15 1607

## 2015-11-13 DIAGNOSIS — N186 End stage renal disease: Secondary | ICD-10-CM | POA: Insufficient documentation

## 2015-11-13 DIAGNOSIS — I160 Hypertensive urgency: Secondary | ICD-10-CM

## 2015-11-13 LAB — BASIC METABOLIC PANEL
Anion gap: 11 (ref 5–15)
BUN: 15 mg/dL (ref 6–20)
CHLORIDE: 96 mmol/L — AB (ref 101–111)
CO2: 28 mmol/L (ref 22–32)
CREATININE: 6.11 mg/dL — AB (ref 0.44–1.00)
Calcium: 10.4 mg/dL — ABNORMAL HIGH (ref 8.9–10.3)
GFR calc Af Amer: 7 mL/min — ABNORMAL LOW (ref >60)
GFR calc non Af Amer: 6 mL/min — ABNORMAL LOW (ref >60)
GLUCOSE: 78 mg/dL (ref 65–99)
Potassium: 4.9 mmol/L (ref 3.5–5.1)
Sodium: 135 mmol/L (ref 135–145)

## 2015-11-13 LAB — CBC
HCT: 36 % (ref 36.0–46.0)
HEMOGLOBIN: 11.1 g/dL — AB (ref 12.0–15.0)
MCH: 27.1 pg (ref 26.0–34.0)
MCHC: 30.8 g/dL (ref 30.0–36.0)
MCV: 88 fL (ref 78.0–100.0)
Platelets: 177 10*3/uL (ref 150–400)
RBC: 4.09 MIL/uL (ref 3.87–5.11)
RDW: 17.3 % — ABNORMAL HIGH (ref 11.5–15.5)
WBC: 9.8 10*3/uL (ref 4.0–10.5)

## 2015-11-13 MED ORDER — DULOXETINE HCL 30 MG PO CPEP: 30.0000 mg | ORAL_CAPSULE | Freq: Every morning | ORAL | Status: DC

## 2015-11-13 MED ORDER — CLONIDINE HCL 0.2 MG PO TABS
0.2000 mg | ORAL_TABLET | Freq: Three times a day (TID) | ORAL | Status: DC
Start: 2015-11-13 — End: 2016-11-11

## 2015-11-13 MED ORDER — AMLODIPINE BESYLATE 10 MG PO TABS: 10.0000 mg | ORAL_TABLET | Freq: Every day | ORAL | Status: DC

## 2015-11-13 MED ORDER — ALLOPURINOL 100 MG PO TABS: ORAL_TABLET | ORAL | Status: AC

## 2015-11-13 MED ORDER — LOSARTAN POTASSIUM 100 MG PO TABS: 100.0000 mg | ORAL_TABLET | Freq: Every day | ORAL | Status: DC

## 2015-11-13 NOTE — Evaluation (Signed)
Occupational Therapy Evaluation and Discharge  Patient Details Name: [name] MRN: [id number] DOB: November 12, 1940 Today's Date: 11/13/2015    History of Present Illness HPI: [name] is a 75 y.o. 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 PMH significant for hypertension, COPD, HF, GERD, IBS, tubluar adenoma of colon presented to ED today with C/O of feeling weak and pain in feet. Patient shortened hemodialysis treatment last Friday d/t restless leg symptoms, pain and cramping in feet. She did attend her normal volunteer activities on Saturday morning but continued to feel weak with SOB; found to have hyperkalemia and atelectasis   Clinical Impression   Patient admitted with above. Patient independent to mod I PTA. Patient currently functioning at an overall mod I level for increased time due to being sleepy and tired, pt reports she didn't sleep well last night.  No additional OT needs identified, D/C from acute OT services and no additional follow-up OT needs at this time. All appropriate education provided to patient. Please re-order OT if needed.      Follow Up Recommendations  No OT follow up;Supervision - Intermittent    Equipment Recommendations  None recommended by OT    Recommendations for Other Services  None at this time    Precautions / Restrictions Precautions Precautions: None    Mobility Bed Mobility Overal bed mobility: Modified Independent General bed mobility comments: Got up to EOB without difficulty; used rails  Transfers Overall transfer level: Independent General transfer comment: No difficulties    Balance Overall balance assessment: No apparent balance deficits (not formally assessed)    ADL Overall ADL's : Modified independent;At baseline General ADL Comments: increased time needed, no AD needed during OT eval. Pt reports she uses a shower seat for showers and has a handicap height toilet seat for toileting needs.     Pertinent  Vitals/Pain Pain Assessment: No/denies pain     Hand Dominance Right   Extremity/Trunk Assessment Upper Extremity Assessment Upper Extremity Assessment: Overall WFL for tasks assessed;Generalized weakness   Lower Extremity Assessment Lower Extremity Assessment: Defer to PT evaluation   Cervical / Trunk Assessment Cervical / Trunk Assessment: Kyphotic   Communication Communication Communication: No difficulties   Cognition Arousal/Alertness: Awake/alert Behavior During Therapy: WFL for tasks assessed/performed Overall Cognitive Status: Within Functional Limits for tasks assessed              Home Living Family/patient expects to be discharged to:: Private residence Living Arrangements: Other relatives Available Help at Discharge: Family;Available PRN/intermittently Type of Home: House Home Access: Level entry     Home Layout: One level     Bathroom Shower/Tub: Teacher, early years/pre: Handicapped height     Home Equipment: Cane - single point;Shower seat;Grab bars - tub/shower   Prior Functioning/Environment Level of Independence: Independent  Comments: Volunteers; drives; uses a cane occasionally out in the community    OT Diagnosis: Generalized weakness   OT Problem List:   N/a, no acute OT needs identified at this time     OT Treatment/Interventions:   N/a, no acute OT needs identified at this time     OT Goals(Current goals can be found in the care plan section) Acute Rehab OT Goals Patient Stated Goal: get some rest OT Goal Formulation: All assessment and education complete, DC therapy  OT Frequency:   N/a, no acute OT needs identified at this time     Barriers to D/C:  None known at this time  End of Session Activity Tolerance: Patient tolerated treatment well Patient left: in bed;with call bell/phone within reach   Time: 1036-1046 OT Time Calculation (min): 10 min Charges:  OT General Charges $OT Visit: 1 Procedure OT  Evaluation $OT Eval Low Complexity: 1 Procedure  Chrys Racer , MS, OTR/L, CLT Pager: [phone]  11/13/2015, 10:54 AM

## 2015-11-13 NOTE — Progress Notes (Signed)
Initial Nutrition Assessment  DOCUMENTATION CODES:   Obesity unspecified  INTERVENTION:  Recommend nutritional supplementation at home.  Plans for d/c today.   NUTRITION DIAGNOSIS:   Increased nutrient needs related to chronic illness as evidenced by estimated needs.  GOAL:   Patient will meet greater than or equal to 90% of their needs  MONITOR:   PO intake, Weight trends, Labs, I & O's  REASON FOR ASSESSMENT:   Consult Poor PO  ASSESSMENT:   [name] is a 74 y.o. female presenting with dyspnea and feeling weak in setting of missed dialysis, now resolved after HD. PMH is significant for ESRD on HD (MWF), anemia, depression, Diastolic HF (ECHO w/normal EF and G1DD) , GERD, hypertension, HLD, tobacco abuse, and fecal incontinence.   Pt reports appetite has improved. No percent meal completion recorded however. Pt reports she has been experiencing early satiety due to her hiatal hernia. She reports she has been only consuming 1 meal a day with Ensure on occasions. Pt does report that she has an appointment coming up with GI to look at her hernia. Pt reports having gradual weight loss. Per Epic weight records, weight loss not significant. Plans for discharge today. Pt educated on the importance of adequate protein intake and to consume nutritional supplementation at home.   Pt with no significant fat or muscle mass loss.   Labs and medications reviewed.   Diet Order:  Diet renal with fluid restriction Fluid restriction:: 1200 mL Fluid; Room service appropriate?: Yes; Fluid consistency:: Thin  Skin:  Reviewed, no issues  Last BM:  Unknown  Height:   Ht Readings from Last 1 Encounters:  11/12/15 5\' 1"  (1.549 m)    Weight:   Wt Readings from Last 1 Encounters:  11/12/15 178 lb 5.6 oz (80.9 kg)    Ideal Body Weight:  47.7 kg  BMI:  Body mass index is 33.72 kg/(m^2).  Estimated Nutritional Needs:   Kcal:  1750-1900  Protein:  90-105 grams  Fluid:  1.2  L/day  EDUCATION NEEDS:   Education needs addressed  Corrin Parker, MS, RD, LDN Pager # [phone] After hours/ weekend pager # [phone]

## 2015-11-13 NOTE — Progress Notes (Signed)
Family Medicine Teaching Service Daily Progress Note Intern Pager: [phone]  Patient name: [name] Medical record number: [id number] Date of birth: 12/13/1940 Age: 75 y.o. Gender: female  Primary Care Provider: Junie Panning, DO Consultants: nephrology  Code Status: FULL  Pt Overview and Major Events to Date:  6/12: Admit for fluid overload in the setting of missed dialysis  Assessment and Plan: RASHAWN BRASIER is a 75 y.o. female presenting with dyspnea and feeling weak in setting of missed dialysis, now resolved after HD. PMH is significant for ESRD on HD (MWF), anemia, depression, Diastolic HF (ECHO w/normal EF and G1DD) , GERD, hypertension, HLD, tobacco abuse, and fecal incontinence.   Fluid Overload in the setting of missed dialysis, resolved:  Mild right basilar atelectasis seen on CXR.  - Nephrology consulted: HD 6/12 - Continue home breathing treatments of albuterol and ipratroprium 2 puffs q4h prn  - Supplemental O2 to maintain SpO2 >/= 92%.  - PT/OT consult for associated complaint of increased weakness  Hyperkalemia, resolved: Resolved with dialysis.  K 4.9 this AM  - Follow labs with dialysis.   ESRD: At dry weight of 80kg this AM. HD 6/12.  - Continue home cinacalcet, rena-vit, and renvela - Strict I/Os - Daily weights  HTN: Patient reports she takes Clonidine 0.2mg  BID at home. BP still slightly elevated, but improved from admissin - Amlodipine 10 mg, toprolXL 150 mg, clonidine 0.2 mg TID (prescribed as 0.4 mg TID), and losartan 100 mg   HLD: - home aspirin 81 mg and lipitor 40 mg daily  Tobacco abuse: Currently smokes 1.5 PPD. - 21 mg nicotine patch  Poor appetite: - Consult nutrition for recommendations  Depression: - Continue home abilify 1 mg  Leg spasms: - Contine home lyrica 75 mg daily.   Hx of gout: - Continue allopurinol 100 mg Tues/Thurs/Sat/Sun  Anemia of chronic disease: Hgb 11.2 on admission. Stable this AM. Baseline ~ 10-11 -  Monitor daily CBCs  FEN/GI: Renal diet, SLIV Prophylaxis: SQ heparin  Disposition: likely discharge today, after PT   Subjective:  Patient reports her breathing is back at baseline. No events overnight. Did have HD yesterday evening due to fluid overload and hyperkalemia.   Objective: Temp:  [98 F (36.7 C)-98.4 F (36.9 C)] 98.4 F (36.9 C) (06/13 0525) Pulse Rate:  [68-97] 68 (06/13 0525) Resp:  [15-23] 16 (06/13 0525) BP: (151-244)/(58-120) 172/58 mmHg (06/13 0525) SpO2:  [93 %-100 %] 100 % (06/13 0525) Weight:  [78.926 kg (174 lb)-82.9 kg (182 lb 12.2 oz)] 80.9 kg (178 lb 5.6 oz) (06/12 2111) Physical Exam: General: NAD Cardiovascular: mild systolic murmur noted ([id number]), RRR; no JVD Respiratory: no increased WOB, CTAB  Abdomen: soft, NT, ND Extremities: No LE edema  Laboratory:  Recent Labs Lab 11/12/15 1355 11/12/15 1808 11/13/15 0349  WBC 9.6  --  9.8  HGB 11.2* 12.2 11.1*  HCT 36.9 36.0 36.0  PLT 178  --  177    Recent Labs Lab 11/12/15 1355 11/12/15 1808 11/13/15 0349  NA 138 140 135  K >7.5* 4.8 4.9  CL 102 103 96*  CO2 23  --  28  BUN 43* 28* 15  CREATININE 10.81* 7.40* 6.11*  CALCIUM 10.7*  --  10.4*  GLUCOSE 80 63* 78     Imaging/Diagnostic Tests: CXR:  FINDINGS: The heart size and mediastinal contours are within normal limits. No pneumothorax or pleural effusion is noted. Left lung is clear. Mild right basilar atelectasis is noted. Dialysis catheter is  unchanged in position. The visualized skeletal structures are unremarkable.  IMPRESSION: Mild right basilar subsegmental atelectasis.  Smiley Houseman, MD 11/13/2015, 8:15 AM PGY-1, [place] Intern pager: [phone], text pages welcome

## 2015-11-13 NOTE — Evaluation (Signed)
Physical Therapy Evaluation Patient Details Name: [name] MRN: [id number] DOB: Jul 29, 1940 Today's Date: 11/13/2015   History of Present Illness  HPI: [name] is a 75 y.o. 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 PMH significant for hypertension, COPD, HF, GERD, IBS, tubluar adenoma of colon presented to ED today with C/O of feeling weak and pain in feet. Patient shortened hemodialysis treatment last Friday d/t restless leg symptoms, pain and cramping in feet. She did attend her normal volunteer activities on Saturday morning but continued to feel weak with SOB; found to have hyperkalemia and atelectasis  Clinical Impression   Pt admitted with above diagnosis. Pt currently with functional limitations due to the deficits listed below (see PT Problem List).  Pt will benefit from skilled PT to increase their independence and safety with mobility to allow discharge to the venue listed below.    Activity tolerance today limited by fatigue, but I anticipate very good progress back to her normal, which is community ambulation and volunteering.     Follow Up Recommendations No PT follow up    Equipment Recommendations  None recommended by PT    Recommendations for Other Services       Precautions / Restrictions Precautions Precautions: None      Mobility  Bed Mobility Overal bed mobility: Modified Independent             General bed mobility comments: Got up to EOB without difficulty; used rails  Transfers Overall transfer level: Independent               General transfer comment: No difficulties  Ambulation/Gait Ambulation/Gait assistance: Supervision Ambulation Distance (Feet): 18 Feet Assistive device: None Gait Pattern/deviations: Step-through pattern;Decreased step length - right;Decreased step length - left     General Gait Details: Pt opted for in-room walking only; Overall managing quite well though fatigued and wanting to get back to  bed  Stairs            Wheelchair Mobility    Modified Rankin (Stroke Patients Only)       Balance Overall balance assessment: No apparent balance deficits (not formally assessed)                                           Pertinent Vitals/Pain Pain Assessment: No/denies pain    Home Living Family/patient expects to be discharged to:: Private residence Living Arrangements: Other relatives Available Help at Discharge: Family;Available PRN/intermittently   Home Access: Level entry     Home Layout: One level Home Equipment: Cane - single point      Prior Function Level of Independence: Independent         Comments: Volunteers; drives; uses a cane occasionally out in the community     Hand Dominance        Extremity/Trunk Assessment   Upper Extremity Assessment: Overall WFL for tasks assessed           Lower Extremity Assessment: Overall WFL for tasks assessed (for in-room walking)         Communication   Communication: No difficulties  Cognition Arousal/Alertness: Awake/alert Behavior During Therapy: WFL for tasks assessed/performed Overall Cognitive Status: Within Functional Limits for tasks assessed                      General Comments General comments (skin integrity, edema,  etc.): We discussed PT role in acute care, goals of getting back to normal, which is community access and volunteering with the [employer]; Encouraged pt to spend time daily out of bed to combat the effects of bedrest    Exercises        Assessment/Plan    PT Assessment Patient needs continued PT services  PT Diagnosis Generalized weakness (Fatigue)   PT Problem List Decreased strength;Decreased activity tolerance  PT Treatment Interventions DME instruction;Gait training;Stair training;Functional mobility training;Therapeutic activities;Therapeutic exercise;Patient/family education   PT Goals (Current goals can be found  in the Care Plan section) Acute Rehab PT Goals Patient Stated Goal: back to normal PT Goal Formulation: With patient Time For Goal Achievement: 11/20/15 Potential to Achieve Goals: Good    Frequency Min 3X/week   Barriers to discharge        Co-evaluation               End of Session Equipment Utilized During Treatment:  (pt declined gait belt) Activity Tolerance: Patient limited by fatigue Patient left: in bed;with call bell/phone within reach           Time: 0855-0903 PT Time Calculation (min) (ACUTE ONLY): 8 min   Charges:   PT Evaluation $PT Eval Low Complexity: 1 Procedure     PT G CodesQuin Hoop 11/13/2015, 9:20 AM  [name], PT  Acute Rehabilitation Services Pager [phone] Office [phone]

## 2015-11-13 NOTE — Discharge Instructions (Signed)
You were admitted to the hospital for shortness of breath which was thought to be due to fluid overload due to a shorted dialysis session on Friday and a missed dialysis earlier on the day you presented to the hospital. Your symptoms improved after getting dialysis. You also had elevated blood pressure.  The following are your blood pressure medications that you should take: Amlodipine 10mg  at bedtime -- you received this medication this morning (6/13) so do not take it tonight (6/13). Please take this medication starting tomorrow night (6/14) Metoprolol 150mg  at bedtime Clonidine 0.2mg  three times daily  Losartan 100mg  daily

## 2015-11-13 NOTE — Discharge Summary (Signed)
[facility] Discharge Summary  Patient name: [name] Medical record number: [id number] Date of birth: 1940/07/20 Age: 75 y.o. Gender: female Date of Admission: 11/12/2015  Date of Discharge: 11/13/15 Admitting Physician: Zenia Resides, MD  Primary Care Provider: Junie Panning, DO Consultants: Nephrology  Indication for Hospitalization: shortness of breath  Discharge Diagnoses/Problem List:  ESRD HTN Fluid overload   Disposition: home  Discharge Condition: improved, stable  Discharge Exam:  General: NAD Cardiovascular: mild systolic murmur noted ([id number]), RRR; no JVD Respiratory: no increased WOB, CTAB  Abdomen: soft, NT, ND Extremities: No LE edema  Brief Hospital Course:  [name] is a 75 yo female who presented with shortness of breath. Her PMH is significant for ESRD on HD (MWF), anemia, depression, Diastolic HF (ECHO w/normal EF and G1DD) , GERD, hypertension, HLD, tobacco abuse, and fecal incontinence.  Her shortness of breath was thought to be due to fluid overload in the setting of shortened dialysis treatment on 6/9 and missed dialysis earlier on the day of presentation. On admission, labs were significant for Potassium level >7.5. She was given calcium gluconate EKG showed mild peaked T waves. On exam she had elevated blood pressures and bibasilar crackles. She was taken for emergent dialysis the evening of presentation. Her symptoms resolved after dialysis and hyperkalemia also resolved. Repeat EKG showed resolution of mild peaked T waves.   HTN: Patient's blood pressure was elevated on admission. She reported that she took Clonidine 0.2 BID at home although she was prescribed 0.2mg  TID per her chart. Additionally she reported taking Losartan 100mg  BID although she was prescribed 100mg  daily. She also took Amlodipine 10mg  and Toprol XL 150mg . She was discharged with the following: Toprol XL 150 mg at bedtime, Norvasc 10mg  at bedtime,  Clonidine 0.2mg  TID, and Losartan 100mg  daily.  Patient is to take Norvasc and Metorpolol at bedtime per nephrology recommendations to avoid hypotension.   Issues for Follow Up:  - patient will likely need further titration of blood pressure medications  Significant Procedures: none  Significant Labs and Imaging:   Recent Labs Lab 11/12/15 1355 11/12/15 1808 11/13/15 0349  WBC 9.6  --  9.8  HGB 11.2* 12.2 11.1*  HCT 36.9 36.0 36.0  PLT 178  --  177    Recent Labs Lab 11/12/15 1355 11/12/15 1808 11/13/15 0349  NA 138 140 135  K >7.5* 4.8 4.9  CL 102 103 96*  CO2 23  --  28  GLUCOSE 80 63* 78  BUN 43* 28* 15  CREATININE 10.81* 7.40* 6.11*  CALCIUM 10.7*  --  10.4*   CXR: FINDINGS: The heart size and mediastinal contours are within normal limits. No pneumothorax or pleural effusion is noted. Left lung is clear. Mild right basilar atelectasis is noted. Dialysis catheter is unchanged in position. The visualized skeletal structures are unremarkable.  IMPRESSION: Mild right basilar subsegmental atelectasis.  Results/Tests Pending at Time of Discharge: none  Discharge Medications:    Medication List    STOP taking these medications        doxycycline 100 MG tablet  Commonly known as:  VIBRA-TABS     nicotine 14 mg/24hr patch  Commonly known as:  NICODERM CQ - dosed in mg/24 hours     predniSONE 50 MG tablet  Commonly known as:  DELTASONE     sevelamer carbonate 800 MG tablet  Commonly known as:  RENVELA      TAKE these medications  ABILIFY 2 MG tablet  Generic drug:  ARIPiprazole  Take 0.5 tablets (1 mg total) by mouth daily.     acetaminophen 500 MG tablet  Commonly known as:  TYLENOL  Take 1,000 mg by mouth every 6 (six) hours as needed for mild pain or moderate pain.     albuterol 108 (90 Base) MCG/ACT inhaler  Commonly known as:  PROVENTIL HFA;VENTOLIN HFA  Inhale 2 puffs into the lungs every 4 (four) hours as needed for wheezing or  shortness of breath.     allopurinol 100 MG tablet  Commonly known as:  ZYLOPRIM  Take 100 mg by mouth once daily on non-dialysis days (Tues/Thurs/Sat/Sun)     Aluminum & Magnesium Hydroxide 600-300 MG/5ML Conc  Take 5 mLs by mouth every 6 (six) hours as needed for indigestion.     amLODipine 10 MG tablet  Commonly known as:  NORVASC  Take 1 tablet (10 mg total) by mouth at bedtime.     aspirin EC 81 MG tablet  Take 81 mg by mouth every morning.     atorvastatin 40 MG tablet  Commonly known as:  LIPITOR  Take 1 tablet (40 mg total) by mouth daily.     cinacalcet 30 MG tablet  Commonly known as:  SENSIPAR  Take 30 mg by mouth at bedtime.     cloNIDine 0.2 MG tablet  Commonly known as:  CATAPRES  Take 1 tablet (0.2 mg total) by mouth 3 (three) times daily.     DULoxetine 30 MG capsule  Commonly known as:  CYMBALTA  Take 1 capsule (30 mg total) by mouth every morning.     fluticasone 50 MCG/ACT nasal spray  Commonly known as:  FLONASE  Place 2 sprays into both nostrils daily as needed for allergies or rhinitis.     ipratropium 17 MCG/ACT inhaler  Commonly known as:  ATROVENT HFA  Inhale 2 puffs into the lungs every 4 (four) hours as needed for wheezing.     lidocaine-prilocaine cream  Commonly known as:  EMLA  Apply 1 application topically 3 (three) times a week. Use before dialysis on MWF     loratadine 10 MG tablet  Commonly known as:  CLARITIN  Take 1 tablet (10 mg total) by mouth daily as needed for allergies.     losartan 100 MG tablet  Commonly known as:  COZAAR  Take 1 tablet (100 mg total) by mouth daily.     metoprolol succinate 100 MG 24 hr tablet  Commonly known as:  TOPROL-XL  Take 100 mg by mouth at bedtime. Take with or immediately following a meal.     metoprolol succinate 50 MG 24 hr tablet  Commonly known as:  TOPROL-XL  Take 50 mg by mouth at bedtime.     multivitamin Tabs tablet  Take 1 tablet by mouth at bedtime.     omeprazole 20 MG  capsule  Commonly known as:  PRILOSEC  Take 20 mg by mouth 2 (two) times daily.     ondansetron 4 MG tablet  Commonly known as:  ZOFRAN  Take 1 tablet (4 mg total) by mouth every 8 (eight) hours as needed for nausea or vomiting.     pregabalin 75 MG capsule  Commonly known as:  LYRICA  Take 75 mg by mouth every evening.     senna 8.6 MG tablet  Commonly known as:  SENOKOT  Take 2 tablets by mouth daily as needed. For constipation  Discharge Instructions: Please refer to Patient Instructions section of EMR for full details.  Patient was counseled important signs and symptoms that should prompt return to medical care, changes in medications, dietary instructions, activity restrictions, and follow up appointments.   Follow-Up Appointments: Follow-up Information    Follow up with Clearance Coots, MD On 11/22/2015.   Specialty:  Family Medicine   Why:  at 8:45AM for hospital follow up    Contact information:   [place] [phone] [phone]       Smiley Houseman, MD 11/14/2015, 8:16 PM PGY-1, [place]

## 2015-11-13 NOTE — Progress Notes (Signed)
[facility] Progress Note   Subjective: "I was feeling good this morning but now I feel a little weak".  Sitting up at bedside. Denies SOB. States she will probably go home today.   Objective Filed Vitals:   11/12/15 2114 11/12/15 2145 11/13/15 0525 11/13/15 1000  BP: 194/83 151/106 172/58 167/58  Pulse: 87 88 68 63  Temp:  98.4 F (36.9 C) 98.4 F (36.9 C) 98.6 F (37 C)  TempSrc:  Oral Oral Oral  Resp:   16 18  Height:      Weight:      SpO2:  98% 100% 100%   Physical Exam General: Pleasant, appears tired, NAD Heart: S1, S2, RRR Lungs: BBS few coarse breath sounds otherwise CTA A/P Abdomen: Active BS X 4, nontender Extremities: No LE edema.  Dialysis Access: LUA HERO Graft + bruit.  Dialysis Orders: [facility] MWF 3 H 45 minutes 400/Auto 1.5 EDW 80 2.0K/2.0 Ca UF profile 4 Heparin 4000 units IV q treatment Hectoral 4 mcg IV q treatment (Last Ca 10 11/07/15 PTH 195 10/24/15) Mircera 50 mcg IV q 2 weeks (last dose 10/31/15 HGB 10.9 11/07/15)  Additional Objective Labs: Basic Metabolic Panel:  Recent Labs Lab 11/12/15 1355 11/12/15 1808 11/13/15 0349  NA 138 140 135  K >7.5* 4.8 4.9  CL 102 103 96*  CO2 23  --  28  GLUCOSE 80 63* 78  BUN 43* 28* 15  CREATININE 10.81* 7.40* 6.11*  CALCIUM 10.7*  --  10.4*   CBC:  Recent Labs Lab 11/12/15 1355 11/12/15 1808 11/13/15 0349  WBC 9.6  --  9.8  HGB 11.2* 12.2 11.1*  HCT 36.9 36.0 36.0  MCV 89.6  --  88.0  PLT 178  --  177   Blood Culture    Component Value Date/Time   SDES URINE, CATHETERIZED 11/14/2013 1549   SPECREQUEST NONE 11/14/2013 1549   CULT NO GROWTH Performed at [facility] 11/14/2013 1549   REPTSTATUS 11/15/2013 FINAL 11/14/2013 1549    CBG:  Recent Labs Lab 11/12/15 1849 11/12/15 1946  GLUCAP 65 154*   @[email or web address] @ Studies/Results: Dg Chest Portable 1 View  11/12/2015  CLINICAL DATA:  Shortness of breath. EXAM: PORTABLE CHEST 1 VIEW COMPARISON:  September 17, 2015. FINDINGS: The heart size and mediastinal contours are within normal limits. No pneumothorax or pleural effusion is noted. Left lung is clear. Mild right basilar atelectasis is noted. Dialysis catheter is unchanged in position. The visualized skeletal structures are unremarkable. IMPRESSION: Mild right basilar subsegmental atelectasis. Electronically Signed   By: Marijo Conception, M.D.   On: 11/12/2015 15:45   Medications:   . amLODipine  10 mg Oral Daily  . ARIPiprazole  1 mg Oral Daily  . aspirin EC  81 mg Oral q morning - 10a  . atorvastatin  40 mg Oral Daily  . cinacalcet  30 mg Oral Q supper  . cloNIDine  0.2 mg Oral TID  . DULoxetine  30 mg Oral q morning - 10a  . heparin  4,000 Units Dialysis Once in dialysis  . heparin  5,000 Units Subcutaneous Q8H  . losartan  100 mg Oral Daily  . metoprolol succinate  150 mg Oral QHS  . multivitamin  1 tablet Oral QHS  . nicotine  21 mg Transdermal Daily  . pantoprazole  40 mg Oral Daily  . pregabalin  75 mg Oral QPM    Assessment/Plan: 1. Hyperkalemia: K+>7.5. Emergent HD on 1 K bath 11/12/15. K+  4.8. Now resolved. Have discussed potassium restrictions with pt.  2. ESRD - MWF @ [place]. HD tomorrow in center. No change in orders.  3. Hypertension/volume - Hypertensive at present. Antihypertensives have been restarted however the schedule is incorrect. Takes amlodipine, metoprolol q HS.  4. Anemia - HGB stable. No ESA at present. Due for dose this week.  5. Metabolic bone disease - Ca 10.7 hold VDRA on DC, No binders on med list. Cont sensipar.  6. Nutrition -renal diet. Will have dietician F/U with pt in dialysis unit for K+ restrictions.   Rita H. Owens Shark, NP-C 11/12/2015, 4:34 PM  [place] [phone]  Pt seen, examined and agree w A/P as above.  Kelly Splinter MD [employer] pager [phone]    cell [phone] 11/13/2015, 12:13 PM

## 2015-11-13 NOTE — Care Management Important Message (Signed)
Important Message  Patient Details  Name: [name] MRN: [id number] Date of Birth: January 14, 1941   Medicare Important Message Given:  Yes    Loann Quill 11/13/2015, 8:55 AM

## 2015-11-13 NOTE — Progress Notes (Signed)
[name] to be D/C'd Home per MD order.  Discussed prescriptions and follow up appointments with the patient. Prescriptions given to patient, medication list explained in detail. Pt verbalized understanding.    Medication List    STOP taking these medications        doxycycline 100 MG tablet  Commonly known as:  VIBRA-TABS     nicotine 14 mg/24hr patch  Commonly known as:  NICODERM CQ - dosed in mg/24 hours     predniSONE 50 MG tablet  Commonly known as:  DELTASONE     sevelamer carbonate 800 MG tablet  Commonly known as:  RENVELA      TAKE these medications        ABILIFY 2 MG tablet  Generic drug:  ARIPiprazole  Take 0.5 tablets (1 mg total) by mouth daily.     acetaminophen 500 MG tablet  Commonly known as:  TYLENOL  Take 1,000 mg by mouth every 6 (six) hours as needed for mild pain or moderate pain.     albuterol 108 (90 Base) MCG/ACT inhaler  Commonly known as:  PROVENTIL HFA;VENTOLIN HFA  Inhale 2 puffs into the lungs every 4 (four) hours as needed for wheezing or shortness of breath.     allopurinol 100 MG tablet  Commonly known as:  ZYLOPRIM  Take 100 mg by mouth once daily on non-dialysis days (Tues/Thurs/Sat/Sun)     Aluminum & Magnesium Hydroxide 600-300 MG/5ML Conc  Take 5 mLs by mouth every 6 (six) hours as needed for indigestion.     amLODipine 10 MG tablet  Commonly known as:  NORVASC  Take 1 tablet (10 mg total) by mouth at bedtime.     aspirin EC 81 MG tablet  Take 81 mg by mouth every morning.     atorvastatin 40 MG tablet  Commonly known as:  LIPITOR  Take 1 tablet (40 mg total) by mouth daily.     cinacalcet 30 MG tablet  Commonly known as:  SENSIPAR  Take 30 mg by mouth at bedtime.     cloNIDine 0.2 MG tablet  Commonly known as:  CATAPRES  Take 1 tablet (0.2 mg total) by mouth 3 (three) times daily.     DULoxetine 30 MG capsule  Commonly known as:  CYMBALTA  Take 1 capsule (30 mg total) by mouth every morning.     fluticasone  50 MCG/ACT nasal spray  Commonly known as:  FLONASE  Place 2 sprays into both nostrils daily as needed for allergies or rhinitis.     ipratropium 17 MCG/ACT inhaler  Commonly known as:  ATROVENT HFA  Inhale 2 puffs into the lungs every 4 (four) hours as needed for wheezing.     lidocaine-prilocaine cream  Commonly known as:  EMLA  Apply 1 application topically 3 (three) times a week. Use before dialysis on MWF     loratadine 10 MG tablet  Commonly known as:  CLARITIN  Take 1 tablet (10 mg total) by mouth daily as needed for allergies.     losartan 100 MG tablet  Commonly known as:  COZAAR  Take 1 tablet (100 mg total) by mouth daily.     metoprolol succinate 100 MG 24 hr tablet  Commonly known as:  TOPROL-XL  Take 100 mg by mouth at bedtime. Take with or immediately following a meal.     metoprolol succinate 50 MG 24 hr tablet  Commonly known as:  TOPROL-XL  Take 50 mg by mouth at bedtime.  multivitamin Tabs tablet  Take 1 tablet by mouth at bedtime.     omeprazole 20 MG capsule  Commonly known as:  PRILOSEC  Take 20 mg by mouth 2 (two) times daily.     ondansetron 4 MG tablet  Commonly known as:  ZOFRAN  Take 1 tablet (4 mg total) by mouth every 8 (eight) hours as needed for nausea or vomiting.     pregabalin 75 MG capsule  Commonly known as:  LYRICA  Take 75 mg by mouth every evening.     senna 8.6 MG tablet  Commonly known as:  SENOKOT  Take 2 tablets by mouth daily as needed. For constipation        Filed Vitals:   11/13/15 0525 11/13/15 1000  BP: 172/58 167/58  Pulse: 68 63  Temp: 98.4 F (36.9 C) 98.6 F (37 C)  Resp: 16 18    Skin clean, dry and intact without evidence of skin break down, no evidence of skin tears noted. IV catheter discontinued intact. Site without signs and symptoms of complications. Dressing and pressure applied. Pt denies pain at this time. No complaints noted.  An After Visit Summary was printed and given to the  patient. Patient escorted via [place], and D/C home via private auto.  [name] 11/13/2015 12:38 PM

## 2015-11-14 DIAGNOSIS — E875 Hyperkalemia: Secondary | ICD-10-CM | POA: Diagnosis not present

## 2015-11-14 DIAGNOSIS — Z283 Underimmunization status: Secondary | ICD-10-CM | POA: Diagnosis not present

## 2015-11-14 DIAGNOSIS — N2581 Secondary hyperparathyroidism of renal origin: Secondary | ICD-10-CM | POA: Diagnosis not present

## 2015-11-14 DIAGNOSIS — Z23 Encounter for immunization: Secondary | ICD-10-CM | POA: Diagnosis not present

## 2015-11-14 DIAGNOSIS — N186 End stage renal disease: Secondary | ICD-10-CM | POA: Diagnosis not present

## 2015-11-15 ENCOUNTER — Ambulatory Visit: Payer: Medicare Other

## 2015-11-15 NOTE — Telephone Encounter (Signed)
Pt called said it is not on the RX bottle the new directions Dr Jannifer Franklin talked to her about and her daughter will not let her take more than 1 tablet a day. Please call.

## 2015-11-15 NOTE — Telephone Encounter (Addendum)
Dr Jaynee Eagles- please advise. I do not see an rx sent in by Dr Jannifer Franklin for this previously. Looks like different provider sent in rx on 11/13/15 for cymbalta for only once daily.

## 2015-11-16 ENCOUNTER — Other Ambulatory Visit: Payer: Self-pay | Admitting: Neurology

## 2015-11-16 DIAGNOSIS — Z283 Underimmunization status: Secondary | ICD-10-CM | POA: Diagnosis not present

## 2015-11-16 DIAGNOSIS — N2581 Secondary hyperparathyroidism of renal origin: Secondary | ICD-10-CM | POA: Diagnosis not present

## 2015-11-16 DIAGNOSIS — Z23 Encounter for immunization: Secondary | ICD-10-CM | POA: Diagnosis not present

## 2015-11-16 DIAGNOSIS — E875 Hyperkalemia: Secondary | ICD-10-CM | POA: Diagnosis not present

## 2015-11-16 DIAGNOSIS — N186 End stage renal disease: Secondary | ICD-10-CM | POA: Diagnosis not present

## 2015-11-16 MED ORDER — DULOXETINE HCL 30 MG PO CPEP: 30.0000 mg | ORAL_CAPSULE | Freq: Two times a day (BID) | ORAL | Status: DC

## 2015-11-16 NOTE — Telephone Encounter (Signed)
Called and spoke w/ pt's daughter. Let her know that new rx was sent in to pharmacy for Cymbalta twice a day. Says that pt has trouble sleeping when taking med at night. Recommended that she take first dose early in the morning and second dose in the afternoon or early evening so not close to bedtime. Daughter did say again that she is only going to give medications according to instructions that are printed on the bottle.

## 2015-11-16 NOTE — Telephone Encounter (Signed)
Dr.Willis' last note stated "She will be placed on Cymbalta taking 30 mg daily for 2 weeks, then go to 30 mg twice daily"  I will increase her Cymbalta to 30mg  twice daily, please call patient thanks.

## 2015-11-19 DIAGNOSIS — Z23 Encounter for immunization: Secondary | ICD-10-CM | POA: Diagnosis not present

## 2015-11-19 DIAGNOSIS — N186 End stage renal disease: Secondary | ICD-10-CM | POA: Diagnosis not present

## 2015-11-19 DIAGNOSIS — Z283 Underimmunization status: Secondary | ICD-10-CM | POA: Diagnosis not present

## 2015-11-19 DIAGNOSIS — N2581 Secondary hyperparathyroidism of renal origin: Secondary | ICD-10-CM | POA: Diagnosis not present

## 2015-11-19 DIAGNOSIS — E875 Hyperkalemia: Secondary | ICD-10-CM | POA: Diagnosis not present

## 2015-11-20 ENCOUNTER — Ambulatory Visit: Payer: Medicare Other

## 2015-11-21 DIAGNOSIS — Z23 Encounter for immunization: Secondary | ICD-10-CM | POA: Diagnosis not present

## 2015-11-21 DIAGNOSIS — Z283 Underimmunization status: Secondary | ICD-10-CM | POA: Diagnosis not present

## 2015-11-21 DIAGNOSIS — N2581 Secondary hyperparathyroidism of renal origin: Secondary | ICD-10-CM | POA: Diagnosis not present

## 2015-11-21 DIAGNOSIS — N186 End stage renal disease: Secondary | ICD-10-CM | POA: Diagnosis not present

## 2015-11-21 DIAGNOSIS — E875 Hyperkalemia: Secondary | ICD-10-CM | POA: Diagnosis not present

## 2015-11-22 ENCOUNTER — Encounter: Payer: Self-pay | Admitting: Family Medicine

## 2015-11-22 ENCOUNTER — Ambulatory Visit (INDEPENDENT_AMBULATORY_CARE_PROVIDER_SITE_OTHER): Payer: Medicare Other | Admitting: Family Medicine

## 2015-11-22 ENCOUNTER — Ambulatory Visit (INDEPENDENT_AMBULATORY_CARE_PROVIDER_SITE_OTHER): Payer: Medicare Other | Admitting: Licensed Clinical Social Worker

## 2015-11-22 VITALS — BP 163/53 | HR 67 | Temp 98.6°F | Ht 61.0 in | Wt 177.4 lb

## 2015-11-22 DIAGNOSIS — I10 Essential (primary) hypertension: Secondary | ICD-10-CM

## 2015-11-22 DIAGNOSIS — Z789 Other specified health status: Secondary | ICD-10-CM

## 2015-11-22 DIAGNOSIS — R0602 Shortness of breath: Secondary | ICD-10-CM | POA: Diagnosis not present

## 2015-11-22 DIAGNOSIS — F17200 Nicotine dependence, unspecified, uncomplicated: Secondary | ICD-10-CM

## 2015-11-22 DIAGNOSIS — G479 Sleep disorder, unspecified: Secondary | ICD-10-CM | POA: Diagnosis not present

## 2015-11-22 NOTE — Progress Notes (Signed)
Subjective:    [name] - 75 y.o. female MRN [id number]  Date of birth: [birth year]  CC SOB  HPI  [name] is here for SOB. PMH is significant for ESRD on HD (MWF), anemia, depression, Diastolic HF (ECHO w/normal EF and G1DD) , GERD, hypertension, HLD, tobacco abuse, and fecal incontinence.  She was recently admitted for shortness of breath.  Her SOB was secondary to fluid overload.  She was taken emergently to dialysis and had resolution of her symptoms.  Her symptoms were thought be related to her shortened dialysis session as well as a missed dialysis session.  She has a history of HTN and her Bp was elevated on admission.  She was discharged on Toprol XL 150 mg at bedtime, Norvasc 10mg  at bedtime, Clonidine 0.2mg  TID, and Losartan 100mg  daily.  Patient is to take Norvasc and Metorpolol at bedtime per nephrology recommendations to avoid hypotension.  Trouble sleeping.  Getting worse  Has been going on for a couple of months  Some time she can fall asleep before the sun comes up She sits up most night now.  She isn't able to sleep during the day She may sleep an hour during her dialysis days.  Her children report she snores a little.  Reports she has never had these symptoms before.  Reports her lifestyle is better than what it was.  She has been talking with our social worker, Casimer Lanius.  She was started on cymbalta by her neurologist for having crampiness in her legs.  Neurologist suggests that she has restless leg syndrome.  Cymbalta was started about 4-6 months ago. Having spasms in her legs at night for the past 6 months.  Some nights she doesn't have spasm and still can't sleep.  She sits in one spot when she comes home from dialysis before she goes to bed.   PHQ-9: 3  PMH: ESRD 2/2 HTN on HD (MWF), anemia, depression, Diastolic HF (ECHO w/normal EF and G1DD) , GERD, hypertension, HLD, tobacco abuse, and fecal incontinenc SH: denies any alcohol. Still  active tobacco use FH: no history of sleep apnea   Health Maintenance:  Health Maintenance Due  Topic Date Due  . ZOSTAVAX  12/01/2001  . DEXA SCAN  12/02/2006  . PNA vac Low Risk Adult (1 of 2 - PCV13) 12/02/2006  . MAMMOGRAM  08/12/2015    Review of Systems See HPI     Objective:   Physical Exam BP 163/53 mmHg  Pulse 67  Temp(Src) 98.6 F (37 C) (Oral)  Ht 5\' 1"  (1.549 m)  Wt 177 lb 6.4 oz (80.468 kg)  BMI 33.54 kg/m2 Gen: NAD, alert, cooperative with exam,  CV: RRR, good S1/S2, no murmur, no edema, Resp: CTABL, no wheezes, non-labored Skin: no rashes, normal turgor  Neuro: no gross deficits.  Psych:  alert and oriented   Assessment & Plan:   HYPERTENSION, BENIGN SYSTEMIC She hasn't taken Bp meds today  Reports bp in the 120's while at dialysis sessions  The nephrologist monitors her bp while she is there  Continue current regimen for now.   TOBACCO ABUSE Currently reports this is the only thing she finds joy in since quitting gambling.  Will continue to discuss the possibility of quitting in the future.   Shortness of breath Acute episode that was resolved with dialysis while she was admitted.  No crackles or trouble breathing today  Has not missed any dialysis sessions.   Trouble in sleeping She reports  a poor bedtime routine  Has a lack of exercise and other activities  PHQ -9: 3 but has a history of depressive d/o  Provided sleep hygiene Encouraged melatonin 30 min prior to bed if no improvement in sleep with sleep hygiene Currently taking cymbalta for RLS  May need to try trazodone in the future if no improvement .

## 2015-11-22 NOTE — Progress Notes (Signed)
Patient ID: [name], female   DOB: [birth year], 75 y.o.   MRN: [id number]   Reason for follow-up: Continue Intervention to address symptoms associated with addictive behavior, stress at home and coping skills. Patient is pleasant and engaged. Her goals were reviewed with no adjustments made. Patient denies relapse with gambling, however admits to fantasies about winning.  Interventions utilized today: Motivational Interviewing. The following issues were discussed: . Building trust . Family stressors . Managing and coping with stressors . Pros of new behavior  . Reasons not to relapse  . "People, Places and things" . Stages of change  (maintaining and action stage) Plan: Patient is in agreement to continue with [facility] to assist with developing coping skills to sustain her new behavior.  She will follow up with [facility] in 2 to [place]. LCSW Clinical Social Work, [place]   [phone] 12:07 PM

## 2015-11-22 NOTE — Patient Instructions (Signed)
Thank you for coming in,   Sleep is an integral part of our bodies ability to recover from our daily activities and is key to making you body perform at its maximal potential.  Establishing and maintaining a healthy sleep pattern can not only make you feel better it can help many chronic illnesses including high blood pressure, high cholesterol, obesity, chronic pain syndromes, and many others.  Some key things to remember regarding sleep are:  Establish a consistent nightly routine that you do each night before bed.  Avoid caffeine, tobacco and alcohol as all of these drugs will cause sleep disturbances.    Establish an exercise routine.  This can be as simple as walking, dancing or what every you find gets your heart rate elevated to the point you can speak in only 3-4 word sentences.  Exercise will help make falling and staying asleep easier.    If you have difficulty with sleep, reserve the bedroom for sleeping; do not watch TV, read, eat or exercise in your bed room.      - Watching TV in bed can trick your brain into thinking it is day time and will reset your internal clock.  If you are going to watch TV before bed do so outside of the bedroom.  Although it is best to avoid screens for the hour prior to bed that is difficult to do in our current technology driven society - at the very least it is imperative that you remove TV from the bed room.      - If you have a hard time falling asleep avoid showering and exercising prior to bed.    If you have no improvement with the sleep hygiene then you can get melatonin and try half of a tablet before you go to bed.   If there is still no improvement with these interventions then please follow up.   Please bring all of your medications with you to each visit.   Health maintenance items that are due.  Health Maintenance  Topic Date Due  . Shingles Vaccine  12/01/2001  . DEXA scan (bone density measurement)  12/02/2006  . Pneumonia vaccines (1  of 2 - PCV13) 12/02/2006  . Mammogram  08/12/2015  . Flu Shot  01/01/2016  . Tetanus Vaccine  08/01/2018  . Colon Cancer Screening  08/28/2018     Sign up for My Chart to have easy access to your labs results, and communication with your Primary care physician   Please feel free to call with any questions or concerns at any time, at [phone]. --Dr. Raeford Razor

## 2015-11-22 NOTE — Assessment & Plan Note (Signed)
Acute episode that was resolved with dialysis while she was admitted.  No crackles or trouble breathing today  Has not missed any dialysis sessions.

## 2015-11-22 NOTE — Assessment & Plan Note (Signed)
She hasn't taken Bp meds today  Reports bp in the 120's while at dialysis sessions  The nephrologist monitors her bp while she is there  Continue current regimen for now.

## 2015-11-22 NOTE — Assessment & Plan Note (Signed)
Currently reports this is the only thing she finds joy in since quitting gambling.  Will continue to discuss the possibility of quitting in the future.

## 2015-11-22 NOTE — Assessment & Plan Note (Signed)
She reports a poor bedtime routine  Has a lack of exercise and other activities  PHQ -9: 3 but has a history of depressive d/o  Provided sleep hygiene Encouraged melatonin 30 min prior to bed if no improvement in sleep with sleep hygiene Currently taking cymbalta for RLS  May need to try trazodone in the future if no improvement .

## 2015-11-23 DIAGNOSIS — E875 Hyperkalemia: Secondary | ICD-10-CM | POA: Diagnosis not present

## 2015-11-23 DIAGNOSIS — N2581 Secondary hyperparathyroidism of renal origin: Secondary | ICD-10-CM | POA: Diagnosis not present

## 2015-11-23 DIAGNOSIS — N186 End stage renal disease: Secondary | ICD-10-CM | POA: Diagnosis not present

## 2015-11-23 DIAGNOSIS — Z23 Encounter for immunization: Secondary | ICD-10-CM | POA: Diagnosis not present

## 2015-11-23 DIAGNOSIS — Z283 Underimmunization status: Secondary | ICD-10-CM | POA: Diagnosis not present

## 2015-11-26 DIAGNOSIS — Z283 Underimmunization status: Secondary | ICD-10-CM | POA: Diagnosis not present

## 2015-11-26 DIAGNOSIS — N2581 Secondary hyperparathyroidism of renal origin: Secondary | ICD-10-CM | POA: Diagnosis not present

## 2015-11-26 DIAGNOSIS — E875 Hyperkalemia: Secondary | ICD-10-CM | POA: Diagnosis not present

## 2015-11-26 DIAGNOSIS — Z23 Encounter for immunization: Secondary | ICD-10-CM | POA: Diagnosis not present

## 2015-11-26 DIAGNOSIS — N186 End stage renal disease: Secondary | ICD-10-CM | POA: Diagnosis not present

## 2015-11-28 DIAGNOSIS — E875 Hyperkalemia: Secondary | ICD-10-CM | POA: Diagnosis not present

## 2015-11-28 DIAGNOSIS — Z23 Encounter for immunization: Secondary | ICD-10-CM | POA: Diagnosis not present

## 2015-11-28 DIAGNOSIS — N2581 Secondary hyperparathyroidism of renal origin: Secondary | ICD-10-CM | POA: Diagnosis not present

## 2015-11-28 DIAGNOSIS — Z283 Underimmunization status: Secondary | ICD-10-CM | POA: Diagnosis not present

## 2015-11-28 DIAGNOSIS — N186 End stage renal disease: Secondary | ICD-10-CM | POA: Diagnosis not present

## 2015-11-30 DIAGNOSIS — Z992 Dependence on renal dialysis: Secondary | ICD-10-CM | POA: Diagnosis not present

## 2015-11-30 DIAGNOSIS — N2581 Secondary hyperparathyroidism of renal origin: Secondary | ICD-10-CM | POA: Diagnosis not present

## 2015-11-30 DIAGNOSIS — E875 Hyperkalemia: Secondary | ICD-10-CM | POA: Diagnosis not present

## 2015-11-30 DIAGNOSIS — N186 End stage renal disease: Secondary | ICD-10-CM | POA: Diagnosis not present

## 2015-11-30 DIAGNOSIS — Z23 Encounter for immunization: Secondary | ICD-10-CM | POA: Diagnosis not present

## 2015-11-30 DIAGNOSIS — E1129 Type 2 diabetes mellitus with other diabetic kidney complication: Secondary | ICD-10-CM | POA: Diagnosis not present

## 2015-11-30 DIAGNOSIS — Z283 Underimmunization status: Secondary | ICD-10-CM | POA: Diagnosis not present

## 2015-12-03 DIAGNOSIS — N186 End stage renal disease: Secondary | ICD-10-CM | POA: Diagnosis not present

## 2015-12-03 DIAGNOSIS — N2581 Secondary hyperparathyroidism of renal origin: Secondary | ICD-10-CM | POA: Diagnosis not present

## 2015-12-05 ENCOUNTER — Ambulatory Visit (INDEPENDENT_AMBULATORY_CARE_PROVIDER_SITE_OTHER): Payer: Medicare Other | Admitting: Family Medicine

## 2015-12-05 ENCOUNTER — Encounter: Payer: Self-pay | Admitting: Family Medicine

## 2015-12-05 VITALS — BP 180/63 | HR 67 | Temp 98.1°F | Wt 181.0 lb

## 2015-12-05 DIAGNOSIS — M79605 Pain in left leg: Secondary | ICD-10-CM | POA: Diagnosis not present

## 2015-12-05 DIAGNOSIS — M79604 Pain in right leg: Secondary | ICD-10-CM | POA: Insufficient documentation

## 2015-12-05 MED ORDER — PREDNISONE 50 MG PO TABS: 50.0000 mg | ORAL_TABLET | Freq: Every day | ORAL | Status: DC

## 2015-12-05 NOTE — Patient Instructions (Signed)
It was a pleasure seeing you today in our clinic. Today we discussed your bilateral leg pain. Here is the treatment plan we have discussed and agreed upon together:   - I provided you with a prescription of prednisone. Take 1 tablet every day for the next 7 days. - I have placed an order for physical therapy. He will be contacted to set up an appointment with one of the nearby physical therapy establishments. - If you have any questions or concerns do not hesitate to contact our office and your PCP will get back to you once they have looked over your inquiry.

## 2015-12-05 NOTE — Progress Notes (Signed)
HPI  CC: leg pain Been acutely painful for 2-3 weeks. Bilateral. Causes her to be in extreme pain. Never stops hurting. Starts in hip, anterior thigh, knee, and calf. Pain is burning in nature. No "electric shock down leg". Pain makes walking difficult.  No recent fevers, chills, headache, new weakness, new numbness, paresthesias.  Some fecal incontinence x~10 months >> being followed by GI.  Review of Systems   See HPI for ROS. All other systems reviewed and are negative.  CC, SH/smoking status, and VS noted  Objective: BP 180/63 mmHg  Pulse 67  Temp(Src) 98.1 F (36.7 C) (Oral)  Wt 181 lb (82.101 kg)  SpO2 97% Gen: NAD, alert, cooperative, and pleasant. Back Exam: No bony deformity, no evidence of rash or erythema, extreme tenderness to palpation along the SI joints, greater trochanter, anterior quadriceps, and knees (all bilaterally, L>R). No pain or tenderness in the groin with movement of the hips. SLR elicits pain but no radiation beyond the knee. Strength 4/5 in all directions bilaterally. DTRs difficult to assess as patient had extreme pain with the patellar reflex due to the impact of the hammer on the surface of the skin. The rest of exam was significantly limited due to patient's intolerance to the pain elicited. Neuro: Alert and oriented, Speech clear, No gross deficits  Assessment and plan:  Leg pain, bilateral Patient is complaining of bilateral upper leg pain which is been relatively chronic with an acute flare 2-3 weeks ago. Etiology currently unknown. However, after physical exam I believe the pain is likely neuropathic in nature as pain is described as burning and occasionally sharp. She describes slight improvement in pain when standing and complains of intense pain with very superficial palpation of the hips, thighs, and knees. Vascular etiology/claudication considered due to her medical history however this was deemed unlikely as she does not have any pain distal to  her knees and this pain is worsened when laying down and seated. Arthritic pain would be in the differential however the superficial nature of this pain as well as the lack of any discomfort in her groin makes arthritic pain unlikely--with that said there may be some evidence of SI joint inflammation, however this is not likely to be causing all of her pain. Patient does have fecal incontinence this is been going on for many months and has been getting followed by GI. Because this is been going on longer than the pain and she has relatively recent MRI imaging of her L-spine I do not believe this could be considered a red flag symptom. - Patient is our the on many of the medications I have considered for her neuropathic pain. (Lyrica, Cymbalta, Voltaren gel, lidocaine cream) - When inquired, patient did state she had some improvement in her pain when she was placed on a prednisone burst for her COPD. >> I have written a prescription for 7 days of prednisone 50 mg. - I find it relatively unnerving that, according to the patient, she has never been referred to physical therapy for this pain. >> PT order placed today.   Orders Placed This Encounter  Procedures  . Ambulatory referral to Physical Therapy    Referral Priority:  Routine    Referral Type:  Physical Medicine    Referral Reason:  Specialty Services Required    Requested Specialty:  Physical Therapy    Number of Visits Requested:  1    Meds ordered this encounter  Medications  . predniSONE (DELTASONE) 50 MG tablet  Sig: Take 1 tablet (50 mg total) by mouth daily with breakfast.    Dispense:  7 tablet    Refill:  0     Elberta Leatherwood, MD,MS,  PGY3 12/05/2015 5:50 PM

## 2015-12-05 NOTE — Assessment & Plan Note (Signed)
Patient is complaining of bilateral upper leg pain which is been relatively chronic with an acute flare 2-3 weeks ago. Etiology currently unknown. However, after physical exam I believe the pain is likely neuropathic in nature as pain is described as burning and occasionally sharp. She describes slight improvement in pain when standing and complains of intense pain with very superficial palpation of the hips, thighs, and knees. Vascular etiology/claudication considered due to her medical history however this was deemed unlikely as she does not have any pain distal to her knees and this pain is worsened when laying down and seated. Arthritic pain would be in the differential however the superficial nature of this pain as well as the lack of any discomfort in her groin makes arthritic pain unlikely--with that said there may be some evidence of SI joint inflammation, however this is not likely to be causing all of her pain. Patient does have fecal incontinence this is been going on for many months and has been getting followed by GI. Because this is been going on longer than the pain and she has relatively recent MRI imaging of her L-spine I do not believe this could be considered a red flag symptom. - Patient is our the on many of the medications I have considered for her neuropathic pain. (Lyrica, Cymbalta, Voltaren gel, lidocaine cream) - When inquired, patient did state she had some improvement in her pain when she was placed on a prednisone burst for her COPD. >> I have written a prescription for 7 days of prednisone 50 mg. - I find it relatively unnerving that, according to the patient, she has never been referred to physical therapy for this pain. >> PT order placed today.

## 2015-12-06 DIAGNOSIS — N2581 Secondary hyperparathyroidism of renal origin: Secondary | ICD-10-CM | POA: Diagnosis not present

## 2015-12-06 DIAGNOSIS — N186 End stage renal disease: Secondary | ICD-10-CM | POA: Diagnosis not present

## 2015-12-07 DIAGNOSIS — N186 End stage renal disease: Secondary | ICD-10-CM | POA: Diagnosis not present

## 2015-12-07 DIAGNOSIS — N2581 Secondary hyperparathyroidism of renal origin: Secondary | ICD-10-CM | POA: Diagnosis not present

## 2015-12-10 DIAGNOSIS — N182 Chronic kidney disease, stage 2 (mild): Secondary | ICD-10-CM | POA: Diagnosis not present

## 2015-12-10 DIAGNOSIS — T82858D Stenosis of vascular prosthetic devices, implants and grafts, subsequent encounter: Secondary | ICD-10-CM | POA: Diagnosis not present

## 2015-12-10 DIAGNOSIS — I871 Compression of vein: Secondary | ICD-10-CM | POA: Diagnosis not present

## 2015-12-10 DIAGNOSIS — Z992 Dependence on renal dialysis: Secondary | ICD-10-CM | POA: Diagnosis not present

## 2015-12-11 DIAGNOSIS — N186 End stage renal disease: Secondary | ICD-10-CM | POA: Diagnosis not present

## 2015-12-11 DIAGNOSIS — N2581 Secondary hyperparathyroidism of renal origin: Secondary | ICD-10-CM | POA: Diagnosis not present

## 2015-12-12 DIAGNOSIS — N2581 Secondary hyperparathyroidism of renal origin: Secondary | ICD-10-CM | POA: Diagnosis not present

## 2015-12-12 DIAGNOSIS — N186 End stage renal disease: Secondary | ICD-10-CM | POA: Diagnosis not present

## 2015-12-13 ENCOUNTER — Ambulatory Visit: Payer: Medicare Other

## 2015-12-13 ENCOUNTER — Ambulatory Visit: Payer: Medicare Other | Admitting: Neurology

## 2015-12-13 ENCOUNTER — Ambulatory Visit (INDEPENDENT_AMBULATORY_CARE_PROVIDER_SITE_OTHER): Payer: Medicare Other | Admitting: Neurology

## 2015-12-13 ENCOUNTER — Encounter: Payer: Self-pay | Admitting: Neurology

## 2015-12-13 VITALS — BP 153/68 | HR 65 | Ht 61.0 in | Wt 181.5 lb

## 2015-12-13 DIAGNOSIS — M549 Dorsalgia, unspecified: Secondary | ICD-10-CM | POA: Diagnosis not present

## 2015-12-13 DIAGNOSIS — G5711 Meralgia paresthetica, right lower limb: Secondary | ICD-10-CM | POA: Diagnosis not present

## 2015-12-13 DIAGNOSIS — G8929 Other chronic pain: Secondary | ICD-10-CM | POA: Diagnosis not present

## 2015-12-13 DIAGNOSIS — G609 Hereditary and idiopathic neuropathy, unspecified: Secondary | ICD-10-CM | POA: Diagnosis not present

## 2015-12-13 NOTE — Progress Notes (Signed)
Reason for visit: Peripheral neuropathy  [name] is an 75 y.o. female  History of present illness:   [name] is a 75 year old right-handed black female with a history of a peripheral neuropathy, chronic low back pain associated with spondylosis, and bilateral leg pain. The patient has not had any falls since last seen. She continues to have discomfort in the back and legs, she has burning and icy hot sensations in the legs, she has restless leg syndrome. She is not sleeping well at night. She will use a cane for ambulation at times. She has had significant sensitivity of the hands and shoulders, MRI of the cervical spine was done that did not show any spinal cord compression or other significant spondylosis. The patient has been placed on Cymbalta, she is on 30 mg daily. She has Ultram to take for pain. She recently was placed on a course of prednisone which seemed to help her back and leg pain. She will be ending the prednisone dosing today. She has been set up for physical therapy, this has not yet been started.  Past Medical History  Diagnosis Date  . Hyperlipidemia   . Normal cardiac stress test 12/24/2009    lexiscan, imaging normal  . 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 Diverticulitis   . IBS (irritable bowel syndrome)   . Thyroid disease   . Depression   . Chronic kidney disease     Hemo MWF  . GERD (gastroesophageal reflux disease)   . H/O hiatal hernia   . Arthritis   . RLS (restless legs syndrome)   . Tubular adenoma of colon 01/2008  . Seizures ([employer])     2004  . Constipation   . Sinus complaint   . Dialysis patient [facility])     kidney  . Renal disorder   . Adrenal mass ([place])   . Hypertension   . High cholesterol   . Back pain   . Meralgia paresthetica of right side 12/26/2014  . Abnormality of gait 03/28/2015  . COPD (chronic obstructive pulmonary disease) ([place])   . TIA (transient ischemic attack)   . CHF (congestive  heart failure) [facility])     Past Surgical History  Procedure Laterality Date  . Cardiac catheterization  2003    normal  . Cholecystectomy      Open mid line incision  . Frontal craniotomy  2002    indication = sinusitis  . Appendectomy    . Tubal ligation    . Abdominal hysterectomy    . Insertion of dialysis catheter      Left  . Revison of arteriovenous fistula  05/11/2012    Procedure: REVISON OF ARTERIOVENOUS FISTULA;  Surgeon: Elam Dutch, MD;  Location: [place];  Service: Vascular;  Laterality: Right;  . Av fistula placement Left 11/11/2012    Procedure: INSERTION OF ARTERIOVENOUS (AV) GORE-TEX GRAFT ARM;  Surgeon: Angelia Mould, MD;  Location: [place];  Service: Vascular;  Laterality: Left;  . Avgg removal Left 11/18/2012    Procedure: REMOVAL OF LEFT UPPER ARM ARTERIOVENOUS GORETEX GRAFT ([place]);  Surgeon: Angelia Mould, MD;  Location: [place];  Service: Vascular;  Laterality: Left;  . Patch angioplasty Left 11/18/2012    Procedure: PATCH ANGIOPLASTY;  Surgeon: Angelia Mould, MD;  Location: [place];  Service: Vascular;  Laterality: Left;  . Frontal cranialcity  2002  . Peripheral vascular catheterization Left 03/08/2015  Procedure: A/V Shuntogram/Fistulagram;  Surgeon: Algernon Huxley, MD;  Location: [place] CV [facility];  Service: Cardiovascular;  Laterality: Left;  . Peripheral vascular catheterization Left 03/08/2015    Procedure: A/V Shunt Intervention;  Surgeon: Algernon Huxley, MD;  Location: [place] [facility];  Service: Cardiovascular;  Laterality: Left;  . Anal rectal manometry N/A 07/25/2015    Procedure: ANO RECTAL MANOMETRY;  Surgeon: Mauri Pole, MD;  Location: [facility];  Service: Endoscopy;  Laterality: N/A;  . Rectal ultrasound N/A 10/05/2015    Procedure: ANAL ULTRASOUND WITH PROBE;  Surgeon: Leighton Ruff, MD;  Location: [facility];  Service: Endoscopy;  Laterality: N/A;    Family History  Problem Relation Age of Onset  . Thyroid cancer  Mother   . Heart disease Father   . Hypertension Father   . Heart attack Father   . Lupus Daughter   . Breast cancer Sister   . Thyroid cancer Sister     Social history:  reports that she has been smoking Cigarettes.  She started smoking about 17 months ago. She has a 60 pack-year smoking history. She has never used smokeless tobacco. She reports that she does not drink alcohol or use illicit drugs.    Allergies  Allergen Reactions  . Tuberculin Tests Hives    "blisters"  . Valacyclovir Other (See Comments)    Confusion and nervousness  . Codeine Nausea And Vomiting  . Penicillins Rash    No problems breathing. Has tolerated omnicef in past without issue Has patient had a PCN reaction causing immediate rash, facial/tongue/throat swelling, SOB or lightheadedness with hypotension: Yes Has patient had a PCN reaction causing severe rash involving mucus membranes or skin necrosis: No Has patient had a PCN reaction that required hospitalization No Has patient had a PCN reaction occurring within the last 10 years: No If all of the above answers are "NO", then may proceed with Cephalosporin  . Sulfamethoxazole Rash    Medications:  Prior to Admission medications   Medication Sig Start Date End Date Taking? Authorizing Provider  ABILIFY 2 MG tablet Take 0.5 tablets (1 mg total) by mouth daily. 11/23/14  Yes Coral Spikes, DO  acetaminophen (TYLENOL) 500 MG tablet Take 1,000 mg by mouth every 6 (six) hours as needed for mild pain or moderate pain.   Yes Historical Provider, MD  albuterol (PROVENTIL HFA;VENTOLIN HFA) 108 (90 Base) MCG/ACT inhaler Inhale 2 puffs into the lungs every 4 (four) hours as needed for wheezing or shortness of breath. 06/05/15  Yes Ashly Windell Moulding, DO  allopurinol (ZYLOPRIM) 100 MG tablet Take 100 mg by mouth once daily on non-dialysis days (Tues/Thurs/Sat/Sun) 11/13/15  Yes Smiley Houseman, MD  Aluminum & Magnesium Hydroxide 600-300 MG/5ML CONC Take 5 mLs by mouth  every 6 (six) hours as needed for indigestion. 11/02/15  Yes Asiyah Cletis Media, MD  amLODipine (NORVASC) 10 MG tablet Take 1 tablet (10 mg total) by mouth at bedtime. 11/13/15  Yes Smiley Houseman, MD  aspirin EC 81 MG tablet Take 81 mg by mouth every morning.   Yes Historical Provider, MD  atorvastatin (LIPITOR) 40 MG tablet Take 1 tablet (40 mg total) by mouth daily. 02/26/15  Yes [place] N Rumley, DO  cinacalcet (SENSIPAR) 30 MG tablet Take 30 mg by mouth at bedtime.   Yes Historical Provider, MD  cloNIDine (CATAPRES) 0.2 MG tablet Take 1 tablet (0.2 mg total) by mouth 3 (three) times daily. 11/13/15  Yes Smiley Houseman, MD  diclofenac sodium (VOLTAREN) 1 % GEL Apply topically as needed.   Yes Historical Provider, MD  DULoxetine (CYMBALTA) 30 MG capsule Take 1 capsule (30 mg total) by mouth 2 (two) times daily. 11/16/15  Yes Melvenia Beam, MD  fluticasone (FLONASE) 50 MCG/ACT nasal spray Place 2 sprays into both nostrils daily as needed for allergies or rhinitis.    Yes Historical Provider, MD  ipratropium (ATROVENT HFA) 17 MCG/ACT inhaler Inhale 2 puffs into the lungs every 4 (four) hours as needed for wheezing. 06/05/15  Yes Ashly Windell Moulding, DO  lidocaine-prilocaine (EMLA) cream Apply 1 application topically 3 (three) times a week. Use before dialysis on MWF   Yes Historical Provider, MD  loratadine (CLARITIN) 10 MG tablet Take 1 tablet (10 mg total) by mouth daily as needed for allergies. 09/27/14  Yes Coral Spikes, DO  losartan (COZAAR) 100 MG tablet Take 1 tablet (100 mg total) by mouth daily. 11/13/15  Yes Smiley Houseman, MD  metoprolol succinate (TOPROL-XL) 100 MG 24 hr tablet Take 100 mg by mouth at bedtime. Take with or immediately following a meal.   Yes Historical Provider, MD  metoprolol succinate (TOPROL-XL) 50 MG 24 hr tablet Take 50 mg by mouth at bedtime. 10/15/15  Yes Historical Provider, MD  multivitamin (RENA-VIT) TABS tablet Take 1 tablet by mouth at bedtime. 01/01/15   Yes Alyssa A Lincoln Brigham, MD  omeprazole (PRILOSEC) 20 MG capsule Take 20 mg by mouth 2 (two) times daily.   Yes Historical Provider, MD  predniSONE (DELTASONE) 50 MG tablet Take 1 tablet (50 mg total) by mouth daily with breakfast. 12/05/15  Yes Elberta Leatherwood, MD  pregabalin (LYRICA) 75 MG capsule Take 75 mg by mouth every evening.    Yes Historical Provider, MD  RENVELA 800 MG tablet  12/09/15  Yes Historical Provider, MD  senna (SENOKOT) 8.6 MG tablet Take 2 tablets by mouth daily as needed. For constipation   Yes Historical Provider, MD  sulindac (CLINORIL) 150 MG tablet  12/05/15  Yes Historical Provider, MD  traMADol (ULTRAM) 50 MG tablet Take by mouth every 6 (six) hours as needed.   Yes Historical Provider, MD  ondansetron (ZOFRAN) 4 MG tablet Take 1 tablet (4 mg total) by mouth every 8 (eight) hours as needed for nausea or vomiting. Patient not taking: Reported on 12/13/2015 11/02/15   Asiyah Cletis Media, MD    ROS:  Out of a complete 14 system review of symptoms, the patient complains only of the following symptoms, and all other reviewed systems are negative.  Incontinence of the bowels Leg pain  Blood pressure 153/68, pulse 65, height 5\' 1"  (1.549 m), weight 181 lb 8 oz (82.328 kg).  Physical Exam  General: The patient is alert and cooperative at the time of the examination.  Skin: No significant peripheral edema is noted.   Neurologic Exam  Mental status: The patient is alert and oriented x 3 at the time of the examination. The patient has apparent normal recent and remote memory, with an apparently normal attention span and concentration ability.   Cranial nerves: Facial symmetry is present. Speech is normal, no aphasia or dysarthria is noted. Extraocular movements are full. Visual fields are full.  Motor: The patient has good strength in all 4 extremities.  Sensory examination: Soft touch sensation is symmetric on the face, arms, and legs. The patient reports tingling sensations  with light touch on the hands and the legs bilaterally.  Coordination: The patient has good finger-nose-finger  and heel-to-shin bilaterally.  Gait and station: The patient has a slightly wide-based gait. Tandem gait was not tested. Romberg is negative. No drift is seen.  Reflexes: Deep tendon reflexes are symmetric, but are depressed.    MRI cervical 09/01/15:  IMPRESSION: Abnormal MRI scan of the cervical spine showing prominent spondylitic changes throughout most noticeable at C4-5 and C5-6 where there is broad-based disc bulge displacing the cord posteriorly with mild canal and bilateral foraminal narrowing.   Assessment/Plan:  1. Peripheral neuropathy  2. Chronic low back pain, leg discomfort  3. Gait disturbance  The patient will continue with physical therapy. The patient gained benefit with prednisone with her back and leg pain, I will get her set up for an epidural steroid injection of the low back. The patient will go up on the Cymbalta taking 30 mg twice daily. She will follow-up through this office in 6 months.  Jill Alexanders MD 12/13/2015 8:14 PM  [facility] [address] [place], [place] [address]  Phone [phone] Fax [phone]

## 2015-12-14 ENCOUNTER — Other Ambulatory Visit: Payer: Self-pay | Admitting: Neurology

## 2015-12-14 DIAGNOSIS — N186 End stage renal disease: Secondary | ICD-10-CM | POA: Diagnosis not present

## 2015-12-14 DIAGNOSIS — N2581 Secondary hyperparathyroidism of renal origin: Secondary | ICD-10-CM | POA: Diagnosis not present

## 2015-12-14 DIAGNOSIS — M545 Low back pain: Principal | ICD-10-CM

## 2015-12-14 DIAGNOSIS — G8929 Other chronic pain: Secondary | ICD-10-CM

## 2015-12-17 DIAGNOSIS — N186 End stage renal disease: Secondary | ICD-10-CM | POA: Diagnosis not present

## 2015-12-17 DIAGNOSIS — N2581 Secondary hyperparathyroidism of renal origin: Secondary | ICD-10-CM | POA: Diagnosis not present

## 2015-12-18 ENCOUNTER — Ambulatory Visit
Admission: RE | Admit: 2015-12-18 | Discharge: 2015-12-18 | Disposition: A | Discharge status: Home / Self Care | Payer: Medicare Other | Source: Ambulatory Visit | Attending: Neurology | Admitting: Neurology

## 2015-12-18 DIAGNOSIS — G8929 Other chronic pain: Secondary | ICD-10-CM

## 2015-12-18 DIAGNOSIS — M4806 Spinal stenosis, lumbar region: Secondary | ICD-10-CM | POA: Diagnosis not present

## 2015-12-18 DIAGNOSIS — M545 Low back pain: Principal | ICD-10-CM

## 2015-12-18 MED ORDER — IOPAMIDOL (ISOVUE-M 200) INJECTION 41%
1.0000 mL | Freq: Once | INTRAMUSCULAR | Status: AC
  Administered 2015-12-18: 1 mL via EPIDURAL

## 2015-12-18 MED ORDER — METHYLPREDNISOLONE ACETATE 40 MG/ML INJ SUSP (RADIOLOG
120.0000 mg | Freq: Once | INTRAMUSCULAR | Status: AC
  Administered 2015-12-18: 120 mg via EPIDURAL

## 2015-12-18 NOTE — Discharge Instructions (Signed)

## 2015-12-19 DIAGNOSIS — N2581 Secondary hyperparathyroidism of renal origin: Secondary | ICD-10-CM | POA: Diagnosis not present

## 2015-12-19 DIAGNOSIS — N186 End stage renal disease: Secondary | ICD-10-CM | POA: Diagnosis not present

## 2015-12-20 ENCOUNTER — Ambulatory Visit: Payer: Medicare Other

## 2015-12-21 DIAGNOSIS — N186 End stage renal disease: Secondary | ICD-10-CM | POA: Diagnosis not present

## 2015-12-21 DIAGNOSIS — N2581 Secondary hyperparathyroidism of renal origin: Secondary | ICD-10-CM | POA: Diagnosis not present

## 2015-12-24 DIAGNOSIS — N186 End stage renal disease: Secondary | ICD-10-CM | POA: Diagnosis not present

## 2015-12-24 DIAGNOSIS — N2581 Secondary hyperparathyroidism of renal origin: Secondary | ICD-10-CM | POA: Diagnosis not present

## 2015-12-26 DIAGNOSIS — N2581 Secondary hyperparathyroidism of renal origin: Secondary | ICD-10-CM | POA: Diagnosis not present

## 2015-12-26 DIAGNOSIS — N186 End stage renal disease: Secondary | ICD-10-CM | POA: Diagnosis not present

## 2015-12-27 ENCOUNTER — Ambulatory Visit (INDEPENDENT_AMBULATORY_CARE_PROVIDER_SITE_OTHER): Payer: Medicare Other | Admitting: Gastroenterology

## 2015-12-27 ENCOUNTER — Encounter: Payer: Self-pay | Admitting: Gastroenterology

## 2015-12-27 VITALS — BP 148/56 | HR 64 | Ht 61.0 in | Wt 180.8 lb

## 2015-12-27 DIAGNOSIS — R142 Eructation: Secondary | ICD-10-CM | POA: Diagnosis not present

## 2015-12-27 DIAGNOSIS — K219 Gastro-esophageal reflux disease without esophagitis: Secondary | ICD-10-CM

## 2015-12-27 DIAGNOSIS — R159 Full incontinence of feces: Secondary | ICD-10-CM

## 2015-12-27 MED ORDER — OMEPRAZOLE 40 MG PO CPDR: 40.0000 mg | DELAYED_RELEASE_CAPSULE | Freq: Two times a day (BID) | ORAL | 11 refills | Status: DC

## 2015-12-27 NOTE — Patient Instructions (Signed)
Increase your omeprazole to 40 mg twice daily. We sent a new prescription to your pharmacy.   You can use over the counter Gas-x or [place]avison four times a day as needed for gas and bloating.   You have been given a Gas prevention diet.   Patient advised to avoid spicy, acidic, citrus, chocolate, mints, fruit and fruit juices.  Limit the intake of caffeine, alcohol and Soda.  Don't exercise too soon after eating.  Don't lie down within 3-4 hours of eating.  Elevate the head of your bed.  Normal BMI (Body Mass Index- based on height and weight) is between 23 and 30. Your BMI today is Body mass index is 34.16 kg/m. Marland Kitchen Please consider follow up  regarding your BMI with your Primary Care Provider.  Thank you for choosing me and [facility].  Pricilla Riffle. Dagoberto Ligas., MD., Marval Regal

## 2015-12-27 NOTE — Progress Notes (Signed)
    History of Present Illness: This is a 75 year old female returning for GERD and fecal incontinence. Anorectal manometry performed in 07/2015 showing a weak internal and external sphincter. Patient was referred to Dr. Marcello Moores and underwent evaluation that included a rectal ultrasound showing an intact sphincters. EGD in 2009 by Dr. Oletta Lamas showed a large [place]. She has frequent belching and regurg for a few months despite omeprazole 20 mg po bid. Intermittent fecal incontinence with liquid or loose stool persists.  Current Medications, Allergies, Past Medical History, Past Surgical History, Family History and Social History were reviewed in [employer] record.  Physical Exam: General: Well developed, well nourished, no acute distress Head: Normocephalic and atraumatic Eyes:  sclerae anicteric, EOMI Ears: Normal auditory acuity Mouth: No deformity or lesions Lungs: Clear throughout to auscultation Heart: Regular rate and rhythm; no murmurs, rubs or bruits Abdomen: Soft, non tender and non distended. No masses, hepatosplenomegaly or hernias noted. Normal Bowel sounds Musculoskeletal: Symmetrical with no gross deformities  Pulses:  Normal pulses noted Extremities: No clubbing, cyanosis, edema or deformities noted Neurological: Alert oriented x 4, grossly nonfocal Psychological:  Alert and cooperative. Normal mood and affect  Assessment and Recommendations:  1. GERD. Increase omeprazole to 40 mg po bid. Gas-X qid or Gaviscon qid. Pt asked to contact us if symptoms not improved in 6 weeks. Consider EGD.   2. Fecal incontinence. Pt might be a candidate for Interstim. Follow up with Dr. Marcello Moores.

## 2015-12-28 DIAGNOSIS — N2581 Secondary hyperparathyroidism of renal origin: Secondary | ICD-10-CM | POA: Diagnosis not present

## 2015-12-28 DIAGNOSIS — N186 End stage renal disease: Secondary | ICD-10-CM | POA: Diagnosis not present

## 2015-12-31 DIAGNOSIS — E1129 Type 2 diabetes mellitus with other diabetic kidney complication: Secondary | ICD-10-CM | POA: Diagnosis not present

## 2015-12-31 DIAGNOSIS — Z992 Dependence on renal dialysis: Secondary | ICD-10-CM | POA: Diagnosis not present

## 2015-12-31 DIAGNOSIS — N186 End stage renal disease: Secondary | ICD-10-CM | POA: Diagnosis not present

## 2016-01-02 DIAGNOSIS — N2581 Secondary hyperparathyroidism of renal origin: Secondary | ICD-10-CM | POA: Diagnosis not present

## 2016-01-02 DIAGNOSIS — D631 Anemia in chronic kidney disease: Secondary | ICD-10-CM | POA: Diagnosis not present

## 2016-01-02 DIAGNOSIS — N186 End stage renal disease: Secondary | ICD-10-CM | POA: Diagnosis not present

## 2016-01-04 DIAGNOSIS — D631 Anemia in chronic kidney disease: Secondary | ICD-10-CM | POA: Diagnosis not present

## 2016-01-04 DIAGNOSIS — N186 End stage renal disease: Secondary | ICD-10-CM | POA: Diagnosis not present

## 2016-01-04 DIAGNOSIS — N2581 Secondary hyperparathyroidism of renal origin: Secondary | ICD-10-CM | POA: Diagnosis not present

## 2016-01-06 ENCOUNTER — Other Ambulatory Visit: Payer: Self-pay | Admitting: Family Medicine

## 2016-01-07 DIAGNOSIS — N2581 Secondary hyperparathyroidism of renal origin: Secondary | ICD-10-CM | POA: Diagnosis not present

## 2016-01-07 DIAGNOSIS — D631 Anemia in chronic kidney disease: Secondary | ICD-10-CM | POA: Diagnosis not present

## 2016-01-07 DIAGNOSIS — N186 End stage renal disease: Secondary | ICD-10-CM | POA: Diagnosis not present

## 2016-01-09 DIAGNOSIS — N2581 Secondary hyperparathyroidism of renal origin: Secondary | ICD-10-CM | POA: Diagnosis not present

## 2016-01-09 DIAGNOSIS — N186 End stage renal disease: Secondary | ICD-10-CM | POA: Diagnosis not present

## 2016-01-09 DIAGNOSIS — D631 Anemia in chronic kidney disease: Secondary | ICD-10-CM | POA: Diagnosis not present

## 2016-01-11 DIAGNOSIS — N2581 Secondary hyperparathyroidism of renal origin: Secondary | ICD-10-CM | POA: Diagnosis not present

## 2016-01-11 DIAGNOSIS — N186 End stage renal disease: Secondary | ICD-10-CM | POA: Diagnosis not present

## 2016-01-11 DIAGNOSIS — D631 Anemia in chronic kidney disease: Secondary | ICD-10-CM | POA: Diagnosis not present

## 2016-01-14 DIAGNOSIS — N2581 Secondary hyperparathyroidism of renal origin: Secondary | ICD-10-CM | POA: Diagnosis not present

## 2016-01-14 DIAGNOSIS — N186 End stage renal disease: Secondary | ICD-10-CM | POA: Diagnosis not present

## 2016-01-14 DIAGNOSIS — D631 Anemia in chronic kidney disease: Secondary | ICD-10-CM | POA: Diagnosis not present

## 2016-01-15 ENCOUNTER — Other Ambulatory Visit: Payer: Self-pay | Admitting: Family Medicine

## 2016-01-15 ENCOUNTER — Telehealth: Payer: Self-pay | Admitting: Neurology

## 2016-01-15 DIAGNOSIS — M47816 Spondylosis without myelopathy or radiculopathy, lumbar region: Secondary | ICD-10-CM

## 2016-01-15 NOTE — Telephone Encounter (Signed)
I would like to suggest, that repeat ESI be approved by Dr. Jannifer Franklin directly. It has been less than one month since the [facility]. Please advise patient that you will call back after Dr. Jannifer Franklin' return for an update on this.

## 2016-01-15 NOTE — Telephone Encounter (Signed)
I called patient. The patient only got about 3 weeks of benefit from the last epidural, we will try another injection to see if this helps.

## 2016-01-15 NOTE — Telephone Encounter (Signed)
Patient called to request steroid injection for legs, please call.

## 2016-01-15 NOTE — Telephone Encounter (Signed)
Pt underwent interlaminar epidural injection at L3-4 on 12/18/15 d/t low back and lower extremity pain.

## 2016-01-16 DIAGNOSIS — N2581 Secondary hyperparathyroidism of renal origin: Secondary | ICD-10-CM | POA: Diagnosis not present

## 2016-01-16 DIAGNOSIS — D631 Anemia in chronic kidney disease: Secondary | ICD-10-CM | POA: Diagnosis not present

## 2016-01-16 DIAGNOSIS — N186 End stage renal disease: Secondary | ICD-10-CM | POA: Diagnosis not present

## 2016-01-17 ENCOUNTER — Other Ambulatory Visit: Payer: Self-pay | Admitting: Neurology

## 2016-01-17 DIAGNOSIS — M47816 Spondylosis without myelopathy or radiculopathy, lumbar region: Secondary | ICD-10-CM

## 2016-01-18 DIAGNOSIS — N186 End stage renal disease: Secondary | ICD-10-CM | POA: Diagnosis not present

## 2016-01-18 DIAGNOSIS — D631 Anemia in chronic kidney disease: Secondary | ICD-10-CM | POA: Diagnosis not present

## 2016-01-18 DIAGNOSIS — N2581 Secondary hyperparathyroidism of renal origin: Secondary | ICD-10-CM | POA: Diagnosis not present

## 2016-01-20 ENCOUNTER — Other Ambulatory Visit: Payer: Self-pay | Admitting: Family Medicine

## 2016-01-21 DIAGNOSIS — N186 End stage renal disease: Secondary | ICD-10-CM | POA: Diagnosis not present

## 2016-01-21 DIAGNOSIS — D631 Anemia in chronic kidney disease: Secondary | ICD-10-CM | POA: Diagnosis not present

## 2016-01-21 DIAGNOSIS — N2581 Secondary hyperparathyroidism of renal origin: Secondary | ICD-10-CM | POA: Diagnosis not present

## 2016-01-23 DIAGNOSIS — N186 End stage renal disease: Secondary | ICD-10-CM | POA: Diagnosis not present

## 2016-01-23 DIAGNOSIS — D631 Anemia in chronic kidney disease: Secondary | ICD-10-CM | POA: Diagnosis not present

## 2016-01-23 DIAGNOSIS — N2581 Secondary hyperparathyroidism of renal origin: Secondary | ICD-10-CM | POA: Diagnosis not present

## 2016-01-25 ENCOUNTER — Other Ambulatory Visit: Payer: Self-pay | Admitting: Family Medicine

## 2016-01-25 DIAGNOSIS — N2581 Secondary hyperparathyroidism of renal origin: Secondary | ICD-10-CM | POA: Diagnosis not present

## 2016-01-25 DIAGNOSIS — D631 Anemia in chronic kidney disease: Secondary | ICD-10-CM | POA: Diagnosis not present

## 2016-01-25 DIAGNOSIS — N186 End stage renal disease: Secondary | ICD-10-CM | POA: Diagnosis not present

## 2016-01-28 DIAGNOSIS — D631 Anemia in chronic kidney disease: Secondary | ICD-10-CM | POA: Diagnosis not present

## 2016-01-28 DIAGNOSIS — N2581 Secondary hyperparathyroidism of renal origin: Secondary | ICD-10-CM | POA: Diagnosis not present

## 2016-01-28 DIAGNOSIS — N186 End stage renal disease: Secondary | ICD-10-CM | POA: Diagnosis not present

## 2016-01-29 ENCOUNTER — Ambulatory Visit
Admission: RE | Admit: 2016-01-29 | Discharge: 2016-01-29 | Disposition: A | Discharge status: Home / Self Care | Payer: Medicare Other | Source: Ambulatory Visit | Attending: Neurology | Admitting: Neurology

## 2016-01-29 DIAGNOSIS — M545 Low back pain: Secondary | ICD-10-CM | POA: Diagnosis not present

## 2016-01-29 DIAGNOSIS — M47816 Spondylosis without myelopathy or radiculopathy, lumbar region: Secondary | ICD-10-CM

## 2016-01-29 MED ORDER — IOPAMIDOL (ISOVUE-M 200) INJECTION 41%
1.0000 mL | Freq: Once | INTRAMUSCULAR | Status: AC
  Administered 2016-01-29: 1 mL via EPIDURAL

## 2016-01-29 MED ORDER — METHYLPREDNISOLONE ACETATE 40 MG/ML INJ SUSP (RADIOLOG
120.0000 mg | Freq: Once | INTRAMUSCULAR | Status: AC
  Administered 2016-01-29: 120 mg via EPIDURAL

## 2016-01-30 DIAGNOSIS — N2581 Secondary hyperparathyroidism of renal origin: Secondary | ICD-10-CM | POA: Diagnosis not present

## 2016-01-30 DIAGNOSIS — N186 End stage renal disease: Secondary | ICD-10-CM | POA: Diagnosis not present

## 2016-01-30 DIAGNOSIS — D631 Anemia in chronic kidney disease: Secondary | ICD-10-CM | POA: Diagnosis not present

## 2016-01-31 ENCOUNTER — Other Ambulatory Visit: Payer: Medicare Other

## 2016-01-31 DIAGNOSIS — N186 End stage renal disease: Secondary | ICD-10-CM | POA: Diagnosis not present

## 2016-01-31 DIAGNOSIS — Z992 Dependence on renal dialysis: Secondary | ICD-10-CM | POA: Diagnosis not present

## 2016-01-31 DIAGNOSIS — E1129 Type 2 diabetes mellitus with other diabetic kidney complication: Secondary | ICD-10-CM | POA: Diagnosis not present

## 2016-02-01 DIAGNOSIS — N2581 Secondary hyperparathyroidism of renal origin: Secondary | ICD-10-CM | POA: Diagnosis not present

## 2016-02-01 DIAGNOSIS — D631 Anemia in chronic kidney disease: Secondary | ICD-10-CM | POA: Diagnosis not present

## 2016-02-01 DIAGNOSIS — Z23 Encounter for immunization: Secondary | ICD-10-CM | POA: Diagnosis not present

## 2016-02-01 DIAGNOSIS — N186 End stage renal disease: Secondary | ICD-10-CM | POA: Diagnosis not present

## 2016-02-03 ENCOUNTER — Other Ambulatory Visit: Payer: Self-pay | Admitting: Family Medicine

## 2016-02-04 DIAGNOSIS — Z23 Encounter for immunization: Secondary | ICD-10-CM | POA: Diagnosis not present

## 2016-02-04 DIAGNOSIS — D631 Anemia in chronic kidney disease: Secondary | ICD-10-CM | POA: Diagnosis not present

## 2016-02-04 DIAGNOSIS — N2581 Secondary hyperparathyroidism of renal origin: Secondary | ICD-10-CM | POA: Diagnosis not present

## 2016-02-04 DIAGNOSIS — N186 End stage renal disease: Secondary | ICD-10-CM | POA: Diagnosis not present

## 2016-02-06 DIAGNOSIS — N2581 Secondary hyperparathyroidism of renal origin: Secondary | ICD-10-CM | POA: Diagnosis not present

## 2016-02-06 DIAGNOSIS — D631 Anemia in chronic kidney disease: Secondary | ICD-10-CM | POA: Diagnosis not present

## 2016-02-06 DIAGNOSIS — N186 End stage renal disease: Secondary | ICD-10-CM | POA: Diagnosis not present

## 2016-02-06 DIAGNOSIS — Z23 Encounter for immunization: Secondary | ICD-10-CM | POA: Diagnosis not present

## 2016-02-08 DIAGNOSIS — N2581 Secondary hyperparathyroidism of renal origin: Secondary | ICD-10-CM | POA: Diagnosis not present

## 2016-02-08 DIAGNOSIS — Z23 Encounter for immunization: Secondary | ICD-10-CM | POA: Diagnosis not present

## 2016-02-08 DIAGNOSIS — N186 End stage renal disease: Secondary | ICD-10-CM | POA: Diagnosis not present

## 2016-02-08 DIAGNOSIS — D631 Anemia in chronic kidney disease: Secondary | ICD-10-CM | POA: Diagnosis not present

## 2016-02-11 DIAGNOSIS — N2581 Secondary hyperparathyroidism of renal origin: Secondary | ICD-10-CM | POA: Diagnosis not present

## 2016-02-11 DIAGNOSIS — N186 End stage renal disease: Secondary | ICD-10-CM | POA: Diagnosis not present

## 2016-02-11 DIAGNOSIS — D631 Anemia in chronic kidney disease: Secondary | ICD-10-CM | POA: Diagnosis not present

## 2016-02-11 DIAGNOSIS — Z23 Encounter for immunization: Secondary | ICD-10-CM | POA: Diagnosis not present

## 2016-02-13 DIAGNOSIS — N186 End stage renal disease: Secondary | ICD-10-CM | POA: Diagnosis not present

## 2016-02-13 DIAGNOSIS — D631 Anemia in chronic kidney disease: Secondary | ICD-10-CM | POA: Diagnosis not present

## 2016-02-13 DIAGNOSIS — N2581 Secondary hyperparathyroidism of renal origin: Secondary | ICD-10-CM | POA: Diagnosis not present

## 2016-02-13 DIAGNOSIS — Z23 Encounter for immunization: Secondary | ICD-10-CM | POA: Diagnosis not present

## 2016-02-15 DIAGNOSIS — N2581 Secondary hyperparathyroidism of renal origin: Secondary | ICD-10-CM | POA: Diagnosis not present

## 2016-02-15 DIAGNOSIS — Z23 Encounter for immunization: Secondary | ICD-10-CM | POA: Diagnosis not present

## 2016-02-15 DIAGNOSIS — D631 Anemia in chronic kidney disease: Secondary | ICD-10-CM | POA: Diagnosis not present

## 2016-02-15 DIAGNOSIS — N186 End stage renal disease: Secondary | ICD-10-CM | POA: Diagnosis not present

## 2016-02-18 DIAGNOSIS — Z23 Encounter for immunization: Secondary | ICD-10-CM | POA: Diagnosis not present

## 2016-02-18 DIAGNOSIS — N2581 Secondary hyperparathyroidism of renal origin: Secondary | ICD-10-CM | POA: Diagnosis not present

## 2016-02-18 DIAGNOSIS — N186 End stage renal disease: Secondary | ICD-10-CM | POA: Diagnosis not present

## 2016-02-18 DIAGNOSIS — D631 Anemia in chronic kidney disease: Secondary | ICD-10-CM | POA: Diagnosis not present

## 2016-02-20 DIAGNOSIS — Z23 Encounter for immunization: Secondary | ICD-10-CM | POA: Diagnosis not present

## 2016-02-20 DIAGNOSIS — D631 Anemia in chronic kidney disease: Secondary | ICD-10-CM | POA: Diagnosis not present

## 2016-02-20 DIAGNOSIS — N2581 Secondary hyperparathyroidism of renal origin: Secondary | ICD-10-CM | POA: Diagnosis not present

## 2016-02-20 DIAGNOSIS — N186 End stage renal disease: Secondary | ICD-10-CM | POA: Diagnosis not present

## 2016-02-22 ENCOUNTER — Telehealth: Payer: Self-pay | Admitting: Family Medicine

## 2016-02-22 DIAGNOSIS — N2581 Secondary hyperparathyroidism of renal origin: Secondary | ICD-10-CM | POA: Diagnosis not present

## 2016-02-22 DIAGNOSIS — D631 Anemia in chronic kidney disease: Secondary | ICD-10-CM | POA: Diagnosis not present

## 2016-02-22 DIAGNOSIS — N186 End stage renal disease: Secondary | ICD-10-CM | POA: Diagnosis not present

## 2016-02-22 DIAGNOSIS — Z23 Encounter for immunization: Secondary | ICD-10-CM | POA: Diagnosis not present

## 2016-02-22 NOTE — Telephone Encounter (Signed)
Pt has been requesting a refill on her Abilify but it keeps being denied. It looks like the pharmacy was sending this to the wrong pcp. Can we get this called in today. jw

## 2016-02-23 ENCOUNTER — Other Ambulatory Visit: Payer: Self-pay | Admitting: Family Medicine

## 2016-02-23 ENCOUNTER — Other Ambulatory Visit: Payer: Self-pay | Admitting: Internal Medicine

## 2016-02-23 MED ORDER — ABILIFY 2 MG PO TABS: 1.0000 mg | ORAL_TABLET | Freq: Every day | ORAL | 0 refills | Status: DC

## 2016-02-23 NOTE — Progress Notes (Signed)
[name] After Hours Telephone Line   Number received via page: [phone] Person calling: Patient's daughter Relationship to patient: Daughter  Reason for call: Medication refill  Patient's daughter calling regarding Abilify. Apparently patient's pharmacy has been sending prescription refill request to Dr. Thersa Salt (patient's prior PCP) at his new office, rather than sending to [facility], and patient has been without medication for over a month. Medication request finally sent to [facility] yesterday afternoon. Patient's daughter calling today because patient has not had refill called in yet. Informed patient's daughter that after hours line is reserved for emergencies, rather than medication refills, and that since refill request was only received yesterday afternoon, patient's PCP technically has three business days to respond to refill request. Provided a 30d supply of patient's Abilify, but informed patient's daughter that she would need to follow up with patient's PCP for further refills, and that emergency line is not to be used for refills. Patient's daughter kindly voiced understanding.   Adin Hector, MD, MPH PGY-2 [facility] Pager [phone]

## 2016-02-25 ENCOUNTER — Other Ambulatory Visit: Payer: Self-pay | Admitting: Internal Medicine

## 2016-02-25 DIAGNOSIS — Z23 Encounter for immunization: Secondary | ICD-10-CM | POA: Diagnosis not present

## 2016-02-25 DIAGNOSIS — D631 Anemia in chronic kidney disease: Secondary | ICD-10-CM | POA: Diagnosis not present

## 2016-02-25 DIAGNOSIS — N186 End stage renal disease: Secondary | ICD-10-CM | POA: Diagnosis not present

## 2016-02-25 DIAGNOSIS — N2581 Secondary hyperparathyroidism of renal origin: Secondary | ICD-10-CM | POA: Diagnosis not present

## 2016-02-25 MED ORDER — ARIPIPRAZOLE 2 MG PO TABS: 1.0000 mg | ORAL_TABLET | Freq: Every day | ORAL | 0 refills | Status: DC

## 2016-02-26 ENCOUNTER — Telehealth: Payer: Self-pay | Admitting: *Deleted

## 2016-02-26 NOTE — Telephone Encounter (Signed)
Prior Authorization received from [employer] for Abilify 2 mg.  PA form placed in provider box for completion. Derl Barrow, RN

## 2016-02-27 DIAGNOSIS — D631 Anemia in chronic kidney disease: Secondary | ICD-10-CM | POA: Diagnosis not present

## 2016-02-27 DIAGNOSIS — N2581 Secondary hyperparathyroidism of renal origin: Secondary | ICD-10-CM | POA: Diagnosis not present

## 2016-02-27 DIAGNOSIS — Z23 Encounter for immunization: Secondary | ICD-10-CM | POA: Diagnosis not present

## 2016-02-27 DIAGNOSIS — N186 End stage renal disease: Secondary | ICD-10-CM | POA: Diagnosis not present

## 2016-02-27 MED ORDER — ARIPIPRAZOLE 2 MG PO TABS: 1.0000 mg | ORAL_TABLET | Freq: Every day | ORAL | 6 refills | Status: DC

## 2016-02-27 NOTE — Telephone Encounter (Signed)
Done

## 2016-02-29 DIAGNOSIS — N2581 Secondary hyperparathyroidism of renal origin: Secondary | ICD-10-CM | POA: Diagnosis not present

## 2016-02-29 DIAGNOSIS — N186 End stage renal disease: Secondary | ICD-10-CM | POA: Diagnosis not present

## 2016-02-29 DIAGNOSIS — D631 Anemia in chronic kidney disease: Secondary | ICD-10-CM | POA: Diagnosis not present

## 2016-02-29 DIAGNOSIS — Z23 Encounter for immunization: Secondary | ICD-10-CM | POA: Diagnosis not present

## 2016-03-01 DIAGNOSIS — Z992 Dependence on renal dialysis: Secondary | ICD-10-CM | POA: Diagnosis not present

## 2016-03-01 DIAGNOSIS — E1129 Type 2 diabetes mellitus with other diabetic kidney complication: Secondary | ICD-10-CM | POA: Diagnosis not present

## 2016-03-01 DIAGNOSIS — N186 End stage renal disease: Secondary | ICD-10-CM | POA: Diagnosis not present

## 2016-03-03 DIAGNOSIS — N186 End stage renal disease: Secondary | ICD-10-CM | POA: Diagnosis not present

## 2016-03-03 DIAGNOSIS — N2581 Secondary hyperparathyroidism of renal origin: Secondary | ICD-10-CM | POA: Diagnosis not present

## 2016-03-03 DIAGNOSIS — D631 Anemia in chronic kidney disease: Secondary | ICD-10-CM | POA: Diagnosis not present

## 2016-03-04 NOTE — Telephone Encounter (Signed)
PA for Abilify 2 mg approved via [employer]. Coverage from 12/05/15-03/04/2017.  Derl Barrow, RN

## 2016-03-05 DIAGNOSIS — D631 Anemia in chronic kidney disease: Secondary | ICD-10-CM | POA: Diagnosis not present

## 2016-03-05 DIAGNOSIS — N2581 Secondary hyperparathyroidism of renal origin: Secondary | ICD-10-CM | POA: Diagnosis not present

## 2016-03-05 DIAGNOSIS — N186 End stage renal disease: Secondary | ICD-10-CM | POA: Diagnosis not present

## 2016-03-07 DIAGNOSIS — N186 End stage renal disease: Secondary | ICD-10-CM | POA: Diagnosis not present

## 2016-03-07 DIAGNOSIS — N2581 Secondary hyperparathyroidism of renal origin: Secondary | ICD-10-CM | POA: Diagnosis not present

## 2016-03-07 DIAGNOSIS — D631 Anemia in chronic kidney disease: Secondary | ICD-10-CM | POA: Diagnosis not present

## 2016-03-10 DIAGNOSIS — N186 End stage renal disease: Secondary | ICD-10-CM | POA: Diagnosis not present

## 2016-03-10 DIAGNOSIS — D631 Anemia in chronic kidney disease: Secondary | ICD-10-CM | POA: Diagnosis not present

## 2016-03-10 DIAGNOSIS — N2581 Secondary hyperparathyroidism of renal origin: Secondary | ICD-10-CM | POA: Diagnosis not present

## 2016-03-12 DIAGNOSIS — D631 Anemia in chronic kidney disease: Secondary | ICD-10-CM | POA: Diagnosis not present

## 2016-03-12 DIAGNOSIS — N186 End stage renal disease: Secondary | ICD-10-CM | POA: Diagnosis not present

## 2016-03-12 DIAGNOSIS — N2581 Secondary hyperparathyroidism of renal origin: Secondary | ICD-10-CM | POA: Diagnosis not present

## 2016-03-14 DIAGNOSIS — N186 End stage renal disease: Secondary | ICD-10-CM | POA: Diagnosis not present

## 2016-03-14 DIAGNOSIS — N2581 Secondary hyperparathyroidism of renal origin: Secondary | ICD-10-CM | POA: Diagnosis not present

## 2016-03-14 DIAGNOSIS — D631 Anemia in chronic kidney disease: Secondary | ICD-10-CM | POA: Diagnosis not present

## 2016-03-17 DIAGNOSIS — N2581 Secondary hyperparathyroidism of renal origin: Secondary | ICD-10-CM | POA: Diagnosis not present

## 2016-03-17 DIAGNOSIS — D631 Anemia in chronic kidney disease: Secondary | ICD-10-CM | POA: Diagnosis not present

## 2016-03-17 DIAGNOSIS — N186 End stage renal disease: Secondary | ICD-10-CM | POA: Diagnosis not present

## 2016-03-19 DIAGNOSIS — N2581 Secondary hyperparathyroidism of renal origin: Secondary | ICD-10-CM | POA: Diagnosis not present

## 2016-03-19 DIAGNOSIS — N186 End stage renal disease: Secondary | ICD-10-CM | POA: Diagnosis not present

## 2016-03-19 DIAGNOSIS — D631 Anemia in chronic kidney disease: Secondary | ICD-10-CM | POA: Diagnosis not present

## 2016-03-21 DIAGNOSIS — N2581 Secondary hyperparathyroidism of renal origin: Secondary | ICD-10-CM | POA: Diagnosis not present

## 2016-03-21 DIAGNOSIS — N186 End stage renal disease: Secondary | ICD-10-CM | POA: Diagnosis not present

## 2016-03-21 DIAGNOSIS — D631 Anemia in chronic kidney disease: Secondary | ICD-10-CM | POA: Diagnosis not present

## 2016-03-24 DIAGNOSIS — D631 Anemia in chronic kidney disease: Secondary | ICD-10-CM | POA: Diagnosis not present

## 2016-03-24 DIAGNOSIS — N186 End stage renal disease: Secondary | ICD-10-CM | POA: Diagnosis not present

## 2016-03-24 DIAGNOSIS — N2581 Secondary hyperparathyroidism of renal origin: Secondary | ICD-10-CM | POA: Diagnosis not present

## 2016-03-26 DIAGNOSIS — N186 End stage renal disease: Secondary | ICD-10-CM | POA: Diagnosis not present

## 2016-03-26 DIAGNOSIS — N2581 Secondary hyperparathyroidism of renal origin: Secondary | ICD-10-CM | POA: Diagnosis not present

## 2016-03-26 DIAGNOSIS — D631 Anemia in chronic kidney disease: Secondary | ICD-10-CM | POA: Diagnosis not present

## 2016-03-28 DIAGNOSIS — D631 Anemia in chronic kidney disease: Secondary | ICD-10-CM | POA: Diagnosis not present

## 2016-03-28 DIAGNOSIS — N186 End stage renal disease: Secondary | ICD-10-CM | POA: Diagnosis not present

## 2016-03-28 DIAGNOSIS — N2581 Secondary hyperparathyroidism of renal origin: Secondary | ICD-10-CM | POA: Diagnosis not present

## 2016-03-31 DIAGNOSIS — N2581 Secondary hyperparathyroidism of renal origin: Secondary | ICD-10-CM | POA: Diagnosis not present

## 2016-03-31 DIAGNOSIS — N186 End stage renal disease: Secondary | ICD-10-CM | POA: Diagnosis not present

## 2016-03-31 DIAGNOSIS — D631 Anemia in chronic kidney disease: Secondary | ICD-10-CM | POA: Diagnosis not present

## 2016-04-01 DIAGNOSIS — I871 Compression of vein: Secondary | ICD-10-CM | POA: Diagnosis not present

## 2016-04-01 DIAGNOSIS — T82858D Stenosis of vascular prosthetic devices, implants and grafts, subsequent encounter: Secondary | ICD-10-CM | POA: Diagnosis not present

## 2016-04-01 DIAGNOSIS — E1129 Type 2 diabetes mellitus with other diabetic kidney complication: Secondary | ICD-10-CM | POA: Diagnosis not present

## 2016-04-01 DIAGNOSIS — N186 End stage renal disease: Secondary | ICD-10-CM | POA: Diagnosis not present

## 2016-04-01 DIAGNOSIS — Z992 Dependence on renal dialysis: Secondary | ICD-10-CM | POA: Diagnosis not present

## 2016-04-01 DIAGNOSIS — N182 Chronic kidney disease, stage 2 (mild): Secondary | ICD-10-CM | POA: Diagnosis not present

## 2016-04-02 DIAGNOSIS — N2581 Secondary hyperparathyroidism of renal origin: Secondary | ICD-10-CM | POA: Diagnosis not present

## 2016-04-02 DIAGNOSIS — N186 End stage renal disease: Secondary | ICD-10-CM | POA: Diagnosis not present

## 2016-04-02 DIAGNOSIS — D509 Iron deficiency anemia, unspecified: Secondary | ICD-10-CM | POA: Diagnosis not present

## 2016-04-02 DIAGNOSIS — D631 Anemia in chronic kidney disease: Secondary | ICD-10-CM | POA: Diagnosis not present

## 2016-04-04 DIAGNOSIS — D509 Iron deficiency anemia, unspecified: Secondary | ICD-10-CM | POA: Diagnosis not present

## 2016-04-04 DIAGNOSIS — N186 End stage renal disease: Secondary | ICD-10-CM | POA: Diagnosis not present

## 2016-04-04 DIAGNOSIS — N2581 Secondary hyperparathyroidism of renal origin: Secondary | ICD-10-CM | POA: Diagnosis not present

## 2016-04-04 DIAGNOSIS — D631 Anemia in chronic kidney disease: Secondary | ICD-10-CM | POA: Diagnosis not present

## 2016-04-07 DIAGNOSIS — D509 Iron deficiency anemia, unspecified: Secondary | ICD-10-CM | POA: Diagnosis not present

## 2016-04-07 DIAGNOSIS — N2581 Secondary hyperparathyroidism of renal origin: Secondary | ICD-10-CM | POA: Diagnosis not present

## 2016-04-07 DIAGNOSIS — N186 End stage renal disease: Secondary | ICD-10-CM | POA: Diagnosis not present

## 2016-04-07 DIAGNOSIS — D631 Anemia in chronic kidney disease: Secondary | ICD-10-CM | POA: Diagnosis not present

## 2016-04-09 DIAGNOSIS — N2581 Secondary hyperparathyroidism of renal origin: Secondary | ICD-10-CM | POA: Diagnosis not present

## 2016-04-09 DIAGNOSIS — N186 End stage renal disease: Secondary | ICD-10-CM | POA: Diagnosis not present

## 2016-04-09 DIAGNOSIS — D509 Iron deficiency anemia, unspecified: Secondary | ICD-10-CM | POA: Diagnosis not present

## 2016-04-09 DIAGNOSIS — D631 Anemia in chronic kidney disease: Secondary | ICD-10-CM | POA: Diagnosis not present

## 2016-04-11 DIAGNOSIS — N186 End stage renal disease: Secondary | ICD-10-CM | POA: Diagnosis not present

## 2016-04-11 DIAGNOSIS — D631 Anemia in chronic kidney disease: Secondary | ICD-10-CM | POA: Diagnosis not present

## 2016-04-11 DIAGNOSIS — N2581 Secondary hyperparathyroidism of renal origin: Secondary | ICD-10-CM | POA: Diagnosis not present

## 2016-04-11 DIAGNOSIS — D509 Iron deficiency anemia, unspecified: Secondary | ICD-10-CM | POA: Diagnosis not present

## 2016-04-14 DIAGNOSIS — N2581 Secondary hyperparathyroidism of renal origin: Secondary | ICD-10-CM | POA: Diagnosis not present

## 2016-04-14 DIAGNOSIS — D509 Iron deficiency anemia, unspecified: Secondary | ICD-10-CM | POA: Diagnosis not present

## 2016-04-14 DIAGNOSIS — N186 End stage renal disease: Secondary | ICD-10-CM | POA: Diagnosis not present

## 2016-04-14 DIAGNOSIS — D631 Anemia in chronic kidney disease: Secondary | ICD-10-CM | POA: Diagnosis not present

## 2016-04-16 DIAGNOSIS — N186 End stage renal disease: Secondary | ICD-10-CM | POA: Diagnosis not present

## 2016-04-16 DIAGNOSIS — D509 Iron deficiency anemia, unspecified: Secondary | ICD-10-CM | POA: Diagnosis not present

## 2016-04-16 DIAGNOSIS — D631 Anemia in chronic kidney disease: Secondary | ICD-10-CM | POA: Diagnosis not present

## 2016-04-16 DIAGNOSIS — N2581 Secondary hyperparathyroidism of renal origin: Secondary | ICD-10-CM | POA: Diagnosis not present

## 2016-04-18 DIAGNOSIS — N186 End stage renal disease: Secondary | ICD-10-CM | POA: Diagnosis not present

## 2016-04-18 DIAGNOSIS — D631 Anemia in chronic kidney disease: Secondary | ICD-10-CM | POA: Diagnosis not present

## 2016-04-18 DIAGNOSIS — N2581 Secondary hyperparathyroidism of renal origin: Secondary | ICD-10-CM | POA: Diagnosis not present

## 2016-04-18 DIAGNOSIS — D509 Iron deficiency anemia, unspecified: Secondary | ICD-10-CM | POA: Diagnosis not present

## 2016-04-19 ENCOUNTER — Observation Stay (HOSPITAL_COMMUNITY): Payer: Medicare Other

## 2016-04-19 ENCOUNTER — Observation Stay (HOSPITAL_COMMUNITY)
Admission: EM | Admit: 2016-04-19 | Discharge: 2016-04-20 | Disposition: A | Discharge status: Home / Self Care | Payer: Medicare Other | Attending: Family Medicine | Admitting: Family Medicine

## 2016-04-19 ENCOUNTER — Encounter (HOSPITAL_COMMUNITY): Payer: Self-pay

## 2016-04-19 ENCOUNTER — Emergency Department (HOSPITAL_COMMUNITY): Payer: Medicare Other

## 2016-04-19 DIAGNOSIS — Z8249 Family history of ischemic heart disease and other diseases of the circulatory system: Secondary | ICD-10-CM | POA: Diagnosis not present

## 2016-04-19 DIAGNOSIS — N186 End stage renal disease: Secondary | ICD-10-CM | POA: Diagnosis not present

## 2016-04-19 DIAGNOSIS — D631 Anemia in chronic kidney disease: Secondary | ICD-10-CM | POA: Insufficient documentation

## 2016-04-19 DIAGNOSIS — J449 Chronic obstructive pulmonary disease, unspecified: Secondary | ICD-10-CM | POA: Insufficient documentation

## 2016-04-19 DIAGNOSIS — R110 Nausea: Secondary | ICD-10-CM | POA: Insufficient documentation

## 2016-04-19 DIAGNOSIS — Z7982 Long term (current) use of aspirin: Secondary | ICD-10-CM | POA: Diagnosis not present

## 2016-04-19 DIAGNOSIS — K219 Gastro-esophageal reflux disease without esophagitis: Secondary | ICD-10-CM | POA: Diagnosis not present

## 2016-04-19 DIAGNOSIS — R072 Precordial pain: Secondary | ICD-10-CM | POA: Diagnosis not present

## 2016-04-19 DIAGNOSIS — Z992 Dependence on renal dialysis: Secondary | ICD-10-CM | POA: Insufficient documentation

## 2016-04-19 DIAGNOSIS — I132 Hypertensive heart and chronic kidney disease with heart failure and with stage 5 chronic kidney disease, or end stage renal disease: Secondary | ICD-10-CM | POA: Insufficient documentation

## 2016-04-19 DIAGNOSIS — F329 Major depressive disorder, single episode, unspecified: Secondary | ICD-10-CM | POA: Insufficient documentation

## 2016-04-19 DIAGNOSIS — I2510 Atherosclerotic heart disease of native coronary artery without angina pectoris: Secondary | ICD-10-CM | POA: Diagnosis not present

## 2016-04-19 DIAGNOSIS — F17210 Nicotine dependence, cigarettes, uncomplicated: Secondary | ICD-10-CM | POA: Diagnosis not present

## 2016-04-19 DIAGNOSIS — G609 Hereditary and idiopathic neuropathy, unspecified: Secondary | ICD-10-CM | POA: Diagnosis not present

## 2016-04-19 DIAGNOSIS — E785 Hyperlipidemia, unspecified: Secondary | ICD-10-CM | POA: Insufficient documentation

## 2016-04-19 DIAGNOSIS — I5032 Chronic diastolic (congestive) heart failure: Secondary | ICD-10-CM | POA: Insufficient documentation

## 2016-04-19 DIAGNOSIS — Z79899 Other long term (current) drug therapy: Secondary | ICD-10-CM | POA: Diagnosis not present

## 2016-04-19 DIAGNOSIS — N2581 Secondary hyperparathyroidism of renal origin: Secondary | ICD-10-CM | POA: Insufficient documentation

## 2016-04-19 DIAGNOSIS — M1A9XX0 Chronic gout, unspecified, without tophus (tophi): Secondary | ICD-10-CM | POA: Diagnosis not present

## 2016-04-19 DIAGNOSIS — I249 Acute ischemic heart disease, unspecified: Secondary | ICD-10-CM | POA: Diagnosis present

## 2016-04-19 DIAGNOSIS — Z8673 Personal history of transient ischemic attack (TIA), and cerebral infarction without residual deficits: Secondary | ICD-10-CM | POA: Insufficient documentation

## 2016-04-19 DIAGNOSIS — R079 Chest pain, unspecified: Secondary | ICD-10-CM | POA: Diagnosis present

## 2016-04-19 DIAGNOSIS — I700 Atherosclerosis of aorta: Secondary | ICD-10-CM | POA: Diagnosis not present

## 2016-04-19 DIAGNOSIS — R52 Pain, unspecified: Secondary | ICD-10-CM

## 2016-04-19 DIAGNOSIS — M159 Polyosteoarthritis, unspecified: Secondary | ICD-10-CM | POA: Insufficient documentation

## 2016-04-19 LAB — CBC WITH DIFFERENTIAL/PLATELET
BASOS PCT: 1 %
Basophils Absolute: 0 10*3/uL (ref 0.0–0.1)
EOS ABS: 0.3 10*3/uL (ref 0.0–0.7)
EOS PCT: 4 %
HEMATOCRIT: 33.1 % — AB (ref 36.0–46.0)
Hemoglobin: 10.6 g/dL — ABNORMAL LOW (ref 12.0–15.0)
LYMPHS ABS: 1.2 10*3/uL (ref 0.7–4.0)
Lymphocytes Relative: 15 %
MCH: 28.7 pg (ref 26.0–34.0)
MCHC: 32 g/dL (ref 30.0–36.0)
MCV: 89.7 fL (ref 78.0–100.0)
MONOS PCT: 14 %
Monocytes Absolute: 1.1 10*3/uL — ABNORMAL HIGH (ref 0.1–1.0)
NEUTROS PCT: 66 %
Neutro Abs: 5.5 10*3/uL (ref 1.7–7.7)
Platelets: 228 10*3/uL (ref 150–400)
RBC: 3.69 MIL/uL — ABNORMAL LOW (ref 3.87–5.11)
RDW: 16.9 % — AB (ref 11.5–15.5)
WBC: 8.2 10*3/uL (ref 4.0–10.5)

## 2016-04-19 LAB — LIPASE, BLOOD: Lipase: 33 U/L (ref 11–51)

## 2016-04-19 LAB — RENAL FUNCTION PANEL
ALBUMIN: 2.8 g/dL — AB (ref 3.5–5.0)
Anion gap: 12 (ref 5–15)
BUN: 22 mg/dL — AB (ref 6–20)
CALCIUM: 8.4 mg/dL — AB (ref 8.9–10.3)
CHLORIDE: 96 mmol/L — AB (ref 101–111)
CO2: 26 mmol/L (ref 22–32)
CREATININE: 6.78 mg/dL — AB (ref 0.44–1.00)
GFR, EST AFRICAN AMERICAN: 6 mL/min — AB (ref >60)
GFR, EST NON AFRICAN AMERICAN: 5 mL/min — AB (ref >60)
Glucose, Bld: 82 mg/dL (ref 65–99)
Phosphorus: 6.8 mg/dL — ABNORMAL HIGH (ref 2.5–4.6)
Potassium: 4 mmol/L (ref 3.5–5.1)
SODIUM: 134 mmol/L — AB (ref 135–145)

## 2016-04-19 LAB — COMPREHENSIVE METABOLIC PANEL
ALK PHOS: 86 U/L (ref 38–126)
ALT: 12 U/L — AB (ref 14–54)
ANION GAP: 12 (ref 5–15)
AST: 19 U/L (ref 15–41)
Albumin: 3 g/dL — ABNORMAL LOW (ref 3.5–5.0)
BILIRUBIN TOTAL: 0.4 mg/dL (ref 0.3–1.2)
BUN: 19 mg/dL (ref 6–20)
CALCIUM: 8.9 mg/dL (ref 8.9–10.3)
CO2: 26 mmol/L (ref 22–32)
CREATININE: 6.44 mg/dL — AB (ref 0.44–1.00)
Chloride: 97 mmol/L — ABNORMAL LOW (ref 101–111)
GFR calc non Af Amer: 6 mL/min — ABNORMAL LOW (ref >60)
GFR, EST AFRICAN AMERICAN: 7 mL/min — AB (ref >60)
Glucose, Bld: 146 mg/dL — ABNORMAL HIGH (ref 65–99)
Potassium: 4.1 mmol/L (ref 3.5–5.1)
Sodium: 135 mmol/L (ref 135–145)
TOTAL PROTEIN: 6.9 g/dL (ref 6.5–8.1)

## 2016-04-19 LAB — I-STAT TROPONIN, ED: Troponin i, poc: 0.01 ng/mL (ref 0.00–0.08)

## 2016-04-19 LAB — TROPONIN I

## 2016-04-19 MED ORDER — ASPIRIN 81 MG PO CHEW
243.0000 mg | CHEWABLE_TABLET | Freq: Once | ORAL | Status: AC
  Administered 2016-04-19: 243 mg via ORAL
  Filled 2016-04-19: qty 3

## 2016-04-19 MED ORDER — DULOXETINE HCL 30 MG PO CPEP
30.0000 mg | ORAL_CAPSULE | Freq: Every day | ORAL | Status: DC
  Administered 2016-04-20: 30 mg via ORAL
  Filled 2016-04-19: qty 1

## 2016-04-19 MED ORDER — ATORVASTATIN CALCIUM 40 MG PO TABS
40.0000 mg | ORAL_TABLET | Freq: Every day | ORAL | Status: DC
  Administered 2016-04-20: 40 mg via ORAL
  Filled 2016-04-19: qty 1

## 2016-04-19 MED ORDER — PANTOPRAZOLE SODIUM 40 MG PO TBEC: 40.0000 mg | DELAYED_RELEASE_TABLET | Freq: Every day | ORAL | Status: DC

## 2016-04-19 MED ORDER — IOPAMIDOL (ISOVUE-370) INJECTION 76%
INTRAVENOUS | Status: AC
  Administered 2016-04-19: 100 mL
  Filled 2016-04-19: qty 100

## 2016-04-19 MED ORDER — METOPROLOL SUCCINATE ER 25 MG PO TB24: 50.0000 mg | ORAL_TABLET | Freq: Every day | ORAL | Status: DC

## 2016-04-19 MED ORDER — ASPIRIN 81 MG PO CHEW: 81.0000 mg | CHEWABLE_TABLET | Freq: Once | ORAL | Status: DC

## 2016-04-19 MED ORDER — PANTOPRAZOLE SODIUM 40 MG PO TBEC
40.0000 mg | DELAYED_RELEASE_TABLET | Freq: Two times a day (BID) | ORAL | Status: DC
  Administered 2016-04-19 – 2016-04-20 (×2): 40 mg via ORAL
  Filled 2016-04-19 (×2): qty 1

## 2016-04-19 MED ORDER — ARIPIPRAZOLE 2 MG PO TABS
1.0000 mg | ORAL_TABLET | Freq: Every day | ORAL | Status: DC
  Administered 2016-04-20: 1 mg via ORAL
  Filled 2016-04-19: qty 1

## 2016-04-19 MED ORDER — PREGABALIN 100 MG PO CAPS
100.0000 mg | ORAL_CAPSULE | Freq: Every evening | ORAL | Status: DC
  Administered 2016-04-19: 100 mg via ORAL
  Filled 2016-04-19: qty 1

## 2016-04-19 MED ORDER — CINACALCET HCL 30 MG PO TABS
90.0000 mg | ORAL_TABLET | Freq: Every day | ORAL | Status: DC
  Administered 2016-04-19: 90 mg via ORAL
  Filled 2016-04-19: qty 3

## 2016-04-19 MED ORDER — GI COCKTAIL ~~LOC~~
30.0000 mL | Freq: Once | ORAL | Status: AC
  Administered 2016-04-19: 30 mL via ORAL
  Filled 2016-04-19: qty 30

## 2016-04-19 MED ORDER — METOPROLOL SUCCINATE ER 50 MG PO TB24
150.0000 mg | ORAL_TABLET | Freq: Every day | ORAL | Status: DC
  Administered 2016-04-19: 150 mg via ORAL
  Filled 2016-04-19: qty 1

## 2016-04-19 MED ORDER — ALBUTEROL SULFATE (2.5 MG/3ML) 0.083% IN NEBU: 3.0000 mL | INHALATION_SOLUTION | RESPIRATORY_TRACT | Status: DC | PRN

## 2016-04-19 MED ORDER — ONDANSETRON HCL 4 MG/2ML IJ SOLN: 4.0000 mg | Freq: Four times a day (QID) | INTRAMUSCULAR | Status: DC | PRN

## 2016-04-19 MED ORDER — ENOXAPARIN SODIUM 30 MG/0.3ML ~~LOC~~ SOLN
30.0000 mg | SUBCUTANEOUS | Status: DC
  Administered 2016-04-19: 30 mg via SUBCUTANEOUS
  Filled 2016-04-19: qty 0.3

## 2016-04-19 MED ORDER — CLONIDINE HCL 0.2 MG PO TABS
0.2000 mg | ORAL_TABLET | Freq: Three times a day (TID) | ORAL | Status: DC
  Administered 2016-04-19 – 2016-04-20 (×2): 0.2 mg via ORAL
  Filled 2016-04-19 (×2): qty 1

## 2016-04-19 MED ORDER — ACETAMINOPHEN 325 MG PO TABS
650.0000 mg | ORAL_TABLET | ORAL | Status: DC | PRN
  Administered 2016-04-20: 650 mg via ORAL

## 2016-04-19 NOTE — ED Triage Notes (Signed)
Pt brought in by EMS due to having chest pain while eating oatmeal this morning. Pt describes pain as a tightness. HD pt on MWF. Pt received nitro x1 and 81mg  of aspirin. Pt rates pain 3 out of 10 at this time. Pt a&ox4. Pt does endorse some SOB, weakness, and nausea.

## 2016-04-19 NOTE — ED Provider Notes (Signed)
Emergency Department Provider Note   I have reviewed the triage vital signs and the nursing notes.   HISTORY  Chief Complaint Chest Pain   HPI [name] is a 75 y.o. female with PMH of CHF, COPD, ESRD on HD (MWF), GERD, HTN, HLD, and TIA presents to the emergency department for evaluation of sudden onset central chest pain. Patient states she was eating breakfast and shortly afterwards began having a substernal sharp chest pain that radiated to her back. She felt generally weak. No diaphoresis. No associated nausea, vomiting, diarrhea. No belly pain. No prior history of similar pain. No exacerbating or alleviating factors.  EMS was called and the patient received nitroglycerin on scene which did improve her pain slightly. She was unable to chew the aspirin that they offered her.   Past Medical History:  Diagnosis Date  . Abnormality of gait 03/28/2015  . Adrenal mass ([place])   . 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 Arthritis   . Back pain   . CHF (congestive heart failure) ([place])   . Chronic kidney disease    [place] MWF  . Constipation   . COPD (chronic obstructive pulmonary disease) ([place])   . Depression   . Dialysis patient [facility])    kidney  . Diverticulitis   . GERD (gastroesophageal reflux disease)   . H/O hiatal hernia   . High cholesterol   . Hyperlipidemia   . Hypertension   . IBS (irritable bowel syndrome)   . Meralgia paresthetica of right side 12/26/2014  . Normal cardiac stress test 12/24/2009   lexiscan, imaging normal  . Renal disorder   . RLS (restless legs syndrome)   . Seizures ([place])    2004  . Sinus complaint   . Thyroid disease   . TIA (transient ischemic attack)   . Tubular adenoma of colon 01/2008    Patient Active Problem List   Diagnosis Date Noted  . Chest pain 04/19/2016  . ACS (acute coronary syndrome) ([place]) 04/19/2016  . Leg pain, bilateral 12/05/2015  . Trouble in sleeping 11/22/2015  . End  stage renal disease ([place])   . Hyperkalemia 11/12/2015  . Shortness of breath 11/12/2015  . ESRD on dialysis ([place])   . Dyspnea   . Chronic obstructive pulmonary disease ([place])   . Hypertensive urgency 09/20/2015  . Fecal incontinence   . Adverse effects of medication 06/21/2015  . Herpes 06/19/2015  . Skin lesion 06/16/2015  . Genital ulcer, female 06/16/2015  . Onychocryptosis 04/08/2015  . Renal mass   . End stage renal disease on dialysis ([place])   . Inadequate pain control 12/30/2014  . CHF (congestive heart failure) ([place]) 12/30/2014  . Meralgia paresthetica of right side 12/26/2014  . Hereditary and idiopathic peripheral neuropathy 02/15/2013  . Dermatitis of face 08/19/2012  . HIP PAIN, BILATERAL 12/07/2008  . BLADDER PROLAPSE 11/17/2007  . OSTEOARTHRITIS, GENERALIZED, MULTIPLE JOINTS 08/18/2007  . 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 ANXIETY STATE, UNSPECIFIED 04/23/2007  . ANEMIA NEC 03/31/2007  . TOBACCO ABUSE 12/10/2006  . HIATAL HERNIA 12/10/2006  . Dysphagia, unspecified(787.20) 12/10/2006  . HYPERCHOLESTEROLEMIA 07/30/2006  . Gout, unspecified 07/30/2006  . DEPRESSIVE DISORDER, NOS 07/30/2006  . HYPERTENSION, BENIGN SYSTEMIC 07/30/2006  . GASTROESOPHAGEAL REFLUX, NO ESOPHAGITIS 07/30/2006    Past Surgical History:  Procedure Laterality Date  . ABDOMINAL HYSTERECTOMY    . ANAL RECTAL MANOMETRY N/A 07/25/2015   Procedure: ANO RECTAL MANOMETRY;  Surgeon: Mauri Pole, MD;  Location: [facility];  Service: Endoscopy;  Laterality: N/A;  . APPENDECTOMY    . AV FISTULA PLACEMENT Left 11/11/2012   Procedure: INSERTION OF ARTERIOVENOUS (AV) GORE-TEX GRAFT ARM;  Surgeon: Angelia Mould, MD;  Location: [facility];  Service: Vascular;  Laterality: Left;  . [place] REMOVAL Left 11/18/2012   Procedure: REMOVAL OF LEFT UPPER ARM ARTERIOVENOUS GORETEX GRAFT ([place]);  Surgeon: Angelia Mould, MD;  Location: [facility];  Service: Vascular;  Laterality: Left;   . CARDIAC CATHETERIZATION  2003   normal  . CHOLECYSTECTOMY     Open mid line incision  . frontal cranialcity  2002  . frontal craniotomy  2002   indication = sinusitis  . INSERTION OF DIALYSIS CATHETER     Left  . PATCH ANGIOPLASTY Left 11/18/2012   Procedure: PATCH ANGIOPLASTY;  Surgeon: Angelia Mould, MD;  Location: [facility];  Service: Vascular;  Laterality: Left;  . PERIPHERAL VASCULAR CATHETERIZATION Left 03/08/2015   Procedure: A/V Shuntogram/Fistulagram;  Surgeon: Algernon Huxley, MD;  Location: [facility];  Service: Cardiovascular;  Laterality: Left;  . PERIPHERAL VASCULAR CATHETERIZATION Left 03/08/2015   Procedure: A/V Shunt Intervention;  Surgeon: Algernon Huxley, MD;  Location: [facility];  Service: Cardiovascular;  Laterality: Left;  . RECTAL ULTRASOUND N/A 10/05/2015   Procedure: ANAL ULTRASOUND WITH PROBE;  Surgeon: Leighton Ruff, MD;  Location: [facility];  Service: Endoscopy;  Laterality: N/A;  . REVISON OF ARTERIOVENOUS FISTULA  05/11/2012   Procedure: REVISON OF ARTERIOVENOUS FISTULA;  Surgeon: Elam Dutch, MD;  Location: [facility];  Service: Vascular;  Laterality: Right;  . TUBAL LIGATION        Allergies Tuberculin tests; Valacyclovir; Codeine; Penicillins; and Sulfamethoxazole  Family History  Problem Relation Age of Onset  . Thyroid cancer Mother   . Heart disease Father   . Hypertension Father   . Heart attack Father   . Lupus Daughter   . Breast cancer Sister   . Thyroid cancer Sister     Social History Social History  Substance Use Topics  . Smoking status: Current Every Day Smoker    Packs/day: 1.00    Years: 60.00    Types: Cigarettes    Start date: 06/16/2014    Last attempt to quit: 05/31/2015  . Smokeless tobacco: Never Used     Comment: smoker since 75 yo.  1.5 ppd of [place] 100 lights.   . Alcohol use No    Review of Systems  Constitutional: No fever/chills. Positive weakness.  Eyes: No visual changes. ENT: No  sore throat. Cardiovascular: Positive chest pain. Respiratory: Denies shortness of breath. Gastrointestinal: No abdominal pain.  No nausea, no vomiting.  No diarrhea.  No constipation. Genitourinary: Negative for dysuria. Musculoskeletal: Negative for back pain. Skin: Negative for rash. Neurological: Negative for headaches, focal weakness or numbness.  10-point ROS otherwise negative.  ____________________________________________   PHYSICAL EXAM:  VITAL SIGNS: ED Triage Vitals  Enc Vitals Group     BP 04/19/16 1119 (!) 124/49     Pulse Rate 04/19/16 1119 66     Resp 04/19/16 1119 16     Temp 04/19/16 1119 97.8 F (36.6 C)     Temp Source 04/19/16 1119 Oral     SpO2 04/19/16 1111 100 %     Weight 04/19/16 1115 184 lb (83.5 kg)     Height 04/19/16 1115 5\' 2"  (1.575 m)     Pain Score 04/19/16 1115  3   Constitutional: Alert and oriented. Well appearing and in no acute distress. Eyes: Conjunctivae are normal.  Head: Atraumatic. Nose: No congestion/rhinnorhea. Mouth/Throat: Mucous membranes are moist.  Neck: No stridor.   Cardiovascular: Normal rate, regular rhythm. Good peripheral circulation. Grossly normal heart sounds.   Respiratory: Normal respiratory effort.  No retractions. Lungs CTAB. Gastrointestinal: Soft and nontender. No distention.  Musculoskeletal: No lower extremity tenderness nor edema. No gross deformities of extremities. Neurologic:  Normal speech and language. No gross focal neurologic deficits are appreciated.  Skin:  Skin is warm, dry and intact. No rash noted.  ____________________________________________   LABS (all labs ordered are listed, but only abnormal results are displayed)  Labs Reviewed  COMPREHENSIVE METABOLIC PANEL - Abnormal; Notable for the following:       Result Value   Chloride 97 (*)    Glucose, Bld 146 (*)    Creatinine, Ser 6.44 (*)    Albumin 3.0 (*)    ALT 12 (*)    GFR calc non Af Amer 6 (*)    GFR calc Af Amer 7 (*)     All other components within normal limits  CBC WITH DIFFERENTIAL/PLATELET - Abnormal; Notable for the following:    RBC 3.69 (*)    Hemoglobin 10.6 (*)    HCT 33.1 (*)    RDW 16.9 (*)    Monocytes Absolute 1.1 (*)    All other components within normal limits  RENAL FUNCTION PANEL - Abnormal; Notable for the following:    Sodium 134 (*)    Chloride 96 (*)    BUN 22 (*)    Creatinine, Ser 6.78 (*)    Calcium 8.4 (*)    Phosphorus 6.8 (*)    Albumin 2.8 (*)    GFR calc non Af Amer 5 (*)    GFR calc Af Amer 6 (*)    All other components within normal limits  LIPASE, BLOOD  TROPONIN I  TROPONIN I  TROPONIN I  CBC  I-STAT TROPOININ, ED   ____________________________________________  EKG   EKG Interpretation  Date/Time:  Saturday April 19 2016 11:14:57 EST Ventricular Rate:  67 PR Interval:    QRS Duration: 84 QT Interval:  456 QTC Calculation: 482 R Axis:   109 Text Interpretation:  Sinus rhythm Consider left atrial enlargement Right ventricular hypertrophy No STEMI.  Confirmed by Denim Start MD, Trine Fread [id number]) on 04/19/2016 11:20:46 AM       ____________________________________________  RADIOLOGY  Dg Chest 2 View  Result Date: 04/19/2016 CLINICAL DATA:  Centralized chest pain EXAM: CHEST  2 VIEW COMPARISON:  None. FINDINGS: Left dialysis catheter remains in place, unchanged. Elevation of the right hemidiaphragm with right base atelectasis. Heart is borderline in size. Left lung is clear. No effusions or acute bony abnormality. IMPRESSION: Mild elevation of the right hemidiaphragm with right base atelectasis. Electronically Signed   By: Rolm Baptise M.D.   On: 04/19/2016 12:23   Ct Angio Chest/abd/pel For Dissection W And/or W/wo  Result Date: 04/19/2016 CLINICAL DATA:  Sharp chest pain. EXAM: CT ANGIOGRAPHY CHEST, ABDOMEN AND PELVIS TECHNIQUE: Multidetector CT imaging through the chest, abdomen and pelvis was performed using the standard protocol during bolus  administration of intravenous contrast. Multiplanar reconstructed images and MIPs were obtained and reviewed to evaluate the vascular anatomy. CONTRAST:  100 mL of Isovue 370 COMPARISON:  None. FINDINGS: CTA CHEST FINDINGS Cardiovascular: Evaluation of the thoracic aortic root is limited due to cardiac motion. However, within this limitation, no  aneurysm or dissection is seen. Atherosclerosis is seen in the thoracic aorta and its branching vessels. Coronary artery calcifications are identified. The heart size is borderline. The study was not tailored to evaluate for pulmonary emboli but no central emboli are seen. Mediastinum/Nodes: The dialysis catheter terminates near the inferior caval atrial junction, extending to the right atrium. A small left-sided thyroid nodule of no acute significance, measuring less than a cm. No adenopathy seen in the chest. No effusions. Visualized esophagus is within normal limits. Lungs/Pleura: The central airways are normal. No pneumothorax. Bilateral scattered ground-glass and linear opacities are favored to primarily represent atelectasis. No overt edema or suspicious focal infiltrate. Evaluation for small pulmonary nodules is limited due to respiratory motion. No suspicious pulmonary nodules or masses are identified within this limitation. Musculoskeletal: No chest wall abnormality. No acute or significant osseous findings. Review of the MIP images confirms the above findings. CTA ABDOMEN AND PELVIS FINDINGS VASCULAR Aorta: Severe atherosclerotic change identified with an ulcerating plaque on series 601, image 187 to the left. No dissection or severe occlusion. No aneurysm. Celiac: Atherosclerosis. No other abnormality. No significant stenosis. SMA: Atherosclerosis without high-grade stenosis, aneurysm, or dissection. Renals: Atherosclerosis is associated with both renal arteries. IMA: The IMA remains patent. Inflow: Patent without evidence of aneurysm, dissection, vasculitis or  significant stenosis. Veins: Poorly evaluated due to timing of contrast but no abnormalities are seen. Review of the MIP images confirms the above findings. NON-VASCULAR Hepatobiliary: Previous cholecystectomy. Limited views of the portal veins are normal. No suspicious masses or other abnormalities. Pancreas: Unremarkable. No pancreatic ductal dilatation or surrounding inflammatory changes. Spleen: Normal in size without focal abnormality. Adrenals/Urinary Tract: There is a nodule associated with the left adrenal gland measuring 18 by 12 mm. The right adrenal gland is normal. The kidneys are relatively atrophic an exhibit multiple cysts. There is a rounded region of higher attenuation in the left kidney as seen on series 601, image 149 with an attenuation of 55 Hounsfield units measuring 2.1 x 2.7 x 3.0 cm. Stomach/Bowel: Evaluation of the bowel is limited without oral contrast. Within this limitation, the stomach and small bowel are normal. Colonic diverticulosis is seen without diverticulitis. The appendix is not seen but there is no evidence of appendicitis. Lymphatic: No adenopathy. Reproductive: Status post hysterectomy. No adnexal masses. Other: There is a fat containing ventral hernia to the right. Musculoskeletal: Multilevel degenerative changes seen in the spine. No acute bony abnormalities. Review of the MIP images confirms the above findings. IMPRESSION: 1. No aortic aneurysm or dissection detected. Moderate to severe atherosclerotic changes are seen, particularly in the abdomen. Evaluation of the ascending thoracic aorta is limited due to cardiac motion but there is no definitive evidence of aneurysm or dissection. An ulcerating plaque is seen in the abdominal aorta but no dissection is noted. High-grade stenosis is seen in the proximal internal iliac arteries. 2. There is a mass in the left kidney which could represent a hyperdense cyst or malignancy. An MRI could better evaluate. 3. Left adrenal  nodule. Recommend attention to the left adrenal nodule on the recommended MRI. Electronically Signed   By: Dorise Bullion III M.D   On: 04/19/2016 17:31    ____________________________________________   PROCEDURES  Procedure(s) performed:   Procedures  None ____________________________________________   INITIAL IMPRESSION / ASSESSMENT AND PLAN / ED COURSE  Pertinent labs & imaging results that were available during my care of the patient were reviewed by me and considered in my medical decision making (see  chart for details).  Patient resents to the emergency department for evaluation of chest pain. She is overall well-appearing. Chest pain improved with nitroglycerin. She has multiple medical comorbidities and is high risk for acute coronary syndrome. Lower suspicion for pulmonary embolus with associated dyspnea and on the vital signs. Plan for troponin, chest x-ray, and labs. We'll also offer a GI cocktail given the pains association with eating although this could be a coincidence.   Discussed case with the hospitalist who will be down for evaluation and admission.  ____________________________________________  FINAL CLINICAL IMPRESSION(S) / ED DIAGNOSES  Final diagnoses:  Nonspecific chest pain  Sharp pain     MEDICATIONS GIVEN DURING THIS VISIT:  Medications  ARIPiprazole (ABILIFY) tablet 1 mg (not administered)  DULoxetine (CYMBALTA) DR capsule 30 mg (not administered)  atorvastatin (LIPITOR) tablet 40 mg (not administered)  albuterol (PROVENTIL) (2.5 MG/3ML) 0.083% nebulizer solution 3 mL (not administered)  cinacalcet (SENSIPAR) tablet 90 mg (not administered)  pregabalin (LYRICA) capsule 100 mg (100 mg Oral Given 04/19/16 1851)  cloNIDine (CATAPRES) tablet 0.2 mg (0.2 mg Oral Given 04/19/16 1851)  acetaminophen (TYLENOL) tablet 650 mg (not administered)  ondansetron (ZOFRAN) injection 4 mg (not administered)  enoxaparin (LOVENOX) injection 30 mg (30 mg  Subcutaneous Given 04/19/16 1851)  pantoprazole (PROTONIX) EC tablet 40 mg (not administered)  metoprolol succinate (TOPROL-XL) 24 hr tablet 150 mg (not administered)  aspirin chewable tablet 243 mg (243 mg Oral Given 04/19/16 1140)  gi cocktail (Maalox,Lidocaine,Donnatal) (30 mLs Oral Given 04/19/16 1140)  iopamidol (ISOVUE-370) 76 % injection (100 mLs  Contrast Given 04/19/16 1612)     NEW OUTPATIENT MEDICATIONS STARTED DURING THIS VISIT:  None   Note:  This document was prepared using Dragon voice recognition software and may include unintentional dictation errors.  Nanda Quinton, MD Emergency Medicine   Margette Fast, MD 04/19/16 [phone]

## 2016-04-19 NOTE — H&P (Signed)
[facility] Admission History and Physical Service Pager: [phone]  Patient name: [name] Medical record number: [id number] Date of birth: 1941-01-31 Age: 75 y.o. Gender: female  Primary Care Provider: Junie Panning, DO Consultants: None Code Status: Full  Chief Complaint: "Chest pain"  Assessment and Plan: [name] is a 75 y.o. female presenting with chest pain. PMH is significant for HFpEF, HTN, ESRD (M,W,F), Tobacco Abuse, COPD, HLD, Gout, H/o TIA.  #Chest Pain / HFpEF / CAD / HLD, Acute, Improved: Substernal chest pain radiating to back associated with exertion/multitasking (breakfast and doing wash) alleviated by nitro by EMS. Improved during admission but reproducible with palpation. Heart score 5. Wells score 0. EKG NSR w/o ST changes or T-wave abnormalities. Initial troponin 0.01. Vitals stable with BP 130-140/40-50. No respiratory distress on RA. Given GI cocktail. Takes ASA 81 mg daily. On statin therapy. Multiple antihypertensives include clonidine, losartan, toprolol-XL. Has a 60 pack year history, currently smokes. Last ECHO 01/2015 EF 60-65%, no wall motion abnormalities, and G1DD. H/o L-heart cath 11/2011 which showed minimal multivessel CAD. Concerned for aortic dissection given pain location (sharp pain, radiating to the back)  and smoking hx. No signs of MI at this time. PE seems unlikely with negative Wells Criteria. Could be GERD given eating as enticing factor. Lipase normal so unlikely pancreatitis. Possibly MSK though no inciting trauma per patient, however given patient's presentation, will r/o other concerns. - Admit to Dr. Erin Hearing teaching service, bservation, Telemetry  --Will obtain CTA for aneurysm dissection r/o --Serial troponin --Telemetry  --Repeat EKG in AM  --Lipitor 40 mg QD --Zofran IV q6h PRN  #HTN - Blood pressure well controlled, home regiment includes Clonidine 0.2 mg TID, Metoprolol XL 150 mg, Losartan 100 mg  an Amlodipine 10 mg - Will continue Clonidine 0.2mg  TID and Metoprolol XL-150 mg  - Will slowly add back of blood pressure medications as required per patient blood pressure  #Chronic Kidney Disease, ESRD on HD, Stable: Mature graft in left forearm. Sees nephrologist Dr. Arty Baumgartner. HD on MWF, last on 11/17. --Nephrology consulted, appreciate recs, plan to HD 11/19 given holiday this week interfering with MWF schedule  #COPD / Tobacco Abuse, Stable: Has a 60 pack year history, current smoker. Has HTN, CAD. Takes atrovent and albuterol PRN at home. --Albuterol q4h PRN   #GERD, Chronic, Stable: Likely source of chest pain. Given GI cocktail. Seems to have improved. --Protonix 40 mg QD  #Peripheral Neuropathy, Chronic, Stable: --Lyrica 100 mg QD  #Gout, Chronic, Stable: Takes allopurinol for HD days. --Holding allopruinol  #Depression, Chronic, Stable: No depressive symptoms on exam. --Cymbalta 30 mg QD --Abilify 1 mg QD  FEN/GI: Renal diet, PPI Prophylaxis: Lovenox SQ  Disposition: Pending ACS work-up, anticipate d/c home  History of Present Illness:  [name] is a 75 y.o. female presenting with sudden onset of chest pain this morning. Onset of chest pain occurred when patient was washing clothes and eating oatmeal at the same time. She describes the chest pain as mid-sternal, stabbing, radiated into the back. Patient states that this pain was nothing like her reflux. Patient felt weak, like she was going to pass out. She was slightly diaphoretic. She endorsed being SOB. She indicated that she was also very nauseated during this event. Patient called EMS and they provide nitroglycerin. Patient states after receiving the nitroglycerin,  pain from 7 to 2.  Patient is a current smoker (smokes a pack a day for 60 years). No prior hx  chest pain, though per review of chart had cath in 2013. She states she does not remember this well. Family hx includes her father who died of  stroke/heart attack at 32. Patient denied any hx of recent fall or hitting her chest  In the ED, patient received 243 mg of aspirin, had taken her morning dose earlier. Patient then received a GI cocktail in the ED as well. When we admitted patient she said chest pain had mostly resolved and that she only had some soreness there.   Review Of Systems: Complete ROS performed, see HPI for pertinent.  Patient Active Problem List   Diagnosis Date Noted  . Chest pain 04/19/2016  . Leg pain, bilateral 12/05/2015  . Trouble in sleeping 11/22/2015  . End stage renal disease ([place])   . Hyperkalemia 11/12/2015  . Shortness of breath 11/12/2015  . ESRD on dialysis ([place])   . Dyspnea   . Chronic obstructive pulmonary disease ([place])   . Hypertensive urgency 09/20/2015  . Fecal incontinence   . Adverse effects of medication 06/21/2015  . Herpes 06/19/2015  . Skin lesion 06/16/2015  . Genital ulcer, female 06/16/2015  . Onychocryptosis 04/08/2015  . Renal mass   . End stage renal disease on dialysis ([place])   . Inadequate pain control 12/30/2014  . CHF (congestive heart failure) ([place]) 12/30/2014  . Meralgia paresthetica of right side 12/26/2014  . Hereditary and idiopathic peripheral neuropathy 02/15/2013  . Dermatitis of face 08/19/2012  . HIP PAIN, BILATERAL 12/07/2008  . BLADDER PROLAPSE 11/17/2007  . OSTEOARTHRITIS, GENERALIZED, MULTIPLE JOINTS 08/18/2007  . 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 ANXIETY STATE, UNSPECIFIED 04/23/2007  . ANEMIA NEC 03/31/2007  . TOBACCO ABUSE 12/10/2006  . HIATAL HERNIA 12/10/2006  . Dysphagia, unspecified(787.20) 12/10/2006  . HYPERCHOLESTEROLEMIA 07/30/2006  . Gout, unspecified 07/30/2006  . DEPRESSIVE DISORDER, NOS 07/30/2006  . HYPERTENSION, BENIGN SYSTEMIC 07/30/2006  . GASTROESOPHAGEAL REFLUX, NO ESOPHAGITIS 07/30/2006    Past Medical History: Past Medical History:  Diagnosis Date  . Abnormality of gait 03/28/2015  . Adrenal  mass ([place])   . 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 Arthritis   . Back pain   . CHF (congestive heart failure) ([place])   . Chronic kidney disease    [place] MWF  . Constipation   . COPD (chronic obstructive pulmonary disease) ([place])   . Depression   . Dialysis patient [facility])    kidney  . Diverticulitis   . GERD (gastroesophageal reflux disease)   . H/O hiatal hernia   . High cholesterol   . Hyperlipidemia   . Hypertension   . IBS (irritable bowel syndrome)   . Meralgia paresthetica of right side 12/26/2014  . Normal cardiac stress test 12/24/2009   lexiscan, imaging normal  . Renal disorder   . RLS (restless legs syndrome)   . Seizures ([place])    2004  . Sinus complaint   . Thyroid disease   . TIA (transient ischemic attack)   . Tubular adenoma of colon 01/2008    Past Surgical History: Past Surgical History:  Procedure Laterality Date  . ABDOMINAL HYSTERECTOMY    . ANAL RECTAL MANOMETRY N/A 07/25/2015   Procedure: ANO RECTAL MANOMETRY;  Surgeon: Mauri Pole, MD;  Location: [facility];  Service: Endoscopy;  Laterality: N/A;  . APPENDECTOMY    . AV FISTULA PLACEMENT Left 11/11/2012   Procedure: INSERTION OF ARTERIOVENOUS (AV) GORE-TEX GRAFT ARM;  Surgeon: Harrell Gave  Nicole Cella, MD;  Location: [place];  Service: Vascular;  Laterality: Left;  . [place] REMOVAL Left 11/18/2012   Procedure: REMOVAL OF LEFT UPPER ARM ARTERIOVENOUS GORETEX GRAFT ([place]);  Surgeon: Angelia Mould, MD;  Location: [place];  Service: Vascular;  Laterality: Left;  . CARDIAC CATHETERIZATION  2003   normal  . CHOLECYSTECTOMY     Open mid line incision  . frontal cranialcity  2002  . frontal craniotomy  2002   indication = sinusitis  . INSERTION OF DIALYSIS CATHETER     Left  . PATCH ANGIOPLASTY Left 11/18/2012   Procedure: PATCH ANGIOPLASTY;  Surgeon: Angelia Mould, MD;  Location: [place];  Service: Vascular;  Laterality: Left;  . PERIPHERAL VASCULAR  CATHETERIZATION Left 03/08/2015   Procedure: A/V Shuntogram/Fistulagram;  Surgeon: Algernon Huxley, MD;  Location: [place] [facility];  Service: Cardiovascular;  Laterality: Left;  . PERIPHERAL VASCULAR CATHETERIZATION Left 03/08/2015   Procedure: A/V Shunt Intervention;  Surgeon: Algernon Huxley, MD;  Location: [place] [facility];  Service: Cardiovascular;  Laterality: Left;  . RECTAL ULTRASOUND N/A 10/05/2015   Procedure: ANAL ULTRASOUND WITH PROBE;  Surgeon: Leighton Ruff, MD;  Location: [facility];  Service: Endoscopy;  Laterality: N/A;  . REVISON OF ARTERIOVENOUS FISTULA  05/11/2012   Procedure: REVISON OF ARTERIOVENOUS FISTULA;  Surgeon: Elam Dutch, MD;  Location: [place];  Service: Vascular;  Laterality: Right;  . TUBAL LIGATION      Social History: Social History  Substance Use Topics  . Smoking status: Current Every Day Smoker    Packs/day: 1.00    Years: 60.00    Types: Cigarettes    Start date: 06/16/2014    Last attempt to quit: 05/31/2015  . Smokeless tobacco: Never Used     Comment: smoker since 75 yo.  1.5 ppd of [place] 100 lights.   . Alcohol use No   Additional social history: Lives at home with 2 daughters, granddaughter, and niece. Able to perform all ADLs w/o assistance. Drives and occasionally uses cane to ambulate. Please also refer to relevant sections of EMR.  Family History: Family History  Problem Relation Age of Onset  . Thyroid cancer Mother   . Heart disease Father   . Hypertension Father   . Heart attack Father   . Lupus Daughter   . Breast cancer Sister   . Thyroid cancer Sister    Father had stroke age 89.  Allergies and Medications: Allergies  Allergen Reactions  . Tuberculin Tests Hives    "blisters"  . Valacyclovir Other (See Comments)    Confusion and nervousness  . Codeine Nausea And Vomiting  . Penicillins Rash    No problems breathing. Has tolerated omnicef in past without issue Has patient had a PCN reaction causing immediate rash,  facial/tongue/throat swelling, SOB or lightheadedness with hypotension: Yes Has patient had a PCN reaction causing severe rash involving mucus membranes or skin necrosis: No Has patient had a PCN reaction that required hospitalization No Has patient had a PCN reaction occurring within the last 10 years: No If all of the above answers are "NO", then may proceed with Cephalosporin  . Sulfamethoxazole Rash   No current facility-administered medications on file prior to encounter.    Current Outpatient Prescriptions on File Prior to Encounter  Medication Sig Dispense Refill  . acetaminophen (TYLENOL) 500 MG tablet Take 1,000 mg by mouth every 6 (six) hours as needed for mild pain or moderate pain.    Marland Kitchen  albuterol (PROVENTIL HFA;VENTOLIN HFA) 108 (90 Base) MCG/ACT inhaler Inhale 2 puffs into the lungs every 4 (four) hours as needed for wheezing or shortness of breath. 1 Inhaler 1  . allopurinol (ZYLOPRIM) 100 MG tablet Take 100 mg by mouth once daily on non-dialysis days (Tues/Thurs/Sat/Sun) 30 tablet 0  . Aluminum & Magnesium Hydroxide 600-300 MG/5ML CONC Take 5 mLs by mouth every 6 (six) hours as needed for indigestion. 150 mL 0  . amLODipine (NORVASC) 10 MG tablet Take 1 tablet (10 mg total) by mouth at bedtime. 30 tablet 0  . ARIPiprazole (ABILIFY) 2 MG tablet Take 0.5 tablets (1 mg total) by mouth daily. 30 tablet 6  . aspirin EC 81 MG tablet Take 81 mg by mouth every morning.    Marland Kitchen atorvastatin (LIPITOR) 40 MG tablet Take 1 tablet (40 mg total) by mouth daily. 90 tablet 3  . cinacalcet (SENSIPAR) 30 MG tablet Take 30 mg by mouth at bedtime.    . cloNIDine (CATAPRES) 0.2 MG tablet Take 1 tablet (0.2 mg total) by mouth 3 (three) times daily. 90 tablet 0  . diclofenac sodium (VOLTAREN) 1 % GEL Apply topically as needed.    . DULoxetine (CYMBALTA) 30 MG capsule Take 1 capsule (30 mg total) by mouth 2 (two) times daily. 60 capsule 6  . fluticasone (FLONASE) 50 MCG/ACT nasal spray Place 2 sprays into  both nostrils daily as needed for allergies or rhinitis.     Marland Kitchen ipratropium (ATROVENT HFA) 17 MCG/ACT inhaler Inhale 2 puffs into the lungs every 4 (four) hours as needed for wheezing. 1 Inhaler 12  . lidocaine-prilocaine (EMLA) cream Apply 1 application topically 3 (three) times a week. Use before dialysis on MWF    . loratadine (CLARITIN) 10 MG tablet Take 1 tablet (10 mg total) by mouth daily as needed for allergies. 90 tablet 3  . losartan (COZAAR) 100 MG tablet Take 1 tablet (100 mg total) by mouth daily. 30 tablet 0  . metoprolol succinate (TOPROL-XL) 100 MG 24 hr tablet Take 100 mg by mouth at bedtime. Take with or immediately following a meal.    . metoprolol succinate (TOPROL-XL) 50 MG 24 hr tablet Take 50 mg by mouth at bedtime.    . multivitamin (RENA-VIT) TABS tablet Take 1 tablet by mouth at bedtime. 60 tablet 5  . omeprazole (PRILOSEC) 40 MG capsule Take 1 capsule (40 mg total) by mouth 2 (two) times daily. 60 capsule 11  . pregabalin (LYRICA) 75 MG capsule Take 75 mg by mouth every evening.     Marland Kitchen RENVELA 800 MG tablet     . sulindac (CLINORIL) 150 MG tablet     . traMADol (ULTRAM) 50 MG tablet Take by mouth every 6 (six) hours as needed.      Objective: BP (!) 134/54 (BP Location: Right Arm)   Pulse 69   Temp 97.8 F (36.6 C) (Oral)   Resp 17   Ht 5\' 2"  (1.575 m)   Wt 184 lb (83.5 kg)   SpO2 100%   BMI 33.65 kg/m  Exam: General: obese, well nourished, well developed, in no acute distress with non-toxic appearance HEENT: normocephalic, atraumatic, moist mucous membranes, PERRLA, EOMI Neck: supple, non-tender without lymphadenopathy, no JVD CV: regular rate and rhythm with holosystolic murmur, without rubs or gallops Lungs: clear to auscultation bilaterally with normal work of breathing Abdomen: soft, non-tender, no masses or organomegaly palpable, normoactive bowel sounds Skin: warm, dry, no rashes or lesions, cap refill < 2 seconds Extremities:  warm and well perfused,  normal tone, no pitting edema  Labs and Imaging: CBC BMET   Recent Labs Lab 04/19/16 1150  WBC 8.2  HGB 10.6*  HCT 33.1*  PLT 228    Recent Labs Lab 04/19/16 1150  NA 135  K 4.1  CL 97*  CO2 26  BUN 19  CREATININE 6.44*  GLUCOSE 146*  CALCIUM 8.9     Lipase: 33 Troponin: 0.01  DG Chest 2 View (04/19/16) FINDINGS: Left dialysis catheter remains in place, unchanged. Elevation of the right hemidiaphragm with right base atelectasis. Heart is borderline in size. Left lung is clear. No effusions or acute bony abnormality.  IMPRESSION: Mild elevation of the right hemidiaphragm with right base atelectasis.  CT Angio Chest/Abd/Pel for Dissection W and/or W/WO (04/19/16) Pending  Evening Shade Bing, DO 04/19/2016, 1:14 PM PGY-1, [place] [facility] pager: [phone], text pages welcome  UPPER LEVEL ADDENDUM  I have read the above note and made revisions highlighted in blue.  Kerrin Mo, MD, PGY-2 [facility]

## 2016-04-20 DIAGNOSIS — E785 Hyperlipidemia, unspecified: Secondary | ICD-10-CM | POA: Diagnosis not present

## 2016-04-20 DIAGNOSIS — I2510 Atherosclerotic heart disease of native coronary artery without angina pectoris: Secondary | ICD-10-CM | POA: Diagnosis not present

## 2016-04-20 DIAGNOSIS — I132 Hypertensive heart and chronic kidney disease with heart failure and with stage 5 chronic kidney disease, or end stage renal disease: Secondary | ICD-10-CM | POA: Diagnosis not present

## 2016-04-20 DIAGNOSIS — R079 Chest pain, unspecified: Secondary | ICD-10-CM | POA: Diagnosis not present

## 2016-04-20 DIAGNOSIS — R072 Precordial pain: Secondary | ICD-10-CM | POA: Diagnosis not present

## 2016-04-20 DIAGNOSIS — Z992 Dependence on renal dialysis: Secondary | ICD-10-CM | POA: Diagnosis not present

## 2016-04-20 DIAGNOSIS — I5032 Chronic diastolic (congestive) heart failure: Secondary | ICD-10-CM | POA: Diagnosis not present

## 2016-04-20 DIAGNOSIS — D631 Anemia in chronic kidney disease: Secondary | ICD-10-CM | POA: Diagnosis not present

## 2016-04-20 DIAGNOSIS — J449 Chronic obstructive pulmonary disease, unspecified: Secondary | ICD-10-CM | POA: Diagnosis not present

## 2016-04-20 DIAGNOSIS — N186 End stage renal disease: Secondary | ICD-10-CM | POA: Diagnosis not present

## 2016-04-20 DIAGNOSIS — N2581 Secondary hyperparathyroidism of renal origin: Secondary | ICD-10-CM | POA: Diagnosis not present

## 2016-04-20 DIAGNOSIS — F329 Major depressive disorder, single episode, unspecified: Secondary | ICD-10-CM | POA: Diagnosis not present

## 2016-04-20 DIAGNOSIS — R110 Nausea: Secondary | ICD-10-CM | POA: Diagnosis not present

## 2016-04-20 LAB — BASIC METABOLIC PANEL
Anion gap: 14 (ref 5–15)
BUN: 28 mg/dL — AB (ref 6–20)
CHLORIDE: 93 mmol/L — AB (ref 101–111)
CO2: 24 mmol/L (ref 22–32)
Calcium: 8.1 mg/dL — ABNORMAL LOW (ref 8.9–10.3)
Creatinine, Ser: 8.08 mg/dL — ABNORMAL HIGH (ref 0.44–1.00)
GFR calc Af Amer: 5 mL/min — ABNORMAL LOW (ref >60)
GFR calc non Af Amer: 4 mL/min — ABNORMAL LOW (ref >60)
Glucose, Bld: 95 mg/dL (ref 65–99)
POTASSIUM: 3.7 mmol/L (ref 3.5–5.1)
SODIUM: 131 mmol/L — AB (ref 135–145)

## 2016-04-20 LAB — CBC
HCT: 32.5 % — ABNORMAL LOW (ref 36.0–46.0)
HEMOGLOBIN: 10.2 g/dL — AB (ref 12.0–15.0)
MCH: 28.3 pg (ref 26.0–34.0)
MCHC: 31.4 g/dL (ref 30.0–36.0)
MCV: 90 fL (ref 78.0–100.0)
Platelets: 192 10*3/uL (ref 150–400)
RBC: 3.61 MIL/uL — AB (ref 3.87–5.11)
RDW: 17 % — ABNORMAL HIGH (ref 11.5–15.5)
WBC: 6.9 10*3/uL (ref 4.0–10.5)

## 2016-04-20 LAB — TROPONIN I: Troponin I: 0.03 ng/mL (ref <0.03)

## 2016-04-20 MED ORDER — HEPARIN SODIUM (PORCINE) 1000 UNIT/ML DIALYSIS: 2000.0000 [IU] | INTRAMUSCULAR | Status: DC | PRN

## 2016-04-20 MED ORDER — LIDOCAINE-PRILOCAINE 2.5-2.5 % EX CREA
1.0000 "application " | TOPICAL_CREAM | CUTANEOUS | Status: DC | PRN
  Filled 2016-04-20: qty 5

## 2016-04-20 MED ORDER — ALTEPLASE 2 MG IJ SOLR: 2.0000 mg | Freq: Once | INTRAMUSCULAR | Status: DC | PRN

## 2016-04-20 MED ORDER — PENTAFLUOROPROP-TETRAFLUOROETH EX AERO: 1.0000 "application " | INHALATION_SPRAY | CUTANEOUS | Status: DC | PRN

## 2016-04-20 MED ORDER — SODIUM CHLORIDE 0.9 % IV SOLN: 100.0000 mL | INTRAVENOUS | Status: DC | PRN

## 2016-04-20 MED ORDER — HEPARIN SODIUM (PORCINE) 1000 UNIT/ML DIALYSIS: 1000.0000 [IU] | INTRAMUSCULAR | Status: DC | PRN

## 2016-04-20 MED ORDER — ACETAMINOPHEN 325 MG PO TABS
ORAL_TABLET | ORAL | Status: AC
  Filled 2016-04-20: qty 2

## 2016-04-20 MED ORDER — LIDOCAINE HCL (PF) 1 % IJ SOLN: 5.0000 mL | INTRAMUSCULAR | Status: DC | PRN

## 2016-04-20 NOTE — Procedures (Signed)
  I was present at this dialysis session, have reviewed the session itself and made  appropriate changes Kelly Splinter MD [place] pager [phone]   04/20/2016, 2:47 PM

## 2016-04-20 NOTE — Progress Notes (Signed)
IVs removed. Belongings packed. Transportation with son arranged by patient. AVS given to patient. Understanding demonstrated.

## 2016-04-20 NOTE — Progress Notes (Signed)
Family Medicine Teaching Service Daily Progress Note Intern Pager: [phone]  Patient name: [name] Medical record number: [id number] Date of birth: 03/02/1941 Age: 75 y.o. Gender: female  Primary Care Provider: Junie Panning, DO Consultants: Nephrology  Code Status: Fulll  Pt Overview and Major Events to Date:   Assessment and Plan:  [name] is a 75 y.o. female presenting with chest pain. PMH is significant for HFpEF, HTN, ESRD (M,W,F), Tobacco Abuse, COPD, HLD, Gout, H/o TIA.  #Chest Pain / HFpEF / CAD / HLD, Acute, Improved: Substernal chest pain radiating to back associated with exertion/multitasking (breakfast and doing wash) alleviated by nitro by EMS. Improved during admission but reproducible with palpation. Heart score 5. Wells score 0. EKG NSR w/o ST changes or T-wave abnormalities. Initial troponin 0.01. Vitals stable with BP 130-140/40-50. No respiratory distress on RA. Given GI cocktail. Takes ASA 81 mg daily. On statin therapy. Multiple antihypertensives include clonidine, losartan, toprolol-XL. Has a 60 pack year history, currently smokes. Last ECHO 01/2015 EF 60-65%, no wall motion abnormalities, and G1DD. H/o L-heart cath 11/2011 which showed minimal multivessel CAD. Concerned for aortic dissection given pain location (sharp pain, radiating to the back)  and smoking hx. No signs of MI at this time. PE seems unlikely with negative Wells Criteria. Could be GERD given eating as enticing factor. Lipase normal so unlikely pancreatitis. Possibly MSK though no inciting trauma per patient, however given patient's presentation, will r/o other concerns. -- CTA negative for aneurysm, however noted for left adrenal nodule, per radiology could consider MRI, would follow up as an outpatient  -- Serial troponin - negative, pending  -- Repeat EKG in AM - negative for ischemic changes, 1st degree AV block noted  - patient will need cardiology follow up as an outpatient  -- Lipitor 40  mg QD -- Zofran IV q6h PRN  #HTN - Blood pressure well controlled, home regiment includes Clonidine 0.2 mg TID, Metoprolol XL 150 mg, Losartan 100 mg an Amlodipine 10 mg - Will continue Clonidine 0.2mg  TID and Metoprolol XL-150 mg  - Will slowly add back of blood pressure medications as required per patient blood pressure  #Chronic Kidney Disease, ESRD on HD, Stable: Mature graft in left forearm. Sees nephrologist Dr. Arty Baumgartner. HD on MWF, last on 11/17. -- Nephrology consulted, appreciate recs, plan to HD 11/19 given holiday this week interfering with MWF schedule  #COPD / Tobacco Abuse, Stable: Has a 60 pack year history, current smoker. Has HTN, CAD. Takes atrovent and albuterol PRN at home. --Albuterol q4h PRN   #GERD, Chronic, Stable: Likely source of chest pain. Given GI cocktail. Seems to have improved. --Protonix 40 mg QD  #Peripheral Neuropathy, Chronic, Stable: --Lyrica 100 mg QD  #Gout, Chronic, Stable: Takes allopurinol for non-HD days. - per patient took yesterday   #Depression, Chronic, Stable: No depressive symptoms on exam. --Cymbalta 30 mg QD --Abilify 1 mg QD  FEN/GI: Renal diet, PPI Prophylaxis: Lovenox SQ Disposition: home today after HD   Subjective:  Patient doing well. No chest pain today. No SOB, no nausea. Feels ready to go home   Objective: Temp:  [97.8 F (36.6 C)-98.1 F (36.7 C)] 98 F (36.7 C) (11/18 2037) Pulse Rate:  [62-69] 67 (11/18 2037) Resp:  [15-21] 18 (11/18 2037) BP: (108-147)/(41-55) 146/42 (11/18 2037) SpO2:  [94 %-100 %] 97 % (11/18 2037) Weight:  [184 lb (83.5 kg)] 184 lb (83.5 kg) (11/18 1115) Physical Exam: General: Lying in bed, NAD  Cardiovascular: RRR, no  murmurs  Respiratory: CTAB, Abdomen: BS+, no ttp  Extremities: No lower extremity edema   Laboratory:  Recent Labs Lab 04/19/16 1150 04/20/16 0432  WBC 8.2 6.9  HGB 10.6* 10.2*  HCT 33.1* 32.5*  PLT 228 192    Recent Labs Lab 04/19/16 1150  04/19/16 1655 04/20/16 0432  NA 135 134* 131*  K 4.1 4.0 3.7  CL 97* 96* 93*  CO2 26 26 24   BUN 19 22* 28*  CREATININE 6.44* 6.78* 8.08*  CALCIUM 8.9 8.4* 8.1*  PROT 6.9  --   --   BILITOT 0.4  --   --   ALKPHOS 86  --   --   ALT 12*  --   --   AST 19  --   --   GLUCOSE 146* 82 95   EKG - 1st degree AV block, no signs of ischemia noted  Imaging/Diagnostic Tests Dg Chest 2 View  Result Date: 04/19/2016 CLINICAL DATA:  Centralized chest pain EXAM: CHEST  2 VIEW COMPARISON:  None. FINDINGS: Left dialysis catheter remains in place, unchanged. Elevation of the right hemidiaphragm with right base atelectasis. Heart is borderline in size. Left lung is clear. No effusions or acute bony abnormality. IMPRESSION: Mild elevation of the right hemidiaphragm with right base atelectasis. Electronically Signed   By: Rolm Baptise M.D.   On: 04/19/2016 12:23   Ct Angio Chest/abd/pel For Dissection W And/or W/wo  Result Date: 04/19/2016 CLINICAL DATA:  Sharp chest pain. EXAM: CT ANGIOGRAPHY CHEST, ABDOMEN AND PELVIS TECHNIQUE: Multidetector CT imaging through the chest, abdomen and pelvis was performed using the standard protocol during bolus administration of intravenous contrast. Multiplanar reconstructed images and MIPs were obtained and reviewed to evaluate the vascular anatomy. CONTRAST:  100 mL of Isovue 370 COMPARISON:  None. FINDINGS: CTA CHEST FINDINGS Cardiovascular: Evaluation of the thoracic aortic root is limited due to cardiac motion. However, within this limitation, no aneurysm or dissection is seen. Atherosclerosis is seen in the thoracic aorta and its branching vessels. Coronary artery calcifications are identified. The heart size is borderline. The study was not tailored to evaluate for pulmonary emboli but no central emboli are seen. Mediastinum/Nodes: The dialysis catheter terminates near the inferior caval atrial junction, extending to the right atrium. A small left-sided thyroid nodule  of no acute significance, measuring less than a cm. No adenopathy seen in the chest. No effusions. Visualized esophagus is within normal limits. Lungs/Pleura: The central airways are normal. No pneumothorax. Bilateral scattered ground-glass and linear opacities are favored to primarily represent atelectasis. No overt edema or suspicious focal infiltrate. Evaluation for small pulmonary nodules is limited due to respiratory motion. No suspicious pulmonary nodules or masses are identified within this limitation. Musculoskeletal: No chest wall abnormality. No acute or significant osseous findings. Review of the MIP images confirms the above findings. CTA ABDOMEN AND PELVIS FINDINGS VASCULAR Aorta: Severe atherosclerotic change identified with an ulcerating plaque on series 601, image 187 to the left. No dissection or severe occlusion. No aneurysm. Celiac: Atherosclerosis. No other abnormality. No significant stenosis. SMA: Atherosclerosis without high-grade stenosis, aneurysm, or dissection. Renals: Atherosclerosis is associated with both renal arteries. IMA: The IMA remains patent. Inflow: Patent without evidence of aneurysm, dissection, vasculitis or significant stenosis. Veins: Poorly evaluated due to timing of contrast but no abnormalities are seen. Review of the MIP images confirms the above findings. NON-VASCULAR Hepatobiliary: Previous cholecystectomy. Limited views of the portal veins are normal. No suspicious masses or other abnormalities. Pancreas: Unremarkable. No pancreatic ductal  dilatation or surrounding inflammatory changes. Spleen: Normal in size without focal abnormality. Adrenals/Urinary Tract: There is a nodule associated with the left adrenal gland measuring 18 by 12 mm. The right adrenal gland is normal. The kidneys are relatively atrophic an exhibit multiple cysts. There is a rounded region of higher attenuation in the left kidney as seen on series 601, image 149 with an attenuation of 55  Hounsfield units measuring 2.1 x 2.7 x 3.0 cm. Stomach/Bowel: Evaluation of the bowel is limited without oral contrast. Within this limitation, the stomach and small bowel are normal. Colonic diverticulosis is seen without diverticulitis. The appendix is not seen but there is no evidence of appendicitis. Lymphatic: No adenopathy. Reproductive: Status post hysterectomy. No adnexal masses. Other: There is a fat containing ventral hernia to the right. Musculoskeletal: Multilevel degenerative changes seen in the spine. No acute bony abnormalities. Review of the MIP images confirms the above findings. IMPRESSION: 1. No aortic aneurysm or dissection detected. Moderate to severe atherosclerotic changes are seen, particularly in the abdomen. Evaluation of the ascending thoracic aorta is limited due to cardiac motion but there is no definitive evidence of aneurysm or dissection. An ulcerating plaque is seen in the abdominal aorta but no dissection is noted. High-grade stenosis is seen in the proximal internal iliac arteries. 2. There is a mass in the left kidney which could represent a hyperdense cyst or malignancy. An MRI could better evaluate. 3. Left adrenal nodule. Recommend attention to the left adrenal nodule on the recommended MRI. Electronically Signed   By: Dorise Bullion III M.D   On: 04/19/2016 17:31    Coriana Angello Cletis Media, MD 04/20/2016, 6:08 AM PGY-2, [facility] Intern pager: [phone], text pages welcome

## 2016-04-20 NOTE — Consult Note (Signed)
[facility] Renal Consultation Note    Indication for Consultation:  Management of ESRD/hemodialysis; anemia, hypertension/volume and secondary hyperparathyroidism PCP: Junie Panning, DO  HPI: [name] is a 75 y.o. 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 Her medical history is significant for hypertension, COPD, CHF, GERD, IBS, neuropathy, seizure disorder. She presented to ED via EMS yesterday with sudden onset of sternal chest and back pain. Chest pain was relived with nitroglycerin. ED course significant for EKG with NSR Troponin 0.01 CT Angio Chest neg for aortic aneurysm or dissection. She was admitted currently under observation status and will require HD today.  Vitals WNL today BP 146/57 SpO2 98% RR 15 Pulse 60 Labs: Na 131 K 3.7 WBC 6.9 Hgb 10.2. She was given a GI cocktail which seems to have improved pain. She denies any chest pain or SOB currently. Her main complaint is her back being sore from lying in bed all night. Her last HD was 11/17 at [facility].   Past Medical History:  Diagnosis Date  . Abnormality of gait 03/28/2015  . Adrenal mass ([place])   . 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 Arthritis   . Back pain   . CHF (congestive heart failure) ([place])   . Chronic kidney disease    Hemo MWF  . Constipation   . COPD (chronic obstructive pulmonary disease) ([place])   . Depression   . Dialysis patient [facility])    kidney  . Diverticulitis   . GERD (gastroesophageal reflux disease)   . H/O hiatal hernia   . High cholesterol   . Hyperlipidemia   . Hypertension   . IBS (irritable bowel syndrome)   . Meralgia paresthetica of right side 12/26/2014  . Normal cardiac stress test 12/24/2009   lexiscan, imaging normal  . Renal disorder   . RLS (restless legs syndrome)   . Seizures ([place])    2004  . Sinus complaint   . Thyroid disease   . TIA (transient ischemic attack)   . Tubular adenoma of colon 01/2008   Past  Surgical History:  Procedure Laterality Date  . ABDOMINAL HYSTERECTOMY    . ANAL RECTAL MANOMETRY N/A 07/25/2015   Procedure: ANO RECTAL MANOMETRY;  Surgeon: Mauri Pole, MD;  Location: [facility];  Service: Endoscopy;  Laterality: N/A;  . APPENDECTOMY    . AV FISTULA PLACEMENT Left 11/11/2012   Procedure: INSERTION OF ARTERIOVENOUS (AV) GORE-TEX GRAFT ARM;  Surgeon: Angelia Mould, MD;  Location: [place];  Service: Vascular;  Laterality: Left;  . [place] REMOVAL Left 11/18/2012   Procedure: REMOVAL OF LEFT UPPER ARM ARTERIOVENOUS GORETEX GRAFT ([place]);  Surgeon: Angelia Mould, MD;  Location: [employer];  Service: Vascular;  Laterality: Left;  . CARDIAC CATHETERIZATION  2003   normal  . CHOLECYSTECTOMY     Open mid line incision  . frontal cranialcity  2002  . frontal craniotomy  2002   indication = sinusitis  . INSERTION OF DIALYSIS CATHETER     Left  . PATCH ANGIOPLASTY Left 11/18/2012   Procedure: PATCH ANGIOPLASTY;  Surgeon: Angelia Mould, MD;  Location: [place];  Service: Vascular;  Laterality: Left;  . PERIPHERAL VASCULAR CATHETERIZATION Left 03/08/2015   Procedure: A/V Shuntogram/Fistulagram;  Surgeon: Algernon Huxley, MD;  Location: [place] CV LAB;  Service: Cardiovascular;  Laterality: Left;  . PERIPHERAL VASCULAR CATHETERIZATION Left 03/08/2015   Procedure: A/V Shunt Intervention;  Surgeon:  Algernon Huxley, MD;  Location: [place] CV LAB;  Service: Cardiovascular;  Laterality: Left;  . RECTAL ULTRASOUND N/A 10/05/2015   Procedure: ANAL ULTRASOUND WITH PROBE;  Surgeon: Leighton Ruff, MD;  Location: [facility];  Service: Endoscopy;  Laterality: N/A;  . REVISON OF ARTERIOVENOUS FISTULA  05/11/2012   Procedure: REVISON OF ARTERIOVENOUS FISTULA;  Surgeon: Elam Dutch, MD;  Location: [place];  Service: Vascular;  Laterality: Right;  . TUBAL LIGATION     Family History  Problem Relation Age of Onset  . Thyroid cancer Mother   . Heart disease Father   .  Hypertension Father   . Heart attack Father   . Lupus Daughter   . Breast cancer Sister   . Thyroid cancer Sister    Social History:  reports that she has been smoking Cigarettes.  She started smoking about 22 months ago. She has a 60.00 pack-year smoking history. She has never used smokeless tobacco. She reports that she does not drink alcohol or use drugs. Allergies  Allergen Reactions  . Tuberculin Tests Hives    "blisters"  . Valacyclovir Other (See Comments)    Confusion and nervousness  . Codeine Nausea And Vomiting  . Penicillins Rash    No problems breathing. Has tolerated omnicef in past without issue Has patient had a PCN reaction causing immediate rash, facial/tongue/throat swelling, SOB or lightheadedness with hypotension: Yes Has patient had a PCN reaction causing severe rash involving mucus membranes or skin necrosis: No Has patient had a PCN reaction that required hospitalization No Has patient had a PCN reaction occurring within the last 10 years: No If all of the above answers are "NO", then may proceed with Cephalosporin  . Sulfamethoxazole Rash   Prior to Admission medications   Medication Sig Start Date End Date Taking? Authorizing Provider  allopurinol (ZYLOPRIM) 100 MG tablet Take 100 mg by mouth once daily on non-dialysis days (Tues/Thurs/Sat/Sun) 11/13/15  Yes Smiley Houseman, MD  ARIPiprazole (ABILIFY) 2 MG tablet Take 0.5 tablets (1 mg total) by mouth daily. 02/27/16  Yes Fedora N Rumley, DO  aspirin EC 81 MG tablet Take 81 mg by mouth every morning.   Yes Historical Provider, MD  atorvastatin (LIPITOR) 40 MG tablet Take 1 tablet (40 mg total) by mouth daily. 02/26/15  Yes  N Rumley, DO  cinacalcet (SENSIPAR) 30 MG tablet Take 90 mg by mouth at bedtime.    Yes Historical Provider, MD  cloNIDine (CATAPRES) 0.2 MG tablet Take 1 tablet (0.2 mg total) by mouth 3 (three) times daily. 11/13/15  Yes Smiley Houseman, MD  DULoxetine (CYMBALTA) 30 MG  capsule Take 1 capsule (30 mg total) by mouth 2 (two) times daily. Patient taking differently: Take 30 mg by mouth daily.  11/16/15  Yes Melvenia Beam, MD  Homeopathic Products (LEG CRAMP RELIEF PO) Take 1-2 tablets by mouth as needed (for leg pain).   Yes Historical Provider, MD  lidocaine-prilocaine (EMLA) cream Apply 1 application topically 3 (three) times a week. Use before dialysis on MWF   Yes Historical Provider, MD  losartan (COZAAR) 100 MG tablet Take 1 tablet (100 mg total) by mouth daily. 11/13/15  Yes Smiley Houseman, MD  metoprolol succinate (TOPROL-XL) 100 MG 24 hr tablet Take 100 mg by mouth at bedtime. Take with or immediately following a meal.   Yes Historical Provider, MD  metoprolol succinate (TOPROL-XL) 50 MG 24 hr tablet Take 50 mg by mouth at bedtime. 10/15/15  Yes Historical  Provider, MD  multivitamin (RENA-VIT) TABS tablet Take 1 tablet by mouth at bedtime. Patient taking differently: Take 1 tablet by mouth every morning.  01/01/15  Yes Alyssa A Lincoln Brigham, MD  omeprazole (PRILOSEC) 40 MG capsule Take 1 capsule (40 mg total) by mouth 2 (two) times daily. 12/27/15  Yes Ladene Artist, MD  pregabalin (LYRICA) 100 MG capsule Take 100 mg by mouth every evening.    Yes Historical Provider, MD  acetaminophen (TYLENOL) 500 MG tablet Take 1,000 mg by mouth every 6 (six) hours as needed for mild pain or moderate pain.    Historical Provider, MD  albuterol (PROVENTIL HFA;VENTOLIN HFA) 108 (90 Base) MCG/ACT inhaler Inhale 2 puffs into the lungs every 4 (four) hours as needed for wheezing or shortness of breath. 06/05/15   Janora Norlander, DO  Aluminum & Magnesium Hydroxide 600-300 MG/5ML CONC Take 5 mLs by mouth every 6 (six) hours as needed for indigestion. Patient not taking: Reported on 04/19/2016 11/02/15   Asiyah Cletis Media, MD  amLODipine (NORVASC) 10 MG tablet Take 1 tablet (10 mg total) by mouth at bedtime. Patient not taking: Reported on 04/19/2016 11/13/15   Smiley Houseman, MD   fluticasone [facility]) 50 MCG/ACT nasal spray Place 2 sprays into both nostrils daily as needed for allergies or rhinitis.     Historical Provider, MD  ipratropium (ATROVENT HFA) 17 MCG/ACT inhaler Inhale 2 puffs into the lungs every 4 (four) hours as needed for wheezing. 06/05/15   Ashly Windell Moulding, DO  loratadine (CLARITIN) 10 MG tablet Take 1 tablet (10 mg total) by mouth daily as needed for allergies. 09/27/14   Coral Spikes, DO   Current Facility-Administered Medications  Medication Dose Route Frequency Provider Last Rate Last Dose  . 0.9 %  sodium chloride infusion  100 mL Intravenous PRN Roney Jaffe, MD      . 0.9 %  sodium chloride infusion  100 mL Intravenous PRN Roney Jaffe, MD      . acetaminophen (TYLENOL) tablet 650 mg  650 mg Oral Q4H PRN Asiyah Cletis Media, MD      . albuterol (PROVENTIL) (2.5 MG/3ML) 0.083% nebulizer solution 3 mL  3 mL Inhalation Q4H PRN Asiyah Cletis Media, MD      . alteplase (CATHFLO ACTIVASE) injection 2 mg  2 mg Intracatheter Once PRN Roney Jaffe, MD      . ARIPiprazole (ABILIFY) tablet 1 mg  1 mg Oral Daily Asiyah Cletis Media, MD      . atorvastatin (LIPITOR) tablet 40 mg  40 mg Oral Daily Asiyah Cletis Media, MD      . cinacalcet (SENSIPAR) tablet 90 mg  90 mg Oral QHS Asiyah Cletis Media, MD   90 mg at 04/19/16 2157  . cloNIDine (CATAPRES) tablet 0.2 mg  0.2 mg Oral TID Asiyah Cletis Media, MD   0.2 mg at 04/19/16 1851  . DULoxetine (CYMBALTA) DR capsule 30 mg  30 mg Oral Daily Asiyah Cletis Media, MD      . enoxaparin (LOVENOX) injection 30 mg  30 mg Subcutaneous Q24H Asiyah Cletis Media, MD   30 mg at 04/19/16 1851  . heparin injection 1,000 Units  1,000 Units Dialysis PRN Roney Jaffe, MD      . Derrill Memo ON 04/21/2016] heparin injection 2,000 Units  2,000 Units Dialysis PRN Roney Jaffe, MD      . lidocaine (PF) (XYLOCAINE) 1 % injection 5 mL  5 mL Intradermal PRN Roney Jaffe, MD      .  lidocaine-prilocaine (EMLA) cream 1 application  1  application Topical PRN Roney Jaffe, MD      . metoprolol succinate (TOPROL-XL) 24 hr tablet 150 mg  150 mg Oral QHS [place] Bing, DO   150 mg at 04/19/16 2157  . ondansetron (ZOFRAN) injection 4 mg  4 mg Intravenous Q6H PRN Asiyah Cletis Media, MD      . pantoprazole (PROTONIX) EC tablet 40 mg  40 mg Oral BID [place] Bing, DO   40 mg at 04/19/16 2157  . pentafluoroprop-tetrafluoroeth (GEBAUERS) aerosol 1 application  1 application Topical PRN Roney Jaffe, MD      . pregabalin (LYRICA) capsule 100 mg  100 mg Oral QPM Asiyah Cletis Media, MD   100 mg at 04/19/16 1851   Labs: Basic Metabolic Panel:  Recent Labs Lab 04/19/16 1150 04/19/16 1655 04/20/16 0432  NA 135 134* 131*  K 4.1 4.0 3.7  CL 97* 96* 93*  CO2 26 26 24   GLUCOSE 146* 82 95  BUN 19 22* 28*  CREATININE 6.44* 6.78* 8.08*  CALCIUM 8.9 8.4* 8.1*  PHOS  --  6.8*  --    Liver Function Tests:  Recent Labs Lab 04/19/16 1150 04/19/16 1655  AST 19  --   ALT 12*  --   ALKPHOS 86  --   BILITOT 0.4  --   PROT 6.9  --   ALBUMIN 3.0* 2.8*    Recent Labs Lab 04/19/16 1150  LIPASE 33   No results for input(s): AMMONIA in the last 168 hours. CBC:  Recent Labs Lab 04/19/16 1150 04/20/16 0432  WBC 8.2 6.9  NEUTROABS 5.5  --   HGB 10.6* 10.2*  HCT 33.1* 32.5*  MCV 89.7 90.0  PLT 228 192   Cardiac Enzymes:  Recent Labs Lab 04/19/16 1655 04/19/16 1924 04/19/16 2210 04/20/16 0609  TROPONINI <0.03 <0.03 <0.03 0.03*   CBG: No results for input(s): GLUCAP in the last 168 hours. Iron Studies: No results for input(s): IRON, TIBC, TRANSFERRIN, FERRITIN in the last 72 hours. Studies/Results: Dg Chest 2 View  Result Date: 04/19/2016 CLINICAL DATA:  Centralized chest pain EXAM: CHEST  2 VIEW COMPARISON:  None. FINDINGS: Left dialysis catheter remains in place, unchanged. Elevation of the right hemidiaphragm with right base atelectasis. Heart is borderline in size. Left lung is clear. No effusions or  acute bony abnormality. IMPRESSION: Mild elevation of the right hemidiaphragm with right base atelectasis. Electronically Signed   By: Rolm Baptise M.D.   On: 04/19/2016 12:23   Ct Angio Chest/abd/pel For Dissection W And/or W/wo  Result Date: 04/19/2016 CLINICAL DATA:  Sharp chest pain. EXAM: CT ANGIOGRAPHY CHEST, ABDOMEN AND PELVIS TECHNIQUE: Multidetector CT imaging through the chest, abdomen and pelvis was performed using the standard protocol during bolus administration of intravenous contrast. Multiplanar reconstructed images and MIPs were obtained and reviewed to evaluate the vascular anatomy. CONTRAST:  100 mL of Isovue 370 COMPARISON:  None. FINDINGS: CTA CHEST FINDINGS Cardiovascular: Evaluation of the thoracic aortic root is limited due to cardiac motion. However, within this limitation, no aneurysm or dissection is seen. Atherosclerosis is seen in the thoracic aorta and its branching vessels. Coronary artery calcifications are identified. The heart size is borderline. The study was not tailored to evaluate for pulmonary emboli but no central emboli are seen. Mediastinum/Nodes: The dialysis catheter terminates near the inferior caval atrial junction, extending to the right atrium. A small left-sided thyroid nodule of no acute significance, measuring less than a cm. No  adenopathy seen in the chest. No effusions. Visualized esophagus is within normal limits. Lungs/Pleura: The central airways are normal. No pneumothorax. Bilateral scattered ground-glass and linear opacities are favored to primarily represent atelectasis. No overt edema or suspicious focal infiltrate. Evaluation for small pulmonary nodules is limited due to respiratory motion. No suspicious pulmonary nodules or masses are identified within this limitation. Musculoskeletal: No chest wall abnormality. No acute or significant osseous findings. Review of the MIP images confirms the above findings. CTA ABDOMEN AND PELVIS FINDINGS VASCULAR  Aorta: Severe atherosclerotic change identified with an ulcerating plaque on series 601, image 187 to the left. No dissection or severe occlusion. No aneurysm. Celiac: Atherosclerosis. No other abnormality. No significant stenosis. SMA: Atherosclerosis without high-grade stenosis, aneurysm, or dissection. Renals: Atherosclerosis is associated with both renal arteries. IMA: The IMA remains patent. Inflow: Patent without evidence of aneurysm, dissection, vasculitis or significant stenosis. Veins: Poorly evaluated due to timing of contrast but no abnormalities are seen. Review of the MIP images confirms the above findings. NON-VASCULAR Hepatobiliary: Previous cholecystectomy. Limited views of the portal veins are normal. No suspicious masses or other abnormalities. Pancreas: Unremarkable. No pancreatic ductal dilatation or surrounding inflammatory changes. Spleen: Normal in size without focal abnormality. Adrenals/Urinary Tract: There is a nodule associated with the left adrenal gland measuring 18 by 12 mm. The right adrenal gland is normal. The kidneys are relatively atrophic an exhibit multiple cysts. There is a rounded region of higher attenuation in the left kidney as seen on series 601, image 149 with an attenuation of 55 Hounsfield units measuring 2.1 x 2.7 x 3.0 cm. Stomach/Bowel: Evaluation of the bowel is limited without oral contrast. Within this limitation, the stomach and small bowel are normal. Colonic diverticulosis is seen without diverticulitis. The appendix is not seen but there is no evidence of appendicitis. Lymphatic: No adenopathy. Reproductive: Status post hysterectomy. No adnexal masses. Other: There is a fat containing ventral hernia to the right. Musculoskeletal: Multilevel degenerative changes seen in the spine. No acute bony abnormalities. Review of the MIP images confirms the above findings. IMPRESSION: 1. No aortic aneurysm or dissection detected. Moderate to severe atherosclerotic changes  are seen, particularly in the abdomen. Evaluation of the ascending thoracic aorta is limited due to cardiac motion but there is no definitive evidence of aneurysm or dissection. An ulcerating plaque is seen in the abdominal aorta but no dissection is noted. High-grade stenosis is seen in the proximal internal iliac arteries. 2. There is a mass in the left kidney which could represent a hyperdense cyst or malignancy. An MRI could better evaluate. 3. Left adrenal nodule. Recommend attention to the left adrenal nodule on the recommended MRI. Electronically Signed   By: Dorise Bullion III M.D   On: 04/19/2016 17:31    ROS: As per HPI otherwise negative.  Physical Exam: Vitals:   04/20/16 0716 04/20/16 0721 04/20/16 0730 04/20/16 0800  BP:   (!) 131/56 (!) 142/55  Pulse:   64 63  Resp: 14 15 15 15   Temp:      TempSrc:      SpO2:      Weight:      Height:         General: WDWN AAF NAD Head: NCAT sclera not icteric MMM Neck: Supple. No JVD Lungs: CTA bilaterally without wheezes, rales, or rhonchi. Breathing is unlabored. Heart: RRR with S1 S2.  Abdomen: soft NT + BS Lower extremities:without edema or ischemic changes, no open wounds  Neuro: A & O  X 3. Moves all extremities spontaneously. Psych:  Responds to questions appropriately with a normal affect. Dialysis Access: LUE AVG cannulated on HD   Dialysis Orders:  [facility] MWF 3:45  400/Auto 1.5  2.0K/2.0 Ca UF profile 4 EDW 81.5kg  L AVG-HeRO Heparin 4000 U IV q HD Hectorol 12mcg IV q HD Mircera 55mcg IV q 4 weeks Venofer 50mg  IV q week   Assessment/Plan: 1.  Atypical chest pain -   EKG - NSR, CT Angio chest neg for aortic dissection/aneurysm - per admit not thought to be of cardiac origin at preseent  2.  ESRD -  MWF - HD today d/t holiday schedule  3.  Hypertension/volume  - BP controlled/-cont home meds/no excess volume on exam   4.  Anemia  - Hgb stable - next ESA due 11/22  5.  Metabolic bone disease -  Ca 8.1 On VDRA/Sensipar   6.  Nutrition - Alb 2.8 Renal diet/protein supp 7. GERD - per admit - GI cocktail seems to have improved pain  Disp: HD today, likely D/C    Lynnda Child PA-C [place] Pager [phone] 04/20/2016, 8:33 AM   Pt seen, examined and agree w A/P as above.  Kelly Splinter MD [employer] pager [phone]   04/20/2016, 2:45 PM

## 2016-04-21 NOTE — Discharge Summary (Signed)
[facility] Discharge Summary  Patient name: [name] Medical record number: [id number] Date of birth: 07/23/1940 Age: 75 y.o. Gender: female Date of Admission: 04/19/2016  Date of Discharge: 04/20/2016 Admitting Physician: Lind Covert, MD  Primary Care Provider: Junie Panning, DO Consultants: Nephrology  Indication for Hospitalization: Chest pain  Discharge Diagnoses/Problem List:  Chest Pain HFpEF CAD HLD HTN CKD ESRD on HD MWF COPD  Tobacco Abuse Peripheral Neuropathy Gout  Depression  Disposition: Home  Discharge Condition: Stable, improved  Discharge Exam:  General: Lying in bed, NAD  Cardiovascular: RRR, no murmurs  Respiratory: CTAB, Abdomen: BS+, no ttp  Extremities: No lower extremity edema   Brief Hospital Course:  [name] is a 75 y.o. female presenting with chest pain. PMH is significant for HFpEF, HTN, ESRD (M,W,F), Tobacco Abuse, COPD, HLD, Gout, H/o TIA.  Patient presented to ED on 11/18 for substernal CP after eating oatmeal which improved with nitro by EMS. CP was reproducible on palpation upon arrival. VSS and breathing well on RA. Troponin was neg. EKG w/o ST changes or T-wave abnormalities. Initially there was concern for aortic dissection given 60 pack year hx and CAD hx. CTA was neg. Nephrology consulted for HD. Patient underwent HD and had resolution of CP and was d/c with f/u.  Issues for Follow Up:  1. F/u with HD as scheduled MWF 2. F/u with PCP concerning hospitalization on 12/14 3. Protonix for reflux 4. Resume BP medications  Significant Procedures:  11/19: HD  Significant Labs and Imaging:   Recent Labs Lab 04/19/16 1150 04/20/16 0432  WBC 8.2 6.9  HGB 10.6* 10.2*  HCT 33.1* 32.5*  PLT 228 192    Recent Labs Lab 04/19/16 1150 04/19/16 1655 04/20/16 0432  NA 135 134* 131*  K 4.1 4.0 3.7  CL 97* 96* 93*  CO2 26 26 24   GLUCOSE 146* 82 95  BUN 19 22* 28*  CREATININE 6.44*  6.78* 8.08*  CALCIUM 8.9 8.4* 8.1*  PHOS  --  6.8*  --   ALKPHOS 86  --   --   AST 19  --   --   ALT 12*  --   --   ALBUMIN 3.0* 2.8*  --    Imaging/Diagnostic Tests Dg Chest 2 View  Result Date: 04/19/2016 CLINICAL DATA:  Centralized chest pain EXAM: CHEST  2 VIEW COMPARISON:  None. FINDINGS: Left dialysis catheter remains in place, unchanged. Elevation of the right hemidiaphragm with right base atelectasis. Heart is borderline in size. Left lung is clear. No effusions or acute bony abnormality. IMPRESSION: Mild elevation of the right hemidiaphragm with right base atelectasis. Electronically Signed   By: Rolm Baptise M.D.   On: 04/19/2016 12:23   Ct Angio Chest/abd/pel For Dissection W And/or W/wo  Result Date: 04/19/2016 CLINICAL DATA:  Sharp chest pain. EXAM: CT ANGIOGRAPHY CHEST, ABDOMEN AND PELVIS TECHNIQUE: Multidetector CT imaging through the chest, abdomen and pelvis was performed using the standard protocol during bolus administration of intravenous contrast. Multiplanar reconstructed images and MIPs were obtained and reviewed to evaluate the vascular anatomy. CONTRAST:  100 mL of Isovue 370 COMPARISON:  None. FINDINGS: CTA CHEST FINDINGS Cardiovascular: Evaluation of the thoracic aortic root is limited due to cardiac motion. However, within this limitation, no aneurysm or dissection is seen. Atherosclerosis is seen in the thoracic aorta and its branching vessels. Coronary artery calcifications are identified. The heart size is borderline. The study was not tailored to evaluate for pulmonary  emboli but no central emboli are seen. Mediastinum/Nodes: The dialysis catheter terminates near the inferior caval atrial junction, extending to the right atrium. A small left-sided thyroid nodule of no acute significance, measuring less than a cm. No adenopathy seen in the chest. No effusions. Visualized esophagus is within normal limits. Lungs/Pleura: The central airways are normal. No  pneumothorax. Bilateral scattered ground-glass and linear opacities are favored to primarily represent atelectasis. No overt edema or suspicious focal infiltrate. Evaluation for small pulmonary nodules is limited due to respiratory motion. No suspicious pulmonary nodules or masses are identified within this limitation. Musculoskeletal: No chest wall abnormality. No acute or significant osseous findings. Review of the MIP images confirms the above findings. CTA ABDOMEN AND PELVIS FINDINGS VASCULAR Aorta: Severe atherosclerotic change identified with an ulcerating plaque on series 601, image 187 to the left. No dissection or severe occlusion. No aneurysm. Celiac: Atherosclerosis. No other abnormality. No significant stenosis. SMA: Atherosclerosis without high-grade stenosis, aneurysm, or dissection. Renals: Atherosclerosis is associated with both renal arteries. IMA: The IMA remains patent. Inflow: Patent without evidence of aneurysm, dissection, vasculitis or significant stenosis. Veins: Poorly evaluated due to timing of contrast but no abnormalities are seen. Review of the MIP images confirms the above findings. NON-VASCULAR Hepatobiliary: Previous cholecystectomy. Limited views of the portal veins are normal. No suspicious masses or other abnormalities. Pancreas: Unremarkable. No pancreatic ductal dilatation or surrounding inflammatory changes. Spleen: Normal in size without focal abnormality. Adrenals/Urinary Tract: There is a nodule associated with the left adrenal gland measuring 18 by 12 mm. The right adrenal gland is normal. The kidneys are relatively atrophic an exhibit multiple cysts. There is a rounded region of higher attenuation in the left kidney as seen on series 601, image 149 with an attenuation of 55 Hounsfield units measuring 2.1 x 2.7 x 3.0 cm. Stomach/Bowel: Evaluation of the bowel is limited without oral contrast. Within this limitation, the stomach and small bowel are normal. Colonic  diverticulosis is seen without diverticulitis. The appendix is not seen but there is no evidence of appendicitis. Lymphatic: No adenopathy. Reproductive: Status post hysterectomy. No adnexal masses. Other: There is a fat containing ventral hernia to the right. Musculoskeletal: Multilevel degenerative changes seen in the spine. No acute bony abnormalities. Review of the MIP images confirms the above findings. IMPRESSION: 1. No aortic aneurysm or dissection detected. Moderate to severe atherosclerotic changes are seen, particularly in the abdomen. Evaluation of the ascending thoracic aorta is limited due to cardiac motion but there is no definitive evidence of aneurysm or dissection. An ulcerating plaque is seen in the abdominal aorta but no dissection is noted. High-grade stenosis is seen in the proximal internal iliac arteries. 2. There is a mass in the left kidney which could represent a hyperdense cyst or malignancy. An MRI could better evaluate. 3. Left adrenal nodule. Recommend attention to the left adrenal nodule on the recommended MRI. Electronically Signed   By: Dorise Bullion III M.D   On: 04/19/2016 17:31   Results/Tests Pending at Time of Discharge: None  Discharge Medications:    Medication List    TAKE these medications   acetaminophen 500 MG tablet Commonly known as:  TYLENOL Take 1,000 mg by mouth every 6 (six) hours as needed for mild pain or moderate pain.   albuterol 108 (90 Base) MCG/ACT inhaler Commonly known as:  PROVENTIL HFA;VENTOLIN HFA Inhale 2 puffs into the lungs every 4 (four) hours as needed for wheezing or shortness of breath.   allopurinol 100 MG  tablet Commonly known as:  ZYLOPRIM Take 100 mg by mouth once daily on non-dialysis days (Tues/Thurs/Sat/Sun)   Aluminum & Magnesium Hydroxide 600-300 MG/5ML Conc Take 5 mLs by mouth every 6 (six) hours as needed for indigestion.   amLODipine 10 MG tablet Commonly known as:  NORVASC Take 1 tablet (10 mg total) by  mouth at bedtime.   ARIPiprazole 2 MG tablet Commonly known as:  ABILIFY Take 0.5 tablets (1 mg total) by mouth daily.   aspirin EC 81 MG tablet Take 81 mg by mouth every morning.   atorvastatin 40 MG tablet Commonly known as:  LIPITOR Take 1 tablet (40 mg total) by mouth daily.   cinacalcet 30 MG tablet Commonly known as:  SENSIPAR Take 90 mg by mouth at bedtime.   cloNIDine 0.2 MG tablet Commonly known as:  CATAPRES Take 1 tablet (0.2 mg total) by mouth 3 (three) times daily.   DULoxetine 30 MG capsule Commonly known as:  CYMBALTA Take 1 capsule (30 mg total) by mouth 2 (two) times daily. What changed:  when to take this   fluticasone 50 MCG/ACT nasal spray Commonly known as:  FLONASE Place 2 sprays into both nostrils daily as needed for allergies or rhinitis.   ipratropium 17 MCG/ACT inhaler Commonly known as:  ATROVENT HFA Inhale 2 puffs into the lungs every 4 (four) hours as needed for wheezing.   LEG CRAMP RELIEF PO Take 1-2 tablets by mouth as needed (for leg pain).   lidocaine-prilocaine cream Commonly known as:  EMLA Apply 1 application topically 3 (three) times a week. Use before dialysis on MWF   loratadine 10 MG tablet Commonly known as:  CLARITIN Take 1 tablet (10 mg total) by mouth daily as needed for allergies.   losartan 100 MG tablet Commonly known as:  COZAAR Take 1 tablet (100 mg total) by mouth daily.   metoprolol succinate 100 MG 24 hr tablet Commonly known as:  TOPROL-XL Take 100 mg by mouth at bedtime. Take with or immediately following a meal.   metoprolol succinate 50 MG 24 hr tablet Commonly known as:  TOPROL-XL Take 50 mg by mouth at bedtime.   multivitamin Tabs tablet Take 1 tablet by mouth at bedtime. What changed:  when to take this   omeprazole 40 MG capsule Commonly known as:  PRILOSEC Take 1 capsule (40 mg total) by mouth 2 (two) times daily.   pregabalin 100 MG capsule Commonly known as:  LYRICA Take 100 mg by mouth  every evening.       Discharge Instructions: Please refer to Patient Instructions section of EMR for full details.  Patient was counseled important signs and symptoms that should prompt return to medical care, changes in medications, dietary instructions, activity restrictions, and follow up appointments.   Follow-Up Appointments:   Scott Bing, DO 04/21/2016, 8:40 PM PGY-1, [place]

## 2016-04-22 DIAGNOSIS — N2581 Secondary hyperparathyroidism of renal origin: Secondary | ICD-10-CM | POA: Diagnosis not present

## 2016-04-22 DIAGNOSIS — N186 End stage renal disease: Secondary | ICD-10-CM | POA: Diagnosis not present

## 2016-04-22 DIAGNOSIS — D509 Iron deficiency anemia, unspecified: Secondary | ICD-10-CM | POA: Diagnosis not present

## 2016-04-22 DIAGNOSIS — D631 Anemia in chronic kidney disease: Secondary | ICD-10-CM | POA: Diagnosis not present

## 2016-04-25 DIAGNOSIS — N186 End stage renal disease: Secondary | ICD-10-CM | POA: Diagnosis not present

## 2016-04-25 DIAGNOSIS — D509 Iron deficiency anemia, unspecified: Secondary | ICD-10-CM | POA: Diagnosis not present

## 2016-04-25 DIAGNOSIS — D631 Anemia in chronic kidney disease: Secondary | ICD-10-CM | POA: Diagnosis not present

## 2016-04-25 DIAGNOSIS — N2581 Secondary hyperparathyroidism of renal origin: Secondary | ICD-10-CM | POA: Diagnosis not present

## 2016-04-28 DIAGNOSIS — N2581 Secondary hyperparathyroidism of renal origin: Secondary | ICD-10-CM | POA: Diagnosis not present

## 2016-04-28 DIAGNOSIS — D509 Iron deficiency anemia, unspecified: Secondary | ICD-10-CM | POA: Diagnosis not present

## 2016-04-28 DIAGNOSIS — D631 Anemia in chronic kidney disease: Secondary | ICD-10-CM | POA: Diagnosis not present

## 2016-04-28 DIAGNOSIS — N186 End stage renal disease: Secondary | ICD-10-CM | POA: Diagnosis not present

## 2016-04-30 DIAGNOSIS — D509 Iron deficiency anemia, unspecified: Secondary | ICD-10-CM | POA: Diagnosis not present

## 2016-04-30 DIAGNOSIS — D631 Anemia in chronic kidney disease: Secondary | ICD-10-CM | POA: Diagnosis not present

## 2016-04-30 DIAGNOSIS — N2581 Secondary hyperparathyroidism of renal origin: Secondary | ICD-10-CM | POA: Diagnosis not present

## 2016-04-30 DIAGNOSIS — N186 End stage renal disease: Secondary | ICD-10-CM | POA: Diagnosis not present

## 2016-05-01 DIAGNOSIS — N186 End stage renal disease: Secondary | ICD-10-CM | POA: Diagnosis not present

## 2016-05-01 DIAGNOSIS — Z992 Dependence on renal dialysis: Secondary | ICD-10-CM | POA: Diagnosis not present

## 2016-05-01 DIAGNOSIS — E1129 Type 2 diabetes mellitus with other diabetic kidney complication: Secondary | ICD-10-CM | POA: Diagnosis not present

## 2016-05-02 DIAGNOSIS — D631 Anemia in chronic kidney disease: Secondary | ICD-10-CM | POA: Diagnosis not present

## 2016-05-02 DIAGNOSIS — N2581 Secondary hyperparathyroidism of renal origin: Secondary | ICD-10-CM | POA: Diagnosis not present

## 2016-05-02 DIAGNOSIS — N186 End stage renal disease: Secondary | ICD-10-CM | POA: Diagnosis not present

## 2016-05-02 DIAGNOSIS — D509 Iron deficiency anemia, unspecified: Secondary | ICD-10-CM | POA: Diagnosis not present

## 2016-05-05 DIAGNOSIS — N186 End stage renal disease: Secondary | ICD-10-CM | POA: Diagnosis not present

## 2016-05-05 DIAGNOSIS — D631 Anemia in chronic kidney disease: Secondary | ICD-10-CM | POA: Diagnosis not present

## 2016-05-05 DIAGNOSIS — N2581 Secondary hyperparathyroidism of renal origin: Secondary | ICD-10-CM | POA: Diagnosis not present

## 2016-05-05 DIAGNOSIS — D509 Iron deficiency anemia, unspecified: Secondary | ICD-10-CM | POA: Diagnosis not present

## 2016-05-07 DIAGNOSIS — N186 End stage renal disease: Secondary | ICD-10-CM | POA: Diagnosis not present

## 2016-05-07 DIAGNOSIS — N2581 Secondary hyperparathyroidism of renal origin: Secondary | ICD-10-CM | POA: Diagnosis not present

## 2016-05-07 DIAGNOSIS — D631 Anemia in chronic kidney disease: Secondary | ICD-10-CM | POA: Diagnosis not present

## 2016-05-07 DIAGNOSIS — D509 Iron deficiency anemia, unspecified: Secondary | ICD-10-CM | POA: Diagnosis not present

## 2016-05-08 ENCOUNTER — Ambulatory Visit (INDEPENDENT_AMBULATORY_CARE_PROVIDER_SITE_OTHER): Payer: Medicare Other | Admitting: Internal Medicine

## 2016-05-08 ENCOUNTER — Encounter: Payer: Self-pay | Admitting: Internal Medicine

## 2016-05-08 VITALS — BP 168/62 | HR 70 | Temp 97.7°F | Ht 62.0 in | Wt 184.0 lb

## 2016-05-08 DIAGNOSIS — R0981 Nasal congestion: Secondary | ICD-10-CM | POA: Diagnosis not present

## 2016-05-08 DIAGNOSIS — B351 Tinea unguium: Secondary | ICD-10-CM | POA: Diagnosis present

## 2016-05-08 DIAGNOSIS — F411 Generalized anxiety disorder: Secondary | ICD-10-CM | POA: Diagnosis not present

## 2016-05-08 DIAGNOSIS — J329 Chronic sinusitis, unspecified: Secondary | ICD-10-CM | POA: Diagnosis not present

## 2016-05-08 MED ORDER — CICLOPIROX 8 % EX SOLN: Freq: Every day | CUTANEOUS | 0 refills | Status: DC

## 2016-05-08 MED ORDER — FLUTICASONE PROPIONATE 50 MCG/ACT NA SUSP: 2.0000 | Freq: Every day | NASAL | 3 refills | Status: DC | PRN

## 2016-05-08 MED ORDER — LORATADINE 10 MG PO TABS: 10.0000 mg | ORAL_TABLET | Freq: Every day | ORAL | 3 refills | Status: DC | PRN

## 2016-05-08 MED ORDER — ARIPIPRAZOLE 2 MG PO TABS: 1.0000 mg | ORAL_TABLET | Freq: Every day | ORAL | 6 refills | Status: DC

## 2016-05-08 NOTE — Patient Instructions (Signed)
[name],  Thank you for coming in today. I recommend epsom salt soaks and seeing podiatry.   I will call you about antifungal options.  Best, Dr. Will Bonnet Toenail An ingrown toenail occurs when the corner or sides of your toenail grow into the surrounding skin. The big toe is most commonly affected, but it can happen to any of your toes. If your ingrown toenail is not treated, you will be at risk for infection. What are the causes? This condition may be caused by:  Wearing shoes that are too small or tight.  Injury or trauma, such as stubbing your toe or having your toe stepped on.  Improper cutting or care of your toenails.  Being born with (congenital) nail or foot abnormalities, such as having a nail that is too big for your toe. What increases the risk? Risk factors for an ingrown toenail include:  Age. Your nails tend to thicken as you get older, so ingrown nails are more common in older people.  Diabetes.  Cutting your toenails incorrectly.  Blood circulation problems. What are the signs or symptoms? Symptoms may include:  Pain, soreness, or tenderness.  Redness.  Swelling.  Hardening of the skin surrounding the toe. Your ingrown toenail may be infected if there is fluid, pus, or drainage. How is this diagnosed? An ingrown toenail may be diagnosed by medical history and physical exam. If your toenail is infected, your health care provider may test a sample of the drainage. How is this treated? Treatment depends on the severity of your ingrown toenail. Some ingrown toenails may be treated at home. More severe or infected ingrown toenails may require surgery to remove all or part of the nail. Infected ingrown toenails may also be treated with antibiotic medicines. Follow these instructions at home:  If you were prescribed an antibiotic medicine, finish all of it even if you start to feel better.  Soak your foot in warm soapy water for 20 minutes, 3  times per day or as directed by your health care provider.  Carefully lift the edge of the nail away from the sore skin by wedging a small piece of cotton under the corner of the nail. This may help with the pain. Be careful not to cause more injury to the area.  Wear shoes that fit well. If your ingrown toenail is causing you pain, try wearing sandals, if possible.  Trim your toenails regularly and carefully. Do not cut them in a curved shape. Cut your toenails straight across. This prevents injury to the skin at the corners of the toenail.  Keep your feet clean and dry.  If you are having trouble walking and are given crutches by your health care provider, use them as directed.  Do not pick at your toenail or try to remove it yourself.  Take medicines only as directed by your health care provider.  Keep all follow-up visits as directed by your health care provider. This is important. Contact a health care provider if:  Your symptoms do not improve with treatment. Get help right away if:  You have red streaks that start at your foot and go up your leg.  You have a fever.  You have increased redness, swelling, or pain.  You have fluid, blood, or pus coming from your toenail. This information is not intended to replace advice given to you by your health care provider. Make sure you discuss any questions you have with your health care provider. Document Released:  05/16/2000 Document Revised: 10/19/2015 Document Reviewed: 04/12/2014 Elsevier Interactive Patient Education  2017 [employer].

## 2016-05-08 NOTE — Progress Notes (Signed)
[facility] Progress Note  Subjective:  [name] is a 75 y.o. female who presents with concern for toenail infection. She has had toenail removal of both big toes about 1 year ago. The right toenail bed was apparently removed, whereas left toenail bed was not. However, both toenails have subsequently grown back and are very thick and painful. L toenail hurts patient more. She has peripheral nephropathy but has intense shooting pain at times when contact is made with the tip of her R toenail. Even light contact like her sheet touching her nail can be painful. She is also concerned that her L toenail is darker at the tip. She had a lot of pain after last toenail removals and is concerned about infection if she were to repeat the procedure. She denies redness or swelling of feet. She has been soaking her feet in warm water, which helps some. History of ESRD on HD 2/2 cHTN. No diabetes. ROS: No fevers, no recent falls  She also requests refills of her allergy medications and abilify, which she feels has helped a lot with her numerous family stressors.   Chief Complaint  Patient presents with  . Nail Problem    Past Medical History:  Diagnosis Date  . Abnormality of gait 03/28/2015  . Adrenal mass ([place])   . 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 Arthritis   . Back pain   . CHF (congestive heart failure) ([place])   . Chronic kidney disease    Hemo MWF  . Constipation   . COPD (chronic obstructive pulmonary disease) ([place])   . Depression   . Dialysis patient [facility])    kidney  . Diverticulitis   . GERD (gastroesophageal reflux disease)   . H/O hiatal hernia   . High cholesterol   . Hyperlipidemia   . Hypertension   . IBS (irritable bowel syndrome)   . Meralgia paresthetica of right side 12/26/2014  . Normal cardiac stress test 12/24/2009   lexiscan, imaging normal  . Renal disorder   . RLS (restless legs syndrome)   . Seizures ([place])     2004  . Sinus complaint   . Thyroid disease   . TIA (transient ischemic attack)   . Tubular adenoma of colon 01/2008    Objective: Blood pressure (!) 168/62, pulse 70, temperature 97.7 F (36.5 C), temperature source Oral, height 5\' 2"  (1.575 m), weight 184 lb (83.5 kg). Constitutional: Well-appearing older female, in NAD.  Musculoskeletal: No LE edema. Skin: No redness or swelling at edges of nailbed. Thickened nails of bilateral big toes.  Neuro: Decreased sensation across plantar surface of feet bilaterally.  Psychiatric: Normal mood and affect.  Vitals reviewed  Assessment/Plan: Onychomycosis - No evidence of paronychia. Recommended continuing warm soaks and adding epsom salts. - Will refer to podiatry to see if cutting back nails provides pain relief. Would be wary to repeat toenail removal given higher risk of infection due to patient's peripheral neuropathy. - Prescribed ciclopirox solution to try to decrease thickness of nails  Follow-up at earliest convenience to re-address blood pressure. May be elevated today due to pain.  Olene Floss, MD [place], PGY-2

## 2016-05-09 DIAGNOSIS — B351 Tinea unguium: Secondary | ICD-10-CM | POA: Insufficient documentation

## 2016-05-09 DIAGNOSIS — D509 Iron deficiency anemia, unspecified: Secondary | ICD-10-CM | POA: Diagnosis not present

## 2016-05-09 DIAGNOSIS — N2581 Secondary hyperparathyroidism of renal origin: Secondary | ICD-10-CM | POA: Diagnosis not present

## 2016-05-09 DIAGNOSIS — N186 End stage renal disease: Secondary | ICD-10-CM | POA: Diagnosis not present

## 2016-05-09 DIAGNOSIS — D631 Anemia in chronic kidney disease: Secondary | ICD-10-CM | POA: Diagnosis not present

## 2016-05-09 NOTE — Assessment & Plan Note (Signed)
-   No evidence of paronychia. Recommended continuing warm soaks and adding epsom salts. - Will refer to podiatry to see if cutting back nails provides pain relief. Would be wary to repeat toenail removal given higher risk of infection due to patient's peripheral neuropathy. - Prescribed ciclopirox solution to try to decrease thickness of nails

## 2016-05-12 DIAGNOSIS — N2581 Secondary hyperparathyroidism of renal origin: Secondary | ICD-10-CM | POA: Diagnosis not present

## 2016-05-12 DIAGNOSIS — D509 Iron deficiency anemia, unspecified: Secondary | ICD-10-CM | POA: Diagnosis not present

## 2016-05-12 DIAGNOSIS — D631 Anemia in chronic kidney disease: Secondary | ICD-10-CM | POA: Diagnosis not present

## 2016-05-12 DIAGNOSIS — N186 End stage renal disease: Secondary | ICD-10-CM | POA: Diagnosis not present

## 2016-05-15 ENCOUNTER — Ambulatory Visit (INDEPENDENT_AMBULATORY_CARE_PROVIDER_SITE_OTHER): Payer: Medicare Other | Admitting: Neurology

## 2016-05-15 ENCOUNTER — Encounter: Payer: Self-pay | Admitting: Neurology

## 2016-05-15 VITALS — BP 179/69 | HR 71 | Ht 62.0 in | Wt 184.5 lb

## 2016-05-15 DIAGNOSIS — G609 Hereditary and idiopathic neuropathy, unspecified: Secondary | ICD-10-CM

## 2016-05-15 MED ORDER — DULOXETINE HCL 30 MG PO CPEP: 30.0000 mg | ORAL_CAPSULE | Freq: Every day | ORAL | 3 refills | Status: DC

## 2016-05-15 NOTE — Progress Notes (Signed)
Reason for visit: Peripheral neuropathy  [name] is an 75 y.o. female  History of present illness:  [name] is a 75 year old right-handed black female with a history of a peripheral neuropathy and chronic low back pain. The patient was recently in the hospital for chest pain on 04/19/2016. The patient was found to have gastroesophageal reflux disease, she is currently on medication for this. The patient received an epidural steroid injection in August 2017 and she gained excellent benefit with the procedure. The patient is not having any back pain or leg discomfort at this time. The meralgia paresthetica on the right has improved. The patient is sleeping well at night, she has had one fall out of bed when she rolled over, but no falls with walking. The patient is able to walk up to 3 blocks at a time without difficulty. The patient is pleased with her current progress.  Past Medical History:  Diagnosis Date  . Abnormality of gait 03/28/2015  . Adrenal mass ([name])   . 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 Arthritis   . Back pain   . CHF (congestive heart failure) ([place])   . Chronic kidney disease    [place] MWF  . Constipation   . COPD (chronic obstructive pulmonary disease) ([place])   . Depression   . Dialysis patient [facility])    kidney  . Diverticulitis   . GERD (gastroesophageal reflux disease)   . H/O hiatal hernia   . High cholesterol   . Hyperlipidemia   . Hypertension   . IBS (irritable bowel syndrome)   . Meralgia paresthetica of right side 12/26/2014  . Normal cardiac stress test 12/24/2009   lexiscan, imaging normal  . Renal disorder   . RLS (restless legs syndrome)   . Seizures ([place])    2004  . Sinus complaint   . Thyroid disease   . TIA (transient ischemic attack)   . Tubular adenoma of colon 01/2008    Past Surgical History:  Procedure Laterality Date  . ABDOMINAL HYSTERECTOMY    . ANAL RECTAL MANOMETRY N/A 07/25/2015   Procedure: ANO RECTAL MANOMETRY;  Surgeon: Mauri Pole, MD;  Location: [facility];  Service: Endoscopy;  Laterality: N/A;  . APPENDECTOMY    . AV FISTULA PLACEMENT Left 11/11/2012   Procedure: INSERTION OF ARTERIOVENOUS (AV) GORE-TEX GRAFT ARM;  Surgeon: Angelia Mould, MD;  Location: [place];  Service: Vascular;  Laterality: Left;  . [place] REMOVAL Left 11/18/2012   Procedure: REMOVAL OF LEFT UPPER ARM ARTERIOVENOUS GORETEX GRAFT ([place]);  Surgeon: Angelia Mould, MD;  Location: [place];  Service: Vascular;  Laterality: Left;  . CARDIAC CATHETERIZATION  2003   normal  . CHOLECYSTECTOMY     Open mid line incision  . frontal cranialcity  2002  . frontal craniotomy  2002   indication = sinusitis  . INSERTION OF DIALYSIS CATHETER     Left  . PATCH ANGIOPLASTY Left 11/18/2012   Procedure: PATCH ANGIOPLASTY;  Surgeon: Angelia Mould, MD;  Location: [place];  Service: Vascular;  Laterality: Left;  . PERIPHERAL VASCULAR CATHETERIZATION Left 03/08/2015   Procedure: A/V Shuntogram/Fistulagram;  Surgeon: Algernon Huxley, MD;  Location: [place] CV LAB;  Service: Cardiovascular;  Laterality: Left;  . PERIPHERAL VASCULAR CATHETERIZATION Left 03/08/2015   Procedure: A/V Shunt Intervention;  Surgeon: Algernon Huxley, MD;  Location: [place] [facility];  Service: Cardiovascular;  Laterality: Left;  .  RECTAL ULTRASOUND N/A 10/05/2015   Procedure: ANAL ULTRASOUND WITH PROBE;  Surgeon: Leighton Ruff, MD;  Location: [facility];  Service: Endoscopy;  Laterality: N/A;  . REVISON OF ARTERIOVENOUS FISTULA  05/11/2012   Procedure: REVISON OF ARTERIOVENOUS FISTULA;  Surgeon: Elam Dutch, MD;  Location: [place];  Service: Vascular;  Laterality: Right;  . TUBAL LIGATION      Family History  Problem Relation Age of Onset  . Thyroid cancer Mother   . Heart disease Father   . Hypertension Father   . Heart attack Father   . Lupus Daughter   . Breast cancer Sister   . Thyroid cancer Sister      Social history:  reports that she has been smoking Cigarettes.  She started smoking about 22 months ago. She has a 60.00 pack-year smoking history. She has never used smokeless tobacco. She reports that she does not drink alcohol or use drugs.    Allergies  Allergen Reactions  . Tuberculin Tests Hives    "blisters"  . Valacyclovir Other (See Comments)    Confusion and nervousness  . Codeine Nausea And Vomiting  . Penicillins Rash    No problems breathing. Has tolerated omnicef in past without issue Has patient had a PCN reaction causing immediate rash, facial/tongue/throat swelling, SOB or lightheadedness with hypotension: Yes Has patient had a PCN reaction causing severe rash involving mucus membranes or skin necrosis: No Has patient had a PCN reaction that required hospitalization No Has patient had a PCN reaction occurring within the last 10 years: No If all of the above answers are "NO", then may proceed with Cephalosporin  . Sulfamethoxazole Rash    Medications:  Prior to Admission medications   Medication Sig Start Date End Date Taking? Authorizing Provider  acetaminophen (TYLENOL) 500 MG tablet Take 1,000 mg by mouth every 6 (six) hours as needed for mild pain or moderate pain.   Yes Historical Provider, MD  albuterol (PROVENTIL HFA;VENTOLIN HFA) 108 (90 Base) MCG/ACT inhaler Inhale 2 puffs into the lungs every 4 (four) hours as needed for wheezing or shortness of breath. 06/05/15  Yes Ashly Windell Moulding, DO  allopurinol (ZYLOPRIM) 100 MG tablet Take 100 mg by mouth once daily on non-dialysis days (Tues/Thurs/Sat/Sun) 11/13/15  Yes Smiley Houseman, MD  Aluminum & Magnesium Hydroxide 600-300 MG/5ML CONC Take 5 mLs by mouth every 6 (six) hours as needed for indigestion. 11/02/15  Yes Asiyah Cletis Media, MD  amLODipine (NORVASC) 10 MG tablet Take 1 tablet (10 mg total) by mouth at bedtime. 11/13/15  Yes Smiley Houseman, MD  ARIPiprazole (ABILIFY) 2 MG tablet Take 0.5 tablets  (1 mg total) by mouth daily. 05/08/16  Yes Hillary Corinda Gubler, MD  aspirin EC 81 MG tablet Take 81 mg by mouth every morning.   Yes Historical Provider, MD  atorvastatin (LIPITOR) 40 MG tablet Take 1 tablet (40 mg total) by mouth daily. 02/26/15  Yes Center N Rumley, DO  cloNIDine (CATAPRES) 0.2 MG tablet Take 1 tablet (0.2 mg total) by mouth 3 (three) times daily. 11/13/15  Yes Smiley Houseman, MD  DULoxetine (CYMBALTA) 30 MG capsule Take 1 capsule (30 mg total) by mouth daily. 05/15/16  Yes Kathrynn Ducking, MD  fluticasone [facility]) 50 MCG/ACT nasal spray Place 2 sprays into both nostrils daily as needed for allergies or rhinitis. 05/08/16  Yes Hillary Corinda Gubler, MD  Homeopathic Products (LEG CRAMP RELIEF PO) Take 1-2 tablets by mouth as needed (for leg pain).  Yes Historical Provider, MD  ipratropium (ATROVENT HFA) 17 MCG/ACT inhaler Inhale 2 puffs into the lungs every 4 (four) hours as needed for wheezing. 06/05/15  Yes Ashly Windell Moulding, DO  lidocaine-prilocaine (EMLA) cream Apply 1 application topically 3 (three) times a week. Use before dialysis on MWF   Yes Historical Provider, MD  loratadine (CLARITIN) 10 MG tablet Take 1 tablet (10 mg total) by mouth daily as needed for allergies. 05/08/16  Yes Hillary Corinda Gubler, MD  losartan (COZAAR) 100 MG tablet Take 1 tablet (100 mg total) by mouth daily. 11/13/15  Yes Smiley Houseman, MD  metoprolol succinate (TOPROL-XL) 100 MG 24 hr tablet Take 100 mg by mouth at bedtime. Take with or immediately following a meal.   Yes Historical Provider, MD  metoprolol succinate (TOPROL-XL) 50 MG 24 hr tablet Take 50 mg by mouth at bedtime. 10/15/15  Yes Historical Provider, MD  multivitamin (RENA-VIT) TABS tablet Take 1 tablet by mouth at bedtime. Patient taking differently: Take 1 tablet by mouth every morning.  01/01/15  Yes Alyssa A Lincoln Brigham, MD  omeprazole (PRILOSEC) 40 MG capsule Take 1 capsule (40 mg total) by mouth 2 (two) times daily. 12/27/15   Yes Ladene Artist, MD  pregabalin (LYRICA) 100 MG capsule Take 100 mg by mouth every evening.    Yes Historical Provider, MD  RENVELA 800 MG tablet  02/13/16  Yes Historical Provider, MD  SENSIPAR 90 MG tablet  05/01/16  Yes Historical Provider, MD    ROS:  Out of a complete 14 system review of symptoms, the patient complains only of the following symptoms, and all other reviewed systems are negative.  Urine decrease  Blood pressure (!) 179/69, pulse 71, height 5\' 2"  (1.575 m), weight 184 lb 8 oz (83.7 kg).  Physical Exam  General: The patient is alert and cooperative at the time of the examination. The patient is moderately obese.  Neuromuscular: The patient is able to flex the low back to about 110, no discomfort is noted with rotation or extension of the back.  Skin: No significant peripheral edema is noted.   Neurologic Exam  Mental status: The patient is alert and oriented x 3 at the time of the examination. The patient has apparent normal recent and remote memory, with an apparently normal attention span and concentration ability.   Cranial nerves: Facial symmetry is present. Speech is normal, no aphasia or dysarthria is noted. Extraocular movements are full. Visual fields are full.  Motor: The patient has good strength in all 4 extremities.  Sensory examination: Soft touch sensation is symmetric on the face, arms, and legs.  Coordination: The patient has good finger-nose-finger and heel-to-shin bilaterally.  Gait and station: The patient has a normal gait. Tandem gait is normal. Romberg is negative. No drift is seen.  Reflexes: Deep tendon reflexes are symmetric, but are depressed.   Assessment/Plan:  1. Peripheral neuropathy  2. Chronic low back pain  The patient has done well with the back pain following epidural steroid injection. This can be repeated in the future if needed. The patient will remain on Cymbalta, a prescription will be called in today. The  patient will follow-up in 6 months, sooner if needed.  Jill Alexanders MD 05/15/2016 9:48 AM  [facility] [address] [place], [place] [address]  Phone [phone] Fax [phone]

## 2016-05-16 DIAGNOSIS — N2581 Secondary hyperparathyroidism of renal origin: Secondary | ICD-10-CM | POA: Diagnosis not present

## 2016-05-16 DIAGNOSIS — N186 End stage renal disease: Secondary | ICD-10-CM | POA: Diagnosis not present

## 2016-05-16 DIAGNOSIS — D631 Anemia in chronic kidney disease: Secondary | ICD-10-CM | POA: Diagnosis not present

## 2016-05-16 DIAGNOSIS — D509 Iron deficiency anemia, unspecified: Secondary | ICD-10-CM | POA: Diagnosis not present

## 2016-05-19 DIAGNOSIS — D509 Iron deficiency anemia, unspecified: Secondary | ICD-10-CM | POA: Diagnosis not present

## 2016-05-19 DIAGNOSIS — D631 Anemia in chronic kidney disease: Secondary | ICD-10-CM | POA: Diagnosis not present

## 2016-05-19 DIAGNOSIS — N186 End stage renal disease: Secondary | ICD-10-CM | POA: Diagnosis not present

## 2016-05-19 DIAGNOSIS — N2581 Secondary hyperparathyroidism of renal origin: Secondary | ICD-10-CM | POA: Diagnosis not present

## 2016-05-21 DIAGNOSIS — N2581 Secondary hyperparathyroidism of renal origin: Secondary | ICD-10-CM | POA: Diagnosis not present

## 2016-05-21 DIAGNOSIS — D631 Anemia in chronic kidney disease: Secondary | ICD-10-CM | POA: Diagnosis not present

## 2016-05-21 DIAGNOSIS — D509 Iron deficiency anemia, unspecified: Secondary | ICD-10-CM | POA: Diagnosis not present

## 2016-05-21 DIAGNOSIS — N186 End stage renal disease: Secondary | ICD-10-CM | POA: Diagnosis not present

## 2016-05-23 DIAGNOSIS — N186 End stage renal disease: Secondary | ICD-10-CM | POA: Diagnosis not present

## 2016-05-23 DIAGNOSIS — D631 Anemia in chronic kidney disease: Secondary | ICD-10-CM | POA: Diagnosis not present

## 2016-05-23 DIAGNOSIS — D509 Iron deficiency anemia, unspecified: Secondary | ICD-10-CM | POA: Diagnosis not present

## 2016-05-23 DIAGNOSIS — N2581 Secondary hyperparathyroidism of renal origin: Secondary | ICD-10-CM | POA: Diagnosis not present

## 2016-05-25 DIAGNOSIS — N186 End stage renal disease: Secondary | ICD-10-CM | POA: Diagnosis not present

## 2016-05-25 DIAGNOSIS — D631 Anemia in chronic kidney disease: Secondary | ICD-10-CM | POA: Diagnosis not present

## 2016-05-25 DIAGNOSIS — D509 Iron deficiency anemia, unspecified: Secondary | ICD-10-CM | POA: Diagnosis not present

## 2016-05-25 DIAGNOSIS — N2581 Secondary hyperparathyroidism of renal origin: Secondary | ICD-10-CM | POA: Diagnosis not present

## 2016-05-28 DIAGNOSIS — N2581 Secondary hyperparathyroidism of renal origin: Secondary | ICD-10-CM | POA: Diagnosis not present

## 2016-05-28 DIAGNOSIS — N186 End stage renal disease: Secondary | ICD-10-CM | POA: Diagnosis not present

## 2016-05-28 DIAGNOSIS — D509 Iron deficiency anemia, unspecified: Secondary | ICD-10-CM | POA: Diagnosis not present

## 2016-05-28 DIAGNOSIS — D631 Anemia in chronic kidney disease: Secondary | ICD-10-CM | POA: Diagnosis not present

## 2016-05-30 DIAGNOSIS — D509 Iron deficiency anemia, unspecified: Secondary | ICD-10-CM | POA: Diagnosis not present

## 2016-05-30 DIAGNOSIS — N2581 Secondary hyperparathyroidism of renal origin: Secondary | ICD-10-CM | POA: Diagnosis not present

## 2016-05-30 DIAGNOSIS — N186 End stage renal disease: Secondary | ICD-10-CM | POA: Diagnosis not present

## 2016-05-30 DIAGNOSIS — D631 Anemia in chronic kidney disease: Secondary | ICD-10-CM | POA: Diagnosis not present

## 2016-06-01 DIAGNOSIS — N186 End stage renal disease: Secondary | ICD-10-CM | POA: Diagnosis not present

## 2016-06-01 DIAGNOSIS — E1129 Type 2 diabetes mellitus with other diabetic kidney complication: Secondary | ICD-10-CM | POA: Diagnosis not present

## 2016-06-01 DIAGNOSIS — Z992 Dependence on renal dialysis: Secondary | ICD-10-CM | POA: Diagnosis not present

## 2016-06-03 ENCOUNTER — Ambulatory Visit (INDEPENDENT_AMBULATORY_CARE_PROVIDER_SITE_OTHER): Payer: Medicare Other | Admitting: Sports Medicine

## 2016-06-03 ENCOUNTER — Encounter: Payer: Self-pay | Admitting: Sports Medicine

## 2016-06-03 DIAGNOSIS — L603 Nail dystrophy: Secondary | ICD-10-CM | POA: Diagnosis not present

## 2016-06-03 DIAGNOSIS — M79675 Pain in left toe(s): Secondary | ICD-10-CM

## 2016-06-03 DIAGNOSIS — M79674 Pain in right toe(s): Secondary | ICD-10-CM | POA: Diagnosis not present

## 2016-06-03 NOTE — Progress Notes (Signed)
Subjective: [name] is a 76 y.o. female patient seen today in office with complaint of painful thickened Left and Right big toe nails report that she had them removed about 1 year ago by PCP but came back and wants to discuss options. Patient has no other pedal complaints at this time.   Patient Active Problem List   Diagnosis Date Noted  . Onychomycosis 05/09/2016  . Nonspecific chest pain 04/19/2016  . ACS (acute coronary syndrome) ([place]) 04/19/2016  . Leg pain, bilateral 12/05/2015  . Trouble in sleeping 11/22/2015  . End stage renal disease ([place])   . Hyperkalemia 11/12/2015  . Shortness of breath 11/12/2015  . ESRD on dialysis ([place])   . Dyspnea   . Chronic obstructive pulmonary disease ([place])   . Hypertensive urgency 09/20/2015  . Fecal incontinence   . Adverse effects of medication 06/21/2015  . Herpes 06/19/2015  . Skin lesion 06/16/2015  . Genital ulcer, female 06/16/2015  . Onychocryptosis 04/08/2015  . Renal mass   . End stage renal disease on dialysis ([place])   . Inadequate pain control 12/30/2014  . CHF (congestive heart failure) ([place]) 12/30/2014  . Meralgia paresthetica of right side 12/26/2014  . Hereditary and idiopathic peripheral neuropathy 02/15/2013  . Dermatitis of face 08/19/2012  . HIP PAIN, BILATERAL 12/07/2008  . BLADDER PROLAPSE 11/17/2007  . OSTEOARTHRITIS, GENERALIZED, MULTIPLE JOINTS 08/18/2007  . 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 ANXIETY STATE, UNSPECIFIED 04/23/2007  . ANEMIA NEC 03/31/2007  . TOBACCO ABUSE 12/10/2006  . HIATAL HERNIA 12/10/2006  . Dysphagia, unspecified(787.20) 12/10/2006  . HYPERCHOLESTEROLEMIA 07/30/2006  . Gout, unspecified 07/30/2006  . DEPRESSIVE DISORDER, NOS 07/30/2006  . HYPERTENSION, BENIGN SYSTEMIC 07/30/2006  . GASTROESOPHAGEAL REFLUX, NO ESOPHAGITIS 07/30/2006    Current Outpatient Prescriptions on File Prior to Visit  Medication Sig Dispense Refill  . acetaminophen (TYLENOL) 500 MG  tablet Take 1,000 mg by mouth every 6 (six) hours as needed for mild pain or moderate pain.    Marland Kitchen albuterol (PROVENTIL HFA;VENTOLIN HFA) 108 (90 Base) MCG/ACT inhaler Inhale 2 puffs into the lungs every 4 (four) hours as needed for wheezing or shortness of breath. 1 Inhaler 1  . allopurinol (ZYLOPRIM) 100 MG tablet Take 100 mg by mouth once daily on non-dialysis days (Tues/Thurs/Sat/Sun) 30 tablet 0  . Aluminum & Magnesium Hydroxide 600-300 MG/5ML CONC Take 5 mLs by mouth every 6 (six) hours as needed for indigestion. 150 mL 0  . amLODipine (NORVASC) 10 MG tablet Take 1 tablet (10 mg total) by mouth at bedtime. 30 tablet 0  . ARIPiprazole (ABILIFY) 2 MG tablet Take 0.5 tablets (1 mg total) by mouth daily. 30 tablet 6  . aspirin EC 81 MG tablet Take 81 mg by mouth every morning.    Marland Kitchen atorvastatin (LIPITOR) 40 MG tablet Take 1 tablet (40 mg total) by mouth daily. 90 tablet 3  . cloNIDine (CATAPRES) 0.2 MG tablet Take 1 tablet (0.2 mg total) by mouth 3 (three) times daily. 90 tablet 0  . DULoxetine (CYMBALTA) 30 MG capsule Take 1 capsule (30 mg total) by mouth daily. 90 capsule 3  . fluticasone (FLONASE) 50 MCG/ACT nasal spray Place 2 sprays into both nostrils daily as needed for allergies or rhinitis. 16 g 3  . Homeopathic Products (LEG CRAMP RELIEF PO) Take 1-2 tablets by mouth as needed (for leg pain).    Marland Kitchen ipratropium (ATROVENT HFA) 17 MCG/ACT inhaler Inhale 2 puffs into the lungs every 4 (four) hours as needed for wheezing.  1 Inhaler 12  . lidocaine-prilocaine (EMLA) cream Apply 1 application topically 3 (three) times a week. Use before dialysis on MWF    . loratadine (CLARITIN) 10 MG tablet Take 1 tablet (10 mg total) by mouth daily as needed for allergies. 90 tablet 3  . losartan (COZAAR) 100 MG tablet Take 1 tablet (100 mg total) by mouth daily. 30 tablet 0  . metoprolol succinate (TOPROL-XL) 100 MG 24 hr tablet Take 100 mg by mouth at bedtime. Take with or immediately following a meal.    .  metoprolol succinate (TOPROL-XL) 50 MG 24 hr tablet Take 50 mg by mouth at bedtime.    . multivitamin (RENA-VIT) TABS tablet Take 1 tablet by mouth at bedtime. (Patient taking differently: Take 1 tablet by mouth every morning. ) 60 tablet 5  . omeprazole (PRILOSEC) 40 MG capsule Take 1 capsule (40 mg total) by mouth 2 (two) times daily. 60 capsule 11  . pregabalin (LYRICA) 100 MG capsule Take 100 mg by mouth every evening.     Marland Kitchen RENVELA 800 MG tablet     . SENSIPAR 90 MG tablet      No current facility-administered medications on file prior to visit.     Allergies  Allergen Reactions  . Tuberculin Tests Hives    "blisters"  . Valacyclovir Other (See Comments)    Confusion and nervousness  . Codeine Nausea And Vomiting  . Penicillins Rash    No problems breathing. Has tolerated omnicef in past without issue Has patient had a PCN reaction causing immediate rash, facial/tongue/throat swelling, SOB or lightheadedness with hypotension: Yes Has patient had a PCN reaction causing severe rash involving mucus membranes or skin necrosis: No Has patient had a PCN reaction that required hospitalization No Has patient had a PCN reaction occurring within the last 10 years: No If all of the above answers are "NO", then may proceed with Cephalosporin  . Sulfamethoxazole Rash    Objective: Physical Exam  General: Well developed, nourished, no acute distress, awake, alert and oriented x 3  Vascular: Dorsalis pedis artery 1/4 bilateral, Posterior tibial artery 1/4 bilateral, skin temperature warm to warm proximal to distal bilateral lower extremities, no varicosities, scant pedal hair present bilateral.  Neurological: Gross sensation present via light touch bilateral.   Dermatological: Skin is warm, dry, and supple bilateral, Bilateral hallux nails are tender, extremely thick, and discolored with moderate subungal debris, no webspace macerations present bilateral, no open lesions present bilateral,  no callus/corns/hyperkeratotic tissue present bilateral. No signs of infection bilateral.  Musculoskeletal: No symptomatic boney deformities noted bilateral. Muscular strength within normal limits without painon range of motion. No pain with calf compression bilateral.  Assessment and Plan:  Problem List Items Addressed This Visit    None    Visit Diagnoses    Onychodystrophy    -  Primary   Toe pain, bilateral         -Examined patient.  -Discussed treatment options for painful mycotic nails. -Patient desires to have permanent nail removal procedure but want to schedule it. -Patient to return when ready for bilateral hallux PNAs or sooner if symptoms worsen.  Landis Martins, DPM

## 2016-06-04 DIAGNOSIS — D631 Anemia in chronic kidney disease: Secondary | ICD-10-CM | POA: Diagnosis not present

## 2016-06-04 DIAGNOSIS — N186 End stage renal disease: Secondary | ICD-10-CM | POA: Diagnosis not present

## 2016-06-04 DIAGNOSIS — D509 Iron deficiency anemia, unspecified: Secondary | ICD-10-CM | POA: Diagnosis not present

## 2016-06-04 DIAGNOSIS — N2581 Secondary hyperparathyroidism of renal origin: Secondary | ICD-10-CM | POA: Diagnosis not present

## 2016-06-06 DIAGNOSIS — N2581 Secondary hyperparathyroidism of renal origin: Secondary | ICD-10-CM | POA: Diagnosis not present

## 2016-06-06 DIAGNOSIS — N186 End stage renal disease: Secondary | ICD-10-CM | POA: Diagnosis not present

## 2016-06-06 DIAGNOSIS — D509 Iron deficiency anemia, unspecified: Secondary | ICD-10-CM | POA: Diagnosis not present

## 2016-06-06 DIAGNOSIS — D631 Anemia in chronic kidney disease: Secondary | ICD-10-CM | POA: Diagnosis not present

## 2016-06-09 DIAGNOSIS — N2581 Secondary hyperparathyroidism of renal origin: Secondary | ICD-10-CM | POA: Diagnosis not present

## 2016-06-09 DIAGNOSIS — D509 Iron deficiency anemia, unspecified: Secondary | ICD-10-CM | POA: Diagnosis not present

## 2016-06-09 DIAGNOSIS — N186 End stage renal disease: Secondary | ICD-10-CM | POA: Diagnosis not present

## 2016-06-09 DIAGNOSIS — D631 Anemia in chronic kidney disease: Secondary | ICD-10-CM | POA: Diagnosis not present

## 2016-06-10 ENCOUNTER — Encounter: Payer: Self-pay | Admitting: Sports Medicine

## 2016-06-10 ENCOUNTER — Ambulatory Visit (INDEPENDENT_AMBULATORY_CARE_PROVIDER_SITE_OTHER): Payer: Medicare Other | Admitting: Sports Medicine

## 2016-06-10 DIAGNOSIS — M79674 Pain in right toe(s): Secondary | ICD-10-CM

## 2016-06-10 DIAGNOSIS — L603 Nail dystrophy: Secondary | ICD-10-CM

## 2016-06-10 DIAGNOSIS — L600 Ingrowing nail: Secondary | ICD-10-CM | POA: Diagnosis not present

## 2016-06-10 DIAGNOSIS — M79675 Pain in left toe(s): Secondary | ICD-10-CM

## 2016-06-10 NOTE — Patient Instructions (Signed)

## 2016-06-10 NOTE — Progress Notes (Signed)
Subjective: [name] is a 76 y.o. female patient seen today in office with complaint of painful thickened Left and Right big toe nails report that she is now ready to have nail procedures done on both big toes as discussed last week. Patient has no other pedal complaints at this time.   Patient Active Problem List   Diagnosis Date Noted  . Onychomycosis 05/09/2016  . Nonspecific chest pain 04/19/2016  . ACS (acute coronary syndrome) ([place]) 04/19/2016  . Leg pain, bilateral 12/05/2015  . Trouble in sleeping 11/22/2015  . End stage renal disease ([place])   . Hyperkalemia 11/12/2015  . Shortness of breath 11/12/2015  . ESRD on dialysis ([place])   . Dyspnea   . Chronic obstructive pulmonary disease ([place])   . Hypertensive urgency 09/20/2015  . Fecal incontinence   . Adverse effects of medication 06/21/2015  . Herpes 06/19/2015  . Skin lesion 06/16/2015  . Genital ulcer, female 06/16/2015  . Onychocryptosis 04/08/2015  . Renal mass   . End stage renal disease on dialysis ([place])   . Inadequate pain control 12/30/2014  . CHF (congestive heart failure) ([place]) 12/30/2014  . Meralgia paresthetica of right side 12/26/2014  . Hereditary and idiopathic peripheral neuropathy 02/15/2013  . Dermatitis of face 08/19/2012  . HIP PAIN, BILATERAL 12/07/2008  . BLADDER PROLAPSE 11/17/2007  . OSTEOARTHRITIS, GENERALIZED, MULTIPLE JOINTS 08/18/2007  . 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 ANXIETY STATE, UNSPECIFIED 04/23/2007  . ANEMIA NEC 03/31/2007  . TOBACCO ABUSE 12/10/2006  . HIATAL HERNIA 12/10/2006  . Dysphagia, unspecified(787.20) 12/10/2006  . HYPERCHOLESTEROLEMIA 07/30/2006  . Gout, unspecified 07/30/2006  . DEPRESSIVE DISORDER, NOS 07/30/2006  . HYPERTENSION, BENIGN SYSTEMIC 07/30/2006  . GASTROESOPHAGEAL REFLUX, NO ESOPHAGITIS 07/30/2006    Current Outpatient Prescriptions on File Prior to Visit  Medication Sig Dispense Refill  . acetaminophen (TYLENOL) 500 MG  tablet Take 1,000 mg by mouth every 6 (six) hours as needed for mild pain or moderate pain.    Marland Kitchen albuterol (PROVENTIL HFA;VENTOLIN HFA) 108 (90 Base) MCG/ACT inhaler Inhale 2 puffs into the lungs every 4 (four) hours as needed for wheezing or shortness of breath. 1 Inhaler 1  . allopurinol (ZYLOPRIM) 100 MG tablet Take 100 mg by mouth once daily on non-dialysis days (Tues/Thurs/Sat/Sun) 30 tablet 0  . Aluminum & Magnesium Hydroxide 600-300 MG/5ML CONC Take 5 mLs by mouth every 6 (six) hours as needed for indigestion. 150 mL 0  . amLODipine (NORVASC) 10 MG tablet Take 1 tablet (10 mg total) by mouth at bedtime. 30 tablet 0  . ARIPiprazole (ABILIFY) 2 MG tablet Take 0.5 tablets (1 mg total) by mouth daily. 30 tablet 6  . aspirin EC 81 MG tablet Take 81 mg by mouth every morning.    Marland Kitchen atorvastatin (LIPITOR) 40 MG tablet Take 1 tablet (40 mg total) by mouth daily. 90 tablet 3  . cloNIDine (CATAPRES) 0.2 MG tablet Take 1 tablet (0.2 mg total) by mouth 3 (three) times daily. 90 tablet 0  . DULoxetine (CYMBALTA) 30 MG capsule Take 1 capsule (30 mg total) by mouth daily. 90 capsule 3  . fluticasone (FLONASE) 50 MCG/ACT nasal spray Place 2 sprays into both nostrils daily as needed for allergies or rhinitis. 16 g 3  . Homeopathic Products (LEG CRAMP RELIEF PO) Take 1-2 tablets by mouth as needed (for leg pain).    Marland Kitchen ipratropium (ATROVENT HFA) 17 MCG/ACT inhaler Inhale 2 puffs into the lungs every 4 (four) hours as needed for wheezing. 1  Inhaler 12  . lidocaine-prilocaine (EMLA) cream Apply 1 application topically 3 (three) times a week. Use before dialysis on MWF    . loratadine (CLARITIN) 10 MG tablet Take 1 tablet (10 mg total) by mouth daily as needed for allergies. 90 tablet 3  . losartan (COZAAR) 100 MG tablet Take 1 tablet (100 mg total) by mouth daily. 30 tablet 0  . metoprolol succinate (TOPROL-XL) 100 MG 24 hr tablet Take 100 mg by mouth at bedtime. Take with or immediately following a meal.    .  metoprolol succinate (TOPROL-XL) 50 MG 24 hr tablet Take 50 mg by mouth at bedtime.    . multivitamin (RENA-VIT) TABS tablet Take 1 tablet by mouth at bedtime. (Patient taking differently: Take 1 tablet by mouth every morning. ) 60 tablet 5  . omeprazole (PRILOSEC) 40 MG capsule Take 1 capsule (40 mg total) by mouth 2 (two) times daily. 60 capsule 11  . pregabalin (LYRICA) 100 MG capsule Take 100 mg by mouth every evening.     Marland Kitchen RENVELA 800 MG tablet     . SENSIPAR 90 MG tablet      No current facility-administered medications on file prior to visit.     Allergies  Allergen Reactions  . Tuberculin Tests Hives    "blisters"  . Valacyclovir Other (See Comments)    Confusion and nervousness  . Codeine Nausea And Vomiting  . Penicillins Rash    No problems breathing. Has tolerated omnicef in past without issue Has patient had a PCN reaction causing immediate rash, facial/tongue/throat swelling, SOB or lightheadedness with hypotension: Yes Has patient had a PCN reaction causing severe rash involving mucus membranes or skin necrosis: No Has patient had a PCN reaction that required hospitalization No Has patient had a PCN reaction occurring within the last 10 years: No If all of the above answers are "NO", then may proceed with Cephalosporin  . Sulfamethoxazole Rash    Objective: Physical Exam  General: Well developed, nourished, no acute distress, awake, alert and oriented x 3, + Smoker  Vascular: Dorsalis pedis artery 1/4 bilateral, Posterior tibial artery 1/4 bilateral, skin temperature warm to warm proximal to distal bilateral lower extremities, no varicosities, scant pedal hair present bilateral.  Neurological: Gross sensation present via light touch bilateral.   Dermatological: Skin is warm, dry, and supple bilateral, Bilateral hallux nails are tender, extremely thick, and discolored with moderate subungal debris, no webspace macerations present bilateral, no open lesions present  bilateral, no callus/corns/hyperkeratotic tissue present bilateral. No signs of infection bilateral.  Musculoskeletal: No symptomatic boney deformities noted bilateral. Muscular strength within normal limits without painon range of motion. No pain with calf compression bilateral.  Assessment and Plan:  Problem List Items Addressed This Visit    None    Visit Diagnoses    Onychodystrophy    -  Primary   Ingrown nail       Toe pain, bilateral         -Examined patient.  -Discussed treatment options for painful mycotic nails. -Discussed treatment alternatives and plan of care; Explained permanent/temporary nail avulsion and post procedure course to patient. - After a verbal consent, injected 3 ml of a 50:50 mixture of 2% plain  lidocaine and 0.5% plain marcaine in a normal hallux block fashion. Next, a  betadine prep was performed. Anesthesia was tested and found to be appropriate. The right hallux and left hallux nails were then incised from the hyponychium to the epinychium and removed completely.  The area was curretted for any remaining nail or spicules. Phenol application performed and the area was then flushed with alcohol and dressed with antibiotic cream and a dry sterile dressing. -Patient was instructed to leave the dressing intact for today and begin soaking in a weak solution of betadine and water tomorrow. Patient was instructed to soak for 15 minutes each day and apply neosporin and a gauze or bandaid dressing each day. -Patient was instructed to monitor the toes for signs of infection and return to office if toes becomes red, hot or swollen. -Patient to return in 2 weeks for nail check or sooner if symptoms worsen.  Landis Martins, DPM

## 2016-06-11 DIAGNOSIS — N2581 Secondary hyperparathyroidism of renal origin: Secondary | ICD-10-CM | POA: Diagnosis not present

## 2016-06-11 DIAGNOSIS — N186 End stage renal disease: Secondary | ICD-10-CM | POA: Diagnosis not present

## 2016-06-11 DIAGNOSIS — D631 Anemia in chronic kidney disease: Secondary | ICD-10-CM | POA: Diagnosis not present

## 2016-06-11 DIAGNOSIS — D509 Iron deficiency anemia, unspecified: Secondary | ICD-10-CM | POA: Diagnosis not present

## 2016-06-12 ENCOUNTER — Telehealth: Payer: Self-pay | Admitting: Podiatry

## 2016-06-12 NOTE — Telephone Encounter (Signed)
Thanks for letting me know -Dr. Doctor Sheahan  

## 2016-06-12 NOTE — Telephone Encounter (Signed)
Patient called stating that one of her toenails after having toenail procedure was very painful and she was unable to sleep due to the pain. She had oxycodone at home and she took this however did not help with her pain. She has been soaking in Epson salts and using antibiotic ointment and a bandage. She has also iced the area that any significant improvement in symptoms. She denies any redness or warmth. She has noticed a small amount of clear drainage. Upon discussion it sounds like the bandage that she is applying may be too tight. I recommended her to soak in Epsom salts and she can apply Neosporin but if she is sitting at home she can leave uncovered to see if this will help. If symptoms are not improved in the morning I recommended her to call the office for follow-up. She denies any fevers, chills, nausea, vomiting. She has no other complaints.

## 2016-06-13 ENCOUNTER — Telehealth: Payer: Self-pay | Admitting: *Deleted

## 2016-06-13 DIAGNOSIS — D631 Anemia in chronic kidney disease: Secondary | ICD-10-CM | POA: Diagnosis not present

## 2016-06-13 DIAGNOSIS — N186 End stage renal disease: Secondary | ICD-10-CM | POA: Diagnosis not present

## 2016-06-13 DIAGNOSIS — D509 Iron deficiency anemia, unspecified: Secondary | ICD-10-CM | POA: Diagnosis not present

## 2016-06-13 DIAGNOSIS — N2581 Secondary hyperparathyroidism of renal origin: Secondary | ICD-10-CM | POA: Diagnosis not present

## 2016-06-13 MED ORDER — TRAMADOL HCL 50 MG PO TABS: 50.0000 mg | ORAL_TABLET | Freq: Three times a day (TID) | ORAL | 0 refills | Status: DC | PRN

## 2016-06-13 NOTE — Telephone Encounter (Signed)
Pt states she is in terrible pain. Left message informing pt I couldn't help her if she would not answer the phone, that I would try again in 5 minutes. I spoke with pt and she said she is having terrible pain from the toenail removal and is doing the epsom salt soaks and elevating as instructed by Dr. Jacqualyn Posey. I told pt to continue that and Dr. Jacqualyn Posey had ordered Tramadol and to take Ibuprofen in between the dosing of Tramadol, take the Ibuprofen only if she was able to tolerate. Pt states she has been taking tramadol and ibuprofen with out relief. I told pt that any other medication she would have to pick up the rx in the [place] office and take to the pharmacy. I told the pt I would call in the Tramadol Dr. Jacqualyn Posey ordered, to [facility].

## 2016-06-16 DIAGNOSIS — N186 End stage renal disease: Secondary | ICD-10-CM | POA: Diagnosis not present

## 2016-06-16 DIAGNOSIS — D631 Anemia in chronic kidney disease: Secondary | ICD-10-CM | POA: Diagnosis not present

## 2016-06-16 DIAGNOSIS — N2581 Secondary hyperparathyroidism of renal origin: Secondary | ICD-10-CM | POA: Diagnosis not present

## 2016-06-16 DIAGNOSIS — D509 Iron deficiency anemia, unspecified: Secondary | ICD-10-CM | POA: Diagnosis not present

## 2016-06-18 DIAGNOSIS — N2581 Secondary hyperparathyroidism of renal origin: Secondary | ICD-10-CM | POA: Diagnosis not present

## 2016-06-18 DIAGNOSIS — D631 Anemia in chronic kidney disease: Secondary | ICD-10-CM | POA: Diagnosis not present

## 2016-06-18 DIAGNOSIS — D509 Iron deficiency anemia, unspecified: Secondary | ICD-10-CM | POA: Diagnosis not present

## 2016-06-18 DIAGNOSIS — N186 End stage renal disease: Secondary | ICD-10-CM | POA: Diagnosis not present

## 2016-06-20 DIAGNOSIS — D631 Anemia in chronic kidney disease: Secondary | ICD-10-CM | POA: Diagnosis not present

## 2016-06-20 DIAGNOSIS — N2581 Secondary hyperparathyroidism of renal origin: Secondary | ICD-10-CM | POA: Diagnosis not present

## 2016-06-20 DIAGNOSIS — D509 Iron deficiency anemia, unspecified: Secondary | ICD-10-CM | POA: Diagnosis not present

## 2016-06-20 DIAGNOSIS — N186 End stage renal disease: Secondary | ICD-10-CM | POA: Diagnosis not present

## 2016-06-23 DIAGNOSIS — D509 Iron deficiency anemia, unspecified: Secondary | ICD-10-CM | POA: Diagnosis not present

## 2016-06-23 DIAGNOSIS — N186 End stage renal disease: Secondary | ICD-10-CM | POA: Diagnosis not present

## 2016-06-23 DIAGNOSIS — D631 Anemia in chronic kidney disease: Secondary | ICD-10-CM | POA: Diagnosis not present

## 2016-06-23 DIAGNOSIS — N2581 Secondary hyperparathyroidism of renal origin: Secondary | ICD-10-CM | POA: Diagnosis not present

## 2016-06-24 ENCOUNTER — Encounter: Payer: Self-pay | Admitting: Sports Medicine

## 2016-06-24 ENCOUNTER — Ambulatory Visit (INDEPENDENT_AMBULATORY_CARE_PROVIDER_SITE_OTHER): Payer: Self-pay | Admitting: Sports Medicine

## 2016-06-24 DIAGNOSIS — Z9889 Other specified postprocedural states: Secondary | ICD-10-CM

## 2016-06-24 DIAGNOSIS — M79675 Pain in left toe(s): Secondary | ICD-10-CM

## 2016-06-24 DIAGNOSIS — M79674 Pain in right toe(s): Secondary | ICD-10-CM

## 2016-06-24 MED ORDER — HYDROCODONE-ACETAMINOPHEN 5-325 MG PO TABS: 1.0000 | ORAL_TABLET | Freq: Four times a day (QID) | ORAL | 0 refills | Status: DC | PRN

## 2016-06-24 NOTE — Patient Instructions (Signed)

## 2016-06-24 NOTE — Progress Notes (Signed)
Subjective: [name] is a 76 y.o. female patient returns to office today for follow up evaluation after having Right and Left Hallux total permanent nail avulsion performed on 06-10-16. Patient has been soaking using epsom salt and applying topical antibiotic covered with bandaid daily; states that she is in constant pain and throbbing and that nothing has helped to relieve the pain. Patient deniesfever/chills/nausea/vomitting/any other related constitutional symptoms at this time.  Patient Active Problem List   Diagnosis Date Noted  . Onychomycosis 05/09/2016  . Nonspecific chest pain 04/19/2016  . ACS (acute coronary syndrome) ([place]) 04/19/2016  . Leg pain, bilateral 12/05/2015  . Trouble in sleeping 11/22/2015  . End stage renal disease ([place])   . Hyperkalemia 11/12/2015  . Shortness of breath 11/12/2015  . ESRD on dialysis ([place])   . Dyspnea   . Chronic obstructive pulmonary disease ([place])   . Hypertensive urgency 09/20/2015  . Fecal incontinence   . Adverse effects of medication 06/21/2015  . Herpes 06/19/2015  . Skin lesion 06/16/2015  . Genital ulcer, female 06/16/2015  . Onychocryptosis 04/08/2015  . Renal mass   . End stage renal disease on dialysis ([place])   . Inadequate pain control 12/30/2014  . CHF (congestive heart failure) ([place]) 12/30/2014  . Meralgia paresthetica of right side 12/26/2014  . Hereditary and idiopathic peripheral neuropathy 02/15/2013  . Dermatitis of face 08/19/2012  . HIP PAIN, BILATERAL 12/07/2008  . BLADDER PROLAPSE 11/17/2007  . OSTEOARTHRITIS, GENERALIZED, MULTIPLE JOINTS 08/18/2007  . Secondary renal hyperparathyroidism ([place]) 06/07/2007  . ANXIETY STATE, UNSPECIFIED 04/23/2007  . ANEMIA NEC 03/31/2007  . TOBACCO ABUSE 12/10/2006  . HIATAL HERNIA 12/10/2006  . Dysphagia, unspecified(787.20) 12/10/2006  . HYPERCHOLESTEROLEMIA 07/30/2006  . Gout, unspecified 07/30/2006  . DEPRESSIVE DISORDER, NOS 07/30/2006  . HYPERTENSION, BENIGN SYSTEMIC  07/30/2006  . GASTROESOPHAGEAL REFLUX, NO ESOPHAGITIS 07/30/2006    Current Outpatient Prescriptions on File Prior to Visit  Medication Sig Dispense Refill  . acetaminophen (TYLENOL) 500 MG tablet Take 1,000 mg by mouth every 6 (six) hours as needed for mild pain or moderate pain.    Marland Kitchen albuterol (PROVENTIL HFA;VENTOLIN HFA) 108 (90 Base) MCG/ACT inhaler Inhale 2 puffs into the lungs every 4 (four) hours as needed for wheezing or shortness of breath. 1 Inhaler 1  . allopurinol (ZYLOPRIM) 100 MG tablet Take 100 mg by mouth once daily on non-dialysis days (Tues/Thurs/Sat/Sun) 30 tablet 0  . Aluminum & Magnesium Hydroxide 600-300 MG/5ML CONC Take 5 mLs by mouth every 6 (six) hours as needed for indigestion. 150 mL 0  . amLODipine (NORVASC) 10 MG tablet Take 1 tablet (10 mg total) by mouth at bedtime. 30 tablet 0  . ARIPiprazole (ABILIFY) 2 MG tablet Take 0.5 tablets (1 mg total) by mouth daily. 30 tablet 6  . aspirin EC 81 MG tablet Take 81 mg by mouth every morning.    Marland Kitchen atorvastatin (LIPITOR) 40 MG tablet Take 1 tablet (40 mg total) by mouth daily. 90 tablet 3  . cloNIDine (CATAPRES) 0.2 MG tablet Take 1 tablet (0.2 mg total) by mouth 3 (three) times daily. 90 tablet 0  . DULoxetine (CYMBALTA) 30 MG capsule Take 1 capsule (30 mg total) by mouth daily. 90 capsule 3  . fluticasone (FLONASE) 50 MCG/ACT nasal spray Place 2 sprays into both nostrils daily as needed for allergies or rhinitis. 16 g 3  . Homeopathic Products (LEG CRAMP RELIEF PO) Take 1-2 tablets by mouth as needed (for leg pain).    Marland Kitchen ipratropium (ATROVENT  HFA) 17 MCG/ACT inhaler Inhale 2 puffs into the lungs every 4 (four) hours as needed for wheezing. 1 Inhaler 12  . lidocaine-prilocaine (EMLA) cream Apply 1 application topically 3 (three) times a week. Use before dialysis on MWF    . loratadine (CLARITIN) 10 MG tablet Take 1 tablet (10 mg total) by mouth daily as needed for allergies. 90 tablet 3  . losartan (COZAAR) 100 MG tablet  Take 1 tablet (100 mg total) by mouth daily. 30 tablet 0  . metoprolol succinate (TOPROL-XL) 100 MG 24 hr tablet Take 100 mg by mouth at bedtime. Take with or immediately following a meal.    . metoprolol succinate (TOPROL-XL) 50 MG 24 hr tablet Take 50 mg by mouth at bedtime.    . multivitamin (RENA-VIT) TABS tablet Take 1 tablet by mouth at bedtime. (Patient taking differently: Take 1 tablet by mouth every morning. ) 60 tablet 5  . omeprazole (PRILOSEC) 40 MG capsule Take 1 capsule (40 mg total) by mouth 2 (two) times daily. 60 capsule 11  . pregabalin (LYRICA) 100 MG capsule Take 100 mg by mouth every evening.     Marland Kitchen RENVELA 800 MG tablet     . SENSIPAR 90 MG tablet     . traMADol (ULTRAM) 50 MG tablet Take 1 tablet (50 mg total) by mouth every 8 (eight) hours as needed. 15 tablet 0   No current facility-administered medications on file prior to visit.     Allergies  Allergen Reactions  . Tuberculin Tests Hives    "blisters"  . Valacyclovir Other (See Comments)    Confusion and nervousness  . Codeine Nausea And Vomiting  . Penicillins Rash    No problems breathing. Has tolerated omnicef in past without issue Has patient had a PCN reaction causing immediate rash, facial/tongue/throat swelling, SOB or lightheadedness with hypotension: Yes Has patient had a PCN reaction causing severe rash involving mucus membranes or skin necrosis: No Has patient had a PCN reaction that required hospitalization No Has patient had a PCN reaction occurring within the last 10 years: No If all of the above answers are "NO", then may proceed with Cephalosporin  . Sulfamethoxazole Rash    Objective:  General: Well developed, nourished, in no acute distress, alert and oriented x3   Dermatology: Skin is warm, dry and supple bilateral. Bilateral hallux nail beds appear to be clean, dry, with mild granular tissue and surrounding eschar/scab. (-) Erythema. (-) Edema. (-) serosanguous drainage present. The  remaining nails appear unremarkable at this time. There are no other lesions or other signs of infection  present.  Neurovascular status: Intact . No lower extremity swelling; No pain with calf compression bilateral.  Musculoskeletal:There is tenderness to palpation of Bilateral hallux nail beds. Muscular strength within normal limits bilateral.   Assesement and Plan: Problem List Items Addressed This Visit    None    Visit Diagnoses    S/P nail surgery    -  Primary   Toe pain, bilateral       Relevant Medications   HYDROcodone-acetaminophen (NORCO) 5-325 MG tablet      -Examined patient  -Cleansed right and left hallux nail beds and gently scrubbed with peroxide and q-tip/curetted away eschar at site and applied antibiotic cream covered with bandaid.  -Discussed plan of care with patient. -Patient to continue soaking in a weak solution of Epsom salt and warm water. Patient was instructed to soak for 15-20 minutes each day until the toe appears normal and  there is no drainage, redness, tenderness, or swelling at the procedure site, and apply neosporin and a gauze or bandaid dressing each day as needed. May leave open to air at night. -Patient may alternate and use pain EMLA cream to nail beds -Rx [place] 5/325 x 12 tabs for severe pain -Educated patient on long term care after nail surgery. Advised patient that she will take longer to heal because of her ESRD which can cause decreased circulation to toes and areas to be slow to heal -Patient was instructed to monitor the toes for reoccurrence and signs of infection; Patient advised to return to office or go to ER if toe becomes red, hot or swollen. -Patient is to return for final nail re-check in 1 month or sooner if problems arise.  Landis Martins, DPM

## 2016-06-25 DIAGNOSIS — D631 Anemia in chronic kidney disease: Secondary | ICD-10-CM | POA: Diagnosis not present

## 2016-06-25 DIAGNOSIS — N2581 Secondary hyperparathyroidism of renal origin: Secondary | ICD-10-CM | POA: Diagnosis not present

## 2016-06-25 DIAGNOSIS — D509 Iron deficiency anemia, unspecified: Secondary | ICD-10-CM | POA: Diagnosis not present

## 2016-06-25 DIAGNOSIS — N186 End stage renal disease: Secondary | ICD-10-CM | POA: Diagnosis not present

## 2016-06-27 DIAGNOSIS — N186 End stage renal disease: Secondary | ICD-10-CM | POA: Diagnosis not present

## 2016-06-27 DIAGNOSIS — D509 Iron deficiency anemia, unspecified: Secondary | ICD-10-CM | POA: Diagnosis not present

## 2016-06-27 DIAGNOSIS — D631 Anemia in chronic kidney disease: Secondary | ICD-10-CM | POA: Diagnosis not present

## 2016-06-27 DIAGNOSIS — N2581 Secondary hyperparathyroidism of renal origin: Secondary | ICD-10-CM | POA: Diagnosis not present

## 2016-06-30 DIAGNOSIS — N2581 Secondary hyperparathyroidism of renal origin: Secondary | ICD-10-CM | POA: Diagnosis not present

## 2016-06-30 DIAGNOSIS — D631 Anemia in chronic kidney disease: Secondary | ICD-10-CM | POA: Diagnosis not present

## 2016-06-30 DIAGNOSIS — N186 End stage renal disease: Secondary | ICD-10-CM | POA: Diagnosis not present

## 2016-06-30 DIAGNOSIS — D509 Iron deficiency anemia, unspecified: Secondary | ICD-10-CM | POA: Diagnosis not present

## 2016-07-02 DIAGNOSIS — N2581 Secondary hyperparathyroidism of renal origin: Secondary | ICD-10-CM | POA: Diagnosis not present

## 2016-07-02 DIAGNOSIS — E1129 Type 2 diabetes mellitus with other diabetic kidney complication: Secondary | ICD-10-CM | POA: Diagnosis not present

## 2016-07-02 DIAGNOSIS — D631 Anemia in chronic kidney disease: Secondary | ICD-10-CM | POA: Diagnosis not present

## 2016-07-02 DIAGNOSIS — N186 End stage renal disease: Secondary | ICD-10-CM | POA: Diagnosis not present

## 2016-07-02 DIAGNOSIS — D509 Iron deficiency anemia, unspecified: Secondary | ICD-10-CM | POA: Diagnosis not present

## 2016-07-02 DIAGNOSIS — Z992 Dependence on renal dialysis: Secondary | ICD-10-CM | POA: Diagnosis not present

## 2016-07-07 DIAGNOSIS — D631 Anemia in chronic kidney disease: Secondary | ICD-10-CM | POA: Diagnosis not present

## 2016-07-07 DIAGNOSIS — N2581 Secondary hyperparathyroidism of renal origin: Secondary | ICD-10-CM | POA: Diagnosis not present

## 2016-07-07 DIAGNOSIS — N186 End stage renal disease: Secondary | ICD-10-CM | POA: Diagnosis not present

## 2016-07-09 DIAGNOSIS — D631 Anemia in chronic kidney disease: Secondary | ICD-10-CM | POA: Diagnosis not present

## 2016-07-09 DIAGNOSIS — N2581 Secondary hyperparathyroidism of renal origin: Secondary | ICD-10-CM | POA: Diagnosis not present

## 2016-07-09 DIAGNOSIS — N186 End stage renal disease: Secondary | ICD-10-CM | POA: Diagnosis not present

## 2016-07-10 ENCOUNTER — Ambulatory Visit: Payer: Medicare Other | Admitting: Family Medicine

## 2016-07-11 ENCOUNTER — Encounter: Payer: Self-pay | Admitting: Family Medicine

## 2016-07-11 ENCOUNTER — Ambulatory Visit (INDEPENDENT_AMBULATORY_CARE_PROVIDER_SITE_OTHER): Payer: Medicare Other | Admitting: Family Medicine

## 2016-07-11 VITALS — BP 180/78 | HR 77 | Resp 12 | Ht 62.0 in | Wt 182.5 lb

## 2016-07-11 DIAGNOSIS — F324 Major depressive disorder, single episode, in partial remission: Secondary | ICD-10-CM

## 2016-07-11 DIAGNOSIS — K219 Gastro-esophageal reflux disease without esophagitis: Secondary | ICD-10-CM | POA: Diagnosis not present

## 2016-07-11 DIAGNOSIS — N189 Chronic kidney disease, unspecified: Secondary | ICD-10-CM | POA: Diagnosis not present

## 2016-07-11 DIAGNOSIS — N2581 Secondary hyperparathyroidism of renal origin: Secondary | ICD-10-CM | POA: Diagnosis not present

## 2016-07-11 DIAGNOSIS — I5032 Chronic diastolic (congestive) heart failure: Secondary | ICD-10-CM | POA: Diagnosis not present

## 2016-07-11 DIAGNOSIS — D631 Anemia in chronic kidney disease: Secondary | ICD-10-CM | POA: Diagnosis not present

## 2016-07-11 DIAGNOSIS — N186 End stage renal disease: Secondary | ICD-10-CM | POA: Diagnosis not present

## 2016-07-11 DIAGNOSIS — I129 Hypertensive chronic kidney disease with stage 1 through stage 4 chronic kidney disease, or unspecified chronic kidney disease: Secondary | ICD-10-CM

## 2016-07-11 MED ORDER — HYDRALAZINE HCL 10 MG PO TABS: 10.0000 mg | ORAL_TABLET | Freq: Two times a day (BID) | ORAL | 1 refills | Status: DC

## 2016-07-11 NOTE — Progress Notes (Signed)
Pre visit review using our clinic review tool, if applicable. No additional management support is needed unless otherwise documented below in the visit note. 
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
## 2016-07-11 NOTE — Patient Instructions (Addendum)
A few things to remember from today's visit:   Hypertensive renal disease, malignant, with renal failure - Plan: hydrALAZINE (APRESOLINE) 10 MG tablet  Gastroesophageal reflux disease, esophagitis presence not specified  Major depressive disorder with single episode, in partial remission (HCC)   Today Hydralazine 10 mg added 2 times daily, will plan on titrate up to better control blood pressure. No other changes.   Blood pressure goal for most people is less than 140/90. Some populations (older than 60) the goal is less than 150/90.  Most recent cardiologists' recommendations recommend blood pressure at or less than 130/80.   Elevated blood pressure increases the risk of strokes, heart and kidney disease, and eye problems. Regular physical activity and a healthy diet (DASH diet) usually help. Low salt diet. Take medications as instructed.  Caution with some over the counter medications as cold medications, dietary products (for weight loss), and Ibuprofen or Aleve (frequent use);all these medications could cause elevation of blood pressure.    Medicare covers a annual preventive visit, which is strongly recommended , it is once per year and involves a series of questions to identify risk factors; so we can try to prevent possible complications. This does not need to be done by a doctor.  We have a nurse Investment banker, corporate) here that is highly qualified to do it, it can be arrange same date you have a follow up appointment with me or labs scheduled, and it 100% covered by Medicare. So before you leave today I would like for you to arrange visit with Ms [name] for Medicare wellness visit.  Please be sure medication list is accurate. If a new problem present, please set up appointment sooner than planned today.

## 2016-07-14 DIAGNOSIS — N186 End stage renal disease: Secondary | ICD-10-CM | POA: Diagnosis not present

## 2016-07-14 DIAGNOSIS — N2581 Secondary hyperparathyroidism of renal origin: Secondary | ICD-10-CM | POA: Diagnosis not present

## 2016-07-14 DIAGNOSIS — D631 Anemia in chronic kidney disease: Secondary | ICD-10-CM | POA: Diagnosis not present

## 2016-07-16 DIAGNOSIS — N2581 Secondary hyperparathyroidism of renal origin: Secondary | ICD-10-CM | POA: Diagnosis not present

## 2016-07-16 DIAGNOSIS — N186 End stage renal disease: Secondary | ICD-10-CM | POA: Diagnosis not present

## 2016-07-16 DIAGNOSIS — D631 Anemia in chronic kidney disease: Secondary | ICD-10-CM | POA: Diagnosis not present

## 2016-07-18 DIAGNOSIS — N186 End stage renal disease: Secondary | ICD-10-CM | POA: Diagnosis not present

## 2016-07-18 DIAGNOSIS — D631 Anemia in chronic kidney disease: Secondary | ICD-10-CM | POA: Diagnosis not present

## 2016-07-18 DIAGNOSIS — N2581 Secondary hyperparathyroidism of renal origin: Secondary | ICD-10-CM | POA: Diagnosis not present

## 2016-07-21 DIAGNOSIS — N2581 Secondary hyperparathyroidism of renal origin: Secondary | ICD-10-CM | POA: Diagnosis not present

## 2016-07-21 DIAGNOSIS — N186 End stage renal disease: Secondary | ICD-10-CM | POA: Diagnosis not present

## 2016-07-21 DIAGNOSIS — D631 Anemia in chronic kidney disease: Secondary | ICD-10-CM | POA: Diagnosis not present

## 2016-07-22 ENCOUNTER — Ambulatory Visit (INDEPENDENT_AMBULATORY_CARE_PROVIDER_SITE_OTHER): Payer: Self-pay | Admitting: Sports Medicine

## 2016-07-22 DIAGNOSIS — Z9889 Other specified postprocedural states: Secondary | ICD-10-CM

## 2016-07-22 DIAGNOSIS — M79675 Pain in left toe(s): Secondary | ICD-10-CM

## 2016-07-22 DIAGNOSIS — M79674 Pain in right toe(s): Secondary | ICD-10-CM

## 2016-07-22 NOTE — Progress Notes (Signed)
Subjective: [name] is a 76 y.o. female patient returns to office today for follow up evaluation after having Right and Left Hallux total permanent nail avulsion performed on 06-10-16. Patient has been soaking using epsom salt and applying topical antibiotic covered with bandaid daily; states that pain is improving now 2/10. Patient denies fever/chills/nausea/vomitting/any other related constitutional symptoms at this time.  Patient Active Problem List   Diagnosis Date Noted  . Onychomycosis 05/09/2016  . Nonspecific chest pain 04/19/2016  . ACS (acute coronary syndrome) ([place]) 04/19/2016  . Leg pain, bilateral 12/05/2015  . Trouble in sleeping 11/22/2015  . End stage renal disease ([place])   . Hyperkalemia 11/12/2015  . Shortness of breath 11/12/2015  . ESRD on dialysis ([place])   . Dyspnea   . Chronic obstructive pulmonary disease ([place])   . Hypertensive urgency 09/20/2015  . Fecal incontinence   . Adverse effects of medication 06/21/2015  . Herpes 06/19/2015  . Skin lesion 06/16/2015  . Recurrent genital herpes 06/16/2015  . Onychocryptosis 04/08/2015  . Renal mass   . End stage renal disease on dialysis ([place])   . Inadequate pain control 12/30/2014  . CHF (congestive heart failure) ([place]) 12/30/2014  . Meralgia paresthetica of right side 12/26/2014  . Hereditary and idiopathic peripheral neuropathy 02/15/2013  . Dermatitis of face 08/19/2012  . HIP PAIN, BILATERAL 12/07/2008  . BLADDER PROLAPSE 11/17/2007  . OSTEOARTHRITIS, GENERALIZED, MULTIPLE JOINTS 08/18/2007  . 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 ANXIETY STATE, UNSPECIFIED 04/23/2007  . ANEMIA NEC 03/31/2007  . TOBACCO ABUSE 12/10/2006  . HIATAL HERNIA 12/10/2006  . Dysphagia, unspecified(787.20) 12/10/2006  . HYPERCHOLESTEROLEMIA 07/30/2006  . Gout, unspecified 07/30/2006  . Major depression in partial remission ([place]) 07/30/2006  . Hypertensive renal disease, malignant, with renal failure  07/30/2006  . GASTROESOPHAGEAL REFLUX, NO ESOPHAGITIS 07/30/2006    Current Outpatient Prescriptions on File Prior to Visit  Medication Sig Dispense Refill  . acetaminophen (TYLENOL) 500 MG tablet Take 1,000 mg by mouth every 6 (six) hours as needed for mild pain or moderate pain.    Marland Kitchen albuterol (PROVENTIL HFA;VENTOLIN HFA) 108 (90 Base) MCG/ACT inhaler Inhale 2 puffs into the lungs every 4 (four) hours as needed for wheezing or shortness of breath. 1 Inhaler 1  . allopurinol (ZYLOPRIM) 100 MG tablet Take 100 mg by mouth once daily on non-dialysis days (Tues/Thurs/Sat/Sun) 30 tablet 0  . Aluminum & Magnesium Hydroxide 600-300 MG/5ML CONC Take 5 mLs by mouth every 6 (six) hours as needed for indigestion. 150 mL 0  . amLODipine (NORVASC) 10 MG tablet Take 1 tablet (10 mg total) by mouth at bedtime. 30 tablet 0  . ARIPiprazole (ABILIFY) 2 MG tablet Take 0.5 tablets (1 mg total) by mouth daily. 30 tablet 6  . aspirin EC 81 MG tablet Take 81 mg by mouth every morning.    Marland Kitchen atorvastatin (LIPITOR) 40 MG tablet Take 1 tablet (40 mg total) by mouth daily. 90 tablet 3  . cloNIDine (CATAPRES) 0.2 MG tablet Take 1 tablet (0.2 mg total) by mouth 3 (three) times daily. 90 tablet 0  . DULoxetine (CYMBALTA) 30 MG capsule Take 1 capsule (30 mg total) by mouth daily. 90 capsule 3  . fluticasone (FLONASE) 50 MCG/ACT nasal spray Place 2 sprays into both nostrils daily as needed for allergies or rhinitis. 16 g 3  . Homeopathic Products (LEG CRAMP RELIEF PO) Take 1-2 tablets by mouth as needed (for leg pain).    . hydrALAZINE (APRESOLINE) 10  MG tablet Take 1 tablet (10 mg total) by mouth 2 (two) times daily. 60 tablet 1  . ipratropium (ATROVENT HFA) 17 MCG/ACT inhaler Inhale 2 puffs into the lungs every 4 (four) hours as needed for wheezing. 1 Inhaler 12  . lidocaine-prilocaine (EMLA) cream Apply 1 application topically 3 (three) times a week. Use before dialysis on MWF    . loratadine (CLARITIN) 10 MG tablet Take 1  tablet (10 mg total) by mouth daily as needed for allergies. 90 tablet 3  . losartan (COZAAR) 100 MG tablet Take 1 tablet (100 mg total) by mouth daily. 30 tablet 0  . metoprolol succinate (TOPROL-XL) 100 MG 24 hr tablet Take 100 mg by mouth at bedtime. Take with or immediately following a meal.    . metoprolol succinate (TOPROL-XL) 50 MG 24 hr tablet Take 50 mg by mouth at bedtime.    . multivitamin (RENA-VIT) TABS tablet Take 1 tablet by mouth at bedtime. (Patient taking differently: Take 1 tablet by mouth every morning. ) 60 tablet 5  . omeprazole (PRILOSEC) 40 MG capsule Take 1 capsule (40 mg total) by mouth 2 (two) times daily. 60 capsule 11  . pregabalin (LYRICA) 100 MG capsule Take 100 mg by mouth every evening.     Marland Kitchen RENVELA 800 MG tablet     . SENSIPAR 90 MG tablet      No current facility-administered medications on file prior to visit.     Allergies  Allergen Reactions  . Tuberculin Tests Hives    "blisters"  . Valacyclovir Other (See Comments)    Confusion and nervousness  . Codeine Nausea And Vomiting  . Penicillins Rash    No problems breathing. Has tolerated omnicef in past without issue Has patient had a PCN reaction causing immediate rash, facial/tongue/throat swelling, SOB or lightheadedness with hypotension: Yes Has patient had a PCN reaction causing severe rash involving mucus membranes or skin necrosis: No Has patient had a PCN reaction that required hospitalization No Has patient had a PCN reaction occurring within the last 10 years: No If all of the above answers are "NO", then may proceed with Cephalosporin  . Sulfamethoxazole Rash    Objective:  General: Well developed, nourished, in no acute distress, alert and oriented x3   Dermatology: Skin is warm, dry and supple bilateral. Bilateral hallux nail beds appear to be clean, dry, with mild granular tissue and surrounding eschar/scab. (-) Erythema. (-) Edema. (-) serosanguous drainage present. The remaining  nails appear unremarkable at this time. There are no other lesions or other signs of infection  present.  Neurovascular status: Intact . No lower extremity swelling; No pain with calf compression bilateral.  Musculoskeletal:There is decreased tenderness to palpation of Bilateral hallux nail beds. Muscular strength within normal limits bilateral.   Assesement and Plan: Problem List Items Addressed This Visit    None    Visit Diagnoses    S/P nail surgery    -  Primary   Toe pain, bilateral          -Examined patient  -Cleansed right and left hallux nail beds and gently scrubbed with peroxide and q-tip/curetted away eschar at site and applied antibiotic cream covered with bandaid.  -Discussed plan of care with patient. -Patient to continue soaking in a weak solution of Epsom salt and warm water. Patient was instructed to soak for 15-20 minutes each day until the toe appears normal and there is no drainage, redness, tenderness, or swelling at the procedure site, and  apply neosporin and a gauze or bandaid dressing each day as needed. May leave open to air at night. -Patient may alternate and use pain EMLA cream to nail beds -Educated patient on long term care after nail surgery. Advised patient that she will take longer to heal because of her ESRD which can cause decreased circulation to toes and areas to be slow to heal -Patient was instructed to monitor the toes for reoccurrence and signs of infection; Patient advised to return to office or go to ER if toe becomes red, hot or swollen. -Patient is to return for final nail re-check in 6 weeks since slow healing or sooner if problems arise.  Landis Martins, DPM

## 2016-07-23 DIAGNOSIS — D631 Anemia in chronic kidney disease: Secondary | ICD-10-CM | POA: Diagnosis not present

## 2016-07-23 DIAGNOSIS — N2581 Secondary hyperparathyroidism of renal origin: Secondary | ICD-10-CM | POA: Diagnosis not present

## 2016-07-23 DIAGNOSIS — N186 End stage renal disease: Secondary | ICD-10-CM | POA: Diagnosis not present

## 2016-07-25 DIAGNOSIS — N2581 Secondary hyperparathyroidism of renal origin: Secondary | ICD-10-CM | POA: Diagnosis not present

## 2016-07-25 DIAGNOSIS — N186 End stage renal disease: Secondary | ICD-10-CM | POA: Diagnosis not present

## 2016-07-25 DIAGNOSIS — D631 Anemia in chronic kidney disease: Secondary | ICD-10-CM | POA: Diagnosis not present

## 2016-07-28 DIAGNOSIS — N2581 Secondary hyperparathyroidism of renal origin: Secondary | ICD-10-CM | POA: Diagnosis not present

## 2016-07-28 DIAGNOSIS — D631 Anemia in chronic kidney disease: Secondary | ICD-10-CM | POA: Diagnosis not present

## 2016-07-28 DIAGNOSIS — N186 End stage renal disease: Secondary | ICD-10-CM | POA: Diagnosis not present

## 2016-07-30 DIAGNOSIS — D631 Anemia in chronic kidney disease: Secondary | ICD-10-CM | POA: Diagnosis not present

## 2016-07-30 DIAGNOSIS — N2581 Secondary hyperparathyroidism of renal origin: Secondary | ICD-10-CM | POA: Diagnosis not present

## 2016-07-30 DIAGNOSIS — Z992 Dependence on renal dialysis: Secondary | ICD-10-CM | POA: Diagnosis not present

## 2016-07-30 DIAGNOSIS — N186 End stage renal disease: Secondary | ICD-10-CM | POA: Diagnosis not present

## 2016-07-30 DIAGNOSIS — E1129 Type 2 diabetes mellitus with other diabetic kidney complication: Secondary | ICD-10-CM | POA: Diagnosis not present

## 2016-08-01 DIAGNOSIS — D631 Anemia in chronic kidney disease: Secondary | ICD-10-CM | POA: Diagnosis not present

## 2016-08-01 DIAGNOSIS — N2581 Secondary hyperparathyroidism of renal origin: Secondary | ICD-10-CM | POA: Diagnosis not present

## 2016-08-01 DIAGNOSIS — N186 End stage renal disease: Secondary | ICD-10-CM | POA: Diagnosis not present

## 2016-08-04 DIAGNOSIS — N2581 Secondary hyperparathyroidism of renal origin: Secondary | ICD-10-CM | POA: Diagnosis not present

## 2016-08-04 DIAGNOSIS — D631 Anemia in chronic kidney disease: Secondary | ICD-10-CM | POA: Diagnosis not present

## 2016-08-04 DIAGNOSIS — N186 End stage renal disease: Secondary | ICD-10-CM | POA: Diagnosis not present

## 2016-08-06 DIAGNOSIS — N186 End stage renal disease: Secondary | ICD-10-CM | POA: Diagnosis not present

## 2016-08-06 DIAGNOSIS — D631 Anemia in chronic kidney disease: Secondary | ICD-10-CM | POA: Diagnosis not present

## 2016-08-06 DIAGNOSIS — N2581 Secondary hyperparathyroidism of renal origin: Secondary | ICD-10-CM | POA: Diagnosis not present

## 2016-08-06 NOTE — Progress Notes (Signed)
[name] is a 76 y.o.female, who is here today to follow on HTN. She is also having her Medicare preventive visit today.   She was seen on 07/11/16. Sine her last OV she has seen podiatrists to follow on total nail avulsion left hallux.  Hx of ESRD on dialysis, CAD,CHF, and peripheral neuropathy among some.  HTN : BP was elevated, medications were adjusted last OV.  Currently she is on Metoprolol Succinate 50 mg daily, Hydralazine 10 mg bid (added last OV),Cozaar 100 mg daily,Clonidine patch,and Amlodipine 10 mg. She is taking medications as instructed, no side effects reported.  She has not noted unusual headache, visual changes, exertional chest pain, dyspnea,  focal weakness, or edema.  BP before dialysis 170's/80's an after < 140/90. These numbers are better that the ones she had before Hydralazine was added.  Lab Results  Component Value Date   CREATININE 8.08 (H) 04/20/2016   BUN 28 (H) 04/20/2016   NA 131 (L) 04/20/2016   K 3.7 04/20/2016   CL 93 (L) 04/20/2016   CO2 24 04/20/2016   -Since her last OV she also decreased bread intake and salt intake, she has noted some wt loss.  -Today she is c/o "places in" her mouth,tender for the past 2-3 weeks.She can not wear dentures because of pain. Denies trauma. Lesions are improving. She states that she has hx of genital herpes and concerned about this oral lesions being related. She states that she has not had oral sex in years and no similar lesions in the past.  -Allergic rhinitis: + Rhinorrhea,nasal congestion,epiphora, and itchy eyes/nose. Symptoms are worse for the past few days. Symptoms exacerbated by working on her yard,cutting grass. No fever,chills,odynophagia. She is on Claritin but Rx has expired.  + Smoker. Denies chewing tobacco.   Review of Systems  Constitutional: Positive for fatigue (No more than usual). Negative for activity change, appetite change, fever and unexpected weight change.    HENT: Positive for congestion, mouth sores, postnasal drip and rhinorrhea. Negative for nosebleeds, sinus pain, sore throat and trouble swallowing.   Eyes: Positive for discharge and itching. Negative for pain and visual disturbance.  Respiratory: Negative for cough, shortness of breath and wheezing.   Cardiovascular: Negative for chest pain, palpitations and leg swelling.  Gastrointestinal: Negative for abdominal pain, nausea and vomiting.       Negative for changes in bowel habits.  Genitourinary: Negative for difficulty urinating and hematuria.  Musculoskeletal: Positive for back pain and gait problem. Negative for joint swelling.  Skin: Negative for rash.  Neurological: Negative for syncope, weakness and headaches.  Psychiatric/Behavioral: Negative for confusion and suicidal ideas. The patient is not nervous/anxious.      Current Outpatient Prescriptions on File Prior to Visit  Medication Sig Dispense Refill  . acetaminophen (TYLENOL) 500 MG tablet Take 1,000 mg by mouth every 6 (six) hours as needed for mild pain or moderate pain.    Marland Kitchen albuterol (PROVENTIL HFA;VENTOLIN HFA) 108 (90 Base) MCG/ACT inhaler Inhale 2 puffs into the lungs every 4 (four) hours as needed for wheezing or shortness of breath. 1 Inhaler 1  . allopurinol (ZYLOPRIM) 100 MG tablet Take 100 mg by mouth once daily on non-dialysis days (Tues/Thurs/Sat/Sun) 30 tablet 0  . Aluminum & Magnesium Hydroxide 600-300 MG/5ML CONC Take 5 mLs by mouth every 6 (six) hours as needed for indigestion. 150 mL 0  . amLODipine (NORVASC) 10 MG tablet Take 1 tablet (10 mg total) by mouth at bedtime.  30 tablet 0  . ARIPiprazole (ABILIFY) 2 MG tablet Take 0.5 tablets (1 mg total) by mouth daily. 30 tablet 6  . aspirin EC 81 MG tablet Take 81 mg by mouth every morning.    Marland Kitchen atorvastatin (LIPITOR) 40 MG tablet Take 1 tablet (40 mg total) by mouth daily. 90 tablet 3  . cloNIDine (CATAPRES) 0.2 MG tablet Take 1 tablet (0.2 mg total) by mouth 3  (three) times daily. 90 tablet 0  . DULoxetine (CYMBALTA) 30 MG capsule Take 1 capsule (30 mg total) by mouth daily. 90 capsule 3  . fluticasone (FLONASE) 50 MCG/ACT nasal spray Place 2 sprays into both nostrils daily as needed for allergies or rhinitis. 16 g 3  . Homeopathic Products (LEG CRAMP RELIEF PO) Take 1-2 tablets by mouth as needed (for leg pain).    Marland Kitchen ipratropium (ATROVENT HFA) 17 MCG/ACT inhaler Inhale 2 puffs into the lungs every 4 (four) hours as needed for wheezing. 1 Inhaler 12  . lidocaine-prilocaine (EMLA) cream Apply 1 application topically 3 (three) times a week. Use before dialysis on MWF    . loratadine (CLARITIN) 10 MG tablet Take 1 tablet (10 mg total) by mouth daily as needed for allergies. 90 tablet 3  . losartan (COZAAR) 100 MG tablet Take 1 tablet (100 mg total) by mouth daily. 30 tablet 0  . metoprolol succinate (TOPROL-XL) 100 MG 24 hr tablet Take 100 mg by mouth at bedtime. Take with or immediately following a meal.    . metoprolol succinate (TOPROL-XL) 50 MG 24 hr tablet Take 50 mg by mouth at bedtime.    . multivitamin (RENA-VIT) TABS tablet Take 1 tablet by mouth at bedtime. (Patient taking differently: Take 1 tablet by mouth every morning. ) 60 tablet 5  . omeprazole (PRILOSEC) 40 MG capsule Take 1 capsule (40 mg total) by mouth 2 (two) times daily. 60 capsule 11  . pregabalin (LYRICA) 100 MG capsule Take 100 mg by mouth every evening.     Marland Kitchen RENVELA 800 MG tablet     . SENSIPAR 90 MG tablet      No current facility-administered medications on file prior to visit.      Past Medical History:  Diagnosis Date  . Abnormality of gait 03/28/2015  . Adrenal mass ([place])   . 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 Arthritis   . Back pain   . CHF (congestive heart failure) ([place])   . Chronic kidney disease    Hemo MWF  . Constipation   . COPD (chronic obstructive pulmonary disease) ([place])   . Depression   . Dialysis patient  [facility])    kidney  . Diverticulitis   . GERD (gastroesophageal reflux disease)   . H/O hiatal hernia   . High cholesterol   . Hyperlipidemia   . Hypertension   . IBS (irritable bowel syndrome)   . Meralgia paresthetica of right side 12/26/2014  . Normal cardiac stress test 12/24/2009   lexiscan, imaging normal  . Renal disorder   . RLS (restless legs syndrome)   . Seizures ([place])    2004  . Sinus complaint   . Thyroid disease   . TIA (transient ischemic attack)   . Tubular adenoma of colon 01/2008    Allergies  Allergen Reactions  . Tuberculin Tests Hives    "blisters"  . Valacyclovir Other (See Comments)    Confusion and nervousness  . Codeine Nausea And Vomiting  .  Penicillins Rash    No problems breathing. Has tolerated omnicef in past without issue Has patient had a PCN reaction causing immediate rash, facial/tongue/throat swelling, SOB or lightheadedness with hypotension: Yes Has patient had a PCN reaction causing severe rash involving mucus membranes or skin necrosis: No Has patient had a PCN reaction that required hospitalization No Has patient had a PCN reaction occurring within the last 10 years: No If all of the above answers are "NO", then may proceed with Cephalosporin  . Sulfamethoxazole Rash    Social History   Social History  . Marital status: Single    Spouse name: N/A  . Number of children: 3  . Years of education: 15   Occupational History  . retired    Social History Main Topics  . Smoking status: Current Every Day Smoker    Packs/day: 1.00    Years: 60.00    Types: Cigarettes    Start date: 06/16/2014    Last attempt to quit: 05/31/2015  . Smokeless tobacco: Never Used     Comment: smoker since 76 yo.  1.5 ppd of [place] 100 lights.   . Alcohol use No  . Drug use: No     Comment: No hx of IV drug use  . Sexual activity: No   Other Topics Concern  . None   Social History Narrative   ** Merged History Encounter **       Lives in  [place] with Daughter, granddaughters (twins) and mentally challenged niece.   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   Patient has a college education.   Patient drinks one cup of caffeine daily.                Vitals:   08/07/16 0913  BP: 120/60  Pulse: 88  Resp: 12  O2 sat 96% at RA Body mass index is 32.24 kg/m.  Wt Readings from Last 3 Encounters:  08/07/16 176 lb 4 oz (79.9 kg)  07/11/16 182 lb 8 oz (82.8 kg)  05/15/16 184 lb 8 oz (83.7 kg)     Physical Exam  Nursing note and vitals reviewed. Constitutional: She is oriented to person, place, and time. She appears well-developed. No distress.  HENT:  Head: Atraumatic.  Mouth/Throat: Oropharynx is clear and moist and mucous membranes are normal. She has dentures.    Left lower gum with superficial ulceration inner and outer aspect of gum (arrow), missing teeth in this area.Lesions are about 1 mm,tender with palpation,no erythema  Eyes: Conjunctivae and EOM are normal.  Neck: No JVD present.  Cardiovascular: Normal rate and regular rhythm.   Murmur (I/VI SEM LUSB) heard. Respiratory: Effort normal and breath sounds normal. No respiratory distress.  GI: Soft. She exhibits no mass. There is no hepatomegaly. There is no tenderness.  Musculoskeletal: She exhibits no edema.  Lymphadenopathy:    She has no cervical adenopathy.  Neurological: She is alert and oriented to person, place, and time. She has normal strength. Coordination normal.  Slow,stable gait, no assisted.  Skin: Skin is warm. No erythema.  Psychiatric: She has a normal mood and affect.  Well groomed, good eye contact.    ASSESSMENT AND PLAN:   Katheren was seen today for follow-up.  Diagnoses and all orders for this visit:  Hypertensive renal disease, malignant, with renal failure  Better controlled. No changes in current management. DASH-low salt diet recommended. F/U in 5 months, before if needed.  -     hydrALAZINE  (APRESOLINE)  10 MG tablet; Take 1 tablet (10 mg total) by mouth 2 (two) times daily.  Allergic rhinitis, unspecified chronicity, unspecified seasonality, unspecified trigger  Claritin Rx sent to continue 10 mg daily. Astelin nasal spray added. Continue Flonase nasal spray. Saline nasal irrigations as needed.   -     azelastine (ASTELIN) 0.1 % nasal spray; Place 1 spray into both nostrils 2 (two) times daily. Use in each nostril as directed  Aphthae, oral  Reassured,I do not think lesions are caused by herpes 2. Reporting that lesions are healing. Topical lidocaine to help with pain. F/U if lesions are not resolved in a few weeks.  -     lidocaine (XYLOCAINE) 2 % solution; Use as directed 2 mLs in the mouth or throat every 8 (eight) hours as needed for mouth pain (aphthas).  BMI 32.0-32.9,adult  Lost about 6 Lb since ehr last OV. We discussed benefits of wt loss as well as adverse effects of obesity. Consistency with healthy diet and physical activity recommended. Walking in place while watching TV and as tolerated is a good option for her,given her Hx of chronic pain.  Need for vaccination against Streptococcus pneumoniae -     Pneumococcal conjugate vaccine 13-valent      -[name] was advised to return sooner than planned today if new concerns arise.     Betty G. Martinique, MD  [facility].

## 2016-08-06 NOTE — Patient Instructions (Addendum)
WE NOW OFFER   [name] FAST TRACK!!!  SAME DAY Appointments for ACUTE CARE  Such as: Sprains, Injuries, cuts, abrasions, rashes, muscle pain, joint pain, back pain Colds, flu, sore throats, headache, allergies, cough, fever  Ear pain, sinus and eye infections Abdominal pain, nausea, vomiting, diarrhea, upset stomach Animal/insect bites  3 Easy Ways to Schedule: Walk-In Scheduling Call in scheduling Mychart Sign-up: [email or web address]    A few things to remember from today's visit:   Hypertensive renal disease, malignant, with renal failure  Allergic rhinitis, unspecified chronicity, unspecified seasonality, unspecified trigger - Plan: azelastine (ASTELIN) 0.1 % nasal spray  Aphthae, oral - Plan: lidocaine (XYLOCAINE) 2 % solution  No changes today. Keeps hands warm.   Please be sure medication list is accurate. If a new problem present, please set up appointment sooner than planned today.    [name] , Thank you for taking time to come for your Medicare Wellness Visit. I appreciate your ongoing commitment to your health goals. Please review the following plan we discussed and let me know if I can assist you in the future.   Manuela Schwartz will order a bone density scan at [facility]; Manuela Schwartz to call and have them schedule mammogram and bone density together  Please check with the dialysis center to see if you have rec'd the prevnar. If not, you can schedule a nurse visit to take.  Manuela Schwartz documented your flu vaccine  Please see the dietician in the dailysis center to be sure you are getting all the nutrients you need in your diet to stay healthy!    These are the goals we discussed: Goals    . Quit smoking / using tobacco          Smoking now .5 a pack Instead of smoking; chewing gum  Educated to avoid secondary smoke Smoking cessation at [facility]: [phone] Classes offered a couple of times a month;  Will work with the patient as far as  registration and location  Meds may help; chatix (Varenicline); Zyban (Bupropion SR); Nicotine Replacement (gum; lozenges; patches; etc.)   30 pack yr smoking hx: Educated regarding LDCT; To discuss with MD at next fup. (when you quit)           This is a list of the screening recommended for you and due dates:  Health Maintenance  Topic Date Due  . DEXA scan (bone density measurement)  12/02/2006  . Pneumonia vaccines (1 of 2 - PCV13) 12/02/2006  . Mammogram  08/12/2015  . Flu Shot  01/01/2016  . Tetanus Vaccine  08/01/2018  . Colon Cancer Screening  08/28/2018   Prevention of falls: Remove rugs or any tripping hazards in the home Use Non slip mats in bathtubs and showers Placing grab bars next to the toilet and or shower Placing handrails on both sides of the stair way Adding extra lighting in the home.   Personal safety issues reviewed:  1. Consider starting a community watch program per [facility] 2.  Changes batteries is smoke detector and/or carbon monoxide detector  3.  If you have firearms; keep them in a safe place 4.  Wear protection when in the sun; Always wear sunscreen or a hat; It is good to have your doctor check your skin annually or review any new areas of concern 5. Driving safety; Keep in the right lane; stay 3 car lengths behind the car in front of you on the highway; look 3 times prior to pulling out; carry your  cell phone everywhere you go!    Learn about the Yellow Dot program:  The program allows first responders at your emergency to have access to who your physician is, as well as your medications and medical conditions.  Citizens requesting the Yellow Dot Packages should contact Master Corporal Nunzio Cobbs at the [facility] [phone] for the first week of the program and beginning the week after Easter citizens should contact their Scientist, physiological.      Health Maintenance for Postmenopausal  Women Menopause is a normal process in which your reproductive ability comes to an end. This process happens gradually over a span of months to years, usually between the ages of 23 and 29. Menopause is complete when you have missed 12 consecutive menstrual periods. It is important to talk with your health care provider about some of the most common conditions that affect postmenopausal women, such as heart disease, cancer, and bone loss (osteoporosis). Adopting a healthy lifestyle and getting preventive care can help to promote your health and wellness. Those actions can also lower your chances of developing some of these common conditions. What should I know about menopause? During menopause, you may experience a number of symptoms, such as:  Moderate-to-severe hot flashes.  Night sweats.  Decrease in sex drive.  Mood swings.  Headaches.  Tiredness.  Irritability.  Memory problems.  Insomnia. Choosing to treat or not to treat menopausal changes is an individual decision that you make with your health care provider. What should I know about hormone replacement therapy and supplements? Hormone therapy products are effective for treating symptoms that are associated with menopause, such as hot flashes and night sweats. Hormone replacement carries certain risks, especially as you become older. If you are thinking about using estrogen or estrogen with progestin treatments, discuss the benefits and risks with your health care provider. What should I know about heart disease and stroke? Heart disease, heart attack, and stroke become more likely as you age. This may be due, in part, to the hormonal changes that your body experiences during menopause. These can affect how your body processes dietary fats, triglycerides, and cholesterol. Heart attack and stroke are both medical emergencies. There are many things that you can do to help prevent heart disease and stroke:  Have your blood pressure  checked at least every 1-2 years. High blood pressure causes heart disease and increases the risk of stroke.  If you are 73-45 years old, ask your health care provider if you should take aspirin to prevent a heart attack or a stroke.  Do not use any tobacco products, including cigarettes, chewing tobacco, or electronic cigarettes. If you need help quitting, ask your health care provider.  It is important to eat a healthy diet and maintain a healthy weight.  Be sure to include plenty of vegetables, fruits, low-fat dairy products, and lean protein.  Avoid eating foods that are high in solid fats, added sugars, or salt (sodium).  Get regular exercise. This is one of the most important things that you can do for your health.  Try to exercise for at least 150 minutes each week. The type of exercise that you do should increase your heart rate and make you sweat. This is known as moderate-intensity exercise.  Try to do strengthening exercises at least twice each week. Do these in addition to the moderate-intensity exercise.  Know your numbers.Ask your health care provider to check your cholesterol and your blood glucose. Continue to have  your blood tested as directed by your health care provider. What should I know about cancer screening? There are several types of cancer. Take the following steps to reduce your risk and to catch any cancer development as early as possible. Breast Cancer  Practice breast self-awareness.  This means understanding how your breasts normally appear and feel.  It also means doing regular breast self-exams. Let your health care provider know about any changes, no matter how small.  If you are 15 or older, have a clinician do a breast exam (clinical breast exam or CBE) every year. Depending on your age, family history, and medical history, it may be recommended that you also have a yearly breast X-ray (mammogram).  If you have a family history of breast cancer,  talk with your health care provider about genetic screening.  If you are at high risk for breast cancer, talk with your health care provider about having an MRI and a mammogram every year.  Breast cancer (BRCA) gene test is recommended for women who have family members with BRCA-related cancers. Results of the assessment will determine the need for genetic counseling and BRCA1 and for BRCA2 testing. BRCA-related cancers include these types:  Breast. This occurs in males or females.  Ovarian.  Tubal. This may also be called fallopian tube cancer.  Cancer of the abdominal or pelvic lining (peritoneal cancer).  Prostate.  Pancreatic. Cervical, Uterine, and Ovarian Cancer  Your health care provider may recommend that you be screened regularly for cancer of the pelvic organs. These include your ovaries, uterus, and vagina. This screening involves a pelvic exam, which includes checking for microscopic changes to the surface of your cervix (Pap test).  For women ages 21-65, health care providers may recommend a pelvic exam and a Pap test every three years. For women ages 27-65, they may recommend the Pap test and pelvic exam, combined with testing for human papilloma virus (HPV), every five years. Some types of HPV increase your risk of cervical cancer. Testing for HPV may also be done on women of any age who have unclear Pap test results.  Other health care providers may not recommend any screening for nonpregnant women who are considered low risk for pelvic cancer and have no symptoms. Ask your health care provider if a screening pelvic exam is right for you.  If you have had past treatment for cervical cancer or a condition that could lead to cancer, you need Pap tests and screening for cancer for at least 20 years after your treatment. If Pap tests have been discontinued for you, your risk factors (such as having a new sexual partner) need to be reassessed to determine if you should start having  screenings again. Some women have medical problems that increase the chance of getting cervical cancer. In these cases, your health care provider may recommend that you have screening and Pap tests more often.  If you have a family history of uterine cancer or ovarian cancer, talk with your health care provider about genetic screening.  If you have vaginal bleeding after reaching menopause, tell your health care provider.  There are currently no reliable tests available to screen for ovarian cancer. Lung Cancer  Lung cancer screening is recommended for adults 80-77 years old who are at high risk for lung cancer because of a history of smoking. A yearly low-dose CT scan of the lungs is recommended if you:  Currently smoke.  Have a history of at least 30 pack-years of smoking  and you currently smoke or have quit within the past 15 years. A pack-year is smoking an average of one pack of cigarettes per day for one year. Yearly screening should:  Continue until it has been 15 years since you quit.  Stop if you develop a health problem that would prevent you from having lung cancer treatment. Colorectal Cancer  This type of cancer can be detected and can often be prevented.  Routine colorectal cancer screening usually begins at age 84 and continues through age 25.  If you have risk factors for colon cancer, your health care provider may recommend that you be screened at an earlier age.  If you have a family history of colorectal cancer, talk with your health care provider about genetic screening.  Your health care provider may also recommend using home test kits to check for hidden blood in your stool.  A small camera at the end of a tube can be used to examine your colon directly (sigmoidoscopy or colonoscopy). This is done to check for the earliest forms of colorectal cancer.  Direct examination of the colon should be repeated every 5-10 years until age 5. However, if early forms of  precancerous polyps or small growths are found or if you have a family history or genetic risk for colorectal cancer, you may need to be screened more often. Skin Cancer  Check your skin from head to toe regularly.  Monitor any moles. Be sure to tell your health care provider:  About any new moles or changes in moles, especially if there is a change in a mole's shape or color.  If you have a mole that is larger than the size of a pencil eraser.  If any of your family members has a history of skin cancer, especially at a young age, talk with your health care provider about genetic screening.  Always use sunscreen. Apply sunscreen liberally and repeatedly throughout the day.  Whenever you are outside, protect yourself by wearing long sleeves, pants, a wide-brimmed hat, and sunglasses. What should I know about osteoporosis? Osteoporosis is a condition in which bone destruction happens more quickly than new bone creation. After menopause, you may be at an increased risk for osteoporosis. To help prevent osteoporosis or the bone fractures that can happen because of osteoporosis, the following is recommended:  If you are 25-42 years old, get at least 1,000 mg of calcium and at least 600 mg of vitamin D per day.  If you are older than age 34 but younger than age 83, get at least 1,200 mg of calcium and at least 600 mg of vitamin D per day.  If you are older than age 49, get at least 1,200 mg of calcium and at least 800 mg of vitamin D per day. Smoking and excessive alcohol intake increase the risk of osteoporosis. Eat foods that are rich in calcium and vitamin D, and do weight-bearing exercises several times each week as directed by your health care provider. What should I know about how menopause affects my mental health? Depression may occur at any age, but it is more common as you become older. Common symptoms of depression include:  Low or sad mood.  Changes in sleep patterns.  Changes  in appetite or eating patterns.  Feeling an overall lack of motivation or enjoyment of activities that you previously enjoyed.  Frequent crying spells. Talk with your health care provider if you think that you are experiencing depression. What should I know about  immunizations? It is important that you get and maintain your immunizations. These include:  Tetanus, diphtheria, and pertussis (Tdap) booster vaccine.  Influenza every year before the flu season begins.  Pneumonia vaccine.  Shingles vaccine. Your health care provider may also recommend other immunizations. This information is not intended to replace advice given to you by your health care provider. Make sure you discuss any questions you have with your health care provider. Document Released: 07/11/2005 Document Revised: 12/07/2015 Document Reviewed: 02/20/2015 Elsevier Interactive Patient Education  2017 [place] Prevention in the Home Falls can cause injuries and can affect people from all age groups. There are many simple things that you can do to make your home safe and to help prevent falls. What can I do on the outside of my home?  Regularly repair the edges of walkways and driveways and fix any cracks.  Remove high doorway thresholds.  Trim any shrubbery on the main path into your home.  Use bright outdoor lighting.  Clear walkways of debris and clutter, including tools and rocks.  Regularly check that handrails are securely fastened and in good repair. Both sides of any steps should have handrails.  Install guardrails along the edges of any raised decks or porches.  Have leaves, snow, and ice cleared regularly.  Use sand or salt on walkways during winter months.  In the garage, clean up any spills right away, including grease or oil spills. What can I do in the bathroom?  Use night lights.  Install grab bars by the toilet and in the tub and shower. Do not use towel bars as grab  bars.  Use non-skid mats or decals on the floor of the tub or shower.  If you need to sit down while you are in the shower, use a plastic, non-slip stool.  Keep the floor dry. Immediately clean up any water that spills on the floor.  Remove soap buildup in the tub or shower on a regular basis.  Attach bath mats securely with double-sided non-slip rug tape.  Remove throw rugs and other tripping hazards from the floor. What can I do in the bedroom?  Use night lights.  Make sure that a bedside light is easy to reach.  Do not use oversized bedding that drapes onto the floor.  Have a firm chair that has side arms to use for getting dressed.  Remove throw rugs and other tripping hazards from the floor. What can I do in the kitchen?  Clean up any spills right away.  Avoid walking on wet floors.  Place frequently used items in easy-to-reach places.  If you need to reach for something above you, use a sturdy step stool that has a grab bar.  Keep electrical cables out of the way.  Do not use floor polish or wax that makes floors slippery. If you have to use wax, make sure that it is non-skid floor wax.  Remove throw rugs and other tripping hazards from the floor. What can I do in the stairways?  Do not leave any items on the stairs.  Make sure that there are handrails on both sides of the stairs. Fix handrails that are broken or loose. Make sure that handrails are as long as the stairways.  Check any carpeting to make sure that it is firmly attached to the stairs. Fix any carpet that is loose or worn.  Avoid having throw rugs at the top or bottom of stairways, or secure the rugs with  carpet tape to prevent them from moving.  Make sure that you have a light switch at the top of the stairs and the bottom of the stairs. If you do not have them, have them installed. What are some other fall prevention tips?  Wear closed-toe shoes that fit well and support your feet. Wear shoes  that have rubber soles or low heels.  When you use a stepladder, make sure that it is completely opened and that the sides are firmly locked. Have someone hold the ladder while you are using it. Do not climb a closed stepladder.  Add color or contrast paint or tape to grab bars and handrails in your home. Place contrasting color strips on the first and last steps.  Use mobility aids as needed, such as canes, walkers, scooters, and crutches.  Turn on lights if it is dark. Replace any light bulbs that burn out.  Set up furniture so that there are clear paths. Keep the furniture in the same spot.  Fix any uneven floor surfaces.  Choose a carpet design that does not hide the edge of steps of a stairway.  Be aware of any and all pets.  Review your medicines with your healthcare provider. Some medicines can cause dizziness or changes in blood pressure, which increase your risk of falling. Talk with your health care provider about other ways that you can decrease your risk of falls. This may include working with a physical therapist or trainer to improve your strength, balance, and endurance. This information is not intended to replace advice given to you by your health care provider. Make sure you discuss any questions you have with your health care provider. Document Released: 05/09/2002 Document Revised: 10/16/2015 Document Reviewed: 06/23/2014 Elsevier Interactive Patient Education  2017 [employer].

## 2016-08-06 NOTE — Progress Notes (Signed)
Pre visit review using our clinic review tool, if applicable. No additional management support is needed unless otherwise documented below in the visit note. 

## 2016-08-07 ENCOUNTER — Ambulatory Visit (INDEPENDENT_AMBULATORY_CARE_PROVIDER_SITE_OTHER): Payer: Medicare Other | Admitting: Family Medicine

## 2016-08-07 ENCOUNTER — Encounter: Payer: Self-pay | Admitting: Family Medicine

## 2016-08-07 VITALS — BP 120/60 | HR 88 | Resp 12 | Ht 62.0 in | Wt 176.2 lb

## 2016-08-07 DIAGNOSIS — R0981 Nasal congestion: Secondary | ICD-10-CM

## 2016-08-07 DIAGNOSIS — K120 Recurrent oral aphthae: Secondary | ICD-10-CM | POA: Diagnosis not present

## 2016-08-07 DIAGNOSIS — I129 Hypertensive chronic kidney disease with stage 1 through stage 4 chronic kidney disease, or unspecified chronic kidney disease: Secondary | ICD-10-CM | POA: Diagnosis not present

## 2016-08-07 DIAGNOSIS — Z6832 Body mass index (BMI) 32.0-32.9, adult: Secondary | ICD-10-CM

## 2016-08-07 DIAGNOSIS — Z23 Encounter for immunization: Secondary | ICD-10-CM

## 2016-08-07 DIAGNOSIS — N189 Chronic kidney disease, unspecified: Secondary | ICD-10-CM

## 2016-08-07 DIAGNOSIS — J309 Allergic rhinitis, unspecified: Secondary | ICD-10-CM | POA: Diagnosis not present

## 2016-08-07 MED ORDER — AZELASTINE HCL 0.1 % NA SOLN: 1.0000 | Freq: Two times a day (BID) | NASAL | 6 refills | Status: AC

## 2016-08-07 MED ORDER — LIDOCAINE VISCOUS 2 % MT SOLN: 2.0000 mL | Freq: Three times a day (TID) | OROMUCOSAL | 0 refills | Status: AC | PRN

## 2016-08-07 NOTE — Progress Notes (Signed)
Subjective:   [name] is a 76 y.o. female who presents for Medicare Annual (Subsequent) preventive examination.  The Patient was informed that the wellness visit is to identify future health risk and educate and initiate measures that can reduce risk for increased disease through the lifespan.     Medicare Wellness Visit with [name] Support in the home   Describes health as good, fair or great? good   Preventive Screening -Counseling & Management  [facility]; [facility]  Dexa and will send the order to [facility]  Flu vaccine; had in dialysis in the fall  (colonoscopy due 08/2018)   Smoking history Cutting back and trying to quit; .5 packs now 30 pack hx: Educated regarding LDCT if applicable with a 30 year hx if she quits we can refer   Smokeless tobacco / no Second Hand Smoke status; No Smokers in the home no ETOH no  Medication adherence or issues? Defer due to md review today  RISK FACTORS Diet BMI 32 Eats breakfast;  Mostly snack and Dinner;  Conservation officer, historic buildings and cup of coffee; decaf During lunch; has nabs during dialysis Dinner may be piece of meat. Trying to lose weight  Loves vegetable; cabbage; etc;  Can eat string beans; cabbage;  Can eat certain fruit but does not eat fruit    Regular exercise  Does hurt to walk Takes care of niece x 25 years; has to have assistance w food and clothes On dialysis; x 4 years ago  cooked breakfast and did house work (food give away- assist in giving food from [employer])  Words for "out of the garden project.[employer]". Volunteers numerous day during the month to give our food    Cardiac Risk Factors:  Advanced aged  >34 in women Hyperlipidemia HDL low; but busy all day/ LDL is 76 Diabetes no Family History  Obesity 32 working on losing weight To fup with dietcican  Fall risk  Given education on "Fall Prevention in the Home" for more safety tips the patient can apply as appropriate.  Long term goal is to "age  in place" or undecided   Mobility of Functional changes this year? no Safety; community, wears sunscreen, safe place for firearms; Motor vehicle accidents;   Mental Health:  Any emotional problems? Anxious, depressed, irritable, sad or blue? No  Denies feeling depressed or hopeless; voices pleasure in daily life How many social activities have you been engaged in within the last 2 weeks? no    Activities of Daily Living - See functional screen   Cognitive testing; Ad8 score; 0 or less than 2  MMSE deferred or completed if AD8 + 2 issues  Advanced Directives  In process   Patient Care Team: Brianna G Martinique, MD as PCP - General (Family Medicine) Estanislado Emms, MD (Nephrology) Coral Spikes, DO (Pediatrics)       Objective:     Vitals: BP 120/60 (Cuff Size: Normal)   Pulse 88   Resp 12   Ht 5\' 2"  (1.575 m)   Wt 176 lb 4 oz (79.9 kg)   BMI 32.24 kg/m   Body mass index is 32.24 kg/m.   Tobacco History  Smoking Status  . Current Every Day Smoker  . Packs/day: 1.00  . Years: 60.00  . Types: Cigarettes  . Start date: 06/16/2014  . Last attempt to quit: 05/31/2015  Smokeless Tobacco  . Never Used    Comment: smoker since 76 yo.  1.5 ppd of  [place] 100 lights.      Ready to quit: No Counseling given: Yes   Past Medical History:  Diagnosis Date  . Abnormality of gait 03/28/2015  . Adrenal mass ([place])   . 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 Arthritis   . Back pain   . CHF (congestive heart failure) ([place])   . Chronic kidney disease    Hemo MWF  . Constipation   . COPD (chronic obstructive pulmonary disease) ([place])   . Depression   . Dialysis patient [facility])    kidney  . Diverticulitis   . GERD (gastroesophageal reflux disease)   . H/O hiatal hernia   . High cholesterol   . Hyperlipidemia   . Hypertension   . IBS (irritable bowel syndrome)   . Meralgia paresthetica of right side 12/26/2014  . Normal cardiac stress test  12/24/2009   lexiscan, imaging normal  . Renal disorder   . RLS (restless legs syndrome)   . Seizures ([place])    2004  . Sinus complaint   . Thyroid disease   . TIA (transient ischemic attack)   . Tubular adenoma of colon 01/2008   Past Surgical History:  Procedure Laterality Date  . ABDOMINAL HYSTERECTOMY    . ANAL RECTAL MANOMETRY N/A 07/25/2015   Procedure: ANO RECTAL MANOMETRY;  Surgeon: Mauri Pole, MD;  Location: [facility];  Service: Endoscopy;  Laterality: N/A;  . APPENDECTOMY    . AV FISTULA PLACEMENT Left 11/11/2012   Procedure: INSERTION OF ARTERIOVENOUS (AV) GORE-TEX GRAFT ARM;  Surgeon: Angelia Mould, MD;  Location: [place];  Service: Vascular;  Laterality: Left;  . [place] REMOVAL Left 11/18/2012   Procedure: REMOVAL OF LEFT UPPER ARM ARTERIOVENOUS GORETEX GRAFT ([place]);  Surgeon: Angelia Mould, MD;  Location: [place];  Service: Vascular;  Laterality: Left;  . CARDIAC CATHETERIZATION  2003   normal  . CHOLECYSTECTOMY     Open mid line incision  . frontal cranialcity  2002  . frontal craniotomy  2002   indication = sinusitis  . INSERTION OF DIALYSIS CATHETER     Left  . PATCH ANGIOPLASTY Left 11/18/2012   Procedure: PATCH ANGIOPLASTY;  Surgeon: Angelia Mould, MD;  Location: [place];  Service: Vascular;  Laterality: Left;  . PERIPHERAL VASCULAR CATHETERIZATION Left 03/08/2015   Procedure: A/V Shuntogram/Fistulagram;  Surgeon: Algernon Huxley, MD;  Location: [place] [facility];  Service: Cardiovascular;  Laterality: Left;  . PERIPHERAL VASCULAR CATHETERIZATION Left 03/08/2015   Procedure: A/V Shunt Intervention;  Surgeon: Algernon Huxley, MD;  Location: [place] [facility];  Service: Cardiovascular;  Laterality: Left;  . RECTAL ULTRASOUND N/A 10/05/2015   Procedure: ANAL ULTRASOUND WITH PROBE;  Surgeon: Leighton Ruff, MD;  [facility];  Service: Endoscopy;  Laterality: N/A;  . REVISON OF ARTERIOVENOUS FISTULA  05/11/2012   Procedure: REVISON OF  ARTERIOVENOUS FISTULA;  Surgeon: Elam Dutch, MD;  Location: [place];  Service: Vascular;  Laterality: Right;  . TUBAL LIGATION     Family History  Problem Relation Age of Onset  . Thyroid cancer Mother   . Heart disease Father   . Hypertension Father   . Heart attack Father   . Lupus Daughter   . Breast cancer Sister   . Thyroid cancer Sister    History  Sexual Activity  . Sexual activity: No    Outpatient Encounter Prescriptions as of 08/07/2016  Medication Sig  .  acetaminophen (TYLENOL) 500 MG tablet Take 1,000 mg by mouth every 6 (six) hours as needed for mild pain or moderate pain.  Marland Kitchen albuterol (PROVENTIL HFA;VENTOLIN HFA) 108 (90 Base) MCG/ACT inhaler Inhale 2 puffs into the lungs every 4 (four) hours as needed for wheezing or shortness of breath.  . allopurinol (ZYLOPRIM) 100 MG tablet Take 100 mg by mouth once daily on non-dialysis days (Tues/Thurs/Sat/Sun)  . Aluminum & Magnesium Hydroxide 600-300 MG/5ML CONC Take 5 mLs by mouth every 6 (six) hours as needed for indigestion.  Marland Kitchen amLODipine (NORVASC) 10 MG tablet Take 1 tablet (10 mg total) by mouth at bedtime.  . ARIPiprazole (ABILIFY) 2 MG tablet Take 0.5 tablets (1 mg total) by mouth daily.  Marland Kitchen aspirin EC 81 MG tablet Take 81 mg by mouth every morning.  Marland Kitchen atorvastatin (LIPITOR) 40 MG tablet Take 1 tablet (40 mg total) by mouth daily.  . cloNIDine (CATAPRES) 0.2 MG tablet Take 1 tablet (0.2 mg total) by mouth 3 (three) times daily.  . DULoxetine (CYMBALTA) 30 MG capsule Take 1 capsule (30 mg total) by mouth daily.  . fluticasone (FLONASE) 50 MCG/ACT nasal spray Place 2 sprays into both nostrils daily as needed for allergies or rhinitis.  . Homeopathic Products (LEG CRAMP RELIEF PO) Take 1-2 tablets by mouth as needed (for leg pain).  . hydrALAZINE (APRESOLINE) 10 MG tablet Take 1 tablet (10 mg total) by mouth 2 (two) times daily.  Marland Kitchen ipratropium (ATROVENT HFA) 17 MCG/ACT inhaler Inhale 2 puffs into the lungs every 4 (four)  hours as needed for wheezing.  . lidocaine-prilocaine (EMLA) cream Apply 1 application topically 3 (three) times a week. Use before dialysis on MWF  . loratadine (CLARITIN) 10 MG tablet Take 1 tablet (10 mg total) by mouth daily as needed for allergies.  Marland Kitchen losartan (COZAAR) 100 MG tablet Take 1 tablet (100 mg total) by mouth daily.  . metoprolol succinate (TOPROL-XL) 100 MG 24 hr tablet Take 100 mg by mouth at bedtime. Take with or immediately following a meal.  . metoprolol succinate (TOPROL-XL) 50 MG 24 hr tablet Take 50 mg by mouth at bedtime.  . multivitamin (RENA-VIT) TABS tablet Take 1 tablet by mouth at bedtime. (Patient taking differently: Take 1 tablet by mouth every morning. )  . omeprazole (PRILOSEC) 40 MG capsule Take 1 capsule (40 mg total) by mouth 2 (two) times daily.  . pregabalin (LYRICA) 100 MG capsule Take 100 mg by mouth every evening.   Marland Kitchen RENVELA 800 MG tablet   . SENSIPAR 90 MG tablet   . azelastine (ASTELIN) 0.1 % nasal spray Place 1 spray into both nostrils 2 (two) times daily. Use in each nostril as directed  . lidocaine (XYLOCAINE) 2 % solution Use as directed 2 mLs in the mouth or throat every 8 (eight) hours as needed for mouth pain (aphthas).   No facility-administered encounter medications on file as of 08/07/2016.     Activities of Daily Living In your present state of health, do you have any difficulty performing the following activities: 04/19/2016  Hearing? N  Vision? N  Difficulty concentrating or making decisions? N  Walking or climbing stairs? N  Dressing or bathing? N  Doing errands, shopping? N  Some recent data might be hidden    Patient Care Team: Brianna G Martinique, MD as PCP - General (Family Medicine) Estanislado Emms, MD (Nephrology) Coral Spikes, DO (Pediatrics)    Assessment:   Exercise Activities and Dietary recommendations  Goals    None     Fall Risk Fall Risk  11/22/2015 11/02/2015 09/20/2015 09/18/2015 06/21/2015  Falls in the past  year? No No No No No  Risk for fall due to : - - - - -   Depression Screen PHQ 2/9 Scores 11/22/2015 11/02/2015 09/20/2015 09/18/2015  PHQ - 2 Score 0 0 0 0  PHQ- 9 Score - - - -     Cognitive Function        Immunization History  Administered Date(s) Administered  . Influenza Split 03/03/2012  . Influenza Whole 03/10/2008, 03/20/2009  . Influenza,inj,Quad PF,36+ Mos 03/13/2014, 02/26/2015  . Td 03/02/2002   Screening Tests Health Maintenance  Topic Date Due  . DEXA SCAN  12/02/2006  . PNA vac Low Risk Adult (1 of 2 - PCV13) 12/02/2006  . MAMMOGRAM  08/12/2015  . INFLUENZA VACCINE  01/01/2016  . TETANUS/TDAP  08/01/2018  . COLONOSCOPY  08/28/2018      Plan:     PCP Notes  Health Maintenance Mammogram; [facility] - [facility]  Dexa and will [facility] the order to [facility]  Flu vaccine; had in dialysis in the fall  Entered flu vaccine in epic  Manuela Schwartz to order Dexa and will call [facility] to schedule mammogram and dexa   The patient will check on dialysis to be sure she rec'd her [place] and if not, she will make an nurse visit apt to have this done  Abnormal Screens no  Referrals ; the patient will schedule an apt with the dietician at the dialysis center to be sure she is getting the nutrients she needs Diet is limited and challenging due to renal failure   Patient concerns; none  Nurse Concerns; none  Next PCP apt   During the course of the visit the patient was educated and counseled about the following appropriate screening and preventive services:   Vaccines to include Pneumoccal, Influenza, Hepatitis B, Td, Zostavax, HCV  Electrocardiogram  Cardiovascular Disease neg; states Chest pain was due to GI issues   Colorectal cancer screening due 2020  Bone density screening to schedule  Diabetes screening deferred md  Glaucoma screening  Mammography/to be scheduled  Nutrition counseling referred to dietician at the dialysis center   Patient Instructions (the written  plan) was given to the patient.   Wynetta Fines, RN  08/07/2016

## 2016-08-08 ENCOUNTER — Other Ambulatory Visit: Payer: Self-pay

## 2016-08-08 ENCOUNTER — Telehealth: Payer: Self-pay

## 2016-08-08 DIAGNOSIS — N2581 Secondary hyperparathyroidism of renal origin: Secondary | ICD-10-CM | POA: Diagnosis not present

## 2016-08-08 DIAGNOSIS — Z1231 Encounter for screening mammogram for malignant neoplasm of breast: Secondary | ICD-10-CM

## 2016-08-08 DIAGNOSIS — Z780 Asymptomatic menopausal state: Secondary | ICD-10-CM

## 2016-08-08 DIAGNOSIS — D631 Anemia in chronic kidney disease: Secondary | ICD-10-CM | POA: Diagnosis not present

## 2016-08-08 DIAGNOSIS — N186 End stage renal disease: Secondary | ICD-10-CM | POA: Diagnosis not present

## 2016-08-08 NOTE — Telephone Encounter (Signed)
Mammogram and Bone density signed and sent to [facility] via fax

## 2016-08-09 MED ORDER — LORATADINE 10 MG PO TABS: 10.0000 mg | ORAL_TABLET | Freq: Every day | ORAL | 3 refills | Status: DC | PRN

## 2016-08-09 MED ORDER — HYDRALAZINE HCL 10 MG PO TABS: 10.0000 mg | ORAL_TABLET | Freq: Two times a day (BID) | ORAL | 5 refills | Status: DC

## 2016-08-11 DIAGNOSIS — N186 End stage renal disease: Secondary | ICD-10-CM | POA: Diagnosis not present

## 2016-08-11 DIAGNOSIS — D631 Anemia in chronic kidney disease: Secondary | ICD-10-CM | POA: Diagnosis not present

## 2016-08-11 DIAGNOSIS — N2581 Secondary hyperparathyroidism of renal origin: Secondary | ICD-10-CM | POA: Diagnosis not present

## 2016-08-13 DIAGNOSIS — N186 End stage renal disease: Secondary | ICD-10-CM | POA: Diagnosis not present

## 2016-08-13 DIAGNOSIS — D631 Anemia in chronic kidney disease: Secondary | ICD-10-CM | POA: Diagnosis not present

## 2016-08-13 DIAGNOSIS — N2581 Secondary hyperparathyroidism of renal origin: Secondary | ICD-10-CM | POA: Diagnosis not present

## 2016-08-13 NOTE — Progress Notes (Signed)
I have reviewed documentation on this Medicare preventive visit done by Ms Petra Kuba and I agree with recommendations given.  Betty G. Martinique, MD  [facility]. [place] office.

## 2016-08-15 DIAGNOSIS — N2581 Secondary hyperparathyroidism of renal origin: Secondary | ICD-10-CM | POA: Diagnosis not present

## 2016-08-15 DIAGNOSIS — D631 Anemia in chronic kidney disease: Secondary | ICD-10-CM | POA: Diagnosis not present

## 2016-08-15 DIAGNOSIS — N186 End stage renal disease: Secondary | ICD-10-CM | POA: Diagnosis not present

## 2016-08-18 DIAGNOSIS — N186 End stage renal disease: Secondary | ICD-10-CM | POA: Diagnosis not present

## 2016-08-18 DIAGNOSIS — N2581 Secondary hyperparathyroidism of renal origin: Secondary | ICD-10-CM | POA: Diagnosis not present

## 2016-08-18 DIAGNOSIS — D631 Anemia in chronic kidney disease: Secondary | ICD-10-CM | POA: Diagnosis not present

## 2016-08-20 DIAGNOSIS — D631 Anemia in chronic kidney disease: Secondary | ICD-10-CM | POA: Diagnosis not present

## 2016-08-20 DIAGNOSIS — N186 End stage renal disease: Secondary | ICD-10-CM | POA: Diagnosis not present

## 2016-08-20 DIAGNOSIS — N2581 Secondary hyperparathyroidism of renal origin: Secondary | ICD-10-CM | POA: Diagnosis not present

## 2016-08-22 DIAGNOSIS — N186 End stage renal disease: Secondary | ICD-10-CM | POA: Diagnosis not present

## 2016-08-22 DIAGNOSIS — D631 Anemia in chronic kidney disease: Secondary | ICD-10-CM | POA: Diagnosis not present

## 2016-08-22 DIAGNOSIS — N2581 Secondary hyperparathyroidism of renal origin: Secondary | ICD-10-CM | POA: Diagnosis not present

## 2016-08-25 DIAGNOSIS — N186 End stage renal disease: Secondary | ICD-10-CM | POA: Diagnosis not present

## 2016-08-25 DIAGNOSIS — D631 Anemia in chronic kidney disease: Secondary | ICD-10-CM | POA: Diagnosis not present

## 2016-08-25 DIAGNOSIS — N2581 Secondary hyperparathyroidism of renal origin: Secondary | ICD-10-CM | POA: Diagnosis not present

## 2016-08-26 DIAGNOSIS — H35033 Hypertensive retinopathy, bilateral: Secondary | ICD-10-CM | POA: Diagnosis not present

## 2016-08-26 DIAGNOSIS — Z961 Presence of intraocular lens: Secondary | ICD-10-CM | POA: Diagnosis not present

## 2016-08-26 DIAGNOSIS — H16213 Exposure keratoconjunctivitis, bilateral: Secondary | ICD-10-CM | POA: Diagnosis not present

## 2016-08-26 DIAGNOSIS — H04123 Dry eye syndrome of bilateral lacrimal glands: Secondary | ICD-10-CM | POA: Diagnosis not present

## 2016-08-27 DIAGNOSIS — D631 Anemia in chronic kidney disease: Secondary | ICD-10-CM | POA: Diagnosis not present

## 2016-08-27 DIAGNOSIS — N186 End stage renal disease: Secondary | ICD-10-CM | POA: Diagnosis not present

## 2016-08-27 DIAGNOSIS — N2581 Secondary hyperparathyroidism of renal origin: Secondary | ICD-10-CM | POA: Diagnosis not present

## 2016-08-29 DIAGNOSIS — D631 Anemia in chronic kidney disease: Secondary | ICD-10-CM | POA: Diagnosis not present

## 2016-08-29 DIAGNOSIS — N186 End stage renal disease: Secondary | ICD-10-CM | POA: Diagnosis not present

## 2016-08-29 DIAGNOSIS — N2581 Secondary hyperparathyroidism of renal origin: Secondary | ICD-10-CM | POA: Diagnosis not present

## 2016-08-30 DIAGNOSIS — Z992 Dependence on renal dialysis: Secondary | ICD-10-CM | POA: Diagnosis not present

## 2016-08-30 DIAGNOSIS — N186 End stage renal disease: Secondary | ICD-10-CM | POA: Diagnosis not present

## 2016-08-30 DIAGNOSIS — E1129 Type 2 diabetes mellitus with other diabetic kidney complication: Secondary | ICD-10-CM | POA: Diagnosis not present

## 2016-09-01 DIAGNOSIS — N186 End stage renal disease: Secondary | ICD-10-CM | POA: Diagnosis not present

## 2016-09-01 DIAGNOSIS — D631 Anemia in chronic kidney disease: Secondary | ICD-10-CM | POA: Diagnosis not present

## 2016-09-01 DIAGNOSIS — N2581 Secondary hyperparathyroidism of renal origin: Secondary | ICD-10-CM | POA: Diagnosis not present

## 2016-09-02 DIAGNOSIS — M85851 Other specified disorders of bone density and structure, right thigh: Secondary | ICD-10-CM | POA: Diagnosis not present

## 2016-09-02 DIAGNOSIS — Z780 Asymptomatic menopausal state: Secondary | ICD-10-CM | POA: Diagnosis not present

## 2016-09-02 DIAGNOSIS — Z803 Family history of malignant neoplasm of breast: Secondary | ICD-10-CM | POA: Diagnosis not present

## 2016-09-02 DIAGNOSIS — Z1231 Encounter for screening mammogram for malignant neoplasm of breast: Secondary | ICD-10-CM | POA: Diagnosis not present

## 2016-09-02 LAB — HM DEXA SCAN

## 2016-09-02 LAB — HM MAMMOGRAPHY

## 2016-09-05 DIAGNOSIS — N2581 Secondary hyperparathyroidism of renal origin: Secondary | ICD-10-CM | POA: Diagnosis not present

## 2016-09-05 DIAGNOSIS — N186 End stage renal disease: Secondary | ICD-10-CM | POA: Diagnosis not present

## 2016-09-05 DIAGNOSIS — D631 Anemia in chronic kidney disease: Secondary | ICD-10-CM | POA: Diagnosis not present

## 2016-09-08 ENCOUNTER — Encounter: Payer: Self-pay | Admitting: Family Medicine

## 2016-09-08 DIAGNOSIS — M8580 Other specified disorders of bone density and structure, unspecified site: Secondary | ICD-10-CM

## 2016-09-08 DIAGNOSIS — D631 Anemia in chronic kidney disease: Secondary | ICD-10-CM | POA: Diagnosis not present

## 2016-09-08 DIAGNOSIS — N2581 Secondary hyperparathyroidism of renal origin: Secondary | ICD-10-CM | POA: Diagnosis not present

## 2016-09-08 DIAGNOSIS — N186 End stage renal disease: Secondary | ICD-10-CM | POA: Diagnosis not present

## 2016-09-10 DIAGNOSIS — D631 Anemia in chronic kidney disease: Secondary | ICD-10-CM | POA: Diagnosis not present

## 2016-09-10 DIAGNOSIS — N186 End stage renal disease: Secondary | ICD-10-CM | POA: Diagnosis not present

## 2016-09-10 DIAGNOSIS — N2581 Secondary hyperparathyroidism of renal origin: Secondary | ICD-10-CM | POA: Diagnosis not present

## 2016-09-12 ENCOUNTER — Encounter: Payer: Self-pay | Admitting: Family Medicine

## 2016-09-12 DIAGNOSIS — N186 End stage renal disease: Secondary | ICD-10-CM | POA: Diagnosis not present

## 2016-09-12 DIAGNOSIS — N2581 Secondary hyperparathyroidism of renal origin: Secondary | ICD-10-CM | POA: Diagnosis not present

## 2016-09-12 DIAGNOSIS — D631 Anemia in chronic kidney disease: Secondary | ICD-10-CM | POA: Diagnosis not present

## 2016-09-15 DIAGNOSIS — N2581 Secondary hyperparathyroidism of renal origin: Secondary | ICD-10-CM | POA: Diagnosis not present

## 2016-09-15 DIAGNOSIS — N186 End stage renal disease: Secondary | ICD-10-CM | POA: Diagnosis not present

## 2016-09-15 DIAGNOSIS — D631 Anemia in chronic kidney disease: Secondary | ICD-10-CM | POA: Diagnosis not present

## 2016-09-17 DIAGNOSIS — N186 End stage renal disease: Secondary | ICD-10-CM | POA: Diagnosis not present

## 2016-09-17 DIAGNOSIS — N2581 Secondary hyperparathyroidism of renal origin: Secondary | ICD-10-CM | POA: Diagnosis not present

## 2016-09-17 DIAGNOSIS — D631 Anemia in chronic kidney disease: Secondary | ICD-10-CM | POA: Diagnosis not present

## 2016-09-19 DIAGNOSIS — N2581 Secondary hyperparathyroidism of renal origin: Secondary | ICD-10-CM | POA: Diagnosis not present

## 2016-09-19 DIAGNOSIS — N186 End stage renal disease: Secondary | ICD-10-CM | POA: Diagnosis not present

## 2016-09-19 DIAGNOSIS — D631 Anemia in chronic kidney disease: Secondary | ICD-10-CM | POA: Diagnosis not present

## 2016-09-22 DIAGNOSIS — N2581 Secondary hyperparathyroidism of renal origin: Secondary | ICD-10-CM | POA: Diagnosis not present

## 2016-09-22 DIAGNOSIS — N186 End stage renal disease: Secondary | ICD-10-CM | POA: Diagnosis not present

## 2016-09-22 DIAGNOSIS — D631 Anemia in chronic kidney disease: Secondary | ICD-10-CM | POA: Diagnosis not present

## 2016-09-24 DIAGNOSIS — N2581 Secondary hyperparathyroidism of renal origin: Secondary | ICD-10-CM | POA: Diagnosis not present

## 2016-09-24 DIAGNOSIS — N186 End stage renal disease: Secondary | ICD-10-CM | POA: Diagnosis not present

## 2016-09-24 DIAGNOSIS — D631 Anemia in chronic kidney disease: Secondary | ICD-10-CM | POA: Diagnosis not present

## 2016-09-26 DIAGNOSIS — N2581 Secondary hyperparathyroidism of renal origin: Secondary | ICD-10-CM | POA: Diagnosis not present

## 2016-09-26 DIAGNOSIS — N186 End stage renal disease: Secondary | ICD-10-CM | POA: Diagnosis not present

## 2016-09-26 DIAGNOSIS — D631 Anemia in chronic kidney disease: Secondary | ICD-10-CM | POA: Diagnosis not present

## 2016-09-29 DIAGNOSIS — N2581 Secondary hyperparathyroidism of renal origin: Secondary | ICD-10-CM | POA: Diagnosis not present

## 2016-09-29 DIAGNOSIS — N186 End stage renal disease: Secondary | ICD-10-CM | POA: Diagnosis not present

## 2016-09-29 DIAGNOSIS — E1129 Type 2 diabetes mellitus with other diabetic kidney complication: Secondary | ICD-10-CM | POA: Diagnosis not present

## 2016-09-29 DIAGNOSIS — D631 Anemia in chronic kidney disease: Secondary | ICD-10-CM | POA: Diagnosis not present

## 2016-09-29 DIAGNOSIS — Z992 Dependence on renal dialysis: Secondary | ICD-10-CM | POA: Diagnosis not present

## 2016-09-30 ENCOUNTER — Ambulatory Visit (INDEPENDENT_AMBULATORY_CARE_PROVIDER_SITE_OTHER): Payer: Medicare Other | Admitting: Sports Medicine

## 2016-09-30 ENCOUNTER — Encounter: Payer: Self-pay | Admitting: Sports Medicine

## 2016-09-30 DIAGNOSIS — Z9889 Other specified postprocedural states: Secondary | ICD-10-CM | POA: Diagnosis not present

## 2016-09-30 DIAGNOSIS — M79674 Pain in right toe(s): Secondary | ICD-10-CM | POA: Diagnosis not present

## 2016-09-30 DIAGNOSIS — M79675 Pain in left toe(s): Secondary | ICD-10-CM | POA: Diagnosis not present

## 2016-09-30 NOTE — Progress Notes (Signed)
Subjective: [name] is a 76 y.o. female patient returns to office today for follow up evaluation after having Right and Left Hallux total permanent nail avulsion performed on 06-10-16. Patient has been soaking using epsom salt and applying topical antibiotic covered with bandaid daily; states that pain is improving only get occassional pain; Reports toes look horrible. Patient denies fever/chills/nausea/vomitting/any other related constitutional symptoms at this time.  Patient Active Problem List   Diagnosis Date Noted  . Osteopenia 09/08/2016  . Allergic rhinitis 08/07/2016  . BMI 32.0-32.9,adult 08/07/2016  . Onychomycosis 05/09/2016  . Nonspecific chest pain 04/19/2016  . ACS (acute coronary syndrome) ([place]) 04/19/2016  . Leg pain, bilateral 12/05/2015  . Trouble in sleeping 11/22/2015  . End stage renal disease ([place])   . Hyperkalemia 11/12/2015  . ESRD on dialysis ([place])   . Chronic obstructive pulmonary disease ([place])   . Hypertensive urgency 09/20/2015  . Fecal incontinence   . Adverse effects of medication 06/21/2015  . Herpes 06/19/2015  . Skin lesion 06/16/2015  . Recurrent genital herpes 06/16/2015  . Onychocryptosis 04/08/2015  . Renal mass   . End stage renal disease on dialysis ([place])   . Inadequate pain control 12/30/2014  . CHF (congestive heart failure) ([place]) 12/30/2014  . Meralgia paresthetica of right side 12/26/2014  . Hereditary and idiopathic peripheral neuropathy 02/15/2013  . Dermatitis of face 08/19/2012  . HIP PAIN, BILATERAL 12/07/2008  . BLADDER PROLAPSE 11/17/2007  . OSTEOARTHRITIS, GENERALIZED, MULTIPLE JOINTS 08/18/2007  . 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 ANXIETY STATE, UNSPECIFIED 04/23/2007  . ANEMIA NEC 03/31/2007  . TOBACCO ABUSE 12/10/2006  . HIATAL HERNIA 12/10/2006  . Dysphagia, unspecified(787.20) 12/10/2006  . HYPERCHOLESTEROLEMIA 07/30/2006  . Gout, unspecified 07/30/2006  . Major depression in partial  remission ([place]) 07/30/2006  . Hypertensive renal disease, malignant, with renal failure 07/30/2006  . GASTROESOPHAGEAL REFLUX, NO ESOPHAGITIS 07/30/2006    Current Outpatient Prescriptions on File Prior to Visit  Medication Sig Dispense Refill  . acetaminophen (TYLENOL) 500 MG tablet Take 1,000 mg by mouth every 6 (six) hours as needed for mild pain or moderate pain.    Marland Kitchen albuterol (PROVENTIL HFA;VENTOLIN HFA) 108 (90 Base) MCG/ACT inhaler Inhale 2 puffs into the lungs every 4 (four) hours as needed for wheezing or shortness of breath. 1 Inhaler 1  . allopurinol (ZYLOPRIM) 100 MG tablet Take 100 mg by mouth once daily on non-dialysis days (Tues/Thurs/Sat/Sun) 30 tablet 0  . Aluminum & Magnesium Hydroxide 600-300 MG/5ML CONC Take 5 mLs by mouth every 6 (six) hours as needed for indigestion. 150 mL 0  . amLODipine (NORVASC) 10 MG tablet Take 1 tablet (10 mg total) by mouth at bedtime. 30 tablet 0  . ARIPiprazole (ABILIFY) 2 MG tablet Take 0.5 tablets (1 mg total) by mouth daily. 30 tablet 6  . aspirin EC 81 MG tablet Take 81 mg by mouth every morning.    Marland Kitchen atorvastatin (LIPITOR) 40 MG tablet Take 1 tablet (40 mg total) by mouth daily. 90 tablet 3  . azelastine (ASTELIN) 0.1 % nasal spray Place 1 spray into both nostrils 2 (two) times daily. Use in each nostril as directed 30 mL 6  . cloNIDine (CATAPRES) 0.2 MG tablet Take 1 tablet (0.2 mg total) by mouth 3 (three) times daily. 90 tablet 0  . DULoxetine (CYMBALTA) 30 MG capsule Take 1 capsule (30 mg total) by mouth daily. 90 capsule 3  . fluticasone (FLONASE) 50 MCG/ACT nasal spray Place 2 sprays into  both nostrils daily as needed for allergies or rhinitis. 16 g 3  . Homeopathic Products (LEG CRAMP RELIEF PO) Take 1-2 tablets by mouth as needed (for leg pain).    . hydrALAZINE (APRESOLINE) 10 MG tablet Take 1 tablet (10 mg total) by mouth 2 (two) times daily. 60 tablet 5  . ipratropium (ATROVENT HFA) 17 MCG/ACT inhaler Inhale 2 puffs into the lungs  every 4 (four) hours as needed for wheezing. 1 Inhaler 12  . lidocaine-prilocaine (EMLA) cream Apply 1 application topically 3 (three) times a week. Use before dialysis on MWF    . loratadine (CLARITIN) 10 MG tablet Take 1 tablet (10 mg total) by mouth daily as needed for allergies. 90 tablet 3  . losartan (COZAAR) 100 MG tablet Take 1 tablet (100 mg total) by mouth daily. 30 tablet 0  . metoprolol succinate (TOPROL-XL) 100 MG 24 hr tablet Take 100 mg by mouth at bedtime. Take with or immediately following a meal.    . metoprolol succinate (TOPROL-XL) 50 MG 24 hr tablet Take 50 mg by mouth at bedtime.    . multivitamin (RENA-VIT) TABS tablet Take 1 tablet by mouth at bedtime. (Patient taking differently: Take 1 tablet by mouth every morning. ) 60 tablet 5  . omeprazole (PRILOSEC) 40 MG capsule Take 1 capsule (40 mg total) by mouth 2 (two) times daily. 60 capsule 11  . pregabalin (LYRICA) 100 MG capsule Take 100 mg by mouth every evening.     Marland Kitchen RENVELA 800 MG tablet     . SENSIPAR 90 MG tablet      No current facility-administered medications on file prior to visit.     Allergies  Allergen Reactions  . Tuberculin Tests Hives    "blisters"  . Valacyclovir Other (See Comments)    Confusion and nervousness  . Codeine Nausea And Vomiting  . Penicillins Rash    No problems breathing. Has tolerated omnicef in past without issue Has patient had a PCN reaction causing immediate rash, facial/tongue/throat swelling, SOB or lightheadedness with hypotension: Yes Has patient had a PCN reaction causing severe rash involving mucus membranes or skin necrosis: No Has patient had a PCN reaction that required hospitalization No Has patient had a PCN reaction occurring within the last 10 years: No If all of the above answers are "NO", then may proceed with Cephalosporin  . Sulfamethoxazole Rash    Objective:  General: Well developed, nourished, in no acute distress, alert and oriented x3   Dermatology:  Skin is warm, dry and supple bilateral. Bilateral hallux nail beds appear to be clean, dry, with surrounding eschar/scab. (-) Erythema. (-) Edema. (-) serosanguous drainage present. The remaining nails appear unremarkable at this time. There are no other lesions or other signs of infection  present.  Neurovascular status: Intact . No lower extremity swelling; No pain with calf compression bilateral.  Musculoskeletal:There is decreased tenderness to palpation of Bilateral hallux nail beds. Muscular strength within normal limits bilateral.   Assesement and Plan: Problem List Items Addressed This Visit    None    Visit Diagnoses    S/P nail surgery    -  Primary   Toe pain, bilateral          -Examined patient  -Nail beds are clean and dry -Patient may alternate and use pain EMLA cream to nail beds as needed -Educated patient on long term care after nail surgery. Advised patient that she will take longer to heal because of her ESRD which  can cause decreased circulation to toes and areas to be slow to heal -Patient was instructed to monitor the toes for reoccurrence and signs of infection; Patient advised to return to office or go to ER if toe becomes red, hot or swollen. -Patient is to return as needed or sooner if problems arise.  Landis Martins, DPM

## 2016-10-01 DIAGNOSIS — D631 Anemia in chronic kidney disease: Secondary | ICD-10-CM | POA: Diagnosis not present

## 2016-10-01 DIAGNOSIS — N2581 Secondary hyperparathyroidism of renal origin: Secondary | ICD-10-CM | POA: Diagnosis not present

## 2016-10-01 DIAGNOSIS — N186 End stage renal disease: Secondary | ICD-10-CM | POA: Diagnosis not present

## 2016-10-03 DIAGNOSIS — D631 Anemia in chronic kidney disease: Secondary | ICD-10-CM | POA: Diagnosis not present

## 2016-10-03 DIAGNOSIS — N186 End stage renal disease: Secondary | ICD-10-CM | POA: Diagnosis not present

## 2016-10-03 DIAGNOSIS — N2581 Secondary hyperparathyroidism of renal origin: Secondary | ICD-10-CM | POA: Diagnosis not present

## 2016-10-06 DIAGNOSIS — N2581 Secondary hyperparathyroidism of renal origin: Secondary | ICD-10-CM | POA: Diagnosis not present

## 2016-10-06 DIAGNOSIS — D631 Anemia in chronic kidney disease: Secondary | ICD-10-CM | POA: Diagnosis not present

## 2016-10-06 DIAGNOSIS — N186 End stage renal disease: Secondary | ICD-10-CM | POA: Diagnosis not present

## 2016-10-08 DIAGNOSIS — N2581 Secondary hyperparathyroidism of renal origin: Secondary | ICD-10-CM | POA: Diagnosis not present

## 2016-10-08 DIAGNOSIS — D631 Anemia in chronic kidney disease: Secondary | ICD-10-CM | POA: Diagnosis not present

## 2016-10-08 DIAGNOSIS — N186 End stage renal disease: Secondary | ICD-10-CM | POA: Diagnosis not present

## 2016-10-10 DIAGNOSIS — D631 Anemia in chronic kidney disease: Secondary | ICD-10-CM | POA: Diagnosis not present

## 2016-10-10 DIAGNOSIS — N2581 Secondary hyperparathyroidism of renal origin: Secondary | ICD-10-CM | POA: Diagnosis not present

## 2016-10-10 DIAGNOSIS — N186 End stage renal disease: Secondary | ICD-10-CM | POA: Diagnosis not present

## 2016-10-13 DIAGNOSIS — N2581 Secondary hyperparathyroidism of renal origin: Secondary | ICD-10-CM | POA: Diagnosis not present

## 2016-10-13 DIAGNOSIS — D631 Anemia in chronic kidney disease: Secondary | ICD-10-CM | POA: Diagnosis not present

## 2016-10-13 DIAGNOSIS — N186 End stage renal disease: Secondary | ICD-10-CM | POA: Diagnosis not present

## 2016-10-15 DIAGNOSIS — N2581 Secondary hyperparathyroidism of renal origin: Secondary | ICD-10-CM | POA: Diagnosis not present

## 2016-10-15 DIAGNOSIS — N186 End stage renal disease: Secondary | ICD-10-CM | POA: Diagnosis not present

## 2016-10-15 DIAGNOSIS — D631 Anemia in chronic kidney disease: Secondary | ICD-10-CM | POA: Diagnosis not present

## 2016-10-16 ENCOUNTER — Ambulatory Visit: Payer: Medicare Other | Admitting: Nurse Practitioner

## 2016-10-16 DIAGNOSIS — H04123 Dry eye syndrome of bilateral lacrimal glands: Secondary | ICD-10-CM | POA: Diagnosis not present

## 2016-10-16 DIAGNOSIS — Z961 Presence of intraocular lens: Secondary | ICD-10-CM | POA: Diagnosis not present

## 2016-10-16 DIAGNOSIS — H16143 Punctate keratitis, bilateral: Secondary | ICD-10-CM | POA: Diagnosis not present

## 2016-10-16 DIAGNOSIS — H16213 Exposure keratoconjunctivitis, bilateral: Secondary | ICD-10-CM | POA: Diagnosis not present

## 2016-10-17 DIAGNOSIS — N2581 Secondary hyperparathyroidism of renal origin: Secondary | ICD-10-CM | POA: Diagnosis not present

## 2016-10-17 DIAGNOSIS — D631 Anemia in chronic kidney disease: Secondary | ICD-10-CM | POA: Diagnosis not present

## 2016-10-17 DIAGNOSIS — N186 End stage renal disease: Secondary | ICD-10-CM | POA: Diagnosis not present

## 2016-10-20 DIAGNOSIS — D631 Anemia in chronic kidney disease: Secondary | ICD-10-CM | POA: Diagnosis not present

## 2016-10-20 DIAGNOSIS — N186 End stage renal disease: Secondary | ICD-10-CM | POA: Diagnosis not present

## 2016-10-20 DIAGNOSIS — N2581 Secondary hyperparathyroidism of renal origin: Secondary | ICD-10-CM | POA: Diagnosis not present

## 2016-10-22 DIAGNOSIS — N2581 Secondary hyperparathyroidism of renal origin: Secondary | ICD-10-CM | POA: Diagnosis not present

## 2016-10-22 DIAGNOSIS — N186 End stage renal disease: Secondary | ICD-10-CM | POA: Diagnosis not present

## 2016-10-22 DIAGNOSIS — D631 Anemia in chronic kidney disease: Secondary | ICD-10-CM | POA: Diagnosis not present

## 2016-10-23 ENCOUNTER — Telehealth: Payer: Self-pay | Admitting: Family Medicine

## 2016-10-23 ENCOUNTER — Ambulatory Visit (INDEPENDENT_AMBULATORY_CARE_PROVIDER_SITE_OTHER): Payer: Medicare Other | Admitting: Nurse Practitioner

## 2016-10-23 ENCOUNTER — Encounter: Payer: Self-pay | Admitting: Nurse Practitioner

## 2016-10-23 VITALS — BP 130/50 | HR 64 | Ht 62.0 in | Wt 180.4 lb

## 2016-10-23 DIAGNOSIS — R1310 Dysphagia, unspecified: Secondary | ICD-10-CM | POA: Diagnosis not present

## 2016-10-23 NOTE — Telephone Encounter (Signed)
Left voicemail for patient to call the office back.   

## 2016-10-23 NOTE — Telephone Encounter (Signed)
There are some OTC products for dry mouth that she can try, Ex Biotene and I think [employer] has another one. Her dentists may also have recommendations in this regard.  Thanks, BJ

## 2016-10-23 NOTE — Telephone Encounter (Signed)
Please advise 

## 2016-10-23 NOTE — Progress Notes (Signed)
HPI: Patient is a 76 year old female with ESRD on HD. She is known to Dr. Fuller Plan for history of GERD, diverticulosis and also adenomatous colon polyps. Her last surveillance colonoscopy was March 2017 at which time multiple tubular adenomatous polyps without HGD were removed.  Patient comes in today for a 1.5 month history of swallowing difficulties. Solid food is getting feels like it is getting stuck in her esophagus. No pain when swallowing, at least until something gets stuck. . Additionally she complains of a hoarse voice, and frequent throat clearing. Her mouth is so dry that it is difficult to even put dentures in . Sometimes saliva so thick it is difficult to swallow. Biotene didn't help. She has a hx of GERD but no heartburn on BID PPI. She sleeps with the head of her bed elevated, goes to bed on an empty stomach.. She complains of excessive belching.  Past Medical History:  Diagnosis Date  . Abnormality of gait 03/28/2015  . Adrenal mass ([place])   . 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 Arthritis   . Back pain   . CHF (congestive heart failure) ([place])   . Chronic kidney disease    Hemo MWF  . Constipation   . COPD (chronic obstructive pulmonary disease) ([place])   . Depression   . Dialysis patient [facility])    kidney  . Diverticulitis   . GERD (gastroesophageal reflux disease)   . H/O hiatal hernia   . High cholesterol   . Hyperlipidemia   . Hypertension   . IBS (irritable bowel syndrome)   . Meralgia paresthetica of right side 12/26/2014  . Normal cardiac stress test 12/24/2009   lexiscan, imaging normal  . Renal disorder   . RLS (restless legs syndrome)   . Seizures ([place])    2004  . Sinus complaint   . Thyroid disease   . TIA (transient ischemic attack)   . Tubular adenoma of colon 01/2008    Patient's surgical history, family medical history, social history, medications and allergies were all reviewed in Epic    Physical Exam: BP  (!) 130/50   Pulse 64   Ht 5\' 2"  (1.575 m)   Wt 180 lb 6.4 oz (81.8 kg)   BMI 33.00 kg/m   GENERAL: pleasant black female in NAD PSYCH: :Pleasant, cooperative, normal affect EENT:  conjunctiva pink, tongue and lips are dry no obvious candida, neck supple without masses CARDIAC:  RRR,  PULM: Normal respiratory effort, lungs CTA bilaterally, no wheezing ABDOMEN:  soft, nontender, nondistended, no obvious masses, no hepatomegaly,  normal bowel sounds SKIN:  turgor, no lesions seen Musculoskeletal: normal muscle tone, normal strength NEURO: Alert and oriented x 3, no focal neurologic deficits  ASSESSMENT and PLAN:  1.  76 yo female with 1.5 month history of solid food dysphagia. Complains of extremely dry mouth, saliva  Thick,hard to swallow. Tried biotene.  -Her dry mouth / throat is probably contributing to the dysphagia. She is on a fluid restriction because of ESRD so I didn't advise more fluid intake. She states her BP today is much lower than her normal. She just had a dry weight adjustment. I asked her to discuss these things with her Nephrologist. As far as the dysphagia, it is reasonable to obtain a barium swallow with tablet. If stricture present then she will need an EGD.   2. Belching. Reassurance provided. Usually benign problem. Discussed some of  the more common reasons for burping including carbonated drinks., straws, etc...   Tye Savoy , NP 10/23/2016, 11:47 AM

## 2016-10-23 NOTE — Telephone Encounter (Signed)
Pt calling stating that she has gone to her GI and they state that she is very dry and her mouth is very dry she can hardly get her dentures in she has tried OT biotene.  Pt would like to see if Dr. Martinique can recommend something for her dry mouth and would like to have something before the weekend.  Pharm:  [facility]

## 2016-10-23 NOTE — Patient Instructions (Addendum)
You have been given a separate informational sheet regarding your tobacco use, the importance of quitting and local resources to help you quit.   If you are age 75 or older, your body mass index should be between 23-30. Your Body mass index is 33 kg/m. If this is out of the aforementioned range listed, please consider follow up with your Primary Care Provider.  If you are age 24 or younger, your body mass index should be between 19-25. Your Body mass index is 33 kg/m. If this is out of the aformentioned range listed, please consider follow up with your Primary Care Provider.   You have been scheduled for a Barium Esophogram at [facility] (1st floor of the hospital) on 10/28/16 at 1130 am. Please arrive 15 minutes prior to your appointment for registration. Make certain not to have anything to eat or drink 3 hours prior to your test. If you need to reschedule for any reason, please contact radiology at [phone] to do so. __________________________________________________________________ A barium swallow is an examination that concentrates on views of the esophagus. This tends to be a double contrast exam (barium and two liquids which, when combined, create a gas to distend the wall of the oesophagus) or single contrast (non-ionic iodine based). The study is usually tailored to your symptoms so a good history is essential. Attention is paid during the study to the form, structure and configuration of the esophagus, looking for functional disorders (such as aspiration, dysphagia, achalasia, motility and reflux) EXAMINATION You may be asked to change into a gown, depending on the type of swallow being performed. A radiologist and radiographer will perform the procedure. The radiologist will advise you of the type of contrast selected for your procedure and direct you during the exam. You will be asked to stand, sit or lie in several different positions and to hold a small amount of fluid in  your mouth before being asked to swallow while the imaging is performed .In some instances you may be asked to swallow barium coated marshmallows to assess the motility of a solid food bolus. The exam can be recorded as a digital or video fluoroscopy procedure. POST PROCEDURE It will take 1-2 days for the barium to pass through your system. To facilitate this, it is important, unless otherwise directed, to increase your fluids for the next 24-48hrs and to resume your normal diet.  This test typically takes about 30 minutes to perform. __________________________________________________________________________________   Will call with results.  Thank you for choosing me and [facility].  Tye Savoy, NP

## 2016-10-24 DIAGNOSIS — D631 Anemia in chronic kidney disease: Secondary | ICD-10-CM | POA: Diagnosis not present

## 2016-10-24 DIAGNOSIS — N186 End stage renal disease: Secondary | ICD-10-CM | POA: Diagnosis not present

## 2016-10-24 DIAGNOSIS — N2581 Secondary hyperparathyroidism of renal origin: Secondary | ICD-10-CM | POA: Diagnosis not present

## 2016-10-24 NOTE — Telephone Encounter (Signed)
Left voicemail for patient to call the office back.   

## 2016-10-24 NOTE — Telephone Encounter (Signed)
Patient returned my phone call. Went over information below and she verbalized understanding.

## 2016-10-27 DIAGNOSIS — N186 End stage renal disease: Secondary | ICD-10-CM | POA: Diagnosis not present

## 2016-10-27 DIAGNOSIS — D631 Anemia in chronic kidney disease: Secondary | ICD-10-CM | POA: Diagnosis not present

## 2016-10-27 DIAGNOSIS — N2581 Secondary hyperparathyroidism of renal origin: Secondary | ICD-10-CM | POA: Diagnosis not present

## 2016-10-27 NOTE — Progress Notes (Signed)
Reviewed and agree with initial management plan.  Helayna Dun T. Arrabella Westerman, MD FACG 

## 2016-10-28 ENCOUNTER — Ambulatory Visit (HOSPITAL_COMMUNITY)
Admission: RE | Admit: 2016-10-28 | Discharge: 2016-10-28 | Disposition: A | Discharge status: Home / Self Care | Payer: Medicare Other | Source: Ambulatory Visit | Attending: Nurse Practitioner | Admitting: Nurse Practitioner

## 2016-10-28 DIAGNOSIS — K228 Other specified diseases of esophagus: Secondary | ICD-10-CM | POA: Diagnosis not present

## 2016-10-28 DIAGNOSIS — R1310 Dysphagia, unspecified: Secondary | ICD-10-CM | POA: Diagnosis not present

## 2016-10-29 DIAGNOSIS — N186 End stage renal disease: Secondary | ICD-10-CM | POA: Diagnosis not present

## 2016-10-29 DIAGNOSIS — N2581 Secondary hyperparathyroidism of renal origin: Secondary | ICD-10-CM | POA: Diagnosis not present

## 2016-10-29 DIAGNOSIS — D631 Anemia in chronic kidney disease: Secondary | ICD-10-CM | POA: Diagnosis not present

## 2016-10-30 ENCOUNTER — Telehealth: Payer: Self-pay | Admitting: Nurse Practitioner

## 2016-10-30 DIAGNOSIS — Z992 Dependence on renal dialysis: Secondary | ICD-10-CM | POA: Diagnosis not present

## 2016-10-30 DIAGNOSIS — N186 End stage renal disease: Secondary | ICD-10-CM | POA: Diagnosis not present

## 2016-10-30 DIAGNOSIS — E1129 Type 2 diabetes mellitus with other diabetic kidney complication: Secondary | ICD-10-CM | POA: Diagnosis not present

## 2016-10-30 NOTE — Telephone Encounter (Signed)
Pt calling for barium swallow results. Please advise. 

## 2016-10-31 DIAGNOSIS — N186 End stage renal disease: Secondary | ICD-10-CM | POA: Diagnosis not present

## 2016-10-31 DIAGNOSIS — N2581 Secondary hyperparathyroidism of renal origin: Secondary | ICD-10-CM | POA: Diagnosis not present

## 2016-10-31 DIAGNOSIS — D631 Anemia in chronic kidney disease: Secondary | ICD-10-CM | POA: Diagnosis not present

## 2016-10-31 NOTE — Telephone Encounter (Signed)
Hey, I already routed results and recommendations to [place]. Thanks

## 2016-11-03 DIAGNOSIS — D631 Anemia in chronic kidney disease: Secondary | ICD-10-CM | POA: Diagnosis not present

## 2016-11-03 DIAGNOSIS — N186 End stage renal disease: Secondary | ICD-10-CM | POA: Diagnosis not present

## 2016-11-03 DIAGNOSIS — N2581 Secondary hyperparathyroidism of renal origin: Secondary | ICD-10-CM | POA: Diagnosis not present

## 2016-11-04 ENCOUNTER — Ambulatory Visit (AMBULATORY_SURGERY_CENTER): Payer: Self-pay

## 2016-11-04 VITALS — Ht 61.0 in | Wt 182.4 lb

## 2016-11-04 DIAGNOSIS — R1319 Other dysphagia: Secondary | ICD-10-CM

## 2016-11-04 DIAGNOSIS — R1310 Dysphagia, unspecified: Secondary | ICD-10-CM

## 2016-11-04 NOTE — Progress Notes (Signed)
Per pt, no allergies to soy or egg products.Pt not taking any weight loss meds or using  O2 at home.   Pt refused Emmi video. 

## 2016-11-05 DIAGNOSIS — N2581 Secondary hyperparathyroidism of renal origin: Secondary | ICD-10-CM | POA: Diagnosis not present

## 2016-11-05 DIAGNOSIS — N186 End stage renal disease: Secondary | ICD-10-CM | POA: Diagnosis not present

## 2016-11-05 DIAGNOSIS — D631 Anemia in chronic kidney disease: Secondary | ICD-10-CM | POA: Diagnosis not present

## 2016-11-07 DIAGNOSIS — D631 Anemia in chronic kidney disease: Secondary | ICD-10-CM | POA: Diagnosis not present

## 2016-11-07 DIAGNOSIS — N186 End stage renal disease: Secondary | ICD-10-CM | POA: Diagnosis not present

## 2016-11-07 DIAGNOSIS — N2581 Secondary hyperparathyroidism of renal origin: Secondary | ICD-10-CM | POA: Diagnosis not present

## 2016-11-10 DIAGNOSIS — D631 Anemia in chronic kidney disease: Secondary | ICD-10-CM | POA: Diagnosis not present

## 2016-11-10 DIAGNOSIS — N2581 Secondary hyperparathyroidism of renal origin: Secondary | ICD-10-CM | POA: Diagnosis not present

## 2016-11-10 DIAGNOSIS — N186 End stage renal disease: Secondary | ICD-10-CM | POA: Diagnosis not present

## 2016-11-11 ENCOUNTER — Other Ambulatory Visit: Payer: Self-pay | Admitting: Family Medicine

## 2016-11-11 DIAGNOSIS — I160 Hypertensive urgency: Secondary | ICD-10-CM

## 2016-11-11 DIAGNOSIS — I129 Hypertensive chronic kidney disease with stage 1 through stage 4 chronic kidney disease, or unspecified chronic kidney disease: Secondary | ICD-10-CM

## 2016-11-11 NOTE — Telephone Encounter (Signed)
° ° °  Pt said she does not know the name of her bp med but she need a refill and would like a call back concerning this

## 2016-11-11 NOTE — Telephone Encounter (Signed)
Called and spoke with patient and her daughter. They were confused about the medications because they have patient's old doctors name on them. They asked that we please send refills in with your name on them so everything is accurate. Please see pended orders and make sure they are correct.   Thank you!

## 2016-11-12 DIAGNOSIS — N2581 Secondary hyperparathyroidism of renal origin: Secondary | ICD-10-CM | POA: Diagnosis not present

## 2016-11-12 DIAGNOSIS — D631 Anemia in chronic kidney disease: Secondary | ICD-10-CM | POA: Diagnosis not present

## 2016-11-12 DIAGNOSIS — N186 End stage renal disease: Secondary | ICD-10-CM | POA: Diagnosis not present

## 2016-11-12 MED ORDER — LOSARTAN POTASSIUM 100 MG PO TABS: 100.0000 mg | ORAL_TABLET | Freq: Every day | ORAL | 1 refills | Status: DC

## 2016-11-12 MED ORDER — METOPROLOL SUCCINATE ER 50 MG PO TB24: 50.0000 mg | ORAL_TABLET | Freq: Every day | ORAL | 1 refills | Status: DC

## 2016-11-12 MED ORDER — HYDRALAZINE HCL 10 MG PO TABS
10.0000 mg | ORAL_TABLET | Freq: Two times a day (BID) | ORAL | 1 refills | Status: DC
Start: 2016-11-12 — End: 2017-08-04

## 2016-11-12 MED ORDER — CLONIDINE HCL 0.2 MG PO TABS: 0.2000 mg | ORAL_TABLET | Freq: Three times a day (TID) | ORAL | 1 refills | Status: DC

## 2016-11-12 MED ORDER — AMLODIPINE BESYLATE 10 MG PO TABS: 10.0000 mg | ORAL_TABLET | Freq: Every day | ORAL | 1 refills | Status: DC

## 2016-11-13 ENCOUNTER — Encounter: Payer: Self-pay | Admitting: Adult Health

## 2016-11-13 ENCOUNTER — Ambulatory Visit (INDEPENDENT_AMBULATORY_CARE_PROVIDER_SITE_OTHER): Payer: Medicare Other | Admitting: Adult Health

## 2016-11-13 VITALS — BP 173/62 | HR 68 | Ht 61.0 in | Wt 181.6 lb

## 2016-11-13 DIAGNOSIS — M545 Low back pain, unspecified: Secondary | ICD-10-CM

## 2016-11-13 DIAGNOSIS — M47816 Spondylosis without myelopathy or radiculopathy, lumbar region: Secondary | ICD-10-CM | POA: Diagnosis not present

## 2016-11-13 DIAGNOSIS — G8929 Other chronic pain: Secondary | ICD-10-CM | POA: Diagnosis not present

## 2016-11-13 MED ORDER — DULOXETINE HCL 30 MG PO CPEP: 60.0000 mg | ORAL_CAPSULE | Freq: Every day | ORAL | 3 refills | Status: DC

## 2016-11-13 NOTE — Patient Instructions (Addendum)
Epidural steroid injection of back Continue Cymbalta 60 mg at bedtime- please discuss with nephrologist at your appointment on Monday. If your symptoms worsen or you develop new symptoms please let us know.

## 2016-11-13 NOTE — Progress Notes (Signed)
I have read the note, and I agree with the clinical assessment and plan.  [name]   

## 2016-11-13 NOTE — Progress Notes (Signed)
PATIENT: [name] DOB: [birth year]  REASON FOR VISIT: follow up- chronic back pain, peripheral neuropathy HISTORY FROM: patient  HISTORY OF PRESENT ILLNESS: Today 11/13/2016: [name] is a 76 year old female with a history of peripheral neuropathy and chronic low back pain. She returns today for follow-up. She states she recently  had more burning and tingling in the legs primarily at bedtime. She also states that her legs jump all night long. She states last night she woke up at 1:00 and has been up ever since. She is on Lyrica and Cymbalta. She states that she increased her Cymbalta 2 tablets at bedtime. She reports that this has helped her discomfort somewhat. Reports that in the past after she received the epidural steroid injection in the lumbar region this helped her back pain as well as the discomfort in her legs. The patient does have chronic renal failure and receives dialysis Monday Wednesday and Friday. She returns today for an evaluation.  HISTORY [name] is a 76 year old right-handed black female with a history of a peripheral neuropathy and chronic low back pain. The patient was recently in the hospital for chest pain on 04/19/2016. The patient was found to have gastroesophageal reflux disease, she is currently on medication for this. The patient received an epidural steroid injection in August 2017 and she gained excellent benefit with the procedure. The patient is not having any back pain or leg discomfort at this time. The meralgia paresthetica on the right has improved. The patient is sleeping well at night, she has had one fall out of bed when she rolled over, but no falls with walking. The patient is able to walk up to 3 blocks at a time without difficulty. The patient is pleased with her current progress.  REVIEW OF SYSTEMS: Out of a complete 14 system review of symptoms, the patient complains only of the following symptoms, and all other reviewed systems are  negative.  Cold intolerance, light sensitivity, cough, choking, runny nose, trouble swallowing, restless leg, insomnia, frequent waking, back pain, muscle cramps, walking difficulty, urine decrease, painful urination, difficulty urinating, headache, weakness  ALLERGIES: Allergies  Allergen Reactions  . Tuberculin Tests Hives    "blisters"  . Valacyclovir Other (See Comments)    Confusion and nervousness  . Codeine Nausea And Vomiting  . Penicillins Rash    No problems breathing. Has tolerated omnicef in past without issue Has patient had a PCN reaction causing immediate rash, facial/tongue/throat swelling, SOB or lightheadedness with hypotension: Yes Has patient had a PCN reaction causing severe rash involving mucus membranes or skin necrosis: No Has patient had a PCN reaction that required hospitalization No Has patient had a PCN reaction occurring within the last 10 years: No If all of the above answers are "NO", then may proceed with Cephalosporin  . Sulfamethoxazole Rash    HOME MEDICATIONS: Outpatient Medications Prior to Visit  Medication Sig Dispense Refill  . acetaminophen (TYLENOL) 500 MG tablet Take 1,000 mg by mouth every 6 (six) hours as needed for mild pain or moderate pain.    Marland Kitchen albuterol (PROVENTIL HFA;VENTOLIN HFA) 108 (90 Base) MCG/ACT inhaler Inhale 2 puffs into the lungs every 4 (four) hours as needed for wheezing or shortness of breath. 1 Inhaler 1  . allopurinol (ZYLOPRIM) 100 MG tablet Take 100 mg by mouth once daily on non-dialysis days (Tues/Thurs/Sat/Sun) 30 tablet 0  . Aluminum & Magnesium Hydroxide 600-300 MG/5ML CONC Take 5 mLs by mouth every 6 (six) hours as needed  for indigestion. 150 mL 0  . amLODipine (NORVASC) 10 MG tablet Take 1 tablet (10 mg total) by mouth at bedtime. 90 tablet 1  . aspirin EC 81 MG tablet Take 81 mg by mouth every morning.    Marland Kitchen atorvastatin (LIPITOR) 40 MG tablet Take 1 tablet (40 mg total) by mouth daily. 90 tablet 3  . azelastine  (ASTELIN) 0.1 % nasal spray Place 1 spray into both nostrils 2 (two) times daily. Use in each nostril as directed 30 mL 6  . cloNIDine (CATAPRES) 0.2 MG tablet Take 1 tablet (0.2 mg total) by mouth 3 (three) times daily. 270 tablet 1  . DULoxetine (CYMBALTA) 30 MG capsule Take 1 capsule (30 mg total) by mouth daily. 90 capsule 3  . fluticasone (FLONASE) 50 MCG/ACT nasal spray Place 2 sprays into both nostrils daily as needed for allergies or rhinitis. 16 g 3  . Homeopathic Products (LEG CRAMP RELIEF PO) Take 1-2 tablets by mouth as needed (for leg pain).    . hydrALAZINE (APRESOLINE) 10 MG tablet Take 1 tablet (10 mg total) by mouth 2 (two) times daily. 180 tablet 1  . ipratropium (ATROVENT HFA) 17 MCG/ACT inhaler Inhale 2 puffs into the lungs every 4 (four) hours as needed for wheezing. 1 Inhaler 12  . lidocaine-prilocaine (EMLA) cream Apply 1 application topically 3 (three) times a week. Use before dialysis on MWF    . loratadine (CLARITIN) 10 MG tablet Take 1 tablet (10 mg total) by mouth daily as needed for allergies. 90 tablet 3  . losartan (COZAAR) 100 MG tablet Take 1 tablet (100 mg total) by mouth daily. 90 tablet 1  . metoprolol succinate (TOPROL-XL) 50 MG 24 hr tablet Take 1 tablet (50 mg total) by mouth at bedtime. 90 tablet 1  . multivitamin (RENA-VIT) TABS tablet Take 1 tablet by mouth at bedtime. (Patient taking differently: Take 1 tablet by mouth every morning. ) 60 tablet 5  . omeprazole (PRILOSEC) 40 MG capsule Take 1 capsule (40 mg total) by mouth 2 (two) times daily. 60 capsule 11  . pregabalin (LYRICA) 100 MG capsule Take 100 mg by mouth every evening.     Marland Kitchen RENVELA 800 MG tablet Take 3 pills after every meal and snacks    . SENSIPAR 90 MG tablet daily.      No facility-administered medications prior to visit.     PAST MEDICAL HISTORY: Past Medical History:  Diagnosis Date  . Abnormality of gait 03/28/2015  . Adrenal mass ([place])   . ANEMIA NEC 03/31/2007   Qualifier:  Diagnosis of  By: [name] MD, [name]    . Arthritis   . Back pain   . CHF (congestive heart failure) ([place])   . Chronic kidney disease    Hemo MWF  . Congestion of throat    Pt states she has a lot mucus in back throat.  . Constipation   . COPD (chronic obstructive pulmonary disease) ([place])   . Depression   . Dialysis patient [facility])    kidney  . Diverticulitis   . GERD (gastroesophageal reflux disease)   . H/O hiatal hernia   . High cholesterol   . Hoarseness of voice   . Hyperlipidemia   . Hypertension   . IBS (irritable bowel syndrome)   . Meralgia paresthetica of right side 12/26/2014  . Normal cardiac stress test 12/24/2009   lexiscan, imaging normal  . Renal disorder   . RLS (restless legs syndrome)   . Seizures ([place])  2004 past brain surgery  . Sinus complaint   . Thyroid disease   . TIA (transient ischemic attack)   . Tubular adenoma of colon 01/2008    PAST SURGICAL HISTORY: Past Surgical History:  Procedure Laterality Date  . ABDOMINAL HYSTERECTOMY    . ANAL RECTAL MANOMETRY N/A 07/25/2015   Procedure: ANO RECTAL MANOMETRY;  Surgeon: Mauri Pole, MD;  Location: [facility];  Service: Endoscopy;  Laterality: N/A;  . APPENDECTOMY    . AV FISTULA PLACEMENT Left 11/11/2012   Procedure: INSERTION OF ARTERIOVENOUS (AV) GORE-TEX GRAFT ARM;  Surgeon: Angelia Mould, MD;  Location: [place];  Service: Vascular;  Laterality: Left;  . [place] REMOVAL Left 11/18/2012   Procedure: REMOVAL OF LEFT UPPER ARM ARTERIOVENOUS GORETEX GRAFT ([place]);  Surgeon: Angelia Mould, MD;  Location: [place];  Service: Vascular;  Laterality: Left;  . CARDIAC CATHETERIZATION  2003   normal  . CHOLECYSTECTOMY     Open mid line incision  . frontal craniotomy  2002   indication = sinusitis  . INSERTION OF DIALYSIS CATHETER     Left  . PATCH ANGIOPLASTY Left 11/18/2012   Procedure: PATCH ANGIOPLASTY;  Surgeon: Angelia Mould, MD;  Location: [place];  Service: Vascular;   Laterality: Left;  . PERIPHERAL VASCULAR CATHETERIZATION Left 03/08/2015   Procedure: A/V Shuntogram/Fistulagram;  Surgeon: Algernon Huxley, MD;  Location: [place] [facility];  Service: Cardiovascular;  Laterality: Left;  . PERIPHERAL VASCULAR CATHETERIZATION Left 03/08/2015   Procedure: A/V Shunt Intervention;  Surgeon: Algernon Huxley, MD;  Location: [place] [facility];  Service: Cardiovascular;  Laterality: Left;  . RECTAL ULTRASOUND N/A 10/05/2015   Procedure: ANAL ULTRASOUND WITH PROBE;  Surgeon: Leighton Ruff, MD;  Location: [facility];  Service: Endoscopy;  Laterality: N/A;  . REVISON OF ARTERIOVENOUS FISTULA  05/11/2012   Procedure: REVISON OF ARTERIOVENOUS FISTULA;  Surgeon: Elam Dutch, MD;  Location: [name];  Service: Vascular;  Laterality: Right;  . TUBAL LIGATION      FAMILY HISTORY: Family History  Problem Relation Age of Onset  . Thyroid cancer Mother   . Heart disease Father   . Hypertension Father   . Heart attack Father   . Lupus Daughter   . Breast cancer Sister   . Thyroid cancer Sister   . Kidney disease Sister   . Colon cancer Neg Hx     SOCIAL HISTORY: Social History   Social History  . Marital status: Single    Spouse name: N/A  . Number of children: 3  . Years of education: 15   Occupational History  . retired    Social History Main Topics  . Smoking status: Current Every Day Smoker    Packs/day: 1.00    Years: 60.00    Types: Cigarettes  . Smokeless tobacco: Never Used     Comment: smoker since 76 yo.  1.5 ppd of [place] 100 lights. ; form given 10-23-16  . Alcohol use No  . Drug use: No     Comment: No hx of IV drug use  . Sexual activity: No   Other Topics Concern  . Not on file   Social History Narrative   ** Merged History Encounter **       Lives in [place] with Daughter, granddaughters (twins) and mentally challenged niece.   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   Patient has a college education.   Patient drinks  one cup of caffeine daily.  PHYSICAL EXAM  Vitals:   11/13/16 0824  BP: (!) 173/62  Pulse: 68  Weight: 181 lb 9.6 oz (82.4 kg)  Height: 5\' 1"  (1.549 m)   Body mass index is 34.31 kg/m.  Generalized: Well developed, in no acute distress   Neurological examination  Mentation: Alert oriented to time, place, history taking. Follows all commands speech and language fluent Cranial nerve II-XII: Pupils were equal round reactive to light. Extraocular movements were full, visual field were full on confrontational test. Facial sensation and strength were normal. Uvula tongue midline. Head turning and shoulder shrug  were normal and symmetric. Motor: The motor testing reveals 5 over 5 strength of all 4 extremities. Good symmetric motor tone is noted throughout.  Sensory: Sensory testing is intact to soft touch on all 4 extremities. No evidence of extinction is noted.  Coordination: Cerebellar testing reveals good finger-nose-finger and heel-to-shin bilaterally.  Gait and station: Gait is slightly unsteady due to back pain. Reflexes: Deep tendon reflexes are symmetric but decreased throughout   DIAGNOSTIC DATA (LABS, IMAGING, TESTING) - I reviewed patient records, labs, notes, testing and imaging myself where available.  Lab Results  Component Value Date   WBC 6.9 04/20/2016   HGB 10.2 (L) 04/20/2016   HCT 32.5 (L) 04/20/2016   MCV 90.0 04/20/2016   PLT 192 04/20/2016      Component Value Date/Time   NA 131 (L) 04/20/2016 0432   NA 136 07/07/2013 1334   K 3.7 04/20/2016 0432   K 4.0 07/07/2013 1334   CL 93 (L) 04/20/2016 0432   CL 100 07/07/2013 1334   CO2 24 04/20/2016 0432   CO2 29 07/07/2013 1334   GLUCOSE 95 04/20/2016 0432   GLUCOSE 75 07/07/2013 1334   BUN 28 (H) 04/20/2016 0432   BUN 30 (H) 07/07/2013 1334   CREATININE 8.08 (H) 04/20/2016 0432   CREATININE 7.07 (H) 07/07/2013 1334   CREATININE 4.92 (H) 03/11/2012 1218   CALCIUM 8.1 (L) 04/20/2016  0432   CALCIUM 10.0 07/07/2013 1334   CALCIUM 8.4 05/06/2010 0515   PROT 6.9 04/19/2016 1150   PROT 7.2 08/07/2015 0827   ALBUMIN 2.8 (L) 04/19/2016 1655   AST 19 04/19/2016 1150   ALT 12 (L) 04/19/2016 1150   ALKPHOS 86 04/19/2016 1150   BILITOT 0.4 04/19/2016 1150   GFRNONAA 4 (L) 04/20/2016 0432   GFRNONAA 5 (L) 07/07/2013 1334   GFRAA 5 (L) 04/20/2016 0432   GFRAA 6 (L) 07/07/2013 1334   Lab Results  Component Value Date   CHOL 154 05/01/2010   HDL 36 (L) 05/01/2010   LDLCALC 96 05/01/2010   LDLDIRECT 76 08/13/2011   TRIG 109 05/01/2010   CHOLHDL 4.3 Ratio 05/01/2010    Lab Results  Component Value Date   VITAMINB12 697 08/07/2015   Lab Results  Component Value Date   TSH 0.742 12/31/2014      ASSESSMENT AND PLAN 76 y.o. year old female  has a past medical history of Abnormality of gait (03/28/2015); Adrenal mass ([place]); ANEMIA NEC (03/31/2007); Arthritis; Back pain; CHF (congestive heart failure) ([place]); Chronic kidney disease; Congestion of throat; Constipation; COPD (chronic obstructive pulmonary disease) ([place]); Depression; Dialysis patient [facility]); Diverticulitis; GERD (gastroesophageal reflux disease); H/O hiatal hernia; High cholesterol; Hoarseness of voice; Hyperlipidemia; Hypertension; IBS (irritable bowel syndrome); Meralgia paresthetica of right side (12/26/2014); Normal cardiac stress test (12/24/2009); Renal disorder; RLS (restless legs syndrome); Seizures ([place]); Sinus complaint; Thyroid disease; TIA (transient ischemic attack); and Tubular adenoma of colon (01/2008).  here with:  1. Peripheral neuropathy 2. Chronic back pain  In the past the patient has responded well from a lumbar epidural steroid injection. I will put an order in to have this repeated. Patient is advised that if this does not help resolve her symptoms she should let us know. She will continue on Cymbalta 60 mg daily. I did advise that she should discuss this with her nephrologist at her  appointment on Monday. She verbalized understanding. She will follow-up in 6 months or sooner if needed.    Ward Givens, MSN, NP-C 11/13/2016, 8:37 AM [facility] [address] [place],  [address] [phone]

## 2016-11-14 DIAGNOSIS — D631 Anemia in chronic kidney disease: Secondary | ICD-10-CM | POA: Diagnosis not present

## 2016-11-14 DIAGNOSIS — N186 End stage renal disease: Secondary | ICD-10-CM | POA: Diagnosis not present

## 2016-11-14 DIAGNOSIS — N2581 Secondary hyperparathyroidism of renal origin: Secondary | ICD-10-CM | POA: Diagnosis not present

## 2016-11-17 DIAGNOSIS — N2581 Secondary hyperparathyroidism of renal origin: Secondary | ICD-10-CM | POA: Diagnosis not present

## 2016-11-17 DIAGNOSIS — D631 Anemia in chronic kidney disease: Secondary | ICD-10-CM | POA: Diagnosis not present

## 2016-11-17 DIAGNOSIS — N186 End stage renal disease: Secondary | ICD-10-CM | POA: Diagnosis not present

## 2016-11-19 DIAGNOSIS — N2581 Secondary hyperparathyroidism of renal origin: Secondary | ICD-10-CM | POA: Diagnosis not present

## 2016-11-19 DIAGNOSIS — D631 Anemia in chronic kidney disease: Secondary | ICD-10-CM | POA: Diagnosis not present

## 2016-11-19 DIAGNOSIS — N186 End stage renal disease: Secondary | ICD-10-CM | POA: Diagnosis not present

## 2016-11-21 DIAGNOSIS — N2581 Secondary hyperparathyroidism of renal origin: Secondary | ICD-10-CM | POA: Diagnosis not present

## 2016-11-21 DIAGNOSIS — D631 Anemia in chronic kidney disease: Secondary | ICD-10-CM | POA: Diagnosis not present

## 2016-11-21 DIAGNOSIS — N186 End stage renal disease: Secondary | ICD-10-CM | POA: Diagnosis not present

## 2016-11-24 DIAGNOSIS — N186 End stage renal disease: Secondary | ICD-10-CM | POA: Diagnosis not present

## 2016-11-24 DIAGNOSIS — N2581 Secondary hyperparathyroidism of renal origin: Secondary | ICD-10-CM | POA: Diagnosis not present

## 2016-11-24 DIAGNOSIS — D631 Anemia in chronic kidney disease: Secondary | ICD-10-CM | POA: Diagnosis not present

## 2016-11-25 ENCOUNTER — Encounter: Payer: Self-pay | Admitting: Gastroenterology

## 2016-11-25 ENCOUNTER — Ambulatory Visit (AMBULATORY_SURGERY_CENTER): Payer: Medicare Other | Admitting: Gastroenterology

## 2016-11-25 VITALS — BP 100/49 | HR 61 | Temp 97.3°F | Resp 19 | Ht 61.0 in | Wt 182.0 lb

## 2016-11-25 DIAGNOSIS — K219 Gastro-esophageal reflux disease without esophagitis: Secondary | ICD-10-CM | POA: Diagnosis not present

## 2016-11-25 DIAGNOSIS — Z8673 Personal history of transient ischemic attack (TIA), and cerebral infarction without residual deficits: Secondary | ICD-10-CM | POA: Diagnosis not present

## 2016-11-25 DIAGNOSIS — R1310 Dysphagia, unspecified: Secondary | ICD-10-CM

## 2016-11-25 DIAGNOSIS — R933 Abnormal findings on diagnostic imaging of other parts of digestive tract: Secondary | ICD-10-CM

## 2016-11-25 DIAGNOSIS — K2970 Gastritis, unspecified, without bleeding: Secondary | ICD-10-CM

## 2016-11-25 DIAGNOSIS — J449 Chronic obstructive pulmonary disease, unspecified: Secondary | ICD-10-CM | POA: Diagnosis not present

## 2016-11-25 DIAGNOSIS — K3189 Other diseases of stomach and duodenum: Secondary | ICD-10-CM | POA: Diagnosis not present

## 2016-11-25 DIAGNOSIS — I509 Heart failure, unspecified: Secondary | ICD-10-CM | POA: Diagnosis not present

## 2016-11-25 DIAGNOSIS — I10 Essential (primary) hypertension: Secondary | ICD-10-CM | POA: Diagnosis not present

## 2016-11-25 DIAGNOSIS — K449 Diaphragmatic hernia without obstruction or gangrene: Secondary | ICD-10-CM | POA: Diagnosis not present

## 2016-11-25 MED ORDER — SODIUM CHLORIDE 0.9 % IV SOLN: 500.0000 mL | INTRAVENOUS | Status: DC

## 2016-11-25 NOTE — Progress Notes (Signed)
Report to PACU, RN, vss, BBS= Clear.  

## 2016-11-25 NOTE — Op Note (Signed)
[place] Patient Name: [name] Procedure Date: 11/25/2016 9:59 AM MRN: [id number] Endoscopist: Ladene Artist , MD Age: 76 Referring MD:  Date of Birth: 1940/11/28 Gender: Female Account #: [id number] Procedure:                Upper GI endoscopy Indications:              Dysphagia, Gastro-esophageal reflux disease,                            Abnormal barium esophagram Medicines:                Monitored Anesthesia Care Procedure:                Pre-Anesthesia Assessment:                           - Prior to the procedure, a History and Physical                            was performed, and patient medications and                            allergies were reviewed. The patient's tolerance of                            previous anesthesia was also reviewed. The risks                            and benefits of the procedure and the sedation                            options and risks were discussed with the patient.                            All questions were answered, and informed consent                            was obtained. Prior Anticoagulants: The patient has                            taken no previous anticoagulant or antiplatelet                            agents. ASA Grade Assessment: III - A patient with                            severe systemic disease. After reviewing the risks                            and benefits, the patient was deemed in                            satisfactory condition to undergo the procedure.  After obtaining informed consent, the endoscope was                            passed under direct vision. Throughout the                            procedure, the patient's blood pressure, pulse, and                            oxygen saturations were monitored continuously. The                            Endoscope was introduced through the mouth, and                            advanced to the second part of  duodenum. The upper                            GI endoscopy was accomplished without difficulty.                            The patient tolerated the procedure well. Scope In: Scope Out: Findings:                 No endoscopic abnormality was evident in the                            esophagus to explain the patient's complaint of                            dysphagia. It was decided, however, to proceed with                            dilation of the entire esophagus. A guidewire was                            placed and the scope was withdrawn. Dilation was                            performed with a Savary dilator with no resistance                            at 16 mm.                           Localized mild inflammation characterized by                            erythema and friability was found in the prepyloric                            region of the stomach. Biopsies were taken with a  cold forceps for histology.                           The exam of the stomach was otherwise normal.                           The duodenal bulb and second portion of the                            duodenum were normal. Complications:            No immediate complications. Estimated Blood Loss:     Estimated blood loss was minimal. Impression:               - No endoscopic esophageal abnormality to explain                            patient's dysphagia. Esophagus dilated. Dilated.                           - Gastritis. Biopsied.                           - Normal duodenal bulb and second portion of the                            duodenum. Recommendation:           - Clear liquid diet for 2 hour, then advance as                            tolerated to soft diet today.                           - Continue present medications.                           - Await pathology results.                           - ENT referral for hoarseness, dry mouth Ladene Artist,  MD 11/25/2016 10:33:58 AM This report has been signed electronically.

## 2016-11-25 NOTE — Patient Instructions (Signed)
Impression/Recommendations:  Gastritis handout given to patient. Dilation diet handout given to patient.  Clear liquid diet for 2 hours, then advance as tolerated to soft diet today.  Continue present medications. Await pathology results.  Referral to ENT for hoarseness and dry mouth.  YOU HAD AN ENDOSCOPIC PROCEDURE TODAY AT [facility]:   Refer to the procedure report that was given to you for any specific questions about what was found during the examination.  If the procedure report does not answer your questions, please call your gastroenterologist to clarify.  If you requested that your care partner not be given the details of your procedure findings, then the procedure report has been included in a sealed envelope for you to review at your convenience later.  YOU SHOULD EXPECT: Some feelings of bloating in the abdomen. Passage of more gas than usual.  Walking can help get rid of the air that was put into your GI tract during the procedure and reduce the bloating. If you had a lower endoscopy (such as a colonoscopy or flexible sigmoidoscopy) you may notice spotting of blood in your stool or on the toilet paper. If you underwent a bowel prep for your procedure, you may not have a normal bowel movement for a few days.  Please Note:  You might notice some irritation and congestion in your nose or some drainage.  This is from the oxygen used during your procedure.  There is no need for concern and it should clear up in a day or so.  SYMPTOMS TO REPORT IMMEDIATELY:   Following upper endoscopy (EGD)  Vomiting of blood or coffee ground material  New chest pain or pain under the shoulder blades  Painful or persistently difficult swallowing  New shortness of breath  Fever of 100F or higher  Black, tarry-looking stools  For urgent or emergent issues, a gastroenterologist can be reached at any hour by calling [phone].   DIET:  We do recommend a small meal at first,  but then you may proceed to your regular diet.  Drink plenty of fluids but you should avoid alcoholic beverages for 24 hours.  ACTIVITY:  You should plan to take it easy for the rest of today and you should NOT DRIVE or use heavy machinery until tomorrow (because of the sedation medicines used during the test).    FOLLOW UP: Our staff will call the number listed on your records the next business day following your procedure to check on you and address any questions or concerns that you may have regarding the information given to you following your procedure. If we do not reach you, we will leave a message.  However, if you are feeling well and you are not experiencing any problems, there is no need to return our call.  We will assume that you have returned to your regular daily activities without incident.  If any biopsies were taken you will be contacted by phone or by letter within the next 1-3 weeks.  Please call us at [phone] if you have not heard about the biopsies in 3 weeks.    SIGNATURES/CONFIDENTIALITY: You and/or your care partner have signed paperwork which will be entered into your electronic medical record.  These signatures attest to the fact that that the information above on your After Visit Summary has been reviewed and is understood.  Full responsibility of the confidentiality of this discharge information lies with you and/or your care-partner.

## 2016-11-25 NOTE — Progress Notes (Signed)
Called to room to assist during endoscopic procedure.  Patient ID and intended procedure confirmed with present staff. Received instructions for my participation in the procedure from the performing physician.  

## 2016-11-25 NOTE — Progress Notes (Signed)
Pt's states no medical or surgical changes since previsit or office visit. 

## 2016-11-26 ENCOUNTER — Telehealth: Payer: Self-pay

## 2016-11-26 DIAGNOSIS — N186 End stage renal disease: Secondary | ICD-10-CM | POA: Diagnosis not present

## 2016-11-26 DIAGNOSIS — N2581 Secondary hyperparathyroidism of renal origin: Secondary | ICD-10-CM | POA: Diagnosis not present

## 2016-11-26 DIAGNOSIS — D631 Anemia in chronic kidney disease: Secondary | ICD-10-CM | POA: Diagnosis not present

## 2016-11-26 NOTE — Telephone Encounter (Signed)
Patient is scheduled for ENT referral per procedure report on 11/25/16.  She is scheduled to see Dr. Redmond Baseman at [phone] the appt is scheduled for 12/29/16 2:10. I left a message for the patient per her daughter's instructions. They are asked to call back for any questions or concerns.

## 2016-11-26 NOTE — Telephone Encounter (Signed)
  Follow up Call-  Call back number 11/25/2016 08/28/2015  Post procedure Call Back phone  # [phone]  Permission to leave phone message Yes Yes  Some recent data might be hidden     Patient questions:  Do you have a fever, pain , or abdominal swelling? No. Pain Score  0 *  Have you tolerated food without any problems? Yes.    Have you been able to return to your normal activities? Yes.    Do you have any questions about your discharge instructions: Diet   No. Medications  No. Follow up visit  No.  Do you have questions or concerns about your Care? No.  Actions: * If pain score is 4 or above: No action needed, pain <4.

## 2016-11-27 ENCOUNTER — Ambulatory Visit
Admission: RE | Admit: 2016-11-27 | Discharge: 2016-11-27 | Disposition: A | Discharge status: Home / Self Care | Payer: Medicare Other | Source: Ambulatory Visit | Attending: Adult Health | Admitting: Adult Health

## 2016-11-27 DIAGNOSIS — M545 Low back pain: Secondary | ICD-10-CM | POA: Diagnosis not present

## 2016-11-27 DIAGNOSIS — M47816 Spondylosis without myelopathy or radiculopathy, lumbar region: Secondary | ICD-10-CM

## 2016-11-27 DIAGNOSIS — G8929 Other chronic pain: Secondary | ICD-10-CM

## 2016-11-27 MED ORDER — IOPAMIDOL (ISOVUE-M 200) INJECTION 41%
1.0000 mL | Freq: Once | INTRAMUSCULAR | Status: AC
  Administered 2016-11-27: 1 mL via EPIDURAL

## 2016-11-27 MED ORDER — METHYLPREDNISOLONE ACETATE 40 MG/ML INJ SUSP (RADIOLOG
120.0000 mg | Freq: Once | INTRAMUSCULAR | Status: AC
  Administered 2016-11-27: 120 mg via EPIDURAL

## 2016-11-27 NOTE — Discharge Instructions (Signed)

## 2016-11-28 DIAGNOSIS — N2581 Secondary hyperparathyroidism of renal origin: Secondary | ICD-10-CM | POA: Diagnosis not present

## 2016-11-28 DIAGNOSIS — D631 Anemia in chronic kidney disease: Secondary | ICD-10-CM | POA: Diagnosis not present

## 2016-11-28 DIAGNOSIS — N186 End stage renal disease: Secondary | ICD-10-CM | POA: Diagnosis not present

## 2016-11-29 DIAGNOSIS — E1129 Type 2 diabetes mellitus with other diabetic kidney complication: Secondary | ICD-10-CM | POA: Diagnosis not present

## 2016-11-29 DIAGNOSIS — Z992 Dependence on renal dialysis: Secondary | ICD-10-CM | POA: Diagnosis not present

## 2016-11-29 DIAGNOSIS — N186 End stage renal disease: Secondary | ICD-10-CM | POA: Diagnosis not present

## 2016-12-01 DIAGNOSIS — D631 Anemia in chronic kidney disease: Secondary | ICD-10-CM | POA: Diagnosis not present

## 2016-12-01 DIAGNOSIS — N186 End stage renal disease: Secondary | ICD-10-CM | POA: Diagnosis not present

## 2016-12-01 DIAGNOSIS — N2581 Secondary hyperparathyroidism of renal origin: Secondary | ICD-10-CM | POA: Diagnosis not present

## 2016-12-03 DIAGNOSIS — N186 End stage renal disease: Secondary | ICD-10-CM | POA: Diagnosis not present

## 2016-12-03 DIAGNOSIS — D631 Anemia in chronic kidney disease: Secondary | ICD-10-CM | POA: Diagnosis not present

## 2016-12-03 DIAGNOSIS — N2581 Secondary hyperparathyroidism of renal origin: Secondary | ICD-10-CM | POA: Diagnosis not present

## 2016-12-06 ENCOUNTER — Encounter (HOSPITAL_COMMUNITY): Payer: Self-pay | Admitting: Emergency Medicine

## 2016-12-06 ENCOUNTER — Emergency Department (HOSPITAL_COMMUNITY): Payer: Medicare Other

## 2016-12-06 ENCOUNTER — Inpatient Hospital Stay (HOSPITAL_COMMUNITY)
Admission: EM | Admit: 2016-12-06 | Discharge: 2016-12-08 | DRG: 193 | Disposition: A | Discharge status: Home / Self Care | Payer: Medicare Other | Attending: Family Medicine | Admitting: Family Medicine

## 2016-12-06 DIAGNOSIS — Z8249 Family history of ischemic heart disease and other diseases of the circulatory system: Secondary | ICD-10-CM

## 2016-12-06 DIAGNOSIS — E876 Hypokalemia: Secondary | ICD-10-CM | POA: Diagnosis not present

## 2016-12-06 DIAGNOSIS — J189 Pneumonia, unspecified organism: Principal | ICD-10-CM | POA: Diagnosis present

## 2016-12-06 DIAGNOSIS — F17210 Nicotine dependence, cigarettes, uncomplicated: Secondary | ICD-10-CM | POA: Diagnosis present

## 2016-12-06 DIAGNOSIS — J449 Chronic obstructive pulmonary disease, unspecified: Secondary | ICD-10-CM | POA: Diagnosis present

## 2016-12-06 DIAGNOSIS — D649 Anemia, unspecified: Secondary | ICD-10-CM | POA: Diagnosis present

## 2016-12-06 DIAGNOSIS — Z992 Dependence on renal dialysis: Secondary | ICD-10-CM

## 2016-12-06 DIAGNOSIS — E7800 Pure hypercholesterolemia, unspecified: Secondary | ICD-10-CM | POA: Diagnosis not present

## 2016-12-06 DIAGNOSIS — K5792 Diverticulitis of intestine, part unspecified, without perforation or abscess without bleeding: Secondary | ICD-10-CM | POA: Diagnosis present

## 2016-12-06 DIAGNOSIS — D631 Anemia in chronic kidney disease: Secondary | ICD-10-CM | POA: Diagnosis not present

## 2016-12-06 DIAGNOSIS — R748 Abnormal levels of other serum enzymes: Secondary | ICD-10-CM | POA: Diagnosis not present

## 2016-12-06 DIAGNOSIS — I160 Hypertensive urgency: Secondary | ICD-10-CM

## 2016-12-06 DIAGNOSIS — F17200 Nicotine dependence, unspecified, uncomplicated: Secondary | ICD-10-CM | POA: Diagnosis not present

## 2016-12-06 DIAGNOSIS — R778 Other specified abnormalities of plasma proteins: Secondary | ICD-10-CM | POA: Diagnosis present

## 2016-12-06 DIAGNOSIS — I5042 Chronic combined systolic (congestive) and diastolic (congestive) heart failure: Secondary | ICD-10-CM | POA: Diagnosis present

## 2016-12-06 DIAGNOSIS — Z880 Allergy status to penicillin: Secondary | ICD-10-CM

## 2016-12-06 DIAGNOSIS — I132 Hypertensive heart and chronic kidney disease with heart failure and with stage 5 chronic kidney disease, or end stage renal disease: Secondary | ICD-10-CM | POA: Diagnosis present

## 2016-12-06 DIAGNOSIS — Z882 Allergy status to sulfonamides status: Secondary | ICD-10-CM

## 2016-12-06 DIAGNOSIS — N186 End stage renal disease: Secondary | ICD-10-CM | POA: Diagnosis present

## 2016-12-06 DIAGNOSIS — I248 Other forms of acute ischemic heart disease: Secondary | ICD-10-CM | POA: Diagnosis not present

## 2016-12-06 DIAGNOSIS — I5022 Chronic systolic (congestive) heart failure: Secondary | ICD-10-CM | POA: Diagnosis present

## 2016-12-06 DIAGNOSIS — R7989 Other specified abnormal findings of blood chemistry: Secondary | ICD-10-CM

## 2016-12-06 DIAGNOSIS — R0781 Pleurodynia: Secondary | ICD-10-CM | POA: Diagnosis not present

## 2016-12-06 DIAGNOSIS — Z888 Allergy status to other drugs, medicaments and biological substances status: Secondary | ICD-10-CM

## 2016-12-06 DIAGNOSIS — G2581 Restless legs syndrome: Secondary | ICD-10-CM | POA: Diagnosis present

## 2016-12-06 DIAGNOSIS — R1310 Dysphagia, unspecified: Secondary | ICD-10-CM

## 2016-12-06 DIAGNOSIS — I10 Essential (primary) hypertension: Secondary | ICD-10-CM

## 2016-12-06 DIAGNOSIS — Z789 Other specified health status: Secondary | ICD-10-CM

## 2016-12-06 DIAGNOSIS — N2581 Secondary hyperparathyroidism of renal origin: Secondary | ICD-10-CM | POA: Diagnosis present

## 2016-12-06 DIAGNOSIS — Z885 Allergy status to narcotic agent status: Secondary | ICD-10-CM

## 2016-12-06 DIAGNOSIS — K219 Gastro-esophageal reflux disease without esophagitis: Secondary | ICD-10-CM | POA: Diagnosis present

## 2016-12-06 DIAGNOSIS — E785 Hyperlipidemia, unspecified: Secondary | ICD-10-CM | POA: Diagnosis present

## 2016-12-06 DIAGNOSIS — Y95 Nosocomial condition: Secondary | ICD-10-CM | POA: Diagnosis present

## 2016-12-06 DIAGNOSIS — M8580 Other specified disorders of bone density and structure, unspecified site: Secondary | ICD-10-CM | POA: Diagnosis present

## 2016-12-06 DIAGNOSIS — Z90710 Acquired absence of both cervix and uterus: Secondary | ICD-10-CM

## 2016-12-06 DIAGNOSIS — M898X9 Other specified disorders of bone, unspecified site: Secondary | ICD-10-CM | POA: Diagnosis present

## 2016-12-06 DIAGNOSIS — Z841 Family history of disorders of kidney and ureter: Secondary | ICD-10-CM

## 2016-12-06 DIAGNOSIS — I5032 Chronic diastolic (congestive) heart failure: Secondary | ICD-10-CM | POA: Diagnosis not present

## 2016-12-06 DIAGNOSIS — J440 Chronic obstructive pulmonary disease with acute lower respiratory infection: Secondary | ICD-10-CM | POA: Diagnosis present

## 2016-12-06 DIAGNOSIS — R0602 Shortness of breath: Secondary | ICD-10-CM | POA: Diagnosis not present

## 2016-12-06 DIAGNOSIS — R079 Chest pain, unspecified: Secondary | ICD-10-CM | POA: Diagnosis present

## 2016-12-06 DIAGNOSIS — Z803 Family history of malignant neoplasm of breast: Secondary | ICD-10-CM

## 2016-12-06 DIAGNOSIS — Z8673 Personal history of transient ischemic attack (TIA), and cerebral infarction without residual deficits: Secondary | ICD-10-CM

## 2016-12-06 DIAGNOSIS — Z9049 Acquired absence of other specified parts of digestive tract: Secondary | ICD-10-CM

## 2016-12-06 DIAGNOSIS — Z808 Family history of malignant neoplasm of other organs or systems: Secondary | ICD-10-CM

## 2016-12-06 DIAGNOSIS — Z7951 Long term (current) use of inhaled steroids: Secondary | ICD-10-CM

## 2016-12-06 DIAGNOSIS — Z7982 Long term (current) use of aspirin: Secondary | ICD-10-CM

## 2016-12-06 DIAGNOSIS — I509 Heart failure, unspecified: Secondary | ICD-10-CM

## 2016-12-06 LAB — BASIC METABOLIC PANEL
ANION GAP: 11 (ref 5–15)
BUN: 8 mg/dL (ref 6–20)
CHLORIDE: 96 mmol/L — AB (ref 101–111)
CO2: 29 mmol/L (ref 22–32)
Calcium: 8.8 mg/dL — ABNORMAL LOW (ref 8.9–10.3)
Creatinine, Ser: 4.19 mg/dL — ABNORMAL HIGH (ref 0.44–1.00)
GFR calc non Af Amer: 10 mL/min — ABNORMAL LOW (ref >60)
GFR, EST AFRICAN AMERICAN: 11 mL/min — AB (ref >60)
Glucose, Bld: 73 mg/dL (ref 65–99)
Potassium: 3.1 mmol/L — ABNORMAL LOW (ref 3.5–5.1)
Sodium: 136 mmol/L (ref 135–145)

## 2016-12-06 LAB — CBC
HEMATOCRIT: 38.5 % (ref 36.0–46.0)
HEMOGLOBIN: 12.4 g/dL (ref 12.0–15.0)
MCH: 29 pg (ref 26.0–34.0)
MCHC: 32.2 g/dL (ref 30.0–36.0)
MCV: 90 fL (ref 78.0–100.0)
Platelets: 207 10*3/uL (ref 150–400)
RBC: 4.28 MIL/uL (ref 3.87–5.11)
RDW: 16.1 % — ABNORMAL HIGH (ref 11.5–15.5)
WBC: 10.5 10*3/uL (ref 4.0–10.5)

## 2016-12-06 LAB — I-STAT TROPONIN, ED: Troponin i, poc: 0.09 ng/mL (ref 0.00–0.08)

## 2016-12-06 LAB — BRAIN NATRIURETIC PEPTIDE: B NATRIURETIC PEPTIDE 5: 1218 pg/mL — AB (ref 0.0–100.0)

## 2016-12-06 MED ORDER — DEXTROSE 5 % IV SOLN
1.0000 g | Freq: Once | INTRAVENOUS | Status: AC
  Administered 2016-12-06: 1 g via INTRAVENOUS
  Filled 2016-12-06: qty 10

## 2016-12-06 MED ORDER — TECHNETIUM TC 99M DIETHYLENETRIAME-PENTAACETIC ACID
32.8000 | Freq: Once | INTRAVENOUS | Status: AC | PRN
  Administered 2016-12-06: 32.8 via RESPIRATORY_TRACT

## 2016-12-06 MED ORDER — TECHNETIUM TO 99M ALBUMIN AGGREGATED
4.0500 | Freq: Once | INTRAVENOUS | Status: AC | PRN
  Administered 2016-12-06: 4.05 via INTRAVENOUS

## 2016-12-06 MED ORDER — CLONIDINE HCL 0.2 MG PO TABS
0.2000 mg | ORAL_TABLET | Freq: Once | ORAL | Status: AC
  Administered 2016-12-06: 0.2 mg via ORAL
  Filled 2016-12-06: qty 1

## 2016-12-06 MED ORDER — METOPROLOL SUCCINATE ER 50 MG PO TB24
150.0000 mg | ORAL_TABLET | Freq: Once | ORAL | Status: AC
  Administered 2016-12-06: 150 mg via ORAL
  Filled 2016-12-06: qty 1

## 2016-12-06 MED ORDER — LEVOFLOXACIN 500 MG PO TABS
500.0000 mg | ORAL_TABLET | Freq: Once | ORAL | Status: AC
  Administered 2016-12-06: 500 mg via ORAL
  Filled 2016-12-06: qty 1

## 2016-12-06 MED ORDER — ASPIRIN 81 MG PO CHEW
324.0000 mg | CHEWABLE_TABLET | Freq: Once | ORAL | Status: AC
Start: 2016-12-06 — End: 2016-12-06
  Administered 2016-12-06: 324 mg via ORAL
  Filled 2016-12-06: qty 4

## 2016-12-06 MED ORDER — HYDRALAZINE HCL 10 MG PO TABS
10.0000 mg | ORAL_TABLET | Freq: Once | ORAL | Status: AC
  Administered 2016-12-06: 10 mg via ORAL
  Filled 2016-12-06: qty 1

## 2016-12-06 MED ORDER — IOPAMIDOL (ISOVUE-370) INJECTION 76%
INTRAVENOUS | Status: AC
  Administered 2016-12-06: 100 mL
  Filled 2016-12-06: qty 100

## 2016-12-06 NOTE — ED Provider Notes (Signed)
[facility] DEPT Provider Note   CSN: [id number] Arrival date & time: 12/06/16  1611     History   Chief Complaint Chief Complaint  Patient presents with  . Chest Pain  . Shortness of Breath    HPI [name] is a 76 y.o. female.  76yo F w/ extensive PMH including ESRD on HD M/W/F, CHF, HTN, HLD, COPD who p/w chest pain and shortness of breath. Patient states that she has had chest pain which she states feels like it is in her Long's for the past 2 days. The pain is intermittent and worse when she takes a deep breath and. It is associated with occasional shortness of breath and weakness with exertion. She has felt like she was wheezing and has been using her inhalers more frequently recently. She reports 2-3 weeks of a cough that was initially productive but is now dry. No associated fevers. She also badly on Friday that she missed dialysis but did go today and had a full session. She has not taken her medications today but is normally compliant. No recent travel, history of cancer, or history of blood clots.   The history is provided by the patient.  Chest Pain   Associated symptoms include shortness of breath.  Shortness of Breath  Associated symptoms include chest pain.    Past Medical History:  Diagnosis Date  . Abnormality of gait 03/28/2015  . Adrenal mass ([place])   . 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 Arthritis   . Back pain   . CHF (congestive heart failure) ([place])   . Chronic kidney disease    Hemo MWF  . Congestion of throat    Pt states she has a lot mucus in back throat.  . Constipation   . COPD (chronic obstructive pulmonary disease) ([place])   . Depression   . Dialysis patient [facility])    kidney  . Diverticulitis   . GERD (gastroesophageal reflux disease)   . H/O hiatal hernia   . High cholesterol   . Hoarseness of voice   . Hyperlipidemia   . Hypertension   . IBS (irritable bowel syndrome)   . Meralgia  paresthetica of right side 12/26/2014  . Normal cardiac stress test 12/24/2009   lexiscan, imaging normal  . Renal disorder   . RLS (restless legs syndrome)   . Seizures ([place])    2004 past brain surgery  . Sinus complaint   . Thyroid disease   . TIA (transient ischemic attack)   . Tubular adenoma of colon 01/2008    Patient Active Problem List   Diagnosis Date Noted  . Osteopenia 09/08/2016  . Allergic rhinitis 08/07/2016  . BMI 32.0-32.9,adult 08/07/2016  . Onychomycosis 05/09/2016  . Nonspecific chest pain 04/19/2016  . ACS (acute coronary syndrome) ([place]) 04/19/2016  . Leg pain, bilateral 12/05/2015  . Trouble in sleeping 11/22/2015  . End stage renal disease ([place])   . Hyperkalemia 11/12/2015  . ESRD on dialysis ([place])   . Chronic obstructive pulmonary disease ([place])   . Hypertensive urgency 09/20/2015  . Fecal incontinence   . Adverse effects of medication 06/21/2015  . Herpes 06/19/2015  . Skin lesion 06/16/2015  . Recurrent genital herpes 06/16/2015  . Onychocryptosis 04/08/2015  . Renal mass   . End stage renal disease on dialysis ([place])   . Inadequate pain control 12/30/2014  . CHF (congestive heart failure) ([place]) 12/30/2014  . Meralgia paresthetica of right  side 12/26/2014  . Hereditary and idiopathic peripheral neuropathy 02/15/2013  . Dermatitis of face 08/19/2012  . HIP PAIN, BILATERAL 12/07/2008  . BLADDER PROLAPSE 11/17/2007  . OSTEOARTHRITIS, GENERALIZED, MULTIPLE JOINTS 08/18/2007  . 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 ANXIETY STATE, UNSPECIFIED 04/23/2007  . ANEMIA NEC 03/31/2007  . TOBACCO ABUSE 12/10/2006  . HIATAL HERNIA 12/10/2006  . Dysphagia, unspecified(787.20) 12/10/2006  . HYPERCHOLESTEROLEMIA 07/30/2006  . Gout, unspecified 07/30/2006  . Major depression in partial remission ([place]) 07/30/2006  . Hypertensive renal disease, malignant, with renal failure 07/30/2006  . GASTROESOPHAGEAL REFLUX, NO ESOPHAGITIS 07/30/2006     Past Surgical History:  Procedure Laterality Date  . ABDOMINAL HYSTERECTOMY    . ANAL RECTAL MANOMETRY N/A 07/25/2015   Procedure: ANO RECTAL MANOMETRY;  Surgeon: Mauri Pole, MD;  Location: [facility];  Service: Endoscopy;  Laterality: N/A;  . APPENDECTOMY    . AV FISTULA PLACEMENT Left 11/11/2012   Procedure: INSERTION OF ARTERIOVENOUS (AV) GORE-TEX GRAFT ARM;  Surgeon: Angelia Mould, MD;  Location: [facility];  Service: Vascular;  Laterality: Left;  . [place] REMOVAL Left 11/18/2012   Procedure: REMOVAL OF LEFT UPPER ARM ARTERIOVENOUS GORETEX GRAFT ([place]);  Surgeon: Angelia Mould, MD;  Location: [facility];  Service: Vascular;  Laterality: Left;  . CARDIAC CATHETERIZATION  2003   normal  . CHOLECYSTECTOMY     Open mid line incision  . frontal craniotomy  2002   indication = sinusitis  . INSERTION OF DIALYSIS CATHETER     Left  . PATCH ANGIOPLASTY Left 11/18/2012   Procedure: PATCH ANGIOPLASTY;  Surgeon: Angelia Mould, MD;  Location: [facility];  Service: Vascular;  Laterality: Left;  . PERIPHERAL VASCULAR CATHETERIZATION Left 03/08/2015   Procedure: A/V Shuntogram/Fistulagram;  Surgeon: Algernon Huxley, MD;  Location: [facility];  Service: Cardiovascular;  Laterality: Left;  . PERIPHERAL VASCULAR CATHETERIZATION Left 03/08/2015   Procedure: A/V Shunt Intervention;  Surgeon: Algernon Huxley, MD;  Location: [facility];  Service: Cardiovascular;  Laterality: Left;  . RECTAL ULTRASOUND N/A 10/05/2015   Procedure: ANAL ULTRASOUND WITH PROBE;  Surgeon: Leighton Ruff, MD;  Location: [facility];  Service: Endoscopy;  Laterality: N/A;  . REVISON OF ARTERIOVENOUS FISTULA  05/11/2012   Procedure: REVISON OF ARTERIOVENOUS FISTULA;  Surgeon: Elam Dutch, MD;  Location: [facility];  Service: Vascular;  Laterality: Right;  . TUBAL LIGATION      OB History    Gravida Para Term Preterm AB Living   5 3 3          SAB TAB Ectopic Multiple Live Births                    Home Medications    Prior to Admission medications   Medication Sig Start Date End Date Taking? Authorizing Provider  acetaminophen (TYLENOL) 500 MG tablet Take 1,000 mg by mouth every 6 (six) hours as needed for mild pain or moderate pain.   Yes [provider]  albuterol (PROVENTIL HFA;VENTOLIN HFA) 108 (90 Base) MCG/ACT inhaler Inhale 2 puffs into the lungs every 4 (four) hours as needed for wheezing or shortness of breath. 06/05/15  Yes Gottschalk, Leatrice Jewels M, DO  allopurinol (ZYLOPRIM) 100 MG tablet Take 100 mg by mouth once daily on non-dialysis days (Tues/Thurs/Sat/Sun) Patient taking differently: Take 100 mg by mouth See admin instructions. Take one tablet (100 mg) by mouth once daily on non-dialysis days (Tues/Thurs/Sat/Sun) 11/13/15  Yes Smiley Houseman, MD  amLODipine (NORVASC) 10 MG tablet Take 1 tablet (10 mg total) by mouth at bedtime. 11/12/16  Yes [place]ique, Betty G, MD  aspirin EC 81 MG tablet Take 81 mg by mouth daily.    Yes [provider]  atorvastatin (LIPITOR) 40 MG tablet Take 1 tablet (40 mg total) by mouth daily. Patient taking differently: Take 40 mg by mouth at bedtime.  02/26/15  Yes Rumley, [place] N, DO  azelastine (ASTELIN) 0.1 % nasal spray Place 1 spray into both nostrils 2 (two) times daily. Use in each nostril as directed Patient taking differently: Place 1 spray into both nostrils 2 (two) times daily as needed for rhinitis or allergies. Use in each nostril as directed 08/07/16  Yes [place], Betty G, MD  cinacalcet (SENSIPAR) 90 MG tablet Take 90 mg by mouth at bedtime.   Yes [provider]  cloNIDine (CATAPRES) 0.2 MG tablet Take 1 tablet (0.2 mg total) by mouth 3 (three) times daily. Patient taking differently: Take 0.2 mg by mouth 2 (two) times daily.  11/12/16  Yes [place], Betty G, MD  DULoxetine (CYMBALTA) 30 MG capsule Take 2 capsules (60 mg total) by mouth daily. Patient taking differently: Take 60 mg by mouth at bedtime.  11/13/16   Yes Ward Givens, NP  fluticasone (FLONASE) 50 MCG/ACT nasal spray Place 2 sprays into both nostrils daily as needed for allergies or rhinitis. 05/08/16  Yes Rogue Bussing, MD  Homeopathic Products (LEG CRAMP RELIEF PO) Take 1-2 tablets by mouth daily as needed (for leg pain).    Yes [provider]  hydrALAZINE (APRESOLINE) 10 MG tablet Take 1 tablet (10 mg total) by mouth 2 (two) times daily. 11/12/16  Yes [place], Betty G, MD  ipratropium (ATROVENT HFA) 17 MCG/ACT inhaler Inhale 2 puffs into the lungs every 4 (four) hours as needed for wheezing. 06/05/15  Yes Ronnie Doss M, DO  lidocaine-prilocaine (EMLA) cream Apply 1 application topically See admin instructions. Apply topically one hour before dialysis - Monday, Wednesday, Friday   Yes [provider]  loratadine (CLARITIN) 10 MG tablet Take 1 tablet (10 mg total) by mouth daily as needed for allergies. 08/09/16  Yes [place], Betty G, MD  losartan (COZAAR) 100 MG tablet Take 1 tablet (100 mg total) by mouth daily. 11/12/16  Yes [place], Betty G, MD  metoprolol succinate (TOPROL-XL) 100 MG 24 hr tablet Take 150 mg by mouth at bedtime. 12/06/16  Yes [provider]  multivitamin (RENA-VIT) TABS tablet Take 1 tablet by mouth at bedtime. Patient taking differently: Take 1 tablet by mouth daily.  01/01/15  Yes Haney, Alyssa A, MD  omeprazole (PRILOSEC) 40 MG capsule Take 1 capsule (40 mg total) by mouth 2 (two) times daily. 12/27/15  Yes Ladene Artist, MD  pregabalin (LYRICA) 100 MG capsule Take 100 mg by mouth at bedtime.    Yes [provider]  sevelamer carbonate (RENVELA) 800 MG tablet Take 2,400 mg by mouth See admin instructions. Take 3 tablets (2400 mg) by mouth with each meal - 4 times daily   Yes [provider]  Aluminum & Magnesium Hydroxide 600-300 MG/5ML CONC Take 5 mLs by mouth every 6 (six) hours as needed for indigestion. Patient not taking: Reported on 12/06/2016 11/02/15   Tonette Bihari, MD  metoprolol succinate (TOPROL-XL) 50 MG 24 hr tablet Take 1 tablet (50 mg total) by mouth at bedtime. Patient not taking: Reported on 12/06/2016 11/12/16   [place], Betty G, MD  Family History Family History  Problem Relation Age of Onset  . Thyroid cancer Mother   . Heart disease Father   . Hypertension Father   . Heart attack Father   . Lupus Daughter   . Breast cancer Sister   . Thyroid cancer Sister   . Kidney disease Sister   . Colon cancer Neg Hx     Social History Social History  Substance Use Topics  . Smoking status: Current Every Day Smoker    Packs/day: 1.00    Years: 60.00    Types: Cigarettes  . Smokeless tobacco: Never Used     Comment: smoker since 76 yo.  1.5 ppd of [place] 100 lights. ; form given 10-23-16  . Alcohol use No     Allergies   Tuberculin tests; Valacyclovir; Codeine; Penicillins; and Sulfamethoxazole   Review of Systems Review of Systems  Respiratory: Positive for shortness of breath.   Cardiovascular: Positive for chest pain.   All other systems reviewed and are negative except that which was mentioned in HPI   Physical Exam Updated Vital Signs BP (!) 194/55   Pulse 61   Temp 98.5 F (36.9 C) (Oral)   Resp (!) 21   Ht 5\' 1"  (1.549 m)   Wt 81.7 kg (180 lb 1.9 oz)   SpO2 100%   BMI 34.03 kg/m   Physical Exam  Constitutional: She is oriented to person, place, and time. She appears well-developed and well-nourished. No distress.  HENT:  Head: Normocephalic and atraumatic.  Moist mucous membranes  Eyes: Conjunctivae are normal. Pupils are equal, round, and reactive to light.  Neck: Neck supple.  Cardiovascular: Regular rhythm.  Bradycardia present.   Murmur heard. Pulmonary/Chest: Effort normal.  Pain w/ deep inspiration, crackles in bases, no wheezing  Abdominal: Soft. Bowel sounds are normal. She exhibits no distension. There is no tenderness.  Musculoskeletal: She exhibits no edema.  Neurological: She is  alert and oriented to person, place, and time.  Fluent speech  Skin: Skin is warm and dry.  Psychiatric: She has a normal mood and affect. Judgment normal.  Nursing note and vitals reviewed.    ED Treatments / Results  Labs (all labs ordered are listed, but only abnormal results are displayed) Labs Reviewed  BASIC METABOLIC PANEL - Abnormal; Notable for the following:       Result Value   Potassium 3.1 (*)    Chloride 96 (*)    Creatinine, Ser 4.19 (*)    Calcium 8.8 (*)    GFR calc non Af Amer 10 (*)    GFR calc Af Amer 11 (*)    All other components within normal limits  CBC - Abnormal; Notable for the following:    RDW 16.1 (*)    All other components within normal limits  BRAIN NATRIURETIC PEPTIDE - Abnormal; Notable for the following:    B Natriuretic Peptide 1,218.0 (*)    All other components within normal limits  I-STAT TROPOININ, ED - Abnormal; Notable for the following:    Troponin i, poc 0.09 (*)    All other components within normal limits    EKG  EKG Interpretation None       Radiology Dg Chest 2 View  Result Date: 12/06/2016 CLINICAL DATA:  Chest pint and tightness. Shortness of breath for 2 days. EXAM: CHEST  2 VIEW COMPARISON:  04/19/2016 FINDINGS: Low lung volumes with asymmetric elevation of the right hemidiaphragm as before. There is basilar atelectasis with somewhat confluent opacity  in the medial right base raising the question of pneumonia. No pleural effusion. The cardiopericardial silhouette is within normal limits for size. He Ro dialysis graft again noted. The visualized bony structures of the thorax are intact. Radiodense material projecting over the upper abdomen may be related to contrast material within colonic diverticuli. IMPRESSION: Low volume film with bibasilar atelectasis and question of superimposed pneumonia at the right base. Electronically Signed   By: Misty Stanley M.D.   On: 12/06/2016 17:07    Procedures Procedures (including  critical care time)  Medications Ordered in ED Medications  cloNIDine (CATAPRES) tablet 0.2 mg (not administered)  metoprolol succinate (TOPROL-XL) 24 hr tablet 150 mg (not administered)  hydrALAZINE (APRESOLINE) tablet 10 mg (not administered)  cefTRIAXone (ROCEPHIN) 1 g in dextrose 5 % 50 mL IVPB (not administered)  levofloxacin (LEVAQUIN) tablet 500 mg (not administered)  aspirin chewable tablet 324 mg (324 mg Oral Given 12/06/16 1918)  iopamidol (ISOVUE-370) 76 % injection (100 mLs  Contrast Given 12/06/16 1941)     Initial Impression / Assessment and Plan / ED Course  I have reviewed the triage vital signs and the nursing notes.  Pertinent labs & imaging results that were available during my care of the patient were reviewed by me and considered in my medical decision making (see chart for details).    PT w/ pleuritic CP and SOB, Paralyzed weakness for a few days. Had dialysis today. She was nontoxic on exam, vital signs notable for severe hypertension but patient has not had any of her medicines today. EKG without acute ischemic changes.  Chest x-ray shows possible pneumonia right lung base. Patient does have some crackles on exam and has had cough for several weeks, therefore gave ceftriaxone and Levaquin. I am concerned about the possibility of PE given the description of her symptoms. Her troponin is weakly positive at 0.09. This is difficult to interpret given her renal failure but I noted that her troponin has never been positive in past troponin lab work. Her BNP is also significantly elevated at 1218. IV team was able to get some access but patient has had pain with trying to push contrast, therefore they were unable to obtain CTA. Discussed w/ radiologist, Dr. Durene Cal, who has approved V/Q scan.  V/Q scan is pending. I have added repeat troponin which is stable from previous. Discussed Admission with [facility] hospitalist, Dr. Roel Cluck. Pt admitted for further care. Final Clinical  Impressions(s) / ED Diagnoses   Final diagnoses:  Pleuritic chest pain    New Prescriptions New Prescriptions   No medications on file     Jozalyn Baglio, Wenda Overland, MD 12/07/16 [phone]

## 2016-12-06 NOTE — ED Triage Notes (Signed)
Pt c/o pain across diaphragm area ongoing since Thursday with shortness of breath. Pt is on Dialysis MWF. Pt missed dialysis yesterday but went today and completed treatment 30-45 mins PTA.

## 2016-12-06 NOTE — ED Notes (Signed)
JEssica B. @[facility]  notified of elevated I-stat trop

## 2016-12-06 NOTE — H&P (Signed)
[name] TGG:[id number] DOB: 10-May-1941 DOA: 12/06/2016     PCP: Martinique, Betty G, MD   Outpatient Specialists: [facility]  Patient coming from:   home Lives  With family   Chief Complaint: chest pain  HPI: [name] is a 76 y.o. female with medical history significant of ESRD on HD on MWF, peripheral neuropathy and chronic low back pain. GERD, CHF,  COPD, HLD, HTN restless legs syndrome, chronic systolic CHF      Presented with chest pain across subdiaphragmatic and for the past 3 days after she has been coughing for the past few weeks. Associated shortness of breath patient usually on dialysis Monday Wednesday Friday but felt so bad that she have skipped her dialysis yesterday and went in today when she had a complete treatment without any problems. Describes chest pain as feels like the lungs are hurting. Its pleuritic in nature. She have had some dyspnea with exertion usually able to walk several problems. She's been having some wheezing and using her inhalers report some cough for past 2-3 weeks initially was productive of thick sputum now less productive but still very thick mucus. Denies any fever no travel no leg edema she does have history of Tubular adenoma of the colon that was remote no history of blood clots. No fever. No hemoptysis, No sick contacts  In ER attempted to place IV for CTA injury over chest patient was experiencing pain with contrast infusion and CTA G over chest wasn't aborted instead patient is getting VQ scan  She had had admission for chest pain November 2017 and was diagnosed with GERD Regarding pertinent Chronic problems:  On HD for ESRD for the past 5 years due to HTN Last echogram in 2016 showing preserved EF grade 1   diastolic dysfunction  Recently had back injections done last week for chronic back pain  She has esophageal stricture recurring dilatation on June 26 th by Dr Derrill Memo with good results   IN ER:  Temp (24hrs),  Avg:98.5 F (36.9 C), Min:98.5 F (36.9 C), Max:98.5 F (36.9 C)      on arrival  ED Triage Vitals  Enc Vitals Group     BP 12/06/16 1625 (!) 190/55     Pulse Rate 12/06/16 1625 66     Resp 12/06/16 1625 18     Temp 12/06/16 1625 98.5 F (36.9 C)     Temp Source 12/06/16 1625 Oral     SpO2 12/06/16 1625 96 %     Weight 12/06/16 1625 180 lb 1.9 oz (81.7 kg)     Height 12/06/16 1625 5\' 1"  (1.549 m)     Head Circumference --      Peak Flow --      Pain Score 12/06/16 1629 6     Pain Loc --      Pain Edu? --      Excl. in [place]? --    RR 27 100% HR 64 BP 168/57 Trop 0.09   Na 136 K 3.1 Cr 4.19 WBC 10.5 plt 207 BNP 1218 Up from 2017 CXR question of superimposed pneumonia at the right base. Following Medications were ordered in ER: Medications  aspirin chewable tablet 324 mg (324 mg Oral Given 12/06/16 1918)  iopamidol (ISOVUE-370) 76 % injection (100 mLs  Contrast Given 12/06/16 1941)  cloNIDine (CATAPRES) tablet 0.2 mg (0.2 mg Oral Given 12/06/16 2057)  metoprolol succinate (TOPROL-XL) 24 hr tablet 150 mg (150 mg Oral Given 12/06/16  2056)  hydrALAZINE (APRESOLINE) tablet 10 mg (10 mg Oral Given 12/06/16 2054)  cefTRIAXone (ROCEPHIN) 1 g in dextrose 5 % 50 mL IVPB (0 g Intravenous Stopped 12/06/16 2135)  levofloxacin (LEVAQUIN) tablet 500 mg (500 mg Oral Given 12/06/16 2055)     ER provider discussed case with:  Nephrology to agreed for CT angiogram  Hospitalist was called for admission for possible HCAP and plueretic chest pain  Review of Systems:    Pertinent positives include:  fatigue, chest pain, shortness of breath at rest dyspnea on exertion, non-productive cough,  Constitutional:  No weight loss, night sweats, Fevers, chills, weight loss  HEENT:  No headaches, Difficulty swallowing,Tooth/dental problems,Sore throat,  No sneezing, itching, ear ache, nasal congestion, post nasal drip,  Cardio-vascular:  No Orthopnea, PND, anasarca, dizziness, palpitations.no Bilateral lower  extremity swelling  GI:  No heartburn, indigestion, abdominal pain, nausea, vomiting, diarrhea, change in bowel habits, loss of appetite, melena, blood in stool, hematemesis Resp:     No excess mucus, no productive cough, No  No coughing up of blood.No change in color of mucus.No wheezing. Skin:  no rash or lesions. No jaundice GU:  no dysuria, change in color of urine, no urgency or frequency. No straining to urinate.  No flank pain.  Musculoskeletal:  No joint pain or no joint swelling. No decreased range of motion. No back pain.  Psych:  No change in mood or affect. No depression or anxiety. No memory loss.  Neuro: no localizing neurological complaints, no tingling, no weakness, no double vision, no gait abnormality, no slurred speech, no confusion  As per HPI otherwise 10 point review of systems negative.   Past Medical History: Past Medical History:  Diagnosis Date  . Abnormality of gait 03/28/2015  . Adrenal mass ([place])   . 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 Arthritis   . Back pain   . CHF (congestive heart failure) ([place])   . Chronic kidney disease    Hemo MWF  . Congestion of throat    Pt states she has a lot mucus in back throat.  . Constipation   . COPD (chronic obstructive pulmonary disease) ([place])   . Depression   . Dialysis patient [facility])    kidney  . Diverticulitis   . GERD (gastroesophageal reflux disease)   . H/O hiatal hernia   . High cholesterol   . Hoarseness of voice   . Hyperlipidemia   . Hypertension   . IBS (irritable bowel syndrome)   . Meralgia paresthetica of right side 12/26/2014  . Normal cardiac stress test 12/24/2009   lexiscan, imaging normal  . Renal disorder   . RLS (restless legs syndrome)   . Seizures ([place])    2004 past brain surgery  . Sinus complaint   . Thyroid disease   . TIA (transient ischemic attack)   . Tubular adenoma of colon 01/2008   Past Surgical History:  Procedure Laterality  Date  . ABDOMINAL HYSTERECTOMY    . ANAL RECTAL MANOMETRY N/A 07/25/2015   Procedure: ANO RECTAL MANOMETRY;  Surgeon: Mauri Pole, MD;  Location: [facility];  Service: Endoscopy;  Laterality: N/A;  . APPENDECTOMY    . AV FISTULA PLACEMENT Left 11/11/2012   Procedure: INSERTION OF ARTERIOVENOUS (AV) GORE-TEX GRAFT ARM;  Surgeon: Angelia Mould, MD;  Location: [place];  Service: Vascular;  Laterality: Left;  . [place] REMOVAL Left 11/18/2012   Procedure: REMOVAL OF LEFT  UPPER ARM ARTERIOVENOUS GORETEX GRAFT ([place]);  Surgeon: Angelia Mould, MD;  Location: [place];  Service: Vascular;  Laterality: Left;  . CARDIAC CATHETERIZATION  2003   normal  . CHOLECYSTECTOMY     Open mid line incision  . frontal craniotomy  2002   indication = sinusitis  . INSERTION OF DIALYSIS CATHETER     Left  . PATCH ANGIOPLASTY Left 11/18/2012   Procedure: PATCH ANGIOPLASTY;  Surgeon: Angelia Mould, MD;  Location: [place];  Service: Vascular;  Laterality: Left;  . PERIPHERAL VASCULAR CATHETERIZATION Left 03/08/2015   Procedure: A/V Shuntogram/Fistulagram;  Surgeon: Algernon Huxley, MD;  Location: [place] [facility];  Service: Cardiovascular;  Laterality: Left;  . PERIPHERAL VASCULAR CATHETERIZATION Left 03/08/2015   Procedure: A/V Shunt Intervention;  Surgeon: Algernon Huxley, MD;  Location: [facility] [facility];  Service: Cardiovascular;  Laterality: Left;  . RECTAL ULTRASOUND N/A 10/05/2015   Procedure: ANAL ULTRASOUND WITH PROBE;  Surgeon: Leighton Ruff, MD;  [facility];  Service: Endoscopy;  Laterality: N/A;  . REVISON OF ARTERIOVENOUS FISTULA  05/11/2012   Procedure: REVISON OF ARTERIOVENOUS FISTULA;  Surgeon: Elam Dutch, MD;  Location: [place];  Service: Vascular;  Laterality: Right;  . TUBAL LIGATION       Social History:      reports that she has been smoking Cigarettes.  She has a 60.00 pack-year smoking history. She has never used smokeless tobacco. She reports that she does  not drink alcohol or use drugs.  Allergies:   Allergies  Allergen Reactions  . Tuberculin Tests Hives    "blisters"  . Valacyclovir Other (See Comments)    Confusion and nervousness  . Codeine Nausea And Vomiting  . Penicillins Rash    No problems breathing. Has tolerated omnicef in past without issue Has patient had a PCN reaction causing immediate rash, facial/tongue/throat swelling, SOB or lightheadedness with hypotension: Yes Has patient had a PCN reaction causing severe rash involving mucus membranes or skin necrosis: No Has patient had a PCN reaction that required hospitalization No Has patient had a PCN reaction occurring within the last 10 years: No If all of the above answers are "NO", then may proceed with Cephalosporin  . Sulfamethoxazole Rash       Family History:   Family History  Problem Relation Age of Onset  . Thyroid cancer Mother   . Heart disease Father   . Hypertension Father   . Heart attack Father   . Lupus Daughter   . Breast cancer Sister   . Thyroid cancer Sister   . Kidney disease Sister   . Colon cancer Neg Hx     Medications: Prior to Admission medications   Medication Sig Start Date End Date Taking? Authorizing Provider  acetaminophen (TYLENOL) 500 MG tablet Take 1,000 mg by mouth every 6 (six) hours as needed for mild pain or moderate pain.   Yes [provider]  albuterol (PROVENTIL HFA;VENTOLIN HFA) 108 (90 Base) MCG/ACT inhaler Inhale 2 puffs into the lungs every 4 (four) hours as needed for wheezing or shortness of breath. 06/05/15  Yes Gottschalk, Leatrice Jewels M, DO  allopurinol (ZYLOPRIM) 100 MG tablet Take 100 mg by mouth once daily on non-dialysis days (Tues/Thurs/Sat/Sun) Patient taking differently: Take 100 mg by mouth See admin instructions. Take one tablet (100 mg) by mouth once daily on non-dialysis days (Tues/Thurs/Sat/Sun) 11/13/15  Yes Smiley Houseman, MD  amLODipine (NORVASC) 10 MG tablet Take 1 tablet (10 mg total)  by  mouth at bedtime. 11/12/16  Yes Martinique, Betty G, MD  aspirin EC 81 MG tablet Take 81 mg by mouth daily.    Yes [provider]  atorvastatin (LIPITOR) 40 MG tablet Take 1 tablet (40 mg total) by mouth daily. Patient taking differently: Take 40 mg by mouth at bedtime.  02/26/15  Yes Rumley, [place] N, DO  azelastine (ASTELIN) 0.1 % nasal spray Place 1 spray into both nostrils 2 (two) times daily. Use in each nostril as directed Patient taking differently: Place 1 spray into both nostrils 2 (two) times daily as needed for rhinitis or allergies. Use in each nostril as directed 08/07/16  Yes Martinique, Betty G, MD  cinacalcet (SENSIPAR) 90 MG tablet Take 90 mg by mouth at bedtime.   Yes [provider]  cloNIDine (CATAPRES) 0.2 MG tablet Take 1 tablet (0.2 mg total) by mouth 3 (three) times daily. Patient taking differently: Take 0.2 mg by mouth 2 (two) times daily.  11/12/16  Yes Martinique, Betty G, MD  DULoxetine (CYMBALTA) 30 MG capsule Take 2 capsules (60 mg total) by mouth daily. Patient taking differently: Take 60 mg by mouth at bedtime.  11/13/16  Yes Ward Givens, NP  fluticasone (FLONASE) 50 MCG/ACT nasal spray Place 2 sprays into both nostrils daily as needed for allergies or rhinitis. 05/08/16  Yes Rogue Bussing, MD  Homeopathic Products (LEG CRAMP RELIEF PO) Take 1-2 tablets by mouth daily as needed (for leg pain).    Yes [provider]  hydrALAZINE (APRESOLINE) 10 MG tablet Take 1 tablet (10 mg total) by mouth 2 (two) times daily. 11/12/16  Yes Martinique, Betty G, MD  ipratropium (ATROVENT HFA) 17 MCG/ACT inhaler Inhale 2 puffs into the lungs every 4 (four) hours as needed for wheezing. 06/05/15  Yes Ronnie Doss M, DO  lidocaine-prilocaine (EMLA) cream Apply 1 application topically See admin instructions. Apply topically one hour before dialysis - Monday, Wednesday, Friday   Yes [provider]  loratadine (CLARITIN) 10 MG tablet Take 1 tablet (10 mg  total) by mouth daily as needed for allergies. 08/09/16  Yes Martinique, Betty G, MD  losartan (COZAAR) 100 MG tablet Take 1 tablet (100 mg total) by mouth daily. 11/12/16  Yes Martinique, Betty G, MD  metoprolol succinate (TOPROL-XL) 100 MG 24 hr tablet Take 150 mg by mouth at bedtime. 12/06/16  Yes [provider]  multivitamin (RENA-VIT) TABS tablet Take 1 tablet by mouth at bedtime. Patient taking differently: Take 1 tablet by mouth daily.  01/01/15  Yes Haney, Alyssa A, MD  omeprazole (PRILOSEC) 40 MG capsule Take 1 capsule (40 mg total) by mouth 2 (two) times daily. 12/27/15  Yes Ladene Artist, MD  pregabalin (LYRICA) 100 MG capsule Take 100 mg by mouth at bedtime.    Yes [provider]  sevelamer carbonate (RENVELA) 800 MG tablet Take 2,400 mg by mouth See admin instructions. Take 3 tablets (2400 mg) by mouth with each meal - 4 times daily   Yes [provider]  Aluminum & Magnesium Hydroxide 600-300 MG/5ML CONC Take 5 mLs by mouth every 6 (six) hours as needed for indigestion. Patient not taking: Reported on 12/06/2016 11/02/15   Tonette Bihari, MD  metoprolol succinate (TOPROL-XL) 50 MG 24 hr tablet Take 1 tablet (50 mg total) by mouth at bedtime. Patient not taking: Reported on 12/06/2016 11/12/16   Martinique, Betty G, MD    Physical Exam: Patient Vitals for the past 24 hrs:  BP  Temp Temp src Pulse Resp SpO2 Height Weight  12/06/16 2200 (!) 168/57 - - 64 (!) 27 100 % - -  12/06/16 2100 (!) 206/58 - - 71 18 100 % - -  12/06/16 2054 (!) 183/53 - - - - - - -  12/06/16 1830 (!) 194/55 - - 61 (!) 21 100 % - -  12/06/16 1815 (!) 199/54 - - 66 (!) 24 100 % - -  12/06/16 1807 (!) 211/59 - - 64 (!) 21 100 % - -  12/06/16 1625 (!) 190/55 98.5 F (36.9 C) Oral 66 18 96 % 5\' 1"  (1.549 m) 81.7 kg (180 lb 1.9 oz)    1. General:  in No Acute distress 2. Psychological: Alert and   Oriented 3. Head/ENT:   Moist   Mucous Membranes                          Head Non traumatic, neck  supple                            Poor Dentition 4. SKIN: normal   Skin turgor,  Skin clean Dry and intact no rash 5. Heart: Regular rate and rhythm no   Murmur, Rub or gallop 6. Lungs:  no wheezes some crackles   7. Abdomen: Soft, non-tender, Non distended 8. Lower extremities: no clubbing, cyanosis, or edema 9. Neurologically Grossly intact, moving all 4 extremities equally   10. MSK: Normal range of motion   body mass index is 34.03 kg/m.  Labs on Admission:   Labs on Admission: I have personally reviewed following labs and imaging studies  CBC:  Recent Labs Lab 12/06/16 1620  WBC 10.5  HGB 12.4  HCT 38.5  MCV 90.0  PLT 767   Basic Metabolic Panel:  Recent Labs Lab 12/06/16 1620  NA 136  K 3.1*  CL 96*  CO2 29  GLUCOSE 73  BUN 8  CREATININE 4.19*  CALCIUM 8.8*   GFR: Estimated Creatinine Clearance: 11.2 mL/min (A) (by C-G formula based on SCr of 4.19 mg/dL (H)). Liver Function Tests: No results for input(s): AST, ALT, ALKPHOS, BILITOT, PROT, ALBUMIN in the last 168 hours. No results for input(s): LIPASE, AMYLASE in the last 168 hours. No results for input(s): AMMONIA in the last 168 hours. Coagulation Profile: No results for input(s): INR, PROTIME in the last 168 hours. Cardiac Enzymes: No results for input(s): CKTOTAL, CKMB, CKMBINDEX, TROPONINI in the last 168 hours. BNP (last 3 results) No results for input(s): PROBNP in the last 8760 hours. HbA1C: No results for input(s): HGBA1C in the last 72 hours. CBG: No results for input(s): GLUCAP in the last 168 hours. Lipid Profile: No results for input(s): CHOL, HDL, LDLCALC, TRIG, CHOLHDL, LDLDIRECT in the last 72 hours. Thyroid Function Tests: No results for input(s): TSH, T4TOTAL, FREET4, T3FREE, THYROIDAB in the last 72 hours. Anemia Panel: No results for input(s): VITAMINB12, FOLATE, FERRITIN, TIBC, IRON, RETICCTPCT in the last 72 hours. Urine analysis:  Sepsis  Labs: @LABRCNTIP (procalcitonin:4,lacticidven:4) )No results found for this or any previous visit (from the past 240 hour(s)).    UA does not make enough urine  Lab Results  Component Value Date   HGBA1C  05/06/2010    5.5 (NOTE)  According to the ADA Clinical Practice Recommendations for 2011, when HbA1c is used as a screening test:   >=6.5%   Diagnostic of Diabetes Mellitus           (if abnormal result  is confirmed)  5.7-6.4%   Increased risk of developing Diabetes Mellitus  References:Diagnosis and Classification of Diabetes Mellitus,Diabetes HMCN,4709,62(EZMOQ 1):S62-S69 and Standards of Medical Care in         Diabetes - 2011,Diabetes HUTM,5465,03  (Suppl 1):S11-S61.    Estimated Creatinine Clearance: 11.2 mL/min (A) (by C-G formula based on SCr of 4.19 mg/dL (H)).  BNP (last 3 results) No results for input(s): PROBNP in the last 8760 hours.   ECG REPORT  Independently reviewed Rate:63  Rhythm: NSR ST&T Change: No acute ischemic changes  QTC 478  Filed Weights   12/06/16 1625  Weight: 81.7 kg (180 lb 1.9 oz)     Cultures:    Component Value Date/Time   SDES URINE, CATHETERIZED 11/14/2013 1549   SPECREQUEST NONE 11/14/2013 1549   CULT NO GROWTH Performed at [facility] 11/14/2013 1549   REPTSTATUS 11/15/2013 FINAL 11/14/2013 1549     Radiological Exams on Admission: Dg Chest 2 View  Result Date: 12/06/2016 CLINICAL DATA:  Chest pint and tightness. Shortness of breath for 2 days. EXAM: CHEST  2 VIEW COMPARISON:  04/19/2016 FINDINGS: Low lung volumes with asymmetric elevation of the right hemidiaphragm as before. There is basilar atelectasis with somewhat confluent opacity in the medial right base raising the question of pneumonia. No pleural effusion. The cardiopericardial silhouette is within normal limits for size. He Ro dialysis graft again noted. The visualized bony structures of the  thorax are intact. Radiodense material projecting over the upper abdomen may be related to contrast material within colonic diverticuli. IMPRESSION: Low volume film with bibasilar atelectasis and question of superimposed pneumonia at the right base. Electronically Signed   By: Misty Stanley M.D.   On: 12/06/2016 17:07    Chart has been reviewed    Assessment/Plan   76 y.o. female with medical history significant of ESRD on HD on MWF, peripheral neuropathy and chronic low back pain. GERD, CHF,  COPD, HLD, HTN restless legs syndrome, chronic systolic CHF  Admitted for a repeat chest pain and shortness of breath if possible HCAP Noted to have elevated BNP and troponin  Present on Admission: . End stage renal disease [facility]) - notified nephrology by msg that patient has been admitted gets dialysis on Monday Wednesday Friday currently does not appear to be fluid overloaded no evidence of hyperkalemia . Nonspecific chest pain - pleuritic in the setting of HCAP VQ scan negative for PE currently improving . Elevated troponin - hold chest pain no concerning for related to pulmonary findings. Troponin stable. Obtain echogram if concern he would benefit from cardiology consult . Chronic obstructive pulmonary disease (HCC) unstable make sure isn't. Medications . HCAP (healthcare-associated pneumonia) - treated cefepime and Vanco await results of sputum cultures . Hypokalemia - given end-stage renal disease will defer to nephrology.  Currently no changes on EKG repeat in the morning if persistently hypokalemic may need to replace gently . Chronic systolic CHF (congestive heart failure) (HCC) - currently does not appear to be fluid overloaded . GASTROESOPHAGEAL REFLUX, NO ESOPHAGITIS C home medications currently stable History of esophageal stricture status post recent dilatation currently stable . HYPERCHOLESTEROLEMIA, -  stable continue home medications Hypertension stable restart home medications and  monitor vitals  . TOBACCO ABUSE -Cost at length importance  of quitting will write for nicotine patch and tobacco cessation protocol    Other plan as per orders.  DVT prophylaxis:   Lovenox     Code Status:  FULL CODE   as per patient    Family Communication:   Family   at  Bedside  plan of care was discussed with   Daughter  Disposition Plan:     To home once workup is complete and patient is stable                           Consults called: Notified Nephrology     Admission status:    Inpatient    Level of care     tele          I have spent a total of 66 min on this admission   extra time was spent to discuss case with consultants  [place] 12/07/2016, 1:14 AM    [facility]  Pager [phone]   after 2 AM please page floor coverage PA If 7AM-7PM, please contact the day team taking care of the patient  [email or web address]  Password [email or web address]

## 2016-12-06 NOTE — ED Notes (Signed)
Lab will add on BNP  °

## 2016-12-06 NOTE — ED Notes (Signed)
Patient transported for radiology

## 2016-12-07 ENCOUNTER — Encounter: Payer: Self-pay | Admitting: Gastroenterology

## 2016-12-07 ENCOUNTER — Encounter (HOSPITAL_COMMUNITY): Payer: Self-pay | Admitting: Internal Medicine

## 2016-12-07 DIAGNOSIS — R079 Chest pain, unspecified: Secondary | ICD-10-CM

## 2016-12-07 DIAGNOSIS — F17210 Nicotine dependence, cigarettes, uncomplicated: Secondary | ICD-10-CM | POA: Diagnosis present

## 2016-12-07 DIAGNOSIS — J449 Chronic obstructive pulmonary disease, unspecified: Secondary | ICD-10-CM

## 2016-12-07 DIAGNOSIS — R1310 Dysphagia, unspecified: Secondary | ICD-10-CM | POA: Diagnosis not present

## 2016-12-07 DIAGNOSIS — J189 Pneumonia, unspecified organism: Secondary | ICD-10-CM | POA: Diagnosis not present

## 2016-12-07 DIAGNOSIS — I5022 Chronic systolic (congestive) heart failure: Secondary | ICD-10-CM | POA: Diagnosis not present

## 2016-12-07 DIAGNOSIS — K5792 Diverticulitis of intestine, part unspecified, without perforation or abscess without bleeding: Secondary | ICD-10-CM | POA: Diagnosis present

## 2016-12-07 DIAGNOSIS — I132 Hypertensive heart and chronic kidney disease with heart failure and with stage 5 chronic kidney disease, or end stage renal disease: Secondary | ICD-10-CM | POA: Diagnosis present

## 2016-12-07 DIAGNOSIS — Z992 Dependence on renal dialysis: Secondary | ICD-10-CM | POA: Diagnosis not present

## 2016-12-07 DIAGNOSIS — F17200 Nicotine dependence, unspecified, uncomplicated: Secondary | ICD-10-CM

## 2016-12-07 DIAGNOSIS — R748 Abnormal levels of other serum enzymes: Secondary | ICD-10-CM

## 2016-12-07 DIAGNOSIS — J440 Chronic obstructive pulmonary disease with acute lower respiratory infection: Secondary | ICD-10-CM | POA: Diagnosis present

## 2016-12-07 DIAGNOSIS — N2581 Secondary hyperparathyroidism of renal origin: Secondary | ICD-10-CM | POA: Diagnosis present

## 2016-12-07 DIAGNOSIS — G2581 Restless legs syndrome: Secondary | ICD-10-CM | POA: Diagnosis present

## 2016-12-07 DIAGNOSIS — I248 Other forms of acute ischemic heart disease: Secondary | ICD-10-CM | POA: Diagnosis present

## 2016-12-07 DIAGNOSIS — N186 End stage renal disease: Secondary | ICD-10-CM | POA: Diagnosis not present

## 2016-12-07 DIAGNOSIS — E876 Hypokalemia: Secondary | ICD-10-CM

## 2016-12-07 DIAGNOSIS — Z90710 Acquired absence of both cervix and uterus: Secondary | ICD-10-CM | POA: Diagnosis not present

## 2016-12-07 DIAGNOSIS — K219 Gastro-esophageal reflux disease without esophagitis: Secondary | ICD-10-CM | POA: Diagnosis not present

## 2016-12-07 DIAGNOSIS — Z8249 Family history of ischemic heart disease and other diseases of the circulatory system: Secondary | ICD-10-CM | POA: Diagnosis not present

## 2016-12-07 DIAGNOSIS — I5042 Chronic combined systolic (congestive) and diastolic (congestive) heart failure: Secondary | ICD-10-CM | POA: Diagnosis present

## 2016-12-07 DIAGNOSIS — E7800 Pure hypercholesterolemia, unspecified: Secondary | ICD-10-CM | POA: Diagnosis present

## 2016-12-07 DIAGNOSIS — Y95 Nosocomial condition: Secondary | ICD-10-CM | POA: Diagnosis present

## 2016-12-07 DIAGNOSIS — Z841 Family history of disorders of kidney and ureter: Secondary | ICD-10-CM | POA: Diagnosis not present

## 2016-12-07 DIAGNOSIS — Z803 Family history of malignant neoplasm of breast: Secondary | ICD-10-CM | POA: Diagnosis not present

## 2016-12-07 DIAGNOSIS — M8580 Other specified disorders of bone density and structure, unspecified site: Secondary | ICD-10-CM | POA: Diagnosis present

## 2016-12-07 DIAGNOSIS — E785 Hyperlipidemia, unspecified: Secondary | ICD-10-CM | POA: Diagnosis present

## 2016-12-07 DIAGNOSIS — Z7951 Long term (current) use of inhaled steroids: Secondary | ICD-10-CM | POA: Diagnosis not present

## 2016-12-07 DIAGNOSIS — R0781 Pleurodynia: Secondary | ICD-10-CM | POA: Diagnosis not present

## 2016-12-07 DIAGNOSIS — Z8673 Personal history of transient ischemic attack (TIA), and cerebral infarction without residual deficits: Secondary | ICD-10-CM | POA: Diagnosis not present

## 2016-12-07 DIAGNOSIS — Z9049 Acquired absence of other specified parts of digestive tract: Secondary | ICD-10-CM | POA: Diagnosis not present

## 2016-12-07 DIAGNOSIS — Z808 Family history of malignant neoplasm of other organs or systems: Secondary | ICD-10-CM | POA: Diagnosis not present

## 2016-12-07 DIAGNOSIS — I5032 Chronic diastolic (congestive) heart failure: Secondary | ICD-10-CM | POA: Diagnosis not present

## 2016-12-07 DIAGNOSIS — Z7982 Long term (current) use of aspirin: Secondary | ICD-10-CM | POA: Diagnosis not present

## 2016-12-07 LAB — COMPREHENSIVE METABOLIC PANEL
ALBUMIN: 2.8 g/dL — AB (ref 3.5–5.0)
ALK PHOS: 68 U/L (ref 38–126)
ALT: 10 U/L — ABNORMAL LOW (ref 14–54)
ANION GAP: 10 (ref 5–15)
AST: 13 U/L — ABNORMAL LOW (ref 15–41)
BILIRUBIN TOTAL: 0.8 mg/dL (ref 0.3–1.2)
BUN: 16 mg/dL (ref 6–20)
CALCIUM: 8.8 mg/dL — AB (ref 8.9–10.3)
CO2: 28 mmol/L (ref 22–32)
Chloride: 98 mmol/L — ABNORMAL LOW (ref 101–111)
Creatinine, Ser: 5.73 mg/dL — ABNORMAL HIGH (ref 0.44–1.00)
GFR calc Af Amer: 8 mL/min — ABNORMAL LOW (ref >60)
GFR, EST NON AFRICAN AMERICAN: 7 mL/min — AB (ref >60)
GLUCOSE: 71 mg/dL (ref 65–99)
Potassium: 4 mmol/L (ref 3.5–5.1)
Sodium: 136 mmol/L (ref 135–145)
TOTAL PROTEIN: 7.2 g/dL (ref 6.5–8.1)

## 2016-12-07 LAB — CBC
HCT: 35.6 % — ABNORMAL LOW (ref 36.0–46.0)
HEMOGLOBIN: 11.2 g/dL — AB (ref 12.0–15.0)
MCH: 28.4 pg (ref 26.0–34.0)
MCHC: 31.5 g/dL (ref 30.0–36.0)
MCV: 90.1 fL (ref 78.0–100.0)
Platelets: 183 10*3/uL (ref 150–400)
RBC: 3.95 MIL/uL (ref 3.87–5.11)
RDW: 16.2 % — AB (ref 11.5–15.5)
WBC: 6.9 10*3/uL (ref 4.0–10.5)

## 2016-12-07 LAB — TROPONIN I
TROPONIN I: 0.11 ng/mL — AB (ref <0.03)
TROPONIN I: 0.08 ng/mL — AB (ref <0.03)
Troponin I: 0.09 ng/mL (ref <0.03)

## 2016-12-07 LAB — MRSA PCR SCREENING: MRSA BY PCR: NEGATIVE

## 2016-12-07 LAB — PHOSPHORUS: Phosphorus: 3.6 mg/dL (ref 2.5–4.6)

## 2016-12-07 LAB — I-STAT TROPONIN, ED: Troponin i, poc: 0.09 ng/mL (ref 0.00–0.08)

## 2016-12-07 LAB — STREP PNEUMONIAE URINARY ANTIGEN: Strep Pneumo Urinary Antigen: NEGATIVE

## 2016-12-07 LAB — HIV ANTIBODY (ROUTINE TESTING W REFLEX): HIV SCREEN 4TH GENERATION: NONREACTIVE

## 2016-12-07 LAB — TSH: TSH: 1.464 u[IU]/mL (ref 0.350–4.500)

## 2016-12-07 LAB — MAGNESIUM: MAGNESIUM: 2 mg/dL (ref 1.7–2.4)

## 2016-12-07 MED ORDER — GUAIFENESIN ER 600 MG PO TB12
600.0000 mg | ORAL_TABLET | Freq: Two times a day (BID) | ORAL | Status: DC
  Administered 2016-12-07 – 2016-12-08 (×4): 600 mg via ORAL
  Filled 2016-12-07 (×4): qty 1

## 2016-12-07 MED ORDER — FAMOTIDINE 20 MG PO TABS
20.0000 mg | ORAL_TABLET | Freq: Every day | ORAL | Status: DC
  Administered 2016-12-07 – 2016-12-08 (×2): 20 mg via ORAL
  Filled 2016-12-07 (×3): qty 1

## 2016-12-07 MED ORDER — METOPROLOL SUCCINATE ER 50 MG PO TB24
150.0000 mg | ORAL_TABLET | Freq: Every day | ORAL | Status: DC
  Administered 2016-12-07: 21:00:00 150 mg via ORAL
  Filled 2016-12-07: qty 1

## 2016-12-07 MED ORDER — CINACALCET HCL 30 MG PO TABS: 90.0000 mg | ORAL_TABLET | Freq: Every day | ORAL | Status: DC

## 2016-12-07 MED ORDER — SODIUM CHLORIDE 0.9% FLUSH
3.0000 mL | Freq: Two times a day (BID) | INTRAVENOUS | Status: DC
  Administered 2016-12-07: 3 mL via INTRAVENOUS

## 2016-12-07 MED ORDER — ENOXAPARIN SODIUM 30 MG/0.3ML ~~LOC~~ SOLN
30.0000 mg | SUBCUTANEOUS | Status: DC
  Administered 2016-12-07 – 2016-12-08 (×2): 30 mg via SUBCUTANEOUS
  Filled 2016-12-07 (×2): qty 0.3

## 2016-12-07 MED ORDER — DULOXETINE HCL 60 MG PO CPEP
60.0000 mg | ORAL_CAPSULE | Freq: Every day | ORAL | Status: DC
  Administered 2016-12-07: 60 mg via ORAL
  Filled 2016-12-07: qty 1

## 2016-12-07 MED ORDER — IPRATROPIUM BROMIDE 0.02 % IN SOLN
0.5000 mg | Freq: Four times a day (QID) | RESPIRATORY_TRACT | Status: DC
  Filled 2016-12-07: qty 2.5

## 2016-12-07 MED ORDER — ONDANSETRON HCL 4 MG/2ML IJ SOLN: 4.0000 mg | Freq: Four times a day (QID) | INTRAMUSCULAR | Status: DC | PRN

## 2016-12-07 MED ORDER — ONDANSETRON HCL 4 MG PO TABS: 4.0000 mg | ORAL_TABLET | Freq: Four times a day (QID) | ORAL | Status: DC | PRN

## 2016-12-07 MED ORDER — SODIUM CHLORIDE 0.9% FLUSH: 3.0000 mL | INTRAVENOUS | Status: DC | PRN

## 2016-12-07 MED ORDER — VANCOMYCIN HCL IN DEXTROSE 750-5 MG/150ML-% IV SOLN
750.0000 mg | INTRAVENOUS | Status: DC
Start: 2016-12-08 — End: 2016-12-08
  Administered 2016-12-08: 750 mg via INTRAVENOUS
  Filled 2016-12-07: qty 150

## 2016-12-07 MED ORDER — SEVELAMER CARBONATE 800 MG PO TABS
2400.0000 mg | ORAL_TABLET | Freq: Three times a day (TID) | ORAL | Status: DC
  Administered 2016-12-07 – 2016-12-08 (×5): 2400 mg via ORAL
  Filled 2016-12-07 (×6): qty 3

## 2016-12-07 MED ORDER — CINACALCET HCL 30 MG PO TABS
60.0000 mg | ORAL_TABLET | Freq: Every day | ORAL | Status: DC
  Administered 2016-12-07: 60 mg via ORAL
  Filled 2016-12-07: qty 2

## 2016-12-07 MED ORDER — HYDRALAZINE HCL 10 MG PO TABS
10.0000 mg | ORAL_TABLET | Freq: Two times a day (BID) | ORAL | Status: DC
  Administered 2016-12-07 – 2016-12-08 (×4): 10 mg via ORAL
  Filled 2016-12-07 (×5): qty 1

## 2016-12-07 MED ORDER — ASPIRIN EC 81 MG PO TBEC
81.0000 mg | DELAYED_RELEASE_TABLET | Freq: Every day | ORAL | Status: DC
  Administered 2016-12-07 – 2016-12-08 (×2): 81 mg via ORAL
  Filled 2016-12-07 (×2): qty 1

## 2016-12-07 MED ORDER — ALUM HYDROXIDE-MAG CARBONATE 95-358 MG/15ML PO SUSP
15.0000 mL | ORAL | Status: DC | PRN
  Administered 2016-12-07: 15 mL via ORAL
  Filled 2016-12-07 (×2): qty 15

## 2016-12-07 MED ORDER — IPRATROPIUM BROMIDE 0.02 % IN SOLN: 0.5000 mg | Freq: Four times a day (QID) | RESPIRATORY_TRACT | Status: DC | PRN

## 2016-12-07 MED ORDER — ALBUTEROL SULFATE (2.5 MG/3ML) 0.083% IN NEBU: 2.5000 mg | INHALATION_SOLUTION | RESPIRATORY_TRACT | Status: DC | PRN

## 2016-12-07 MED ORDER — DEXTROSE 5 % IV SOLN
2.0000 g | INTRAVENOUS | Status: DC
  Filled 2016-12-07: qty 2

## 2016-12-07 MED ORDER — CEFEPIME HCL 1 G IJ SOLR
1.0000 g | Freq: Three times a day (TID) | INTRAMUSCULAR | Status: DC
  Filled 2016-12-07: qty 1

## 2016-12-07 MED ORDER — VANCOMYCIN HCL 10 G IV SOLR
1500.0000 mg | Freq: Once | INTRAVENOUS | Status: AC
  Administered 2016-12-07: 1500 mg via INTRAVENOUS
  Filled 2016-12-07: qty 1500

## 2016-12-07 MED ORDER — HYDROCODONE-ACETAMINOPHEN 5-325 MG PO TABS: 1.0000 | ORAL_TABLET | ORAL | Status: DC | PRN

## 2016-12-07 MED ORDER — ATORVASTATIN CALCIUM 40 MG PO TABS
40.0000 mg | ORAL_TABLET | Freq: Every day | ORAL | Status: DC
  Administered 2016-12-07: 40 mg via ORAL
  Filled 2016-12-07: qty 1

## 2016-12-07 MED ORDER — NEPRO/CARBSTEADY PO LIQD: 237.0000 mL | Freq: Two times a day (BID) | ORAL | Status: DC

## 2016-12-07 MED ORDER — PANTOPRAZOLE SODIUM 40 MG PO TBEC
40.0000 mg | DELAYED_RELEASE_TABLET | Freq: Every day | ORAL | Status: DC
  Administered 2016-12-07 – 2016-12-08 (×2): 40 mg via ORAL
  Filled 2016-12-07 (×2): qty 1

## 2016-12-07 MED ORDER — RENA-VITE PO TABS
1.0000 | ORAL_TABLET | Freq: Every day | ORAL | Status: DC
  Administered 2016-12-07: 1 via ORAL
  Filled 2016-12-07: qty 1

## 2016-12-07 MED ORDER — DOXERCALCIFEROL 4 MCG/2ML IV SOLN
4.0000 ug | INTRAVENOUS | Status: DC
  Administered 2016-12-08: 4 ug via INTRAVENOUS

## 2016-12-07 MED ORDER — CEFEPIME HCL 2 G IJ SOLR
2.0000 g | Freq: Once | INTRAMUSCULAR | Status: AC
  Administered 2016-12-07: 2 g via INTRAVENOUS
  Filled 2016-12-07: qty 2

## 2016-12-07 MED ORDER — AMLODIPINE BESYLATE 10 MG PO TABS
10.0000 mg | ORAL_TABLET | Freq: Every day | ORAL | Status: DC
  Administered 2016-12-07: 10 mg via ORAL
  Filled 2016-12-07: qty 1

## 2016-12-07 MED ORDER — LOSARTAN POTASSIUM 50 MG PO TABS
100.0000 mg | ORAL_TABLET | Freq: Every day | ORAL | Status: DC
  Administered 2016-12-07 – 2016-12-08 (×2): 100 mg via ORAL
  Filled 2016-12-07 (×2): qty 2

## 2016-12-07 MED ORDER — ACETAMINOPHEN 325 MG PO TABS
650.0000 mg | ORAL_TABLET | Freq: Four times a day (QID) | ORAL | Status: DC | PRN
  Administered 2016-12-08: 650 mg via ORAL

## 2016-12-07 MED ORDER — ALLOPURINOL 100 MG PO TABS
100.0000 mg | ORAL_TABLET | ORAL | Status: DC
  Administered 2016-12-07: 100 mg via ORAL
  Filled 2016-12-07: qty 1

## 2016-12-07 MED ORDER — CLONIDINE HCL 0.2 MG PO TABS
0.2000 mg | ORAL_TABLET | Freq: Two times a day (BID) | ORAL | Status: DC
  Administered 2016-12-07 – 2016-12-08 (×4): 0.2 mg via ORAL
  Filled 2016-12-07 (×4): qty 1

## 2016-12-07 MED ORDER — SODIUM CHLORIDE 0.9 % IV SOLN: 250.0000 mL | INTRAVENOUS | Status: DC | PRN

## 2016-12-07 MED ORDER — NICOTINE 21 MG/24HR TD PT24
21.0000 mg | MEDICATED_PATCH | Freq: Every day | TRANSDERMAL | Status: DC
  Administered 2016-12-07 – 2016-12-08 (×2): 21 mg via TRANSDERMAL
  Filled 2016-12-07 (×2): qty 1

## 2016-12-07 MED ORDER — ACETAMINOPHEN 650 MG RE SUPP: 650.0000 mg | Freq: Four times a day (QID) | RECTAL | Status: DC | PRN

## 2016-12-07 MED ORDER — PREGABALIN 100 MG PO CAPS
100.0000 mg | ORAL_CAPSULE | Freq: Every day | ORAL | Status: DC
  Administered 2016-12-07: 100 mg via ORAL
  Filled 2016-12-07: qty 1

## 2016-12-07 NOTE — Progress Notes (Signed)
CRITICAL VALUE ALERT  Critical Value:  Troponin 0.11  Date & Time Notied:  12/07/2016  0735  Provider Notified: Dr. Wynetta Emery  Orders Received/Actions taken: Awaiting return call

## 2016-12-07 NOTE — Progress Notes (Signed)
Patients family member stated that patient said: "No one has been in here to see me all day. I haven't gotten my medication all day." Patients family member was assured that patient has been seen by both the nurse Dixie Dials, RN) and nurse tech Arlie Solomons, NT) throughout the day. Patients family member stated that she understood and that her family member (patient) can be confused at times. Patient is A&O X4 per assessment. Will continue to monitor.

## 2016-12-07 NOTE — ED Notes (Signed)
Pt given sandwich and ginger ale per Dr Rex Kras

## 2016-12-07 NOTE — Progress Notes (Signed)
PROGRESS NOTE   [name]  HYW:[id number]  DOB: 11/13/1940  DOA: 12/06/2016 PCP: [place], Betty G, MD  Brief Admission Hx: [name] is a 76 y.o. female with medical history significant of ESRD on HD on MWF, peripheral neuropathy and chronic low back pain. GERD, CHF,  COPD, HLD, HTN restless legs syndrome, chronic systolic CHF who presents with HCAP.   MDM/Assessment & Plan:   HCAP - IV cefepime and vancomycin Poor IV access - will ask IR for central line placement.  Chronic diastolic CHF - compensated, stable. ESRD on HD - nephrology consulted for HD on Monday Pleuritic chest pain - likely from cough related to pneumonia. V/Q scan negative for PE.   COPD - stable  Tobacco Abuse - counseled on cessation at length GERD - stable, follow.  Elevated troponin - likely demand ischemia.    DVT prophylaxis: lovenox Code Status: full code  Family Communication: bedside 3 Disposition Plan: home with medically stable   Subjective: Pt says she is feeling a little better today.    Objective: Vitals:   12/07/16 0051 12/07/16 0127 12/07/16 0457 12/07/16 1000  BP: (!) 183/54 (!) 188/46 (!) 196/52 (!) 141/60  Pulse: 68 63 60 68  Resp:  18 18   Temp:  98.8 F (37.1 C) 98.4 F (36.9 C) 99 F (37.2 C)  TempSrc:  Oral Oral Oral  SpO2: 100% 97% 94% 96%  Weight:  80.6 kg (177 lb 9.6 oz)    Height:  5\' 1"  (1.549 m)      Intake/Output Summary (Last 24 hours) at 12/07/16 1326 Last data filed at 12/07/16 0700  Gross per 24 hour  Intake               50 ml  Output                0 ml  Net               50 ml   Filed Weights   12/06/16 1625 12/07/16 0127  Weight: 81.7 kg (180 lb 1.9 oz) 80.6 kg (177 lb 9.6 oz)   REVIEW OF SYSTEMS  As per history otherwise all reviewed and reported negative  Exam:  General exam: awake, alert, NAD.   Respiratory system:  No increased work of breathing. Cardiovascular system: S1 & S2 heard.   Gastrointestinal system: Abdomen is nondistended,  soft and nontender. Normal bowel sounds heard. Central nervous system: Alert and oriented. No focal neurological deficits. Extremities: no cyanosis.    Data Reviewed: Basic Metabolic Panel:  Recent Labs Lab 12/06/16 1620 12/07/16 0444  NA 136 136  K 3.1* 4.0  CL 96* 98*  CO2 29 28  GLUCOSE 73 71  BUN 8 16  CREATININE 4.19* 5.73*  CALCIUM 8.8* 8.8*  MG  --  2.0  PHOS  --  3.6   Liver Function Tests:  Recent Labs Lab 12/07/16 0444  AST 13*  ALT 10*  ALKPHOS 68  BILITOT 0.8  PROT 7.2  ALBUMIN 2.8*   No results for input(s): LIPASE, AMYLASE in the last 168 hours. No results for input(s): AMMONIA in the last 168 hours. CBC:  Recent Labs Lab 12/06/16 1620 12/07/16 0444  WBC 10.5 6.9  HGB 12.4 11.2*  HCT 38.5 35.6*  MCV 90.0 90.1  PLT 207 183   Cardiac Enzymes:  Recent Labs Lab 12/07/16 0444  TROPONINI 0.11*   CBG (last 3)  No results for input(s): GLUCAP in the last 72  hours. Recent Results (from the past 240 hour(s))  MRSA PCR Screening     Status: None   Collection Time: 12/07/16  4:32 AM  Result Value Ref Range Status   MRSA by PCR NEGATIVE NEGATIVE Final    Comment:        The GeneXpert MRSA Assay (FDA approved for NASAL specimens only), is one component of a comprehensive MRSA colonization surveillance program. It is not intended to diagnose MRSA infection nor to guide or monitor treatment for MRSA infections.     Studies: Dg Chest 2 View  Result Date: 12/06/2016 CLINICAL DATA:  Chest pint and tightness. Shortness of breath for 2 days. EXAM: CHEST  2 VIEW COMPARISON:  04/19/2016 FINDINGS: Low lung volumes with asymmetric elevation of the right hemidiaphragm as before. There is basilar atelectasis with somewhat confluent opacity in the medial right base raising the question of pneumonia. No pleural effusion. The cardiopericardial silhouette is within normal limits for size. He Ro dialysis graft again noted. The visualized bony structures of  the thorax are intact. Radiodense material projecting over the upper abdomen may be related to contrast material within colonic diverticuli. IMPRESSION: Low volume film with bibasilar atelectasis and question of superimposed pneumonia at the right base. Electronically Signed   By: Misty Stanley M.D.   On: 12/06/2016 17:07   Nm Pulmonary Perf And Vent  Result Date: 12/07/2016 CLINICAL DATA:  Acute onset of diaphragmatic pain and shortness of breath. Initial encounter. EXAM: NUCLEAR MEDICINE VENTILATION - PERFUSION LUNG SCAN TECHNIQUE: Ventilation images were obtained in multiple projections using inhaled aerosol Tc-9m DTPA. Perfusion images were obtained in multiple projections after intravenous injection of Tc-37m MAA. RADIOPHARMACEUTICALS:  32.8 mCi Technetium-48m DTPA aerosol inhalation and 4.05 mCi Technetium-13m MAA IV COMPARISON:  Chest radiograph performed earlier today at 4:55 p.m. FINDINGS: Ventilation: There is mildly heterogeneous ventilation within both lungs, with vague ventilation defects noted along the periphery of both lungs. No well defined segmental ventilation defects are identified. Perfusion: No wedge shaped peripheral perfusion defects to suggest acute pulmonary embolism. IMPRESSION: Low probability for pulmonary embolus. Mildly heterogeneous ventilation within both lungs. Electronically Signed   By: Garald Balding M.D.   On: 12/07/2016 00:00   Scheduled Meds: . allopurinol  100 mg Oral Q T,Th,S,Su  . amLODipine  10 mg Oral QHS  . aspirin EC  81 mg Oral Daily  . atorvastatin  40 mg Oral QHS  . cinacalcet  60 mg Oral Q supper  . cloNIDine  0.2 mg Oral BID  . [START ON 12/08/2016] doxercalciferol  4 mcg Intravenous Q M,W,F-HD  . DULoxetine  60 mg Oral QHS  . enoxaparin (LOVENOX) injection  30 mg Subcutaneous Q24H  . feeding supplement (NEPRO CARB STEADY)  237 mL Oral BID BM  . guaiFENesin  600 mg Oral BID  . hydrALAZINE  10 mg Oral BID  . losartan  100 mg Oral Daily  .  metoprolol succinate  150 mg Oral QHS  . multivitamin  1 tablet Oral QHS  . nicotine  21 mg Transdermal Daily  . pantoprazole  40 mg Oral Daily  . pregabalin  100 mg Oral QHS  . sevelamer carbonate  2,400 mg Oral TID WC  . sodium chloride flush  3 mL Intravenous Q12H  . sodium chloride flush  3 mL Intravenous Q12H   Continuous Infusions: . sodium chloride    . [START ON 12/08/2016] ceFEPime (MAXIPIME) IV    . [START ON 12/08/2016] vancomycin  Active Problems:   HYPERCHOLESTEROLEMIA   TOBACCO ABUSE   GASTROESOPHAGEAL REFLUX, NO ESOPHAGITIS   Dysphagia   CHF (congestive heart failure) (HCC)   End stage renal disease on dialysis (HCC)   ESRD on dialysis (HCC)   Chronic obstructive pulmonary disease (HCC)   End stage renal disease (HCC)   Nonspecific chest pain   Elevated troponin   HCAP (healthcare-associated pneumonia)   Hypokalemia   Chronic systolic CHF (congestive heart failure) ([place])  Time spent:   Irwin Brakeman, MD, FAAFP [facility] Pager [phone]  If 7PM-7AM, please contact night-coverage [email or web address] Password [email or web address] 12/07/2016, 1:26 PM    LOS: 0 days

## 2016-12-07 NOTE — Progress Notes (Addendum)
To [phone] via stretcher. No family with patient. Attempted most of admission. Patient kept falling asleep. Assessment as charted.

## 2016-12-07 NOTE — Progress Notes (Signed)
Pharmacy Antibiotic Note  [name] is a 76 y.o. female admitted on 12/06/2016 with pneumonia.  Pharmacy has been consulted for Vancomycin dosing. Shortness of breath since Thursday. WBC WNL. ESRD on HD MWF. CXR with ?superimposed PNA.   Plan: Vancomycin 1500 mg IV x 1, then given 750 mg IV qHD MWF Cefepime 2g IV x 1, then give 2g IV q1800 MWF (after HD) Trend WBC, temp, renal function  F/U infectious work-up Drug levels as indicated   Height: 5\' 1"  (154.9 cm) Weight: 177 lb 9.6 oz (80.6 kg) IBW/kg (Calculated) : 47.8  Temp (24hrs), Avg:98.7 F (37.1 C), Min:98.5 F (36.9 C), Max:98.8 F (37.1 C)   Recent Labs Lab 12/06/16 1620  WBC 10.5  CREATININE 4.19*    Estimated Creatinine Clearance: 11.2 mL/min (A) (by C-G formula based on SCr of 4.19 mg/dL (H)).    Allergies  Allergen Reactions  . Tuberculin Tests Hives    "blisters"  . Valacyclovir Other (See Comments)    Confusion and nervousness  . Codeine Nausea And Vomiting  . Penicillins Rash    No problems breathing. Has tolerated omnicef in past without issue Has patient had a PCN reaction causing immediate rash, facial/tongue/throat swelling, SOB or lightheadedness with hypotension: Yes Has patient had a PCN reaction causing severe rash involving mucus membranes or skin necrosis: No Has patient had a PCN reaction that required hospitalization No Has patient had a PCN reaction occurring within the last 10 years: No If all of the above answers are "NO", then may proceed with Cephalosporin  . Sulfamethoxazole Rash    Narda Bonds 12/07/2016 2:34 AM

## 2016-12-07 NOTE — Consult Note (Signed)
[name] Renal Consultation Note    Indication for Consultation:  Management of ESRD/hemodialysis; anemia, hypertension/volume and secondary hyperparathyroidism  Assessment/Plan: 1. ?HCAP - CXR with bibasilar atelectasis and question of superimposed pneumonia at the right base. Empiric Vanc/cefepine started per primary. Of note pt denies any cough or fevers. But dyspnea is improved since hospitalization.  2. Atypical chest pain - Pain with deep inspiration -?sec #1. VQ scan with low probability for PE.  3. Elevated troponin 0.9 >0.11 trending  per primary  4.  ESRD -  MWF - last HD Sat too weak to attend Friday - No urgent HD needs today. Continue on schedule next HD Monday. K 4.0. Has pending appt for shuntogram at [facility] with low AF, poor clearance.  5.  Hypertension/volume  - Elevated on admission now controlled. On 4 BP meds/ No gross volume on exam 6.  Anemia  - Hgb 11.2. No ESA needs currently  7.  Metabolic bone disease -  Ca/P ok Cont Hectorol/Renvela binder/Sensipar  8.  Nutrition - Renal diet/vitamins. Add nepro for low albumin  HPI: [name] is a 76 y.o. 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 PMH also significant for hypertension, COPD, CHF, GERD, IBS, neuropathy, seizure disorder.   She presented to [facility] ED yesterday with worsening "rib pain", SOB and weakness that began 3 days prior. She describes sharp pain below her left rib that woke her from her sleep on Thursday night. Felt too weak to attend scheduled dialysis on Friday, so rescheduled for Saturday and was able to complete full session. She continued to feel weak with sharp pain on deep inspiration, so family brought her to ED. VQ scan with low probability for PE. CXR with bibasilar atelectasis and question of superimposed pneumonia at the right base. Empiric Vanc/cefepine started. Labs significant for elevated troponin 0.9>0.11.   Seen in room with husband present. Lying flat in bed. She  reports having productive cough for last 1-2 weeks and treating with inhalers. Cough seemed to improve over last few days. Sharp chest pain only when takes a deep breath. Denies cough, chest pain at present, SOB, N,V, D, or abdominal pain.  Dialyzes at [facility] MWF. Last HD was yesterday 7/7. Completed full treatment and left at 81.7 kg with EDW 81.5kg. Pre and post HD BP readings tend to run high. Endorses compliance with meds. Was referred to [facility] for fistulogram d/t low AF. Has pending appointment this week.   Past Medical History:  Diagnosis Date  . Abnormality of gait 03/28/2015  . Adrenal mass ([place])   . 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 Arthritis   . Back pain   . CHF (congestive heart failure) ([place])   . Chronic kidney disease    Hemo MWF  . Congestion of throat    Pt states she has a lot mucus in back throat.  . Constipation   . COPD (chronic obstructive pulmonary disease) ([place])   . Depression   . Dialysis patient [facility])    kidney  . Diverticulitis   . GERD (gastroesophageal reflux disease)   . H/O hiatal hernia   . High cholesterol   . Hoarseness of voice   . Hyperlipidemia   . Hypertension   . IBS (irritable bowel syndrome)   . Meralgia paresthetica of right side 12/26/2014  . Normal cardiac stress test 12/24/2009   lexiscan, imaging normal  . Renal disorder   .  RLS (restless legs syndrome)   . Seizures ([place])    2004 past brain surgery  . Sinus complaint   . Thyroid disease   . TIA (transient ischemic attack)   . Tubular adenoma of colon 01/2008   Past Surgical History:  Procedure Laterality Date  . ABDOMINAL HYSTERECTOMY    . ANAL RECTAL MANOMETRY N/A 07/25/2015   Procedure: ANO RECTAL MANOMETRY;  Surgeon: Mauri Pole, MD;  Location: [facility];  Service: Endoscopy;  Laterality: N/A;  . APPENDECTOMY    . AV FISTULA PLACEMENT Left 11/11/2012   Procedure: INSERTION OF ARTERIOVENOUS (AV) GORE-TEX GRAFT ARM;  Surgeon:  Angelia Mould, MD;  Location: [place];  Service: Vascular;  Laterality: Left;  . [place] REMOVAL Left 11/18/2012   Procedure: REMOVAL OF LEFT UPPER ARM ARTERIOVENOUS GORETEX GRAFT ([place]);  Surgeon: Angelia Mould, MD;  Location: [place];  Service: Vascular;  Laterality: Left;  . CARDIAC CATHETERIZATION  2003   normal  . CHOLECYSTECTOMY     Open mid line incision  . frontal craniotomy  2002   indication = sinusitis  . INSERTION OF DIALYSIS CATHETER     Left  . PATCH ANGIOPLASTY Left 11/18/2012   Procedure: PATCH ANGIOPLASTY;  Surgeon: Angelia Mould, MD;  Location: [place];  Service: Vascular;  Laterality: Left;  . PERIPHERAL VASCULAR CATHETERIZATION Left 03/08/2015   Procedure: A/V Shuntogram/Fistulagram;  Surgeon: Algernon Huxley, MD;  Location: [place] CV LAB;  Service: Cardiovascular;  Laterality: Left;  . PERIPHERAL VASCULAR CATHETERIZATION Left 03/08/2015   Procedure: A/V Shunt Intervention;  Surgeon: Algernon Huxley, MD;  Location: [place] CV LAB;  Service: Cardiovascular;  Laterality: Left;  . RECTAL ULTRASOUND N/A 10/05/2015   Procedure: ANAL ULTRASOUND WITH PROBE;  Surgeon: Leighton Ruff, MD;  Location: [facility];  Service: Endoscopy;  Laterality: N/A;  . REVISON OF ARTERIOVENOUS FISTULA  05/11/2012   Procedure: REVISON OF ARTERIOVENOUS FISTULA;  Surgeon: Elam Dutch, MD;  Location: [place];  Service: Vascular;  Laterality: Right;  . TUBAL LIGATION     Family History  Problem Relation Age of Onset  . Thyroid cancer Mother   . Heart disease Father   . Hypertension Father   . Heart attack Father   . Lupus Daughter   . Breast cancer Sister   . Thyroid cancer Sister   . Kidney disease Sister   . Colon cancer Neg Hx    Social History:  reports that she has been smoking Cigarettes.  She has a 60.00 pack-year smoking history. She has never used smokeless tobacco. She reports that she does not drink alcohol or use drugs. Allergies  Allergen Reactions  .  Tuberculin Tests Hives    "blisters"  . Valacyclovir Other (See Comments)    Confusion and nervousness  . Codeine Nausea And Vomiting  . Penicillins Rash    No problems breathing. Has tolerated omnicef in past without issue Has patient had a PCN reaction causing immediate rash, facial/tongue/throat swelling, SOB or lightheadedness with hypotension: Yes Has patient had a PCN reaction causing severe rash involving mucus membranes or skin necrosis: No Has patient had a PCN reaction that required hospitalization No Has patient had a PCN reaction occurring within the last 10 years: No If all of the above answers are "NO", then may proceed with Cephalosporin  . Sulfamethoxazole Rash   Prior to Admission medications   Medication Sig Start Date End Date Taking? Authorizing Provider  acetaminophen (TYLENOL) 500 MG tablet Take 1,000 mg  by mouth every 6 (six) hours as needed for mild pain or moderate pain.   Yes [provider]  albuterol (PROVENTIL HFA;VENTOLIN HFA) 108 (90 Base) MCG/ACT inhaler Inhale 2 puffs into the lungs every 4 (four) hours as needed for wheezing or shortness of breath. 06/05/15  Yes Gottschalk, Leatrice Jewels M, DO  allopurinol (ZYLOPRIM) 100 MG tablet Take 100 mg by mouth once daily on non-dialysis days (Tues/Thurs/Sat/Sun) Patient taking differently: Take 100 mg by mouth See admin instructions. Take one tablet (100 mg) by mouth once daily on non-dialysis days (Tues/Thurs/Sat/Sun) 11/13/15  Yes Smiley Houseman, MD  amLODipine (NORVASC) 10 MG tablet Take 1 tablet (10 mg total) by mouth at bedtime. 11/12/16  Yes Martinique, Betty G, MD  aspirin EC 81 MG tablet Take 81 mg by mouth daily.    Yes [provider]  atorvastatin (LIPITOR) 40 MG tablet Take 1 tablet (40 mg total) by mouth daily. Patient taking differently: Take 40 mg by mouth at bedtime.  02/26/15  Yes Rumley, Gotham N, DO  azelastine (ASTELIN) 0.1 % nasal spray Place 1 spray into both nostrils 2 (two) times daily.  Use in each nostril as directed Patient taking differently: Place 1 spray into both nostrils 2 (two) times daily as needed for rhinitis or allergies. Use in each nostril as directed 08/07/16  Yes Martinique, Betty G, MD  cinacalcet (SENSIPAR) 90 MG tablet Take 90 mg by mouth at bedtime.   Yes [provider]  cloNIDine (CATAPRES) 0.2 MG tablet Take 1 tablet (0.2 mg total) by mouth 3 (three) times daily. Patient taking differently: Take 0.2 mg by mouth 2 (two) times daily.  11/12/16  Yes [place]ique, Betty G, MD  DULoxetine (CYMBALTA) 30 MG capsule Take 2 capsules (60 mg total) by mouth daily. Patient taking differently: Take 60 mg by mouth at bedtime.  11/13/16  Yes Ward Givens, NP  fluticasone (FLONASE) 50 MCG/ACT nasal spray Place 2 sprays into both nostrils daily as needed for allergies or rhinitis. 05/08/16  Yes Rogue Bussing, MD  Homeopathic Products (LEG CRAMP RELIEF PO) Take 1-2 tablets by mouth daily as needed (for leg pain).    Yes [provider]  hydrALAZINE (APRESOLINE) 10 MG tablet Take 1 tablet (10 mg total) by mouth 2 (two) times daily. 11/12/16  Yes [place], Betty G, MD  ipratropium (ATROVENT HFA) 17 MCG/ACT inhaler Inhale 2 puffs into the lungs every 4 (four) hours as needed for wheezing. 06/05/15  Yes Ronnie Doss M, DO  lidocaine-prilocaine (EMLA) cream Apply 1 application topically See admin instructions. Apply topically one hour before dialysis - Monday, Wednesday, Friday   Yes [provider]  loratadine (CLARITIN) 10 MG tablet Take 1 tablet (10 mg total) by mouth daily as needed for allergies. 08/09/16  Yes [place]ique, Betty G, MD  losartan (COZAAR) 100 MG tablet Take 1 tablet (100 mg total) by mouth daily. 11/12/16  Yes [place]ique, Betty G, MD  metoprolol succinate (TOPROL-XL) 100 MG 24 hr tablet Take 150 mg by mouth at bedtime. 12/06/16  Yes [provider]  multivitamin (RENA-VIT) TABS tablet Take 1 tablet by mouth at bedtime. Patient taking  differently: Take 1 tablet by mouth daily.  01/01/15  Yes Haney, Alyssa A, MD  omeprazole (PRILOSEC) 40 MG capsule Take 1 capsule (40 mg total) by mouth 2 (two) times daily. 12/27/15  Yes Ladene Artist, MD  pregabalin (LYRICA) 100 MG capsule Take 100 mg by mouth at bedtime.    Yes [provider]  sevelamer carbonate (RENVELA) 800 MG tablet Take 2,400 mg by mouth See admin instructions. Take 3 tablets (2400 mg) by mouth with each meal - 4 times daily   Yes [provider]  Aluminum & Magnesium Hydroxide 600-300 MG/5ML CONC Take 5 mLs by mouth every 6 (six) hours as needed for indigestion. Patient not taking: Reported on 12/06/2016 11/02/15   Tonette Bihari, MD  metoprolol succinate (TOPROL-XL) 50 MG 24 hr tablet Take 1 tablet (50 mg total) by mouth at bedtime. Patient not taking: Reported on 12/06/2016 11/12/16   [place], Betty G, MD   Current Facility-Administered Medications  Medication Dose Route Frequency Provider Last Rate Last Dose  . 0.9 %  sodium chloride infusion  250 mL Intravenous PRN Toy Baker, MD      . acetaminophen (TYLENOL) tablet 650 mg  650 mg Oral Q6H PRN Doutova, Anastassia, MD       Or  . acetaminophen (TYLENOL) suppository 650 mg  650 mg Rectal Q6H PRN Doutova, Anastassia, MD      . albuterol (PROVENTIL) (2.5 MG/3ML) 0.083% nebulizer solution 2.5 mg  2.5 mg Nebulization Q2H PRN Doutova, Anastassia, MD      . allopurinol (ZYLOPRIM) tablet 100 mg  100 mg Oral Q T,Th,S,Su Doutova, Anastassia, MD   100 mg at 12/07/16 0953  . amLODipine (NORVASC) tablet 10 mg  10 mg Oral QHS Doutova, Anastassia, MD      . aspirin EC tablet 81 mg  81 mg Oral Daily Doutova, Anastassia, MD   81 mg at 12/07/16 0950  . atorvastatin (LIPITOR) tablet 40 mg  40 mg Oral QHS Toy Baker, MD      . Derrill Memo ON 12/08/2016] ceFEPIme (MAXIPIME) 2 g in dextrose 5 % 50 mL IVPB  2 g Intravenous Q M,W,F-1800 Erenest Blank, RPH      . cinacalcet (SENSIPAR) tablet 90 mg  90 mg Oral  Q supper Doutova, Anastassia, MD      . cloNIDine (CATAPRES) tablet 0.2 mg  0.2 mg Oral BID Doutova, Anastassia, MD   0.2 mg at 12/07/16 0952  . DULoxetine (CYMBALTA) DR capsule 60 mg  60 mg Oral QHS Doutova, Anastassia, MD      . enoxaparin (LOVENOX) injection 30 mg  30 mg Subcutaneous Q24H Doutova, Anastassia, MD   30 mg at 12/07/16 0953  . guaiFENesin (MUCINEX) 12 hr tablet 600 mg  600 mg Oral BID Toy Baker, MD   600 mg at 12/07/16 1000  . hydrALAZINE (APRESOLINE) tablet 10 mg  10 mg Oral BID Toy Baker, MD   10 mg at 12/07/16 0950  . HYDROcodone-acetaminophen (NORCO/VICODIN) 5-325 MG per tablet 1-2 tablet  1-2 tablet Oral Q4H PRN Doutova, Anastassia, MD      . ipratropium (ATROVENT) nebulizer solution 0.5 mg  0.5 mg Nebulization Q6H PRN Doutova, Anastassia, MD      . losartan (COZAAR) tablet 100 mg  100 mg Oral Daily Doutova, Anastassia, MD   100 mg at 12/07/16 0951  . metoprolol succinate (TOPROL-XL) 24 hr tablet 150 mg  150 mg Oral QHS Doutova, Anastassia, MD      . nicotine (NICODERM CQ - dosed in mg/24 hours) patch 21 mg  21 mg Transdermal Daily Doutova, Anastassia, MD   21 mg at 12/07/16 0949  . ondansetron (ZOFRAN) tablet 4 mg  4 mg Oral Q6H PRN Doutova, Anastassia, MD       Or  . ondansetron (ZOFRAN) injection 4 mg  4 mg Intravenous Q6H PRN Doutova, Anastassia,  MD      . pantoprazole (PROTONIX) EC tablet 40 mg  40 mg Oral Daily Doutova, Anastassia, MD   40 mg at 12/07/16 0951  . pregabalin (LYRICA) capsule 100 mg  100 mg Oral QHS Doutova, Anastassia, MD      . sevelamer carbonate (RENVELA) tablet 2,400 mg  2,400 mg Oral TID WC Doutova, Anastassia, MD   2,400 mg at 12/07/16 0832  . sodium chloride flush (NS) 0.9 % injection 3 mL  3 mL Intravenous Q12H Toy Baker, MD   3 mL at 12/07/16 6295  . sodium chloride flush (NS) 0.9 % injection 3 mL  3 mL Intravenous Q12H Doutova, Anastassia, MD   3 mL at 12/07/16 0424  . sodium chloride flush (NS) 0.9 % injection 3 mL   3 mL Intravenous PRN Toy Baker, MD      . Derrill Memo ON 12/08/2016] vancomycin (VANCOCIN) IVPB 750 mg/150 ml premix  750 mg Intravenous Q M,W,F-HD Ledford, James L, RPH        ROS: As per HPI otherwise negative.  Physical Exam: Vitals:   12/07/16 0045 12/07/16 0051 12/07/16 0127 12/07/16 0457  BP:  (!) 183/54 (!) 188/46 (!) 196/52  Pulse: 61 68 63 60  Resp:   18 18  Temp:   98.8 F (37.1 C) 98.4 F (36.9 C)  TempSrc:   Oral Oral  SpO2: 100% 100% 97% 94%  Weight:   80.6 kg (177 lb 9.6 oz)   Height:   5\' 1"  (1.549 m)      General: WDWN older AAF lying flat in bed NAD Head: NCAT sclera not icteric MMM Neck: Supple. No JVD No masses Lungs: Diminished at bases. Breathing is unlabored. Heart: RRR with S1 S2 Abdomen: soft NT + BS Lower extremities:without edema or ischemic changes, no open wounds  Neuro: A & O  X 3. Moves all extremities spontaneously. Psych:  Responds to questions appropriately with a normal affect. Dialysis Access: LUE AVG-HeRO +bruit   Labs: Basic Metabolic Panel:  Recent Labs Lab 12/06/16 1620 12/07/16 0444  NA 136 136  K 3.1* 4.0  CL 96* 98*  CO2 29 28  GLUCOSE 73 71  BUN 8 16  CREATININE 4.19* 5.73*  CALCIUM 8.8* 8.8*  PHOS  --  3.6   Liver Function Tests:  Recent Labs Lab 12/07/16 0444  AST 13*  ALT 10*  ALKPHOS 68  BILITOT 0.8  PROT 7.2  ALBUMIN 2.8*   No results for input(s): LIPASE, AMYLASE in the last 168 hours. No results for input(s): AMMONIA in the last 168 hours. CBC:  Recent Labs Lab 12/06/16 1620 12/07/16 0444  WBC 10.5 6.9  HGB 12.4 11.2*  HCT 38.5 35.6*  MCV 90.0 90.1  PLT 207 183   Cardiac Enzymes:  Recent Labs Lab 12/07/16 0444  TROPONINI 0.11*   CBG: No results for input(s): GLUCAP in the last 168 hours. Iron Studies: No results for input(s): IRON, TIBC, TRANSFERRIN, FERRITIN in the last 72 hours. Studies/Results: Dg Chest 2 View  Result Date: 12/06/2016 CLINICAL DATA:  Chest pint and tightness.  Shortness of breath for 2 days. EXAM: CHEST  2 VIEW COMPARISON:  04/19/2016 FINDINGS: Low lung volumes with asymmetric elevation of the right hemidiaphragm as before. There is basilar atelectasis with somewhat confluent opacity in the medial right base raising the question of pneumonia. No pleural effusion. The cardiopericardial silhouette is within normal limits for size. He Ro dialysis graft again noted. The visualized bony structures of the thorax  are intact. Radiodense material projecting over the upper abdomen may be related to contrast material within colonic diverticuli. IMPRESSION: Low volume film with bibasilar atelectasis and question of superimposed pneumonia at the right base. Electronically Signed   By: Misty Stanley M.D.   On: 12/06/2016 17:07   Nm Pulmonary Perf And Vent  Result Date: 12/07/2016 CLINICAL DATA:  Acute onset of diaphragmatic pain and shortness of breath. Initial encounter. EXAM: NUCLEAR MEDICINE VENTILATION - PERFUSION LUNG SCAN TECHNIQUE: Ventilation images were obtained in multiple projections using inhaled aerosol Tc-20m DTPA. Perfusion images were obtained in multiple projections after intravenous injection of Tc-92m MAA. RADIOPHARMACEUTICALS:  32.8 mCi Technetium-80m DTPA aerosol inhalation and 4.05 mCi Technetium-51m MAA IV COMPARISON:  Chest radiograph performed earlier today at 4:55 p.m. FINDINGS: Ventilation: There is mildly heterogeneous ventilation within both lungs, with vague ventilation defects noted along the periphery of both lungs. No well defined segmental ventilation defects are identified. Perfusion: No wedge shaped peripheral perfusion defects to suggest acute pulmonary embolism. IMPRESSION: Low probability for pulmonary embolus. Mildly heterogeneous ventilation within both lungs. Electronically Signed   By: Garald Balding M.D.   On: 12/07/2016 00:00    Dialysis Orders:  [place] MWF  3.75h  180F BFR 400/A1.5x 2K/2.25 Ca UF Profile 2 EDW 81.5kg  L AVG HeRO  Heparin Bolus 4000 U -Hectorol 4 mcg IV q HD -Mircera 87mcg IV q 4 weeks (last dose 6/20)   Ogechi Larina Earthly PA-C [place] Pager [phone] 12/07/2016, 11:31 AM    Agree with PA A&P. Very pleasant pt; she's usually always joking. She's more concerned about slightly losing her voice. She would eventually like to be seen by a ENT to evaluate the hoarseness/ vocal cords. No acute indication for HD tonight. Next HD in the AM.

## 2016-12-08 ENCOUNTER — Inpatient Hospital Stay (HOSPITAL_COMMUNITY): Payer: Medicare Other

## 2016-12-08 DIAGNOSIS — I5032 Chronic diastolic (congestive) heart failure: Secondary | ICD-10-CM

## 2016-12-08 LAB — RENAL FUNCTION PANEL
Albumin: 2.7 g/dL — ABNORMAL LOW (ref 3.5–5.0)
Anion gap: 11 (ref 5–15)
BUN: 32 mg/dL — AB (ref 6–20)
CALCIUM: 8.9 mg/dL (ref 8.9–10.3)
CO2: 27 mmol/L (ref 22–32)
CREATININE: 7.84 mg/dL — AB (ref 0.44–1.00)
Chloride: 99 mmol/L — ABNORMAL LOW (ref 101–111)
GFR calc non Af Amer: 4 mL/min — ABNORMAL LOW (ref >60)
GFR, EST AFRICAN AMERICAN: 5 mL/min — AB (ref >60)
GLUCOSE: 81 mg/dL (ref 65–99)
Phosphorus: 5 mg/dL — ABNORMAL HIGH (ref 2.5–4.6)
Potassium: 3.9 mmol/L (ref 3.5–5.1)
SODIUM: 137 mmol/L (ref 135–145)

## 2016-12-08 LAB — CBC
HCT: 33.3 % — ABNORMAL LOW (ref 36.0–46.0)
Hemoglobin: 10.6 g/dL — ABNORMAL LOW (ref 12.0–15.0)
MCH: 28.6 pg (ref 26.0–34.0)
MCHC: 31.8 g/dL (ref 30.0–36.0)
MCV: 89.8 fL (ref 78.0–100.0)
PLATELETS: 160 10*3/uL (ref 150–400)
RBC: 3.71 MIL/uL — AB (ref 3.87–5.11)
RDW: 16.2 % — AB (ref 11.5–15.5)
WBC: 8.6 10*3/uL (ref 4.0–10.5)

## 2016-12-08 LAB — HEMOGLOBIN A1C
Hgb A1c MFr Bld: 5.1 % (ref 4.8–5.6)
Mean Plasma Glucose: 100 mg/dL

## 2016-12-08 MED ORDER — OXYCODONE-ACETAMINOPHEN 5-325 MG PO TABS
2.0000 | ORAL_TABLET | Freq: Once | ORAL | Status: AC
  Administered 2016-12-08: 2 via ORAL

## 2016-12-08 MED ORDER — ACETAMINOPHEN 325 MG PO TABS
ORAL_TABLET | ORAL | Status: AC
  Filled 2016-12-08: qty 2

## 2016-12-08 MED ORDER — VANCOMYCIN HCL IN DEXTROSE 750-5 MG/150ML-% IV SOLN
INTRAVENOUS | Status: AC
  Filled 2016-12-08: qty 150

## 2016-12-08 MED ORDER — DOXYCYCLINE HYCLATE 100 MG PO TABS
100.0000 mg | ORAL_TABLET | Freq: Two times a day (BID) | ORAL | Status: DC
  Administered 2016-12-08: 100 mg via ORAL
  Filled 2016-12-08: qty 1

## 2016-12-08 MED ORDER — DOXERCALCIFEROL 4 MCG/2ML IV SOLN
INTRAVENOUS | Status: AC
  Filled 2016-12-08: qty 2

## 2016-12-08 MED ORDER — OXYCODONE-ACETAMINOPHEN 5-325 MG PO TABS
ORAL_TABLET | ORAL | Status: AC
  Filled 2016-12-08: qty 2

## 2016-12-08 MED ORDER — DOXYCYCLINE HYCLATE 100 MG PO TABS: 100.0000 mg | ORAL_TABLET | Freq: Two times a day (BID) | ORAL | 0 refills | Status: AC

## 2016-12-08 MED ORDER — CLONIDINE HCL 0.2 MG PO TABS: 0.2000 mg | ORAL_TABLET | Freq: Two times a day (BID) | ORAL | Status: DC

## 2016-12-08 NOTE — Progress Notes (Signed)
Patient discharged to home with family. Telemetry removed. All discharge instructions reviewed. All belongings with the patient. Patient left unit in stable condition.  Sheliah Plane RN

## 2016-12-08 NOTE — Discharge Summary (Signed)
Physician Discharge Summary  Brianna MONTERROSO AOZ:[id number] DOB: 04/13/1941 DOA: 12/06/2016  PCP: Martinique, Betty G, MD Nephrologist: Dr. Marval Regal  Admit date: 12/06/2016 Discharge date: 12/08/2016  Admitted From: HOME Disposition: HOME   Recommendations for Outpatient Follow-up:  1. Follow up with PCP in 1 weeks 2. Resume regular hemodialysis schedule  Discharge Condition: STABLE   CODE STATUS: FULL    Brief Hospitalization Summary: Please see all hospital notes, images, labs for full details of the hospitalization.  [name] is a 76 y.o. female with medical history significant of ESRD on HD on MWF, peripheral neuropathy and chronic low back pain. GERD, CHF,  COPD, HLD, HTN restless legs syndrome, chronic systolic CHF   Presented with chest pain across subdiaphragmatic and for the past 3 days after she has been coughing for the past few weeks. Associated shortness of breath patient usually on dialysis Monday Wednesday Friday but felt so bad that she have skipped her dialysis yesterday and went in today when she had a complete treatment without any problems. Describes chest pain as feels like the lungs are hurting. Its pleuritic in nature. She have had some dyspnea with exertion usually able to walk several problems. She's been having some wheezing and using her inhalers report some cough for past 2-3 weeks initially was productive of thick sputum now less productive but still very thick mucus. Denies any fever no travel no leg edema she does have history of Tubular adenoma of the colon that was remote no history of blood clots. No fever. No hemoptysis, No sick contacts  HCAP - IV cefepime and vancomycin and rapidly de-escalated to oral doxycycline to take for 5 more days, symptoms resolved now, no cough, no fever or SOB.  Chronic diastolic CHF - compensated, stable. ESRD on HD - nephrology consulted for HD on Monday Pleuritic chest pain - likely from cough related to pneumonia. V/Q scan  negative for PE.   COPD - stable  Tobacco Abuse - counseled on cessation at length and provided nicotine patch and written information for help with quitting.   GERD - stable, follow.  Elevated troponin - likely demand ischemia.    From nephrology team 1. ?HCAP - CXR with bibasilar atelectasis and question of superimposed pneumonia at the right base. Empiric Vanc/cefepine started per primary. Of note pt denies any cough or fevers. But dyspnea is improved since hospitalization. 2. Atypical chest pain - Pain with deep inspiration -?sec #1. VQ scan with low probability for PE.  3. Elevated troponin 0.9 >0.11 trending per primary  4. ESRD - MWF - K 3.9 . Has pending appt for shuntogram at [facility] with low AF, poor clearance.  5. Hypertension/volume - Elevated on admission now controlled. On 4 BP meds/ No gross volume on exam- likely needs EDW lowered for better BP control goal today 2.5 L 6. Anemia - Hgb 11.2 down to 10.6 . Evaluate for possible redose if here Wed as she will be due her q 4 wk dose 7. Metabolic bone disease - Ca/P ok Cont Hectorol/Renvela binder/Sensipar  8. Nutrition - Renal diet/vitamins/nepro for low albumin Tobacco abuse - ongoing challenge for her - ^ risk of pulmonary infections  DVT prophylaxis: lovenox Code Status: full code  Family Communication: bedside Disposition Plan: home with medically stable   Discharge Diagnoses:  Active Problems:   HYPERCHOLESTEROLEMIA   TOBACCO ABUSE   GASTROESOPHAGEAL REFLUX, NO ESOPHAGITIS   Dysphagia   CHF (congestive heart failure) (HCC)   End stage renal disease on  dialysis [facility])   ESRD on dialysis [facility])   Chronic obstructive pulmonary disease (HCC)   End stage renal disease (HCC)   Nonspecific chest pain   Elevated troponin   HCAP (healthcare-associated pneumonia)   Hypokalemia   Chronic systolic CHF (congestive heart failure) [facility])  Discharge Instructions: Discharge Instructions    Increase activity slowly    Complete  by:  As directed      Allergies as of 12/08/2016      Reactions   Tuberculin Tests Hives   "blisters"   Valacyclovir Other (See Comments)   Confusion and nervousness   Codeine Nausea And Vomiting   Penicillins Rash   No problems breathing. Has tolerated omnicef in past without issue Has patient had a PCN reaction causing immediate rash, facial/tongue/throat swelling, SOB or lightheadedness with hypotension: Yes Has patient had a PCN reaction causing severe rash involving mucus membranes or skin necrosis: No Has patient had a PCN reaction that required hospitalization No Has patient had a PCN reaction occurring within the last 10 years: No If all of the above answers are "NO", then may proceed with Cephalosporin   Sulfamethoxazole Rash      Medication List    STOP taking these medications   Aluminum & Magnesium Hydroxide 600-300 MG/5ML Conc     TAKE these medications   acetaminophen 500 MG tablet Commonly known as:  TYLENOL Take 1,000 mg by mouth every 6 (six) hours as needed for mild pain or moderate pain.   albuterol 108 (90 Base) MCG/ACT inhaler Commonly known as:  PROVENTIL HFA;VENTOLIN HFA Inhale 2 puffs into the lungs every 4 (four) hours as needed for wheezing or shortness of breath.   allopurinol 100 MG tablet Commonly known as:  ZYLOPRIM Take 100 mg by mouth once daily on non-dialysis days (Tues/Thurs/Sat/Sun) What changed:  how much to take  how to take this  when to take this  additional instructions   amLODipine 10 MG tablet Commonly known as:  NORVASC Take 1 tablet (10 mg total) by mouth at bedtime.   aspirin EC 81 MG tablet Take 81 mg by mouth daily.   atorvastatin 40 MG tablet Commonly known as:  LIPITOR Take 1 tablet (40 mg total) by mouth daily. What changed:  when to take this   azelastine 0.1 % nasal spray Commonly known as:  ASTELIN Place 1 spray into both nostrils 2 (two) times daily. Use in each nostril as directed What  changed:  when to take this  reasons to take this  additional instructions   cinacalcet 90 MG tablet Commonly known as:  SENSIPAR Take 90 mg by mouth at bedtime.   cloNIDine 0.2 MG tablet Commonly known as:  CATAPRES Take 1 tablet (0.2 mg total) by mouth 2 (two) times daily.   doxycycline 100 MG tablet Commonly known as:  VIBRA-TABS Take 1 tablet (100 mg total) by mouth every 12 (twelve) hours.   DULoxetine 30 MG capsule Commonly known as:  CYMBALTA Take 2 capsules (60 mg total) by mouth daily. What changed:  when to take this   fluticasone 50 MCG/ACT nasal spray Commonly known as:  FLONASE Place 2 sprays into both nostrils daily as needed for allergies or rhinitis.   hydrALAZINE 10 MG tablet Commonly known as:  APRESOLINE Take 1 tablet (10 mg total) by mouth 2 (two) times daily.   ipratropium 17 MCG/ACT inhaler Commonly known as:  ATROVENT HFA Inhale 2 puffs into the lungs every 4 (four) hours as needed for wheezing.  LEG CRAMP RELIEF PO Take 1-2 tablets by mouth daily as needed (for leg pain).   lidocaine-prilocaine cream Commonly known as:  EMLA Apply 1 application topically See admin instructions. Apply topically one hour before dialysis - Monday, Wednesday, Friday   loratadine 10 MG tablet Commonly known as:  CLARITIN Take 1 tablet (10 mg total) by mouth daily as needed for allergies.   losartan 100 MG tablet Commonly known as:  COZAAR Take 1 tablet (100 mg total) by mouth daily.   metoprolol succinate 100 MG 24 hr tablet Commonly known as:  TOPROL-XL Take 150 mg by mouth at bedtime. What changed:  Another medication with the same name was removed. Continue taking this medication, and follow the directions you see here.   multivitamin Tabs tablet Take 1 tablet by mouth at bedtime. What changed:  when to take this   omeprazole 40 MG capsule Commonly known as:  PRILOSEC Take 1 capsule (40 mg total) by mouth 2 (two) times daily.   pregabalin 100 MG  capsule Commonly known as:  LYRICA Take 100 mg by mouth at bedtime.   sevelamer carbonate 800 MG tablet Commonly known as:  RENVELA Take 2,400 mg by mouth See admin instructions. Take 3 tablets (2400 mg) by mouth with each meal - 4 times daily      Follow-up Information    Martinique, Betty G, MD. Schedule an appointment as soon as possible for a visit in 1 week(s).   Specialty:  Family Medicine Why:  Hospital Follow Up  Contact information: [address] [place] [address] [phone]          Allergies  Allergen Reactions  . Tuberculin Tests Hives    "blisters"  . Valacyclovir Other (See Comments)    Confusion and nervousness  . Codeine Nausea And Vomiting  . Penicillins Rash    No problems breathing. Has tolerated omnicef in past without issue Has patient had a PCN reaction causing immediate rash, facial/tongue/throat swelling, SOB or lightheadedness with hypotension: Yes Has patient had a PCN reaction causing severe rash involving mucus membranes or skin necrosis: No Has patient had a PCN reaction that required hospitalization No Has patient had a PCN reaction occurring within the last 10 years: No If all of the above answers are "NO", then may proceed with Cephalosporin  . Sulfamethoxazole Rash   Current Discharge Medication List    START taking these medications   Details  doxycycline (VIBRA-TABS) 100 MG tablet Take 1 tablet (100 mg total) by mouth every 12 (twelve) hours. Qty: 10 tablet, Refills: 0      CONTINUE these medications which have CHANGED   Details  cloNIDine (CATAPRES) 0.2 MG tablet Take 1 tablet (0.2 mg total) by mouth 2 (two) times daily.   Associated Diagnoses: Hypertensive urgency      CONTINUE these medications which have NOT CHANGED   Details  acetaminophen (TYLENOL) 500 MG tablet Take 1,000 mg by mouth every 6 (six) hours as needed for mild pain or moderate pain.    albuterol (PROVENTIL HFA;VENTOLIN HFA) 108 (90 Base) MCG/ACT  inhaler Inhale 2 puffs into the lungs every 4 (four) hours as needed for wheezing or shortness of breath. Qty: 1 Inhaler, Refills: 1    allopurinol (ZYLOPRIM) 100 MG tablet Take 100 mg by mouth once daily on non-dialysis days (Tues/Thurs/Sat/Sun) Qty: 30 tablet, Refills: 0    amLODipine (NORVASC) 10 MG tablet Take 1 tablet (10 mg total) by mouth at bedtime. Qty: 90 tablet, Refills: 1  aspirin EC 81 MG tablet Take 81 mg by mouth daily.     atorvastatin (LIPITOR) 40 MG tablet Take 1 tablet (40 mg total) by mouth daily. Qty: 90 tablet, Refills: 3    azelastine (ASTELIN) 0.1 % nasal spray Place 1 spray into both nostrils 2 (two) times daily. Use in each nostril as directed Qty: 30 mL, Refills: 6   Associated Diagnoses: Allergic rhinitis, unspecified chronicity, unspecified seasonality, unspecified trigger    cinacalcet (SENSIPAR) 90 MG tablet Take 90 mg by mouth at bedtime.    DULoxetine (CYMBALTA) 30 MG capsule Take 2 capsules (60 mg total) by mouth daily. Qty: 180 capsule, Refills: 3    fluticasone (FLONASE) 50 MCG/ACT nasal spray Place 2 sprays into both nostrils daily as needed for allergies or rhinitis. Qty: 16 g, Refills: 3   Associated Diagnoses: Sinus congestion    Homeopathic Products (LEG CRAMP RELIEF PO) Take 1-2 tablets by mouth daily as needed (for leg pain).     hydrALAZINE (APRESOLINE) 10 MG tablet Take 1 tablet (10 mg total) by mouth 2 (two) times daily. Qty: 180 tablet, Refills: 1   Associated Diagnoses: Hypertensive renal disease, malignant, with renal failure    ipratropium (ATROVENT HFA) 17 MCG/ACT inhaler Inhale 2 puffs into the lungs every 4 (four) hours as needed for wheezing. Qty: 1 Inhaler, Refills: 12    lidocaine-prilocaine (EMLA) cream Apply 1 application topically See admin instructions. Apply topically one hour before dialysis - Monday, Wednesday, Friday    loratadine (CLARITIN) 10 MG tablet Take 1 tablet (10 mg total) by mouth daily as needed for  allergies. Qty: 90 tablet, Refills: 3   Associated Diagnoses: Sinus congestion    losartan (COZAAR) 100 MG tablet Take 1 tablet (100 mg total) by mouth daily. Qty: 90 tablet, Refills: 1    metoprolol succinate (TOPROL-XL) 100 MG 24 hr tablet Take 150 mg by mouth at bedtime.    multivitamin (RENA-VIT) TABS tablet Take 1 tablet by mouth at bedtime. Qty: 60 tablet, Refills: 5    omeprazole (PRILOSEC) 40 MG capsule Take 1 capsule (40 mg total) by mouth 2 (two) times daily. Qty: 60 capsule, Refills: 11    pregabalin (LYRICA) 100 MG capsule Take 100 mg by mouth at bedtime.     sevelamer carbonate (RENVELA) 800 MG tablet Take 2,400 mg by mouth See admin instructions. Take 3 tablets (2400 mg) by mouth with each meal - 4 times daily      STOP taking these medications     Aluminum & Magnesium Hydroxide 600-300 MG/5ML CONC         Procedures/Studies: Dg Chest 2 View  Result Date: 12/06/2016 CLINICAL DATA:  Chest pint and tightness. Shortness of breath for 2 days. EXAM: CHEST  2 VIEW COMPARISON:  04/19/2016 FINDINGS: Low lung volumes with asymmetric elevation of the right hemidiaphragm as before. There is basilar atelectasis with somewhat confluent opacity in the medial right base raising the question of pneumonia. No pleural effusion. The cardiopericardial silhouette is within normal limits for size. He Ro dialysis graft again noted. The visualized bony structures of the thorax are intact. Radiodense material projecting over the upper abdomen may be related to contrast material within colonic diverticuli. IMPRESSION: Low volume film with bibasilar atelectasis and question of superimposed pneumonia at the right base. Electronically Signed   By: Misty Stanley M.D.   On: 12/06/2016 17:07   Nm Pulmonary Perf And Vent  Result Date: 12/07/2016 CLINICAL DATA:  Acute onset of diaphragmatic pain and  shortness of breath. Initial encounter. EXAM: NUCLEAR MEDICINE VENTILATION - PERFUSION LUNG SCAN  TECHNIQUE: Ventilation images were obtained in multiple projections using inhaled aerosol Tc-44m DTPA. Perfusion images were obtained in multiple projections after intravenous injection of Tc-19m MAA. RADIOPHARMACEUTICALS:  32.8 mCi Technetium-30m DTPA aerosol inhalation and 4.05 mCi Technetium-62m MAA IV COMPARISON:  Chest radiograph performed earlier today at 4:55 p.m. FINDINGS: Ventilation: There is mildly heterogeneous ventilation within both lungs, with vague ventilation defects noted along the periphery of both lungs. No well defined segmental ventilation defects are identified. Perfusion: No wedge shaped peripheral perfusion defects to suggest acute pulmonary embolism. IMPRESSION: Low probability for pulmonary embolus. Mildly heterogeneous ventilation within both lungs. Electronically Signed   By: Garald Balding M.D.   On: 12/07/2016 00:00   Dg Inject Diag/thera/inc Needle/cath/plc Epi/lumb/sac W/img  Result Date: 11/27/2016 CLINICAL DATA:  Lumbosacral spondylosis without myelopathy. Low back and bilateral lower extremity pain, slightly worse on the left. The patient reports excellent relief from the prior epidural injection lasting approximately 7 months. FLUOROSCOPY TIME:  Radiation Exposure Index (as provided by the fluoroscopic device): 22.76 microGray*m^2 Fluoroscopy Time (in minutes and seconds):  9 seconds PROCEDURE: The procedure, risks, benefits, and alternatives were explained to the patient. Questions regarding the procedure were encouraged and answered. The patient understands and consents to the procedure. LUMBAR EPIDURAL INJECTION: An interlaminar approach was performed on the left at L3-4. The overlying skin was cleansed and anesthetized. A 3.5 inch 20 gauge epidural needle was advanced using loss-of-resistance technique. DIAGNOSTIC EPIDURAL INJECTION: Injection of Isovue-M 200 shows a good epidural pattern with spread above and below the level of needle placement, bilaterally but greater  on the left. No vascular opacification is seen. THERAPEUTIC EPIDURAL INJECTION: 120 mg of Depo-Medrol mixed with 3 mL of 1% lidocaine were instilled. The procedure was well-tolerated, and the patient was discharged thirty minutes following the injection in good condition. COMPLICATIONS: None IMPRESSION: Technically successful lumbar interlaminar epidural injection at L3-4. Electronically Signed   By: Logan Bores M.D.   On: 11/27/2016 16:33      Subjective: Pt says she feels better has some chronic back pain, no cough, no fever, no SOB, no Chest pain  Discharge Exam: Vitals:   12/08/16 1230 12/08/16 1259  BP: (!) 151/55 (!) 160/63  Pulse: 64 66  Resp:  (!) 29  Temp:  97.6 F (36.4 C)   Vitals:   12/08/16 1130 12/08/16 1200 12/08/16 1230 12/08/16 1259  BP: (!) 183/64 (!) 166/60 (!) 151/55 (!) 160/63  Pulse: 65 68 64 66  Resp:    (!) 29  Temp:    97.6 F (36.4 C)  TempSrc:    Oral  SpO2:    98%  Weight:    80.5 kg (177 lb 7.5 oz)  Height:       General: Pt is alert, awake, not in acute distress, chronically ill appearing.  Cardiovascular: RRR, S1/S2 +, no rubs, no gallops Respiratory: CTA bilaterally, no wheezing, no rhonchi Abdominal: Soft, NT, ND, bowel sounds + Extremities: no edema, no cyanosis   The results of significant diagnostics from this hospitalization (including imaging, microbiology, ancillary and laboratory) are listed below for reference.     Microbiology: Recent Results (from the past 240 hour(s))  MRSA PCR Screening     Status: None   Collection Time: 12/07/16  4:32 AM  Result Value Ref Range Status   MRSA by PCR NEGATIVE NEGATIVE Final    Comment:  The GeneXpert MRSA Assay (FDA approved for NASAL specimens only), is one component of a comprehensive MRSA colonization surveillance program. It is not intended to diagnose MRSA infection nor to guide or monitor treatment for MRSA infections.      Labs: BNP (last 3 results)  Recent Labs   12/06/16 1620  BNP 9,678.9*   Basic Metabolic Panel:  Recent Labs Lab 12/06/16 1620 12/07/16 0444 12/08/16 0228  NA 136 136 137  K 3.1* 4.0 3.9  CL 96* 98* 99*  CO2 29 28 27   GLUCOSE 73 71 81  BUN 8 16 32*  CREATININE 4.19* 5.73* 7.84*  CALCIUM 8.8* 8.8* 8.9  MG  --  2.0  --   PHOS  --  3.6 5.0*   Liver Function Tests:  Recent Labs Lab 12/07/16 0444 12/08/16 0228  AST 13*  --   ALT 10*  --   ALKPHOS 68  --   BILITOT 0.8  --   PROT 7.2  --   ALBUMIN 2.8* 2.7*   No results for input(s): LIPASE, AMYLASE in the last 168 hours. No results for input(s): AMMONIA in the last 168 hours. CBC:  Recent Labs Lab 12/06/16 1620 12/07/16 0444 12/08/16 0228  WBC 10.5 6.9 8.6  HGB 12.4 11.2* 10.6*  HCT 38.5 35.6* 33.3*  MCV 90.0 90.1 89.8  PLT 207 183 160   Cardiac Enzymes:  Recent Labs Lab 12/07/16 0444 12/07/16 1202 12/07/16 1857  TROPONINI 0.11* 0.08* 0.09*   BNP: Invalid input(s): POCBNP CBG: No results for input(s): GLUCAP in the last 168 hours. D-Dimer No results for input(s): DDIMER in the last 72 hours. Hgb A1c  Recent Labs  12/07/16 0444  HGBA1C 5.1   Lipid Profile No results for input(s): CHOL, HDL, LDLCALC, TRIG, CHOLHDL, LDLDIRECT in the last 72 hours. Thyroid function studies  Recent Labs  12/07/16 0444  TSH 1.464   Anemia work up No results for input(s): VITAMINB12, FOLATE, FERRITIN, TIBC, IRON, RETICCTPCT in the last 72 hours. Urinalysis    Component Value Date/Time   COLORURINE YELLOW 12/30/2014 1419   APPEARANCEUR CLEAR 12/30/2014 1419   LABSPEC 1.009 12/30/2014 1419   PHURINE 7.5 12/30/2014 1419   GLUCOSEU NEGATIVE 12/30/2014 1419   HGBUR NEGATIVE 12/30/2014 1419   HGBUR negative 03/23/2007 1112   BILIRUBINUR NEGATIVE 12/30/2014 1419   KETONESUR NEGATIVE 12/30/2014 1419   PROTEINUR 100 (A) 12/30/2014 1419   UROBILINOGEN 0.2 12/30/2014 1419   NITRITE NEGATIVE 12/30/2014 1419   LEUKOCYTESUR NEGATIVE 12/30/2014 1419    Sepsis Labs Invalid input(s): PROCALCITONIN,  WBC,  LACTICIDVEN Microbiology Recent Results (from the past 240 hour(s))  MRSA PCR Screening     Status: None   Collection Time: 12/07/16  4:32 AM  Result Value Ref Range Status   MRSA by PCR NEGATIVE NEGATIVE Final    Comment:        The GeneXpert MRSA Assay (FDA approved for NASAL specimens only), is one component of a comprehensive MRSA colonization surveillance program. It is not intended to diagnose MRSA infection nor to guide or monitor treatment for MRSA infections.     Time coordinating discharge: 31 mins  SIGNED:  Irwin Brakeman, MD  [facility] 12/08/2016, 2:46 PM Pager [phone]  If 7PM-7AM, please contact night-coverage [email or web address] Password [email or web address]

## 2016-12-08 NOTE — Progress Notes (Signed)
Patient complaining of chronic back pain.  Tylenol 650mg  given at 1115.  Pt continued to complain of pain increasing, Dr. Marval Regal at the bedside and orders received to give Oxycoone 5-325 x2 tabs.  Oxy given at 1223 as ordered.

## 2016-12-08 NOTE — Discharge Instructions (Signed)
Follow with Primary MD  Martin[place], Betty G, MD  and other consultant's as instructed your Hospitalist MD  Please get a complete blood count and chemistry panel checked by your Primary MD at your next visit, and again as instructed by your Primary MD.  Get Medicines reviewed and adjusted: Please take all your medications with you for your next visit with your Primary MD  Laboratory/radiological data: Please request your Primary MD to go over all hospital tests and procedure/radiological results at the follow up, please ask your Primary MD to get all Hospital records sent to his/her office.  In some cases, they will be blood work, cultures and biopsy results pending at the time of your discharge. Please request that your primary care M.D. follows up on these results.  Also Note the following: If you experience worsening of your admission symptoms, develop shortness of breath, life threatening emergency, suicidal or homicidal thoughts you must seek medical attention immediately by calling 911 or calling your MD immediately  if symptoms less severe.  You must read complete instructions/literature along with all the possible adverse reactions/side effects for all the Medicines you take and that have been prescribed to you. Take any new Medicines after you have completely understood and accpet all the possible adverse reactions/side effects.   Do not drive when taking Pain medications or sleeping medications (Benzodaizepines)  Do not take more than prescribed Pain, Sleep and Anxiety Medications. It is not advisable to combine anxiety,sleep and pain medications without talking with your primary care practitioner  Special Instructions: If you have smoked or chewed Tobacco  in the last 2 yrs please stop smoking, stop any regular Alcohol  and or any Recreational drug use.  Wear Seat belts while driving.  Please note: You were cared for by a hospitalist during your hospital stay. Once you are discharged,  your primary care physician will handle any further medical issues. Please note that NO REFILLS for any discharge medications will be authorized once you are discharged, as it is imperative that you return to your primary care physician (or establish a relationship with a primary care physician if you do not have one) for your post hospital discharge needs so that they can reassess your need for medications and monitor your lab values.

## 2016-12-08 NOTE — Progress Notes (Signed)
[facility] Progress Note   Dialysis Orders: [place] MWF 3.75h 180F BFR 400/A1.5x 2K/2.25 Ca UF Profile 2 EDW 81.5kg L AVG HeRO Heparin Bolus 4000 U -Hectorol 4 mcg IV q HD -Mircera 10mcg IV q 4 weeks (last dose 6/20)  Assessment/Plan: 1. ?HCAP - CXR with bibasilar atelectasis and question of superimposed pneumonia at the right base. Empiric Vanc/cefepine started per primary. Of note pt denies any cough or fevers. But dyspnea is improved since hospitalization.  2. Atypical chest pain - Pain with deep inspiration -?sec #1. VQ scan with low probability for PE.  3. Elevated troponin 0.9 >0.11 trending  per primary  4. ESRD -  MWF - K 3.9 . Has pending appt for shuntogram at [facility] with low AF, poor clearance.  5. Hypertension/volume  - Elevated on admission now controlled. On 4 BP meds/ No gross volume on exam- likely needs EDW lowered for better BP control goal today 2.5 L 6. Anemia  - Hgb 11.2 down to 10.6 . Evaluate for possible redose if here Wed as she will be due her q 4 wk dose 7. Metabolic bone disease -  Ca/P ok Cont Hectorol/Renvela binder/Sensipar  8. Nutrition - Renal diet/vitamins/nepro for low albumin 9. Tobacco abuse - ongoing challenge for her - ^ risk of pulmonary infections  Myriam Jacobson, PA-C [place] [phone] 12/08/2016,9:53 AM  LOS: 1 day   Pt seen, examined and agree w A/P as above.  Kelly Splinter MD [employer] pager [phone]   12/08/2016, 12:30 PM    Subjective:   Doing better. No pain with breathing  Objective Vitals:   12/07/16 0457 12/07/16 1000 12/07/16 2100 12/08/16 0411  BP: (!) 196/52 (!) 141/60 (!) 215/65 (!) 184/48  Pulse: 60 68  74  Resp: 18  18 17   Temp: 98.4 F (36.9 C) 99 F (37.2 C) 98.2 F (36.8 C) 98.9 F (37.2 C)  TempSrc: Oral Oral  Oral  SpO2: 94% 96% 99% 93%  Weight:      Height:       Physical Exam General: NAD on HD - sleeping rouses easily Heart: RRR Lungs: no rales or  wheezes - breathing easily on room air Abdomen: soft NT Extremities: no LE edema Dialysis Access: left AVG HERO Qb 400   Additional Objective Labs: Basic Metabolic Panel:  Recent Labs Lab 12/06/16 1620 12/07/16 0444 12/08/16 0228  NA 136 136 137  K 3.1* 4.0 3.9  CL 96* 98* 99*  CO2 29 28 27   GLUCOSE 73 71 81  BUN 8 16 32*  CREATININE 4.19* 5.73* 7.84*  CALCIUM 8.8* 8.8* 8.9  PHOS  --  3.6 5.0*   Liver Function Tests:  Recent Labs Lab 12/07/16 0444 12/08/16 0228  AST 13*  --   ALT 10*  --   ALKPHOS 68  --   BILITOT 0.8  --   PROT 7.2  --   ALBUMIN 2.8* 2.7*   CBC:  Recent Labs Lab 12/06/16 1620 12/07/16 0444 12/08/16 0228  WBC 10.5 6.9 8.6  HGB 12.4 11.2* 10.6*  HCT 38.5 35.6* 33.3*  MCV 90.0 90.1 89.8  PLT 207 183 160   Blood Culture    Component Value Date/Time   SDES URINE, CATHETERIZED 11/14/2013 1549   SPECREQUEST NONE 11/14/2013 1549   CULT NO GROWTH Performed at [place] 11/14/2013 1549   REPTSTATUS 11/15/2013 FINAL 11/14/2013 1549    Cardiac Enzymes:  Recent Labs Lab 12/07/16 0444 12/07/16 1202 12/07/16  1857  TROPONINI 0.11* 0.08* 0.09*    Lab Results  Component Value Date   INR 1.14 11/14/2013   INR 1.10 04/14/2012   INR 1.14 05/06/2010   Studies/Results: Dg Chest 2 View  Result Date: 12/06/2016 CLINICAL DATA:  Chest pint and tightness. Shortness of breath for 2 days. EXAM: CHEST  2 VIEW COMPARISON:  04/19/2016 FINDINGS: Low lung volumes with asymmetric elevation of the right hemidiaphragm as before. There is basilar atelectasis with somewhat confluent opacity in the medial right base raising the question of pneumonia. No pleural effusion. The cardiopericardial silhouette is within normal limits for size. He Ro dialysis graft again noted. The visualized bony structures of the thorax are intact. Radiodense material projecting over the upper abdomen may be related to contrast material within colonic diverticuli.  IMPRESSION: Low volume film with bibasilar atelectasis and question of superimposed pneumonia at the right base. Electronically Signed   By: Misty Stanley M.D.   On: 12/06/2016 17:07   Nm Pulmonary Perf And Vent  Result Date: 12/07/2016 CLINICAL DATA:  Acute onset of diaphragmatic pain and shortness of breath. Initial encounter. EXAM: NUCLEAR MEDICINE VENTILATION - PERFUSION LUNG SCAN TECHNIQUE: Ventilation images were obtained in multiple projections using inhaled aerosol Tc-56m DTPA. Perfusion images were obtained in multiple projections after intravenous injection of Tc-65m MAA. RADIOPHARMACEUTICALS:  32.8 mCi Technetium-51m DTPA aerosol inhalation and 4.05 mCi Technetium-29m MAA IV COMPARISON:  Chest radiograph performed earlier today at 4:55 p.m. FINDINGS: Ventilation: There is mildly heterogeneous ventilation within both lungs, with vague ventilation defects noted along the periphery of both lungs. No well defined segmental ventilation defects are identified. Perfusion: No wedge shaped peripheral perfusion defects to suggest acute pulmonary embolism. IMPRESSION: Low probability for pulmonary embolus. Mildly heterogeneous ventilation within both lungs. Electronically Signed   By: Garald Balding M.D.   On: 12/07/2016 00:00   Medications: . sodium chloride    . ceFEPime (MAXIPIME) IV    . vancomycin     . allopurinol  100 mg Oral Q T,Th,S,Su  . amLODipine  10 mg Oral QHS  . aspirin EC  81 mg Oral Daily  . atorvastatin  40 mg Oral QHS  . cinacalcet  60 mg Oral Q supper  . cloNIDine  0.2 mg Oral BID  . doxercalciferol  4 mcg Intravenous Q M,W,F-HD  . DULoxetine  60 mg Oral QHS  . enoxaparin (LOVENOX) injection  30 mg Subcutaneous Q24H  . famotidine  20 mg Oral Daily  . feeding supplement (NEPRO CARB STEADY)  237 mL Oral BID BM  . guaiFENesin  600 mg Oral BID  . hydrALAZINE  10 mg Oral BID  . losartan  100 mg Oral Daily  . metoprolol succinate  150 mg Oral QHS  . multivitamin  1 tablet Oral  QHS  . nicotine  21 mg Transdermal Daily  . pantoprazole  40 mg Oral Daily  . pregabalin  100 mg Oral QHS  . sevelamer carbonate  2,400 mg Oral TID WC  . sodium chloride flush  3 mL Intravenous Q12H  . sodium chloride flush  3 mL Intravenous Q12H

## 2016-12-09 ENCOUNTER — Telehealth: Payer: Self-pay

## 2016-12-09 NOTE — Consult Note (Signed)
           [facility] CM Primary Care Navigator  12/09/2016  [name] [birth year] [phone]   Went to see patientat the bedsideto identify possible discharge needs but she was alreadydischarge per staffreport .  Patient was discharged home yesterday.  Primary care provider's office called [name] for [facility] notify of patient's discharge and need for post hospital follow-up and transition of care. Notifiedof patient's health issues needing follow-up as well.  Made aware to refer patient to [facility] care management ifdeemed appropriatefor services.    For questions, please contact:  Dannielle Huh, BSN, RN- [facility] Primary Care Navigator  Telephone: [phone] [place]

## 2016-12-09 NOTE — Telephone Encounter (Signed)
D/C 12/08/16 To: home  Spoke with pt and she states that she is doing well overall. She does still feel weak. She is taking all medications as directed. She is starting back to HD 3x/week. She does have multiple family members in the home to help with any assistance she needs. She has no questions or concerns at this time.   Appt scheduled with Dr. Martinique 12/17/16, pt aware.  Patient has been recommended for [facility] referral if you feel necessary.   Transition Care Management Follow-up Telephone Call  How have you been since you were released from the hospital? Good, improving slowly, still weak   Do you understand why you were in the hospital? yes   Do you understand the discharge instrcutions? yes  Items Reviewed:  Medications reviewed: yes  Allergies reviewed: yes  Dietary changes reviewed: yes  Referrals reviewed: yes   Functional Questionnaire:   Activities of Daily Living (ADLs):   She states they are independent in the following: ambulation, bathing and hygiene, feeding, continence, grooming, toileting and dressing States they require assistance with the following: none   Any transportation issues/concerns?: no   Any patient concerns? no   Confirmed importance and date/time of follow-up visits scheduled: yes   Confirmed with patient if condition begins to worsen call PCP or go to the ER.  Patient was given the Call-a-Nurse line [phone]: yes

## 2016-12-10 DIAGNOSIS — N2581 Secondary hyperparathyroidism of renal origin: Secondary | ICD-10-CM | POA: Diagnosis not present

## 2016-12-10 DIAGNOSIS — D631 Anemia in chronic kidney disease: Secondary | ICD-10-CM | POA: Diagnosis not present

## 2016-12-10 DIAGNOSIS — N186 End stage renal disease: Secondary | ICD-10-CM | POA: Diagnosis not present

## 2016-12-12 DIAGNOSIS — D631 Anemia in chronic kidney disease: Secondary | ICD-10-CM | POA: Diagnosis not present

## 2016-12-12 DIAGNOSIS — N186 End stage renal disease: Secondary | ICD-10-CM | POA: Diagnosis not present

## 2016-12-12 DIAGNOSIS — N2581 Secondary hyperparathyroidism of renal origin: Secondary | ICD-10-CM | POA: Diagnosis not present

## 2016-12-15 DIAGNOSIS — N186 End stage renal disease: Secondary | ICD-10-CM | POA: Diagnosis not present

## 2016-12-15 DIAGNOSIS — D631 Anemia in chronic kidney disease: Secondary | ICD-10-CM | POA: Diagnosis not present

## 2016-12-15 DIAGNOSIS — N2581 Secondary hyperparathyroidism of renal origin: Secondary | ICD-10-CM | POA: Diagnosis not present

## 2016-12-16 NOTE — Progress Notes (Signed)
HPI:   [name] is a 76 y.o. female, who is here today to follow on recent hospitalization.  She was admitted on 12/06/16 and discharged on 12/08/16. [place] call 12/09/16.  She presented to the ER c/o 3 days of chest pain and dyspnea, 2-3 weeks of cough with thick sputum and intermittent wheezing.  Dx with HCAP, which was thought to be causing chest pleuritic pain. She was treated with IV Cefepime and Vacomycin and switched to oral Doxycycline. Completed abx treatment on 12/13/16, no side effects. She also has Hx of COPD and diastolic CHF, both stable. Echo 01/2015: LVEF 60-65% with grade 1 diastolic dysfunction.  ESRD: Nephrologists consulted while she was in the hospital, recommended resuming hemodialysis, last one today morning. Elevated troponin , thought to be related to demand ischemia. HTN: Clonidine 0.2 mg bid, rest of meds unchanged (Amlodipine, Cozaar, Hydralazine 10 mg bid,and Metoprolol Succinate).  She is reporting resolution of respiratory symptoms.She denies cough, dyspnea, chest pain, or wheezing. Appetite is "good."  C/O fatigue and unstable gait. She "cannot walk good", states that she can not stand up long enough to cook/prepare meals, having difficulty with bathing, and limiting other activities. She has a shower chair but she cannot get in the shower without assistance. Hx of right foot drop and peripheral neuropathy.  She had an epidural injection on 11/27/16 to treat radicular lower back pain, which helped greatly but she wonders if her balance issue is related to this injection.She denies LE numbness,tingling,or pain. Denies urine/bowel incontinence.   Worsening fatigue, she has Hx of insomnia,stable. She usually wakes up every 2 hours but can go back to sleep. But she is taking longer naps during the day that she did before hospitalization, 3-4 hours instead 30-60 min.  She had labs done today before hemodialysis and has labs every Wednesday.   She is  not checking BP at home but states that before dialysis BP's around 160/70's and after 140-150's/70.  She is also having parietal headache since her hospital discharge. Pain is dull, 5/10, once a day, alleviated by Tylenol 500 mg.She denies Hx of headaches, denies associated nausea,vomiting,   She is still smoking.   Review of Systems  Constitutional: Positive for activity change and fatigue. Negative for appetite change and fever.  HENT: Negative for facial swelling, mouth sores and nosebleeds.   Eyes: Negative for redness and visual disturbance.  Respiratory: Negative for cough, shortness of breath and wheezing.   Cardiovascular: Negative for chest pain, palpitations and leg swelling.  Gastrointestinal: Negative for abdominal pain, nausea and vomiting.       Negative for changes in bowel habits.Last bowel movement today.  Genitourinary: Negative for decreased urine volume, dysuria and hematuria.       Still making urine, mainly at night before bedtime.  Musculoskeletal: Positive for arthralgias (Hx of OA) and gait problem. Negative for back pain and myalgias.  Skin: Negative for rash and wound.  Neurological: Positive for headaches. Negative for syncope, weakness and numbness.  Psychiatric/Behavioral: Positive for sleep disturbance. Negative for confusion. The patient is nervous/anxious.       Current Outpatient Prescriptions on File Prior to Visit  Medication Sig Dispense Refill  . acetaminophen (TYLENOL) 500 MG tablet Take 1,000 mg by mouth every 6 (six) hours as needed for mild pain or moderate pain.    Marland Kitchen albuterol (PROVENTIL HFA;VENTOLIN HFA) 108 (90 Base) MCG/ACT inhaler Inhale 2 puffs into the lungs every 4 (four) hours as needed for  wheezing or shortness of breath. 1 Inhaler 1  . allopurinol (ZYLOPRIM) 100 MG tablet Take 100 mg by mouth once daily on non-dialysis days (Tues/Thurs/Sat/Sun) (Patient taking differently: Take 100 mg by mouth See admin instructions. Take one tablet  (100 mg) by mouth once daily on non-dialysis days (Tues/Thurs/Sat/Sun)) 30 tablet 0  . amLODipine (NORVASC) 10 MG tablet Take 1 tablet (10 mg total) by mouth at bedtime. 90 tablet 1  . aspirin EC 81 MG tablet Take 81 mg by mouth daily.     Marland Kitchen atorvastatin (LIPITOR) 40 MG tablet Take 1 tablet (40 mg total) by mouth daily. (Patient taking differently: Take 40 mg by mouth at bedtime. ) 90 tablet 3  . azelastine (ASTELIN) 0.1 % nasal spray Place 1 spray into both nostrils 2 (two) times daily. Use in each nostril as directed (Patient taking differently: Place 1 spray into both nostrils 2 (two) times daily as needed for rhinitis or allergies. Use in each nostril as directed) 30 mL 6  . cinacalcet (SENSIPAR) 90 MG tablet Take 90 mg by mouth at bedtime.    . cloNIDine (CATAPRES) 0.2 MG tablet Take 1 tablet (0.2 mg total) by mouth 2 (two) times daily.    . DULoxetine (CYMBALTA) 30 MG capsule Take 2 capsules (60 mg total) by mouth daily. (Patient taking differently: Take 60 mg by mouth at bedtime. ) 180 capsule 3  . fluticasone (FLONASE) 50 MCG/ACT nasal spray Place 2 sprays into both nostrils daily as needed for allergies or rhinitis. 16 g 3  . Homeopathic Products (LEG CRAMP RELIEF PO) Take 1-2 tablets by mouth daily as needed (for leg pain).     . hydrALAZINE (APRESOLINE) 10 MG tablet Take 1 tablet (10 mg total) by mouth 2 (two) times daily. 180 tablet 1  . ipratropium (ATROVENT HFA) 17 MCG/ACT inhaler Inhale 2 puffs into the lungs every 4 (four) hours as needed for wheezing. 1 Inhaler 12  . lidocaine-prilocaine (EMLA) cream Apply 1 application topically See admin instructions. Apply topically one hour before dialysis - Monday, Wednesday, Friday    . loratadine (CLARITIN) 10 MG tablet Take 1 tablet (10 mg total) by mouth daily as needed for allergies. 90 tablet 3  . losartan (COZAAR) 100 MG tablet Take 1 tablet (100 mg total) by mouth daily. 90 tablet 1  . metoprolol succinate (TOPROL-XL) 100 MG 24 hr tablet  Take 150 mg by mouth at bedtime.    . multivitamin (RENA-VIT) TABS tablet Take 1 tablet by mouth at bedtime. (Patient taking differently: Take 1 tablet by mouth daily. ) 60 tablet 5  . omeprazole (PRILOSEC) 40 MG capsule Take 1 capsule (40 mg total) by mouth 2 (two) times daily. 60 capsule 11  . pregabalin (LYRICA) 100 MG capsule Take 100 mg by mouth at bedtime.     . sevelamer carbonate (RENVELA) 800 MG tablet Take 2,400 mg by mouth See admin instructions. Take 3 tablets (2400 mg) by mouth with each meal - 4 times daily     No current facility-administered medications on file prior to visit.      Past Medical History:  Diagnosis Date  . Abnormality of gait 03/28/2015  . Adrenal mass ([place])   . 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 Arthritis   . Back pain   . CHF (congestive heart failure) ([place])   . Chronic kidney disease    [place] MWF  . Congestion of throat  Pt states she has a lot mucus in back throat.  . Constipation   . COPD (chronic obstructive pulmonary disease) ([place])   . Depression   . Dialysis patient [facility])    kidney  . Diverticulitis   . GERD (gastroesophageal reflux disease)   . H/O hiatal hernia   . High cholesterol   . Hoarseness of voice   . Hyperlipidemia   . Hypertension   . IBS (irritable bowel syndrome)   . Meralgia paresthetica of right side 12/26/2014  . Normal cardiac stress test 12/24/2009   lexiscan, imaging normal  . Renal disorder   . RLS (restless legs syndrome)   . Seizures ([place])    2004 past brain surgery  . Sinus complaint   . Thyroid disease   . TIA (transient ischemic attack)   . Tubular adenoma of colon 01/2008   Allergies  Allergen Reactions  . Tuberculin Tests Hives    "blisters"  . Valacyclovir Other (See Comments)    Confusion and nervousness  . Codeine Nausea And Vomiting  . Penicillins Rash    No problems breathing. Has tolerated omnicef in past without issue Has patient had a PCN reaction  causing immediate rash, facial/tongue/throat swelling, SOB or lightheadedness with hypotension: Yes Has patient had a PCN reaction causing severe rash involving mucus membranes or skin necrosis: No Has patient had a PCN reaction that required hospitalization No Has patient had a PCN reaction occurring within the last 10 years: No If all of the above answers are "NO", then may proceed with Cephalosporin  . Sulfamethoxazole Rash    Social History   Social History  . Marital status: Single    Spouse name: N/A  . Number of children: 3  . Years of education: 24   Occupational History  . retired    Social History Main Topics  . Smoking status: Current Every Day Smoker    Packs/day: 1.00    Years: 60.00    Types: Cigarettes  . Smokeless tobacco: Never Used     Comment: smoker since 76 yo.  1.5 ppd of [place] 100 lights. ; form given 10-23-16  . Alcohol use No  . Drug use: No     Comment: No hx of IV drug use  . Sexual activity: No   Other Topics Concern  . None   Social History Narrative   ** Merged History Encounter **       Lives in [place] with Daughter, granddaughters (twins) and mentally challenged niece.   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   Patient has a college education.   Patient drinks one cup of caffeine daily.                Vitals:   12/17/16 1532  BP: (!) 148/60  Pulse: 75  Resp: 12  O2 sat at RA 94% Body mass index is 33.54 kg/m.   Physical Exam  Nursing note and vitals reviewed. Constitutional: She is oriented to person, place, and time. She appears well-developed. No distress.  HENT:  Head: Atraumatic.  Mouth/Throat: Oropharynx is clear and moist and mucous membranes are normal. She has dentures.  Eyes: Pupils are equal, round, and reactive to light. Conjunctivae and EOM are normal.  Cardiovascular: Normal rate and regular rhythm.   Murmur (soft SEM LUSB) heard. Pulses:      Posterior tibial pulses are 2+ on the right  side, and 2+ on the left side.  AV fistula RUE  Respiratory: Effort normal and  breath sounds normal. No respiratory distress.  GI: Soft. There is no tenderness.  Musculoskeletal: She exhibits edema (Trace pitting LE edema, bilateral).       Cervical back: She exhibits tenderness. She exhibits no bony tenderness.  Tenderness upon palpation of trapezium muscles bilateral.  Lymphadenopathy:    She has no cervical adenopathy.  Neurological: She is alert and oriented to person, place, and time. Gait abnormal. Coordination normal.  Unstable gait assisted by a cane.  Skin: Skin is warm. No erythema.  Psychiatric: Her mood appears anxious.  Appropriately groomed, good eye contact.    ASSESSMENT AND PLAN:    Ms. Keirsten was seen today for hospitalization follow-up.  Diagnoses and all orders for this visit:  Unstable gait  Fall prevention discussed. She has a walker at home, recommend using it for now. HH for PT referral placed.  -     Ambulatory referral to [place] (healthcare-associated pneumonia)  Resolved. Encouraged smoking cessation.  Hypertensive renal disease, malignant, with renal failure  Re-checked 160/70. Still elevated BP's, she states that BP used to be higher. Low salt diet, continue current antihypertensive meds. Monitor BP's at home in between dialysis. F/U in 2 months.  Other fatigue  We discussed possible causes. Certainly a few of her chronic medical problems can contribute to problem, hospitalization may be the most likely cause at this time. Good sleep hygiene. We will continue monitoring.  ESRD on dialysis [facility])  She is back to her normal hemodialysis schedule 3 times per week. Labs were done today before dialysis.     Himmat Enberg G. Martinique, MD  [facility]. [place] office.

## 2016-12-17 ENCOUNTER — Ambulatory Visit (INDEPENDENT_AMBULATORY_CARE_PROVIDER_SITE_OTHER): Payer: Medicare Other | Admitting: Family Medicine

## 2016-12-17 ENCOUNTER — Encounter: Payer: Self-pay | Admitting: Family Medicine

## 2016-12-17 VITALS — BP 148/60 | HR 75 | Resp 12 | Ht 61.0 in | Wt 177.5 lb

## 2016-12-17 DIAGNOSIS — R5383 Other fatigue: Secondary | ICD-10-CM | POA: Diagnosis not present

## 2016-12-17 DIAGNOSIS — I129 Hypertensive chronic kidney disease with stage 1 through stage 4 chronic kidney disease, or unspecified chronic kidney disease: Secondary | ICD-10-CM | POA: Diagnosis not present

## 2016-12-17 DIAGNOSIS — Z8701 Personal history of pneumonia (recurrent): Secondary | ICD-10-CM | POA: Diagnosis not present

## 2016-12-17 DIAGNOSIS — D631 Anemia in chronic kidney disease: Secondary | ICD-10-CM | POA: Diagnosis not present

## 2016-12-17 DIAGNOSIS — R2681 Unsteadiness on feet: Secondary | ICD-10-CM

## 2016-12-17 DIAGNOSIS — N186 End stage renal disease: Secondary | ICD-10-CM | POA: Diagnosis not present

## 2016-12-17 DIAGNOSIS — N2581 Secondary hyperparathyroidism of renal origin: Secondary | ICD-10-CM | POA: Diagnosis not present

## 2016-12-17 DIAGNOSIS — J189 Pneumonia, unspecified organism: Secondary | ICD-10-CM

## 2016-12-17 DIAGNOSIS — Z992 Dependence on renal dialysis: Secondary | ICD-10-CM | POA: Diagnosis not present

## 2016-12-17 NOTE — Patient Instructions (Signed)
A few things to remember from today's visit:   Unstable gait - Plan: Ambulatory referral to [place] walker. No changes in medications today.   Please be sure medication list is accurate. If a new problem present, please set up appointment sooner than planned today.

## 2016-12-19 DIAGNOSIS — I871 Compression of vein: Secondary | ICD-10-CM | POA: Diagnosis not present

## 2016-12-19 DIAGNOSIS — N2581 Secondary hyperparathyroidism of renal origin: Secondary | ICD-10-CM | POA: Diagnosis not present

## 2016-12-19 DIAGNOSIS — D631 Anemia in chronic kidney disease: Secondary | ICD-10-CM | POA: Diagnosis not present

## 2016-12-19 DIAGNOSIS — N186 End stage renal disease: Secondary | ICD-10-CM | POA: Diagnosis not present

## 2016-12-19 DIAGNOSIS — T82858D Stenosis of vascular prosthetic devices, implants and grafts, subsequent encounter: Secondary | ICD-10-CM | POA: Diagnosis not present

## 2016-12-19 DIAGNOSIS — Z992 Dependence on renal dialysis: Secondary | ICD-10-CM | POA: Diagnosis not present

## 2016-12-22 ENCOUNTER — Emergency Department (HOSPITAL_COMMUNITY): Payer: Medicare Other

## 2016-12-22 ENCOUNTER — Observation Stay (HOSPITAL_COMMUNITY)
Admission: EM | Admit: 2016-12-22 | Discharge: 2016-12-23 | Disposition: A | Discharge status: Home / Self Care | Payer: Medicare Other | Attending: Internal Medicine | Admitting: Internal Medicine

## 2016-12-22 ENCOUNTER — Encounter (HOSPITAL_COMMUNITY): Payer: Self-pay | Admitting: Emergency Medicine

## 2016-12-22 DIAGNOSIS — Z888 Allergy status to other drugs, medicaments and biological substances status: Secondary | ICD-10-CM | POA: Diagnosis not present

## 2016-12-22 DIAGNOSIS — R001 Bradycardia, unspecified: Secondary | ICD-10-CM | POA: Diagnosis not present

## 2016-12-22 DIAGNOSIS — N2581 Secondary hyperparathyroidism of renal origin: Secondary | ICD-10-CM | POA: Diagnosis not present

## 2016-12-22 DIAGNOSIS — R531 Weakness: Principal | ICD-10-CM | POA: Insufficient documentation

## 2016-12-22 DIAGNOSIS — R42 Dizziness and giddiness: Secondary | ICD-10-CM | POA: Insufficient documentation

## 2016-12-22 DIAGNOSIS — J449 Chronic obstructive pulmonary disease, unspecified: Secondary | ICD-10-CM | POA: Insufficient documentation

## 2016-12-22 DIAGNOSIS — E7800 Pure hypercholesterolemia, unspecified: Secondary | ICD-10-CM | POA: Insufficient documentation

## 2016-12-22 DIAGNOSIS — Z7982 Long term (current) use of aspirin: Secondary | ICD-10-CM | POA: Insufficient documentation

## 2016-12-22 DIAGNOSIS — I700 Atherosclerosis of aorta: Secondary | ICD-10-CM | POA: Diagnosis not present

## 2016-12-22 DIAGNOSIS — I5022 Chronic systolic (congestive) heart failure: Secondary | ICD-10-CM | POA: Diagnosis not present

## 2016-12-22 DIAGNOSIS — N186 End stage renal disease: Secondary | ICD-10-CM | POA: Insufficient documentation

## 2016-12-22 DIAGNOSIS — Z79899 Other long term (current) drug therapy: Secondary | ICD-10-CM | POA: Insufficient documentation

## 2016-12-22 DIAGNOSIS — Z880 Allergy status to penicillin: Secondary | ICD-10-CM | POA: Diagnosis not present

## 2016-12-22 DIAGNOSIS — Z887 Allergy status to serum and vaccine status: Secondary | ICD-10-CM | POA: Insufficient documentation

## 2016-12-22 DIAGNOSIS — D631 Anemia in chronic kidney disease: Secondary | ICD-10-CM | POA: Diagnosis not present

## 2016-12-22 DIAGNOSIS — I160 Hypertensive urgency: Secondary | ICD-10-CM | POA: Diagnosis not present

## 2016-12-22 DIAGNOSIS — R7989 Other specified abnormal findings of blood chemistry: Secondary | ICD-10-CM | POA: Diagnosis not present

## 2016-12-22 DIAGNOSIS — F17210 Nicotine dependence, cigarettes, uncomplicated: Secondary | ICD-10-CM | POA: Diagnosis not present

## 2016-12-22 DIAGNOSIS — R300 Dysuria: Secondary | ICD-10-CM | POA: Diagnosis not present

## 2016-12-22 DIAGNOSIS — R748 Abnormal levels of other serum enzymes: Secondary | ICD-10-CM | POA: Diagnosis not present

## 2016-12-22 DIAGNOSIS — Z86010 Personal history of colonic polyps: Secondary | ICD-10-CM | POA: Insufficient documentation

## 2016-12-22 DIAGNOSIS — I132 Hypertensive heart and chronic kidney disease with heart failure and with stage 5 chronic kidney disease, or end stage renal disease: Secondary | ICD-10-CM | POA: Insufficient documentation

## 2016-12-22 DIAGNOSIS — F17200 Nicotine dependence, unspecified, uncomplicated: Secondary | ICD-10-CM | POA: Diagnosis present

## 2016-12-22 DIAGNOSIS — G2581 Restless legs syndrome: Secondary | ICD-10-CM | POA: Insufficient documentation

## 2016-12-22 DIAGNOSIS — Z992 Dependence on renal dialysis: Secondary | ICD-10-CM | POA: Insufficient documentation

## 2016-12-22 DIAGNOSIS — F3341 Major depressive disorder, recurrent, in partial remission: Secondary | ICD-10-CM

## 2016-12-22 DIAGNOSIS — K589 Irritable bowel syndrome without diarrhea: Secondary | ICD-10-CM | POA: Diagnosis not present

## 2016-12-22 DIAGNOSIS — K219 Gastro-esophageal reflux disease without esophagitis: Secondary | ICD-10-CM | POA: Diagnosis not present

## 2016-12-22 DIAGNOSIS — Z885 Allergy status to narcotic agent status: Secondary | ICD-10-CM | POA: Diagnosis not present

## 2016-12-22 DIAGNOSIS — Z8673 Personal history of transient ischemic attack (TIA), and cerebral infarction without residual deficits: Secondary | ICD-10-CM | POA: Insufficient documentation

## 2016-12-22 DIAGNOSIS — F324 Major depressive disorder, single episode, in partial remission: Secondary | ICD-10-CM | POA: Insufficient documentation

## 2016-12-22 DIAGNOSIS — R778 Other specified abnormalities of plasma proteins: Secondary | ICD-10-CM | POA: Diagnosis present

## 2016-12-22 DIAGNOSIS — R5383 Other fatigue: Secondary | ICD-10-CM

## 2016-12-22 LAB — BASIC METABOLIC PANEL
Anion gap: 10 (ref 5–15)
BUN: 12 mg/dL (ref 6–20)
CALCIUM: 9.2 mg/dL (ref 8.9–10.3)
CHLORIDE: 96 mmol/L — AB (ref 101–111)
CO2: 29 mmol/L (ref 22–32)
CREATININE: 4.59 mg/dL — AB (ref 0.44–1.00)
GFR, EST AFRICAN AMERICAN: 10 mL/min — AB (ref >60)
GFR, EST NON AFRICAN AMERICAN: 9 mL/min — AB (ref >60)
Glucose, Bld: 81 mg/dL (ref 65–99)
Potassium: 3.6 mmol/L (ref 3.5–5.1)
SODIUM: 135 mmol/L (ref 135–145)

## 2016-12-22 LAB — CBC
HCT: 36.6 % (ref 36.0–46.0)
Hemoglobin: 11.9 g/dL — ABNORMAL LOW (ref 12.0–15.0)
MCH: 28.6 pg (ref 26.0–34.0)
MCHC: 32.5 g/dL (ref 30.0–36.0)
MCV: 88 fL (ref 78.0–100.0)
PLATELETS: 150 10*3/uL (ref 150–400)
RBC: 4.16 MIL/uL (ref 3.87–5.11)
RDW: 17 % — AB (ref 11.5–15.5)
WBC: 7.7 10*3/uL (ref 4.0–10.5)

## 2016-12-22 LAB — TROPONIN I: TROPONIN I: 0.03 ng/mL — AB (ref <0.03)

## 2016-12-22 MED ORDER — FLUTICASONE PROPIONATE 50 MCG/ACT NA SUSP: 2.0000 | Freq: Every day | NASAL | Status: DC | PRN

## 2016-12-22 MED ORDER — HYDRALAZINE HCL 20 MG/ML IJ SOLN: 5.0000 mg | INTRAMUSCULAR | Status: DC | PRN

## 2016-12-22 MED ORDER — LOSARTAN POTASSIUM 50 MG PO TABS
100.0000 mg | ORAL_TABLET | Freq: Every day | ORAL | Status: DC
  Administered 2016-12-23: 100 mg via ORAL
  Filled 2016-12-22 (×2): qty 2

## 2016-12-22 MED ORDER — RENA-VITE PO TABS
1.0000 | ORAL_TABLET | Freq: Every day | ORAL | Status: DC
  Administered 2016-12-23: 1 via ORAL
  Filled 2016-12-22: qty 1

## 2016-12-22 MED ORDER — LEG CRAMP RELIEF PO TABS: 1.0000 | ORAL_TABLET | Freq: Every day | ORAL | Status: DC | PRN

## 2016-12-22 MED ORDER — HEPARIN SODIUM (PORCINE) 5000 UNIT/ML IJ SOLN: 5000.0000 [IU] | Freq: Three times a day (TID) | INTRAMUSCULAR | Status: DC

## 2016-12-22 MED ORDER — PREGABALIN 100 MG PO CAPS
100.0000 mg | ORAL_CAPSULE | Freq: Every day | ORAL | Status: DC
  Administered 2016-12-23: 100 mg via ORAL
  Filled 2016-12-22: qty 1

## 2016-12-22 MED ORDER — ASPIRIN EC 81 MG PO TBEC
81.0000 mg | DELAYED_RELEASE_TABLET | Freq: Every day | ORAL | Status: DC
  Administered 2016-12-23: 81 mg via ORAL
  Filled 2016-12-22: qty 1

## 2016-12-22 MED ORDER — CLONIDINE HCL 0.2 MG PO TABS
0.2000 mg | ORAL_TABLET | Freq: Two times a day (BID) | ORAL | Status: DC
  Administered 2016-12-23 (×2): 0.2 mg via ORAL
  Filled 2016-12-22 (×2): qty 1

## 2016-12-22 MED ORDER — ATORVASTATIN CALCIUM 40 MG PO TABS
40.0000 mg | ORAL_TABLET | Freq: Every day | ORAL | Status: DC
  Administered 2016-12-23: 40 mg via ORAL
  Filled 2016-12-22: qty 1

## 2016-12-22 MED ORDER — LORATADINE 10 MG PO TABS: 10.0000 mg | ORAL_TABLET | Freq: Every day | ORAL | Status: DC | PRN

## 2016-12-22 MED ORDER — HYDRALAZINE HCL 10 MG PO TABS
10.0000 mg | ORAL_TABLET | Freq: Two times a day (BID) | ORAL | Status: DC
  Administered 2016-12-23 (×2): 10 mg via ORAL
  Filled 2016-12-22 (×3): qty 1

## 2016-12-22 MED ORDER — ACETAMINOPHEN 325 MG PO TABS
650.0000 mg | ORAL_TABLET | Freq: Four times a day (QID) | ORAL | Status: DC | PRN
Start: 2016-12-22 — End: 2016-12-23

## 2016-12-22 MED ORDER — SEVELAMER CARBONATE 800 MG PO TABS
2400.0000 mg | ORAL_TABLET | Freq: Three times a day (TID) | ORAL | Status: DC
Start: 2016-12-23 — End: 2016-12-23
  Administered 2016-12-23: 2400 mg via ORAL
  Filled 2016-12-22 (×2): qty 3

## 2016-12-22 MED ORDER — PANTOPRAZOLE SODIUM 40 MG PO TBEC
40.0000 mg | DELAYED_RELEASE_TABLET | Freq: Every day | ORAL | Status: DC
  Administered 2016-12-23: 40 mg via ORAL
  Filled 2016-12-22: qty 1

## 2016-12-22 MED ORDER — METOPROLOL SUCCINATE ER 50 MG PO TB24
150.0000 mg | ORAL_TABLET | Freq: Every day | ORAL | Status: DC
  Administered 2016-12-23: 02:00:00 150 mg via ORAL
  Filled 2016-12-22: qty 1

## 2016-12-22 MED ORDER — ONDANSETRON HCL 4 MG/2ML IJ SOLN: 4.0000 mg | Freq: Three times a day (TID) | INTRAMUSCULAR | Status: DC | PRN

## 2016-12-22 MED ORDER — LIDOCAINE-PRILOCAINE 2.5-2.5 % EX CREA: 1.0000 "application " | TOPICAL_CREAM | CUTANEOUS | Status: DC

## 2016-12-22 MED ORDER — ALBUTEROL SULFATE (2.5 MG/3ML) 0.083% IN NEBU: 2.5000 mg | INHALATION_SOLUTION | RESPIRATORY_TRACT | Status: DC | PRN

## 2016-12-22 MED ORDER — ALLOPURINOL 100 MG PO TABS: 100.0000 mg | ORAL_TABLET | ORAL | Status: DC

## 2016-12-22 MED ORDER — ZOLPIDEM TARTRATE 5 MG PO TABS: 5.0000 mg | ORAL_TABLET | Freq: Every evening | ORAL | Status: DC | PRN

## 2016-12-22 MED ORDER — DULOXETINE HCL 60 MG PO CPEP
60.0000 mg | ORAL_CAPSULE | Freq: Every day | ORAL | Status: DC
  Administered 2016-12-23: 60 mg via ORAL
  Filled 2016-12-22: qty 1

## 2016-12-22 MED ORDER — CINACALCET HCL 30 MG PO TABS
90.0000 mg | ORAL_TABLET | Freq: Every day | ORAL | Status: DC
  Administered 2016-12-23: 90 mg via ORAL
  Filled 2016-12-22 (×2): qty 3

## 2016-12-22 MED ORDER — AZELASTINE HCL 0.1 % NA SOLN
1.0000 | Freq: Two times a day (BID) | NASAL | Status: DC
  Administered 2016-12-23: 1 via NASAL
  Filled 2016-12-22: qty 30

## 2016-12-22 MED ORDER — AMLODIPINE BESYLATE 5 MG PO TABS
10.0000 mg | ORAL_TABLET | Freq: Every day | ORAL | Status: DC
  Administered 2016-12-23: 10 mg via ORAL
  Filled 2016-12-22: qty 2

## 2016-12-22 MED ORDER — IPRATROPIUM BROMIDE 0.02 % IN SOLN
0.5000 mg | Freq: Four times a day (QID) | RESPIRATORY_TRACT | Status: DC
  Administered 2016-12-23 (×2): 0.5 mg via RESPIRATORY_TRACT
  Filled 2016-12-22 (×2): qty 2.5

## 2016-12-22 MED ORDER — NICOTINE 21 MG/24HR TD PT24: 21.0000 mg | MEDICATED_PATCH | Freq: Every day | TRANSDERMAL | Status: DC

## 2016-12-22 NOTE — ED Triage Notes (Signed)
Patient presents with weakness x 2 weeks. DC'ed from hospital 2 weeks ago, unable to walk until 1 week ago, requires walker. Onset dizziness this morning with is still present. Completed dialysis today. Instructed to follow up at ED for dizziness and weakness.

## 2016-12-22 NOTE — H&P (Addendum)
History and Physical    [name] IWP:[id number] DOB: April 25, 1941 DOA: 12/22/2016  Referring MD/NP/PA:   PCP: [place], Betty G, MD   Patient coming from:  The patient is coming from home.  At baseline, pt is partially dependent for most of ADL.   Chief Complaint: Generalized weakness  HPI: CLARIE [name] is a 76 y.o. female with medical history significant of hypertension, hyperlipidemia, TIA, GERD, gout, depression, tobacco abuse, chronic mild elevation of troponin, seizure, IBS, ESRD-HD (MWF), adrenal mass, abnormal gait, chronic back pain, who presents with generalized weakness.  Patient was recently hospitalized from 7/7-7/9 due to HCAP. Patient was discharged and completed a course of antibiotic treatment. Patient does not have cough, shortness of breath and chest pain today. She states that she has been feeling weak for one week. She has generalized weakness, cannot walk even with a walker. Patient states that she had dizziness in the early morning, which has resolved after complete to dialysis today. She does not have unilateral numbness, tingliness in extremities. No vision change, hearing loss, slurred speech, facial droop. Patient denies nausea, vomiting, diarrhea, abdominal pain. She states that occasionally she has dysuria on urination. States her primary care doctor has arranged for physical therapy at home, but will not be there until Thursday. She had dialysis today.  ED Course: pt was found to have WBC 7.7, temperature normal, bradycardia, oxygen saturation 96% on room air, elevated blood pressure 212/64-->161/48, creatinine 4.59, potassium 3.6, bicarbonate 29, BUN 12. Pt is placed on tele bed for obs  CXR showed  1.  No acute cardiopulmonary disease. 2. Stable mild cardiomegaly without pulmonary edema. 3. Stable chronic elevation of the right hemidiaphragm and chronic atelectasis/scar involving the right lower lobe. Stable chronic scar involving the inferior left upper  lobe.  Review of Systems:   General: no fevers, chills, no changes in body weight, has poor appetite, has fatigue HEENT: no blurry vision, hearing changes or sore throat Respiratory: no dyspnea, coughing, wheezing CV: no chest pain, no palpitations GI: no nausea, vomiting, abdominal pain, diarrhea, constipation GU: has dysuria, burning on urination, no increased urinary frequency, hematuria  Ext: no leg edema Neuro: no unilateral weakness, numbness, or tingling, no vision change or hearing loss Skin: no rash, no skin tear. MSK: No muscle spasm, no deformity, no limitation of range of movement in spin Heme: No easy bruising.  Travel history: No recent long distant travel.  Allergy:  Allergies  Allergen Reactions  . Tuberculin Tests Hives    "blisters"  . Valacyclovir Other (See Comments)    Confusion and nervousness  . Codeine Nausea And Vomiting  . Penicillins Rash    No problems breathing. Has tolerated omnicef in past without issue Has patient had a PCN reaction causing immediate rash, facial/tongue/throat swelling, SOB or lightheadedness with hypotension: Yes Has patient had a PCN reaction causing severe rash involving mucus membranes or skin necrosis: No Has patient had a PCN reaction that required hospitalization No Has patient had a PCN reaction occurring within the last 10 years: No If all of the above answers are "NO", then may proceed with Cephalosporin  . Sulfamethoxazole Rash    Past Medical History:  Diagnosis Date  . Abnormality of gait 03/28/2015  . Adrenal mass ([place])   . 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 Arthritis   . Back pain   . CHF (congestive heart failure) ([place])   . Chronic kidney disease  [place] MWF  . Congestion of throat    Pt states she has a lot mucus in back throat.  . Constipation   . COPD (chronic obstructive pulmonary disease) ([place])   . Depression   . Dialysis patient [facility])    kidney  .  Diverticulitis   . GERD (gastroesophageal reflux disease)   . H/O hiatal hernia   . High cholesterol   . Hoarseness of voice   . Hyperlipidemia   . Hypertension   . IBS (irritable bowel syndrome)   . Meralgia paresthetica of right side 12/26/2014  . Normal cardiac stress test 12/24/2009   lexiscan, imaging normal  . Renal disorder   . RLS (restless legs syndrome)   . Seizures ([place])    2004 past brain surgery  . Sinus complaint   . Thyroid disease   . TIA (transient ischemic attack)   . Tubular adenoma of colon 01/2008    Past Surgical History:  Procedure Laterality Date  . ABDOMINAL HYSTERECTOMY    . ANAL RECTAL MANOMETRY N/A 07/25/2015   Procedure: ANO RECTAL MANOMETRY;  Surgeon: Mauri Pole, MD;  Location: [facility];  Service: Endoscopy;  Laterality: N/A;  . APPENDECTOMY    . AV FISTULA PLACEMENT Left 11/11/2012   Procedure: INSERTION OF ARTERIOVENOUS (AV) GORE-TEX GRAFT ARM;  Surgeon: Angelia Mould, MD;  Location: [place];  Service: Vascular;  Laterality: Left;  . [place] REMOVAL Left 11/18/2012   Procedure: REMOVAL OF LEFT UPPER ARM ARTERIOVENOUS GORETEX GRAFT ([place]);  Surgeon: Angelia Mould, MD;  Location: [place];  Service: Vascular;  Laterality: Left;  . CARDIAC CATHETERIZATION  2003   normal  . CHOLECYSTECTOMY     Open mid line incision  . frontal craniotomy  2002   indication = sinusitis  . INSERTION OF DIALYSIS CATHETER     Left  . PATCH ANGIOPLASTY Left 11/18/2012   Procedure: PATCH ANGIOPLASTY;  Surgeon: Angelia Mould, MD;  Location: [place];  Service: Vascular;  Laterality: Left;  . PERIPHERAL VASCULAR CATHETERIZATION Left 03/08/2015   Procedure: A/V Shuntogram/Fistulagram;  Surgeon: Algernon Huxley, MD;  Location: [place] [facility];  Service: Cardiovascular;  Laterality: Left;  . PERIPHERAL VASCULAR CATHETERIZATION Left 03/08/2015   Procedure: A/V Shunt Intervention;  Surgeon: Algernon Huxley, MD;  Location: [place] [facility];  Service:  Cardiovascular;  Laterality: Left;  . RECTAL ULTRASOUND N/A 10/05/2015   Procedure: ANAL ULTRASOUND WITH PROBE;  Surgeon: Leighton Ruff, MD;  Location: [facility];  Service: Endoscopy;  Laterality: N/A;  . REVISON OF ARTERIOVENOUS FISTULA  05/11/2012   Procedure: REVISON OF ARTERIOVENOUS FISTULA;  Surgeon: Elam Dutch, MD;  Location: [place];  Service: Vascular;  Laterality: Right;  . TUBAL LIGATION      Social History:  reports that she has been smoking Cigarettes.  She has a 60.00 pack-year smoking history. She has never used smokeless tobacco. She reports that she does not drink alcohol or use drugs.  Family History:  Family History  Problem Relation Age of Onset  . Thyroid cancer Mother   . Heart disease Father   . Hypertension Father   . Heart attack Father   . Lupus Daughter   . Breast cancer Sister   . Thyroid cancer Sister   . Kidney disease Sister   . Colon cancer Neg Hx      Prior to Admission medications   Medication Sig Start Date End Date Taking? Authorizing Provider  acetaminophen (TYLENOL) 500 MG tablet Take 1,000  mg by mouth every 6 (six) hours as needed for mild pain or moderate pain.   Yes [provider]  albuterol (PROVENTIL HFA;VENTOLIN HFA) 108 (90 Base) MCG/ACT inhaler Inhale 2 puffs into the lungs every 4 (four) hours as needed for wheezing or shortness of breath. 06/05/15  Yes Gottschalk, Leatrice Jewels M, DO  allopurinol (ZYLOPRIM) 100 MG tablet Take 100 mg by mouth once daily on non-dialysis days (Tues/Thurs/Sat/Sun) Patient taking differently: Take 100 mg by mouth See admin instructions. Take one tablet (100 mg) by mouth once daily on non-dialysis days (Tues/Thurs/Sat/Sun) 11/13/15  Yes Smiley Houseman, MD  amLODipine (NORVASC) 10 MG tablet Take 1 tablet (10 mg total) by mouth at bedtime. 11/12/16  Yes Martinique, Betty G, MD  aspirin EC 81 MG tablet Take 81 mg by mouth daily.    Yes [provider]  atorvastatin (LIPITOR) 40 MG tablet Take 1  tablet (40 mg total) by mouth daily. Patient taking differently: Take 40 mg by mouth at bedtime.  02/26/15  Yes Rumley, Lawton N, DO  azelastine (ASTELIN) 0.1 % nasal spray Place 1 spray into both nostrils 2 (two) times daily. Use in each nostril as directed Patient taking differently: Place 1 spray into both nostrils 2 (two) times daily as needed for rhinitis or allergies. Use in each nostril as directed 08/07/16  Yes [place]ique, Betty G, MD  cinacalcet (SENSIPAR) 90 MG tablet Take 90 mg by mouth at bedtime.   Yes [provider]  cloNIDine (CATAPRES) 0.2 MG tablet Take 1 tablet (0.2 mg total) by mouth 2 (two) times daily. 12/08/16  Yes Johnson, Clanford L, MD  DULoxetine (CYMBALTA) 30 MG capsule Take 2 capsules (60 mg total) by mouth daily. Patient taking differently: Take 60 mg by mouth at bedtime.  11/13/16  Yes Ward Givens, NP  hydrALAZINE (APRESOLINE) 10 MG tablet Take 1 tablet (10 mg total) by mouth 2 (two) times daily. 11/12/16  Yes Martinique, Betty G, MD  lidocaine-prilocaine (EMLA) cream Apply 1 application topically See admin instructions. Apply topically one hour before dialysis - Monday, Wednesday, Friday   Yes [provider]  loratadine (CLARITIN) 10 MG tablet Take 1 tablet (10 mg total) by mouth daily as needed for allergies. 08/09/16  Yes Martinique, Betty G, MD  losartan (COZAAR) 100 MG tablet Take 1 tablet (100 mg total) by mouth daily. 11/12/16  Yes Martinique, Betty G, MD  metoprolol succinate (TOPROL-XL) 100 MG 24 hr tablet Take 150 mg by mouth at bedtime. 12/06/16  Yes [provider]  multivitamin (RENA-VIT) TABS tablet Take 1 tablet by mouth at bedtime. Patient taking differently: Take 1 tablet by mouth daily.  01/01/15  Yes Haney, Alyssa A, MD  omeprazole (PRILOSEC) 40 MG capsule Take 1 capsule (40 mg total) by mouth 2 (two) times daily. 12/27/15  Yes Ladene Artist, MD  pregabalin (LYRICA) 100 MG capsule Take 100 mg by mouth at bedtime.    Yes [provider]   sevelamer carbonate (RENVELA) 800 MG tablet Take 2,400 mg by mouth See admin instructions. Take 3 tablets (2400 mg) by mouth with each meal - 4 times daily   Yes [provider]  fluticasone (FLONASE) 50 MCG/ACT nasal spray Place 2 sprays into both nostrils daily as needed for allergies or rhinitis. 05/08/16   Rogue Bussing, MD  Homeopathic Products (LEG CRAMP RELIEF PO) Take 1-2 tablets by mouth daily as needed (for leg pain).     [provider]  ipratropium (ATROVENT HFA) 17  MCG/ACT inhaler Inhale 2 puffs into the lungs every 4 (four) hours as needed for wheezing. 06/05/15   Janora Norlander, DO    Physical Exam: Vitals:   12/22/16 1628 12/22/16 1652 12/22/16 1930 12/22/16 1945  BP: (!) 212/48 (!) 212/63 (!) 198/61 (!) 161/48  Pulse: 61  63 (!) 59  Resp: 17  (!) 21 17  Temp: 98.5 F (36.9 C)     TempSrc: Oral     SpO2: 97%  96% 96%   General: Not in acute distress HEENT:       Eyes: PERRL, EOMI, no scleral icterus.       ENT: No discharge from the ears and nose, no pharynx injection, no tonsillar enlargement.        Neck: No JVD, no bruit, no mass felt. Heme: No neck lymph node enlargement. Cardiac: S1/S2, RRR, No murmurs, No gallops or rubs. Respiratory:  No rales, wheezing, rhonchi or rubs. GI: Soft, nondistended, nontender, no rebound pain, no organomegaly, BS present. GU: No hematuria Ext: No pitting leg edema bilaterally. 2+DP/PT pulse bilateally. Has functioning AVF in left arm. Musculoskeletal: No joint deformities, No joint redness or warmth, no limitation of ROM in spin. Skin: No rashes.  Neuro: Alert, oriented X3, cranial nerves II-XII grossly intact, moves all extremities. Psych: Patient is not psychotic, no suicidal or hemocidal ideation.  Labs on Admission: I have personally reviewed following labs and imaging studies  CBC:  Recent Labs Lab 12/22/16 1708  WBC 7.7  HGB 11.9*  HCT 36.6  MCV 88.0  PLT 992   Basic Metabolic  Panel:  Recent Labs Lab 12/22/16 1708  NA 135  K 3.6  CL 96*  CO2 29  GLUCOSE 81  BUN 12  CREATININE 4.59*  CALCIUM 9.2   GFR: Estimated Creatinine Clearance: 10.2 mL/min (A) (by C-G formula based on SCr of 4.59 mg/dL (H)). Liver Function Tests: No results for input(s): AST, ALT, ALKPHOS, BILITOT, PROT, ALBUMIN in the last 168 hours. No results for input(s): LIPASE, AMYLASE in the last 168 hours. No results for input(s): AMMONIA in the last 168 hours. Coagulation Profile: No results for input(s): INR, PROTIME in the last 168 hours. Cardiac Enzymes:  Recent Labs Lab 12/22/16 1708 12/23/16 0152  TROPONINI 0.03* 0.03*   BNP (last 3 results) No results for input(s): PROBNP in the last 8760 hours. HbA1C: No results for input(s): HGBA1C in the last 72 hours. CBG: No results for input(s): GLUCAP in the last 168 hours. Lipid Profile: No results for input(s): CHOL, HDL, LDLCALC, TRIG, CHOLHDL, LDLDIRECT in the last 72 hours. Thyroid Function Tests: No results for input(s): TSH, T4TOTAL, FREET4, T3FREE, THYROIDAB in the last 72 hours. Anemia Panel: No results for input(s): VITAMINB12, FOLATE, FERRITIN, TIBC, IRON, RETICCTPCT in the last 72 hours. Urine analysis:    Component Value Date/Time   COLORURINE YELLOW 12/30/2014 1419   APPEARANCEUR CLEAR 12/30/2014 1419   LABSPEC 1.009 12/30/2014 1419   PHURINE 7.5 12/30/2014 1419   GLUCOSEU NEGATIVE 12/30/2014 1419   HGBUR NEGATIVE 12/30/2014 1419   HGBUR negative 03/23/2007 1112   BILIRUBINUR NEGATIVE 12/30/2014 1419   KETONESUR NEGATIVE 12/30/2014 1419   PROTEINUR 100 (A) 12/30/2014 1419   UROBILINOGEN 0.2 12/30/2014 1419   NITRITE NEGATIVE 12/30/2014 1419   LEUKOCYTESUR NEGATIVE 12/30/2014 1419   Sepsis Labs: @LABRCNTIP (procalcitonin:4,lacticidven:4) )No results found for this or any previous visit (from the past 240 hour(s)).   Radiological Exams on Admission: Dg Chest 2 View  Result Date: 12/22/2016 CLINICAL  DATA:  Two week history of generalized weakness and dizziness. Current history of end-stage renal disease on hemodialysis. EXAM: CHEST  2 VIEW COMPARISON:  12/06/2016, 04/19/2016 and earlier. FINDINGS: AP erect and lateral images were obtained. Cardiac silhouette mildly enlarged for technique, unchanged. Thoracic aorta atherosclerotic, unchanged. Hilar and mediastinal contours otherwise unremarkable. Stable linear Linear scarring in the inferior left upper lobe. Stable chronic elevation of the right hemidiaphragm with chronic scar/atelectasis involving the right lower lobe. No new pulmonary parenchymal abnormalities. No pleural effusions. Normal pulmonary vascularity. Degenerative changes involving the thoracic and upper lumbar spine. Left axillary endovascular dialysis device again noted with its tip in the right atrium, unchanged. IMPRESSION: 1.  No acute cardiopulmonary disease. 2. Stable mild cardiomegaly without pulmonary edema. 3. Stable chronic elevation of the right hemidiaphragm and chronic atelectasis/scar involving the right lower lobe. Stable chronic scar involving the inferior left upper lobe. 4.  Aortic Atherosclerosis (ICD10-170.0) Electronically Signed   By: Evangeline Dakin M.D.   On: 12/22/2016 20:08     EKG: Independently reviewed.  QTC 454, poor R-wave progression, nonspecific T-wave change.   Assessment/Plan Principal Problem:   Generalized weakness Active Problems:   HYPERCHOLESTEROLEMIA   TOBACCO ABUSE   Major depression in partial remission (HCC)   Hypertensive urgency   ESRD on dialysis (HCC)   Chronic obstructive pulmonary disease (HCC)   End stage renal disease (HCC)   Elevated troponin   Generalized weakness: Patient has generalized weakness. No focal neurological findings on physical examination, less likely to have stroke. Most likely due to generalized deconditioning due to multiple comorbidities.  -will place on tele bed for obs -PT/OT -f/u UA  Hypertensive  urgency: bp 212/63-->161/48. -IV hydralazine when necessary -Continue amlodipine, clonidine, oral hydralazine, Cozaar and metoprolol  Depression: Stable, no suicidal or homicidal ideations. -Continue home medications: Cymbalta  HLD: -Lipitor  Tobacco abuse: -Did counseling about importance of quitting smoking -Nicotine patch  COPD: stable -prn albuterol nebulizer -Atrovent  ESRD on dialysis [facility]): -continue Sensipar -need to call renal if pt stay until [place] (less likely)  GERD:  -Protonix  Chronically elevated troponin: The patient trop 0.09-0.11 recently. Today her troponin is 0.03. Patient does not have chest pain. Likely due to decreased clearance secondary to ESRD -trend trop x 3 -continue ASA and lipitor   DVT ppx: SQ Heparin   Code Status: Full code Family Communication:   Yes, patient's  son  at bed side Disposition Plan:  Anticipate discharge back to previous home environment Consults called:  none Admission status: Obs / tele     Date of Service 12/23/2016    Ivor Costa [facility] Pager [phone]  If 7PM-7AM, please contact night-coverage [email or web address] Password [facility] 12/23/2016, 2:44 AM

## 2016-12-22 NOTE — ED Notes (Signed)
CRITICAL VALUE ALERT  Critical Value: Troponin 0.03

## 2016-12-22 NOTE — ED Notes (Signed)
Patient transported to X-ray 

## 2016-12-22 NOTE — ED Notes (Signed)
Pt states she is oliguric and she is refusing to have a in and out cath at this time.

## 2016-12-22 NOTE — ED Notes (Signed)
pts significant other inquiring how much longer before patient gets to a room, advised that patient is one of the next people to go back to a room

## 2016-12-22 NOTE — ED Notes (Signed)
Pt stated she does not always make urine

## 2016-12-22 NOTE — ED Provider Notes (Signed)
[facility] DEPT Provider Note   CSN: [id number] Arrival date & time: 12/22/16  1550     History   Chief Complaint Chief Complaint  Patient presents with  . Weakness    HPI [name] is a 76 y.o. female.  HPI Patient presents with generalized weakness. His had it for the last several days. Recently was inpatient with hospital associated pneumonia. Has finished up antibiotics. Has been seen by her doctor since then. States when she left the hospital she could not walk but then improved with walk with a walker. Now she cannot walk again. No chest pain. She is a dialysis patient was dialyzed today. States that her weight had been high. They have had to wait her on a wheelchair though because she could not walk. States she gets cramping week with the dialysis. No fevers. States she's had a good appetite. States her primary care doctor is arranged for physical therapy at home but will not be there until Thursday. States she's not been able to get up or do anything at home.     Past Medical History:  Diagnosis Date  . Abnormality of gait 03/28/2015  . Adrenal mass ([place])   . 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 Arthritis   . Back pain   . CHF (congestive heart failure) ([place])   . Chronic kidney disease    Hemo MWF  . Congestion of throat    Pt states she has a lot mucus in back throat.  . Constipation   . COPD (chronic obstructive pulmonary disease) ([place])   . Depression   . Dialysis patient [facility])    kidney  . Diverticulitis   . GERD (gastroesophageal reflux disease)   . H/O hiatal hernia   . High cholesterol   . Hoarseness of voice   . Hyperlipidemia   . Hypertension   . IBS (irritable bowel syndrome)   . Meralgia paresthetica of right side 12/26/2014  . Normal cardiac stress test 12/24/2009   lexiscan, imaging normal  . Renal disorder   . RLS (restless legs syndrome)   . Seizures ([place])    2004 past brain surgery  . Sinus  complaint   . Thyroid disease   . TIA (transient ischemic attack)   . Tubular adenoma of colon 01/2008    Patient Active Problem List   Diagnosis Date Noted  . Elevated troponin 12/06/2016  . HCAP (healthcare-associated pneumonia) 12/06/2016  . Hypokalemia 12/06/2016  . Chronic systolic CHF (congestive heart failure) ([place]) 12/06/2016  . Osteopenia 09/08/2016  . Allergic rhinitis 08/07/2016  . BMI 32.0-32.9,adult 08/07/2016  . Onychomycosis 05/09/2016  . Nonspecific chest pain 04/19/2016  . ACS (acute coronary syndrome) ([place]) 04/19/2016  . Leg pain, bilateral 12/05/2015  . Trouble in sleeping 11/22/2015  . End stage renal disease ([place])   . Hyperkalemia 11/12/2015  . ESRD on dialysis ([place])   . Chronic obstructive pulmonary disease ([place])   . Hypertensive urgency 09/20/2015  . Fecal incontinence   . Adverse effects of medication 06/21/2015  . Herpes 06/19/2015  . Skin lesion 06/16/2015  . Recurrent genital herpes 06/16/2015  . Onychocryptosis 04/08/2015  . Renal mass   . End stage renal disease on dialysis ([place])   . Inadequate pain control 12/30/2014  . CHF (congestive heart failure) ([place]) 12/30/2014  . Meralgia paresthetica of right side 12/26/2014  . Hereditary and idiopathic peripheral neuropathy 02/15/2013  . Dermatitis of face 08/19/2012  .  HIP PAIN, BILATERAL 12/07/2008  . BLADDER PROLAPSE 11/17/2007  . OSTEOARTHRITIS, GENERALIZED, MULTIPLE JOINTS 08/18/2007  . 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 ANXIETY STATE, UNSPECIFIED 04/23/2007  . ANEMIA NEC 03/31/2007  . TOBACCO ABUSE 12/10/2006  . HIATAL HERNIA 12/10/2006  . Dysphagia 12/10/2006  . HYPERCHOLESTEROLEMIA 07/30/2006  . Gout, unspecified 07/30/2006  . Major depression in partial remission ([place]) 07/30/2006  . Hypertensive renal disease, malignant, with renal failure 07/30/2006  . GASTROESOPHAGEAL REFLUX, NO ESOPHAGITIS 07/30/2006    Past Surgical History:  Procedure Laterality Date  .  ABDOMINAL HYSTERECTOMY    . ANAL RECTAL MANOMETRY N/A 07/25/2015   Procedure: ANO RECTAL MANOMETRY;  Surgeon: Mauri Pole, MD;  Location: [facility];  Service: Endoscopy;  Laterality: N/A;  . APPENDECTOMY    . AV FISTULA PLACEMENT Left 11/11/2012   Procedure: INSERTION OF ARTERIOVENOUS (AV) GORE-TEX GRAFT ARM;  Surgeon: Angelia Mould, MD;  Location: [facility];  Service: Vascular;  Laterality: Left;  . [place] REMOVAL Left 11/18/2012   Procedure: REMOVAL OF LEFT UPPER ARM ARTERIOVENOUS GORETEX GRAFT ([place]);  Surgeon: Angelia Mould, MD;  Location: [facility];  Service: Vascular;  Laterality: Left;  . CARDIAC CATHETERIZATION  2003   normal  . CHOLECYSTECTOMY     Open mid line incision  . frontal craniotomy  2002   indication = sinusitis  . INSERTION OF DIALYSIS CATHETER     Left  . PATCH ANGIOPLASTY Left 11/18/2012   Procedure: PATCH ANGIOPLASTY;  Surgeon: Angelia Mould, MD;  Location: [facility];  Service: Vascular;  Laterality: Left;  . PERIPHERAL VASCULAR CATHETERIZATION Left 03/08/2015   Procedure: A/V Shuntogram/Fistulagram;  Surgeon: Algernon Huxley, MD;  Location: [facility] CV LAB;  Service: Cardiovascular;  Laterality: Left;  . PERIPHERAL VASCULAR CATHETERIZATION Left 03/08/2015   Procedure: A/V Shunt Intervention;  Surgeon: Algernon Huxley, MD;  Location: [place] CV LAB;  Service: Cardiovascular;  Laterality: Left;  . RECTAL ULTRASOUND N/A 10/05/2015   Procedure: ANAL ULTRASOUND WITH PROBE;  Surgeon: Leighton Ruff, MD;  Location: [facility];  Service: Endoscopy;  Laterality: N/A;  . REVISON OF ARTERIOVENOUS FISTULA  05/11/2012   Procedure: REVISON OF ARTERIOVENOUS FISTULA;  Surgeon: Elam Dutch, MD;  Location: [facility];  Service: Vascular;  Laterality: Right;  . TUBAL LIGATION      OB History    Gravida Para Term Preterm AB Living   5 3 3          SAB TAB Ectopic Multiple Live Births                   Home Medications    Prior to Admission medications     Medication Sig Start Date End Date Taking? Authorizing Provider  acetaminophen (TYLENOL) 500 MG tablet Take 1,000 mg by mouth every 6 (six) hours as needed for mild pain or moderate pain.   Yes [provider]  albuterol (PROVENTIL HFA;VENTOLIN HFA) 108 (90 Base) MCG/ACT inhaler Inhale 2 puffs into the lungs every 4 (four) hours as needed for wheezing or shortness of breath. 06/05/15  Yes Gottschalk, Leatrice Jewels M, DO  allopurinol (ZYLOPRIM) 100 MG tablet Take 100 mg by mouth once daily on non-dialysis days (Tues/Thurs/Sat/Sun) Patient taking differently: Take 100 mg by mouth See admin instructions. Take one tablet (100 mg) by mouth once daily on non-dialysis days (Tues/Thurs/Sat/Sun) 11/13/15  Yes Smiley Houseman, MD  amLODipine (NORVASC) 10 MG tablet Take 1 tablet (10 mg total) by mouth at bedtime. 11/12/16  Yes  [place], Betty G, MD  aspirin EC 81 MG tablet Take 81 mg by mouth daily.    Yes [provider]  atorvastatin (LIPITOR) 40 MG tablet Take 1 tablet (40 mg total) by mouth daily. Patient taking differently: Take 40 mg by mouth at bedtime.  02/26/15  Yes Rumley, Arboles N, DO  azelastine (ASTELIN) 0.1 % nasal spray Place 1 spray into both nostrils 2 (two) times daily. Use in each nostril as directed Patient taking differently: Place 1 spray into both nostrils 2 (two) times daily as needed for rhinitis or allergies. Use in each nostril as directed 08/07/16  Yes [place], Betty G, MD  cinacalcet (SENSIPAR) 90 MG tablet Take 90 mg by mouth at bedtime.   Yes [provider]  cloNIDine (CATAPRES) 0.2 MG tablet Take 1 tablet (0.2 mg total) by mouth 2 (two) times daily. 12/08/16  Yes Johnson, Clanford L, MD  DULoxetine (CYMBALTA) 30 MG capsule Take 2 capsules (60 mg total) by mouth daily. Patient taking differently: Take 60 mg by mouth at bedtime.  11/13/16  Yes Ward Givens, NP  hydrALAZINE (APRESOLINE) 10 MG tablet Take 1 tablet (10 mg total) by mouth 2 (two) times daily. 11/12/16   Yes [place], Betty G, MD  lidocaine-prilocaine (EMLA) cream Apply 1 application topically See admin instructions. Apply topically one hour before dialysis - Monday, Wednesday, Friday   Yes [provider]  loratadine (CLARITIN) 10 MG tablet Take 1 tablet (10 mg total) by mouth daily as needed for allergies. 08/09/16  Yes [place], Betty G, MD  losartan (COZAAR) 100 MG tablet Take 1 tablet (100 mg total) by mouth daily. 11/12/16  Yes [place], Betty G, MD  metoprolol succinate (TOPROL-XL) 100 MG 24 hr tablet Take 150 mg by mouth at bedtime. 12/06/16  Yes [provider]  multivitamin (RENA-VIT) TABS tablet Take 1 tablet by mouth at bedtime. Patient taking differently: Take 1 tablet by mouth daily.  01/01/15  Yes Haney, Alyssa A, MD  omeprazole (PRILOSEC) 40 MG capsule Take 1 capsule (40 mg total) by mouth 2 (two) times daily. 12/27/15  Yes Ladene Artist, MD  pregabalin (LYRICA) 100 MG capsule Take 100 mg by mouth at bedtime.    Yes [provider]  sevelamer carbonate (RENVELA) 800 MG tablet Take 2,400 mg by mouth See admin instructions. Take 3 tablets (2400 mg) by mouth with each meal - 4 times daily   Yes [provider]  fluticasone (FLONASE) 50 MCG/ACT nasal spray Place 2 sprays into both nostrils daily as needed for allergies or rhinitis. 05/08/16   Rogue Bussing, MD  Homeopathic Products (LEG CRAMP RELIEF PO) Take 1-2 tablets by mouth daily as needed (for leg pain).     [provider]  ipratropium (ATROVENT HFA) 17 MCG/ACT inhaler Inhale 2 puffs into the lungs every 4 (four) hours as needed for wheezing. 06/05/15   Janora Norlander, DO    Family History Family History  Problem Relation Age of Onset  . Thyroid cancer Mother   . Heart disease Father   . Hypertension Father   . Heart attack Father   . Lupus Daughter   . Breast cancer Sister   . Thyroid cancer Sister   . Kidney disease Sister   . Colon cancer Neg Hx     Social  History Social History  Substance Use Topics  . Smoking status: Current Every Day Smoker    Packs/day: 1.00    Years: 60.00    Types: Cigarettes  .  Smokeless tobacco: Never Used     Comment: smoker since 76 yo.  1.5 ppd of [place] 100 lights. ; form given 10-23-16  . Alcohol use No     Allergies   Tuberculin tests; Valacyclovir; Codeine; Penicillins; and Sulfamethoxazole   Review of Systems Review of Systems  Constitutional: Negative for appetite change.  HENT: Negative for congestion.   Respiratory: Negative for cough and shortness of breath.   Gastrointestinal: Negative for abdominal pain.  Genitourinary: Negative for flank pain.  Musculoskeletal: Negative for back pain.  Skin: Negative for rash.  Neurological: Positive for weakness. Negative for seizures.  Hematological: Negative for adenopathy.  Psychiatric/Behavioral: Negative for confusion.     Physical Exam Updated Vital Signs BP (!) 161/48   Pulse (!) 59   Temp 98.5 F (36.9 C) (Oral)   Resp 17   SpO2 96%   Physical Exam  Constitutional: She appears well-developed.  HENT:  Head: Atraumatic.  Eyes: Pupils are equal, round, and reactive to light.  Neck: Neck supple.  Cardiovascular: Normal rate.   Pulmonary/Chest: She has rales.  Few scattered rales.  Abdominal: Soft.  Neurological:  Good grip strength bilaterally. Awake and appropriate. Can move all extremities. Somewhat decreased strength on straight leg raise.  Skin: Skin is warm. Capillary refill takes less than 2 seconds.  Psychiatric: She has a normal mood and affect.     ED Treatments / Results  Labs (all labs ordered are listed, but only abnormal results are displayed) Labs Reviewed  BASIC METABOLIC PANEL - Abnormal; Notable for the following:       Result Value   Chloride 96 (*)    Creatinine, Ser 4.59 (*)    GFR calc non Af Amer 9 (*)    GFR calc Af Amer 10 (*)    All other components within normal limits  CBC - Abnormal; Notable for  the following:    Hemoglobin 11.9 (*)    RDW 17.0 (*)    All other components within normal limits  TROPONIN I - Abnormal; Notable for the following:    Troponin I 0.03 (*)    All other components within normal limits  URINALYSIS, ROUTINE W REFLEX MICROSCOPIC  CBG MONITORING, ED    EKG  EKG Interpretation  Date/Time:  Monday December 22 2016 16:49:40 EDT Ventricular Rate:  63 PR Interval:  194 QRS Duration: 86 QT Interval:  444 QTC Calculation: 025 R Axis:   62 Text Interpretation:  Normal sinus rhythm Nonspecific ST abnormality Abnormal ECG Confirmed by Davonna Belling [phone]) on 12/22/2016 7:15:12 PM       Radiology Dg Chest 2 View  Result Date: 12/22/2016 CLINICAL DATA:  Two week history of generalized weakness and dizziness. Current history of end-stage renal disease on hemodialysis. EXAM: CHEST  2 VIEW COMPARISON:  12/06/2016, 04/19/2016 and earlier. FINDINGS: AP erect and lateral images were obtained. Cardiac silhouette mildly enlarged for technique, unchanged. Thoracic aorta atherosclerotic, unchanged. Hilar and mediastinal contours otherwise unremarkable. Stable linear Linear scarring in the inferior left upper lobe. Stable chronic elevation of the right hemidiaphragm with chronic scar/atelectasis involving the right lower lobe. No new pulmonary parenchymal abnormalities. No pleural effusions. Normal pulmonary vascularity. Degenerative changes involving the thoracic and upper lumbar spine. Left axillary endovascular dialysis device again noted with its tip in the right atrium, unchanged. IMPRESSION: 1.  No acute cardiopulmonary disease. 2. Stable mild cardiomegaly without pulmonary edema. 3. Stable chronic elevation of the right hemidiaphragm and chronic atelectasis/scar involving the right lower lobe. Stable  chronic scar involving the inferior left upper lobe. 4.  Aortic Atherosclerosis (ICD10-170.0) Electronically Signed   By: Evangeline Dakin M.D.   On: 12/22/2016 20:08     Procedures Procedures (including critical care time)  Medications Ordered in ED Medications - No data to display   Initial Impression / Assessment and Plan / ED Course  I have reviewed the triage vital signs and the nursing notes.  Pertinent labs & imaging results that were available during my care of the patient were reviewed by me and considered in my medical decision making (see chart for details).     Patient with end-stage renal disease on dialysis. Has been home for last 2 weeks after pneumonia. Has been unable to walk and really care for self. Worse last couple days. Able to dialyze today. Labs overall relatively reassuring. Will admit for further evaluation and therapy evaluation.  Final Clinical Impressions(s) / ED Diagnoses   Final diagnoses:  Generalized weakness  End stage renal disease on dialysis [facility])    New Prescriptions New Prescriptions   No medications on file     Davonna Belling, MD 12/22/16 2211

## 2016-12-22 NOTE — ED Notes (Addendum)
Pt's son name is Nicole Kindred: [phone]

## 2016-12-23 DIAGNOSIS — N186 End stage renal disease: Secondary | ICD-10-CM

## 2016-12-23 DIAGNOSIS — I160 Hypertensive urgency: Secondary | ICD-10-CM

## 2016-12-23 DIAGNOSIS — Z992 Dependence on renal dialysis: Secondary | ICD-10-CM

## 2016-12-23 DIAGNOSIS — R531 Weakness: Secondary | ICD-10-CM | POA: Diagnosis not present

## 2016-12-23 DIAGNOSIS — R5383 Other fatigue: Secondary | ICD-10-CM

## 2016-12-23 LAB — TROPONIN I
TROPONIN I: 0.03 ng/mL — AB (ref <0.03)
Troponin I: 0.03 ng/mL (ref <0.03)
Troponin I: 0.03 ng/mL (ref <0.03)

## 2016-12-23 LAB — CBC
HEMATOCRIT: 33.3 % — AB (ref 36.0–46.0)
Hemoglobin: 10.8 g/dL — ABNORMAL LOW (ref 12.0–15.0)
MCH: 28.6 pg (ref 26.0–34.0)
MCHC: 32.4 g/dL (ref 30.0–36.0)
MCV: 88.1 fL (ref 78.0–100.0)
Platelets: 133 10*3/uL — ABNORMAL LOW (ref 150–400)
RBC: 3.78 MIL/uL — AB (ref 3.87–5.11)
RDW: 17 % — ABNORMAL HIGH (ref 11.5–15.5)
WBC: 8.5 10*3/uL (ref 4.0–10.5)

## 2016-12-23 LAB — BASIC METABOLIC PANEL
Anion gap: 9 (ref 5–15)
BUN: 19 mg/dL (ref 6–20)
CHLORIDE: 97 mmol/L — AB (ref 101–111)
CO2: 31 mmol/L (ref 22–32)
Calcium: 9.4 mg/dL (ref 8.9–10.3)
Creatinine, Ser: 5.96 mg/dL — ABNORMAL HIGH (ref 0.44–1.00)
GFR calc non Af Amer: 6 mL/min — ABNORMAL LOW (ref >60)
GFR, EST AFRICAN AMERICAN: 7 mL/min — AB (ref >60)
Glucose, Bld: 80 mg/dL (ref 65–99)
POTASSIUM: 3.8 mmol/L (ref 3.5–5.1)
SODIUM: 137 mmol/L (ref 135–145)

## 2016-12-23 LAB — CBG MONITORING, ED: GLUCOSE-CAPILLARY: 153 mg/dL — AB (ref 65–99)

## 2016-12-23 NOTE — ED Notes (Signed)
Pt ambulated with PT in hallway

## 2016-12-23 NOTE — ED Notes (Signed)
Dr. Eliseo Squires at bedside, plans to discharge patient with home health care.

## 2016-12-23 NOTE — ED Notes (Signed)
Multiple attempts made by RN's and IV team with no success, pt refusing to have access at this time.

## 2016-12-23 NOTE — Discharge Summary (Signed)
Physician Discharge Summary  [name] DGU:[id number] DOB: 1941/05/25 DOA: 12/22/2016  PCP: Martinique, Betty G, MD  Admit date: 12/22/2016 Discharge date: 12/23/2016   Recommendations for Outpatient Follow-Up:   1. Home health PT re-ordered 2. ?need for tighter BP control 3. ?depression/anxiety- change meds?   Discharge Diagnosis:   Principal Problem:   Generalized weakness Active Problems:   HYPERCHOLESTEROLEMIA   TOBACCO ABUSE   Major depression in partial remission (HCC)   Hypertensive urgency   ESRD on dialysis (HCC)   Chronic obstructive pulmonary disease (HCC)   End stage renal disease (HCC)   Elevated troponin   Weakness   Discharge disposition:  Home.  Discharge Condition: Improved.  Diet recommendation: renal diet  Wound care: None.   History of Present Illness:   [name] is a 76 y.o. female with medical history significant of hypertension, hyperlipidemia, TIA, GERD, gout, depression, tobacco abuse, chronic mild elevation of troponin, seizure, IBS, ESRD-HD (MWF), adrenal mass, abnormal gait, chronic back pain, who presents with generalized weakness.  Patient was recently hospitalized from 7/7-7/9 due to HCAP. Patient was discharged and completed a course of antibiotic treatment. Patient does not have cough, shortness of breath and chest pain today. She states that she has been feeling weak for one week. She has generalized weakness, cannot walk even with a walker. Patient states that she had dizziness in the early morning, which has resolved after complete to dialysis today. She does not have unilateral numbness, tingliness in extremities. No vision change, hearing loss, slurred speech, facial droop. Patient denies nausea, vomiting, diarrhea, abdominal pain. She states that occasionally she has dysuria on urination. States her primary care doctor has arranged for physical therapy at home, but will not be there until Thursday. She had dialysis  today.   Hospital Course by Problem:   Generalized weakness: Patient has generalized weakness. No focal neurological findings on physical examination, less likely to have stroke. Most likely due to generalized deconditioning due to multiple comorbidities. - seen by PT--home health (already arranged by PCP)  Hypertensive urgency: bp 212/63-->161/48. -Continue amlodipine, clonidine, oral hydralazine, Cozaar and metoprolol -defer to nephrology Dr. Loletha Grayer to change meds-- not convinced patient is taking meds consistantly  Depression: Stable, no suicidal or homicidal ideations. -Continue home medications: Cymbalta- defer to PCP for changes -? Anxiety-- takes care of disabled family member who is having surgery today  HLD: -Lipitor  Tobacco abuse: -Did counseling about importance of quitting smoking -Nicotine patch  COPD: stable -prn albuterol nebulizer -Atrovent  ESRD on dialysis ([facility]): -continue Sensipar -HD due again in the AM  GERD:  -Protonix  Chronically elevated troponin:  Patient does not have chest pain. Likely due to decreased clearance secondary to ESRD -continue ASA and lipitor    Medical Consultants:   PT  Discharge Exam:   Vitals:   12/23/16 1130 12/23/16 1219  BP: (!) 161/58 (!) 146/46  Pulse: 60 61  Resp: 12 (!) 24  Temp:     Vitals:   12/23/16 0945 12/23/16 1101 12/23/16 1130 12/23/16 1219  BP: (!) 171/55 (!) 153/54 (!) 161/58 (!) 146/46  Pulse: (!) 59 (!) 57 60 61  Resp: 16 14 12  (!) 24  Temp:      TempSrc:      SpO2: 97% 98% 98% 99%    Gen:  Flat affect  The results of significant diagnostics from this hospitalization (including imaging, microbiology, ancillary and laboratory) are listed below for reference.     Procedures and  Diagnostic Studies:   Dg Chest 2 View  Result Date: 12/22/2016 CLINICAL DATA:  Two week history of generalized weakness and dizziness. Current history of end-stage renal disease on hemodialysis. EXAM: CHEST   2 VIEW COMPARISON:  12/06/2016, 04/19/2016 and earlier. FINDINGS: AP erect and lateral images were obtained. Cardiac silhouette mildly enlarged for technique, unchanged. Thoracic aorta atherosclerotic, unchanged. Hilar and mediastinal contours otherwise unremarkable. Stable linear Linear scarring in the inferior left upper lobe. Stable chronic elevation of the right hemidiaphragm with chronic scar/atelectasis involving the right lower lobe. No new pulmonary parenchymal abnormalities. No pleural effusions. Normal pulmonary vascularity. Degenerative changes involving the thoracic and upper lumbar spine. Left axillary endovascular dialysis device again noted with its tip in the right atrium, unchanged. IMPRESSION: 1.  No acute cardiopulmonary disease. 2. Stable mild cardiomegaly without pulmonary edema. 3. Stable chronic elevation of the right hemidiaphragm and chronic atelectasis/scar involving the right lower lobe. Stable chronic scar involving the inferior left upper lobe. 4.  Aortic Atherosclerosis (ICD10-170.0) Electronically Signed   By: Evangeline Dakin M.D.   On: 12/22/2016 20:08     Labs:   Basic Metabolic Panel:  Recent Labs Lab 12/22/16 1708 12/23/16 0451  NA 135 137  K 3.6 3.8  CL 96* 97*  CO2 29 31  GLUCOSE 81 80  BUN 12 19  CREATININE 4.59* 5.96*  CALCIUM 9.2 9.4   GFR Estimated Creatinine Clearance: 7.8 mL/min (A) (by C-G formula based on SCr of 5.96 mg/dL (H)). Liver Function Tests: No results for input(s): AST, ALT, ALKPHOS, BILITOT, PROT, ALBUMIN in the last 168 hours. No results for input(s): LIPASE, AMYLASE in the last 168 hours. No results for input(s): AMMONIA in the last 168 hours. Coagulation profile No results for input(s): INR, PROTIME in the last 168 hours.  CBC:  Recent Labs Lab 12/22/16 1708 12/23/16 0451  WBC 7.7 8.5  HGB 11.9* 10.8*  HCT 36.6 33.3*  MCV 88.0 88.1  PLT 150 133*   Cardiac Enzymes:  Recent Labs Lab 12/22/16 1708 12/23/16 0152  12/23/16 0451  TROPONINI 0.03* 0.03* 0.03*   BNP: Invalid input(s): POCBNP CBG:  Recent Labs Lab 12/23/16 0827  GLUCAP 153*   D-Dimer No results for input(s): DDIMER in the last 72 hours. Hgb A1c No results for input(s): HGBA1C in the last 72 hours. Lipid Profile No results for input(s): CHOL, HDL, LDLCALC, TRIG, CHOLHDL, LDLDIRECT in the last 72 hours. Thyroid function studies No results for input(s): TSH, T4TOTAL, T3FREE, THYROIDAB in the last 72 hours.  Invalid input(s): FREET3 Anemia work up No results for input(s): VITAMINB12, FOLATE, FERRITIN, TIBC, IRON, RETICCTPCT in the last 72 hours. Microbiology No results found for this or any previous visit (from the past 240 hour(s)).   Discharge Instructions:   Discharge Instructions    Discharge instructions    Complete by:  As directed    Renal diet Smoking cessation Home health   Increase activity slowly    Complete by:  As directed      Allergies as of 12/23/2016      Reactions   Tuberculin Tests Hives   "blisters"   Valacyclovir Other (See Comments)   Confusion and nervousness   Codeine Nausea And Vomiting   Penicillins Rash   No problems breathing. Has tolerated omnicef in past without issue Has patient had a PCN reaction causing immediate rash, facial/tongue/throat swelling, SOB or lightheadedness with hypotension: Yes Has patient had a PCN reaction causing severe rash involving mucus membranes or skin necrosis: No  Has patient had a PCN reaction that required hospitalization No Has patient had a PCN reaction occurring within the last 10 years: No If all of the above answers are "NO", then may proceed with Cephalosporin   Sulfamethoxazole Rash      Medication List    TAKE these medications   acetaminophen 500 MG tablet Commonly known as:  TYLENOL Take 1,000 mg by mouth every 6 (six) hours as needed for mild pain or moderate pain.   albuterol 108 (90 Base) MCG/ACT inhaler Commonly known as:   PROVENTIL HFA;VENTOLIN HFA Inhale 2 puffs into the lungs every 4 (four) hours as needed for wheezing or shortness of breath.   allopurinol 100 MG tablet Commonly known as:  ZYLOPRIM Take 100 mg by mouth once daily on non-dialysis days (Tues/Thurs/Sat/Sun) What changed:  how much to take  how to take this  when to take this  additional instructions   amLODipine 10 MG tablet Commonly known as:  NORVASC Take 1 tablet (10 mg total) by mouth at bedtime.   aspirin EC 81 MG tablet Take 81 mg by mouth daily.   atorvastatin 40 MG tablet Commonly known as:  LIPITOR Take 1 tablet (40 mg total) by mouth daily. What changed:  when to take this   azelastine 0.1 % nasal spray Commonly known as:  ASTELIN Place 1 spray into both nostrils 2 (two) times daily. Use in each nostril as directed What changed:  when to take this  reasons to take this  additional instructions   cinacalcet 90 MG tablet Commonly known as:  SENSIPAR Take 90 mg by mouth at bedtime.   cloNIDine 0.2 MG tablet Commonly known as:  CATAPRES Take 1 tablet (0.2 mg total) by mouth 2 (two) times daily.   DULoxetine 30 MG capsule Commonly known as:  CYMBALTA Take 2 capsules (60 mg total) by mouth daily. What changed:  when to take this   fluticasone 50 MCG/ACT nasal spray Commonly known as:  FLONASE Place 2 sprays into both nostrils daily as needed for allergies or rhinitis.   hydrALAZINE 10 MG tablet Commonly known as:  APRESOLINE Take 1 tablet (10 mg total) by mouth 2 (two) times daily.   ipratropium 17 MCG/ACT inhaler Commonly known as:  ATROVENT HFA Inhale 2 puffs into the lungs every 4 (four) hours as needed for wheezing.   LEG CRAMP RELIEF PO Take 1-2 tablets by mouth daily as needed (for leg pain).   lidocaine-prilocaine cream Commonly known as:  EMLA Apply 1 application topically See admin instructions. Apply topically one hour before dialysis - Monday, Wednesday, Friday   loratadine 10 MG  tablet Commonly known as:  CLARITIN Take 1 tablet (10 mg total) by mouth daily as needed for allergies.   losartan 100 MG tablet Commonly known as:  COZAAR Take 1 tablet (100 mg total) by mouth daily.   metoprolol succinate 100 MG 24 hr tablet Commonly known as:  TOPROL-XL Take 150 mg by mouth at bedtime.   multivitamin Tabs tablet Take 1 tablet by mouth at bedtime. What changed:  when to take this   omeprazole 40 MG capsule Commonly known as:  PRILOSEC Take 1 capsule (40 mg total) by mouth 2 (two) times daily.   pregabalin 100 MG capsule Commonly known as:  LYRICA Take 100 mg by mouth at bedtime.   sevelamer carbonate 800 MG tablet Commonly known as:  RENVELA Take 2,400 mg by mouth See admin instructions. Take 3 tablets (2400 mg) by mouth with  each meal - 4 times daily      Follow-up Information    Martinique, Betty G, MD Follow up.   Specialty:  Family Medicine Why:  consider BP medication addition Contact information: [address] [place] [address] [phone]            Time coordinating discharge: 35 min  Signed:  JESSICA Alison Stalling   [facility] 12/23/2016, 12:31 PM

## 2016-12-23 NOTE — ED Notes (Signed)
Pt eating breakfast tray 

## 2016-12-23 NOTE — Evaluation (Signed)
Physical Therapy Evaluation Patient Details Name: [name] MRN: [id number] DOB: July 19, 1940 Today's Date: 12/23/2016   History of Present Illness  75 yo in ED for weakness bil LE with inability to walk. PMHx: ESRD, HTn, COPD, GERD, HLD CHF, gout  Clinical Impression  Pt pleasant, moving well and reports walking and transfers today are better then they have been of late. Pt reports HHPT is scheduled to start in 2 days which remains an appropriate plan. Pt did not require physical assist for transfers or gait with use of RW today and demonstrates decreased strength, gait and function who will benefit from acute therapy to maximize independence, gait speed and function.     Follow Up Recommendations Home health PT    Equipment Recommendations  None recommended by PT    Recommendations for Other Services       Precautions / Restrictions Precautions Precautions: Fall      Mobility  Bed Mobility Overal bed mobility: Modified Independent                Transfers Overall transfer level: Modified independent                  Ambulation/Gait Ambulation/Gait assistance: Min guard Ambulation Distance (Feet): 80 Feet Assistive device: Rolling walker (2 wheeled) Gait Pattern/deviations: Step-through pattern;Decreased stride length   Gait velocity interpretation: <1.8 ft/sec, indicative of risk for recurrent falls General Gait Details: cues for position in RW and self-regulation. pt limited by fatigue and reports pain in hips radiates to thighs with increased distance  Stairs            Wheelchair Mobility    Modified Rankin (Stroke Patients Only)       Balance Overall balance assessment: Needs assistance   Sitting balance-Leahy Scale: Good       Standing balance-Leahy Scale: Poor                               Pertinent Vitals/Pain Pain Assessment: 0-10 Pain Score: 6  Pain Location: left hip Pain Descriptors / Indicators:  Aching Pain Intervention(s): Limited activity within patient's tolerance;Monitored during session;Repositioned    Home Living Family/patient expects to be discharged to:: Private residence Living Arrangements: Children Available Help at Discharge: Family;Available PRN/intermittently Type of Home: House Home Access: Level entry     Home Layout: One level Home Equipment: Walker - 4 wheels;Cane - single point;Shower seat;Grab bars - toilet Additional Comments: lives with daughter who works    Prior Function Level of Independence: Independent with assistive device(s)         Comments: has a walker and cane but doesn't normally use them, volunteers     Hand Dominance        Extremity/Trunk Assessment   Upper Extremity Assessment Upper Extremity Assessment: Overall WFL for tasks assessed    Lower Extremity Assessment Lower Extremity Assessment: Overall WFL for tasks assessed;RLE deficits/detail;LLE deficits/detail RLE Deficits / Details: hip flexion 4/5, knee extension 4/5 LLE Deficits / Details: hip flexion 4/5, knee extension 4/5    Cervical / Trunk Assessment Cervical / Trunk Assessment: Kyphotic  Communication   Communication: No difficulties  Cognition Arousal/Alertness: Awake/alert Behavior During Therapy: WFL for tasks assessed/performed Overall Cognitive Status: Within Functional Limits for tasks assessed  General Comments      Exercises     Assessment/Plan    PT Assessment Patient needs continued PT services  PT Problem List Decreased strength;Decreased activity tolerance;Decreased mobility;Decreased knowledge of use of DME;Pain       PT Treatment Interventions Gait training;DME instruction;Therapeutic exercise;Functional mobility training;Therapeutic activities;Patient/family education    PT Goals (Current goals can be found in the Care Plan section)  Acute Rehab PT Goals Patient Stated Goal:  return home PT Goal Formulation: With patient Time For Goal Achievement: 12/30/16 Potential to Achieve Goals: Good    Frequency Min 3X/week   Barriers to discharge Decreased caregiver support      Co-evaluation               AM-PAC PT "6 Clicks" Daily Activity  Outcome Measure Difficulty turning over in bed (including adjusting bedclothes, sheets and blankets)?: A Little Difficulty moving from lying on back to sitting on the side of the bed? : A Little Difficulty sitting down on and standing up from a chair with arms (e.g., wheelchair, bedside commode, etc,.)?: A Little Help needed moving to and from a bed to chair (including a wheelchair)?: None Help needed walking in hospital room?: A Little Help needed climbing 3-5 steps with a railing? : A Little 6 Click Score: 19    End of Session Equipment Utilized During Treatment: Gait belt Activity Tolerance: Patient tolerated treatment well Patient left: in bed;with call bell/phone within reach Nurse Communication: Mobility status PT Visit Diagnosis: Other abnormalities of gait and mobility (R26.89);Muscle weakness (generalized) (M62.81);Difficulty in walking, not elsewhere classified (R26.2)    Time: 3300-7622 PT Time Calculation (min) (ACUTE ONLY): 15 min   Charges:   PT Evaluation $PT Eval Moderate Complexity: 1 Procedure     PT G Codes:   PT G-Codes **NOT FOR INPATIENT CLASS** Functional Assessment Tool Used: AM-PAC 6 Clicks Basic Mobility Functional Limitation: Mobility: Walking and moving around Mobility: Walking and Moving Around Current Status (Q3335): At least 20 percent but less than 40 percent impaired, limited or restricted Mobility: Walking and Moving Around Goal Status [phone]): At least 1 percent but less than 20 percent impaired, limited or restricted    Elwyn Reach, [place]   Sandy Salaam Rhea Kaelin 12/23/2016, 11:55 AM

## 2016-12-23 NOTE — Care Management (Signed)
[place] referral faxed to [facility] agency where patient active currently. Information placed on AVS

## 2016-12-23 NOTE — ED Notes (Signed)
Pt eating lunch

## 2016-12-24 DIAGNOSIS — D631 Anemia in chronic kidney disease: Secondary | ICD-10-CM | POA: Diagnosis not present

## 2016-12-24 DIAGNOSIS — N2581 Secondary hyperparathyroidism of renal origin: Secondary | ICD-10-CM | POA: Diagnosis not present

## 2016-12-24 DIAGNOSIS — N186 End stage renal disease: Secondary | ICD-10-CM | POA: Diagnosis not present

## 2016-12-25 DIAGNOSIS — R2681 Unsteadiness on feet: Secondary | ICD-10-CM | POA: Diagnosis not present

## 2016-12-25 DIAGNOSIS — Z8701 Personal history of pneumonia (recurrent): Secondary | ICD-10-CM | POA: Diagnosis not present

## 2016-12-25 DIAGNOSIS — I132 Hypertensive heart and chronic kidney disease with heart failure and with stage 5 chronic kidney disease, or end stage renal disease: Secondary | ICD-10-CM | POA: Diagnosis not present

## 2016-12-25 DIAGNOSIS — I5030 Unspecified diastolic (congestive) heart failure: Secondary | ICD-10-CM | POA: Diagnosis not present

## 2016-12-25 DIAGNOSIS — F17210 Nicotine dependence, cigarettes, uncomplicated: Secondary | ICD-10-CM | POA: Diagnosis not present

## 2016-12-25 DIAGNOSIS — D631 Anemia in chronic kidney disease: Secondary | ICD-10-CM | POA: Diagnosis not present

## 2016-12-25 DIAGNOSIS — Z7982 Long term (current) use of aspirin: Secondary | ICD-10-CM | POA: Diagnosis not present

## 2016-12-25 DIAGNOSIS — Z8673 Personal history of transient ischemic attack (TIA), and cerebral infarction without residual deficits: Secondary | ICD-10-CM | POA: Diagnosis not present

## 2016-12-25 DIAGNOSIS — F329 Major depressive disorder, single episode, unspecified: Secondary | ICD-10-CM | POA: Diagnosis not present

## 2016-12-25 DIAGNOSIS — Z992 Dependence on renal dialysis: Secondary | ICD-10-CM | POA: Diagnosis not present

## 2016-12-25 DIAGNOSIS — J449 Chronic obstructive pulmonary disease, unspecified: Secondary | ICD-10-CM | POA: Diagnosis not present

## 2016-12-25 DIAGNOSIS — N186 End stage renal disease: Secondary | ICD-10-CM | POA: Diagnosis not present

## 2016-12-26 DIAGNOSIS — D631 Anemia in chronic kidney disease: Secondary | ICD-10-CM | POA: Diagnosis not present

## 2016-12-26 DIAGNOSIS — N2581 Secondary hyperparathyroidism of renal origin: Secondary | ICD-10-CM | POA: Diagnosis not present

## 2016-12-26 DIAGNOSIS — N186 End stage renal disease: Secondary | ICD-10-CM | POA: Diagnosis not present

## 2016-12-29 DIAGNOSIS — N2581 Secondary hyperparathyroidism of renal origin: Secondary | ICD-10-CM | POA: Diagnosis not present

## 2016-12-29 DIAGNOSIS — D631 Anemia in chronic kidney disease: Secondary | ICD-10-CM | POA: Diagnosis not present

## 2016-12-29 DIAGNOSIS — N186 End stage renal disease: Secondary | ICD-10-CM | POA: Diagnosis not present

## 2016-12-30 ENCOUNTER — Ambulatory Visit: Payer: Medicare Other | Admitting: Family Medicine

## 2016-12-30 DIAGNOSIS — Z992 Dependence on renal dialysis: Secondary | ICD-10-CM | POA: Diagnosis not present

## 2016-12-30 DIAGNOSIS — E1129 Type 2 diabetes mellitus with other diabetic kidney complication: Secondary | ICD-10-CM | POA: Diagnosis not present

## 2016-12-30 DIAGNOSIS — N186 End stage renal disease: Secondary | ICD-10-CM | POA: Diagnosis not present

## 2016-12-31 DIAGNOSIS — Z23 Encounter for immunization: Secondary | ICD-10-CM | POA: Diagnosis not present

## 2016-12-31 DIAGNOSIS — N2581 Secondary hyperparathyroidism of renal origin: Secondary | ICD-10-CM | POA: Diagnosis not present

## 2016-12-31 DIAGNOSIS — D631 Anemia in chronic kidney disease: Secondary | ICD-10-CM | POA: Diagnosis not present

## 2016-12-31 DIAGNOSIS — Z283 Underimmunization status: Secondary | ICD-10-CM | POA: Diagnosis not present

## 2016-12-31 DIAGNOSIS — N186 End stage renal disease: Secondary | ICD-10-CM | POA: Diagnosis not present

## 2017-01-01 ENCOUNTER — Telehealth: Payer: Self-pay | Admitting: Neurology

## 2017-01-01 DIAGNOSIS — R2681 Unsteadiness on feet: Secondary | ICD-10-CM | POA: Diagnosis not present

## 2017-01-01 DIAGNOSIS — J449 Chronic obstructive pulmonary disease, unspecified: Secondary | ICD-10-CM | POA: Diagnosis not present

## 2017-01-01 DIAGNOSIS — I132 Hypertensive heart and chronic kidney disease with heart failure and with stage 5 chronic kidney disease, or end stage renal disease: Secondary | ICD-10-CM | POA: Diagnosis not present

## 2017-01-01 DIAGNOSIS — I5030 Unspecified diastolic (congestive) heart failure: Secondary | ICD-10-CM | POA: Diagnosis not present

## 2017-01-01 DIAGNOSIS — N186 End stage renal disease: Secondary | ICD-10-CM | POA: Diagnosis not present

## 2017-01-01 DIAGNOSIS — Z8701 Personal history of pneumonia (recurrent): Secondary | ICD-10-CM | POA: Diagnosis not present

## 2017-01-01 NOTE — Telephone Encounter (Addendum)
[facility]anon [facility] [phone] called the office said the pt had an epidural last month for pain in bil hips and back. He said the pt noticed that the epidural was more painful this time than times prior, then unexpectedly got pneumonia and had to be hospitalized for 2 days. When they let her get up to leave her legs are weak, buckling, muscle spasms, she can walk about 25-30 feet but not confidently. Prior to this she was involved in community service and very active. He feels she needs to be seen to evaluate for neurological disorder. Her vital signs are all normal, no fever. Please call

## 2017-01-01 NOTE — Telephone Encounter (Signed)
I will need to see this patient in the office for an evaluation.  It is possible the patient could have developed a subdural or epidural hematoma, we'll need to see the patient for further evaluation.

## 2017-01-01 NOTE — Telephone Encounter (Signed)
Dr Jannifer Franklin- please advise. Patient last saw MM,NP. Has appt on 05/19/17 to see Megan for her 6 month f/u

## 2017-01-02 ENCOUNTER — Telehealth: Payer: Self-pay

## 2017-01-02 DIAGNOSIS — Z23 Encounter for immunization: Secondary | ICD-10-CM | POA: Diagnosis not present

## 2017-01-02 DIAGNOSIS — Z283 Underimmunization status: Secondary | ICD-10-CM | POA: Diagnosis not present

## 2017-01-02 DIAGNOSIS — N2581 Secondary hyperparathyroidism of renal origin: Secondary | ICD-10-CM | POA: Diagnosis not present

## 2017-01-02 DIAGNOSIS — N186 End stage renal disease: Secondary | ICD-10-CM | POA: Diagnosis not present

## 2017-01-02 DIAGNOSIS — D631 Anemia in chronic kidney disease: Secondary | ICD-10-CM | POA: Diagnosis not present

## 2017-01-02 NOTE — Telephone Encounter (Signed)
Ok to give verbal authorization for PT request. Thanks, BJ

## 2017-01-02 NOTE — Telephone Encounter (Signed)
LMTCB

## 2017-01-02 NOTE — Telephone Encounter (Signed)
Robert @ [facility] called to request verbal approval of order for PT 2xwk/6wks to work on gait, LE strengthening and balance.  Dr. Martinique - Please advise. Thanks!

## 2017-01-02 NOTE — Telephone Encounter (Signed)
Called and spoke with patient. Scheduled appt for 01/08/17 at 12pm, check in 1130am. Patient has dialysis on Monday, Wednesday, Friday. Could not do these days. Offered today at 11am, pt declined.

## 2017-01-05 DIAGNOSIS — Z23 Encounter for immunization: Secondary | ICD-10-CM | POA: Diagnosis not present

## 2017-01-05 DIAGNOSIS — Z283 Underimmunization status: Secondary | ICD-10-CM | POA: Diagnosis not present

## 2017-01-05 DIAGNOSIS — N2581 Secondary hyperparathyroidism of renal origin: Secondary | ICD-10-CM | POA: Diagnosis not present

## 2017-01-05 DIAGNOSIS — D631 Anemia in chronic kidney disease: Secondary | ICD-10-CM | POA: Diagnosis not present

## 2017-01-05 DIAGNOSIS — N186 End stage renal disease: Secondary | ICD-10-CM | POA: Diagnosis not present

## 2017-01-05 NOTE — Telephone Encounter (Signed)
LMTCB

## 2017-01-06 DIAGNOSIS — I5030 Unspecified diastolic (congestive) heart failure: Secondary | ICD-10-CM | POA: Diagnosis not present

## 2017-01-06 DIAGNOSIS — R2681 Unsteadiness on feet: Secondary | ICD-10-CM | POA: Diagnosis not present

## 2017-01-06 DIAGNOSIS — Z8701 Personal history of pneumonia (recurrent): Secondary | ICD-10-CM | POA: Diagnosis not present

## 2017-01-06 DIAGNOSIS — I132 Hypertensive heart and chronic kidney disease with heart failure and with stage 5 chronic kidney disease, or end stage renal disease: Secondary | ICD-10-CM | POA: Diagnosis not present

## 2017-01-06 DIAGNOSIS — J449 Chronic obstructive pulmonary disease, unspecified: Secondary | ICD-10-CM | POA: Diagnosis not present

## 2017-01-06 DIAGNOSIS — N186 End stage renal disease: Secondary | ICD-10-CM | POA: Diagnosis not present

## 2017-01-06 NOTE — Telephone Encounter (Signed)
LMTCB

## 2017-01-06 NOTE — Telephone Encounter (Signed)
Spoke with [name] @ [place] and gave verbal auth for PT orders. Nothing further needed.

## 2017-01-07 DIAGNOSIS — N186 End stage renal disease: Secondary | ICD-10-CM | POA: Diagnosis not present

## 2017-01-07 DIAGNOSIS — Z23 Encounter for immunization: Secondary | ICD-10-CM | POA: Diagnosis not present

## 2017-01-07 DIAGNOSIS — Z283 Underimmunization status: Secondary | ICD-10-CM | POA: Diagnosis not present

## 2017-01-07 DIAGNOSIS — N2581 Secondary hyperparathyroidism of renal origin: Secondary | ICD-10-CM | POA: Diagnosis not present

## 2017-01-07 DIAGNOSIS — D631 Anemia in chronic kidney disease: Secondary | ICD-10-CM | POA: Diagnosis not present

## 2017-01-08 ENCOUNTER — Ambulatory Visit (INDEPENDENT_AMBULATORY_CARE_PROVIDER_SITE_OTHER): Payer: Medicare Other | Admitting: Neurology

## 2017-01-08 ENCOUNTER — Encounter: Payer: Self-pay | Admitting: Neurology

## 2017-01-08 ENCOUNTER — Telehealth: Payer: Self-pay | Admitting: Neurology

## 2017-01-08 VITALS — BP 181/67 | HR 65 | Ht 61.0 in | Wt 179.0 lb

## 2017-01-08 DIAGNOSIS — M545 Low back pain: Secondary | ICD-10-CM

## 2017-01-08 DIAGNOSIS — R531 Weakness: Secondary | ICD-10-CM | POA: Diagnosis not present

## 2017-01-08 DIAGNOSIS — G8929 Other chronic pain: Secondary | ICD-10-CM | POA: Diagnosis not present

## 2017-01-08 MED ORDER — TRAMADOL HCL 50 MG PO TABS: 50.0000 mg | ORAL_TABLET | Freq: Four times a day (QID) | ORAL | 1 refills | Status: DC | PRN

## 2017-01-08 MED ORDER — DULOXETINE HCL 30 MG PO CPEP: 60.0000 mg | ORAL_CAPSULE | Freq: Every day | ORAL | 3 refills | Status: AC

## 2017-01-08 NOTE — Telephone Encounter (Signed)
Noted, thank you

## 2017-01-08 NOTE — Progress Notes (Signed)
Reason for visit: Leg weakness  [name] is an 76 y.o. female  History of present illness:  Ms. Swinson is a 76 year old right-handed black female with a history of end-stage renal disease on hemodialysis, she has a history of chronic low back pain and peripheral neuropathy associated with a gait disorder. The patient has undergone an epidural steroid injection that occurred around 11/19/2016. The patient went into the hospital around 12/06/2016 with a pneumonia. Since that time, she has not been able to walk well with some reports of weakness of the legs, she feels that the left leg is weaker than the right. At nighttime, she may have jerking and twitching of the legs that keep her awake. She is on Lyrica taking 100 mg at night. She takes Cymbalta which was recently increased from 60 to 120 mg daily. The patient denies any pain in the back or down the legs, she did have some discomfort with the epidural injection during the procedure. The patient has not noted any change in the way the bowels or the bladder are working. She does not believe there has been any progress in her ability to walk since onset, she now is getting physical therapy. The patient went to the hospital on 12/22/2016 because of the leg weakness, she spent several days in the hospital, it was felt that she was deconditioned from the pneumonia. She comes to the office today for further evaluation. She denies any dizziness when she stands, she feels as if the legs will collapse, however. Her blood pressure medication, clonidine was recently increased to 0.3 mg 3 times daily.  Past Medical History:  Diagnosis Date  . Abnormality of gait 03/28/2015  . Adrenal mass ([place])   . 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 Arthritis   . Back pain   . CHF (congestive heart failure) ([place])   . Chronic kidney disease    Hemo MWF  . Congestion of throat    Pt states she has a lot mucus in back throat.   . Constipation   . COPD (chronic obstructive pulmonary disease) ([place])   . Depression   . Dialysis patient [facility])    kidney  . Diverticulitis   . GERD (gastroesophageal reflux disease)   . H/O hiatal hernia   . High cholesterol   . Hoarseness of voice   . Hyperlipidemia   . Hypertension   . IBS (irritable bowel syndrome)   . Meralgia paresthetica of right side 12/26/2014  . Normal cardiac stress test 12/24/2009   lexiscan, imaging normal  . Renal disorder   . RLS (restless legs syndrome)   . Seizures ([place])    2004 past brain surgery  . Sinus complaint   . Thyroid disease   . TIA (transient ischemic attack)   . Tubular adenoma of colon 01/2008    Past Surgical History:  Procedure Laterality Date  . ABDOMINAL HYSTERECTOMY    . ANAL RECTAL MANOMETRY N/A 07/25/2015   Procedure: ANO RECTAL MANOMETRY;  Surgeon: Mauri Pole, MD;  Location: [facility];  Service: Endoscopy;  Laterality: N/A;  . APPENDECTOMY    . AV FISTULA PLACEMENT Left 11/11/2012   Procedure: INSERTION OF ARTERIOVENOUS (AV) GORE-TEX GRAFT ARM;  Surgeon: Angelia Mould, MD;  Location: [place];  Service: Vascular;  Laterality: Left;  . [place] REMOVAL Left 11/18/2012   Procedure: REMOVAL OF LEFT UPPER ARM ARTERIOVENOUS GORETEX GRAFT ([place]);  Surgeon: Harrell Gave  Nicole Cella, MD;  Location: [place];  Service: Vascular;  Laterality: Left;  . CARDIAC CATHETERIZATION  2003   normal  . CHOLECYSTECTOMY     Open mid line incision  . frontal craniotomy  2002   indication = sinusitis  . INSERTION OF DIALYSIS CATHETER     Left  . PATCH ANGIOPLASTY Left 11/18/2012   Procedure: PATCH ANGIOPLASTY;  Surgeon: Angelia Mould, MD;  Location: [place];  Service: Vascular;  Laterality: Left;  . PERIPHERAL VASCULAR CATHETERIZATION Left 03/08/2015   Procedure: A/V Shuntogram/Fistulagram;  Surgeon: Algernon Huxley, MD;  Location: [place] [facility];  Service: Cardiovascular;  Laterality: Left;  . PERIPHERAL VASCULAR CATHETERIZATION  Left 03/08/2015   Procedure: A/V Shunt Intervention;  Surgeon: Algernon Huxley, MD;  Location: [place] [facility];  Service: Cardiovascular;  Laterality: Left;  . RECTAL ULTRASOUND N/A 10/05/2015   Procedure: ANAL ULTRASOUND WITH PROBE;  Surgeon: Leighton Ruff, MD;  Location: [facility];  Service: Endoscopy;  Laterality: N/A;  . REVISON OF ARTERIOVENOUS FISTULA  05/11/2012   Procedure: REVISON OF ARTERIOVENOUS FISTULA;  Surgeon: Elam Dutch, MD;  Location: [place];  Service: Vascular;  Laterality: Right;  . TUBAL LIGATION      Family History  Problem Relation Age of Onset  . Thyroid cancer Mother   . Heart disease Father   . Hypertension Father   . Heart attack Father   . Lupus Daughter   . Breast cancer Sister   . Thyroid cancer Sister   . Kidney disease Sister   . Colon cancer Neg Hx     Social history:  reports that she has been smoking Cigarettes.  She has a 60.00 pack-year smoking history. She has never used smokeless tobacco. She reports that she does not drink alcohol or use drugs.    Allergies  Allergen Reactions  . Tuberculin Tests Hives    "blisters"  . Valacyclovir Other (See Comments)    Confusion and nervousness  . Codeine Nausea And Vomiting  . Penicillins Rash    No problems breathing. Has tolerated omnicef in past without issue Has patient had a PCN reaction causing immediate rash, facial/tongue/throat swelling, SOB or lightheadedness with hypotension: Yes Has patient had a PCN reaction causing severe rash involving mucus membranes or skin necrosis: No Has patient had a PCN reaction that required hospitalization No Has patient had a PCN reaction occurring within the last 10 years: No If all of the above answers are "NO", then may proceed with Cephalosporin  . Sulfamethoxazole Rash    Medications:  Prior to Admission medications   Medication Sig Start Date End Date Taking? Authorizing Provider  acetaminophen (TYLENOL) 500 MG tablet Take 1,000 mg by mouth  every 6 (six) hours as needed for mild pain or moderate pain.   Yes [provider]  albuterol (PROVENTIL HFA;VENTOLIN HFA) 108 (90 Base) MCG/ACT inhaler Inhale 2 puffs into the lungs every 4 (four) hours as needed for wheezing or shortness of breath. 06/05/15  Yes Gottschalk, Leatrice Jewels M, DO  allopurinol (ZYLOPRIM) 100 MG tablet Take 100 mg by mouth once daily on non-dialysis days (Tues/Thurs/Sat/Sun) Patient taking differently: Take 100 mg by mouth See admin instructions. Take one tablet (100 mg) by mouth once daily on non-dialysis days (Tues/Thurs/Sat/Sun) 11/13/15  Yes Smiley Houseman, MD  amLODipine (NORVASC) 10 MG tablet Take 1 tablet (10 mg total) by mouth at bedtime. 11/12/16  Yes Martinique, Betty G, MD  aspirin EC 81 MG tablet Take 81 mg  by mouth daily.    Yes [provider]  atorvastatin (LIPITOR) 40 MG tablet Take 1 tablet (40 mg total) by mouth daily. Patient taking differently: Take 40 mg by mouth at bedtime.  02/26/15  Yes Rumley, [place] N, DO  azelastine (ASTELIN) 0.1 % nasal spray Place 1 spray into both nostrils 2 (two) times daily. Use in each nostril as directed Patient taking differently: Place 1 spray into both nostrils 2 (two) times daily as needed for rhinitis or allergies. Use in each nostril as directed 08/07/16  Yes Martinique, Betty G, MD  cinacalcet (SENSIPAR) 90 MG tablet Take 90 mg by mouth at bedtime.   Yes [provider]  cloNIDine (CATAPRES) 0.2 MG tablet Take 1 tablet (0.2 mg total) by mouth 2 (two) times daily. Patient taking differently: Take 0.3 mg by mouth 3 (three) times daily.  12/08/16  Yes Johnson, Clanford L, MD  DULoxetine (CYMBALTA) 30 MG capsule Take 2 capsules (60 mg total) by mouth daily. Patient taking differently: Take 60 mg by mouth at bedtime.  11/13/16  Yes Ward Givens, NP  fluticasone (FLONASE) 50 MCG/ACT nasal spray Place 2 sprays into both nostrils daily as needed for allergies or rhinitis. 05/08/16  Yes Rogue Bussing,  MD  Homeopathic Products (LEG CRAMP RELIEF PO) Take 1-2 tablets by mouth daily as needed (for leg pain).    Yes [provider]  hydrALAZINE (APRESOLINE) 10 MG tablet Take 1 tablet (10 mg total) by mouth 2 (two) times daily. 11/12/16  Yes Martinique, Betty G, MD  ipratropium (ATROVENT HFA) 17 MCG/ACT inhaler Inhale 2 puffs into the lungs every 4 (four) hours as needed for wheezing. 06/05/15  Yes Ronnie Doss M, DO  lidocaine-prilocaine (EMLA) cream Apply 1 application topically See admin instructions. Apply topically one hour before dialysis - Monday, Wednesday, Friday   Yes [provider]  loratadine (CLARITIN) 10 MG tablet Take 1 tablet (10 mg total) by mouth daily as needed for allergies. 08/09/16  Yes Martinique, Betty G, MD  losartan (COZAAR) 100 MG tablet Take 1 tablet (100 mg total) by mouth daily. 11/12/16  Yes Martinique, Betty G, MD  metoprolol succinate (TOPROL-XL) 100 MG 24 hr tablet Take 150 mg by mouth at bedtime. 12/06/16  Yes [provider]  multivitamin (RENA-VIT) TABS tablet Take 1 tablet by mouth at bedtime. Patient taking differently: Take 1 tablet by mouth daily.  01/01/15  Yes Haney, Alyssa A, MD  omeprazole (PRILOSEC) 40 MG capsule Take 1 capsule (40 mg total) by mouth 2 (two) times daily. 12/27/15  Yes Ladene Artist, MD  pregabalin (LYRICA) 100 MG capsule Take 100 mg by mouth at bedtime.    Yes [provider]  sevelamer carbonate (RENVELA) 800 MG tablet Take 2,400 mg by mouth See admin instructions. Take 3 tablets (2400 mg) by mouth with each meal - 4 times daily   Yes [provider]    ROS:  Out of a complete 14 system review of symptoms, the patient complains only of the following symptoms, and all other reviewed systems are negative.  Decreased activity Cold intolerance Incontinence of the bowels Restless legs, insomnia, frequent waking, daytime sleepiness, snoring Decreased urine Back pain, muscle cramps, walking  difficulty Weakness, tremors  Blood pressure (!) 181/67, pulse 65, height 5\' 1"  (1.549 m), weight 179 lb (81.2 kg).   Blood pressure, right arm, sitting is 122/58. Blood pressure, right arm, standing is 120/60.  Physical Exam  General: The patient is alert and cooperative  at the time of the examination. The patient is moderately obese.  Skin: No significant peripheral edema is noted.   Neurologic Exam  Mental status: The patient is alert and oriented x 3 at the time of the examination. The patient has apparent normal recent and remote memory, with an apparently normal attention span and concentration ability.   Cranial nerves: Facial symmetry is present. Speech is normal, no aphasia or dysarthria is noted. Extraocular movements are full. Visual fields are full.  Motor: The patient has good strength in all 4 extremities to direct testing.  Sensory examination: Soft touch sensation is symmetric on the face, arms, and legs.  Coordination: The patient has good finger-nose-finger and heel-to-shin bilaterally. No asterixis is seen in the arms.  Gait and station: The patient has a wide-based gait, the patient walks with a cane. Tandem gait was not attempted. Romberg is negative. No drift is seen.  Reflexes: Deep tendon reflexes are symmetric, but are depressed.   Assessment/Plan:  1. Reported gait disturbance, leg weakness  2. End-stage renal disease  3. Peripheral neuropathy  The patient had a recent change in her ability to ambulate. The etiology is not clear. Further blood will be done today. She will have MRI of the lumbar spine to exclude the possibility of an epidural hematoma from the steroid injection in late June. The patient may require EMG and nerve conduction study evaluation in the future. The patient be taken down on the dose of Cymbalta going to 60 mg a day. The patient has end-stage renal disease and is on 100 mg of Lyrica, this dose may need to be decreased as well.  The patient will follow-up for her next scheduled appointment. She will continue physical therapy for now. She reports jerking of the legs at nighttime which is new.  Jill Alexanders MD 01/08/2017 12:50 PM  [facility] [address] [place], [place] [address]  Phone [phone] Fax [phone]

## 2017-01-08 NOTE — Patient Instructions (Signed)
   We will cut back on the Cymbalta to one 60 mg tablet at night. We will get MRI of the low back.  We will get blood work today.

## 2017-01-08 NOTE — Telephone Encounter (Signed)
Christina/[facility] [phone] called to verify if pain was chronic or acute before filling rx for tramadol. Per Katrina note said chronic. Message relayed  Juluis Rainier

## 2017-01-09 DIAGNOSIS — Z283 Underimmunization status: Secondary | ICD-10-CM | POA: Diagnosis not present

## 2017-01-09 DIAGNOSIS — N2581 Secondary hyperparathyroidism of renal origin: Secondary | ICD-10-CM | POA: Diagnosis not present

## 2017-01-09 DIAGNOSIS — D631 Anemia in chronic kidney disease: Secondary | ICD-10-CM | POA: Diagnosis not present

## 2017-01-09 DIAGNOSIS — Z23 Encounter for immunization: Secondary | ICD-10-CM | POA: Diagnosis not present

## 2017-01-09 DIAGNOSIS — N186 End stage renal disease: Secondary | ICD-10-CM | POA: Diagnosis not present

## 2017-01-09 LAB — COMPREHENSIVE METABOLIC PANEL
A/G RATIO: 1.2 (ref 1.2–2.2)
ALBUMIN: 3.7 g/dL (ref 3.5–4.8)
ALT: 8 IU/L (ref 0–32)
AST: 16 IU/L (ref 0–40)
Alkaline Phosphatase: 96 IU/L (ref 39–117)
BILIRUBIN TOTAL: 0.2 mg/dL (ref 0.0–1.2)
BUN / CREAT RATIO: 2 — AB (ref 12–28)
BUN: 13 mg/dL (ref 8–27)
CHLORIDE: 94 mmol/L — AB (ref 96–106)
CO2: 27 mmol/L (ref 20–29)
Calcium: 9.8 mg/dL (ref 8.7–10.3)
Creatinine, Ser: 5.97 mg/dL — ABNORMAL HIGH (ref 0.57–1.00)
GFR calc non Af Amer: 6 mL/min/{1.73_m2} — ABNORMAL LOW (ref >59)
GFR, EST AFRICAN AMERICAN: 7 mL/min/{1.73_m2} — AB (ref >59)
Globulin, Total: 3.1 g/dL (ref 1.5–4.5)
Glucose: 94 mg/dL (ref 65–99)
POTASSIUM: 4.2 mmol/L (ref 3.5–5.2)
Sodium: 139 mmol/L (ref 134–144)
TOTAL PROTEIN: 6.8 g/dL (ref 6.0–8.5)

## 2017-01-09 LAB — CK: Total CK: 29 U/L (ref 24–173)

## 2017-01-09 LAB — VITAMIN B12: Vitamin B-12: 1524 pg/mL — ABNORMAL HIGH (ref 232–1245)

## 2017-01-12 DIAGNOSIS — Z283 Underimmunization status: Secondary | ICD-10-CM | POA: Diagnosis not present

## 2017-01-12 DIAGNOSIS — N186 End stage renal disease: Secondary | ICD-10-CM | POA: Diagnosis not present

## 2017-01-12 DIAGNOSIS — Z23 Encounter for immunization: Secondary | ICD-10-CM | POA: Diagnosis not present

## 2017-01-12 DIAGNOSIS — D631 Anemia in chronic kidney disease: Secondary | ICD-10-CM | POA: Diagnosis not present

## 2017-01-12 DIAGNOSIS — N2581 Secondary hyperparathyroidism of renal origin: Secondary | ICD-10-CM | POA: Diagnosis not present

## 2017-01-13 ENCOUNTER — Encounter (INDEPENDENT_AMBULATORY_CARE_PROVIDER_SITE_OTHER): Payer: Medicare Other

## 2017-01-13 ENCOUNTER — Ambulatory Visit (INDEPENDENT_AMBULATORY_CARE_PROVIDER_SITE_OTHER): Payer: Medicare Other | Admitting: Vascular Surgery

## 2017-01-13 ENCOUNTER — Telehealth: Payer: Self-pay

## 2017-01-13 DIAGNOSIS — J449 Chronic obstructive pulmonary disease, unspecified: Secondary | ICD-10-CM | POA: Diagnosis not present

## 2017-01-13 DIAGNOSIS — R2681 Unsteadiness on feet: Secondary | ICD-10-CM | POA: Diagnosis not present

## 2017-01-13 DIAGNOSIS — N186 End stage renal disease: Secondary | ICD-10-CM | POA: Diagnosis not present

## 2017-01-13 DIAGNOSIS — Z8701 Personal history of pneumonia (recurrent): Secondary | ICD-10-CM | POA: Diagnosis not present

## 2017-01-13 DIAGNOSIS — I5030 Unspecified diastolic (congestive) heart failure: Secondary | ICD-10-CM | POA: Diagnosis not present

## 2017-01-13 DIAGNOSIS — I132 Hypertensive heart and chronic kidney disease with heart failure and with stage 5 chronic kidney disease, or end stage renal disease: Secondary | ICD-10-CM | POA: Diagnosis not present

## 2017-01-13 NOTE — Telephone Encounter (Signed)
David/[facility] and pt called to report that pt's BP has continued to be elevated. She is taking all medications as directed. She reports readings averaging 210/60 daily, but this does come down some to systolic 202'R after dialysis. Shanon Brow is there for PT on Tue/Thu and will be back to see pt this coming Thu.   Per chart pt does not have cardiologist or pulmonologist so referrals may be needed.  Dr. Martinique - Please advise. Thanks!

## 2017-01-14 DIAGNOSIS — Z23 Encounter for immunization: Secondary | ICD-10-CM | POA: Diagnosis not present

## 2017-01-14 DIAGNOSIS — N186 End stage renal disease: Secondary | ICD-10-CM | POA: Diagnosis not present

## 2017-01-14 DIAGNOSIS — D631 Anemia in chronic kidney disease: Secondary | ICD-10-CM | POA: Diagnosis not present

## 2017-01-14 DIAGNOSIS — Z283 Underimmunization status: Secondary | ICD-10-CM | POA: Diagnosis not present

## 2017-01-14 DIAGNOSIS — N2581 Secondary hyperparathyroidism of renal origin: Secondary | ICD-10-CM | POA: Diagnosis not present

## 2017-01-14 NOTE — Telephone Encounter (Signed)
She is already on different antihypertensive medications. She can increase dose of Metoprolol Succinate from 150 mg to 200 mg daily. Caution with hypotension, mainly after dialysis. Low salt diet. She is supposed to have an appt coming.  Thanks, BJ

## 2017-01-15 ENCOUNTER — Telehealth: Payer: Self-pay | Admitting: Family Medicine

## 2017-01-15 ENCOUNTER — Other Ambulatory Visit: Payer: Self-pay

## 2017-01-15 DIAGNOSIS — N186 End stage renal disease: Secondary | ICD-10-CM | POA: Diagnosis not present

## 2017-01-15 DIAGNOSIS — R2681 Unsteadiness on feet: Secondary | ICD-10-CM | POA: Diagnosis not present

## 2017-01-15 DIAGNOSIS — Z8701 Personal history of pneumonia (recurrent): Secondary | ICD-10-CM | POA: Diagnosis not present

## 2017-01-15 DIAGNOSIS — I5030 Unspecified diastolic (congestive) heart failure: Secondary | ICD-10-CM | POA: Diagnosis not present

## 2017-01-15 DIAGNOSIS — J449 Chronic obstructive pulmonary disease, unspecified: Secondary | ICD-10-CM | POA: Diagnosis not present

## 2017-01-15 DIAGNOSIS — I132 Hypertensive heart and chronic kidney disease with heart failure and with stage 5 chronic kidney disease, or end stage renal disease: Secondary | ICD-10-CM | POA: Diagnosis not present

## 2017-01-15 MED ORDER — METOPROLOL SUCCINATE 200 MG PO CS24: 1.0000 | EXTENDED_RELEASE_CAPSULE | Freq: Two times a day (BID) | ORAL | 5 refills | Status: DC

## 2017-01-15 NOTE — Telephone Encounter (Signed)
I am not sure if "arterial electrogram testing" would change management. I am not sure exactly which test they meant but this can certainly can be discussed with her nephrologists (Hx of end stage renal disease) and we can also discuss it next OV. Thanks, BJ

## 2017-01-15 NOTE — Telephone Encounter (Signed)
Requesting an occupational therapy evalation for strategies with ADL's and IADL's. Call back number [phone]  Shanon Brow from [facility], he's a Physical Therapist

## 2017-01-15 NOTE — Telephone Encounter (Signed)
Spoke with pt and advised. Rx sent to pharmacy. She will monitor BP, especially after dialysis and call office if hypotensive. She is also asking about having an some arterial electrogram testing done, recommended by N[facility].   Dr. Martinique - Please advise on testing. Thanks!

## 2017-01-16 DIAGNOSIS — Z23 Encounter for immunization: Secondary | ICD-10-CM | POA: Diagnosis not present

## 2017-01-16 DIAGNOSIS — D631 Anemia in chronic kidney disease: Secondary | ICD-10-CM | POA: Diagnosis not present

## 2017-01-16 DIAGNOSIS — N186 End stage renal disease: Secondary | ICD-10-CM | POA: Diagnosis not present

## 2017-01-16 DIAGNOSIS — Z283 Underimmunization status: Secondary | ICD-10-CM | POA: Diagnosis not present

## 2017-01-16 DIAGNOSIS — N2581 Secondary hyperparathyroidism of renal origin: Secondary | ICD-10-CM | POA: Diagnosis not present

## 2017-01-16 NOTE — Telephone Encounter (Signed)
Left voicemail with verbal.

## 2017-01-16 NOTE — Telephone Encounter (Signed)
LMTCB

## 2017-01-16 NOTE — Telephone Encounter (Signed)
Ok to refer to OT as requested, not sure about insurance coverage, may need a face to face if [facility] arrangements needed.  Thanks, BJ

## 2017-01-17 ENCOUNTER — Ambulatory Visit
Admission: RE | Admit: 2017-01-17 | Discharge: 2017-01-17 | Disposition: A | Discharge status: Home / Self Care | Payer: Medicare Other | Source: Ambulatory Visit | Attending: Neurology | Admitting: Neurology

## 2017-01-17 DIAGNOSIS — R531 Weakness: Secondary | ICD-10-CM

## 2017-01-17 DIAGNOSIS — M48061 Spinal stenosis, lumbar region without neurogenic claudication: Secondary | ICD-10-CM | POA: Diagnosis not present

## 2017-01-19 ENCOUNTER — Telehealth: Payer: Self-pay | Admitting: Neurology

## 2017-01-19 DIAGNOSIS — D631 Anemia in chronic kidney disease: Secondary | ICD-10-CM | POA: Diagnosis not present

## 2017-01-19 DIAGNOSIS — R29898 Other symptoms and signs involving the musculoskeletal system: Secondary | ICD-10-CM

## 2017-01-19 DIAGNOSIS — Z23 Encounter for immunization: Secondary | ICD-10-CM | POA: Diagnosis not present

## 2017-01-19 DIAGNOSIS — N2581 Secondary hyperparathyroidism of renal origin: Secondary | ICD-10-CM | POA: Diagnosis not present

## 2017-01-19 DIAGNOSIS — N186 End stage renal disease: Secondary | ICD-10-CM | POA: Diagnosis not present

## 2017-01-19 DIAGNOSIS — Z283 Underimmunization status: Secondary | ICD-10-CM | POA: Diagnosis not present

## 2017-01-19 NOTE — Telephone Encounter (Signed)
I called patient. The MRI of the lumbar spine does not show any evidence of spinal stenosis, or evidence of nerve root impingement. The etiology of her change in her ability to walk is not clear.   The patient is getting physical therapy, this is not helping much. The physical therapy individual has noted that the legs are cold, wonders if arterial circulation studies have been done.   We will check EMG nerve conduction study again, this was done 2016. If this does not show an etiology of her symptoms we may consider arterial Doppler studies.    MRI lumbar 01/18/17:  IMPRESSION:  This MRI of the lumbar spine shows multilevel degenerative changes as detailed above that did not lead to any nerve root compression. The most significant findings are: 1.    At L2-L3 and L3-L4, there is mild spinal stenosis.   There does not appear to be any nerve root compression. 2.   At L4-L5, there is borderline spinal stenosis is not appear to be any nerve root compression. 3.   Compared to the MRI dated 05/12/2015, there is no significant interval change.

## 2017-01-20 ENCOUNTER — Other Ambulatory Visit: Payer: Self-pay

## 2017-01-20 DIAGNOSIS — Z8701 Personal history of pneumonia (recurrent): Secondary | ICD-10-CM | POA: Diagnosis not present

## 2017-01-20 DIAGNOSIS — N186 End stage renal disease: Secondary | ICD-10-CM | POA: Diagnosis not present

## 2017-01-20 DIAGNOSIS — J449 Chronic obstructive pulmonary disease, unspecified: Secondary | ICD-10-CM | POA: Diagnosis not present

## 2017-01-20 DIAGNOSIS — I5030 Unspecified diastolic (congestive) heart failure: Secondary | ICD-10-CM | POA: Diagnosis not present

## 2017-01-20 DIAGNOSIS — I132 Hypertensive heart and chronic kidney disease with heart failure and with stage 5 chronic kidney disease, or end stage renal disease: Secondary | ICD-10-CM | POA: Diagnosis not present

## 2017-01-20 DIAGNOSIS — R2681 Unsteadiness on feet: Secondary | ICD-10-CM | POA: Diagnosis not present

## 2017-01-20 MED ORDER — METOPROLOL SUCCINATE ER 100 MG PO TB24: 100.0000 mg | ORAL_TABLET | Freq: Two times a day (BID) | ORAL | 1 refills | Status: DC

## 2017-01-21 DIAGNOSIS — N2581 Secondary hyperparathyroidism of renal origin: Secondary | ICD-10-CM | POA: Diagnosis not present

## 2017-01-21 DIAGNOSIS — N186 End stage renal disease: Secondary | ICD-10-CM | POA: Diagnosis not present

## 2017-01-21 DIAGNOSIS — Z23 Encounter for immunization: Secondary | ICD-10-CM | POA: Diagnosis not present

## 2017-01-21 DIAGNOSIS — Z283 Underimmunization status: Secondary | ICD-10-CM | POA: Diagnosis not present

## 2017-01-21 DIAGNOSIS — D631 Anemia in chronic kidney disease: Secondary | ICD-10-CM | POA: Diagnosis not present

## 2017-01-21 NOTE — Telephone Encounter (Signed)
Patient's daughter had called in to get clarification on patient's Metoprolol. Advised patient's daughter she should now be taking 200 mg daily since her blood pressure is elevated. Patient's daughter had both bottles with her, so she just wanted to make sure she was giving her the correct dosage. Patient's daughter verbalized understanding that patient should be taking 200 mg daily of her Metoprolol Succinate, and to schedule a follow up soon.

## 2017-01-22 DIAGNOSIS — I132 Hypertensive heart and chronic kidney disease with heart failure and with stage 5 chronic kidney disease, or end stage renal disease: Secondary | ICD-10-CM | POA: Diagnosis not present

## 2017-01-22 DIAGNOSIS — Z8701 Personal history of pneumonia (recurrent): Secondary | ICD-10-CM | POA: Diagnosis not present

## 2017-01-22 DIAGNOSIS — N186 End stage renal disease: Secondary | ICD-10-CM | POA: Diagnosis not present

## 2017-01-22 DIAGNOSIS — I5030 Unspecified diastolic (congestive) heart failure: Secondary | ICD-10-CM | POA: Diagnosis not present

## 2017-01-22 DIAGNOSIS — R2681 Unsteadiness on feet: Secondary | ICD-10-CM | POA: Diagnosis not present

## 2017-01-22 DIAGNOSIS — J449 Chronic obstructive pulmonary disease, unspecified: Secondary | ICD-10-CM | POA: Diagnosis not present

## 2017-01-23 DIAGNOSIS — Z23 Encounter for immunization: Secondary | ICD-10-CM | POA: Diagnosis not present

## 2017-01-23 DIAGNOSIS — D631 Anemia in chronic kidney disease: Secondary | ICD-10-CM | POA: Diagnosis not present

## 2017-01-23 DIAGNOSIS — Z283 Underimmunization status: Secondary | ICD-10-CM | POA: Diagnosis not present

## 2017-01-23 DIAGNOSIS — N186 End stage renal disease: Secondary | ICD-10-CM | POA: Diagnosis not present

## 2017-01-23 DIAGNOSIS — N2581 Secondary hyperparathyroidism of renal origin: Secondary | ICD-10-CM | POA: Diagnosis not present

## 2017-01-26 DIAGNOSIS — N186 End stage renal disease: Secondary | ICD-10-CM | POA: Diagnosis not present

## 2017-01-26 DIAGNOSIS — D631 Anemia in chronic kidney disease: Secondary | ICD-10-CM | POA: Diagnosis not present

## 2017-01-26 DIAGNOSIS — N2581 Secondary hyperparathyroidism of renal origin: Secondary | ICD-10-CM | POA: Diagnosis not present

## 2017-01-26 DIAGNOSIS — Z283 Underimmunization status: Secondary | ICD-10-CM | POA: Diagnosis not present

## 2017-01-26 DIAGNOSIS — Z23 Encounter for immunization: Secondary | ICD-10-CM | POA: Diagnosis not present

## 2017-01-27 DIAGNOSIS — Z8701 Personal history of pneumonia (recurrent): Secondary | ICD-10-CM | POA: Diagnosis not present

## 2017-01-27 DIAGNOSIS — J449 Chronic obstructive pulmonary disease, unspecified: Secondary | ICD-10-CM | POA: Diagnosis not present

## 2017-01-27 DIAGNOSIS — I5030 Unspecified diastolic (congestive) heart failure: Secondary | ICD-10-CM | POA: Diagnosis not present

## 2017-01-27 DIAGNOSIS — I132 Hypertensive heart and chronic kidney disease with heart failure and with stage 5 chronic kidney disease, or end stage renal disease: Secondary | ICD-10-CM | POA: Diagnosis not present

## 2017-01-27 DIAGNOSIS — R2681 Unsteadiness on feet: Secondary | ICD-10-CM | POA: Diagnosis not present

## 2017-01-27 DIAGNOSIS — N186 End stage renal disease: Secondary | ICD-10-CM | POA: Diagnosis not present

## 2017-01-28 DIAGNOSIS — N2581 Secondary hyperparathyroidism of renal origin: Secondary | ICD-10-CM | POA: Diagnosis not present

## 2017-01-28 DIAGNOSIS — D631 Anemia in chronic kidney disease: Secondary | ICD-10-CM | POA: Diagnosis not present

## 2017-01-28 DIAGNOSIS — Z23 Encounter for immunization: Secondary | ICD-10-CM | POA: Diagnosis not present

## 2017-01-28 DIAGNOSIS — Z283 Underimmunization status: Secondary | ICD-10-CM | POA: Diagnosis not present

## 2017-01-28 DIAGNOSIS — N186 End stage renal disease: Secondary | ICD-10-CM | POA: Diagnosis not present

## 2017-01-29 DIAGNOSIS — R2681 Unsteadiness on feet: Secondary | ICD-10-CM | POA: Diagnosis not present

## 2017-01-29 DIAGNOSIS — I132 Hypertensive heart and chronic kidney disease with heart failure and with stage 5 chronic kidney disease, or end stage renal disease: Secondary | ICD-10-CM | POA: Diagnosis not present

## 2017-01-29 DIAGNOSIS — N186 End stage renal disease: Secondary | ICD-10-CM | POA: Diagnosis not present

## 2017-01-29 DIAGNOSIS — I5030 Unspecified diastolic (congestive) heart failure: Secondary | ICD-10-CM | POA: Diagnosis not present

## 2017-01-29 DIAGNOSIS — Z8701 Personal history of pneumonia (recurrent): Secondary | ICD-10-CM | POA: Diagnosis not present

## 2017-01-29 DIAGNOSIS — J449 Chronic obstructive pulmonary disease, unspecified: Secondary | ICD-10-CM | POA: Diagnosis not present

## 2017-01-30 DIAGNOSIS — Z23 Encounter for immunization: Secondary | ICD-10-CM | POA: Diagnosis not present

## 2017-01-30 DIAGNOSIS — Z283 Underimmunization status: Secondary | ICD-10-CM | POA: Diagnosis not present

## 2017-01-30 DIAGNOSIS — D631 Anemia in chronic kidney disease: Secondary | ICD-10-CM | POA: Diagnosis not present

## 2017-01-30 DIAGNOSIS — N2581 Secondary hyperparathyroidism of renal origin: Secondary | ICD-10-CM | POA: Diagnosis not present

## 2017-01-30 DIAGNOSIS — N186 End stage renal disease: Secondary | ICD-10-CM | POA: Diagnosis not present

## 2017-01-30 DIAGNOSIS — E1129 Type 2 diabetes mellitus with other diabetic kidney complication: Secondary | ICD-10-CM | POA: Diagnosis not present

## 2017-01-30 DIAGNOSIS — Z992 Dependence on renal dialysis: Secondary | ICD-10-CM | POA: Diagnosis not present

## 2017-02-02 DIAGNOSIS — Z283 Underimmunization status: Secondary | ICD-10-CM | POA: Diagnosis not present

## 2017-02-02 DIAGNOSIS — N186 End stage renal disease: Secondary | ICD-10-CM | POA: Diagnosis not present

## 2017-02-02 DIAGNOSIS — Z23 Encounter for immunization: Secondary | ICD-10-CM | POA: Diagnosis not present

## 2017-02-02 DIAGNOSIS — N2581 Secondary hyperparathyroidism of renal origin: Secondary | ICD-10-CM | POA: Diagnosis not present

## 2017-02-02 DIAGNOSIS — D631 Anemia in chronic kidney disease: Secondary | ICD-10-CM | POA: Diagnosis not present

## 2017-02-04 DIAGNOSIS — N186 End stage renal disease: Secondary | ICD-10-CM | POA: Diagnosis not present

## 2017-02-04 DIAGNOSIS — N2581 Secondary hyperparathyroidism of renal origin: Secondary | ICD-10-CM | POA: Diagnosis not present

## 2017-02-04 DIAGNOSIS — D631 Anemia in chronic kidney disease: Secondary | ICD-10-CM | POA: Diagnosis not present

## 2017-02-04 DIAGNOSIS — Z283 Underimmunization status: Secondary | ICD-10-CM | POA: Diagnosis not present

## 2017-02-04 DIAGNOSIS — Z23 Encounter for immunization: Secondary | ICD-10-CM | POA: Diagnosis not present

## 2017-02-05 DIAGNOSIS — N186 End stage renal disease: Secondary | ICD-10-CM | POA: Diagnosis not present

## 2017-02-05 DIAGNOSIS — I132 Hypertensive heart and chronic kidney disease with heart failure and with stage 5 chronic kidney disease, or end stage renal disease: Secondary | ICD-10-CM | POA: Diagnosis not present

## 2017-02-05 DIAGNOSIS — I5030 Unspecified diastolic (congestive) heart failure: Secondary | ICD-10-CM | POA: Diagnosis not present

## 2017-02-05 DIAGNOSIS — J449 Chronic obstructive pulmonary disease, unspecified: Secondary | ICD-10-CM | POA: Diagnosis not present

## 2017-02-05 DIAGNOSIS — Z8701 Personal history of pneumonia (recurrent): Secondary | ICD-10-CM | POA: Diagnosis not present

## 2017-02-05 DIAGNOSIS — R2681 Unsteadiness on feet: Secondary | ICD-10-CM | POA: Diagnosis not present

## 2017-02-06 DIAGNOSIS — Z283 Underimmunization status: Secondary | ICD-10-CM | POA: Diagnosis not present

## 2017-02-06 DIAGNOSIS — Z23 Encounter for immunization: Secondary | ICD-10-CM | POA: Diagnosis not present

## 2017-02-06 DIAGNOSIS — N186 End stage renal disease: Secondary | ICD-10-CM | POA: Diagnosis not present

## 2017-02-06 DIAGNOSIS — N2581 Secondary hyperparathyroidism of renal origin: Secondary | ICD-10-CM | POA: Diagnosis not present

## 2017-02-06 DIAGNOSIS — D631 Anemia in chronic kidney disease: Secondary | ICD-10-CM | POA: Diagnosis not present

## 2017-02-09 DIAGNOSIS — D631 Anemia in chronic kidney disease: Secondary | ICD-10-CM | POA: Diagnosis not present

## 2017-02-09 DIAGNOSIS — Z283 Underimmunization status: Secondary | ICD-10-CM | POA: Diagnosis not present

## 2017-02-09 DIAGNOSIS — N186 End stage renal disease: Secondary | ICD-10-CM | POA: Diagnosis not present

## 2017-02-09 DIAGNOSIS — N2581 Secondary hyperparathyroidism of renal origin: Secondary | ICD-10-CM | POA: Diagnosis not present

## 2017-02-09 DIAGNOSIS — Z23 Encounter for immunization: Secondary | ICD-10-CM | POA: Diagnosis not present

## 2017-02-10 ENCOUNTER — Encounter: Payer: Medicare Other | Admitting: Neurology

## 2017-02-10 DIAGNOSIS — N186 End stage renal disease: Secondary | ICD-10-CM | POA: Diagnosis not present

## 2017-02-10 DIAGNOSIS — I871 Compression of vein: Secondary | ICD-10-CM | POA: Diagnosis not present

## 2017-02-10 DIAGNOSIS — Z992 Dependence on renal dialysis: Secondary | ICD-10-CM | POA: Diagnosis not present

## 2017-02-10 DIAGNOSIS — T82858A Stenosis of vascular prosthetic devices, implants and grafts, initial encounter: Secondary | ICD-10-CM | POA: Diagnosis not present

## 2017-02-11 DIAGNOSIS — Z283 Underimmunization status: Secondary | ICD-10-CM | POA: Diagnosis not present

## 2017-02-11 DIAGNOSIS — D631 Anemia in chronic kidney disease: Secondary | ICD-10-CM | POA: Diagnosis not present

## 2017-02-11 DIAGNOSIS — N186 End stage renal disease: Secondary | ICD-10-CM | POA: Diagnosis not present

## 2017-02-11 DIAGNOSIS — Z23 Encounter for immunization: Secondary | ICD-10-CM | POA: Diagnosis not present

## 2017-02-11 DIAGNOSIS — N2581 Secondary hyperparathyroidism of renal origin: Secondary | ICD-10-CM | POA: Diagnosis not present

## 2017-02-13 DIAGNOSIS — D631 Anemia in chronic kidney disease: Secondary | ICD-10-CM | POA: Diagnosis not present

## 2017-02-13 DIAGNOSIS — Z23 Encounter for immunization: Secondary | ICD-10-CM | POA: Diagnosis not present

## 2017-02-13 DIAGNOSIS — Z283 Underimmunization status: Secondary | ICD-10-CM | POA: Diagnosis not present

## 2017-02-13 DIAGNOSIS — N186 End stage renal disease: Secondary | ICD-10-CM | POA: Diagnosis not present

## 2017-02-13 DIAGNOSIS — N2581 Secondary hyperparathyroidism of renal origin: Secondary | ICD-10-CM | POA: Diagnosis not present

## 2017-02-16 DIAGNOSIS — N186 End stage renal disease: Secondary | ICD-10-CM | POA: Diagnosis not present

## 2017-02-16 DIAGNOSIS — Z283 Underimmunization status: Secondary | ICD-10-CM | POA: Diagnosis not present

## 2017-02-16 DIAGNOSIS — Z23 Encounter for immunization: Secondary | ICD-10-CM | POA: Diagnosis not present

## 2017-02-16 DIAGNOSIS — N2581 Secondary hyperparathyroidism of renal origin: Secondary | ICD-10-CM | POA: Diagnosis not present

## 2017-02-16 DIAGNOSIS — D631 Anemia in chronic kidney disease: Secondary | ICD-10-CM | POA: Diagnosis not present

## 2017-02-18 DIAGNOSIS — N2581 Secondary hyperparathyroidism of renal origin: Secondary | ICD-10-CM | POA: Diagnosis not present

## 2017-02-18 DIAGNOSIS — N186 End stage renal disease: Secondary | ICD-10-CM | POA: Diagnosis not present

## 2017-02-18 DIAGNOSIS — D631 Anemia in chronic kidney disease: Secondary | ICD-10-CM | POA: Diagnosis not present

## 2017-02-18 DIAGNOSIS — Z23 Encounter for immunization: Secondary | ICD-10-CM | POA: Diagnosis not present

## 2017-02-18 DIAGNOSIS — Z283 Underimmunization status: Secondary | ICD-10-CM | POA: Diagnosis not present

## 2017-02-19 ENCOUNTER — Encounter: Payer: Self-pay | Admitting: Family Medicine

## 2017-02-20 DIAGNOSIS — N186 End stage renal disease: Secondary | ICD-10-CM | POA: Diagnosis not present

## 2017-02-20 DIAGNOSIS — D631 Anemia in chronic kidney disease: Secondary | ICD-10-CM | POA: Diagnosis not present

## 2017-02-20 DIAGNOSIS — Z283 Underimmunization status: Secondary | ICD-10-CM | POA: Diagnosis not present

## 2017-02-20 DIAGNOSIS — Z23 Encounter for immunization: Secondary | ICD-10-CM | POA: Diagnosis not present

## 2017-02-20 DIAGNOSIS — N2581 Secondary hyperparathyroidism of renal origin: Secondary | ICD-10-CM | POA: Diagnosis not present

## 2017-02-23 DIAGNOSIS — N186 End stage renal disease: Secondary | ICD-10-CM | POA: Diagnosis not present

## 2017-02-23 DIAGNOSIS — N2581 Secondary hyperparathyroidism of renal origin: Secondary | ICD-10-CM | POA: Diagnosis not present

## 2017-02-23 DIAGNOSIS — Z283 Underimmunization status: Secondary | ICD-10-CM | POA: Diagnosis not present

## 2017-02-23 DIAGNOSIS — D631 Anemia in chronic kidney disease: Secondary | ICD-10-CM | POA: Diagnosis not present

## 2017-02-23 DIAGNOSIS — Z23 Encounter for immunization: Secondary | ICD-10-CM | POA: Diagnosis not present

## 2017-02-25 DIAGNOSIS — D631 Anemia in chronic kidney disease: Secondary | ICD-10-CM | POA: Diagnosis not present

## 2017-02-25 DIAGNOSIS — N186 End stage renal disease: Secondary | ICD-10-CM | POA: Diagnosis not present

## 2017-02-25 DIAGNOSIS — N2581 Secondary hyperparathyroidism of renal origin: Secondary | ICD-10-CM | POA: Diagnosis not present

## 2017-02-25 DIAGNOSIS — Z23 Encounter for immunization: Secondary | ICD-10-CM | POA: Diagnosis not present

## 2017-02-25 DIAGNOSIS — Z283 Underimmunization status: Secondary | ICD-10-CM | POA: Diagnosis not present

## 2017-02-27 DIAGNOSIS — D631 Anemia in chronic kidney disease: Secondary | ICD-10-CM | POA: Diagnosis not present

## 2017-02-27 DIAGNOSIS — N186 End stage renal disease: Secondary | ICD-10-CM | POA: Diagnosis not present

## 2017-02-27 DIAGNOSIS — N2581 Secondary hyperparathyroidism of renal origin: Secondary | ICD-10-CM | POA: Diagnosis not present

## 2017-02-27 DIAGNOSIS — Z23 Encounter for immunization: Secondary | ICD-10-CM | POA: Diagnosis not present

## 2017-02-27 DIAGNOSIS — Z283 Underimmunization status: Secondary | ICD-10-CM | POA: Diagnosis not present

## 2017-03-01 DIAGNOSIS — Z992 Dependence on renal dialysis: Secondary | ICD-10-CM | POA: Diagnosis not present

## 2017-03-01 DIAGNOSIS — E1129 Type 2 diabetes mellitus with other diabetic kidney complication: Secondary | ICD-10-CM | POA: Diagnosis not present

## 2017-03-01 DIAGNOSIS — N186 End stage renal disease: Secondary | ICD-10-CM | POA: Diagnosis not present

## 2017-03-03 DIAGNOSIS — N2581 Secondary hyperparathyroidism of renal origin: Secondary | ICD-10-CM | POA: Diagnosis not present

## 2017-03-03 DIAGNOSIS — N186 End stage renal disease: Secondary | ICD-10-CM | POA: Diagnosis not present

## 2017-03-03 DIAGNOSIS — D631 Anemia in chronic kidney disease: Secondary | ICD-10-CM | POA: Diagnosis not present

## 2017-03-04 DIAGNOSIS — N2581 Secondary hyperparathyroidism of renal origin: Secondary | ICD-10-CM | POA: Diagnosis not present

## 2017-03-04 DIAGNOSIS — N186 End stage renal disease: Secondary | ICD-10-CM | POA: Diagnosis not present

## 2017-03-04 DIAGNOSIS — D631 Anemia in chronic kidney disease: Secondary | ICD-10-CM | POA: Diagnosis not present

## 2017-03-06 DIAGNOSIS — N186 End stage renal disease: Secondary | ICD-10-CM | POA: Diagnosis not present

## 2017-03-06 DIAGNOSIS — D631 Anemia in chronic kidney disease: Secondary | ICD-10-CM | POA: Diagnosis not present

## 2017-03-06 DIAGNOSIS — N2581 Secondary hyperparathyroidism of renal origin: Secondary | ICD-10-CM | POA: Diagnosis not present

## 2017-03-09 DIAGNOSIS — N186 End stage renal disease: Secondary | ICD-10-CM | POA: Diagnosis not present

## 2017-03-09 DIAGNOSIS — N2581 Secondary hyperparathyroidism of renal origin: Secondary | ICD-10-CM | POA: Diagnosis not present

## 2017-03-09 DIAGNOSIS — D631 Anemia in chronic kidney disease: Secondary | ICD-10-CM | POA: Diagnosis not present

## 2017-03-10 NOTE — Progress Notes (Signed)
HPI:   [name] is a 76 y.o. female, who is here today for 3-4 months follow up.   She was last seen on 12/17/16.  Since her last OV she has been in the ER for generalized weakness. She followed with Dr Santo Held, 01/08/17. Lower back pain, peripheral neuropathy, and unstable gait (rigth foot drop). She is on Lyrica 100 mg at bedtime and Cymbalta 30 mg daily. Started on Tramadol 50 mg qid as needed, which she does not take because it keeps her awake at night. Tramadol does help with pain.  She completed PT and has not had any fall since her last OV. She uses a cane to help with stability.  Depression: She is not longer on Abilify. She is taking Cymbalta 30 mg daily. She denies depressed mood or suicidal thoughts.   Hypertension:   ESRD, she has hemodialysis 3 times per week. Currently on Metoprolol Succinate 100 mg bid ,Clonidine 0.3 mg tid, Cozaar 100 mg daily, Hydralazine 10 mg bid,and Amlodipine 10 mg daily.   She is taking medications as instructed, no side effects reported.  She has not noted unusual headache, visual changes, exertional chest pain, dyspnea,or edema.   Lab Results  Component Value Date   CREATININE 5.97 (H) 01/08/2017   BUN 13 01/08/2017   NA 139 01/08/2017   K 4.2 01/08/2017   CL 94 (L) 01/08/2017   CO2 27 01/08/2017    About 4 weeks ago while smoking and driving, her cig fell on medial aspect of her left ankle.  Lesion is healing, denies erythema or edema.  Requesting refills for Albuterol and Atrovent inh, both she uses as needed. She is reporting Hx of asthma. + Smoker. Intermittently symptoms. Last week of September/2018 her "lungs hurt", exertional dyspnea, and wheezing. She had similar symptoms a year ago. Symptoms resolved.  + Nasal congestion, rhinorrhea, and post nasal drainage. She denies fever,chills, or worsening fatigue. She takes Claritin 10 mg daily prn,Flonase nasal spray prn, and Astelin nasal spray  daily prn.  + Decreased appetite.   Review of Systems  Constitutional: Positive for appetite change and fatigue. Negative for activity change, fever and unexpected weight change.  HENT: Positive for congestion, postnasal drip and rhinorrhea. Negative for mouth sores, nosebleeds and trouble swallowing.   Eyes: Positive for discharge. Negative for redness and visual disturbance.  Respiratory: Negative for cough, shortness of breath and wheezing.   Cardiovascular: Negative for chest pain, palpitations and leg swelling.  Gastrointestinal: Negative for abdominal pain, nausea and vomiting.       Negative for changes in bowel habits.  Endocrine: Negative for cold intolerance and heat intolerance.  Genitourinary: Negative for flank pain.  Musculoskeletal: Positive for back pain, gait problem and myalgias.  Skin: Negative for rash and wound.  Allergic/Immunologic: Positive for environmental allergies.  Neurological: Positive for numbness. Negative for syncope, weakness and headaches.  Psychiatric/Behavioral: Negative for confusion and suicidal ideas. The patient is not nervous/anxious.      Current Outpatient Prescriptions on File Prior to Visit  Medication Sig Dispense Refill  . acetaminophen (TYLENOL) 500 MG tablet Take 1,000 mg by mouth every 6 (six) hours as needed for mild pain or moderate pain.    Marland Kitchen allopurinol (ZYLOPRIM) 100 MG tablet Take 100 mg by mouth once daily on non-dialysis days (Tues/Thurs/Sat/Sun) (Patient taking differently: Take 100 mg by mouth See admin instructions. Take one tablet (100 mg) by mouth once daily on non-dialysis days (Tues/Thurs/Sat/Sun)) 30 tablet 0  .  amLODipine (NORVASC) 10 MG tablet Take 1 tablet (10 mg total) by mouth at bedtime. 90 tablet 1  . aspirin EC 81 MG tablet Take 81 mg by mouth daily.     Marland Kitchen atorvastatin (LIPITOR) 40 MG tablet Take 1 tablet (40 mg total) by mouth daily. (Patient taking differently: Take 40 mg by mouth at bedtime. ) 90 tablet 3  .  azelastine (ASTELIN) 0.1 % nasal spray Place 1 spray into both nostrils 2 (two) times daily. Use in each nostril as directed (Patient taking differently: Place 1 spray into both nostrils 2 (two) times daily as needed for rhinitis or allergies. Use in each nostril as directed) 30 mL 6  . cinacalcet (SENSIPAR) 90 MG tablet Take 90 mg by mouth at bedtime.    . cloNIDine (CATAPRES) 0.2 MG tablet Take 1 tablet (0.2 mg total) by mouth 2 (two) times daily. (Patient taking differently: Take 0.3 mg by mouth 3 (three) times daily. )    . DULoxetine (CYMBALTA) 30 MG capsule Take 2 capsules (60 mg total) by mouth daily. 180 capsule 3  . fluticasone (FLONASE) 50 MCG/ACT nasal spray Place 2 sprays into both nostrils daily as needed for allergies or rhinitis. 16 g 3  . Homeopathic Products (LEG CRAMP RELIEF PO) Take 1-2 tablets by mouth daily as needed (for leg pain).     . hydrALAZINE (APRESOLINE) 10 MG tablet Take 1 tablet (10 mg total) by mouth 2 (two) times daily. 180 tablet 1  . lidocaine-prilocaine (EMLA) cream Apply 1 application topically See admin instructions. Apply topically one hour before dialysis - Monday, Wednesday, Friday    . loratadine (CLARITIN) 10 MG tablet Take 1 tablet (10 mg total) by mouth daily as needed for allergies. 90 tablet 3  . losartan (COZAAR) 100 MG tablet Take 1 tablet (100 mg total) by mouth daily. 90 tablet 1  . metoprolol succinate (TOPROL-XL) 100 MG 24 hr tablet Take 1 tablet (100 mg total) by mouth 2 (two) times daily. 60 tablet 1  . multivitamin (RENA-VIT) TABS tablet Take 1 tablet by mouth at bedtime. (Patient taking differently: Take 1 tablet by mouth daily. ) 60 tablet 5  . omeprazole (PRILOSEC) 40 MG capsule Take 1 capsule (40 mg total) by mouth 2 (two) times daily. 60 capsule 11  . pregabalin (LYRICA) 100 MG capsule Take 100 mg by mouth at bedtime.     . sevelamer carbonate (RENVELA) 800 MG tablet Take 2,400 mg by mouth See admin instructions. Take 3 tablets (2400 mg) by  mouth with each meal - 4 times daily     No current facility-administered medications on file prior to visit.      Past Medical History:  Diagnosis Date  . Abnormality of gait 03/28/2015  . Adrenal mass ([place])   . 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 Arthritis   . Back pain   . CHF (congestive heart failure) ([place])   . Chronic kidney disease    Hemo MWF  . Congestion of throat    Pt states she has a lot mucus in back throat.  . Constipation   . COPD (chronic obstructive pulmonary disease) ([place])   . Depression   . Dialysis patient [facility])    kidney  . Diverticulitis   . GERD (gastroesophageal reflux disease)   . H/O hiatal hernia   . High cholesterol   . Hoarseness of voice   . Hyperlipidemia   . Hypertension   .  IBS (irritable bowel syndrome)   . Meralgia paresthetica of right side 12/26/2014  . Normal cardiac stress test 12/24/2009   lexiscan, imaging normal  . Renal disorder   . RLS (restless legs syndrome)   . Seizures ([place])    2004 past brain surgery  . Sinus complaint   . Thyroid disease   . TIA (transient ischemic attack)   . Tubular adenoma of colon 01/2008   Allergies  Allergen Reactions  . Tuberculin Tests Hives    "blisters"  . Valacyclovir Other (See Comments)    Confusion and nervousness  . Codeine Nausea And Vomiting  . Penicillins Rash    No problems breathing. Has tolerated omnicef in past without issue Has patient had a PCN reaction causing immediate rash, facial/tongue/throat swelling, SOB or lightheadedness with hypotension: Yes Has patient had a PCN reaction causing severe rash involving mucus membranes or skin necrosis: No Has patient had a PCN reaction that required hospitalization No Has patient had a PCN reaction occurring within the last 10 years: No If all of the above answers are "NO", then may proceed with Cephalosporin  . Sulfamethoxazole Rash    Social History   Social History  . Marital  status: Single    Spouse name: N/A  . Number of children: 3  . Years of education: 62   Occupational History  . retired    Social History Main Topics  . Smoking status: Current Every Day Smoker    Packs/day: 1.00    Years: 60.00    Types: Cigarettes  . Smokeless tobacco: Never Used     Comment: smoker since 76 yo.  1.5 ppd of [place] 100 lights. ; form given 10-23-16  . Alcohol use No  . Drug use: No     Comment: No hx of IV drug use  . Sexual activity: No   Other Topics Concern  . None   Social History Narrative   ** Merged History Encounter **       Lives in [place] with Daughter, granddaughters (twins) and mentally challenged niece.   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   Patient has a college education.   Patient drinks one cup of caffeine daily.                Vitals:   03/11/17 0841  BP: (!) 142/70  Pulse: 80  Resp: 16   Body mass index is 32.88 kg/m.   Physical Exam  Nursing note and vitals reviewed. Constitutional: She is oriented to person, place, and time. She appears well-developed. No distress.  HENT:  Head: Normocephalic and atraumatic.  Nose: Right sinus exhibits no maxillary sinus tenderness and no frontal sinus tenderness. Left sinus exhibits no maxillary sinus tenderness and no frontal sinus tenderness.  Mouth/Throat: Oropharynx is clear and moist and mucous membranes are normal. She has dentures.  Eyes: Pupils are equal, round, and reactive to light. Conjunctivae are normal.  Cardiovascular: Normal rate and regular rhythm.   Murmur (sof SEM LUSB) heard. Pulses:      Dorsalis pedis pulses are 2+ on the right side, and 2+ on the left side.  Respiratory: Effort normal and breath sounds normal. No respiratory distress.  GI: Soft. She exhibits no mass. There is no hepatomegaly. There is no tenderness.  Musculoskeletal: She exhibits no edema or tenderness.  Lymphadenopathy:    She has no cervical adenopathy.  Neurological: She  is alert and oriented to person, place, and time. She has normal strength. Coordination  normal.  Skin: Skin is warm. No rash noted. No erythema.  Hyperpigmented , rounded, macular lesion behind medial malleolus, left ankle. Lesion is not tender,indurated,or erythematous.  Psychiatric: She has a normal mood and affect.  Well groomed, good eye contact.     ASSESSMENT AND PLAN:   [name] was seen today for 3-4 months follow-up.  Diagnoses and all orders for this visit:  Chronic obstructive pulmonary disease, unspecified COPD type ([place])  Stable otherwise, recent exacerbation resolved. Atrovent and Albuterol inh to continue. Strongly recommend smoking cessation. Instructed about warning signs.  -     ipratropium (ATROVENT HFA) 17 MCG/ACT inhaler; Inhale 2 puffs into the lungs every 4 (four) hours as needed for wheezing. -     albuterol (PROVENTIL HFA;VENTOLIN HFA) 108 (90 Base) MCG/ACT inhaler; Inhale 2 puffs into the lungs every 4 (four) hours as needed for wheezing or shortness of breath.  Hypertensive renal disease, malignant, with renal failure   Today BP is adequately controlled. She is having hemodialysis today, so may need IVF after. No changes in current management. DASH -low salt diet recommended. Eye exam recommended at least once annually. F/U in 6 months, before if needed.  Recurrent major depressive disorder, in partial remission (HCC)   Stable. No changes in current management. F/U in 6 months.  TOBACCO ABUSE  We discussed adverse effects of tobacco use as well as benefits. She is not interested in quitting.  Allergic rhinitis, unspecified seasonality, unspecified trigger  Nasal irrigations with saline as needed. Astelin and Flonase nasal spray to use daily for 2-3 weeks then continue Flonase as needed. F/U as needed.  Skin burn  Healing well, now with residual post inflammatory pigmentation changes. F/U as needed.   -[name] was  advised to return sooner than planned today if new concerns arise.       Nakina Spatz G. [place], MD  [facility]. [place] office.

## 2017-03-11 ENCOUNTER — Ambulatory Visit (INDEPENDENT_AMBULATORY_CARE_PROVIDER_SITE_OTHER): Payer: Medicare Other | Admitting: Family Medicine

## 2017-03-11 ENCOUNTER — Encounter: Payer: Self-pay | Admitting: Family Medicine

## 2017-03-11 VITALS — BP 142/70 | HR 80 | Resp 16 | Ht 61.0 in | Wt 174.0 lb

## 2017-03-11 DIAGNOSIS — N2581 Secondary hyperparathyroidism of renal origin: Secondary | ICD-10-CM | POA: Diagnosis not present

## 2017-03-11 DIAGNOSIS — J309 Allergic rhinitis, unspecified: Secondary | ICD-10-CM

## 2017-03-11 DIAGNOSIS — J449 Chronic obstructive pulmonary disease, unspecified: Secondary | ICD-10-CM | POA: Diagnosis not present

## 2017-03-11 DIAGNOSIS — N186 End stage renal disease: Secondary | ICD-10-CM | POA: Diagnosis not present

## 2017-03-11 DIAGNOSIS — T300 Burn of unspecified body region, unspecified degree: Secondary | ICD-10-CM | POA: Diagnosis not present

## 2017-03-11 DIAGNOSIS — F17200 Nicotine dependence, unspecified, uncomplicated: Secondary | ICD-10-CM

## 2017-03-11 DIAGNOSIS — F3341 Major depressive disorder, recurrent, in partial remission: Secondary | ICD-10-CM

## 2017-03-11 DIAGNOSIS — I129 Hypertensive chronic kidney disease with stage 1 through stage 4 chronic kidney disease, or unspecified chronic kidney disease: Secondary | ICD-10-CM | POA: Diagnosis not present

## 2017-03-11 DIAGNOSIS — D631 Anemia in chronic kidney disease: Secondary | ICD-10-CM | POA: Diagnosis not present

## 2017-03-11 MED ORDER — ALBUTEROL SULFATE HFA 108 (90 BASE) MCG/ACT IN AERS: 2.0000 | INHALATION_SPRAY | RESPIRATORY_TRACT | 1 refills | Status: AC | PRN

## 2017-03-11 MED ORDER — IPRATROPIUM BROMIDE HFA 17 MCG/ACT IN AERS
2.0000 | INHALATION_SPRAY | RESPIRATORY_TRACT | 12 refills | Status: DC | PRN
Start: 2017-03-11 — End: 2017-08-04

## 2017-03-11 NOTE — Patient Instructions (Signed)
A few things to remember from today's visit:   Hypertensive renal disease, malignant, with renal failure  Recurrent major depressive disorder, in partial remission ([place])  TOBACCO ABUSE  No changes today.   Please be sure medication list is accurate. If a new problem present, please set up appointment sooner than planned today.

## 2017-03-13 DIAGNOSIS — N186 End stage renal disease: Secondary | ICD-10-CM | POA: Diagnosis not present

## 2017-03-13 DIAGNOSIS — D631 Anemia in chronic kidney disease: Secondary | ICD-10-CM | POA: Diagnosis not present

## 2017-03-13 DIAGNOSIS — N2581 Secondary hyperparathyroidism of renal origin: Secondary | ICD-10-CM | POA: Diagnosis not present

## 2017-03-15 ENCOUNTER — Encounter: Payer: Self-pay | Admitting: Family Medicine

## 2017-03-16 DIAGNOSIS — N2581 Secondary hyperparathyroidism of renal origin: Secondary | ICD-10-CM | POA: Diagnosis not present

## 2017-03-16 DIAGNOSIS — N186 End stage renal disease: Secondary | ICD-10-CM | POA: Diagnosis not present

## 2017-03-16 DIAGNOSIS — D631 Anemia in chronic kidney disease: Secondary | ICD-10-CM | POA: Diagnosis not present

## 2017-03-17 ENCOUNTER — Other Ambulatory Visit: Payer: Self-pay | Admitting: Nephrology

## 2017-03-17 DIAGNOSIS — R413 Other amnesia: Secondary | ICD-10-CM

## 2017-03-18 DIAGNOSIS — N186 End stage renal disease: Secondary | ICD-10-CM | POA: Diagnosis not present

## 2017-03-18 DIAGNOSIS — D631 Anemia in chronic kidney disease: Secondary | ICD-10-CM | POA: Diagnosis not present

## 2017-03-18 DIAGNOSIS — N2581 Secondary hyperparathyroidism of renal origin: Secondary | ICD-10-CM | POA: Diagnosis not present

## 2017-03-20 DIAGNOSIS — N2581 Secondary hyperparathyroidism of renal origin: Secondary | ICD-10-CM | POA: Diagnosis not present

## 2017-03-20 DIAGNOSIS — N186 End stage renal disease: Secondary | ICD-10-CM | POA: Diagnosis not present

## 2017-03-20 DIAGNOSIS — D631 Anemia in chronic kidney disease: Secondary | ICD-10-CM | POA: Diagnosis not present

## 2017-03-23 DIAGNOSIS — N186 End stage renal disease: Secondary | ICD-10-CM | POA: Diagnosis not present

## 2017-03-23 DIAGNOSIS — N2581 Secondary hyperparathyroidism of renal origin: Secondary | ICD-10-CM | POA: Diagnosis not present

## 2017-03-23 DIAGNOSIS — D631 Anemia in chronic kidney disease: Secondary | ICD-10-CM | POA: Diagnosis not present

## 2017-03-25 DIAGNOSIS — N186 End stage renal disease: Secondary | ICD-10-CM | POA: Diagnosis not present

## 2017-03-25 DIAGNOSIS — N2581 Secondary hyperparathyroidism of renal origin: Secondary | ICD-10-CM | POA: Diagnosis not present

## 2017-03-25 DIAGNOSIS — D631 Anemia in chronic kidney disease: Secondary | ICD-10-CM | POA: Diagnosis not present

## 2017-03-26 ENCOUNTER — Ambulatory Visit: Payer: Medicare Other | Admitting: Gastroenterology

## 2017-03-26 ENCOUNTER — Other Ambulatory Visit: Payer: Self-pay | Admitting: Family Medicine

## 2017-03-27 DIAGNOSIS — N2581 Secondary hyperparathyroidism of renal origin: Secondary | ICD-10-CM | POA: Diagnosis not present

## 2017-03-27 DIAGNOSIS — N186 End stage renal disease: Secondary | ICD-10-CM | POA: Diagnosis not present

## 2017-03-27 DIAGNOSIS — D631 Anemia in chronic kidney disease: Secondary | ICD-10-CM | POA: Diagnosis not present

## 2017-03-30 DIAGNOSIS — N2581 Secondary hyperparathyroidism of renal origin: Secondary | ICD-10-CM | POA: Diagnosis not present

## 2017-03-30 DIAGNOSIS — N186 End stage renal disease: Secondary | ICD-10-CM | POA: Diagnosis not present

## 2017-03-30 DIAGNOSIS — D631 Anemia in chronic kidney disease: Secondary | ICD-10-CM | POA: Diagnosis not present

## 2017-03-31 ENCOUNTER — Other Ambulatory Visit: Payer: Medicare Other

## 2017-04-01 DIAGNOSIS — E1129 Type 2 diabetes mellitus with other diabetic kidney complication: Secondary | ICD-10-CM | POA: Diagnosis not present

## 2017-04-01 DIAGNOSIS — N186 End stage renal disease: Secondary | ICD-10-CM | POA: Diagnosis not present

## 2017-04-01 DIAGNOSIS — Z992 Dependence on renal dialysis: Secondary | ICD-10-CM | POA: Diagnosis not present

## 2017-04-02 DIAGNOSIS — D631 Anemia in chronic kidney disease: Secondary | ICD-10-CM | POA: Diagnosis not present

## 2017-04-02 DIAGNOSIS — N186 End stage renal disease: Secondary | ICD-10-CM | POA: Diagnosis not present

## 2017-04-02 DIAGNOSIS — N2581 Secondary hyperparathyroidism of renal origin: Secondary | ICD-10-CM | POA: Diagnosis not present

## 2017-04-03 DIAGNOSIS — D631 Anemia in chronic kidney disease: Secondary | ICD-10-CM | POA: Diagnosis not present

## 2017-04-03 DIAGNOSIS — N186 End stage renal disease: Secondary | ICD-10-CM | POA: Diagnosis not present

## 2017-04-03 DIAGNOSIS — N2581 Secondary hyperparathyroidism of renal origin: Secondary | ICD-10-CM | POA: Diagnosis not present

## 2017-04-06 DIAGNOSIS — N2581 Secondary hyperparathyroidism of renal origin: Secondary | ICD-10-CM | POA: Diagnosis not present

## 2017-04-06 DIAGNOSIS — N186 End stage renal disease: Secondary | ICD-10-CM | POA: Diagnosis not present

## 2017-04-06 DIAGNOSIS — D631 Anemia in chronic kidney disease: Secondary | ICD-10-CM | POA: Diagnosis not present

## 2017-04-08 DIAGNOSIS — N2581 Secondary hyperparathyroidism of renal origin: Secondary | ICD-10-CM | POA: Diagnosis not present

## 2017-04-08 DIAGNOSIS — D631 Anemia in chronic kidney disease: Secondary | ICD-10-CM | POA: Diagnosis not present

## 2017-04-08 DIAGNOSIS — N186 End stage renal disease: Secondary | ICD-10-CM | POA: Diagnosis not present

## 2017-04-10 DIAGNOSIS — N186 End stage renal disease: Secondary | ICD-10-CM | POA: Diagnosis not present

## 2017-04-10 DIAGNOSIS — N2581 Secondary hyperparathyroidism of renal origin: Secondary | ICD-10-CM | POA: Diagnosis not present

## 2017-04-10 DIAGNOSIS — D631 Anemia in chronic kidney disease: Secondary | ICD-10-CM | POA: Diagnosis not present

## 2017-04-13 DIAGNOSIS — D631 Anemia in chronic kidney disease: Secondary | ICD-10-CM | POA: Diagnosis not present

## 2017-04-13 DIAGNOSIS — N2581 Secondary hyperparathyroidism of renal origin: Secondary | ICD-10-CM | POA: Diagnosis not present

## 2017-04-13 DIAGNOSIS — N186 End stage renal disease: Secondary | ICD-10-CM | POA: Diagnosis not present

## 2017-04-15 DIAGNOSIS — N186 End stage renal disease: Secondary | ICD-10-CM | POA: Diagnosis not present

## 2017-04-15 DIAGNOSIS — N2581 Secondary hyperparathyroidism of renal origin: Secondary | ICD-10-CM | POA: Diagnosis not present

## 2017-04-15 DIAGNOSIS — D631 Anemia in chronic kidney disease: Secondary | ICD-10-CM | POA: Diagnosis not present

## 2017-04-17 DIAGNOSIS — D631 Anemia in chronic kidney disease: Secondary | ICD-10-CM | POA: Diagnosis not present

## 2017-04-17 DIAGNOSIS — N186 End stage renal disease: Secondary | ICD-10-CM | POA: Diagnosis not present

## 2017-04-17 DIAGNOSIS — N2581 Secondary hyperparathyroidism of renal origin: Secondary | ICD-10-CM | POA: Diagnosis not present

## 2017-04-19 DIAGNOSIS — N186 End stage renal disease: Secondary | ICD-10-CM | POA: Diagnosis not present

## 2017-04-19 DIAGNOSIS — D631 Anemia in chronic kidney disease: Secondary | ICD-10-CM | POA: Diagnosis not present

## 2017-04-19 DIAGNOSIS — N2581 Secondary hyperparathyroidism of renal origin: Secondary | ICD-10-CM | POA: Diagnosis not present

## 2017-04-21 DIAGNOSIS — D631 Anemia in chronic kidney disease: Secondary | ICD-10-CM | POA: Diagnosis not present

## 2017-04-21 DIAGNOSIS — N186 End stage renal disease: Secondary | ICD-10-CM | POA: Diagnosis not present

## 2017-04-21 DIAGNOSIS — N2581 Secondary hyperparathyroidism of renal origin: Secondary | ICD-10-CM | POA: Diagnosis not present

## 2017-04-24 DIAGNOSIS — N186 End stage renal disease: Secondary | ICD-10-CM | POA: Diagnosis not present

## 2017-04-24 DIAGNOSIS — D631 Anemia in chronic kidney disease: Secondary | ICD-10-CM | POA: Diagnosis not present

## 2017-04-24 DIAGNOSIS — N2581 Secondary hyperparathyroidism of renal origin: Secondary | ICD-10-CM | POA: Diagnosis not present

## 2017-04-27 DIAGNOSIS — D631 Anemia in chronic kidney disease: Secondary | ICD-10-CM | POA: Diagnosis not present

## 2017-04-27 DIAGNOSIS — N2581 Secondary hyperparathyroidism of renal origin: Secondary | ICD-10-CM | POA: Diagnosis not present

## 2017-04-27 DIAGNOSIS — N186 End stage renal disease: Secondary | ICD-10-CM | POA: Diagnosis not present

## 2017-04-29 DIAGNOSIS — D631 Anemia in chronic kidney disease: Secondary | ICD-10-CM | POA: Diagnosis not present

## 2017-04-29 DIAGNOSIS — N2581 Secondary hyperparathyroidism of renal origin: Secondary | ICD-10-CM | POA: Diagnosis not present

## 2017-04-29 DIAGNOSIS — N186 End stage renal disease: Secondary | ICD-10-CM | POA: Diagnosis not present

## 2017-05-01 DIAGNOSIS — N186 End stage renal disease: Secondary | ICD-10-CM | POA: Diagnosis not present

## 2017-05-01 DIAGNOSIS — E1129 Type 2 diabetes mellitus with other diabetic kidney complication: Secondary | ICD-10-CM | POA: Diagnosis not present

## 2017-05-01 DIAGNOSIS — N2581 Secondary hyperparathyroidism of renal origin: Secondary | ICD-10-CM | POA: Diagnosis not present

## 2017-05-01 DIAGNOSIS — D631 Anemia in chronic kidney disease: Secondary | ICD-10-CM | POA: Diagnosis not present

## 2017-05-01 DIAGNOSIS — Z992 Dependence on renal dialysis: Secondary | ICD-10-CM | POA: Diagnosis not present

## 2017-05-04 DIAGNOSIS — D631 Anemia in chronic kidney disease: Secondary | ICD-10-CM | POA: Diagnosis not present

## 2017-05-04 DIAGNOSIS — N186 End stage renal disease: Secondary | ICD-10-CM | POA: Diagnosis not present

## 2017-05-04 DIAGNOSIS — N2581 Secondary hyperparathyroidism of renal origin: Secondary | ICD-10-CM | POA: Diagnosis not present

## 2017-05-06 DIAGNOSIS — D631 Anemia in chronic kidney disease: Secondary | ICD-10-CM | POA: Diagnosis not present

## 2017-05-06 DIAGNOSIS — N2581 Secondary hyperparathyroidism of renal origin: Secondary | ICD-10-CM | POA: Diagnosis not present

## 2017-05-06 DIAGNOSIS — N186 End stage renal disease: Secondary | ICD-10-CM | POA: Diagnosis not present

## 2017-05-08 DIAGNOSIS — N186 End stage renal disease: Secondary | ICD-10-CM | POA: Diagnosis not present

## 2017-05-08 DIAGNOSIS — D631 Anemia in chronic kidney disease: Secondary | ICD-10-CM | POA: Diagnosis not present

## 2017-05-08 DIAGNOSIS — N2581 Secondary hyperparathyroidism of renal origin: Secondary | ICD-10-CM | POA: Diagnosis not present

## 2017-05-11 DIAGNOSIS — I10 Essential (primary) hypertension: Secondary | ICD-10-CM | POA: Diagnosis not present

## 2017-05-11 DIAGNOSIS — N179 Acute kidney failure, unspecified: Secondary | ICD-10-CM | POA: Diagnosis not present

## 2017-05-11 DIAGNOSIS — D631 Anemia in chronic kidney disease: Secondary | ICD-10-CM | POA: Diagnosis not present

## 2017-05-11 DIAGNOSIS — N186 End stage renal disease: Secondary | ICD-10-CM | POA: Diagnosis not present

## 2017-05-11 DIAGNOSIS — R5381 Other malaise: Secondary | ICD-10-CM | POA: Diagnosis not present

## 2017-05-11 DIAGNOSIS — N2581 Secondary hyperparathyroidism of renal origin: Secondary | ICD-10-CM | POA: Diagnosis not present

## 2017-05-12 ENCOUNTER — Ambulatory Visit: Payer: Self-pay

## 2017-05-12 NOTE — Telephone Encounter (Signed)
  Pt calling c/o dizziness, weakness since July. Pt states PT worked with her for 6 weeks discharge. Pt c/o feeling nervous and jittery then has to sit down. She states she slipped and fell 12/7. Says that she cannot subtract anymore for the last week. She said she discussed her symptoms with her hemodialysis doctor and she said he felt she needs to call to get appt to be evaluated. Appt made for 05/15/16 @0715  with PCP Reason for Disposition . [1] MODERATE dizziness (e.g., interferes with normal activities) AND [2] has been evaluated by physician for this  Answer Assessment - Initial Assessment Questions 1. DESCRIPTION: "Describe your dizziness."     Have weakness and dizziness upon standing. Cannot stand more than 5-10 minutes then must sit down. Has been feeling like this since being hospitalized in July. Takes 2-3 minutes to find balance then may walk 50 feet then has to stop and rest before you can go on. 2. LIGHTHEADED: "Do you feel lightheaded?" (e.g., somewhat faint, woozy, weak upon standing)     Says she feels weak upon standing and occasionally lightheadedness  Sates she feels jittery, nervous. 3. VERTIGO: "Do you feel like either you or the room is spinning or tilting?" (i.e. vertigo)     No vertigo 4. SEVERITY: "How bad is it?"  "Do you feel like you are going to faint?" "Can you stand and walk?"   - MILD - walking normally   - MODERATE - interferes with normal activities (e.g., work, school)    - SEVERE - unable to stand, requires support to walk, feels like passing out now.  Moderate 5. ONSET:  "When did the dizziness begin?" Since July 6. AGGRAVATING FACTORS: "Does anything make it worse?" (e.g., standing, change in head position) Standing walking,  7. HEART RATE: "Can you tell me your heart rate?" "How many beats in 15 seconds?"  (Note: not all patients can do this)       64 8. CAUSE: "What do you think is causing the dizziness?"     Pt states that she is afraid that she has  had a stroke and it has not been detected. Per pt has had 2 "mild strokes" 9. RECURRENT SYMPTOM: "Have you had dizziness before?" If so, ask: "When was the last time?" "What happened that time?"     Not before July.  10. OTHER SYMPTOMS: "Do you have any other symptoms?" (e.g., fever, chest pain, vomiting, diarrhea, bleeding)       Nausea once a week 11. PREGNANCY: "Is there any chance you are pregnant?" "When was your last menstrual period?"       n/a  Protocols used: DIZZINESS [facility]

## 2017-05-13 DIAGNOSIS — D631 Anemia in chronic kidney disease: Secondary | ICD-10-CM | POA: Diagnosis not present

## 2017-05-13 DIAGNOSIS — N2581 Secondary hyperparathyroidism of renal origin: Secondary | ICD-10-CM | POA: Diagnosis not present

## 2017-05-13 DIAGNOSIS — N186 End stage renal disease: Secondary | ICD-10-CM | POA: Diagnosis not present

## 2017-05-14 NOTE — Progress Notes (Signed)
ACUTE VISIT   HPI:  Chief Complaint  Patient presents with  . Fatigue    with ambulation  . Dizziness    with ambulation    [name] is a 76 y.o. female, who is here today complaining of dizziness and weakness: "my whole body is weak."  She has Hx of ESRD, she is on hemodialysis 3 times per week. Chronic back pain (epidural injection 10/2016) and peripheral neuropathy. She follows with neurologist, Dr Jannifer Franklin.  She called on 05/12/17 c/o dizziness and weakness since hospital discharge in 11/2016. She feels LE weakness, L>R. Denies erythema, worsening edema,or pain. Completed PT, which helped but she goes back to where she was right after hospital discharge. She is doing some of the PT exercises, those that she can do.  "Stamble all the time", she has to hold on walls to prevent falls.  According to pt, her nephrologist instructed to ask me about the possibility of having a "stroke" in 11/2016 that caused all these symptoms. She states that she cannot longer "subtract numbers anymore", she noted this 3-4 weeks ago. "Sometimes" she cannot finish a sentence because she has trouble finding words.  Burning and tingling on LE stable.  Intermittent dizziness and "weakness" , which usually happens when she tries to do things or chores around the house. If she avoids aggravating factors she does not have dizziness, it happens "15-20 % of the day."  Fall on 05/08/17, she slid on pavement and was sore for 7-10 days.  She is on Lyrica 100 mg at bedtime and Cymbalta 60 mg daily (decreased from 90 mg).  Lower back pain exacerbated by standing up and prolonged standing/walking. Alleviated by rest.  Lumbar MRI 01/18/17:  Degenerative changes as detailed above that did not lead to any nerve root compression. The most significant findings are: 1.    At L2-L3 and L3-L4, there is mild spinal stenosis.   There does not appear to be any nerve root compression. 2.   At L4-L5, there  is borderline spinal stenosis is not appear to be any nerve root compression. 3.   Compared to the MRI dated 05/12/2015, there is no significant interval change.  Denies saddle anesthesia or changes in bowel continence (Hx of fecal incontinence) or urine incontinence.  HTN on Metoprolol Succinate 200 mg , Clonidine 0.2 mg bid,Hydralazine 10 mg bid ,Amlodipine 10 mg daily, and Cozzar 100 mg daily.  BP's before dialysis 160's/90. Denies headache, visual changes, chest pain, dyspnea, palpitation, focal weakness, or edema. She is producing urine intermittently, denies gross hematuria or dysuria.  Hx of depression and anxiety. + Stress. She is her niece's caregiver (Hx of mental retardation). She denies suicidal thoughts. + Insomnia, wakes up every 1-2 hours. She was on Abilify, which was discontinued in 11/2016. Smoking helps with anxiety.    Review of Systems  Constitutional: Positive for activity change and fatigue. Negative for appetite change and fever.  HENT: Negative for mouth sores, nosebleeds and trouble swallowing.   Eyes: Negative for redness and visual disturbance.  Respiratory: Negative for cough, shortness of breath and wheezing.   Cardiovascular: Negative for chest pain, palpitations and leg swelling.  Gastrointestinal: Negative for abdominal pain, nausea and vomiting.       Negative for changes in bowel habits.  Endocrine: Negative for cold intolerance and heat intolerance.  Genitourinary: Negative for dysuria, hematuria and pelvic pain.  Musculoskeletal: Positive for back pain and gait problem. Negative for myalgias.  Skin: Negative  for rash and wound.  Neurological: Positive for dizziness. Negative for seizures, syncope, weakness and headaches.  Psychiatric/Behavioral: Positive for sleep disturbance (Waking up "almost every hour", stable.). Negative for hallucinations. The patient is nervous/anxious.       Current Outpatient Medications on File Prior to Visit    Medication Sig Dispense Refill  . acetaminophen (TYLENOL) 500 MG tablet Take 1,000 mg by mouth every 6 (six) hours as needed for mild pain or moderate pain.    Marland Kitchen albuterol (PROVENTIL HFA;VENTOLIN HFA) 108 (90 Base) MCG/ACT inhaler Inhale 2 puffs into the lungs every 4 (four) hours as needed for wheezing or shortness of breath. 1 Inhaler 1  . allopurinol (ZYLOPRIM) 100 MG tablet Take 100 mg by mouth once daily on non-dialysis days (Tues/Thurs/Sat/Sun) (Patient taking differently: Take 100 mg by mouth See admin instructions. Take one tablet (100 mg) by mouth once daily on non-dialysis days (Tues/Thurs/Sat/Sun)) 30 tablet 0  . amLODipine (NORVASC) 10 MG tablet Take 1 tablet (10 mg total) by mouth at bedtime. 90 tablet 1  . aspirin EC 81 MG tablet Take 81 mg by mouth daily.     Marland Kitchen atorvastatin (LIPITOR) 40 MG tablet Take 1 tablet (40 mg total) by mouth daily. (Patient taking differently: Take 40 mg by mouth at bedtime. ) 90 tablet 3  . azelastine (ASTELIN) 0.1 % nasal spray Place 1 spray into both nostrils 2 (two) times daily. Use in each nostril as directed (Patient taking differently: Place 1 spray into both nostrils 2 (two) times daily as needed for rhinitis or allergies. Use in each nostril as directed) 30 mL 6  . cinacalcet (SENSIPAR) 90 MG tablet Take 90 mg by mouth at bedtime.    . cloNIDine (CATAPRES) 0.2 MG tablet Take 1 tablet (0.2 mg total) by mouth 2 (two) times daily. (Patient taking differently: Take 0.3 mg by mouth 3 (three) times daily. )    . DULoxetine (CYMBALTA) 30 MG capsule Take 2 capsules (60 mg total) by mouth daily. 180 capsule 3  . fluticasone (FLONASE) 50 MCG/ACT nasal spray Place 2 sprays into both nostrils daily as needed for allergies or rhinitis. 16 g 3  . Homeopathic Products (LEG CRAMP RELIEF PO) Take 1-2 tablets by mouth daily as needed (for leg pain).     . hydrALAZINE (APRESOLINE) 10 MG tablet Take 1 tablet (10 mg total) by mouth 2 (two) times daily. 180 tablet 1  .  ipratropium (ATROVENT HFA) 17 MCG/ACT inhaler Inhale 2 puffs into the lungs every 4 (four) hours as needed for wheezing. 1 Inhaler 12  . lidocaine-prilocaine (EMLA) cream Apply 1 application topically See admin instructions. Apply topically one hour before dialysis - Monday, Wednesday, Friday    . loratadine (CLARITIN) 10 MG tablet Take 1 tablet (10 mg total) by mouth daily as needed for allergies. 90 tablet 3  . losartan (COZAAR) 100 MG tablet Take 1 tablet (100 mg total) by mouth daily. 90 tablet 1  . metoprolol succinate (TOPROL-XL) 100 MG 24 hr tablet TAKE 1 TABLET BY MOUTH TWICE DAILY 180 tablet 1  . multivitamin (RENA-VIT) TABS tablet Take 1 tablet by mouth at bedtime. (Patient taking differently: Take 1 tablet by mouth daily. ) 60 tablet 5  . omeprazole (PRILOSEC) 40 MG capsule Take 1 capsule (40 mg total) by mouth 2 (two) times daily. 60 capsule 11  . pregabalin (LYRICA) 100 MG capsule Take 100 mg by mouth at bedtime.     . sevelamer carbonate (RENVELA) 800 MG tablet Take  2,400 mg by mouth See admin instructions. Take 3 tablets (2400 mg) by mouth with each meal - 4 times daily     No current facility-administered medications on file prior to visit.      Past Medical History:  Diagnosis Date  . Abnormality of gait 03/28/2015  . Adrenal mass ([place])   . 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 Arthritis   . Back pain   . CHF (congestive heart failure) ([place])   . Chronic kidney disease    Hemo MWF  . Congestion of throat    Pt states she has a lot mucus in back throat.  . Constipation   . COPD (chronic obstructive pulmonary disease) ([place])   . Depression   . Dialysis patient [facility])    kidney  . Diverticulitis   . GERD (gastroesophageal reflux disease)   . H/O hiatal hernia   . High cholesterol   . Hoarseness of voice   . Hyperlipidemia   . Hypertension   . IBS (irritable bowel syndrome)   . Meralgia paresthetica of right side 12/26/2014  .  Normal cardiac stress test 12/24/2009   lexiscan, imaging normal  . Renal disorder   . RLS (restless legs syndrome)   . Seizures ([place])    2004 past brain surgery  . Sinus complaint   . Thyroid disease   . TIA (transient ischemic attack)   . Tubular adenoma of colon 01/2008   Allergies  Allergen Reactions  . Tuberculin Tests Hives    "blisters"  . Valacyclovir Other (See Comments)    Confusion and nervousness  . Codeine Nausea And Vomiting  . Penicillins Rash    No problems breathing. Has tolerated omnicef in past without issue Has patient had a PCN reaction causing immediate rash, facial/tongue/throat swelling, SOB or lightheadedness with hypotension: Yes Has patient had a PCN reaction causing severe rash involving mucus membranes or skin necrosis: No Has patient had a PCN reaction that required hospitalization No Has patient had a PCN reaction occurring within the last 10 years: No If all of the above answers are "NO", then may proceed with Cephalosporin  . Sulfamethoxazole Rash    Social History   Socioeconomic History  . Marital status: Single    Spouse name: None  . Number of children: 3  . Years of education: 58  . Highest education level: None  Social Needs  . Financial resource strain: None  . Food insecurity - worry: None  . Food insecurity - inability: None  . Transportation needs - medical: None  . Transportation needs - non-medical: None  Occupational History  . Occupation: retired  Tobacco Use  . Smoking status: Current Every Day Smoker    Packs/day: 1.00    Years: 60.00    Pack years: 60.00    Types: Cigarettes  . Smokeless tobacco: Never Used  . Tobacco comment: smoker since 76 yo.  1.5 ppd of [place] 100 lights. ; form given 10-23-16  Substance and Sexual Activity  . Alcohol use: No    Alcohol/week: 0.0 oz  . Drug use: No    Comment: No hx of IV drug use  . Sexual activity: No    Birth control/protection: None  Other Topics Concern  . None    Social History Narrative   ** Merged History Encounter **       Lives in [place] with Daughter, granddaughters (twins) and mentally challenged niece.   Retired  Engineer, maintenance ([employer]).   Patient is right-handed.   Patient has a college education.   Patient drinks one cup of caffeine daily.             Vitals:   05/15/17 0718  BP: 130/82  Pulse: 77  Resp: 16  Temp: 98.2 F (36.8 C)  SpO2: 99%   Body mass index is 31.77 kg/m.   Physical Exam  Nursing note and vitals reviewed. Constitutional: She is oriented to person, place, and time. She appears well-developed. No distress.  HENT:  Head: Normocephalic and atraumatic.  Mouth/Throat: Oropharynx is clear and moist and mucous membranes are normal.  Eyes: Conjunctivae are normal. Pupils are equal, round, and reactive to light.  Cardiovascular: Normal rate and regular rhythm.  Murmur (SEM I/VI RUSB and LUSB ) heard. Pulses:      Dorsalis pedis pulses are 2+ on the right side, and 2+ on the left side.  Respiratory: Effort normal and breath sounds normal. No respiratory distress.  GI: Soft. She exhibits no mass. There is no tenderness.  Musculoskeletal: She exhibits no edema.  Lymphadenopathy:    She has no cervical adenopathy.  Neurological: She is alert and oriented to person, place, and time. Gait abnormal.  No focal deficit appreciated. Unstable gait, assisted with cane.  Skin: Skin is warm. No rash noted. No erythema.  Psychiatric: Her mood appears anxious.  Well groomed, good eye contact.    ASSESSMENT AND PLAN:   Ms. Sadiyah was seen today for fatigue and dizziness.  Diagnoses and all orders for this visit:  Unstable gait  Chronic. Fall prevention discussed. Home PT will be arranged. Some of her medications and chronic medical problems could aggravate problem.  -     Ambulatory referral to Home Health  Hypertensive renal disease, malignant, with renal failure  Adequately controlled. No changes in current  management. DASH-low salt diet recommended.  Generalized weakness  No focal deficit appreciated on exam. Some possible etiologies of fatigue discuss, a few of her chronic medical problems could be causing/aggrvating problem.  -     Ambulatory referral to [place] finding difficulty  We discussed possible causes,including depression,medications like Lyrica among some. Brain MRI order was placed by Dr Marval Regal ,she cancelled appt. She thought she was getting RLE X ray and cancelled appt because pain resolved. She has an appt with neuro 05/19/17. Instructed about warning signs.  Hereditary and idiopathic peripheral neuropathy  Continuing to unstable gait. No changes in Cymbalta or Lyrica. Some side effects discussed.  Recurrent major depressive disorder, in partial remission (HCC)  No changes in Cymbalta. May consider Seroquel at bedtime. Instructed about warning signs. F/U in 3 months.   Return in about 3 months (around 08/13/2017).     -[name] was advised to seek immediate medical attention if sudden worsening symptoms.     Nicholi Ghuman G. [place], MD  [facility]. [place] office.

## 2017-05-15 ENCOUNTER — Encounter: Payer: Self-pay | Admitting: Family Medicine

## 2017-05-15 ENCOUNTER — Ambulatory Visit (INDEPENDENT_AMBULATORY_CARE_PROVIDER_SITE_OTHER): Payer: Medicare Other | Admitting: Family Medicine

## 2017-05-15 VITALS — BP 130/82 | HR 77 | Temp 98.2°F | Resp 16 | Ht 61.0 in | Wt 168.1 lb

## 2017-05-15 DIAGNOSIS — R4789 Other speech disturbances: Secondary | ICD-10-CM | POA: Diagnosis not present

## 2017-05-15 DIAGNOSIS — N186 End stage renal disease: Secondary | ICD-10-CM | POA: Diagnosis not present

## 2017-05-15 DIAGNOSIS — D631 Anemia in chronic kidney disease: Secondary | ICD-10-CM | POA: Diagnosis not present

## 2017-05-15 DIAGNOSIS — R2681 Unsteadiness on feet: Secondary | ICD-10-CM | POA: Diagnosis not present

## 2017-05-15 DIAGNOSIS — I129 Hypertensive chronic kidney disease with stage 1 through stage 4 chronic kidney disease, or unspecified chronic kidney disease: Secondary | ICD-10-CM | POA: Diagnosis not present

## 2017-05-15 DIAGNOSIS — R531 Weakness: Secondary | ICD-10-CM | POA: Diagnosis not present

## 2017-05-15 DIAGNOSIS — G609 Hereditary and idiopathic neuropathy, unspecified: Secondary | ICD-10-CM

## 2017-05-15 DIAGNOSIS — F3341 Major depressive disorder, recurrent, in partial remission: Secondary | ICD-10-CM | POA: Diagnosis not present

## 2017-05-15 DIAGNOSIS — N2581 Secondary hyperparathyroidism of renal origin: Secondary | ICD-10-CM | POA: Diagnosis not present

## 2017-05-15 NOTE — Patient Instructions (Signed)
A few things to remember from today's visit:   Hypertensive renal disease, malignant, with renal failure  Hereditary and idiopathic peripheral neuropathy  Unstable gait  Fall prevention is the main goal now. Home health will be arranged. No changes in meds for now. Keep appointment with Dr Jannifer Franklin.   Fall Prevention in the Home Falls can cause injuries. They can happen to people of all ages. There are many things you can do to make your home safe and to help prevent falls. What can I do on the outside of my home?  Regularly fix the edges of walkways and driveways and fix any cracks.  Remove anything that might make you trip as you walk through a door, such as a raised step or threshold.  Trim any bushes or trees on the path to your home.  Use bright outdoor lighting.  Clear any walking paths of anything that might make someone trip, such as rocks or tools.  Regularly check to see if handrails are loose or broken. Make sure that both sides of any steps have handrails.  Any raised decks and porches should have guardrails on the edges.  Have any leaves, snow, or ice cleared regularly.  Use sand or salt on walking paths during winter.  Clean up any spills in your garage right away. This includes oil or grease spills. What can I do in the bathroom?  Use night lights.  Install grab bars by the toilet and in the tub and shower. Do not use towel bars as grab bars.  Use non-skid mats or decals in the tub or shower.  If you need to sit down in the shower, use a plastic, non-slip stool.  Keep the floor dry. Clean up any water that spills on the floor as soon as it happens.  Remove soap buildup in the tub or shower regularly.  Attach bath mats securely with double-sided non-slip rug tape.  Do not have throw rugs and other things on the floor that can make you trip. What can I do in the bedroom?  Use night lights.  Make sure that you have a light by your bed that is easy  to reach.  Do not use any sheets or blankets that are too big for your bed. They should not hang down onto the floor.  Have a firm chair that has side arms. You can use this for support while you get dressed.  Do not have throw rugs and other things on the floor that can make you trip. What can I do in the kitchen?  Clean up any spills right away.  Avoid walking on wet floors.  Keep items that you use a lot in easy-to-reach places.  If you need to reach something above you, use a strong step stool that has a grab bar.  Keep electrical cords out of the way.  Do not use floor polish or wax that makes floors slippery. If you must use wax, use non-skid floor wax.  Do not have throw rugs and other things on the floor that can make you trip. What can I do with my stairs?  Do not leave any items on the stairs.  Make sure that there are handrails on both sides of the stairs and use them. Fix handrails that are broken or loose. Make sure that handrails are as long as the stairways.  Check any carpeting to make sure that it is firmly attached to the stairs. Fix any carpet that is loose or  worn.  Avoid having throw rugs at the top or bottom of the stairs. If you do have throw rugs, attach them to the floor with carpet tape.  Make sure that you have a light switch at the top of the stairs and the bottom of the stairs. If you do not have them, ask someone to add them for you. What else can I do to help prevent falls?  Wear shoes that: ? Do not have high heels. ? Have rubber bottoms. ? Are comfortable and fit you well. ? Are closed at the toe. Do not wear sandals.  If you use a stepladder: ? Make sure that it is fully opened. Do not climb a closed stepladder. ? Make sure that both sides of the stepladder are locked into place. ? Ask someone to hold it for you, if possible.  Clearly mark and make sure that you can see: ? Any grab bars or handrails. ? First and last steps. ? Where  the edge of each step is.  Use tools that help you move around (mobility aids) if they are needed. These include: ? Canes. ? Walkers. ? Scooters. ? Crutches.  Turn on the lights when you go into a dark area. Replace any light bulbs as soon as they burn out.  Set up your furniture so you have a clear path. Avoid moving your furniture around.  If any of your floors are uneven, fix them.  If there are any pets around you, be aware of where they are.  Review your medicines with your doctor. Some medicines can make you feel dizzy. This can increase your chance of falling. Ask your doctor what other things that you can do to help prevent falls. This information is not intended to replace advice given to you by your health care provider. Make sure you discuss any questions you have with your health care provider. Document Released: 03/15/2009 Document Revised: 10/25/2015 Document Reviewed: 06/23/2014 Elsevier Interactive Patient Education  [employer].   Please be sure medication list is accurate. If a new problem present, please set up appointment sooner than planned today.

## 2017-05-18 DIAGNOSIS — D631 Anemia in chronic kidney disease: Secondary | ICD-10-CM | POA: Diagnosis not present

## 2017-05-18 DIAGNOSIS — N186 End stage renal disease: Secondary | ICD-10-CM | POA: Diagnosis not present

## 2017-05-18 DIAGNOSIS — N2581 Secondary hyperparathyroidism of renal origin: Secondary | ICD-10-CM | POA: Diagnosis not present

## 2017-05-19 ENCOUNTER — Encounter: Payer: Self-pay | Admitting: Adult Health

## 2017-05-19 ENCOUNTER — Ambulatory Visit (INDEPENDENT_AMBULATORY_CARE_PROVIDER_SITE_OTHER): Payer: Medicare Other | Admitting: Adult Health

## 2017-05-19 VITALS — BP 165/56 | HR 70 | Ht 61.0 in | Wt 168.2 lb

## 2017-05-19 DIAGNOSIS — M6281 Muscle weakness (generalized): Secondary | ICD-10-CM | POA: Diagnosis not present

## 2017-05-19 DIAGNOSIS — G609 Hereditary and idiopathic neuropathy, unspecified: Secondary | ICD-10-CM | POA: Diagnosis not present

## 2017-05-19 DIAGNOSIS — R413 Other amnesia: Secondary | ICD-10-CM

## 2017-05-19 NOTE — Progress Notes (Signed)
I have read the note, and I agree with the clinical assessment and plan.  Mahitha Hickling K Riggin Cuttino   

## 2017-05-19 NOTE — Patient Instructions (Signed)
Your Plan:  MRI brain to evaluate for word finding issues and weakness If your symptoms worsen or you develop new symptoms please let us know.   Thank you for coming to see [place] at [facility]. I hope we have been able to provide you high quality care today.  You may receive a patient satisfaction survey over the next few weeks. We would appreciate your feedback and comments so that we may continue to improve ourselves and the health of our patients.

## 2017-05-19 NOTE — Progress Notes (Signed)
PATIENT: [name] DOB: Oct 15, 1940  REASON FOR VISIT: follow up HISTORY FROM: patient  HISTORY OF PRESENT ILLNESS: Today 05/19/17 [name] is a 76 year old female with a history of chronic back pain and peripheral neuropathy.  She returns today for an evaluation.  At the last visit with Dr. Jannifer Franklin she reported weakness in the lower extremities and trouble ambulating.  A MRI of the lumbar spine was completed that did not show any changes that could be the result of her symptoms.  The patient was scheduled for nerve conduction studies but she canceled her appointment because they "hurt so bad" the first time.  She states that since her visit her ambulation has not gotten any better.  She states that she typically has spells throughout the morning that she describes as generalized weakness, feeling as if she is going to collapse.  She states that the weakness is not only in her legs but in her arms and even her head.  She states that she does not feel dizzy but rather her head feels heavy.  She states that it can last approximately 15-20 minutes and then she may feel better for 30 minutes and then she will have another spell.  She states that all of the symptoms started after her hospitalization in July from pneumonia.  She was only in the hospital 1-1/2 days.  She states that she is also been having word finding difficulty.  States that she can be in mid conversation she did discuss this with her nephrologist the patient reports that she never scheduled this although appears the appointment was canceled.  She also followed up with her primary care who also noted that the MRI was canceled patient returns today for an evaluation.  HISTORY 01/08/17: [name] is a 76 year old right-handed black female with a history of end-stage renal disease on hemodialysis, she has a history of chronic low back pain and peripheral neuropathy associated with a gait disorder. The patient has undergone an epidural  steroid injection that occurred around 11/19/2016. The patient went into the hospital around 12/06/2016 with a pneumonia. Since that time, she has not been able to walk well with some reports of weakness of the legs, she feels that the left leg is weaker than the right. At nighttime, she may have jerking and twitching of the legs that keep her awake. She is on Lyrica taking 100 mg at night. She takes Cymbalta which was recently increased from 60 to 120 mg daily. The patient denies any pain in the back or down the legs, she did have some discomfort with the epidural injection during the procedure. The patient has not noted any change in the way the bowels or the bladder are working. She does not believe there has been any progress in her ability to walk since onset, she now is getting physical therapy. The patient went to the hospital on 12/22/2016 because of the leg weakness, she spent several days in the hospital, it was felt that she was deconditioned from the pneumonia. She comes to the office today for further evaluation. She denies any dizziness when she stands, she feels as if the legs will collapse, however. Her blood pressure medication, clonidine was recently increased to 0.3 mg 3 times daily.   REVIEW OF SYSTEMS: Out of a complete 14 system review of symptoms, the patient complains only of the following symptoms, and all other reviewed systems are negative.  Appetite change, unexpected weight change, light sensitivity, runny nose, nausea,  incontinence of bowels, restless leg, insomnia, frequent waking, daytime sleepiness, muscle cramps, walking difficulty, neck stiffness, weakness, headache  ALLERGIES: Allergies  Allergen Reactions  . Tuberculin Tests Hives    "blisters"  . Valacyclovir Other (See Comments)    Confusion and nervousness  . Codeine Nausea And Vomiting  . Penicillins Rash    No problems breathing. Has tolerated omnicef in past without issue Has patient had a PCN reaction  causing immediate rash, facial/tongue/throat swelling, SOB or lightheadedness with hypotension: Yes Has patient had a PCN reaction causing severe rash involving mucus membranes or skin necrosis: No Has patient had a PCN reaction that required hospitalization No Has patient had a PCN reaction occurring within the last 10 years: No If all of the above answers are "NO", then may proceed with Cephalosporin  . Sulfamethoxazole Rash    HOME MEDICATIONS: Outpatient Medications Prior to Visit  Medication Sig Dispense Refill  . acetaminophen (TYLENOL) 500 MG tablet Take 1,000 mg by mouth every 6 (six) hours as needed for mild pain or moderate pain.    Marland Kitchen albuterol (PROVENTIL HFA;VENTOLIN HFA) 108 (90 Base) MCG/ACT inhaler Inhale 2 puffs into the lungs every 4 (four) hours as needed for wheezing or shortness of breath. 1 Inhaler 1  . allopurinol (ZYLOPRIM) 100 MG tablet Take 100 mg by mouth once daily on non-dialysis days (Tues/Thurs/Sat/Sun) (Patient taking differently: Take 100 mg by mouth See admin instructions. Take one tablet (100 mg) by mouth once daily on non-dialysis days (Tues/Thurs/Sat/Sun)) 30 tablet 0  . amLODipine (NORVASC) 10 MG tablet Take 1 tablet (10 mg total) by mouth at bedtime. 90 tablet 1  . aspirin EC 81 MG tablet Take 81 mg by mouth daily.     Marland Kitchen atorvastatin (LIPITOR) 40 MG tablet Take 1 tablet (40 mg total) by mouth daily. (Patient taking differently: Take 40 mg by mouth at bedtime. ) 90 tablet 3  . azelastine (ASTELIN) 0.1 % nasal spray Place 1 spray into both nostrils 2 (two) times daily. Use in each nostril as directed (Patient taking differently: Place 1 spray into both nostrils 2 (two) times daily as needed for rhinitis or allergies. Use in each nostril as directed) 30 mL 6  . cinacalcet (SENSIPAR) 90 MG tablet Take 90 mg by mouth at bedtime.    . cloNIDine (CATAPRES) 0.2 MG tablet Take 1 tablet (0.2 mg total) by mouth 2 (two) times daily. (Patient taking differently: Take 0.3 mg  by mouth 3 (three) times daily. )    . DULoxetine (CYMBALTA) 30 MG capsule Take 2 capsules (60 mg total) by mouth daily. 180 capsule 3  . fluticasone (FLONASE) 50 MCG/ACT nasal spray Place 2 sprays into both nostrils daily as needed for allergies or rhinitis. 16 g 3  . Homeopathic Products (LEG CRAMP RELIEF PO) Take 1-2 tablets by mouth daily as needed (for leg pain).     . hydrALAZINE (APRESOLINE) 10 MG tablet Take 1 tablet (10 mg total) by mouth 2 (two) times daily. 180 tablet 1  . ipratropium (ATROVENT HFA) 17 MCG/ACT inhaler Inhale 2 puffs into the lungs every 4 (four) hours as needed for wheezing. 1 Inhaler 12  . lidocaine-prilocaine (EMLA) cream Apply 1 application topically See admin instructions. Apply topically one hour before dialysis - Monday, Wednesday, Friday    . loratadine (CLARITIN) 10 MG tablet Take 1 tablet (10 mg total) by mouth daily as needed for allergies. 90 tablet 3  . losartan (COZAAR) 100 MG tablet Take 1 tablet (100 mg  total) by mouth daily. 90 tablet 1  . metoprolol succinate (TOPROL-XL) 100 MG 24 hr tablet TAKE 1 TABLET BY MOUTH TWICE DAILY 180 tablet 1  . multivitamin (RENA-VIT) TABS tablet Take 1 tablet by mouth at bedtime. (Patient taking differently: Take 1 tablet by mouth daily. ) 60 tablet 5  . omeprazole (PRILOSEC) 40 MG capsule Take 1 capsule (40 mg total) by mouth 2 (two) times daily. 60 capsule 11  . pregabalin (LYRICA) 100 MG capsule Take 100 mg by mouth at bedtime.     . sevelamer carbonate (RENVELA) 800 MG tablet Take 2,400 mg by mouth See admin instructions. Take 3 tablets (2400 mg) by mouth with each meal - 4 times daily     No facility-administered medications prior to visit.     PAST MEDICAL HISTORY: Past Medical History:  Diagnosis Date  . Abnormality of gait 03/28/2015  . Adrenal mass ([place])   . 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 Arthritis   . Back pain   . CHF (congestive heart failure) ([place])   .  Chronic kidney disease    Hemo MWF  . Congestion of throat    Pt states she has a lot mucus in back throat.  . Constipation   . COPD (chronic obstructive pulmonary disease) ([place])   . Depression   . Dialysis patient [facility])    kidney  . Diverticulitis   . GERD (gastroesophageal reflux disease)   . H/O hiatal hernia   . High cholesterol   . Hoarseness of voice   . Hyperlipidemia   . Hypertension   . IBS (irritable bowel syndrome)   . Meralgia paresthetica of right side 12/26/2014  . Normal cardiac stress test 12/24/2009   lexiscan, imaging normal  . Renal disorder   . RLS (restless legs syndrome)   . Seizures ([place])    2004 past brain surgery  . Sinus complaint   . Thyroid disease   . TIA (transient ischemic attack)   . Tubular adenoma of colon 01/2008    PAST SURGICAL HISTORY: Past Surgical History:  Procedure Laterality Date  . ABDOMINAL HYSTERECTOMY    . ANAL RECTAL MANOMETRY N/A 07/25/2015   Procedure: ANO RECTAL MANOMETRY;  Surgeon: Mauri Pole, MD;  Location: [facility];  Service: Endoscopy;  Laterality: N/A;  . APPENDECTOMY    . AV FISTULA PLACEMENT Left 11/11/2012   Procedure: INSERTION OF ARTERIOVENOUS (AV) GORE-TEX GRAFT ARM;  Surgeon: Angelia Mould, MD;  Location: [place];  Service: Vascular;  Laterality: Left;  . [place] REMOVAL Left 11/18/2012   Procedure: REMOVAL OF LEFT UPPER ARM ARTERIOVENOUS GORETEX GRAFT ([place]);  Surgeon: Angelia Mould, MD;  Location: [place];  Service: Vascular;  Laterality: Left;  . CARDIAC CATHETERIZATION  2003   normal  . CHOLECYSTECTOMY     Open mid line incision  . frontal craniotomy  2002   indication = sinusitis  . INSERTION OF DIALYSIS CATHETER     Left  . PATCH ANGIOPLASTY Left 11/18/2012   Procedure: PATCH ANGIOPLASTY;  Surgeon: Angelia Mould, MD;  Location: [facility];  Service: Vascular;  Laterality: Left;  . PERIPHERAL VASCULAR CATHETERIZATION Left 03/08/2015   Procedure: A/V Shuntogram/Fistulagram;  Surgeon:  Algernon Huxley, MD;  Location: [facility] CV LAB;  Service: Cardiovascular;  Laterality: Left;  . PERIPHERAL VASCULAR CATHETERIZATION Left 03/08/2015   Procedure: A/V Shunt Intervention;  Surgeon: Algernon Huxley, MD;  Location: [facility] INVASIVE CV  LAB;  Service: Cardiovascular;  Laterality: Left;  . RECTAL ULTRASOUND N/A 10/05/2015   Procedure: ANAL ULTRASOUND WITH PROBE;  Surgeon: Leighton Ruff, MD;  Location: [facility];  Service: Endoscopy;  Laterality: N/A;  . REVISON OF ARTERIOVENOUS FISTULA  05/11/2012   Procedure: REVISON OF ARTERIOVENOUS FISTULA;  Surgeon: Elam Dutch, MD;  Location: [facility];  Service: Vascular;  Laterality: Right;  . TUBAL LIGATION      FAMILY HISTORY: Family History  Problem Relation Age of Onset  . Thyroid cancer Mother   . Heart disease Father   . Hypertension Father   . Heart attack Father   . Lupus Daughter   . Breast cancer Sister   . Thyroid cancer Sister   . Kidney disease Sister   . Colon cancer Neg Hx     SOCIAL HISTORY: Social History   Socioeconomic History  . Marital status: Single    Spouse name: Not on file  . Number of children: 3  . Years of education: 69  . Highest education level: Not on file  Social Needs  . Financial resource strain: Not on file  . Food insecurity - worry: Not on file  . Food insecurity - inability: Not on file  . Transportation needs - medical: Not on file  . Transportation needs - non-medical: Not on file  Occupational History  . Occupation: retired  Tobacco Use  . Smoking status: Current Every Day Smoker    Packs/day: 1.00    Years: 60.00    Pack years: 60.00    Types: Cigarettes  . Smokeless tobacco: Never Used  . Tobacco comment: smoker since 76 yo.  1.5 ppd of [place] 100 lights. ; form given 10-23-16  Substance and Sexual Activity  . Alcohol use: No    Alcohol/week: 0.0 oz  . Drug use: No    Comment: No hx of IV drug use  . Sexual activity: No    Birth control/protection: None  Other Topics Concern   . Not on file  Social History Narrative   ** Merged History Encounter **       Lives in [place] with Daughter, granddaughters (twins) and mentally challenged niece.   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   Patient has a college education.   Patient drinks one cup of caffeine daily.               PHYSICAL EXAM  Vitals:   05/19/17 1124  BP: (!) 165/56  Pulse: 70  Weight: 168 lb 3.2 oz (76.3 kg)  Height: 5\' 1"  (1.549 m)   Body mass index is 31.78 kg/m.  Generalized: Well developed, in no acute distress   Neurological examination  Mentation: Alert oriented to time, place, history taking. Follows all commands speech and language fluent Cranial nerve II-XII: Pupils were equal round reactive to light. Extraocular movements were full, visual field were full on confrontational test. Facial sensation and strength were normal. Uvula tongue midline. Head turning and shoulder shrug  were normal and symmetric. Motor: The motor testing reveals 5 over 5 strength of all 4 extremities. Good symmetric motor tone is noted throughout.  Sensory: Sensory testing is intact to soft touch on all 4 extremities. No evidence of extinction is noted.  Coordination: Cerebellar testing reveals good finger-nose-finger and heel-to-shin bilaterally.  Gait and station: Patient uses a cane when ambulating.  Tandem gait not attempted. Reflexes: Deep tendon reflexes are symmetric and normal bilaterally.   DIAGNOSTIC DATA (LABS, IMAGING, TESTING) - I  reviewed patient records, labs, notes, testing and imaging myself where available.  Lab Results  Component Value Date   WBC 8.5 12/23/2016   HGB 10.8 (L) 12/23/2016   HCT 33.3 (L) 12/23/2016   MCV 88.1 12/23/2016   PLT 133 (L) 12/23/2016      Component Value Date/Time   NA 139 01/08/2017 1324   NA 136 07/07/2013 1334   K 4.2 01/08/2017 1324   K 4.0 07/07/2013 1334   CL 94 (L) 01/08/2017 1324   CL 100 07/07/2013 1334   CO2 27  01/08/2017 1324   CO2 29 07/07/2013 1334   GLUCOSE 94 01/08/2017 1324   GLUCOSE 80 12/23/2016 0451   GLUCOSE 75 07/07/2013 1334   BUN 13 01/08/2017 1324   BUN 30 (H) 07/07/2013 1334   CREATININE 5.97 (H) 01/08/2017 1324   CREATININE 7.07 (H) 07/07/2013 1334   CREATININE 4.92 (H) 03/11/2012 1218   CALCIUM 9.8 01/08/2017 1324   CALCIUM 10.0 07/07/2013 1334   CALCIUM 8.4 05/06/2010 0515   PROT 6.8 01/08/2017 1324   ALBUMIN 3.7 01/08/2017 1324   AST 16 01/08/2017 1324   ALT 8 01/08/2017 1324   ALKPHOS 96 01/08/2017 1324   BILITOT 0.2 01/08/2017 1324   GFRNONAA 6 (L) 01/08/2017 1324   GFRNONAA 5 (L) 07/07/2013 1334   GFRAA 7 (L) 01/08/2017 1324   GFRAA 6 (L) 07/07/2013 1334   Lab Results  Component Value Date   CHOL 154 05/01/2010   HDL 36 (L) 05/01/2010   LDLCALC 96 05/01/2010   LDLDIRECT 76 08/13/2011   TRIG 109 05/01/2010   CHOLHDL 4.3 Ratio 05/01/2010   Lab Results  Component Value Date   HGBA1C 5.1 12/07/2016   Lab Results  Component Value Date   VITAMINB12 1,524 (H) 01/08/2017   Lab Results  Component Value Date   TSH 1.464 12/07/2016      ASSESSMENT AND PLAN 76 y.o. year old female  has a past medical history of Abnormality of gait (03/28/2015), Adrenal mass ([place]), ANEMIA NEC (03/31/2007), Arthritis, Back pain, CHF (congestive heart failure) ([facility]), Chronic kidney disease, Congestion of throat, Constipation, COPD (chronic obstructive pulmonary disease) ([place]), Depression, Dialysis patient ([place]), Diverticulitis, GERD (gastroesophageal reflux disease), H/O hiatal hernia, High cholesterol, Hoarseness of voice, Hyperlipidemia, Hypertension, IBS (irritable bowel syndrome), Meralgia paresthetica of right side (12/26/2014), Normal cardiac stress test (12/24/2009), Renal disorder, RLS (restless legs syndrome), Seizures ([place]), Sinus complaint, Thyroid disease, TIA (transient ischemic attack), and Tubular adenoma of colon (01/2008). here with:  1.  Word finding issues 2.   Generalized weakness 3.  Peripheral neuropathy  The patient's exam was relatively unremarkable.  Due to her description of generalized weakness as well as word finding issues I recommended an MRI of the brain without contrast.  Patient is amenable to this plan.  It appears that her primary care has referred her for home health services.  Advised patient that if her symptoms worsen or she develops new symptoms she should let us know.  She will follow-up in 6 months or sooner if needed.     Ward Givens, MSN, NP-C 05/19/2017, 11:45 AM [facility] [address] [place], [place] [address] [phone]

## 2017-05-20 ENCOUNTER — Ambulatory Visit: Payer: Medicare Other | Admitting: Family Medicine

## 2017-05-20 DIAGNOSIS — N2581 Secondary hyperparathyroidism of renal origin: Secondary | ICD-10-CM | POA: Diagnosis not present

## 2017-05-20 DIAGNOSIS — D631 Anemia in chronic kidney disease: Secondary | ICD-10-CM | POA: Diagnosis not present

## 2017-05-20 DIAGNOSIS — N186 End stage renal disease: Secondary | ICD-10-CM | POA: Diagnosis not present

## 2017-05-22 DIAGNOSIS — D631 Anemia in chronic kidney disease: Secondary | ICD-10-CM | POA: Diagnosis not present

## 2017-05-22 DIAGNOSIS — N2581 Secondary hyperparathyroidism of renal origin: Secondary | ICD-10-CM | POA: Diagnosis not present

## 2017-05-22 DIAGNOSIS — N186 End stage renal disease: Secondary | ICD-10-CM | POA: Diagnosis not present

## 2017-05-24 DIAGNOSIS — N2581 Secondary hyperparathyroidism of renal origin: Secondary | ICD-10-CM | POA: Diagnosis not present

## 2017-05-24 DIAGNOSIS — D631 Anemia in chronic kidney disease: Secondary | ICD-10-CM | POA: Diagnosis not present

## 2017-05-24 DIAGNOSIS — N186 End stage renal disease: Secondary | ICD-10-CM | POA: Diagnosis not present

## 2017-05-28 DIAGNOSIS — N2581 Secondary hyperparathyroidism of renal origin: Secondary | ICD-10-CM | POA: Diagnosis not present

## 2017-05-28 DIAGNOSIS — Z992 Dependence on renal dialysis: Secondary | ICD-10-CM | POA: Diagnosis not present

## 2017-05-28 DIAGNOSIS — T82858A Stenosis of vascular prosthetic devices, implants and grafts, initial encounter: Secondary | ICD-10-CM | POA: Diagnosis not present

## 2017-05-28 DIAGNOSIS — D631 Anemia in chronic kidney disease: Secondary | ICD-10-CM | POA: Diagnosis not present

## 2017-05-28 DIAGNOSIS — N186 End stage renal disease: Secondary | ICD-10-CM | POA: Diagnosis not present

## 2017-05-30 ENCOUNTER — Ambulatory Visit
Admission: RE | Admit: 2017-05-30 | Discharge: 2017-05-30 | Disposition: A | Discharge status: Home / Self Care | Payer: Medicare Other | Source: Ambulatory Visit | Attending: Adult Health | Admitting: Adult Health

## 2017-05-30 DIAGNOSIS — R413 Other amnesia: Secondary | ICD-10-CM | POA: Diagnosis not present

## 2017-05-30 DIAGNOSIS — M6281 Muscle weakness (generalized): Secondary | ICD-10-CM

## 2017-05-31 DIAGNOSIS — D631 Anemia in chronic kidney disease: Secondary | ICD-10-CM | POA: Diagnosis not present

## 2017-05-31 DIAGNOSIS — N2581 Secondary hyperparathyroidism of renal origin: Secondary | ICD-10-CM | POA: Diagnosis not present

## 2017-05-31 DIAGNOSIS — N186 End stage renal disease: Secondary | ICD-10-CM | POA: Diagnosis not present

## 2017-06-01 ENCOUNTER — Other Ambulatory Visit: Payer: Self-pay | Admitting: Family Medicine

## 2017-06-01 DIAGNOSIS — E1129 Type 2 diabetes mellitus with other diabetic kidney complication: Secondary | ICD-10-CM | POA: Diagnosis not present

## 2017-06-01 DIAGNOSIS — Z992 Dependence on renal dialysis: Secondary | ICD-10-CM | POA: Diagnosis not present

## 2017-06-01 DIAGNOSIS — N186 End stage renal disease: Secondary | ICD-10-CM | POA: Diagnosis not present

## 2017-06-03 ENCOUNTER — Telehealth: Payer: Self-pay | Admitting: Adult Health

## 2017-06-03 DIAGNOSIS — Z23 Encounter for immunization: Secondary | ICD-10-CM | POA: Diagnosis not present

## 2017-06-03 DIAGNOSIS — Z283 Underimmunization status: Secondary | ICD-10-CM | POA: Diagnosis not present

## 2017-06-03 DIAGNOSIS — D631 Anemia in chronic kidney disease: Secondary | ICD-10-CM | POA: Diagnosis not present

## 2017-06-03 DIAGNOSIS — N186 End stage renal disease: Secondary | ICD-10-CM | POA: Diagnosis not present

## 2017-06-03 DIAGNOSIS — N2581 Secondary hyperparathyroidism of renal origin: Secondary | ICD-10-CM | POA: Diagnosis not present

## 2017-06-03 NOTE — Telephone Encounter (Signed)
Spoke with patient and informed her that her MRI brain did not show any acute findings. Advised it is a stable exam with changes from her craniotomy. Patient asked "what was next". This RN advised that she continue with [facility] referral for RN, PT, OT which should improve her weakness. She stated the RN is seeing her tomorrow. Reminded her of her follow up in 6 months, advised she call sooner for any concerns, questions. Patient verbalized understanding, appreciation.

## 2017-06-03 NOTE — Telephone Encounter (Signed)
Pt is calling for MRI results, scan was done on 12/29. Pt was advised due to holidays the office was closed Monday and Tuesday and it could take 4-5 business days for the results. Pt said she understood

## 2017-06-05 DIAGNOSIS — Z283 Underimmunization status: Secondary | ICD-10-CM | POA: Diagnosis not present

## 2017-06-05 DIAGNOSIS — D631 Anemia in chronic kidney disease: Secondary | ICD-10-CM | POA: Diagnosis not present

## 2017-06-05 DIAGNOSIS — N186 End stage renal disease: Secondary | ICD-10-CM | POA: Diagnosis not present

## 2017-06-05 DIAGNOSIS — N2581 Secondary hyperparathyroidism of renal origin: Secondary | ICD-10-CM | POA: Diagnosis not present

## 2017-06-05 DIAGNOSIS — Z23 Encounter for immunization: Secondary | ICD-10-CM | POA: Diagnosis not present

## 2017-06-08 DIAGNOSIS — Z23 Encounter for immunization: Secondary | ICD-10-CM | POA: Diagnosis not present

## 2017-06-08 DIAGNOSIS — N2581 Secondary hyperparathyroidism of renal origin: Secondary | ICD-10-CM | POA: Diagnosis not present

## 2017-06-08 DIAGNOSIS — N186 End stage renal disease: Secondary | ICD-10-CM | POA: Diagnosis not present

## 2017-06-08 DIAGNOSIS — Z283 Underimmunization status: Secondary | ICD-10-CM | POA: Diagnosis not present

## 2017-06-08 DIAGNOSIS — D631 Anemia in chronic kidney disease: Secondary | ICD-10-CM | POA: Diagnosis not present

## 2017-06-10 DIAGNOSIS — D631 Anemia in chronic kidney disease: Secondary | ICD-10-CM | POA: Diagnosis not present

## 2017-06-10 DIAGNOSIS — Z283 Underimmunization status: Secondary | ICD-10-CM | POA: Diagnosis not present

## 2017-06-10 DIAGNOSIS — Z23 Encounter for immunization: Secondary | ICD-10-CM | POA: Diagnosis not present

## 2017-06-10 DIAGNOSIS — N2581 Secondary hyperparathyroidism of renal origin: Secondary | ICD-10-CM | POA: Diagnosis not present

## 2017-06-10 DIAGNOSIS — N186 End stage renal disease: Secondary | ICD-10-CM | POA: Diagnosis not present

## 2017-06-12 DIAGNOSIS — D631 Anemia in chronic kidney disease: Secondary | ICD-10-CM | POA: Diagnosis not present

## 2017-06-12 DIAGNOSIS — Z283 Underimmunization status: Secondary | ICD-10-CM | POA: Diagnosis not present

## 2017-06-12 DIAGNOSIS — N2581 Secondary hyperparathyroidism of renal origin: Secondary | ICD-10-CM | POA: Diagnosis not present

## 2017-06-12 DIAGNOSIS — N186 End stage renal disease: Secondary | ICD-10-CM | POA: Diagnosis not present

## 2017-06-12 DIAGNOSIS — Z23 Encounter for immunization: Secondary | ICD-10-CM | POA: Diagnosis not present

## 2017-06-15 DIAGNOSIS — Z283 Underimmunization status: Secondary | ICD-10-CM | POA: Diagnosis not present

## 2017-06-15 DIAGNOSIS — D631 Anemia in chronic kidney disease: Secondary | ICD-10-CM | POA: Diagnosis not present

## 2017-06-15 DIAGNOSIS — Z23 Encounter for immunization: Secondary | ICD-10-CM | POA: Diagnosis not present

## 2017-06-15 DIAGNOSIS — N2581 Secondary hyperparathyroidism of renal origin: Secondary | ICD-10-CM | POA: Diagnosis not present

## 2017-06-15 DIAGNOSIS — N186 End stage renal disease: Secondary | ICD-10-CM | POA: Diagnosis not present

## 2017-06-16 ENCOUNTER — Ambulatory Visit (INDEPENDENT_AMBULATORY_CARE_PROVIDER_SITE_OTHER): Payer: Medicare Other | Admitting: Vascular Surgery

## 2017-06-16 ENCOUNTER — Encounter (INDEPENDENT_AMBULATORY_CARE_PROVIDER_SITE_OTHER): Payer: Self-pay | Admitting: Vascular Surgery

## 2017-06-16 VITALS — BP 117/46 | HR 73 | Resp 16 | Wt 167.8 lb

## 2017-06-16 DIAGNOSIS — N186 End stage renal disease: Secondary | ICD-10-CM | POA: Diagnosis not present

## 2017-06-16 DIAGNOSIS — I10 Essential (primary) hypertension: Secondary | ICD-10-CM | POA: Diagnosis not present

## 2017-06-16 DIAGNOSIS — Z992 Dependence on renal dialysis: Secondary | ICD-10-CM | POA: Diagnosis not present

## 2017-06-16 NOTE — Assessment & Plan Note (Signed)
The patient has been having worsening issues with this requiring multiple interventions.  She does not have any skin threat.  She has no prolonged bleeding.  She does not have a strong and hard indication for surgical revision, but certainly as the access function decreases that is always a reasonable option to consider.  We discussed options today including jump graft revision of the access or consideration for a new access.  This would require catheter for some period of time.  She does not desire to have revision at this point, and quite honestly I think that is very reasonable and would not push her towards any revision at this point.  Should her access begin to have increasing problems, that is certainly something consider suture.  I will see her back as needed.

## 2017-06-16 NOTE — Progress Notes (Signed)
MRN : [id number]  [name] is a 77 y.o. (February 14, 1941) female who presents with chief complaint of  Chief Complaint  Patient presents with  . Follow-up    hero has reopened with left arm  .  History of Present Illness: Patient returns today in follow up of her dialysis access.  She has had a left arm hero graft for several years now.  Over the past year or so, she has required interventions to this graft about every 3 months.  It is also become more painful to her.  She has never had a stent placed to her knowledge.  The access still seems to be working fine until about 3 months after her intervention.  The nephrologist in [place] who performs her interventions has recommended she see me to discuss a revision.  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  Current Outpatient Medications  Medication Sig Dispense Refill  . acetaminophen (TYLENOL) 500 MG tablet Take 1,000 mg by mouth every 6 (six) hours as needed for mild pain or moderate pain.    Marland Kitchen albuterol (PROVENTIL HFA;VENTOLIN HFA) 108 (90 Base) MCG/ACT inhaler Inhale 2 puffs into the lungs every 4 (four) hours as needed for wheezing or shortness of breath. 1 Inhaler 1  . allopurinol (ZYLOPRIM) 100 MG tablet Take 100 mg by mouth once daily on non-dialysis days (Tues/Thurs/Sat/Sun) (Patient taking differently: Take 100 mg by mouth See admin instructions. Take one tablet (100 mg) by mouth once daily on non-dialysis days (Tues/Thurs/Sat/Sun)) 30 tablet 0  . amLODipine (NORVASC) 10 MG tablet TAKE 1 TABLET BY MOUTH AT BEDTIME 90 tablet 1  . aspirin EC 81 MG tablet Take 81 mg by mouth daily.     Marland Kitchen atorvastatin (LIPITOR) 40 MG tablet Take 1 tablet (40 mg total) by mouth daily. (Patient taking differently: Take 40 mg by mouth at bedtime. ) 90 tablet 3  . azelastine (ASTELIN) 0.1 % nasal spray Place 1 spray into both nostrils 2 (two) times daily. Use in each nostril as directed (Patient taking differently: Place 1 spray into both  nostrils 2 (two) times daily as needed for rhinitis or allergies. Use in each nostril as directed) 30 mL 6  . cinacalcet (SENSIPAR) 90 MG tablet Take 90 mg by mouth at bedtime.    . cloNIDine (CATAPRES) 0.2 MG tablet Take 1 tablet (0.2 mg total) by mouth 2 (two) times daily. (Patient taking differently: Take 0.3 mg by mouth 3 (three) times daily. )    . DULoxetine (CYMBALTA) 30 MG capsule Take 2 capsules (60 mg total) by mouth daily. 180 capsule 3  . fluticasone (FLONASE) 50 MCG/ACT nasal spray Place 2 sprays into both nostrils daily as needed for allergies or rhinitis. 16 g 3  . Homeopathic Products (LEG CRAMP RELIEF PO) Take 1-2 tablets by mouth daily as needed (for leg pain).     . hydrALAZINE (APRESOLINE) 10 MG tablet Take 1 tablet (10 mg total) by mouth 2 (two) times daily. 180 tablet 1  . ipratropium (ATROVENT HFA) 17 MCG/ACT inhaler Inhale 2 puffs into the lungs every 4 (four) hours as needed for wheezing. 1 Inhaler 12  . lidocaine-prilocaine (EMLA) cream Apply 1 application topically See admin instructions. Apply topically one hour before dialysis - Monday, Wednesday, Friday    . loratadine (CLARITIN) 10 MG tablet Take 1 tablet (10 mg total) by mouth daily as needed for allergies. 90 tablet 3  . losartan (COZAAR) 100 MG tablet Take 1 tablet (  100 mg total) by mouth daily. 90 tablet 1  . metoprolol succinate (TOPROL-XL) 100 MG 24 hr tablet TAKE 1 TABLET BY MOUTH TWICE DAILY 180 tablet 1  . multivitamin (RENA-VIT) TABS tablet Take 1 tablet by mouth at bedtime. (Patient taking differently: Take 1 tablet by mouth daily. ) 60 tablet 5  . omeprazole (PRILOSEC) 40 MG capsule Take 1 capsule (40 mg total) by mouth 2 (two) times daily. 60 capsule 11  . pregabalin (LYRICA) 100 MG capsule Take 100 mg by mouth at bedtime.     . sevelamer carbonate (RENVELA) 800 MG tablet Take 2,400 mg by mouth See admin instructions. Take 3 tablets (2400 mg) by mouth with each meal - 4 times daily     No current  facility-administered medications for this visit.     Past Medical History:  Diagnosis Date  . Abnormality of gait 03/28/2015  . Adrenal mass ([place])   . 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 Arthritis   . Back pain   . CHF (congestive heart failure) ([place])   . Chronic kidney disease    Hemo MWF  . Congestion of throat    Pt states she has a lot mucus in back throat.  . Constipation   . COPD (chronic obstructive pulmonary disease) ([place])   . Depression   . Dialysis patient [facility])    kidney  . Diverticulitis   . GERD (gastroesophageal reflux disease)   . H/O hiatal hernia   . High cholesterol   . Hoarseness of voice   . Hyperlipidemia   . Hypertension   . IBS (irritable bowel syndrome)   . Meralgia paresthetica of right side 12/26/2014  . Normal cardiac stress test 12/24/2009   lexiscan, imaging normal  . Renal disorder   . RLS (restless legs syndrome)   . Seizures ([place])    2004 past brain surgery  . Sinus complaint   . Thyroid disease   . TIA (transient ischemic attack)   . Tubular adenoma of colon 01/2008    Past Surgical History:  Procedure Laterality Date  . ABDOMINAL HYSTERECTOMY    . ANAL RECTAL MANOMETRY N/A 07/25/2015   Procedure: ANO RECTAL MANOMETRY;  Surgeon: Mauri Pole, MD;  Location: [facility];  Service: Endoscopy;  Laterality: N/A;  . APPENDECTOMY    . AV FISTULA PLACEMENT Left 11/11/2012   Procedure: INSERTION OF ARTERIOVENOUS (AV) GORE-TEX GRAFT ARM;  Surgeon: Angelia Mould, MD;  Location: [place];  Service: Vascular;  Laterality: Left;  . [place] REMOVAL Left 11/18/2012   Procedure: REMOVAL OF LEFT UPPER ARM ARTERIOVENOUS GORETEX GRAFT ([place]);  Surgeon: Angelia Mould, MD;  Location: [place];  Service: Vascular;  Laterality: Left;  . CARDIAC CATHETERIZATION  2003   normal  . CHOLECYSTECTOMY     Open mid line incision  . frontal craniotomy  2002   indication = sinusitis  . INSERTION OF DIALYSIS  CATHETER     Left  . PATCH ANGIOPLASTY Left 11/18/2012   Procedure: PATCH ANGIOPLASTY;  Surgeon: Angelia Mould, MD;  Location: [place];  Service: Vascular;  Laterality: Left;  . PERIPHERAL VASCULAR CATHETERIZATION Left 03/08/2015   Procedure: A/V Shuntogram/Fistulagram;  Surgeon: Algernon Huxley, MD;  Location: [place] CV LAB;  Service: Cardiovascular;  Laterality: Left;  . PERIPHERAL VASCULAR CATHETERIZATION Left 03/08/2015   Procedure: A/V Shunt Intervention;  Surgeon: Algernon Huxley, MD;  Location: [place] CV LAB;  Service:  Cardiovascular;  Laterality: Left;  . RECTAL ULTRASOUND N/A 10/05/2015   Procedure: ANAL ULTRASOUND WITH PROBE;  Surgeon: Leighton Ruff, MD;  Location: [facility];  Service: Endoscopy;  Laterality: N/A;  . REVISON OF ARTERIOVENOUS FISTULA  05/11/2012   Procedure: REVISON OF ARTERIOVENOUS FISTULA;  Surgeon: Elam Dutch, MD;  Location: [place];  Service: Vascular;  Laterality: Right;  . TUBAL LIGATION      Social History Social History   Tobacco Use  . Smoking status: Current Every Day Smoker    Packs/day: 1.00    Years: 60.00    Pack years: 60.00    Types: Cigarettes  . Smokeless tobacco: Never Used  . Tobacco comment: smoker since 77 yo.  1.5 ppd of [place] 100 lights. ; form given 10-23-16  Substance Use Topics  . Alcohol use: No    Alcohol/week: 0.0 oz  . Drug use: No    Comment: No hx of IV drug use      Family History Family History  Problem Relation Age of Onset  . Thyroid cancer Mother   . Heart disease Father   . Hypertension Father   . Heart attack Father   . Lupus Daughter   . Breast cancer Sister   . Thyroid cancer Sister   . Kidney disease Sister   . Colon cancer Neg Hx      Allergies  Allergen Reactions  . Tuberculin Tests Hives    "blisters"  . Valacyclovir Other (See Comments)    Confusion and nervousness  . Codeine Nausea And Vomiting  . Penicillins Rash    No problems breathing. Has tolerated omnicef in past  without issue Has patient had a PCN reaction causing immediate rash, facial/tongue/throat swelling, SOB or lightheadedness with hypotension: Yes Has patient had a PCN reaction causing severe rash involving mucus membranes or skin necrosis: No Has patient had a PCN reaction that required hospitalization No Has patient had a PCN reaction occurring within the last 10 years: No If all of the above answers are "NO", then may proceed with Cephalosporin  . Sulfamethoxazole Rash     REVIEW OF SYSTEMS (Negative unless checked)  Constitutional: [] Weight loss  [] Fever  [] Chills Cardiac: [] Chest pain   [] Chest pressure   [] Palpitations   [] Shortness of breath when laying flat   [] Shortness of breath at rest   [] Shortness of breath with exertion. Vascular:  [] Pain in legs with walking   [] Pain in legs at rest   [] Pain in legs when laying flat   [] Claudication   [] Pain in feet when walking  [] Pain in feet at rest  [] Pain in feet when laying flat   [] History of DVT   [] Phlebitis   [] Swelling in legs   [] Varicose veins   [] Non-healing ulcers Pulmonary:   [] Uses home oxygen   [] Productive cough   [] Hemoptysis   [] Wheeze  [] COPD   [] Asthma Neurologic:  [] Dizziness  [] Blackouts   [] Seizures   [] History of stroke   [] History of TIA  [] Aphasia   [] Temporary blindness   [] Dysphagia   [] Weakness or numbness in arms   [x] Weakness or numbness in legs Musculoskeletal:  [x] Arthritis   [] Joint swelling   [x] Joint pain   [] Low back pain Hematologic:  [x] Easy bruising  [] Easy bleeding   [] Hypercoagulable state   [x] Anemic   Gastrointestinal:  [] Blood in stool   [] Vomiting blood  [] Gastroesophageal reflux/heartburn   [] Abdominal pain Genitourinary:  [x] Chronic kidney disease   [] Difficult urination  [] Frequent urination  [] Burning with urination   []   Hematuria Skin:  [] Rashes   [] Ulcers   [] Wounds Psychological:  [] History of anxiety   []  History of major depression.  Physical Examination  BP (!) 117/46 (BP Location:  Right Arm)   Pulse 73   Resp 16   Wt 76.1 kg (167 lb 12.8 oz)   BMI 31.71 kg/m  Gen:  WD/WN, NAD Head: [place]/AT, No temporalis wasting. Ear/Nose/Throat: Hearing grossly intact, nares w/o erythema or drainage, trachea midline Eyes: Conjunctiva clear. Sclera non-icteric Neck: Supple.  No JVD.  Pulmonary:  Good air movement, no use of accessory muscles.  Cardiac: RRR, normal S1, S2 Vascular: good thrill in left arm HeRO Vessel Right Left  Radial Palpable Palpable                                   Musculoskeletal: M/S 5/5 throughout.  No deformity or atrophy.  Neurologic: Sensation grossly intact in extremities.  Symmetrical.  Speech is fluent.  Psychiatric: Judgment intact, Mood & affect appropriate for pt's clinical situation. Dermatologic: No rashes or ulcers noted.  No cellulitis or open wounds.       Labs No results found for this or any previous visit (from the past 2160 hour(s)).  Radiology Mr Brain Wo Contrast  Result Date: 05/31/2017 [facility] NEUROIMAG[facility] REPORT STUDY DATE: 05/30/17 PATIENT NAME: TERRIYAH WESTRA DOB: Jul 13, 1940 MRN: [id number] ORDERING CLINICIAN: Ward Givens, NP / Kathrynn Ducking, MD CLINICAL HISTORY: 77 year old female with memory loss. EXAM: MRI brain (without) TECHNIQUE: MRI of the brain without contrast was obtained utilizing 5 mm axial slices with T1, T2, T2 flair, SWI and diffusion weighted views.  T1 sagittal and T2 coronal views were obtained. CONTRAST: no COMPARISON: 11/14/13 MRI brain IMAGING SITE: [facility] [address] (1.5 Tesla MRI)  FINDINGS: Bifrontal craniotomy changes with left frontal encephalomalacia and gliosis. No abnormal lesions are seen on diffusion-weighted views to suggest acute ischemia. The remaining cortical sulci, fissures and cisterns are normal in size and appearance. Lateral, third and fourth ventricle are normal in size and appearance. No extra-axial fluid collections are seen. No  evidence of mass effect or midline shift.  On sagittal views the posterior fossa, pituitary gland and corpus callosum are notable for enlarged partially empty sella. No evidence of intracranial hemorrhage on SWI views. The orbits and their contents, paranasal sinuses and calvarium are unremarkable.  Intracranial flow voids are present.   Stable bifrontal craniotomy changes with left frontal encephalomalacia and gliosis. No acute findings. INTERPRETING PHYSICIAN: Penni Bombard, MD Certified in Neurology, Neurophysiology and Neuroimaging [facility] [address], [place], [place] [address] [phone]      Assessment/Plan  Hypertension blood pressure control important in reducing the progression of atherosclerotic disease. On appropriate oral medications.   End stage renal disease on dialysis [facility]) The patient has been having worsening issues with this requiring multiple interventions.  She does not have any skin threat.  She has no prolonged bleeding.  She does not have a strong and hard indication for surgical revision, but certainly as the access function decreases that is always a reasonable option to consider.  We discussed options today including jump graft revision of the access or consideration for a new access.  This would require catheter for some period of time.  She does not desire to have revision at this point, and quite honestly I think that is very reasonable and  would not push her towards any revision at this point.  Should her access begin to have increasing problems, that is certainly something consider suture.  I will see her back as needed.    Leotis Pain, MD  06/16/2017 12:23 PM    This note was created with Dragon medical transcription system.  Any errors from dictation are purely unintentional

## 2017-06-16 NOTE — Assessment & Plan Note (Signed)
blood pressure control important in reducing the progression of atherosclerotic disease. On appropriate oral medications.  

## 2017-06-16 NOTE — Patient Instructions (Signed)
Vascular Access for Hemodialysis A vascular access is a connection between two blood vessels that allows blood to be easily removed from the body and returned to the body during hemodialysis. Hemodialysis is a procedure in which a machine outside of the body filters the blood. There are three types of vascular accesses:  Arteriovenous fistula. This is a connection between an artery and a vein (usually in the arm) that is made by sewing them together. Blood in the artery flows directly into the vein, causing it to get larger over time. This makes it easier for the vein to be used for hemodialysis. An arteriovenous fistula takes 1-6 months to develop after surgery.  Arteriovenous graft. This is a connection between an artery and a vein in the arm that is made with a tube. An arteriovenous graft can be used within 2-3 weeks of surgery.  Venous catheter. This is a thin, flexible tube that is placed in a large vein (usually in the neck, chest, or groin). A venous catheter for hemodialysis contains two tubes that come out of the skin. A venous catheter can be used right away. It is usually used as a temporary access if you need hemodialysis before a fistula or graft has developed. It may also be used as a permanent access if a fistula or graft cannot be created.  Which type of access is best for me? The type of access that is best for you depends on the size and strength of your veins. A fistula is usually the preferred type of access. It can last several years and is less likely than the other types of accesses to become infected or to cause blood clots within a blood vessel (thrombosis). However, a fistula is not an option for everyone. If your veins are not the right size, a graft may be used instead. Grafts require you to have strong veins. If your veins are not strong enough for a graft, a catheter may be used. Catheters are more likely than fistulas and grafts to become infected or to have  thrombosis. Sometimes, only one type of access is an option. Your health care provider will help you determine which type of access is best for you. How is a vascular access used? The way the access is used depends on the type of access:  If the access is a fistula or graft, two needles are inserted through the skin into the access before each hemodialysis session. Blood leaves the body through one of the needles and travels through a tube to the hemodialysis machine (dialyzer). It then flows through another tube and returns to the body through the second needle.  If the access is a catheter, one tube is connected directly to the tube that leads to the dialyzer and the other is connected to a tube that leads away from the dialyzer. Blood leaves the body through one tube and returns to the body through the other.  What kind of problems can occur with vascular accesses?  Blood clots within a blood vessel (thrombosis). Thrombosis can lead to a narrowing of a blood vessel or tube (stenosis). If thrombosis occurs frequently, another access site may be created as a backup.  Infection. These problems are most likely to occur with a venous catheter and least likely to occur with an arteriovenous fistula. How do I care for my vascular access? Wear a medical alert bracelet. This tells health care providers that you are a dialysis patient in the case of an emergency and   allows them to care for your veins appropriately. If you have a graft or fistula:  A "bruit" is a noise that is heard with a stethoscope and a "thrill" is a vibration felt over the graft or fistula. The presence of the bruit and thrill indicates that the access is working. You will be taught to feel for the thrill each day. If this is not felt, the access may be clotted. Call your health care provider.  You may use the arm where your vascular access is located freely after the site heals. Keep the following in mind: ? Avoid pressure on the  arm. ? Avoid lifting heavy objects with the arm. ? Avoid sleeping on the arm. ? Avoid wearing tight-sleeved shirts or jewelry around the graft or fistula.  Do not allow blood pressure monitoring or needle punctures on the side where the graft or fistula is located.  With permission from your health care provider, you may do exercises to help with blood flow through a fistula. These exercises involve squeezing a rubber ball or other soft objects as instructed.  Contact a health care provider if:  Chills develop.  You have an oral temperature above 102 F (38.9 C).  Swelling around the graft or fistula gets worse.  New pain develops.  Pus or other fluid (drainage) is seen at the vascular access site.  Skin redness or red streaking is seen on the skin around, above, or below the vascular access. Get help right away if:  Pain, numbness, or an unusual pale skin color develops in the hand on the side of your fistula.  Dizziness or weakness develops that you have not had before.  The vascular access has bleeding that cannot be easily controlled. This information is not intended to replace advice given to you by your health care provider. Make sure you discuss any questions you have with your health care provider. Document Released: 08/09/2002 Document Revised: 10/25/2015 Document Reviewed: 10/05/2012 Elsevier Interactive Patient Education  2017 Elsevier Inc.  

## 2017-06-17 DIAGNOSIS — Z23 Encounter for immunization: Secondary | ICD-10-CM | POA: Diagnosis not present

## 2017-06-17 DIAGNOSIS — N2581 Secondary hyperparathyroidism of renal origin: Secondary | ICD-10-CM | POA: Diagnosis not present

## 2017-06-17 DIAGNOSIS — D631 Anemia in chronic kidney disease: Secondary | ICD-10-CM | POA: Diagnosis not present

## 2017-06-17 DIAGNOSIS — Z283 Underimmunization status: Secondary | ICD-10-CM | POA: Diagnosis not present

## 2017-06-17 DIAGNOSIS — N186 End stage renal disease: Secondary | ICD-10-CM | POA: Diagnosis not present

## 2017-06-22 DIAGNOSIS — Z283 Underimmunization status: Secondary | ICD-10-CM | POA: Diagnosis not present

## 2017-06-22 DIAGNOSIS — D631 Anemia in chronic kidney disease: Secondary | ICD-10-CM | POA: Diagnosis not present

## 2017-06-22 DIAGNOSIS — Z23 Encounter for immunization: Secondary | ICD-10-CM | POA: Diagnosis not present

## 2017-06-22 DIAGNOSIS — N2581 Secondary hyperparathyroidism of renal origin: Secondary | ICD-10-CM | POA: Diagnosis not present

## 2017-06-22 DIAGNOSIS — N186 End stage renal disease: Secondary | ICD-10-CM | POA: Diagnosis not present

## 2017-06-24 DIAGNOSIS — Z283 Underimmunization status: Secondary | ICD-10-CM | POA: Diagnosis not present

## 2017-06-24 DIAGNOSIS — N186 End stage renal disease: Secondary | ICD-10-CM | POA: Diagnosis not present

## 2017-06-24 DIAGNOSIS — D631 Anemia in chronic kidney disease: Secondary | ICD-10-CM | POA: Diagnosis not present

## 2017-06-24 DIAGNOSIS — Z23 Encounter for immunization: Secondary | ICD-10-CM | POA: Diagnosis not present

## 2017-06-24 DIAGNOSIS — N2581 Secondary hyperparathyroidism of renal origin: Secondary | ICD-10-CM | POA: Diagnosis not present

## 2017-06-26 DIAGNOSIS — D631 Anemia in chronic kidney disease: Secondary | ICD-10-CM | POA: Diagnosis not present

## 2017-06-26 DIAGNOSIS — N186 End stage renal disease: Secondary | ICD-10-CM | POA: Diagnosis not present

## 2017-06-26 DIAGNOSIS — Z23 Encounter for immunization: Secondary | ICD-10-CM | POA: Diagnosis not present

## 2017-06-26 DIAGNOSIS — N2581 Secondary hyperparathyroidism of renal origin: Secondary | ICD-10-CM | POA: Diagnosis not present

## 2017-06-26 DIAGNOSIS — Z283 Underimmunization status: Secondary | ICD-10-CM | POA: Diagnosis not present

## 2017-06-29 DIAGNOSIS — Z283 Underimmunization status: Secondary | ICD-10-CM | POA: Diagnosis not present

## 2017-06-29 DIAGNOSIS — D631 Anemia in chronic kidney disease: Secondary | ICD-10-CM | POA: Diagnosis not present

## 2017-06-29 DIAGNOSIS — Z23 Encounter for immunization: Secondary | ICD-10-CM | POA: Diagnosis not present

## 2017-06-29 DIAGNOSIS — N186 End stage renal disease: Secondary | ICD-10-CM | POA: Diagnosis not present

## 2017-06-29 DIAGNOSIS — N2581 Secondary hyperparathyroidism of renal origin: Secondary | ICD-10-CM | POA: Diagnosis not present

## 2017-07-01 DIAGNOSIS — Z23 Encounter for immunization: Secondary | ICD-10-CM | POA: Diagnosis not present

## 2017-07-01 DIAGNOSIS — D631 Anemia in chronic kidney disease: Secondary | ICD-10-CM | POA: Diagnosis not present

## 2017-07-01 DIAGNOSIS — N186 End stage renal disease: Secondary | ICD-10-CM | POA: Diagnosis not present

## 2017-07-01 DIAGNOSIS — Z283 Underimmunization status: Secondary | ICD-10-CM | POA: Diagnosis not present

## 2017-07-01 DIAGNOSIS — N2581 Secondary hyperparathyroidism of renal origin: Secondary | ICD-10-CM | POA: Diagnosis not present

## 2017-07-02 DIAGNOSIS — E1129 Type 2 diabetes mellitus with other diabetic kidney complication: Secondary | ICD-10-CM | POA: Diagnosis not present

## 2017-07-02 DIAGNOSIS — Z992 Dependence on renal dialysis: Secondary | ICD-10-CM | POA: Diagnosis not present

## 2017-07-02 DIAGNOSIS — N186 End stage renal disease: Secondary | ICD-10-CM | POA: Diagnosis not present

## 2017-07-03 DIAGNOSIS — Z992 Dependence on renal dialysis: Secondary | ICD-10-CM | POA: Diagnosis not present

## 2017-07-03 DIAGNOSIS — N186 End stage renal disease: Secondary | ICD-10-CM | POA: Diagnosis not present

## 2017-07-03 DIAGNOSIS — D631 Anemia in chronic kidney disease: Secondary | ICD-10-CM | POA: Diagnosis not present

## 2017-07-03 DIAGNOSIS — E1129 Type 2 diabetes mellitus with other diabetic kidney complication: Secondary | ICD-10-CM | POA: Diagnosis not present

## 2017-07-03 DIAGNOSIS — N2581 Secondary hyperparathyroidism of renal origin: Secondary | ICD-10-CM | POA: Diagnosis not present

## 2017-07-06 DIAGNOSIS — Z992 Dependence on renal dialysis: Secondary | ICD-10-CM | POA: Diagnosis not present

## 2017-07-06 DIAGNOSIS — N2581 Secondary hyperparathyroidism of renal origin: Secondary | ICD-10-CM | POA: Diagnosis not present

## 2017-07-06 DIAGNOSIS — D631 Anemia in chronic kidney disease: Secondary | ICD-10-CM | POA: Diagnosis not present

## 2017-07-06 DIAGNOSIS — N186 End stage renal disease: Secondary | ICD-10-CM | POA: Diagnosis not present

## 2017-07-08 DIAGNOSIS — D631 Anemia in chronic kidney disease: Secondary | ICD-10-CM | POA: Diagnosis not present

## 2017-07-08 DIAGNOSIS — N186 End stage renal disease: Secondary | ICD-10-CM | POA: Diagnosis not present

## 2017-07-08 DIAGNOSIS — Z992 Dependence on renal dialysis: Secondary | ICD-10-CM | POA: Diagnosis not present

## 2017-07-08 DIAGNOSIS — N2581 Secondary hyperparathyroidism of renal origin: Secondary | ICD-10-CM | POA: Diagnosis not present

## 2017-07-10 DIAGNOSIS — N2581 Secondary hyperparathyroidism of renal origin: Secondary | ICD-10-CM | POA: Diagnosis not present

## 2017-07-10 DIAGNOSIS — Z992 Dependence on renal dialysis: Secondary | ICD-10-CM | POA: Diagnosis not present

## 2017-07-10 DIAGNOSIS — N186 End stage renal disease: Secondary | ICD-10-CM | POA: Diagnosis not present

## 2017-07-10 DIAGNOSIS — D631 Anemia in chronic kidney disease: Secondary | ICD-10-CM | POA: Diagnosis not present

## 2017-07-13 ENCOUNTER — Encounter: Payer: Self-pay | Admitting: Gastroenterology

## 2017-07-13 ENCOUNTER — Ambulatory Visit (INDEPENDENT_AMBULATORY_CARE_PROVIDER_SITE_OTHER): Payer: Medicare Other | Admitting: Gastroenterology

## 2017-07-13 VITALS — BP 156/50 | HR 74 | Ht 61.0 in | Wt 164.4 lb

## 2017-07-13 DIAGNOSIS — R1032 Left lower quadrant pain: Secondary | ICD-10-CM

## 2017-07-13 DIAGNOSIS — K219 Gastro-esophageal reflux disease without esophagitis: Secondary | ICD-10-CM | POA: Diagnosis not present

## 2017-07-13 DIAGNOSIS — R197 Diarrhea, unspecified: Secondary | ICD-10-CM

## 2017-07-13 DIAGNOSIS — N2581 Secondary hyperparathyroidism of renal origin: Secondary | ICD-10-CM | POA: Diagnosis not present

## 2017-07-13 DIAGNOSIS — N186 End stage renal disease: Secondary | ICD-10-CM | POA: Diagnosis not present

## 2017-07-13 DIAGNOSIS — D631 Anemia in chronic kidney disease: Secondary | ICD-10-CM | POA: Diagnosis not present

## 2017-07-13 DIAGNOSIS — R29898 Other symptoms and signs involving the musculoskeletal system: Secondary | ICD-10-CM

## 2017-07-13 DIAGNOSIS — Z992 Dependence on renal dialysis: Secondary | ICD-10-CM | POA: Diagnosis not present

## 2017-07-13 DIAGNOSIS — Z86010 Personal history of colonic polyps: Secondary | ICD-10-CM

## 2017-07-13 MED ORDER — DICYCLOMINE HCL 10 MG PO CAPS
10.0000 mg | ORAL_CAPSULE | Freq: Three times a day (TID) | ORAL | 11 refills | Status: DC
Start: 2017-07-13 — End: 2017-08-10

## 2017-07-13 NOTE — Patient Instructions (Addendum)
We have sent the following medications to your pharmacy for you to pick up at your convenience: dicyclomine.    You have been scheduled for a CT scan of the abdomen and pelvis at [facility] ([address]---this is in the same building as [facility]).   You are scheduled on 07/21/17 at 2:00pm. You should arrive 15 minutes prior to your appointment time for registration. Please follow the written instructions below on the day of your exam:  WARNING: IF YOU ARE ALLERGIC TO IODINE/X-RAY DYE, PLEASE NOTIFY RADIOLOGY IMMEDIATELY AT [phone]! YOU WILL BE GIVEN A 13 HOUR PREMEDICATION PREP.  1) Do not eat or drink anything after 10:00am (4 hours prior to your test) 2) You have been given 2 bottles of oral contrast to drink. The solution may taste               better if refrigerated, but do NOT add ice or any other liquid to this solution. Shake             well before drinking.    Drink 1 bottle of contrast @ 12:00pm (2 hours prior to your exam)  Drink 1 bottle of contrast @ 1:00pm (1 hour prior to your exam)  You may take any medications as prescribed with a small amount of water except for the following: Metformin, Glucophage, Glucovance, Avandamet, Riomet, Fortamet, Actoplus Met, Janumet, Glumetza or Metaglip. The above medications must be held the day of the exam AND 48 hours after the exam.  The purpose of you drinking the oral contrast is to aid in the visualization of your intestinal tract. The contrast solution may cause some diarrhea. Before your exam is started, you will be given a small amount of fluid to drink. Depending on your individual set of symptoms, you may also receive an intravenous injection of x-ray contrast/dye. Plan on being at [facility] for 30 minutes or longer, depending on the type of exam you are having performed.  This test typically takes 30-45 minutes to complete.  If you have any questions regarding your exam or if you need to  reschedule, you may call the CT department at [phone] between the hours of 8:00 am and 5:00 pm, Monday-Friday.  ________________________________________________________________________  Thank you for choosing me and [facility].  Malcolm T. Stark, Jr., MD., FACG  

## 2017-07-13 NOTE — Progress Notes (Signed)
    History of Present Illness: This is a 77 year old female with GERD, diverticulosis, adenomatous colon polyps, diarrhea, left lower quadrant pain and fecal incontinence.  She has end-stage renal disease maintained on hemodialysis.  She underwent anal manometry in February 2017 showing very weak internal and external anal sphincter as well as abnormal rectal sensation.  She underwent colonoscopy in March 2017 showing 5 colon polyps and severe diverticulosis.  She has had long-term problems with alternating diarrhea with normal bowel movements. Her diarrhea is often associated with left lower abdominal crampy pain and fecal incontinence.  The crampy LLQ pain has worsened over the past several months.  She describes episodes of severe weakness following two episodes of crampy LLQ pain and diarrhea where she was unable to get off the commode and required assistance. She relates numbness and weakness of both lower extremities that lasts for several hours after these 2 episodes.   Abd/pelvic CTA 04/2016  1. No aortic aneurysm or dissection detected. Moderate to severe atherosclerotic changes are seen, particularly in the abdomen. Evaluation of the ascending thoracic aorta is limited due to cardiac motion but there is no definitive evidence of aneurysm or dissection. An ulcerating plaque is seen in the abdominal aorta but no dissection is noted. High-grade stenosis is seen in the proximal internal iliac arteries. 2. There is a mass in the left kidney which could represent a hyperdense cyst or malignancy. An MRI could better evaluate. 3. Left adrenal nodule. Recommend attention to the left adrenal nodule on the recommended MRI.   Current Medications, Allergies, Past Medical History, Past Surgical History, Family History and Social History were reviewed in [employer] record.  Physical Exam: General: Well developed, well nourished, no acute distress Head: Normocephalic and  atraumatic Eyes:  sclerae anicteric, EOMI Ears: Normal auditory acuity Mouth: No deformity or lesions Lungs: Clear throughout to auscultation Heart: Regular rate and rhythm; no murmurs, rubs or bruits Abdomen: Soft, non tender and non distended. No masses, hepatosplenomegaly or hernias noted. Normal Bowel sounds Musculoskeletal: Symmetrical with no gross deformities  Pulses:  Normal pulses noted Extremities: No clubbing, cyanosis, edema or deformities noted Neurological: Alert oriented x 4, grossly nonfocal Psychological:  Alert and cooperative. Normal mood and affect  Assessment and Recommendations:  1.  Diarrhea, LLQ pain.  Possible mesenteric ischemia or IBS-D.  Known severe diverticulosis. Schedule CT of the abdomen/pelvis.  Begin dicyclomine 10 mg 3 times daily.  Imodium 1-2 qid as needed.  Avoid foods that trigger diarrhea or abdominal pain.  REV in 4-6 weeks.   2. Bilateral lower extremity weakness and numbness. Further evaluation by her neurologist.  3.  Fecal incontinence with very weak internal and external sphincter by prior anorectal manometry.  Attempt to avoid diarrhea with use of Imodium and dietary changes.  4. ESRD on HD M-W-F.    5. GERD.  Well controlled on omeprazole 40 mg twice daily.  6.  Personal history of adenomatous colon polyps.  Given her age and comorbidities we will not plan for future surveillance colonoscopies.

## 2017-07-15 DIAGNOSIS — Z992 Dependence on renal dialysis: Secondary | ICD-10-CM | POA: Diagnosis not present

## 2017-07-15 DIAGNOSIS — N186 End stage renal disease: Secondary | ICD-10-CM | POA: Diagnosis not present

## 2017-07-15 DIAGNOSIS — D631 Anemia in chronic kidney disease: Secondary | ICD-10-CM | POA: Diagnosis not present

## 2017-07-15 DIAGNOSIS — N2581 Secondary hyperparathyroidism of renal origin: Secondary | ICD-10-CM | POA: Diagnosis not present

## 2017-07-17 DIAGNOSIS — Z992 Dependence on renal dialysis: Secondary | ICD-10-CM | POA: Diagnosis not present

## 2017-07-17 DIAGNOSIS — N186 End stage renal disease: Secondary | ICD-10-CM | POA: Diagnosis not present

## 2017-07-17 DIAGNOSIS — N2581 Secondary hyperparathyroidism of renal origin: Secondary | ICD-10-CM | POA: Diagnosis not present

## 2017-07-17 DIAGNOSIS — D631 Anemia in chronic kidney disease: Secondary | ICD-10-CM | POA: Diagnosis not present

## 2017-07-20 DIAGNOSIS — D631 Anemia in chronic kidney disease: Secondary | ICD-10-CM | POA: Diagnosis not present

## 2017-07-20 DIAGNOSIS — N2581 Secondary hyperparathyroidism of renal origin: Secondary | ICD-10-CM | POA: Diagnosis not present

## 2017-07-20 DIAGNOSIS — Z992 Dependence on renal dialysis: Secondary | ICD-10-CM | POA: Diagnosis not present

## 2017-07-20 DIAGNOSIS — N186 End stage renal disease: Secondary | ICD-10-CM | POA: Diagnosis not present

## 2017-07-21 ENCOUNTER — Ambulatory Visit (INDEPENDENT_AMBULATORY_CARE_PROVIDER_SITE_OTHER)
Admission: RE | Admit: 2017-07-21 | Discharge: 2017-07-21 | Disposition: A | Discharge status: Home / Self Care | Payer: Medicare Other | Source: Ambulatory Visit | Attending: Gastroenterology | Admitting: Gastroenterology

## 2017-07-21 DIAGNOSIS — K5730 Diverticulosis of large intestine without perforation or abscess without bleeding: Secondary | ICD-10-CM | POA: Diagnosis not present

## 2017-07-21 DIAGNOSIS — R29898 Other symptoms and signs involving the musculoskeletal system: Secondary | ICD-10-CM

## 2017-07-21 DIAGNOSIS — R197 Diarrhea, unspecified: Secondary | ICD-10-CM

## 2017-07-21 DIAGNOSIS — R1032 Left lower quadrant pain: Secondary | ICD-10-CM

## 2017-07-21 DIAGNOSIS — N186 End stage renal disease: Secondary | ICD-10-CM | POA: Diagnosis not present

## 2017-07-21 MED ORDER — IOPAMIDOL (ISOVUE-300) INJECTION 61%
100.0000 mL | Freq: Once | INTRAVENOUS | Status: AC | PRN
  Administered 2017-07-21: 100 mL via INTRAVENOUS

## 2017-07-22 ENCOUNTER — Telehealth: Payer: Self-pay | Admitting: Gastroenterology

## 2017-07-22 DIAGNOSIS — N2581 Secondary hyperparathyroidism of renal origin: Secondary | ICD-10-CM | POA: Diagnosis not present

## 2017-07-22 DIAGNOSIS — N186 End stage renal disease: Secondary | ICD-10-CM | POA: Diagnosis not present

## 2017-07-22 DIAGNOSIS — Z992 Dependence on renal dialysis: Secondary | ICD-10-CM | POA: Diagnosis not present

## 2017-07-22 DIAGNOSIS — D631 Anemia in chronic kidney disease: Secondary | ICD-10-CM | POA: Diagnosis not present

## 2017-07-22 NOTE — Telephone Encounter (Signed)
Have you had the opportunity to look at her CT?

## 2017-07-23 ENCOUNTER — Emergency Department (HOSPITAL_COMMUNITY): Payer: Medicare Other

## 2017-07-23 ENCOUNTER — Other Ambulatory Visit: Payer: Self-pay

## 2017-07-23 ENCOUNTER — Inpatient Hospital Stay (HOSPITAL_COMMUNITY)
Admission: EM | Admit: 2017-07-23 | Discharge: 2017-07-26 | DRG: 190 | Disposition: A | Discharge status: Home Health | Payer: Medicare Other | Attending: Internal Medicine | Admitting: Internal Medicine

## 2017-07-23 ENCOUNTER — Telehealth: Payer: Self-pay | Admitting: Family Medicine

## 2017-07-23 ENCOUNTER — Encounter (HOSPITAL_COMMUNITY): Payer: Self-pay

## 2017-07-23 DIAGNOSIS — M109 Gout, unspecified: Secondary | ICD-10-CM | POA: Diagnosis present

## 2017-07-23 DIAGNOSIS — Z7982 Long term (current) use of aspirin: Secondary | ICD-10-CM

## 2017-07-23 DIAGNOSIS — F324 Major depressive disorder, single episode, in partial remission: Secondary | ICD-10-CM | POA: Diagnosis present

## 2017-07-23 DIAGNOSIS — Z881 Allergy status to other antibiotic agents status: Secondary | ICD-10-CM

## 2017-07-23 DIAGNOSIS — R0602 Shortness of breath: Secondary | ICD-10-CM | POA: Diagnosis not present

## 2017-07-23 DIAGNOSIS — J441 Chronic obstructive pulmonary disease with (acute) exacerbation: Secondary | ICD-10-CM | POA: Diagnosis not present

## 2017-07-23 DIAGNOSIS — F17200 Nicotine dependence, unspecified, uncomplicated: Secondary | ICD-10-CM | POA: Diagnosis present

## 2017-07-23 DIAGNOSIS — I132 Hypertensive heart and chronic kidney disease with heart failure and with stage 5 chronic kidney disease, or end stage renal disease: Secondary | ICD-10-CM | POA: Diagnosis present

## 2017-07-23 DIAGNOSIS — J449 Chronic obstructive pulmonary disease, unspecified: Secondary | ICD-10-CM | POA: Diagnosis present

## 2017-07-23 DIAGNOSIS — I5022 Chronic systolic (congestive) heart failure: Secondary | ICD-10-CM | POA: Diagnosis present

## 2017-07-23 DIAGNOSIS — R0902 Hypoxemia: Secondary | ICD-10-CM | POA: Diagnosis not present

## 2017-07-23 DIAGNOSIS — I2510 Atherosclerotic heart disease of native coronary artery without angina pectoris: Secondary | ICD-10-CM | POA: Diagnosis present

## 2017-07-23 DIAGNOSIS — J9811 Atelectasis: Secondary | ICD-10-CM | POA: Diagnosis not present

## 2017-07-23 DIAGNOSIS — N186 End stage renal disease: Secondary | ICD-10-CM | POA: Diagnosis not present

## 2017-07-23 DIAGNOSIS — E7800 Pure hypercholesterolemia, unspecified: Secondary | ICD-10-CM | POA: Diagnosis present

## 2017-07-23 DIAGNOSIS — I10 Essential (primary) hypertension: Secondary | ICD-10-CM | POA: Diagnosis present

## 2017-07-23 DIAGNOSIS — F17210 Nicotine dependence, cigarettes, uncomplicated: Secondary | ICD-10-CM | POA: Diagnosis present

## 2017-07-23 DIAGNOSIS — Z79899 Other long term (current) drug therapy: Secondary | ICD-10-CM

## 2017-07-23 DIAGNOSIS — K219 Gastro-esophageal reflux disease without esophagitis: Secondary | ICD-10-CM | POA: Diagnosis present

## 2017-07-23 DIAGNOSIS — Z888 Allergy status to other drugs, medicaments and biological substances status: Secondary | ICD-10-CM

## 2017-07-23 DIAGNOSIS — Z880 Allergy status to penicillin: Secondary | ICD-10-CM

## 2017-07-23 DIAGNOSIS — J9621 Acute and chronic respiratory failure with hypoxia: Secondary | ICD-10-CM | POA: Diagnosis present

## 2017-07-23 DIAGNOSIS — Z8673 Personal history of transient ischemic attack (TIA), and cerebral infarction without residual deficits: Secondary | ICD-10-CM

## 2017-07-23 DIAGNOSIS — I447 Left bundle-branch block, unspecified: Secondary | ICD-10-CM | POA: Diagnosis present

## 2017-07-23 DIAGNOSIS — R069 Unspecified abnormalities of breathing: Secondary | ICD-10-CM | POA: Diagnosis not present

## 2017-07-23 DIAGNOSIS — I2699 Other pulmonary embolism without acute cor pulmonale: Secondary | ICD-10-CM

## 2017-07-23 DIAGNOSIS — Z90710 Acquired absence of both cervix and uterus: Secondary | ICD-10-CM

## 2017-07-23 DIAGNOSIS — Z882 Allergy status to sulfonamides status: Secondary | ICD-10-CM

## 2017-07-23 DIAGNOSIS — Z992 Dependence on renal dialysis: Secondary | ICD-10-CM

## 2017-07-23 DIAGNOSIS — J439 Emphysema, unspecified: Secondary | ICD-10-CM | POA: Diagnosis not present

## 2017-07-23 DIAGNOSIS — R079 Chest pain, unspecified: Secondary | ICD-10-CM | POA: Diagnosis present

## 2017-07-23 LAB — BASIC METABOLIC PANEL
ANION GAP: 16 — AB (ref 5–15)
BUN: 16 mg/dL (ref 6–20)
CHLORIDE: 94 mmol/L — AB (ref 101–111)
CO2: 25 mmol/L (ref 22–32)
Calcium: 8.2 mg/dL — ABNORMAL LOW (ref 8.9–10.3)
Creatinine, Ser: 7.04 mg/dL — ABNORMAL HIGH (ref 0.44–1.00)
GFR calc Af Amer: 6 mL/min — ABNORMAL LOW (ref >60)
GFR, EST NON AFRICAN AMERICAN: 5 mL/min — AB (ref >60)
Glucose, Bld: 94 mg/dL (ref 65–99)
POTASSIUM: 3.5 mmol/L (ref 3.5–5.1)
SODIUM: 135 mmol/L (ref 135–145)

## 2017-07-23 LAB — I-STAT TROPONIN, ED: Troponin i, poc: 0.02 ng/mL (ref 0.00–0.08)

## 2017-07-23 LAB — CBC
HEMATOCRIT: 32.7 % — AB (ref 36.0–46.0)
HEMOGLOBIN: 10.6 g/dL — AB (ref 12.0–15.0)
MCH: 29.5 pg (ref 26.0–34.0)
MCHC: 32.4 g/dL (ref 30.0–36.0)
MCV: 91.1 fL (ref 78.0–100.0)
Platelets: 223 10*3/uL (ref 150–400)
RBC: 3.59 MIL/uL — ABNORMAL LOW (ref 3.87–5.11)
RDW: 16.6 % — AB (ref 11.5–15.5)
WBC: 10.3 10*3/uL (ref 4.0–10.5)

## 2017-07-23 LAB — BRAIN NATRIURETIC PEPTIDE: B Natriuretic Peptide: 670 pg/mL — ABNORMAL HIGH (ref 0.0–100.0)

## 2017-07-23 MED ORDER — IPRATROPIUM-ALBUTEROL 0.5-2.5 (3) MG/3ML IN SOLN
3.0000 mL | Freq: Once | RESPIRATORY_TRACT | Status: AC
  Administered 2017-07-24: 3 mL via RESPIRATORY_TRACT
  Filled 2017-07-23: qty 3

## 2017-07-23 MED ORDER — ALBUTEROL SULFATE (2.5 MG/3ML) 0.083% IN NEBU
5.0000 mg | INHALATION_SOLUTION | Freq: Once | RESPIRATORY_TRACT | Status: AC
  Administered 2017-07-23: 5 mg via RESPIRATORY_TRACT
  Filled 2017-07-23: qty 6

## 2017-07-23 MED ORDER — METHYLPREDNISOLONE SODIUM SUCC 125 MG IJ SOLR
125.0000 mg | Freq: Once | INTRAMUSCULAR | Status: AC
  Administered 2017-07-24: 125 mg via INTRAVENOUS
  Filled 2017-07-23: qty 2

## 2017-07-23 NOTE — Telephone Encounter (Signed)
Copied from [place] [id number]. Topic: Quick Communication - See Telephone Encounter >> Jul 23, 2017 12:10 PM Ether Griffins B wrote: CRM for notification. See Telephone encounter for:  Pt needing a statement from Dr. Martinique stating she needs in home health care for social services to be able to provide this for her.  07/23/17.

## 2017-07-23 NOTE — ED Notes (Signed)
Patient oxygen decreases to 86-91% on room air while at rest. Patient re-placed on oxygen at 2 lpm via [place]. Oxygen sats currently 97%. Md aware.

## 2017-07-23 NOTE — ED Triage Notes (Signed)
Per [facility]: Pt from home. Pt had sudden onset difficulty breathing around 6 pm, used inhaler X2 prior to EMS arrival with no relief. Wheezing noted throughout, started on douneb, 10 albuterol, 1 atrovent total. Pt now only having expiratory wheezing in the upper lobes. Pt has dialysis, received full treatment yesterday. Left bundle block on 12 lead.  Pt did mention some chest pain that started the same time as the difficulty breathing. Pt still has some wheezing throughout and is diminished in her lower lobes.

## 2017-07-23 NOTE — ED Notes (Signed)
Daughter of Pt arrived in waiting room and directed to Pt's room.

## 2017-07-23 NOTE — ED Provider Notes (Signed)
[facility] EMERGENCY DEPARTMENT Provider Note   CSN: [id number] Arrival date & time: 07/23/17  1843     History   Chief Complaint Chief Complaint  Patient presents with  . Shortness of Breath  . Chest Pain    HPI [name] is a 77 y.o. female.  77 year old female with extensive past medical history including ESRD on HD, COPD, CHF who presents with shortness of breath.  She reports that she has been feeling short of breath all day and feeling sluggish.  Family members note that she has had several weeks of ongoing cough.  She had a fever to 100.3 one week ago but none since then.  She reports some chest pain which she states occurs mostly at night when she is feeling short of breath while trying to sleep.  She had dialysis yesterday.   The history is provided by the patient and a relative.  Shortness of Breath  Associated symptoms include chest pain.  Chest Pain   Associated symptoms include shortness of breath.    Past Medical History:  Diagnosis Date  . Abnormality of gait 03/28/2015  . Adrenal mass ([place])   . 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 Arthritis   . Back pain   . CHF (congestive heart failure) ([place])   . Chronic kidney disease    Hemo MWF  . Congestion of throat    Pt states she has a lot mucus in back throat.  . Constipation   . COPD (chronic obstructive pulmonary disease) ([place])   . Depression   . Dialysis patient [facility])    kidney  . Diverticulitis   . GERD (gastroesophageal reflux disease)   . H/O hiatal hernia   . High cholesterol   . Hoarseness of voice   . Hyperlipidemia   . Hypertension   . IBS (irritable bowel syndrome)   . Meralgia paresthetica of right side 12/26/2014  . Normal cardiac stress test 12/24/2009   lexiscan, imaging normal  . Renal disorder   . RLS (restless legs syndrome)   . Seizures ([place])    2004 past brain surgery  . Sinus complaint   . Thyroid disease   . TIA  (transient ischemic attack)   . Tubular adenoma of colon 01/2008    Patient Active Problem List   Diagnosis Date Noted  . Weakness 12/23/2016  . Generalized weakness 12/22/2016  . Elevated troponin 12/06/2016  . HCAP (healthcare-associated pneumonia) 12/06/2016  . Hypokalemia 12/06/2016  . Chronic systolic CHF (congestive heart failure) ([place]) 12/06/2016  . Osteopenia 09/08/2016  . Allergic rhinitis 08/07/2016  . BMI 32.0-32.9,adult 08/07/2016  . Onychomycosis 05/09/2016  . Nonspecific chest pain 04/19/2016  . ACS (acute coronary syndrome) ([place]) 04/19/2016  . Leg pain, bilateral 12/05/2015  . Trouble in sleeping 11/22/2015  . End stage renal disease ([place])   . Hyperkalemia 11/12/2015  . ESRD on dialysis ([place])   . Chronic obstructive pulmonary disease ([place])   . Hypertensive urgency 09/20/2015  . Fecal incontinence   . Adverse effects of medication 06/21/2015  . Herpes 06/19/2015  . Skin lesion 06/16/2015  . Recurrent genital herpes 06/16/2015  . Onychocryptosis 04/08/2015  . Unstable gait 03/28/2015  . Renal mass   . End stage renal disease on dialysis ([place])   . Hypertension   . Inadequate pain control 12/30/2014  . CHF (congestive heart failure) ([place]) 12/30/2014  . Meralgia paresthetica of right side 12/26/2014  .  Hereditary and idiopathic peripheral neuropathy 02/15/2013  . Dermatitis of face 08/19/2012  . HIP PAIN, BILATERAL 12/07/2008  . BLADDER PROLAPSE 11/17/2007  . OSTEOARTHRITIS, GENERALIZED, MULTIPLE JOINTS 08/18/2007  . 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 Anxiety and depression 04/23/2007  . ANEMIA NEC 03/31/2007  . TOBACCO ABUSE 12/10/2006  . HIATAL HERNIA 12/10/2006  . Dysphagia 12/10/2006  . HYPERCHOLESTEROLEMIA 07/30/2006  . Gout, unspecified 07/30/2006  . 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 Hypertensive renal disease, malignant, with renal failure 07/30/2006  . GASTROESOPHAGEAL REFLUX, NO ESOPHAGITIS 07/30/2006      Past Surgical History:  Procedure Laterality Date  . ABDOMINAL HYSTERECTOMY    . ANAL RECTAL MANOMETRY N/A 07/25/2015   Procedure: ANO RECTAL MANOMETRY;  Surgeon: Mauri Pole, MD;  Location: [facility];  Service: Endoscopy;  Laterality: N/A;  . APPENDECTOMY    . AV FISTULA PLACEMENT Left 11/11/2012   Procedure: INSERTION OF ARTERIOVENOUS (AV) GORE-TEX GRAFT ARM;  Surgeon: Angelia Mould, MD;  Location: [facility];  Service: Vascular;  Laterality: Left;  . [place] REMOVAL Left 11/18/2012   Procedure: REMOVAL OF LEFT UPPER ARM ARTERIOVENOUS GORETEX GRAFT ([place]);  Surgeon: Angelia Mould, MD;  Location: [facility];  Service: Vascular;  Laterality: Left;  . CARDIAC CATHETERIZATION  2003   normal  . CHOLECYSTECTOMY     Open mid line incision  . frontal craniotomy  2002   indication = sinusitis  . INSERTION OF DIALYSIS CATHETER     Left  . PATCH ANGIOPLASTY Left 11/18/2012   Procedure: PATCH ANGIOPLASTY;  Surgeon: Angelia Mould, MD;  Location: [facility];  Service: Vascular;  Laterality: Left;  . PERIPHERAL VASCULAR CATHETERIZATION Left 03/08/2015   Procedure: A/V Shuntogram/Fistulagram;  Surgeon: Algernon Huxley, MD;  Location: [facility];  Service: Cardiovascular;  Laterality: Left;  . PERIPHERAL VASCULAR CATHETERIZATION Left 03/08/2015   Procedure: A/V Shunt Intervention;  Surgeon: Algernon Huxley, MD;  Location: [facility] CV LAB;  Service: Cardiovascular;  Laterality: Left;  . RECTAL ULTRASOUND N/A 10/05/2015   Procedure: ANAL ULTRASOUND WITH PROBE;  Surgeon: Leighton Ruff, MD;  [facility];  Service: Endoscopy;  Laterality: N/A;  . REVISON OF ARTERIOVENOUS FISTULA  05/11/2012   Procedure: REVISON OF ARTERIOVENOUS FISTULA;  Surgeon: Elam Dutch, MD;  Location: [facility];  Service: Vascular;  Laterality: Right;  . TUBAL LIGATION      OB History    Gravida Para Term Preterm AB Living   5 3 3          SAB TAB Ectopic Multiple Live Births                    Home Medications    Prior to Admission medications   Medication Sig Start Date End Date Taking? Authorizing Provider  acetaminophen (TYLENOL) 500 MG tablet Take 1,000 mg by mouth every 6 (six) hours as needed for mild pain or moderate pain.   Yes [provider]  albuterol (PROVENTIL HFA;VENTOLIN HFA) 108 (90 Base) MCG/ACT inhaler Inhale 2 puffs into the lungs every 4 (four) hours as needed for wheezing or shortness of breath. 03/11/17  Yes Martinique, Betty G, MD  allopurinol (ZYLOPRIM) 100 MG tablet Take 100 mg by mouth once daily on non-dialysis days (Tues/Thurs/Sat/Sun) Patient taking differently: Take 100 mg by mouth See admin instructions. Take one tablet (100 mg) by mouth once daily on non-dialysis days (Tues/Thurs/Sat/Sun) 11/13/15  Yes Smiley Houseman, MD  amLODipine (NORVASC) 10 MG  tablet TAKE 1 TABLET BY MOUTH AT BEDTIME Patient taking differently: TAKE 1 TABLET (10 MG) BY MOUTH AT BEDTIME 06/01/17  Yes Martinique, Betty G, MD  aspirin EC 81 MG tablet Take 81 mg by mouth daily.    Yes [provider]  atorvastatin (LIPITOR) 40 MG tablet Take 1 tablet (40 mg total) by mouth daily. Patient taking differently: Take 40 mg by mouth at bedtime.  02/26/15  Yes Rumley, [place] N, DO  azelastine (ASTELIN) 0.1 % nasal spray Place 1 spray into both nostrils 2 (two) times daily. Use in each nostril as directed Patient taking differently: Place 1 spray into both nostrils 2 (two) times daily as needed for rhinitis or allergies. Use in each nostril as directed 08/07/16  Yes Martinique, Betty G, MD  cinacalcet (SENSIPAR) 90 MG tablet Take 90 mg by mouth at bedtime.   Yes [provider]  cloNIDine (CATAPRES) 0.2 MG tablet Take 1 tablet (0.2 mg total) by mouth 2 (two) times daily. Patient taking differently: Take 0.3 mg by mouth 3 (three) times daily.  12/08/16  Yes Johnson, Clanford L, MD  dicyclomine (BENTYL) 10 MG capsule Take 1 capsule (10 mg total) by mouth 3 (three) times  daily. 07/13/17  Yes Ladene Artist, MD  DULoxetine (CYMBALTA) 30 MG capsule Take 2 capsules (60 mg total) by mouth daily. Patient taking differently: Take 30 mg by mouth at bedtime.  01/08/17  Yes Kathrynn Ducking, MD  fluticasone [facility]) 50 MCG/ACT nasal spray Place 2 sprays into both nostrils daily as needed for allergies or rhinitis. 05/08/16  Yes Rogue Bussing, MD  Homeopathic Products (LEG CRAMP RELIEF PO) Take 1-2 tablets by mouth daily as needed (for leg pain).    Yes [provider]  hydrALAZINE (APRESOLINE) 10 MG tablet Take 1 tablet (10 mg total) by mouth 2 (two) times daily. 11/12/16  Yes Martinique, Betty G, MD  ipratropium (ATROVENT HFA) 17 MCG/ACT inhaler Inhale 2 puffs into the lungs every 4 (four) hours as needed for wheezing. 03/11/17  Yes Martinique, Betty G, MD  lidocaine-prilocaine (EMLA) cream Apply 1 application topically See admin instructions. Apply topically one hour before dialysis - Monday, Wednesday, Friday   Yes [provider]  loratadine (CLARITIN) 10 MG tablet Take 1 tablet (10 mg total) by mouth daily as needed for allergies. 08/09/16  Yes Martinique, Betty G, MD  losartan (COZAAR) 100 MG tablet Take 1 tablet (100 mg total) by mouth daily. 11/12/16  Yes Martinique, Betty G, MD  metoprolol succinate (TOPROL-XL) 100 MG 24 hr tablet TAKE 1 TABLET BY MOUTH TWICE DAILY Patient taking differently: TAKE 1 TABLET (100 MG)  BY MOUTH TWICE DAILY 03/27/17  Yes Martinique, Betty G, MD  multivitamin (RENA-VIT) TABS tablet Take 1 tablet by mouth at bedtime. Patient taking differently: Take 1 tablet by mouth daily.  01/01/15  Yes Haney, Alyssa A, MD  omeprazole (PRILOSEC) 40 MG capsule Take 1 capsule (40 mg total) by mouth 2 (two) times daily. 12/27/15  Yes Ladene Artist, MD  pregabalin (LYRICA) 100 MG capsule Take 100 mg by mouth at bedtime.    Yes [provider]  sevelamer carbonate (RENVELA) 800 MG tablet Take 2,400 mg by mouth See admin instructions. Take 3  tablets (2400 mg) by mouth with each meal or snack - once or twice daily   Yes [provider]    Family History Family History  Problem Relation Age of Onset  . Thyroid cancer Mother   .  Heart disease Father   . Hypertension Father   . Heart attack Father   . Lupus Daughter   . Breast cancer Sister   . Thyroid cancer Sister   . Kidney disease Sister   . Colon cancer Neg Hx     Social History Social History   Tobacco Use  . Smoking status: Current Every Day Smoker    Packs/day: 1.00    Years: 60.00    Pack years: 60.00    Types: Cigarettes  . Smokeless tobacco: Never Used  . Tobacco comment: smoker since 77 yo.  1.5 ppd of [place] 100 lights. ; form given 10-23-16  Substance Use Topics  . Alcohol use: No    Alcohol/week: 0.0 oz  . Drug use: No    Comment: No hx of IV drug use     Allergies   Tuberculin tests; Valacyclovir; Codeine; Penicillins; and Sulfamethoxazole   Review of Systems Review of Systems  Respiratory: Positive for shortness of breath.   Cardiovascular: Positive for chest pain.   All other systems reviewed and are negative except that which was mentioned in HPI   Physical Exam Updated Vital Signs BP (!) 175/56   Pulse 92   Resp (!) 22   SpO2 92%   Physical Exam  Constitutional: She is oriented to person, place, and time. She appears well-developed and well-nourished. No distress.  uncomfortable  HENT:  Head: Normocephalic and atraumatic.  Moist mucous membranes  Eyes: Conjunctivae are normal.  Neck: Neck supple.  Cardiovascular: Normal rate and regular rhythm.  Murmur heard. Pulmonary/Chest:  Increased WOB without respiratory distress; diminished b/l, no obvious wheezing  Abdominal: Soft. Bowel sounds are normal. She exhibits no distension. There is no tenderness.  Musculoskeletal: She exhibits no edema.  Neurological: She is alert and oriented to person, place, and time.  Fluent speech  Skin: Skin is warm and dry.   Psychiatric: She has a normal mood and affect. Judgment normal.  Nursing note and vitals reviewed.    ED Treatments / Results  Labs (all labs ordered are listed, but only abnormal results are displayed) Labs Reviewed  BASIC METABOLIC PANEL - Abnormal; Notable for the following components:      Result Value   Chloride 94 (*)    Creatinine, Ser 7.04 (*)    Calcium 8.2 (*)    GFR calc non Af Amer 5 (*)    GFR calc Af Amer 6 (*)    Anion gap 16 (*)    All other components within normal limits  CBC - Abnormal; Notable for the following components:   RBC 3.59 (*)    Hemoglobin 10.6 (*)    HCT 32.7 (*)    RDW 16.6 (*)    All other components within normal limits  BRAIN NATRIURETIC PEPTIDE - Abnormal; Notable for the following components:   B Natriuretic Peptide 670.0 (*)    All other components within normal limits  I-STAT TROPONIN, ED    EKG  EKG Interpretation  Date/Time:  Thursday July 23 2017 19:10:39 EST Ventricular Rate:  92 PR Interval:    QRS Duration: 149 QT Interval:  422 QTC Calculation: 523 R Axis:   67 Text Interpretation:  Sinus rhythm Left bundle branch block LBBB new from previous Confirmed by Theotis Burrow [phone]) on 07/23/2017 7:44:58 PM       Radiology Dg Chest 2 View  Result Date: 07/23/2017 CLINICAL DATA:  Difficulty breathing. EXAM: CHEST  2 VIEW COMPARISON:  12/22/2016. FINDINGS: Low volume film  with stable asymmetric elevation right hemidiaphragm. Atelectasis noted in the lungs bilaterally, right greater than left. No pulmonary edema or pleural effusion. HeRo dialysis catheter evident. Telemetry leads overlie the chest. The visualized bony structures of the thorax are intact. IMPRESSION: Bibasilar atelectasis without pulmonary edema or pleural effusion. Electronically Signed   By: Misty Stanley M.D.   On: 07/23/2017 20:02   Ct Chest Wo Contrast  Result Date: 07/23/2017 CLINICAL DATA:  Chronic dyspnea with wheeze and right-sided chest pain  beginning today. EXAM: CT CHEST WITHOUT CONTRAST TECHNIQUE: Multidetector CT imaging of the chest was performed following the standard protocol without IV contrast. COMPARISON:  Same day CXR FINDINGS: Cardiovascular: Cardiomegaly without pericardial effusion. Moderate aortic atherosclerosis without aneurysm. Mild dilatation of the main pulmonary artery compatible with a component of chronic pulmonary hypertension. Left main and three-vessel coronary arteriosclerosis. Large-bore left IJ catheter is seen with tip in the right atrium. Mediastinum/Nodes: 15 mm short axis right lower paratracheal lymph node likely reactive. Smaller right upper paratracheal and prevascular lymph nodes are present. Lungs/Pleura: Mild centrilobular emphysema. Bilateral peribronchial and peribronchiolar thickening consistent with chronic bronchitic change more so to both lower lobes and right middle lobe. Subpleural areas of atelectasis are noted in both lung bases. No effusion or pneumothorax. Upper Abdomen: Moderate renal cortical scarring of the visualized right upper pole of the kidney with small parapelvic and cortical cysts noted. There is colonic diverticulosis of the included large bowel without diverticulitis. Status post cholecystectomy. Hypodense nodule of the left adrenal apex may reflect a small benign adenoma measuring 13 mm. Musculoskeletal: Thoracolumbar spondylosis. 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 IMPRESSION: 1. Bronchial and peribronchial thickening compatible with chronic bronchitic change. Bibasilar atelectasis is noted. 2. Cardiomegaly with coronary arteriosclerosis and aortic atherosclerosis. 3. Centrilobular emphysema, upper lobe predominant. 4. Left adrenal nodule compatible with adenoma measuring 13 mm. 5. Moderate right-sided renal cortical scarring with renal cysts. 6. Colonic diverticulosis without acute diverticulitis. Aortic Atherosclerosis (ICD10-I70.0) and  Emphysema (ICD10-J43.9). Electronically Signed   By: Ashley Royalty M.D.   On: 07/23/2017 21:26    Procedures Procedures (including critical care time)  Medications Ordered in ED Medications  ipratropium-albuterol (DUONEB) 0.5-2.5 (3) MG/3ML nebulizer solution 3 mL (not administered)  methylPREDNISolone sodium succinate (SOLU-MEDROL) 125 mg/2 mL injection 125 mg (not administered)  albuterol (PROVENTIL) (2.5 MG/3ML) 0.083% nebulizer solution 5 mg (5 mg Nebulization Given 07/23/17 1941)     Initial Impression / Assessment and Plan / ED Course  I have reviewed the triage vital signs and the nursing notes.  Pertinent labs & imaging results that were available during my care of the patient were reviewed by me and considered in my medical decision making (see chart for details).     Pt w/ ESRD, CHF, COPD w/ SOB today.  She was requiring 2L supplemental O2. No obvious wheezing. EKG shows new LBBB. Initial trop neg, potassium 3.5, BNP 670, hemoglobin 10.6.  Chest x-ray without obvious infiltrate or edema.  Obtained CT of chest to evaluate for occult pneumonia or fluid.  CT shows chronic bronchitis changes with no other acute findings.  After receiving DuoNeb, the patient states that her breathing is improved, I have noted some increase in end expiratory wheezing although she remains very diminished.  She has a 2 L oxygen requirement which is new.  I have ordered Solu-Medrol to treat COPD.  Given her new oxygen requirement and new LBBB, discussed admission with Dr. Blaine Hamper, hospitalist. I have also consulted cardiology for  evaluation of new LBBB.  Final Clinical Impressions(s) / ED Diagnoses   Final diagnoses:  None    ED Discharge Orders    None       Deyonte Cadden, Wenda Overland, MD 07/24/17 0010

## 2017-07-23 NOTE — ED Notes (Signed)
Patient transported to CT 

## 2017-07-24 ENCOUNTER — Inpatient Hospital Stay (HOSPITAL_COMMUNITY): Payer: Medicare Other

## 2017-07-24 DIAGNOSIS — I447 Left bundle-branch block, unspecified: Secondary | ICD-10-CM

## 2017-07-24 DIAGNOSIS — E7800 Pure hypercholesterolemia, unspecified: Secondary | ICD-10-CM

## 2017-07-24 DIAGNOSIS — Z992 Dependence on renal dialysis: Secondary | ICD-10-CM

## 2017-07-24 DIAGNOSIS — Z880 Allergy status to penicillin: Secondary | ICD-10-CM | POA: Diagnosis not present

## 2017-07-24 DIAGNOSIS — I120 Hypertensive chronic kidney disease with stage 5 chronic kidney disease or end stage renal disease: Secondary | ICD-10-CM | POA: Diagnosis not present

## 2017-07-24 DIAGNOSIS — R079 Chest pain, unspecified: Secondary | ICD-10-CM | POA: Diagnosis not present

## 2017-07-24 DIAGNOSIS — I10 Essential (primary) hypertension: Secondary | ICD-10-CM

## 2017-07-24 DIAGNOSIS — F17210 Nicotine dependence, cigarettes, uncomplicated: Secondary | ICD-10-CM | POA: Diagnosis present

## 2017-07-24 DIAGNOSIS — R0602 Shortness of breath: Secondary | ICD-10-CM

## 2017-07-24 DIAGNOSIS — F324 Major depressive disorder, single episode, in partial remission: Secondary | ICD-10-CM

## 2017-07-24 DIAGNOSIS — Z7982 Long term (current) use of aspirin: Secondary | ICD-10-CM | POA: Diagnosis not present

## 2017-07-24 DIAGNOSIS — N186 End stage renal disease: Secondary | ICD-10-CM

## 2017-07-24 DIAGNOSIS — Z881 Allergy status to other antibiotic agents status: Secondary | ICD-10-CM | POA: Diagnosis not present

## 2017-07-24 DIAGNOSIS — J441 Chronic obstructive pulmonary disease with (acute) exacerbation: Secondary | ICD-10-CM | POA: Diagnosis not present

## 2017-07-24 DIAGNOSIS — N2581 Secondary hyperparathyroidism of renal origin: Secondary | ICD-10-CM | POA: Diagnosis not present

## 2017-07-24 DIAGNOSIS — Z8673 Personal history of transient ischemic attack (TIA), and cerebral infarction without residual deficits: Secondary | ICD-10-CM | POA: Diagnosis not present

## 2017-07-24 DIAGNOSIS — J9621 Acute and chronic respiratory failure with hypoxia: Secondary | ICD-10-CM | POA: Diagnosis not present

## 2017-07-24 DIAGNOSIS — K219 Gastro-esophageal reflux disease without esophagitis: Secondary | ICD-10-CM

## 2017-07-24 DIAGNOSIS — I132 Hypertensive heart and chronic kidney disease with heart failure and with stage 5 chronic kidney disease, or end stage renal disease: Secondary | ICD-10-CM | POA: Diagnosis present

## 2017-07-24 DIAGNOSIS — D631 Anemia in chronic kidney disease: Secondary | ICD-10-CM | POA: Diagnosis not present

## 2017-07-24 DIAGNOSIS — M109 Gout, unspecified: Secondary | ICD-10-CM | POA: Diagnosis present

## 2017-07-24 DIAGNOSIS — Z90710 Acquired absence of both cervix and uterus: Secondary | ICD-10-CM | POA: Diagnosis not present

## 2017-07-24 DIAGNOSIS — Z882 Allergy status to sulfonamides status: Secondary | ICD-10-CM | POA: Diagnosis not present

## 2017-07-24 DIAGNOSIS — F17200 Nicotine dependence, unspecified, uncomplicated: Secondary | ICD-10-CM

## 2017-07-24 DIAGNOSIS — Z79899 Other long term (current) drug therapy: Secondary | ICD-10-CM | POA: Diagnosis not present

## 2017-07-24 DIAGNOSIS — I340 Nonrheumatic mitral (valve) insufficiency: Secondary | ICD-10-CM | POA: Diagnosis not present

## 2017-07-24 DIAGNOSIS — I5022 Chronic systolic (congestive) heart failure: Secondary | ICD-10-CM | POA: Diagnosis present

## 2017-07-24 DIAGNOSIS — Z888 Allergy status to other drugs, medicaments and biological substances status: Secondary | ICD-10-CM | POA: Diagnosis not present

## 2017-07-24 DIAGNOSIS — I2510 Atherosclerotic heart disease of native coronary artery without angina pectoris: Secondary | ICD-10-CM | POA: Diagnosis present

## 2017-07-24 LAB — TROPONIN I
TROPONIN I: 0.11 ng/mL — AB (ref <0.03)
TROPONIN I: 0.12 ng/mL — AB (ref <0.03)
Troponin I: 0.06 ng/mL (ref <0.03)

## 2017-07-24 LAB — LIPID PANEL
CHOLESTEROL: 84 mg/dL (ref 0–200)
HDL: 46 mg/dL (ref >40)
LDL Cholesterol: 24 mg/dL (ref 0–99)
TRIGLYCERIDES: 69 mg/dL (ref <150)
Total CHOL/HDL Ratio: 1.8 RATIO
VLDL: 14 mg/dL (ref 0–40)

## 2017-07-24 LAB — HEMOGLOBIN A1C
HEMOGLOBIN A1C: 5.5 % (ref 4.8–5.6)
MEAN PLASMA GLUCOSE: 111.15 mg/dL

## 2017-07-24 LAB — ECHOCARDIOGRAM COMPLETE

## 2017-07-24 LAB — INFLUENZA PANEL BY PCR (TYPE A & B)
INFLAPCR: NEGATIVE
INFLBPCR: NEGATIVE

## 2017-07-24 MED ORDER — NICOTINE 21 MG/24HR TD PT24
21.0000 mg | MEDICATED_PATCH | Freq: Every day | TRANSDERMAL | Status: DC
  Filled 2017-07-24 (×2): qty 1

## 2017-07-24 MED ORDER — RENA-VITE PO TABS
1.0000 | ORAL_TABLET | Freq: Every day | ORAL | Status: DC
  Administered 2017-07-24 – 2017-07-25 (×3): 1 via ORAL
  Filled 2017-07-24 (×3): qty 1

## 2017-07-24 MED ORDER — ASPIRIN EC 81 MG PO TBEC
81.0000 mg | DELAYED_RELEASE_TABLET | Freq: Every day | ORAL | Status: DC
  Administered 2017-07-24 – 2017-07-26 (×3): 81 mg via ORAL
  Filled 2017-07-24 (×3): qty 1

## 2017-07-24 MED ORDER — HYDRALAZINE HCL 10 MG PO TABS
10.0000 mg | ORAL_TABLET | Freq: Two times a day (BID) | ORAL | Status: DC
  Administered 2017-07-24 – 2017-07-26 (×5): 10 mg via ORAL
  Filled 2017-07-24 (×5): qty 1

## 2017-07-24 MED ORDER — LIDOCAINE-PRILOCAINE 2.5-2.5 % EX CREA: 1.0000 "application " | TOPICAL_CREAM | CUTANEOUS | Status: DC

## 2017-07-24 MED ORDER — SEVELAMER CARBONATE 800 MG PO TABS
2400.0000 mg | ORAL_TABLET | Freq: Three times a day (TID) | ORAL | Status: DC
  Administered 2017-07-25 – 2017-07-26 (×4): 2400 mg via ORAL
  Filled 2017-07-24 (×5): qty 3

## 2017-07-24 MED ORDER — IPRATROPIUM-ALBUTEROL 0.5-2.5 (3) MG/3ML IN SOLN
3.0000 mL | Freq: Four times a day (QID) | RESPIRATORY_TRACT | Status: DC
  Administered 2017-07-25 – 2017-07-26 (×4): 3 mL via RESPIRATORY_TRACT
  Filled 2017-07-24 (×4): qty 3

## 2017-07-24 MED ORDER — LORATADINE 10 MG PO TABS: 10.0000 mg | ORAL_TABLET | Freq: Every day | ORAL | Status: DC | PRN

## 2017-07-24 MED ORDER — ALLOPURINOL 100 MG PO TABS
100.0000 mg | ORAL_TABLET | ORAL | Status: DC
  Administered 2017-07-26: 100 mg via ORAL
  Filled 2017-07-24 (×2): qty 1

## 2017-07-24 MED ORDER — DULOXETINE HCL 60 MG PO CPEP
60.0000 mg | ORAL_CAPSULE | Freq: Every day | ORAL | Status: DC
  Administered 2017-07-24 – 2017-07-26 (×3): 60 mg via ORAL
  Filled 2017-07-24 (×3): qty 1

## 2017-07-24 MED ORDER — CLONIDINE HCL 0.2 MG PO TABS
0.3000 mg | ORAL_TABLET | Freq: Three times a day (TID) | ORAL | Status: DC
  Administered 2017-07-24 – 2017-07-26 (×6): 0.3 mg via ORAL
  Filled 2017-07-24 (×6): qty 1

## 2017-07-24 MED ORDER — CINACALCET HCL 30 MG PO TABS
90.0000 mg | ORAL_TABLET | Freq: Every day | ORAL | Status: DC
  Administered 2017-07-25: 90 mg via ORAL
  Filled 2017-07-24 (×3): qty 3

## 2017-07-24 MED ORDER — PREGABALIN 100 MG PO CAPS
100.0000 mg | ORAL_CAPSULE | Freq: Every day | ORAL | Status: DC
  Administered 2017-07-24 – 2017-07-25 (×3): 100 mg via ORAL
  Filled 2017-07-24 (×3): qty 1

## 2017-07-24 MED ORDER — IPRATROPIUM-ALBUTEROL 0.5-2.5 (3) MG/3ML IN SOLN
3.0000 mL | RESPIRATORY_TRACT | Status: DC
  Administered 2017-07-24 (×3): 3 mL via RESPIRATORY_TRACT
  Filled 2017-07-24 (×4): qty 3

## 2017-07-24 MED ORDER — METOPROLOL SUCCINATE ER 100 MG PO TB24
100.0000 mg | ORAL_TABLET | Freq: Two times a day (BID) | ORAL | Status: DC
  Administered 2017-07-24 – 2017-07-26 (×5): 100 mg via ORAL
  Filled 2017-07-24 (×5): qty 1

## 2017-07-24 MED ORDER — AZITHROMYCIN 500 MG PO TABS
250.0000 mg | ORAL_TABLET | Freq: Every day | ORAL | Status: DC
  Filled 2017-07-24 (×2): qty 1

## 2017-07-24 MED ORDER — HEPARIN SODIUM (PORCINE) 5000 UNIT/ML IJ SOLN
5000.0000 [IU] | Freq: Three times a day (TID) | INTRAMUSCULAR | Status: DC
  Administered 2017-07-24 – 2017-07-26 (×6): 5000 [IU] via SUBCUTANEOUS
  Filled 2017-07-24 (×7): qty 1

## 2017-07-24 MED ORDER — ONDANSETRON HCL 4 MG/2ML IJ SOLN
4.0000 mg | Freq: Four times a day (QID) | INTRAMUSCULAR | Status: DC | PRN
  Administered 2017-07-24: 4 mg via INTRAVENOUS
  Filled 2017-07-24: qty 2

## 2017-07-24 MED ORDER — AZITHROMYCIN 500 MG PO TABS
500.0000 mg | ORAL_TABLET | Freq: Every day | ORAL | Status: AC
  Administered 2017-07-24: 500 mg via ORAL
  Filled 2017-07-24: qty 1

## 2017-07-24 MED ORDER — LEG CRAMP RELIEF PO TABS: ORAL_TABLET | Freq: Every day | ORAL | Status: DC | PRN

## 2017-07-24 MED ORDER — DOXERCALCIFEROL 4 MCG/2ML IV SOLN: 2.0000 ug | INTRAVENOUS | Status: DC

## 2017-07-24 MED ORDER — AZELASTINE HCL 0.1 % NA SOLN: 1.0000 | Freq: Two times a day (BID) | NASAL | Status: DC | PRN

## 2017-07-24 MED ORDER — DM-GUAIFENESIN ER 30-600 MG PO TB12
1.0000 | ORAL_TABLET | Freq: Two times a day (BID) | ORAL | Status: DC | PRN
  Administered 2017-07-24: 1 via ORAL
  Filled 2017-07-24: qty 1

## 2017-07-24 MED ORDER — ATORVASTATIN CALCIUM 40 MG PO TABS
40.0000 mg | ORAL_TABLET | Freq: Every day | ORAL | Status: DC
  Administered 2017-07-24 – 2017-07-25 (×3): 40 mg via ORAL
  Filled 2017-07-24 (×3): qty 1

## 2017-07-24 MED ORDER — DICYCLOMINE HCL 10 MG PO CAPS
10.0000 mg | ORAL_CAPSULE | Freq: Three times a day (TID) | ORAL | Status: DC
  Administered 2017-07-24 – 2017-07-26 (×6): 10 mg via ORAL
  Filled 2017-07-24 (×6): qty 1

## 2017-07-24 MED ORDER — MORPHINE SULFATE (PF) 4 MG/ML IV SOLN
2.0000 mg | INTRAVENOUS | Status: DC | PRN
  Filled 2017-07-24: qty 1

## 2017-07-24 MED ORDER — PANTOPRAZOLE SODIUM 40 MG PO TBEC
40.0000 mg | DELAYED_RELEASE_TABLET | Freq: Every day | ORAL | Status: DC
  Administered 2017-07-24 – 2017-07-26 (×3): 40 mg via ORAL
  Filled 2017-07-24 (×3): qty 1

## 2017-07-24 MED ORDER — NITROGLYCERIN 0.4 MG SL SUBL: 0.4000 mg | SUBLINGUAL_TABLET | SUBLINGUAL | Status: DC | PRN

## 2017-07-24 MED ORDER — ALBUTEROL SULFATE (2.5 MG/3ML) 0.083% IN NEBU: 2.5000 mg | INHALATION_SOLUTION | RESPIRATORY_TRACT | Status: DC | PRN

## 2017-07-24 MED ORDER — METHYLPREDNISOLONE SODIUM SUCC 125 MG IJ SOLR
60.0000 mg | Freq: Two times a day (BID) | INTRAMUSCULAR | Status: DC
  Administered 2017-07-24 – 2017-07-25 (×3): 60 mg via INTRAVENOUS
  Filled 2017-07-24 (×3): qty 2

## 2017-07-24 MED ORDER — LOSARTAN POTASSIUM 50 MG PO TABS
100.0000 mg | ORAL_TABLET | Freq: Every day | ORAL | Status: DC
  Administered 2017-07-24 – 2017-07-26 (×3): 100 mg via ORAL
  Filled 2017-07-24 (×3): qty 2

## 2017-07-24 MED ORDER — AMLODIPINE BESYLATE 10 MG PO TABS
10.0000 mg | ORAL_TABLET | Freq: Every day | ORAL | Status: DC
  Administered 2017-07-24 – 2017-07-25 (×2): 10 mg via ORAL
  Filled 2017-07-24 (×2): qty 1

## 2017-07-24 MED ORDER — FLUTICASONE PROPIONATE 50 MCG/ACT NA SUSP: 2.0000 | Freq: Every day | NASAL | Status: DC | PRN

## 2017-07-24 MED ORDER — ACETAMINOPHEN 500 MG PO TABS
1000.0000 mg | ORAL_TABLET | Freq: Four times a day (QID) | ORAL | Status: DC | PRN
  Administered 2017-07-25: 1000 mg via ORAL
  Filled 2017-07-24: qty 2

## 2017-07-24 NOTE — Care Management Note (Signed)
Case Management Note  Patient Details  Name: [name] MRN: [id number] Date of Birth: Oct 09, 1940  Subjective/Objective:   Acute on chronic Resp Failure with Hypoxia               Action/Plan: Patient lives at home; PCP: Martinique, Betty G, MD; has private insurance with Medicare; on Hemodialysis; CM following for progression of care.   Expected Discharge Date:    possibly 07/28/2017              Expected Discharge Plan:  [place]  In-House Referral:    [facility] Discharge planning Services  CM Consult  Status of Service:  In process, will continue to follow  Sherrilyn Rist [phone] 07/24/2017, 10:35 AM

## 2017-07-24 NOTE — Progress Notes (Signed)
Patient is a 77 y.o admitted for SOB. She is a dialysis patient of [facility] with AG graft on the LUA.  Paitent arrived into the unit at about 2:10am from ED. Patient is alert and oriented x 4.demonstrated generalized weakness.  Skin dry, warm and intact. Assessment completed, no sign of distress noted. Patient is made comfortable and room temperature adjusted and warm blanket provided. Will continue to monitor

## 2017-07-24 NOTE — H&P (Signed)
History and Physical    [name]:[id number] DOB: 15-May-1941 DOA: 07/23/2017  Referring MD/NP/PA:   PCP: Martinique, Betty G, MD   Patient coming from:  The patient is coming from home.  At baseline, pt is independent for most of ADL.   Chief Complaint: SOB and chest pain  HPI: [name] is a 77 y.o. female with medical history significant of ESRD-HD (MWF), hypertension, hyperlipidemia, COPD, TIA, GERD, gout, depression, seizure, diverticulitis, anemia, adrenal mass, tobacco abuse, CAD, who presents with shortness breath and chest pain.  Patient states that her shortness breath is started in about 6 PM, which has been progressively getting worse. She has cough with yellow colored sputum production. Patient was found to have oxygen desaturation to 86% on room air. Patient reports chest pain which started yesterday. She states that her chest pain is located in the frontal chest, constant, moderate, nonradiating. She has frontal chest wall tenderness on examination. Patient does not have fever or chills. Patient denies nausea, vomiting, diarrhea, abdominal pain. She states that sometimes it she has dysuria, but cannot currently.  ED Course: pt was found to have WBC 10.3, troponin 0.06, potassium 3.5, bicarbonate 25, creatinine 7.04, BUN 16, temperature 97.5, tachypnea, negative chest x-ray. CT of the chest with contrast that showed chronic bronchitic change and emphysema. Patient is admitted to telemetry bed as inpatient. Patient was found to have new left bundle blockage on EKG. Cardiology was consulted.  Review of Systems:   General: no fevers, chills, has poor appetite, has fatigue HEENT: no blurry vision, hearing changes or sore throat Respiratory: has dyspnea, coughing, wheezing CV: has chest pain, no palpitations GI: no nausea, vomiting, abdominal pain, diarrhea, constipation GU: no dysuria, burning on urination, increased urinary frequency, hematuria  Ext: no leg edema Neuro: no  unilateral weakness, numbness, or tingling, no vision change or hearing loss Skin: no rash, no skin tear. MSK: No muscle spasm, no deformity, no limitation of range of movement in spin Heme: No easy bruising.  Travel history: No recent long distant travel.  Allergy:  Allergies  Allergen Reactions  . Tuberculin Tests Hives    "blisters"  . Valacyclovir Other (See Comments)    Confusion and nervousness  . Codeine Nausea And Vomiting  . Penicillins Rash    No problems breathing. Has tolerated omnicef in past without issue Has patient had a PCN reaction causing immediate rash, facial/tongue/throat swelling, SOB or lightheadedness with hypotension: Yes Has patient had a PCN reaction causing severe rash involving mucus membranes or skin necrosis: No Has patient had a PCN reaction that required hospitalization No Has patient had a PCN reaction occurring within the last 10 years: No If all of the above answers are "NO", then may proceed with Cephalosporin  . Sulfamethoxazole Rash    Past Medical History:  Diagnosis Date  . Abnormality of gait 03/28/2015  . Adrenal mass ([place])   . 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 Arthritis   . Back pain   . CHF (congestive heart failure) ([place])   . Chronic kidney disease    Hemo MWF  . Congestion of throat    Pt states she has a lot mucus in back throat.  . Constipation   . COPD (chronic obstructive pulmonary disease) ([place])   . Depression   . Dialysis patient [facility])    kidney  . Diverticulitis   . GERD (gastroesophageal reflux disease)   . H/O  hiatal hernia   . High cholesterol   . Hoarseness of voice   . Hyperlipidemia   . Hypertension   . IBS (irritable bowel syndrome)   . Meralgia paresthetica of right side 12/26/2014  . Normal cardiac stress test 12/24/2009   lexiscan, imaging normal  . Renal disorder   . RLS (restless legs syndrome)   . Seizures ([place])    2004 past brain surgery  . Sinus  complaint   . Thyroid disease   . TIA (transient ischemic attack)   . Tubular adenoma of colon 01/2008    Past Surgical History:  Procedure Laterality Date  . ABDOMINAL HYSTERECTOMY    . ANAL RECTAL MANOMETRY N/A 07/25/2015   Procedure: ANO RECTAL MANOMETRY;  Surgeon: Mauri Pole, MD;  Location: [facility];  Service: Endoscopy;  Laterality: N/A;  . APPENDECTOMY    . AV FISTULA PLACEMENT Left 11/11/2012   Procedure: INSERTION OF ARTERIOVENOUS (AV) GORE-TEX GRAFT ARM;  Surgeon: Angelia Mould, MD;  Location: [place];  Service: Vascular;  Laterality: Left;  . [place] REMOVAL Left 11/18/2012   Procedure: REMOVAL OF LEFT UPPER ARM ARTERIOVENOUS GORETEX GRAFT ([place]);  Surgeon: Angelia Mould, MD;  Location: [place];  Service: Vascular;  Laterality: Left;  . CARDIAC CATHETERIZATION  2003   normal  . CHOLECYSTECTOMY     Open mid line incision  . frontal craniotomy  2002   indication = sinusitis  . INSERTION OF DIALYSIS CATHETER     Left  . PATCH ANGIOPLASTY Left 11/18/2012   Procedure: PATCH ANGIOPLASTY;  Surgeon: Angelia Mould, MD;  Location: [place];  Service: Vascular;  Laterality: Left;  . PERIPHERAL VASCULAR CATHETERIZATION Left 03/08/2015   Procedure: A/V Shuntogram/Fistulagram;  Surgeon: Algernon Huxley, MD;  Location: [place] CV LAB;  Service: Cardiovascular;  Laterality: Left;  . PERIPHERAL VASCULAR CATHETERIZATION Left 03/08/2015   Procedure: A/V Shunt Intervention;  Surgeon: Algernon Huxley, MD;  Location: [place] CV LAB;  Service: Cardiovascular;  Laterality: Left;  . RECTAL ULTRASOUND N/A 10/05/2015   Procedure: ANAL ULTRASOUND WITH PROBE;  Surgeon: Leighton Ruff, MD;  [facility];  Service: Endoscopy;  Laterality: N/A;  . REVISON OF ARTERIOVENOUS FISTULA  05/11/2012   Procedure: REVISON OF ARTERIOVENOUS FISTULA;  Surgeon: Elam Dutch, MD;  Location: [place];  Service: Vascular;  Laterality: Right;  . TUBAL LIGATION      Social History:   reports that she has been smoking cigarettes.  She has a 60.00 pack-year smoking history. she has never used smokeless tobacco. She reports that she does not drink alcohol or use drugs.  Family History:  Family History  Problem Relation Age of Onset  . Thyroid cancer Mother   . Heart disease Father   . Hypertension Father   . Heart attack Father   . Lupus Daughter   . Breast cancer Sister   . Thyroid cancer Sister   . Kidney disease Sister   . Colon cancer Neg Hx      Prior to Admission medications   Medication Sig Start Date End Date Taking? Authorizing Provider  acetaminophen (TYLENOL) 500 MG tablet Take 1,000 mg by mouth every 6 (six) hours as needed for mild pain or moderate pain.   Yes [provider]  albuterol (PROVENTIL HFA;VENTOLIN HFA) 108 (90 Base) MCG/ACT inhaler Inhale 2 puffs into the lungs every 4 (four) hours as needed for wheezing or shortness of breath. 03/11/17  Yes Martinique, Betty G, MD  allopurinol (ZYLOPRIM) 100  MG tablet Take 100 mg by mouth once daily on non-dialysis days (Tues/Thurs/Sat/Sun) Patient taking differently: Take 100 mg by mouth See admin instructions. Take one tablet (100 mg) by mouth once daily on non-dialysis days (Tues/Thurs/Sat/Sun) 11/13/15  Yes Smiley Houseman, MD  amLODipine (NORVASC) 10 MG tablet TAKE 1 TABLET BY MOUTH AT BEDTIME Patient taking differently: TAKE 1 TABLET (10 MG) BY MOUTH AT BEDTIME 06/01/17  Yes Martinique, Betty G, MD  aspirin EC 81 MG tablet Take 81 mg by mouth daily.    Yes [provider]  atorvastatin (LIPITOR) 40 MG tablet Take 1 tablet (40 mg total) by mouth daily. Patient taking differently: Take 40 mg by mouth at bedtime.  02/26/15  Yes Rumley, [place] N, DO  azelastine (ASTELIN) 0.1 % nasal spray Place 1 spray into both nostrils 2 (two) times daily. Use in each nostril as directed Patient taking differently: Place 1 spray into both nostrils 2 (two) times daily as needed for rhinitis or allergies. Use in  each nostril as directed 08/07/16  Yes Martinique, Betty G, MD  cinacalcet (SENSIPAR) 90 MG tablet Take 90 mg by mouth at bedtime.   Yes [provider]  cloNIDine (CATAPRES) 0.2 MG tablet Take 1 tablet (0.2 mg total) by mouth 2 (two) times daily. Patient taking differently: Take 0.3 mg by mouth 3 (three) times daily.  12/08/16  Yes Johnson, Clanford L, MD  dicyclomine (BENTYL) 10 MG capsule Take 1 capsule (10 mg total) by mouth 3 (three) times daily. 07/13/17  Yes Ladene Artist, MD  DULoxetine (CYMBALTA) 30 MG capsule Take 2 capsules (60 mg total) by mouth daily. Patient taking differently: Take 30 mg by mouth at bedtime.  01/08/17  Yes Kathrynn Ducking, MD  fluticasone [facility]) 50 MCG/ACT nasal spray Place 2 sprays into both nostrils daily as needed for allergies or rhinitis. 05/08/16  Yes Rogue Bussing, MD  Homeopathic Products (LEG CRAMP RELIEF PO) Take 1-2 tablets by mouth daily as needed (for leg pain).    Yes [provider]  hydrALAZINE (APRESOLINE) 10 MG tablet Take 1 tablet (10 mg total) by mouth 2 (two) times daily. 11/12/16  Yes Martinique, Betty G, MD  ipratropium (ATROVENT HFA) 17 MCG/ACT inhaler Inhale 2 puffs into the lungs every 4 (four) hours as needed for wheezing. 03/11/17  Yes Martinique, Betty G, MD  lidocaine-prilocaine (EMLA) cream Apply 1 application topically See admin instructions. Apply topically one hour before dialysis - Monday, Wednesday, Friday   Yes [provider]  loratadine (CLARITIN) 10 MG tablet Take 1 tablet (10 mg total) by mouth daily as needed for allergies. 08/09/16  Yes Martinique, Betty G, MD  losartan (COZAAR) 100 MG tablet Take 1 tablet (100 mg total) by mouth daily. 11/12/16  Yes Martinique, Betty G, MD  metoprolol succinate (TOPROL-XL) 100 MG 24 hr tablet TAKE 1 TABLET BY MOUTH TWICE DAILY Patient taking differently: TAKE 1 TABLET (100 MG)  BY MOUTH TWICE DAILY 03/27/17  Yes Martinique, Betty G, MD  multivitamin (RENA-VIT) TABS tablet Take 1  tablet by mouth at bedtime. Patient taking differently: Take 1 tablet by mouth daily.  01/01/15  Yes Haney, Alyssa A, MD  omeprazole (PRILOSEC) 40 MG capsule Take 1 capsule (40 mg total) by mouth 2 (two) times daily. 12/27/15  Yes Ladene Artist, MD  pregabalin (LYRICA) 100 MG capsule Take 100 mg by mouth at bedtime.    Yes [provider]  sevelamer carbonate (RENVELA) 800 MG tablet Take 2,400 mg  by mouth See admin instructions. Take 3 tablets (2400 mg) by mouth with each meal or snack - once or twice daily   Yes [provider]    Physical Exam: Vitals:   07/24/17 0030 07/24/17 0100 07/24/17 0130 07/24/17 0210  BP: (!) 148/58 (!) 162/52 (!) 167/55 (!) 154/49  Pulse: 89 75 89 83  Resp: 19 17 16 20   Temp:    (!) 97.5 F (36.4 C)  TempSrc:    Oral  SpO2: 96% 96% 94% 93%   General: in moderate acute distress HEENT:       Eyes: PERRL, EOMI, no scleral icterus.       ENT: No discharge from the ears and nose, no pharynx injection, no tonsillar enlargement.        Neck: No JVD, no bruit, no mass felt. Heme: No neck lymph node enlargement. Cardiac: S1/S2, RRR, No murmurs, No gallops or rubs. Respiratory: Has decreased air movement bilaterally, with mild wheezing Chest wall: has tenderness in the frontal chest wall on palpation GI: Soft, nondistended, nontender, no rebound pain, no organomegaly, BS present. GU: No hematuria Ext: No pitting leg edema bilaterally. 2+DP/PT pulse bilaterally. Musculoskeletal: No joint deformities, No joint redness or warmth, no limitation of ROM in spin. Skin: No rashes.  Neuro: Alert, oriented X3, cranial nerves II-XII grossly intact, moves all extremities normally.  Psych: Patient is not psychotic, no suicidal or hemocidal ideation.  Labs on Admission: I have personally reviewed following labs and imaging studies  CBC: Recent Labs  Lab 07/23/17 1928  WBC 10.3  HGB 10.6*  HCT 32.7*  MCV 91.1  PLT 756   Basic Metabolic  Panel: Recent Labs  Lab 07/23/17 1928  NA 135  K 3.5  CL 94*  CO2 25  GLUCOSE 94  BUN 16  CREATININE 7.04*  CALCIUM 8.2*   GFR: Estimated Creatinine Clearance: 6.4 mL/min (A) (by C-G formula based on SCr of 7.04 mg/dL (H)). Liver Function Tests: No results for input(s): AST, ALT, ALKPHOS, BILITOT, PROT, ALBUMIN in the last 168 hours. No results for input(s): LIPASE, AMYLASE in the last 168 hours. No results for input(s): AMMONIA in the last 168 hours. Coagulation Profile: No results for input(s): INR, PROTIME in the last 168 hours. Cardiac Enzymes: Recent Labs  Lab 07/24/17 0240  TROPONINI 0.06*   BNP (last 3 results) No results for input(s): PROBNP in the last 8760 hours. HbA1C: Recent Labs    07/24/17 0240  HGBA1C 5.5   CBG: No results for input(s): GLUCAP in the last 168 hours. Lipid Profile: Recent Labs    07/24/17 0240  CHOL 84  HDL 46  LDLCALC 24  TRIG 69  CHOLHDL 1.8   Thyroid Function Tests: No results for input(s): TSH, T4TOTAL, FREET4, T3FREE, THYROIDAB in the last 72 hours. Anemia Panel: No results for input(s): VITAMINB12, FOLATE, FERRITIN, TIBC, IRON, RETICCTPCT in the last 72 hours. Urine analysis:    Component Value Date/Time   COLORURINE YELLOW 12/30/2014 1419   APPEARANCEUR CLEAR 12/30/2014 1419   LABSPEC 1.009 12/30/2014 1419   PHURINE 7.5 12/30/2014 1419   GLUCOSEU NEGATIVE 12/30/2014 1419   HGBUR NEGATIVE 12/30/2014 1419   HGBUR negative 03/23/2007 1112   BILIRUBINUR NEGATIVE 12/30/2014 1419   KETONESUR NEGATIVE 12/30/2014 1419   PROTEINUR 100 (A) 12/30/2014 1419   UROBILINOGEN 0.2 12/30/2014 1419   NITRITE NEGATIVE 12/30/2014 1419   LEUKOCYTESUR NEGATIVE 12/30/2014 1419   Sepsis Labs: @LABRCNTIP (procalcitonin:4,lacticidven:4) )No results found for this or any previous visit (from  the past 240 hour(s)).   Radiological Exams on Admission: Dg Chest 2 View  Result Date: 07/23/2017 CLINICAL DATA:  Difficulty breathing. EXAM:  CHEST  2 VIEW COMPARISON:  12/22/2016. FINDINGS: Low volume film with stable asymmetric elevation right hemidiaphragm. Atelectasis noted in the lungs bilaterally, right greater than left. No pulmonary edema or pleural effusion. HeRo dialysis catheter evident. Telemetry leads overlie the chest. The visualized bony structures of the thorax are intact. IMPRESSION: Bibasilar atelectasis without pulmonary edema or pleural effusion. Electronically Signed   By: Misty Stanley M.D.   On: 07/23/2017 20:02   Ct Chest Wo Contrast  Result Date: 07/23/2017 CLINICAL DATA:  Chronic dyspnea with wheeze and right-sided chest pain beginning today. EXAM: CT CHEST WITHOUT CONTRAST TECHNIQUE: Multidetector CT imaging of the chest was performed following the standard protocol without IV contrast. COMPARISON:  Same day CXR FINDINGS: Cardiovascular: Cardiomegaly without pericardial effusion. Moderate aortic atherosclerosis without aneurysm. Mild dilatation of the main pulmonary artery compatible with a component of chronic pulmonary hypertension. Left main and three-vessel coronary arteriosclerosis. Large-bore left IJ catheter is seen with tip in the right atrium. Mediastinum/Nodes: 15 mm short axis right lower paratracheal lymph node likely reactive. Smaller right upper paratracheal and prevascular lymph nodes are present. Lungs/Pleura: Mild centrilobular emphysema. Bilateral peribronchial and peribronchiolar thickening consistent with chronic bronchitic change more so to both lower lobes and right middle lobe. Subpleural areas of atelectasis are noted in both lung bases. No effusion or pneumothorax. Upper Abdomen: Moderate renal cortical scarring of the visualized right upper pole of the kidney with small parapelvic and cortical cysts noted. There is colonic diverticulosis of the included large bowel without diverticulitis. Status post cholecystectomy. Hypodense nodule of the left adrenal apex may reflect a small benign adenoma  measuring 13 mm. Musculoskeletal: Thoracolumbar spondylosis. 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 IMPRESSION: 1. Bronchial and peribronchial thickening compatible with chronic bronchitic change. Bibasilar atelectasis is noted. 2. Cardiomegaly with coronary arteriosclerosis and aortic atherosclerosis. 3. Centrilobular emphysema, upper lobe predominant. 4. Left adrenal nodule compatible with adenoma measuring 13 mm. 5. Moderate right-sided renal cortical scarring with renal cysts. 6. Colonic diverticulosis without acute diverticulitis. Aortic Atherosclerosis (ICD10-I70.0) and Emphysema (ICD10-J43.9). Electronically Signed   By: Ashley Royalty M.D.   On: 07/23/2017 21:26     EKG: Independently reviewed.  Sinus rhythm, QTC 523, left bundle blockage, poor R-wave progression  Assessment/Plan Principal Problem:   Acute on chronic respiratory failure with hypoxia (HCC) Active Problems:   HYPERCHOLESTEROLEMIA   Gout, unspecified   TOBACCO ABUSE   Major depression in partial remission (HCC)   GASTROESOPHAGEAL REFLUX, NO ESOPHAGITIS   Hypertension   COPD exacerbation (HCC)   ESRD on dialysis ([place])   Chronic obstructive pulmonary disease (HCC)   Left bundle branch block   Chest pain   Acute on chronic respiratory failure with hypoxia: Possibly due to COPD exacerbation. Patient also has chest pain which may have partially contributed. Patient does not have leg edema. No pulmonary edema on chest x-ray, does not seem to have fluid overload currently. Patient has decreased air movement, productive cough and wheezing on auscultation, clinically consistent with COPD exacerbation.  -will admit patient to telemetry bed  -Nebulizers: scheduled Duoneb and prn albuterol -Solu-Medrol 60 mg IV bid -Z pak -Mucinex for cough  -Incentive spirometry -Urine drug screen, HIV -Urine S. pneumococcal antigen -Follow up blood culture x2, sputum culture, Flu pcr -Nasal cannula  oxygen as needed to maintain O2 saturation 92% or greater  Chest pain, new LBBB and elevated trop: pt has reproducible chest wall tenderness. She was found to have a new LBBB. Troponin is elevated 0.06. This is in the setting of ESRD.  Cardiology, Dr. Tusc[place]ia Lions was consulted-->"ACS is extremely unlikely in this case".  - cycle CE q6 x3 and repeat EKG in the am  - prn Nitroglycerin, Morphine, and aspirin, lipitor  - Risk factor stratification: will check FLP and A1C  - 2d echo  HLD: -lipitor  Gout: -continue home allopurinol   Tobacco abuse: -Did counseling about importance of quitting smoking -Nicotine patch  Depression: Stable, no suicidal or homicidal ideations. -Continue home medications: Cymbalta  GERD: -Protonix  ESRD (end stage renal disease) on dialysis (MWF):  -Left message to renal box for HD  ESRD (end stage renal disease) on dialysis (MWF): potassium 3.5, bicarbonate 25, creatinine 7.04, BUN 16 -Left message to renal box for HD -Continue Phoslo, Sensipar, Renvela and Calcitriol  HTN:  -Continue home medications: Metoprolol, Cozaar, clonidine, amlodipine -IV hydralazine prn   DVT ppx: SQ Heparin   Code Status: Full code Family Communication: None at bed side.   Disposition Plan:  Anticipate discharge back to previous home environment Consults called:  Card, Dr.  Lions Admission status:  Inpatient/tele     Date of Service 07/24/2017    Ivor Costa [facility] Pager [phone]  If 7PM-7AM, please contact night-coverage [email or web address] Password [facility] 07/24/2017, 4:54 AM

## 2017-07-24 NOTE — Consult Note (Signed)
Cardiology Consultation Note    Patient ID: [name], MRN: [id number], DOB/AGE: 1941-03-04 77 y.o. Admit date: 07/23/2017   Date of Consult: 07/24/2017 Primary Physician: Martinique, Betty G, MD  Reason for Consultation: New LBBB Requesting MD: Dr. Rex Kras  HPI: [name] is a 77 y.o. 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  Patient has had a history of cough with some mild sputum production for about 2-3 weeks.  Today, she noticed from the morning onward she was much more short of breath.  She denies any fevers or chills.  She did have some mild pleuritic chest discomfort when trying to take deep breaths.  Otherwise, she denies any persistent chest pain, PND, orthopnea, lower extremity edema, palpitations, or syncope.  She is due for her next dialysis session tomorrow.  Given her significant shortness of breath, she presented to [facility] ED for further evaluation.  In the ED, she was mildly hypoxemic and was put on 2-3 L nasal cannula with improvement of sats.  Her ECG showed a new left bundle branch block, however the patient had no chest pain during the course of her ED stay.  Her labs including initial troponin were with in normal limits.  She had a chest CT which showed changes consistent with COPD, but were otherwise unremarkable.  The patient felt that her shortness of breath was much improved with nebulizer treatments she received in the ED.  She also got IV steroids.  Upon my interview, she denied any chest discomfort whatsoever and felt that her breathing was improved.  Past Medical History:  Diagnosis Date  . Abnormality of gait 03/28/2015  . Adrenal mass ([place])   . 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 Arthritis   . Back pain   . CHF (congestive heart failure) ([place])   . Chronic kidney disease    Hemo MWF  . Congestion of throat    Pt states she  has a lot mucus in back throat.  . Constipation   . COPD (chronic obstructive pulmonary disease) ([place])   . Depression   . Dialysis patient [facility])    kidney  . Diverticulitis   . GERD (gastroesophageal reflux disease)   . H/O hiatal hernia   . High cholesterol   . Hoarseness of voice   . Hyperlipidemia   . Hypertension   . IBS (irritable bowel syndrome)   . Meralgia paresthetica of right side 12/26/2014  . Normal cardiac stress test 12/24/2009   lexiscan, imaging normal  . Renal disorder   . RLS (restless legs syndrome)   . Seizures ([place])    2004 past brain surgery  . Sinus complaint   . Thyroid disease   . TIA (transient ischemic attack)   . Tubular adenoma of colon 01/2008      Surgical History:  Past Surgical History:  Procedure Laterality Date  . ABDOMINAL HYSTERECTOMY    . ANAL RECTAL MANOMETRY N/A 07/25/2015   Procedure: ANO RECTAL MANOMETRY;  Surgeon: Mauri Pole, MD;  Location: [facility];  Service: Endoscopy;  Laterality: N/A;  . APPENDECTOMY    . AV FISTULA PLACEMENT Left 11/11/2012   Procedure: INSERTION OF ARTERIOVENOUS (AV) GORE-TEX GRAFT ARM;  Surgeon: Angelia Mould, MD;  Location: [place];  Service: Vascular;  Laterality: Left;  . [place] REMOVAL Left 11/18/2012   Procedure: REMOVAL  OF LEFT UPPER ARM ARTERIOVENOUS GORETEX GRAFT ([place]);  Surgeon: Angelia Mould, MD;  Location: [place];  Service: Vascular;  Laterality: Left;  . CARDIAC CATHETERIZATION  2003   normal  . CHOLECYSTECTOMY     Open mid line incision  . frontal craniotomy  2002   indication = sinusitis  . INSERTION OF DIALYSIS CATHETER     Left  . PATCH ANGIOPLASTY Left 11/18/2012   Procedure: PATCH ANGIOPLASTY;  Surgeon: Angelia Mould, MD;  Location: [place];  Service: Vascular;  Laterality: Left;  . PERIPHERAL VASCULAR CATHETERIZATION Left 03/08/2015   Procedure: A/V Shuntogram/Fistulagram;  Surgeon: Algernon Huxley, MD;  Location: [place] [facility];  Service: Cardiovascular;   Laterality: Left;  . PERIPHERAL VASCULAR CATHETERIZATION Left 03/08/2015   Procedure: A/V Shunt Intervention;  Surgeon: Algernon Huxley, MD;  Location: [place] [facility];  Service: Cardiovascular;  Laterality: Left;  . RECTAL ULTRASOUND N/A 10/05/2015   Procedure: ANAL ULTRASOUND WITH PROBE;  Surgeon: Leighton Ruff, MD;  Location: [facility];  Service: Endoscopy;  Laterality: N/A;  . REVISON OF ARTERIOVENOUS FISTULA  05/11/2012   Procedure: REVISON OF ARTERIOVENOUS FISTULA;  Surgeon: Elam Dutch, MD;  Location: [place];  Service: Vascular;  Laterality: Right;  . TUBAL LIGATION       Home Meds: Prior to Admission medications   Medication Sig Start Date End Date Taking? Authorizing Provider  acetaminophen (TYLENOL) 500 MG tablet Take 1,000 mg by mouth every 6 (six) hours as needed for mild pain or moderate pain.   Yes [provider]  albuterol (PROVENTIL HFA;VENTOLIN HFA) 108 (90 Base) MCG/ACT inhaler Inhale 2 puffs into the lungs every 4 (four) hours as needed for wheezing or shortness of breath. 03/11/17  Yes Martinique, Betty G, MD  allopurinol (ZYLOPRIM) 100 MG tablet Take 100 mg by mouth once daily on non-dialysis days (Tues/Thurs/Sat/Sun) Patient taking differently: Take 100 mg by mouth See admin instructions. Take one tablet (100 mg) by mouth once daily on non-dialysis days (Tues/Thurs/Sat/Sun) 11/13/15  Yes Smiley Houseman, MD  amLODipine (NORVASC) 10 MG tablet TAKE 1 TABLET BY MOUTH AT BEDTIME Patient taking differently: TAKE 1 TABLET (10 MG) BY MOUTH AT BEDTIME 06/01/17  Yes Martinique, Betty G, MD  aspirin EC 81 MG tablet Take 81 mg by mouth daily.    Yes [provider]  atorvastatin (LIPITOR) 40 MG tablet Take 1 tablet (40 mg total) by mouth daily. Patient taking differently: Take 40 mg by mouth at bedtime.  02/26/15  Yes Rumley, [place] N, DO  azelastine (ASTELIN) 0.1 % nasal spray Place 1 spray into both nostrils 2 (two) times daily. Use in each nostril as  directed Patient taking differently: Place 1 spray into both nostrils 2 (two) times daily as needed for rhinitis or allergies. Use in each nostril as directed 08/07/16  Yes Martinique, Betty G, MD  cinacalcet (SENSIPAR) 90 MG tablet Take 90 mg by mouth at bedtime.   Yes [provider]  cloNIDine (CATAPRES) 0.2 MG tablet Take 1 tablet (0.2 mg total) by mouth 2 (two) times daily. Patient taking differently: Take 0.3 mg by mouth 3 (three) times daily.  12/08/16  Yes Johnson, Clanford L, MD  dicyclomine (BENTYL) 10 MG capsule Take 1 capsule (10 mg total) by mouth 3 (three) times daily. 07/13/17  Yes Ladene Artist, MD  DULoxetine (CYMBALTA) 30 MG capsule Take 2 capsules (60 mg total) by mouth daily. Patient taking differently: Take 30 mg by mouth at bedtime.  01/08/17  Yes Kathrynn Ducking, MD  fluticasone [facility]) 50 MCG/ACT nasal spray Place 2 sprays into both nostrils daily as needed for allergies or rhinitis. 05/08/16  Yes Rogue Bussing, MD  Homeopathic Products (LEG CRAMP RELIEF PO) Take 1-2 tablets by mouth daily as needed (for leg pain).    Yes [provider]  hydrALAZINE (APRESOLINE) 10 MG tablet Take 1 tablet (10 mg total) by mouth 2 (two) times daily. 11/12/16  Yes Martinique, Betty G, MD  ipratropium (ATROVENT HFA) 17 MCG/ACT inhaler Inhale 2 puffs into the lungs every 4 (four) hours as needed for wheezing. 03/11/17  Yes Martinique, Betty G, MD  lidocaine-prilocaine (EMLA) cream Apply 1 application topically See admin instructions. Apply topically one hour before dialysis - Monday, Wednesday, Friday   Yes [provider]  loratadine (CLARITIN) 10 MG tablet Take 1 tablet (10 mg total) by mouth daily as needed for allergies. 08/09/16  Yes Martinique, Betty G, MD  losartan (COZAAR) 100 MG tablet Take 1 tablet (100 mg total) by mouth daily. 11/12/16  Yes Martinique, Betty G, MD  metoprolol succinate (TOPROL-XL) 100 MG 24 hr tablet TAKE 1 TABLET BY MOUTH TWICE DAILY Patient taking  differently: TAKE 1 TABLET (100 MG)  BY MOUTH TWICE DAILY 03/27/17  Yes Martinique, Betty G, MD  multivitamin (RENA-VIT) TABS tablet Take 1 tablet by mouth at bedtime. Patient taking differently: Take 1 tablet by mouth daily.  01/01/15  Yes Haney, Alyssa A, MD  omeprazole (PRILOSEC) 40 MG capsule Take 1 capsule (40 mg total) by mouth 2 (two) times daily. 12/27/15  Yes Ladene Artist, MD  pregabalin (LYRICA) 100 MG capsule Take 100 mg by mouth at bedtime.    Yes [provider]  sevelamer carbonate (RENVELA) 800 MG tablet Take 2,400 mg by mouth See admin instructions. Take 3 tablets (2400 mg) by mouth with each meal or snack - once or twice daily   Yes [provider]    Inpatient Medications:  . [START ON 07/25/2017] allopurinol  100 mg Oral Q T,Th,S,Su  . amLODipine  10 mg Oral QHS  . aspirin EC  81 mg Oral Daily  . atorvastatin  40 mg Oral QHS  . cinacalcet  90 mg Oral QHS  . cloNIDine  0.3 mg Oral TID  . dicyclomine  10 mg Oral TID  . DULoxetine  60 mg Oral Daily  . heparin  5,000 Units Subcutaneous Q8H  . hydrALAZINE  10 mg Oral BID  . ipratropium-albuterol  3 mL Nebulization Q4H  . lidocaine-prilocaine  1 application Topical Q M,W,F-HD  . losartan  100 mg Oral Daily  . methylPREDNISolone (SOLU-MEDROL) injection  60 mg Intravenous Q12H  . metoprolol succinate  100 mg Oral BID  . multivitamin  1 tablet Oral QHS  . nicotine  21 mg Transdermal Daily  . pantoprazole  40 mg Oral Daily  . pregabalin  100 mg Oral QHS  . sevelamer carbonate  2,400 mg Oral See admin instructions     Allergies:  Allergies  Allergen Reactions  . Tuberculin Tests Hives    "blisters"  . Valacyclovir Other (See Comments)    Confusion and nervousness  . Codeine Nausea And Vomiting  . Penicillins Rash    No problems breathing. Has tolerated omnicef in past without issue Has patient had a PCN reaction causing immediate rash, facial/tongue/throat swelling, SOB or lightheadedness with  hypotension: Yes Has patient had a PCN reaction causing severe rash involving mucus membranes or skin necrosis:  No Has patient had a PCN reaction that required hospitalization No Has patient had a PCN reaction occurring within the last 10 years: No If all of the above answers are "NO", then may proceed with Cephalosporin  . Sulfamethoxazole Rash    Social History   Socioeconomic History  . Marital status: Single    Spouse name: Not on file  . Number of children: 3  . Years of education: 85  . Highest education level: Not on file  Social Needs  . Financial resource strain: Not on file  . Food insecurity - worry: Not on file  . Food insecurity - inability: Not on file  . Transportation needs - medical: Not on file  . Transportation needs - non-medical: Not on file  Occupational History  . Occupation: retired  Tobacco Use  . Smoking status: Current Every Day Smoker    Packs/day: 1.00    Years: 60.00    Pack years: 60.00    Types: Cigarettes  . Smokeless tobacco: Never Used  . Tobacco comment: smoker since 77 yo.  1.5 ppd of [place] 100 lights. ; form given 10-23-16  Substance and Sexual Activity  . Alcohol use: No    Alcohol/week: 0.0 oz  . Drug use: No    Comment: No hx of IV drug use  . Sexual activity: No    Birth control/protection: None  Other Topics Concern  . Not on file  Social History Narrative   ** Merged History Encounter **       Lives in [place] with Daughter, granddaughters (twins) and mentally challenged niece.   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   Patient has a college education.   Patient drinks one cup of caffeine daily.              Family History  Problem Relation Age of Onset  . Thyroid cancer Mother   . Heart disease Father   . Hypertension Father   . Heart attack Father   . Lupus Daughter   . Breast cancer Sister   . Thyroid cancer Sister   . Kidney disease Sister   . Colon cancer Neg Hx      Labs: No results for  input(s): CKTOTAL, CKMB, TROPONINI in the last 72 hours. Lab Results  Component Value Date   WBC 10.3 07/23/2017   HGB 10.6 (L) 07/23/2017   HCT 32.7 (L) 07/23/2017   MCV 91.1 07/23/2017   PLT 223 07/23/2017    Recent Labs  Lab 07/23/17 1928  NA 135  K 3.5  CL 94*  CO2 25  BUN 16  CREATININE 7.04*  CALCIUM 8.2*  GLUCOSE 94   Lab Results  Component Value Date   CHOL 154 05/01/2010   HDL 36 (L) 05/01/2010   LDLCALC 96 05/01/2010   TRIG 109 05/01/2010   No results found for: DDIMER  Radiology/Studies:  Dg Chest 2 View  Result Date: 07/23/2017 CLINICAL DATA:  Difficulty breathing. EXAM: CHEST  2 VIEW COMPARISON:  12/22/2016. FINDINGS: Low volume film with stable asymmetric elevation right hemidiaphragm. Atelectasis noted in the lungs bilaterally, right greater than left. No pulmonary edema or pleural effusion. HeRo dialysis catheter evident. Telemetry leads overlie the chest. The visualized bony structures of the thorax are intact. IMPRESSION: Bibasilar atelectasis without pulmonary edema or pleural effusion. Electronically Signed   By: Misty Stanley M.D.   On: 07/23/2017 20:02   Ct Chest Wo Contrast  Result Date: 07/23/2017 CLINICAL DATA:  Chronic dyspnea with  wheeze and right-sided chest pain beginning today. EXAM: CT CHEST WITHOUT CONTRAST TECHNIQUE: Multidetector CT imaging of the chest was performed following the standard protocol without IV contrast. COMPARISON:  Same day CXR FINDINGS: Cardiovascular: Cardiomegaly without pericardial effusion. Moderate aortic atherosclerosis without aneurysm. Mild dilatation of the main pulmonary artery compatible with a component of chronic pulmonary hypertension. Left main and three-vessel coronary arteriosclerosis. Large-bore left IJ catheter is seen with tip in the right atrium. Mediastinum/Nodes: 15 mm short axis right lower paratracheal lymph node likely reactive. Smaller right upper paratracheal and prevascular lymph nodes are present.  Lungs/Pleura: Mild centrilobular emphysema. Bilateral peribronchial and peribronchiolar thickening consistent with chronic bronchitic change more so to both lower lobes and right middle lobe. Subpleural areas of atelectasis are noted in both lung bases. No effusion or pneumothorax. Upper Abdomen: Moderate renal cortical scarring of the visualized right upper pole of the kidney with small parapelvic and cortical cysts noted. There is colonic diverticulosis of the included large bowel without diverticulitis. Status post cholecystectomy. Hypodense nodule of the left adrenal apex may reflect a small benign adenoma measuring 13 mm. Musculoskeletal: Thoracolumbar spondylosis. 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 IMPRESSION: 1. Bronchial and peribronchial thickening compatible with chronic bronchitic change. Bibasilar atelectasis is noted. 2. Cardiomegaly with coronary arteriosclerosis and aortic atherosclerosis. 3. Centrilobular emphysema, upper lobe predominant. 4. Left adrenal nodule compatible with adenoma measuring 13 mm. 5. Moderate right-sided renal cortical scarring with renal cysts. 6. Colonic diverticulosis without acute diverticulitis. Aortic Atherosclerosis (ICD10-I70.0) and Emphysema (ICD10-J43.9). Electronically Signed   By: Ashley Royalty M.D.   On: 07/23/2017 21:26   Ct Abdomen Pelvis W Contrast  Result Date: 07/21/2017 CLINICAL DATA:  Left lower quadrant abdominal pain with diarrhea for 2 weeks. Bilateral leg weakness. History of diverticulitis and irritable bowel syndrome. End-stage renal disease on hemodialysis. EXAM: CT ABDOMEN AND PELVIS WITH CONTRAST TECHNIQUE: Multidetector CT imaging of the abdomen and pelvis was performed using the standard protocol following bolus administration of intravenous contrast. CONTRAST:  138mL ISOVUE-300 IOPAMIDOL (ISOVUE-300) INJECTION 61% COMPARISON:  CT 12/30/2014.  Renal ultrasound 12/31/2014. FINDINGS: Lower chest: Interval  improved aeration of both lung bases. There is mild residual dependent atelectasis or scarring in the right lower lobe. No significant pleural or pericardial effusion. Hemodialysis catheter is present inferiorly in the right atrium. There is atherosclerosis of the aorta and coronary arteries. Hepatobiliary: The liver is normal in density without focal abnormality. Stable mild extrahepatic biliary dilatation status post cholecystectomy. The common hepatic duct measures approximately 9 mm in diameter. Pancreas: Unremarkable. No pancreatic ductal dilatation or surrounding inflammatory changes. Spleen: Normal in size without focal abnormality. Adrenals/Urinary Tract: Stable left adrenal nodule measuring 19 x 16 mm on image 26, likely an adenoma based on stability. The right adrenal gland appears normal. Both kidneys demonstrate cortical thinning, limited parenchymal enhancement and no significant excretion of contrast. Hyperdense lesion centrally in the left kidney has enlarged compared with the prior study, now measuring up to 2.9 x 3.3 cm on image 33 (previously 2.5 cm maximally). This measures 59 HU. There are additional low-density renal lesions bilaterally which are likely simple cysts. No evidence of urinary tract calculus or hydronephrosis. The bladder is nearly empty without apparent focal abnormality. Stomach/Bowel: The stomach and small bowel appear normal. There are extensive diverticular changes throughout the colon, especially distally. There is mild sigmoid colon wall thickening, but no surrounding inflammation or significant bowel distension. Vascular/Lymphatic: There are no enlarged abdominal or pelvic lymph nodes. There is  diffuse aortic and branch vessel atherosclerosis. No evidence of large vessel occlusion or significant venous abnormality. Reproductive: Hysterectomy. No evidence of adnexal mass. Probable pelvic floor laxity. Other: Upper anterior abdominal hernia containing only fat has slightly  enlarged from the prior study and is probably incisional. Musculoskeletal: No acute or significant osseous findings. Multilevel degenerative changes throughout the lumbar spine. Bilateral sacroiliac degenerative changes. IMPRESSION: 1. Extensive colonic diverticulosis, greatest distally where there is some wall thickening in the sigmoid colon. No surrounding inflammation to suggest active diverticulitis. No evidence of bowel perforation or obstruction. 2. Indeterminate lesion centrally in the left kidney has enlarged from previous noncontrast study of 2016. Although incompletely characterized by this study, this was felt to be a cyst on prior ultrasound. Consider dedicated renal CT (without and with contrast) to exclude subtle enhancement. 3. Extensive Aortic Atherosclerosis (ICD10-I70.0). 4. Ventral hernia containing only fat. Electronically Signed   By: Richardean Sale M.D.   On: 07/21/2017 17:06    Wt Readings from Last 3 Encounters:  07/13/17 74.6 kg (164 lb 6 oz)  06/16/17 76.1 kg (167 lb 12.8 oz)  05/19/17 76.3 kg (168 lb 3.2 oz)    EKG: Sinus rhythm, left bundle branch block with associated repolarization abnormalities.  Physical Exam: Blood pressure (!) 139/57, pulse 95, resp. rate (!) 25, SpO2 99 %. There is no height or weight on file to calculate BMI. General: Well developed, well nourished, in no acute distress. Head: Normocephalic, atraumatic, sclera non-icteric, no xanthomas, nares are without discharge.  Neck: Negative for carotid bruits. JVD not elevated. Lungs: Mildly tachypneic, scattered wheezes throughout. Heart: RRR with S1 S2. No murmurs, rubs, or gallops appreciated. Abdomen: Soft, non-tender, non-distended with normoactive bowel sounds. No hepatomegaly. No rebound/guarding. No obvious abdominal masses. Msk:  Strength and tone appear normal for age. Extremities: No clubbing or cyanosis. No edema.  Distal pedal pulses are 2+ and equal bilaterally. Neuro: Alert and oriented  X 3. No facial asymmetry. No focal deficit. Moves all extremities spontaneously. Psych:  Responds to questions appropriately with a normal affect.     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  Cardiology is now consulted for new left bundle branch block.  Reassuringly, she has had symptoms of shortness of breath continuously since early this morning, and despite her impaired renal clearance, her initial troponin was completely normal.  In combination with her atypical symptoms which seem more consistent with COPD flare, ACS is extremely unlikely in this case.  Would plan to check an echo in the morning, and it would be reasonable to re-cycle 1 more set of cardiac biomarkers.  Continue aggressive CAD prevention measures.  Signed, Doylene Canning, MD 07/24/2017, 1:18 AM

## 2017-07-24 NOTE — Progress Notes (Signed)
MD notified of patient needing to be on a renal carb mod diet.

## 2017-07-24 NOTE — Progress Notes (Addendum)
77 yo AAF with ESRD (chronic HD MWF [facility]) , HTN, COPD (tob. Abuse ongoing ),CAD,TIA, GERD, Hyperlipidemia , Diverticulosis, Adrenal Mass, Depression, Anxiety  Admitted with Copd exacerbation  Hypoxia SOB, Chest pain  With  Current CXR indicative of volume overload ,had new Left Bundle Branch Block .She is compliance with HD  First CE neg and Cardiology is evaluating . We will provide HD today .   Pt in Observation  =We will order and supervise dialysison schedule. Please advise if patient status upgraded to in-patient and we will consult formally. HD today.may need lower edw as op     OP HD + MWF [facility]  3 hr 45 min  EDW = 74.5 kg 2k, 2.5 ca bath                   LUA  Hero graft  Uf profile 2                   Hec. 2 mcg q hd, Mircera 30 mcg q 4 wks (last given2/20/19)                  Heparin 4000                  OP HD Meds=  Sensipar 90 mg q day, Renvela 800 mg 3 Ac                                           And 2 snacks   Ernest Haber, PA-C [facility] Beeper [phone] 07/24/2017,11:07 AM  LOS: 0 days   Pt seen, examined and agree w A/P as above.  Kelly Splinter MD [employer] pager [phone]   07/25/2017, 8:11 AM    Labs: Basic Metabolic Panel: Recent Labs  Lab 07/23/17 1928  NA 135  K 3.5  CL 94*  CO2 25  GLUCOSE 94  BUN 16  CREATININE 7.04*  CALCIUM 8.2*   Liver Function Tests: No results for input(s): AST, ALT, ALKPHOS, BILITOT, PROT, ALBUMIN in the last 168 hours. No results for input(s): LIPASE, AMYLASE in the last 168 hours. No results for input(s): AMMONIA in the last 168 hours. CBC: Recent Labs  Lab 07/23/17 1928  WBC 10.3  HGB 10.6*  HCT 32.7*  MCV 91.1  PLT 223   Cardiac Enzymes: Recent Labs  Lab 07/24/17 0240 07/24/17 0750  TROPONINI 0.06* 0.11*   CBG: No results for input(s): GLUCAP in the last 168 hours.  Studies/Results: Dg Chest 2 View  Result Date: 07/23/2017 CLINICAL DATA:  Difficulty breathing. EXAM:  CHEST  2 VIEW COMPARISON:  12/22/2016. FINDINGS: Low volume film with stable asymmetric elevation right hemidiaphragm. Atelectasis noted in the lungs bilaterally, right greater than left. No pulmonary edema or pleural effusion. HeRo dialysis catheter evident. Telemetry leads overlie the chest. The visualized bony structures of the thorax are intact. IMPRESSION: Bibasilar atelectasis without pulmonary edema or pleural effusion. Electronically Signed   By: Misty Stanley M.D.   On: 07/23/2017 20:02   Ct Chest Wo Contrast  Result Date: 07/23/2017 CLINICAL DATA:  Chronic dyspnea with wheeze and right-sided chest pain beginning today. EXAM: CT CHEST WITHOUT CONTRAST TECHNIQUE: Multidetector CT imaging of the chest was performed following the standard protocol without IV contrast. COMPARISON:  Same day CXR FINDINGS: Cardiovascular: Cardiomegaly without pericardial effusion. Moderate aortic atherosclerosis without aneurysm. Mild dilatation  of the main pulmonary artery compatible with a component of chronic pulmonary hypertension. Left main and three-vessel coronary arteriosclerosis. Large-bore left IJ catheter is seen with tip in the right atrium. Mediastinum/Nodes: 15 mm short axis right lower paratracheal lymph node likely reactive. Smaller right upper paratracheal and prevascular lymph nodes are present. Lungs/Pleura: Mild centrilobular emphysema. Bilateral peribronchial and peribronchiolar thickening consistent with chronic bronchitic change more so to both lower lobes and right middle lobe. Subpleural areas of atelectasis are noted in both lung bases. No effusion or pneumothorax. Upper Abdomen: Moderate renal cortical scarring of the visualized right upper pole of the kidney with small parapelvic and cortical cysts noted. There is colonic diverticulosis of the included large bowel without diverticulitis. Status post cholecystectomy. Hypodense nodule of the left adrenal apex may reflect a small benign adenoma  measuring 13 mm. Musculoskeletal: Thoracolumbar spondylosis. 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 IMPRESSION: 1. Bronchial and peribronchial thickening compatible with chronic bronchitic change. Bibasilar atelectasis is noted. 2. Cardiomegaly with coronary arteriosclerosis and aortic atherosclerosis. 3. Centrilobular emphysema, upper lobe predominant. 4. Left adrenal nodule compatible with adenoma measuring 13 mm. 5. Moderate right-sided renal cortical scarring with renal cysts. 6. Colonic diverticulosis without acute diverticulitis. Aortic Atherosclerosis (ICD10-I70.0) and Emphysema (ICD10-J43.9). Electronically Signed   By: Ashley Royalty M.D.   On: 07/23/2017 21:26   Medications:  . [START ON 07/25/2017] allopurinol  100 mg Oral Q T,Th,S,Su  . amLODipine  10 mg Oral QHS  . aspirin EC  81 mg Oral Daily  . atorvastatin  40 mg Oral QHS  . [START ON 07/25/2017] azithromycin  250 mg Oral Daily  . cinacalcet  90 mg Oral Q supper  . cloNIDine  0.3 mg Oral TID  . dicyclomine  10 mg Oral TID  . doxercalciferol  2 mcg Intravenous Q M,W,F-HD  . DULoxetine  60 mg Oral Daily  . heparin  5,000 Units Subcutaneous Q8H  . hydrALAZINE  10 mg Oral BID  . ipratropium-albuterol  3 mL Nebulization Q4H  . lidocaine-prilocaine  1 application Topical Q M,W,F-HD  . losartan  100 mg Oral Daily  . methylPREDNISolone (SOLU-MEDROL) injection  60 mg Intravenous Q12H  . metoprolol succinate  100 mg Oral BID  . multivitamin  1 tablet Oral QHS  . nicotine  21 mg Transdermal Daily  . pantoprazole  40 mg Oral Daily  . pregabalin  100 mg Oral QHS  . sevelamer carbonate  2,400 mg Oral TID WC

## 2017-07-24 NOTE — Progress Notes (Signed)
  Echocardiogram 2D Echocardiogram has been performed.  Exavior Kimmons T Emersen Mascari 07/24/2017, 2:34 PM

## 2017-07-24 NOTE — Progress Notes (Signed)
MD notified of patient vomiting due to acid reflux and requesting medicine to stop the nausea and vomiting.

## 2017-07-24 NOTE — Consult Note (Addendum)
[facility] Renal Consultation Note  Indication for Consultation:  Management of ESRD/hemodialysis; anemia, hypertension/volume and secondary hyperparathyroidism  HPI: [name] is a 77 y.o. female with ESRD (chronic HD MWF [place]) , HTN, COPD (tob. Abuse ongoing ),CAD,TIA, GERD, Hyperlipidemia , Diverticulosis, Adrenal Mass, Depression, Anxiety  Admitted with Copd exacerbation  Hypoxia SOB, Chest pain  With  Current CXR review with Dr Beadle hertz  indicative of volume overload ,has new Left Bundle Branch Block .She is compliance with HD  First CE neg and Cardiology is evaluating . Also reporting decreased appetite/ fatigue / dry cough with some wheezing . We will provide HD today Needs lower edw  At discharge as leaving at or slightly below edw as op .    Past Medical History:  Diagnosis Date  . Abnormality of gait 03/28/2015  . Adrenal mass ([place])   . 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 Arthritis   . Back pain   . CHF (congestive heart failure) ([place])   . Chronic kidney disease    Hemo MWF  . Congestion of throat    Pt states she has a lot mucus in back throat.  . Constipation   . COPD (chronic obstructive pulmonary disease) ([place])   . Depression   . Dialysis patient [facility])    kidney  . Diverticulitis   . GERD (gastroesophageal reflux disease)   . H/O hiatal hernia   . High cholesterol   . Hoarseness of voice   . Hyperlipidemia   . Hypertension   . IBS (irritable bowel syndrome)   . Meralgia paresthetica of right side 12/26/2014  . Normal cardiac stress test 12/24/2009   lexiscan, imaging normal  . Renal disorder   . RLS (restless legs syndrome)   . Seizures ([place])    2004 past brain surgery  . Sinus complaint   . Thyroid disease   . TIA (transient ischemic attack)   . Tubular adenoma of colon 01/2008    Past Surgical History:  Procedure Laterality Date  . ABDOMINAL HYSTERECTOMY    . ANAL RECTAL MANOMETRY N/A 07/25/2015    Procedure: ANO RECTAL MANOMETRY;  Surgeon: Mauri Pole, MD;  Location: [facility];  Service: Endoscopy;  Laterality: N/A;  . APPENDECTOMY    . AV FISTULA PLACEMENT Left 11/11/2012   Procedure: INSERTION OF ARTERIOVENOUS (AV) GORE-TEX GRAFT ARM;  Surgeon: Angelia Mould, MD;  Location: [facility];  Service: Vascular;  Laterality: Left;  . [place] REMOVAL Left 11/18/2012   Procedure: REMOVAL OF LEFT UPPER ARM ARTERIOVENOUS GORETEX GRAFT ([place]);  Surgeon: Angelia Mould, MD;  Location: [facility];  Service: Vascular;  Laterality: Left;  . CARDIAC CATHETERIZATION  2003   normal  . CHOLECYSTECTOMY     Open mid line incision  . frontal craniotomy  2002   indication = sinusitis  . INSERTION OF DIALYSIS CATHETER     Left  . PATCH ANGIOPLASTY Left 11/18/2012   Procedure: PATCH ANGIOPLASTY;  Surgeon: Angelia Mould, MD;  Location: [facility];  Service: Vascular;  Laterality: Left;  . PERIPHERAL VASCULAR CATHETERIZATION Left 03/08/2015   Procedure: A/V Shuntogram/Fistulagram;  Surgeon: Algernon Huxley, MD;  Location: [facility];  Service: Cardiovascular;  Laterality: Left;  . PERIPHERAL VASCULAR CATHETERIZATION Left 03/08/2015   Procedure: A/V Shunt Intervention;  Surgeon: Algernon Huxley, MD;  Location: [facility];  Service: Cardiovascular;  Laterality: Left;  . RECTAL ULTRASOUND N/A  10/05/2015   Procedure: ANAL ULTRASOUND WITH PROBE;  Surgeon: Leighton Ruff, MD;  [facility];  Service: Endoscopy;  Laterality: N/A;  . REVISON OF ARTERIOVENOUS FISTULA  05/11/2012   Procedure: REVISON OF ARTERIOVENOUS FISTULA;  Surgeon: Elam Dutch, MD;  Location: [facility];  Service: Vascular;  Laterality: Right;  . TUBAL LIGATION        Family History  Problem Relation Age of Onset  . Thyroid cancer Mother   . Heart disease Father   . Hypertension Father   . Heart attack Father   . Lupus Daughter   . Breast cancer Sister   . Thyroid cancer Sister   . Kidney disease Sister    . Colon cancer Neg Hx       reports that she has been smoking cigarettes.  She has a 60.00 pack-year smoking history. she has never used smokeless tobacco. She reports that she does not drink alcohol or use drugs.   Allergies  Allergen Reactions  . Tuberculin Tests Hives    "blisters"  . Valacyclovir Other (See Comments)    Confusion and nervousness  . Codeine Nausea And Vomiting  . Penicillins Rash    No problems breathing. Has tolerated omnicef in past without issue Has patient had a PCN reaction causing immediate rash, facial/tongue/throat swelling, SOB or lightheadedness with hypotension: Yes Has patient had a PCN reaction causing severe rash involving mucus membranes or skin necrosis: No Has patient had a PCN reaction that required hospitalization No Has patient had a PCN reaction occurring within the last 10 years: No If all of the above answers are "NO", then may proceed with Cephalosporin  . Sulfamethoxazole Rash    Prior to Admission medications   Medication Sig Start Date End Date Taking? Authorizing Provider  acetaminophen (TYLENOL) 500 MG tablet Take 1,000 mg by mouth every 6 (six) hours as needed for mild pain or moderate pain.   Yes [provider]  albuterol (PROVENTIL HFA;VENTOLIN HFA) 108 (90 Base) MCG/ACT inhaler Inhale 2 puffs into the lungs every 4 (four) hours as needed for wheezing or shortness of breath. 03/11/17  Yes Martinique, Betty G, MD  allopurinol (ZYLOPRIM) 100 MG tablet Take 100 mg by mouth once daily on non-dialysis days (Tues/Thurs/Sat/Sun) Patient taking differently: Take 100 mg by mouth See admin instructions. Take one tablet (100 mg) by mouth once daily on non-dialysis days (Tues/Thurs/Sat/Sun) 11/13/15  Yes Smiley Houseman, MD  amLODipine (NORVASC) 10 MG tablet TAKE 1 TABLET BY MOUTH AT BEDTIME Patient taking differently: TAKE 1 TABLET (10 MG) BY MOUTH AT BEDTIME 06/01/17  Yes Martinique, Betty G, MD  aspirin EC 81 MG tablet Take 81 mg by  mouth daily.    Yes [provider]  atorvastatin (LIPITOR) 40 MG tablet Take 1 tablet (40 mg total) by mouth daily. Patient taking differently: Take 40 mg by mouth at bedtime.  02/26/15  Yes Rumley, Cuthbert N, DO  azelastine (ASTELIN) 0.1 % nasal spray Place 1 spray into both nostrils 2 (two) times daily. Use in each nostril as directed Patient taking differently: Place 1 spray into both nostrils 2 (two) times daily as needed for rhinitis or allergies. Use in each nostril as directed 08/07/16  Yes Martinique, Betty G, MD  cinacalcet (SENSIPAR) 90 MG tablet Take 90 mg by mouth at bedtime.   Yes [provider]  cloNIDine (CATAPRES) 0.2 MG tablet Take 1 tablet (0.2 mg total) by mouth 2 (two) times daily. Patient taking differently: Take 0.3 mg  by mouth 3 (three) times daily.  12/08/16  Yes Johnson, Clanford L, MD  dicyclomine (BENTYL) 10 MG capsule Take 1 capsule (10 mg total) by mouth 3 (three) times daily. 07/13/17  Yes Ladene Artist, MD  DULoxetine (CYMBALTA) 30 MG capsule Take 2 capsules (60 mg total) by mouth daily. Patient taking differently: Take 30 mg by mouth at bedtime.  01/08/17  Yes Kathrynn Ducking, MD  fluticasone [facility]) 50 MCG/ACT nasal spray Place 2 sprays into both nostrils daily as needed for allergies or rhinitis. 05/08/16  Yes Rogue Bussing, MD  Homeopathic Products (LEG CRAMP RELIEF PO) Take 1-2 tablets by mouth daily as needed (for leg pain).    Yes [provider]  hydrALAZINE (APRESOLINE) 10 MG tablet Take 1 tablet (10 mg total) by mouth 2 (two) times daily. 11/12/16  Yes Martinique, Betty G, MD  ipratropium (ATROVENT HFA) 17 MCG/ACT inhaler Inhale 2 puffs into the lungs every 4 (four) hours as needed for wheezing. 03/11/17  Yes Martinique, Betty G, MD  lidocaine-prilocaine (EMLA) cream Apply 1 application topically See admin instructions. Apply topically one hour before dialysis - Monday, Wednesday, Friday   Yes [provider]  loratadine  (CLARITIN) 10 MG tablet Take 1 tablet (10 mg total) by mouth daily as needed for allergies. 08/09/16  Yes Martinique, Betty G, MD  losartan (COZAAR) 100 MG tablet Take 1 tablet (100 mg total) by mouth daily. 11/12/16  Yes Martinique, Betty G, MD  metoprolol succinate (TOPROL-XL) 100 MG 24 hr tablet TAKE 1 TABLET BY MOUTH TWICE DAILY Patient taking differently: TAKE 1 TABLET (100 MG)  BY MOUTH TWICE DAILY 03/27/17  Yes Martinique, Betty G, MD  multivitamin (RENA-VIT) TABS tablet Take 1 tablet by mouth at bedtime. Patient taking differently: Take 1 tablet by mouth daily.  01/01/15  Yes Haney, Alyssa A, MD  omeprazole (PRILOSEC) 40 MG capsule Take 1 capsule (40 mg total) by mouth 2 (two) times daily. 12/27/15  Yes Ladene Artist, MD  pregabalin (LYRICA) 100 MG capsule Take 100 mg by mouth at bedtime.    Yes [provider]  sevelamer carbonate (RENVELA) 800 MG tablet Take 2,400 mg by mouth See admin instructions. Take 3 tablets (2400 mg) by mouth with each meal or snack - once or twice daily   Yes [provider]     Anti-infectives (From admission, onward)   Start     Dose/Rate Route Frequency Ordered Stop   07/25/17 1000  azithromycin (ZITHROMAX) tablet 250 mg     250 mg Oral Daily 07/24/17 0507 07/29/17 0959   07/24/17 0515  azithromycin (ZITHROMAX) tablet 500 mg     500 mg Oral Daily 07/24/17 0507 07/24/17 0610      Results for orders placed or performed during the hospital encounter of 07/23/17 (from the past 48 hour(s))  Basic metabolic panel     Status: Abnormal   Collection Time: 07/23/17  7:28 PM  Result Value Ref Range   Sodium 135 135 - 145 mmol/L   Potassium 3.5 3.5 - 5.1 mmol/L   Chloride 94 (L) 101 - 111 mmol/L   CO2 25 22 - 32 mmol/L   Glucose, Bld 94 65 - 99 mg/dL   BUN 16 6 - 20 mg/dL   Creatinine, Ser 7.04 (H) 0.44 - 1.00 mg/dL   Calcium 8.2 (L) 8.9 - 10.3 mg/dL   GFR calc non Af Amer 5 (L) >60 mL/min   GFR calc Af Amer 6 (L) >60 mL/min  Comment: (NOTE) The eGFR  has been calculated using the CKD EPI equation. This calculation has not been validated in all clinical situations. eGFR's persistently <60 mL/min signify possible Chronic Kidney Disease.    Anion gap 16 (H) 5 - 15    Comment: Performed at [facility], [place] [address] [place], [place] [address]  CBC     Status: Abnormal   Collection Time: 07/23/17  7:28 PM  Result Value Ref Range   WBC 10.3 4.0 - 10.5 K/uL   RBC 3.59 (L) 3.87 - 5.11 MIL/uL   Hemoglobin 10.6 (L) 12.0 - 15.0 g/dL   HCT 32.7 (L) 36.0 - 46.0 %   MCV 91.1 78.0 - 100.0 fL   MCH 29.5 26.0 - 34.0 pg   MCHC 32.4 30.0 - 36.0 g/dL   RDW 16.6 (H) 11.5 - 15.5 %   Platelets 223 150 - 400 K/uL    Comment: Performed at [facility] [address], [place], [place] [address]  Brain natriuretic peptide     Status: Abnormal   Collection Time: 07/23/17  7:28 PM  Result Value Ref Range   B Natriuretic Peptide 670.0 (H) 0.0 - 100.0 pg/mL    Comment: Performed at [facility] [address], [place], [place] [address]  I-stat troponin, ED     Status: None   Collection Time: 07/23/17  7:58 PM  Result Value Ref Range   Troponin i, poc 0.02 0.00 - 0.08 ng/mL   Comment 3            Comment: Due to the release kinetics of cTnI, a negative result within the first hours of the onset of symptoms does not rule out myocardial infarction with certainty. If myocardial infarction is still suspected, repeat the test at appropriate intervals.   Hemoglobin A1c     Status: None   Collection Time: 07/24/17  2:40 AM  Result Value Ref Range   Hgb A1c MFr Bld 5.5 4.8 - 5.6 %    Comment: (NOTE) Pre diabetes:          5.7%-6.4% Diabetes:              >6.4% Glycemic control for   <7.0% adults with diabetes    Mean Plasma Glucose 111.15 mg/dL    Comment: Performed at [facility] [address], [place], [place] [address]  Lipid panel     Status: None   Collection Time: 07/24/17  2:40 AM  Result Value Ref Range    Cholesterol 84 0 - 200 mg/dL   Triglycerides 69 <150 mg/dL   HDL 46 >40 mg/dL   Total CHOL/HDL Ratio 1.8 RATIO   VLDL 14 0 - 40 mg/dL   LDL Cholesterol 24 0 - 99 mg/dL    Comment:        Total Cholesterol/HDL:CHD Risk Coronary Heart Disease Risk Table                     Men   Women  1/2 Average Risk   3.4   3.3  Average Risk       5.0   4.4  2 X Average Risk   9.6   7.1  3 X Average Risk  23.4   11.0        Use the calculated Patient Ratio above and the CHD Risk Table to determine the patient's CHD Risk.        ATP III CLASSIFICATION (  LDL):  <100     mg/dL   Optimal  100-129  mg/dL   Near or Above                    Optimal  130-159  mg/dL   Borderline  160-189  mg/dL   High  >190     mg/dL   Very High Performed at [facility] [address], [place], [place] [address]   Troponin I (q 6hr x 3)     Status: Abnormal   Collection Time: 07/24/17  2:40 AM  Result Value Ref Range   Troponin I 0.06 (HH) <0.03 ng/mL    Comment: CRITICAL RESULT CALLED TO, READ BACK BY AND VERIFIED WITH: OLOFIN,T RN 07/24/2017 0411 JORDANS Performed at [facility], [place] [address] [place], [place] [address]   Influenza panel by PCR (type A & B)     Status: None   Collection Time: 07/24/17  4:10 AM  Result Value Ref Range   Influenza A By PCR NEGATIVE NEGATIVE   Influenza B By PCR NEGATIVE NEGATIVE    Comment: (NOTE) The Xpert Xpress Flu assay is intended as an aid in the diagnosis of  influenza and should not be used as a sole basis for treatment.  This  assay is FDA approved for nasopharyngeal swab specimens only. Nasal  washings and aspirates are unacceptable for Xpert Xpress Flu testing. Performed at [facility], [place] [address] [place], [place] [address]   Troponin I (q 6hr x 3)     Status: Abnormal   Collection Time: 07/24/17  7:50 AM  Result Value Ref Range   Troponin I 0.11 (HH) <0.03 ng/mL    Comment: CRITICAL VALUE NOTED.  VALUE IS CONSISTENT WITH PREVIOUSLY  REPORTED AND CALLED VALUE. Performed at [facility], [place] [address] [place], [place] [address]   Troponin I (q 6hr x 3)     Status: Abnormal   Collection Time: 07/24/17  1:10 PM  Result Value Ref Range   Troponin I 0.12 (HH) <0.03 ng/mL    Comment: CRITICAL VALUE NOTED.  VALUE IS CONSISTENT WITH PREVIOUSLY REPORTED AND CALLED VALUE. Performed at [facility], [place] [address], [place], [place] [address]     ROS: see hpi  Physical Exam: Vitals:   07/24/17 1226 07/24/17 1342  BP:  (!) 170/45  Pulse:  68  Resp:  18  Temp:  97.8 F (36.6 C)  SpO2: 99% 100%     General: alert Chronically ill AAF , NAD  Sl anxious concerning her Diagnosis but not in distress  HEENT: [place] , MM dry  No icterus  Neck: supple , pos jvd  Heart: RRR no m, rub, gallop Lungs: Faint wheezing bilat with decr bs at bases / un labored breathing  Abdomen: obese, soft nt, nd  Extremities: no pedla edema  Skin: no overt rash  Neuro: OX3, alert moves all etrem  No overt focal deficits  Dialysis Access: pos bruit LUA Hero graft     OP HD + MWF [facility]  3 hr 45 min  EDW = 74.5 kg 2k, 2.5 ca bath                   LUA  Hero graft  Uf profile 2                   Hec. 2 mcg q hd, Mircera 30 mcg q 4  wks (last given2/20/19)                  Heparin 4000                  OP HD Meds=  Sensipar 90 mg q day, Renvela 800 mg 3 Ac                                           And 2 snacks    Assessment/Plan 1. SOB/ CP/ Volume overload - hd today lower edw at dc [place] on exam and pulm edema by CXR, prob cause of dyspnea is vol overload and lean body wt loss. Plan HD today.  2. COPD Exacerbation / ongoing Tobac Abuse- nebs / rx per admit  3.  CP/New BBBlk-  1st  set CE neg / Card . eval in process / noted 2d echo  4. ESRD -  HD MWF  5. Hypertension/volume  - as in 1./ vol off with hd  bp meds  6. Anemia  -  HGB 10.6  No esa needs for now (esa given 2/20) 7. Metabolic bone disease -  hec on hd , binder with  meal 8. Nutrition - renal carb mod   Ernest Haber, PA-C [facility] [phone] 07/24/2017, 3:16 PM   Pt seen, examined, agree w assess/plan as above with additions as indicated.  Kelly Splinter MD [employer] pager [phone]    cell [phone] 07/25/2017, 8:14 AM

## 2017-07-24 NOTE — Progress Notes (Signed)
Patient was admittedearlier this morning with shortness of breath.  Patient has history of COPD.  The patient also has end-stage renal disease on hemodialysis on Monday, Wednesday and Friday.  Patient is not volume overloaded.  Left bundle branch block was noted on presentation.  However, patient's troponin has been fairly stable.  Echo revealed an EF of 73-40%, grade 2 diastolic dysfunction, mild to moderate left ear, mild to moderate mitral regurgitation and elevated pulmonary pressures.  Patient has improved on treatment for possible COPD with exacerbation.  Patient is awaiting hemodialysis.  A cardiology input is highly appreciated.  Further management will depend on hospital course.

## 2017-07-24 NOTE — Progress Notes (Signed)
MD notified of of blood pressure of 150/46.

## 2017-07-24 NOTE — Progress Notes (Signed)
MD still has not responded to the page about changing the patient's diet. Patient is becoming increasingly upset. Also waiting to ask if patient still need to be on droplet precautions.

## 2017-07-24 NOTE — Progress Notes (Signed)
Patient is scheduled to go to hemodialysis so blood pressure medication is being held. Also she has been NPO today and sevelamer and sensipar were not given due patient not eating which causes nausea and vomiting for the patient on an empty stomach.

## 2017-07-25 DIAGNOSIS — J9621 Acute and chronic respiratory failure with hypoxia: Secondary | ICD-10-CM

## 2017-07-25 LAB — CBC
HEMATOCRIT: 31.4 % — AB (ref 36.0–46.0)
HEMOGLOBIN: 10.1 g/dL — AB (ref 12.0–15.0)
MCH: 29.6 pg (ref 26.0–34.0)
MCHC: 32.2 g/dL (ref 30.0–36.0)
MCV: 92.1 fL (ref 78.0–100.0)
Platelets: 217 10*3/uL (ref 150–400)
RBC: 3.41 MIL/uL — ABNORMAL LOW (ref 3.87–5.11)
RDW: 16.9 % — ABNORMAL HIGH (ref 11.5–15.5)
WBC: 9.5 10*3/uL (ref 4.0–10.5)

## 2017-07-25 LAB — RENAL FUNCTION PANEL
ALBUMIN: 2.3 g/dL — AB (ref 3.5–5.0)
ANION GAP: 16 — AB (ref 5–15)
BUN: 27 mg/dL — ABNORMAL HIGH (ref 6–20)
CO2: 24 mmol/L (ref 22–32)
Calcium: 8.7 mg/dL — ABNORMAL LOW (ref 8.9–10.3)
Chloride: 94 mmol/L — ABNORMAL LOW (ref 101–111)
Creatinine, Ser: 9.76 mg/dL — ABNORMAL HIGH (ref 0.44–1.00)
GFR calc Af Amer: 4 mL/min — ABNORMAL LOW (ref >60)
GFR calc non Af Amer: 3 mL/min — ABNORMAL LOW (ref >60)
GLUCOSE: 201 mg/dL — AB (ref 65–99)
PHOSPHORUS: 6.4 mg/dL — AB (ref 2.5–4.6)
POTASSIUM: 4.5 mmol/L (ref 3.5–5.1)
Sodium: 134 mmol/L — ABNORMAL LOW (ref 135–145)

## 2017-07-25 LAB — MRSA PCR SCREENING: MRSA by PCR: NEGATIVE

## 2017-07-25 LAB — GLUCOSE, CAPILLARY: Glucose-Capillary: 137 mg/dL — ABNORMAL HIGH (ref 65–99)

## 2017-07-25 LAB — D-DIMER, QUANTITATIVE (NOT AT ARMC): D-Dimer, Quant: 1.4 ug/mL-FEU — ABNORMAL HIGH (ref 0.00–0.50)

## 2017-07-25 MED ORDER — SODIUM CHLORIDE 0.9 % IV SOLN: 100.0000 mL | INTRAVENOUS | Status: DC | PRN

## 2017-07-25 MED ORDER — ALTEPLASE 2 MG IJ SOLR
2.0000 mg | Freq: Once | INTRAMUSCULAR | Status: DC | PRN
Start: 2017-07-25 — End: 2017-07-26
  Filled 2017-07-25: qty 2

## 2017-07-25 MED ORDER — HEPARIN SODIUM (PORCINE) 1000 UNIT/ML DIALYSIS
1000.0000 [IU] | INTRAMUSCULAR | Status: DC | PRN
  Filled 2017-07-25: qty 1

## 2017-07-25 MED ORDER — LORAZEPAM 0.5 MG PO TABS
0.5000 mg | ORAL_TABLET | Freq: Two times a day (BID) | ORAL | Status: DC
  Administered 2017-07-25 – 2017-07-26 (×3): 0.5 mg via ORAL
  Filled 2017-07-25 (×3): qty 1

## 2017-07-25 MED ORDER — PENTAFLUOROPROP-TETRAFLUOROETH EX AERO: 1.0000 "application " | INHALATION_SPRAY | CUTANEOUS | Status: DC | PRN

## 2017-07-25 MED ORDER — QUETIAPINE FUMARATE 25 MG PO TABS
12.5000 mg | ORAL_TABLET | Freq: Two times a day (BID) | ORAL | Status: DC
  Administered 2017-07-25 – 2017-07-26 (×3): 12.5 mg via ORAL
  Filled 2017-07-25 (×3): qty 1

## 2017-07-25 MED ORDER — PREDNISONE 20 MG PO TABS
40.0000 mg | ORAL_TABLET | Freq: Every day | ORAL | Status: DC
  Administered 2017-07-26: 40 mg via ORAL
  Filled 2017-07-25: qty 2

## 2017-07-25 MED ORDER — HEPARIN SODIUM (PORCINE) 1000 UNIT/ML DIALYSIS
4000.0000 [IU] | Freq: Once | INTRAMUSCULAR | Status: AC
  Administered 2017-07-25: 4000 [IU] via INTRAVENOUS_CENTRAL
  Filled 2017-07-25: qty 4

## 2017-07-25 NOTE — Progress Notes (Signed)
Venous GAS called to Dr. Marthenia Rolling at 16:10 by Camelia Eng RRT RCP.  PH 7.53 C02 36.1 PO2 below reportable range HC03 30.6 Continous Pulse oximetry in room reading 97% on room air
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
## 2017-07-25 NOTE — Progress Notes (Signed)
Patient transported to dialysis at this time. Report given to HD nurse

## 2017-07-25 NOTE — Plan of Care (Signed)
Provided education to patient regarding effects of ESRD on CHF, treatment adherence/completion, fluid overload, edw, loss of wt.  Discussed with patient ongoing early treatment termination at outpatient facility.  She states this is d/t her need to care for her niece that is mentally challenged.  Explored options to prevent early trmt termination, including, her 2 daughters to assist (not an option per patient), adult day care for [place] (not an option d/t [name]'s work schedule), Niece returning to live with her brother (not financially viable).  We did discuss option of [name] working 1 additional hour on HD days to allow Russell to complete trmts.  Gently reminded patient that if she does not care for herself, then this negatively impacts her health, which in the end will shorten the amount of time she has to care for [facility].  She verbalized understanding of all areas of the discussion.  Also encourage Ms. Messineo to speak to Education officer, museum at dialysis facility when she has questions regarding available resources.

## 2017-07-26 ENCOUNTER — Inpatient Hospital Stay (HOSPITAL_COMMUNITY): Payer: Medicare Other

## 2017-07-26 LAB — HEPATITIS B SURFACE ANTIGEN: HEP B S AG: NEGATIVE

## 2017-07-26 MED ORDER — TIOTROPIUM BROMIDE MONOHYDRATE 18 MCG IN CAPS: 18.0000 ug | ORAL_CAPSULE | Freq: Every day | RESPIRATORY_TRACT | 2 refills | Status: DC

## 2017-07-26 MED ORDER — TECHNETIUM TC 99M DIETHYLENETRIAME-PENTAACETIC ACID
30.1000 | Freq: Once | INTRAVENOUS | Status: AC | PRN
  Administered 2017-07-26: 30.1 via RESPIRATORY_TRACT

## 2017-07-26 MED ORDER — TECHNETIUM TO 99M ALBUMIN AGGREGATED
4.2000 | Freq: Once | INTRAVENOUS | Status: AC | PRN
  Administered 2017-07-26: 4.2 via INTRAVENOUS

## 2017-07-26 MED ORDER — PREDNISONE 10 MG PO TABS: ORAL_TABLET | ORAL | 0 refills | Status: DC

## 2017-07-26 MED ORDER — TECHNETIUM TC 99M DIETHYLENETRIAME-PENTAACETIC ACID: 30.1000 | Freq: Once | INTRAVENOUS | Status: DC | PRN

## 2017-07-26 MED ORDER — BUDESONIDE-FORMOTEROL FUMARATE 160-4.5 MCG/ACT IN AERO: 2.0000 | INHALATION_SPRAY | Freq: Two times a day (BID) | RESPIRATORY_TRACT | 12 refills | Status: AC

## 2017-07-26 MED ORDER — CLONIDINE HCL 0.3 MG PO TABS: 0.3000 mg | ORAL_TABLET | Freq: Three times a day (TID) | ORAL | 0 refills | Status: DC

## 2017-07-26 NOTE — Discharge Summary (Signed)
Physician Discharge Summary  Patient ID: [name] MRN: [id number] DOB/AGE: 10-09-1940 77 y.o.  Admit date: 07/23/2017 Discharge date: 07/26/2017  Admission Diagnoses:  Discharge Diagnoses:  Principal Problem:   Acute on chronic respiratory failure with hypoxia ([place]) Active Problems:   HYPERCHOLESTEROLEMIA   Gout, unspecified   TOBACCO ABUSE   Major depression in partial remission (HCC)   GASTROESOPHAGEAL REFLUX, NO ESOPHAGITIS   Hypertension   COPD exacerbation (HCC)   ESRD on dialysis ([place])   Chronic obstructive pulmonary disease (HCC)   Left bundle branch block   Chest pain   Discharged Condition: stable  Brief Narrative: Patient is a 77 year old African-American female past medical history significant forESRD-HD (MWF),hypertension, hyperlipidemia, COPD, TIA, GERD, gout, depression, seizure,diverticulitis, anemia, adrenal mass, tobacco abuse, and CAD.  Patient presented with with shortness breath and chest pain.  The patient was seen by the cardiology team for chest pain, and the cardiology team has cleared patient for discharge.  Chest pain is said to be noncardiac.  Patient was not volume overloaded on admission.  Patient was treated for COPD exacerbation.  Patient's shortness of breath has improved significantly.  Wheezing has also improved.  Patient goes into panic mode and starts to hyperventilate.  Patient's daughter confirm that intermittent hyperventilation is chronic. Patient reported vague pain on deep breathing.    D-dimer was elevated, but patient has chronic kidney disease on hemodialysis.  VQ scan was low probability for pulmonary embolism.  The pain has resolved.  And patient is eager to be discharged back on today.  Echocardiogram done revealed EF of 54-00%, grade 2 diastolic dysfunction, mild to moderate aortic stenosis, moderate aortic regurgitation, mild to moderate mitral regurgitation, moderately dilated left atrium, PA peak pressure 55 mmHg.   Hospital  Course:  Acute on chronic respiratory failure with hypoxia, secondary to COPD exacerbation:This was managed with nebulizer treatment, steroids and antibiotics.  Respiratory failure has resolved.  COPD exacerbation has resolved.  Patient has been counseled to quit cigarette use.    Chest pain, new LBBB and elevated trop:pt has reproducible chest wall tenderness. She was found to have a new LBBB. Troponin is elevated 0.06. This is in the setting ofESRD.Cardiology, Dr.Chakravarttiwas consulted.  A cardiology team saw patient in advised that the chest pain was likely noncardiac.  Echo finding is as above.  Chest pain has resolved.  The VQ scan is low probability for PE  HLD: -lipitor  Gout: -continue home allopurinol  Tobacco abuse: -Did counseling about importance of quitting smoking  Depression:Stable, no suicidal or homicidal ideations. -Continue home medications:Cymbalta  GERD: -Protonix  ESRD (end stage renal disease) on dialysis (MWF): -Left message to renal box for HD  ESRD (end stage renal disease) on dialysis (MWF):Patient was seen by the nephrology team.  The patient underwent one session of hemodialysis prior to discharge.  Consults: nephrology  Significant Diagnostic Studies: Echo as above.  VQ scan was low probability for pulmonary embolism  Discharge Exam: Blood pressure (!) 150/37, pulse 63, temperature (!) 97.3 F (36.3 C), resp. rate 16, weight 70.8 kg (156 lb 1.4 oz), SpO2 93 %.   Disposition: 01-Home or Self Care  Discharge Instructions    Call MD for:   Complete by:  As directed    Please call MD if symptoms worsen.   Diet - low sodium heart healthy   Complete by:  As directed    Increase activity slowly   Complete by:  As directed      Allergies as of  07/26/2017      Reactions   Tuberculin Tests Hives   "blisters"   Valacyclovir Other (See Comments)   Confusion and nervousness   Codeine Nausea And Vomiting   Penicillins Rash    No problems breathing. Has tolerated omnicef in past without issue Has patient had a PCN reaction causing immediate rash, facial/tongue/throat swelling, SOB or lightheadedness with hypotension: Yes Has patient had a PCN reaction causing severe rash involving mucus membranes or skin necrosis: No Has patient had a PCN reaction that required hospitalization No Has patient had a PCN reaction occurring within the last 10 years: No If all of the above answers are "NO", then may proceed with Cephalosporin   Sulfamethoxazole Rash      Medication List    STOP taking these medications   acetaminophen 500 MG tablet Commonly known as:  TYLENOL   LEG CRAMP RELIEF PO     TAKE these medications   albuterol 108 (90 Base) MCG/ACT inhaler Commonly known as:  PROVENTIL HFA;VENTOLIN HFA Inhale 2 puffs into the lungs every 4 (four) hours as needed for wheezing or shortness of breath.   allopurinol 100 MG tablet Commonly known as:  ZYLOPRIM Take 100 mg by mouth once daily on non-dialysis days (Tues/Thurs/Sat/Sun) What changed:    how much to take  how to take this  when to take this  additional instructions   amLODipine 10 MG tablet Commonly known as:  NORVASC TAKE 1 TABLET BY MOUTH AT BEDTIME What changed:    how much to take  how to take this  when to take this   aspirin EC 81 MG tablet Take 81 mg by mouth daily.   atorvastatin 40 MG tablet Commonly known as:  LIPITOR Take 1 tablet (40 mg total) by mouth daily. What changed:  when to take this   azelastine 0.1 % nasal spray Commonly known as:  ASTELIN Place 1 spray into both nostrils 2 (two) times daily. Use in each nostril as directed What changed:    when to take this  reasons to take this  additional instructions   budesonide-formoterol 160-4.5 MCG/ACT inhaler Commonly known as:  SYMBICORT Inhale 2 puffs into the lungs 2 (two) times daily.   cinacalcet 90 MG tablet Commonly known as:  SENSIPAR Take 90 mg by  mouth at bedtime.   cloNIDine 0.3 MG tablet Commonly known as:  CATAPRES Take 1 tablet (0.3 mg total) by mouth 3 (three) times daily. What changed:    medication strength  how much to take  when to take this   dicyclomine 10 MG capsule Commonly known as:  BENTYL Take 1 capsule (10 mg total) by mouth 3 (three) times daily.   DULoxetine 30 MG capsule Commonly known as:  CYMBALTA Take 2 capsules (60 mg total) by mouth daily. What changed:    how much to take  when to take this   fluticasone 50 MCG/ACT nasal spray Commonly known as:  FLONASE Place 2 sprays into both nostrils daily as needed for allergies or rhinitis.   hydrALAZINE 10 MG tablet Commonly known as:  APRESOLINE Take 1 tablet (10 mg total) by mouth 2 (two) times daily.   ipratropium 17 MCG/ACT inhaler Commonly known as:  ATROVENT HFA Inhale 2 puffs into the lungs every 4 (four) hours as needed for wheezing.   lidocaine-prilocaine cream Commonly known as:  EMLA Apply 1 application topically See admin instructions. Apply topically one hour before dialysis - Monday, Wednesday, Friday   loratadine 10  MG tablet Commonly known as:  CLARITIN Take 1 tablet (10 mg total) by mouth daily as needed for allergies.   losartan 100 MG tablet Commonly known as:  COZAAR Take 1 tablet (100 mg total) by mouth daily.   metoprolol succinate 100 MG 24 hr tablet Commonly known as:  TOPROL-XL TAKE 1 TABLET BY MOUTH TWICE DAILY What changed:    how much to take  how to take this  when to take this   multivitamin Tabs tablet Take 1 tablet by mouth at bedtime. What changed:  when to take this   omeprazole 40 MG capsule Commonly known as:  PRILOSEC Take 1 capsule (40 mg total) by mouth 2 (two) times daily.   predniSONE 10 MG tablet Commonly known as:  DELTASONE Prednisone 40 mg po once daily for 3 days, then 30 mg po daily for 3 days, then 20 mg po once daily for 3 days and then 10 mg po once daily for 3 days.    pregabalin 100 MG capsule Commonly known as:  LYRICA Take 100 mg by mouth at bedtime.   sevelamer carbonate 800 MG tablet Commonly known as:  RENVELA Take 2,400 mg by mouth See admin instructions. Take 3 tablets (2400 mg) by mouth with each meal or snack - once or twice daily   tiotropium 18 MCG inhalation capsule Commonly known as:  SPIRIVA HANDIHALER Place 1 capsule (18 mcg total) into inhaler and inhale daily.      32 minutes spent discharging patient.  SignedBonnell Public 07/26/2017, 1:58 PM

## 2017-07-26 NOTE — Progress Notes (Signed)
[name] to be D/C'd Home per MD order.  Discussed with the patient and all questions fully answered.  VSS, Skin clean, dry and intact without evidence of skin break down, no evidence of skin tears noted. IV catheter discontinued intact. Site without signs and symptoms of complications. Dressing and pressure applied.  An After Visit Summary was printed and given to the patient. Patient received prescription.  D/c education completed with patient/family including follow up instructions, medication list, d/c activities limitations if indicated, with other d/c instructions as indicated by MD - patient able to verbalize understanding, all questions fully answered.   Patient instructed to return to ED, call 911, or call MD for any changes in condition.   Patient escorted via [place], and D/C home via private auto.  [place] 07/26/2017 3:56 PM

## 2017-07-27 ENCOUNTER — Telehealth: Payer: Self-pay | Admitting: Family Medicine

## 2017-07-27 DIAGNOSIS — N186 End stage renal disease: Secondary | ICD-10-CM | POA: Diagnosis not present

## 2017-07-27 DIAGNOSIS — Z992 Dependence on renal dialysis: Secondary | ICD-10-CM | POA: Diagnosis not present

## 2017-07-27 DIAGNOSIS — N2581 Secondary hyperparathyroidism of renal origin: Secondary | ICD-10-CM | POA: Diagnosis not present

## 2017-07-27 DIAGNOSIS — D631 Anemia in chronic kidney disease: Secondary | ICD-10-CM | POA: Diagnosis not present

## 2017-07-27 LAB — BLOOD GAS, VENOUS
Acid-Base Excess: 7.5 mmol/L — ABNORMAL HIGH (ref 0.0–2.0)
Bicarbonate: 30.6 mmol/L — ABNORMAL HIGH (ref 20.0–28.0)
Drawn by: 27022
O2 Saturation: 32.7 %
Patient temperature: 98.6
pCO2, Ven: 36.1 mmHg — ABNORMAL LOW (ref 44.0–60.0)
pH, Ven: 7.537 — ABNORMAL HIGH (ref 7.250–7.430)

## 2017-07-27 NOTE — Telephone Encounter (Signed)
Transition Care Management Follow-up Telephone Call Patient ID: [name] MRN: [id number] DOB/AGE: 10-Jul-1940 77 y.o.  Admit date: 07/23/2017 Discharge date: 07/26/2017  Admission Diagnoses:  Discharge Diagnoses:  Principal Problem:   Acute on chronic respiratory failure with hypoxia ([place]) Active Problems:   HYPERCHOLESTEROLEMIA   Gout, unspecified   TOBACCO ABUSE   Major depression in partial remission (HCC)   GASTROESOPHAGEAL REFLUX, NO ESOPHAGITIS   Hypertension   COPD exacerbation (HCC)   ESRD on dialysis ([place])   Chronic obstructive pulmonary disease (HCC)   Left bundle branch block   Chest pain   Discharged Condition: stable    How have you been since you were released from the hospital? "better"   Do you understand why you were in the hospital? yes   Do you understand the discharge instructions? yes  Where were you discharged to? Home  Items Reviewed:  Medications reviewed: yes  Allergies reviewed: yes  Dietary changes reviewed: yes  Referrals reviewed: yes   Functional Questionnaire:   Activities of Daily Living (ADLs):   She states they are independent in the following: ambulation, bathing and hygiene, feeding, continence, grooming, toileting and dressing States they require assistance with the following: none   Any transportation issues/concerns?: no   Any patient concerns? no   Confirmed importance and date/time of follow-up visits scheduled yes  Provider Appointment booked with Dr. Martinique on August 04, 2017 at 12:00 pm  Confirmed with patient if condition begins to worsen call PCP or go to the ER.  Patient was given the office number and encouraged to call back with question or concerns.  : yes

## 2017-07-27 NOTE — Telephone Encounter (Signed)
Patient just just got out of hospital yesterday, was given printed scripts for Prednisone & Symbicort, wants to know should she have these filled and start on? Also request a script for Nicotine patches and if approved send to [facility] on [address], can give [name] (daughter ) a call back on cell [phone], she is listed on DPR.

## 2017-07-27 NOTE — Telephone Encounter (Signed)
Does she mean PCS evaluation? Thanks, BJ

## 2017-07-28 NOTE — Telephone Encounter (Signed)
Spoke with patient, she stated that she needed PCS services, she need someone to help her with ADL's, meal prep and she needed someone to take her to dialysis.

## 2017-07-28 NOTE — Telephone Encounter (Signed)
Informed patient, per Dr. Martinique that she needed to take medications prescribed by the hospital. Patient verbalized understanding.

## 2017-07-28 NOTE — Telephone Encounter (Signed)
She should follow recommendations and it includes starting treatment as instructed ,unless there is a prior Hx of reaction to these meds. These medications were prescribed because COPD exacerbation, they help with acute symptoms (Prednisone) and with prevention of future exacerbations (Symbicort).  Thanks, BJ

## 2017-07-29 DIAGNOSIS — N186 End stage renal disease: Secondary | ICD-10-CM | POA: Diagnosis not present

## 2017-07-29 DIAGNOSIS — D631 Anemia in chronic kidney disease: Secondary | ICD-10-CM | POA: Diagnosis not present

## 2017-07-29 DIAGNOSIS — N2581 Secondary hyperparathyroidism of renal origin: Secondary | ICD-10-CM | POA: Diagnosis not present

## 2017-07-29 DIAGNOSIS — Z992 Dependence on renal dialysis: Secondary | ICD-10-CM | POA: Diagnosis not present

## 2017-07-29 LAB — CULTURE, BLOOD (ROUTINE X 2)
CULTURE: NO GROWTH
Culture: NO GROWTH
SPECIAL REQUESTS: ADEQUATE
Special Requests: ADEQUATE

## 2017-07-29 NOTE — Telephone Encounter (Signed)
It is Ok to provide a letter saying that she has Hx of ESRD on dialysis,HTN,unstable gait,and COPD. Due to all these chronic medical problems she needs assistance with ADL's at home.  She needs to let us know where to fax letter. Thanks, BJ

## 2017-07-30 ENCOUNTER — Ambulatory Visit: Payer: Medicare Other | Admitting: Gastroenterology

## 2017-07-30 DIAGNOSIS — M109 Gout, unspecified: Secondary | ICD-10-CM | POA: Diagnosis not present

## 2017-07-30 DIAGNOSIS — N186 End stage renal disease: Secondary | ICD-10-CM | POA: Diagnosis not present

## 2017-07-30 DIAGNOSIS — K589 Irritable bowel syndrome without diarrhea: Secondary | ICD-10-CM | POA: Diagnosis not present

## 2017-07-30 DIAGNOSIS — I2510 Atherosclerotic heart disease of native coronary artery without angina pectoris: Secondary | ICD-10-CM | POA: Diagnosis not present

## 2017-07-30 DIAGNOSIS — Z992 Dependence on renal dialysis: Secondary | ICD-10-CM | POA: Diagnosis not present

## 2017-07-30 DIAGNOSIS — F329 Major depressive disorder, single episode, unspecified: Secondary | ICD-10-CM | POA: Diagnosis not present

## 2017-07-30 DIAGNOSIS — I447 Left bundle-branch block, unspecified: Secondary | ICD-10-CM | POA: Diagnosis not present

## 2017-07-30 DIAGNOSIS — M1990 Unspecified osteoarthritis, unspecified site: Secondary | ICD-10-CM | POA: Diagnosis not present

## 2017-07-30 DIAGNOSIS — Z8673 Personal history of transient ischemic attack (TIA), and cerebral infarction without residual deficits: Secondary | ICD-10-CM | POA: Diagnosis not present

## 2017-07-30 DIAGNOSIS — I132 Hypertensive heart and chronic kidney disease with heart failure and with stage 5 chronic kidney disease, or end stage renal disease: Secondary | ICD-10-CM | POA: Diagnosis not present

## 2017-07-30 DIAGNOSIS — J441 Chronic obstructive pulmonary disease with (acute) exacerbation: Secondary | ICD-10-CM | POA: Diagnosis not present

## 2017-07-30 DIAGNOSIS — I509 Heart failure, unspecified: Secondary | ICD-10-CM | POA: Diagnosis not present

## 2017-07-30 DIAGNOSIS — E785 Hyperlipidemia, unspecified: Secondary | ICD-10-CM | POA: Diagnosis not present

## 2017-07-30 DIAGNOSIS — Z720 Tobacco use: Secondary | ICD-10-CM | POA: Diagnosis not present

## 2017-07-30 DIAGNOSIS — K219 Gastro-esophageal reflux disease without esophagitis: Secondary | ICD-10-CM | POA: Diagnosis not present

## 2017-07-30 DIAGNOSIS — K5793 Diverticulitis of intestine, part unspecified, without perforation or abscess with bleeding: Secondary | ICD-10-CM | POA: Diagnosis not present

## 2017-07-30 DIAGNOSIS — D631 Anemia in chronic kidney disease: Secondary | ICD-10-CM | POA: Diagnosis not present

## 2017-07-30 DIAGNOSIS — J9621 Acute and chronic respiratory failure with hypoxia: Secondary | ICD-10-CM | POA: Diagnosis not present

## 2017-07-31 ENCOUNTER — Telehealth: Payer: Self-pay | Admitting: Family Medicine

## 2017-07-31 DIAGNOSIS — N2581 Secondary hyperparathyroidism of renal origin: Secondary | ICD-10-CM | POA: Diagnosis not present

## 2017-07-31 DIAGNOSIS — Z992 Dependence on renal dialysis: Secondary | ICD-10-CM | POA: Diagnosis not present

## 2017-07-31 DIAGNOSIS — N186 End stage renal disease: Secondary | ICD-10-CM | POA: Diagnosis not present

## 2017-07-31 DIAGNOSIS — D631 Anemia in chronic kidney disease: Secondary | ICD-10-CM | POA: Diagnosis not present

## 2017-07-31 DIAGNOSIS — E1129 Type 2 diabetes mellitus with other diabetic kidney complication: Secondary | ICD-10-CM | POA: Diagnosis not present

## 2017-07-31 NOTE — Telephone Encounter (Signed)
Copied from [place] [id number]. Topic: Quick Communication - See Telephone Encounter >> Jul 31, 2017  8:38 AM Burnis Medin, NT wrote: CRM for notification. See Telephone encounter for: Merry Proud is calling to get verbal orders for home physical therapy twice a week for four weeks followed by once a week for four weeks for gait strength and transfer training. Also a nursing consult. Pt received some new medications in the hospital.   07/31/17.

## 2017-07-31 NOTE — Telephone Encounter (Signed)
Spoke with Merry Proud, gave verbal orders for physical therapy.

## 2017-08-03 ENCOUNTER — Encounter: Payer: Self-pay | Admitting: *Deleted

## 2017-08-03 DIAGNOSIS — Z992 Dependence on renal dialysis: Secondary | ICD-10-CM | POA: Diagnosis not present

## 2017-08-03 DIAGNOSIS — N186 End stage renal disease: Secondary | ICD-10-CM | POA: Diagnosis not present

## 2017-08-03 DIAGNOSIS — D631 Anemia in chronic kidney disease: Secondary | ICD-10-CM | POA: Diagnosis not present

## 2017-08-03 DIAGNOSIS — N2581 Secondary hyperparathyroidism of renal origin: Secondary | ICD-10-CM | POA: Diagnosis not present

## 2017-08-03 NOTE — Progress Notes (Signed)
HPI:   [name] is a 77 y.o. female, who is here today with her daughter to follow on recent hospitalization.   She was admitted on 07/23/2017 and discharged on 07/26/2017. [place] call 07/27/17.  Discharge diagnosis: Acute on chronic respiratory failure with hypoxia. COPD exacerbation.  She presented to the ER complaining of dyspnea and chest pain.  New LBBB and elevated troponins at 0.06. Echo with LVEF 55-60% and grade 2 diastolic dysfunction.  V/Q scan low probability for PE. She was evaluated by cardiologist during hospital stay, chest pain was deemed not to be cardiac etiology.  COPD: She is reporting improvement in cough and dyspnea. Currently she is on Symbicort 160-4.5 mcg twice daily. Since hospital discharge she has not needed Albuterol inhaler. She is not using Atrovent inhaler. She is still on Prednisone 20 mg daily.  According to patient, she has been told that she still has some "crackles" in right lung upon auscultation while she is in dialysis.  She has not had chills or fever.  Still smoking. Nicotine patches are not helping. Her daughter would like Rx for Chantix.  Hx of depression, she is on Cymbalta 60 mg daily. Peripheral neuropathy, she is on Lyrica 100 mg daily.  Hypertension: She is concerned about weight loss and low BP. Currently she is on Metoprolol Succinate 150 mg daily, Hydralazine 10 mg twice daily, Amlodipine 10 mg daily, Clonidine 0.3 mg 3 times per day, and Losartan 100 mg daily. She is having lightheadedness upon getting up. She denies chest pain, palpitation, or diaphoresis.  She has required IV fluids after dialysis due to hypotension. She is  She has not been consistent with low salt diet and states that Prednisone increases her appetite.   Next week appt with urology to follow on abdominal CT findings, 07/21/17. CT abdomen pelvics with contrast: 1. Extensive colonic diverticulosis, greatest distally where there is  some wall thickening in the sigmoid colon. No surrounding inflammation to suggest active diverticulitis. No evidence of bowel perforation or obstruction. 2. Indeterminate lesion centrally in the left kidney has enlarged from previous noncontrast study of 2016. Although incompletely characterized by this study, this was felt to be a cyst on prior ultrasound. Consider dedicated renal CT (without and with contrast) to exclude subtle enhancement. 3. Extensive Aortic Atherosclerosis (ICD10-I70.0). 4. Ventral hernia containing only fat.   Chest CT without contrast on 07/23/2017:  1. Bronchial and peribronchial thickening compatible with chronic bronchitic change. Bibasilar atelectasis is noted. 2. Cardiomegaly with coronary arteriosclerosis and aortic atherosclerosis. 3. Centrilobular emphysema, upper lobe predominant. 4. Left adrenal nodule compatible with adenoma measuring 13 mm. 5. Moderate right-sided renal cortical scarring with renal cysts. 6. Colonic diverticulosis without acute diverticulitis.   Aortic Atherosclerosis (ICD10-I70.0) and Emphysema (ICD10-J43.9).   Unstable gait: She is still using a cane. According to patient, she was instructed to stop Acetaminophen, which she was taking for lower back pain. She denies falls since hospital discharge.   HH has been arranged. PT x 2 per week and planning on starting tomorrow. OT once per week. Nurse once weekly.  Allergic rhinitis: She requested refill on Claritin 10 mg, which help with epiphora, nasal congestion, and itchy eyes. No fever or chills.   Review of Systems  Constitutional: Positive for appetite change and fatigue. Negative for activity change and fever.  HENT: Positive for congestion, postnasal drip and rhinorrhea. Negative for mouth sores and trouble swallowing.   Eyes: Positive for discharge and itching. Negative for redness  and visual disturbance.  Respiratory: Negative for cough, shortness of breath and  wheezing.   Cardiovascular: Negative for chest pain, palpitations and leg swelling.  Gastrointestinal: Negative for abdominal pain, nausea and vomiting.       Negative for changes in bowel habits.  Genitourinary: Negative for dysuria and hematuria.  Musculoskeletal: Positive for back pain and gait problem.  Skin: Negative for rash and wound.  Allergic/Immunologic: Positive for environmental allergies.  Neurological: Positive for light-headedness and numbness. Negative for syncope and headaches.  Psychiatric/Behavioral: Negative for confusion. The patient is nervous/anxious.       Current Outpatient Medications on File Prior to Visit  Medication Sig Dispense Refill  . allopurinol (ZYLOPRIM) 100 MG tablet Take 100 mg by mouth once daily on non-dialysis days (Tues/Thurs/Sat/Sun) (Patient taking differently: Take 100 mg by mouth See admin instructions. Take one tablet (100 mg) by mouth once daily on non-dialysis days (Tues/Thurs/Sat/Sun)) 30 tablet 0  . amLODipine (NORVASC) 10 MG tablet TAKE 1 TABLET BY MOUTH AT BEDTIME (Patient taking differently: TAKE 1 TABLET (10 MG) BY MOUTH AT BEDTIME) 90 tablet 1  . aspirin EC 81 MG tablet Take 81 mg by mouth daily.     Marland Kitchen atorvastatin (LIPITOR) 40 MG tablet Take 1 tablet (40 mg total) by mouth daily. (Patient taking differently: Take 40 mg by mouth at bedtime. ) 90 tablet 3  . budesonide-formoterol (SYMBICORT) 160-4.5 MCG/ACT inhaler Inhale 2 puffs into the lungs 2 (two) times daily. 1 Inhaler 12  . cinacalcet (SENSIPAR) 90 MG tablet Take 90 mg by mouth at bedtime.    . cloNIDine (CATAPRES) 0.3 MG tablet Take 1 tablet (0.3 mg total) by mouth 3 (three) times daily. 90 tablet 0  . dicyclomine (BENTYL) 10 MG capsule Take 1 capsule (10 mg total) by mouth 3 (three) times daily. 90 capsule 11  . DULoxetine (CYMBALTA) 30 MG capsule Take 2 capsules (60 mg total) by mouth daily. (Patient taking differently: Take 30 mg by mouth at bedtime. ) 180 capsule 3  .  lidocaine-prilocaine (EMLA) cream Apply 1 application topically See admin instructions. Apply topically one hour before dialysis - Monday, Wednesday, Friday    . losartan (COZAAR) 100 MG tablet Take 1 tablet (100 mg total) by mouth daily. 90 tablet 1  . metoprolol succinate (TOPROL-XL) 100 MG 24 hr tablet TAKE 1 TABLET BY MOUTH TWICE DAILY (Patient taking differently: TAKE 1 TABLET (100 MG)  BY MOUTH TWICE DAILY) 180 tablet 1  . multivitamin (RENA-VIT) TABS tablet Take 1 tablet by mouth at bedtime. (Patient taking differently: Take 1 tablet by mouth daily. ) 60 tablet 5  . omeprazole (PRILOSEC) 40 MG capsule Take 1 capsule (40 mg total) by mouth 2 (two) times daily. 60 capsule 11  . predniSONE (DELTASONE) 10 MG tablet Prednisone 40 mg po once daily for 3 days, then 30 mg po daily for 3 days, then 20 mg po once daily for 3 days and then 10 mg po once daily for 3 days. 30 tablet 0  . pregabalin (LYRICA) 100 MG capsule Take 100 mg by mouth at bedtime.     . sevelamer carbonate (RENVELA) 800 MG tablet Take 2,400 mg by mouth See admin instructions. Take 3 tablets (2400 mg) by mouth with each meal or snack - once or twice daily    . albuterol (PROVENTIL HFA;VENTOLIN HFA) 108 (90 Base) MCG/ACT inhaler Inhale 2 puffs into the lungs every 4 (four) hours as needed for wheezing or shortness of breath. (Patient not taking:  Reported on 08/04/2017) 1 Inhaler 1  . azelastine (ASTELIN) 0.1 % nasal spray Place 1 spray into both nostrils 2 (two) times daily. Use in each nostril as directed (Patient not taking: Reported on 08/04/2017) 30 mL 6  . fluticasone (FLONASE) 50 MCG/ACT nasal spray Place 2 sprays into both nostrils daily as needed for allergies or rhinitis. (Patient not taking: Reported on 08/04/2017) 16 g 3   No current facility-administered medications on file prior to visit.      Past Medical History:  Diagnosis Date  . Abnormality of gait 03/28/2015  . Adrenal mass ([place])   . 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 Arthritis   . Back pain   . CHF (congestive heart failure) ([place])   . Chronic kidney disease    Hemo MWF  . Congestion of throat    Pt states she has a lot mucus in back throat.  . Constipation   . COPD (chronic obstructive pulmonary disease) ([place])   . Depression   . Dialysis patient [facility])    kidney  . Diverticulitis   . GERD (gastroesophageal reflux disease)   . H/O hiatal hernia   . High cholesterol   . Hoarseness of voice   . Hyperlipidemia   . Hypertension   . IBS (irritable bowel syndrome)   . Meralgia paresthetica of right side 12/26/2014  . Normal cardiac stress test 12/24/2009   lexiscan, imaging normal  . Renal disorder   . RLS (restless legs syndrome)   . Seizures ([place])    2004 past brain surgery  . Sinus complaint   . Thyroid disease   . TIA (transient ischemic attack)   . Tubular adenoma of colon 01/2008   Allergies  Allergen Reactions  . Tuberculin Tests Hives    "blisters"  . Valacyclovir Other (See Comments)    Confusion and nervousness  . Codeine Nausea And Vomiting  . Penicillins Rash    No problems breathing. Has tolerated omnicef in past without issue Has patient had a PCN reaction causing immediate rash, facial/tongue/throat swelling, SOB or lightheadedness with hypotension: Yes Has patient had a PCN reaction causing severe rash involving mucus membranes or skin necrosis: No Has patient had a PCN reaction that required hospitalization No Has patient had a PCN reaction occurring within the last 10 years: No If all of the above answers are "NO", then may proceed with Cephalosporin  . Sulfamethoxazole Rash    Social History   Socioeconomic History  . Marital status: Single    Spouse name: None  . Number of children: 3  . Years of education: 68  . Highest education level: None  Social Needs  . Financial resource strain: None  . Food insecurity - worry: None  . Food insecurity - inability: None   . Transportation needs - medical: None  . Transportation needs - non-medical: None  Occupational History  . Occupation: retired  Tobacco Use  . Smoking status: Current Every Day Smoker    Packs/day: 1.00    Years: 60.00    Pack years: 60.00    Types: Cigarettes  . Smokeless tobacco: Never Used  . Tobacco comment: smoker since 77 yo.  1.5 ppd of [place] 100 lights. ; form given 10-23-16  Substance and Sexual Activity  . Alcohol use: No    Alcohol/week: 0.0 oz  . Drug use: No    Comment: No hx of IV drug use  . Sexual activity:  No    Birth control/protection: None  Other Topics Concern  . None  Social History Narrative   ** Merged History Encounter **       Lives in [place] with Daughter, granddaughters (twins) and mentally challenged niece.   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   Patient has a college education.   Patient drinks one cup of caffeine daily.             Vitals:   08/04/17 1213  BP: (!) 110/50  Pulse: 80  Resp: 12  Temp: 98.8 F (37.1 C)   Body mass index is 30.61 kg/m.   Physical Exam  Nursing note and vitals reviewed. Constitutional: She is oriented to person, place, and time. She appears well-developed. No distress.  HENT:  Head: Normocephalic and atraumatic.  Mouth/Throat: Oropharynx is clear and moist and mucous membranes are normal.  Eyes: Conjunctivae are normal.  Cardiovascular: Normal rate and regular rhythm.  Murmur (SEM I/VI LUSB-RUSB) heard. Pulses:      Dorsalis pedis pulses are 2+ on the right side, and 2+ on the left side.  Respiratory: Effort normal and breath sounds normal. No respiratory distress.  GI: Soft. She exhibits no mass. There is no tenderness.  Musculoskeletal: She exhibits no edema.  Lymphadenopathy:    She has no cervical adenopathy.  Neurological: She is alert and oriented to person, place, and time. She has normal strength.  Unstable gait assisted with a cane.  Skin: Skin is warm. No erythema.   Psychiatric: Her mood appears anxious.  Well groomed, good eye contact.    ASSESSMENT AND PLAN:  Ms. Valentine was seen today for follow-up.  Diagnoses and all orders for this visit:    Allergic rhinitis She will continue Claritin 10 mg every other day. Nasal saline irrigations my also help.  TOBACCO ABUSE We discussed adverse effects of tobacco use and benefits of smoking cessation. Nicotine patch is not helping. She is interested in trying Chantix, so she will take 0.5 mg daily for 11 weeks. Follow-up in 8 weeks.   Chronic obstructive pulmonary disease (HCC) Improved. Complete Prednisone taper. Continue Symbicort 160-4.5 mcg twice daily. Continue Albuterol inhaler as needed. We will discontinue Atrovent since she has not needed it. Encouraged smoking cessation. Instructed about warning signs. Follow-up in 8 weeks.  ESRD on dialysis [facility]) Educated about symptoms and prognosis as well as effects of hemodialysis. Next hemodialysis section is tomorrow. Continue following with nephrologist.  Hypertensive renal disease, malignant, with renal failure Because episodes of hypotension, she was discontinue Hydroxyzine. She will continue rest of her antihypertensive medications at the same dose. She will have BP check at hemodialysis, 3 times per week. Strongly recommend compliance with low-salt diet. Instructed about warning signs. Follow-up in 8 weeks.      Betty G. Martinique, MD  [facility]. [place] office.

## 2017-08-03 NOTE — Telephone Encounter (Signed)
Letter completed. Called patient, left message with daughter letter her know letter is ready for pick-up. Patient has an appointment on 08/04/17 with PCP.

## 2017-08-04 ENCOUNTER — Ambulatory Visit (INDEPENDENT_AMBULATORY_CARE_PROVIDER_SITE_OTHER): Payer: Medicare Other | Admitting: Family Medicine

## 2017-08-04 ENCOUNTER — Encounter: Payer: Self-pay | Admitting: Family Medicine

## 2017-08-04 VITALS — BP 110/50 | HR 80 | Temp 98.8°F | Resp 12 | Ht 61.0 in | Wt 162.0 lb

## 2017-08-04 DIAGNOSIS — J441 Chronic obstructive pulmonary disease with (acute) exacerbation: Secondary | ICD-10-CM | POA: Diagnosis not present

## 2017-08-04 DIAGNOSIS — N186 End stage renal disease: Secondary | ICD-10-CM | POA: Diagnosis not present

## 2017-08-04 DIAGNOSIS — I2510 Atherosclerotic heart disease of native coronary artery without angina pectoris: Secondary | ICD-10-CM | POA: Diagnosis not present

## 2017-08-04 DIAGNOSIS — I129 Hypertensive chronic kidney disease with stage 1 through stage 4 chronic kidney disease, or unspecified chronic kidney disease: Secondary | ICD-10-CM | POA: Diagnosis not present

## 2017-08-04 DIAGNOSIS — Z992 Dependence on renal dialysis: Secondary | ICD-10-CM

## 2017-08-04 DIAGNOSIS — J309 Allergic rhinitis, unspecified: Secondary | ICD-10-CM

## 2017-08-04 DIAGNOSIS — J449 Chronic obstructive pulmonary disease, unspecified: Secondary | ICD-10-CM

## 2017-08-04 DIAGNOSIS — F17200 Nicotine dependence, unspecified, uncomplicated: Secondary | ICD-10-CM | POA: Diagnosis not present

## 2017-08-04 DIAGNOSIS — I509 Heart failure, unspecified: Secondary | ICD-10-CM | POA: Diagnosis not present

## 2017-08-04 DIAGNOSIS — I132 Hypertensive heart and chronic kidney disease with heart failure and with stage 5 chronic kidney disease, or end stage renal disease: Secondary | ICD-10-CM | POA: Diagnosis not present

## 2017-08-04 DIAGNOSIS — J9621 Acute and chronic respiratory failure with hypoxia: Secondary | ICD-10-CM | POA: Diagnosis not present

## 2017-08-04 MED ORDER — LORATADINE 10 MG PO TABS: ORAL_TABLET | ORAL | 1 refills | Status: DC

## 2017-08-04 MED ORDER — VARENICLINE TARTRATE 0.5 MG PO TABS: 0.5000 mg | ORAL_TABLET | Freq: Every day | ORAL | 1 refills | Status: DC

## 2017-08-04 NOTE — Assessment & Plan Note (Signed)
Improved. Complete Prednisone taper. Continue Symbicort 160-4.5 mcg twice daily. Continue Albuterol inhaler as needed. We will discontinue Atrovent since she has not needed it. Encouraged smoking cessation. Instructed about warning signs. Follow-up in 8 weeks.

## 2017-08-04 NOTE — Patient Instructions (Addendum)
A few things to remember from today's visit:   TOBACCO ABUSE - Plan: varenicline (CHANTIX) 0.5 MG tablet  Chronic obstructive pulmonary disease, unspecified COPD type ([place])  ESRD on dialysis ([place])  Other secondary hypertension  Hydralazine stopped.  Chantix 1 tab daily.  Continue Symbicort.   Please be sure medication list is accurate. If a new problem present, please set up appointment sooner than planned today.

## 2017-08-04 NOTE — Assessment & Plan Note (Signed)
She will continue Claritin 10 mg every other day. Nasal saline irrigations my also help.

## 2017-08-04 NOTE — Assessment & Plan Note (Signed)
Educated about symptoms and prognosis as well as effects of hemodialysis. Next hemodialysis section is tomorrow. Continue following with nephrologist.

## 2017-08-04 NOTE — Assessment & Plan Note (Signed)
We discussed adverse effects of tobacco use and benefits of smoking cessation. Nicotine patch is not helping. She is interested in trying Chantix, so she will take 0.5 mg daily for 11 weeks. Follow-up in 8 weeks.

## 2017-08-04 NOTE — Assessment & Plan Note (Signed)
Because episodes of hypotension, she was discontinue Hydroxyzine. She will continue rest of her antihypertensive medications at the same dose. She will have BP check at hemodialysis, 3 times per week. Strongly recommend compliance with low-salt diet. Instructed about warning signs. Follow-up in 8 weeks.

## 2017-08-05 ENCOUNTER — Ambulatory Visit: Payer: Self-pay

## 2017-08-05 ENCOUNTER — Telehealth: Payer: Self-pay | Admitting: Family Medicine

## 2017-08-05 ENCOUNTER — Telehealth: Payer: Self-pay | Admitting: *Deleted

## 2017-08-05 DIAGNOSIS — I509 Heart failure, unspecified: Secondary | ICD-10-CM | POA: Diagnosis not present

## 2017-08-05 DIAGNOSIS — I2510 Atherosclerotic heart disease of native coronary artery without angina pectoris: Secondary | ICD-10-CM | POA: Diagnosis not present

## 2017-08-05 DIAGNOSIS — J441 Chronic obstructive pulmonary disease with (acute) exacerbation: Secondary | ICD-10-CM | POA: Diagnosis not present

## 2017-08-05 DIAGNOSIS — N186 End stage renal disease: Secondary | ICD-10-CM | POA: Diagnosis not present

## 2017-08-05 DIAGNOSIS — D631 Anemia in chronic kidney disease: Secondary | ICD-10-CM | POA: Diagnosis not present

## 2017-08-05 DIAGNOSIS — I132 Hypertensive heart and chronic kidney disease with heart failure and with stage 5 chronic kidney disease, or end stage renal disease: Secondary | ICD-10-CM | POA: Diagnosis not present

## 2017-08-05 DIAGNOSIS — Z992 Dependence on renal dialysis: Secondary | ICD-10-CM | POA: Diagnosis not present

## 2017-08-05 DIAGNOSIS — J9621 Acute and chronic respiratory failure with hypoxia: Secondary | ICD-10-CM | POA: Diagnosis not present

## 2017-08-05 DIAGNOSIS — N2581 Secondary hyperparathyroidism of renal origin: Secondary | ICD-10-CM | POA: Diagnosis not present

## 2017-08-05 NOTE — Telephone Encounter (Signed)
Copied from [place]. Topic: Quick Communication - See Telephone Encounter >> Aug 05, 2017  9:53 AM Margot Ables wrote: CRM for notification. See Telephone encounter for: 08/05/17.  Pt BP running low this morning 112/40 sitting 114/40 standing  Pt complained of dizziness and advised PT that dialysis center advised her to stop hydralazine. Pt goes to dialysis today 11:00am and will let them know about BP today. Pt advised the PT that dialysis center is trying to fix the issue. Princess has another appt with pt Friday morning prior to her dialysis again.

## 2017-08-05 NOTE — Telephone Encounter (Signed)
PA approved through 02/01/2018.

## 2017-08-05 NOTE — Telephone Encounter (Signed)
Both patient and daughter informed of instructions per Dr. Martinique and verbalized understanding.

## 2017-08-05 NOTE — Telephone Encounter (Signed)
PA initiated for Chantix via CoverMyMeds, key [id number], awaiting determination. Please allow 1-3 days for a response. Rx BIN: [id number] Rx PCN: [id number] Rx Group: [id number]

## 2017-08-05 NOTE — Telephone Encounter (Signed)
Her daughter actually was who requested Chantix. She can continue Nicotine patch or try Nicotine gum.  Thanks, BJ

## 2017-08-05 NOTE — Telephone Encounter (Signed)
Left message for Darlene to give concerning medication.

## 2017-08-05 NOTE — Telephone Encounter (Signed)
We discussed this problem yesterday. I discontinued Hydralazine yesterday and recommend continuing monitoring BP. If BP still low and/or having dizziness we can decrease Cozaar but it is early to do so,I prefer to wait a week before we can make more changes.  Thanks, BJ

## 2017-08-05 NOTE — Telephone Encounter (Signed)
Spoke with [name] and Mrs. Jurgensen, informed that her options were Nicotine patches or gum and that at this time Dr. Martinique did not want to try patient on Wellbutrin. Daughter decided that they would hold off on the PA for Chantix until they come in for next OV to discuss further with PCP.

## 2017-08-05 NOTE — Telephone Encounter (Signed)
Pt. Reports when she went to pick up her Chantix prescribed for her, insurance refused it. Not sure what they will pay for. Request doctor call something else in to [facility] on [address].

## 2017-08-05 NOTE — Telephone Encounter (Signed)
Left message for [name] to give clinic a call back.

## 2017-08-05 NOTE — Telephone Encounter (Signed)
I spoke with [name] and she is requesting something else to replace Chantix, patient has already tried the patches and found they did not work to well.

## 2017-08-05 NOTE — Telephone Encounter (Signed)
Patient notified of results. See result note.  

## 2017-08-05 NOTE — Telephone Encounter (Signed)
Patient informed to continue to monitor B/P and given instructions per Dr. Martinique. Patient verbalized understanding. Patient stated that she was given some instructions per PT this morning on what to do before therapy and dialysis concerning b/p.

## 2017-08-06 DIAGNOSIS — I2510 Atherosclerotic heart disease of native coronary artery without angina pectoris: Secondary | ICD-10-CM | POA: Diagnosis not present

## 2017-08-06 DIAGNOSIS — J9621 Acute and chronic respiratory failure with hypoxia: Secondary | ICD-10-CM | POA: Diagnosis not present

## 2017-08-06 DIAGNOSIS — I132 Hypertensive heart and chronic kidney disease with heart failure and with stage 5 chronic kidney disease, or end stage renal disease: Secondary | ICD-10-CM | POA: Diagnosis not present

## 2017-08-06 DIAGNOSIS — N186 End stage renal disease: Secondary | ICD-10-CM | POA: Diagnosis not present

## 2017-08-06 DIAGNOSIS — I509 Heart failure, unspecified: Secondary | ICD-10-CM | POA: Diagnosis not present

## 2017-08-06 DIAGNOSIS — J441 Chronic obstructive pulmonary disease with (acute) exacerbation: Secondary | ICD-10-CM | POA: Diagnosis not present

## 2017-08-07 ENCOUNTER — Encounter (INDEPENDENT_AMBULATORY_CARE_PROVIDER_SITE_OTHER): Payer: Self-pay

## 2017-08-07 ENCOUNTER — Telehealth: Payer: Self-pay | Admitting: Gastroenterology

## 2017-08-07 DIAGNOSIS — I2510 Atherosclerotic heart disease of native coronary artery without angina pectoris: Secondary | ICD-10-CM | POA: Diagnosis not present

## 2017-08-07 DIAGNOSIS — N186 End stage renal disease: Secondary | ICD-10-CM | POA: Diagnosis not present

## 2017-08-07 DIAGNOSIS — I509 Heart failure, unspecified: Secondary | ICD-10-CM | POA: Diagnosis not present

## 2017-08-07 DIAGNOSIS — N2581 Secondary hyperparathyroidism of renal origin: Secondary | ICD-10-CM | POA: Diagnosis not present

## 2017-08-07 DIAGNOSIS — J9621 Acute and chronic respiratory failure with hypoxia: Secondary | ICD-10-CM | POA: Diagnosis not present

## 2017-08-07 DIAGNOSIS — J441 Chronic obstructive pulmonary disease with (acute) exacerbation: Secondary | ICD-10-CM | POA: Diagnosis not present

## 2017-08-07 DIAGNOSIS — D631 Anemia in chronic kidney disease: Secondary | ICD-10-CM | POA: Diagnosis not present

## 2017-08-07 DIAGNOSIS — I132 Hypertensive heart and chronic kidney disease with heart failure and with stage 5 chronic kidney disease, or end stage renal disease: Secondary | ICD-10-CM | POA: Diagnosis not present

## 2017-08-07 DIAGNOSIS — Z992 Dependence on renal dialysis: Secondary | ICD-10-CM | POA: Diagnosis not present

## 2017-08-07 NOTE — Telephone Encounter (Signed)
I called [facility] and informed [name] of the message below.

## 2017-08-07 NOTE — Telephone Encounter (Signed)
Patient called to report that during her dialysis this am she had 2 episodes of severe diarrhea.  She had terrible abdominal pain during the remainder of the dialysis tx.  She had another episode of abdominal pain with diarrhea and now the leg weakness has started again.  She feels very weak and is still having severe cramping.  She states that the dicyclomine is not helping.  She reports that this is still happening about 2 times a week.  She can't relate it to any activity, meals, or to her dialysis.  She is advised due to the lateness of the day she should go to the ED.  I scheduled her an office visit for Monday at 3:00 with Alonza Bogus, PA.  Daughter stated if this does not resolve quickly she will be taking her mother to the ED.

## 2017-08-10 ENCOUNTER — Ambulatory Visit (INDEPENDENT_AMBULATORY_CARE_PROVIDER_SITE_OTHER): Payer: Medicare Other | Admitting: Gastroenterology

## 2017-08-10 ENCOUNTER — Telehealth: Payer: Self-pay | Admitting: Family Medicine

## 2017-08-10 ENCOUNTER — Other Ambulatory Visit (INDEPENDENT_AMBULATORY_CARE_PROVIDER_SITE_OTHER): Payer: Self-pay | Admitting: Vascular Surgery

## 2017-08-10 ENCOUNTER — Encounter: Payer: Self-pay | Admitting: Gastroenterology

## 2017-08-10 VITALS — BP 140/60 | HR 80 | Ht 61.0 in | Wt 159.0 lb

## 2017-08-10 DIAGNOSIS — I132 Hypertensive heart and chronic kidney disease with heart failure and with stage 5 chronic kidney disease, or end stage renal disease: Secondary | ICD-10-CM | POA: Diagnosis not present

## 2017-08-10 DIAGNOSIS — J441 Chronic obstructive pulmonary disease with (acute) exacerbation: Secondary | ICD-10-CM | POA: Diagnosis not present

## 2017-08-10 DIAGNOSIS — Z992 Dependence on renal dialysis: Secondary | ICD-10-CM | POA: Diagnosis not present

## 2017-08-10 DIAGNOSIS — D631 Anemia in chronic kidney disease: Secondary | ICD-10-CM | POA: Diagnosis not present

## 2017-08-10 DIAGNOSIS — R1032 Left lower quadrant pain: Secondary | ICD-10-CM | POA: Diagnosis not present

## 2017-08-10 DIAGNOSIS — J9621 Acute and chronic respiratory failure with hypoxia: Secondary | ICD-10-CM | POA: Diagnosis not present

## 2017-08-10 DIAGNOSIS — N2581 Secondary hyperparathyroidism of renal origin: Secondary | ICD-10-CM | POA: Diagnosis not present

## 2017-08-10 DIAGNOSIS — I2510 Atherosclerotic heart disease of native coronary artery without angina pectoris: Secondary | ICD-10-CM | POA: Diagnosis not present

## 2017-08-10 DIAGNOSIS — N186 End stage renal disease: Secondary | ICD-10-CM | POA: Diagnosis not present

## 2017-08-10 DIAGNOSIS — I509 Heart failure, unspecified: Secondary | ICD-10-CM | POA: Diagnosis not present

## 2017-08-10 MED ORDER — DICYCLOMINE HCL 10 MG PO CAPS: 10.0000 mg | ORAL_CAPSULE | Freq: Three times a day (TID) | ORAL | 1 refills | Status: DC

## 2017-08-10 MED ORDER — CIPROFLOXACIN HCL 250 MG PO TABS: 250.0000 mg | ORAL_TABLET | Freq: Two times a day (BID) | ORAL | 0 refills | Status: DC

## 2017-08-10 MED ORDER — METRONIDAZOLE 500 MG PO TABS: 500.0000 mg | ORAL_TABLET | Freq: Three times a day (TID) | ORAL | 0 refills | Status: AC

## 2017-08-10 NOTE — Telephone Encounter (Signed)
Copied from [place]. Topic: General - Other >> Aug 10, 2017  2:56 PM Cecelia Byars, NT wrote: Reason for CRM: [facility] and wanted verbal orders for home health occupational health care  1 time a week for 6 weeks  please call [phone]

## 2017-08-10 NOTE — Progress Notes (Signed)
08/10/2017 [name] [id number] December 15, 1940   HISTORY OF PRESENT ILLNESS: This is a 77 year old female who is known to Dr. Fuller Plan.  She was just seen here 1 month ago for complaints of diarrhea and left lower quadrant abdominal pain.  She is somewhat of a difficult historian.  Anyway, CT scan of the abdomen and pelvis with contrast was ordered and showed the following:  IMPRESSION: 1. Extensive colonic diverticulosis, greatest distally where there is some wall thickening in the sigmoid colon. No surrounding inflammation to suggest active diverticulitis. No evidence of bowel perforation or obstruction. 2. Indeterminate lesion centrally in the left kidney has enlarged from previous noncontrast study of 2016. Although incompletely characterized by this study, this was felt to be a cyst on prior ultrasound. Consider dedicated renal CT (without and with contrast) to exclude subtle enhancement. 3. Extensive Aortic Atherosclerosis (ICD10-I70.0). 4. Ventral hernia containing only fat.  She was given Bentyl 10 mg take 3 times daily and was told to use Imodium.  She has been doing that, but still has episodes of severe left lower quadrant abdominal pain a few times per week.  She says that even in between the episodes of severe pain it is very sore.  She says that when the severe pain comes it lasts anywhere from 1-4 hours and causes her to feel very weak.  She underwent colonoscopy in March 2017 showing 5 colon polyps and severe diverticulosis.     Past Medical History:  Diagnosis Date  . Abnormality of gait 03/28/2015  . Adrenal mass ([place])   . 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 Arthritis   . Back pain   . CHF (congestive heart failure) ([place])   . Chronic kidney disease    Hemo MWF  . Congestion of throat    Pt states she has a lot mucus in back throat.  . Constipation   . COPD (chronic obstructive pulmonary disease) ([place])   . Depression   .  Dialysis patient [facility])    kidney  . Diverticulitis   . GERD (gastroesophageal reflux disease)   . H/O hiatal hernia   . High cholesterol   . Hoarseness of voice   . Hyperlipidemia   . Hypertension   . IBS (irritable bowel syndrome)   . Meralgia paresthetica of right side 12/26/2014  . Normal cardiac stress test 12/24/2009   lexiscan, imaging normal  . Renal disorder   . RLS (restless legs syndrome)   . Seizures ([place])    2004 past brain surgery  . Sinus complaint   . Thyroid disease   . TIA (transient ischemic attack)   . Tubular adenoma of colon 01/2008   Past Surgical History:  Procedure Laterality Date  . ABDOMINAL HYSTERECTOMY    . ANAL RECTAL MANOMETRY N/A 07/25/2015   Procedure: ANO RECTAL MANOMETRY;  Surgeon: Mauri Pole, MD;  Location: [facility];  Service: Endoscopy;  Laterality: N/A;  . APPENDECTOMY    . AV FISTULA PLACEMENT Left 11/11/2012   Procedure: INSERTION OF ARTERIOVENOUS (AV) GORE-TEX GRAFT ARM;  Surgeon: Angelia Mould, MD;  Location: [place];  Service: Vascular;  Laterality: Left;  . [place] REMOVAL Left 11/18/2012   Procedure: REMOVAL OF LEFT UPPER ARM ARTERIOVENOUS GORETEX GRAFT ([place]);  Surgeon: Angelia Mould, MD;  Location: [place];  Service: Vascular;  Laterality: Left;  . CARDIAC CATHETERIZATION  2003   normal  . CHOLECYSTECTOMY  Open mid line incision  . frontal craniotomy  2002   indication = sinusitis  . INSERTION OF DIALYSIS CATHETER     Left  . PATCH ANGIOPLASTY Left 11/18/2012   Procedure: PATCH ANGIOPLASTY;  Surgeon: Angelia Mould, MD;  Location: [place];  Service: Vascular;  Laterality: Left;  . PERIPHERAL VASCULAR CATHETERIZATION Left 03/08/2015   Procedure: A/V Shuntogram/Fistulagram;  Surgeon: Algernon Huxley, MD;  Location: [place] [facility];  Service: Cardiovascular;  Laterality: Left;  . PERIPHERAL VASCULAR CATHETERIZATION Left 03/08/2015   Procedure: A/V Shunt Intervention;  Surgeon: Algernon Huxley, MD;  Location: [place] [facility];  Service: Cardiovascular;  Laterality: Left;  . RECTAL ULTRASOUND N/A 10/05/2015   Procedure: ANAL ULTRASOUND WITH PROBE;  Surgeon: Leighton Ruff, MD;  Location: [facility];  Service: Endoscopy;  Laterality: N/A;  . REVISON OF ARTERIOVENOUS FISTULA  05/11/2012   Procedure: REVISON OF ARTERIOVENOUS FISTULA;  Surgeon: Elam Dutch, MD;  Location: [place];  Service: Vascular;  Laterality: Right;  . TUBAL LIGATION      reports that she has been smoking cigarettes.  She has a 60.00 pack-year smoking history. she has never used smokeless tobacco. She reports that she does not drink alcohol or use drugs. family history includes Breast cancer in her sister; Heart attack in her father; Heart disease in her father; Hypertension in her father; Kidney disease in her sister; Lupus in her daughter; Thyroid cancer in her mother and sister. Allergies  Allergen Reactions  . Tuberculin Tests Hives    "blisters"  . Valacyclovir Other (See Comments)    Confusion and nervousness  . Codeine Nausea And Vomiting  . Penicillins Rash    No problems breathing. Has tolerated omnicef in past without issue Has patient had a PCN reaction causing immediate rash, facial/tongue/throat swelling, SOB or lightheadedness with hypotension: Yes Has patient had a PCN reaction causing severe rash involving mucus membranes or skin necrosis: No Has patient had a PCN reaction that required hospitalization No Has patient had a PCN reaction occurring within the last 10 years: No If all of the above answers are "NO", then may proceed with Cephalosporin  . Sulfamethoxazole Rash      Outpatient Encounter Medications as of 08/10/2017  Medication Sig  . albuterol (PROVENTIL HFA;VENTOLIN HFA) 108 (90 Base) MCG/ACT inhaler Inhale 2 puffs into the lungs every 4 (four) hours as needed for wheezing or shortness of breath.  . allopurinol (ZYLOPRIM) 100 MG tablet Take 100 mg by mouth once daily on non-dialysis days  (Tues/Thurs/Sat/Sun) (Patient taking differently: Take 100 mg by mouth See admin instructions. Take one tablet (100 mg) by mouth once daily on non-dialysis days (Tues/Thurs/Sat/Sun))  . amLODipine (NORVASC) 10 MG tablet TAKE 1 TABLET BY MOUTH AT BEDTIME (Patient taking differently: TAKE 1 TABLET (10 MG) BY MOUTH AT BEDTIME)  . aspirin EC 81 MG tablet Take 81 mg by mouth daily.   Marland Kitchen atorvastatin (LIPITOR) 40 MG tablet Take 1 tablet (40 mg total) by mouth daily. (Patient taking differently: Take 40 mg by mouth at bedtime. )  . azelastine (ASTELIN) 0.1 % nasal spray Place 1 spray into both nostrils 2 (two) times daily. Use in each nostril as directed  . budesonide-formoterol (SYMBICORT) 160-4.5 MCG/ACT inhaler Inhale 2 puffs into the lungs 2 (two) times daily.  . cinacalcet (SENSIPAR) 90 MG tablet Take 90 mg by mouth at bedtime.  . cloNIDine (CATAPRES) 0.3 MG tablet Take 1 tablet (0.3 mg total) by mouth 3 (three) times  daily. (Patient taking differently: Take 0.3 mg by mouth 2 (two) times daily. )  . dicyclomine (BENTYL) 10 MG capsule Take 1 capsule (10 mg total) by mouth 4 (four) times daily -  before meals and at bedtime.  . DULoxetine (CYMBALTA) 30 MG capsule Take 2 capsules (60 mg total) by mouth daily. (Patient taking differently: Take 30 mg by mouth at bedtime. )  . fluticasone (FLONASE) 50 MCG/ACT nasal spray Place 2 sprays into both nostrils daily as needed for allergies or rhinitis.  Marland Kitchen lidocaine-prilocaine (EMLA) cream Apply 1 application topically See admin instructions. Apply topically one hour before dialysis - Monday, Wednesday, Friday  . loratadine (CLARITIN) 10 MG tablet 10 mg every other day.  . losartan (COZAAR) 100 MG tablet Take 1 tablet (100 mg total) by mouth daily.  . metoprolol succinate (TOPROL-XL) 100 MG 24 hr tablet TAKE 1 TABLET BY MOUTH TWICE DAILY (Patient taking differently: TAKE 1 TABLET (100 MG)  BY MOUTH TWICE DAILY)  . multivitamin (RENA-VIT) TABS tablet Take 1 tablet by  mouth at bedtime. (Patient taking differently: Take 1 tablet by mouth daily. )  . omeprazole (PRILOSEC) 40 MG capsule Take 1 capsule (40 mg total) by mouth 2 (two) times daily.  . pregabalin (LYRICA) 100 MG capsule Take 100 mg by mouth at bedtime.   . sevelamer carbonate (RENVELA) 800 MG tablet Take 2,400 mg by mouth See admin instructions. Take 3 tablets (2400 mg) by mouth with each meal or snack - once or twice daily  . [DISCONTINUED] dicyclomine (BENTYL) 10 MG capsule Take 1 capsule (10 mg total) by mouth 3 (three) times daily.  . [DISCONTINUED] predniSONE (DELTASONE) 10 MG tablet Prednisone 40 mg po once daily for 3 days, then 30 mg po daily for 3 days, then 20 mg po once daily for 3 days and then 10 mg po once daily for 3 days.  . [DISCONTINUED] varenicline (CHANTIX) 0.5 MG tablet Take 1 tablet (0.5 mg total) by mouth daily.  . ciprofloxacin (CIPRO) 250 MG tablet Take 1 tablet (250 mg total) by mouth 2 (two) times daily.  . metroNIDAZOLE (FLAGYL) 500 MG tablet Take 1 tablet (500 mg total) by mouth 3 (three) times daily for 10 days.   No facility-administered encounter medications on file as of 08/10/2017.      REVIEW OF SYSTEMS  : All other systems reviewed and negative except where noted in the History of Present Illness.   PHYSICAL EXAM: BP 140/60   Pulse 80   Ht 5\' 1"  (1.549 m)   Wt 159 lb (72.1 kg)   BMI 30.04 kg/m  General: Well developed black female in no acute distress Head: Normocephalic and atraumatic Eyes:  Sclerae anicteric, conjunctiva pink. Ears: Normal auditory acuity Lungs: Clear throughout to auscultation; no increased WOB. Heart: Regular rate and rhythm; no M/R/G. Abdomen: Soft, non-distended.  BS present.  Moderate LLQ TTP. Musculoskeletal: Symmetrical with no gross deformities  Skin: No lesions on visible extremities Extremities: No edema  Neurological: Alert oriented x 4, grossly non-focal Psychological:  Alert and cooperative. Normal mood and  affect  ASSESSMENT AND PLAN: *LLQ abdominal pain:  Was just seen here one month ago with the same complaints.  No improvement on Bentyl.  CT scan did not show diverticulitis but she is very tender and describes pain as being severe.   ? If she could have some low-grade inflammation.  Will treat empirically for diverticulitis with cipro 250 mg BID and flagyl 500 mg TID for 10 days.  Can continue to use the Bentyl up to four times daily as well; will refill.  Will call back in 10-14 days with an update on her symptoms.   CC:  Martinique, Betty G, MD

## 2017-08-10 NOTE — Telephone Encounter (Signed)
Spoke with [facility], gave verbal orders per Dr. Martinique.

## 2017-08-10 NOTE — Patient Instructions (Signed)
We sent prescriptions to your pharmacy, [facility]. 1. Flagyl ( metronidazole)  500 mg 2. Cipro 250 mg 3. Bentyl ( Dicyclomine) 10mg  4 times daily, with meals and at bedtime.  Call us back in 10-14 days with an update on how you are doing. Call [phone], choose option 2 and ask for Patty, Jessica's nurse.   If you are age 76 or older, your body mass index should be between 23-30. Your Body mass index is 30.04 kg/m. If this is out of the aforementioned range listed, please consider follow up with your Primary Care Provider.

## 2017-08-11 ENCOUNTER — Telehealth: Payer: Self-pay | Admitting: Gastroenterology

## 2017-08-11 DIAGNOSIS — J441 Chronic obstructive pulmonary disease with (acute) exacerbation: Secondary | ICD-10-CM | POA: Diagnosis not present

## 2017-08-11 DIAGNOSIS — N3946 Mixed incontinence: Secondary | ICD-10-CM | POA: Diagnosis not present

## 2017-08-11 DIAGNOSIS — I132 Hypertensive heart and chronic kidney disease with heart failure and with stage 5 chronic kidney disease, or end stage renal disease: Secondary | ICD-10-CM | POA: Diagnosis not present

## 2017-08-11 DIAGNOSIS — J9621 Acute and chronic respiratory failure with hypoxia: Secondary | ICD-10-CM | POA: Diagnosis not present

## 2017-08-11 DIAGNOSIS — I2510 Atherosclerotic heart disease of native coronary artery without angina pectoris: Secondary | ICD-10-CM | POA: Diagnosis not present

## 2017-08-11 DIAGNOSIS — N186 End stage renal disease: Secondary | ICD-10-CM | POA: Diagnosis not present

## 2017-08-11 DIAGNOSIS — N281 Cyst of kidney, acquired: Secondary | ICD-10-CM | POA: Diagnosis not present

## 2017-08-11 DIAGNOSIS — I509 Heart failure, unspecified: Secondary | ICD-10-CM | POA: Diagnosis not present

## 2017-08-11 NOTE — Telephone Encounter (Signed)
Spoke with pt and let her know that most any antibiotic can cause a yeast infection. Pt states she will call back if she has any problems.

## 2017-08-12 DIAGNOSIS — D631 Anemia in chronic kidney disease: Secondary | ICD-10-CM | POA: Diagnosis not present

## 2017-08-12 DIAGNOSIS — N186 End stage renal disease: Secondary | ICD-10-CM | POA: Diagnosis not present

## 2017-08-12 DIAGNOSIS — Z992 Dependence on renal dialysis: Secondary | ICD-10-CM | POA: Diagnosis not present

## 2017-08-12 DIAGNOSIS — N2581 Secondary hyperparathyroidism of renal origin: Secondary | ICD-10-CM | POA: Diagnosis not present

## 2017-08-13 ENCOUNTER — Encounter
Admission: RE | Disposition: A | Discharge status: Home / Self Care | Payer: Self-pay | Source: Ambulatory Visit | Attending: Vascular Surgery

## 2017-08-13 ENCOUNTER — Ambulatory Visit
Admission: RE | Admit: 2017-08-13 | Discharge: 2017-08-13 | Disposition: A | Discharge status: Home / Self Care | Payer: Medicare Other | Source: Ambulatory Visit | Attending: Vascular Surgery | Admitting: Vascular Surgery

## 2017-08-13 DIAGNOSIS — J449 Chronic obstructive pulmonary disease, unspecified: Secondary | ICD-10-CM | POA: Insufficient documentation

## 2017-08-13 DIAGNOSIS — Y832 Surgical operation with anastomosis, bypass or graft as the cause of abnormal reaction of the patient, or of later complication, without mention of misadventure at the time of the procedure: Secondary | ICD-10-CM | POA: Insufficient documentation

## 2017-08-13 DIAGNOSIS — I132 Hypertensive heart and chronic kidney disease with heart failure and with stage 5 chronic kidney disease, or end stage renal disease: Secondary | ICD-10-CM | POA: Insufficient documentation

## 2017-08-13 DIAGNOSIS — T82858A Stenosis of vascular prosthetic devices, implants and grafts, initial encounter: Secondary | ICD-10-CM | POA: Diagnosis not present

## 2017-08-13 DIAGNOSIS — N186 End stage renal disease: Secondary | ICD-10-CM

## 2017-08-13 DIAGNOSIS — Z79899 Other long term (current) drug therapy: Secondary | ICD-10-CM | POA: Insufficient documentation

## 2017-08-13 DIAGNOSIS — Z7952 Long term (current) use of systemic steroids: Secondary | ICD-10-CM | POA: Diagnosis not present

## 2017-08-13 DIAGNOSIS — F329 Major depressive disorder, single episode, unspecified: Secondary | ICD-10-CM | POA: Diagnosis not present

## 2017-08-13 DIAGNOSIS — Z992 Dependence on renal dialysis: Secondary | ICD-10-CM | POA: Diagnosis not present

## 2017-08-13 DIAGNOSIS — Z880 Allergy status to penicillin: Secondary | ICD-10-CM | POA: Diagnosis not present

## 2017-08-13 DIAGNOSIS — Z882 Allergy status to sulfonamides status: Secondary | ICD-10-CM | POA: Diagnosis not present

## 2017-08-13 DIAGNOSIS — Z7982 Long term (current) use of aspirin: Secondary | ICD-10-CM | POA: Insufficient documentation

## 2017-08-13 DIAGNOSIS — E785 Hyperlipidemia, unspecified: Secondary | ICD-10-CM | POA: Diagnosis not present

## 2017-08-13 DIAGNOSIS — Z885 Allergy status to narcotic agent status: Secondary | ICD-10-CM | POA: Insufficient documentation

## 2017-08-13 DIAGNOSIS — F17210 Nicotine dependence, cigarettes, uncomplicated: Secondary | ICD-10-CM | POA: Diagnosis not present

## 2017-08-13 DIAGNOSIS — G2581 Restless legs syndrome: Secondary | ICD-10-CM | POA: Diagnosis not present

## 2017-08-13 DIAGNOSIS — K219 Gastro-esophageal reflux disease without esophagitis: Secondary | ICD-10-CM | POA: Insufficient documentation

## 2017-08-13 DIAGNOSIS — Z7951 Long term (current) use of inhaled steroids: Secondary | ICD-10-CM | POA: Insufficient documentation

## 2017-08-13 DIAGNOSIS — T82868A Thrombosis of vascular prosthetic devices, implants and grafts, initial encounter: Secondary | ICD-10-CM | POA: Diagnosis not present

## 2017-08-13 DIAGNOSIS — I509 Heart failure, unspecified: Secondary | ICD-10-CM | POA: Insufficient documentation

## 2017-08-13 DIAGNOSIS — T8241XA Breakdown (mechanical) of vascular dialysis catheter, initial encounter: Secondary | ICD-10-CM | POA: Diagnosis present

## 2017-08-13 DIAGNOSIS — Z888 Allergy status to other drugs, medicaments and biological substances status: Secondary | ICD-10-CM | POA: Diagnosis not present

## 2017-08-13 HISTORY — PX: A/V SHUNTOGRAM: CATH118297

## 2017-08-13 HISTORY — PX: A/V SHUNT INTERVENTION: CATH118220

## 2017-08-13 LAB — POTASSIUM (ARMC VASCULAR LAB ONLY): Potassium (ARMC vascular lab): 4.5 (ref 3.5–5.1)

## 2017-08-13 SURGERY — A/V SHUNTOGRAM
Anesthesia: Moderate Sedation

## 2017-08-13 MED ORDER — MIDAZOLAM HCL 5 MG/5ML IJ SOLN
INTRAMUSCULAR | Status: AC
  Filled 2017-08-13: qty 5

## 2017-08-13 MED ORDER — SODIUM CHLORIDE 0.9 % IV SOLN
INTRAVENOUS | Status: DC
  Administered 2017-08-13: 08:00:00 via INTRAVENOUS

## 2017-08-13 MED ORDER — HEPARIN SODIUM (PORCINE) 1000 UNIT/ML IJ SOLN
INTRAMUSCULAR | Status: DC | PRN
  Administered 2017-08-13: 3000 [IU] via INTRAVENOUS

## 2017-08-13 MED ORDER — HYDROMORPHONE HCL 1 MG/ML IJ SOLN: 1.0000 mg | Freq: Once | INTRAMUSCULAR | Status: DC | PRN

## 2017-08-13 MED ORDER — MIDAZOLAM HCL 2 MG/2ML IJ SOLN
INTRAMUSCULAR | Status: DC | PRN
  Administered 2017-08-13: 2 mg via INTRAVENOUS

## 2017-08-13 MED ORDER — CLINDAMYCIN PHOSPHATE 300 MG/50ML IV SOLN
300.0000 mg | Freq: Once | INTRAVENOUS | Status: AC
  Administered 2017-08-13: 300 mg via INTRAVENOUS

## 2017-08-13 MED ORDER — FAMOTIDINE 20 MG PO TABS: 40.0000 mg | ORAL_TABLET | ORAL | Status: DC | PRN

## 2017-08-13 MED ORDER — HEPARIN SODIUM (PORCINE) 1000 UNIT/ML IJ SOLN
INTRAMUSCULAR | Status: AC
  Filled 2017-08-13: qty 1

## 2017-08-13 MED ORDER — ONDANSETRON HCL 4 MG/2ML IJ SOLN: 4.0000 mg | Freq: Four times a day (QID) | INTRAMUSCULAR | Status: DC | PRN

## 2017-08-13 MED ORDER — FENTANYL CITRATE (PF) 100 MCG/2ML IJ SOLN
INTRAMUSCULAR | Status: AC
  Filled 2017-08-13: qty 2

## 2017-08-13 MED ORDER — METHYLPREDNISOLONE SODIUM SUCC 125 MG IJ SOLR: 125.0000 mg | INTRAMUSCULAR | Status: DC | PRN

## 2017-08-13 MED ORDER — HEPARIN (PORCINE) IN NACL 2-0.9 UNIT/ML-% IJ SOLN
INTRAMUSCULAR | Status: AC
  Filled 2017-08-13: qty 1000

## 2017-08-13 MED ORDER — CLINDAMYCIN PHOSPHATE 300 MG/50ML IV SOLN
INTRAVENOUS | Status: AC
  Filled 2017-08-13: qty 50

## 2017-08-13 MED ORDER — LIDOCAINE-EPINEPHRINE (PF) 1 %-1:200000 IJ SOLN
INTRAMUSCULAR | Status: AC
  Filled 2017-08-13: qty 30

## 2017-08-13 MED ORDER — FENTANYL CITRATE (PF) 100 MCG/2ML IJ SOLN
INTRAMUSCULAR | Status: DC | PRN
  Administered 2017-08-13: 50 ug via INTRAVENOUS

## 2017-08-13 SURGICAL SUPPLY — 9 items
BALLN DORADO7X100X80 (BALLOONS) ×4
BALLOON DORADO7X100X80 (BALLOONS) IMPLANT
CANNULA 5F STIFF (CANNULA) ×2 IMPLANT
DEVICE PRESTO INFLATION (MISCELLANEOUS) ×2 IMPLANT
DRAPE BRACHIAL (DRAPES) ×3 IMPLANT
PACK ANGIOGRAPHY (CUSTOM PROCEDURE TRAY) ×4 IMPLANT
SHEATH BRITE TIP 6FRX5.5 (SHEATH) ×3 IMPLANT
SUT MNCRL AB 4-0 PS2 18 (SUTURE) ×3 IMPLANT
WIRE MAGIC TOR.035 180C (WIRE) ×2 IMPLANT

## 2017-08-13 NOTE — H&P (Signed)
[facility] History & Physical Update  The patient was interviewed and re-examined.  The patient's previous History and Physical has been reviewed and is unchanged.  There is no change in the plan of care. We plan to proceed with the scheduled procedure.  Leotis Pain, MD  08/13/2017, 8:31 AM

## 2017-08-13 NOTE — Op Note (Signed)
[facility]    OPERATIVE NOTE   PROCEDURE: 1.  Left HeRO graft cannulation under ultrasound guidance 2.  Left arm shuntogram 3.  Percutaneous transluminal angioplasty of the mid to distal PTFE portion of the graft with 7 mm diameter by 10 cm length high-pressure angioplasty balloon  PRE-OPERATIVE DIAGNOSIS: 1. ESRD 2. Malfunctioning left arm HeRO graft  POST-OPERATIVE DIAGNOSIS: same as above   SURGEON: Leotis Pain, MD  ANESTHESIA: local with MCS  ESTIMATED BLOOD LOSS: 5 cc  FINDING(S): 1. 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  Arterial anastomosis is widely patent.  Central venous portion was widely patent.  SPECIMEN(S):  None  CONTRAST: 15 cc  FLUORO TIME: 1 minute  MODERATE CONSCIOUS SEDATION TIME:  Approximately 15 minutes using 2 mg of Versed and 50 Mcg of Fentanyl  INDICATIONS: [name] is a 77 y.o. female who presents with malfunctioning left arm HeRO graft.  The patient is scheduled for left arm shuntogram.  The patient is aware the risks include but are not limited to: bleeding, infection, thrombosis of the cannulated access, and possible anaphylactic reaction to the contrast.  The patient is aware of the risks of the procedure and elects to proceed forward.  DESCRIPTION: After full informed written consent was obtained, the patient was brought back to the angiography suite and placed supine upon the angiography table.  The patient was connected to monitoring equipment. Moderate conscious sedation was administered during a face to face encounter throughout the procedure with my supervision of the RN administering medicines and monitoring the patient's vital signs, pulse oximetry, telemetry and mental status throughout from the start of the procedure until the patient was taken to the recovery room The left arm was prepped and draped in the standard fashion for a  percutaneous access intervention.  Under ultrasound guidance, the left arm HeRO graft was cannulated with a micropuncture needle under direct ultrasound guidance and a permanent image was performed.  The microwire was advanced into the graft and the needle was exchanged for the a microsheath.  I then upsized to a 6 Fr Sheath and imaging was performed.  Hand injections were completed to image the access including the central venous system. 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  Arterial anastomosis is widely patent.  Central venous portion was widely patent.  Based on the images, this patient will need intervention to this mid to distal graft stenosis. I then gave the patient 3000 units of intravenous heparin.  I then crossed the stenosis with a Magic Tourqe wire.  Based on the imaging, a 7 mm x 10 cm  High pressure angioplasty balloon was selected.  The balloon was centered around the stenosis and inflated to 14 ATM for 1 minute(s).  On completion imaging, a 15-20% residual stenosis was present.     Based on the completion imaging, no further intervention is necessary.  The wire and balloon were removed from the sheath.  A 4-0 Monocryl purse-string suture was sewn around the sheath.  The sheath was removed while tying down the suture.  A sterile bandage was applied to the puncture site.  COMPLICATIONS: None  CONDITION: Stable   Leotis Pain  08/13/2017 9:14 AM    This note was created with Dragon Medical transcription system. Any errors in dictation are purely unintentional.

## 2017-08-13 NOTE — Discharge Instructions (Signed)

## 2017-08-14 DIAGNOSIS — I509 Heart failure, unspecified: Secondary | ICD-10-CM | POA: Diagnosis not present

## 2017-08-14 DIAGNOSIS — Z992 Dependence on renal dialysis: Secondary | ICD-10-CM | POA: Diagnosis not present

## 2017-08-14 DIAGNOSIS — J9621 Acute and chronic respiratory failure with hypoxia: Secondary | ICD-10-CM | POA: Diagnosis not present

## 2017-08-14 DIAGNOSIS — N186 End stage renal disease: Secondary | ICD-10-CM | POA: Diagnosis not present

## 2017-08-14 DIAGNOSIS — N2581 Secondary hyperparathyroidism of renal origin: Secondary | ICD-10-CM | POA: Diagnosis not present

## 2017-08-14 DIAGNOSIS — D631 Anemia in chronic kidney disease: Secondary | ICD-10-CM | POA: Diagnosis not present

## 2017-08-14 DIAGNOSIS — I132 Hypertensive heart and chronic kidney disease with heart failure and with stage 5 chronic kidney disease, or end stage renal disease: Secondary | ICD-10-CM | POA: Diagnosis not present

## 2017-08-14 DIAGNOSIS — I2510 Atherosclerotic heart disease of native coronary artery without angina pectoris: Secondary | ICD-10-CM | POA: Diagnosis not present

## 2017-08-14 DIAGNOSIS — J441 Chronic obstructive pulmonary disease with (acute) exacerbation: Secondary | ICD-10-CM | POA: Diagnosis not present

## 2017-08-15 NOTE — Progress Notes (Signed)
Reviewed and agree with initial management plan. Urology referral was recommended for further evaluation of enlarging renal lesion   [name] T. Fuller Plan, MD [facility]

## 2017-08-17 DIAGNOSIS — Z992 Dependence on renal dialysis: Secondary | ICD-10-CM | POA: Diagnosis not present

## 2017-08-17 DIAGNOSIS — N186 End stage renal disease: Secondary | ICD-10-CM | POA: Diagnosis not present

## 2017-08-17 DIAGNOSIS — N2581 Secondary hyperparathyroidism of renal origin: Secondary | ICD-10-CM | POA: Diagnosis not present

## 2017-08-17 DIAGNOSIS — D631 Anemia in chronic kidney disease: Secondary | ICD-10-CM | POA: Diagnosis not present

## 2017-08-18 DIAGNOSIS — I2510 Atherosclerotic heart disease of native coronary artery without angina pectoris: Secondary | ICD-10-CM | POA: Diagnosis not present

## 2017-08-18 DIAGNOSIS — N2889 Other specified disorders of kidney and ureter: Secondary | ICD-10-CM | POA: Diagnosis not present

## 2017-08-18 DIAGNOSIS — I132 Hypertensive heart and chronic kidney disease with heart failure and with stage 5 chronic kidney disease, or end stage renal disease: Secondary | ICD-10-CM | POA: Diagnosis not present

## 2017-08-18 DIAGNOSIS — N186 End stage renal disease: Secondary | ICD-10-CM | POA: Diagnosis not present

## 2017-08-18 DIAGNOSIS — I509 Heart failure, unspecified: Secondary | ICD-10-CM | POA: Diagnosis not present

## 2017-08-18 DIAGNOSIS — N281 Cyst of kidney, acquired: Secondary | ICD-10-CM | POA: Diagnosis not present

## 2017-08-18 DIAGNOSIS — J9621 Acute and chronic respiratory failure with hypoxia: Secondary | ICD-10-CM | POA: Diagnosis not present

## 2017-08-18 DIAGNOSIS — J441 Chronic obstructive pulmonary disease with (acute) exacerbation: Secondary | ICD-10-CM | POA: Diagnosis not present

## 2017-08-19 ENCOUNTER — Telehealth: Payer: Self-pay | Admitting: Gastroenterology

## 2017-08-19 DIAGNOSIS — Z992 Dependence on renal dialysis: Secondary | ICD-10-CM | POA: Diagnosis not present

## 2017-08-19 DIAGNOSIS — N2581 Secondary hyperparathyroidism of renal origin: Secondary | ICD-10-CM | POA: Diagnosis not present

## 2017-08-19 DIAGNOSIS — N186 End stage renal disease: Secondary | ICD-10-CM | POA: Diagnosis not present

## 2017-08-19 DIAGNOSIS — D631 Anemia in chronic kidney disease: Secondary | ICD-10-CM | POA: Diagnosis not present

## 2017-08-19 NOTE — Telephone Encounter (Signed)
The pt states she is taking imodium and bentyl as prescribed.  I have asked her to continue her meds and force fluids to prevent dehydration and call back if no better on monday. She may have a virus, she has been around sick contacts.  She agreed

## 2017-08-20 ENCOUNTER — Emergency Department (HOSPITAL_COMMUNITY): Payer: Medicare Other

## 2017-08-20 ENCOUNTER — Emergency Department (HOSPITAL_COMMUNITY)
Admission: EM | Admit: 2017-08-20 | Discharge: 2017-08-21 | Disposition: A | Discharge status: Home / Self Care | Payer: Medicare Other | Attending: Emergency Medicine | Admitting: Emergency Medicine

## 2017-08-20 ENCOUNTER — Other Ambulatory Visit: Payer: Self-pay

## 2017-08-20 ENCOUNTER — Encounter (HOSPITAL_COMMUNITY): Payer: Self-pay | Admitting: *Deleted

## 2017-08-20 DIAGNOSIS — R531 Weakness: Secondary | ICD-10-CM | POA: Diagnosis not present

## 2017-08-20 DIAGNOSIS — I5022 Chronic systolic (congestive) heart failure: Secondary | ICD-10-CM | POA: Insufficient documentation

## 2017-08-20 DIAGNOSIS — I132 Hypertensive heart and chronic kidney disease with heart failure and with stage 5 chronic kidney disease, or end stage renal disease: Secondary | ICD-10-CM | POA: Insufficient documentation

## 2017-08-20 DIAGNOSIS — N186 End stage renal disease: Secondary | ICD-10-CM | POA: Insufficient documentation

## 2017-08-20 DIAGNOSIS — Z992 Dependence on renal dialysis: Secondary | ICD-10-CM | POA: Diagnosis not present

## 2017-08-20 DIAGNOSIS — R079 Chest pain, unspecified: Secondary | ICD-10-CM | POA: Insufficient documentation

## 2017-08-20 DIAGNOSIS — R110 Nausea: Secondary | ICD-10-CM | POA: Diagnosis not present

## 2017-08-20 DIAGNOSIS — F17210 Nicotine dependence, cigarettes, uncomplicated: Secondary | ICD-10-CM | POA: Insufficient documentation

## 2017-08-20 DIAGNOSIS — Z79899 Other long term (current) drug therapy: Secondary | ICD-10-CM | POA: Diagnosis not present

## 2017-08-20 DIAGNOSIS — J449 Chronic obstructive pulmonary disease, unspecified: Secondary | ICD-10-CM | POA: Diagnosis not present

## 2017-08-20 DIAGNOSIS — R1013 Epigastric pain: Secondary | ICD-10-CM | POA: Diagnosis not present

## 2017-08-20 DIAGNOSIS — R52 Pain, unspecified: Secondary | ICD-10-CM | POA: Diagnosis not present

## 2017-08-20 DIAGNOSIS — R0600 Dyspnea, unspecified: Secondary | ICD-10-CM | POA: Diagnosis not present

## 2017-08-20 DIAGNOSIS — M549 Dorsalgia, unspecified: Secondary | ICD-10-CM | POA: Diagnosis present

## 2017-08-20 DIAGNOSIS — R0789 Other chest pain: Secondary | ICD-10-CM | POA: Insufficient documentation

## 2017-08-20 DIAGNOSIS — Z7982 Long term (current) use of aspirin: Secondary | ICD-10-CM | POA: Insufficient documentation

## 2017-08-20 DIAGNOSIS — E079 Disorder of thyroid, unspecified: Secondary | ICD-10-CM | POA: Insufficient documentation

## 2017-08-20 DIAGNOSIS — M5489 Other dorsalgia: Secondary | ICD-10-CM | POA: Diagnosis not present

## 2017-08-20 DIAGNOSIS — K5730 Diverticulosis of large intestine without perforation or abscess without bleeding: Secondary | ICD-10-CM | POA: Diagnosis not present

## 2017-08-20 LAB — CBC
HEMATOCRIT: 43.2 % (ref 36.0–46.0)
Hemoglobin: 14 g/dL (ref 12.0–15.0)
MCH: 30.5 pg (ref 26.0–34.0)
MCHC: 32.4 g/dL (ref 30.0–36.0)
MCV: 94.1 fL (ref 78.0–100.0)
PLATELETS: 200 10*3/uL (ref 150–400)
RBC: 4.59 MIL/uL (ref 3.87–5.11)
RDW: 18.6 % — AB (ref 11.5–15.5)
WBC: 12.4 10*3/uL — ABNORMAL HIGH (ref 4.0–10.5)

## 2017-08-20 LAB — HEPATIC FUNCTION PANEL
ALK PHOS: 72 U/L (ref 38–126)
ALT: 13 U/L — AB (ref 14–54)
AST: 21 U/L (ref 15–41)
Albumin: 3.1 g/dL — ABNORMAL LOW (ref 3.5–5.0)
BILIRUBIN TOTAL: 0.5 mg/dL (ref 0.3–1.2)
Total Protein: 7.3 g/dL (ref 6.5–8.1)

## 2017-08-20 LAB — BASIC METABOLIC PANEL
Anion gap: 16 — ABNORMAL HIGH (ref 5–15)
BUN: 16 mg/dL (ref 6–20)
CHLORIDE: 93 mmol/L — AB (ref 101–111)
CO2: 26 mmol/L (ref 22–32)
CREATININE: 7.13 mg/dL — AB (ref 0.44–1.00)
Calcium: 8.5 mg/dL — ABNORMAL LOW (ref 8.9–10.3)
GFR calc Af Amer: 6 mL/min — ABNORMAL LOW (ref >60)
GFR calc non Af Amer: 5 mL/min — ABNORMAL LOW (ref >60)
GLUCOSE: 93 mg/dL (ref 65–99)
POTASSIUM: 4.7 mmol/L (ref 3.5–5.1)
Sodium: 135 mmol/L (ref 135–145)

## 2017-08-20 LAB — I-STAT TROPONIN, ED: Troponin i, poc: 0.01 ng/mL (ref 0.00–0.08)

## 2017-08-20 LAB — LIPASE, BLOOD: LIPASE: 22 U/L (ref 11–51)

## 2017-08-20 MED ORDER — BLISTEX MEDICATED EX OINT
TOPICAL_OINTMENT | CUTANEOUS | Status: DC | PRN
Start: 2017-08-20 — End: 2017-08-21
  Filled 2017-08-20: qty 6.3

## 2017-08-20 MED ORDER — SODIUM CHLORIDE 0.9 % IV BOLUS (SEPSIS)
500.0000 mL | Freq: Once | INTRAVENOUS | Status: AC
  Administered 2017-08-20: 500 mL via INTRAVENOUS

## 2017-08-20 MED ORDER — MORPHINE SULFATE (PF) 4 MG/ML IV SOLN
4.0000 mg | Freq: Once | INTRAVENOUS | Status: AC
  Administered 2017-08-20: 4 mg via INTRAMUSCULAR
  Filled 2017-08-20: qty 1

## 2017-08-20 MED ORDER — FAMOTIDINE IN NACL 20-0.9 MG/50ML-% IV SOLN
20.0000 mg | Freq: Once | INTRAVENOUS | Status: AC
  Administered 2017-08-20: 20 mg via INTRAVENOUS
  Filled 2017-08-20: qty 50

## 2017-08-20 MED ORDER — GI COCKTAIL ~~LOC~~
30.0000 mL | Freq: Once | ORAL | Status: AC
  Administered 2017-08-20: 30 mL via ORAL
  Filled 2017-08-20: qty 30

## 2017-08-20 MED ORDER — IOPAMIDOL (ISOVUE-370) INJECTION 76%
INTRAVENOUS | Status: AC
  Filled 2017-08-20: qty 100

## 2017-08-20 MED ORDER — FAMOTIDINE 20 MG PO TABS: 20.0000 mg | ORAL_TABLET | Freq: Two times a day (BID) | ORAL | 0 refills | Status: DC

## 2017-08-20 MED ORDER — IOPAMIDOL (ISOVUE-300) INJECTION 61%
INTRAVENOUS | Status: AC
  Filled 2017-08-20: qty 30

## 2017-08-20 MED ORDER — FENTANYL CITRATE (PF) 100 MCG/2ML IJ SOLN
25.0000 ug | Freq: Once | INTRAMUSCULAR | Status: AC
Start: 2017-08-20 — End: 2017-08-20
  Administered 2017-08-20: 25 ug via INTRAVENOUS
  Filled 2017-08-20: qty 2

## 2017-08-20 MED ORDER — WHITE PETROLATUM EX OINT: TOPICAL_OINTMENT | CUTANEOUS | Status: DC | PRN

## 2017-08-20 NOTE — Discharge Instructions (Signed)
As discussed, your evaluation today has been largely reassuring.  But, it is important that you monitor your condition carefully, and do not hesitate to return to the ED if you develop new, or concerning changes in your condition. ? ?Otherwise, please follow-up with your physician for appropriate ongoing care. ? ?

## 2017-08-20 NOTE — ED Notes (Signed)
Attempted to start IV with 24g in right hand-- unsuccessful-- IV that IVteam started in right upper arm-- not running- dr lockwood made aware.

## 2017-08-20 NOTE — ED Triage Notes (Signed)
Pt arrived by [facility] from home. Pt reports having severe chest pain/esophageal pain and back pain. States this is same as acid reflux pain she has had in past. Took two prilosec pta with no relief. Is dialysis pt.

## 2017-08-20 NOTE — ED Provider Notes (Signed)
[facility] EMERGENCY DEPARTMENT Provider Note   CSN: [id number] Arrival date & time: 08/20/17  0813     History   Chief Complaint Chief Complaint  Patient presents with  . Back Pain  . Chest Pain    HPI [name] is a 77 y.o. female.  HPI Patient presents with concern of chest, abdomen, back pain. Patient developed the pain about 3 hours prior to my evaluation, and since onset the pain is been persistent, sharp, severe, radiating anterior/posterior. There is some associated dyspnea, nausea, no syncope. She notes that over the past 3 days she has had persistent bowel irritability, multiple episodes of diarrhea.  When she has known diverticulitis, as well as multiple other medical issues including end-stage renal disease.  When she is scheduled for dialysis tomorrow. Patient does not smoke, does not drink. Since onset earlier today, no relief with anything, though she has not taken any medication. Symptoms are not positional, exertional, inspirational. She does have a history of hiatal hernia, notes that she takes Prilosec. Past Medical History:  Diagnosis Date  . Abnormality of gait 03/28/2015  . Adrenal mass ([place])   . 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 Arthritis   . Back pain   . CHF (congestive heart failure) ([place])   . Chronic kidney disease    Hemo MWF  . Congestion of throat    Pt states she has a lot mucus in back throat.  . Constipation   . COPD (chronic obstructive pulmonary disease) ([place])   . Depression   . Dialysis patient [facility])    kidney  . Diverticulitis   . GERD (gastroesophageal reflux disease)   . H/O hiatal hernia   . High cholesterol   . Hoarseness of voice   . Hyperlipidemia   . Hypertension   . IBS (irritable bowel syndrome)   . Meralgia paresthetica of right side 12/26/2014  . Normal cardiac stress test 12/24/2009   lexiscan, imaging normal  . Renal disorder   . RLS (restless  legs syndrome)   . Seizures ([place])    2004 past brain surgery  . Sinus complaint   . Thyroid disease   . TIA (transient ischemic attack)   . Tubular adenoma of colon 01/2008    Patient Active Problem List   Diagnosis Date Noted  . LLQ abdominal pain 08/10/2017  . Left bundle branch block 07/24/2017  . Chest pain 07/24/2017  . Weakness 12/23/2016  . Generalized weakness 12/22/2016  . Elevated troponin 12/06/2016  . HCAP (healthcare-associated pneumonia) 12/06/2016  . Hypokalemia 12/06/2016  . Chronic systolic CHF (congestive heart failure) ([place]) 12/06/2016  . Osteopenia 09/08/2016  . Allergic rhinitis 08/07/2016  . BMI 32.0-32.9,adult 08/07/2016  . Onychomycosis 05/09/2016  . Nonspecific chest pain 04/19/2016  . ACS (acute coronary syndrome) ([place]) 04/19/2016  . Leg pain, bilateral 12/05/2015  . Trouble in sleeping 11/22/2015  . End stage renal disease ([place])   . Hyperkalemia 11/12/2015  . Acute on chronic respiratory failure with hypoxia ([place]) 11/12/2015  . ESRD on dialysis ([place])   . Chronic obstructive pulmonary disease ([place])   . Hypertensive urgency 09/20/2015  . Fecal incontinence   . Adverse effects of medication 06/21/2015  . Herpes 06/19/2015  . Skin lesion 06/16/2015  . Recurrent genital herpes 06/16/2015  . COPD exacerbation ([place]) 06/05/2015  . Onychocryptosis 04/08/2015  . Unstable gait 03/28/2015  . Renal mass   . Hypertension   .  Inadequate pain control 12/30/2014  . CHF (congestive heart failure) ([place]) 12/30/2014  . Meralgia paresthetica of right side 12/26/2014  . Hereditary and idiopathic peripheral neuropathy 02/15/2013  . Dermatitis of face 08/19/2012  . HIP PAIN, BILATERAL 12/07/2008  . BLADDER PROLAPSE 11/17/2007  . OSTEOARTHRITIS, GENERALIZED, MULTIPLE JOINTS 08/18/2007  . 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 Anxiety and depression 04/23/2007  . ANEMIA NEC 03/31/2007  . TOBACCO ABUSE 12/10/2006  . HIATAL HERNIA 12/10/2006  .  Dysphagia 12/10/2006  . HYPERCHOLESTEROLEMIA 07/30/2006  . Gout, unspecified 07/30/2006  . Major depression in partial remission ([place]) 07/30/2006  . Hypertensive renal disease, malignant, with renal failure 07/30/2006  . GASTROESOPHAGEAL REFLUX, NO ESOPHAGITIS 07/30/2006    Past Surgical History:  Procedure Laterality Date  . A/V SHUNT INTERVENTION N/A 08/13/2017   Procedure: A/V SHUNT INTERVENTION;  Surgeon: Algernon Huxley, MD;  Location: [place] CV LAB;  Service: Cardiovascular;  Laterality: N/A;  . A/V SHUNTOGRAM Left 08/13/2017   Procedure: A/V SHUNTOGRAM;  Surgeon: Algernon Huxley, MD;  Location: [facility] CV LAB;  Service: Cardiovascular;  Laterality: Left;  . ABDOMINAL HYSTERECTOMY    . ANAL RECTAL MANOMETRY N/A 07/25/2015   Procedure: ANO RECTAL MANOMETRY;  Surgeon: Mauri Pole, MD;  Location: [facility];  Service: Endoscopy;  Laterality: N/A;  . APPENDECTOMY    . AV FISTULA PLACEMENT Left 11/11/2012   Procedure: INSERTION OF ARTERIOVENOUS (AV) GORE-TEX GRAFT ARM;  Surgeon: Angelia Mould, MD;  Location: [facility];  Service: Vascular;  Laterality: Left;  . [place] REMOVAL Left 11/18/2012   Procedure: REMOVAL OF LEFT UPPER ARM ARTERIOVENOUS GORETEX GRAFT ([place]);  Surgeon: Angelia Mould, MD;  Location: [facility];  Service: Vascular;  Laterality: Left;  . CARDIAC CATHETERIZATION  2003   normal  . CHOLECYSTECTOMY     Open mid line incision  . frontal craniotomy  2002   indication = sinusitis  . INSERTION OF DIALYSIS CATHETER     Left  . PATCH ANGIOPLASTY Left 11/18/2012   Procedure: PATCH ANGIOPLASTY;  Surgeon: Angelia Mould, MD;  Location: [facility];  Service: Vascular;  Laterality: Left;  . PERIPHERAL VASCULAR CATHETERIZATION Left 03/08/2015   Procedure: A/V Shuntogram/Fistulagram;  Surgeon: Algernon Huxley, MD;  Location: [facility] CV LAB;  Service: Cardiovascular;  Laterality: Left;  . PERIPHERAL VASCULAR CATHETERIZATION Left 03/08/2015   Procedure: A/V Shunt  Intervention;  Surgeon: Algernon Huxley, MD;  Location: [facility];  Service: Cardiovascular;  Laterality: Left;  . RECTAL ULTRASOUND N/A 10/05/2015   Procedure: ANAL ULTRASOUND WITH PROBE;  Surgeon: Leighton Ruff, MD;  [facility];  Service: Endoscopy;  Laterality: N/A;  . REVISON OF ARTERIOVENOUS FISTULA  05/11/2012   Procedure: REVISON OF ARTERIOVENOUS FISTULA;  Surgeon: Elam Dutch, MD;  Location: [facility];  Service: Vascular;  Laterality: Right;  . TUBAL LIGATION      OB History    Gravida  5   Para  3   Term  3   Preterm      AB      Living        SAB      TAB      Ectopic      Multiple      Live Births               Home Medications    Prior to Admission medications   Medication Sig Start Date End Date Taking? Authorizing Provider  albuterol (PROVENTIL HFA;VENTOLIN  HFA) 108 (90 Base) MCG/ACT inhaler Inhale 2 puffs into the lungs every 4 (four) hours as needed for wheezing or shortness of breath. 03/11/17   Martinique, Betty G, MD  allopurinol (ZYLOPRIM) 100 MG tablet Take 100 mg by mouth once daily on non-dialysis days (Tues/Thurs/Sat/Sun) Patient taking differently: Take 100 mg by mouth See admin instructions. Take one tablet (100 mg) by mouth once daily on non-dialysis days (Tues/Thurs/Sat/Sun) 11/13/15   Smiley Houseman, MD  amLODipine (NORVASC) 10 MG tablet TAKE 1 TABLET BY MOUTH AT BEDTIME Patient taking differently: TAKE 1 TABLET (10 MG) BY MOUTH AT BEDTIME 06/01/17   Martinique, Betty G, MD  aspirin EC 81 MG tablet Take 81 mg by mouth daily.     [provider]  atorvastatin (LIPITOR) 40 MG tablet Take 1 tablet (40 mg total) by mouth daily. Patient taking differently: Take 40 mg by mouth at bedtime.  02/26/15   Rumley, Kilbourne N, DO  azelastine (ASTELIN) 0.1 % nasal spray Place 1 spray into both nostrils 2 (two) times daily. Use in each nostril as directed 08/07/16   Martinique, Betty G, MD  budesonide-formoterol [facility]) 160-4.5  MCG/ACT inhaler Inhale 2 puffs into the lungs 2 (two) times daily. 07/26/17   Dana Allan I, MD  cinacalcet (SENSIPAR) 90 MG tablet Take 90 mg by mouth at bedtime.    [provider]  ciprofloxacin (CIPRO) 250 MG tablet Take 1 tablet (250 mg total) by mouth 2 (two) times daily. 08/10/17   Zehr, Laban Emperor, PA-C  cloNIDine (CATAPRES) 0.3 MG tablet Take 1 tablet (0.3 mg total) by mouth 3 (three) times daily. Patient taking differently: Take 0.3 mg by mouth 2 (two) times daily.  07/26/17   Bonnell Public, MD  dicyclomine (BENTYL) 10 MG capsule Take 1 capsule (10 mg total) by mouth 4 (four) times daily -  before meals and at bedtime. 08/10/17   Zehr, Laban Emperor, PA-C  DULoxetine (CYMBALTA) 30 MG capsule Take 2 capsules (60 mg total) by mouth daily. Patient taking differently: Take 30 mg by mouth at bedtime.  01/08/17   Kathrynn Ducking, MD  fluticasone [facility]) 50 MCG/ACT nasal spray Place 2 sprays into both nostrils daily as needed for allergies or rhinitis. 05/08/16   Rogue Bussing, MD  lidocaine-prilocaine (EMLA) cream Apply 1 application topically See admin instructions. Apply topically one hour before dialysis - Monday, Wednesday, Friday    [provider]  loratadine (CLARITIN) 10 MG tablet 10 mg every other day. 08/04/17   Martinique, Betty G, MD  losartan (COZAAR) 100 MG tablet Take 1 tablet (100 mg total) by mouth daily. 11/12/16   Martinique, Betty G, MD  metoprolol succinate (TOPROL-XL) 100 MG 24 hr tablet TAKE 1 TABLET BY MOUTH TWICE DAILY Patient taking differently: TAKE 1 TABLET (100 MG)  BY MOUTH TWICE DAILY 03/27/17   Martinique, Betty G, MD  metroNIDAZOLE (FLAGYL) 500 MG tablet Take 1 tablet (500 mg total) by mouth 3 (three) times daily for 10 days. 08/10/17 08/20/17  Zehr, Laban Emperor, PA-C  multivitamin (RENA-VIT) TABS tablet Take 1 tablet by mouth at bedtime. Patient taking differently: Take 1 tablet by mouth daily.  01/01/15   Haney, Amedeo Plenty, MD  omeprazole (PRILOSEC)  40 MG capsule Take 1 capsule (40 mg total) by mouth 2 (two) times daily. 12/27/15   Ladene Artist, MD  pregabalin (LYRICA) 100 MG capsule Take 100 mg by mouth at bedtime.     [provider]  sevelamer carbonate (  RENVELA) 800 MG tablet Take 2,400 mg by mouth See admin instructions. Take 3 tablets (2400 mg) by mouth with each meal or snack - once or twice daily    [provider]    Family History Family History  Problem Relation Age of Onset  . Thyroid cancer Mother   . Heart disease Father   . Hypertension Father   . Heart attack Father   . Lupus Daughter   . Breast cancer Sister   . Thyroid cancer Sister   . Kidney disease Sister   . Colon cancer Neg Hx     Social History Social History   Tobacco Use  . Smoking status: Current Every Day Smoker    Packs/day: 1.00    Years: 60.00    Pack years: 60.00    Types: Cigarettes  . Smokeless tobacco: Never Used  . Tobacco comment: smoker since 77 yo.  1.5 ppd of [place] 100 lights. ; form given 10-23-16  Substance Use Topics  . Alcohol use: No    Alcohol/week: 0.0 oz  . Drug use: No    Comment: No hx of IV drug use     Allergies   Tuberculin tests; Valacyclovir; Codeine; Penicillins; and Sulfamethoxazole   Review of Systems Review of Systems  Constitutional:       Per HPI, otherwise negative  HENT:       Per HPI, otherwise negative  Respiratory:       Per HPI, otherwise negative  Cardiovascular:       Chest pain, per HPI  Gastrointestinal: Positive for abdominal pain and diarrhea. Negative for vomiting.  Endocrine:       Negative aside from HPI  Genitourinary:       Neg aside from HPI   Musculoskeletal:       Per HPI, otherwise negative  Skin: Negative.   Allergic/Immunologic: Positive for immunocompromised state.  Neurological: Positive for weakness. Negative for syncope.     Physical Exam Updated Vital Signs BP (!) 116/44 (BP Location: Right Arm)   Pulse 80   Temp 98.3 F (36.8 C)  (Oral)   Resp 18   SpO2 100%   Physical Exam  Constitutional: She is oriented to person, place, and time. She appears well-developed and well-nourished. No distress.  Uncomfortable appearing elderly female awake, alert, describing severe pain from her sternum to her epigastrium, radiating posteriorly.  HENT:  Head: Normocephalic and atraumatic.  Eyes: Conjunctivae and EOM are normal.  Cardiovascular: Normal rate and regular rhythm.  Pulmonary/Chest: Effort normal and breath sounds normal. No stridor. No respiratory distress.  Abdominal: She exhibits no distension. There is tenderness. There is guarding.    Musculoskeletal: She exhibits no edema.  Neurological: She is alert and oriented to person, place, and time. No cranial nerve deficit.  Skin: Skin is warm and dry.  Psychiatric: She has a normal mood and affect.  Nursing note and vitals reviewed.    ED Treatments / Results  Labs (all labs ordered are listed, but only abnormal results are displayed) Labs Reviewed  BASIC METABOLIC PANEL  CBC  LIPASE, BLOOD  HEPATIC FUNCTION PANEL  I-STAT TROPONIN, ED    EKG  EKG Interpretation  Date/Time:  Thursday August 20 2017 08:26:34 EDT Ventricular Rate:  74 PR Interval:  192 QRS Duration: 78 QT Interval:  424 QTC Calculation: 470 R Axis:   49 Text Interpretation:  Normal sinus rhythm Artifact LBBB, has resolved since last tracing Abnormal ekg Confirmed by Carmin Muskrat [id number]) on 08/20/2017  8:36:38 AM       Radiology No results found.  Procedures Procedures (including critical care time)  Medications Ordered in ED Medications  gi cocktail (Maalox,Lidocaine,Donnatal) (has no administration in time range)  famotidine (PEPCID) IVPB 20 mg premix (has no administration in time range)  fentaNYL (SUBLIMAZE) injection 25 mcg (has no administration in time range)  sodium chloride 0.9 % bolus 500 mL (has no administration in time range)     Initial Impression / Assessment  and Plan / ED Course  I have reviewed the triage vital signs and the nursing notes.  Pertinent labs & imaging results that were available during my care of the patient were reviewed by me and considered in my medical decision making (see chart for details).   Chart review notable for multiple medical issues, including end-stage renal disease, prior evaluations for chest and abdominal pain, with imaging, for evaluation of dissection, diverticulitis within the past 5 months. Pertinent CT, and CT angiography studies, done within the past 5 months, as below  CT angiography IMPRESSION: 1. No aortic aneurysm or dissection detected. Moderate to severe atherosclerotic changes are seen, particularly in the abdomen. Evaluation of the ascending thoracic aorta is limited due to cardiac motion but there is no definitive evidence of aneurysm or dissection. An ulcerating plaque is seen in the abdominal aorta but no dissection is noted. High-grade stenosis is seen in the proximal internal iliac arteries. 2. There is a mass in the left kidney which could represent a hyperdense cyst or malignancy. An MRI could better evaluate. 3. Left adrenal nodule. Recommend attention to the left adrenal nodule on the recommended MRI.     Electronically Signed   By: Dorise Bullion III M.D   On: 04/19/2016 17:31   CT abdomen pelvis IMPRESSION: 1. Extensive colonic diverticulosis, greatest distally where there is some wall thickening in the sigmoid colon. No surrounding inflammation to suggest active diverticulitis. No evidence of bowel perforation or obstruction. 2. Indeterminate lesion centrally in the left kidney has enlarged from previous noncontrast study of 2016. Although incompletely characterized by this study, this was felt to be a cyst on prior ultrasound. Consider dedicated renal CT (without and with contrast) to exclude subtle enhancement. 3. Extensive Aortic Atherosclerosis (ICD10-I70.0). 4. Ventral  hernia containing only fat.     Electronically Signed   By: Richardean Sale M.D.   On: 07/21/2017 17:06   On initial exam the patient is slightly hypotensive, in broad differentials considered including infectious, hypovolemic, gastric, esophageal, cardiac etiology. Patient will receive small bolus of fluids given hypotension, labs, CT imaging.  Update:, Patient has improved substantially after GI cocktail, morphine, vital signs now normal.  Update:, IV access has been obtained. Patient remains calm, hemodynamically stable. 3:45 PM CT results discussed with patient and family members. CT generally reassuring, with demonstration of diverticulosis, no diverticulitis, no other obvious changes within the aorta. CT is consistent with other recent CT scans. Patient has remained calm since morphine and GI cocktail, and with symmetric bilateral pulses, no persistent hypotension, no hypertension, there is low suspicion for hypovolemic/hemorrhagic episode, or aortic dissection. In addition, patient's recent imaging studies of her aorta, and initial hypotension is inconsistent with hypertensive crisis aortic dissection.  1 given her substantial improvement here, patient is appropriate for discharge, will follow up with gastroenterology. I discussed patient's case with her gastro enterology service, and they will facilitate outpatient follow-up.  She has an appointment in 5 days, and in the interim, will begin taking Pepcid. It  is unclear if she has been using her previously prescribed PPI.   Final Clinical Impressions(s) / ED Diagnoses  Abdominal pain Atypical chest pain   Carmin Muskrat, MD 08/20/17 [phone]

## 2017-08-21 ENCOUNTER — Telehealth: Payer: Self-pay | Admitting: Family Medicine

## 2017-08-21 DIAGNOSIS — N2581 Secondary hyperparathyroidism of renal origin: Secondary | ICD-10-CM | POA: Diagnosis not present

## 2017-08-21 DIAGNOSIS — I132 Hypertensive heart and chronic kidney disease with heart failure and with stage 5 chronic kidney disease, or end stage renal disease: Secondary | ICD-10-CM | POA: Diagnosis not present

## 2017-08-21 DIAGNOSIS — I2510 Atherosclerotic heart disease of native coronary artery without angina pectoris: Secondary | ICD-10-CM | POA: Diagnosis not present

## 2017-08-21 DIAGNOSIS — D631 Anemia in chronic kidney disease: Secondary | ICD-10-CM | POA: Diagnosis not present

## 2017-08-21 DIAGNOSIS — R1013 Epigastric pain: Secondary | ICD-10-CM | POA: Diagnosis not present

## 2017-08-21 DIAGNOSIS — J441 Chronic obstructive pulmonary disease with (acute) exacerbation: Secondary | ICD-10-CM | POA: Diagnosis not present

## 2017-08-21 DIAGNOSIS — Z992 Dependence on renal dialysis: Secondary | ICD-10-CM | POA: Diagnosis not present

## 2017-08-21 DIAGNOSIS — I509 Heart failure, unspecified: Secondary | ICD-10-CM | POA: Diagnosis not present

## 2017-08-21 DIAGNOSIS — N186 End stage renal disease: Secondary | ICD-10-CM | POA: Diagnosis not present

## 2017-08-21 DIAGNOSIS — J9621 Acute and chronic respiratory failure with hypoxia: Secondary | ICD-10-CM | POA: Diagnosis not present

## 2017-08-21 NOTE — Telephone Encounter (Signed)
It is Ok to give verbal authorization for Copywriter, advertising.  Thanks, BJ

## 2017-08-21 NOTE — Telephone Encounter (Signed)
Copied from [place]. Topic: Quick Communication - See Telephone Encounter >> Aug 21, 2017  4:02 PM Antonieta Iba C wrote:   CRM for notification. See Telephone encounter for: 08/21/17.  Merry Proud with [facility] [phone]) called in to request orders for a Education officer, museum. He says that the family feels that they would benefit from speaking with one.

## 2017-08-24 DIAGNOSIS — N2581 Secondary hyperparathyroidism of renal origin: Secondary | ICD-10-CM | POA: Diagnosis not present

## 2017-08-24 DIAGNOSIS — D631 Anemia in chronic kidney disease: Secondary | ICD-10-CM | POA: Diagnosis not present

## 2017-08-24 DIAGNOSIS — N186 End stage renal disease: Secondary | ICD-10-CM | POA: Diagnosis not present

## 2017-08-24 DIAGNOSIS — Z992 Dependence on renal dialysis: Secondary | ICD-10-CM | POA: Diagnosis not present

## 2017-08-24 NOTE — Telephone Encounter (Signed)
Spoke with Merry Proud, gave verbal orders for Education officer, museum.

## 2017-08-25 ENCOUNTER — Other Ambulatory Visit: Payer: Medicare Other

## 2017-08-25 ENCOUNTER — Ambulatory Visit (INDEPENDENT_AMBULATORY_CARE_PROVIDER_SITE_OTHER): Payer: Medicare Other | Admitting: Gastroenterology

## 2017-08-25 ENCOUNTER — Encounter: Payer: Self-pay | Admitting: Gastroenterology

## 2017-08-25 VITALS — BP 132/48 | HR 84 | Ht 61.0 in | Wt 158.2 lb

## 2017-08-25 DIAGNOSIS — J441 Chronic obstructive pulmonary disease with (acute) exacerbation: Secondary | ICD-10-CM | POA: Diagnosis not present

## 2017-08-25 DIAGNOSIS — I2510 Atherosclerotic heart disease of native coronary artery without angina pectoris: Secondary | ICD-10-CM | POA: Diagnosis not present

## 2017-08-25 DIAGNOSIS — R197 Diarrhea, unspecified: Secondary | ICD-10-CM | POA: Diagnosis not present

## 2017-08-25 DIAGNOSIS — R109 Unspecified abdominal pain: Secondary | ICD-10-CM

## 2017-08-25 DIAGNOSIS — I132 Hypertensive heart and chronic kidney disease with heart failure and with stage 5 chronic kidney disease, or end stage renal disease: Secondary | ICD-10-CM | POA: Diagnosis not present

## 2017-08-25 DIAGNOSIS — I509 Heart failure, unspecified: Secondary | ICD-10-CM | POA: Diagnosis not present

## 2017-08-25 DIAGNOSIS — J9621 Acute and chronic respiratory failure with hypoxia: Secondary | ICD-10-CM | POA: Diagnosis not present

## 2017-08-25 DIAGNOSIS — N281 Cyst of kidney, acquired: Secondary | ICD-10-CM | POA: Diagnosis not present

## 2017-08-25 DIAGNOSIS — N186 End stage renal disease: Secondary | ICD-10-CM | POA: Diagnosis not present

## 2017-08-25 MED ORDER — GLYCOPYRROLATE 2 MG PO TABS: 2.0000 mg | ORAL_TABLET | Freq: Two times a day (BID) | ORAL | 11 refills | Status: AC

## 2017-08-25 NOTE — Patient Instructions (Signed)
Discontinue dicyclomine.   We have sent the following medications to your pharmacy for you to pick up at your convenience: glycopyrrolate.  Your physician has requested that you go to the basement for the following lab work before leaving today: GI pathogen panel.   Normal BMI (Body Mass Index- based on height and weight) is between 23 and 30. Your BMI today is Body mass index is 29.9 kg/m. Marland Kitchen Please consider follow up  regarding your BMI with your Primary Care Provider.  Thank you for choosing me and [facility].  Pricilla Riffle. Dagoberto Ligas., MD., Marval Regal

## 2017-08-25 NOTE — Progress Notes (Signed)
History of Present Illness: This is a 77 year old female returning for follow-up of diarrhea and left sided abdominal pain.  She is accompanied by her sister. She was seen in our office in Feb, earlier in March and again today. She was seen in [facility] ED on 3/21 for chest, abdomen and back pain. WBD=12.4, lipase normal, LFT normal, Hb normal.  She has multiple comorbidities including ESRD on HD, CHF, COPD with resp failure.  Symptoms improved with morphine, a GI cocktail and hydration.  She notes her left-sided abdominal pain and diarrhea are often more pronounced following her dialysis.  She states her appetite has been decreased.  She is hungry in the morning for breakfast but has no appetite the rest of the day. She is losing weight.  Weight was 180 pounds in May 2018 and is 158 pounds today.  She states that her dialysis has been discontinued early due to low blood pressure.  She has frequent left hip and back pain.  She was treated with a course of Cipro and Flagyl earlier this month which did not impact her diarrhea or abdominal pain.  She states she was evaluated by urology and the renal lesion noted on imaging did not turn out to be of any clinical significance per her report.  EGD 10/2016: mild gastritis (reactive gastropathy) Colonoscopy 08/2015 severe diverticulosis, colon polyps (tubular adenomas)  CT angiography IMPRESSION 08/20/2017: 1. No aortic aneurysm or dissection detected. Moderate to severe atherosclerotic changes are seen, particularly in the abdomen. Evaluation of the ascending thoracic aorta is limited due to cardiac motion but there is no definitive evidence of aneurysm or dissection. An ulcerating plaque is seen in the abdominal aorta but no dissection is noted. High-grade stenosis is seen in the proximal internal iliac arteries. 2. There is a mass in the left kidney which could represent a hyperdense cyst or malignancy. An MRI could better evaluate. 3. Left adrenal nodule.  Recommend attention to the left adrenal nodule on the recommended MRI.  CT abdomen pelvis IMPRESSION 08/20/2017: 1. Extensive colonic diverticulosis, greatest distally where there is some wall thickening in the sigmoid colon. No surrounding inflammation to suggest active diverticulitis. No evidence of bowel perforation or obstruction. 2. Indeterminate lesion centrally in the left kidney has enlarged from previous noncontrast study of 2016. Although incompletely characterized by this study, this was felt to be a cyst on prior ultrasound. Consider dedicated renal CT (without and with contrast) to exclude subtle enhancement. 3. Extensive Aortic Atherosclerosis (ICD10-I70.0). 4. Ventral hernia containing only fat.   Current Medications, Allergies, Past Medical History, Past Surgical History, Family History and Social History were reviewed in [employer] record.  Physical Exam: General: Well developed, well nourished, elderly, chronically ill-appearing,  no acute distress Head: Normocephalic and atraumatic Eyes:  sclerae anicteric, EOMI Ears: Normal auditory acuity Mouth: No deformity or lesions Lungs: Clear throughout to auscultation Heart: Regular rate and rhythm; no murmurs, rubs or bruits Abdomen: Soft, non tender and non distended. No masses, hepatosplenomegaly or hernias noted. Normal Bowel sounds Musculoskeletal: Symmetrical with no gross deformities  Pulses:  Normal pulses noted Extremities: No clubbing, cyanosis, edema or deformities noted Neurological: Alert oriented x 4, grossly nonfocal Psychological:  Alert and cooperative. Normal mood and affect  Assessment and Recommendations:  1. Left sided abdominal pain, weight loss, decreased appetite, diarrhea.  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  Maintain adequate intravascular volume.  Renal physician to review.  Obtain GI pathogen  panel.  Begin  glycopyrrolate 2 mg twice daily and discontinue dicyclomine.  I advised her to see her PCP regarding further evaluate her back pain that could be radiating to her left abdomen.  Imodium 1-2 qid as needed.  Eliminate foods that trigger diarrhea  2. Fecal incontinence with very weak internal and external sphincter muscles by prior anorectal manometry  3. GERD. Continue omeprazole 40 mg twice daily.  Follow antireflux measures.    4. ESRD with HD on M-W-F.

## 2017-08-25 NOTE — Addendum Note (Signed)
Addended by: Marzella Schlein on: 08/25/2017 04:40 PM   Modules accepted: Orders

## 2017-08-26 DIAGNOSIS — I132 Hypertensive heart and chronic kidney disease with heart failure and with stage 5 chronic kidney disease, or end stage renal disease: Secondary | ICD-10-CM | POA: Diagnosis not present

## 2017-08-26 DIAGNOSIS — N2581 Secondary hyperparathyroidism of renal origin: Secondary | ICD-10-CM | POA: Diagnosis not present

## 2017-08-26 DIAGNOSIS — Z992 Dependence on renal dialysis: Secondary | ICD-10-CM | POA: Diagnosis not present

## 2017-08-26 DIAGNOSIS — D631 Anemia in chronic kidney disease: Secondary | ICD-10-CM | POA: Diagnosis not present

## 2017-08-26 DIAGNOSIS — J9621 Acute and chronic respiratory failure with hypoxia: Secondary | ICD-10-CM | POA: Diagnosis not present

## 2017-08-26 DIAGNOSIS — I509 Heart failure, unspecified: Secondary | ICD-10-CM | POA: Diagnosis not present

## 2017-08-26 DIAGNOSIS — I2510 Atherosclerotic heart disease of native coronary artery without angina pectoris: Secondary | ICD-10-CM | POA: Diagnosis not present

## 2017-08-26 DIAGNOSIS — J441 Chronic obstructive pulmonary disease with (acute) exacerbation: Secondary | ICD-10-CM | POA: Diagnosis not present

## 2017-08-26 DIAGNOSIS — N186 End stage renal disease: Secondary | ICD-10-CM | POA: Diagnosis not present

## 2017-08-27 ENCOUNTER — Telehealth: Payer: Self-pay | Admitting: Family Medicine

## 2017-08-27 DIAGNOSIS — J441 Chronic obstructive pulmonary disease with (acute) exacerbation: Secondary | ICD-10-CM | POA: Diagnosis not present

## 2017-08-27 DIAGNOSIS — I132 Hypertensive heart and chronic kidney disease with heart failure and with stage 5 chronic kidney disease, or end stage renal disease: Secondary | ICD-10-CM | POA: Diagnosis not present

## 2017-08-27 DIAGNOSIS — I509 Heart failure, unspecified: Secondary | ICD-10-CM | POA: Diagnosis not present

## 2017-08-27 DIAGNOSIS — I2510 Atherosclerotic heart disease of native coronary artery without angina pectoris: Secondary | ICD-10-CM | POA: Diagnosis not present

## 2017-08-27 DIAGNOSIS — J9621 Acute and chronic respiratory failure with hypoxia: Secondary | ICD-10-CM | POA: Diagnosis not present

## 2017-08-27 DIAGNOSIS — N186 End stage renal disease: Secondary | ICD-10-CM | POA: Diagnosis not present

## 2017-08-27 NOTE — Telephone Encounter (Signed)
Copied from [place]. Topic: Quick Communication - See Telephone Encounter >> Aug 27, 2017  1:40 PM Cleaster Corin, NT wrote: CRM for notification. See Telephone encounter for: 08/27/17.  Caryl Pina from [facility] calling to see if fax had been received for bedside commode [phone] ext.[place]

## 2017-08-27 NOTE — Telephone Encounter (Signed)
Spoke with Caryl Pina from [facility], had her fax me another copy for the orders, received fax and placed in Dr. Doug Sou basket for completion. Informed Caryl Pina that provider is out until Monday and verbalized understanding.

## 2017-08-28 ENCOUNTER — Telehealth: Payer: Self-pay | Admitting: Family Medicine

## 2017-08-28 DIAGNOSIS — D631 Anemia in chronic kidney disease: Secondary | ICD-10-CM | POA: Diagnosis not present

## 2017-08-28 DIAGNOSIS — N2581 Secondary hyperparathyroidism of renal origin: Secondary | ICD-10-CM | POA: Diagnosis not present

## 2017-08-28 DIAGNOSIS — Z992 Dependence on renal dialysis: Secondary | ICD-10-CM | POA: Diagnosis not present

## 2017-08-28 DIAGNOSIS — N186 End stage renal disease: Secondary | ICD-10-CM | POA: Diagnosis not present

## 2017-08-28 NOTE — Telephone Encounter (Signed)
Copied from [place] [id number]. Topic: Quick Communication - See Telephone Encounter >> Aug 28, 2017  3:40 PM Vernona Rieger wrote: CRM for notification. See Telephone encounter for: 08/28/17.  Angel from [facility] needs verbal orders for 1 week 9 for skilled nursing & Social worker eval Call back is [phone]

## 2017-08-31 DIAGNOSIS — N2581 Secondary hyperparathyroidism of renal origin: Secondary | ICD-10-CM | POA: Diagnosis not present

## 2017-08-31 DIAGNOSIS — N186 End stage renal disease: Secondary | ICD-10-CM | POA: Diagnosis not present

## 2017-08-31 DIAGNOSIS — D631 Anemia in chronic kidney disease: Secondary | ICD-10-CM | POA: Diagnosis not present

## 2017-08-31 DIAGNOSIS — Z992 Dependence on renal dialysis: Secondary | ICD-10-CM | POA: Diagnosis not present

## 2017-08-31 DIAGNOSIS — E1129 Type 2 diabetes mellitus with other diabetic kidney complication: Secondary | ICD-10-CM | POA: Diagnosis not present

## 2017-08-31 NOTE — Telephone Encounter (Signed)
Spoke with Glenard Haring, gave verbal orders per Dr. Martinique.

## 2017-09-01 NOTE — Telephone Encounter (Signed)
Orders faxed over on 09/01/17.

## 2017-09-01 NOTE — Telephone Encounter (Signed)
Caryl Pina checking status, call back [phone] ext [phone]

## 2017-09-02 ENCOUNTER — Ambulatory Visit: Payer: Medicare Other | Admitting: Family Medicine

## 2017-09-02 ENCOUNTER — Telehealth: Payer: Self-pay | Admitting: Gastroenterology

## 2017-09-02 DIAGNOSIS — N2581 Secondary hyperparathyroidism of renal origin: Secondary | ICD-10-CM | POA: Diagnosis not present

## 2017-09-02 DIAGNOSIS — Z992 Dependence on renal dialysis: Secondary | ICD-10-CM | POA: Diagnosis not present

## 2017-09-02 DIAGNOSIS — N186 End stage renal disease: Secondary | ICD-10-CM | POA: Diagnosis not present

## 2017-09-02 DIAGNOSIS — D631 Anemia in chronic kidney disease: Secondary | ICD-10-CM | POA: Diagnosis not present

## 2017-09-02 MED ORDER — FLUCONAZOLE 150 MG PO TABS: 150.0000 mg | ORAL_TABLET | Freq: Once | ORAL | 0 refills | Status: AC

## 2017-09-02 NOTE — Telephone Encounter (Signed)
Left message for patient to call back  

## 2017-09-02 NOTE — Telephone Encounter (Signed)
I do not understand why she cannot take Miralax as it should be safe for renal patients. Recommend clear liquid diet and Miralax qid until bowels move. Have her check with her nephrologist regarding Miralax. Avoid Imodium unless diarrhea return. Diflucan 150 mg x1 immediately after HD.

## 2017-09-02 NOTE — Telephone Encounter (Signed)
I spoke with the patient and gave her the Miralax and Diflucan instructions.  She verbalized understanding of how to take both medications.

## 2017-09-02 NOTE — Telephone Encounter (Signed)
Patient reports that she has not had a BM in 8 days.  I verified that she is not taking imodium as recommended the last office visit on 08/25/17.  She is taking robinul BID.  She reports she is miserable from the abdominal discomfort.  She is unable to take Miralax due to her ESRD.  She also reports that she has a yeast rash from the cipro and flagyl that was prescribed by Alonza Bogus, PA.  Please advise rx for yeast as well as advice for severe constipation.

## 2017-09-02 NOTE — Telephone Encounter (Signed)
See other phone note from today.

## 2017-09-03 ENCOUNTER — Telehealth: Payer: Self-pay | Admitting: Family Medicine

## 2017-09-03 DIAGNOSIS — I132 Hypertensive heart and chronic kidney disease with heart failure and with stage 5 chronic kidney disease, or end stage renal disease: Secondary | ICD-10-CM | POA: Diagnosis not present

## 2017-09-03 DIAGNOSIS — N186 End stage renal disease: Secondary | ICD-10-CM | POA: Diagnosis not present

## 2017-09-03 DIAGNOSIS — I509 Heart failure, unspecified: Secondary | ICD-10-CM | POA: Diagnosis not present

## 2017-09-03 DIAGNOSIS — J441 Chronic obstructive pulmonary disease with (acute) exacerbation: Secondary | ICD-10-CM | POA: Diagnosis not present

## 2017-09-03 DIAGNOSIS — I2510 Atherosclerotic heart disease of native coronary artery without angina pectoris: Secondary | ICD-10-CM | POA: Diagnosis not present

## 2017-09-03 DIAGNOSIS — J9621 Acute and chronic respiratory failure with hypoxia: Secondary | ICD-10-CM | POA: Diagnosis not present

## 2017-09-03 NOTE — Telephone Encounter (Signed)
I spoke with [name], advised to call GI again for advise and she agreed, Dr. Martinique is not in the office however I will forward the note.

## 2017-09-03 NOTE — Telephone Encounter (Signed)
Copied from [place] [id number]. Topic: Quick Communication - See Telephone Encounter >> Sep 03, 2017 10:14 AM Clack, Laban Emperor wrote: CRM for notification. See Telephone encounter for: 09/03/17.  Princess with [facility] wanted to let the provider know that the pt has not had a bowel movement in a bout 9 days. Pt did contact her GI dr and was told to take 4 dosage of miralax, since 09/02/17. But still have not had a bowel movement. She almost wanted to the provider know the pt fall on Saturday, pt has no injuries.  Contact# [phone]

## 2017-09-04 DIAGNOSIS — D631 Anemia in chronic kidney disease: Secondary | ICD-10-CM | POA: Diagnosis not present

## 2017-09-04 DIAGNOSIS — N2581 Secondary hyperparathyroidism of renal origin: Secondary | ICD-10-CM | POA: Diagnosis not present

## 2017-09-04 DIAGNOSIS — Z992 Dependence on renal dialysis: Secondary | ICD-10-CM | POA: Diagnosis not present

## 2017-09-04 DIAGNOSIS — N186 End stage renal disease: Secondary | ICD-10-CM | POA: Diagnosis not present

## 2017-09-04 NOTE — Telephone Encounter (Signed)
Called patient and gave instructions per Dr. Martinique. Patient verbalized understanding.

## 2017-09-04 NOTE — Telephone Encounter (Signed)
If she is not having abdominal pain,nasuea,and vomiting + able to pass gas ,she can add Bisacodyl 5 mg once daily and increase fluid intake.  Thanks, BJ

## 2017-09-04 NOTE — Telephone Encounter (Signed)
Left message to give clinic a call back. 

## 2017-09-04 NOTE — Telephone Encounter (Signed)
Message sent to Dr.Jordan. 

## 2017-09-07 DIAGNOSIS — Z992 Dependence on renal dialysis: Secondary | ICD-10-CM | POA: Diagnosis not present

## 2017-09-07 DIAGNOSIS — N186 End stage renal disease: Secondary | ICD-10-CM | POA: Diagnosis not present

## 2017-09-07 DIAGNOSIS — D631 Anemia in chronic kidney disease: Secondary | ICD-10-CM | POA: Diagnosis not present

## 2017-09-07 DIAGNOSIS — N2581 Secondary hyperparathyroidism of renal origin: Secondary | ICD-10-CM | POA: Diagnosis not present

## 2017-09-08 ENCOUNTER — Ambulatory Visit (INDEPENDENT_AMBULATORY_CARE_PROVIDER_SITE_OTHER): Payer: Medicare Other | Admitting: Family Medicine

## 2017-09-08 ENCOUNTER — Other Ambulatory Visit: Payer: Medicare Other

## 2017-09-08 ENCOUNTER — Encounter: Payer: Self-pay | Admitting: Family Medicine

## 2017-09-08 VITALS — BP 130/76 | HR 71 | Temp 98.3°F | Resp 16 | Ht 61.0 in | Wt 157.4 lb

## 2017-09-08 DIAGNOSIS — D489 Neoplasm of uncertain behavior, unspecified: Secondary | ICD-10-CM | POA: Diagnosis not present

## 2017-09-08 DIAGNOSIS — R21 Rash and other nonspecific skin eruption: Secondary | ICD-10-CM

## 2017-09-08 DIAGNOSIS — K219 Gastro-esophageal reflux disease without esophagitis: Secondary | ICD-10-CM | POA: Diagnosis not present

## 2017-09-08 DIAGNOSIS — R197 Diarrhea, unspecified: Secondary | ICD-10-CM

## 2017-09-08 DIAGNOSIS — M545 Low back pain, unspecified: Secondary | ICD-10-CM

## 2017-09-08 DIAGNOSIS — R109 Unspecified abdominal pain: Secondary | ICD-10-CM

## 2017-09-08 MED ORDER — PANTOPRAZOLE SODIUM 20 MG PO TBEC: 20.0000 mg | DELAYED_RELEASE_TABLET | Freq: Two times a day (BID) | ORAL | 1 refills | Status: DC

## 2017-09-08 MED ORDER — FLUCONAZOLE 150 MG PO TABS: ORAL_TABLET | ORAL | 0 refills | Status: DC

## 2017-09-08 MED ORDER — NYSTATIN-TRIAMCINOLONE 100000-0.1 UNIT/GM-% EX CREA: 1.0000 "application " | TOPICAL_CREAM | Freq: Two times a day (BID) | CUTANEOUS | 0 refills | Status: DC

## 2017-09-08 NOTE — Patient Instructions (Addendum)
A few things to remember from today's visit:   Acute bilateral low back pain without sciatica - Plan: DG Lumbar Spine Complete  Perineal rash in female - Plan: nystatin-triamcinolone (MYCOLOG II) cream  Gastroesophageal reflux disease without esophagitis - Plan: pantoprazole (PROTONIX) 20 MG tablet  Use antibacterial soap. Omeprazole stopped. Protonix 20 mg 2 times daily before meals.  Use wipes to clean after bowel movements.  Tylenol 650 mg 3 times per day. Icy hot patch on lower back may also help.  Today X ray was ordered.  This can be done at [facility] at [facility] between 8 am and 5 pm: [place]. [phone].     Intertrigo Intertrigo is skin irritation (inflammation) that happens in warm, moist areas of the body. The irritation can cause a rash and make skin raw and itchy. The rash is usually pink or red. It happens mostly between folds of skin or where skin rubs together, such as:  Toes.  Armpits.  Groin.  Belly.  Breasts.  Buttocks.  This condition is not passed from person to person (is not contagious). Follow these instructions at home:  Keep the affected area clean and dry.  Do not scratch your skin.  Stay cool as much as possible. Use an air conditioner or fan, if you can.  Apply over-the-counter and prescription medicines only as told by your doctor.  If you were prescribed an antibiotic medicine, use it as told by your doctor. Do not stop using the antibiotic even if your condition starts to get better.  Keep all follow-up visits as told by your doctor. This is important. How is this prevented?  Stay at a healthy weight.  Keep your feet dry. This is very important if you have diabetes. Wear cotton or wool socks.  Take care of and protect the skin in your groin and butt area as told by your doctor.  Do not wear tight clothes. Wear clothes that: ? Are loose. ? Take away moisture from your body. ? Are made of  cotton.  Wear a bra that gives good support, if needed.  Shower and dry yourself fully after being active.  Keep your blood sugar under control if you have diabetes. Contact a doctor if:  Your symptoms do not get better with treatment.  Your symptoms get worse or they spread.  You notice more redness and warmth.  You have a fever. This information is not intended to replace advice given to you by your health care provider. Make sure you discuss any questions you have with your health care provider. Document Released: 06/21/2010 Document Revised: 10/25/2015 Document Reviewed: 11/20/2014 Elsevier Interactive Patient Education  2018 [employer].  Please be sure medication list is accurate. If a new problem present, please set up appointment sooner than planned today.

## 2017-09-08 NOTE — Progress Notes (Signed)
ACUTE VISIT   HPI:  Chief Complaint  Patient presents with  . Vaginitis    hasn't gone away, has lesions and burning in vaginal and rectum area for over 2 weeks    [name] is a 77 y.o. female, who is here today complaining of 2 weeks of burning sensation on perineal area that started about 2 weeks ago and getting worse. She also mentions "a lesion" she noted also 2 weeks ago.  She mentions that about 3 years ago she was diagnosed with herpes, so she wonders if this is related.  She has not noted vaginal discharge or bleeding. She has used OTC Neosporin, which seems to help. She also took one Diflucan tab.  History of fecal incontinence, wears depends.  ESRD she is producing urine intermittently and denies dysuria or gross hematuria.   GERD: She is also complaining of epigastric pain,lower mid sternal chest discomfort, and "terrible" acid reflux. Hx of hiatal hernia.  She has not identified exacerbating factors. She is currently on omeprazole 40 mg twice daily.  Occasionally she had nausea but no vomiting. She has no noted melena.  Constipation has improved with Dulcolax, last bowel movement today.  Back pain: She has Hx of back pain (per records) but she states that this pain is new onset, started yesterday.Achy pain,no radiated, 6/10, intermittent. She has taken OTC Tylenol.   She was in the ER on 08/20/17 c/o back, chest,and epigastric pain.  She follows with neurologist for back pain,peripheral neuropathy,generalized weakness, as well as unstable gait.  Lumbar MRI 12/2016: This MRI of the lumbar spine shows multilevel degenerative changes as detailed above that did not lead to any nerve root compression. The most significant findings are: 1.    At L2-L3 and L3-L4, there is mild spinal stenosis.   There does not appear to be any nerve root compression. 2.   At L4-L5, there is borderline spinal stenosis is not appear to be any nerve root  compression. 3.   Compared to the MRI dated 05/12/2015, there is no significant interval change  No associated fever, skin rash or edema on affected area. She felt about 2 weeks ago, she did not have problems at that time and seemed to be unharmed.   She uses a cane to help with walking.   Also reports shaking all the time and body itching all over.  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 No acute findings  ESRD on hemodialysis 3 times per week.   Review of Systems  Constitutional: Positive for activity change and fatigue. Negative for appetite change, chills and fever.  HENT: Negative for mouth sores and sore throat.   Respiratory: Negative for cough, shortness of breath and wheezing.   Cardiovascular: Negative for palpitations and leg swelling.  Gastrointestinal: Positive for constipation. Negative for abdominal pain, nausea and vomiting.  Genitourinary: Positive for vaginal discharge. Negative for genital sores, pelvic pain and vaginal bleeding.  Musculoskeletal: Positive for back pain and gait problem.  Skin: Positive for rash. Negative for wound.  Neurological: Negative for syncope, facial asymmetry and speech difficulty.  Psychiatric/Behavioral: Negative for confusion. The patient is nervous/anxious.       Current Outpatient Medications on File Prior to Visit  Medication Sig Dispense Refill  . albuterol (PROVENTIL HFA;VENTOLIN HFA) 108 (90 Base) MCG/ACT inhaler Inhale 2 puffs into the lungs every 4 (four) hours as needed for wheezing or shortness of breath. 1 Inhaler  1  . allopurinol (ZYLOPRIM) 100 MG tablet Take 100 mg by mouth once daily on non-dialysis days (Tues/Thurs/Sat/Sun) (Patient taking differently: Take 100 mg by mouth See admin instructions. Take one tablet (100 mg) by mouth once daily on non-dialysis days (Tues/Thurs/Sat/Sun)) 30 tablet 0  . amLODipine (NORVASC) 10 MG tablet TAKE 1 TABLET BY MOUTH  AT BEDTIME (Patient taking differently: TAKE 1 TABLET (10 MG) BY MOUTH AT BEDTIME) 90 tablet 1  . aspirin EC 81 MG tablet Take 81 mg by mouth daily.     Marland Kitchen atorvastatin (LIPITOR) 40 MG tablet Take 1 tablet (40 mg total) by mouth daily. (Patient taking differently: Take 40 mg by mouth at bedtime. ) 90 tablet 3  . azelastine (ASTELIN) 0.1 % nasal spray Place 1 spray into both nostrils 2 (two) times daily. Use in each nostril as directed 30 mL 6  . budesonide-formoterol (SYMBICORT) 160-4.5 MCG/ACT inhaler Inhale 2 puffs into the lungs 2 (two) times daily. 1 Inhaler 12  . cinacalcet (SENSIPAR) 60 MG tablet Take 60 mg by mouth daily.    . cinacalcet (SENSIPAR) 90 MG tablet Take 90 mg by mouth at bedtime.    . dicyclomine (BENTYL) 10 MG capsule Take 1 capsule (10 mg total) by mouth 4 (four) times daily -  before meals and at bedtime. 120 capsule 1  . DULoxetine (CYMBALTA) 30 MG capsule Take 2 capsules (60 mg total) by mouth daily. (Patient taking differently: Take 30 mg by mouth at bedtime. ) 180 capsule 3  . fluticasone (FLONASE) 50 MCG/ACT nasal spray Place 2 sprays into both nostrils daily as needed for allergies or rhinitis. 16 g 3  . glycopyrrolate (ROBINUL) 2 MG tablet Take 1 tablet (2 mg total) by mouth 2 (two) times daily. 60 tablet 11  . lidocaine-prilocaine (EMLA) cream Apply 1 application topically See admin instructions. Apply topically one hour before dialysis - Monday, Wednesday, Friday    . loratadine (CLARITIN) 10 MG tablet 10 mg every other day. 45 tablet 1  . losartan (COZAAR) 100 MG tablet Take 1 tablet (100 mg total) by mouth daily. 90 tablet 1  . metoprolol succinate (TOPROL-XL) 100 MG 24 hr tablet TAKE 1 TABLET BY MOUTH TWICE DAILY (Patient taking differently: TAKE 1 TABLET (100 MG)  BY MOUTH TWICE DAILY) 180 tablet 1  . multivitamin (RENA-VIT) TABS tablet Take 1 tablet by mouth at bedtime. (Patient taking differently: Take 1 tablet by mouth daily. ) 60 tablet 5  . nicotine (NICODERM  CQ - DOSED IN MG/24 HOURS) 21 mg/24hr patch Place 21 mg onto the skin daily.    Marland Kitchen nystatin (MYCOSTATIN) 100000 UNIT/ML suspension TAKE 5 MLS BY MOUTH 4 TIMES A DAY SWISH AND SWALLOW  0  . pregabalin (LYRICA) 100 MG capsule Take 100 mg by mouth at bedtime.     . sevelamer carbonate (RENVELA) 800 MG tablet Take 2,400 mg by mouth See admin instructions. Take 3 tablets (2400 mg) by mouth with each meal or snack - once or twice daily     No current facility-administered medications on file prior to visit.      Past Medical History:  Diagnosis Date  . Abnormality of gait 03/28/2015  . Adrenal mass ([place])   . 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 Arthritis   . Back pain   . CHF (congestive heart failure) ([place])   . Chronic kidney disease    Hemo MWF  . Congestion of  throat    Pt states she has a lot mucus in back throat.  . Constipation   . COPD (chronic obstructive pulmonary disease) ([place])   . Depression   . Dialysis patient [facility])    kidney  . Diverticulitis   . GERD (gastroesophageal reflux disease)   . H/O hiatal hernia   . High cholesterol   . Hoarseness of voice   . Hyperlipidemia   . Hypertension   . IBS (irritable bowel syndrome)   . Meralgia paresthetica of right side 12/26/2014  . Normal cardiac stress test 12/24/2009   lexiscan, imaging normal  . Renal disorder   . RLS (restless legs syndrome)   . Seizures ([place])    2004 past brain surgery  . Sinus complaint   . Thyroid disease   . TIA (transient ischemic attack)   . Tubular adenoma of colon 01/2008   Allergies  Allergen Reactions  . Tuberculin Tests Hives    "blisters"  . Valacyclovir Other (See Comments)    Confusion and nervousness  . Codeine Nausea And Vomiting  . Penicillins Rash    No problems breathing. Has tolerated omnicef in past without issue Has patient had a PCN reaction causing immediate rash, facial/tongue/throat swelling, SOB or lightheadedness with  hypotension: Yes Has patient had a PCN reaction causing severe rash involving mucus membranes or skin necrosis: No Has patient had a PCN reaction that required hospitalization No Has patient had a PCN reaction occurring within the last 10 years: No If all of the above answers are "NO", then may proceed with Cephalosporin  . Sulfamethoxazole Rash    Social History   Socioeconomic History  . Marital status: Legally Separated    Spouse name: Not on file  . Number of children: 3  . Years of education: 21  . Highest education level: Not on file  Occupational History  . Occupation: retired  Scientific laboratory technician  . Financial resource strain: Not on file  . Food insecurity:    Worry: Not on file    Inability: Not on file  . Transportation needs:    Medical: Not on file    Non-medical: Not on file  Tobacco Use  . Smoking status: Current Every Day Smoker    Packs/day: 1.00    Years: 60.00    Pack years: 60.00    Types: Cigarettes  . Smokeless tobacco: Never Used  . Tobacco comment: smoker since 77 yo.  1.5 ppd of [place] 100 lights. ; form given 10-23-16  Substance and Sexual Activity  . Alcohol use: No    Alcohol/week: 0.0 oz  . Drug use: No    Comment: No hx of IV drug use  . Sexual activity: Never    Birth control/protection: None  Lifestyle  . Physical activity:    Days per week: Not on file    Minutes per session: Not on file  . Stress: Not on file  Relationships  . Social connections:    Talks on phone: Not on file    Gets together: Not on file    Attends religious service: Not on file    Active member of club or organization: Not on file    Attends meetings of clubs or organizations: Not on file    Relationship status: Not on file  Other Topics Concern  . Not on file  Social History Narrative   ** Merged History Encounter **       Lives in [place] with Daughter, granddaughters (twins) and mentally challenged  niece.   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    Patient has a college education.   Patient drinks one cup of caffeine daily.             Vitals:   09/08/17 1152  BP: 130/76  Pulse: 71  Resp: 16  Temp: 98.3 F (36.8 C)  SpO2: 95%   Body mass index is 29.74 kg/m.   Physical Exam  Nursing note and vitals reviewed. Constitutional: She is oriented to person, place, and time. She appears well-developed. No distress.  HENT:  Head: Normocephalic and atraumatic.  Mouth/Throat: Oropharynx is clear and moist and mucous membranes are normal.  Eyes: Conjunctivae are normal.  Cardiovascular: Normal rate and regular rhythm.  Murmur (Soft SEM RUSB) heard. Respiratory: Effort normal and breath sounds normal. No respiratory distress.  GI: Soft. She exhibits no mass. There is tenderness in the epigastric area.  Genitourinary: There is rash and lesion on the right labia. There is rash on the left labia. No erythema or bleeding in the vagina.  Genitourinary Comments: There is a papular lesion in right major labia, does not seem to be tender, 6 mm, mobile.  Musculoskeletal: She exhibits no edema.       Lumbar back: She exhibits tenderness. She exhibits no bony tenderness and no edema.       Back:  Lymphadenopathy:    She has no cervical adenopathy.  Neurological: She is alert and oriented to person, place, and time.  No focal deficit appreciated. SLR negative bilateral. Unstable gait assisted with a cane.  Skin: Skin is warm. Rash noted. Rash is macular.  Mildly erythematous skin on major labia and extending to under coccyx. Burning upon palpation,no induration.  Psychiatric: Her speech is normal. Her mood appears anxious.  Appropriately groomed, good eye contact.      ASSESSMENT AND PLAN:   [name] was seen today for vaginitis.  Diagnoses and all orders for this visit:  Acute bilateral low back pain without sciatica  According to records this seems a chronic problem. She has had imaging in the past,including lumbar  MRI. Because she is reporting this problem as new + Hx of recent fall, recommended lumbar spine x-ray. Recommend OTC Tylenol 650 mg 3 times per day as needed as well as OTC icy hot patch.  -     DG Lumbar Spine Complete; Future  Perineal rash in female  Intertrigo. Topical nystatin/triamcinolone was prescribed initially but due to not coverage by her insurance, it was changed to [place]. Fecal incontinence might be aggravating this problem.  Adequate hygiene will help, she can use wipes instead of toilet paper. She can repeat Diflucan 1 week and weekly for a total of 2 doses. Follow-up as needed.  -     Discontinue: nystatin-triamcinolone (MYCOLOG II) cream; Apply 1 application topically 2 (two) times daily. -     fluconazole (DIFLUCAN) 150 MG tablet; Repeat dose weekly x 2 -     clotrimazole-betamethasone (LOTRISONE) cream; Apply 1 application topically 2 (two) times daily for 14 days.  Gastroesophageal reflux disease without esophagitis  Because still symptomatic, we will discontinue omeprazole and change to Protonix. GERD precautions also recommended. Follow-up in 4-6 weeks.  -     pantoprazole (PROTONIX) 20 MG tablet; Take 1 tablet (20 mg total) by mouth 2 (two) times daily before a meal.   Neoplasm of uncertain behavior  Lesion in major labia is most likely benign, it does not seem to be herpetic lesion.  It is a small and I think we can continue monitoring.  If persistent or she would like further evaluation we could arrange appointment with gynecologist to discuss removal.       Return in about 5 weeks (around 10/13/2017) for GERD.     -[name] was advised to seek immediate medical attention if sudden worsening symptoms.      Arrin Pintor G. Martinique, MD  [facility]. [place] office.

## 2017-09-08 NOTE — Telephone Encounter (Signed)
Pt states the medication she was given for the infection states they did not work for her and will be seeing her PCP today. States she will also be turning in her stool sample in today.

## 2017-09-09 DIAGNOSIS — N186 End stage renal disease: Secondary | ICD-10-CM | POA: Diagnosis not present

## 2017-09-09 DIAGNOSIS — N2581 Secondary hyperparathyroidism of renal origin: Secondary | ICD-10-CM | POA: Diagnosis not present

## 2017-09-09 DIAGNOSIS — Z992 Dependence on renal dialysis: Secondary | ICD-10-CM | POA: Diagnosis not present

## 2017-09-09 DIAGNOSIS — D631 Anemia in chronic kidney disease: Secondary | ICD-10-CM | POA: Diagnosis not present

## 2017-09-09 MED ORDER — CLOTRIMAZOLE-BETAMETHASONE 1-0.05 % EX CREA: 1.0000 "application " | TOPICAL_CREAM | Freq: Two times a day (BID) | CUTANEOUS | 0 refills | Status: AC

## 2017-09-10 ENCOUNTER — Telehealth: Payer: Self-pay | Admitting: Family Medicine

## 2017-09-10 DIAGNOSIS — I2510 Atherosclerotic heart disease of native coronary artery without angina pectoris: Secondary | ICD-10-CM | POA: Diagnosis not present

## 2017-09-10 DIAGNOSIS — N186 End stage renal disease: Secondary | ICD-10-CM | POA: Diagnosis not present

## 2017-09-10 DIAGNOSIS — I509 Heart failure, unspecified: Secondary | ICD-10-CM | POA: Diagnosis not present

## 2017-09-10 DIAGNOSIS — J9621 Acute and chronic respiratory failure with hypoxia: Secondary | ICD-10-CM | POA: Diagnosis not present

## 2017-09-10 DIAGNOSIS — I132 Hypertensive heart and chronic kidney disease with heart failure and with stage 5 chronic kidney disease, or end stage renal disease: Secondary | ICD-10-CM | POA: Diagnosis not present

## 2017-09-10 DIAGNOSIS — J441 Chronic obstructive pulmonary disease with (acute) exacerbation: Secondary | ICD-10-CM | POA: Diagnosis not present

## 2017-09-10 LAB — GASTROINTESTINAL PATHOGEN PANEL PCR
C. DIFFICILE TOX A/B, PCR: NOT DETECTED
CRYPTOSPORIDIUM, PCR: NOT DETECTED
Campylobacter, PCR: NOT DETECTED
E COLI (STEC) STX1/STX2, PCR: NOT DETECTED
E coli (ETEC) LT/ST PCR: NOT DETECTED
E coli 0157, PCR: NOT DETECTED
GIARDIA LAMBLIA, PCR: NOT DETECTED
NOROVIRUS, PCR: NOT DETECTED
ROTAVIRUS, PCR: NOT DETECTED
Salmonella, PCR: NOT DETECTED
Shigella, PCR: NOT DETECTED

## 2017-09-10 NOTE — Telephone Encounter (Unsigned)
Copied from [place] [id number]. Topic: Quick Communication - See Telephone Encounter >> Sep 10, 2017  3:01 PM Hewitt Shorts wrote: CRM for notification. See Telephone encounter for: 09/10/17. Merry Proud from [facility] is calling to let us know that the patient is feeling overwhelmed and would like to cancel home health for now  Best number if any qustions for jeff is [phone]

## 2017-09-11 DIAGNOSIS — Z992 Dependence on renal dialysis: Secondary | ICD-10-CM | POA: Diagnosis not present

## 2017-09-11 DIAGNOSIS — N186 End stage renal disease: Secondary | ICD-10-CM | POA: Diagnosis not present

## 2017-09-11 DIAGNOSIS — D631 Anemia in chronic kidney disease: Secondary | ICD-10-CM | POA: Diagnosis not present

## 2017-09-11 DIAGNOSIS — N2581 Secondary hyperparathyroidism of renal origin: Secondary | ICD-10-CM | POA: Diagnosis not present

## 2017-09-13 ENCOUNTER — Encounter (HOSPITAL_COMMUNITY): Payer: Self-pay | Admitting: Emergency Medicine

## 2017-09-13 ENCOUNTER — Other Ambulatory Visit: Payer: Self-pay

## 2017-09-13 ENCOUNTER — Emergency Department (HOSPITAL_COMMUNITY)
Admission: EM | Admit: 2017-09-13 | Discharge: 2017-09-14 | Disposition: A | Discharge status: Home / Self Care | Payer: Medicare Other | Attending: Emergency Medicine | Admitting: Emergency Medicine

## 2017-09-13 DIAGNOSIS — N189 Chronic kidney disease, unspecified: Secondary | ICD-10-CM | POA: Insufficient documentation

## 2017-09-13 DIAGNOSIS — J449 Chronic obstructive pulmonary disease, unspecified: Secondary | ICD-10-CM | POA: Diagnosis not present

## 2017-09-13 DIAGNOSIS — M79661 Pain in right lower leg: Secondary | ICD-10-CM | POA: Diagnosis not present

## 2017-09-13 DIAGNOSIS — E079 Disorder of thyroid, unspecified: Secondary | ICD-10-CM | POA: Diagnosis not present

## 2017-09-13 DIAGNOSIS — M792 Neuralgia and neuritis, unspecified: Secondary | ICD-10-CM | POA: Diagnosis not present

## 2017-09-13 DIAGNOSIS — M25552 Pain in left hip: Secondary | ICD-10-CM | POA: Diagnosis not present

## 2017-09-13 DIAGNOSIS — M79662 Pain in left lower leg: Secondary | ICD-10-CM | POA: Diagnosis not present

## 2017-09-13 DIAGNOSIS — I5022 Chronic systolic (congestive) heart failure: Secondary | ICD-10-CM | POA: Diagnosis not present

## 2017-09-13 DIAGNOSIS — I130 Hypertensive heart and chronic kidney disease with heart failure and stage 1 through stage 4 chronic kidney disease, or unspecified chronic kidney disease: Secondary | ICD-10-CM | POA: Insufficient documentation

## 2017-09-13 MED ORDER — OXYCODONE-ACETAMINOPHEN 5-325 MG PO TABS
2.0000 | ORAL_TABLET | Freq: Once | ORAL | Status: AC
  Administered 2017-09-13: 2 via ORAL
  Filled 2017-09-13: qty 2

## 2017-09-13 NOTE — ED Triage Notes (Signed)
Pt arrives via [facility] for chronic left hip pain and burning in her left foot and ankle. Pt stopped lyrica yesterday to see if this could be related to her pain. Pt reports to [facility] she only called for them so she didn't go to the waiting room.   Pt reports to this RN left hip pain that is chronic in nature with burning in her left foot and ankle. Pt reports she stopped her lyrica because her dialysis told her to do so because it could be causing her hip pain to flare up. Pt states nothing she does is helping.

## 2017-09-13 NOTE — ED Provider Notes (Signed)
[facility] EMERGENCY DEPARTMENT Provider Note   CSN: [id number] Arrival date & time: 09/13/17  2221     History   Chief Complaint Chief Complaint  Patient presents with  . Hip Pain    Left    HPI [name] is a 77 y.o. female.  Patient presents with daughter with complaint of severe LE pain c/w her neuropathy, starting last week and getting progressively worse. She reports she stopped taking Lyrica 2 days ago to see if the pain would get better. She has not tried anything else for pain. No fever, injury, redness or swelling. She states the pain is familiar to her as her neuropathic pain.  The history is provided by the patient and a relative.    Past Medical History:  Diagnosis Date  . Abnormality of gait 03/28/2015  . Adrenal mass ([place])   . 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 Arthritis   . Back pain   . CHF (congestive heart failure) ([place])   . Chronic kidney disease    Hemo MWF  . Congestion of throat    Pt states she has a lot mucus in back throat.  . Constipation   . COPD (chronic obstructive pulmonary disease) ([place])   . Depression   . Dialysis patient [facility])    kidney  . Diverticulitis   . GERD (gastroesophageal reflux disease)   . H/O hiatal hernia   . High cholesterol   . Hoarseness of voice   . Hyperlipidemia   . Hypertension   . IBS (irritable bowel syndrome)   . Meralgia paresthetica of right side 12/26/2014  . Normal cardiac stress test 12/24/2009   lexiscan, imaging normal  . Renal disorder   . RLS (restless legs syndrome)   . Seizures ([place])    2004 past brain surgery  . Sinus complaint   . Thyroid disease   . TIA (transient ischemic attack)   . Tubular adenoma of colon 01/2008    Patient Active Problem List   Diagnosis Date Noted  . LLQ abdominal pain 08/10/2017  . Left bundle branch block 07/24/2017  . Chest pain 07/24/2017  . Weakness 12/23/2016  . Generalized weakness  12/22/2016  . Elevated troponin 12/06/2016  . HCAP (healthcare-associated pneumonia) 12/06/2016  . Hypokalemia 12/06/2016  . Chronic systolic CHF (congestive heart failure) ([place]) 12/06/2016  . Osteopenia 09/08/2016  . Allergic rhinitis 08/07/2016  . BMI 32.0-32.9,adult 08/07/2016  . Onychomycosis 05/09/2016  . Nonspecific chest pain 04/19/2016  . ACS (acute coronary syndrome) ([place]) 04/19/2016  . Leg pain, bilateral 12/05/2015  . Trouble in sleeping 11/22/2015  . End stage renal disease ([place])   . Hyperkalemia 11/12/2015  . Acute on chronic respiratory failure with hypoxia ([place]) 11/12/2015  . ESRD on dialysis ([place])   . Chronic obstructive pulmonary disease ([place])   . Hypertensive urgency 09/20/2015  . Fecal incontinence   . Adverse effects of medication 06/21/2015  . Herpes 06/19/2015  . Skin lesion 06/16/2015  . Recurrent genital herpes 06/16/2015  . COPD exacerbation ([place]) 06/05/2015  . Onychocryptosis 04/08/2015  . Unstable gait 03/28/2015  . Renal mass   . Hypertension   . Inadequate pain control 12/30/2014  . CHF (congestive heart failure) ([place]) 12/30/2014  . Meralgia paresthetica of right side 12/26/2014  . Hereditary and idiopathic peripheral neuropathy 02/15/2013  . Dermatitis of face 08/19/2012  . HIP PAIN, BILATERAL 12/07/2008  . BLADDER PROLAPSE 11/17/2007  .  OSTEOARTHRITIS, GENERALIZED, MULTIPLE JOINTS 08/18/2007  . 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 Anxiety and depression 04/23/2007  . ANEMIA NEC 03/31/2007  . TOBACCO ABUSE 12/10/2006  . HIATAL HERNIA 12/10/2006  . Dysphagia 12/10/2006  . HYPERCHOLESTEROLEMIA 07/30/2006  . Gout, unspecified 07/30/2006  . Major depression in partial remission ([place]) 07/30/2006  . Hypertensive renal disease, malignant, with renal failure 07/30/2006  . GASTROESOPHAGEAL REFLUX, NO ESOPHAGITIS 07/30/2006    Past Surgical History:  Procedure Laterality Date  . A/V SHUNT INTERVENTION N/A 08/13/2017    Procedure: A/V SHUNT INTERVENTION;  Surgeon: Algernon Huxley, MD;  Location: [facility] CV LAB;  Service: Cardiovascular;  Laterality: N/A;  . A/V SHUNTOGRAM Left 08/13/2017   Procedure: A/V SHUNTOGRAM;  Surgeon: Algernon Huxley, MD;  Location: [facility];  Service: Cardiovascular;  Laterality: Left;  . ABDOMINAL HYSTERECTOMY    . ANAL RECTAL MANOMETRY N/A 07/25/2015   Procedure: ANO RECTAL MANOMETRY;  Surgeon: Mauri Pole, MD;  Location: [facility];  Service: Endoscopy;  Laterality: N/A;  . APPENDECTOMY    . AV FISTULA PLACEMENT Left 11/11/2012   Procedure: INSERTION OF ARTERIOVENOUS (AV) GORE-TEX GRAFT ARM;  Surgeon: Angelia Mould, MD;  Location: [facility];  Service: Vascular;  Laterality: Left;  . [place] REMOVAL Left 11/18/2012   Procedure: REMOVAL OF LEFT UPPER ARM ARTERIOVENOUS GORETEX GRAFT ([place]);  Surgeon: Angelia Mould, MD;  Location: [facility];  Service: Vascular;  Laterality: Left;  . CARDIAC CATHETERIZATION  2003   normal  . CHOLECYSTECTOMY     Open mid line incision  . frontal craniotomy  2002   indication = sinusitis  . INSERTION OF DIALYSIS CATHETER     Left  . PATCH ANGIOPLASTY Left 11/18/2012   Procedure: PATCH ANGIOPLASTY;  Surgeon: Angelia Mould, MD;  Location: [facility];  Service: Vascular;  Laterality: Left;  . PERIPHERAL VASCULAR CATHETERIZATION Left 03/08/2015   Procedure: A/V Shuntogram/Fistulagram;  Surgeon: Algernon Huxley, MD;  Location: [facility];  Service: Cardiovascular;  Laterality: Left;  . PERIPHERAL VASCULAR CATHETERIZATION Left 03/08/2015   Procedure: A/V Shunt Intervention;  Surgeon: Algernon Huxley, MD;  Location: [facility];  Service: Cardiovascular;  Laterality: Left;  . RECTAL ULTRASOUND N/A 10/05/2015   Procedure: ANAL ULTRASOUND WITH PROBE;  Surgeon: Leighton Ruff, MD;  Location: [facility];  Service: Endoscopy;  Laterality: N/A;  . REVISON OF ARTERIOVENOUS FISTULA  05/11/2012   Procedure: REVISON OF ARTERIOVENOUS  FISTULA;  Surgeon: Elam Dutch, MD;  Location: [facility];  Service: Vascular;  Laterality: Right;  . TUBAL LIGATION       OB History    Gravida  5   Para  3   Term  3   Preterm      AB      Living        SAB      TAB      Ectopic      Multiple      Live Births               Home Medications    Prior to Admission medications   Medication Sig Start Date End Date Taking? Authorizing Provider  albuterol (PROVENTIL HFA;VENTOLIN HFA) 108 (90 Base) MCG/ACT inhaler Inhale 2 puffs into the lungs every 4 (four) hours as needed for wheezing or shortness of breath. 03/11/17   Martinique, Betty G, MD  allopurinol (ZYLOPRIM) 100 MG tablet Take 100 mg by mouth once daily on non-dialysis  days (Tues/Thurs/Sat/Sun) Patient taking differently: Take 100 mg by mouth See admin instructions. Take one tablet (100 mg) by mouth once daily on non-dialysis days (Tues/Thurs/Sat/Sun) 11/13/15   Smiley Houseman, MD  amLODipine (NORVASC) 10 MG tablet TAKE 1 TABLET BY MOUTH AT BEDTIME Patient taking differently: TAKE 1 TABLET (10 MG) BY MOUTH AT BEDTIME 06/01/17   Martinique, Betty G, MD  aspirin EC 81 MG tablet Take 81 mg by mouth daily.     [provider]  atorvastatin (LIPITOR) 40 MG tablet Take 1 tablet (40 mg total) by mouth daily. Patient taking differently: Take 40 mg by mouth at bedtime.  02/26/15   Rumley, Chamberino N, DO  azelastine (ASTELIN) 0.1 % nasal spray Place 1 spray into both nostrils 2 (two) times daily. Use in each nostril as directed 08/07/16   Martinique, Betty G, MD  budesonide-formoterol [facility]) 160-4.5 MCG/ACT inhaler Inhale 2 puffs into the lungs 2 (two) times daily. 07/26/17   Dana Allan I, MD  cinacalcet (SENSIPAR) 60 MG tablet Take 60 mg by mouth daily.    [provider]  cinacalcet (SENSIPAR) 90 MG tablet Take 90 mg by mouth at bedtime.    [provider]  clotrimazole-betamethasone (LOTRISONE) cream Apply 1 application topically 2 (two) times  daily for 14 days. 09/09/17 09/23/17  Martinique, Betty G, MD  dicyclomine (BENTYL) 10 MG capsule Take 1 capsule (10 mg total) by mouth 4 (four) times daily -  before meals and at bedtime. 08/10/17   Zehr, Laban Emperor, PA-C  DULoxetine (CYMBALTA) 30 MG capsule Take 2 capsules (60 mg total) by mouth daily. Patient taking differently: Take 30 mg by mouth at bedtime.  01/08/17   Kathrynn Ducking, MD  fluconazole (DIFLUCAN) 150 MG tablet Repeat dose weekly x 2 09/08/17 09/22/17  Martinique, Betty G, MD  fluticasone [facility]) 50 MCG/ACT nasal spray Place 2 sprays into both nostrils daily as needed for allergies or rhinitis. 05/08/16   Rogue Bussing, MD  glycopyrrolate (ROBINUL) 2 MG tablet Take 1 tablet (2 mg total) by mouth 2 (two) times daily. 08/25/17   Ladene Artist, MD  lidocaine-prilocaine (EMLA) cream Apply 1 application topically See admin instructions. Apply topically one hour before dialysis - Monday, Wednesday, Friday    [provider]  loratadine (CLARITIN) 10 MG tablet 10 mg every other day. 08/04/17   Martinique, Betty G, MD  losartan (COZAAR) 100 MG tablet Take 1 tablet (100 mg total) by mouth daily. 11/12/16   Martinique, Betty G, MD  metoprolol succinate (TOPROL-XL) 100 MG 24 hr tablet TAKE 1 TABLET BY MOUTH TWICE DAILY Patient taking differently: TAKE 1 TABLET (100 MG)  BY MOUTH TWICE DAILY 03/27/17   Martinique, Betty G, MD  multivitamin (RENA-VIT) TABS tablet Take 1 tablet by mouth at bedtime. Patient taking differently: Take 1 tablet by mouth daily.  01/01/15   Haney, Yetta Flock A, MD  nicotine (NICODERM CQ - DOSED IN MG/24 HOURS) 21 mg/24hr patch Place 21 mg onto the skin daily.    [provider]  nystatin (MYCOSTATIN) 100000 UNIT/ML suspension TAKE 5 MLS BY MOUTH 4 TIMES A DAY SWISH AND SWALLOW 08/31/17   [provider]  pantoprazole (PROTONIX) 20 MG tablet Take 1 tablet (20 mg total) by mouth 2 (two) times daily before a meal. 09/08/17   Martinique, Betty G, MD  pregabalin (LYRICA)  100 MG capsule Take 100 mg by mouth at bedtime.     [provider]  sevelamer carbonate (RENVELA) 800  MG tablet Take 2,400 mg by mouth See admin instructions. Take 3 tablets (2400 mg) by mouth with each meal or snack - once or twice daily    [provider]    Family History Family History  Problem Relation Age of Onset  . Thyroid cancer Mother   . Heart disease Father   . Hypertension Father   . Heart attack Father   . Lupus Daughter   . Breast cancer Sister   . Thyroid cancer Sister   . Kidney disease Sister   . Colon cancer Neg Hx     Social History Social History   Tobacco Use  . Smoking status: Current Every Day Smoker    Packs/day: 1.00    Years: 60.00    Pack years: 60.00    Types: Cigarettes  . Smokeless tobacco: Never Used  . Tobacco comment: smoker since 77 yo.  1.5 ppd of [place] 100 lights. ; form given 10-23-16  Substance Use Topics  . Alcohol use: No    Alcohol/week: 0.0 oz  . Drug use: No    Comment: No hx of IV drug use     Allergies   Tuberculin tests; Valacyclovir; Codeine; Penicillins; and Sulfamethoxazole   Review of Systems Review of Systems  Constitutional: Negative for chills and fever.  Respiratory: Negative.  Negative for cough and shortness of breath.   Cardiovascular: Negative.  Negative for leg swelling.  Gastrointestinal: Negative.  Negative for nausea.  Genitourinary: Negative.   Musculoskeletal:       See HPI.  Skin: Negative.   Neurological: Negative.  Negative for numbness and headaches.     Physical Exam Updated Vital Signs BP (!) 157/50 (BP Location: Right Arm)   Pulse 95   Temp 98.6 F (37 C) (Oral)   Resp 18   Ht 5\' 1"  (1.549 m)   Wt 71.2 kg (157 lb)   SpO2 95%   BMI 29.66 kg/m   Physical Exam  Constitutional: She is oriented to person, place, and time. She appears well-developed and well-nourished.  HENT:  Head: Normocephalic.  Neck: Normal range of motion. Neck supple.  Cardiovascular:  Normal rate, regular rhythm and intact distal pulses.  Pulmonary/Chest: Effort normal and breath sounds normal. She has no wheezes. She has no rales.  Abdominal: Soft. Bowel sounds are normal. There is no tenderness. There is no rebound and no guarding.  Musculoskeletal: Normal range of motion.  No LE swelling, redness, discoloration or deformity.   Neurological: She is alert and oriented to person, place, and time. No sensory deficit.  LE's are hypersensitive to light touch.  Skin: Skin is warm and dry. No rash noted.  Psychiatric: She has a normal mood and affect.     ED Treatments / Results  Labs (all labs ordered are listed, but only abnormal results are displayed) Labs Reviewed - No data to display  EKG None  Radiology No results found.  Procedures Procedures (including critical care time)  Medications Ordered in ED Medications  oxyCODONE-acetaminophen (PERCOCET/ROXICET) 5-325 MG per tablet 2 tablet (2 tablets Oral Given 09/13/17 2333)     Initial Impression / Assessment and Plan / ED Course  I have reviewed the triage vital signs and the nursing notes.  Pertinent labs & imaging results that were available during my care of the patient were reviewed by me and considered in my medical decision making (see chart for details).     Patient here for uncontrolled neuropathic pain in bilateral LE's, specifically in her  feet. She has been unable to sleep due to pain. She reiterates that this pain is typical of neuropathic pain.  Percocet provided with significant relief. Feel she can be discharged home and should follow up with her doctor tomorrow for further evaluation and management of recurrent LE pain.  Final Clinical Impressions(s) / ED Diagnoses   Final diagnoses:  None   1. Lower extremity pain 2. History of neuropathy  ED Discharge Orders    None       Charlann Lange, PA-C 09/14/17 [id number]    Veryl Speak, MD 09/14/17 [phone]

## 2017-09-14 DIAGNOSIS — Z992 Dependence on renal dialysis: Secondary | ICD-10-CM | POA: Diagnosis not present

## 2017-09-14 DIAGNOSIS — N186 End stage renal disease: Secondary | ICD-10-CM | POA: Diagnosis not present

## 2017-09-14 DIAGNOSIS — D631 Anemia in chronic kidney disease: Secondary | ICD-10-CM | POA: Diagnosis not present

## 2017-09-14 DIAGNOSIS — N2581 Secondary hyperparathyroidism of renal origin: Secondary | ICD-10-CM | POA: Diagnosis not present

## 2017-09-14 MED ORDER — OXYCODONE-ACETAMINOPHEN 5-325 MG PO TABS: 1.0000 | ORAL_TABLET | ORAL | 0 refills | Status: DC | PRN

## 2017-09-16 ENCOUNTER — Ambulatory Visit: Payer: Medicare Other | Admitting: Family Medicine

## 2017-09-16 ENCOUNTER — Encounter: Payer: Self-pay | Admitting: Family Medicine

## 2017-09-16 ENCOUNTER — Telehealth: Payer: Self-pay | Admitting: Family Medicine

## 2017-09-16 ENCOUNTER — Ambulatory Visit (INDEPENDENT_AMBULATORY_CARE_PROVIDER_SITE_OTHER): Payer: Medicare Other | Admitting: Family Medicine

## 2017-09-16 ENCOUNTER — Telehealth: Payer: Self-pay | Admitting: Adult Health

## 2017-09-16 VITALS — BP 132/84 | HR 62 | Temp 98.5°F | Resp 12 | Ht 61.0 in | Wt 160.1 lb

## 2017-09-16 DIAGNOSIS — F419 Anxiety disorder, unspecified: Secondary | ICD-10-CM

## 2017-09-16 DIAGNOSIS — Z992 Dependence on renal dialysis: Secondary | ICD-10-CM | POA: Diagnosis not present

## 2017-09-16 DIAGNOSIS — L299 Pruritus, unspecified: Secondary | ICD-10-CM | POA: Diagnosis not present

## 2017-09-16 DIAGNOSIS — F329 Major depressive disorder, single episode, unspecified: Secondary | ICD-10-CM

## 2017-09-16 DIAGNOSIS — N186 End stage renal disease: Secondary | ICD-10-CM | POA: Diagnosis not present

## 2017-09-16 LAB — HEPATIC FUNCTION PANEL
ALBUMIN: 3.2 g/dL — AB (ref 3.5–5.2)
ALK PHOS: 70 U/L (ref 39–117)
ALT: 10 U/L (ref 0–35)
AST: 17 U/L (ref 0–37)
BILIRUBIN DIRECT: 0.1 mg/dL (ref 0.0–0.3)
TOTAL PROTEIN: 6.6 g/dL (ref 6.0–8.3)
Total Bilirubin: 0.4 mg/dL (ref 0.2–1.2)

## 2017-09-16 MED ORDER — HYDROXYZINE HCL 25 MG PO TABS: 25.0000 mg | ORAL_TABLET | Freq: Three times a day (TID) | ORAL | 1 refills | Status: DC | PRN

## 2017-09-16 MED ORDER — PRAMOXINE-BENZYL ALCOHOL 1-10 % EX GEL: 1.0000 "application " | Freq: Three times a day (TID) | CUTANEOUS | 0 refills | Status: DC | PRN

## 2017-09-16 MED ORDER — HYDROXYZINE HCL 25 MG PO TABS: 25.0000 mg | ORAL_TABLET | Freq: Three times a day (TID) | ORAL | 1 refills | Status: AC | PRN

## 2017-09-16 MED ORDER — PRAMOXINE HCL 1 % EX GEL: 1.0000 "application " | Freq: Three times a day (TID) | CUTANEOUS | 1 refills | Status: DC | PRN

## 2017-09-16 NOTE — Telephone Encounter (Signed)
Daughter called back  States she is at pharmacy  And they are waiting for something else to be called in  To replace the pramoxine gel

## 2017-09-16 NOTE — Patient Instructions (Signed)
A few things to remember from today's visit:   Generalized pruritus - Plan: Hepatic function panel, hydrOXYzine (ATARAX/VISTARIL) 25 MG tablet, Pramoxine HCl 1 % GEL   Please be sure medication list is accurate. If a new problem present, please set up appointment sooner than planned today.

## 2017-09-16 NOTE — Addendum Note (Signed)
Addended by: Martinique, Breane Grunwald G on: 09/16/2017 04:53 PM   Modules accepted: Orders

## 2017-09-16 NOTE — Telephone Encounter (Signed)
I called the patient, left a message, I will call back later. 

## 2017-09-16 NOTE — Telephone Encounter (Signed)
Copied from [place] [id number]. Topic: Quick Communication - See Telephone Encounter >> Sep 16, 2017 10:04 AM Corie Chiquito, NT wrote: CRM for notification. See Telephone encounter for: 09/16/17. Patients daughter is calling because she would like to speak with Dr. Martinique or her nurse about her mother. Stated that she hasn't been doing well since this past Sunday and she feels so bad that she didn't make it to dialysis this morning. If someone could give her a call back about this at [phone]

## 2017-09-16 NOTE — Telephone Encounter (Signed)
The patient claims that she has had some problems with itching of the skin that has gradually worsened over time over the last month, she has been placed on several new medications.  She will go to her primary doctor today at 2 PM.  She also reports increased discomfort from her neuropathy.  She is on Lyrica taking 100 mg at night, but she has significant renal insufficiency and likely cannot go higher on the dose.  The patient is on Cymbalta taking 30 mg twice daily, but the patient may only be taking 1 a day, if this is the case, she is to go to the 30 mg twice daily regimen to see if we can help the neuropathy discomfort.

## 2017-09-16 NOTE — Telephone Encounter (Signed)
Spoke with pharmacist and she stated that the alternative would Itching X gel 1%

## 2017-09-16 NOTE — Telephone Encounter (Signed)
Spoke with [name] and she stated that her skin has been burning and itching since last week. She stated that she went to the ER on Sunday and they gave her Percocet, took one last night, but her sx are not getting better. Patient coming in at 2 pm to see PCP.

## 2017-09-16 NOTE — Progress Notes (Addendum)
ACUTE VISIT   HPI:  Chief Complaint  Patient presents with  . Skin irritation    burning and itchy skin since last week, went to ER on Sunday    [name] is a 77 y.o. female, who is here today with her daughter complaining of generalized skin pruritus and burning sensation, she has not noted any rash.  When asked about new medications or new exposures, she states that she is taking "a lot" of new medications. According to pt, she was in the ER on 09/13/2017, she was discharged on Percocet, which she tells me has not helped.  She has not identified exacerbating or alleviating factors. It is constant and it seems to be worse at night, keeping her from sleep.  She also suggests that  this could be an "allergic reaction" to one of the medications she has received.  Her daughter mentions Diflucan I prescribed last visit, she took it prior to the visit and it was well-tolerated. She also mentioned that she had a abdominal CT with contrast, which was on 08/20/2017.   GI cocktail and morphine in the ER   I saw her last on 09/08/2017, at that time her main complaint was vaginal pruritus and skin lesion but as I remember she mentioned she was having pruritus all over with no rash.  She stated initially that she did not have this problem when I saw her last but later on she states that she has had this problem intermittently for about 1.5 months.   Lab Results  Component Value Date   ALT 13 (L) 08/20/2017   AST 21 08/20/2017   ALKPHOS 72 08/20/2017   BILITOT 0.5 08/20/2017    ESRD: She missed dialysis today.  Lab Results  Component Value Date   CREATININE 7.13 (H) 08/20/2017   BUN 16 08/20/2017   NA 135 08/20/2017   K 4.7 08/20/2017   CL 93 (L) 08/20/2017   CO2 26 08/20/2017    She has history of anxiety and depression/mood disorder, currently she is on Cymbalta 30 mg, which was also prescribed to help with neuropathic pain.  She was on Abilify, which was discontinued  after hospitalization a few months ago. According to her daughter, she has not been able to sleep, which she attributes to intense pruritus.  Hx of peripheral neuropathy, she is also on Lyrica 100 mg daily. Still smoking, she is not on Chantix or nicotine patches.   Review of Systems  Constitutional: Positive for appetite change and fatigue. Negative for chills and fever.  HENT: Negative for facial swelling, mouth sores and sore throat.   Respiratory: Negative for cough, shortness of breath and wheezing.   Cardiovascular: Negative for leg swelling.  Gastrointestinal: Negative for abdominal pain, nausea and vomiting.       Changes in bowel habits.  Genitourinary: Negative for flank pain and genital sores.  Musculoskeletal: Positive for arthralgias and myalgias.  Skin: Negative for rash and wound.  Allergic/Immunologic: Positive for environmental allergies.  Neurological: Negative for seizures and headaches.  Psychiatric/Behavioral: Positive for sleep disturbance. Negative for confusion. The patient is nervous/anxious.       Current Outpatient Medications on File Prior to Visit  Medication Sig Dispense Refill  . albuterol (PROVENTIL HFA;VENTOLIN HFA) 108 (90 Base) MCG/ACT inhaler Inhale 2 puffs into the lungs every 4 (four) hours as needed for wheezing or shortness of breath. 1 Inhaler 1  . allopurinol (ZYLOPRIM) 100 MG tablet Take 100 mg by mouth  once daily on non-dialysis days (Tues/Thurs/Sat/Sun) (Patient taking differently: Take 100 mg by mouth See admin instructions. Take one tablet (100 mg) by mouth once daily on non-dialysis days (Tues/Thurs/Sat/Sun)) 30 tablet 0  . amLODipine (NORVASC) 10 MG tablet TAKE 1 TABLET BY MOUTH AT BEDTIME (Patient taking differently: TAKE 1 TABLET (10 MG) BY MOUTH AT BEDTIME) 90 tablet 1  . aspirin EC 81 MG tablet Take 81 mg by mouth daily.     Marland Kitchen atorvastatin (LIPITOR) 40 MG tablet Take 1 tablet (40 mg total) by mouth daily. (Patient taking differently:  Take 40 mg by mouth at bedtime. ) 90 tablet 3  . azelastine (ASTELIN) 0.1 % nasal spray Place 1 spray into both nostrils 2 (two) times daily. Use in each nostril as directed 30 mL 6  . budesonide-formoterol (SYMBICORT) 160-4.5 MCG/ACT inhaler Inhale 2 puffs into the lungs 2 (two) times daily. 1 Inhaler 12  . cinacalcet (SENSIPAR) 60 MG tablet Take 60 mg by mouth daily.    . cinacalcet (SENSIPAR) 90 MG tablet Take 90 mg by mouth at bedtime.    . clotrimazole-betamethasone (LOTRISONE) cream Apply 1 application topically 2 (two) times daily for 14 days. 45 g 0  . dicyclomine (BENTYL) 10 MG capsule Take 1 capsule (10 mg total) by mouth 4 (four) times daily -  before meals and at bedtime. 120 capsule 1  . DULoxetine (CYMBALTA) 30 MG capsule Take 2 capsules (60 mg total) by mouth daily. (Patient taking differently: Take 30 mg by mouth at bedtime. ) 180 capsule 3  . fluticasone (FLONASE) 50 MCG/ACT nasal spray Place 2 sprays into both nostrils daily as needed for allergies or rhinitis. 16 g 3  . glycopyrrolate (ROBINUL) 2 MG tablet Take 1 tablet (2 mg total) by mouth 2 (two) times daily. 60 tablet 11  . lidocaine-prilocaine (EMLA) cream Apply 1 application topically See admin instructions. Apply topically one hour before dialysis - Monday, Wednesday, Friday    . loratadine (CLARITIN) 10 MG tablet 10 mg every other day. 45 tablet 1  . losartan (COZAAR) 100 MG tablet Take 1 tablet (100 mg total) by mouth daily. 90 tablet 1  . metoprolol succinate (TOPROL-XL) 100 MG 24 hr tablet TAKE 1 TABLET BY MOUTH TWICE DAILY (Patient taking differently: TAKE 1 TABLET (100 MG)  BY MOUTH TWICE DAILY) 180 tablet 1  . multivitamin (RENA-VIT) TABS tablet Take 1 tablet by mouth at bedtime. (Patient taking differently: Take 1 tablet by mouth daily. ) 60 tablet 5  . nicotine (NICODERM CQ - DOSED IN MG/24 HOURS) 21 mg/24hr patch Place 21 mg onto the skin daily.    Marland Kitchen nystatin (MYCOSTATIN) 100000 UNIT/ML suspension TAKE 5 MLS BY  MOUTH 4 TIMES A DAY SWISH AND SWALLOW  0  . oxyCODONE-acetaminophen (PERCOCET/ROXICET) 5-325 MG tablet Take 1 tablet by mouth every 4 (four) hours as needed for severe pain. 8 tablet 0  . pantoprazole (PROTONIX) 20 MG tablet Take 1 tablet (20 mg total) by mouth 2 (two) times daily before a meal. 60 tablet 1  . pregabalin (LYRICA) 100 MG capsule Take 100 mg by mouth at bedtime.     . sevelamer carbonate (RENVELA) 800 MG tablet Take 2,400 mg by mouth See admin instructions. Take 3 tablets (2400 mg) by mouth with each meal or snack - once or twice daily     No current facility-administered medications on file prior to visit.      Past Medical History:  Diagnosis Date  . Abnormality of gait  03/28/2015  . Adrenal mass ([place])   . 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 Arthritis   . Back pain   . CHF (congestive heart failure) ([place])   . Chronic kidney disease    Hemo MWF  . Congestion of throat    Pt states she has a lot mucus in back throat.  . Constipation   . COPD (chronic obstructive pulmonary disease) ([place])   . Depression   . Dialysis patient [facility])    kidney  . Diverticulitis   . GERD (gastroesophageal reflux disease)   . H/O hiatal hernia   . High cholesterol   . Hoarseness of voice   . Hyperlipidemia   . Hypertension   . IBS (irritable bowel syndrome)   . Meralgia paresthetica of right side 12/26/2014  . Normal cardiac stress test 12/24/2009   lexiscan, imaging normal  . Renal disorder   . RLS (restless legs syndrome)   . Seizures ([place])    2004 past brain surgery  . Sinus complaint   . Thyroid disease   . TIA (transient ischemic attack)   . Tubular adenoma of colon 01/2008   Allergies  Allergen Reactions  . Tuberculin Tests Hives    "blisters"  . Valacyclovir Other (See Comments)    Confusion and nervousness  . Codeine Nausea And Vomiting  . Penicillins Rash    No problems breathing. Has tolerated omnicef in past without  issue Has patient had a PCN reaction causing immediate rash, facial/tongue/throat swelling, SOB or lightheadedness with hypotension: Yes Has patient had a PCN reaction causing severe rash involving mucus membranes or skin necrosis: No Has patient had a PCN reaction that required hospitalization No Has patient had a PCN reaction occurring within the last 10 years: No If all of the above answers are "NO", then may proceed with Cephalosporin  . Sulfamethoxazole Rash    Social History   Socioeconomic History  . Marital status: Legally Separated    Spouse name: Not on file  . Number of children: 3  . Years of education: 95  . Highest education level: Not on file  Occupational History  . Occupation: retired  Scientific laboratory technician  . Financial resource strain: Not on file  . Food insecurity:    Worry: Not on file    Inability: Not on file  . Transportation needs:    Medical: Not on file    Non-medical: Not on file  Tobacco Use  . Smoking status: Current Every Day Smoker    Packs/day: 1.00    Years: 60.00    Pack years: 60.00    Types: Cigarettes  . Smokeless tobacco: Never Used  . Tobacco comment: smoker since 77 yo.  1.5 ppd of [place] 100 lights. ; form given 10-23-16  Substance and Sexual Activity  . Alcohol use: No    Alcohol/week: 0.0 oz  . Drug use: No    Comment: No hx of IV drug use  . Sexual activity: Never    Birth control/protection: None  Lifestyle  . Physical activity:    Days per week: Not on file    Minutes per session: Not on file  . Stress: Not on file  Relationships  . Social connections:    Talks on phone: Not on file    Gets together: Not on file    Attends religious service: Not on file    Active member of club or organization: Not on file  Attends meetings of clubs or organizations: Not on file    Relationship status: Not on file  Other Topics Concern  . Not on file  Social History Narrative   ** Merged History Encounter **       Lives in [place]  with Daughter, granddaughters (twins) and mentally challenged niece.   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   Patient has a college education.   Patient drinks one cup of caffeine daily.             Vitals:   09/16/17 1405  BP: 132/84  Pulse: 62  Resp: 12  Temp: 98.5 F (36.9 C)  SpO2: 97%   Body mass index is 30.26 kg/m.   Physical Exam  Nursing note and vitals reviewed. Constitutional: She is oriented to person, place, and time. She appears well-developed. She appears distressed (due to pruritus).  HENT:  Head: Normocephalic and atraumatic.  Mouth/Throat: Oropharynx is clear and moist and mucous membranes are normal.  Eyes: Conjunctivae are normal. No scleral icterus.  Cardiovascular: Normal rate and regular rhythm.  No murmur heard. Respiratory: Effort normal and breath sounds normal. No respiratory distress.  GI: Soft. She exhibits no mass. There is no tenderness.  Musculoskeletal: She exhibits no edema or tenderness.  Lymphadenopathy:    She has no cervical adenopathy.  Neurological: She is alert and oriented to person, place, and time. She has normal strength.  Skin: Skin is warm. No rash noted. No erythema.  Psychiatric: Her mood appears anxious. She is agitated. She is not actively hallucinating.  Rocking back and forth from her chair, slapping her skin (chest, abdomen, and thighs), and pulling her hair.  Poor groomed, poor eye contact.    ASSESSMENT AND PLAN:   [name] was seen today for skin irritation.  Diagnoses and all orders for this visit:  Generalized pruritus  We discussed possible etiologies: Allergic,psychiatric,and uremic among some. After discussion of treatment options, I recommended hydroxyzine.  We discussed side effects of the medication, including the risk of falls due to drowsiness, she was strongly recommended not to take more than prescribed. Adequate skin hydration. Topical pramoxine might help.  We review her  current medications and most are not new.  Reviewing ED visit, she was evaluated because left hip pain, it is why Percocet was prescribed.  Explained to patient that Percocet was not to treat skin pruritus.  -     Hepatic function panel -     Pramoxine HCl 1 % GEL; Apply 1 application topically 3 (three) times daily as needed. -     hydrOXYzine (ATARAX/VISTARIL) 25 MG tablet; Take 1 tablet (25 mg total) by mouth every 8 (eight) hours as needed for itching.  ESRD on dialysis [facility])  This problem could be contributing/causing his skin pruritus. She missed dialysis today, her daughter is not sure if she will go back tomorrow. I will try to contact her nephrologist, Dr.Colonato, and see if he has further recommendations.  Anxiety and depression  She has episodes of agitation, moving all the time and making vocal noises (like moaning).  But other times she is participating of interrogation. Insomnia attributed to skin pruritus but maniac episode also needs to be considered. Hydroxyzine might help with sleep.      Betty G. Martinique, MD  [facility]. [place] [facility].

## 2017-09-16 NOTE — Telephone Encounter (Signed)
Copied from [place] [phone]. Topic: Quick Communication - See Telephone Encounter >> Sep 16, 2017  3:40 PM Synthia Innocent wrote: CRM for notification. See Telephone encounter for: 09/16/17. Patient daughter calling and stating that pharmacy states that Pramoxine HCl 1 % GEL is no longer available. Please advise

## 2017-09-16 NOTE — Telephone Encounter (Signed)
Pt's daughter called said the pt is experiencing itching , burning and she is "digging in her skin" since leaving the hospital. She said the pt was put on 5 new medications at the hospital.  The patient was requesting to see Dr Jannifer Franklin today for these symptoms. In talking with the daughter all the medications came from PCP and I advised her she should call the PCP as it seemed the pt was having an allergic reaction to one of the medications. She said she was waiting for a call back from the PCP office. While talking with her she got a call from a "[facility]" facility and our call ended.   Pt said she was experiencing leg pain, spasms and pain in the feet and felt like the lyrica was not helping her neuropathy. She is asking for a call back in regards to this.

## 2017-09-18 DIAGNOSIS — D631 Anemia in chronic kidney disease: Secondary | ICD-10-CM | POA: Diagnosis not present

## 2017-09-18 DIAGNOSIS — Z992 Dependence on renal dialysis: Secondary | ICD-10-CM | POA: Diagnosis not present

## 2017-09-18 DIAGNOSIS — N2581 Secondary hyperparathyroidism of renal origin: Secondary | ICD-10-CM | POA: Diagnosis not present

## 2017-09-18 DIAGNOSIS — N186 End stage renal disease: Secondary | ICD-10-CM | POA: Diagnosis not present

## 2017-09-21 ENCOUNTER — Other Ambulatory Visit (INDEPENDENT_AMBULATORY_CARE_PROVIDER_SITE_OTHER): Payer: Self-pay | Admitting: Vascular Surgery

## 2017-09-21 DIAGNOSIS — N185 Chronic kidney disease, stage 5: Secondary | ICD-10-CM

## 2017-09-21 DIAGNOSIS — Z992 Dependence on renal dialysis: Secondary | ICD-10-CM | POA: Diagnosis not present

## 2017-09-21 DIAGNOSIS — D631 Anemia in chronic kidney disease: Secondary | ICD-10-CM | POA: Diagnosis not present

## 2017-09-21 DIAGNOSIS — N2581 Secondary hyperparathyroidism of renal origin: Secondary | ICD-10-CM | POA: Diagnosis not present

## 2017-09-21 DIAGNOSIS — N186 End stage renal disease: Secondary | ICD-10-CM | POA: Diagnosis not present

## 2017-09-22 ENCOUNTER — Encounter (INDEPENDENT_AMBULATORY_CARE_PROVIDER_SITE_OTHER): Payer: Self-pay | Admitting: Vascular Surgery

## 2017-09-22 ENCOUNTER — Ambulatory Visit (INDEPENDENT_AMBULATORY_CARE_PROVIDER_SITE_OTHER): Payer: Medicare Other

## 2017-09-22 ENCOUNTER — Ambulatory Visit (INDEPENDENT_AMBULATORY_CARE_PROVIDER_SITE_OTHER): Payer: Medicare Other | Admitting: Vascular Surgery

## 2017-09-22 VITALS — BP 188/75 | HR 76 | Resp 16 | Ht 61.0 in | Wt 158.6 lb

## 2017-09-22 DIAGNOSIS — N185 Chronic kidney disease, stage 5: Secondary | ICD-10-CM

## 2017-09-22 DIAGNOSIS — Z992 Dependence on renal dialysis: Secondary | ICD-10-CM

## 2017-09-22 DIAGNOSIS — N186 End stage renal disease: Secondary | ICD-10-CM | POA: Diagnosis not present

## 2017-09-22 DIAGNOSIS — D6489 Other specified anemias: Secondary | ICD-10-CM

## 2017-09-22 NOTE — Progress Notes (Signed)
Subjective:    Patient ID: [name], female    DOB: 06-27-1940, 77 y.o.   MRN: [id number] Chief Complaint  Patient presents with  . Follow-up    HDA ultrasound   Patient presents for her first post procedure follow-up.  The patient underwent a left upper extremity shuntogram on August 13, 2017.  The patient presents today without complaint with the exception of the dialysis staff sticking her in the "same spots". The patient underwent a duplex ultrasound of the AV access which was notable for a patent graft without any significant hemodynamic stenosis. Hemodialysis doppler flow is 1642. The patient denies any issues with hemodialysis such as cannulation problems, increased bleeding, decrease in doppler flow or recirculation. The patient also denies any graft skin breakdown, pain, edema, pallor or ulceration of the arm / hand.  Review of Systems  Constitutional: Negative.   HENT: Negative.   Eyes: Negative.   Respiratory: Negative.   Cardiovascular: Negative.   Gastrointestinal: Negative.   Endocrine: Negative.   Genitourinary:       ESRD  Musculoskeletal: Negative.   Skin: Negative.   Allergic/Immunologic: Negative.   Neurological: Negative.   Hematological: Negative.   Psychiatric/Behavioral: Negative.       Objective:   Physical Exam  Constitutional: She is oriented to person, place, and time. She appears well-developed and well-nourished.  HENT:  Head: Normocephalic and atraumatic.  Right Ear: External ear normal.  Left Ear: External ear normal.  Eyes: Pupils are equal, round, and reactive to light. Conjunctivae and EOM are normal.  Neck: Normal range of motion.  Cardiovascular: Normal rate, regular rhythm, normal heart sounds and intact distal pulses.  Pulses:      Radial pulses are 2+ on the right side, and 2+ on the left side.  Left upper extremity hero graft: Bruit and thrill.  Skin is intact.  Pulmonary/Chest: Effort normal.  Musculoskeletal: Normal range of  motion.  Neurological: She is alert and oriented to person, place, and time.  Skin: Skin is warm and dry.  Psychiatric: She has a normal mood and affect. Her behavior is normal. Judgment and thought content normal.  Vitals reviewed.  BP (!) 188/75 (BP Location: Right Arm)   Pulse 76   Resp 16   Ht 5\' 1"  (1.549 m)   Wt 158 lb 9.6 oz (71.9 kg)   BMI 29.97 kg/m   Past Medical History:  Diagnosis Date  . Abnormality of gait 03/28/2015  . Adrenal mass ([place])   . 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 Arthritis   . Back pain   . CHF (congestive heart failure) ([place])   . Chronic kidney disease    Hemo MWF  . Congestion of throat    Pt states she has a lot mucus in back throat.  . Constipation   . COPD (chronic obstructive pulmonary disease) ([place])   . Depression   . Dialysis patient [facility]    kidney  . Diverticulitis   . GERD (gastroesophageal reflux disease)   . H/O hiatal hernia   . High cholesterol   . Hoarseness of voice   . Hyperlipidemia   . Hypertension   . IBS (irritable bowel syndrome)   . Meralgia paresthetica of right side 12/26/2014  . Normal cardiac stress test 12/24/2009   lexiscan, imaging normal  . Renal disorder   . RLS (restless legs syndrome)   . Seizures ([place])    2004 past  brain surgery  . Sinus complaint   . Thyroid disease   . TIA (transient ischemic attack)   . Tubular adenoma of colon 01/2008   Social History   Socioeconomic History  . Marital status: Legally Separated    Spouse name: Not on file  . Number of children: 3  . Years of education: 59  . Highest education level: Not on file  Occupational History  . Occupation: retired  Scientific laboratory technician  . Financial resource strain: Not on file  . Food insecurity:    Worry: Not on file    Inability: Not on file  . Transportation needs:    Medical: Not on file    Non-medical: Not on file  Tobacco Use  . Smoking status: Current Every Day Smoker    Packs/day:  1.00    Years: 60.00    Pack years: 60.00    Types: Cigarettes  . Smokeless tobacco: Never Used  . Tobacco comment: smoker since 77 yo.  1.5 ppd of [place] 100 lights. ; form given 10-23-16  Substance and Sexual Activity  . Alcohol use: No    Alcohol/week: 0.0 oz  . Drug use: No    Comment: No hx of IV drug use  . Sexual activity: Never    Birth control/protection: None  Lifestyle  . Physical activity:    Days per week: Not on file    Minutes per session: Not on file  . Stress: Not on file  Relationships  . Social connections:    Talks on phone: Not on file    Gets together: Not on file    Attends religious service: Not on file    Active member of club or organization: Not on file    Attends meetings of clubs or organizations: Not on file    Relationship status: Not on file  . Intimate partner violence:    Fear of current or ex partner: Not on file    Emotionally abused: Not on file    Physically abused: Not on file    Forced sexual activity: Not on file  Other Topics Concern  . Not on file  Social History Narrative   ** Merged History Encounter **       Lives in [place] with Daughter, granddaughters (twins) and mentally challenged niece.   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   Patient has a college education.   Patient drinks one cup of caffeine daily.            Past Surgical History:  Procedure Laterality Date  . A/V SHUNT INTERVENTION N/A 08/13/2017   Procedure: A/V SHUNT INTERVENTION;  Surgeon: Algernon Huxley, MD;  Location: [facility];  Service: Cardiovascular;  Laterality: N/A;  . A/V SHUNTOGRAM Left 08/13/2017   Procedure: A/V SHUNTOGRAM;  Surgeon: Algernon Huxley, MD;  Location: [place] [facility];  Service: Cardiovascular;  Laterality: Left;  . ABDOMINAL HYSTERECTOMY    . ANAL RECTAL MANOMETRY N/A 07/25/2015   Procedure: ANO RECTAL MANOMETRY;  Surgeon: Mauri Pole, MD;  Location: [facility];  Service: Endoscopy;  Laterality:  N/A;  . APPENDECTOMY    . AV FISTULA PLACEMENT Left 11/11/2012   Procedure: INSERTION OF ARTERIOVENOUS (AV) GORE-TEX GRAFT ARM;  Surgeon: Angelia Mould, MD;  Location: [place];  Service: Vascular;  Laterality: Left;  . [place] REMOVAL Left 11/18/2012   Procedure: REMOVAL OF LEFT UPPER ARM ARTERIOVENOUS GORETEX GRAFT ([place]);  Surgeon: Angelia Mould, MD;  Location: [facility]  OR;  Service: Vascular;  Laterality: Left;  . CARDIAC CATHETERIZATION  2003   normal  . CHOLECYSTECTOMY     Open mid line incision  . frontal craniotomy  2002   indication = sinusitis  . INSERTION OF DIALYSIS CATHETER     Left  . PATCH ANGIOPLASTY Left 11/18/2012   Procedure: PATCH ANGIOPLASTY;  Surgeon: Angelia Mould, MD;  Location: [place];  Service: Vascular;  Laterality: Left;  . PERIPHERAL VASCULAR CATHETERIZATION Left 03/08/2015   Procedure: A/V Shuntogram/Fistulagram;  Surgeon: Algernon Huxley, MD;  Location: [place] [facility];  Service: Cardiovascular;  Laterality: Left;  . PERIPHERAL VASCULAR CATHETERIZATION Left 03/08/2015   Procedure: A/V Shunt Intervention;  Surgeon: Algernon Huxley, MD;  Location: [place] [facility];  Service: Cardiovascular;  Laterality: Left;  . RECTAL ULTRASOUND N/A 10/05/2015   Procedure: ANAL ULTRASOUND WITH PROBE;  Surgeon: Leighton Ruff, MD;  Location: [facility];  Service: Endoscopy;  Laterality: N/A;  . REVISON OF ARTERIOVENOUS FISTULA  05/11/2012   Procedure: REVISON OF ARTERIOVENOUS FISTULA;  Surgeon: Elam Dutch, MD;  Location: [place];  Service: Vascular;  Laterality: Right;  . TUBAL LIGATION     Family History  Problem Relation Age of Onset  . Thyroid cancer Mother   . Heart disease Father   . Hypertension Father   . Heart attack Father   . Lupus Daughter   . Breast cancer Sister   . Thyroid cancer Sister   . Kidney disease Sister   . Colon cancer Neg Hx    Allergies  Allergen Reactions  . Tuberculin Tests Hives    "blisters"  . Valacyclovir Other (See  Comments)    Confusion and nervousness  . Codeine Nausea And Vomiting  . Penicillins Rash    No problems breathing. Has tolerated omnicef in past without issue Has patient had a PCN reaction causing immediate rash, facial/tongue/throat swelling, SOB or lightheadedness with hypotension: Yes Has patient had a PCN reaction causing severe rash involving mucus membranes or skin necrosis: No Has patient had a PCN reaction that required hospitalization No Has patient had a PCN reaction occurring within the last 10 years: No If all of the above answers are "NO", then may proceed with Cephalosporin  . Sulfamethoxazole Rash      Assessment & Plan:  Patient presents for her first post procedure follow-up.  The patient underwent a left upper extremity shuntogram on August 13, 2017.  The patient presents today without complaint with the exception of the dialysis staff sticking her in the "same spots". The patient underwent a duplex ultrasound of the AV access which was notable for a patent graft without any significant hemodynamic stenosis. Hemodialysis doppler flow is 1642. The patient denies any issues with hemodialysis such as cannulation problems, increased bleeding, decrease in doppler flow or recirculation. The patient also denies any graft skin breakdown, pain, edema, pallor or ulceration of the arm / hand.  1. ESRD on dialysis ([facility] [place]) - Stable Studies reviewed with patient. The patient is doing well and currently has adequate dialysis access. Duplex ultrasound of the AV access shows a patent access with no evidence of hemodynamically significant strictures or stenosis.  The patient should continue to have duplex ultrasounds of the dialysis access every six months. The patient was instructed to call the office in the interim if any issues with dialysis access / doppler flow, pain, edema, pallor, fistula skin breakdown or ulceration of the arm / hand occur. The patient expressed their understanding.  -  VAS US DUPLEX DIALYSIS ACCESS (AVF,AVG); Future  2. ANEMIA NEC - Stable Patient presents asymptomatically Is followed by her nephrologist and primary care physician  Current Outpatient Medications on File Prior to Visit  Medication Sig Dispense Refill  . albuterol (PROVENTIL HFA;VENTOLIN HFA) 108 (90 Base) MCG/ACT inhaler Inhale 2 puffs into the lungs every 4 (four) hours as needed for wheezing or shortness of breath. 1 Inhaler 1  . allopurinol (ZYLOPRIM) 100 MG tablet Take 100 mg by mouth once daily on non-dialysis days (Tues/Thurs/Sat/Sun) (Patient taking differently: Take 100 mg by mouth See admin instructions. Take one tablet (100 mg) by mouth once daily on non-dialysis days (Tues/Thurs/Sat/Sun)) 30 tablet 0  . amLODipine (NORVASC) 10 MG tablet TAKE 1 TABLET BY MOUTH AT BEDTIME (Patient taking differently: TAKE 1 TABLET (10 MG) BY MOUTH AT BEDTIME) 90 tablet 1  . aspirin EC 81 MG tablet Take 81 mg by mouth daily.     Marland Kitchen atorvastatin (LIPITOR) 40 MG tablet Take 1 tablet (40 mg total) by mouth daily. (Patient taking differently: Take 40 mg by mouth at bedtime. ) 90 tablet 3  . azelastine (ASTELIN) 0.1 % nasal spray Place 1 spray into both nostrils 2 (two) times daily. Use in each nostril as directed 30 mL 6  . budesonide-formoterol (SYMBICORT) 160-4.5 MCG/ACT inhaler Inhale 2 puffs into the lungs 2 (two) times daily. 1 Inhaler 12  . cinacalcet (SENSIPAR) 90 MG tablet Take 90 mg by mouth at bedtime.    . clotrimazole-betamethasone (LOTRISONE) cream Apply 1 application topically 2 (two) times daily for 14 days. 45 g 0  . dicyclomine (BENTYL) 10 MG capsule Take 1 capsule (10 mg total) by mouth 4 (four) times daily -  before meals and at bedtime. 120 capsule 1  . DULoxetine (CYMBALTA) 30 MG capsule Take 2 capsules (60 mg total) by mouth daily. (Patient taking differently: Take 30 mg by mouth at bedtime. ) 180 capsule 3  . fluticasone (FLONASE) 50 MCG/ACT nasal spray Place 2 sprays into both nostrils  daily as needed for allergies or rhinitis. 16 g 3  . glycopyrrolate (ROBINUL) 2 MG tablet Take 1 tablet (2 mg total) by mouth 2 (two) times daily. 60 tablet 11  . hydrOXYzine (ATARAX/VISTARIL) 25 MG tablet Take 1 tablet (25 mg total) by mouth every 8 (eight) hours as needed for itching. 30 tablet 1  . lidocaine-prilocaine (EMLA) cream Apply 1 application topically See admin instructions. Apply topically one hour before dialysis - Monday, Wednesday, Friday    . loratadine (CLARITIN) 10 MG tablet 10 mg every other day. 45 tablet 1  . losartan (COZAAR) 100 MG tablet Take 1 tablet (100 mg total) by mouth daily. 90 tablet 1  . metoprolol succinate (TOPROL-XL) 100 MG 24 hr tablet TAKE 1 TABLET BY MOUTH TWICE DAILY (Patient taking differently: TAKE 1 TABLET (100 MG)  BY MOUTH TWICE DAILY) 180 tablet 1  . multivitamin (RENA-VIT) TABS tablet Take 1 tablet by mouth at bedtime. (Patient taking differently: Take 1 tablet by mouth daily. ) 60 tablet 5  . oxyCODONE-acetaminophen (PERCOCET/ROXICET) 5-325 MG tablet Take 1 tablet by mouth every 4 (four) hours as needed for severe pain. 8 tablet 0  . pantoprazole (PROTONIX) 20 MG tablet Take 1 tablet (20 mg total) by mouth 2 (two) times daily before a meal. 60 tablet 1  . Pramoxine-Benzyl Alcohol (ITCH-X) 1-10 % GEL Apply 1 application topically 3 (three) times daily as needed. 70.5 g 0  . pregabalin (LYRICA) 100 MG capsule Take  100 mg by mouth at bedtime.     . sevelamer carbonate (RENVELA) 800 MG tablet Take 2,400 mg by mouth See admin instructions. Take 3 tablets (2400 mg) by mouth with each meal or snack - once or twice daily    . cinacalcet (SENSIPAR) 60 MG tablet Take 60 mg by mouth daily.    . nicotine (NICODERM CQ - DOSED IN MG/24 HOURS) 21 mg/24hr patch Place 21 mg onto the skin daily.    Marland Kitchen nystatin (MYCOSTATIN) 100000 UNIT/ML suspension TAKE 5 MLS BY MOUTH 4 TIMES A DAY SWISH AND SWALLOW  0   No current facility-administered medications on file prior to  visit.    There are no Patient Instructions on file for this visit. No follow-ups on file.  Hadlie Gipson A Emaree Chiu, PA-C

## 2017-09-23 DIAGNOSIS — N2581 Secondary hyperparathyroidism of renal origin: Secondary | ICD-10-CM | POA: Diagnosis not present

## 2017-09-23 DIAGNOSIS — Z992 Dependence on renal dialysis: Secondary | ICD-10-CM | POA: Diagnosis not present

## 2017-09-23 DIAGNOSIS — D631 Anemia in chronic kidney disease: Secondary | ICD-10-CM | POA: Diagnosis not present

## 2017-09-23 DIAGNOSIS — N186 End stage renal disease: Secondary | ICD-10-CM | POA: Diagnosis not present

## 2017-09-25 DIAGNOSIS — D631 Anemia in chronic kidney disease: Secondary | ICD-10-CM | POA: Diagnosis not present

## 2017-09-25 DIAGNOSIS — Z992 Dependence on renal dialysis: Secondary | ICD-10-CM | POA: Diagnosis not present

## 2017-09-25 DIAGNOSIS — N2581 Secondary hyperparathyroidism of renal origin: Secondary | ICD-10-CM | POA: Diagnosis not present

## 2017-09-25 DIAGNOSIS — N186 End stage renal disease: Secondary | ICD-10-CM | POA: Diagnosis not present

## 2017-09-28 DIAGNOSIS — Z992 Dependence on renal dialysis: Secondary | ICD-10-CM | POA: Diagnosis not present

## 2017-09-28 DIAGNOSIS — N2581 Secondary hyperparathyroidism of renal origin: Secondary | ICD-10-CM | POA: Diagnosis not present

## 2017-09-28 DIAGNOSIS — N186 End stage renal disease: Secondary | ICD-10-CM | POA: Diagnosis not present

## 2017-09-28 DIAGNOSIS — D631 Anemia in chronic kidney disease: Secondary | ICD-10-CM | POA: Diagnosis not present

## 2017-09-30 DIAGNOSIS — D631 Anemia in chronic kidney disease: Secondary | ICD-10-CM | POA: Diagnosis not present

## 2017-09-30 DIAGNOSIS — E1129 Type 2 diabetes mellitus with other diabetic kidney complication: Secondary | ICD-10-CM | POA: Diagnosis not present

## 2017-09-30 DIAGNOSIS — N186 End stage renal disease: Secondary | ICD-10-CM | POA: Diagnosis not present

## 2017-09-30 DIAGNOSIS — N2581 Secondary hyperparathyroidism of renal origin: Secondary | ICD-10-CM | POA: Diagnosis not present

## 2017-09-30 DIAGNOSIS — Z992 Dependence on renal dialysis: Secondary | ICD-10-CM | POA: Diagnosis not present

## 2017-10-02 DIAGNOSIS — D631 Anemia in chronic kidney disease: Secondary | ICD-10-CM | POA: Diagnosis not present

## 2017-10-02 DIAGNOSIS — Z992 Dependence on renal dialysis: Secondary | ICD-10-CM | POA: Diagnosis not present

## 2017-10-02 DIAGNOSIS — N2581 Secondary hyperparathyroidism of renal origin: Secondary | ICD-10-CM | POA: Diagnosis not present

## 2017-10-02 DIAGNOSIS — N186 End stage renal disease: Secondary | ICD-10-CM | POA: Diagnosis not present

## 2017-10-05 DIAGNOSIS — N2581 Secondary hyperparathyroidism of renal origin: Secondary | ICD-10-CM | POA: Diagnosis not present

## 2017-10-05 DIAGNOSIS — D631 Anemia in chronic kidney disease: Secondary | ICD-10-CM | POA: Diagnosis not present

## 2017-10-05 DIAGNOSIS — Z992 Dependence on renal dialysis: Secondary | ICD-10-CM | POA: Diagnosis not present

## 2017-10-05 DIAGNOSIS — N186 End stage renal disease: Secondary | ICD-10-CM | POA: Diagnosis not present

## 2017-10-07 DIAGNOSIS — D631 Anemia in chronic kidney disease: Secondary | ICD-10-CM | POA: Diagnosis not present

## 2017-10-07 DIAGNOSIS — Z992 Dependence on renal dialysis: Secondary | ICD-10-CM | POA: Diagnosis not present

## 2017-10-07 DIAGNOSIS — N2581 Secondary hyperparathyroidism of renal origin: Secondary | ICD-10-CM | POA: Diagnosis not present

## 2017-10-07 DIAGNOSIS — N186 End stage renal disease: Secondary | ICD-10-CM | POA: Diagnosis not present

## 2017-10-09 DIAGNOSIS — Z992 Dependence on renal dialysis: Secondary | ICD-10-CM | POA: Diagnosis not present

## 2017-10-09 DIAGNOSIS — N2581 Secondary hyperparathyroidism of renal origin: Secondary | ICD-10-CM | POA: Diagnosis not present

## 2017-10-09 DIAGNOSIS — N186 End stage renal disease: Secondary | ICD-10-CM | POA: Diagnosis not present

## 2017-10-09 DIAGNOSIS — D631 Anemia in chronic kidney disease: Secondary | ICD-10-CM | POA: Diagnosis not present

## 2017-10-12 DIAGNOSIS — Z992 Dependence on renal dialysis: Secondary | ICD-10-CM | POA: Diagnosis not present

## 2017-10-12 DIAGNOSIS — D631 Anemia in chronic kidney disease: Secondary | ICD-10-CM | POA: Diagnosis not present

## 2017-10-12 DIAGNOSIS — N2581 Secondary hyperparathyroidism of renal origin: Secondary | ICD-10-CM | POA: Diagnosis not present

## 2017-10-12 DIAGNOSIS — N186 End stage renal disease: Secondary | ICD-10-CM | POA: Diagnosis not present

## 2017-10-13 ENCOUNTER — Encounter (INDEPENDENT_AMBULATORY_CARE_PROVIDER_SITE_OTHER): Payer: Self-pay

## 2017-10-13 ENCOUNTER — Other Ambulatory Visit: Payer: Self-pay | Admitting: *Deleted

## 2017-10-13 ENCOUNTER — Ambulatory Visit: Payer: Medicare Other | Admitting: Family Medicine

## 2017-10-13 ENCOUNTER — Telehealth (INDEPENDENT_AMBULATORY_CARE_PROVIDER_SITE_OTHER): Payer: Self-pay | Admitting: Vascular Surgery

## 2017-10-13 MED ORDER — METOPROLOL SUCCINATE ER 100 MG PO TB24: ORAL_TABLET | ORAL | 3 refills | Status: AC

## 2017-10-13 NOTE — Telephone Encounter (Signed)
See note below, does the patient need and U/s or procedure.

## 2017-10-14 DIAGNOSIS — Z992 Dependence on renal dialysis: Secondary | ICD-10-CM | POA: Diagnosis not present

## 2017-10-14 DIAGNOSIS — N2581 Secondary hyperparathyroidism of renal origin: Secondary | ICD-10-CM | POA: Diagnosis not present

## 2017-10-14 DIAGNOSIS — D631 Anemia in chronic kidney disease: Secondary | ICD-10-CM | POA: Diagnosis not present

## 2017-10-14 DIAGNOSIS — N186 End stage renal disease: Secondary | ICD-10-CM | POA: Diagnosis not present

## 2017-10-14 NOTE — Telephone Encounter (Signed)
Patient is scheduled for a shuntogram with Dew on 10/22/17.

## 2017-10-16 DIAGNOSIS — Z992 Dependence on renal dialysis: Secondary | ICD-10-CM | POA: Diagnosis not present

## 2017-10-16 DIAGNOSIS — N186 End stage renal disease: Secondary | ICD-10-CM | POA: Diagnosis not present

## 2017-10-16 DIAGNOSIS — N2581 Secondary hyperparathyroidism of renal origin: Secondary | ICD-10-CM | POA: Diagnosis not present

## 2017-10-16 DIAGNOSIS — D631 Anemia in chronic kidney disease: Secondary | ICD-10-CM | POA: Diagnosis not present

## 2017-10-19 ENCOUNTER — Emergency Department (HOSPITAL_COMMUNITY)
Admission: EM | Admit: 2017-10-19 | Discharge: 2017-10-19 | Disposition: A | Discharge status: Home / Self Care | Payer: Medicare Other

## 2017-10-19 NOTE — ED Notes (Signed)
Patient did not answer when called for triage.  Will d/c.

## 2017-10-19 NOTE — ED Notes (Signed)
Called pt for triage no answer 

## 2017-10-20 ENCOUNTER — Ambulatory Visit: Payer: Medicare Other | Admitting: Adult Health

## 2017-10-20 ENCOUNTER — Other Ambulatory Visit (INDEPENDENT_AMBULATORY_CARE_PROVIDER_SITE_OTHER): Payer: Self-pay | Admitting: Vascular Surgery

## 2017-10-20 DIAGNOSIS — N186 End stage renal disease: Secondary | ICD-10-CM | POA: Diagnosis not present

## 2017-10-20 DIAGNOSIS — I871 Compression of vein: Secondary | ICD-10-CM | POA: Diagnosis not present

## 2017-10-20 DIAGNOSIS — Z992 Dependence on renal dialysis: Secondary | ICD-10-CM | POA: Diagnosis not present

## 2017-10-20 DIAGNOSIS — T82868A Thrombosis of vascular prosthetic devices, implants and grafts, initial encounter: Secondary | ICD-10-CM | POA: Diagnosis not present

## 2017-10-20 NOTE — Progress Notes (Deleted)
PATIENT: [name] DOB: 13-Feb-1941  REASON FOR VISIT: follow up HISTORY FROM: patient  HISTORY OF PRESENT ILLNESS: Today 10/20/17      HISTORY 05/19/17 MM: [name] is a 77 year old female with a history of chronic back pain and peripheral neuropathy.  She returns today for an evaluation.  At the last visit with Dr. Jannifer Franklin she reported weakness in the lower extremities and trouble ambulating.  A MRI of the lumbar spine was completed that did not show any changes that could be the result of her symptoms.  The patient was scheduled for nerve conduction studies but she canceled her appointment because they "hurt so bad" the first time.  She states that since her visit her ambulation has not gotten any better.  She states that she typically has spells throughout the morning that she describes as generalized weakness, feeling as if she is going to collapse.  She states that the weakness is not only in her legs but in her arms and even her head.  She states that she does not feel dizzy but rather her head feels heavy.  She states that it can last approximately 15-20 minutes and then she may feel better for 30 minutes and then she will have another spell.  She states that all of the symptoms started after her hospitalization in July from pneumonia.  She was only in the hospital 1-1/2 days.  She states that she is also been having word finding difficulty.  States that she can be in mid conversation she did discuss this with her nephrologist the patient reports that she never scheduled this although appears the appointment was canceled.  She also followed up with her primary care who also noted that the MRI was canceled patient returns today for an evaluation.  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 The patient  has undergone an epidural steroid injection that occurred around 11/19/2016. The patient went into the hospital around 12/06/2016 with a pneumonia. Since that time, she has not been able to walk well with some reports of weakness of the legs, she feels that the left leg is weaker than the right. At nighttime, she may have jerking and twitching of the legs that keep her awake. She is on Lyrica taking 100 mg at night. She takes Cymbalta which was recently increased from 60 to 120 mg daily. The patient denies any pain in the back or down the legs, she did have some discomfort with the epidural injection during the procedure. The patient has not noted any change in the way the bowels or the bladder are working. She does not believe there has been any progress in her ability to walk since onset, she now is getting physical therapy. The patient went to the hospital on 12/22/2016 because of the leg weakness, she spent several days in the hospital, it was felt that she was deconditioned from the pneumonia. She comes to the office today for further evaluation. She denies any dizziness when she stands, she feels as if the legs will collapse, however. Her blood pressure medication, clonidine was recently increased to 0.3 mg 3 times daily.   REVIEW OF SYSTEMS: Out of a complete 14 system review of symptoms, the patient complains only of the following symptoms, and all other reviewed systems are negative.  Appetite change,  unexpected weight change, light sensitivity, runny nose, nausea, incontinence of bowels, restless leg, insomnia, frequent waking, daytime sleepiness, muscle cramps, walking difficulty, neck stiffness, weakness, headache  ALLERGIES: Allergies  Allergen Reactions  . Tuberculin Tests Hives    "blisters"  . Valacyclovir Other (See Comments)    Confusion and nervousness  . Codeine Nausea And Vomiting  . Penicillins Rash    No problems breathing. Has tolerated omnicef in past without issue Has  patient had a PCN reaction causing immediate rash, facial/tongue/throat swelling, SOB or lightheadedness with hypotension: Yes Has patient had a PCN reaction causing severe rash involving mucus membranes or skin necrosis: No Has patient had a PCN reaction that required hospitalization No Has patient had a PCN reaction occurring within the last 10 years: No If all of the above answers are "NO", then may proceed with Cephalosporin  . Sulfamethoxazole Rash    HOME MEDICATIONS: Outpatient Medications Prior to Visit  Medication Sig Dispense Refill  . albuterol (PROVENTIL HFA;VENTOLIN HFA) 108 (90 Base) MCG/ACT inhaler Inhale 2 puffs into the lungs every 4 (four) hours as needed for wheezing or shortness of breath. 1 Inhaler 1  . allopurinol (ZYLOPRIM) 100 MG tablet Take 100 mg by mouth once daily on non-dialysis days (Tues/Thurs/Sat/Sun) (Patient taking differently: Take 100 mg by mouth See admin instructions. Take one tablet (100 mg) by mouth once daily on non-dialysis days (Tues/Thurs/Sat/Sun)) 30 tablet 0  . amLODipine (NORVASC) 10 MG tablet TAKE 1 TABLET BY MOUTH AT BEDTIME (Patient taking differently: TAKE 1 TABLET (10 MG) BY MOUTH AT BEDTIME) 90 tablet 1  . aspirin EC 81 MG tablet Take 81 mg by mouth daily.     Marland Kitchen atorvastatin (LIPITOR) 40 MG tablet Take 1 tablet (40 mg total) by mouth daily. (Patient taking differently: Take 40 mg by mouth at bedtime. ) 90 tablet 3  . azelastine (ASTELIN) 0.1 % nasal spray Place 1 spray into both nostrils 2 (two) times daily. Use in each nostril as directed 30 mL 6  . budesonide-formoterol (SYMBICORT) 160-4.5 MCG/ACT inhaler Inhale 2 puffs into the lungs 2 (two) times daily. 1 Inhaler 12  . cinacalcet (SENSIPAR) 60 MG tablet Take 60 mg by mouth daily.    . cinacalcet (SENSIPAR) 90 MG tablet Take 90 mg by mouth at bedtime.    . dicyclomine (BENTYL) 10 MG capsule Take 1 capsule (10 mg total) by mouth 4 (four) times daily -  before meals and at bedtime. 120 capsule  1  . DULoxetine (CYMBALTA) 30 MG capsule Take 2 capsules (60 mg total) by mouth daily. (Patient taking differently: Take 30 mg by mouth at bedtime. ) 180 capsule 3  . fluticasone (FLONASE) 50 MCG/ACT nasal spray Place 2 sprays into both nostrils daily as needed for allergies or rhinitis. 16 g 3  . glycopyrrolate (ROBINUL) 2 MG tablet Take 1 tablet (2 mg total) by mouth 2 (two) times daily. 60 tablet 11  . hydrOXYzine (ATARAX/VISTARIL) 25 MG tablet Take 1 tablet (25 mg total) by mouth every 8 (eight) hours as needed for itching. 30 tablet 1  . lidocaine-prilocaine (EMLA) cream Apply 1 application topically See admin instructions. Apply topically one hour before dialysis - Monday, Wednesday, Friday    . loratadine (CLARITIN) 10 MG tablet 10 mg every other day. 45 tablet 1  . losartan (COZAAR) 100 MG tablet Take 1 tablet (100 mg total) by mouth daily. 90 tablet 1  . metoprolol succinate (TOPROL-XL) 100 MG 24 hr tablet TAKE 1 TABLET (100 MG)  BY MOUTH TWICE DAILY 180 tablet 3  . multivitamin (RENA-VIT) TABS tablet Take 1 tablet by mouth at bedtime. (Patient taking differently: Take 1 tablet by mouth daily. ) 60 tablet 5  . nicotine (NICODERM CQ - DOSED IN MG/24 HOURS) 21 mg/24hr patch Place 21 mg onto the skin daily.    Marland Kitchen nystatin (MYCOSTATIN) 100000 UNIT/ML suspension TAKE 5 MLS BY MOUTH 4 TIMES A DAY SWISH AND SWALLOW  0  . oxyCODONE-acetaminophen (PERCOCET/ROXICET) 5-325 MG tablet Take 1 tablet by mouth every 4 (four) hours as needed for severe pain. 8 tablet 0  . pantoprazole (PROTONIX) 20 MG tablet Take 1 tablet (20 mg total) by mouth 2 (two) times daily before a meal. 60 tablet 1  . Pramoxine-Benzyl Alcohol (ITCH-X) 1-10 % GEL Apply 1 application topically 3 (three) times daily as needed. 70.5 g 0  . pregabalin (LYRICA) 100 MG capsule Take 100 mg by mouth at bedtime.     . sevelamer carbonate (RENVELA) 800 MG tablet Take 2,400 mg by mouth See admin instructions. Take 3 tablets (2400 mg) by mouth  with each meal or snack - once or twice daily     No facility-administered medications prior to visit.     PAST MEDICAL HISTORY: Past Medical History:  Diagnosis Date  . Abnormality of gait 03/28/2015  . Adrenal mass ([place])   . 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 Arthritis   . Back pain   . CHF (congestive heart failure) ([place])   . Chronic kidney disease    Hemo MWF  . Congestion of throat    Pt states she has a lot mucus in back throat.  . Constipation   . COPD (chronic obstructive pulmonary disease) ([place])   . Depression   . Dialysis patient [facility])    kidney  . Diverticulitis   . GERD (gastroesophageal reflux disease)   . H/O hiatal hernia   . High cholesterol   . Hoarseness of voice   . Hyperlipidemia   . Hypertension   . IBS (irritable bowel syndrome)   . Meralgia paresthetica of right side 12/26/2014  . Normal cardiac stress test 12/24/2009   lexiscan, imaging normal  . Renal disorder   . RLS (restless legs syndrome)   . Seizures ([place])    2004 past brain surgery  . Sinus complaint   . Thyroid disease   . TIA (transient ischemic attack)   . Tubular adenoma of colon 01/2008    PAST SURGICAL HISTORY: Past Surgical History:  Procedure Laterality Date  . A/V SHUNT INTERVENTION N/A 08/13/2017   Procedure: A/V SHUNT INTERVENTION;  Surgeon: Algernon Huxley, MD;  Location: [facility];  Service: Cardiovascular;  Laterality: N/A;  . A/V SHUNTOGRAM Left 08/13/2017   Procedure: A/V SHUNTOGRAM;  Surgeon: Algernon Huxley, MD;  Location: [facility];  Service: Cardiovascular;  Laterality: Left;  . ABDOMINAL HYSTERECTOMY    . ANAL RECTAL MANOMETRY N/A 07/25/2015   Procedure: ANO RECTAL MANOMETRY;  Surgeon: Mauri Pole, MD;  Location: [facility];  Service: Endoscopy;  Laterality: N/A;  . APPENDECTOMY    . AV FISTULA PLACEMENT Left 11/11/2012   Procedure: INSERTION OF ARTERIOVENOUS (AV) GORE-TEX GRAFT ARM;  Surgeon:  Angelia Mould, MD;  Location: [facility];  Service: Vascular;  Laterality: Left;  . [place] REMOVAL Left 11/18/2012   Procedure: REMOVAL OF LEFT UPPER ARM ARTERIOVENOUS GORETEX GRAFT ([place]);  Surgeon: Angelia Mould,  MD;  Location: [facility];  Service: Vascular;  Laterality: Left;  . CARDIAC CATHETERIZATION  2003   normal  . CHOLECYSTECTOMY     Open mid line incision  . frontal craniotomy  2002   indication = sinusitis  . INSERTION OF DIALYSIS CATHETER     Left  . PATCH ANGIOPLASTY Left 11/18/2012   Procedure: PATCH ANGIOPLASTY;  Surgeon: Angelia Mould, MD;  Location: [facility];  Service: Vascular;  Laterality: Left;  . PERIPHERAL VASCULAR CATHETERIZATION Left 03/08/2015   Procedure: A/V Shuntogram/Fistulagram;  Surgeon: Algernon Huxley, MD;  Location: [facility];  Service: Cardiovascular;  Laterality: Left;  . PERIPHERAL VASCULAR CATHETERIZATION Left 03/08/2015   Procedure: A/V Shunt Intervention;  Surgeon: Algernon Huxley, MD;  Location: [facility];  Service: Cardiovascular;  Laterality: Left;  . RECTAL ULTRASOUND N/A 10/05/2015   Procedure: ANAL ULTRASOUND WITH PROBE;  Surgeon: Leighton Ruff, MD;  [facility];  Service: Endoscopy;  Laterality: N/A;  . REVISON OF ARTERIOVENOUS FISTULA  05/11/2012   Procedure: REVISON OF ARTERIOVENOUS FISTULA;  Surgeon: Elam Dutch, MD;  Location: [facility];  Service: Vascular;  Laterality: Right;  . TUBAL LIGATION      FAMILY HISTORY: Family History  Problem Relation Age of Onset  . Thyroid cancer Mother   . Heart disease Father   . Hypertension Father   . Heart attack Father   . Lupus Daughter   . Breast cancer Sister   . Thyroid cancer Sister   . Kidney disease Sister   . Colon cancer Neg Hx     SOCIAL HISTORY: Social History   Socioeconomic History  . Marital status: Legally Separated    Spouse name: Not on file  . Number of children: 3  . Years of education: 31  . Highest education level: Not on file    Occupational History  . Occupation: retired  Scientific laboratory technician  . Financial resource strain: Not on file  . Food insecurity:    Worry: Not on file    Inability: Not on file  . Transportation needs:    Medical: Not on file    Non-medical: Not on file  Tobacco Use  . Smoking status: Current Every Day Smoker    Packs/day: 1.00    Years: 60.00    Pack years: 60.00    Types: Cigarettes  . Smokeless tobacco: Never Used  . Tobacco comment: smoker since 77 yo.  1.5 ppd of [place] 100 lights. ; form given 10-23-16  Substance and Sexual Activity  . Alcohol use: No    Alcohol/week: 0.0 oz  . Drug use: No    Comment: No hx of IV drug use  . Sexual activity: Never    Birth control/protection: None  Lifestyle  . Physical activity:    Days per week: Not on file    Minutes per session: Not on file  . Stress: Not on file  Relationships  . Social connections:    Talks on phone: Not on file    Gets together: Not on file    Attends religious service: Not on file    Active member of club or organization: Not on file    Attends meetings of clubs or organizations: Not on file    Relationship status: Not on file  . Intimate partner violence:    Fear of current or ex partner: Not on file    Emotionally abused: Not on file    Physically abused: Not on file  Forced sexual activity: Not on file  Other Topics Concern  . Not on file  Social History Narrative   ** Merged History Encounter **       Lives in [place] with Daughter, granddaughters (twins) and mentally challenged niece.   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   Patient has a college education.   Patient drinks one cup of caffeine daily.               PHYSICAL EXAM  There were no vitals filed for this visit. There is no height or weight on file to calculate BMI.  Generalized: Well developed, in no acute distress   Neurological examination  Mentation: Alert oriented to time, place, history taking. Follows  all commands speech and language fluent Cranial nerve II-XII: Pupils were equal round reactive to light. Extraocular movements were full, visual field were full on confrontational test. Facial sensation and strength were normal. Uvula tongue midline. Head turning and shoulder shrug  were normal and symmetric. Motor: The motor testing reveals 5 over 5 strength of all 4 extremities. Good symmetric motor tone is noted throughout.  Sensory: Sensory testing is intact to soft touch on all 4 extremities. No evidence of extinction is noted.  Coordination: Cerebellar testing reveals good finger-nose-finger and heel-to-shin bilaterally.  Gait and station: Patient uses a cane when ambulating.  Tandem gait not attempted. Reflexes: Deep tendon reflexes are symmetric and normal bilaterally.   DIAGNOSTIC DATA (LABS, IMAGING, TESTING) - I reviewed patient records, labs, notes, testing and imaging myself where available.  Lab Results  Component Value Date   WBC 12.4 (H) 08/20/2017   HGB 14.0 08/20/2017   HCT 43.2 08/20/2017   MCV 94.1 08/20/2017   PLT 200 08/20/2017      Component Value Date/Time   NA 135 08/20/2017 0928   NA 139 01/08/2017 1324   NA 136 07/07/2013 1334   K 4.7 08/20/2017 0928   K 4.0 07/07/2013 1334   CL 93 (L) 08/20/2017 0928   CL 100 07/07/2013 1334   CO2 26 08/20/2017 0928   CO2 29 07/07/2013 1334   GLUCOSE 93 08/20/2017 0928   GLUCOSE 75 07/07/2013 1334   BUN 16 08/20/2017 0928   BUN 13 01/08/2017 1324   BUN 30 (H) 07/07/2013 1334   CREATININE 7.13 (H) 08/20/2017 0928   CREATININE 7.07 (H) 07/07/2013 1334   CREATININE 4.92 (H) 03/11/2012 1218   CALCIUM 8.5 (L) 08/20/2017 0928   CALCIUM 10.0 07/07/2013 1334   CALCIUM 8.4 05/06/2010 0515   PROT 6.6 09/16/2017 1458   PROT 6.8 01/08/2017 1324   ALBUMIN 3.2 (L) 09/16/2017 1458   ALBUMIN 3.7 01/08/2017 1324   AST 17 09/16/2017 1458   ALT 10 09/16/2017 1458   ALKPHOS 70 09/16/2017 1458   BILITOT 0.4 09/16/2017 1458    BILITOT 0.2 01/08/2017 1324   GFRNONAA 5 (L) 08/20/2017 0928   GFRNONAA 5 (L) 07/07/2013 1334   GFRAA 6 (L) 08/20/2017 0928   GFRAA 6 (L) 07/07/2013 1334   Lab Results  Component Value Date   CHOL 84 07/24/2017   HDL 46 07/24/2017   LDLCALC 24 07/24/2017   LDLDIRECT 76 08/13/2011   TRIG 69 07/24/2017   CHOLHDL 1.8 07/24/2017   Lab Results  Component Value Date   HGBA1C 5.5 07/24/2017   Lab Results  Component Value Date   VITAMINB12 1,524 (H) 01/08/2017   Lab Results  Component Value Date   TSH 1.464 12/07/2016  ASSESSMENT AND PLAN 77 y.o. year old female  has a past medical history of Abnormality of gait (03/28/2015), Adrenal mass ([place]), ANEMIA NEC (03/31/2007), Arthritis, Back pain, CHF (congestive heart failure) ([facility]), Chronic kidney disease, Congestion of throat, Constipation, COPD (chronic obstructive pulmonary disease) ([place]), Depression, Dialysis patient ([place]), Diverticulitis, GERD (gastroesophageal reflux disease), H/O hiatal hernia, High cholesterol, Hoarseness of voice, Hyperlipidemia, Hypertension, IBS (irritable bowel syndrome), Meralgia paresthetica of right side (12/26/2014), Normal cardiac stress test (12/24/2009), Renal disorder, RLS (restless legs syndrome), Seizures ([place]), Sinus complaint, Thyroid disease, TIA (transient ischemic attack), and Tubular adenoma of colon (01/2008). here with:  1.  Word finding issues 2.  Generalized weakness 3.  Peripheral neuropathy  The patient's exam was relatively unremarkable.  Due to her description of generalized weakness as well as word finding issues I recommended an MRI of the brain without contrast.  Patient is amenable to this plan.  It appears that her primary care has referred her for home health services.  Advised patient that if her symptoms worsen or she develops new symptoms she should let us know.  She will follow-up in 6 months or sooner if needed.  Venancio Poisson, AGNP-BC  [facility] [address] [place], [place] [address]  Phone [phone] Fax [phone]

## 2017-10-21 DIAGNOSIS — N2581 Secondary hyperparathyroidism of renal origin: Secondary | ICD-10-CM | POA: Diagnosis not present

## 2017-10-21 DIAGNOSIS — N186 End stage renal disease: Secondary | ICD-10-CM | POA: Diagnosis not present

## 2017-10-21 DIAGNOSIS — D631 Anemia in chronic kidney disease: Secondary | ICD-10-CM | POA: Diagnosis not present

## 2017-10-21 DIAGNOSIS — Z992 Dependence on renal dialysis: Secondary | ICD-10-CM | POA: Diagnosis not present

## 2017-10-21 MED ORDER — CLINDAMYCIN PHOSPHATE 300 MG/50ML IV SOLN: 300.0000 mg | Freq: Once | INTRAVENOUS | Status: DC

## 2017-10-22 ENCOUNTER — Encounter: Admission: RE | Payer: Self-pay | Source: Ambulatory Visit

## 2017-10-22 SURGERY — A/V SHUNTOGRAM
Anesthesia: Moderate Sedation

## 2017-10-23 ENCOUNTER — Ambulatory Visit: Admission: RE | Admit: 2017-10-23 | Payer: Medicare Other | Source: Ambulatory Visit | Admitting: Vascular Surgery

## 2017-10-23 DIAGNOSIS — N186 End stage renal disease: Secondary | ICD-10-CM | POA: Diagnosis not present

## 2017-10-23 DIAGNOSIS — Z992 Dependence on renal dialysis: Secondary | ICD-10-CM | POA: Diagnosis not present

## 2017-10-23 DIAGNOSIS — N2581 Secondary hyperparathyroidism of renal origin: Secondary | ICD-10-CM | POA: Diagnosis not present

## 2017-10-23 DIAGNOSIS — D631 Anemia in chronic kidney disease: Secondary | ICD-10-CM | POA: Diagnosis not present

## 2017-10-26 DIAGNOSIS — Z992 Dependence on renal dialysis: Secondary | ICD-10-CM | POA: Diagnosis not present

## 2017-10-26 DIAGNOSIS — D631 Anemia in chronic kidney disease: Secondary | ICD-10-CM | POA: Diagnosis not present

## 2017-10-26 DIAGNOSIS — N2581 Secondary hyperparathyroidism of renal origin: Secondary | ICD-10-CM | POA: Diagnosis not present

## 2017-10-26 DIAGNOSIS — N186 End stage renal disease: Secondary | ICD-10-CM | POA: Diagnosis not present

## 2017-10-28 DIAGNOSIS — N2581 Secondary hyperparathyroidism of renal origin: Secondary | ICD-10-CM | POA: Diagnosis not present

## 2017-10-28 DIAGNOSIS — N186 End stage renal disease: Secondary | ICD-10-CM | POA: Diagnosis not present

## 2017-10-28 DIAGNOSIS — Z992 Dependence on renal dialysis: Secondary | ICD-10-CM | POA: Diagnosis not present

## 2017-10-28 DIAGNOSIS — D631 Anemia in chronic kidney disease: Secondary | ICD-10-CM | POA: Diagnosis not present

## 2017-10-29 ENCOUNTER — Other Ambulatory Visit: Payer: Self-pay | Admitting: *Deleted

## 2017-10-29 DIAGNOSIS — J309 Allergic rhinitis, unspecified: Secondary | ICD-10-CM

## 2017-10-29 DIAGNOSIS — R0981 Nasal congestion: Secondary | ICD-10-CM

## 2017-10-29 MED ORDER — LOSARTAN POTASSIUM 100 MG PO TABS: 100.0000 mg | ORAL_TABLET | Freq: Every day | ORAL | 1 refills | Status: AC

## 2017-10-29 MED ORDER — FLUTICASONE PROPIONATE 50 MCG/ACT NA SUSP: 2.0000 | Freq: Every day | NASAL | 3 refills | Status: AC | PRN

## 2017-10-29 MED ORDER — LORATADINE 10 MG PO TABS: ORAL_TABLET | ORAL | 1 refills | Status: AC

## 2017-10-30 DIAGNOSIS — D631 Anemia in chronic kidney disease: Secondary | ICD-10-CM | POA: Diagnosis not present

## 2017-10-30 DIAGNOSIS — Z992 Dependence on renal dialysis: Secondary | ICD-10-CM | POA: Diagnosis not present

## 2017-10-30 DIAGNOSIS — N186 End stage renal disease: Secondary | ICD-10-CM | POA: Diagnosis not present

## 2017-10-30 DIAGNOSIS — N2581 Secondary hyperparathyroidism of renal origin: Secondary | ICD-10-CM | POA: Diagnosis not present

## 2017-10-31 DIAGNOSIS — N186 End stage renal disease: Secondary | ICD-10-CM | POA: Diagnosis not present

## 2017-10-31 DIAGNOSIS — E1129 Type 2 diabetes mellitus with other diabetic kidney complication: Secondary | ICD-10-CM | POA: Diagnosis not present

## 2017-10-31 DIAGNOSIS — Z992 Dependence on renal dialysis: Secondary | ICD-10-CM | POA: Diagnosis not present

## 2017-11-02 DIAGNOSIS — Z992 Dependence on renal dialysis: Secondary | ICD-10-CM | POA: Diagnosis not present

## 2017-11-02 DIAGNOSIS — D631 Anemia in chronic kidney disease: Secondary | ICD-10-CM | POA: Diagnosis not present

## 2017-11-02 DIAGNOSIS — N2581 Secondary hyperparathyroidism of renal origin: Secondary | ICD-10-CM | POA: Diagnosis not present

## 2017-11-02 DIAGNOSIS — N186 End stage renal disease: Secondary | ICD-10-CM | POA: Diagnosis not present

## 2017-11-04 DIAGNOSIS — Z992 Dependence on renal dialysis: Secondary | ICD-10-CM | POA: Diagnosis not present

## 2017-11-04 DIAGNOSIS — N186 End stage renal disease: Secondary | ICD-10-CM | POA: Diagnosis not present

## 2017-11-04 DIAGNOSIS — N2581 Secondary hyperparathyroidism of renal origin: Secondary | ICD-10-CM | POA: Diagnosis not present

## 2017-11-04 DIAGNOSIS — D631 Anemia in chronic kidney disease: Secondary | ICD-10-CM | POA: Diagnosis not present

## 2017-11-05 ENCOUNTER — Telehealth: Payer: Self-pay | Admitting: Adult Health

## 2017-11-05 ENCOUNTER — Encounter: Payer: Self-pay | Admitting: Adult Health

## 2017-11-05 ENCOUNTER — Ambulatory Visit (INDEPENDENT_AMBULATORY_CARE_PROVIDER_SITE_OTHER): Payer: Medicare Other | Admitting: Adult Health

## 2017-11-05 VITALS — BP 130/43 | HR 60 | Ht 61.0 in | Wt 156.0 lb

## 2017-11-05 DIAGNOSIS — G609 Hereditary and idiopathic neuropathy, unspecified: Secondary | ICD-10-CM

## 2017-11-05 DIAGNOSIS — M6281 Muscle weakness (generalized): Secondary | ICD-10-CM

## 2017-11-05 NOTE — Patient Instructions (Addendum)
Your Plan:  Continue cymbalta 60mg   Daily and lyrica 100mg  at bedside  Placed order for home health physical therapy and occupational therapy    Please follow up in 6 months or call earlier if needed        Thank you for coming to see [place] at [facility]. I hope we have been able to provide you high quality care today.  You may receive a patient satisfaction survey over the next few weeks. We would appreciate your feedback and comments so that we may continue to improve ourselves and the health of our patients.

## 2017-11-05 NOTE — Progress Notes (Signed)
PATIENT: [name] DOB: 01-17-1941  REASON FOR VISIT: follow up HISTORY FROM: patient  HISTORY OF PRESENT ILLNESS: Today 11/05/17  Patient returns today for 92-month follow-up.  She continues to take Lyrica and Cymbalta for neuropathy pain.  Continues to have neuropathy pain especially increase at bedtime but currently is on full dose due to end-stage renal disease on dialysis.  She states that recently she has had decreased appetite leading to weight loss along with increased generalized muscle weakness.  She currently has a home health nurse to assist her 6 days a week.  Patient is requesting motorized scooter at this time as she has a difficult time with ambulation.  Currently uses a cane.  She states she has a 64 year old niece with Down syndrome that she has been caring for for over 26 years and in December she will have to send her to a different home due to difficulty in caring for her.  Patient returns today for evaluation.   HISTORY 05/19/17: [name] is a 77 year old female with a history of chronic back pain and peripheral neuropathy.  She returns today for an evaluation.  At the last visit with Dr. Jannifer Franklin she reported weakness in the lower extremities and trouble ambulating.  A MRI of the lumbar spine was completed that did not show any changes that could be the result of her symptoms.  The patient was scheduled for nerve conduction studies but she canceled her appointment because they "hurt so bad" the first time.  She states that since her visit her ambulation has not gotten any better.  She states that she typically has spells throughout the morning that she describes as generalized weakness, feeling as if she is going to collapse.  She states that the weakness is not only in her legs but in her arms and even her head.  She states that she does not feel dizzy but rather her head feels heavy.  She states that it can last approximately 15-20 minutes and then she may feel better for  30 minutes and then she will have another spell.  She states that all of the symptoms started after her hospitalization in July from pneumonia.  She was only in the hospital 1-1/2 days.  She states that she is also been having word finding difficulty.  States that she can be in mid conversation she did discuss this with her nephrologist the patient reports that she never scheduled this although appears the appointment was canceled.  She also followed up with her primary care who also noted that the MRI was canceled patient returns today for an evaluation.  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 The patient has undergone an epidural steroid injection that occurred around 11/19/2016. The patient went into the hospital around 12/06/2016 with a pneumonia. Since that time, she has not been able to walk well with some reports of weakness of the legs, she feels that the left leg is weaker than the right. At nighttime, she may have jerking and twitching of the legs that keep her awake. She is on Lyrica taking 100 mg at night. She takes Cymbalta which was recently increased from 60 to 120 mg daily. The patient denies any pain in the back or down the legs, she did have some discomfort with the epidural injection during the  procedure. The patient has not noted any change in the way the bowels or the bladder are working. She does not believe there has been any progress in her ability to walk since onset, she now is getting physical therapy. The patient went to the hospital on 12/22/2016 because of the leg weakness, she spent several days in the hospital, it was felt that she was deconditioned from the pneumonia. She comes to the office today for further evaluation. She denies any dizziness when she stands, she feels as if the legs will  collapse, however. Her blood pressure medication, clonidine was recently increased to 0.3 mg 3 times daily.   REVIEW OF SYSTEMS: Out of a complete 14 system review of symptoms, the patient complains only of the following symptoms, and all other reviewed systems are negative.  Appetite change, unexpected weight change, light sensitivity, runny nose, nausea, incontinence of bowels, restless leg, insomnia, frequent waking, daytime sleepiness, muscle cramps, walking difficulty, neck stiffness, weakness, headache  ALLERGIES: Allergies  Allergen Reactions  . Tuberculin Tests Hives    "blisters"  . Valacyclovir Other (See Comments)    Confusion and nervousness  . Codeine Nausea And Vomiting  . Penicillins Rash    No problems breathing. Has tolerated omnicef in past without issue Has patient had a PCN reaction causing immediate rash, facial/tongue/throat swelling, SOB or lightheadedness with hypotension: Yes Has patient had a PCN reaction causing severe rash involving mucus membranes or skin necrosis: No Has patient had a PCN reaction that required hospitalization No Has patient had a PCN reaction occurring within the last 10 years: No If all of the above answers are "NO", then may proceed with Cephalosporin  . Sulfamethoxazole Rash    HOME MEDICATIONS: Outpatient Medications Prior to Visit  Medication Sig Dispense Refill  . albuterol (PROVENTIL HFA;VENTOLIN HFA) 108 (90 Base) MCG/ACT inhaler Inhale 2 puffs into the lungs every 4 (four) hours as needed for wheezing or shortness of breath. 1 Inhaler 1  . allopurinol (ZYLOPRIM) 100 MG tablet Take 100 mg by mouth once daily on non-dialysis days (Tues/Thurs/Sat/Sun) (Patient taking differently: Take 100 mg by mouth See admin instructions. Take one tablet (100 mg) by mouth once daily on non-dialysis days (Tues/Thurs/Sat/Sun)) 30 tablet 0  . amLODipine (NORVASC) 10 MG tablet TAKE 1 TABLET BY MOUTH AT BEDTIME (Patient taking differently: TAKE 1  TABLET (10 MG) BY MOUTH AT BEDTIME) 90 tablet 1  . aspirin EC 81 MG tablet Take 81 mg by mouth daily.     Marland Kitchen atorvastatin (LIPITOR) 40 MG tablet Take 1 tablet (40 mg total) by mouth daily. (Patient taking differently: Take 40 mg by mouth at bedtime. ) 90 tablet 3  . azelastine (ASTELIN) 0.1 % nasal spray Place 1 spray into both nostrils 2 (two) times daily. Use in each nostril as directed 30 mL 6  . budesonide-formoterol (SYMBICORT) 160-4.5 MCG/ACT inhaler Inhale 2 puffs into the lungs 2 (two) times daily. 1 Inhaler 12  . cinacalcet (SENSIPAR) 60 MG tablet Take 60 mg by mouth daily.    . cinacalcet (SENSIPAR) 90 MG tablet Take 90 mg by mouth at bedtime.    . dicyclomine (BENTYL) 10 MG capsule Take 1 capsule (10 mg total) by mouth 4 (four) times daily -  before meals and at bedtime. 120 capsule 1  . DULoxetine (CYMBALTA) 30 MG capsule Take 2 capsules (60 mg total) by mouth daily. (Patient taking differently: Take 30 mg by mouth at bedtime. ) 180 capsule 3  . fluticasone (FLONASE)  50 MCG/ACT nasal spray Place 2 sprays into both nostrils daily as needed for allergies or rhinitis. 16 g 3  . glycopyrrolate (ROBINUL) 2 MG tablet Take 1 tablet (2 mg total) by mouth 2 (two) times daily. 60 tablet 11  . hydrOXYzine (ATARAX/VISTARIL) 25 MG tablet Take 1 tablet (25 mg total) by mouth every 8 (eight) hours as needed for itching. 30 tablet 1  . lidocaine-prilocaine (EMLA) cream Apply 1 application topically See admin instructions. Apply topically one hour before dialysis - Monday, Wednesday, Friday    . loratadine (CLARITIN) 10 MG tablet 10 mg every other day. 45 tablet 1  . losartan (COZAAR) 100 MG tablet Take 1 tablet (100 mg total) by mouth daily. 90 tablet 1  . metoprolol succinate (TOPROL-XL) 100 MG 24 hr tablet TAKE 1 TABLET (100 MG)  BY MOUTH TWICE DAILY 180 tablet 3  . multivitamin (RENA-VIT) TABS tablet Take 1 tablet by mouth at bedtime. (Patient taking differently: Take 1 tablet by mouth daily. ) 60  tablet 5  . nystatin (MYCOSTATIN) 100000 UNIT/ML suspension TAKE 5 MLS BY MOUTH 4 TIMES A DAY SWISH AND SWALLOW  0  . pantoprazole (PROTONIX) 20 MG tablet Take 1 tablet (20 mg total) by mouth 2 (two) times daily before a meal. 60 tablet 1  . Pramoxine-Benzyl Alcohol (ITCH-X) 1-10 % GEL Apply 1 application topically 3 (three) times daily as needed. 70.5 g 0  . pregabalin (LYRICA) 100 MG capsule Take 100 mg by mouth at bedtime.     . sevelamer carbonate (RENVELA) 800 MG tablet Take 2,400 mg by mouth See admin instructions. Take 3 tablets (2400 mg) by mouth with each meal or snack - once or twice daily    . B Complex-C-Zn-Folic Acid (DIALYVITE/ZINC) TABS Take 1 tablet by mouth daily.  6  . cloNIDine (CATAPRES) 0.2 MG tablet   5  . nicotine (NICODERM CQ - DOSED IN MG/24 HOURS) 21 mg/24hr patch Place 21 mg onto the skin daily.    Marland Kitchen oxyCODONE-acetaminophen (PERCOCET/ROXICET) 5-325 MG tablet Take 1 tablet by mouth every 4 (four) hours as needed for severe pain. (Patient not taking: Reported on 11/05/2017) 8 tablet 0   No facility-administered medications prior to visit.     PAST MEDICAL HISTORY: Past Medical History:  Diagnosis Date  . Abnormality of gait 03/28/2015  . Adrenal mass ([place])   . 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 Arthritis   . Back pain   . CHF (congestive heart failure) ([place])   . Chronic kidney disease    Hemo MWF  . Congestion of throat    Pt states she has a lot mucus in back throat.  . Constipation   . COPD (chronic obstructive pulmonary disease) ([place])   . Depression   . Dialysis patient [facility])    kidney  . Diverticulitis   . GERD (gastroesophageal reflux disease)   . H/O hiatal hernia   . High cholesterol   . Hoarseness of voice   . Hyperlipidemia   . Hypertension   . IBS (irritable bowel syndrome)   . Meralgia paresthetica of right side 12/26/2014  . Normal cardiac stress test 12/24/2009   lexiscan, imaging normal  . Renal  disorder   . RLS (restless legs syndrome)   . Seizures ([place])    2004 past brain surgery  . Sinus complaint   . Thyroid disease   . TIA (transient ischemic attack)   . Tubular  adenoma of colon 01/2008    PAST SURGICAL HISTORY: Past Surgical History:  Procedure Laterality Date  . A/V SHUNT INTERVENTION N/A 08/13/2017   Procedure: A/V SHUNT INTERVENTION;  Surgeon: Algernon Huxley, MD;  Location: [place] [facility];  Service: Cardiovascular;  Laterality: N/A;  . A/V SHUNTOGRAM Left 08/13/2017   Procedure: A/V SHUNTOGRAM;  Surgeon: Algernon Huxley, MD;  Location: [place] [facility];  Service: Cardiovascular;  Laterality: Left;  . ABDOMINAL HYSTERECTOMY    . ANAL RECTAL MANOMETRY N/A 07/25/2015   Procedure: ANO RECTAL MANOMETRY;  Surgeon: Mauri Pole, MD;  Location: [facility];  Service: Endoscopy;  Laterality: N/A;  . APPENDECTOMY    . AV FISTULA PLACEMENT Left 11/11/2012   Procedure: INSERTION OF ARTERIOVENOUS (AV) GORE-TEX GRAFT ARM;  Surgeon: Angelia Mould, MD;  Location: [place];  Service: Vascular;  Laterality: Left;  . [place] REMOVAL Left 11/18/2012   Procedure: REMOVAL OF LEFT UPPER ARM ARTERIOVENOUS GORETEX GRAFT ([place]);  Surgeon: Angelia Mould, MD;  Location: [place];  Service: Vascular;  Laterality: Left;  . CARDIAC CATHETERIZATION  2003   normal  . CHOLECYSTECTOMY     Open mid line incision  . frontal craniotomy  2002   indication = sinusitis  . INSERTION OF DIALYSIS CATHETER     Left  . PATCH ANGIOPLASTY Left 11/18/2012   Procedure: PATCH ANGIOPLASTY;  Surgeon: Angelia Mould, MD;  Location: [place];  Service: Vascular;  Laterality: Left;  . PERIPHERAL VASCULAR CATHETERIZATION Left 03/08/2015   Procedure: A/V Shuntogram/Fistulagram;  Surgeon: Algernon Huxley, MD;  Location: [place] [facility];  Service: Cardiovascular;  Laterality: Left;  . PERIPHERAL VASCULAR CATHETERIZATION Left 03/08/2015   Procedure: A/V Shunt Intervention;  Surgeon: Algernon Huxley, MD;   Location: [place] [facility];  Service: Cardiovascular;  Laterality: Left;  . RECTAL ULTRASOUND N/A 10/05/2015   Procedure: ANAL ULTRASOUND WITH PROBE;  Surgeon: Leighton Ruff, MD;  Location: [facility];  Service: Endoscopy;  Laterality: N/A;  . REVISON OF ARTERIOVENOUS FISTULA  05/11/2012   Procedure: REVISON OF ARTERIOVENOUS FISTULA;  Surgeon: Elam Dutch, MD;  Location: [place];  Service: Vascular;  Laterality: Right;  . TUBAL LIGATION      FAMILY HISTORY: Family History  Problem Relation Age of Onset  . Thyroid cancer Mother   . Heart disease Father   . Hypertension Father   . Heart attack Father   . Lupus Daughter   . Breast cancer Sister   . Thyroid cancer Sister   . Kidney disease Sister   . Colon cancer Neg Hx     SOCIAL HISTORY: Social History   Socioeconomic History  . Marital status: Legally Separated    Spouse name: Not on file  . Number of children: 3  . Years of education: 69  . Highest education level: Not on file  Occupational History  . Occupation: retired  Scientific laboratory technician  . Financial resource strain: Not on file  . Food insecurity:    Worry: Not on file    Inability: Not on file  . Transportation needs:    Medical: Not on file    Non-medical: Not on file  Tobacco Use  . Smoking status: Current Every Day Smoker    Packs/day: 1.00    Years: 60.00    Pack years: 60.00    Types: Cigarettes  . Smokeless tobacco: Never Used  . Tobacco comment: smoker since 77 yo.  1.5 ppd of [place] 100 lights. ; form  given 10-23-16  Substance and Sexual Activity  . Alcohol use: No    Alcohol/week: 0.0 oz  . Drug use: No    Comment: No hx of IV drug use  . Sexual activity: Never    Birth control/protection: None  Lifestyle  . Physical activity:    Days per week: Not on file    Minutes per session: Not on file  . Stress: Not on file  Relationships  . Social connections:    Talks on phone: Not on file    Gets together: Not on file    Attends religious  service: Not on file    Active member of club or organization: Not on file    Attends meetings of clubs or organizations: Not on file    Relationship status: Not on file  . Intimate partner violence:    Fear of current or ex partner: Not on file    Emotionally abused: Not on file    Physically abused: Not on file    Forced sexual activity: Not on file  Other Topics Concern  . Not on file  Social History Narrative   ** Merged History Encounter **       Lives in [place] with Daughter, granddaughters (twins) and mentally challenged niece.   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   Patient has a college education.   Patient drinks one cup of caffeine daily.               PHYSICAL EXAM  Vitals:   11/05/17 1007  BP: (!) 130/43  Pulse: 60  Weight: 156 lb (70.8 kg)  Height: 5\' 1"  (1.549 m)   Body mass index is 29.48 kg/m.  Generalized: Well developed, pleasant elderly African-American female, in no acute distress   Neurological examination  Mentation: Alert oriented to time, place, history taking. Follows all commands speech and language fluent Cranial nerve II-XII: Pupils were equal round reactive to light. Extraocular movements were full, visual field were full on confrontational test. Facial sensation and strength were normal. Uvula tongue midline. Head turning and shoulder shrug  were normal and symmetric. Motor: The motor testing reveals 5 over 5 strength of all 4 extremities. Good symmetric motor tone is noted throughout.  Sensory: Decreased sensation distally in bilateral lower extremities.  Coordination: Cerebellar testing reveals good finger-nose-finger and heel-to-shin bilaterally.  Gait and station: Patient uses a cane when ambulating.  Tandem gait not attempted. Reflexes: Deep tendon reflexes are symmetric and normal bilaterally.   DIAGNOSTIC DATA (LABS, IMAGING, TESTING) - I reviewed patient records, labs, notes, testing and imaging myself where  available.  Lab Results  Component Value Date   WBC 12.4 (H) 08/20/2017   HGB 14.0 08/20/2017   HCT 43.2 08/20/2017   MCV 94.1 08/20/2017   PLT 200 08/20/2017      Component Value Date/Time   NA 135 08/20/2017 0928   NA 139 01/08/2017 1324   NA 136 07/07/2013 1334   K 4.7 08/20/2017 0928   K 4.0 07/07/2013 1334   CL 93 (L) 08/20/2017 0928   CL 100 07/07/2013 1334   CO2 26 08/20/2017 0928   CO2 29 07/07/2013 1334   GLUCOSE 93 08/20/2017 0928   GLUCOSE 75 07/07/2013 1334   BUN 16 08/20/2017 0928   BUN 13 01/08/2017 1324   BUN 30 (H) 07/07/2013 1334   CREATININE 7.13 (H) 08/20/2017 0928   CREATININE 7.07 (H) 07/07/2013 1334   CREATININE 4.92 (H) 03/11/2012 1218   CALCIUM  8.5 (L) 08/20/2017 0928   CALCIUM 10.0 07/07/2013 1334   CALCIUM 8.4 05/06/2010 0515   PROT 6.6 09/16/2017 1458   PROT 6.8 01/08/2017 1324   ALBUMIN 3.2 (L) 09/16/2017 1458   ALBUMIN 3.7 01/08/2017 1324   AST 17 09/16/2017 1458   ALT 10 09/16/2017 1458   ALKPHOS 70 09/16/2017 1458   BILITOT 0.4 09/16/2017 1458   BILITOT 0.2 01/08/2017 1324   GFRNONAA 5 (L) 08/20/2017 0928   GFRNONAA 5 (L) 07/07/2013 1334   GFRAA 6 (L) 08/20/2017 0928   GFRAA 6 (L) 07/07/2013 1334   Lab Results  Component Value Date   CHOL 84 07/24/2017   HDL 46 07/24/2017   LDLCALC 24 07/24/2017   LDLDIRECT 76 08/13/2011   TRIG 69 07/24/2017   CHOLHDL 1.8 07/24/2017   Lab Results  Component Value Date   HGBA1C 5.5 07/24/2017   Lab Results  Component Value Date   VITAMINB12 1,524 (H) 01/08/2017   Lab Results  Component Value Date   TSH 1.464 12/07/2016      ASSESSMENT AND PLAN 77 y.o. year old female  has a past medical history of Abnormality of gait (03/28/2015), Adrenal mass ([place]), ANEMIA NEC (03/31/2007), Arthritis, Back pain, CHF (congestive heart failure) ([facility]), Chronic kidney disease, Congestion of throat, Constipation, COPD (chronic obstructive pulmonary disease) ([place]), Depression, Dialysis patient ([place]),  Diverticulitis, GERD (gastroesophageal reflux disease), H/O hiatal hernia, High cholesterol, Hoarseness of voice, Hyperlipidemia, Hypertension, IBS (irritable bowel syndrome), Meralgia paresthetica of right side (12/26/2014), Normal cardiac stress test (12/24/2009), Renal disorder, RLS (restless legs syndrome), Seizures ([place]), Sinus complaint, Thyroid disease, TIA (transient ischemic attack), and Tubular adenoma of colon (01/2008). here with:  1.  Word finding issues 2.  Generalized weakness 3.  Peripheral neuropathy  The patient has continued complaints of peripheral neuropathy but unfortunately due to ESRD on HD patient is at max dose of Lyrica and Cymbalta.  As she has complaints of increased generalized weakness and increased difficulty ambulating, referral for home PT/OT was placed.  Patient currently receiving home aide 6 days a week through [facility].  Patient questioning there is any other type of pain medication she can take for her neuropathy pain but states she does not want to be on opioids.  She does take Tylenol without relief and as tramadol does help with her pain, it keeps her up at night.  Patient was advised to speak to nephrologist regarding safety of medications to be taking as it is recommended to take Tylenol at this time but this does not relieve pain.  It was recommended that patient schedule follow-up appointment with Jinny Blossom, NP in 6 months or call earlier if needed   Venancio Poisson, [facility]  [facility] [address] [place], [place] [address]  Phone [phone] Fax [phone]

## 2017-11-05 NOTE — Telephone Encounter (Signed)
Patient needs a 6 mo f/u w/ megan, np per [place], np. Patient states she will call us to schedule this.

## 2017-11-05 NOTE — Progress Notes (Signed)
I have read the note, and I agree with the clinical assessment and plan.  Charles K Willis   

## 2017-11-06 DIAGNOSIS — N186 End stage renal disease: Secondary | ICD-10-CM | POA: Diagnosis not present

## 2017-11-06 DIAGNOSIS — N2581 Secondary hyperparathyroidism of renal origin: Secondary | ICD-10-CM | POA: Diagnosis not present

## 2017-11-06 DIAGNOSIS — D631 Anemia in chronic kidney disease: Secondary | ICD-10-CM | POA: Diagnosis not present

## 2017-11-06 DIAGNOSIS — Z992 Dependence on renal dialysis: Secondary | ICD-10-CM | POA: Diagnosis not present

## 2017-11-09 DIAGNOSIS — Z992 Dependence on renal dialysis: Secondary | ICD-10-CM | POA: Diagnosis not present

## 2017-11-09 DIAGNOSIS — D631 Anemia in chronic kidney disease: Secondary | ICD-10-CM | POA: Diagnosis not present

## 2017-11-09 DIAGNOSIS — N2581 Secondary hyperparathyroidism of renal origin: Secondary | ICD-10-CM | POA: Diagnosis not present

## 2017-11-09 DIAGNOSIS — N186 End stage renal disease: Secondary | ICD-10-CM | POA: Diagnosis not present

## 2017-11-10 ENCOUNTER — Other Ambulatory Visit (INDEPENDENT_AMBULATORY_CARE_PROVIDER_SITE_OTHER): Payer: Self-pay

## 2017-11-10 DIAGNOSIS — I739 Peripheral vascular disease, unspecified: Secondary | ICD-10-CM

## 2017-11-11 DIAGNOSIS — N2581 Secondary hyperparathyroidism of renal origin: Secondary | ICD-10-CM | POA: Diagnosis not present

## 2017-11-11 DIAGNOSIS — N186 End stage renal disease: Secondary | ICD-10-CM | POA: Diagnosis not present

## 2017-11-11 DIAGNOSIS — D631 Anemia in chronic kidney disease: Secondary | ICD-10-CM | POA: Diagnosis not present

## 2017-11-11 DIAGNOSIS — Z992 Dependence on renal dialysis: Secondary | ICD-10-CM | POA: Diagnosis not present

## 2017-11-13 DIAGNOSIS — D631 Anemia in chronic kidney disease: Secondary | ICD-10-CM | POA: Diagnosis not present

## 2017-11-13 DIAGNOSIS — N186 End stage renal disease: Secondary | ICD-10-CM | POA: Diagnosis not present

## 2017-11-13 DIAGNOSIS — N2581 Secondary hyperparathyroidism of renal origin: Secondary | ICD-10-CM | POA: Diagnosis not present

## 2017-11-13 DIAGNOSIS — Z992 Dependence on renal dialysis: Secondary | ICD-10-CM | POA: Diagnosis not present

## 2017-11-16 DIAGNOSIS — Z992 Dependence on renal dialysis: Secondary | ICD-10-CM | POA: Diagnosis not present

## 2017-11-16 DIAGNOSIS — D631 Anemia in chronic kidney disease: Secondary | ICD-10-CM | POA: Diagnosis not present

## 2017-11-16 DIAGNOSIS — N2581 Secondary hyperparathyroidism of renal origin: Secondary | ICD-10-CM | POA: Diagnosis not present

## 2017-11-16 DIAGNOSIS — N186 End stage renal disease: Secondary | ICD-10-CM | POA: Diagnosis not present

## 2017-11-18 DIAGNOSIS — N186 End stage renal disease: Secondary | ICD-10-CM | POA: Diagnosis not present

## 2017-11-18 DIAGNOSIS — N2581 Secondary hyperparathyroidism of renal origin: Secondary | ICD-10-CM | POA: Diagnosis not present

## 2017-11-18 DIAGNOSIS — D631 Anemia in chronic kidney disease: Secondary | ICD-10-CM | POA: Diagnosis not present

## 2017-11-18 DIAGNOSIS — Z992 Dependence on renal dialysis: Secondary | ICD-10-CM | POA: Diagnosis not present

## 2017-11-20 DIAGNOSIS — Z992 Dependence on renal dialysis: Secondary | ICD-10-CM | POA: Diagnosis not present

## 2017-11-20 DIAGNOSIS — N186 End stage renal disease: Secondary | ICD-10-CM | POA: Diagnosis not present

## 2017-11-20 DIAGNOSIS — D631 Anemia in chronic kidney disease: Secondary | ICD-10-CM | POA: Diagnosis not present

## 2017-11-20 DIAGNOSIS — N2581 Secondary hyperparathyroidism of renal origin: Secondary | ICD-10-CM | POA: Diagnosis not present

## 2017-11-23 DIAGNOSIS — N2581 Secondary hyperparathyroidism of renal origin: Secondary | ICD-10-CM | POA: Diagnosis not present

## 2017-11-23 DIAGNOSIS — N186 End stage renal disease: Secondary | ICD-10-CM | POA: Diagnosis not present

## 2017-11-23 DIAGNOSIS — Z992 Dependence on renal dialysis: Secondary | ICD-10-CM | POA: Diagnosis not present

## 2017-11-23 DIAGNOSIS — D631 Anemia in chronic kidney disease: Secondary | ICD-10-CM | POA: Diagnosis not present

## 2017-11-24 ENCOUNTER — Ambulatory Visit
Admission: RE | Admit: 2017-11-24 | Discharge: 2017-11-24 | Disposition: A | Discharge status: Home / Self Care | Payer: Medicare Other | Source: Ambulatory Visit | Attending: Vascular Surgery | Admitting: Vascular Surgery

## 2017-11-24 ENCOUNTER — Ambulatory Visit: Payer: Medicare Other | Admitting: Adult Health

## 2017-11-24 DIAGNOSIS — I739 Peripheral vascular disease, unspecified: Secondary | ICD-10-CM | POA: Insufficient documentation

## 2017-11-25 DIAGNOSIS — D631 Anemia in chronic kidney disease: Secondary | ICD-10-CM | POA: Diagnosis not present

## 2017-11-25 DIAGNOSIS — N186 End stage renal disease: Secondary | ICD-10-CM | POA: Diagnosis not present

## 2017-11-25 DIAGNOSIS — N2581 Secondary hyperparathyroidism of renal origin: Secondary | ICD-10-CM | POA: Diagnosis not present

## 2017-11-25 DIAGNOSIS — Z992 Dependence on renal dialysis: Secondary | ICD-10-CM | POA: Diagnosis not present

## 2017-11-27 DIAGNOSIS — N186 End stage renal disease: Secondary | ICD-10-CM | POA: Diagnosis not present

## 2017-11-27 DIAGNOSIS — N2581 Secondary hyperparathyroidism of renal origin: Secondary | ICD-10-CM | POA: Diagnosis not present

## 2017-11-27 DIAGNOSIS — Z992 Dependence on renal dialysis: Secondary | ICD-10-CM | POA: Diagnosis not present

## 2017-11-27 DIAGNOSIS — D631 Anemia in chronic kidney disease: Secondary | ICD-10-CM | POA: Diagnosis not present

## 2017-11-30 DIAGNOSIS — N186 End stage renal disease: Secondary | ICD-10-CM | POA: Diagnosis not present

## 2017-11-30 DIAGNOSIS — N2581 Secondary hyperparathyroidism of renal origin: Secondary | ICD-10-CM | POA: Diagnosis not present

## 2017-11-30 DIAGNOSIS — Z992 Dependence on renal dialysis: Secondary | ICD-10-CM | POA: Diagnosis not present

## 2017-11-30 DIAGNOSIS — E1129 Type 2 diabetes mellitus with other diabetic kidney complication: Secondary | ICD-10-CM | POA: Diagnosis not present

## 2017-11-30 DIAGNOSIS — D631 Anemia in chronic kidney disease: Secondary | ICD-10-CM | POA: Diagnosis not present

## 2017-12-01 ENCOUNTER — Ambulatory Visit (INDEPENDENT_AMBULATORY_CARE_PROVIDER_SITE_OTHER): Payer: Medicare Other | Admitting: Vascular Surgery

## 2017-12-01 ENCOUNTER — Encounter (INDEPENDENT_AMBULATORY_CARE_PROVIDER_SITE_OTHER): Payer: Self-pay | Admitting: Vascular Surgery

## 2017-12-01 VITALS — BP 117/47 | HR 66 | Resp 12 | Ht 61.0 in | Wt 157.0 lb

## 2017-12-01 DIAGNOSIS — Z992 Dependence on renal dialysis: Secondary | ICD-10-CM

## 2017-12-01 DIAGNOSIS — E785 Hyperlipidemia, unspecified: Secondary | ICD-10-CM

## 2017-12-01 DIAGNOSIS — I70223 Atherosclerosis of native arteries of extremities with rest pain, bilateral legs: Secondary | ICD-10-CM | POA: Diagnosis not present

## 2017-12-01 DIAGNOSIS — I158 Other secondary hypertension: Secondary | ICD-10-CM | POA: Diagnosis not present

## 2017-12-01 DIAGNOSIS — N186 End stage renal disease: Secondary | ICD-10-CM | POA: Diagnosis not present

## 2017-12-01 DIAGNOSIS — I70229 Atherosclerosis of native arteries of extremities with rest pain, unspecified extremity: Secondary | ICD-10-CM | POA: Insufficient documentation

## 2017-12-01 NOTE — Assessment & Plan Note (Signed)
lipid control important in reducing the progression of atherosclerotic disease. Continue statin therapy  

## 2017-12-01 NOTE — Progress Notes (Signed)
MRN : [id number]  [name] is a 77 y.o. (April 10, 1941) female who presents with chief complaint of  Chief Complaint  Patient presents with  . Follow-up    ABI follow up  .  History of Present Illness: Patient returns today in follow up of leg pain after noninvasive studies were ordered by her nephrologist.  She describes having some sloughing of the skin of her feet.  She describes terrible burning pain in her feet and the balls of her foot down into her toes.  She has been placed on Lyrica with very minimal improvement.  Both legs are affected, but the right leg is clearly the more severely affected leg per her report.  She had toenails removed from the great toes of both feet over a year ago and these have been very poor to heal and she has them covered with Band-Aids today.  No fevers or chills.  No spontaneous ulcerations.  The pain is significant and her walking has gotten much worse.  Current Outpatient Medications  Medication Sig Dispense Refill  . albuterol (PROVENTIL HFA;VENTOLIN HFA) 108 (90 Base) MCG/ACT inhaler Inhale 2 puffs into the lungs every 4 (four) hours as needed for wheezing or shortness of breath. 1 Inhaler 1  . allopurinol (ZYLOPRIM) 100 MG tablet Take 100 mg by mouth once daily on non-dialysis days (Tues/Thurs/Sat/Sun) (Patient taking differently: Take 100 mg by mouth See admin instructions. Take one tablet (100 mg) by mouth once daily on non-dialysis days (Tues/Thurs/Sat/Sun)) 30 tablet 0  . amLODipine (NORVASC) 10 MG tablet TAKE 1 TABLET BY MOUTH AT BEDTIME (Patient taking differently: TAKE 1 TABLET (10 MG) BY MOUTH AT BEDTIME) 90 tablet 1  . aspirin EC 81 MG tablet Take 81 mg by mouth daily.     Marland Kitchen atorvastatin (LIPITOR) 40 MG tablet Take 1 tablet (40 mg total) by mouth daily. (Patient taking differently: Take 40 mg by mouth at bedtime. ) 90 tablet 3  . azelastine (ASTELIN) 0.1 % nasal spray Place 1 spray into both nostrils 2 (two) times daily. Use in each nostril as  directed 30 mL 6  . B Complex-C-Zn-Folic Acid (DIALYVITE/ZINC) TABS Take 1 tablet by mouth daily.  6  . budesonide-formoterol (SYMBICORT) 160-4.5 MCG/ACT inhaler Inhale 2 puffs into the lungs 2 (two) times daily. 1 Inhaler 12  . cinacalcet (SENSIPAR) 60 MG tablet Take 60 mg by mouth daily.    . cinacalcet (SENSIPAR) 90 MG tablet Take 90 mg by mouth at bedtime.    . cloNIDine (CATAPRES) 0.2 MG tablet   5  . dicyclomine (BENTYL) 10 MG capsule Take 1 capsule (10 mg total) by mouth 4 (four) times daily -  before meals and at bedtime. 120 capsule 1  . DULoxetine (CYMBALTA) 30 MG capsule Take 2 capsules (60 mg total) by mouth daily. (Patient taking differently: Take 30 mg by mouth at bedtime. ) 180 capsule 3  . fluticasone (FLONASE) 50 MCG/ACT nasal spray Place 2 sprays into both nostrils daily as needed for allergies or rhinitis. 16 g 3  . glycopyrrolate (ROBINUL) 2 MG tablet Take 1 tablet (2 mg total) by mouth 2 (two) times daily. 60 tablet 11  . hydrOXYzine (ATARAX/VISTARIL) 25 MG tablet Take 1 tablet (25 mg total) by mouth every 8 (eight) hours as needed for itching. 30 tablet 1  . lidocaine-prilocaine (EMLA) cream Apply 1 application topically See admin instructions. Apply topically one hour before dialysis - Monday, Wednesday, Friday    . loratadine (CLARITIN) 10  MG tablet 10 mg every other day. 45 tablet 1  . losartan (COZAAR) 100 MG tablet Take 1 tablet (100 mg total) by mouth daily. 90 tablet 1  . metoprolol succinate (TOPROL-XL) 100 MG 24 hr tablet TAKE 1 TABLET (100 MG)  BY MOUTH TWICE DAILY 180 tablet 3  . multivitamin (RENA-VIT) TABS tablet Take 1 tablet by mouth at bedtime. (Patient taking differently: Take 1 tablet by mouth daily. ) 60 tablet 5  . nystatin (MYCOSTATIN) 100000 UNIT/ML suspension TAKE 5 MLS BY MOUTH 4 TIMES A DAY SWISH AND SWALLOW  0  . pantoprazole (PROTONIX) 20 MG tablet Take 1 tablet (20 mg total) by mouth 2 (two) times daily before a meal. 60 tablet 1  . Pramoxine-Benzyl  Alcohol (ITCH-X) 1-10 % GEL Apply 1 application topically 3 (three) times daily as needed. 70.5 g 0  . pregabalin (LYRICA) 100 MG capsule Take 100 mg by mouth at bedtime.     . sevelamer carbonate (RENVELA) 800 MG tablet Take 2,400 mg by mouth See admin instructions. Take 3 tablets (2400 mg) by mouth with each meal or snack - once or twice daily     No current facility-administered medications for this visit.     Past Medical History:  Diagnosis Date  . Abnormality of gait 03/28/2015  . Adrenal mass ([place])   . 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 Arthritis   . Back pain   . CHF (congestive heart failure) ([place])   . Chronic kidney disease    Hemo MWF  . Congestion of throat    Pt states she has a lot mucus in back throat.  . Constipation   . COPD (chronic obstructive pulmonary disease) ([place])   . Depression   . Dialysis patient [facility])    kidney  . Diverticulitis   . GERD (gastroesophageal reflux disease)   . H/O hiatal hernia   . High cholesterol   . Hoarseness of voice   . Hyperlipidemia   . Hypertension   . IBS (irritable bowel syndrome)   . Meralgia paresthetica of right side 12/26/2014  . Normal cardiac stress test 12/24/2009   lexiscan, imaging normal  . Renal disorder   . RLS (restless legs syndrome)   . Seizures ([place])    2004 past brain surgery  . Sinus complaint   . Thyroid disease   . TIA (transient ischemic attack)   . Tubular adenoma of colon 01/2008    Past Surgical History:  Procedure Laterality Date  . A/V SHUNT INTERVENTION N/A 08/13/2017   Procedure: A/V SHUNT INTERVENTION;  Surgeon: Algernon Huxley, MD;  Location: [place] [facility];  Service: Cardiovascular;  Laterality: N/A;  . A/V SHUNTOGRAM Left 08/13/2017   Procedure: A/V SHUNTOGRAM;  Surgeon: Algernon Huxley, MD;  Location: [place] [facility];  Service: Cardiovascular;  Laterality: Left;  . ABDOMINAL HYSTERECTOMY    . ANAL RECTAL MANOMETRY N/A 07/25/2015    Procedure: ANO RECTAL MANOMETRY;  Surgeon: Mauri Pole, MD;  Location: [facility];  Service: Endoscopy;  Laterality: N/A;  . APPENDECTOMY    . AV FISTULA PLACEMENT Left 11/11/2012   Procedure: INSERTION OF ARTERIOVENOUS (AV) GORE-TEX GRAFT ARM;  Surgeon: Angelia Mould, MD;  Location: [place];  Service: Vascular;  Laterality: Left;  . [place] REMOVAL Left 11/18/2012   Procedure: REMOVAL OF LEFT UPPER ARM ARTERIOVENOUS GORETEX GRAFT ([place]);  Surgeon: Angelia Mould, MD;  Location: [place];  Service: Vascular;  Laterality: Left;  . CARDIAC CATHETERIZATION  2003   normal  . CHOLECYSTECTOMY     Open mid line incision  . frontal craniotomy  2002   indication = sinusitis  . INSERTION OF DIALYSIS CATHETER     Left  . PATCH ANGIOPLASTY Left 11/18/2012   Procedure: PATCH ANGIOPLASTY;  Surgeon: Angelia Mould, MD;  Location: [place];  Service: Vascular;  Laterality: Left;  . PERIPHERAL VASCULAR CATHETERIZATION Left 03/08/2015   Procedure: A/V Shuntogram/Fistulagram;  Surgeon: Algernon Huxley, MD;  Location: [place] [facility];  Service: Cardiovascular;  Laterality: Left;  . PERIPHERAL VASCULAR CATHETERIZATION Left 03/08/2015   Procedure: A/V Shunt Intervention;  Surgeon: Algernon Huxley, MD;  Location: [place] [facility];  Service: Cardiovascular;  Laterality: Left;  . RECTAL ULTRASOUND N/A 10/05/2015   Procedure: ANAL ULTRASOUND WITH PROBE;  Surgeon: Leighton Ruff, MD;  [facility];  Service: Endoscopy;  Laterality: N/A;  . REVISON OF ARTERIOVENOUS FISTULA  05/11/2012   Procedure: REVISON OF ARTERIOVENOUS FISTULA;  Surgeon: Elam Dutch, MD;  Location: [place];  Service: Vascular;  Laterality: Right;  . TUBAL LIGATION      Social History Social History   Tobacco Use  . Smoking status: Current Every Day Smoker    Packs/day: 1.00    Years: 60.00    Pack years: 60.00    Types: Cigarettes  . Smokeless tobacco: Never Used  . Tobacco comment: smoker since 77 yo.  1.5 ppd  of [place] 100 lights. ; form given 10-23-16  Substance Use Topics  . Alcohol use: No    Alcohol/week: 0.0 oz  . Drug use: No    Comment: No hx of IV drug use     Family History Family History  Problem Relation Age of Onset  . Thyroid cancer Mother   . Heart disease Father   . Hypertension Father   . Heart attack Father   . Lupus Daughter   . Breast cancer Sister   . Thyroid cancer Sister   . Kidney disease Sister   . Colon cancer Neg Hx      Allergies  Allergen Reactions  . Tuberculin Tests Hives    "blisters"  . Valacyclovir Other (See Comments)    Confusion and nervousness  . Codeine Nausea And Vomiting  . Penicillins Rash    No problems breathing. Has tolerated omnicef in past without issue Has patient had a PCN reaction causing immediate rash, facial/tongue/throat swelling, SOB or lightheadedness with hypotension: Yes Has patient had a PCN reaction causing severe rash involving mucus membranes or skin necrosis: No Has patient had a PCN reaction that required hospitalization No Has patient had a PCN reaction occurring within the last 10 years: No If all of the above answers are "NO", then may proceed with Cephalosporin  . Sulfamethoxazole Rash     REVIEW OF SYSTEMS (Negative unless checked)  Constitutional: []Weight loss  []Fever  []Chills Cardiac: []Chest pain   []Chest pressure   []Palpitations   []Shortness of breath when laying flat   []Shortness of breath at rest   []Shortness of breath with exertion. Vascular:  [x]Pain in legs with walking   []Pain in legs at rest   []Pain in legs when laying flat   [x]Claudication   []Pain in feet when walking  [x]Pain in feet at rest  []Pain in feet when laying flat   []History of DVT   []Phlebitis   []Swelling in legs   []Varicose veins   []  Non-healing ulcers Pulmonary:   []Uses home oxygen   []Productive cough   []Hemoptysis   []Wheeze  []COPD   []Asthma Neurologic:  []Dizziness  []Blackouts   []Seizures   []History of  stroke   []History of TIA  []Aphasia   []Temporary blindness   []Dysphagia   []Weakness or numbness in arms   []Weakness or numbness in legs Musculoskeletal:  [x]Arthritis   []Joint swelling   []Joint pain   []Low back pain Hematologic:  []Easy bruising  []Easy bleeding   []Hypercoagulable state   [x]Anemic   Gastrointestinal:  [x]Blood in stool   []Vomiting blood  []Gastroesophageal reflux/heartburn   []Abdominal pain Genitourinary:  []Chronic kidney disease   []Difficult urination  []Frequent urination  []Burning with urination   []Hematuria Skin:  []Rashes   []Ulcers   []Wounds Psychological:  []History of anxiety   [] History of major depression.  Physical Examination  BP (!) 117/47 (BP Location: Right Arm, Patient Position: Sitting)   Pulse 66   Resp 12   Ht 5' 1" (1.549 m)   Wt 157 lb (71.2 kg)   BMI 29.66 kg/m  Gen:  WD/WN, NAD Head: /AT, No temporalis wasting. Ear/Nose/Throat: Hearing grossly intact, nares w/o erythema or drainage Eyes: Conjunctiva clear. Sclera non-icteric Neck: Supple.  Trachea midline Pulmonary:  Good air movement, no use of accessory muscles.  Cardiac: RRR, no JVD Vascular: Good thrill is present in left upper arm access Vessel Right Left  Radial Palpable Palpable                          PT  not palpable  not palpable  DP  not palpable  1+ palpable    Musculoskeletal: M/S 5/5 throughout.  No deformity or atrophy.  Trace lower extremity edema. Neurologic: Sensation grossly intact in extremities.  Symmetrical.  Speech is fluent.  Psychiatric: Judgment intact, Mood & affect appropriate for pt's clinical situation. Dermatologic: No rashes or ulcers noted.  No cellulitis or open wounds.       Labs Recent Results (from the past 2160 hour(s))  Gastrointestinal Pathogen Panel PCR     Status: None   Collection Time: 09/08/17 11:19 AM  Result Value Ref Range   Campylobacter, PCR NOT DETECTED NOT DETECT   C. difficile Tox A/B, PCR NOT DETECTED  NOT DETECT   E coli 0157, PCR NOT DETECTED NOT DETECT   E coli (ETEC) LT/ST PCR NOT DETECTED NOT DETECT   E coli (STEC) stx1/stx2, PCR NOT DETECTED NOT DETECT   Salmonella, PCR NOT DETECTED NOT DETECT   Shigella, PCR NOT DETECTED NOT DETECT   Norovirus, PCR NOT DETECTED NOT DETECT   Rotavirus A, PCR NOT DETECTED NOT DETECT   Giardia lamblia, PCR NOT DETECTED NOT DETECT   Cryptosporidium, PCR NOT DETECTED NOT DETECT    Comment: . The xTAG(R) Gastrointestinal Pathogen Panel results are presumptive and must be confirmed by FDA-cleared tests or other acceptable reference methods. The results of this test should not be used as the sole basis for diagnosis, treatment, or other patient management decisions. . Performed using the [employer] xTAG(R) Gastrointestinal Pathogen Panel test kit.   Hepatic function panel     Status: Abnormal   Collection Time: 09/16/17  2:58 PM  Result Value Ref Range   Total Bilirubin 0.4 0.2 - 1.2 mg/dL   Bilirubin, Direct 0.1 0.0 - 0.3 mg/dL   Alkaline Phosphatase 70 39 - 117 U/L   AST 17 0 -  37 U/L   ALT 10 0 - 35 U/L   Total Protein 6.6 6.0 - 8.3 g/dL   Albumin 3.2 (L) 3.5 - 5.2 g/dL    Radiology US Arterial Seg Multiple  Result Date: 11/24/2017 CLINICAL DATA:  77 year old female with a history of PAD. Cardiovascular risk factors include smoking, hypertension, hyperlipidemia, known vascular surgery, renal failure EXAM: NONINVASIVE PHYSIOLOGIC VASCULAR STUDY OF BILATERAL LOWER EXTREMITIES TECHNIQUE: Evaluation of both lower extremities was performed at rest, including calculation of ankle-brachial indices, multiple segmental pressure evaluation, segmental Doppler and segmental pulse volume recording. COMPARISON:  No prior duplex FINDINGS: Right: Resting ankle brachial index:  0.37 Segmental blood pressure: Right upper extremity pressure 088 systolic. Decrease to the right thigh. Significant drop to the low thigh. Ankle pressure measures 31 systolic. Doppler:  Segmental Doppler of the right lower extremity demonstrates monophasic waveform throughout. Pulse volume recording: Segmental PVR of the right lower extremity demonstrates loss of augmentation below knee with deterioration of the waveform throughout. Left: Resting ankle brachial index: 0.70 Segmental blood pressure: Segmental blood pressure of the left arm was not measured. Asymmetric pressure in the thigh. Significant drop below knee. Doppler: Segmental Doppler of the left lower extremity monophasic throughout. Pulse volume recording: Segmental PVR of the left lower extremity demonstrates waveform amplitude maintained with no deterioration at the ankle. 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 Segmental exam demonstrates multi segment disease including at least iliac and femoropopliteal disease. Left: Resting ABI in the mild range of arterial occlusive disease. This may be falsely elevated given the appearance of the segmental exam. Segmental exam demonstrates multi segment disease including at least iliac and femoropopliteal disease. Signed, Dulcy Fanny. Dellia Nims, RPVI [facility] Radiology Electronically Signed   By: Corrie Mckusick D.O.   On: 11/24/2017 15:03    Assessment/Plan Hypertension blood pressure control important in reducing the progression of atherosclerotic disease. On appropriate oral medications.   End stage renal disease on dialysis [facility]) The patient has been having worsening issues with this requiring multiple interventions.  She does not have any skin threat.  She has no prolonged bleeding.  She does not have a strong and hard indication for surgical revision   Hyperlipidemia lipid control important in reducing the progression of atherosclerotic disease. Continue statin therapy   Atherosclerosis of native arteries of extremity with rest pain [facility]) The patient describes symptoms  consistent with ischemic rest pain of the right foot and potentially the left as well although it is not quite as severe.  Her ABIs were 0.37 on the right and 0.70 on the left.  Recommend:  The patient has evidence of severe atherosclerotic changes of both lower extremities with rest pain that is associated with preulcerative changes and impending tissue loss of the foot.  This represents a limb threatening ischemia and places the patient at the risk for limb loss.  Patient should undergo angiography of the lower extremities (right first) with the hope for intervention for limb salvage.  The risks and benefits as well as the alternative therapies was discussed in detail with the patient.  All questions were answered.  Patient agrees to proceed with angiography.  The patient will follow up with me in the office after the procedure.         Leotis Pain, MD  12/01/2017 10:36 AM    This note was created with Dragon medical transcription system.  Any errors from dictation are purely unintentional

## 2017-12-01 NOTE — Patient Instructions (Signed)

## 2017-12-01 NOTE — Assessment & Plan Note (Signed)
The patient describes symptoms consistent with ischemic rest pain of the right foot and potentially the left as well although it is not quite as severe.  Her ABIs were 0.37 on the right and 0.70 on the left.  Recommend:  The patient has evidence of severe atherosclerotic changes of both lower extremities with rest pain that is associated with preulcerative changes and impending tissue loss of the foot.  This represents a limb threatening ischemia and places the patient at the risk for limb loss.  Patient should undergo angiography of the lower extremities (right first) with the hope for intervention for limb salvage.  The risks and benefits as well as the alternative therapies was discussed in detail with the patient.  All questions were answered.  Patient agrees to proceed with angiography.  The patient will follow up with me in the office after the procedure.

## 2017-12-02 ENCOUNTER — Encounter (INDEPENDENT_AMBULATORY_CARE_PROVIDER_SITE_OTHER): Payer: Self-pay

## 2017-12-04 DIAGNOSIS — N186 End stage renal disease: Secondary | ICD-10-CM | POA: Diagnosis not present

## 2017-12-04 DIAGNOSIS — Z992 Dependence on renal dialysis: Secondary | ICD-10-CM | POA: Diagnosis not present

## 2017-12-04 DIAGNOSIS — D631 Anemia in chronic kidney disease: Secondary | ICD-10-CM | POA: Diagnosis not present

## 2017-12-04 DIAGNOSIS — N2581 Secondary hyperparathyroidism of renal origin: Secondary | ICD-10-CM | POA: Diagnosis not present

## 2017-12-07 ENCOUNTER — Telehealth: Payer: Self-pay | Admitting: *Deleted

## 2017-12-07 ENCOUNTER — Telehealth (INDEPENDENT_AMBULATORY_CARE_PROVIDER_SITE_OTHER): Payer: Self-pay

## 2017-12-07 DIAGNOSIS — D631 Anemia in chronic kidney disease: Secondary | ICD-10-CM | POA: Diagnosis not present

## 2017-12-07 DIAGNOSIS — Z992 Dependence on renal dialysis: Secondary | ICD-10-CM | POA: Diagnosis not present

## 2017-12-07 DIAGNOSIS — N186 End stage renal disease: Secondary | ICD-10-CM | POA: Diagnosis not present

## 2017-12-07 DIAGNOSIS — N2581 Secondary hyperparathyroidism of renal origin: Secondary | ICD-10-CM | POA: Diagnosis not present

## 2017-12-07 NOTE — Telephone Encounter (Signed)
Tramadol was called into [facility] on [address] and patient was inform

## 2017-12-07 NOTE — Telephone Encounter (Signed)
Pt is asking what the long term affects of not having the surgery could take on her body Is it dangerous to not have the surgery?

## 2017-12-07 NOTE — Telephone Encounter (Signed)
Message sent to Dr. Jordan for review. 

## 2017-12-07 NOTE — Telephone Encounter (Signed)
Copied from [place] [id number]. Topic: Inquiry >> Dec 07, 2017  7:36 AM [name] wrote: Reason for CRM: Patient called wanting Dr. Martinique to know that she has been diagnosed with Peripheral Artery Disease. Patient is schedule for Surgery on December 17, 2017 at [facility] with Dr. Lucky Cowboy. Please call patient.        Thank You!!!

## 2017-12-08 NOTE — Telephone Encounter (Signed)
I assumed the risks and benefits of the procedure have been discussed with her. Depending of severity of PAD, ulcers that do not heal can form,increasing risk of infections.  Thanks, BJ

## 2017-12-09 DIAGNOSIS — D631 Anemia in chronic kidney disease: Secondary | ICD-10-CM | POA: Diagnosis not present

## 2017-12-09 DIAGNOSIS — N2581 Secondary hyperparathyroidism of renal origin: Secondary | ICD-10-CM | POA: Diagnosis not present

## 2017-12-09 DIAGNOSIS — Z992 Dependence on renal dialysis: Secondary | ICD-10-CM | POA: Diagnosis not present

## 2017-12-09 DIAGNOSIS — N186 End stage renal disease: Secondary | ICD-10-CM | POA: Diagnosis not present

## 2017-12-09 NOTE — Telephone Encounter (Signed)
Left message for patient to give clinic a call back. 

## 2017-12-11 DIAGNOSIS — Z992 Dependence on renal dialysis: Secondary | ICD-10-CM | POA: Diagnosis not present

## 2017-12-11 DIAGNOSIS — N186 End stage renal disease: Secondary | ICD-10-CM | POA: Diagnosis not present

## 2017-12-11 DIAGNOSIS — N2581 Secondary hyperparathyroidism of renal origin: Secondary | ICD-10-CM | POA: Diagnosis not present

## 2017-12-11 DIAGNOSIS — D631 Anemia in chronic kidney disease: Secondary | ICD-10-CM | POA: Diagnosis not present

## 2017-12-14 ENCOUNTER — Other Ambulatory Visit (INDEPENDENT_AMBULATORY_CARE_PROVIDER_SITE_OTHER): Payer: Self-pay | Admitting: Vascular Surgery

## 2017-12-14 DIAGNOSIS — N2581 Secondary hyperparathyroidism of renal origin: Secondary | ICD-10-CM | POA: Diagnosis not present

## 2017-12-14 DIAGNOSIS — D631 Anemia in chronic kidney disease: Secondary | ICD-10-CM | POA: Diagnosis not present

## 2017-12-14 DIAGNOSIS — N186 End stage renal disease: Secondary | ICD-10-CM | POA: Diagnosis not present

## 2017-12-14 DIAGNOSIS — Z992 Dependence on renal dialysis: Secondary | ICD-10-CM | POA: Diagnosis not present

## 2017-12-15 ENCOUNTER — Encounter
Admission: RE | Admit: 2017-12-15 | Discharge: 2017-12-15 | Disposition: A | Discharge status: Home / Self Care | Payer: Medicare Other | Source: Ambulatory Visit | Attending: Vascular Surgery | Admitting: Vascular Surgery

## 2017-12-15 DIAGNOSIS — E039 Hypothyroidism, unspecified: Secondary | ICD-10-CM | POA: Diagnosis not present

## 2017-12-15 DIAGNOSIS — F17210 Nicotine dependence, cigarettes, uncomplicated: Secondary | ICD-10-CM | POA: Diagnosis not present

## 2017-12-15 DIAGNOSIS — Z9049 Acquired absence of other specified parts of digestive tract: Secondary | ICD-10-CM | POA: Diagnosis not present

## 2017-12-15 DIAGNOSIS — G2581 Restless legs syndrome: Secondary | ICD-10-CM | POA: Diagnosis not present

## 2017-12-15 DIAGNOSIS — N186 End stage renal disease: Secondary | ICD-10-CM | POA: Diagnosis not present

## 2017-12-15 DIAGNOSIS — M1990 Unspecified osteoarthritis, unspecified site: Secondary | ICD-10-CM | POA: Diagnosis not present

## 2017-12-15 DIAGNOSIS — Z882 Allergy status to sulfonamides status: Secondary | ICD-10-CM | POA: Diagnosis not present

## 2017-12-15 DIAGNOSIS — Z90710 Acquired absence of both cervix and uterus: Secondary | ICD-10-CM | POA: Diagnosis not present

## 2017-12-15 DIAGNOSIS — Z8673 Personal history of transient ischemic attack (TIA), and cerebral infarction without residual deficits: Secondary | ICD-10-CM | POA: Diagnosis not present

## 2017-12-15 DIAGNOSIS — J449 Chronic obstructive pulmonary disease, unspecified: Secondary | ICD-10-CM | POA: Diagnosis not present

## 2017-12-15 DIAGNOSIS — K219 Gastro-esophageal reflux disease without esophagitis: Secondary | ICD-10-CM | POA: Diagnosis not present

## 2017-12-15 DIAGNOSIS — Z888 Allergy status to other drugs, medicaments and biological substances status: Secondary | ICD-10-CM | POA: Diagnosis not present

## 2017-12-15 DIAGNOSIS — Z9889 Other specified postprocedural states: Secondary | ICD-10-CM | POA: Diagnosis not present

## 2017-12-15 DIAGNOSIS — I70221 Atherosclerosis of native arteries of extremities with rest pain, right leg: Secondary | ICD-10-CM | POA: Diagnosis not present

## 2017-12-15 DIAGNOSIS — Z8249 Family history of ischemic heart disease and other diseases of the circulatory system: Secondary | ICD-10-CM | POA: Diagnosis not present

## 2017-12-15 DIAGNOSIS — Z992 Dependence on renal dialysis: Secondary | ICD-10-CM | POA: Diagnosis not present

## 2017-12-15 DIAGNOSIS — E785 Hyperlipidemia, unspecified: Secondary | ICD-10-CM | POA: Diagnosis not present

## 2017-12-15 DIAGNOSIS — I509 Heart failure, unspecified: Secondary | ICD-10-CM | POA: Diagnosis not present

## 2017-12-15 DIAGNOSIS — Z885 Allergy status to narcotic agent status: Secondary | ICD-10-CM | POA: Diagnosis not present

## 2017-12-15 DIAGNOSIS — K589 Irritable bowel syndrome without diarrhea: Secondary | ICD-10-CM | POA: Diagnosis not present

## 2017-12-15 DIAGNOSIS — I132 Hypertensive heart and chronic kidney disease with heart failure and with stage 5 chronic kidney disease, or end stage renal disease: Secondary | ICD-10-CM | POA: Diagnosis not present

## 2017-12-15 DIAGNOSIS — Z880 Allergy status to penicillin: Secondary | ICD-10-CM | POA: Diagnosis not present

## 2017-12-15 HISTORY — DX: Pneumonia, unspecified organism: J18.9

## 2017-12-15 HISTORY — DX: Cerebral infarction, unspecified: I63.9

## 2017-12-15 LAB — POTASSIUM: Potassium: 4.1 mmol/L (ref 3.5–5.1)

## 2017-12-15 NOTE — Telephone Encounter (Signed)
Patient informed and verbalized understanding

## 2017-12-16 DIAGNOSIS — D631 Anemia in chronic kidney disease: Secondary | ICD-10-CM | POA: Diagnosis not present

## 2017-12-16 DIAGNOSIS — N2581 Secondary hyperparathyroidism of renal origin: Secondary | ICD-10-CM | POA: Diagnosis not present

## 2017-12-16 DIAGNOSIS — Z992 Dependence on renal dialysis: Secondary | ICD-10-CM | POA: Diagnosis not present

## 2017-12-16 DIAGNOSIS — N186 End stage renal disease: Secondary | ICD-10-CM | POA: Diagnosis not present

## 2017-12-16 MED ORDER — CLINDAMYCIN PHOSPHATE 300 MG/50ML IV SOLN
300.0000 mg | Freq: Once | INTRAVENOUS | Status: AC
  Administered 2017-12-17: 300 mg via INTRAVENOUS

## 2017-12-17 ENCOUNTER — Ambulatory Visit
Admission: RE | Admit: 2017-12-17 | Discharge: 2017-12-17 | Disposition: A | Discharge status: Home / Self Care | Payer: Medicare Other | Source: Ambulatory Visit | Attending: Vascular Surgery | Admitting: Vascular Surgery

## 2017-12-17 ENCOUNTER — Encounter
Admission: RE | Disposition: A | Discharge status: Home / Self Care | Payer: Self-pay | Source: Ambulatory Visit | Attending: Vascular Surgery

## 2017-12-17 DIAGNOSIS — Z992 Dependence on renal dialysis: Secondary | ICD-10-CM | POA: Insufficient documentation

## 2017-12-17 DIAGNOSIS — Z885 Allergy status to narcotic agent status: Secondary | ICD-10-CM | POA: Insufficient documentation

## 2017-12-17 DIAGNOSIS — K219 Gastro-esophageal reflux disease without esophagitis: Secondary | ICD-10-CM | POA: Insufficient documentation

## 2017-12-17 DIAGNOSIS — Z882 Allergy status to sulfonamides status: Secondary | ICD-10-CM | POA: Insufficient documentation

## 2017-12-17 DIAGNOSIS — E039 Hypothyroidism, unspecified: Secondary | ICD-10-CM | POA: Insufficient documentation

## 2017-12-17 DIAGNOSIS — Z9049 Acquired absence of other specified parts of digestive tract: Secondary | ICD-10-CM | POA: Insufficient documentation

## 2017-12-17 DIAGNOSIS — I132 Hypertensive heart and chronic kidney disease with heart failure and with stage 5 chronic kidney disease, or end stage renal disease: Secondary | ICD-10-CM | POA: Diagnosis not present

## 2017-12-17 DIAGNOSIS — I509 Heart failure, unspecified: Secondary | ICD-10-CM | POA: Insufficient documentation

## 2017-12-17 DIAGNOSIS — M1990 Unspecified osteoarthritis, unspecified site: Secondary | ICD-10-CM | POA: Insufficient documentation

## 2017-12-17 DIAGNOSIS — I70229 Atherosclerosis of native arteries of extremities with rest pain, unspecified extremity: Secondary | ICD-10-CM

## 2017-12-17 DIAGNOSIS — I70221 Atherosclerosis of native arteries of extremities with rest pain, right leg: Secondary | ICD-10-CM | POA: Insufficient documentation

## 2017-12-17 DIAGNOSIS — E785 Hyperlipidemia, unspecified: Secondary | ICD-10-CM | POA: Diagnosis not present

## 2017-12-17 DIAGNOSIS — N186 End stage renal disease: Secondary | ICD-10-CM | POA: Diagnosis not present

## 2017-12-17 DIAGNOSIS — Z90710 Acquired absence of both cervix and uterus: Secondary | ICD-10-CM | POA: Insufficient documentation

## 2017-12-17 DIAGNOSIS — J449 Chronic obstructive pulmonary disease, unspecified: Secondary | ICD-10-CM | POA: Insufficient documentation

## 2017-12-17 DIAGNOSIS — Z9889 Other specified postprocedural states: Secondary | ICD-10-CM | POA: Insufficient documentation

## 2017-12-17 DIAGNOSIS — Z8249 Family history of ischemic heart disease and other diseases of the circulatory system: Secondary | ICD-10-CM | POA: Insufficient documentation

## 2017-12-17 DIAGNOSIS — G2581 Restless legs syndrome: Secondary | ICD-10-CM | POA: Insufficient documentation

## 2017-12-17 DIAGNOSIS — Z880 Allergy status to penicillin: Secondary | ICD-10-CM | POA: Insufficient documentation

## 2017-12-17 DIAGNOSIS — Z8673 Personal history of transient ischemic attack (TIA), and cerebral infarction without residual deficits: Secondary | ICD-10-CM | POA: Insufficient documentation

## 2017-12-17 DIAGNOSIS — Z888 Allergy status to other drugs, medicaments and biological substances status: Secondary | ICD-10-CM | POA: Insufficient documentation

## 2017-12-17 DIAGNOSIS — K589 Irritable bowel syndrome without diarrhea: Secondary | ICD-10-CM | POA: Insufficient documentation

## 2017-12-17 DIAGNOSIS — F17210 Nicotine dependence, cigarettes, uncomplicated: Secondary | ICD-10-CM | POA: Insufficient documentation

## 2017-12-17 HISTORY — PX: LOWER EXTREMITY ANGIOGRAPHY: CATH118251

## 2017-12-17 LAB — POTASSIUM (ARMC VASCULAR LAB ONLY): POTASSIUM (ARMC VASCULAR LAB): 4.4 (ref 3.5–5.1)

## 2017-12-17 SURGERY — LOWER EXTREMITY ANGIOGRAPHY
Anesthesia: Moderate Sedation | Laterality: Right

## 2017-12-17 MED ORDER — FENTANYL CITRATE (PF) 100 MCG/2ML IJ SOLN
INTRAMUSCULAR | Status: AC
Start: 2017-12-17 — End: ?
  Filled 2017-12-17: qty 2

## 2017-12-17 MED ORDER — MIDAZOLAM HCL 2 MG/2ML IJ SOLN
INTRAMUSCULAR | Status: DC | PRN
  Administered 2017-12-17: 2 mg via INTRAVENOUS
  Administered 2017-12-17 (×3): 1 mg via INTRAVENOUS

## 2017-12-17 MED ORDER — MIDAZOLAM HCL 5 MG/5ML IJ SOLN
INTRAMUSCULAR | Status: AC
  Filled 2017-12-17: qty 5

## 2017-12-17 MED ORDER — CLINDAMYCIN PHOSPHATE 300 MG/50ML IV SOLN
INTRAVENOUS | Status: AC
  Administered 2017-12-17: 300 mg via INTRAVENOUS
  Filled 2017-12-17: qty 50

## 2017-12-17 MED ORDER — HEPARIN SODIUM (PORCINE) 1000 UNIT/ML IJ SOLN
INTRAMUSCULAR | Status: DC | PRN
  Administered 2017-12-17: 5000 [IU] via INTRAVENOUS

## 2017-12-17 MED ORDER — IOPAMIDOL (ISOVUE-300) INJECTION 61%
INTRAVENOUS | Status: DC | PRN
  Administered 2017-12-17: 70 mL via INTRA_ARTERIAL

## 2017-12-17 MED ORDER — HEPARIN SODIUM (PORCINE) 1000 UNIT/ML IJ SOLN
INTRAMUSCULAR | Status: AC
  Filled 2017-12-17: qty 1

## 2017-12-17 MED ORDER — METHYLPREDNISOLONE SODIUM SUCC 125 MG IJ SOLR: 125.0000 mg | INTRAMUSCULAR | Status: DC | PRN

## 2017-12-17 MED ORDER — FENTANYL CITRATE (PF) 100 MCG/2ML IJ SOLN
INTRAMUSCULAR | Status: AC
  Filled 2017-12-17: qty 2

## 2017-12-17 MED ORDER — HYDROMORPHONE HCL 1 MG/ML IJ SOLN
INTRAMUSCULAR | Status: AC
Start: 2017-12-17 — End: ?
  Filled 2017-12-17: qty 1

## 2017-12-17 MED ORDER — LIDOCAINE-EPINEPHRINE (PF) 1 %-1:200000 IJ SOLN
INTRAMUSCULAR | Status: AC
  Filled 2017-12-17: qty 30

## 2017-12-17 MED ORDER — SODIUM CHLORIDE 0.9 % IV SOLN
INTRAVENOUS | Status: DC
  Administered 2017-12-17: 08:00:00 via INTRAVENOUS

## 2017-12-17 MED ORDER — HEPARIN (PORCINE) IN NACL 1000-0.9 UT/500ML-% IV SOLN
INTRAVENOUS | Status: AC
  Filled 2017-12-17: qty 1000

## 2017-12-17 MED ORDER — ONDANSETRON HCL 4 MG/2ML IJ SOLN: 4.0000 mg | Freq: Four times a day (QID) | INTRAMUSCULAR | Status: DC | PRN

## 2017-12-17 MED ORDER — FAMOTIDINE 20 MG PO TABS: 40.0000 mg | ORAL_TABLET | ORAL | Status: DC | PRN

## 2017-12-17 MED ORDER — CLOPIDOGREL BISULFATE 75 MG PO TABS: 75.0000 mg | ORAL_TABLET | Freq: Every day | ORAL | 11 refills | Status: DC

## 2017-12-17 MED ORDER — FENTANYL CITRATE (PF) 100 MCG/2ML IJ SOLN
INTRAMUSCULAR | Status: DC | PRN
  Administered 2017-12-17 (×4): 50 ug via INTRAVENOUS

## 2017-12-17 MED ORDER — HYDROMORPHONE HCL 1 MG/ML IJ SOLN
1.0000 mg | Freq: Once | INTRAMUSCULAR | Status: AC | PRN
  Administered 2017-12-17: 1 mg via INTRAVENOUS

## 2017-12-17 SURGICAL SUPPLY — 31 items
BALLN DORADO 5X200X135 (BALLOONS) ×3
BALLN LUTONIX 018 5X100X130 (BALLOONS) ×3
BALLN LUTONIX 018 5X220X130 (BALLOONS) ×3
BALLN LUTONIX DCB 6X60X130 (BALLOONS) ×3
BALLN LUTONIX DCB 6X80X130 (BALLOONS) ×3
BALLOON DORADO 5X200X135 (BALLOONS) ×1 IMPLANT
BALLOON LUTONIX 018 5X100X130 (BALLOONS) IMPLANT
BALLOON LUTONIX 018 5X220X130 (BALLOONS) IMPLANT
BALLOON LUTONIX DCB 6X60X130 (BALLOONS) IMPLANT
BALLOON LUTONIX DCB 6X80X130 (BALLOONS) ×1 IMPLANT
CATH BEACON 5 .035 65 RIM TIP (CATHETERS) ×2 IMPLANT
CATH BEACON 5 .038 100 VERT TP (CATHETERS) ×3 IMPLANT
CATH CXI SUPP ANG 4FR 135 (CATHETERS) ×1 IMPLANT
CATH CXI SUPP ANG 4FR 135CM (CATHETERS) ×3
CATH PIG 70CM (CATHETERS) ×2 IMPLANT
COVER PROBE U/S 5X48 (MISCELLANEOUS) ×3 IMPLANT
DEVICE PRESTO INFLATION (MISCELLANEOUS) ×2 IMPLANT
DEVICE STARCLOSE SE CLOSURE (Vascular Products) ×3 IMPLANT
GLIDEWIRE ADV .035X260CM (WIRE) ×2 IMPLANT
PACK ANGIOGRAPHY (CUSTOM PROCEDURE TRAY) ×3 IMPLANT
SHEATH ANL2 6FRX45 HC (SHEATH) ×2 IMPLANT
SHEATH BRITE TIP 5FRX11 (SHEATH) ×3 IMPLANT
SHEATH HIGHFLEX ANSEL 6FRX55 (SHEATH) ×3 IMPLANT
SHEATH RAABE 6FR (SHEATH) ×2 IMPLANT
STENT VIABAHN 6X100X120 (Permanent Stent) ×3 IMPLANT
STENT VIABAHN 6X250X120 (Permanent Stent) ×2 IMPLANT
SYR MEDRAD MARK V 150ML (SYRINGE) ×2 IMPLANT
TOWEL OR 17X26 4PK STRL BLUE (TOWEL DISPOSABLE) ×2 IMPLANT
TUBING CONTRAST HIGH PRESS 72 (TUBING) ×2 IMPLANT
WIRE G V18X300CM (WIRE) ×2 IMPLANT
WIRE J 3MM .035X145CM (WIRE) ×2 IMPLANT

## 2017-12-17 NOTE — Progress Notes (Signed)
Pt alert and oriented, reporting pain improved after PRN medication. Patient requesting "narcotic medication" prescription to go home with, MD notified with no new orders. Patient and MD state that she already has tramadol for leg pain. Discharge instructions reviewed with niece and patient, prescription for plavix given to pt with discharge instructions. Groin site WNL, pulses improved from pre-procedure. Pt states pain level is tolerable and is ready to go home at discharge.

## 2017-12-17 NOTE — Op Note (Signed)
[place] [facility]  Percutaneous Study/Intervention Procedural Note   Date of Surgery: 12/17/2017  Surgeon(s):DEW,JASON    Assistants:none  Pre-operative Diagnosis: PAD with rest pain RLE  Post-operative diagnosis:  Same  Procedure(s) Performed:             1.  Ultrasound guidance for vascular access left femoral artery             2.  Catheter placement into right popliteal artery from left femoral approach             3.  Aortogram and selective right lower extremity angiogram             4.  Percutaneous transluminal angioplasty of right common iliac artery stenosis with 6 mm diameter by 6 cm length Lutonix drug-coated angioplasty balloon             5.   Percutaneous transluminal angioplasty of the right external iliac artery with 6 mm diameter by 8 cm length Lutonix drug-coated angioplasty balloon  6.  Percutaneous transluminal angioplasty of the entire right SFA and above-knee popliteal artery with a 5 mm diameter by 22 cm length Lutonix drug-coated angioplasty balloon and a 5 mm diameter by 10 cm length Lutonix drug-coated angioplasty balloon  7.  Viabahn stent placement x2 to the right SFA and proximal popliteal artery with a 6 mm diameter by 10 cm length stent and a 6 mm diameter by 25 cm length stent             8.  StarClose closure device left femoral artery  EBL: 5 cc  Contrast: 70 cc  Fluoro Time: 11 minutes  Moderate Conscious Sedation Time: approximately 65 minutes using 5 mg of Versed and 200 Mcg of Fentanyl              Indications:  Patient is a 77 y.o.female with significant peripheral arterial disease bilaterally with rest pain in the right foot. The patient has noninvasive study showing a right ABI of 0.37 and a left ABI of 0.7. The patient is brought in for angiography for further evaluation and potential treatment.  Due to the limb threatening nature of the situation, angiogram was performed for attempted limb salvage. The patient is aware that  if the procedure fails, amputation would be expected.  The patient also understands that even with successful revascularization, amputation may still be required due to the severity of the situation.  Risks and benefits are discussed and informed consent is obtained.   Procedure:  The patient was identified and appropriate procedural time out was performed.  The patient was then placed supine on the table and prepped and draped in the usual sterile fashion. Moderate conscious sedation was administered during a face to face encounter with the patient throughout the procedure with my supervision of the RN administering medicines and monitoring the patient's vital signs, pulse oximetry, telemetry and mental status throughout from the start of the procedure until the patient was taken to the recovery room. Ultrasound was used to evaluate the left common femoral artery.  It was patent .  A digital ultrasound image was acquired.  A Seldinger needle was used to access the left common femoral artery under direct ultrasound guidance and a permanent image was performed.  A 0.035 J wire was advanced without resistance and a 5Fr sheath was placed.  Pigtail catheter was placed into the aorta and an AP aortogram was performed. This demonstrated renal arteries with sluggish flow, aorta highly  calcified but not stenotic, the left iliac system had a couple of areas approaching 50% stenosis but nothing that appeared high-grade on initial imaging.  The right common iliac artery had about a 60 to 70% stenosis 1 to 2 cm beyond its origin.  The right external iliac artery had about an 80% highly calcific stenosis that was separate and distinct from the common iliac lesion. I then crossed the aortic bifurcation and advanced to the right femoral head.  This was done with the rim catheter and the advantage wire as the pigtail catheter would not cross the bifurcation.  Selective right lower extremity angiogram was then performed. This  demonstrated a near flush occlusion of the right superficial femoral artery with reconstitution of the above-knee popliteal artery.  There is then surprisingly good runoff with an anterior tibial artery that was continuous to the foot and a peroneal and posterior tibial artery without focal stenoses as well.  The initial flow was somewhat sluggish due to the inflow disease to the peroneal and posterior tibial arteries were not seen going into the foot initially, but this was likely due to poor inflow. The patient was systemically heparinized and a 6 [place] Ansell sheath was then placed over the [employer] wire.  Originally this would not cross the right common iliac lesion.  I went ahead and treated the common iliac lesion with a 6 mm diameter by 6 cm length Lutonix drug-coated angioplasty balloon inflated to 10 atm for 1 minute.  Completion angiogram showed only about a 25 to 30% residual stenosis.  The sheath was then advanced but again would not cross beyond the right external iliac artery lesion.  A 6 mm diameter by 8 cm length Lutonix drug-coated angioplasty balloon was then used to treat the right external iliac artery.  This was inflated to 12 atm for 1 minute.  Completion angiogram showed about a 15% residual stenosis.  The sheath was then advanced to the right common femoral artery.  I then used a Kumpe catheter and the advantage wire to navigate down into the SFA and cross the occlusion exchanging for a CXI catheter to confirm intraluminal flow in the popliteal artery at the knee.  I then placed a 0.018 wire.  A 5 mm diameter by 22 cm length Lutonix drug-coated angioplasty balloon was then used to treat the proximal SFA from its origin down to the distal SFA.  This was inflated to 12 atm for 1 minute.  An additional 5 mm diameter by 10 cm length Lutonix drug-coated angioplasty balloon was used to treat the most distal SFA and the above-knee popliteal artery.  This was inflated to 10 atm for 1 minute.   The majority of the SFA had residual greater than 50% stenosis and irregularity in the reentry point in the above-knee popliteal artery also had a relatively high-grade residual stenosis.  I elected to place stents.  A 6 mm diameter by 25 cm length Viabahn stent was then deployed from the above-knee popliteal artery up to the proximal to mid SFA.  A 6 mm diameter by 10 cm length Viabahn stent was then deployed from the origin of the SFA down to the initial stent.  These were then postdilated with 5 mm diameter high-pressure angioplasty balloons with less than 10% residual stenosis seen and good runoff distally now to the foot. I elected to terminate the procedure. The sheath was removed and StarClose closure device was deployed in the left femoral artery with excellent hemostatic result. The  patient was taken to the recovery room in stable condition having tolerated the procedure well.  Findings:               Aortogram:  renal arteries with sluggish flow, aorta highly calcified but not stenotic, the left iliac system had a couple of areas approaching 50% stenosis but nothing that appeared high-grade on initial imaging.  The right common iliac artery had about a 60 to 70% stenosis 1 to 2 cm beyond its origin.  The right external iliac artery had about an 80% highly calcific stenosis that was separate and distinct from the common iliac lesion.             Right lower Extremity:  a near flush occlusion of the right superficial femoral artery with reconstitution of the above-knee popliteal artery.  There is then surprisingly good runoff with an anterior tibial artery that was continuous to the foot and a peroneal and posterior tibial artery without focal stenoses as well.  The initial flow was somewhat sluggish due to the inflow disease to the peroneal and posterior tibial arteries were not seen going into the foot initially, but this was likely due to poor inflow.   Disposition: Patient was taken to the  recovery room in stable condition having tolerated the procedure well.  Complications: None  Leotis Pain 12/17/2017 9:43 AM   This note was created with Dragon Medical transcription system. Any errors in dictation are purely unintentional.

## 2017-12-17 NOTE — H&P (Signed)
[facility] History & Physical Update  The patient was interviewed and re-examined.  The patient's previous History and Physical has been reviewed and is unchanged.  There is no change in the plan of care. We plan to proceed with the scheduled procedure.  Leotis Pain, MD  12/17/2017, 8:07 AM

## 2017-12-18 ENCOUNTER — Telehealth (INDEPENDENT_AMBULATORY_CARE_PROVIDER_SITE_OTHER): Payer: Self-pay | Admitting: Vascular Surgery

## 2017-12-18 NOTE — Telephone Encounter (Signed)
I spoke with the patient daughter and inform her Dew medical advise

## 2017-12-18 NOTE — Telephone Encounter (Signed)
Pt daugther said her mon in extreme pain from her surgery and that pt feels she cant do dialysis at center but want to get admitted @hospital  and get dialysis there. Pain med not helping. Darlene be reached @ [phone].

## 2017-12-21 ENCOUNTER — Inpatient Hospital Stay (HOSPITAL_COMMUNITY)
Admission: EM | Admit: 2017-12-21 | Discharge: 2017-12-27 | DRG: 299 | Disposition: A | Discharge status: Home Health | Payer: Medicare Other | Attending: Internal Medicine | Admitting: Internal Medicine

## 2017-12-21 ENCOUNTER — Other Ambulatory Visit: Payer: Self-pay

## 2017-12-21 ENCOUNTER — Ambulatory Visit (HOSPITAL_COMMUNITY): Payer: Medicare Other

## 2017-12-21 ENCOUNTER — Emergency Department (HOSPITAL_COMMUNITY): Payer: Medicare Other

## 2017-12-21 ENCOUNTER — Encounter (HOSPITAL_COMMUNITY): Payer: Self-pay

## 2017-12-21 DIAGNOSIS — D638 Anemia in other chronic diseases classified elsewhere: Secondary | ICD-10-CM | POA: Diagnosis present

## 2017-12-21 DIAGNOSIS — I447 Left bundle-branch block, unspecified: Secondary | ICD-10-CM | POA: Diagnosis present

## 2017-12-21 DIAGNOSIS — D72829 Elevated white blood cell count, unspecified: Secondary | ICD-10-CM | POA: Insufficient documentation

## 2017-12-21 DIAGNOSIS — I132 Hypertensive heart and chronic kidney disease with heart failure and with stage 5 chronic kidney disease, or end stage renal disease: Secondary | ICD-10-CM | POA: Diagnosis not present

## 2017-12-21 DIAGNOSIS — R079 Chest pain, unspecified: Secondary | ICD-10-CM | POA: Diagnosis not present

## 2017-12-21 DIAGNOSIS — E7800 Pure hypercholesterolemia, unspecified: Secondary | ICD-10-CM | POA: Diagnosis present

## 2017-12-21 DIAGNOSIS — I82401 Acute embolism and thrombosis of unspecified deep veins of right lower extremity: Secondary | ICD-10-CM | POA: Diagnosis present

## 2017-12-21 DIAGNOSIS — F419 Anxiety disorder, unspecified: Secondary | ICD-10-CM

## 2017-12-21 DIAGNOSIS — F4310 Post-traumatic stress disorder, unspecified: Secondary | ICD-10-CM | POA: Diagnosis present

## 2017-12-21 DIAGNOSIS — Z992 Dependence on renal dialysis: Secondary | ICD-10-CM

## 2017-12-21 DIAGNOSIS — I509 Heart failure, unspecified: Secondary | ICD-10-CM

## 2017-12-21 DIAGNOSIS — J449 Chronic obstructive pulmonary disease, unspecified: Secondary | ICD-10-CM | POA: Diagnosis present

## 2017-12-21 DIAGNOSIS — I120 Hypertensive chronic kidney disease with stage 5 chronic kidney disease or end stage renal disease: Secondary | ICD-10-CM | POA: Diagnosis not present

## 2017-12-21 DIAGNOSIS — K210 Gastro-esophageal reflux disease with esophagitis: Secondary | ICD-10-CM

## 2017-12-21 DIAGNOSIS — M109 Gout, unspecified: Secondary | ICD-10-CM | POA: Diagnosis present

## 2017-12-21 DIAGNOSIS — M79604 Pain in right leg: Secondary | ICD-10-CM | POA: Diagnosis not present

## 2017-12-21 DIAGNOSIS — Z8673 Personal history of transient ischemic attack (TIA), and cerebral infarction without residual deficits: Secondary | ICD-10-CM

## 2017-12-21 DIAGNOSIS — H532 Diplopia: Secondary | ICD-10-CM | POA: Diagnosis present

## 2017-12-21 DIAGNOSIS — R52 Pain, unspecified: Secondary | ICD-10-CM | POA: Diagnosis not present

## 2017-12-21 DIAGNOSIS — Z880 Allergy status to penicillin: Secondary | ICD-10-CM

## 2017-12-21 DIAGNOSIS — I739 Peripheral vascular disease, unspecified: Secondary | ICD-10-CM | POA: Diagnosis not present

## 2017-12-21 DIAGNOSIS — F324 Major depressive disorder, single episode, in partial remission: Secondary | ICD-10-CM | POA: Diagnosis present

## 2017-12-21 DIAGNOSIS — F17210 Nicotine dependence, cigarettes, uncomplicated: Secondary | ICD-10-CM | POA: Diagnosis present

## 2017-12-21 DIAGNOSIS — Z79899 Other long term (current) drug therapy: Secondary | ICD-10-CM

## 2017-12-21 DIAGNOSIS — D6489 Other specified anemias: Secondary | ICD-10-CM

## 2017-12-21 DIAGNOSIS — E875 Hyperkalemia: Secondary | ICD-10-CM | POA: Diagnosis not present

## 2017-12-21 DIAGNOSIS — Z8249 Family history of ischemic heart disease and other diseases of the circulatory system: Secondary | ICD-10-CM

## 2017-12-21 DIAGNOSIS — N186 End stage renal disease: Secondary | ICD-10-CM | POA: Diagnosis present

## 2017-12-21 DIAGNOSIS — Z841 Family history of disorders of kidney and ureter: Secondary | ICD-10-CM

## 2017-12-21 DIAGNOSIS — I10 Essential (primary) hypertension: Secondary | ICD-10-CM

## 2017-12-21 DIAGNOSIS — I5021 Acute systolic (congestive) heart failure: Secondary | ICD-10-CM

## 2017-12-21 DIAGNOSIS — Z7902 Long term (current) use of antithrombotics/antiplatelets: Secondary | ICD-10-CM

## 2017-12-21 DIAGNOSIS — G9341 Metabolic encephalopathy: Secondary | ICD-10-CM | POA: Diagnosis present

## 2017-12-21 DIAGNOSIS — Z7951 Long term (current) use of inhaled steroids: Secondary | ICD-10-CM

## 2017-12-21 DIAGNOSIS — E785 Hyperlipidemia, unspecified: Secondary | ICD-10-CM | POA: Diagnosis present

## 2017-12-21 DIAGNOSIS — G609 Hereditary and idiopathic neuropathy, unspecified: Secondary | ICD-10-CM | POA: Diagnosis present

## 2017-12-21 DIAGNOSIS — D631 Anemia in chronic kidney disease: Secondary | ICD-10-CM | POA: Diagnosis present

## 2017-12-21 DIAGNOSIS — N2581 Secondary hyperparathyroidism of renal origin: Secondary | ICD-10-CM | POA: Diagnosis present

## 2017-12-21 DIAGNOSIS — I5032 Chronic diastolic (congestive) heart failure: Secondary | ICD-10-CM | POA: Diagnosis present

## 2017-12-21 DIAGNOSIS — Z90710 Acquired absence of both cervix and uterus: Secondary | ICD-10-CM

## 2017-12-21 DIAGNOSIS — Z885 Allergy status to narcotic agent status: Secondary | ICD-10-CM

## 2017-12-21 DIAGNOSIS — K219 Gastro-esophageal reflux disease without esophagitis: Secondary | ICD-10-CM | POA: Diagnosis present

## 2017-12-21 DIAGNOSIS — Z882 Allergy status to sulfonamides status: Secondary | ICD-10-CM

## 2017-12-21 DIAGNOSIS — B009 Herpesviral infection, unspecified: Secondary | ICD-10-CM | POA: Diagnosis present

## 2017-12-21 DIAGNOSIS — F17200 Nicotine dependence, unspecified, uncomplicated: Secondary | ICD-10-CM | POA: Diagnosis present

## 2017-12-21 DIAGNOSIS — J80 Acute respiratory distress syndrome: Secondary | ICD-10-CM | POA: Diagnosis not present

## 2017-12-21 DIAGNOSIS — F329 Major depressive disorder, single episode, unspecified: Secondary | ICD-10-CM | POA: Diagnosis present

## 2017-12-21 DIAGNOSIS — Z7982 Long term (current) use of aspirin: Secondary | ICD-10-CM

## 2017-12-21 DIAGNOSIS — K581 Irritable bowel syndrome with constipation: Secondary | ICD-10-CM | POA: Diagnosis present

## 2017-12-21 DIAGNOSIS — I959 Hypotension, unspecified: Secondary | ICD-10-CM | POA: Diagnosis not present

## 2017-12-21 DIAGNOSIS — Z888 Allergy status to other drugs, medicaments and biological substances status: Secondary | ICD-10-CM

## 2017-12-21 DIAGNOSIS — G2581 Restless legs syndrome: Secondary | ICD-10-CM | POA: Diagnosis present

## 2017-12-21 DIAGNOSIS — G40909 Epilepsy, unspecified, not intractable, without status epilepticus: Secondary | ICD-10-CM | POA: Diagnosis present

## 2017-12-21 HISTORY — DX: Peripheral vascular disease, unspecified: I73.9

## 2017-12-21 LAB — RENAL FUNCTION PANEL
Albumin: 2.5 g/dL — ABNORMAL LOW (ref 3.5–5.0)
Anion gap: 17 — ABNORMAL HIGH (ref 5–15)
BUN: 61 mg/dL — AB (ref 8–23)
CO2: 24 mmol/L (ref 22–32)
Calcium: 8.2 mg/dL — ABNORMAL LOW (ref 8.9–10.3)
Chloride: 95 mmol/L — ABNORMAL LOW (ref 98–111)
Creatinine, Ser: 13.27 mg/dL — ABNORMAL HIGH (ref 0.44–1.00)
GFR calc Af Amer: 3 mL/min — ABNORMAL LOW (ref >60)
GFR calc non Af Amer: 2 mL/min — ABNORMAL LOW (ref >60)
GLUCOSE: 37 mg/dL — AB (ref 70–99)
POTASSIUM: 5.8 mmol/L — AB (ref 3.5–5.1)
Phosphorus: 7.7 mg/dL — ABNORMAL HIGH (ref 2.5–4.6)
Sodium: 136 mmol/L (ref 135–145)

## 2017-12-21 LAB — I-STAT CHEM 8, ED
BUN: 58 mg/dL — AB (ref 8–23)
CHLORIDE: 100 mmol/L (ref 98–111)
Calcium, Ion: 0.84 mmol/L — CL (ref 1.15–1.40)
Creatinine, Ser: 14.5 mg/dL — ABNORMAL HIGH (ref 0.44–1.00)
Glucose, Bld: 69 mg/dL — ABNORMAL LOW (ref 70–99)
HEMATOCRIT: 33 % — AB (ref 36.0–46.0)
Hemoglobin: 11.2 g/dL — ABNORMAL LOW (ref 12.0–15.0)
Potassium: 7 mmol/L (ref 3.5–5.1)
Sodium: 130 mmol/L — ABNORMAL LOW (ref 135–145)
TCO2: 26 mmol/L (ref 22–32)

## 2017-12-21 LAB — POTASSIUM: POTASSIUM: 7.4 mmol/L — AB (ref 3.5–5.1)

## 2017-12-21 LAB — CBC WITH DIFFERENTIAL/PLATELET
Abs Immature Granulocytes: 0.1 10*3/uL (ref 0.0–0.1)
Basophils Absolute: 0 10*3/uL (ref 0.0–0.1)
Basophils Relative: 0 %
EOS PCT: 5 %
Eosinophils Absolute: 0.7 10*3/uL (ref 0.0–0.7)
HEMATOCRIT: 33.7 % — AB (ref 36.0–46.0)
HEMOGLOBIN: 10.8 g/dL — AB (ref 12.0–15.0)
IMMATURE GRANULOCYTES: 1 %
LYMPHS ABS: 1 10*3/uL (ref 0.7–4.0)
LYMPHS PCT: 8 %
MCH: 29 pg (ref 26.0–34.0)
MCHC: 32 g/dL (ref 30.0–36.0)
MCV: 90.3 fL (ref 78.0–100.0)
MONOS PCT: 11 %
Monocytes Absolute: 1.4 10*3/uL — ABNORMAL HIGH (ref 0.1–1.0)
NEUTROS PCT: 75 %
Neutro Abs: 9.3 10*3/uL — ABNORMAL HIGH (ref 1.7–7.7)
Platelets: 215 10*3/uL (ref 150–400)
RBC: 3.73 MIL/uL — AB (ref 3.87–5.11)
RDW: 16.5 % — ABNORMAL HIGH (ref 11.5–15.5)
WBC: 12.4 10*3/uL — AB (ref 4.0–10.5)

## 2017-12-21 LAB — CK: Total CK: 46 U/L (ref 38–234)

## 2017-12-21 LAB — HEPATIC FUNCTION PANEL
ALT: 10 U/L (ref 0–44)
AST: 19 U/L (ref 15–41)
Albumin: 2.6 g/dL — ABNORMAL LOW (ref 3.5–5.0)
Alkaline Phosphatase: 82 U/L (ref 38–126)
BILIRUBIN TOTAL: 0.6 mg/dL (ref 0.3–1.2)
Bilirubin, Direct: 0.1 mg/dL (ref 0.0–0.2)
Indirect Bilirubin: 0.5 mg/dL (ref 0.3–0.9)
Total Protein: 7.2 g/dL (ref 6.5–8.1)

## 2017-12-21 LAB — CBG MONITORING, ED
Glucose-Capillary: 57 mg/dL — ABNORMAL LOW (ref 70–99)
Glucose-Capillary: 117 mg/dL — ABNORMAL HIGH (ref 70–99)
Glucose-Capillary: 119 mg/dL — ABNORMAL HIGH (ref 70–99)
Glucose-Capillary: 80 mg/dL (ref 70–99)

## 2017-12-21 LAB — GLUCOSE, CAPILLARY
Glucose-Capillary: 70 mg/dL (ref 70–99)
Glucose-Capillary: 107 mg/dL — ABNORMAL HIGH (ref 70–99)

## 2017-12-21 LAB — MRSA PCR SCREENING: MRSA by PCR: NEGATIVE

## 2017-12-21 LAB — CBC
HCT: 32.6 % — ABNORMAL LOW (ref 36.0–46.0)
Hemoglobin: 10.5 g/dL — ABNORMAL LOW (ref 12.0–15.0)
MCH: 28.8 pg (ref 26.0–34.0)
MCHC: 32.2 g/dL (ref 30.0–36.0)
MCV: 89.3 fL (ref 78.0–100.0)
PLATELETS: 257 10*3/uL (ref 150–400)
RBC: 3.65 MIL/uL — ABNORMAL LOW (ref 3.87–5.11)
RDW: 16.4 % — AB (ref 11.5–15.5)
WBC: 14.1 10*3/uL — ABNORMAL HIGH (ref 4.0–10.5)

## 2017-12-21 LAB — I-STAT TROPONIN, ED: TROPONIN I, POC: 0.03 ng/mL (ref 0.00–0.08)

## 2017-12-21 LAB — PROCALCITONIN: Procalcitonin: 0.8 ng/mL

## 2017-12-21 MED ORDER — LORAZEPAM 1 MG PO TABS
1.0000 mg | ORAL_TABLET | Freq: Once | ORAL | Status: AC | PRN
  Administered 2017-12-22: 1 mg via ORAL
  Filled 2017-12-21: qty 1

## 2017-12-21 MED ORDER — SODIUM CHLORIDE 0.9 % IV SOLN: 100.0000 mL | INTRAVENOUS | Status: DC | PRN

## 2017-12-21 MED ORDER — CLOPIDOGREL BISULFATE 75 MG PO TABS
75.0000 mg | ORAL_TABLET | Freq: Every day | ORAL | Status: DC
  Administered 2017-12-22 – 2017-12-27 (×7): 75 mg via ORAL
  Filled 2017-12-21 (×7): qty 1

## 2017-12-21 MED ORDER — LOSARTAN POTASSIUM 50 MG PO TABS: 100.0000 mg | ORAL_TABLET | Freq: Every day | ORAL | Status: DC

## 2017-12-21 MED ORDER — ACETAMINOPHEN 325 MG PO TABS
650.0000 mg | ORAL_TABLET | Freq: Four times a day (QID) | ORAL | Status: DC | PRN
  Administered 2017-12-22 – 2017-12-25 (×3): 650 mg via ORAL
  Filled 2017-12-21 (×3): qty 2

## 2017-12-21 MED ORDER — GLYCOPYRROLATE 1 MG PO TABS
2.0000 mg | ORAL_TABLET | Freq: Two times a day (BID) | ORAL | Status: DC
  Administered 2017-12-22 – 2017-12-27 (×11): 2 mg via ORAL
  Filled 2017-12-21 (×14): qty 2

## 2017-12-21 MED ORDER — SODIUM CHLORIDE 0.9 % IV SOLN
1.0000 g | Freq: Once | INTRAVENOUS | Status: AC
  Administered 2017-12-21: 1 g via INTRAVENOUS
  Filled 2017-12-21: qty 10

## 2017-12-21 MED ORDER — SEVELAMER CARBONATE 800 MG PO TABS
2400.0000 mg | ORAL_TABLET | Freq: Three times a day (TID) | ORAL | Status: DC
  Administered 2017-12-22 – 2017-12-27 (×10): 2400 mg via ORAL
  Filled 2017-12-21 (×12): qty 3

## 2017-12-21 MED ORDER — SENNOSIDES-DOCUSATE SODIUM 8.6-50 MG PO TABS: 1.0000 | ORAL_TABLET | Freq: Every evening | ORAL | Status: DC | PRN

## 2017-12-21 MED ORDER — DOXERCALCIFEROL 4 MCG/2ML IV SOLN
INTRAVENOUS | Status: AC
  Administered 2017-12-21: 1 ug
  Filled 2017-12-21: qty 2

## 2017-12-21 MED ORDER — ASPIRIN EC 81 MG PO TBEC
81.0000 mg | DELAYED_RELEASE_TABLET | Freq: Every day | ORAL | Status: DC
  Administered 2017-12-22 – 2017-12-23 (×3): 81 mg via ORAL
  Filled 2017-12-21 (×3): qty 1

## 2017-12-21 MED ORDER — HEPARIN SODIUM (PORCINE) 1000 UNIT/ML DIALYSIS: 1000.0000 [IU] | INTRAMUSCULAR | Status: DC | PRN

## 2017-12-21 MED ORDER — FENTANYL CITRATE (PF) 100 MCG/2ML IJ SOLN
50.0000 ug | Freq: Once | INTRAMUSCULAR | Status: AC
Start: 2017-12-21 — End: 2017-12-21
  Administered 2017-12-21: 50 ug via INTRAMUSCULAR

## 2017-12-21 MED ORDER — AZELASTINE HCL 0.1 % NA SOLN
1.0000 | Freq: Two times a day (BID) | NASAL | Status: DC | PRN
  Filled 2017-12-21: qty 30

## 2017-12-21 MED ORDER — ALLOPURINOL 100 MG PO TABS
100.0000 mg | ORAL_TABLET | ORAL | Status: DC
  Administered 2017-12-22 – 2017-12-27 (×4): 100 mg via ORAL
  Filled 2017-12-21 (×5): qty 1

## 2017-12-21 MED ORDER — DEXTROSE 50 % IV SOLN
1.0000 | Freq: Once | INTRAVENOUS | Status: AC
  Administered 2017-12-21: 50 mL via INTRAVENOUS
  Filled 2017-12-21: qty 50

## 2017-12-21 MED ORDER — DOXERCALCIFEROL 4 MCG/2ML IV SOLN
1.0000 ug | INTRAVENOUS | Status: DC
  Administered 2017-12-23 – 2017-12-25 (×2): 1 ug via INTRAVENOUS
  Filled 2017-12-21 (×2): qty 2

## 2017-12-21 MED ORDER — HEPARIN SODIUM (PORCINE) 1000 UNIT/ML DIALYSIS: 4000.0000 [IU] | INTRAMUSCULAR | Status: DC | PRN

## 2017-12-21 MED ORDER — INSULIN ASPART 100 UNIT/ML ~~LOC~~ SOLN
10.0000 [IU] | Freq: Once | SUBCUTANEOUS | Status: AC
  Administered 2017-12-21: 10 [IU] via INTRAVENOUS
  Filled 2017-12-21: qty 1

## 2017-12-21 MED ORDER — AMLODIPINE BESYLATE 10 MG PO TABS
10.0000 mg | ORAL_TABLET | Freq: Every day | ORAL | Status: DC
  Administered 2017-12-22 – 2017-12-26 (×6): 10 mg via ORAL
  Filled 2017-12-21 (×6): qty 1

## 2017-12-21 MED ORDER — ONDANSETRON HCL 4 MG PO TABS: 4.0000 mg | ORAL_TABLET | Freq: Four times a day (QID) | ORAL | Status: DC | PRN

## 2017-12-21 MED ORDER — HEPARIN SODIUM (PORCINE) 5000 UNIT/ML IJ SOLN
5000.0000 [IU] | Freq: Three times a day (TID) | INTRAMUSCULAR | Status: DC
  Administered 2017-12-22 – 2017-12-23 (×3): 5000 [IU] via SUBCUTANEOUS
  Filled 2017-12-21 (×4): qty 1

## 2017-12-21 MED ORDER — FENTANYL CITRATE (PF) 100 MCG/2ML IJ SOLN
25.0000 ug | Freq: Once | INTRAMUSCULAR | Status: DC
  Filled 2017-12-21: qty 2

## 2017-12-21 MED ORDER — CINACALCET HCL 30 MG PO TABS
90.0000 mg | ORAL_TABLET | Freq: Every day | ORAL | Status: DC
  Administered 2017-12-22 – 2017-12-26 (×6): 90 mg via ORAL
  Filled 2017-12-21 (×7): qty 3

## 2017-12-21 MED ORDER — METOPROLOL SUCCINATE ER 100 MG PO TB24
100.0000 mg | ORAL_TABLET | Freq: Two times a day (BID) | ORAL | Status: DC
  Administered 2017-12-22 – 2017-12-27 (×10): 100 mg via ORAL
  Filled 2017-12-21 (×12): qty 1

## 2017-12-21 MED ORDER — NEPRO/CARBSTEADY PO LIQD
237.0000 mL | Freq: Two times a day (BID) | ORAL | Status: DC
  Administered 2017-12-23 – 2017-12-27 (×4): 237 mL via ORAL
  Filled 2017-12-21 (×12): qty 237

## 2017-12-21 MED ORDER — LIDOCAINE-PRILOCAINE 2.5-2.5 % EX CREA: 1.0000 "application " | TOPICAL_CREAM | CUTANEOUS | Status: DC | PRN

## 2017-12-21 MED ORDER — BISACODYL 10 MG RE SUPP: 10.0000 mg | Freq: Every day | RECTAL | Status: DC | PRN

## 2017-12-21 MED ORDER — ATORVASTATIN CALCIUM 40 MG PO TABS
40.0000 mg | ORAL_TABLET | Freq: Every day | ORAL | Status: DC
  Administered 2017-12-22 – 2017-12-26 (×6): 40 mg via ORAL
  Filled 2017-12-21 (×6): qty 1

## 2017-12-21 MED ORDER — PENTAFLUOROPROP-TETRAFLUOROETH EX AERO: 1.0000 "application " | INHALATION_SPRAY | CUTANEOUS | Status: DC | PRN

## 2017-12-21 MED ORDER — HYDROXYZINE HCL 25 MG PO TABS: 25.0000 mg | ORAL_TABLET | Freq: Three times a day (TID) | ORAL | Status: DC | PRN

## 2017-12-21 MED ORDER — ALBUTEROL SULFATE (2.5 MG/3ML) 0.083% IN NEBU
5.0000 mg | INHALATION_SOLUTION | Freq: Once | RESPIRATORY_TRACT | Status: AC
  Administered 2017-12-21: 5 mg via RESPIRATORY_TRACT
  Filled 2017-12-21: qty 6

## 2017-12-21 MED ORDER — PREGABALIN 100 MG PO CAPS
100.0000 mg | ORAL_CAPSULE | Freq: Every day | ORAL | Status: DC
  Administered 2017-12-22: 100 mg via ORAL
  Filled 2017-12-21: qty 1

## 2017-12-21 MED ORDER — PANTOPRAZOLE SODIUM 20 MG PO TBEC
20.0000 mg | DELAYED_RELEASE_TABLET | Freq: Two times a day (BID) | ORAL | Status: DC
  Administered 2017-12-21 – 2017-12-27 (×11): 20 mg via ORAL
  Filled 2017-12-21 (×12): qty 1

## 2017-12-21 MED ORDER — LIDOCAINE HCL (PF) 1 % IJ SOLN: 5.0000 mL | INTRAMUSCULAR | Status: DC | PRN

## 2017-12-21 MED ORDER — ONDANSETRON HCL 4 MG/2ML IJ SOLN
4.0000 mg | Freq: Once | INTRAMUSCULAR | Status: AC
  Administered 2017-12-21: 4 mg via INTRAVENOUS
  Filled 2017-12-21: qty 2

## 2017-12-21 MED ORDER — ONDANSETRON HCL 4 MG/2ML IJ SOLN: 4.0000 mg | Freq: Four times a day (QID) | INTRAMUSCULAR | Status: DC | PRN

## 2017-12-21 MED ORDER — CLONIDINE HCL 0.2 MG PO TABS
0.2000 mg | ORAL_TABLET | Freq: Every day | ORAL | Status: DC
  Administered 2017-12-22 – 2017-12-24 (×2): 0.2 mg via ORAL
  Filled 2017-12-21 (×3): qty 1

## 2017-12-21 MED ORDER — LIDOCAINE-PRILOCAINE 2.5-2.5 % EX CREA
1.0000 "application " | TOPICAL_CREAM | CUTANEOUS | Status: DC
  Administered 2017-12-25: 1 via TOPICAL
  Filled 2017-12-21: qty 5

## 2017-12-21 MED ORDER — ACETAMINOPHEN 650 MG RE SUPP: 650.0000 mg | Freq: Four times a day (QID) | RECTAL | Status: DC | PRN

## 2017-12-21 MED ORDER — ALTEPLASE 2 MG IJ SOLR: 2.0000 mg | Freq: Once | INTRAMUSCULAR | Status: DC | PRN

## 2017-12-21 MED ORDER — DULOXETINE HCL 60 MG PO CPEP
60.0000 mg | ORAL_CAPSULE | Freq: Every day | ORAL | Status: DC
  Administered 2017-12-22 – 2017-12-26 (×6): 60 mg via ORAL
  Filled 2017-12-21 (×6): qty 1

## 2017-12-21 MED ORDER — RENA-VITE PO TABS
1.0000 | ORAL_TABLET | Freq: Every day | ORAL | Status: DC
  Administered 2017-12-22 – 2017-12-25 (×5): 1 via ORAL
  Filled 2017-12-21 (×6): qty 1

## 2017-12-21 MED ORDER — CHLORHEXIDINE GLUCONATE CLOTH 2 % EX PADS: 6.0000 | MEDICATED_PAD | Freq: Every day | CUTANEOUS | Status: DC

## 2017-12-21 NOTE — Consult Note (Signed)
[facility] [place]  Patient name: [name] MRN: [id number] DOB: 14-Mar-1941 Sex: female    HPI: [name] is a 77 y.o. female seen in the emergency department for concern regarding lower extremity discomfort.  She had undergone right superficial femoral artery stenting by Dr. Lucky Cowboy on 12/17/2017.  Reported having persistent pain which is progressed and presented to the emergency room today for evaluation.  She had called Dr. Lucky Cowboy who had felt that outpatient follow-up was appropriate and that pain medication would take care of this.  Of note she has not hemodialyzed for nearly a week and is hyperkalemic today as well.  She reports that she has total leg pain in the right and less so on the left.  She did have left groin access for the right intervention.  Did not have any intervention in her left leg.  She reports that the pain is neuropathic type pain which she normally has but it is worse and goes from her groin all the way down to her foot.  Her skin is very hypersensitive.  I do not have specific details regarding her treatment  Past Medical History:  Diagnosis Date  . Abnormality of gait 03/28/2015  . Adrenal mass ([place])   . 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 Arthritis   . Back pain   . CHF (congestive heart failure) ([place])   . Chronic kidney disease    Hemo MWF  . Congestion of throat    Pt states she has a lot mucus in back throat.  . Constipation   . COPD (chronic obstructive pulmonary disease) ([place])   . Depression   . Dialysis patient [facility])    kidney  . Diverticulitis   . GERD (gastroesophageal reflux disease)   . H/O hiatal hernia   . High cholesterol   . Hoarseness of voice   . Hyperlipidemia   . Hypertension   . IBS (irritable bowel syndrome)   . Meralgia paresthetica of right side 12/26/2014  . Normal cardiac stress test 12/24/2009   lexiscan, imaging normal  .  Pneumonia    [id number]  . Renal disorder   . RLS (restless legs syndrome)   . Seizures ([place])    2004 past brain surgery  . Sinus complaint   . Stroke ([place])    TIAs per patient 2 or 3  . Thyroid disease   . TIA (transient ischemic attack)   . Tubular adenoma of colon 01/2008    Family History  Problem Relation Age of Onset  . Thyroid cancer Mother   . Heart disease Father   . Hypertension Father   . Heart attack Father   . Lupus Daughter   . Breast cancer Sister   . Thyroid cancer Sister   . Kidney disease Sister   . Colon cancer Neg Hx     SOCIAL HISTORY: Social History   Tobacco Use  . Smoking status: Current Every Day Smoker    Packs/day: 1.00    Years: 60.00    Pack years: 60.00    Types: Cigarettes  . Smokeless tobacco: Never Used  . Tobacco comment: smoker since 77 yo.  1.5 ppd of [place] 100 lights. ; form given 10-23-16  Substance Use Topics  . Alcohol use: No    Alcohol/week: 0.0 oz    Allergies  Allergen Reactions  . Tuberculin Tests Hives    "blisters"  . Valacyclovir  Other (See Comments)    Confusion and nervousness  . Codeine Nausea And Vomiting  . Penicillins Rash    No problems breathing. Has tolerated omnicef in past without issue Has patient had a PCN reaction causing immediate rash, facial/tongue/throat swelling, SOB or lightheadedness with hypotension: Yes Has patient had a PCN reaction causing severe rash involving mucus membranes or skin necrosis: No Has patient had a PCN reaction that required hospitalization No Has patient had a PCN reaction occurring within the last 10 years: No If all of the above answers are "NO", then may proceed with Cephalosporin  . Sulfamethoxazole Rash    Current Facility-Administered Medications  Medication Dose Route Frequency Provider Last Rate Last Dose  . ondansetron (ZOFRAN) injection 4 mg  4 mg Intravenous Once Blanchie Dessert, MD       Current Outpatient Medications  Medication Sig Dispense Refill  .  acetaminophen (TYLENOL) 500 MG tablet Take 500 mg by mouth every 6 (six) hours as needed for moderate pain.     Marland Kitchen albuterol (PROVENTIL HFA;VENTOLIN HFA) 108 (90 Base) MCG/ACT inhaler Inhale 2 puffs into the lungs every 4 (four) hours as needed for wheezing or shortness of breath. 1 Inhaler 1  . allopurinol (ZYLOPRIM) 100 MG tablet Take 100 mg by mouth once daily on non-dialysis days (Tues/Thurs/Sat/Sun) (Patient taking differently: Take 100 mg by mouth See admin instructions. Take one tablet (100 mg) by mouth once daily on non-dialysis days (Tues/Thurs/Sat/Sun)) 30 tablet 0  . amLODipine (NORVASC) 10 MG tablet TAKE 1 TABLET BY MOUTH AT BEDTIME (Patient taking differently: TAKE 1 TABLET (10 MG) BY MOUTH AT BEDTIME) 90 tablet 1  . aspirin EC 81 MG tablet Take 81 mg by mouth daily.     Marland Kitchen atorvastatin (LIPITOR) 40 MG tablet Take 1 tablet (40 mg total) by mouth daily. (Patient taking differently: Take 40 mg by mouth at bedtime. ) 90 tablet 3  . azelastine (ASTELIN) 0.1 % nasal spray Place 1 spray into both nostrils 2 (two) times daily. Use in each nostril as directed (Patient taking differently: Place 1 spray into both nostrils 2 (two) times daily as needed. Use in each nostril as directed) 30 mL 6  . B Complex-C-Zn-Folic Acid (DIALYVITE/ZINC) TABS Take 1 tablet by mouth daily.  6  . budesonide-formoterol (SYMBICORT) 160-4.5 MCG/ACT inhaler Inhale 2 puffs into the lungs 2 (two) times daily. 1 Inhaler 12  . cinacalcet (SENSIPAR) 90 MG tablet Take 90 mg by mouth at bedtime.    . cloNIDine (CATAPRES) 0.2 MG tablet Take 0.2 mg by mouth daily.   5  . clopidogrel (PLAVIX) 75 MG tablet Take 1 tablet (75 mg total) by mouth daily. 30 tablet 11  . DULoxetine (CYMBALTA) 30 MG capsule Take 2 capsules (60 mg total) by mouth daily. (Patient taking differently: Take 60 mg by mouth at bedtime. ) 180 capsule 3  . fluticasone (FLONASE) 50 MCG/ACT nasal spray Place 2 sprays into both nostrils daily as needed for allergies or  rhinitis. 16 g 3  . glycopyrrolate (ROBINUL) 2 MG tablet Take 1 tablet (2 mg total) by mouth 2 (two) times daily. 60 tablet 11  . hydrOXYzine (ATARAX/VISTARIL) 25 MG tablet Take 1 tablet (25 mg total) by mouth every 8 (eight) hours as needed for itching. 30 tablet 1  . lidocaine-prilocaine (EMLA) cream Apply 1 application topically See admin instructions. Apply topically one hour before dialysis - Monday, Wednesday, Friday    . loratadine (CLARITIN) 10 MG tablet 10 mg every other day. (  Patient taking differently: Take 10 mg by mouth daily as needed for allergies. 10 mg every other day.) 45 tablet 1  . losartan (COZAAR) 100 MG tablet Take 1 tablet (100 mg total) by mouth daily. 90 tablet 1  . metoprolol succinate (TOPROL-XL) 100 MG 24 hr tablet TAKE 1 TABLET (100 MG)  BY MOUTH TWICE DAILY 180 tablet 3  . Multiple Vitamin (MULTIVITAMIN) tablet Take 1 tablet by mouth daily.    . multivitamin (RENA-VIT) TABS tablet Take 1 tablet by mouth at bedtime. (Patient taking differently: Take 1 tablet by mouth daily. ) 60 tablet 5  . pantoprazole (PROTONIX) 20 MG tablet Take 1 tablet (20 mg total) by mouth 2 (two) times daily before a meal. 60 tablet 1  . pregabalin (LYRICA) 100 MG capsule Take 100 mg by mouth at bedtime.     . sevelamer carbonate (RENVELA) 800 MG tablet Take 2,400 mg by mouth See admin instructions. Take 3 tablets (2400 mg) by mouth with each meal or snack - once or twice daily    . traMADol (ULTRAM) 50 MG tablet Take 50-100 mg by mouth every 6 (six) hours as needed for moderate pain.   0  . dicyclomine (BENTYL) 10 MG capsule Take 1 capsule (10 mg total) by mouth 4 (four) times daily -  before meals and at bedtime. (Patient not taking: Reported on 12/21/2017) 120 capsule 1  . Pramoxine-Benzyl Alcohol (ITCH-X) 1-10 % GEL Apply 1 application topically 3 (three) times daily as needed. (Patient not taking: Reported on 12/21/2017) 70.5 g 0    REVIEW OF SYSTEMS:  Reviewed in her history and physical  with nothing to add PHYSICAL EXAM: Vitals:   12/21/17 0701 12/21/17 0715 12/21/17 0745 12/21/17 0845  BP:  (!) 128/54 (!) 144/51 (!) 142/44  Pulse:  64 66   Resp:  15 15 17   Temp:      TempSrc:      SpO2:  93% 92%   Weight: 157 lb (71.2 kg)     Height: 5\' 1"  (1.549 m)       GENERAL: The patient is a well-nourished female, in no acute distress. The vital signs are documented above. CARDIOVASCULAR: No evidence of hematoma in her left groin.  No hematoma in her right thigh.  2+ dorsalis pedis pulse on the right.  She does not have palpable pedal pulses on the left but does have Doppler flow on the left PULMONARY: There is good air exchange  MUSCULOSKELETAL: There are no major deformities or cyanosis. NEUROLOGIC: No focal weakness or paresthesias are detected. SKIN: There are no ulcers or rashes noted. PSYCHIATRIC: The patient has a normal affect.  DATA:  None  MEDICAL ISSUES: Unclear as to the etiology of her pain.  She does not have any evidence of disruption of the artery or hematoma.  Has normal flow to her right foot with her recent intervention.  The patient is to be admitted by medicine for her hyperkalemia and need for dialysis.  No evidence of active vascular issue.  Can follow-up with Dr.Dew as planned.  Will not follow actively.  Please contact us if we can provide any assistance    Rosetta Posner, MD [facility] Tel [phone] Pager [phone]

## 2017-12-21 NOTE — ED Notes (Signed)
Nephrology bedside for evaluation, plan is for pt to have dialysis today.

## 2017-12-21 NOTE — Consult Note (Addendum)
[facility] Renal Consultation Note    Indication for Consultation:  Management of ESRD/hemodialysis; anemia, hypertension/volume and secondary hyperparathyroidism  HBZ:JIRCVE, Malka So, MD  HPI: [name] is a 77 y.o. female. ESRD 2/2 FSG on HD MWF at [facility], first starting on 03/26/12.  Past medical history significant for hx seizure d/o, neuropathy, depression and PTSD.  Patient underwent right LE angioplasty due to PAD with RLE rest pain on 12/17/17 by Dr. Lucky Cowboy.    Patient seen and examined at bedside and is a poor historian due to recent dosage of narcotics.  States she presented to the ED due to RLE pain that started "once her pain meds post surgery wore off."    States her right leg/foot feels cold, swollen and painful.  Denies CP, SOB, n/v/d, and dizziness.  Reports not attending all of her dialysis treatments this week due to pain.  Review of OP records shows her last HD was Wed 7/17.  Pertinent findings in the ED include K 7.0, CXR showing no acute abnormalities, and EKG showing no significant change. Patient has been admitted for evaluation and additional workup.   Past Medical History:  Diagnosis Date  . Abnormality of gait 03/28/2015  . Adrenal mass ([place])   . 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 Arthritis   . Back pain   . CHF (congestive heart failure) ([place])   . Chronic kidney disease    Hemo MWF  . Congestion of throat    Pt states she has a lot mucus in back throat.  . Constipation   . COPD (chronic obstructive pulmonary disease) ([place])   . Depression   . Dialysis patient [facility])    kidney  . Diverticulitis   . GERD (gastroesophageal reflux disease)   . H/O hiatal hernia   . High cholesterol   . Hoarseness of voice   . Hyperlipidemia   . Hypertension   . IBS (irritable bowel syndrome)   . Meralgia paresthetica of right side 12/26/2014  . Normal cardiac stress test 12/24/2009   lexiscan, imaging normal  . PAD  (peripheral artery disease) ([place]) 12/21/2017  . Pneumonia    [id number]  . Renal disorder   . RLS (restless legs syndrome)   . Seizures ([place])    2004 past brain surgery  . Sinus complaint   . Stroke ([place])    TIAs per patient 2 or 3  . Thyroid disease   . TIA (transient ischemic attack)   . Tubular adenoma of colon 01/2008   Past Surgical History:  Procedure Laterality Date  . A/V SHUNT INTERVENTION N/A 08/13/2017   Procedure: A/V SHUNT INTERVENTION;  Surgeon: Algernon Huxley, MD;  Location: [place] CV LAB;  Service: Cardiovascular;  Laterality: N/A;  . A/V SHUNTOGRAM Left 08/13/2017   Procedure: A/V SHUNTOGRAM;  Surgeon: Algernon Huxley, MD;  Location: [place] CV LAB;  Service: Cardiovascular;  Laterality: Left;  . ABDOMINAL HYSTERECTOMY    . ANAL RECTAL MANOMETRY N/A 07/25/2015   Procedure: ANO RECTAL MANOMETRY;  Surgeon: Mauri Pole, MD;  Location: [facility];  Service: Endoscopy;  Laterality: N/A;  . APPENDECTOMY    . AV FISTULA PLACEMENT Left 11/11/2012   Procedure: INSERTION OF ARTERIOVENOUS (AV) GORE-TEX GRAFT ARM;  Surgeon: Angelia Mould, MD;  Location: [place];  Service: Vascular;  Laterality: Left;  . [place] REMOVAL Left 11/18/2012   Procedure: REMOVAL OF LEFT UPPER ARM ARTERIOVENOUS GORETEX  GRAFT ([place]);  Surgeon: Angelia Mould, MD;  Location: [place];  Service: Vascular;  Laterality: Left;  . CARDIAC CATHETERIZATION  2003   normal  . CHOLECYSTECTOMY     Open mid line incision  . frontal craniotomy  2002   indication = sinusitis  . INSERTION OF DIALYSIS CATHETER     Left  . LOWER EXTREMITY ANGIOGRAPHY Right 12/17/2017   Procedure: LOWER EXTREMITY ANGIOGRAPHY;  Surgeon: Algernon Huxley, MD;  Location: [place] CV LAB;  Service: Cardiovascular;  Laterality: Right;  . NASAL SINUS SURGERY Bilateral 2002  . PATCH ANGIOPLASTY Left 11/18/2012   Procedure: PATCH ANGIOPLASTY;  Surgeon: Angelia Mould, MD;  Location: [place];  Service: Vascular;   Laterality: Left;  . PERIPHERAL VASCULAR CATHETERIZATION Left 03/08/2015   Procedure: A/V Shuntogram/Fistulagram;  Surgeon: Algernon Huxley, MD;  Location: [place] CV LAB;  Service: Cardiovascular;  Laterality: Left;  . PERIPHERAL VASCULAR CATHETERIZATION Left 03/08/2015   Procedure: A/V Shunt Intervention;  Surgeon: Algernon Huxley, MD;  Location: [place] [facility];  Service: Cardiovascular;  Laterality: Left;  . RECTAL ULTRASOUND N/A 10/05/2015   Procedure: ANAL ULTRASOUND WITH PROBE;  Surgeon: Leighton Ruff, MD;  [facility]: [facility];  Service: Endoscopy;  Laterality: N/A;  . REVISON OF ARTERIOVENOUS FISTULA  05/11/2012   Procedure: REVISON OF ARTERIOVENOUS FISTULA;  Surgeon: Elam Dutch, MD;  Location: [place];  Service: Vascular;  Laterality: Right;  . TUBAL LIGATION     Family History  Problem Relation Age of Onset  . Thyroid cancer Mother   . Heart disease Father   . Hypertension Father   . Heart attack Father   . Lupus Daughter   . Breast cancer Sister   . Thyroid cancer Sister   . Kidney disease Sister   . Colon cancer Neg Hx    Social History:  reports that she has been smoking cigarettes.  She has a 60.00 pack-year smoking history. She has never used smokeless tobacco. She reports that she does not drink alcohol or use drugs. Allergies  Allergen Reactions  . Tuberculin Tests Hives    "blisters"  . Valacyclovir Other (See Comments)    Confusion and nervousness  . Codeine Nausea And Vomiting  . Penicillins Rash    No problems breathing. Has tolerated omnicef in past without issue Has patient had a PCN reaction causing immediate rash, facial/tongue/throat swelling, SOB or lightheadedness with hypotension: Yes Has patient had a PCN reaction causing severe rash involving mucus membranes or skin necrosis: No Has patient had a PCN reaction that required hospitalization No Has patient had a PCN reaction occurring within the last 10 years: No If all of the above answers are  "NO", then may proceed with Cephalosporin  . Sulfamethoxazole Rash   Prior to Admission medications   Medication Sig Start Date End Date Taking? Authorizing Provider  acetaminophen (TYLENOL) 500 MG tablet Take 500 mg by mouth every 6 (six) hours as needed for moderate pain.    Yes [provider]  albuterol (PROVENTIL HFA;VENTOLIN HFA) 108 (90 Base) MCG/ACT inhaler Inhale 2 puffs into the lungs every 4 (four) hours as needed for wheezing or shortness of breath. 03/11/17  Yes Martinique, Betty G, MD  allopurinol (ZYLOPRIM) 100 MG tablet Take 100 mg by mouth once daily on non-dialysis days (Tues/Thurs/Sat/Sun) Patient taking differently: Take 100 mg by mouth See admin instructions. Take one tablet (100 mg) by mouth once daily on non-dialysis days (Tues/Thurs/Sat/Sun) 11/13/15  Yes Smiley Houseman,  MD  amLODipine (NORVASC) 10 MG tablet TAKE 1 TABLET BY MOUTH AT BEDTIME Patient taking differently: TAKE 1 TABLET (10 MG) BY MOUTH AT BEDTIME 06/01/17  Yes Martinique, Betty G, MD  aspirin EC 81 MG tablet Take 81 mg by mouth daily.    Yes [provider]  atorvastatin (LIPITOR) 40 MG tablet Take 1 tablet (40 mg total) by mouth daily. Patient taking differently: Take 40 mg by mouth at bedtime.  02/26/15  Yes Rumley, [place] N, DO  azelastine (ASTELIN) 0.1 % nasal spray Place 1 spray into both nostrils 2 (two) times daily. Use in each nostril as directed Patient taking differently: Place 1 spray into both nostrils 2 (two) times daily as needed. Use in each nostril as directed 08/07/16  Yes Martinique, Betty G, MD  B Complex-C-Zn-Folic Acid (DIALYVITE/ZINC) TABS Take 1 tablet by mouth daily. 09/25/17  Yes [provider]  budesonide-formoterol (SYMBICORT) 160-4.5 MCG/ACT inhaler Inhale 2 puffs into the lungs 2 (two) times daily. 07/26/17  Yes Dana Allan I, MD  cinacalcet (SENSIPAR) 90 MG tablet Take 90 mg by mouth at bedtime.   Yes [provider]  cloNIDine (CATAPRES) 0.2 MG  tablet Take 0.2 mg by mouth daily.  10/08/17  Yes [provider]  clopidogrel (PLAVIX) 75 MG tablet Take 1 tablet (75 mg total) by mouth daily. 12/17/17  Yes Dew, Erskine Squibb, MD  DULoxetine (CYMBALTA) 30 MG capsule Take 2 capsules (60 mg total) by mouth daily. Patient taking differently: Take 60 mg by mouth at bedtime.  01/08/17  Yes Kathrynn Ducking, MD  fluticasone [facility]) 50 MCG/ACT nasal spray Place 2 sprays into both nostrils daily as needed for allergies or rhinitis. 10/29/17  Yes Martin[place], Betty G, MD  glycopyrrolate (ROBINUL) 2 MG tablet Take 1 tablet (2 mg total) by mouth 2 (two) times daily. 08/25/17  Yes Ladene Artist, MD  hydrOXYzine (ATARAX/VISTARIL) 25 MG tablet Take 1 tablet (25 mg total) by mouth every 8 (eight) hours as needed for itching. 09/16/17  Yes [place]ique, Betty G, MD  lidocaine-prilocaine (EMLA) cream Apply 1 application topically See admin instructions. Apply topically one hour before dialysis - Monday, Wednesday, Friday   Yes [provider]  loratadine (CLARITIN) 10 MG tablet 10 mg every other day. Patient taking differently: Take 10 mg by mouth daily as needed for allergies. 10 mg every other day. 10/29/17  Yes [place]ique, Betty G, MD  losartan (COZAAR) 100 MG tablet Take 1 tablet (100 mg total) by mouth daily. 10/29/17  Yes [place], Betty G, MD  metoprolol succinate (TOPROL-XL) 100 MG 24 hr tablet TAKE 1 TABLET (100 MG)  BY MOUTH TWICE DAILY 10/13/17  Yes Martinique, Betty G, MD  Multiple Vitamin (MULTIVITAMIN) tablet Take 1 tablet by mouth daily.   Yes [provider]  multivitamin (RENA-VIT) TABS tablet Take 1 tablet by mouth at bedtime. Patient taking differently: Take 1 tablet by mouth daily.  01/01/15  Yes Haney, Alyssa A, MD  pantoprazole (PROTONIX) 20 MG tablet Take 1 tablet (20 mg total) by mouth 2 (two) times daily before a meal. 09/08/17  Yes [place], Betty G, MD  pregabalin (LYRICA) 100 MG capsule Take 100 mg by mouth at bedtime.    Yes [provider]  sevelamer carbonate (RENVELA) 800 MG tablet Take 2,400 mg by mouth See admin instructions. Take 3 tablets (2400 mg) by mouth with each meal or snack - once or twice daily   Yes [provider]  traMADol (ULTRAM) 50  MG tablet Take 50-100 mg by mouth every 6 (six) hours as needed for moderate pain.  12/07/17  Yes [provider]  dicyclomine (BENTYL) 10 MG capsule Take 1 capsule (10 mg total) by mouth 4 (four) times daily -  before meals and at bedtime. Patient not taking: Reported on 12/21/2017 08/10/17   Zehr, Laban Emperor, PA-C  Pramoxine-Benzyl Alcohol (ITCH-X) 1-10 % GEL Apply 1 application topically 3 (three) times daily as needed. Patient not taking: Reported on 12/21/2017 09/16/17   [place], Betty G, MD   Current Facility-Administered Medications  Medication Dose Route Frequency Provider Last Rate Last Dose  . acetaminophen (TYLENOL) tablet 650 mg  650 mg Oral Q6H PRN Rondel Jumbo, PA-C       Or  . acetaminophen (TYLENOL) suppository 650 mg  650 mg Rectal Q6H PRN Rondel Jumbo, PA-C      . allopurinol (ZYLOPRIM) tablet 100 mg  100 mg Oral See admin instructions Rondel Jumbo, PA-C      . amLODipine (NORVASC) tablet 10 mg  10 mg Oral QHS Wertman, Sara E, PA-C      . aspirin EC tablet 81 mg  81 mg Oral Daily Wertman, Sara E, PA-C      . atorvastatin (LIPITOR) tablet 40 mg  40 mg Oral QHS Wertman, Sara E, PA-C      . azelastine (ASTELIN) 0.1 % nasal spray 1 spray  1 spray Each Nare BID PRN Rondel Jumbo, PA-C      . bisacodyl (DULCOLAX) suppository 10 mg  10 mg Rectal Daily PRN Rondel Jumbo, PA-C      . Chlorhexidine Gluconate Cloth 2 % PADS 6 each  6 each Topical Q0600 Rondel Jumbo, PA-C      . cinacalcet (SENSIPAR) tablet 90 mg  90 mg Oral QHS Rondel Jumbo, PA-C      . [START ON 12/22/2017] cloNIDine (CATAPRES) tablet 0.2 mg  0.2 mg Oral Daily Wertman, Sara E, PA-C      . clopidogrel (PLAVIX) tablet 75 mg  75 mg Oral Daily Rondel Jumbo, PA-C      .  DULoxetine (CYMBALTA) DR capsule 60 mg  60 mg Oral QHS Wertman, Sara E, PA-C      . glycopyrrolate (ROBINUL) tablet 2 mg  2 mg Oral BID Rondel Jumbo, PA-C      . heparin injection 5,000 Units  5,000 Units Subcutaneous Q8H Wertman, Sara E, PA-C      . hydrOXYzine (ATARAX/VISTARIL) tablet 25 mg  25 mg Oral Q8H PRN Rondel Jumbo, PA-C      . lidocaine-prilocaine (EMLA) cream 1 application  1 application Topical See admin instructions Rondel Jumbo, PA-C      . metoprolol succinate (TOPROL-XL) 24 hr tablet 100 mg  100 mg Oral BID Wertman, Sara E, PA-C      . multivitamin (RENA-VIT) tablet 1 tablet  1 tablet Oral QHS Wertman, Sara E, PA-C      . ondansetron (ZOFRAN) tablet 4 mg  4 mg Oral Q6H PRN Rondel Jumbo, PA-C       Or  . ondansetron (ZOFRAN) injection 4 mg  4 mg Intravenous Q6H PRN Rondel Jumbo, PA-C      . pantoprazole (PROTONIX) EC tablet 20 mg  20 mg Oral BID AC Wertman, Sara E, PA-C      . pregabalin (LYRICA) capsule 100 mg  100 mg Oral QHS Wertman, Coralee Pesa, PA-C      .  senna-docusate (Senokot-S) tablet 1 tablet  1 tablet Oral QHS PRN Rondel Jumbo, PA-C      . sevelamer carbonate (RENVELA) tablet 2,400 mg  2,400 mg Oral See admin instructions Rondel Jumbo, PA-C       Current Outpatient Medications  Medication Sig Dispense Refill  . acetaminophen (TYLENOL) 500 MG tablet Take 500 mg by mouth every 6 (six) hours as needed for moderate pain.     Marland Kitchen albuterol (PROVENTIL HFA;VENTOLIN HFA) 108 (90 Base) MCG/ACT inhaler Inhale 2 puffs into the lungs every 4 (four) hours as needed for wheezing or shortness of breath. 1 Inhaler 1  . allopurinol (ZYLOPRIM) 100 MG tablet Take 100 mg by mouth once daily on non-dialysis days (Tues/Thurs/Sat/Sun) (Patient taking differently: Take 100 mg by mouth See admin instructions. Take one tablet (100 mg) by mouth once daily on non-dialysis days (Tues/Thurs/Sat/Sun)) 30 tablet 0  . amLODipine (NORVASC) 10 MG tablet TAKE 1 TABLET BY MOUTH AT BEDTIME  (Patient taking differently: TAKE 1 TABLET (10 MG) BY MOUTH AT BEDTIME) 90 tablet 1  . aspirin EC 81 MG tablet Take 81 mg by mouth daily.     Marland Kitchen atorvastatin (LIPITOR) 40 MG tablet Take 1 tablet (40 mg total) by mouth daily. (Patient taking differently: Take 40 mg by mouth at bedtime. ) 90 tablet 3  . azelastine (ASTELIN) 0.1 % nasal spray Place 1 spray into both nostrils 2 (two) times daily. Use in each nostril as directed (Patient taking differently: Place 1 spray into both nostrils 2 (two) times daily as needed. Use in each nostril as directed) 30 mL 6  . B Complex-C-Zn-Folic Acid (DIALYVITE/ZINC) TABS Take 1 tablet by mouth daily.  6  . budesonide-formoterol (SYMBICORT) 160-4.5 MCG/ACT inhaler Inhale 2 puffs into the lungs 2 (two) times daily. 1 Inhaler 12  . cinacalcet (SENSIPAR) 90 MG tablet Take 90 mg by mouth at bedtime.    . cloNIDine (CATAPRES) 0.2 MG tablet Take 0.2 mg by mouth daily.   5  . clopidogrel (PLAVIX) 75 MG tablet Take 1 tablet (75 mg total) by mouth daily. 30 tablet 11  . DULoxetine (CYMBALTA) 30 MG capsule Take 2 capsules (60 mg total) by mouth daily. (Patient taking differently: Take 60 mg by mouth at bedtime. ) 180 capsule 3  . fluticasone (FLONASE) 50 MCG/ACT nasal spray Place 2 sprays into both nostrils daily as needed for allergies or rhinitis. 16 g 3  . glycopyrrolate (ROBINUL) 2 MG tablet Take 1 tablet (2 mg total) by mouth 2 (two) times daily. 60 tablet 11  . hydrOXYzine (ATARAX/VISTARIL) 25 MG tablet Take 1 tablet (25 mg total) by mouth every 8 (eight) hours as needed for itching. 30 tablet 1  . lidocaine-prilocaine (EMLA) cream Apply 1 application topically See admin instructions. Apply topically one hour before dialysis - Monday, Wednesday, Friday    . loratadine (CLARITIN) 10 MG tablet 10 mg every other day. (Patient taking differently: Take 10 mg by mouth daily as needed for allergies. 10 mg every other day.) 45 tablet 1  . losartan (COZAAR) 100 MG tablet Take 1  tablet (100 mg total) by mouth daily. 90 tablet 1  . metoprolol succinate (TOPROL-XL) 100 MG 24 hr tablet TAKE 1 TABLET (100 MG)  BY MOUTH TWICE DAILY 180 tablet 3  . Multiple Vitamin (MULTIVITAMIN) tablet Take 1 tablet by mouth daily.    . multivitamin (RENA-VIT) TABS tablet Take 1 tablet by mouth at bedtime. (Patient taking differently: Take 1 tablet by mouth  daily. ) 60 tablet 5  . pantoprazole (PROTONIX) 20 MG tablet Take 1 tablet (20 mg total) by mouth 2 (two) times daily before a meal. 60 tablet 1  . pregabalin (LYRICA) 100 MG capsule Take 100 mg by mouth at bedtime.     . sevelamer carbonate (RENVELA) 800 MG tablet Take 2,400 mg by mouth See admin instructions. Take 3 tablets (2400 mg) by mouth with each meal or snack - once or twice daily    . traMADol (ULTRAM) 50 MG tablet Take 50-100 mg by mouth every 6 (six) hours as needed for moderate pain.   0  . dicyclomine (BENTYL) 10 MG capsule Take 1 capsule (10 mg total) by mouth 4 (four) times daily -  before meals and at bedtime. (Patient not taking: Reported on 12/21/2017) 120 capsule 1  . Pramoxine-Benzyl Alcohol (ITCH-X) 1-10 % GEL Apply 1 application topically 3 (three) times daily as needed. (Patient not taking: Reported on 12/21/2017) 70.5 g 0   Labs: Basic Metabolic Panel: Recent Labs  Lab 12/15/17 0959 12/21/17 0847  NA  --  130*  K 4.1 7.0*  CL  --  100  GLUCOSE  --  69*  BUN  --  58*  CREATININE  --  14.50*   CBC: Recent Labs  Lab 12/21/17 0840 12/21/17 0847  WBC 12.4*  --   NEUTROABS 9.3*  --   HGB 10.8* 11.2*  HCT 33.7* 33.0*  MCV 90.3  --   PLT 215  --    CBG: Recent Labs  Lab 12/21/17 1028 12/21/17 1045 12/21/17 1107 12/21/17 1129  GLUCAP 57* 119* 117* 80   Studies/Results: Dg Chest Port 1 View  Result Date: 12/21/2017 CLINICAL DATA:  77 year old female with acute chest pain today. EXAM: PORTABLE CHEST 1 VIEW COMPARISON:  08/20/2017 and earlier. FINDINGS: Portable AP semi upright view at 0743 hours.  Left side HERO graft hemodialysis access redemonstrated. Visible portions appear stable. Chronic elevation of the right hemidiaphragm is stable. Stable mild cardiomegaly and mediastinal contours. No pneumothorax, pulmonary edema, pleural effusion or consolidation. Mild bibasilar atelectasis appears stable. Paucity of bowel gas in the upper abdomen. Visualized tracheal air column is within normal limits. No acute osseous abnormality identified. IMPRESSION: Chronic bibasilar atelectasis. No acute cardiopulmonary abnormality. Electronically Signed   By: Genevie Ann M.D.   On: 12/21/2017 08:04    ROS: Limited review of symptoms completed due to patient current level of alertness.  Physical Exam: Vitals:   12/21/17 0845 12/21/17 1110 12/21/17 1130 12/21/17 1145  BP: (!) 142/44 (!) 117/41 (!) 111/46   Pulse:  67 69   Resp: 17 13 (!) 24 (!) 21  Temp:      TempSrc:      SpO2:  98% 96%   Weight:      Height:         General: WDWN, NAD, chronically ill appear, elderly female.  Head: NCAT sclera not icteric MMM Neck: Supple. No lymphadenopathy Lungs: CTA bilaterally. No wheeze, rales or rhonchi. Breathing is unlabored. Heart: RRR. No murmur, rubs or gallops.  Abdomen: soft, nontender, +BS, no guarding, no rebound tenderness Lower extremities:Right - trace edema, +tenderness palpation Left - no edema, NT. B/l  no ischemic changes, or open wounds Neuro: Drowsy. Responds to questions with eyes closed. Moves all extremities spontaneously. Psych:  Responds to questions appropriately with a normal affect. Dialysis Access: LU HeRO +b/t  Dialysis Orders:  MWF - [facility]  3.75hrs, BFR 400, DFR AF1.5,  EDW  71kg, 2K/ 2.25Ca Access: LU HeRO  Heparin 4000 unit bolus Mircera 50 mcg q2wks - last 12/07/17 Hectorol 40mcg IV qHD     Assessment/Plan: 1.  RLE pain - Hx PAD w/RLE resting pain, intervention by Dr. Lucky Cowboy on 7/18. [facility] consulting.  2. Hyperkalemia  - 2/2 missed HD.  K 7.0. HD planned for today using 1K  bath x2hrs, then 2K for remainder of HD. 3.  ESRD -  HD MWF.  Orders written for HD today per regular schedule with low K bath due to hyperkalemia.   4.  Hypertension/volume  - BP variable.  Does not appear grossly volume overloaded on exam.  If weights correct close to EDW.  Need standing weights to better assess.  Net UF goal 2-3L as tolerated.  5.  Anemia of CKD - Hgb 11.2. No indication for ESA at this time.  6.  Secondary Hyperparathyroidism -  Ca in goal as OP. Phos slightly elevated as OP.  Continue binders, and VDRA. 7.  Nutrition - Renal diet with fluid restrictions. Renavite. 8. Hx seizure d/o - per primary 9. Hx depression/PTSD - per primary  Jen Mow, PA-C [facility] Pager: [phone] 12/21/2017, 12:49 PM   Pt seen, examined and agree w A/P as above.  Kelly Splinter MD [employer] pager [phone]   12/21/2017, 3:06 PM

## 2017-12-21 NOTE — Progress Notes (Signed)
Patient moaning,sitting on side (refuses to get back up in bed), of bed,rocking. Refused all meds, pulled tele off and oxygen off and refused to have it replaced. Cursing at RN and daughter (on phone) for not coming to pick her up. Wants to leave AMA. Feels she has been "committed" but is a/o to self.place and time. On call texted.

## 2017-12-21 NOTE — ED Notes (Signed)
Report given to dialysis and to [place]. Pt transported to dialysis by this RN

## 2017-12-21 NOTE — H&P (Signed)
History and Physical    [name] JME:[id number] DOB: [birth year] DOA: 12/21/2017   PCP: Martinique, Betty G, MD   Patient coming from:  Home    Chief Complaint: Severe Right leg pain   HPI: [name] is a 77 y.o. female with extensive medical history listed below, including COPD, hypertension, CHF, ESRD on hemodialysis Monday Wednesday and Friday, history of major depression, chronic anemia, IBS, restless leg syndrome, chronic peripheral neuropathy, peripheral vascular disease-peripheral arterial disease, status post right superficial femoral artery stenting by Dr. Lucky Cowboy on 12/17/2017, presenting with severe right lower extremity pain, sharp, "comes in bouts ", progressing since last Thursday, extending from the toes to the right groin.  The patient reports having neuropathy, and admits to be very hypersensitive to touch.  At home, she took tramadol, but did not seem to help, last taken at 3 AM today.  The left leg has also some pain, but not as severe as on the right.  She was unable to stand or getting a wheelchair on Friday, and was unable to go to dialysis due to the symptoms.  She denies any worsening shortness of breath, pleuritic chest pain, nausea or vomiting.  She has mild chest pain, but describes that the ache is similar to the sensation she feels when needing dialysis.  She denies any fever or chills or night sweats.  She denies any recent sick contacts, or recent travels.  No changes in her medications, and she is compliant with them.  She denies any herbal products, hormonal products, alcohol or recent tobacco.  She denies any numbness or tingling.  She denies any unilateral weakness.  She denies any difference in temperature on her feet.   ED Course:  BP (!) 142/44   Pulse 66   Temp 97.7 F (36.5 C) (Oral)   Resp 17   Ht 5\' 1"  (1.549 m)   Wt 71.2 kg (157 lb)   SpO2 92%   BMI 29.66 kg/m   Potassium was 7, treated with albuterol nebulizer, insulin D50, and calcium gluconate CBC  remarkable for leukocytosis, with a white count of 12, hemoglobin 11.2, improved from earlier this week, platelets 215 CBGs were low at 69, but she had not consumed any food since yesterday Chest x-ray without acute findings Lower extremity ultrasound is pending Vascular consultation was obtained.  Dr. Donnetta Hutching concludes that these symptoms do not appear to be a vascular nature, and perhaps related to neuropathy.  He recommended pain control. EKG sinus rhythm, without acute findings Nephrology to proceed with dialysis today.  Creatinine is 14.5 Sodium 130,  Troponin 0 0.03 Last 2D echo February 2019 shows EF 55 to 22%, grade 2 diastolic dysfunction, systolic function normal.  Review of Systems:  As per HPI otherwise all other systems reviewed and are negative  Past Medical History:  Diagnosis Date  . Abnormality of gait 03/28/2015  . Adrenal mass ([place])   . 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 Arthritis   . Back pain   . CHF (congestive heart failure) ([place])   . Chronic kidney disease    Hemo MWF  . Congestion of throat    Pt states she has a lot mucus in back throat.  . Constipation   . COPD (chronic obstructive pulmonary disease) ([place])   . Depression   . Dialysis patient [facility])    kidney  . Diverticulitis   . GERD (gastroesophageal reflux disease)   .  H/O hiatal hernia   . High cholesterol   . Hoarseness of voice   . Hyperlipidemia   . Hypertension   . IBS (irritable bowel syndrome)   . Meralgia paresthetica of right side 12/26/2014  . Normal cardiac stress test 12/24/2009   lexiscan, imaging normal  . PAD (peripheral artery disease) ([place]) 12/21/2017  . Pneumonia    [id number]  . Renal disorder   . RLS (restless legs syndrome)   . Seizures ([place])    2004 past brain surgery  . Sinus complaint   . Stroke ([place])    TIAs per patient 2 or 3  . Thyroid disease   . TIA (transient ischemic attack)   . Tubular adenoma of colon 01/2008     Past Surgical History:  Procedure Laterality Date  . A/V SHUNT INTERVENTION N/A 08/13/2017   Procedure: A/V SHUNT INTERVENTION;  Surgeon: Algernon Huxley, MD;  Location: [place] [facility];  Service: Cardiovascular;  Laterality: N/A;  . A/V SHUNTOGRAM Left 08/13/2017   Procedure: A/V SHUNTOGRAM;  Surgeon: Algernon Huxley, MD;  Location: [place] [facility];  Service: Cardiovascular;  Laterality: Left;  . ABDOMINAL HYSTERECTOMY    . ANAL RECTAL MANOMETRY N/A 07/25/2015   Procedure: ANO RECTAL MANOMETRY;  Surgeon: Mauri Pole, MD;  Location: [facility];  Service: Endoscopy;  Laterality: N/A;  . APPENDECTOMY    . AV FISTULA PLACEMENT Left 11/11/2012   Procedure: INSERTION OF ARTERIOVENOUS (AV) GORE-TEX GRAFT ARM;  Surgeon: Angelia Mould, MD;  Location: [place];  Service: Vascular;  Laterality: Left;  . [place] REMOVAL Left 11/18/2012   Procedure: REMOVAL OF LEFT UPPER ARM ARTERIOVENOUS GORETEX GRAFT ([place]);  Surgeon: Angelia Mould, MD;  Location: [place];  Service: Vascular;  Laterality: Left;  . CARDIAC CATHETERIZATION  2003   normal  . CHOLECYSTECTOMY     Open mid line incision  . frontal craniotomy  2002   indication = sinusitis  . INSERTION OF DIALYSIS CATHETER     Left  . LOWER EXTREMITY ANGIOGRAPHY Right 12/17/2017   Procedure: LOWER EXTREMITY ANGIOGRAPHY;  Surgeon: Algernon Huxley, MD;  Location: [place] [facility];  Service: Cardiovascular;  Laterality: Right;  . NASAL SINUS SURGERY Bilateral 2002  . PATCH ANGIOPLASTY Left 11/18/2012   Procedure: PATCH ANGIOPLASTY;  Surgeon: Angelia Mould, MD;  Location: [place];  Service: Vascular;  Laterality: Left;  . PERIPHERAL VASCULAR CATHETERIZATION Left 03/08/2015   Procedure: A/V Shuntogram/Fistulagram;  Surgeon: Algernon Huxley, MD;  Location: [place] [facility];  Service: Cardiovascular;  Laterality: Left;  . PERIPHERAL VASCULAR CATHETERIZATION Left 03/08/2015   Procedure: A/V Shunt Intervention;  Surgeon: Algernon Huxley, MD;   Location: [place] [facility];  Service: Cardiovascular;  Laterality: Left;  . RECTAL ULTRASOUND N/A 10/05/2015   Procedure: ANAL ULTRASOUND WITH PROBE;  Surgeon: Leighton Ruff, MD;  [facility];  Service: Endoscopy;  Laterality: N/A;  . REVISON OF ARTERIOVENOUS FISTULA  05/11/2012   Procedure: REVISON OF ARTERIOVENOUS FISTULA;  Surgeon: Elam Dutch, MD;  Location: [place];  Service: Vascular;  Laterality: Right;  . TUBAL LIGATION      Social History Social History   Socioeconomic History  . Marital status: Legally Separated    Spouse name: Not on file  . Number of children: 3  . Years of education: 69  . Highest education level: Not on file  Occupational History  . Occupation: retired  Scientific laboratory technician  . Financial resource strain: Not on file  .  Food insecurity:    Worry: Not on file    Inability: Not on file  . Transportation needs:    Medical: Not on file    Non-medical: Not on file  Tobacco Use  . Smoking status: Current Every Day Smoker    Packs/day: 1.00    Years: 60.00    Pack years: 60.00    Types: Cigarettes  . Smokeless tobacco: Never Used  . Tobacco comment: smoker since 77 yo.  1.5 ppd of [place] 100 lights. ; form given 10-23-16  Substance and Sexual Activity  . Alcohol use: No    Alcohol/week: 0.0 oz  . Drug use: No    Comment: No hx of IV drug use  . Sexual activity: Never    Birth control/protection: None  Lifestyle  . Physical activity:    Days per week: Not on file    Minutes per session: Not on file  . Stress: Not on file  Relationships  . Social connections:    Talks on phone: Not on file    Gets together: Not on file    Attends religious service: Not on file    Active member of club or organization: Not on file    Attends meetings of clubs or organizations: Not on file    Relationship status: Not on file  . Intimate partner violence:    Fear of current or ex partner: Not on file    Emotionally abused: Not on file    Physically  abused: Not on file    Forced sexual activity: Not on file  Other Topics Concern  . Not on file  Social History Narrative   ** Merged History Encounter **       Lives in [place] with Daughter, granddaughters (twins) and mentally challenged niece.   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   Patient has a college education.   Patient drinks one cup of caffeine daily.              Allergies  Allergen Reactions  . Tuberculin Tests Hives    "blisters"  . Valacyclovir Other (See Comments)    Confusion and nervousness  . Codeine Nausea And Vomiting  . Penicillins Rash    No problems breathing. Has tolerated omnicef in past without issue Has patient had a PCN reaction causing immediate rash, facial/tongue/throat swelling, SOB or lightheadedness with hypotension: Yes Has patient had a PCN reaction causing severe rash involving mucus membranes or skin necrosis: No Has patient had a PCN reaction that required hospitalization No Has patient had a PCN reaction occurring within the last 10 years: No If all of the above answers are "NO", then may proceed with Cephalosporin  . Sulfamethoxazole Rash    Family History  Problem Relation Age of Onset  . Thyroid cancer Mother   . Heart disease Father   . Hypertension Father   . Heart attack Father   . Lupus Daughter   . Breast cancer Sister   . Thyroid cancer Sister   . Kidney disease Sister   . Colon cancer Neg Hx        Prior to Admission medications   Medication Sig Start Date End Date Taking? Authorizing Provider  acetaminophen (TYLENOL) 500 MG tablet Take 500 mg by mouth every 6 (six) hours as needed for moderate pain.    Yes [provider]  albuterol (PROVENTIL HFA;VENTOLIN HFA) 108 (90 Base) MCG/ACT inhaler Inhale 2 puffs into the lungs every 4 (four) hours as  needed for wheezing or shortness of breath. 03/11/17  Yes Martinique, Betty G, MD  allopurinol (ZYLOPRIM) 100 MG tablet Take 100 mg by mouth once  daily on non-dialysis days (Tues/Thurs/Sat/Sun) Patient taking differently: Take 100 mg by mouth See admin instructions. Take one tablet (100 mg) by mouth once daily on non-dialysis days (Tues/Thurs/Sat/Sun) 11/13/15  Yes Smiley Houseman, MD  amLODipine (NORVASC) 10 MG tablet TAKE 1 TABLET BY MOUTH AT BEDTIME Patient taking differently: TAKE 1 TABLET (10 MG) BY MOUTH AT BEDTIME 06/01/17  Yes Martinique, Betty G, MD  aspirin EC 81 MG tablet Take 81 mg by mouth daily.    Yes [provider]  atorvastatin (LIPITOR) 40 MG tablet Take 1 tablet (40 mg total) by mouth daily. Patient taking differently: Take 40 mg by mouth at bedtime.  02/26/15  Yes Rumley, [place] N, DO  azelastine (ASTELIN) 0.1 % nasal spray Place 1 spray into both nostrils 2 (two) times daily. Use in each nostril as directed Patient taking differently: Place 1 spray into both nostrils 2 (two) times daily as needed. Use in each nostril as directed 08/07/16  Yes Martinique, Betty G, MD  B Complex-C-Zn-Folic Acid (DIALYVITE/ZINC) TABS Take 1 tablet by mouth daily. 09/25/17  Yes [provider]  budesonide-formoterol (SYMBICORT) 160-4.5 MCG/ACT inhaler Inhale 2 puffs into the lungs 2 (two) times daily. 07/26/17  Yes Dana Allan I, MD  cinacalcet (SENSIPAR) 90 MG tablet Take 90 mg by mouth at bedtime.   Yes [provider]  cloNIDine (CATAPRES) 0.2 MG tablet Take 0.2 mg by mouth daily.  10/08/17  Yes [provider]  clopidogrel (PLAVIX) 75 MG tablet Take 1 tablet (75 mg total) by mouth daily. 12/17/17  Yes Dew, Erskine Squibb, MD  DULoxetine (CYMBALTA) 30 MG capsule Take 2 capsules (60 mg total) by mouth daily. Patient taking differently: Take 60 mg by mouth at bedtime.  01/08/17  Yes Kathrynn Ducking, MD  fluticasone [facility]) 50 MCG/ACT nasal spray Place 2 sprays into both nostrils daily as needed for allergies or rhinitis. 10/29/17  Yes Martinique, Betty G, MD  glycopyrrolate (ROBINUL) 2 MG tablet Take 1 tablet (2 mg  total) by mouth 2 (two) times daily. 08/25/17  Yes Ladene Artist, MD  hydrOXYzine (ATARAX/VISTARIL) 25 MG tablet Take 1 tablet (25 mg total) by mouth every 8 (eight) hours as needed for itching. 09/16/17  Yes Martinique, Betty G, MD  lidocaine-prilocaine (EMLA) cream Apply 1 application topically See admin instructions. Apply topically one hour before dialysis - Monday, Wednesday, Friday   Yes [provider]  loratadine (CLARITIN) 10 MG tablet 10 mg every other day. Patient taking differently: Take 10 mg by mouth daily as needed for allergies. 10 mg every other day. 10/29/17  Yes Martinique, Betty G, MD  losartan (COZAAR) 100 MG tablet Take 1 tablet (100 mg total) by mouth daily. 10/29/17  Yes Martinique, Betty G, MD  metoprolol succinate (TOPROL-XL) 100 MG 24 hr tablet TAKE 1 TABLET (100 MG)  BY MOUTH TWICE DAILY 10/13/17  Yes Martinique, Betty G, MD  Multiple Vitamin (MULTIVITAMIN) tablet Take 1 tablet by mouth daily.   Yes [provider]  multivitamin (RENA-VIT) TABS tablet Take 1 tablet by mouth at bedtime. Patient taking differently: Take 1 tablet by mouth daily.  01/01/15  Yes Haney, Alyssa A, MD  pantoprazole (PROTONIX) 20 MG tablet Take 1 tablet (20 mg total) by mouth 2 (two) times daily before a meal. 09/08/17  Yes Martinique, Betty G, MD  pregabalin (LYRICA) 100 MG capsule Take 100 mg by mouth at bedtime.    Yes [provider]  sevelamer carbonate (RENVELA) 800 MG tablet Take 2,400 mg by mouth See admin instructions. Take 3 tablets (2400 mg) by mouth with each meal or snack - once or twice daily   Yes [provider]  traMADol (ULTRAM) 50 MG tablet Take 50-100 mg by mouth every 6 (six) hours as needed for moderate pain.  12/07/17  Yes [provider]  dicyclomine (BENTYL) 10 MG capsule Take 1 capsule (10 mg total) by mouth 4 (four) times daily -  before meals and at bedtime. Patient not taking: Reported on 12/21/2017 08/10/17   Zehr, Laban Emperor, PA-C  Pramoxine-Benzyl  Alcohol (ITCH-X) 1-10 % GEL Apply 1 application topically 3 (three) times daily as needed. Patient not taking: Reported on 12/21/2017 09/16/17   Martin[place], Betty G, MD     Physical Exam:  Vitals:   12/21/17 0701 12/21/17 0715 12/21/17 0745 12/21/17 0845  BP:  (!) 128/54 (!) 144/51 (!) 142/44  Pulse:  64 66   Resp:  15 15 17   Temp:      TempSrc:      SpO2:  93% 92%   Weight: 71.2 kg (157 lb)     Height: 5\' 1"  (1.549 m)      Constitutional: NAD, ill-appearing, appears uncomfortable due to right lower extremity pain  eyes: PERRL, lids and conjunctivae normal ENMT: Mucous membranes are moist, without exudate or lesions  Neck:  supple, no masses, no thyromegaly, JVD is appreciated Respiratory: Decreased breath sounds at the bases, no wheezing, no crackles. Normal respiratory effort  Cardiovascular: Regular rate and rhythm, 2 out of 6 systolic murmur, rubs or gallops. No extremity edema, but her right lower extremity slightly larger than the left, with exquisite tenderness to light touch.Pulses are normal on the right, on the left no palpable pedal pulses but the Dopplers are positive. No carotid bruits.  Legs are warm to touch equally.  No cords palpable Abdomen: Soft, obese non tender, No hepatosplenomegaly. Bowel sounds positive.  Musculoskeletal: no clubbing / cyanosis. Moves all extremities Skin: no jaundice, No lesions.  Neurologic: Sensation intact  Strength equal in all extremities Psychiatric:   Alert and oriented x 3.  Somewhat anxious mood.      Labs on Admission: I have personally reviewed following labs and imaging studies  CBC: Recent Labs  Lab 12/21/17 0840 12/21/17 0847  WBC 12.4*  --   NEUTROABS 9.3*  --   HGB 10.8* 11.2*  HCT 33.7* 33.0*  MCV 90.3  --   PLT 215  --     Basic Metabolic Panel: Recent Labs  Lab 12/15/17 0959 12/21/17 0847  NA  --  130*  K 4.1 7.0*  CL  --  100  GLUCOSE  --  69*  BUN  --  58*  CREATININE  --  14.50*    GFR: Estimated  Creatinine Clearance: 3 mL/min (A) (by C-G formula based on SCr of 14.5 mg/dL (H)).  Liver Function Tests: No results for input(s): AST, ALT, ALKPHOS, BILITOT, PROT, ALBUMIN in the last 168 hours. No results for input(s): LIPASE, AMYLASE in the last 168 hours. No results for input(s): AMMONIA in the last 168 hours.  Coagulation Profile: No results for input(s): INR, PROTIME in the last 168 hours.  Cardiac Enzymes: No results for input(s): CKTOTAL, CKMB, CKMBINDEX, TROPONINI in the last 168 hours.  BNP (last 3 results) No results for input(s):  PROBNP in the last 8760 hours.  HbA1C: No results for input(s): HGBA1C in the last 72 hours.  CBG: Recent Labs  Lab 12/21/17 1028 12/21/17 1045  GLUCAP 57* 119*    Lipid Profile: No results for input(s): CHOL, HDL, LDLCALC, TRIG, CHOLHDL, LDLDIRECT in the last 72 hours.  Thyroid Function Tests: No results for input(s): TSH, T4TOTAL, FREET4, T3FREE, THYROIDAB in the last 72 hours.  Anemia Panel: No results for input(s): VITAMINB12, FOLATE, FERRITIN, TIBC, IRON, RETICCTPCT in the last 72 hours.  Urine analysis:    Component Value Date/Time   COLORURINE YELLOW 12/30/2014 1419   APPEARANCEUR CLEAR 12/30/2014 1419   LABSPEC 1.009 12/30/2014 1419   PHURINE 7.5 12/30/2014 1419   GLUCOSEU NEGATIVE 12/30/2014 1419   HGBUR NEGATIVE 12/30/2014 1419   HGBUR negative 03/23/2007 1112   BILIRUBINUR NEGATIVE 12/30/2014 1419   KETONESUR NEGATIVE 12/30/2014 1419   PROTEINUR 100 (A) 12/30/2014 1419   UROBILINOGEN 0.2 12/30/2014 1419   NITRITE NEGATIVE 12/30/2014 1419   LEUKOCYTESUR NEGATIVE 12/30/2014 1419    Sepsis Labs: @LABRCNTIP (procalcitonin:4,lacticidven:4) )No results found for this or any previous visit (from the past 240 hour(s)).   Radiological Exams on Admission: Dg Chest Port 1 View  Result Date: 12/21/2017 CLINICAL DATA:  78 year old female with acute chest pain today. EXAM: PORTABLE CHEST 1 VIEW COMPARISON:  08/20/2017 and  earlier. FINDINGS: Portable AP semi upright view at 0743 hours. Left side HERO graft hemodialysis access redemonstrated. Visible portions appear stable. Chronic elevation of the right hemidiaphragm is stable. Stable mild cardiomegaly and mediastinal contours. No pneumothorax, pulmonary edema, pleural effusion or consolidation. Mild bibasilar atelectasis appears stable. Paucity of bowel gas in the upper abdomen. Visualized tracheal air column is within normal limits. No acute osseous abnormality identified. IMPRESSION: Chronic bibasilar atelectasis. No acute cardiopulmonary abnormality. Electronically Signed   By: Genevie Ann M.D.   On: 12/21/2017 08:04    EKG: Independently reviewed.  Assessment/Plan Principal Problem:   Right leg pain Active Problems:   Hyperkalemia   HYPERCHOLESTEROLEMIA   Gout, unspecified   ANEMIA NEC   Anxiety and depression   TOBACCO ABUSE   Major depression in partial remission (HCC)   Hypertensive renal disease, malignant, with renal failure   GASTROESOPHAGEAL REFLUX, NO ESOPHAGITIS   Secondary renal hyperparathyroidism (HCC)   Hereditary and idiopathic peripheral neuropathy   CHF (congestive heart failure) (HCC)   Hypertension   Herpes   ESRD on dialysis (HCC)   Chronic obstructive pulmonary disease (HCC)   Left bundle branch block   PAD (peripheral artery disease) (HCC)     Right lower extremity pain, unclear etiology.  History of peripheral vascular disease-peripheral arterial disease.  Patient had recentstatus post right superficial femoral artery stenting by Dr. Lucky Cowboy on 12/17/2017. Pulses are normal on the right, on the left no palpable pedal pulses but the Dopplers are positive .  No evidence of hematoma in the left groin.  No other areas of hematoma noted in the legs.  Patient has a history of chronic neuropathy.  Vascular consultation was obtained, but at this time the etiology is unknown, without evidence of active vascular issue.  Lower extremity venous  Dopplers pending Telemetry observation Follow results of lower extremity Dopplers Pain control Continue Lyrica, at this time she is on 100 mg at bedtime, may consider increasing the dose Continue Plavix   Acute hyperkalemia, in the setting of missing dialysis.  Patient also on Cozaar.  Hyperkalemia protocol has been followed in the ER,  has been given albuterol  nebulizer, insulin D50, and calcium gluconate EKG shows sinus rhythm without EKG changes.  Troponin is negative.  Had one episode of mild chest pain, but currently she is chest pain-free.  Repeat EKG and troponin as indicated Telemetry Hold Cozaar Patient is for dialysis today, anticipate that the values will improve. Appreciate nephrology involvement   Hypertension BP   117/41   Pulse 67    Continue home anti-hypertensive medications after HD BP able to tolerate   Hyperlipidemia Continue home statins   ESRD on HD MWF  Cr 14.5,   Nephrology involved, Dr.Schertz notified, appreciate follow up Renal Diet. Other plans as per Nephrology Follow BMET   Chronic  diastolic CHF CXR NAD  Last 2D echo February 2019 shows EF 55 to 63%, grade 2 diastolic dysfunction, systolic function normal.  She did have JVD on exam, but this may be related to fluid overload  Telemetry obs  monitor I/Os and daily weights prn 02  Restless leg syndrome Peripheral neuropathy Continue Lyrica, as mentioned above this may need to be increased for improvement of her pain control  Gout  Continue Allopurinol  Leukocytosis, likely reactive . WBC  12.4 CXR NAD, afebrile    Repeat CBC in AM No antibiotics indicated at this time   IBS, no acute  issues Continue Robinul   GERD, no acute symptoms Continue PPI   Depression Continue home  Cymbalta   DVT prophylaxis: Heparin subcu Code Status:    Full code Family Communication:  Discussed with patient Disposition Plan: Expect patient to be discharged to home after condition improves Consults  called:    Vascular surgery, Dr. Donnetta Hutching by EDP Admission status: Telemetry observation   Sharene Butters, PA-C [facility]   Amion text  [phone]   12/21/2017, 11:07 AM

## 2017-12-21 NOTE — ED Notes (Signed)
Unable to palpate pedal pulse, found with doppler on bilateral feet.  Unable to obtain IV access at this time, attempt x2. IV team consulted.

## 2017-12-21 NOTE — ED Notes (Signed)
PA bedside for admission evaluation

## 2017-12-21 NOTE — ED Notes (Signed)
MD bedside attempting ultrasound IV

## 2017-12-21 NOTE — ED Notes (Signed)
No deformity noted, pt foot is warm to touch but no increased swelling notice in right leg at this time.

## 2017-12-21 NOTE — Progress Notes (Signed)
New Admission Note:   Arrival Method: Stretcher Mental Orientation: A&O X4 Telemetry: Placed on patient.  Assessment: Completed Skin: See Flowsheets IV: WDL Pain: Denies Safety Measures: Safety Fall Prevention Plan has been given, discussed and signed Admission: Completed Unit Orientation: Patient has been orientated to the room, unit and staff.   Orders have been reviewed and implemented. Will continue to monitor the patient. Call light has been placed within reach and bed alarm has been activated.    Dixie Dials RN, BSN

## 2017-12-21 NOTE — ED Notes (Signed)
Lab results was reported to Nurse Jinny Blossom.

## 2017-12-21 NOTE — ED Triage Notes (Signed)
Pt coming from home by ems due to right leg pain with a pain leve 10/10. Pt had a procedure to put stents in place in her groin area on Thursday and has been having pain in that right leg ever since. Pt has not had dialysis since last Monday due to this procedure. Pt states that she could not take the pain and pain has gotten worse over the last few days.

## 2017-12-21 NOTE — ED Provider Notes (Signed)
[facility] EMERGENCY DEPARTMENT Provider Note   CSN: [id number] Arrival date & time: 12/21/17  0645     History   Chief Complaint Chief Complaint  Patient presents with  . Leg Pain    HPI [name] is a 77 y.o. female.  Patient is a 77 year old female with multiple medical problems including end-stage renal disease on dialysis Monday Wednesday Friday, CHF, COPD, peripheral artery disease with recent stent placement on Thursday who is presenting with severe leg pain.  She states that on Thursday after placing the stents that evening she had significant pain in her right leg all the way down.  She did take a tramadol but it did not seem to help much.  She was unable to stand or get in a wheelchair on Friday so was unable to go to dialysis.  She continued to take the pain medication.  She describes it as a sharp stabbing terrible pain which is constant but then she gets occasional bouts of pain as well.  On Saturday the pain had improved some to where she was able to stand and sit in a wheelchair.  Then on Sunday the pain had improved more and she was able to be a little more mobile.  But then last night the pain returned and was severe.  She had a tramadol around 3 AM and at the same time states when the pain was severe she started having some chest discomfort.  She describes it as an ache similar to the sensation she feels when she needs dialysis.  She was due for dialysis today but states the pain continued to be severe throughout the rest of the night and now is hurting in the left leg as well.  She was unable to get up into a wheelchair and did not feel like she would be able to sit in a chair at dialysis.  She is still having some chest discomfort currently but denies shortness of breath.  She has no abdominal pain, nausea or vomiting.  She has no fever.  The history is provided by the patient.  Leg Pain   This is a new problem. Episode onset: since thursday since  having a stent placed. The problem occurs constantly. The problem has been rapidly worsening. The pain is present in the left upper leg and right upper leg. The quality of the pain is described as pounding, sharp and constant. The pain is at a severity of 10/10. The pain is severe. Associated symptoms include limited range of motion. Pertinent negatives include no numbness. The symptoms are aggravated by activity and standing. Treatments tried: tramadol. The treatment provided no relief.    Past Medical History:  Diagnosis Date  . Abnormality of gait 03/28/2015  . Adrenal mass ([place])   . 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 Arthritis   . Back pain   . CHF (congestive heart failure) ([place])   . Chronic kidney disease    H[place] MWF  . Congestion of throat    Pt states she has a lot mucus in back throat.  . Constipation   . COPD (chronic obstructive pulmonary disease) ([place])   . Depression   . Dialysis patient [facility])    kidney  . Diverticulitis   . GERD (gastroesophageal reflux disease)   . H/O hiatal hernia   . High cholesterol   . Hoarseness of voice   . Hyperlipidemia   . Hypertension   .  IBS (irritable bowel syndrome)   . Meralgia paresthetica of right side 12/26/2014  . Normal cardiac stress test 12/24/2009   lexiscan, imaging normal  . Pneumonia    [id number]  . Renal disorder   . RLS (restless legs syndrome)   . Seizures ([place])    2004 past brain surgery  . Sinus complaint   . Stroke ([place])    TIAs per patient 2 or 3  . Thyroid disease   . TIA (transient ischemic attack)   . Tubular adenoma of colon 01/2008    Patient Active Problem List   Diagnosis Date Noted  . Hyperlipidemia 12/01/2017  . Atherosclerosis of native arteries of extremity with rest pain ([place]) 12/01/2017  . LLQ abdominal pain 08/10/2017  . Left bundle branch block 07/24/2017  . Chest pain 07/24/2017  . Weakness 12/23/2016  . Generalized weakness 12/22/2016  . Elevated  troponin 12/06/2016  . HCAP (healthcare-associated pneumonia) 12/06/2016  . Hypokalemia 12/06/2016  . Chronic systolic CHF (congestive heart failure) ([place]) 12/06/2016  . Osteopenia 09/08/2016  . Allergic rhinitis 08/07/2016  . BMI 32.0-32.9,adult 08/07/2016  . Onychomycosis 05/09/2016  . Nonspecific chest pain 04/19/2016  . ACS (acute coronary syndrome) ([place]) 04/19/2016  . Leg pain, bilateral 12/05/2015  . Trouble in sleeping 11/22/2015  . End stage renal disease ([place])   . Hyperkalemia 11/12/2015  . Acute on chronic respiratory failure with hypoxia ([place]) 11/12/2015  . ESRD on dialysis ([place])   . Chronic obstructive pulmonary disease ([place])   . Hypertensive urgency 09/20/2015  . Fecal incontinence   . Adverse effects of medication 06/21/2015  . Herpes 06/19/2015  . Skin lesion 06/16/2015  . Recurrent genital herpes 06/16/2015  . COPD exacerbation ([place]) 06/05/2015  . Onychocryptosis 04/08/2015  . Unstable gait 03/28/2015  . Renal mass   . Hypertension   . Inadequate pain control 12/30/2014  . CHF (congestive heart failure) ([place]) 12/30/2014  . Meralgia paresthetica of right side 12/26/2014  . Hereditary and idiopathic peripheral neuropathy 02/15/2013  . Dermatitis of face 08/19/2012  . HIP PAIN, BILATERAL 12/07/2008  . BLADDER PROLAPSE 11/17/2007  . OSTEOARTHRITIS, GENERALIZED, MULTIPLE JOINTS 08/18/2007  . 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 Anxiety and depression 04/23/2007  . ANEMIA NEC 03/31/2007  . TOBACCO ABUSE 12/10/2006  . HIATAL HERNIA 12/10/2006  . Dysphagia 12/10/2006  . HYPERCHOLESTEROLEMIA 07/30/2006  . Gout, unspecified 07/30/2006  . Major depression in partial remission ([place]) 07/30/2006  . Hypertensive renal disease, malignant, with renal failure 07/30/2006  . GASTROESOPHAGEAL REFLUX, NO ESOPHAGITIS 07/30/2006    Past Surgical History:  Procedure Laterality Date  . A/V SHUNT INTERVENTION N/A 08/13/2017   Procedure: A/V SHUNT  INTERVENTION;  Surgeon: Algernon Huxley, MD;  Location: [facility];  Service: Cardiovascular;  Laterality: N/A;  . A/V SHUNTOGRAM Left 08/13/2017   Procedure: A/V SHUNTOGRAM;  Surgeon: Algernon Huxley, MD;  Location: [facility];  Service: Cardiovascular;  Laterality: Left;  . ABDOMINAL HYSTERECTOMY    . ANAL RECTAL MANOMETRY N/A 07/25/2015   Procedure: ANO RECTAL MANOMETRY;  Surgeon: Mauri Pole, MD;  Location: [facility];  Service: Endoscopy;  Laterality: N/A;  . APPENDECTOMY    . AV FISTULA PLACEMENT Left 11/11/2012   Procedure: INSERTION OF ARTERIOVENOUS (AV) GORE-TEX GRAFT ARM;  Surgeon: Angelia Mould, MD;  Location: [facility];  Service: Vascular;  Laterality: Left;  . [place] REMOVAL Left 11/18/2012   Procedure: REMOVAL OF LEFT UPPER ARM ARTERIOVENOUS GORETEX GRAFT ([place]);  Surgeon: Angelia Mould,  MD;  Location: [facility];  Service: Vascular;  Laterality: Left;  . CARDIAC CATHETERIZATION  2003   normal  . CHOLECYSTECTOMY     Open mid line incision  . frontal craniotomy  2002   indication = sinusitis  . INSERTION OF DIALYSIS CATHETER     Left  . LOWER EXTREMITY ANGIOGRAPHY Right 12/17/2017   Procedure: LOWER EXTREMITY ANGIOGRAPHY;  Surgeon: Algernon Huxley, MD;  Location: [facility];  Service: Cardiovascular;  Laterality: Right;  . NASAL SINUS SURGERY Bilateral 2002  . PATCH ANGIOPLASTY Left 11/18/2012   Procedure: PATCH ANGIOPLASTY;  Surgeon: Angelia Mould, MD;  Location: [facility];  Service: Vascular;  Laterality: Left;  . PERIPHERAL VASCULAR CATHETERIZATION Left 03/08/2015   Procedure: A/V Shuntogram/Fistulagram;  Surgeon: Algernon Huxley, MD;  Location: [facility];  Service: Cardiovascular;  Laterality: Left;  . PERIPHERAL VASCULAR CATHETERIZATION Left 03/08/2015   Procedure: A/V Shunt Intervention;  Surgeon: Algernon Huxley, MD;  Location: [facility];  Service: Cardiovascular;  Laterality: Left;  . RECTAL ULTRASOUND N/A 10/05/2015    Procedure: ANAL ULTRASOUND WITH PROBE;  Surgeon: Leighton Ruff, MD;  [facility];  Service: Endoscopy;  Laterality: N/A;  . REVISON OF ARTERIOVENOUS FISTULA  05/11/2012   Procedure: REVISON OF ARTERIOVENOUS FISTULA;  Surgeon: Elam Dutch, MD;  Location: [facility];  Service: Vascular;  Laterality: Right;  . TUBAL LIGATION       OB History    Gravida  5   Para  3   Term  3   Preterm      AB      Living        SAB      TAB      Ectopic      Multiple      Live Births               Home Medications    Prior to Admission medications   Medication Sig Start Date End Date Taking? Authorizing Provider  albuterol (PROVENTIL HFA;VENTOLIN HFA) 108 (90 Base) MCG/ACT inhaler Inhale 2 puffs into the lungs every 4 (four) hours as needed for wheezing or shortness of breath. 03/11/17   [place], Betty G, MD  allopurinol (ZYLOPRIM) 100 MG tablet Take 100 mg by mouth once daily on non-dialysis days (Tues/Thurs/Sat/Sun) Patient taking differently: Take 100 mg by mouth See admin instructions. Take one tablet (100 mg) by mouth once daily on non-dialysis days (Tues/Thurs/Sat/Sun) 11/13/15   Smiley Houseman, MD  amLODipine (NORVASC) 10 MG tablet TAKE 1 TABLET BY MOUTH AT BEDTIME Patient taking differently: TAKE 1 TABLET (10 MG) BY MOUTH AT BEDTIME 06/01/17   [place], Betty G, MD  aspirin EC 81 MG tablet Take 81 mg by mouth daily.     [provider]  atorvastatin (LIPITOR) 40 MG tablet Take 1 tablet (40 mg total) by mouth daily. Patient taking differently: Take 40 mg by mouth at bedtime.  02/26/15   Rumley, Osburn N, DO  azelastine (ASTELIN) 0.1 % nasal spray Place 1 spray into both nostrils 2 (two) times daily. Use in each nostril as directed Patient taking differently: Place 1 spray into both nostrils 2 (two) times daily as needed. Use in each nostril as directed 08/07/16   [place], Betty G, MD  B Complex-C-Zn-Folic Acid (DIALYVITE/ZINC) TABS Take 1 tablet by mouth daily.  09/25/17   [provider]  budesonide-formoterol (SYMBICORT) 160-4.5 MCG/ACT inhaler Inhale 2 puffs into the lungs 2 (two)  times daily. 07/26/17   Dana Allan I, MD  cinacalcet (SENSIPAR) 60 MG tablet Take 60 mg by mouth daily.    [provider]  cinacalcet (SENSIPAR) 90 MG tablet Take 90 mg by mouth at bedtime.    [provider]  cloNIDine (CATAPRES) 0.2 MG tablet  10/08/17   [provider]  clopidogrel (PLAVIX) 75 MG tablet Take 1 tablet (75 mg total) by mouth daily. 12/17/17   Algernon Huxley, MD  dicyclomine (BENTYL) 10 MG capsule Take 1 capsule (10 mg total) by mouth 4 (four) times daily -  before meals and at bedtime. 08/10/17   Zehr, Laban Emperor, PA-C  DULoxetine (CYMBALTA) 30 MG capsule Take 2 capsules (60 mg total) by mouth daily. Patient taking differently: Take 30 mg by mouth at bedtime.  01/08/17   Kathrynn Ducking, MD  fluticasone [facility]) 50 MCG/ACT nasal spray Place 2 sprays into both nostrils daily as needed for allergies or rhinitis. 10/29/17   [place], Betty G, MD  glycopyrrolate (ROBINUL) 2 MG tablet Take 1 tablet (2 mg total) by mouth 2 (two) times daily. 08/25/17   Ladene Artist, MD  hydrOXYzine (ATARAX/VISTARIL) 25 MG tablet Take 1 tablet (25 mg total) by mouth every 8 (eight) hours as needed for itching. 09/16/17   [place], Betty G, MD  lidocaine-prilocaine (EMLA) cream Apply 1 application topically See admin instructions. Apply topically one hour before dialysis - Monday, Wednesday, Friday    [provider]  loratadine (CLARITIN) 10 MG tablet 10 mg every other day. 10/29/17   [place], Betty G, MD  losartan (COZAAR) 100 MG tablet Take 1 tablet (100 mg total) by mouth daily. 10/29/17   [place], Betty G, MD  metoprolol succinate (TOPROL-XL) 100 MG 24 hr tablet TAKE 1 TABLET (100 MG)  BY MOUTH TWICE DAILY 10/13/17   [place], Betty G, MD  Multiple Vitamin (MULTIVITAMIN) tablet Take 1 tablet by mouth daily.    [provider]    multivitamin (RENA-VIT) TABS tablet Take 1 tablet by mouth at bedtime. Patient taking differently: Take 1 tablet by mouth daily.  01/01/15   Haney, Yetta Flock A, MD  nystatin (MYCOSTATIN) 100000 UNIT/ML suspension TAKE 5 MLS BY MOUTH 4 TIMES A DAY SWISH AND SWALLOW 08/31/17   [provider]  pantoprazole (PROTONIX) 20 MG tablet Take 1 tablet (20 mg total) by mouth 2 (two) times daily before a meal. 09/08/17   [place], Betty G, MD  Pramoxine-Benzyl Alcohol (ITCH-X) 1-10 % GEL Apply 1 application topically 3 (three) times daily as needed. 09/16/17   [place], Betty G, MD  pregabalin (LYRICA) 100 MG capsule Take 100 mg by mouth at bedtime.     [provider]  sevelamer carbonate (RENVELA) 800 MG tablet Take 2,400 mg by mouth See admin instructions. Take 3 tablets (2400 mg) by mouth with each meal or snack - once or twice daily    [provider]  TRAMADOL HCL PO Take by mouth as needed.    [provider]    Family History Family History  Problem Relation Age of Onset  . Thyroid cancer Mother   . Heart disease Father   . Hypertension Father   . Heart attack Father   . Lupus Daughter   . Breast cancer Sister   . Thyroid cancer Sister   . Kidney disease Sister   . Colon cancer Neg Hx     Social History Social History   Tobacco Use  . Smoking status: Current Every  Day Smoker    Packs/day: 1.00    Years: 60.00    Pack years: 60.00    Types: Cigarettes  . Smokeless tobacco: Never Used  . Tobacco comment: smoker since 77 yo.  1.5 ppd of [place] 100 lights. ; form given 10-23-16  Substance Use Topics  . Alcohol use: No    Alcohol/week: 0.0 oz  . Drug use: No    Comment: No hx of IV drug use     Allergies   Tuberculin tests; Valacyclovir; Codeine; Penicillins; and Sulfamethoxazole   Review of Systems Review of Systems  Neurological: Negative for numbness.  All other systems reviewed and are negative.    Physical Exam Updated Vital Signs BP (!)  128/54   Pulse 64   Temp 97.7 F (36.5 C) (Oral)   Resp 15   Ht 5\' 1"  (1.549 m)   Wt 71.2 kg (157 lb)   SpO2 93%   BMI 29.66 kg/m   Physical Exam  Constitutional: She is oriented to person, place, and time. She appears well-developed and well-nourished. She appears distressed.  Appears to be in pain  HENT:  Head: Normocephalic and atraumatic.  Mouth/Throat: Oropharynx is clear and moist.  Eyes: Pupils are equal, round, and reactive to light. Conjunctivae and EOM are normal.  Neck: Normal range of motion. Neck supple.  Cardiovascular: Normal rate and regular rhythm.  No murmur heard. Pulmonary/Chest: Effort normal and breath sounds normal. No respiratory distress. She has no wheezes. She has no rales.  Abdominal: Soft. She exhibits no distension. There is no tenderness. There is no rebound and no guarding.  Musculoskeletal: She exhibits tenderness. She exhibits no edema.  Mild swelling noted to the entire upper part of right lower extremity.  Exquisitely tender to touch even with light touch over the entire right lower leg.  Leg is warm throughout.  1-2+ pitting edema in the right foot.  Left leg is mildly tender to palpation and warm throughout with no swelling noted.  Catheter site in the left groin is clean, mild ecchymosis but no drainage.  Unable to palpate DP pulses manually but will Doppler.  Neurological: She is alert and oriented to person, place, and time.  Skin: Skin is warm and dry. No rash noted. No erythema.  Psychiatric: She has a normal mood and affect. Her behavior is normal.  Nursing note and vitals reviewed.    ED Treatments / Results  Labs (all labs ordered are listed, but only abnormal results are displayed) Labs Reviewed  CBC WITH DIFFERENTIAL/PLATELET - Abnormal; Notable for the following components:      Result Value   WBC 12.4 (*)    RBC 3.73 (*)    Hemoglobin 10.8 (*)    HCT 33.7 (*)    RDW 16.5 (*)    Neutro Abs 9.3 (*)    Monocytes Absolute 1.4  (*)    All other components within normal limits  I-STAT CHEM 8, ED - Abnormal; Notable for the following components:   Sodium 130 (*)    Potassium 7.0 (*)    BUN 58 (*)    Creatinine, Ser 14.50 (*)    Glucose, Bld 69 (*)    Calcium, Ion 0.84 (*)    Hemoglobin 11.2 (*)    HCT 33.0 (*)    All other components within normal limits  POTASSIUM  I-STAT TROPONIN, ED    EKG EKG Interpretation  Date/Time:  Monday December 21 2017 08:02:14 EDT Ventricular Rate:  62 PR Interval:  QRS Duration: 109 QT Interval:  453 QTC Calculation: 460 R Axis:   34 Text Interpretation:  Sinus rhythm Prolonged PR interval Incomplete left bundle branch block Consider anterior infarct No significant change since last tracing Confirmed by Blanchie Dessert [phone]) on 12/21/2017 8:16:30 AM   Radiology Dg Chest Port 1 View  Result Date: 12/21/2017 CLINICAL DATA:  77 year old female with acute chest pain today. EXAM: PORTABLE CHEST 1 VIEW COMPARISON:  08/20/2017 and earlier. FINDINGS: Portable AP semi upright view at 0743 hours. Left side HERO graft hemodialysis access redemonstrated. Visible portions appear stable. Chronic elevation of the right hemidiaphragm is stable. Stable mild cardiomegaly and mediastinal contours. No pneumothorax, pulmonary edema, pleural effusion or consolidation. Mild bibasilar atelectasis appears stable. Paucity of bowel gas in the upper abdomen. Visualized tracheal air column is within normal limits. No acute osseous abnormality identified. IMPRESSION: Chronic bibasilar atelectasis. No acute cardiopulmonary abnormality. Electronically Signed   By: Genevie Ann M.D.   On: 12/21/2017 08:04    Procedures Procedures (including critical care time)  Medications Ordered in ED Medications  fentaNYL (SUBLIMAZE) injection 25 mcg (has no administration in time range)  ondansetron (ZOFRAN) injection 4 mg (has no administration in time range)     Initial Impression / Assessment and Plan / ED Course   I have reviewed the triage vital signs and the nursing notes.  Pertinent labs & imaging results that were available during my care of the patient were reviewed by me and considered in my medical decision making (see chart for details).    Elderly female with multiple medical problems presenting today with severe pain in her right leg that has worsened in the last 12 hours.  Recently had stents placed on Thursday for PAD.  Patient has been taking tramadol at home and initially symptoms were improving but now have worsened.  On exam she is very tender to light touch throughout the right lower extremity.  She does have edema in her right lower extremity which reportedly by patient is new.  Bilateral feet are warm with 2 to 3-second capillary refill.  Pulses are not palpated but doppler has pulse bilaterally however the extremities are warm and appear to have blood flow.  Patient has no abdominal pain.  However patient also started to experience chest discomfort around 2-3 o'clock this morning.  She states that it is only mild and she felt that it was related to not having dialysis for almost a week.  She did not dialyze Friday because her pain was so severe she could not find ambulance transport to the unit to be dialyzed.  She denies significant shortness of breath and just describes a chest tightness. Concern for possible volume overload, hyperkalemia as result of not having dialysis.  Also concern for potential complications with recent stent placement.  Patient was given pain control.  CBC, Chem-8, troponin and EKG pending.  Chest x-ray pending.  We will also discuss with vascular surgery.  10:15 AM Patient's labs returned with a normal troponin but a potassium of 7.  Also blood sugar of 69 but patient is not diabetic.  She has not eaten today but will feed the patient.  CBC with mild leukocytosis of 12.  Patient does not have acute EKG changes and chest x-ray is within normal limits.  Dr. Donnetta Hutching with  vascular surgery came to see the patient and it is not clear at this time why she is having such significant pain.  Will treat pain with pain medication at this  time.  Also spoke with Dr. Melvia Heaps and they will come see the patient for dialysis.  Will admit for ongoing care of her leg pain and hyperkalemia. Hyperkalemia treated with albuterol neb, insulin, D50, calcium gluconate.  11:25 AM Will aslo get doppler to r/o dvt.  Pt to be admitted to hospitalist  CRITICAL CARE Performed by: Shamia Uppal Total critical care time: 30 minutes Critical care time was exclusive of separately billable procedures and treating other patients. Critical care was necessary to treat or prevent imminent or life-threatening deterioration. Critical care was time spent personally by me on the following activities: development of treatment plan with patient and/or surrogate as well as nursing, discussions with consultants, evaluation of patient's response to treatment, examination of patient, obtaining history from patient or surrogate, ordering and performing treatments and interventions, ordering and review of laboratory studies, ordering and review of radiographic studies, pulse oximetry and re-evaluation of patient's condition.   Final Clinical Impressions(s) / ED Diagnoses   Final diagnoses:  Right leg pain  Hyperkalemia  ESRD (end stage renal disease) [facility])    ED Discharge Orders    None       Blanchie Dessert, MD 12/21/17 1125

## 2017-12-22 ENCOUNTER — Observation Stay (HOSPITAL_BASED_OUTPATIENT_CLINIC_OR_DEPARTMENT_OTHER): Payer: Medicare Other

## 2017-12-22 DIAGNOSIS — M1000 Idiopathic gout, unspecified site: Secondary | ICD-10-CM | POA: Diagnosis not present

## 2017-12-22 DIAGNOSIS — K210 Gastro-esophageal reflux disease with esophagitis: Secondary | ICD-10-CM | POA: Diagnosis not present

## 2017-12-22 DIAGNOSIS — F329 Major depressive disorder, single episode, unspecified: Secondary | ICD-10-CM

## 2017-12-22 DIAGNOSIS — M79609 Pain in unspecified limb: Secondary | ICD-10-CM | POA: Diagnosis not present

## 2017-12-22 DIAGNOSIS — I5032 Chronic diastolic (congestive) heart failure: Secondary | ICD-10-CM

## 2017-12-22 DIAGNOSIS — I10 Essential (primary) hypertension: Secondary | ICD-10-CM

## 2017-12-22 DIAGNOSIS — M79604 Pain in right leg: Secondary | ICD-10-CM | POA: Diagnosis not present

## 2017-12-22 DIAGNOSIS — F419 Anxiety disorder, unspecified: Secondary | ICD-10-CM

## 2017-12-22 LAB — BASIC METABOLIC PANEL
Anion gap: 13 (ref 5–15)
BUN: 20 mg/dL (ref 8–23)
CO2: 28 mmol/L (ref 22–32)
CREATININE: 7.02 mg/dL — AB (ref 0.44–1.00)
Calcium: 8.1 mg/dL — ABNORMAL LOW (ref 8.9–10.3)
Chloride: 97 mmol/L — ABNORMAL LOW (ref 98–111)
GFR calc Af Amer: 6 mL/min — ABNORMAL LOW (ref >60)
GFR, EST NON AFRICAN AMERICAN: 5 mL/min — AB (ref >60)
Glucose, Bld: 74 mg/dL (ref 70–99)
POTASSIUM: 4.6 mmol/L (ref 3.5–5.1)
SODIUM: 138 mmol/L (ref 135–145)

## 2017-12-22 LAB — CBC
HCT: 34.5 % — ABNORMAL LOW (ref 36.0–46.0)
Hemoglobin: 10.8 g/dL — ABNORMAL LOW (ref 12.0–15.0)
MCH: 28.1 pg (ref 26.0–34.0)
MCHC: 31.3 g/dL (ref 30.0–36.0)
MCV: 89.8 fL (ref 78.0–100.0)
PLATELETS: 225 10*3/uL (ref 150–400)
RBC: 3.84 MIL/uL — ABNORMAL LOW (ref 3.87–5.11)
RDW: 16.4 % — AB (ref 11.5–15.5)
WBC: 11.4 10*3/uL — AB (ref 4.0–10.5)

## 2017-12-22 MED ORDER — PREGABALIN 100 MG PO CAPS: 100.0000 mg | ORAL_CAPSULE | Freq: Two times a day (BID) | ORAL | Status: DC

## 2017-12-22 MED ORDER — HYDROMORPHONE HCL 1 MG/ML IJ SOLN
0.5000 mg | INTRAMUSCULAR | Status: DC | PRN
  Administered 2017-12-22 – 2017-12-23 (×5): 0.5 mg via INTRAVENOUS
  Filled 2017-12-22 (×4): qty 1

## 2017-12-22 MED ORDER — PREGABALIN 100 MG PO CAPS
100.0000 mg | ORAL_CAPSULE | Freq: Every day | ORAL | Status: DC
  Administered 2017-12-22 – 2017-12-26 (×5): 100 mg via ORAL
  Filled 2017-12-22 (×6): qty 1

## 2017-12-22 MED ORDER — CHLORHEXIDINE GLUCONATE CLOTH 2 % EX PADS: 6.0000 | MEDICATED_PAD | Freq: Every day | CUTANEOUS | Status: DC

## 2017-12-22 NOTE — Progress Notes (Addendum)
Preliminary notes--Bilateral lower extremities venous duplex exam completed.  Questionable acute positive DVT involving right femoral vein and popliteal vein. However, exam limited due to patient is very sensitive to compression. Hongying Willard Farquharson (RDMS RVT) 12/22/17 11:07 AM

## 2017-12-22 NOTE — Progress Notes (Signed)
PT Cancellation Note  Patient Details Name: [name] MRN: [id number] DOB: [birth year]   Cancelled Treatment:    Reason Eval/Treat Not Completed: Active bedrest order   Will need incr activity orders to proceed with PT evaluation;   [name], [place] Pager [phone] Office [phone]    [name] 12/22/2017, 8:15 AM

## 2017-12-22 NOTE — Progress Notes (Signed)
PT Cancellation Note  Patient Details Name: [name] MRN: [id number] DOB: 1941-01-18   Cancelled Treatment:    Reason Eval/Treat Not Completed: Other (comment)   Noted results of LE venous duplex exam showing "Questionable acute positive DVT involving right femoral vein and popliteal vein";  Will hold PT/mobility until decisions re: anticoagulation are made and implemented;   [name], [place] Pager [phone] Office [phone]      [name] 12/22/2017, 11:48 AM

## 2017-12-22 NOTE — Progress Notes (Signed)
Initial Nutrition Assessment  DOCUMENTATION CODES:   Not applicable  INTERVENTION:   Nepro Shake po BID, each supplement provides 425 kcal and 19 grams protein  Continue Renal MVI  NUTRITION DIAGNOSIS:   Increased nutrient needs related to chronic illness as evidenced by estimated needs.  GOAL:   Patient will meet greater than or equal to 90% of their needs   MONITOR:   PO intake, Supplement acceptance, Labs, Weight trends  REASON FOR ASSESSMENT:   Malnutrition Screening Tool    ASSESSMENT:   77 yo female admitted  RLE pain, hyperkalemia (not attending all of HD treatments due to pain (last HD Friday). Pt with hx of ESRD on HD (started on 03/26/12), seizure disorder, depression, PTSD, PAD, COPD, HTN, CHF, IBS. Pt recently underwent right LE angioplasty due to PAD with RLE pain on 12/17/17  On visit today, pt in pain. States "I don't want to talk about food right now."  Recorded po intake 85-100% of meals  Outpatient EDW 71 kg, current wt 69 kg. Noted if pt remains under dry wt, plan to adjust at dischrnge  Renvela 2400 mg with meals as outpatient, on Renal MVI as well. Follow-up as appropriate and reinforce binder adherence and phosphorus restriction given hyperphosphatemia  Labs: phosphorus 7.7 (H), potassium 7.4 (H) down to 4.6 today; corrected calcium 9.4 (serum calcium 8.2, albumin 2.5) Meds: sensipar 90 mg,  Hectoral, rena-vit, renvela  NUTRITION - FOCUSED PHYSICAL EXAM:  Pt declined at this time  Diet Order:   Diet Order           Diet renal with fluid restriction Fluid restriction: 1200 mL Fluid; Room service appropriate? Yes; Fluid consistency: Thin  Diet effective now          EDUCATION NEEDS:   Not appropriate for education at this time  Skin:  Skin Assessment: Reviewed RN Assessment  Last BM:  no documented BM  Height:   Ht Readings from Last 1 Encounters:  12/21/17 5\' 1"  (1.549 m)    Weight:   Wt Readings from Last 1 Encounters:   12/21/17 152 lb 8.9 oz (69.2 kg)    Ideal Body Weight:     BMI:  Body mass index is 28.83 kg/m.  Estimated Nutritional Needs:   Kcal:  1800-2000 kcals   Protein:  90-100 g  Fluid:  1000 mL plus UOP    [employer] MS, RD, LDN, CNSC [phone] Pager  [phone] Weekend/On-Call Pager

## 2017-12-22 NOTE — Progress Notes (Addendum)
[facility] Progress Note   Subjective:   Continues to have right LE pain.  States she did not tolerate dialysis well yesterday because of the pain.   Objective Vitals:   12/21/17 1728 12/21/17 2012 12/22/17 0541 12/22/17 1143  BP: (!) 139/46 (!) 137/42 (!) 131/46 (!) 141/47  Pulse: 75 69 85 74  Resp: 17 20 16 18   Temp: 98.2 F (36.8 C) 98.9 F (37.2 C) 99.1 F (37.3 C) 98.5 F (36.9 C)  TempSrc: Oral Oral Oral Oral  SpO2: 94% 94% 94% 94%  Weight:  69.2 kg (152 lb 8.9 oz)    Height:       Physical Exam General:NAD, chronically ill appearing female Heart:RRR, +2/2 systolic murmur Lungs:CTAB Abdomen:soft, NTND Extremities:no LE edema, +tenderness palpation R leg down entire length Dialysis Access: LU HeRO +b  Filed Weights   12/21/17 1245 12/21/17 1636 12/21/17 2012  Weight: 71.2 kg (156 lb 15.5 oz) 70.4 kg (155 lb 3.3 oz) 69.2 kg (152 lb 8.9 oz)    Intake/Output Summary (Last 24 hours) at 12/22/2017 1203 Last data filed at 12/22/2017 0631 Gross per 24 hour  Intake 240 ml  Output 3000 ml  Net -2760 ml    Additional Objective Labs: Basic Metabolic Panel: Recent Labs  Lab 12/21/17 0847 12/21/17 1309 12/22/17 0544  NA 130* 136 138  K 7.0* 5.8* 4.6  CL 100 95* 97*  CO2  --  24 28  GLUCOSE 69* 37* 74  BUN 58* 61* 20  CREATININE 14.50* 13.27* 7.02*  CALCIUM  --  8.2* 8.1*  PHOS  --  7.7*  --    Liver Function Tests: Recent Labs  Lab 12/21/17 0840 12/21/17 1309  AST 19  --   ALT 10  --   ALKPHOS 82  --   BILITOT 0.6  --   PROT 7.2  --   ALBUMIN 2.6* 2.5*   CBC: Recent Labs  Lab 12/21/17 0840 12/21/17 0847 12/21/17 1309 12/22/17 0544  WBC 12.4*  --  14.1* 11.4*  NEUTROABS 9.3*  --   --   --   HGB 10.8* 11.2* 10.5* 10.8*  HCT 33.7* 33.0* 32.6* 34.5*  MCV 90.3  --  89.3 89.8  PLT 215  --  257 225   Cardiac Enzymes: Recent Labs  Lab 12/21/17 1309  CKTOTAL 46   CBG: Recent Labs  Lab 12/21/17 1045 12/21/17 1107  12/21/17 1129 12/21/17 1352 12/21/17 1601  GLUCAP 119* 117* 80 70 107*   Iron Studies: No results for input(s): IRON, TIBC, TRANSFERRIN, FERRITIN in the last 72 hours. Lab Results  Component Value Date   INR 1.14 11/14/2013   INR 1.10 04/14/2012   INR 1.14 05/06/2010   Studies/Results: Dg Chest Port 1 View  Result Date: 12/21/2017 CLINICAL DATA:  77 year old female with acute chest pain today. EXAM: PORTABLE CHEST 1 VIEW COMPARISON:  08/20/2017 and earlier. FINDINGS: Portable AP semi upright view at 0743 hours. Left side HERO graft hemodialysis access redemonstrated. Visible portions appear stable. Chronic elevation of the right hemidiaphragm is stable. Stable mild cardiomegaly and mediastinal contours. No pneumothorax, pulmonary edema, pleural effusion or consolidation. Mild bibasilar atelectasis appears stable. Paucity of bowel gas in the upper abdomen. Visualized tracheal air column is within normal limits. No acute osseous abnormality identified. IMPRESSION: Chronic bibasilar atelectasis. No acute cardiopulmonary abnormality. Electronically Signed   By: Genevie Ann M.D.   On: 12/21/2017 08:04    Medications:  . allopurinol  100 mg Oral Once per  day on Sun Tue Thu Sat  . amLODipine  10 mg Oral QHS  . aspirin EC  81 mg Oral Daily  . atorvastatin  40 mg Oral QHS  . Chlorhexidine Gluconate Cloth  6 each Topical Q0600  . cinacalcet  90 mg Oral QHS  . cloNIDine  0.2 mg Oral Daily  . clopidogrel  75 mg Oral Daily  . [START ON 12/23/2017] doxercalciferol  1 mcg Intravenous Q M,W,F-HD  . DULoxetine  60 mg Oral QHS  . feeding supplement (NEPRO CARB STEADY)  237 mL Oral BID BM  . glycopyrrolate  2 mg Oral BID  . heparin  5,000 Units Subcutaneous Q8H  . [START ON 12/23/2017] lidocaine-prilocaine  1 application Topical Once per day on Mon Wed Fri  . metoprolol succinate  100 mg Oral BID  . multivitamin  1 tablet Oral QHS  . pantoprazole  20 mg Oral BID AC  . pregabalin  100 mg Oral QHS  .  sevelamer carbonate  2,400 mg Oral TID WC    Dialysis Orders: MWF - [facility]  3.75hrs, BFR 400, DFR AF1.5,  EDW 71kg, 2K/ 2.25Ca Access: LU HeRO  Heparin 4000 unit bolus Mircera 50 mcg q2wks - last 12/07/17 Hectorol 3mcg IV qHD     Assessment/Plan: 1. RLE pain/?DVT? - Hx PAD w/RLE resting pain, intervention by Dr. Lucky Cowboy on 7/18. bl LE Venous duplex prelim showed questionable acute +DVT in R femoral & popliteal vein.  Study limited by patient pain. Final results pending. Per primary 2. Hyperkalemia  - resolved with HD. K 4.6 today. 3.  ESRD -  HD MWF.  HD completed yesterday with net UF removal of 3L.  Orders written for HD tomorrow per regular schedule.  4.  Hypertension/volume  - BP remains variable. Appears euvolemic on exam. Under EDW post HD. Continue to titrate down as tolerated.  Reassess edw at d/c if continues to fall below dry.   5.  Anemia of CKD - Hgb 10.8. No indication for ESA at this time.  6.  Secondary Hyperparathyroidism -  Ca 8.1, Phos 7.7. Continue binders and VDRA. 7.  Nutrition - Alb 2.5. Renal diet with fluid restrictions. Renavite. Nepro 8. Hx seizure d/o - per primary 9. Hx depression/PTSD - per primary   Jen Mow, PA-C [facility] Pager: [phone] 12/22/2017,12:03 PM  LOS: 0 days   Pt seen, examined and agree w A/P as above.  Kelly Splinter MD [employer] pager [phone]   12/22/2017, 3:01 PM

## 2017-12-22 NOTE — Progress Notes (Signed)
[facility]                                                                              Patient Demographics  [name], is a 77 y.o. female, DOB - August 30, 1940, JTT:[id number]  Admit date - 12/21/2017   Admitting Physician Reyne Dumas, MD  Outpatient Primary MD for the patient is [place], Malka So, MD  Outpatient specialists:   LOS - 0  days   Medical records reviewed and are as summarized below:    Chief Complaint  Patient presents with  . Leg Pain       Brief summary   Patient is a 77 year old female with COPD, hypertension, ESRD on hemodialysis MWF, depression, chronic anemia, IBS, restless leg syndrome, PAD, neuropathy status post right superficial femoral artery stenting by Dr. Lazaro Arms on 12/17/2017 presented with severe right lower extremity pain, sharp, intermittent, progressing since last Thursday 5 days ago.  States tramadol did not help, unable to stand or get a wheelchair hence was unable to go to dialysis due to pain.   Assessment & Plan    Principal Problem:   Right leg pain in the setting of PAD, chronic peripheral neuropathy, recent right superficial femoral artery stenting -No evidence of hematoma, Doppler ultrasound pending to rule out DVT -Currently pain controlled -Vascular surgery consult was obtained, did not feel patient had any evidence of disruption of the artery or hematoma, normal flow to her right foot with recent intervention.  No evidence of active vascular issue.  Outpatient follow-up with Dr. Lucky Cowboy. -Continue Lyrica, Tylenol as needed -Follow Doppler ultrasound to rule out DVT  Active Problems:  ESRD on hemodialysis, MWF, hyperkalemia -Last HD on 7/17, states missed hemodialysis due to pain -Patient received hemodialysis yesterday on 7/22  Mild acute metabolic encephalopathy -Per son at the bedside, patient was confused at home, at times hallucinating, symptoms started after she was taking narcotics for the pain -Improving,  currently alert and oriented, avoid narcotics    HYPERCHOLESTEROLEMIA -Continue statins     Gout, unspecified Continue allopurinol  Anemia of chronic disease/CKD -H&H currently stable     Anxiety and depression -Continue Cymbalta    GASTROESOPHAGEAL REFLUX, NO ESOPHAGITIS -Continue PPI  Diastolic chronic CHF (congestive heart failure) (HCC) -Prior echo in 07/24/2017 showed EF of 55 to 00%, grade 2 diastolic dysfunction -Currently stable, volume management with hemodialysis, received HD on 7/22  Essential hypertension - BP currently stable, continue clonidine, beta-blocker, amlodipine    Chronic obstructive pulmonary disease (HCC) -Stable, no wheezing    PAD (peripheral artery disease) (HCC) -Continue Plavix  Code Status: Full CODE STATUS DVT Prophylaxis: Heparin subcu Family Communication: Discussed in detail with the patient, all imaging results, lab results explained to the patient and son at the bedside   Disposition Plan: PT evaluation, Doppler ultrasound pending, likely DC home in a.m. after HD  Time Spent in minutes   35 minutes  Procedures:  HD Consultants:   Nephrology Vascular surgery  Antimicrobials:      Medications  Scheduled Meds: . allopurinol  100 mg Oral Once per day on Sun Tue Thu Sat  . amLODipine  10 mg  Oral QHS  . aspirin EC  81 mg Oral Daily  . atorvastatin  40 mg Oral QHS  . Chlorhexidine Gluconate Cloth  6 each Topical Q0600  . cinacalcet  90 mg Oral QHS  . cloNIDine  0.2 mg Oral Daily  . clopidogrel  75 mg Oral Daily  . [START ON 12/23/2017] doxercalciferol  1 mcg Intravenous Q M,W,F-HD  . DULoxetine  60 mg Oral QHS  . feeding supplement (NEPRO CARB STEADY)  237 mL Oral BID BM  . glycopyrrolate  2 mg Oral BID  . heparin  5,000 Units Subcutaneous Q8H  . [START ON 12/23/2017] lidocaine-prilocaine  1 application Topical Once per day on Mon Wed Fri  . metoprolol succinate  100 mg Oral BID  . multivitamin  1 tablet Oral QHS  .  pantoprazole  20 mg Oral BID AC  . pregabalin  100 mg Oral QHS  . sevelamer carbonate  2,400 mg Oral TID WC   Continuous Infusions: PRN Meds:.acetaminophen **OR** acetaminophen, azelastine, bisacodyl, hydrOXYzine, ondansetron **OR** ondansetron (ZOFRAN) IV, senna-docusate   Antibiotics   Anti-infectives (From admission, onward)   None        Subjective:   [name] was seen and examined today.  Alert and awake, oriented, son at the bedside states mental status is improving.  Currently no leg pain bilaterally at the time of my examination. Patient denies dizziness, chest pain, shortness of breath, abdominal pain, N/V/D/C  Objective:   Vitals:   12/21/17 1636 12/21/17 1728 12/21/17 2012 12/22/17 0541  BP: (!) 145/54 (!) 139/46 (!) 137/42 (!) 131/46  Pulse: 73 75 69 85  Resp: 16 17 20 16   Temp: 97.8 F (36.6 C) 98.2 F (36.8 C) 98.9 F (37.2 C) 99.1 F (37.3 C)  TempSrc: Oral Oral Oral Oral  SpO2: 95% 94% 94% 94%  Weight: 70.4 kg (155 lb 3.3 oz)  69.2 kg (152 lb 8.9 oz)   Height:        Intake/Output Summary (Last 24 hours) at 12/22/2017 1020 Last data filed at 12/22/2017 0631 Gross per 24 hour  Intake 348.87 ml  Output 3000 ml  Net -2651.13 ml     Wt Readings from Last 3 Encounters:  12/21/17 69.2 kg (152 lb 8.9 oz)  12/17/17 71.2 kg (157 lb)  12/15/17 71.2 kg (157 lb)     Exam  General: Alert and oriented x 3, NAD  Eyes:   HEENT:  Atraumatic, normocephalic  Cardiovascular: S1 S2 auscultated,  Regular rate and rhythm.  Respiratory: Clear to auscultation bilaterally, no wheezing, rales or rhonchi  Gastrointestinal: Soft, nontender, nondistended, + bowel sounds  Ext: no pedal edema bilaterally  Neuro: no new deficits  Musculoskeletal: No digital cyanosis, clubbing  Skin: No rashes  Psych: flat affect, alert and oriented x3     Data Reviewed:  I have personally reviewed following labs and imaging studies  Micro Results Recent Results (from  the past 240 hour(s))  MRSA PCR Screening     Status: None   Collection Time: 12/21/17  6:12 PM  Result Value Ref Range Status   MRSA by PCR NEGATIVE NEGATIVE Final    Comment:        The GeneXpert MRSA Assay (FDA approved for NASAL specimens only), is one component of a comprehensive MRSA colonization surveillance program. It is not intended to diagnose MRSA infection nor to guide or monitor treatment for MRSA infections. Performed at [facility], [place] [address], [place], [place] [address]  Radiology Reports US Arterial Seg Multiple  Result Date: 11/24/2017 CLINICAL DATA:  77 year old female with a history of PAD. Cardiovascular risk factors include smoking, hypertension, hyperlipidemia, known vascular surgery, renal failure EXAM: NONINVASIVE PHYSIOLOGIC VASCULAR STUDY OF BILATERAL LOWER EXTREMITIES TECHNIQUE: Evaluation of both lower extremities was performed at rest, including calculation of ankle-brachial indices, multiple segmental pressure evaluation, segmental Doppler and segmental pulse volume recording. COMPARISON:  No prior duplex FINDINGS: Right: Resting ankle brachial index:  0.37 Segmental blood pressure: Right upper extremity pressure 761 systolic. Decrease to the right thigh. Significant drop to the low thigh. Ankle pressure measures 31 systolic. Doppler: Segmental Doppler of the right lower extremity demonstrates monophasic waveform throughout. Pulse volume recording: Segmental PVR of the right lower extremity demonstrates loss of augmentation below knee with deterioration of the waveform throughout. Left: Resting ankle brachial index: 0.70 Segmental blood pressure: Segmental blood pressure of the left arm was not measured. Asymmetric pressure in the thigh. Significant drop below knee. Doppler: Segmental Doppler of the left lower extremity monophasic throughout. Pulse volume recording: Segmental PVR of the left lower extremity demonstrates waveform amplitude maintained  with no deterioration at the ankle. 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 Segmental exam demonstrates multi segment disease including at least iliac and femoropopliteal disease. Left: Resting ABI in the mild range of arterial occlusive disease. This may be falsely elevated given the appearance of the segmental exam. Segmental exam demonstrates multi segment disease including at least iliac and femoropopliteal disease. Signed, Dulcy Fanny. Dellia Nims, RPVI [facility] Electronically Signed   By: Corrie Mckusick D.O.   On: 11/24/2017 15:03   Dg Chest Port 1 View  Result Date: 12/21/2017 CLINICAL DATA:  77 year old female with acute chest pain today. EXAM: PORTABLE CHEST 1 VIEW COMPARISON:  08/20/2017 and earlier. FINDINGS: Portable AP semi upright view at 0743 hours. Left side HERO graft hemodialysis access redemonstrated. Visible portions appear stable. Chronic elevation of the right hemidiaphragm is stable. Stable mild cardiomegaly and mediastinal contours. No pneumothorax, pulmonary edema, pleural effusion or consolidation. Mild bibasilar atelectasis appears stable. Paucity of bowel gas in the upper abdomen. Visualized tracheal air column is within normal limits. No acute osseous abnormality identified. IMPRESSION: Chronic bibasilar atelectasis. No acute cardiopulmonary abnormality. Electronically Signed   By: Genevie Ann M.D.   On: 12/21/2017 08:04    Lab Data:  CBC: Recent Labs  Lab 12/21/17 0840 12/21/17 0847 12/21/17 1309 12/22/17 0544  WBC 12.4*  --  14.1* 11.4*  NEUTROABS 9.3*  --   --   --   HGB 10.8* 11.2* 10.5* 10.8*  HCT 33.7* 33.0* 32.6* 34.5*  MCV 90.3  --  89.3 89.8  PLT 215  --  257 607   Basic Metabolic Panel: Recent Labs  Lab 12/21/17 0840 12/21/17 0847 12/21/17 1309 12/22/17 0544  NA  --  130* 136 138  K 7.4* 7.0* 5.8* 4.6  CL  --  100 95* 97*  CO2  --   --  24  28  GLUCOSE  --  69* 37* 74  BUN  --  58* 61* 20  CREATININE  --  14.50* 13.27* 7.02*  CALCIUM  --   --  8.2* 8.1*  PHOS  --   --  7.7*  --    GFR: Estimated Creatinine Clearance: 6.1 mL/min (A) (by C-G formula based on SCr of 7.02 mg/dL (H)). Liver Function Tests: Recent Labs  Lab 12/21/17 0840 12/21/17  1309  AST 19  --   ALT 10  --   ALKPHOS 82  --   BILITOT 0.6  --   PROT 7.2  --   ALBUMIN 2.6* 2.5*   No results for input(s): LIPASE, AMYLASE in the last 168 hours. No results for input(s): AMMONIA in the last 168 hours. Coagulation Profile: No results for input(s): INR, PROTIME in the last 168 hours. Cardiac Enzymes: Recent Labs  Lab 12/21/17 1309  CKTOTAL 46   BNP (last 3 results) No results for input(s): PROBNP in the last 8760 hours. HbA1C: No results for input(s): HGBA1C in the last 72 hours. CBG: Recent Labs  Lab 12/21/17 1045 12/21/17 1107 12/21/17 1129 12/21/17 1352 12/21/17 1601  GLUCAP 119* 117* 80 70 107*   Lipid Profile: No results for input(s): CHOL, HDL, LDLCALC, TRIG, CHOLHDL, LDLDIRECT in the last 72 hours. Thyroid Function Tests: No results for input(s): TSH, T4TOTAL, FREET4, T3FREE, THYROIDAB in the last 72 hours. Anemia Panel: No results for input(s): VITAMINB12, FOLATE, FERRITIN, TIBC, IRON, RETICCTPCT in the last 72 hours. Urine analysis:    Component Value Date/Time   COLORURINE YELLOW 12/30/2014 1419   APPEARANCEUR CLEAR 12/30/2014 1419   LABSPEC 1.009 12/30/2014 1419   PHURINE 7.5 12/30/2014 1419   GLUCOSEU NEGATIVE 12/30/2014 1419   HGBUR NEGATIVE 12/30/2014 1419   HGBUR negative 03/23/2007 1112   BILIRUBINUR NEGATIVE 12/30/2014 1419   KETONESUR NEGATIVE 12/30/2014 1419   PROTEINUR 100 (A) 12/30/2014 1419   UROBILINOGEN 0.2 12/30/2014 1419   NITRITE NEGATIVE 12/30/2014 1419   LEUKOCYTESUR NEGATIVE 12/30/2014 1419     Ripudeep Rai M.D. [facility] 12/22/2017, 10:20 AM  Pager: [phone] Between 7am to 7pm - call  Pager - [phone]  After 7pm go to [email or web address] - password [email or web address]  Call night coverage person covering after 7pm

## 2017-12-22 NOTE — Progress Notes (Signed)
OT Cancellation Note  Patient Details Name: [name] MRN: [id number] DOB: March 08, 1941   Cancelled Treatment:    Reason Eval/Treat Not Completed: Medical issues which prohibited therapy. Noted Vascular Lab note from this morning that pt has a  "Questionable acute positive DVT involving right femoral vein and popliteal vein." Plan to cancel OT eval for now until pt is medically appropriate to mobilize.    Tyrone Schimke OTR/L Pager: [phone]  12/22/2017, 11:42 AM

## 2017-12-23 DIAGNOSIS — K219 Gastro-esophageal reflux disease without esophagitis: Secondary | ICD-10-CM

## 2017-12-23 DIAGNOSIS — K581 Irritable bowel syndrome with constipation: Secondary | ICD-10-CM | POA: Diagnosis present

## 2017-12-23 DIAGNOSIS — D631 Anemia in chronic kidney disease: Secondary | ICD-10-CM | POA: Diagnosis not present

## 2017-12-23 DIAGNOSIS — G609 Hereditary and idiopathic neuropathy, unspecified: Secondary | ICD-10-CM | POA: Diagnosis present

## 2017-12-23 DIAGNOSIS — G40909 Epilepsy, unspecified, not intractable, without status epilepticus: Secondary | ICD-10-CM | POA: Diagnosis present

## 2017-12-23 DIAGNOSIS — J449 Chronic obstructive pulmonary disease, unspecified: Secondary | ICD-10-CM | POA: Diagnosis present

## 2017-12-23 DIAGNOSIS — I120 Hypertensive chronic kidney disease with stage 5 chronic kidney disease or end stage renal disease: Secondary | ICD-10-CM | POA: Diagnosis not present

## 2017-12-23 DIAGNOSIS — F324 Major depressive disorder, single episode, in partial remission: Secondary | ICD-10-CM | POA: Diagnosis present

## 2017-12-23 DIAGNOSIS — F419 Anxiety disorder, unspecified: Secondary | ICD-10-CM | POA: Diagnosis not present

## 2017-12-23 DIAGNOSIS — M109 Gout, unspecified: Secondary | ICD-10-CM | POA: Diagnosis present

## 2017-12-23 DIAGNOSIS — I10 Essential (primary) hypertension: Secondary | ICD-10-CM | POA: Diagnosis not present

## 2017-12-23 DIAGNOSIS — E875 Hyperkalemia: Secondary | ICD-10-CM | POA: Diagnosis present

## 2017-12-23 DIAGNOSIS — I82401 Acute embolism and thrombosis of unspecified deep veins of right lower extremity: Secondary | ICD-10-CM | POA: Diagnosis present

## 2017-12-23 DIAGNOSIS — F4310 Post-traumatic stress disorder, unspecified: Secondary | ICD-10-CM | POA: Diagnosis present

## 2017-12-23 DIAGNOSIS — B009 Herpesviral infection, unspecified: Secondary | ICD-10-CM | POA: Diagnosis present

## 2017-12-23 DIAGNOSIS — I739 Peripheral vascular disease, unspecified: Secondary | ICD-10-CM | POA: Diagnosis present

## 2017-12-23 DIAGNOSIS — Z992 Dependence on renal dialysis: Secondary | ICD-10-CM | POA: Diagnosis not present

## 2017-12-23 DIAGNOSIS — G9341 Metabolic encephalopathy: Secondary | ICD-10-CM | POA: Diagnosis present

## 2017-12-23 DIAGNOSIS — F17210 Nicotine dependence, cigarettes, uncomplicated: Secondary | ICD-10-CM | POA: Diagnosis present

## 2017-12-23 DIAGNOSIS — E785 Hyperlipidemia, unspecified: Secondary | ICD-10-CM | POA: Diagnosis present

## 2017-12-23 DIAGNOSIS — G2581 Restless legs syndrome: Secondary | ICD-10-CM | POA: Diagnosis present

## 2017-12-23 DIAGNOSIS — I5032 Chronic diastolic (congestive) heart failure: Secondary | ICD-10-CM | POA: Diagnosis present

## 2017-12-23 DIAGNOSIS — I447 Left bundle-branch block, unspecified: Secondary | ICD-10-CM | POA: Diagnosis present

## 2017-12-23 DIAGNOSIS — H532 Diplopia: Secondary | ICD-10-CM | POA: Diagnosis present

## 2017-12-23 DIAGNOSIS — F329 Major depressive disorder, single episode, unspecified: Secondary | ICD-10-CM | POA: Diagnosis not present

## 2017-12-23 DIAGNOSIS — N186 End stage renal disease: Secondary | ICD-10-CM | POA: Diagnosis present

## 2017-12-23 DIAGNOSIS — I132 Hypertensive heart and chronic kidney disease with heart failure and with stage 5 chronic kidney disease, or end stage renal disease: Secondary | ICD-10-CM | POA: Diagnosis present

## 2017-12-23 DIAGNOSIS — N2581 Secondary hyperparathyroidism of renal origin: Secondary | ICD-10-CM | POA: Diagnosis not present

## 2017-12-23 DIAGNOSIS — R109 Unspecified abdominal pain: Secondary | ICD-10-CM | POA: Diagnosis not present

## 2017-12-23 DIAGNOSIS — I70211 Atherosclerosis of native arteries of extremities with intermittent claudication, right leg: Secondary | ICD-10-CM | POA: Diagnosis not present

## 2017-12-23 DIAGNOSIS — M79604 Pain in right leg: Secondary | ICD-10-CM | POA: Diagnosis not present

## 2017-12-23 LAB — CBC
HEMATOCRIT: 33.3 % — AB (ref 36.0–46.0)
HEMOGLOBIN: 10.6 g/dL — AB (ref 12.0–15.0)
MCH: 28.6 pg (ref 26.0–34.0)
MCHC: 31.8 g/dL (ref 30.0–36.0)
MCV: 90 fL (ref 78.0–100.0)
Platelets: 221 10*3/uL (ref 150–400)
RBC: 3.7 MIL/uL — ABNORMAL LOW (ref 3.87–5.11)
RDW: 16.3 % — ABNORMAL HIGH (ref 11.5–15.5)
WBC: 8.3 10*3/uL (ref 4.0–10.5)

## 2017-12-23 LAB — BASIC METABOLIC PANEL
Anion gap: 15 (ref 5–15)
BUN: 30 mg/dL — AB (ref 8–23)
CO2: 27 mmol/L (ref 22–32)
CREATININE: 9.19 mg/dL — AB (ref 0.44–1.00)
Calcium: 8.3 mg/dL — ABNORMAL LOW (ref 8.9–10.3)
Chloride: 95 mmol/L — ABNORMAL LOW (ref 98–111)
GFR calc Af Amer: 4 mL/min — ABNORMAL LOW (ref >60)
GFR calc non Af Amer: 4 mL/min — ABNORMAL LOW (ref >60)
GLUCOSE: 86 mg/dL (ref 70–99)
Potassium: 5.4 mmol/L — ABNORMAL HIGH (ref 3.5–5.1)
Sodium: 137 mmol/L (ref 135–145)

## 2017-12-23 LAB — HEPATITIS B SURFACE ANTIGEN: Hepatitis B Surface Ag: NEGATIVE

## 2017-12-23 MED ORDER — WARFARIN - PHARMACIST DOSING INPATIENT: Freq: Every day | Status: DC

## 2017-12-23 MED ORDER — HYDROMORPHONE HCL 1 MG/ML IJ SOLN
INTRAMUSCULAR | Status: AC
  Filled 2017-12-23: qty 0.5

## 2017-12-23 MED ORDER — DOXERCALCIFEROL 4 MCG/2ML IV SOLN
INTRAVENOUS | Status: AC
  Administered 2017-12-23: 1 ug via INTRAVENOUS
  Filled 2017-12-23: qty 2

## 2017-12-23 MED ORDER — HEPARIN BOLUS VIA INFUSION
3000.0000 [IU] | Freq: Once | INTRAVENOUS | Status: AC
  Administered 2017-12-23: 3000 [IU] via INTRAVENOUS
  Filled 2017-12-23: qty 3000

## 2017-12-23 MED ORDER — LORAZEPAM 1 MG PO TABS
1.0000 mg | ORAL_TABLET | Freq: Three times a day (TID) | ORAL | Status: DC | PRN
  Administered 2017-12-23 – 2017-12-24 (×3): 1 mg via ORAL
  Filled 2017-12-23 (×3): qty 1

## 2017-12-23 MED ORDER — WARFARIN SODIUM 5 MG PO TABS
5.0000 mg | ORAL_TABLET | Freq: Once | ORAL | Status: AC
  Administered 2017-12-24: 5 mg via ORAL
  Filled 2017-12-23: qty 1

## 2017-12-23 MED ORDER — HEPARIN (PORCINE) IN NACL 100-0.45 UNIT/ML-% IJ SOLN
1050.0000 [IU]/h | INTRAMUSCULAR | Status: DC
  Administered 2017-12-23 – 2017-12-26 (×3): 1050 [IU]/h via INTRAVENOUS
  Filled 2017-12-23 (×4): qty 250

## 2017-12-23 NOTE — Progress Notes (Signed)
Heparin infusion started at 6:30 pm but couldn't scan it as pt was agitated and cursing out the nurse, couldn't tolerate the bolus so tried and gave at 8 pm.  Oncoming nurse made aware.

## 2017-12-23 NOTE — Progress Notes (Addendum)
[facility]                                                                              Patient Demographics  [name], is a 77 y.o. female, DOB - [birth year], XBM:[id number]  Admit date - 12/21/2017   Admitting Physician Reyne Dumas, MD  Outpatient Primary MD for the patient is [place], Malka So, MD  Outpatient specialists:   LOS - 0  days   Medical records reviewed and are as summarized below:    Chief Complaint  Patient presents with  . Leg Pain       Brief summary   Patient is a 77 year old female with COPD, hypertension, ESRD on hemodialysis MWF, depression, chronic anemia, IBS, restless leg syndrome, PAD, neuropathy status post right superficial femoral artery stenting by Dr. Lucky Cowboy on 12/17/2017 presented with severe right lower extremity pain, sharp, intermittent, progressing since last Thursday 5 days ago.  States tramadol did not help, unable to stand or get a wheelchair hence was unable to go to dialysis due to pain.   Assessment & Plan    Principal Problem:   Right leg pain in the setting of PAD, chronic peripheral neuropathy, recent right superficial femoral artery stenting -No evidence of hematoma, Doppler ultrasound final read negative for DVT, read by Dr. Trula Slade  -Patient was seen by Dr. Donnetta Hutching in the ED on 7/22 and did not feel patient had active vascular disease.   -Unclear of patient's intractable pain, no cellulitis, no fracture or musculoskeletal issue.  Unclear if it is a flow problem, if she needs arterial studies?  Per patient's son, her symptoms started after the vascular procedure.  Requested vascular surgery consult, discussed with Dr. Donzetta Matters who will request Dr. early to reevaluate.  Addendum:  Reviewed Dr Luther Parody note, will start on heparin gtt and coumadin. D/w Dr Melvia Heaps, no NOAC or lovenox given ESRD on HD.   Active Problems:  ESRD on hemodialysis, MWF, hyperkalemia -Last HD on 7/17, states missed hemodialysis due to  pain -Nephrology following, patient back on her schedule, received hemodialysis today  Mild acute metabolic encephalopathy -Per son at the bedside, patient was confused at home, at times hallucinating, symptoms started after she was taking narcotics for the pain -Currently improving, alert and oriented    HYPERCHOLESTEROLEMIA -Continue statins     Gout, unspecified Continue allopurinol  Anemia of chronic disease/CKD -H&H currently stable     Anxiety and depression -Continue Cymbalta    GASTROESOPHAGEAL REFLUX, NO ESOPHAGITIS -Continue PPI  Diastolic chronic CHF (congestive heart failure) ([place]) -Prior echo in 07/24/2017 showed EF of 55 to 02%, grade 2 diastolic dysfunction -Currently stable, volume management with hemodialysis  Essential hypertension -BP stable, continue clonidine, amlodipine, beta-blocker.      Chronic obstructive pulmonary disease (HCC) -Stable, no wheezing    PAD (peripheral artery disease) (HCC) -Continue Plavix  Code Status: Full CODE STATUS DVT Prophylaxis: Heparin subcu Family Communication: Discussed in detail with the patient, all imaging results, lab results explained to the patient   Disposition Plan: Pain still not controlled Time Spent in minutes   35 minutes  Procedures:  HD Consultants:   Nephrology  Vascular surgery  Antimicrobials:      Medications  Scheduled Meds: . allopurinol  100 mg Oral Once per day on Sun Tue Thu Sat  . amLODipine  10 mg Oral QHS  . aspirin EC  81 mg Oral Daily  . atorvastatin  40 mg Oral QHS  . Chlorhexidine Gluconate Cloth  6 each Topical Q0600  . Chlorhexidine Gluconate Cloth  6 each Topical Q0600  . cinacalcet  90 mg Oral QHS  . cloNIDine  0.2 mg Oral Daily  . clopidogrel  75 mg Oral Daily  . doxercalciferol  1 mcg Intravenous Q M,W,F-HD  . DULoxetine  60 mg Oral QHS  . feeding supplement (NEPRO CARB STEADY)  237 mL Oral BID BM  . glycopyrrolate  2 mg Oral BID  . heparin  5,000 Units  Subcutaneous Q8H  . lidocaine-prilocaine  1 application Topical Once per day on Mon Wed Fri  . metoprolol succinate  100 mg Oral BID  . multivitamin  1 tablet Oral QHS  . pantoprazole  20 mg Oral BID AC  . pregabalin  100 mg Oral QHS  . sevelamer carbonate  2,400 mg Oral TID WC   Continuous Infusions: PRN Meds:.acetaminophen **OR** acetaminophen, azelastine, bisacodyl, HYDROmorphone (DILAUDID) injection, hydrOXYzine, ondansetron **OR** ondansetron (ZOFRAN) IV, senna-docusate   Antibiotics   Anti-infectives (From admission, onward)   None        Subjective:   [name] was seen and examined today.  Seen during hemodialysis, had just received IV Dilaudid which improved the right leg pain.  Patient denies dizziness, chest pain, shortness of breath, abdominal pain, N/V/D/C  Objective:   Vitals:   12/23/17 1100 12/23/17 1130 12/23/17 1151 12/23/17 1227  BP: (!) 143/90 (!) 136/42 (!) 123/43 (!) 132/46  Pulse: 92 98 (!) 104 94  Resp:   20 (!) 22  Temp:   (!) 97.5 F (36.4 C) 97.7 F (36.5 C)  TempSrc:   Oral Oral  SpO2:   94%   Weight:   69 kg (152 lb 1.9 oz)   Height:        Intake/Output Summary (Last 24 hours) at 12/23/2017 1306 Last data filed at 12/23/2017 1151 Gross per 24 hour  Intake 300 ml  Output 3000 ml  Net -2700 ml     Wt Readings from Last 3 Encounters:  12/23/17 69 kg (152 lb 1.9 oz)  12/17/17 71.2 kg (157 lb)  12/15/17 71.2 kg (157 lb)     Exam    General: Alert and oriented x 3, NAD, ill-appearing  Eyes:   HEENT:  Cardiovascular: S1 S2 auscultated, RRR, 2/6 systolic murmur  Respiratory: Clear to auscultation bilaterally, no wheezing, rales or rhonchi  Gastrointestinal: Soft, nontender, nondistended, + bowel sounds  Ext: no pedal edema bilaterally  Neuro: no new deficits  Musculoskeletal: No digital cyanosis, clubbing  Skin: No rashes  Psych: Normal affect and demeanor, alert and oriented x3     Data Reviewed:  I have  personally reviewed following labs and imaging studies  Micro Results Recent Results (from the past 240 hour(s))  MRSA PCR Screening     Status: None   Collection Time: 12/21/17  6:12 PM  Result Value Ref Range Status   MRSA by PCR NEGATIVE NEGATIVE Final    Comment:        The GeneXpert MRSA Assay (FDA approved for NASAL specimens only), is one component of a comprehensive MRSA colonization surveillance program. It is not intended to diagnose MRSA infection nor  to guide or monitor treatment for MRSA infections. Performed at [facility], [place] [address] [place], [place] [address]     Radiology Reports US Arterial Seg Multiple  Result Date: 11/24/2017 CLINICAL DATA:  77 year old female with a history of PAD. Cardiovascular risk factors include smoking, hypertension, hyperlipidemia, known vascular surgery, renal failure EXAM: NONINVASIVE PHYSIOLOGIC VASCULAR STUDY OF BILATERAL LOWER EXTREMITIES TECHNIQUE: Evaluation of both lower extremities was performed at rest, including calculation of ankle-brachial indices, multiple segmental pressure evaluation, segmental Doppler and segmental pulse volume recording. COMPARISON:  No prior duplex FINDINGS: Right: Resting ankle brachial index:  0.37 Segmental blood pressure: Right upper extremity pressure 478 systolic. Decrease to the right thigh. Significant drop to the low thigh. Ankle pressure measures 31 systolic. Doppler: Segmental Doppler of the right lower extremity demonstrates monophasic waveform throughout. Pulse volume recording: Segmental PVR of the right lower extremity demonstrates loss of augmentation below knee with deterioration of the waveform throughout. Left: Resting ankle brachial index: 0.70 Segmental blood pressure: Segmental blood pressure of the left arm was not measured. Asymmetric pressure in the thigh. Significant drop below knee. Doppler: Segmental Doppler of the left lower extremity monophasic throughout. Pulse volume  recording: Segmental PVR of the left lower extremity demonstrates waveform amplitude maintained with no deterioration at the ankle. 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 Segmental exam demonstrates multi segment disease including at least iliac and femoropopliteal disease. Left: Resting ABI in the mild range of arterial occlusive disease. This may be falsely elevated given the appearance of the segmental exam. Segmental exam demonstrates multi segment disease including at least iliac and femoropopliteal disease. Signed, Dulcy Fanny. Dellia Nims, RPVI [facility] Radiology Electronically Signed   By: Corrie Mckusick D.O.   On: 11/24/2017 15:03   Dg Chest Port 1 View  Result Date: 12/21/2017 CLINICAL DATA:  77 year old female with acute chest pain today. EXAM: PORTABLE CHEST 1 VIEW COMPARISON:  08/20/2017 and earlier. FINDINGS: Portable AP semi upright view at 0743 hours. Left side HERO graft hemodialysis access redemonstrated. Visible portions appear stable. Chronic elevation of the right hemidiaphragm is stable. Stable mild cardiomegaly and mediastinal contours. No pneumothorax, pulmonary edema, pleural effusion or consolidation. Mild bibasilar atelectasis appears stable. Paucity of bowel gas in the upper abdomen. Visualized tracheal air column is within normal limits. No acute osseous abnormality identified. IMPRESSION: Chronic bibasilar atelectasis. No acute cardiopulmonary abnormality. Electronically Signed   By: Genevie Ann M.D.   On: 12/21/2017 08:04    Lab Data:  CBC: Recent Labs  Lab 12/21/17 0840 12/21/17 0847 12/21/17 1309 12/22/17 0544 12/23/17 0444  WBC 12.4*  --  14.1* 11.4* 8.3  NEUTROABS 9.3*  --   --   --   --   HGB 10.8* 11.2* 10.5* 10.8* 10.6*  HCT 33.7* 33.0* 32.6* 34.5* 33.3*  MCV 90.3  --  89.3 89.8 90.0  PLT 215  --  257 225 295   Basic Metabolic Panel: Recent Labs  Lab  12/21/17 0840 12/21/17 0847 12/21/17 1309 12/22/17 0544 12/23/17 0444  NA  --  130* 136 138 137  K 7.4* 7.0* 5.8* 4.6 5.4*  CL  --  100 95* 97* 95*  CO2  --   --  24 28 27   GLUCOSE  --  69* 37* 74 86  BUN  --  58* 61* 20 30*  CREATININE  --  14.50* 13.27* 7.02* 9.19*  CALCIUM  --   --  8.2* 8.1* 8.3*  PHOS  --   --  7.7*  --   --    GFR: Estimated Creatinine Clearance: 4.6 mL/min (A) (by C-G formula based on SCr of 9.19 mg/dL (H)). Liver Function Tests: Recent Labs  Lab 12/21/17 0840 12/21/17 1309  AST 19  --   ALT 10  --   ALKPHOS 82  --   BILITOT 0.6  --   PROT 7.2  --   ALBUMIN 2.6* 2.5*   No results for input(s): LIPASE, AMYLASE in the last 168 hours. No results for input(s): AMMONIA in the last 168 hours. Coagulation Profile: No results for input(s): INR, PROTIME in the last 168 hours. Cardiac Enzymes: Recent Labs  Lab 12/21/17 1309  CKTOTAL 46   BNP (last 3 results) No results for input(s): PROBNP in the last 8760 hours. HbA1C: No results for input(s): HGBA1C in the last 72 hours. CBG: Recent Labs  Lab 12/21/17 1045 12/21/17 1107 12/21/17 1129 12/21/17 1352 12/21/17 1601  GLUCAP 119* 117* 80 70 107*   Lipid Profile: No results for input(s): CHOL, HDL, LDLCALC, TRIG, CHOLHDL, LDLDIRECT in the last 72 hours. Thyroid Function Tests: No results for input(s): TSH, T4TOTAL, FREET4, T3FREE, THYROIDAB in the last 72 hours. Anemia Panel: No results for input(s): VITAMINB12, FOLATE, FERRITIN, TIBC, IRON, RETICCTPCT in the last 72 hours. Urine analysis:    Component Value Date/Time   COLORURINE YELLOW 12/30/2014 1419   APPEARANCEUR CLEAR 12/30/2014 1419   LABSPEC 1.009 12/30/2014 1419   PHURINE 7.5 12/30/2014 1419   GLUCOSEU NEGATIVE 12/30/2014 1419   HGBUR NEGATIVE 12/30/2014 1419   HGBUR negative 03/23/2007 1112   BILIRUBINUR NEGATIVE 12/30/2014 1419   KETONESUR NEGATIVE 12/30/2014 1419   PROTEINUR 100 (A) 12/30/2014 1419   UROBILINOGEN 0.2  12/30/2014 1419   NITRITE NEGATIVE 12/30/2014 1419   LEUKOCYTESUR NEGATIVE 12/30/2014 1419     Batool Majid M.D. [facility] 12/23/2017, 1:06 PM  Pager: [phone] Between 7am to 7pm - call Pager - [phone]  After 7pm go to [email or web address] - password [email or web address]  Call night coverage person covering after 7pm

## 2017-12-23 NOTE — Progress Notes (Addendum)
[facility] Progress Note   Subjective:  Pt on HD now.   Objective Vitals:   12/23/17 1030 12/23/17 1100 12/23/17 1130 12/23/17 1227  BP: (!) 154/50 (!) 143/90 (!) 136/42 (!) 132/46  Pulse: 89 92 98 94  Resp:    (!) 22  Temp:    97.7 F (36.5 C)  TempSrc:    Oral  SpO2:      Weight:      Height:       Physical Exam General:NAD, chronically ill appearing female Heart:RRR, +1/9 systolic murmur Lungs:CTAB Abdomen:soft, NTND Extremities:no LE edema  Dialysis Access: LU HeRO +b  Filed Weights   12/21/17 2012 12/22/17 2107 12/23/17 0800  Weight: 69.2 kg (152 lb 8.9 oz) 69.2 kg (152 lb 8.9 oz) 72.3 kg (159 lb 6.3 oz)    Intake/Output Summary (Last 24 hours) at 12/23/2017 1231 Last data filed at 12/23/2017 0535 Gross per 24 hour  Intake 300 ml  Output 0 ml  Net 300 ml    Additional Objective Labs: Basic Metabolic Panel: Recent Labs  Lab 12/21/17 1309 12/22/17 0544 12/23/17 0444  NA 136 138 137  K 5.8* 4.6 5.4*  CL 95* 97* 95*  CO2 24 28 27   GLUCOSE 37* 74 86  BUN 61* 20 30*  CREATININE 13.27* 7.02* 9.19*  CALCIUM 8.2* 8.1* 8.3*  PHOS 7.7*  --   --    Liver Function Tests: Recent Labs  Lab 12/21/17 0840 12/21/17 1309  AST 19  --   ALT 10  --   ALKPHOS 82  --   BILITOT 0.6  --   PROT 7.2  --   ALBUMIN 2.6* 2.5*   CBC: Recent Labs  Lab 12/21/17 0840  12/21/17 1309 12/22/17 0544 12/23/17 0444  WBC 12.4*  --  14.1* 11.4* 8.3  NEUTROABS 9.3*  --   --   --   --   HGB 10.8*   < > 10.5* 10.8* 10.6*  HCT 33.7*   < > 32.6* 34.5* 33.3*  MCV 90.3  --  89.3 89.8 90.0  PLT 215  --  257 225 221   < > = values in this interval not displayed.   Cardiac Enzymes: Recent Labs  Lab 12/21/17 1309  CKTOTAL 46   CBG: Recent Labs  Lab 12/21/17 1045 12/21/17 1107 12/21/17 1129 12/21/17 1352 12/21/17 1601  GLUCAP 119* 117* 80 70 107*   Iron Studies: No results for input(s): IRON, TIBC, TRANSFERRIN, FERRITIN in the last 72 hours. Lab  Results  Component Value Date   INR 1.14 11/14/2013   INR 1.10 04/14/2012   INR 1.14 05/06/2010   Studies/Results: No results found.  Medications:  . allopurinol  100 mg Oral Once per day on Sun Tue Thu Sat  . amLODipine  10 mg Oral QHS  . aspirin EC  81 mg Oral Daily  . atorvastatin  40 mg Oral QHS  . Chlorhexidine Gluconate Cloth  6 each Topical Q0600  . Chlorhexidine Gluconate Cloth  6 each Topical Q0600  . cinacalcet  90 mg Oral QHS  . cloNIDine  0.2 mg Oral Daily  . clopidogrel  75 mg Oral Daily  . doxercalciferol  1 mcg Intravenous Q M,W,F-HD  . DULoxetine  60 mg Oral QHS  . feeding supplement (NEPRO CARB STEADY)  237 mL Oral BID BM  . glycopyrrolate  2 mg Oral BID  . heparin  5,000 Units Subcutaneous Q8H  . lidocaine-prilocaine  1 application Topical Once per day  on Mon Wed Fri  . metoprolol succinate  100 mg Oral BID  . multivitamin  1 tablet Oral QHS  . pantoprazole  20 mg Oral BID AC  . pregabalin  100 mg Oral QHS  . sevelamer carbonate  2,400 mg Oral TID WC    Dialysis Orders: MWF [facility] 3h 67min  71kg  2/2.25  LU HeRO  Hep 4000 Mircera 50 mcg q2wks - last 12/07/17 Hectorol 65mcg IV qHD     Assessment/Plan: 1. RLE pain - Hx PAD w/RLE resting pain sp recent intervention by Dr. Lucky Cowboy on 7/18.   Per primary. 2.  ESRD -  HD MWF.  HD today.  3.  Hypertension/volume  - BP's ok, no gross vol excess on exam.  4.  Anemia of CKD - Hgb 10.8. No indication for ESA at this time.  5.  Secondary Hyperparathyroidism -  Ca 8.1, Phos 7.7. Continue binders and VDRA. 6.  Nutrition - Alb 2.5. Renal diet with fluid restrictions. Renavite. Nepro 7. Hx seizure d/o - per primary 8. Hx depression/PTSD - per primary   Kelly Splinter MD [place] pager [phone]   12/23/2017, 12:31 PM

## 2017-12-23 NOTE — Progress Notes (Signed)
The patient is currently resting comfortably.  Her daughter is in the room.  She denies any discomfort in her leg or elsewhere.  She refuses to let me examine her.  She states that she does not like to have them in touch her.  She will not let me check her pulses.  She insists that she feels well and denies any pain.  I discussed this in private with the patient's daughter after leaving the room.  She states that she gets like this and that currently is not having any pain.  We will not follow actively.  Please call if we can assist.   Duplex did suggest DVT and would treat this as a standard DVT treatment

## 2017-12-23 NOTE — Evaluation (Addendum)
Physical Therapy Evaluation Patient Details Name: [name] MRN: [id number] DOB: Jan 14, 1941 Today's Date: 12/23/2017   History of Present Illness  [name] is a 77 y.o. female with extensive medical history listed below, including COPD, hypertension, CHF, ESRD on hemodialysis Monday Wednesday and Friday, history of major depression, chronic anemia, IBS, restless leg syndrome, chronic peripheral neuropathy, peripheral vascular disease-peripheral arterial disease, status post right superficial femoral artery stenting by Dr. Lucky Cowboy on 12/17/2017, presenting with severe right lower extremity pain, sharp, "comes in bouts ", progressing since last Thursday, extending from the toes to the right groin.  The patient reports having neuropathy, and admits to be very hypersensitive to touch.  At home, she took tramadol, but did not seem to help, last taken at 3 AM today.  The left leg has also some pain, but not as severe as on the right.  She was unable to stand or getting a wheelchair on Friday, and was unable to go to dialysis due to the symptoms. Doppler ultrasound negative for acute DVT.  Clinical Impression  Patient uses a cane for mobility at baseline and has an aide who assists with ADL's. On PT evaluation, patient is presenting with significantly decreased functional mobility secondary to right leg pain, cognitive deficits (decreased awareness of deficits), and generalized weakness. Patient requiring up to total assistance for sitting up at edge of bed with increased time and effort. Extremely hypersensitive to movement or palpation of RLE which resulted in limited examination. Discussed ST rehab at discharge with patient and patient family. Will follow acutely.    Follow Up Recommendations SNF;Supervision/Assistance - 24 hour (also may qualify for [place] if refuses SNF)    Equipment Recommendations  Defer to SNF (need to confirm if pt has wheelchair)   Recommendations for Other Services        Precautions / Restrictions Precautions Precautions: Fall Precaution Comments: severe right leg pain with any palpation or movement  Restrictions Weight Bearing Restrictions: No      Mobility  Bed Mobility Overal bed mobility: Needs Assistance Bed Mobility: Supine to Sit;Sit to Supine     Supine to sit: Total assist Sit to supine: Mod assist   General bed mobility comments: Mod assist to scoot hips back with bed pad as patient was sitting with her hips too close to edge of bed. Able to progress LLE onto bed with significantly increased time and required assist for RLE.  Transfers                 General transfer comment: deferred  Ambulation/Gait                Stairs            Wheelchair Mobility    Modified Rankin (Stroke Patients Only)       Balance Overall balance assessment: Needs assistance Sitting-balance support: No upper extremity supported;Feet supported Sitting balance-Leahy Scale: Fair                                       Pertinent Vitals/Pain Pain Assessment: Faces Faces Pain Scale: Hurts worst Pain Location: right leg, groin Pain Descriptors / Indicators: Moaning;Guarding;Grimacing;Burning Pain Intervention(s): Limited activity within patient's tolerance;Monitored during session;Premedicated before session    Home Living Family/patient expects to be discharged to:: Private residence Living Arrangements: Other relatives(2 daughters, 1 niece) Available Help at Discharge: Family Type of Home: House  Home Access: Stairs to enter   Entrance Stairs-Number of Steps: 1 Home Layout: One level Home Equipment: Cane - single point;Walker - 2 wheels      Prior Function Level of Independence: Needs assistance   Gait / Transfers Assistance Needed: uses cane mostly to ambulate, also has a walker  ADL's / Homemaking Assistance Needed: aide comes 3 times a week to assist with ADL's        Hand Dominance         Extremity/Trunk Assessment   Upper Extremity Assessment Upper Extremity Assessment: Defer to OT evaluation    Lower Extremity Assessment Lower Extremity Assessment: RLE deficits/detail;LLE deficits/detail RLE Deficits / Details: extremely painful and sensitive to touch or movement. Noted increased edema of ankle and foot. Gross weakness but unable to fully assess  RLE: Unable to fully assess due to pain LLE Deficits / Details: When asked to bend knee or raise leg, patient unable to perform and actively guarding leg against passive movement, but noted patient was able to independently elevate left leg back onto bed with increased time. LLE: Unable to fully assess due to pain       Communication   Communication: No difficulties  Cognition Arousal/Alertness: Awake/alert Behavior During Therapy: Agitated;Anxious Overall Cognitive Status: Impaired/Different from baseline Area of Impairment: Orientation;Following commands;Safety/judgement;Awareness;Problem solving;Attention;Memory                 Orientation Level: Disoriented to;Time(states the month is December) Current Attention Level: Focused Memory: Decreased short-term memory Following Commands: Follows one step commands inconsistently Safety/Judgement: Decreased awareness of deficits Awareness: Intellectual Problem Solving: Slow processing;Decreased initiation;Requires verbal cues;Requires tactile cues General Comments: Patient moaning throughout treatment secondary to pain and became very agitated, withdrawn, and refused to make eye contact with therapist or answer questions by end of session. Stated PT "refused to answer her questions," although PT addressed questions to their entirety multiple times. Stated PT "cut" her hand and consistently perseverated on, "you cut my hand," but upon examination, there was only dry skin noted on the palm of the hand. Then began to perseverate on the statement, "this leg is swollen."         General Comments      Exercises General Exercises - Lower Extremity Long Arc Quad: 5 reps;Both;AAROM   Assessment/Plan    PT Assessment Patient needs continued PT services  PT Problem List Decreased strength;Decreased range of motion;Decreased activity tolerance;Decreased mobility;Decreased balance;Decreased cognition;Decreased safety awareness;Pain       PT Treatment Interventions Gait training;DME instruction;Functional mobility training;Therapeutic activities;Therapeutic exercise;Balance training;Patient/family education;Wheelchair mobility training    PT Goals (Current goals can be found in the Care Plan section)  Acute Rehab PT Goals Patient Stated Goal: go home PT Goal Formulation: With patient Time For Goal Achievement: 01/06/18 Potential to Achieve Goals: Fair    Frequency Min 3X/week   Barriers to discharge        Co-evaluation               AM-PAC PT "6 Clicks" Daily Activity  Outcome Measure Difficulty turning over in bed (including adjusting bedclothes, sheets and blankets)?: Unable Difficulty moving from lying on back to sitting on the side of the bed? : Unable Difficulty sitting down on and standing up from a chair with arms (e.g., wheelchair, bedside commode, etc,.)?: Unable Help needed moving to and from a bed to chair (including a wheelchair)?: Total Help needed walking in hospital room?: Total Help needed climbing 3-5 steps with a railing? : Total 6  Click Score: 6    End of Session   Activity Tolerance: Patient limited by pain;Treatment limited secondary to agitation Patient left: in bed;with call bell/phone within reach;with bed alarm set;with family/visitor present Nurse Communication: Mobility status PT Visit Diagnosis: Other abnormalities of gait and mobility (R26.89);Muscle weakness (generalized) (M62.81);Pain Pain - Right/Left: Right Pain - part of body: Leg    Time: 9563-8756 PT Time Calculation (min) (ACUTE ONLY): 44  min   Charges:   PT Evaluation $PT Eval Moderate Complexity: 1 Mod PT Treatments $Therapeutic Activity: 23-37 mins   PT G Codes:       Ellamae Sia, PT, DPT Acute Rehabilitation Services  Pager: [place] 12/23/2017, 3:19 PM

## 2017-12-23 NOTE — Progress Notes (Signed)
ANTICOAGULATION CONSULT NOTE - Initial Consult  Pharmacy Consult for heparin and coumadin Indication: DVT  Allergies  Allergen Reactions  . Tuberculin Tests Hives    "blisters"  . Valacyclovir Other (See Comments)    Confusion and nervousness  . Codeine Nausea And Vomiting  . Penicillins Rash    No problems breathing. Has tolerated omnicef in past without issue Has patient had a PCN reaction causing immediate rash, facial/tongue/throat swelling, SOB or lightheadedness with hypotension: Yes Has patient had a PCN reaction causing severe rash involving mucus membranes or skin necrosis: No Has patient had a PCN reaction that required hospitalization No Has patient had a PCN reaction occurring within the last 10 years: No If all of the above answers are "NO", then may proceed with Cephalosporin  . Sulfamethoxazole Rash    Patient Measurements: Height: 5\' 1"  (154.9 cm) Weight: 152 lb 1.9 oz (69 kg) IBW/kg (Calculated) : 47.8 Heparin Dosing Weight: 63 kg  Vital Signs: Temp: 99.6 F (37.6 C) (07/24 1544) Temp Source: Oral (07/24 1544) BP: 131/60 (07/24 1544) Pulse Rate: 93 (07/24 1544)  Labs: Recent Labs    12/21/17 1309 12/22/17 0544 12/23/17 0444  HGB 10.5* 10.8* 10.6*  HCT 32.6* 34.5* 33.3*  PLT 257 225 221  CREATININE 13.27* 7.02* 9.19*  CKTOTAL 46  --   --     Estimated Creatinine Clearance: 4.6 mL/min (A) (by C-G formula based on SCr of 9.19 mg/dL (H)).   Medical History: Past Medical History:  Diagnosis Date  . Abnormality of gait 03/28/2015  . Adrenal mass ([place])   . 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 Arthritis   . Back pain   . CHF (congestive heart failure) ([place])   . Chronic kidney disease    Hemo MWF  . Congestion of throat    Pt states she has a lot mucus in back throat.  . Constipation   . COPD (chronic obstructive pulmonary disease) ([place])   . Depression   . Dialysis patient [facility])    kidney  .  Diverticulitis   . GERD (gastroesophageal reflux disease)   . H/O hiatal hernia   . High cholesterol   . Hoarseness of voice   . Hyperlipidemia   . Hypertension   . IBS (irritable bowel syndrome)   . Meralgia paresthetica of right side 12/26/2014  . Normal cardiac stress test 12/24/2009   lexiscan, imaging normal  . PAD (peripheral artery disease) ([place]) 12/21/2017  . Pneumonia    [id number]  . Renal disorder   . RLS (restless legs syndrome)   . Seizures ([place])    2004 past brain surgery  . Sinus complaint   . Stroke ([place])    TIAs per patient 2 or 3  . Thyroid disease   . TIA (transient ischemic attack)   . Tubular adenoma of colon 01/2008   Assessment: 64 YOF with hx of ESRD on HD, admitted for R leg pain, LE doppler showed questionable acute DVT in RLE, pharmacy is consulted to start heparin and warfarin. hgb 10.6, pltc 221K.  No baseline INR  Goal of Therapy:  INR 2-3 Heparin level 0.3-0.7 units/ml Monitor platelets by anticoagulation protocol: Yes   Plan:  - Heparin bolus 3000 units - Heparin infusion 1050 units/hr - f/u 8 hr heparin level in AM - baseline INR STAT - coumadin 5mg  po x 1 onight - daily heparin level and CBC - f/u plans for long-term anticoagulation -  D/c sq heparin  [name], PharmD, BCPS, BCPPS Clinical Pharmacist  Pager: [phone]   12/23/2017,5:50 PM

## 2017-12-24 ENCOUNTER — Inpatient Hospital Stay (HOSPITAL_COMMUNITY): Payer: Medicare Other

## 2017-12-24 DIAGNOSIS — E875 Hyperkalemia: Secondary | ICD-10-CM

## 2017-12-24 LAB — PROTIME-INR
INR: 1.31
PROTHROMBIN TIME: 16.1 s — AB (ref 11.4–15.2)

## 2017-12-24 LAB — BASIC METABOLIC PANEL
Anion gap: 14 (ref 5–15)
BUN: 14 mg/dL (ref 8–23)
CO2: 28 mmol/L (ref 22–32)
Calcium: 9 mg/dL (ref 8.9–10.3)
Chloride: 94 mmol/L — ABNORMAL LOW (ref 98–111)
Creatinine, Ser: 5.95 mg/dL — ABNORMAL HIGH (ref 0.44–1.00)
GFR calc Af Amer: 7 mL/min — ABNORMAL LOW (ref >60)
GFR, EST NON AFRICAN AMERICAN: 6 mL/min — AB (ref >60)
GLUCOSE: 81 mg/dL (ref 70–99)
Potassium: 4.4 mmol/L (ref 3.5–5.1)
SODIUM: 136 mmol/L (ref 135–145)

## 2017-12-24 LAB — CBC
HCT: 39.8 % (ref 36.0–46.0)
Hemoglobin: 12.7 g/dL (ref 12.0–15.0)
MCH: 28.5 pg (ref 26.0–34.0)
MCHC: 31.9 g/dL (ref 30.0–36.0)
MCV: 89.4 fL (ref 78.0–100.0)
Platelets: 278 10*3/uL (ref 150–400)
RBC: 4.45 MIL/uL (ref 3.87–5.11)
RDW: 16.5 % — ABNORMAL HIGH (ref 11.5–15.5)
WBC: 11.5 10*3/uL — ABNORMAL HIGH (ref 4.0–10.5)

## 2017-12-24 LAB — HEPARIN LEVEL (UNFRACTIONATED)
HEPARIN UNFRACTIONATED: 0.48 [IU]/mL (ref 0.30–0.70)
Heparin Unfractionated: 0.39 IU/mL (ref 0.30–0.70)

## 2017-12-24 MED ORDER — FENTANYL CITRATE (PF) 100 MCG/2ML IJ SOLN
25.0000 ug | INTRAMUSCULAR | Status: DC | PRN
  Administered 2017-12-25 – 2017-12-26 (×5): 25 ug via INTRAVENOUS
  Filled 2017-12-24 (×6): qty 2

## 2017-12-24 MED ORDER — SENNOSIDES-DOCUSATE SODIUM 8.6-50 MG PO TABS
1.0000 | ORAL_TABLET | Freq: Two times a day (BID) | ORAL | Status: DC
  Administered 2017-12-24 – 2017-12-27 (×6): 1 via ORAL
  Filled 2017-12-24 (×6): qty 1

## 2017-12-24 MED ORDER — DICYCLOMINE HCL 10 MG PO CAPS
10.0000 mg | ORAL_CAPSULE | Freq: Three times a day (TID) | ORAL | Status: DC
  Administered 2017-12-24 – 2017-12-27 (×12): 10 mg via ORAL
  Filled 2017-12-24 (×12): qty 1

## 2017-12-24 MED ORDER — CHLORHEXIDINE GLUCONATE CLOTH 2 % EX PADS: 6.0000 | MEDICATED_PAD | Freq: Every day | CUTANEOUS | Status: DC

## 2017-12-24 MED ORDER — SORBITOL 70 % SOLN
960.0000 mL | TOPICAL_OIL | Freq: Once | ORAL | Status: AC
  Administered 2017-12-24: 960 mL via RECTAL
  Filled 2017-12-24: qty 473

## 2017-12-24 MED ORDER — WARFARIN SODIUM 5 MG PO TABS
5.0000 mg | ORAL_TABLET | Freq: Once | ORAL | Status: AC
  Administered 2017-12-24: 5 mg via ORAL
  Filled 2017-12-24: qty 1

## 2017-12-24 NOTE — Progress Notes (Signed)
OT Cancellation Note  Patient Details Name: [name] MRN: [id number] DOB: 09-27-1940   Cancelled Treatment:    Reason Eval/Treat Not Completed: Patient at procedure or test/ unavailable. Pt leaving unit for xray  [name] 12/24/2017, 9:31 AM

## 2017-12-24 NOTE — Care Management Note (Addendum)
Case Management Note  Patient Details  Name: [name] MRN: [id number] Date of Birth: March 04, 1941  Subjective/Objective:       Presents with  severe right lower extremity pain, hx of COPD, hypertension, ESRD on hemodialysis MWF, depression, chronic anemia, IBS, restless leg syndrome, PAD, neuropathy status post right superficial femoral artery stenting  12/17/2017. Resides with daughterCarlyon Shadow) and granddaughter per pt. Receives PCS hrs provided by [facility], 8:30-10:30 am (M-Sat.). DME: cane , walker (rolator), BSC.    7/26 NCM spoke with pt's daughter, [name], regarding d/c plan. Daughter interested in being informed about [employer] program and asked if liaison would give her a call. NCM called and left voice message with liaison regarding daughter's interest....NCM will continue monitoring  PCP: Arther Abbott  Action/Plan:  Per PT's evaluation/recommendation :  SNF;Supervision/Assistance - 24 hour (also may qualify for [place] if refuses SNF).  CSW aware and following for SNF placement.  NCM will continue to monitor ...  Expected Discharge Date:                  Expected Discharge Plan:     In-House Referral:     Discharge planning Services  CM Consult  Post Acute Care Choice:    Choice offered to:     DME Arranged:  [name]), pt owns DME Agency:     HH Arranged:    [name] Agency:     Status of Service:  In process, will continue to follow  If discussed at Long Length of Stay Meetings, dates discussed:    Additional Comments:  Sharin Mons, RN 12/24/2017, 3:36 PM

## 2017-12-24 NOTE — Progress Notes (Addendum)
[facility] Progress Note   Subjective:   Leg pain improved.  Admits to weakness and feels like her "leg flushes every now and then."  Objective Vitals:   12/23/17 2104 12/24/17 0506 12/24/17 0922 12/24/17 1122  BP: (!) 146/48 (!) 128/41 (!) 119/48 (!) 130/56  Pulse: 98 91 84 83  Resp: 18 16 18    Temp: 98.4 F (36.9 C) 97.6 F (36.4 C) 98 F (36.7 C)   TempSrc: Oral  Oral   SpO2: 98% 93% 99% 96%  Weight: 69 kg (152 lb 1.9 oz)     Height:       Physical Exam General:NAD, chronically ill appearing female Lungs:nml WOB Extremities:no LE edema appreciated Dialysis Access: LU HeRO   Filed Weights   12/23/17 0800 12/23/17 1151 12/23/17 2104  Weight: 72.3 kg (159 lb 6.3 oz) 69 kg (152 lb 1.9 oz) 69 kg (152 lb 1.9 oz)    Intake/Output Summary (Last 24 hours) at 12/24/2017 1151 Last data filed at 12/24/2017 0900 Gross per 24 hour  Intake 437.56 ml  Output 0 ml  Net 437.56 ml    Additional Objective Labs: Basic Metabolic Panel: Recent Labs  Lab 12/21/17 1309 12/22/17 0544 12/23/17 0444 12/24/17 0420  NA 136 138 137 136  K 5.8* 4.6 5.4* 4.4  CL 95* 97* 95* 94*  CO2 24 28 27 28   GLUCOSE 37* 74 86 81  BUN 61* 20 30* 14  CREATININE 13.27* 7.02* 9.19* 5.95*  CALCIUM 8.2* 8.1* 8.3* 9.0  PHOS 7.7*  --   --   --    Liver Function Tests: Recent Labs  Lab 12/21/17 0840 12/21/17 1309  AST 19  --   ALT 10  --   ALKPHOS 82  --   BILITOT 0.6  --   PROT 7.2  --   ALBUMIN 2.6* 2.5*   CBC: Recent Labs  Lab 12/21/17 0840  12/21/17 1309 12/22/17 0544 12/23/17 0444 12/24/17 0420  WBC 12.4*  --  14.1* 11.4* 8.3 11.5*  NEUTROABS 9.3*  --   --   --   --   --   HGB 10.8*   < > 10.5* 10.8* 10.6* 12.7  HCT 33.7*   < > 32.6* 34.5* 33.3* 39.8  MCV 90.3  --  89.3 89.8 90.0 89.4  PLT 215  --  257 225 221 278   < > = values in this interval not displayed.    Cardiac Enzymes: Recent Labs  Lab 12/21/17 1309  CKTOTAL 46   CBG: Recent Labs  Lab  12/21/17 1045 12/21/17 1107 12/21/17 1129 12/21/17 1352 12/21/17 1601  GLUCAP 119* 117* 80 70 107*   Lab Results  Component Value Date   INR 1.31 12/24/2017   INR 1.14 11/14/2013   INR 1.10 04/14/2012   Studies/Results: Dg Abd 2 Views  Result Date: 12/24/2017 CLINICAL DATA:  Abdominal pain. EXAM: ABDOMEN - 2 VIEW COMPARISON:  CT abdomen and pelvis 08/20/2017 FINDINGS: No intraperitoneal free air is identified. There is stool throughout the colon, with a large amount of stool noted in the ascending colon and rectum. No dilated loops of bowel are seen to suggest obstruction. Atherosclerotic vascular calcifications and pelvic surgical clips are noted. A HERO graft is noted in the chest. Bibasilar lung opacities are similar to a recent chest radiograph from 12/21/2017 and likely represent atelectasis. IMPRESSION: Large amount of colonic and rectal stool. No evidence of bowel obstruction. Electronically Signed   By: Seymour Bars.D.  On: 12/24/2017 10:01    Medications: . heparin 1,050 Units/hr (12/23/17 2009)   . allopurinol  100 mg Oral Once per day on Sun Tue Thu Sat  . amLODipine  10 mg Oral QHS  . atorvastatin  40 mg Oral QHS  . Chlorhexidine Gluconate Cloth  6 each Topical Q0600  . Chlorhexidine Gluconate Cloth  6 each Topical Q0600  . cinacalcet  90 mg Oral QHS  . cloNIDine  0.2 mg Oral Daily  . clopidogrel  75 mg Oral Daily  . dicyclomine  10 mg Oral TID AC & HS  . doxercalciferol  1 mcg Intravenous Q M,W,F-HD  . DULoxetine  60 mg Oral QHS  . feeding supplement (NEPRO CARB STEADY)  237 mL Oral BID BM  . glycopyrrolate  2 mg Oral BID  . lidocaine-prilocaine  1 application Topical Once per day on Mon Wed Fri  . metoprolol succinate  100 mg Oral BID  . multivitamin  1 tablet Oral QHS  . pantoprazole  20 mg Oral BID AC  . pregabalin  100 mg Oral QHS  . senna-docusate  1 tablet Oral BID  . sevelamer carbonate  2,400 mg Oral TID WC  . warfarin  5 mg Oral ONCE-1800  .  Warfarin - Pharmacist Dosing Inpatient   Does not apply q1800    Dialysis Orders: MWF [facility] 3h 67min  71kg  2/2.25  LU HeRO  Hep 4000 Mircera51mcg q2wks - last 12/07/17 Hectorol38mcg IV qHD    Assessment/Plan: 1. RLE pain - possibly due to DVT- doppler concerning for DVT, anticoagulation started.  Hx PAD w/RLE resting pain s/p recent intervention by Dr. Lucky Cowboy on 7/18.  2. ESRD - HD MWF.  Will write orders for HD tomorrow 3. Hypertension/volume - BP's ok, no gross vol excess on exam.  4. Anemia of CKD - Hgb 12.7. No indication for ESA at this time. 5. Secondary Hyperparathyroidism - Ca 9.0, Phos 7.7.Continue binders and VDRA. 6. Nutrition - Alb 2.5. Renal diet with fluid restrictions. Renavite. Nepro 7. Hx seizure d/o - per primary 8. Hx depression/PTSD - per primary   Jen Mow, PA-C [facility] Pager: [phone] 12/24/2017,11:51 AM  LOS: 1 day   Pt seen, examined and agree w A/P as above.  Kelly Splinter MD [employer] pager [phone]   12/24/2017, 12:50 PM

## 2017-12-24 NOTE — Progress Notes (Signed)
[place] for heparin Indication: DVT  Allergies  Allergen Reactions  . Tuberculin Tests Hives    "blisters"  . Valacyclovir Other (See Comments)    Confusion and nervousness  . Codeine Nausea And Vomiting  . Penicillins Rash    No problems breathing. Has tolerated omnicef in past without issue Has patient had a PCN reaction causing immediate rash, facial/tongue/throat swelling, SOB or lightheadedness with hypotension: Yes Has patient had a PCN reaction causing severe rash involving mucus membranes or skin necrosis: No Has patient had a PCN reaction that required hospitalization No Has patient had a PCN reaction occurring within the last 10 years: No If all of the above answers are "NO", then may proceed with Cephalosporin  . Sulfamethoxazole Rash    Patient Measurements: Height: 5\' 1"  (154.9 cm) Weight: 152 lb 1.9 oz (69 kg) IBW/kg (Calculated) : 47.8 Heparin Dosing Weight: 63 kg  Vital Signs: Temp: 98 F (36.7 C) (07/25 0922) Temp Source: Oral (07/25 0922) BP: 130/56 (07/25 1122) Pulse Rate: 83 (07/25 1122)  Labs: Recent Labs    12/21/17 1309 12/22/17 0544 12/23/17 0444 12/24/17 0420 12/24/17 1131  HGB 10.5* 10.8* 10.6* 12.7  --   HCT 32.6* 34.5* 33.3* 39.8  --   PLT 257 225 221 278  --   LABPROT  --   --   --  16.1*  --   INR  --   --   --  1.31  --   HEPARINUNFRC  --   --   --  0.48 0.39  CREATININE 13.27* 7.02* 9.19* 5.95*  --   CKTOTAL 46  --   --   --   --     Estimated Creatinine Clearance: 7.1 mL/min (A) (by C-G formula based on SCr of 5.95 mg/dL (H)).    Assessment: 41 YOF with hx of ESRD on HD, admitted for R leg pain, LE doppler showed questionable acute DVT in RLE, pharmacy is consulted to start heparin and warfarin. hgb 12.7, pltc 278K.  No baseline INR  Heparin level this am therapeutic 0.39, INR 1.31  Goal of Therapy:  INR 2-3 Heparin level 0.3-0.7 units/ml Monitor platelets by anticoagulation  protocol: Yes   Plan:  - Continue Heparin infusion at 1050 units/hr - Heparin level in AM - coumadin 5mg  po x 1 today  - daily heparin level, INR and CBC  Sheyenne Konz A. Levada Dy, PharmD, [place] Pager: [phone] Please utilize Amion for appropriate phone number to reach the unit pharmacist ([place])    12/24/2017,12:18 PM

## 2017-12-24 NOTE — Progress Notes (Signed)
[place] for heparin and coumadin Indication: DVT  Allergies  Allergen Reactions  . Tuberculin Tests Hives    "blisters"  . Valacyclovir Other (See Comments)    Confusion and nervousness  . Codeine Nausea And Vomiting  . Penicillins Rash    No problems breathing. Has tolerated omnicef in past without issue Has patient had a PCN reaction causing immediate rash, facial/tongue/throat swelling, SOB or lightheadedness with hypotension: Yes Has patient had a PCN reaction causing severe rash involving mucus membranes or skin necrosis: No Has patient had a PCN reaction that required hospitalization No Has patient had a PCN reaction occurring within the last 10 years: No If all of the above answers are "NO", then may proceed with Cephalosporin  . Sulfamethoxazole Rash    Patient Measurements: Height: 5\' 1"  (154.9 cm) Weight: 152 lb 1.9 oz (69 kg) IBW/kg (Calculated) : 47.8 Heparin Dosing Weight: 63 kg  Vital Signs: Temp: 97.6 F (36.4 C) (07/25 0506) Temp Source: Oral (07/24 2104) BP: 128/41 (07/25 0506) Pulse Rate: 91 (07/25 0506)  Labs: Recent Labs    12/21/17 1309 12/22/17 0544 12/23/17 0444 12/24/17 0420  HGB 10.5* 10.8* 10.6* 12.7  HCT 32.6* 34.5* 33.3* 39.8  PLT 257 225 221 278  LABPROT  --   --   --  16.1*  INR  --   --   --  1.31  HEPARINUNFRC  --   --   --  0.48  CREATININE 13.27* 7.02* 9.19*  --   CKTOTAL 46  --   --   --     Estimated Creatinine Clearance: 4.6 mL/min (A) (by C-G formula based on SCr of 9.19 mg/dL (H)).    Assessment: 66 YOF with hx of ESRD on HD, admitted for R leg pain, LE doppler showed questionable acute DVT in RLE, pharmacy is consulted to start heparin and warfarin. hgb 10.6, pltc 221K.  No baseline INR  Heparin level this am therapeutic, INR 1.31  Goal of Therapy:  INR 2-3 Heparin level 0.3-0.7 units/ml Monitor platelets by anticoagulation protocol: Yes   Plan:  - Heparin infusion 1050  units/hr - f/u 8 hr heparin level to confirm - coumadin 5mg  po x 1 today  - daily heparin level and CBC  Thanks for allowing pharmacy to be a part of this patient's care.  Excell Seltzer, PharmD Clinical Pharmacist  12/24/2017,5:25 AM

## 2017-12-24 NOTE — Progress Notes (Signed)
New IV site started R posterior forearm, near wrist. Site near shoulder found to be bleeding, pt reported tenderness at site. Site DC'd. Instructed pt's nurse to enter consult if second site is needed. Will likely need ultrasound.

## 2017-12-24 NOTE — Progress Notes (Signed)
[facility]                                                                              Patient Demographics  [name], is a 77 y.o. female, DOB - 08-06-1940, EHU:[id number]  Admit date - 12/21/2017   Admitting Physician Reyne Dumas, MD  Outpatient Primary MD for the patient is [place], Malka So, MD  Outpatient specialists:   LOS - 1  days   Medical records reviewed and are as summarized below:    Chief Complaint  Patient presents with  . Leg Pain       Brief summary   Patient is a 77 year old female with COPD, hypertension, ESRD on hemodialysis MWF, depression, chronic anemia, IBS, restless leg syndrome, PAD, neuropathy status post right superficial femoral artery stenting by Dr. Lucky Cowboy on 12/17/2017 presented with severe right lower extremity pain, sharp, intermittent, progressing since last Thursday 5 days ago.  States tramadol did not help, unable to stand or get a wheelchair hence was unable to go to dialysis due to pain.   Assessment & Plan    Principal Problem:   Right leg pain in the setting of PAD, chronic peripheral neuropathy, recent right superficial femoral artery stenting -No evidence of hematoma, Doppler ultrasound final read was negative for DVT, read by Dr. Trula Slade. Patient was seen by Dr. Donnetta Hutching in the ED on 7/22 and did not feel patient had active vascular disease.   -Patient continued to complained of RLE pain, hence vascular surgery was reconsulted, seen by Dr. Donnetta Hutching on 7/24, recommended that patient does have a DVT in RLE anticoagulation for 3 months. -Started on IV heparin drip and Coumadin (per nephrology, no NOAC or lovenox), INR subtherapeutic  Active Problems: Acute abdominal pain likely due to severe constipation - At the time of examination, patient moaning with pain, states has not had BM in the last 1 to 2 weeks.  States has IBS and frequently gets constipated -Stat abdominal x-ray showed large amount of colonic and rectal stool,  no bowel obstruction -For now placed on clear liquid diet and smog enema.  Continue Senokot S and Dulcolax suppository  ESRD on hemodialysis, MWF, hyperkalemia -Last HD on 7/17, states missed hemodialysis due to pain -Nephrology consulted, patient started on hemodialysis per schedule  Mild acute metabolic encephalopathy -Per son at the bedside, patient was confused at home, at times hallucinating, symptoms started after she was taking narcotics for the pain -Currently alert and oriented, uncomfortable with pain    HYPERCHOLESTEROLEMIA -Continue statins     Gout, unspecified Continue allopurinol  Anemia of chronic disease/CKD -H&H currently stable     Anxiety and depression -Continue Cymbalta    GASTROESOPHAGEAL REFLUX, NO ESOPHAGITIS -Continue PPI  Diastolic chronic CHF (congestive heart failure) ([place]) -Prior echo in 07/24/2017 showed EF of 55 to 78%, grade 2 diastolic dysfunction -Currently stable, volume management with hemodialysis  Essential hypertension -BP stable, continue clonidine, amlodipine, beta-blocker.      Chronic obstructive pulmonary disease (HCC) -Stable, no wheezing    PAD (peripheral artery disease) (HCC) -Continue Plavix  Code Status: Full CODE STATUS DVT Prophylaxis: Heparin subcu Family Communication: Discussed in  detail with the patient, all imaging results, lab results explained to the patient and daughter at the bedside   Disposition Plan:  Time Spent in minutes   35 minutes  Procedures:  HD Doppler ultrasound lower extremity Consultants:   Nephrology Vascular surgery  Antimicrobials:      Medications  Scheduled Meds: . allopurinol  100 mg Oral Once per day on Sun Tue Thu Sat  . amLODipine  10 mg Oral QHS  . atorvastatin  40 mg Oral QHS  . Chlorhexidine Gluconate Cloth  6 each Topical Q0600  . Chlorhexidine Gluconate Cloth  6 each Topical Q0600  . [START ON 12/25/2017] Chlorhexidine Gluconate Cloth  6 each Topical Q0600  .  cinacalcet  90 mg Oral QHS  . cloNIDine  0.2 mg Oral Daily  . clopidogrel  75 mg Oral Daily  . dicyclomine  10 mg Oral TID AC & HS  . doxercalciferol  1 mcg Intravenous Q M,W,F-HD  . DULoxetine  60 mg Oral QHS  . feeding supplement (NEPRO CARB STEADY)  237 mL Oral BID BM  . glycopyrrolate  2 mg Oral BID  . lidocaine-prilocaine  1 application Topical Once per day on Mon Wed Fri  . metoprolol succinate  100 mg Oral BID  . multivitamin  1 tablet Oral QHS  . pantoprazole  20 mg Oral BID AC  . pregabalin  100 mg Oral QHS  . senna-docusate  1 tablet Oral BID  . sevelamer carbonate  2,400 mg Oral TID WC  . sorbitol, milk of mag, mineral oil, glycerin (SMOG) enema  960 mL Rectal Once  . warfarin  5 mg Oral ONCE-1800  . Warfarin - Pharmacist Dosing Inpatient   Does not apply q1800   Continuous Infusions: . heparin 1,050 Units/hr (12/24/17 1224)   PRN Meds:.acetaminophen **OR** acetaminophen, azelastine, bisacodyl, fentaNYL (SUBLIMAZE) injection, hydrOXYzine, LORazepam, ondansetron **OR** ondansetron (ZOFRAN) IV   Antibiotics   Anti-infectives (From admission, onward)   None        Subjective:   [name] was seen and examined today.  Moaning with abdominal pain, states has not had a BM in the last 1 to 2 weeks.  States no pain in the right lower extremity.   patient denies dizziness, chest pain, shortness of breath.  Objective:   Vitals:   12/23/17 2104 12/24/17 0506 12/24/17 0922 12/24/17 1122  BP: (!) 146/48 (!) 128/41 (!) 119/48 (!) 130/56  Pulse: 98 91 84 83  Resp: 18 16 18    Temp: 98.4 F (36.9 C) 97.6 F (36.4 C) 98 F (36.7 C)   TempSrc: Oral  Oral   SpO2: 98% 93% 99% 96%  Weight: 69 kg (152 lb 1.9 oz)     Height:        Intake/Output Summary (Last 24 hours) at 12/24/2017 1258 Last data filed at 12/24/2017 0900 Gross per 24 hour  Intake 437.56 ml  Output 0 ml  Net 437.56 ml     Wt Readings from Last 3 Encounters:  12/23/17 69 kg (152 lb 1.9 oz)    12/17/17 71.2 kg (157 lb)  12/15/17 71.2 kg (157 lb)     Exam    General: Alert and oriented x 3, NAD, uncomfortable with pain  Eyes:   HEENT:  Atraumatic, normocephalic, normal oropharynx  Cardiovascular: S1 S2 clear, RRR. No pedal edema b/l  Respiratory: Clear to auscultation bilaterally, no wheezing, rales or rhonchi  Gastrointestinal: Soft, mild diffuse tenderness,+ bowel sounds  Ext: no pedal edema bilaterally  Neuro: no new deficits  Musculoskeletal: No digital cyanosis, clubbing  Skin: No rashes  Psych: uncomfortable with abdominal pain    Data Reviewed:  I have personally reviewed following labs and imaging studies  Micro Results Recent Results (from the past 240 hour(s))  MRSA PCR Screening     Status: None   Collection Time: 12/21/17  6:12 PM  Result Value Ref Range Status   MRSA by PCR NEGATIVE NEGATIVE Final    Comment:        The GeneXpert MRSA Assay (FDA approved for NASAL specimens only), is one component of a comprehensive MRSA colonization surveillance program. It is not intended to diagnose MRSA infection nor to guide or monitor treatment for MRSA infections. Performed at [facility], [place] [address], [place], [place] [address]     Radiology Reports US Arterial Seg Multiple  Result Date: 11/24/2017 CLINICAL DATA:  77 year old female with a history of PAD. Cardiovascular risk factors include smoking, hypertension, hyperlipidemia, known vascular surgery, renal failure EXAM: NONINVASIVE PHYSIOLOGIC VASCULAR STUDY OF BILATERAL LOWER EXTREMITIES TECHNIQUE: Evaluation of both lower extremities was performed at rest, including calculation of ankle-brachial indices, multiple segmental pressure evaluation, segmental Doppler and segmental pulse volume recording. COMPARISON:  No prior duplex FINDINGS: Right: Resting ankle brachial index:  0.37 Segmental blood pressure: Right upper extremity pressure 710 systolic. Decrease to the right thigh.  Significant drop to the low thigh. Ankle pressure measures 31 systolic. Doppler: Segmental Doppler of the right lower extremity demonstrates monophasic waveform throughout. Pulse volume recording: Segmental PVR of the right lower extremity demonstrates loss of augmentation below knee with deterioration of the waveform throughout. Left: Resting ankle brachial index: 0.70 Segmental blood pressure: Segmental blood pressure of the left arm was not measured. Asymmetric pressure in the thigh. Significant drop below knee. Doppler: Segmental Doppler of the left lower extremity monophasic throughout. Pulse volume recording: Segmental PVR of the left lower extremity demonstrates waveform amplitude maintained with no deterioration at the ankle. 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 Segmental exam demonstrates multi segment disease including at least iliac and femoropopliteal disease. Left: Resting ABI in the mild range of arterial occlusive disease. This may be falsely elevated given the appearance of the segmental exam. Segmental exam demonstrates multi segment disease including at least iliac and femoropopliteal disease. Signed, Dulcy Fanny. Dellia Nims, RPVI [facility] Electronically Signed   By: Corrie Mckusick D.O.   On: 11/24/2017 15:03   Dg Chest Port 1 View  Result Date: 12/21/2017 CLINICAL DATA:  77 year old female with acute chest pain today. EXAM: PORTABLE CHEST 1 VIEW COMPARISON:  08/20/2017 and earlier. FINDINGS: Portable AP semi upright view at 0743 hours. Left side HERO graft hemodialysis access redemonstrated. Visible portions appear stable. Chronic elevation of the right hemidiaphragm is stable. Stable mild cardiomegaly and mediastinal contours. No pneumothorax, pulmonary edema, pleural effusion or consolidation. Mild bibasilar atelectasis appears stable. Paucity of bowel gas in the upper abdomen.  Visualized tracheal air column is within normal limits. No acute osseous abnormality identified. IMPRESSION: Chronic bibasilar atelectasis. No acute cardiopulmonary abnormality. Electronically Signed   By: Genevie Ann M.D.   On: 12/21/2017 08:04   Dg Abd 2 Views  Result Date: 12/24/2017 CLINICAL DATA:  Abdominal pain. EXAM: ABDOMEN - 2 VIEW COMPARISON:  CT abdomen and pelvis 08/20/2017 FINDINGS: No intraperitoneal free air is identified. There is stool throughout the colon, with a large amount of stool noted in the  ascending colon and rectum. No dilated loops of bowel are seen to suggest obstruction. Atherosclerotic vascular calcifications and pelvic surgical clips are noted. A HERO graft is noted in the chest. Bibasilar lung opacities are similar to a recent chest radiograph from 12/21/2017 and likely represent atelectasis. IMPRESSION: Large amount of colonic and rectal stool. No evidence of bowel obstruction. Electronically Signed   By: Logan Bores M.D.   On: 12/24/2017 10:01    Lab Data:  CBC: Recent Labs  Lab 12/21/17 0840 12/21/17 0847 12/21/17 1309 12/22/17 0544 12/23/17 0444 12/24/17 0420  WBC 12.4*  --  14.1* 11.4* 8.3 11.5*  NEUTROABS 9.3*  --   --   --   --   --   HGB 10.8* 11.2* 10.5* 10.8* 10.6* 12.7  HCT 33.7* 33.0* 32.6* 34.5* 33.3* 39.8  MCV 90.3  --  89.3 89.8 90.0 89.4  PLT 215  --  257 225 221 836   Basic Metabolic Panel: Recent Labs  Lab 12/21/17 0847 12/21/17 1309 12/22/17 0544 12/23/17 0444 12/24/17 0420  NA 130* 136 138 137 136  K 7.0* 5.8* 4.6 5.4* 4.4  CL 100 95* 97* 95* 94*  CO2  --  24 28 27 28   GLUCOSE 69* 37* 74 86 81  BUN 58* 61* 20 30* 14  CREATININE 14.50* 13.27* 7.02* 9.19* 5.95*  CALCIUM  --  8.2* 8.1* 8.3* 9.0  PHOS  --  7.7*  --   --   --    GFR: Estimated Creatinine Clearance: 7.1 mL/min (A) (by C-G formula based on SCr of 5.95 mg/dL (H)). Liver Function Tests: Recent Labs  Lab 12/21/17 0840 12/21/17 1309  AST 19  --   ALT 10  --     ALKPHOS 82  --   BILITOT 0.6  --   PROT 7.2  --   ALBUMIN 2.6* 2.5*   No results for input(s): LIPASE, AMYLASE in the last 168 hours. No results for input(s): AMMONIA in the last 168 hours. Coagulation Profile: Recent Labs  Lab 12/24/17 0420  INR 1.31   Cardiac Enzymes: Recent Labs  Lab 12/21/17 1309  CKTOTAL 46   BNP (last 3 results) No results for input(s): PROBNP in the last 8760 hours. HbA1C: No results for input(s): HGBA1C in the last 72 hours. CBG: Recent Labs  Lab 12/21/17 1045 12/21/17 1107 12/21/17 1129 12/21/17 1352 12/21/17 1601  GLUCAP 119* 117* 80 70 107*   Lipid Profile: No results for input(s): CHOL, HDL, LDLCALC, TRIG, CHOLHDL, LDLDIRECT in the last 72 hours. Thyroid Function Tests: No results for input(s): TSH, T4TOTAL, FREET4, T3FREE, THYROIDAB in the last 72 hours. Anemia Panel: No results for input(s): VITAMINB12, FOLATE, FERRITIN, TIBC, IRON, RETICCTPCT in the last 72 hours. Urine analysis:    Component Value Date/Time   COLORURINE YELLOW 12/30/2014 1419   APPEARANCEUR CLEAR 12/30/2014 1419   LABSPEC 1.009 12/30/2014 1419   PHURINE 7.5 12/30/2014 1419   GLUCOSEU NEGATIVE 12/30/2014 1419   HGBUR NEGATIVE 12/30/2014 1419   HGBUR negative 03/23/2007 1112   BILIRUBINUR NEGATIVE 12/30/2014 1419   KETONESUR NEGATIVE 12/30/2014 1419   PROTEINUR 100 (A) 12/30/2014 1419   UROBILINOGEN 0.2 12/30/2014 1419   NITRITE NEGATIVE 12/30/2014 1419   LEUKOCYTESUR NEGATIVE 12/30/2014 1419     Wesly Whisenant M.D. [facility] 12/24/2017, 12:58 PM  Pager: [phone] Between 7am to 7pm - call Pager - [phone]  After 7pm go to [email or web address] - password [email or web address]  Call night coverage person covering after 7pm

## 2017-12-25 ENCOUNTER — Inpatient Hospital Stay (HOSPITAL_COMMUNITY): Payer: Medicare Other

## 2017-12-25 DIAGNOSIS — M79604 Pain in right leg: Secondary | ICD-10-CM

## 2017-12-25 LAB — CBC
HEMATOCRIT: 35.1 % — AB (ref 36.0–46.0)
HEMOGLOBIN: 11.1 g/dL — AB (ref 12.0–15.0)
MCH: 28.5 pg (ref 26.0–34.0)
MCHC: 31.6 g/dL (ref 30.0–36.0)
MCV: 90.2 fL (ref 78.0–100.0)
Platelets: 252 10*3/uL (ref 150–400)
RBC: 3.89 MIL/uL (ref 3.87–5.11)
RDW: 16.4 % — ABNORMAL HIGH (ref 11.5–15.5)
WBC: 11.9 10*3/uL — AB (ref 4.0–10.5)

## 2017-12-25 LAB — RENAL FUNCTION PANEL
Albumin: 2.4 g/dL — ABNORMAL LOW (ref 3.5–5.0)
Anion gap: 15 (ref 5–15)
BUN: 28 mg/dL — ABNORMAL HIGH (ref 8–23)
CALCIUM: 8.7 mg/dL — AB (ref 8.9–10.3)
CO2: 27 mmol/L (ref 22–32)
CREATININE: 8.98 mg/dL — AB (ref 0.44–1.00)
Chloride: 92 mmol/L — ABNORMAL LOW (ref 98–111)
GFR calc Af Amer: 4 mL/min — ABNORMAL LOW (ref >60)
GFR, EST NON AFRICAN AMERICAN: 4 mL/min — AB (ref >60)
Glucose, Bld: 84 mg/dL (ref 70–99)
Phosphorus: 7.5 mg/dL — ABNORMAL HIGH (ref 2.5–4.6)
Potassium: 4.6 mmol/L (ref 3.5–5.1)
SODIUM: 134 mmol/L — AB (ref 135–145)

## 2017-12-25 LAB — BASIC METABOLIC PANEL
Anion gap: 18 — ABNORMAL HIGH (ref 5–15)
BUN: 31 mg/dL — ABNORMAL HIGH (ref 8–23)
CHLORIDE: 92 mmol/L — AB (ref 98–111)
CO2: 25 mmol/L (ref 22–32)
Calcium: 8.7 mg/dL — ABNORMAL LOW (ref 8.9–10.3)
Creatinine, Ser: 8.52 mg/dL — ABNORMAL HIGH (ref 0.44–1.00)
GFR calc non Af Amer: 4 mL/min — ABNORMAL LOW (ref >60)
GFR, EST AFRICAN AMERICAN: 5 mL/min — AB (ref >60)
Glucose, Bld: 72 mg/dL (ref 70–99)
POTASSIUM: 4.1 mmol/L (ref 3.5–5.1)
Sodium: 135 mmol/L (ref 135–145)

## 2017-12-25 LAB — PROTIME-INR
INR: 1.54
Prothrombin Time: 18.3 seconds — ABNORMAL HIGH (ref 11.4–15.2)

## 2017-12-25 LAB — HEPARIN LEVEL (UNFRACTIONATED): HEPARIN UNFRACTIONATED: 0.57 [IU]/mL (ref 0.30–0.70)

## 2017-12-25 MED ORDER — HEPARIN SODIUM (PORCINE) 1000 UNIT/ML DIALYSIS: 1000.0000 [IU] | INTRAMUSCULAR | Status: DC | PRN

## 2017-12-25 MED ORDER — DOXERCALCIFEROL 4 MCG/2ML IV SOLN
INTRAVENOUS | Status: AC
  Administered 2017-12-25: 1 ug via INTRAVENOUS
  Filled 2017-12-25: qty 2

## 2017-12-25 MED ORDER — LIDOCAINE HCL (PF) 1 % IJ SOLN: 5.0000 mL | INTRAMUSCULAR | Status: DC | PRN

## 2017-12-25 MED ORDER — PENTAFLUOROPROP-TETRAFLUOROETH EX AERO: 1.0000 "application " | INHALATION_SPRAY | CUTANEOUS | Status: DC | PRN

## 2017-12-25 MED ORDER — SODIUM CHLORIDE 0.9 % IV SOLN: 100.0000 mL | INTRAVENOUS | Status: DC | PRN

## 2017-12-25 MED ORDER — COUMADIN BOOK
Freq: Once | Status: AC
  Administered 2017-12-25: 18:00:00
  Filled 2017-12-25 (×3): qty 1

## 2017-12-25 MED ORDER — BISACODYL 5 MG PO TBEC
10.0000 mg | DELAYED_RELEASE_TABLET | Freq: Once | ORAL | Status: AC
  Administered 2017-12-25: 5 mg via ORAL
  Filled 2017-12-25 (×2): qty 2

## 2017-12-25 MED ORDER — LIDOCAINE-PRILOCAINE 2.5-2.5 % EX CREA: 1.0000 "application " | TOPICAL_CREAM | CUTANEOUS | Status: DC | PRN

## 2017-12-25 MED ORDER — ALTEPLASE 2 MG IJ SOLR: 2.0000 mg | Freq: Once | INTRAMUSCULAR | Status: DC | PRN

## 2017-12-25 MED ORDER — LACTULOSE 10 GM/15ML PO SOLN: 20.0000 g | ORAL | Status: DC

## 2017-12-25 MED ORDER — WARFARIN SODIUM 5 MG PO TABS
5.0000 mg | ORAL_TABLET | Freq: Once | ORAL | Status: AC
  Administered 2017-12-25: 5 mg via ORAL
  Filled 2017-12-25: qty 1

## 2017-12-25 NOTE — Clinical Social Work Note (Addendum)
Clinical Social Work Assessment  Patient Details  Name: [name] MRN: [id number] Date of Birth: 1940-11-20  Date of referral:  12/23/17               Reason for consult:  Discharge Planning, Facility Placement                Permission sought to share information with:  Family Supports Permission granted to share information::  Yes, Verbal Permission Granted  Name::     [name]   Agency::     Relationship::  Daughter  Contact Information:  [phone]  Housing/Transportation Living arrangements for the past 2 months:  [place] of Information:  Adult Children Patient Interpreter Needed:  None Criminal Activity/Legal Involvement Pertinent to Current Situation/Hospitalization:  No - Comment as needed Significant Relationships:  Adult Children Lives with:  Adult Children(Daughter [name]) Do you feel safe going back to the place where you live?  Yes Need for family participation in patient care:  Yes (Comment)  Care giving concerns:  Daughter reported that she and her mother live together and that she is a Pharmacist, hospital. Daughter informed CSW that her mother has an aide who comes daily for 2 hours. In discussing discharge disposition, CSW informed daughter of PT recommendation of [facility] Program and daughter expressed interest in talking with someone regarding the program.   Social Worker assessment / plan: On 12/24/17 CSW visited with patient to discuss her discharge disposition and she was visibly in pain and also acknowledged that she was in pain. When asked, [name] gave CSW permission to contact her daughter [name]. Daughter contacted and informed of PT recommendation. Daughter informed CSW that she was present during the evaluation and expressed concern regarding the validity of the PT evaluation, as she does not feel that her mother was thoroughly evaluated.  Daughter provided with recommendation of SNF or home, possibly with the [facility]. Daughter expressed interest in talking with someone from [place] before making a decision on her mother's discharge plan. [name] advised that she will be contacted regarding the Home First program.   Employment status:  Retired Forensic scientist:  Information systems manager, Medicaid In [place] PT Recommendations:  Home with [place], [place], [place] / Referral to community resources:  Other (Comment Required)(Daughter requested to talk with someone from [facility] regarding Home First program)  Patient/Family's Response to care: Daughter expressed no concerns regarding her mother's care during hospitalization.     Patient/Family's Understanding of and Emotional Response to Diagnosis, Current Treatment, and Prognosis:  Daughter appeared knowledgeable regarding her mother's health issues and appeared concerned regarding her getting the care she needs post discharge.  Emotional Assessment Appearance:  Appears stated age Attitude/Demeanor/Rapport:  Lethargic Affect (typically observed):  Other(Patient was in pain and asked that daughter be contacted) Orientation:  Oriented to Self, Oriented to Place, Oriented to  Time, Oriented to Situation, Fluctuating Orientation (Suspected and/or reported Sundowners) Alcohol / Substance use:  Tobacco Use, Alcohol Use, Illicit Drugs(Per H&P patient reported that she smokes and does not drink or use illicit drugs) Psych involvement (Current and /or in the community):  No (Comment)  Discharge Needs  Concerns to be addressed:  Discharge Planning Concerns Readmission within the last 30 days:  Yes Current discharge risk:  None Barriers to Discharge:  Continued Medical Work up   [employer] Brianna Homer, LCSW 12/25/2017, 1:42 PM

## 2017-12-25 NOTE — Progress Notes (Signed)
PROGRESS NOTE    [name]  IRJ:[id number] DOB: 11-08-1940 DOA: 12/21/2017 PCP: Martin[place], Betty G, MD  Brief Narrative: 77 year old female with COPD, hypertension, ESRD on hemodialysis MWF, depression, chronic anemia, IBS, restless leg syndrome, PAD, neuropathy status post right superficial femoral artery stenting by Dr. Lucky Cowboy on 12/17/2017 presented with severe right lower extremity pain, sharp, intermittent, progressing since last Thursday 5 days ago.  States tramadol did not help, unable to stand or get a wheelchair hence was unable to go to dialysis due to pain.     Assessment & Plan:   Principal Problem:   Right leg pain Active Problems:   HYPERCHOLESTEROLEMIA   Gout, unspecified   ANEMIA NEC   Anxiety and depression   TOBACCO ABUSE   GASTROESOPHAGEAL REFLUX, NO ESOPHAGITIS   CHF (congestive heart failure) (HCC)   Hypertension   Hyperkalemia   ESRD on dialysis [facility])   Chronic obstructive pulmonary disease (HCC)   PAD (peripheral artery disease) (HCC)  Right leg pain in the setting of PAD, chronic peripheral neuropathy, recent right superficial femoral artery stenting -No evidence of hematoma, Doppler ultrasound final read was negative for DVT, read by Dr. Trula Slade. Patient was seen by Dr. Donnetta Hutching in the ED on 7/22 and did not feel patient had active vascular disease.   -Patient continued to complained of RLE pain, hence vascular surgery was reconsulted, seen by Dr. Donnetta Hutching on 7/24, recommended that patient does have a DVT in RLE anticoagulation for 3 months. -Started on IV heparin drip and Coumadin (per nephrology, no NOAC or lovenox), pharmacy following.  Active Problems: Acute abdominal pain likely due to severe constipation-patient had large bowel movement yesterday currently she is free of abdominal pain and feeling and asking for regular food. -ESRD on hemodialysis, MWF, hyperkalemia -Last HD on 7/17, states missed hemodialysis due to pain -Nephrology consulted, patient  started on hemodialysis per schedule  Mild acute metabolic encephalopathy -Per son at the bedside, patient was confused at home, at times hallucinating, symptoms started after she was taking narcotics for the pain -Currently alert and oriented, uncomfortable with pain    HYPERCHOLESTEROLEMIA -Continue statins     Gout, unspecified Continue allopurinol  Anemia of chronic disease/CKD -H&H currently stable     Anxiety and depression -Continue Cymbalta    GASTROESOPHAGEAL REFLUX, NO ESOPHAGITIS -Continue PPI  Diastolic chronic CHF (congestive heart failure) ([place]) -Prior echo in 07/24/2017 showed EF of 55 to 30%, grade 2 diastolic dysfunction -Currently stable, volume management with hemodialysis  Essential hypertension -BP stable, continue clonidine, amlodipine, beta-blocker.      Chronic obstructive pulmonary disease (HCC) -Stable, no wheezing   PAD (peripheral artery disease) (HCC) -Continue Plavix        DVT prophylaxis:heparin Code Status:full Family Communication: none Disposition Plan:  Hope to discharge her to skilled nursing facility tomorrow  Consultants: nephro,vascular  Procedures:hd lower ext ultrasound Antimicrobials: None  Subjective: Resting in bed having had a breakfast does not like clear liquids denies any pain no nausea vomiting had a bowel movement last night.  Objective: Vitals:   12/24/17 2141 12/25/17 0641 12/25/17 0642 12/25/17 0912  BP: (!) 129/45 (!) 97/35 (!) 114/31 (!) 123/44  Pulse: 79 73 72 73  Resp: 16 18 18 18   Temp: 97.6 F (36.4 C) 98.2 F (36.8 C) 98.2 F (36.8 C) 97.8 F (36.6 C)  TempSrc: Oral Oral Oral Oral  SpO2: 100% 98% 98% 94%  Weight:      Height:  Intake/Output Summary (Last 24 hours) at 12/25/2017 1312 Last data filed at 12/25/2017 0900 Gross per 24 hour  Intake 576.57 ml  Output 0 ml  Net 576.57 ml   Filed Weights   12/23/17 0800 12/23/17 1151 12/23/17 2104  Weight: 72.3 kg (159 lb  6.3 oz) 69 kg (152 lb 1.9 oz) 69 kg (152 lb 1.9 oz)    Examination:  General exam: Appears calm and comfortable  Respiratory system: Decreased breath sounds at bases to auscultation. Respiratory effort normal. Cardiovascular system: S1 & S2 heard, RRR. No JVD, murmurs, rubs, gallops or clicks. No pedal edema. Gastrointestinal system: Abdomen is nondistended, soft and nontender. No organomegaly or masses felt. Normal bowel sounds heard. Central nervous system: Alert and oriented. No focal neurological deficits. Extremities:trace edema Skin: No rashes, lesions or ulcers Psychiatry: Judgement and insight appear normal. Mood & affect appropriate.     Data Reviewed: I have personally reviewed following labs and imaging studies  CBC: Recent Labs  Lab 12/21/17 0840  12/21/17 1309 12/22/17 0544 12/23/17 0444 12/24/17 0420 12/25/17 0639  WBC 12.4*  --  14.1* 11.4* 8.3 11.5* 11.9*  NEUTROABS 9.3*  --   --   --   --   --   --   HGB 10.8*   < > 10.5* 10.8* 10.6* 12.7 11.1*  HCT 33.7*   < > 32.6* 34.5* 33.3* 39.8 35.1*  MCV 90.3  --  89.3 89.8 90.0 89.4 90.2  PLT 215  --  257 225 221 278 252   < > = values in this interval not displayed.   Basic Metabolic Panel: Recent Labs  Lab 12/21/17 1309 12/22/17 0544 12/23/17 0444 12/24/17 0420 12/25/17 0639  NA 136 138 137 136 135  K 5.8* 4.6 5.4* 4.4 4.1  CL 95* 97* 95* 94* 92*  CO2 24 28 27 28 25   GLUCOSE 37* 74 86 81 72  BUN 61* 20 30* 14 31*  CREATININE 13.27* 7.02* 9.19* 5.95* 8.52*  CALCIUM 8.2* 8.1* 8.3* 9.0 8.7*  PHOS 7.7*  --   --   --   --    GFR: Estimated Creatinine Clearance: 5 mL/min (A) (by C-G formula based on SCr of 8.52 mg/dL (H)). Liver Function Tests: Recent Labs  Lab 12/21/17 0840 12/21/17 1309  AST 19  --   ALT 10  --   ALKPHOS 82  --   BILITOT 0.6  --   PROT 7.2  --   ALBUMIN 2.6* 2.5*   No results for input(s): LIPASE, AMYLASE in the last 168 hours. No results for input(s): AMMONIA in the last 168  hours. Coagulation Profile: Recent Labs  Lab 12/24/17 0420 12/25/17 0639  INR 1.31 1.54   Cardiac Enzymes: Recent Labs  Lab 12/21/17 1309  CKTOTAL 46   BNP (last 3 results) No results for input(s): PROBNP in the last 8760 hours. HbA1C: No results for input(s): HGBA1C in the last 72 hours. CBG: Recent Labs  Lab 12/21/17 1045 12/21/17 1107 12/21/17 1129 12/21/17 1352 12/21/17 1601  GLUCAP 119* 117* 80 70 107*   Lipid Profile: No results for input(s): CHOL, HDL, LDLCALC, TRIG, CHOLHDL, LDLDIRECT in the last 72 hours. Thyroid Function Tests: No results for input(s): TSH, T4TOTAL, FREET4, T3FREE, THYROIDAB in the last 72 hours. Anemia Panel: No results for input(s): VITAMINB12, FOLATE, FERRITIN, TIBC, IRON, RETICCTPCT in the last 72 hours. Sepsis Labs: Recent Labs  Lab 12/21/17 1309  PROCALCITON 0.80    Recent Results (from the past 240 hour(s))  MRSA PCR Screening     Status: None   Collection Time: 12/21/17  6:12 PM  Result Value Ref Range Status   MRSA by PCR NEGATIVE NEGATIVE Final    Comment:        The GeneXpert MRSA Assay (FDA approved for NASAL specimens only), is one component of a comprehensive MRSA colonization surveillance program. It is not intended to diagnose MRSA infection nor to guide or monitor treatment for MRSA infections. Performed at [facility], [place] [address], [place], [place] [address]          Radiology Studies: Dg Abd 2 Views  Result Date: 12/24/2017 CLINICAL DATA:  Abdominal pain. EXAM: ABDOMEN - 2 VIEW COMPARISON:  CT abdomen and pelvis 08/20/2017 FINDINGS: No intraperitoneal free air is identified. There is stool throughout the colon, with a large amount of stool noted in the ascending colon and rectum. No dilated loops of bowel are seen to suggest obstruction. Atherosclerotic vascular calcifications and pelvic surgical clips are noted. A HERO graft is noted in the chest. Bibasilar lung opacities are similar to a  recent chest radiograph from 12/21/2017 and likely represent atelectasis. IMPRESSION: Large amount of colonic and rectal stool. No evidence of bowel obstruction. Electronically Signed   By: Logan Bores M.D.   On: 12/24/2017 10:01        Scheduled Meds: . allopurinol  100 mg Oral Once per day on Sun Tue Thu Sat  . amLODipine  10 mg Oral QHS  . atorvastatin  40 mg Oral QHS  . cinacalcet  90 mg Oral QHS  . clopidogrel  75 mg Oral Daily  . coumadin book   Does not apply Once  . dicyclomine  10 mg Oral TID AC & HS  . doxercalciferol  1 mcg Intravenous Q M,W,F-HD  . DULoxetine  60 mg Oral QHS  . feeding supplement (NEPRO CARB STEADY)  237 mL Oral BID BM  . glycopyrrolate  2 mg Oral BID  . lidocaine-prilocaine  1 application Topical Once per day on Mon Wed Fri  . metoprolol succinate  100 mg Oral BID  . multivitamin  1 tablet Oral QHS  . pantoprazole  20 mg Oral BID AC  . pregabalin  100 mg Oral QHS  . senna-docusate  1 tablet Oral BID  . sevelamer carbonate  2,400 mg Oral TID WC  . warfarin  5 mg Oral ONCE-1800  . Warfarin - Pharmacist Dosing Inpatient   Does not apply q1800   Continuous Infusions: . sodium chloride    . sodium chloride    . heparin 1,050 Units/hr (12/25/17 0600)     LOS: 2 days     Georgette Shell, MD [facility]  If 7PM-7AM, please contact night-coverage [email or web address] Password [email or web address] 12/25/2017, 1:12 PM

## 2017-12-25 NOTE — Progress Notes (Signed)
Physical Therapy Treatment Patient Details Name: [name] MRN: [id number] DOB: [birth year] Today's Date: 12/25/2017    History of Present Illness [name] is a 77 y.o. female with extensive medical history listed below, including COPD, hypertension, CHF, ESRD on hemodialysis Monday Wednesday and Friday, history of major depression, chronic anemia, IBS, restless leg syndrome, chronic peripheral neuropathy, peripheral vascular disease-peripheral arterial disease, status post right superficial femoral artery stenting by Dr. Lucky Cowboy on 12/17/2017, presenting with severe right lower extremity pain, sharp, "comes in bouts ", progressing since last Thursday, extending from the toes to the right groin.  The patient reports having neuropathy, and admits to be very hypersensitive to touch.  At home, she took tramadol, but did not seem to help, last taken at 3 AM today.  The left leg has also some pain, but not as severe as on the right.  She was unable to stand or getting a wheelchair on Friday, and was unable to go to dialysis due to the symptoms. Doppler ultrasound negative for acute DVT.    PT Comments    Patient with significant improvement in functional mobility compared to last session secondary to improved right leg pain control. Able to ambulate 30 feet with walker and up to minimal assistance for balance. Somewhat lethargic through session likely due to pain medication. Continue to recommend SNF to decrease caregiver burden and maximize functional independence but will reassess as patient progresses.    Follow Up Recommendations  SNF;Supervision/Assistance - 24 hour     Equipment Recommendations  None recommended by PT    Recommendations for Other Services       Precautions / Restrictions Precautions Precautions: Fall Restrictions Weight Bearing Restrictions: No    Mobility  Bed Mobility Overal bed mobility: Needs Assistance Bed Mobility: Supine to Sit     Supine to sit:  Supervision     General bed mobility comments: supervision for safety with increased time  Transfers Overall transfer level: Needs assistance Equipment used: Rolling walker (2 wheeled) Transfers: Sit to/from Stand Sit to Stand: Supervision            Ambulation/Gait Ambulation/Gait assistance: Min guard;Min assist Gait Distance (Feet): 30 Feet Assistive device: Rolling walker (2 wheeled) Gait Pattern/deviations: Step-through pattern;Narrow base of support;Decreased stride length Gait velocity: decreased Gait velocity interpretation: <1.31 ft/sec, indicative of household ambulator General Gait Details: Patient with slow gait and required directional cueing for walker steering. one minor posterior loss of balance requiring min assist to correct back to upright.    Stairs             Wheelchair Mobility    Modified Rankin (Stroke Patients Only)       Balance Overall balance assessment: Needs assistance Sitting-balance support: No upper extremity supported;Feet supported Sitting balance-Leahy Scale: Good     Standing balance support: Bilateral upper extremity supported;During functional activity Standing balance-Leahy Scale: Poor                              Cognition Arousal/Alertness: Suspect due to medications Behavior During Therapy: WFL for tasks assessed/performed Overall Cognitive Status: Impaired/Different from baseline Area of Impairment: Orientation;Following commands;Safety/judgement;Awareness;Problem solving;Attention;Memory                 Orientation Level: Disoriented to;Time Current Attention Level: Sustained Memory: Decreased short-term memory Following Commands: Follows one step commands consistently Safety/Judgement: Decreased awareness of deficits Awareness: Intellectual Problem Solving: Slow processing General Comments: Patient lethargic  throughout session likely secondary to pain medication. More pleasant and  agreeable this session compared to last session.      Exercises      General Comments        Pertinent Vitals/Pain Pain Assessment: Faces Faces Pain Scale: Hurts little more Pain Location: right leg, groin Pain Descriptors / Indicators: Grimacing;Guarding Pain Intervention(s): Monitored during session;Premedicated before session    Home Living                      Prior Function            PT Goals (current goals can now be found in the care plan section) Acute Rehab PT Goals Patient Stated Goal: go home PT Goal Formulation: With patient Time For Goal Achievement: 01/06/18 Potential to Achieve Goals: Fair Progress towards PT goals: Progressing toward goals    Frequency    Min 3X/week      PT Plan Current plan remains appropriate    Co-evaluation PT/OT/SLP Co-Evaluation/Treatment: Yes Reason for Co-Treatment: Necessary to address cognition/behavior during functional activity;To address functional/ADL transfers          AM-PAC PT "6 Clicks" Daily Activity  Outcome Measure  Difficulty turning over in bed (including adjusting bedclothes, sheets and blankets)?: None Difficulty moving from lying on back to sitting on the side of the bed? : A Little Difficulty sitting down on and standing up from a chair with arms (e.g., wheelchair, bedside commode, etc,.)?: A Little Help needed moving to and from a bed to chair (including a wheelchair)?: A Little Help needed walking in hospital room?: A Little Help needed climbing 3-5 steps with a railing? : A Lot 6 Click Score: 18    End of Session Equipment Utilized During Treatment: Gait belt Activity Tolerance: Patient tolerated treatment well Patient left: with call bell/phone within reach;in chair;with chair alarm set Nurse Communication: Mobility status PT Visit Diagnosis: Other abnormalities of gait and mobility (R26.89);Muscle weakness (generalized) (M62.81);Pain Pain - Right/Left: Right Pain - part of  body: Leg     Time: 1040-1057 PT Time Calculation (min) (ACUTE ONLY): 17 min  Charges:  $Therapeutic Activity: 8-22 mins                     Ellamae Sia, PT, DPT Acute Rehabilitation Services  Pager: [phone]    Willy Eddy 12/25/2017, 11:10 AM

## 2017-12-25 NOTE — Evaluation (Signed)
Occupational Therapy Evaluation Patient Details Name: [name] MRN: [id number] DOB: May 19, 1941 Today's Date: 12/25/2017    History of Present Illness [name] is a 77 y.o. female with extensive medical history listed below, including COPD, hypertension, CHF, ESRD on hemodialysis Monday Wednesday and Friday, history of major depression, chronic anemia, IBS, restless leg syndrome, chronic peripheral neuropathy, peripheral vascular disease-peripheral arterial disease, status post right superficial femoral artery stenting by Dr. Lucky Cowboy on 12/17/2017, presenting with severe right lower extremity pain, sharp, "comes in bouts ", progressing since last Thursday, extending from the toes to the right groin.  The patient reports having neuropathy, and admits to be very hypersensitive to touch.  At home, she took tramadol, but did not seem to help, last taken at 3 AM today.  The left leg has also some pain, but not as severe as on the right.  She was unable to stand or getting a wheelchair on Friday, and was unable to go to dialysis due to the symptoms. Doppler ultrasound negative for acute DVT.   Clinical Impression   Pt walks with a cane and is assisted for bathing and dressing by an aide 3 x a week. She reports she manages her own bills. Pt lives with her family. She presents with lethargy, suspected due to pain medication, generalized weakness, impaired standing balance and decreased activity tolerance. She reports she is having less leg pain than upon admission. Pt currently requires set up to min assist for ADL.  Will follow acutely. Recommending SNF for rehab unless family can manage pt at home once she is ready.     Follow Up Recommendations  SNF;Supervision/Assistance - 24 hour(depending on progress)    Equipment Recommendations       Recommendations for Other Services       Precautions / Restrictions Precautions Precautions: Fall Precaution Comments: improved R LE  pain Restrictions Weight Bearing Restrictions: No      Mobility Bed Mobility Overal bed mobility: Needs Assistance Bed Mobility: Supine to Sit     Supine to sit: Supervision     General bed mobility comments: supervision for safety with increased time  Transfers Overall transfer level: Needs assistance Equipment used: Rolling walker (2 wheeled) Transfers: Sit to/from Stand Sit to Stand: Supervision         General transfer comment: for safety    Balance Overall balance assessment: Needs assistance Sitting-balance support: No upper extremity supported;Feet supported Sitting balance-Leahy Scale: Good     Standing balance support: Bilateral upper extremity supported;During functional activity Standing balance-Leahy Scale: Poor                             ADL either performed or assessed with clinical judgement   ADL Overall ADL's : Needs assistance/impaired Eating/Feeding: Independent;Sitting   Grooming: Brushing hair;Sitting;Set up   Upper Body Bathing: Minimal assistance;Sitting   Lower Body Bathing: Minimal assistance;Sit to/from stand   Upper Body Dressing : Minimal assistance;Sitting   Lower Body Dressing: Minimal assistance;Sit to/from stand Lower Body Dressing Details (indicate cue type and reason): able to don socks independently Toilet Transfer: Minimal assistance;Ambulation;RW   Toileting- Clothing Manipulation and Hygiene: Minimal assistance;Sit to/from stand       Functional mobility during ADLs: Minimal assistance;Rolling walker       Vision Baseline Vision/History: Wears glasses Wears Glasses: Reading only Patient Visual Report: No change from baseline       Perception     Praxis  Pertinent Vitals/Pain Pain Assessment: Faces Faces Pain Scale: Hurts little more Pain Location: right leg, groin Pain Descriptors / Indicators: Grimacing;Guarding Pain Intervention(s): Monitored during session;Repositioned;Premedicated  before session     Hand Dominance Right   Extremity/Trunk Assessment Upper Extremity Assessment Upper Extremity Assessment: Overall WFL for tasks assessed   Lower Extremity Assessment Lower Extremity Assessment: Defer to PT evaluation       Communication Communication Communication: No difficulties   Cognition Arousal/Alertness: Lethargic;Suspect due to medications(keeping eyes closed at times) Behavior During Therapy: Flat affect Overall Cognitive Status: Impaired/Different from baseline Area of Impairment: Problem solving;Safety/judgement;Attention                 Orientation Level: Disoriented to;Time Current Attention Level: Sustained Memory: Decreased short-term memory Following Commands: Follows one step commands consistently Safety/Judgement: Decreased awareness of safety;Decreased awareness of deficits Awareness: Intellectual Problem Solving: Slow processing General Comments: pt immediately willing to work with therapies, awakened   General Comments       Exercises     Shoulder Instructions      Home Living Family/patient expects to be discharged to:: Private residence Living Arrangements: Other relatives;Children Available Help at Discharge: Family;Available 24 hours/day Type of Home: House Home Access: Stairs to enter [employer] of Steps: 1   Home Layout: One level               Home Equipment: Cane - single point;Walker - 2 wheels          Prior Functioning/Environment Level of Independence: Needs assistance  Gait / Transfers Assistance Needed: uses cane mostly to ambulate, also has a walker ADL's / Homemaking Assistance Needed: aide comes 3 times a week to assist with ADL's            OT Problem List: Decreased strength;Decreased activity tolerance;Impaired balance (sitting and/or standing);Decreased knowledge of use of DME or AE;Pain;Decreased cognition      OT Treatment/Interventions: Self-care/ADL training;DME  and/or AE instruction;Therapeutic activities;Patient/family education;Cognitive remediation/compensation    OT Goals(Current goals can be found in the care plan section) Acute Rehab OT Goals Patient Stated Goal: go home OT Goal Formulation: With patient Time For Goal Achievement: 01/08/18 Potential to Achieve Goals: Good ADL Goals Pt Will Perform Grooming: with supervision;standing Pt Will Perform Upper Body Dressing: with set-up;with supervision;sitting Pt Will Perform Lower Body Dressing: with set-up;with supervision;sit to/from stand Pt Will Transfer to Toilet: with supervision;ambulating;regular height toilet Pt Will Perform Toileting - Clothing Manipulation and hygiene: with supervision;sit to/from stand  OT Frequency: Min 2X/week   Barriers to D/C:            Co-evaluation PT/OT/SLP Co-Evaluation/Treatment: Yes Reason for Co-Treatment: Necessary to address cognition/behavior during functional activity   OT goals addressed during session: ADL's and self-care      AM-PAC PT "6 Clicks" Daily Activity     Outcome Measure Help from another person eating meals?: None Help from another person taking care of personal grooming?: A Little Help from another person toileting, which includes using toliet, bedpan, or urinal?: A Little Help from another person bathing (including washing, rinsing, drying)?: A Little Help from another person to put on and taking off regular upper body clothing?: A Little Help from another person to put on and taking off regular lower body clothing?: A Little 6 Click Score: 19   End of Session Equipment Utilized During Treatment: Gait belt;Rolling walker  Activity Tolerance: Patient tolerated treatment well Patient left: in chair;with call bell/phone within reach;with chair alarm set  OT  Visit Diagnosis: Unsteadiness on feet (R26.81);Other abnormalities of gait and mobility (R26.89);Cognitive communication deficit (R41.841);Pain;Muscle weakness  (generalized) (M62.81)                Time: 1040-1100 OT Time Calculation (min): 20 min Charges:  OT General Charges $OT Visit: 1 Visit OT Evaluation $OT Eval Moderate Complexity: 1 Mod  Malka So 12/25/2017, 12:24 PM  12/25/2017 Nestor Lewandowsky, OTR/L Pager: [phone]

## 2017-12-25 NOTE — Progress Notes (Signed)
Called [facility] regarding patient complaining of double vision and increased pain in her right leg. Patient is able to raise arms, smile, and stick out tongue. She was able to raise her left leg but her right was much weaker due to pain and dvt. Head Ct is ordered and VSS. Will continue to monitor closely. Modena Morrow E, RN 12/25/2017 1815

## 2017-12-25 NOTE — Progress Notes (Signed)
ANTICOAGULATION CONSULT NOTE - Follow-Up  Pharmacy Consult for heparin Indication: DVT  Patient Measurements: Height: 5\' 1"  (154.9 cm) Weight: 152 lb 1.9 oz (69 kg) IBW/kg (Calculated) : 47.8 Heparin Dosing Weight: 63 kg  Vital Signs: Temp: 97.8 F (36.6 C) (07/26 0912) Temp Source: Oral (07/26 0912) BP: 123/44 (07/26 0912) Pulse Rate: 73 (07/26 0912)  Labs: Recent Labs    12/23/17 0444 12/24/17 0420 12/24/17 1131 12/25/17 0639  HGB 10.6* 12.7  --  11.1*  HCT 33.3* 39.8  --  35.1*  PLT 221 278  --  252  LABPROT  --  16.1*  --  18.3*  INR  --  1.31  --  1.54  HEPARINUNFRC  --  0.48 0.39 0.57  CREATININE 9.19* 5.95*  --  8.52*    Estimated Creatinine Clearance: 5 mL/min (A) (by C-G formula based on SCr of 8.52 mg/dL (H)).  Assessment: 47 YOF with hx of ESRD on HD, admitted for R leg pain, LE doppler showed questionable acute DVT in RLE, pharmacy is consulted to start heparin and warfarin.  Heparin level this morning remains therapeutic (HL 0.57 << 0.39, goal of 0.3-0.7). INR SUBtherapeutic though trending up (INR 1.54 << 1.31, goal of 2-3). Hgb 11.1, plts wnl. No bleeding noted.   Goal of Therapy:  INR 2-3 Heparin level 0.3-0.7 units/ml Monitor platelets by anticoagulation protocol: Yes   Plan:  - Continue Heparin drip at 1050 units/hr - Warfarin 5 mg x 1 dose at 1800 today - Will continue to monitor for any signs/symptoms of bleeding and will follow up with heparin level and PT/INR in the a.m.   Thank you for allowing pharmacy to be a part of this patient's care.  Alycia Rossetti, PharmD, BCPS Clinical Pharmacist Pager: [phone] Clinical phone for 12/25/2017 from 7a-3:30p: [phone] If after 3:30p, please call main pharmacy at: [phone] Please check AMION for all [place] numbers 12/25/2017 10:38 AM

## 2017-12-25 NOTE — NC FL2 (Addendum)
[place] LEVEL OF CARE SCREENING TOOL     IDENTIFICATION  Patient Name: [name] Birthdate: July 05, 1940 Sex: female Admission Date (Current Location): 12/21/2017  [place] and [place] Number:  Kathleen Argue [id number] [place] and Address:  [facility] [place], [place] [address], [place], [place] [address]      Provider Number: [id number]  Attending Physician Name and Address:  Georgette Shell, MD  Relative Name and Phone Number:  Benjaman Pott - daughter, [phone]    Current Level of Care: Hospital Recommended Level of Care: [place] Prior Approval Number:    Date Approved/Denied:   PASRR Number: (Submitted for [facility] 7/26)  [id number] E - 7/26-8/25/19  Discharge Plan: SNF    Current Diagnoses: Patient Active Problem List   Diagnosis Date Noted  . Right leg pain 12/21/2017  . PAD (peripheral artery disease) ([place]) 12/21/2017  . Leukocytosis 12/21/2017  . Hyperlipidemia 12/01/2017  . Atherosclerosis of native arteries of extremity with rest pain ([place]) 12/01/2017  . LLQ abdominal pain 08/10/2017  . Left bundle branch block 07/24/2017  . Chest pain 07/24/2017  . Weakness 12/23/2016  . Generalized weakness 12/22/2016  . Elevated troponin 12/06/2016  . HCAP (healthcare-associated pneumonia) 12/06/2016  . Hypokalemia 12/06/2016  . Chronic systolic CHF (congestive heart failure) ([place]) 12/06/2016  . Osteopenia 09/08/2016  . Allergic rhinitis 08/07/2016  . BMI 32.0-32.9,adult 08/07/2016  . Onychomycosis 05/09/2016  . Nonspecific chest pain 04/19/2016  . ACS (acute coronary syndrome) ([place]) 04/19/2016  . Leg pain, bilateral 12/05/2015  . Trouble in sleeping 11/22/2015  . End stage renal disease ([place])   . Hyperkalemia 11/12/2015  . Acute on chronic respiratory failure with hypoxia ([place]) 11/12/2015  . ESRD on dialysis ([place])   . Chronic obstructive pulmonary disease ([place])   . Hypertensive urgency 09/20/2015  . Fecal  incontinence   . Adverse effects of medication 06/21/2015  . Herpes 06/19/2015  . Skin lesion 06/16/2015  . Recurrent genital herpes 06/16/2015  . COPD exacerbation ([place]) 06/05/2015  . Onychocryptosis 04/08/2015  . Unstable gait 03/28/2015  . Renal mass   . Hypertension   . Inadequate pain control 12/30/2014  . CHF (congestive heart failure) ([place]) 12/30/2014  . Meralgia paresthetica of right side 12/26/2014  . Hereditary and idiopathic peripheral neuropathy 02/15/2013  . Dermatitis of face 08/19/2012  . HIP PAIN, BILATERAL 12/07/2008  . BLADDER PROLAPSE 11/17/2007  . OSTEOARTHRITIS, GENERALIZED, MULTIPLE JOINTS 08/18/2007  . Secondary renal hyperparathyroidism ([place]) 06/07/2007  . Anxiety and depression 04/23/2007  . ANEMIA NEC 03/31/2007  . TOBACCO ABUSE 12/10/2006  . HIATAL HERNIA 12/10/2006  . Dysphagia 12/10/2006  . HYPERCHOLESTEROLEMIA 07/30/2006  . Gout, unspecified 07/30/2006  . Major depression in partial remission ([place]) 07/30/2006  . Hypertensive renal disease, malignant, with renal failure 07/30/2006  . GASTROESOPHAGEAL REFLUX, NO ESOPHAGITIS 07/30/2006    Orientation RESPIRATION BLADDER Height & Weight     Self, Time, Situation, Place  Normal Continent Weight: 155 lb 3.3 oz (70.4 kg) Height:  5\' 1"  (154.9 cm)  BEHAVIORAL SYMPTOMS/MOOD NEUROLOGICAL BOWEL NUTRITION STATUS    Convulsions/Seizures(History seizures) Continent Diet(Renal with 1200 mL fluid restriction)  AMBULATORY STATUS COMMUNICATION OF NEEDS Skin   Limited Assist(Min guard, min assist per PT) Verbally Normal                       Personal Care Assistance Level of Assistance  Bathing, Feeding, Dressing Bathing Assistance: Limited assistance Feeding assistance: Independent Dressing Assistance: Limited assistance  Functional Limitations Info  Sight, Hearing, Speech Sight Info: Impaired Hearing Info: Adequate Speech Info: Adequate    SPECIAL CARE FACTORS FREQUENCY  PT (By licensed  PT), OT (By licensed OT)     PT Frequency: Evaluated 7/24 and a minimum of 3X per week therapy recommended during acute inpatient stay OT Frequency: Evaluated 7/26 and a minimum of 2X per week therapy recommended during acute inpatient stay            Contractures Contractures Info: Not present    Additional Factors Info  Code Status, Allergies Code Status Info: Full Allergies Info: Tuberculin tests, Valacyclovir, Codeine, Penicillins, Sulfamethoxazole           Current Medications (12/25/2017):  This is the current hospital active medication list Current Facility-Administered Medications  Medication Dose Route Frequency Provider Last Rate Last Dose  . 0.9 %  sodium chloride infusion  100 mL Intravenous PRN Penninger, Ria Comment, PA      . 0.9 %  sodium chloride infusion  100 mL Intravenous PRN Penninger, Ria Comment, PA      . acetaminophen (TYLENOL) tablet 650 mg  650 mg Oral Q6H PRN Rondel Jumbo, PA-C   650 mg at 12/24/17 2145   Or  . acetaminophen (TYLENOL) suppository 650 mg  650 mg Rectal Q6H PRN Rondel Jumbo, PA-C      . allopurinol (ZYLOPRIM) tablet 100 mg  100 mg Oral Once per day on Sun Tue Thu Sat Rondel Jumbo, PA-C   100 mg at 12/24/17 1320  . alteplase (CATHFLO ACTIVASE) injection 2 mg  2 mg Intracatheter Once PRN Penninger, Ria Comment, PA      . amLODipine (NORVASC) tablet 10 mg  10 mg Oral QHS Rondel Jumbo, PA-C   10 mg at 12/24/17 2145  . atorvastatin (LIPITOR) tablet 40 mg  40 mg Oral QHS Rondel Jumbo, PA-C   40 mg at 12/24/17 2145  . azelastine (ASTELIN) 0.1 % nasal spray 1 spray  1 spray Each Nare BID PRN Rondel Jumbo, PA-C      . bisacodyl (DULCOLAX) EC tablet 10 mg  10 mg Oral Once Georgette Shell, MD      . bisacodyl (DULCOLAX) suppository 10 mg  10 mg Rectal Daily PRN Rondel Jumbo, PA-C      . cinacalcet (SENSIPAR) tablet 90 mg  90 mg Oral QHS Rondel Jumbo, PA-C   90 mg at 12/24/17 2145  . clopidogrel (PLAVIX) tablet 75 mg  75 mg Oral  Daily Rondel Jumbo, PA-C   75 mg at 12/24/17 1114  . coumadin book   Does not apply Once Rolla Flatten, [facility]      . dicyclomine (BENTYL) capsule 10 mg  10 mg Oral TID AC & HS Rai, Ripudeep K, MD   10 mg at 12/25/17 0830  . doxercalciferol (HECTOROL) injection 1 mcg  1 mcg Intravenous Q M,W,F-HD Penninger, Lindsay, PA   1 mcg at 12/23/17 1102  . DULoxetine (CYMBALTA) DR capsule 60 mg  60 mg Oral QHS Rondel Jumbo, PA-C   60 mg at 12/24/17 2145  . feeding supplement (NEPRO CARB STEADY) liquid 237 mL  237 mL Oral BID BM Reyne Dumas, MD   237 mL at 12/23/17 1409  . fentaNYL (SUBLIMAZE) injection 25 mcg  25 mcg Intravenous Q3H PRN Rai, Ripudeep K, MD      . glycopyrrolate (ROBINUL) tablet 2 mg  2 mg Oral BID Rondel Jumbo, PA-C   2  mg at 12/24/17 2152  . heparin ADULT infusion 100 units/mL (25000 units/231mL sodium chloride 0.45%)  1,050 Units/hr Intravenous Continuous Rai, Ripudeep K, MD 10.5 mL/hr at 12/25/17 0600 1,050 Units/hr at 12/25/17 0600  . heparin injection 1,000 Units  1,000 Units Dialysis PRN Penninger, Ria Comment, [place]      . hydrOXYzine (ATARAX/VISTARIL) tablet 25 mg  25 mg Oral Q8H PRN Sharene Butters E, PA-C      . lidocaine (PF) (XYLOCAINE) 1 % injection 5 mL  5 mL Intradermal PRN Penninger, Ria Comment, PA      . lidocaine-prilocaine (EMLA) cream 1 application  1 application Topical Once per day on Mon Wed Fri Rondel Jumbo, PA-C   1 application at 65/68/12 1118  . lidocaine-prilocaine (EMLA) cream 1 application  1 application Topical PRN Penninger, Ria Comment, [place]      . LORazepam (ATIVAN) tablet 1 mg  1 mg Oral Q8H PRN Rai, Ripudeep K, MD   1 mg at 12/24/17 2146  . metoprolol succinate (TOPROL-XL) 24 hr tablet 100 mg  100 mg Oral BID Rondel Jumbo, PA-C   100 mg at 12/24/17 2146  . multivitamin (RENA-VIT) tablet 1 tablet  1 tablet Oral QHS Rondel Jumbo, PA-C   1 tablet at 12/24/17 2145  . ondansetron (ZOFRAN) tablet 4 mg  4 mg Oral Q6H PRN Rondel Jumbo, PA-C       Or  .  ondansetron (ZOFRAN) injection 4 mg  4 mg Intravenous Q6H PRN Rondel Jumbo, PA-C      . pantoprazole (PROTONIX) EC tablet 20 mg  20 mg Oral BID AC Wertman, Sara E, PA-C   20 mg at 12/25/17 0830  . pentafluoroprop-tetrafluoroeth (GEBAUERS) aerosol 1 application  1 application Topical PRN Penninger, Ria Comment, [place]      . pregabalin (LYRICA) capsule 100 mg  100 mg Oral QHS Rai, Ripudeep K, MD   100 mg at 12/24/17 2146  . senna-docusate (Senokot-S) tablet 1 tablet  1 tablet Oral BID Rai, Ripudeep K, MD   1 tablet at 12/24/17 1116  . sevelamer carbonate (RENVELA) tablet 2,400 mg  2,400 mg Oral TID WC Wertman, Sara E, PA-C   2,400 mg at 12/25/17 0830  . warfarin (COUMADIN) tablet 5 mg  5 mg Oral ONCE-1800 Rolla Flatten, [facility]      . Warfarin - Pharmacist Dosing Inpatient   Does not apply [place], [facility]         Discharge Medications: Please see discharge summary for a list of discharge medications.  Relevant Imaging Results:  Relevant Lab Results:   Additional Information [phone]. Dialysis patient MWF at [facility].  Sable Feil, LCSW

## 2017-12-25 NOTE — Progress Notes (Addendum)
[facility] Progress Note   Subjective:   Leg pain improving. No new complaints today.   Objective Vitals:   12/24/17 2141 12/25/17 0641 12/25/17 0642 12/25/17 0912  BP: (!) 129/45 (!) 97/35 (!) 114/31 (!) 123/44  Pulse: 79 73 72 73  Resp: 16 18 18 18   Temp: 97.6 F (36.4 C) 98.2 F (36.8 C) 98.2 F (36.8 C) 97.8 F (36.6 C)  TempSrc: Oral Oral Oral Oral  SpO2: 100% 98% 98% 94%  Weight:      Height:       Physical Exam General: elderly female resting in bed NAD Heart: RRR Lungs:nml WOB Extremities:no LE edema appreciated Dialysis Access: LU HeRO   Filed Weights   12/23/17 0800 12/23/17 1151 12/23/17 2104  Weight: 72.3 kg (159 lb 6.3 oz) 69 kg (152 lb 1.9 oz) 69 kg (152 lb 1.9 oz)    Intake/Output Summary (Last 24 hours) at 12/25/2017 1032 Last data filed at 12/25/2017 0900 Gross per 24 hour  Intake 696.57 ml  Output 0 ml  Net 696.57 ml    Additional Objective Labs: Basic Metabolic Panel: Recent Labs  Lab 12/21/17 1309  12/23/17 0444 12/24/17 0420 12/25/17 0639  NA 136   < > 137 136 135  K 5.8*   < > 5.4* 4.4 4.1  CL 95*   < > 95* 94* 92*  CO2 24   < > 27 28 25   GLUCOSE 37*   < > 86 81 72  BUN 61*   < > 30* 14 31*  CREATININE 13.27*   < > 9.19* 5.95* 8.52*  CALCIUM 8.2*   < > 8.3* 9.0 8.7*  PHOS 7.7*  --   --   --   --    < > = values in this interval not displayed.   Liver Function Tests: Recent Labs  Lab 12/21/17 0840 12/21/17 1309  AST 19  --   ALT 10  --   ALKPHOS 82  --   BILITOT 0.6  --   PROT 7.2  --   ALBUMIN 2.6* 2.5*   CBC: Recent Labs  Lab 12/21/17 0840  12/21/17 1309 12/22/17 0544 12/23/17 0444 12/24/17 0420 12/25/17 0639  WBC 12.4*  --  14.1* 11.4* 8.3 11.5* 11.9*  NEUTROABS 9.3*  --   --   --   --   --   --   HGB 10.8*   < > 10.5* 10.8* 10.6* 12.7 11.1*  HCT 33.7*   < > 32.6* 34.5* 33.3* 39.8 35.1*  MCV 90.3  --  89.3 89.8 90.0 89.4 90.2  PLT 215  --  257 225 221 278 252   < > = values in this interval  not displayed.    Cardiac Enzymes: Recent Labs  Lab 12/21/17 1309  CKTOTAL 46   CBG: Recent Labs  Lab 12/21/17 1045 12/21/17 1107 12/21/17 1129 12/21/17 1352 12/21/17 1601  GLUCAP 119* 117* 80 70 107*   Lab Results  Component Value Date   INR 1.54 12/25/2017   INR 1.31 12/24/2017   INR 1.14 11/14/2013   Studies/Results: Dg Abd 2 Views  Result Date: 12/24/2017 CLINICAL DATA:  Abdominal pain. EXAM: ABDOMEN - 2 VIEW COMPARISON:  CT abdomen and pelvis 08/20/2017 FINDINGS: No intraperitoneal free air is identified. There is stool throughout the colon, with a large amount of stool noted in the ascending colon and rectum. No dilated loops of bowel are seen to suggest obstruction. Atherosclerotic vascular calcifications and pelvic surgical clips  are noted. A HERO graft is noted in the chest. Bibasilar lung opacities are similar to a recent chest radiograph from 12/21/2017 and likely represent atelectasis. IMPRESSION: Large amount of colonic and rectal stool. No evidence of bowel obstruction. Electronically Signed   By: Logan Bores M.D.   On: 12/24/2017 10:01    Medications: . heparin 1,050 Units/hr (12/25/17 0600)   . allopurinol  100 mg Oral Once per day on Sun Tue Thu Sat  . amLODipine  10 mg Oral QHS  . atorvastatin  40 mg Oral QHS  . cinacalcet  90 mg Oral QHS  . cloNIDine  0.2 mg Oral Daily  . clopidogrel  75 mg Oral Daily  . dicyclomine  10 mg Oral TID AC & HS  . doxercalciferol  1 mcg Intravenous Q M,W,F-HD  . DULoxetine  60 mg Oral QHS  . feeding supplement (NEPRO CARB STEADY)  237 mL Oral BID BM  . glycopyrrolate  2 mg Oral BID  . lidocaine-prilocaine  1 application Topical Once per day on Mon Wed Fri  . metoprolol succinate  100 mg Oral BID  . multivitamin  1 tablet Oral QHS  . pantoprazole  20 mg Oral BID AC  . pregabalin  100 mg Oral QHS  . senna-docusate  1 tablet Oral BID  . sevelamer carbonate  2,400 mg Oral TID WC  . Warfarin - Pharmacist Dosing Inpatient    Does not apply q1800    Dialysis Orders: MWF [facility] 3h 30min  71kg  2/2.25  LU HeRO  Hep 4000 Mircera55mcg q2wks - last 12/07/17 Hectorol8mcg IV qHD    Assessment/Plan: 1. RLE pain - anticoagulation started for presumed DVT.   Hx PAD w/RLE resting pain s/p recent intervention by Dr. Lucky Cowboy on 7/18.  2. ESRD - HD MWF.  For HD today on schedule.  3. Hypertension/volume - BP's ok, no gross vol excess on exam. Lower dry wt possibly.  4. Anemia of CKD - Hgb >11.  No indication for ESA at this time. 5. Secondary Hyperparathyroidism - Ca 9.0, Phos 7.7.Continue binders and VDRA. 6. Nutrition - prostat for low albumin  7. Abd pain/constipation - CLs/stool softeners per primary  8. Hx seizure d/o - per primary 9. Hx depression/PTSD - per primary  Lynnda Child PA-C [facility] Pager [phone] 12/25/2017,10:32 AM  Pt seen, examined and agree w A/P as above.  Kelly Splinter MD [employer] pager [phone]   12/25/2017, 12:19 PM

## 2017-12-25 NOTE — Clinical Social Work Note (Addendum)
CSW in communication with patient's daughter regarding discharge plan. Was advised by nurse case manager Levada Dy that per [facility] representative, patient is not eligible for Home First program, and calls were made to daughter by [facility] rep and VM's left. CSW contacted daughter and she will contact [place] person and requested that CSW not move forward with facility search until she contacts this worker. CSW will continue to follow, provide SW intervention services and assist with discharge based on daughter's discharge decision.  Received PASRR number for patient - [id number] E, effective 7/26 - 01/24/18.  5:14 pm - Informed by nurse case manager Whitman Hero that she talked with patient's daughter and Ms. Knoop will discharge home with [facility] services, possibly Saturday. CSW signing off, however please reconsult if any SW intervention services needed prior to discharge.  Shizue Kaseman Givens, MSW, LCSW Licensed Clinical Social Worker [place] [phone]

## 2017-12-25 NOTE — Progress Notes (Addendum)
NCM received call from pt's [name]. Darlene stated after speaking with siblings, they have agreed  Mom will d/c to home with home health services (RN,PT,NA). Choice offered.  [facility] selected to provide home health services. NCM made [facility]'s liaison aware. Whitman Hero RN,BSN,CM

## 2017-12-26 LAB — HEPARIN LEVEL (UNFRACTIONATED): Heparin Unfractionated: 0.35 IU/mL (ref 0.30–0.70)

## 2017-12-26 LAB — CBC
HCT: 34.2 % — ABNORMAL LOW (ref 36.0–46.0)
HEMOGLOBIN: 10.8 g/dL — AB (ref 12.0–15.0)
MCH: 28.3 pg (ref 26.0–34.0)
MCHC: 31.6 g/dL (ref 30.0–36.0)
MCV: 89.8 fL (ref 78.0–100.0)
Platelets: 228 10*3/uL (ref 150–400)
RBC: 3.81 MIL/uL — AB (ref 3.87–5.11)
RDW: 16.2 % — ABNORMAL HIGH (ref 11.5–15.5)
WBC: 10.5 10*3/uL (ref 4.0–10.5)

## 2017-12-26 LAB — PROTIME-INR
INR: 1.88
PROTHROMBIN TIME: 21.4 s — AB (ref 11.4–15.2)

## 2017-12-26 MED ORDER — WARFARIN SODIUM 5 MG PO TABS
5.0000 mg | ORAL_TABLET | Freq: Once | ORAL | Status: AC
  Administered 2017-12-26: 5 mg via ORAL
  Filled 2017-12-26: qty 1

## 2017-12-26 MED ORDER — TRAMADOL HCL 50 MG PO TABS
50.0000 mg | ORAL_TABLET | Freq: Four times a day (QID) | ORAL | Status: DC | PRN
Start: 2017-12-26 — End: 2017-12-26

## 2017-12-26 MED ORDER — TRAMADOL HCL 50 MG PO TABS
50.0000 mg | ORAL_TABLET | Freq: Four times a day (QID) | ORAL | Status: DC | PRN
  Administered 2017-12-26 – 2017-12-27 (×2): 50 mg via ORAL
  Filled 2017-12-26 (×2): qty 1

## 2017-12-26 NOTE — Progress Notes (Signed)
PROGRESS NOTE    [name]  QJF:[id number] DOB: 03/09/1941 DOA: 12/21/2017 PCP: [place], Betty G, MD   Brief Narrative: 77 year old female with COPD, hypertension, ESRD on hemodialysis MWF, depression, chronic anemia, IBS, restless leg syndrome, PAD, neuropathy status post right superficial femoral artery stenting by Dr. Lucky Cowboy on 12/17/2017 presented with severe right lower extremity pain, sharp, intermittent, progressing since last Thursday 5 days ago. States tramadol did not help, unable to stand or get a wheelchair hence was unable to go to dialysis due to pain.     Assessment & Plan:   Principal Problem:   Right leg pain Active Problems:   HYPERCHOLESTEROLEMIA   Gout, unspecified   ANEMIA NEC   Anxiety and depression   TOBACCO ABUSE   GASTROESOPHAGEAL REFLUX, NO ESOPHAGITIS   CHF (congestive heart failure) (HCC)   Hypertension   Hyperkalemia   ESRD on dialysis [facility])   Chronic obstructive pulmonary disease (HCC)   PAD (peripheral artery disease) (HCC)   Right leg pain in the setting of PAD, chronic peripheral neuropathy, recent right superficial femoral artery stenting -No evidence of hematoma, Doppler ultrasound final readwasnegative for DVT, read by Dr. Marin Olp was seen by Dr. Donnetta Hutching in the ED on 7/22 and did not feel patient had active vascular disease.  -Patient continued to complained ofRLEpain, hence vascular surgery was reconsulted, seen by Dr. Donnetta Hutching on 7/24, recommended that patient does have a DVT in RLEanticoagulation for 3 months. -Started on IV heparin drip and Coumadin(per nephrology, noNOAC or lovenox),pharmacy following.  Active Problems: Acute abdominal pain likely due to severe constipation-patient had large bowel movement yesterday currently she is free of abdominal pain and feeling and asking for regular food. -ESRD on hemodialysis, MWF, hyperkalemia -Last HD on 7/17, states missed hemodialysis due to pain -Nephrology consulted, patient  started on hemodialysis per schedule  Mild acute metabolic encephalopathy -Per son at the bedside, patient was confused at home, at times hallucinating, symptoms started after she was taking narcotics for the pain -Currently alert and oriented, uncomfortable with pain  HYPERCHOLESTEROLEMIA -Continue statins   Gout, unspecified Continue allopurinol  Anemia of chronic disease/CKD -H&H currently stable   Anxiety and depression -Continue Cymbalta  GASTROESOPHAGEAL REFLUX, NO ESOPHAGITIS -Continue PPI  Diastolic chronic CHF (congestive heart failure) ([place]) -Prior echo in 07/24/2017 showed EF of 55 to 89%, grade 2 diastolic dysfunction -Currently stable, volume management with hemodialysis  Essential hypertension -BP stable, continue clonidine, amlodipine, beta-blocker.   Chronic obstructive pulmonary disease (HCC) -Stable, no wheezing  PAD (peripheral artery disease) (HCC) -Continue Plavix      DVT prophylaxis: Heparin Code Status: Full code Family Communication no family at [place] to discharge her tomorrow once her INR is above 2.  On Coumadin.   Consultants:   Vascular surgery  Procedures: None Antimicrobials none Subjective: Feels well she had complained of double vision last night stat CT head was done which did not show any acute changes.  Currently she sees  good she denies double vision or headache no nausea vomiting.  Patient ambulated in the room at this time.   Objective: Vitals:   12/25/17 2047 12/26/17 0314 12/26/17 0545 12/26/17 1011  BP: (!) 109/44  (!) 131/46 (!) 127/46  Pulse: 86  87 83  Resp: 20  20 18   Temp: 98.3 F (36.8 C)  98.2 F (36.8 C) 98.6 F (37 C)  TempSrc: Oral  Oral Oral  SpO2: 98%  93% 96%  Weight:  71.2 kg (156 lb 15.5  oz)    Height:        Intake/Output Summary (Last 24 hours) at 12/26/2017 1206 Last data filed at 12/26/2017 0900 Gross per 24 hour  Intake 240 ml  Output 0 ml    Net 240 ml   Filed Weights   12/25/17 1315 12/25/17 1710 12/26/17 0314  Weight: 70.4 kg (155 lb 3.3 oz) 70.4 kg (155 lb 3.3 oz) 71.2 kg (156 lb 15.5 oz)    Examination:  General exam: Appears calm and comfortable  Respiratory system: Clear to auscultation. Respiratory effort normal. Cardiovascular system: S1 & S2 heard, RRR. No JVD, murmurs, rubs, gallops or clicks. No pedal edema. Gastrointestinal system: Abdomen is nondistended, soft and nontender. No organomegaly or masses felt. Normal bowel sounds heard. Central nervous system: Alert and oriented. No focal neurological deficits. Extremities: TRACE edema bilaterally Skin: No rashes, lesions or ulcers Psychiatry: Judgement and insight appear normal. Mood & affect appropriate.     Data Reviewed: I have personally reviewed following labs and imaging studies  CBC: Recent Labs  Lab 12/21/17 0840  12/22/17 0544 12/23/17 0444 12/24/17 0420 12/25/17 0639 12/26/17 0601  WBC 12.4*   < > 11.4* 8.3 11.5* 11.9* 10.5  NEUTROABS 9.3*  --   --   --   --   --   --   HGB 10.8*   < > 10.8* 10.6* 12.7 11.1* 10.8*  HCT 33.7*   < > 34.5* 33.3* 39.8 35.1* 34.2*  MCV 90.3   < > 89.8 90.0 89.4 90.2 89.8  PLT 215   < > 225 221 278 252 228   < > = values in this interval not displayed.   Basic Metabolic Panel: Recent Labs  Lab 12/21/17 1309 12/22/17 0544 12/23/17 0444 12/24/17 0420 12/25/17 0639 12/25/17 1300  NA 136 138 137 136 135 134*  K 5.8* 4.6 5.4* 4.4 4.1 4.6  CL 95* 97* 95* 94* 92* 92*  CO2 24 28 27 28 25 27   GLUCOSE 37* 74 86 81 72 84  BUN 61* 20 30* 14 31* 28*  CREATININE 13.27* 7.02* 9.19* 5.95* 8.52* 8.98*  CALCIUM 8.2* 8.1* 8.3* 9.0 8.7* 8.7*  PHOS 7.7*  --   --   --   --  7.5*   GFR: Estimated Creatinine Clearance: 4.8 mL/min (A) (by C-G formula based on SCr of 8.98 mg/dL (H)). Liver Function Tests: Recent Labs  Lab 12/21/17 0840 12/21/17 1309 12/25/17 1300  AST 19  --   --   ALT 10  --   --   ALKPHOS 82   --   --   BILITOT 0.6  --   --   PROT 7.2  --   --   ALBUMIN 2.6* 2.5* 2.4*   No results for input(s): LIPASE, AMYLASE in the last 168 hours. No results for input(s): AMMONIA in the last 168 hours. Coagulation Profile: Recent Labs  Lab 12/24/17 0420 12/25/17 0639 12/26/17 0601  INR 1.31 1.54 1.88   Cardiac Enzymes: Recent Labs  Lab 12/21/17 1309  CKTOTAL 46   BNP (last 3 results) No results for input(s): PROBNP in the last 8760 hours. HbA1C: No results for input(s): HGBA1C in the last 72 hours. CBG: Recent Labs  Lab 12/21/17 1045 12/21/17 1107 12/21/17 1129 12/21/17 1352 12/21/17 1601  GLUCAP 119* 117* 80 70 107*   Lipid Profile: No results for input(s): CHOL, HDL, LDLCALC, TRIG, CHOLHDL, LDLDIRECT in the last 72 hours. Thyroid Function Tests: No results for input(s): TSH, T4TOTAL,  FREET4, T3FREE, THYROIDAB in the last 72 hours. Anemia Panel: No results for input(s): VITAMINB12, FOLATE, FERRITIN, TIBC, IRON, RETICCTPCT in the last 72 hours. Sepsis Labs: Recent Labs  Lab 12/21/17 1309  PROCALCITON 0.80    Recent Results (from the past 240 hour(s))  MRSA PCR Screening     Status: None   Collection Time: 12/21/17  6:12 PM  Result Value Ref Range Status   MRSA by PCR NEGATIVE NEGATIVE Final    Comment:        The GeneXpert MRSA Assay (FDA approved for NASAL specimens only), is one component of a comprehensive MRSA colonization surveillance program. It is not intended to diagnose MRSA infection nor to guide or monitor treatment for MRSA infections. Performed at [facility], [place] [address], [place], [place] [address]          Radiology Studies: Ct Head Wo Contrast  Result Date: 12/25/2017 CLINICAL DATA:  [place]iplopia EXAM: CT HEAD WITHOUT CONTRAST TECHNIQUE: Contiguous axial images were obtained from the base of the skull through the vertex without intravenous contrast. COMPARISON:  MRI 05/30/2017, CT brain 11/14/2013 FINDINGS: Brain: No acute  territorial infarction, hemorrhage or intracranial mass. Encephalomalacia in the left parasagittal frontal lobe. Mild atrophy. Stable ventricle size. Vascular: No hyperdense vessels.  Carotid vascular calcification Skull: Prior bifrontal craniotomy. Sinuses/Orbits: Postsurgical changes of the left maxillary and ethmoid sinuses. Mucosal thickening within the ethmoid and maxillary sinuses. No acute orbital abnormality is seen Other: None IMPRESSION: 1. No CT evidence for acute intracranial abnormality. 2. Encephalomalacia in the left frontal lobe. Previous anterior craniotomy. Electronically Signed   By: Donavan Foil M.D.   On: 12/25/2017 19:38        Scheduled Meds: . allopurinol  100 mg Oral Once per day on Sun Tue Thu Sat  . amLODipine  10 mg Oral QHS  . atorvastatin  40 mg Oral QHS  . cinacalcet  90 mg Oral QHS  . clopidogrel  75 mg Oral Daily  . dicyclomine  10 mg Oral TID AC & HS  . doxercalciferol  1 mcg Intravenous Q M,W,F-HD  . DULoxetine  60 mg Oral QHS  . feeding supplement (NEPRO CARB STEADY)  237 mL Oral BID BM  . glycopyrrolate  2 mg Oral BID  . lidocaine-prilocaine  1 application Topical Once per day on Mon Wed Fri  . metoprolol succinate  100 mg Oral BID  . multivitamin  1 tablet Oral QHS  . pantoprazole  20 mg Oral BID AC  . pregabalin  100 mg Oral QHS  . senna-docusate  1 tablet Oral BID  . sevelamer carbonate  2,400 mg Oral TID WC  . warfarin  5 mg Oral ONCE-1800  . Warfarin - Pharmacist Dosing Inpatient   Does not apply q1800   Continuous Infusions: . heparin 1,050 Units/hr (12/25/17 0600)     LOS: 3 days   Georgette Shell, MD [facility]  If 7PM-7AM, please contact night-coverage [email or web address] Password [facility] 12/26/2017, 12:06 PM

## 2017-12-26 NOTE — Progress Notes (Signed)
ANTICOAGULATION CONSULT NOTE - Follow-Up  Pharmacy Consult for heparin Indication: DVT  Patient Measurements: Height: 5\' 1"  (154.9 cm) Weight: 156 lb 15.5 oz (71.2 kg) IBW/kg (Calculated) : 47.8 Heparin Dosing Weight: 63 kg  Vital Signs: Temp: 98.6 F (37 C) (07/27 1011) Temp Source: Oral (07/27 1011) BP: 127/46 (07/27 1011) Pulse Rate: 83 (07/27 1011)  Labs: Recent Labs    12/24/17 0420 12/24/17 1131 12/25/17 0639 12/25/17 1300 12/26/17 0601  HGB 12.7  --  11.1*  --  10.8*  HCT 39.8  --  35.1*  --  34.2*  PLT 278  --  252  --  228  LABPROT 16.1*  --  18.3*  --  21.4*  INR 1.31  --  1.54  --  1.88  HEPARINUNFRC 0.48 0.39 0.57  --  0.35  CREATININE 5.95*  --  8.52* 8.98*  --     Estimated Creatinine Clearance: 4.8 mL/min (A) (by C-G formula based on SCr of 8.98 mg/dL (H)).  Assessment: 32 YOF with hx of ESRD on HD, admitted for R leg pain, LE doppler showed questionable acute DVT in RLE, pharmacy is consulted to start heparin and warfarin.  Heparin level this morning remains therapeutic (HL 0.35 << 0.57, goal of 0.3-0.7). INR SUBtherapeutic though trending up (INR 1.88 << 1.54, goal of 2-3). Hgb 10.8, plts wnl. No bleeding noted.   Goal of Therapy:  INR 2-3 Heparin level 0.3-0.7 units/ml Monitor platelets by anticoagulation protocol: Yes   Plan:  - Continue Heparin drip at 1050 units/hr - Warfarin 5 mg x 1 dose at 1800 today - Will continue to monitor for any signs/symptoms of bleeding and will follow up with heparin level and PT/INR in the a.m.   Thank you for allowing pharmacy to be a part of this patient's care.  Alycia Rossetti, PharmD, BCPS Clinical Pharmacist Pager: [phone] Clinical phone for 12/26/2017 from 7a-3:30p: [phone] If after 3:30p, please call main pharmacy at: [phone] Please check AMION for all [place] numbers 12/26/2017 10:23 AM

## 2017-12-26 NOTE — Progress Notes (Addendum)
[facility] Progress Note   Subjective:   Leg pain improving. Up and walking this morning.   Objective Vitals:   12/25/17 2047 12/26/17 0314 12/26/17 0545 12/26/17 1011  BP: (!) 109/44  (!) 131/46 (!) 127/46  Pulse: 86  87 83  Resp: 20  20 18   Temp: 98.3 F (36.8 C)  98.2 F (36.8 C) 98.6 F (37 C)  TempSrc: Oral  Oral Oral  SpO2: 98%  93% 96%  Weight:  71.2 kg (156 lb 15.5 oz)    Height:       Physical Exam General: elderly female resting in bed NAD Heart: RRR Lungs:nml WOB Extremities:no LE edema appreciated Dialysis Access: LU HeRO   Filed Weights   12/25/17 1315 12/25/17 1710 12/26/17 0314  Weight: 70.4 kg (155 lb 3.3 oz) 70.4 kg (155 lb 3.3 oz) 71.2 kg (156 lb 15.5 oz)    Intake/Output Summary (Last 24 hours) at 12/26/2017 1022 Last data filed at 12/25/2017 1710 Gross per 24 hour  Intake -  Output 0 ml  Net 0 ml    Additional Objective Labs: Basic Metabolic Panel: Recent Labs  Lab 12/21/17 1309  12/24/17 0420 12/25/17 0639 12/25/17 1300  NA 136   < > 136 135 134*  K 5.8*   < > 4.4 4.1 4.6  CL 95*   < > 94* 92* 92*  CO2 24   < > 28 25 27   GLUCOSE 37*   < > 81 72 84  BUN 61*   < > 14 31* 28*  CREATININE 13.27*   < > 5.95* 8.52* 8.98*  CALCIUM 8.2*   < > 9.0 8.7* 8.7*  PHOS 7.7*  --   --   --  7.5*   < > = values in this interval not displayed.   Liver Function Tests: Recent Labs  Lab 12/21/17 0840 12/21/17 1309 12/25/17 1300  AST 19  --   --   ALT 10  --   --   ALKPHOS 82  --   --   BILITOT 0.6  --   --   PROT 7.2  --   --   ALBUMIN 2.6* 2.5* 2.4*   CBC: Recent Labs  Lab 12/21/17 0840  12/22/17 0544 12/23/17 0444 12/24/17 0420 12/25/17 0639 12/26/17 0601  WBC 12.4*   < > 11.4* 8.3 11.5* 11.9* 10.5  NEUTROABS 9.3*  --   --   --   --   --   --   HGB 10.8*   < > 10.8* 10.6* 12.7 11.1* 10.8*  HCT 33.7*   < > 34.5* 33.3* 39.8 35.1* 34.2*  MCV 90.3   < > 89.8 90.0 89.4 90.2 89.8  PLT 215   < > 225 221 278 252 228   <  > = values in this interval not displayed.    Cardiac Enzymes: Recent Labs  Lab 12/21/17 1309  CKTOTAL 46   CBG: Recent Labs  Lab 12/21/17 1045 12/21/17 1107 12/21/17 1129 12/21/17 1352 12/21/17 1601  GLUCAP 119* 117* 80 70 107*   Lab Results  Component Value Date   INR 1.88 12/26/2017   INR 1.54 12/25/2017   INR 1.31 12/24/2017   Studies/Results: Ct Head Wo Contrast  Result Date: 12/25/2017 CLINICAL DATA:  Diplopia EXAM: CT HEAD WITHOUT CONTRAST TECHNIQUE: Contiguous axial images were obtained from the base of the skull through the vertex without intravenous contrast. COMPARISON:  MRI 05/30/2017, CT brain 11/14/2013 FINDINGS: Brain: No acute territorial infarction,  hemorrhage or intracranial mass. Encephalomalacia in the left parasagittal frontal lobe. Mild atrophy. Stable ventricle size. Vascular: No hyperdense vessels.  Carotid vascular calcification Skull: Prior bifrontal craniotomy. Sinuses/Orbits: Postsurgical changes of the left maxillary and ethmoid sinuses. Mucosal thickening within the ethmoid and maxillary sinuses. No acute orbital abnormality is seen Other: None IMPRESSION: 1. No CT evidence for acute intracranial abnormality. 2. Encephalomalacia in the left frontal lobe. Previous anterior craniotomy. Electronically Signed   By: Donavan Foil M.D.   On: 12/25/2017 19:38    Medications: . heparin 1,050 Units/hr (12/25/17 0600)   . allopurinol  100 mg Oral Once per day on Sun Tue Thu Sat  . amLODipine  10 mg Oral QHS  . atorvastatin  40 mg Oral QHS  . cinacalcet  90 mg Oral QHS  . clopidogrel  75 mg Oral Daily  . dicyclomine  10 mg Oral TID AC & HS  . doxercalciferol  1 mcg Intravenous Q M,W,F-HD  . DULoxetine  60 mg Oral QHS  . feeding supplement (NEPRO CARB STEADY)  237 mL Oral BID BM  . glycopyrrolate  2 mg Oral BID  . lidocaine-prilocaine  1 application Topical Once per day on Mon Wed Fri  . metoprolol succinate  100 mg Oral BID  . multivitamin  1 tablet  Oral QHS  . pantoprazole  20 mg Oral BID AC  . pregabalin  100 mg Oral QHS  . senna-docusate  1 tablet Oral BID  . sevelamer carbonate  2,400 mg Oral TID WC  . Warfarin - Pharmacist Dosing Inpatient   Does not apply q1800    Dialysis Orders: MWF South 3h 1min  71kg  2/2.25  LU HeRO  Hep 4000 Mircera3mcg q2wks - last 12/07/17 Hectorol50mcg IV qHD    Assessment/Plan: 1. RLE pain - anticoagulation started for presumed DVT. Heparin drip/warfarin per pharmacy. Hx PAD w/RLE resting pain s/p recent intervention by Dr. Lucky Cowboy on 7/18.  2. ESRD - HD MWF.  Next HD 7/29.   3. Hypertension/volume - BP's ok, no gross vol excess on exam. Possibly lower EDW. Post HD wt 7/26 70.4kg.  4. Anemia of CKD - Hgb 10.8  No indication for ESA  5. Secondary Hyperparathyroidism - Ca 9.0, Phos elevated.Continue binders and VDRA. 6. Nutrition - prostat for low albumin  7. Abd pain/constipation - CLs/stool softeners per primary  8. Hx seizure d/o - per primary 9. Hx depression/PTSD - per primary  Lynnda Child PA-C [place] Pager [phone] 12/26/2017,10:22 AM  Pt seen, examined and agree w A/P as above.  Kelly Splinter MD [employer] pager [phone]   12/26/2017, 10:34 AM

## 2017-12-27 DIAGNOSIS — I70211 Atherosclerosis of native arteries of extremities with intermittent claudication, right leg: Secondary | ICD-10-CM | POA: Diagnosis not present

## 2017-12-27 DIAGNOSIS — I82401 Acute embolism and thrombosis of unspecified deep veins of right lower extremity: Secondary | ICD-10-CM | POA: Diagnosis not present

## 2017-12-27 LAB — CBC
HCT: 35.3 % — ABNORMAL LOW (ref 36.0–46.0)
HEMOGLOBIN: 10.9 g/dL — AB (ref 12.0–15.0)
MCH: 27.9 pg (ref 26.0–34.0)
MCHC: 30.9 g/dL (ref 30.0–36.0)
MCV: 90.5 fL (ref 78.0–100.0)
PLATELETS: 214 10*3/uL (ref 150–400)
RBC: 3.9 MIL/uL (ref 3.87–5.11)
RDW: 15.9 % — ABNORMAL HIGH (ref 11.5–15.5)
WBC: 9.2 10*3/uL (ref 4.0–10.5)

## 2017-12-27 LAB — PROTIME-INR
INR: 2.35
PROTHROMBIN TIME: 25.5 s — AB (ref 11.4–15.2)

## 2017-12-27 LAB — HEPARIN LEVEL (UNFRACTIONATED): Heparin Unfractionated: 0.33 IU/mL (ref 0.30–0.70)

## 2017-12-27 MED ORDER — SENNOSIDES-DOCUSATE SODIUM 8.6-50 MG PO TABS: 1.0000 | ORAL_TABLET | Freq: Two times a day (BID) | ORAL | Status: AC

## 2017-12-27 MED ORDER — WARFARIN SODIUM 2.5 MG PO TABS: 2.5000 mg | ORAL_TABLET | Freq: Once | ORAL | Status: DC

## 2017-12-27 MED ORDER — WARFARIN SODIUM 5 MG PO TABS: ORAL_TABLET | ORAL | 1 refills | Status: DC

## 2017-12-27 MED ORDER — OXYCODONE-ACETAMINOPHEN 5-325 MG PO TABS: 1.0000 | ORAL_TABLET | ORAL | 0 refills | Status: AC | PRN

## 2017-12-27 NOTE — Progress Notes (Addendum)
ANTICOAGULATION CONSULT NOTE - Follow-Up  Pharmacy Consult for heparin Indication: DVT  Patient Measurements: Height: 5\' 1"  (154.9 cm) Weight: 156 lb 15.5 oz (71.2 kg) IBW/kg (Calculated) : 47.8 Heparin Dosing Weight: 63 kg  Vital Signs: Temp: 98.4 F (36.9 C) (07/28 0959) Temp Source: Oral (07/28 0959) BP: 178/49 (07/28 0959) Pulse Rate: 75 (07/28 0959)  Labs: Recent Labs    12/25/17 0639 12/25/17 1300 12/26/17 0601 12/27/17 0758  HGB 11.1*  --  10.8* 10.9*  HCT 35.1*  --  34.2* 35.3*  PLT 252  --  228 214  LABPROT 18.3*  --  21.4* 25.5*  INR 1.54  --  1.88 2.35  HEPARINUNFRC 0.57  --  0.35 0.33  CREATININE 8.52* 8.98*  --   --     Estimated Creatinine Clearance: 4.8 mL/min (A) (by C-G formula based on SCr of 8.98 mg/dL (H)).  Assessment: 7 YOF with hx of ESRD on HD, admitted for R leg pain, LE doppler showed questionable acute DVT in RLE, pharmacy is consulted to start heparin and warfarin.  Heparin level this morning remains therapeutic (HL 0.33 << 0.35, goal of 0.3-0.7). INR is therapeutic (INR 2.35 << 1.88, goal of 2-3). Hgb 10.9, plts wnl. No bleeding noted.   Today is VTE overlap D#5/5 however best practice is to continue the Heparin bridge until the INR has been >2 for 24 hours. Will continue the Heparin drip for now and plan to d/c on 7/29 AM if the INR remains >2. Will lower the warfarin dose today in response to the rise in INR.   If the patient discharges home today would recommend Warfarin 2.5 mg for today (7/28) then alternate 2.5 mg and 5 mg until the patient can be followed up at the coumadin clinic. Would recommend the patient to be followed up by Wed, 7/31   Goal of Therapy:  INR 2-3 Heparin level 0.3-0.7 units/ml Monitor platelets by anticoagulation protocol: Yes   Plan:  - Continue Heparin drip at 1050 units/hr - Warfarin 2.5 mg x 1 dose at 1800 today - Will continue to monitor for any signs/symptoms of bleeding and will follow up with heparin  level and PT/INR in the a.m.   Thank you for allowing pharmacy to be a part of this patient's care.  Alycia Rossetti, PharmD, BCPS Clinical Pharmacist Pager: [phone] Clinical phone for 12/27/2017 from 7a-3:30p: [phone] If after 3:30p, please call main pharmacy at: [phone] Please check AMION for all [place] numbers 12/27/2017 10:27 AM

## 2017-12-27 NOTE — Progress Notes (Signed)
Patient discharged home per MD. Discharge instructions reviewed with patient using teach back. Patient escorted out via wheelchair by NT. Bartholomew Crews, RN

## 2017-12-27 NOTE — Progress Notes (Addendum)
[facility] Progress Note   Subjective:    Seen walking hallways this morning. No complaints.   Objective Vitals:   12/26/17 1800 12/26/17 2042 12/27/17 0500 12/27/17 0521  BP: (!) 131/51 (!) 158/52  (!) 148/55  Pulse: 86 82  83  Resp: 18 18  16   Temp: 98.6 F (37 C) 98.6 F (37 C)  98.7 F (37.1 C)  TempSrc: Oral Oral  Oral  SpO2: 98% 93%  95%  Weight:  71.2 kg (156 lb 15.5 oz) 71.2 kg (156 lb 15.5 oz)   Height:       Physical Exam General: elderly female resting in bed NAD Heart: RRR Lungs:nml WOB Extremities:no LE edema appreciated Dialysis Access: LU HeRO   Filed Weights   12/26/17 0314 12/26/17 2042 12/27/17 0500  Weight: 71.2 kg (156 lb 15.5 oz) 71.2 kg (156 lb 15.5 oz) 71.2 kg (156 lb 15.5 oz)    Intake/Output Summary (Last 24 hours) at 12/27/2017 0948 Last data filed at 12/27/2017 0900 Gross per 24 hour  Intake 600 ml  Output 0 ml  Net 600 ml    Additional Objective Labs: Basic Metabolic Panel: Recent Labs  Lab 12/21/17 1309  12/24/17 0420 12/25/17 0639 12/25/17 1300  NA 136   < > 136 135 134*  K 5.8*   < > 4.4 4.1 4.6  CL 95*   < > 94* 92* 92*  CO2 24   < > 28 25 27   GLUCOSE 37*   < > 81 72 84  BUN 61*   < > 14 31* 28*  CREATININE 13.27*   < > 5.95* 8.52* 8.98*  CALCIUM 8.2*   < > 9.0 8.7* 8.7*  PHOS 7.7*  --   --   --  7.5*   < > = values in this interval not displayed.   Liver Function Tests: Recent Labs  Lab 12/21/17 0840 12/21/17 1309 12/25/17 1300  AST 19  --   --   ALT 10  --   --   ALKPHOS 82  --   --   BILITOT 0.6  --   --   PROT 7.2  --   --   ALBUMIN 2.6* 2.5* 2.4*   CBC: Recent Labs  Lab 12/21/17 0840  12/23/17 0444 12/24/17 0420 12/25/17 0639 12/26/17 0601 12/27/17 0758  WBC 12.4*   < > 8.3 11.5* 11.9* 10.5 9.2  NEUTROABS 9.3*  --   --   --   --   --   --   HGB 10.8*   < > 10.6* 12.7 11.1* 10.8* 10.9*  HCT 33.7*   < > 33.3* 39.8 35.1* 34.2* 35.3*  MCV 90.3   < > 90.0 89.4 90.2 89.8 90.5  PLT  215   < > 221 278 252 228 214   < > = values in this interval not displayed.    Cardiac Enzymes: Recent Labs  Lab 12/21/17 1309  CKTOTAL 46   CBG: Recent Labs  Lab 12/21/17 1045 12/21/17 1107 12/21/17 1129 12/21/17 1352 12/21/17 1601  GLUCAP 119* 117* 80 70 107*   Lab Results  Component Value Date   INR 2.35 12/27/2017   INR 1.88 12/26/2017   INR 1.54 12/25/2017   Studies/Results: Ct Head Wo Contrast  Result Date: 12/25/2017 CLINICAL DATA:  Diplopia EXAM: CT HEAD WITHOUT CONTRAST TECHNIQUE: Contiguous axial images were obtained from the base of the skull through the vertex without intravenous contrast. COMPARISON:  MRI 05/30/2017, CT brain 11/14/2013  FINDINGS: Brain: No acute territorial infarction, hemorrhage or intracranial mass. Encephalomalacia in the left parasagittal frontal lobe. Mild atrophy. Stable ventricle size. Vascular: No hyperdense vessels.  Carotid vascular calcification Skull: Prior bifrontal craniotomy. Sinuses/Orbits: Postsurgical changes of the left maxillary and ethmoid sinuses. Mucosal thickening within the ethmoid and maxillary sinuses. No acute orbital abnormality is seen Other: None IMPRESSION: 1. No CT evidence for acute intracranial abnormality. 2. Encephalomalacia in the left frontal lobe. Previous anterior craniotomy. Electronically Signed   By: Donavan Foil M.D.   On: 12/25/2017 19:38    Medications: . heparin 1,050 Units/hr (12/26/17 1955)   . allopurinol  100 mg Oral Once per day on Sun Tue Thu Sat  . amLODipine  10 mg Oral QHS  . atorvastatin  40 mg Oral QHS  . cinacalcet  90 mg Oral QHS  . clopidogrel  75 mg Oral Daily  . dicyclomine  10 mg Oral TID AC & HS  . doxercalciferol  1 mcg Intravenous Q M,W,F-HD  . DULoxetine  60 mg Oral QHS  . feeding supplement (NEPRO CARB STEADY)  237 mL Oral BID BM  . glycopyrrolate  2 mg Oral BID  . lidocaine-prilocaine  1 application Topical Once per day on Mon Wed Fri  . metoprolol succinate  100 mg  Oral BID  . multivitamin  1 tablet Oral QHS  . pantoprazole  20 mg Oral BID AC  . pregabalin  100 mg Oral QHS  . senna-docusate  1 tablet Oral BID  . sevelamer carbonate  2,400 mg Oral TID WC  . Warfarin - Pharmacist Dosing Inpatient   Does not apply q1800    Dialysis Orders: MWF South 3h 79min  71kg  2/2.25  LU HeRO  Hep 4000 Mircera58mcg q2wks - last 12/07/17 Hectorol5mcg IV qHD    Assessment/Plan: 1. RLE pain - anticoagulation started for acute DVT. Heparin drip/warfarin per pharmacy. Hx PAD w/RLE resting pain s/p recent intervention by Dr. Lucky Cowboy on 7/18.  2. ESRD - HD MWF.  Next HD 7/29.   3. Hypertension/volume - BP's ok, no gross vol excess on exam. Possibly lower EDW. Post HD wt on 7/26 -70.4kg.  4. Anemia of CKD - Hgb 10.9  No indication for ESA  5. Secondary Hyperparathyroidism - Ca ok, Phos elevated.Continue binders and VDRA. 6. Nutrition - prostat for low albumin  7. Abd pain/constipation - improving, stool softeners per primary  8. Hx seizure d/o - per primary 9. Hx depression/PTSD - per primary  Lynnda Child PA-C [facility] Pager [phone] 12/27/2017,9:48 AM  Pt seen, examined and agree w A/P as above.  Kelly Splinter MD [employer] pager [phone]   12/27/2017, 11:13 AM

## 2017-12-27 NOTE — Discharge Summary (Signed)
Physician Discharge Summary  [name] [id number]:[id number] DOB: 1941-04-02 DOA: 12/21/2017  PCP: Martinique, Betty G, MD  Admit date: 12/21/2017 Discharge date: 12/27/2017  Admitted From: Home Disposition: Home Recommendations for Outpatient Follow-up:  1. Follow up with PCP in 1-2 weeks 2. Please obtain BMP/CBC in one week 3. Obtain INR Monday, 12/28/2017 and call PCP for Coumadin orders. 4. Follow-up with ophthalmologist for complaints of double vision which has been resolved at this time.  Home Health: PT RN aide Equipment/Devices: None  Discharge Condition: Able CODE STATUS: Full code Diet recommendation: Renal Brief/Interim Summary:77 year old female with COPD, hypertension, ESRD on hemodialysis MWF, depression, chronic anemia, IBS, restless leg syndrome, PAD, neuropathy status post right superficial femoral artery stenting by Dr. Lucky Cowboy on 12/17/2017 presented with severe right lower extremity pain, sharp, intermittent, progressing since last Thursday 5 days ago. States tramadol did not help, unable to stand or get a wheelchair hence was unable to go to dialysis due to pain.      Discharge Diagnoses:  Principal Problem:   Right leg pain Active Problems:   HYPERCHOLESTEROLEMIA   Gout, unspecified   ANEMIA NEC   Anxiety and depression   TOBACCO ABUSE   GASTROESOPHAGEAL REFLUX, NO ESOPHAGITIS   CHF (congestive heart failure) (HCC)   Hypertension   Hyperkalemia   ESRD on dialysis [facility])   Chronic obstructive pulmonary disease (HCC)   PAD (peripheral artery disease) (HCC)  Right leg pain in the setting of PAD, chronic peripheral neuropathy, recent right superficial femoral artery stenting -No evidence of hematoma, Doppler ultrasound final readwasnegative for DVT, read by Dr. Marin Olp was seen by Dr. Donnetta Hutching in the ED on 7/22 and did not feel patient had active vascular disease.  -Patient continued to complained ofRLEpain, hence vascular surgery was reconsulted, seen by  Dr. Donnetta Hutching on 7/24, recommended that patient does have a DVT in RLEanticoagulation for 3 months.  Patient was started on IV heparin and Coumadin was also started.  Her INR on the day of discharge she is 2.5.  Patient is eager and anxious to be discharged.  Will DC heparin and continue Coumadin 5 mg Monday Wednesday Friday and 2.5 mg on all the other days.  She will check her INR tomorrow Monday, 12/28/2017.  She does have home health PT RN and aide.   Active Problems: Acute abdominal pain likely due to severe constipation-patient had large bowel movement yesterday currently she is free of abdominal pain and feeling and asking for regular food. -ESRD on hemodialysis, MWF, hyperkalemia-Nephrology consulted, patient started on hemodialysis per schedule  Mild acute metabolic encephalopathy-secondary to missed dialysis for 5 days.  There was a concern if this was due to oxycodone that she was taking at home.  I will be discharging her today on Percocet for the leg pain.  She reports tramadol is too weak and oxycodone is too strong.  I have discussed with her daughter at [phone] about monitoring her very closely on Percocet .  HYPERCHOLESTEROLEMIA -Continue statins   Gout, unspecified Continue allopurinol  Anemia of chronic disease/CKD -H&H currently stable   Anxiety and depression -Continue Cymbalta  GASTROESOPHAGEAL REFLUX, NO ESOPHAGITIS -Continue PPI  Diastolic chronic CHF (congestive heart failure) ([place]) -Prior echo in 07/24/2017 showed EF of 55 to 97%, grade 2 diastolic dysfunction -Currently stable, volume management with hemodialysis  Essential hypertension -BP stable, continue clonidine, amlodipine, beta-blocker.  Chronic obstructive pulmonary disease (HCC) -Stable, no wheezing  PAD (peripheral artery disease) (HCC) -Continue Plavix       Discharge  Instructions  Discharge Instructions    Call MD for:  difficulty breathing, headache or visual  disturbances   Complete by:  As directed    Call MD for:  persistant dizziness or light-headedness   Complete by:  As directed    Call MD for:  persistant nausea and vomiting   Complete by:  As directed    Call MD for:  severe uncontrolled pain   Complete by:  As directed    Call MD for:  temperature >100.4   Complete by:  As directed    Diet - low sodium heart healthy   Complete by:  As directed    Discharge instructions   Complete by:  As directed    Check INR 7/29.call PCP to adjust dose of coumadin.   Face-to-face encounter (required for Medicare/Medicaid patients)   Complete by:  As directed    [facility] certify that this patient is under my care and that I, or a nurse practitioner or physician's assistant working with me, had a face-to-face encounter that meets the physician face-to-face encounter requirements with this patient on 12/26/2017. The encounter with the patient was in whole, or in part for the following medical condition(s) which is the primary reason for home health care (List medical condition): DVT HTN   The encounter with the patient was in whole, or in part, for the following medical condition, which is the primary reason for home health care:  DVT ESRD   I certify that, based on my findings, the following services are medically necessary home health services:   Physical therapy Nursing     Reason for Medically Necessary Home Health Services:  Therapy- Personnel officer, Public librarian   My clinical findings support the need for the above services:  Unable to leave home safely without assistance and/or assistive device   Further, I certify that my clinical findings support that this patient is homebound due to:  Pain interferes with ambulation/mobility   Home Health   Complete by:  As directed    To provide the following care/treatments:  PT   Increase activity slowly   Complete by:  As directed      Allergies as of 12/27/2017       Reactions   Tuberculin Tests Hives   "blisters"   Valacyclovir Other (See Comments)   Confusion and nervousness   Codeine Nausea And Vomiting   Penicillins Rash   No problems breathing. Has tolerated omnicef in past without issue Has patient had a PCN reaction causing immediate rash, facial/tongue/throat swelling, SOB or lightheadedness with hypotension: Yes Has patient had a PCN reaction causing severe rash involving mucus membranes or skin necrosis: No Has patient had a PCN reaction that required hospitalization No Has patient had a PCN reaction occurring within the last 10 years: No If all of the above answers are "NO", then may proceed with Cephalosporin   Sulfamethoxazole Rash      Medication List    STOP taking these medications   acetaminophen 500 MG tablet Commonly known as:  TYLENOL   cloNIDine 0.2 MG tablet Commonly known as:  CATAPRES   dicyclomine 10 MG capsule Commonly known as:  BENTYL   multivitamin tablet   Pramoxine-Benzyl Alcohol 1-10 % Gel Commonly known as:  ITCH-X   traMADol 50 MG tablet Commonly known as:  ULTRAM     TAKE these medications   albuterol 108 (90 Base) MCG/ACT inhaler Commonly known as:  PROVENTIL HFA;VENTOLIN HFA  Inhale 2 puffs into the lungs every 4 (four) hours as needed for wheezing or shortness of breath.   allopurinol 100 MG tablet Commonly known as:  ZYLOPRIM Take 100 mg by mouth once daily on non-dialysis days (Tues/Thurs/Sat/Sun) What changed:    how much to take  how to take this  when to take this  additional instructions   amLODipine 10 MG tablet Commonly known as:  NORVASC TAKE 1 TABLET BY MOUTH AT BEDTIME What changed:    how much to take  how to take this  when to take this   aspirin EC 81 MG tablet Take 81 mg by mouth daily.   atorvastatin 40 MG tablet Commonly known as:  LIPITOR Take 1 tablet (40 mg total) by mouth daily. What changed:  when to take this   azelastine 0.1 % nasal  spray Commonly known as:  ASTELIN Place 1 spray into both nostrils 2 (two) times daily. Use in each nostril as directed What changed:    when to take this  reasons to take this  additional instructions   budesonide-formoterol 160-4.5 MCG/ACT inhaler Commonly known as:  SYMBICORT Inhale 2 puffs into the lungs 2 (two) times daily.   cinacalcet 90 MG tablet Commonly known as:  SENSIPAR Take 90 mg by mouth at bedtime.   clopidogrel 75 MG tablet Commonly known as:  PLAVIX Take 1 tablet (75 mg total) by mouth daily.   DIALYVITE/ZINC Tabs Take 1 tablet by mouth daily.   DULoxetine 30 MG capsule Commonly known as:  CYMBALTA Take 2 capsules (60 mg total) by mouth daily. What changed:  when to take this   fluticasone 50 MCG/ACT nasal spray Commonly known as:  FLONASE Place 2 sprays into both nostrils daily as needed for allergies or rhinitis.   glycopyrrolate 2 MG tablet Commonly known as:  ROBINUL Take 1 tablet (2 mg total) by mouth 2 (two) times daily.   hydrOXYzine 25 MG tablet Commonly known as:  ATARAX/VISTARIL Take 1 tablet (25 mg total) by mouth every 8 (eight) hours as needed for itching.   lidocaine-prilocaine cream Commonly known as:  EMLA Apply 1 application topically See admin instructions. Apply topically one hour before dialysis - Monday, Wednesday, Friday   loratadine 10 MG tablet Commonly known as:  CLARITIN 10 mg every other day. What changed:    how much to take  how to take this  when to take this  reasons to take this  additional instructions   losartan 100 MG tablet Commonly known as:  COZAAR Take 1 tablet (100 mg total) by mouth daily.   metoprolol succinate 100 MG 24 hr tablet Commonly known as:  TOPROL-XL TAKE 1 TABLET (100 MG)  BY MOUTH TWICE DAILY   multivitamin Tabs tablet Take 1 tablet by mouth at bedtime. What changed:  when to take this   oxyCODONE-acetaminophen 5-325 MG tablet Commonly known as:  PERCOCET Take 1 tablet  by mouth every 4 (four) hours as needed for up to 7 days for severe pain.   pantoprazole 20 MG tablet Commonly known as:  PROTONIX Take 1 tablet (20 mg total) by mouth 2 (two) times daily before a meal.   pregabalin 100 MG capsule Commonly known as:  LYRICA Take 100 mg by mouth at bedtime.   senna-docusate 8.6-50 MG tablet Commonly known as:  Senokot-S Take 1 tablet by mouth 2 (two) times daily.   sevelamer carbonate 800 MG tablet Commonly known as:  RENVELA Take 2,400 mg by mouth  See admin instructions. Take 3 tablets (2400 mg) by mouth with each meal or snack - once or twice daily   warfarin 5 MG tablet Commonly known as:  COUMADIN Take 5 mg Monday Wednesday Friday and 2.5 mg all other days.check inr 7/29.      Follow-up Information    [facility] Follow up.   Specialty:  Home Health Services Why:  Home health services arranged Contact information: [address] [place] [address] [phone]          Allergies  Allergen Reactions  . Tuberculin Tests Hives    "blisters"  . Valacyclovir Other (See Comments)    Confusion and nervousness  . Codeine Nausea And Vomiting  . Penicillins Rash    No problems breathing. Has tolerated omnicef in past without issue Has patient had a PCN reaction causing immediate rash, facial/tongue/throat swelling, SOB or lightheadedness with hypotension: Yes Has patient had a PCN reaction causing severe rash involving mucus membranes or skin necrosis: No Has patient had a PCN reaction that required hospitalization No Has patient had a PCN reaction occurring within the last 10 years: No If all of the above answers are "NO", then may proceed with Cephalosporin  . Sulfamethoxazole Rash    Consultations:    Procedures/Studies: Ct Head Wo Contrast  Result Date: 12/25/2017 CLINICAL DATA:  Diplopia EXAM: CT HEAD WITHOUT CONTRAST TECHNIQUE: Contiguous axial images were obtained from the base of the skull  through the vertex without intravenous contrast. COMPARISON:  MRI 05/30/2017, CT brain 11/14/2013 FINDINGS: Brain: No acute territorial infarction, hemorrhage or intracranial mass. Encephalomalacia in the left parasagittal frontal lobe. Mild atrophy. Stable ventricle size. Vascular: No hyperdense vessels.  Carotid vascular calcification Skull: Prior bifrontal craniotomy. Sinuses/Orbits: Postsurgical changes of the left maxillary and ethmoid sinuses. Mucosal thickening within the ethmoid and maxillary sinuses. No acute orbital abnormality is seen Other: None IMPRESSION: 1. No CT evidence for acute intracranial abnormality. 2. Encephalomalacia in the left frontal lobe. Previous anterior craniotomy. Electronically Signed   By: Donavan Foil M.D.   On: 12/25/2017 19:38   Dg Chest Port 1 View  Result Date: 12/21/2017 CLINICAL DATA:  77 year old female with acute chest pain today. EXAM: PORTABLE CHEST 1 VIEW COMPARISON:  08/20/2017 and earlier. FINDINGS: Portable AP semi upright view at 0743 hours. Left side HERO graft hemodialysis access redemonstrated. Visible portions appear stable. Chronic elevation of the right hemidiaphragm is stable. Stable mild cardiomegaly and mediastinal contours. No pneumothorax, pulmonary edema, pleural effusion or consolidation. Mild bibasilar atelectasis appears stable. Paucity of bowel gas in the upper abdomen. Visualized tracheal air column is within normal limits. No acute osseous abnormality identified. IMPRESSION: Chronic bibasilar atelectasis. No acute cardiopulmonary abnormality. Electronically Signed   By: Genevie Ann M.D.   On: 12/21/2017 08:04   Dg Abd 2 Views  Result Date: 12/24/2017 CLINICAL DATA:  Abdominal pain. EXAM: ABDOMEN - 2 VIEW COMPARISON:  CT abdomen and pelvis 08/20/2017 FINDINGS: No intraperitoneal free air is identified. There is stool throughout the colon, with a large amount of stool noted in the ascending colon and rectum. No dilated loops of bowel are seen to  suggest obstruction. Atherosclerotic vascular calcifications and pelvic surgical clips are noted. A HERO graft is noted in the chest. Bibasilar lung opacities are similar to a recent chest radiograph from 12/21/2017 and likely represent atelectasis. IMPRESSION: Large amount of colonic and rectal stool. No evidence of bowel obstruction. Electronically Signed   By: Seymour Bars.D.  On: 12/24/2017 10:01    (Echo, Carotid, EGD, Colonoscopy, ERCP)    Subjective: Ambulating in hallway denies any chest pain leg pain nausea vomiting constipation or diarrhea.   Discharge Exam: Vitals:   12/27/17 0521 12/27/17 0959  BP: (!) 148/55 (!) 178/49  Pulse: 83 75  Resp: 16 18  Temp: 98.7 F (37.1 C) 98.4 F (36.9 C)  SpO2: 95% 100%   Vitals:   12/26/17 2042 12/27/17 0500 12/27/17 0521 12/27/17 0959  BP: (!) 158/52  (!) 148/55 (!) 178/49  Pulse: 82  83 75  Resp: 18  16 18   Temp: 98.6 F (37 C)  98.7 F (37.1 C) 98.4 F (36.9 C)  TempSrc: Oral  Oral Oral  SpO2: 93%  95% 100%  Weight: 71.2 kg (156 lb 15.5 oz) 71.2 kg (156 lb 15.5 oz)    Height:        General: Pt is alert, awake, not in acute distress Cardiovascular: RRR, S1/S2 +, no rubs, no gallops Respiratory: CTA bilaterally, no wheezing, no rhonchi Abdominal: Soft, NT, ND, bowel sounds + Extremities: no edema, no cyanosis    The results of significant diagnostics from this hospitalization (including imaging, microbiology, ancillary and laboratory) are listed below for reference.     Microbiology: Recent Results (from the past 240 hour(s))  MRSA PCR Screening     Status: None   Collection Time: 12/21/17  6:12 PM  Result Value Ref Range Status   MRSA by PCR NEGATIVE NEGATIVE Final    Comment:        The GeneXpert MRSA Assay (FDA approved for NASAL specimens only), is one component of a comprehensive MRSA colonization surveillance program. It is not intended to diagnose MRSA infection nor to guide or monitor treatment  for MRSA infections. Performed at [facility], [place] [address] [place],  [address]      Labs: BNP (last 3 results) Recent Labs    07/23/17 1928  BNP 096.2*   Basic Metabolic Panel: Recent Labs  Lab 12/21/17 1309 12/22/17 0544 12/23/17 0444 12/24/17 0420 12/25/17 0639 12/25/17 1300  NA 136 138 137 136 135 134*  K 5.8* 4.6 5.4* 4.4 4.1 4.6  CL 95* 97* 95* 94* 92* 92*  CO2 24 28 27 28 25 27   GLUCOSE 37* 74 86 81 72 84  BUN 61* 20 30* 14 31* 28*  CREATININE 13.27* 7.02* 9.19* 5.95* 8.52* 8.98*  CALCIUM 8.2* 8.1* 8.3* 9.0 8.7* 8.7*  PHOS 7.7*  --   --   --   --  7.5*   Liver Function Tests: Recent Labs  Lab 12/21/17 0840 12/21/17 1309 12/25/17 1300  AST 19  --   --   ALT 10  --   --   ALKPHOS 82  --   --   BILITOT 0.6  --   --   PROT 7.2  --   --   ALBUMIN 2.6* 2.5* 2.4*   No results for input(s): LIPASE, AMYLASE in the last 168 hours. No results for input(s): AMMONIA in the last 168 hours. CBC: Recent Labs  Lab 12/21/17 0840  12/23/17 0444 12/24/17 0420 12/25/17 0639 12/26/17 0601 12/27/17 0758  WBC 12.4*   < > 8.3 11.5* 11.9* 10.5 9.2  NEUTROABS 9.3*  --   --   --   --   --   --   HGB 10.8*   < > 10.6* 12.7 11.1* 10.8* 10.9*  HCT 33.7*   < > 33.3* 39.8 35.1*  34.2* 35.3*  MCV 90.3   < > 90.0 89.4 90.2 89.8 90.5  PLT 215   < > 221 278 252 228 214   < > = values in this interval not displayed.   Cardiac Enzymes: Recent Labs  Lab 12/21/17 1309  CKTOTAL 46   BNP: Invalid input(s): POCBNP CBG: Recent Labs  Lab 12/21/17 1045 12/21/17 1107 12/21/17 1129 12/21/17 1352 12/21/17 1601  GLUCAP 119* 117* 80 70 107*   D-Dimer No results for input(s): DDIMER in the last 72 hours. Hgb A1c No results for input(s): HGBA1C in the last 72 hours. Lipid Profile No results for input(s): CHOL, HDL, LDLCALC, TRIG, CHOLHDL, LDLDIRECT in the last 72 hours. Thyroid function studies No results for input(s): TSH, T4TOTAL, T3FREE, THYROIDAB in the  last 72 hours.  Invalid input(s): FREET3 Anemia work up No results for input(s): VITAMINB12, FOLATE, FERRITIN, TIBC, IRON, RETICCTPCT in the last 72 hours. Urinalysis    Component Value Date/Time   COLORURINE YELLOW 12/30/2014 1419   APPEARANCEUR CLEAR 12/30/2014 1419   LABSPEC 1.009 12/30/2014 1419   PHURINE 7.5 12/30/2014 1419   GLUCOSEU NEGATIVE 12/30/2014 1419   HGBUR NEGATIVE 12/30/2014 1419   HGBUR negative 03/23/2007 1112   BILIRUBINUR NEGATIVE 12/30/2014 1419   KETONESUR NEGATIVE 12/30/2014 1419   PROTEINUR 100 (A) 12/30/2014 1419   UROBILINOGEN 0.2 12/30/2014 1419   NITRITE NEGATIVE 12/30/2014 1419   LEUKOCYTESUR NEGATIVE 12/30/2014 1419   Sepsis Labs Invalid input(s): PROCALCITONIN,  WBC,  LACTICIDVEN Microbiology Recent Results (from the past 240 hour(s))  MRSA PCR Screening     Status: None   Collection Time: 12/21/17  6:12 PM  Result Value Ref Range Status   MRSA by PCR NEGATIVE NEGATIVE Final    Comment:        The GeneXpert MRSA Assay (FDA approved for NASAL specimens only), is one component of a comprehensive MRSA colonization surveillance program. It is not intended to diagnose MRSA infection nor to guide or monitor treatment for MRSA infections. Performed at [facility], [place] [address], [place], [place] [address]      Time coordinating discharge: 33  minutes  SIGNED:   Georgette Shell, MD  [facility] 12/27/2017, 11:44 AM Pager   If 7PM-7AM, please contact night-coverage [email or web address] Password [email or web address]

## 2017-12-28 ENCOUNTER — Telehealth: Payer: Self-pay | Admitting: Family Medicine

## 2017-12-28 DIAGNOSIS — Z992 Dependence on renal dialysis: Secondary | ICD-10-CM | POA: Diagnosis not present

## 2017-12-28 DIAGNOSIS — N186 End stage renal disease: Secondary | ICD-10-CM | POA: Diagnosis not present

## 2017-12-28 DIAGNOSIS — N2581 Secondary hyperparathyroidism of renal origin: Secondary | ICD-10-CM | POA: Diagnosis not present

## 2017-12-28 DIAGNOSIS — D631 Anemia in chronic kidney disease: Secondary | ICD-10-CM | POA: Diagnosis not present

## 2017-12-28 NOTE — Telephone Encounter (Signed)
I spoke with daugher Joseph Art( on DPR ), pt was recently in the hospital- there were 3 medications that were discontinued ASA, Plavix, & Prilosec. Pt wants to make sure that is okay with Dr. Martinique to stay off of these, she has now started on Coumadin. There is another phone note asking about nursing orders & Coumadin checks, patient had a nurse visit yesterday. Renee got a call from dialysis earlier today, patient expeirenced chest discomfort during dialysis. She was assessed while there and nothing further was needed at that time. Also looks like patient will need a hospital follow up with Dr. Martinique. Please advise?

## 2017-12-28 NOTE — Telephone Encounter (Signed)
I left a voice message for pt to return my call.  

## 2017-12-28 NOTE — Telephone Encounter (Signed)
Message sent to Dr. Jordan for review and approval. 

## 2017-12-28 NOTE — Telephone Encounter (Signed)
Copied from [place] [phone]. Topic: Quick Communication - See Telephone Encounter >> Dec 28, 2017 12:11 PM Synthia Innocent wrote: CRM for notification. See Telephone encounter for: 12/28/17. Daughter calling, would like to speak with CMA regarding meds. Patient was discharged from hospital yesterday and they want to make sure meds are correct.

## 2017-12-28 NOTE — Telephone Encounter (Signed)
Patient is on the TCM list to call, I didn't see any openings for a 30 minute hospital follow up, please advice on this as well.

## 2017-12-28 NOTE — Telephone Encounter (Signed)
Copied from [place] [phone]. Topic: General - Other >> Dec 28, 2017  9:57 AM Margot Ables wrote: Pt had hospital admission for pain in R leg. Pt is now home. Needing VO for SN to see pt 1x week for 4 weeks. Pt was prescribed new med of coumadin. Would Dr. Martinique wants INR checked in the home and on what schedule to check. Pt will have another visit this Wednesday 12/30/17. Reconciling meds - pt states she does not take hydrOXYzine, ROBINUL, or claritin. Ok to leave detailed msg if not able to answer.

## 2017-12-29 ENCOUNTER — Telehealth (INDEPENDENT_AMBULATORY_CARE_PROVIDER_SITE_OTHER): Payer: Self-pay

## 2017-12-29 ENCOUNTER — Ambulatory Visit (HOSPITAL_BASED_OUTPATIENT_CLINIC_OR_DEPARTMENT_OTHER)
Admit: 2017-12-29 | Discharge: 2017-12-29 | Disposition: A | Discharge status: Home / Self Care | Payer: Medicare Other | Attending: Emergency Medicine | Admitting: Emergency Medicine

## 2017-12-29 ENCOUNTER — Emergency Department (HOSPITAL_COMMUNITY)
Admission: EM | Admit: 2017-12-29 | Discharge: 2017-12-29 | Disposition: A | Discharge status: Home / Self Care | Payer: Medicare Other | Attending: Emergency Medicine | Admitting: Emergency Medicine

## 2017-12-29 ENCOUNTER — Telehealth: Payer: Self-pay | Admitting: Family Medicine

## 2017-12-29 ENCOUNTER — Ambulatory Visit: Payer: Self-pay | Admitting: Family Medicine

## 2017-12-29 ENCOUNTER — Encounter (HOSPITAL_COMMUNITY): Payer: Self-pay

## 2017-12-29 DIAGNOSIS — M79609 Pain in unspecified limb: Secondary | ICD-10-CM

## 2017-12-29 DIAGNOSIS — M7989 Other specified soft tissue disorders: Secondary | ICD-10-CM | POA: Diagnosis not present

## 2017-12-29 DIAGNOSIS — M79604 Pain in right leg: Secondary | ICD-10-CM | POA: Diagnosis not present

## 2017-12-29 DIAGNOSIS — R609 Edema, unspecified: Secondary | ICD-10-CM | POA: Diagnosis not present

## 2017-12-29 DIAGNOSIS — N186 End stage renal disease: Secondary | ICD-10-CM | POA: Insufficient documentation

## 2017-12-29 DIAGNOSIS — R0902 Hypoxemia: Secondary | ICD-10-CM | POA: Diagnosis not present

## 2017-12-29 DIAGNOSIS — Z79899 Other long term (current) drug therapy: Secondary | ICD-10-CM | POA: Diagnosis not present

## 2017-12-29 DIAGNOSIS — F17210 Nicotine dependence, cigarettes, uncomplicated: Secondary | ICD-10-CM | POA: Insufficient documentation

## 2017-12-29 DIAGNOSIS — J449 Chronic obstructive pulmonary disease, unspecified: Secondary | ICD-10-CM | POA: Insufficient documentation

## 2017-12-29 DIAGNOSIS — R52 Pain, unspecified: Secondary | ICD-10-CM | POA: Diagnosis not present

## 2017-12-29 DIAGNOSIS — I132 Hypertensive heart and chronic kidney disease with heart failure and with stage 5 chronic kidney disease, or end stage renal disease: Secondary | ICD-10-CM | POA: Diagnosis not present

## 2017-12-29 DIAGNOSIS — I5022 Chronic systolic (congestive) heart failure: Secondary | ICD-10-CM | POA: Insufficient documentation

## 2017-12-29 DIAGNOSIS — I82401 Acute embolism and thrombosis of unspecified deep veins of right lower extremity: Secondary | ICD-10-CM | POA: Diagnosis not present

## 2017-12-29 DIAGNOSIS — I959 Hypotension, unspecified: Secondary | ICD-10-CM | POA: Diagnosis not present

## 2017-12-29 DIAGNOSIS — I70211 Atherosclerosis of native arteries of extremities with intermittent claudication, right leg: Secondary | ICD-10-CM | POA: Diagnosis not present

## 2017-12-29 LAB — COMPREHENSIVE METABOLIC PANEL
ALBUMIN: 2.6 g/dL — AB (ref 3.5–5.0)
ALT: 10 U/L (ref 0–44)
AST: 20 U/L (ref 15–41)
Alkaline Phosphatase: 66 U/L (ref 38–126)
Anion gap: 12 (ref 5–15)
BILIRUBIN TOTAL: 0.8 mg/dL (ref 0.3–1.2)
BUN: 14 mg/dL (ref 8–23)
CHLORIDE: 99 mmol/L (ref 98–111)
CO2: 29 mmol/L (ref 22–32)
Calcium: 8.7 mg/dL — ABNORMAL LOW (ref 8.9–10.3)
Creatinine, Ser: 6.66 mg/dL — ABNORMAL HIGH (ref 0.44–1.00)
GFR calc Af Amer: 6 mL/min — ABNORMAL LOW (ref >60)
GFR calc non Af Amer: 5 mL/min — ABNORMAL LOW (ref >60)
Glucose, Bld: 81 mg/dL (ref 70–99)
POTASSIUM: 3.8 mmol/L (ref 3.5–5.1)
Sodium: 140 mmol/L (ref 135–145)
Total Protein: 7 g/dL (ref 6.5–8.1)

## 2017-12-29 LAB — CBC WITH DIFFERENTIAL/PLATELET
Abs Immature Granulocytes: 0.2 10*3/uL — ABNORMAL HIGH (ref 0.0–0.1)
Basophils Absolute: 0.1 10*3/uL (ref 0.0–0.1)
Basophils Relative: 0 %
EOS ABS: 0.5 10*3/uL (ref 0.0–0.7)
Eosinophils Relative: 4 %
HEMATOCRIT: 37.3 % (ref 36.0–46.0)
Hemoglobin: 11.4 g/dL — ABNORMAL LOW (ref 12.0–15.0)
Immature Granulocytes: 1 %
LYMPHS ABS: 1.2 10*3/uL (ref 0.7–4.0)
Lymphocytes Relative: 11 %
MCH: 28.4 pg (ref 26.0–34.0)
MCHC: 30.6 g/dL (ref 30.0–36.0)
MCV: 92.8 fL (ref 78.0–100.0)
Monocytes Absolute: 1.5 10*3/uL — ABNORMAL HIGH (ref 0.1–1.0)
Monocytes Relative: 14 %
NEUTROS PCT: 70 %
Neutro Abs: 7.8 10*3/uL — ABNORMAL HIGH (ref 1.7–7.7)
PLATELETS: 229 10*3/uL (ref 150–400)
RBC: 4.02 MIL/uL (ref 3.87–5.11)
RDW: 17 % — AB (ref 11.5–15.5)
WBC: 11.2 10*3/uL — ABNORMAL HIGH (ref 4.0–10.5)

## 2017-12-29 LAB — I-STAT CG4 LACTIC ACID, ED: LACTIC ACID, VENOUS: 1.51 mmol/L (ref 0.5–1.9)

## 2017-12-29 LAB — PROTIME-INR
INR: 3.67
Prothrombin Time: 36.2 seconds — ABNORMAL HIGH (ref 11.4–15.2)

## 2017-12-29 LAB — APTT: aPTT: 88 seconds — ABNORMAL HIGH (ref 24–36)

## 2017-12-29 MED ORDER — OXYCODONE-ACETAMINOPHEN 5-325 MG PO TABS
1.0000 | ORAL_TABLET | Freq: Once | ORAL | Status: AC
  Administered 2017-12-29: 1 via ORAL
  Filled 2017-12-29: qty 1

## 2017-12-29 MED ORDER — OXYCODONE-ACETAMINOPHEN 5-325 MG PO TABS
0.5000 | ORAL_TABLET | Freq: Once | ORAL | Status: AC
  Administered 2017-12-29: 0.5 via ORAL
  Filled 2017-12-29: qty 1

## 2017-12-29 MED ORDER — POLYETHYLENE GLYCOL 3350 17 GM/SCOOP PO POWD: 17.0000 g | Freq: Every day | ORAL | 0 refills | Status: AC

## 2017-12-29 NOTE — Telephone Encounter (Signed)
Copied from [place] [phone]. Topic: Quick Communication - See Telephone Encounter >> Dec 29, 2017  2:11 PM Burchel, Abbi R wrote: CRM for notification. See Telephone encounter for: 12/29/17.   V/O  PT 1x6wk  [phone]

## 2017-12-29 NOTE — Telephone Encounter (Signed)
I spoke with patient and inform her with [name] medical advise and at this time she will continue with the hospital instructions as directed

## 2017-12-29 NOTE — Progress Notes (Signed)
Right lower extremity venous duplex has been completed. Negative for DVT.  12/29/17 5:07 PM [name] RVT

## 2017-12-29 NOTE — Telephone Encounter (Signed)
Message forwarded to Sunday Spillers, [place] for completion.

## 2017-12-29 NOTE — ED Triage Notes (Addendum)
Pt presents with increased pain and swelling to R leg.. Pt had 2 stents placed to R leg on 8/18, was diagnosed with DVT 3-4 days later.  Pt has been transitioned to coumadin with INR 2.2 on Sunday.  PT was at home today to assess, called PCP who referred her here.  Pt is on HD, received full treatment yesterday of which she reports 3 episodes of shortness of breath that required oxygen.

## 2017-12-29 NOTE — Telephone Encounter (Signed)
Sree from [place] [facility] called to report pt's right leg swelling and pain at calf area. Sree stated that when he touched her calf she withdrew due to the pain. Edema to right lower extremity is severe. 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 Pt is on coumadin therapy for the DVT.  Pt has h/o kidney disease and goes to hemodialysis Monday, Wednesday and Friday. Denies chest pain or difficulty breathing. Called PCP office and spoke with Sunday Spillers who stated pt needs to go to ED to evaluate if there is another DVT or if the DVT she currently has moved.  Pt advised to go to the ED for evaluation. Daughter will be taking her.  Reason for Disposition . SEVERE leg swelling (e.g., swelling extends above knee, entire leg is swollen, weeping fluid) . [1] Thigh or calf pain AND [2] only 1 side AND [3] present > 1 hour  Additional Information . Negative: [1] Swelling is painful to touch AND [2] fever    No fever  Answer Assessment - Initial Assessment Questions 1. ONSET: "When did the swelling start?" (e.g., minutes, hours, days)     12/17/17 2. LOCATION: "What part of the leg is swollen?"  "Are both legs swollen or just one leg?"     Right lower extremity from hip down 2+ swelling 3. SEVERITY: "How bad is the swelling?" (e.g., localized; mild, moderate, severe)  - Localized - small area of swelling localized to one leg  - MILD pedal edema - swelling limited to foot and ankle, pitting edema < 1/4 inch (6 mm) deep, rest and elevation eliminate most or all swelling  - MODERATE edema - swelling of lower leg to knee, pitting edema > 1/4 inch (6 mm) deep, rest and elevation only partially reduce swelling  - SEVERE edema - swelling extends above knee, facial or hand swelling present      Severe  4. REDNESS: "Does the swelling look red or infected?"     No color changes 5. PAIN: "Is the swelling painful to touch?" If so, ask: "How painful is it?"   (Scale 1-10; mild, moderate or  severe)     Yes severe pain 6. FEVER: "Do you have a fever?" If so, ask: "What is it, how was it measured, and when did it start?"      no 7. CAUSE: "What do you think is causing the leg swelling?"     DVT 8. MEDICAL HISTORY: "Do you have a history of heart failure, kidney disease, liver failure, or cancer?"     On hemodialysis M, W, F,  9. RECURRENT SYMPTOM: "Have you had leg swelling before?" If so, ask: "When was the last time?" "What happened that time?"     Artery had closed to and on 18th had 2 stents placecd 10. OTHER SYMPTOMS: "Do you have any other symptoms?" (e.g., chest pain, difficulty breathing)       no 11. PREGNANCY: "Is there any chance you are pregnant?" "When was your last menstrual period?"       n/a  Protocols used: LEG SWELLING AND EDEMA-A-AH

## 2017-12-29 NOTE — Telephone Encounter (Signed)
I gave verbal okay for orders.

## 2017-12-29 NOTE — ED Provider Notes (Signed)
Patient placed in Quick Look pathway, seen and evaluated   Chief Complaint: leg pain/swelling  HPI:   Pt had arterial stents placed on lower extremities 07/18. 3 days leg, worsened leg pain. Diagnosed with DVTs, started on heparin and switched to coumadin. discharged from the hospital 2-3 days ago. Has had worsened leg pain and swelling. Denies SOB or CP with me, but reported to RN that she has sob. Has associated abd pain, has not had a BM in 2 wks. percocet helps her pain, last dose last night at 8pm. PMH significant for ESRD on dialysis, HLD, diverticulitis, htn, chf, ibs, copd, cad, pad, tia.  ROS: leg pain/swelling, constipation  Physical Exam:   Gen: No distress  Neuro: Awake and Alert  Skin: Warm    Focused Exam: R leg swelling and tender. Incision site on R groin without significant drainage or redness.   Abd: TTP of abd generalized.   CV/pulm: RRR, CTAB  RN informed pt to be roomed next.  Initiation of care has begun. The patient has been counseled on the process, plan, and necessity for staying for the completion/evaluation, and the remainder of the medical screening examination    Franchot Heidelberg, PA-C 12/29/17 1607    Jola Schmidt, MD 12/29/17 1747

## 2017-12-29 NOTE — Telephone Encounter (Signed)
This is the reason it is important to arrange follow-up appointment after hospitalization in order to review medications and determine the need of continuing or resuming some. Can we please arrange a hospital F/U appt.  No changes in current medications for now.  Thanks, BJ

## 2017-12-29 NOTE — Telephone Encounter (Signed)
I spoke with pt and gave message from Dr. Martinique, pt will schedule visit with in next week or so.

## 2017-12-29 NOTE — Telephone Encounter (Signed)
Please have appointment scheduled for hospital follow-up and to address her concerns. Thanks, BJ.

## 2017-12-29 NOTE — Discharge Instructions (Addendum)
Elevate your leg  Please call your vascular surgeon for follow up

## 2017-12-29 NOTE — Telephone Encounter (Signed)
It is okay to give verbal authorization for the INR and [facility] services as requested. INR goal from 2-3. No changes in current dose of Coumadin. She should have a hospital follow-up within the next 10 days.   Thanks, BJ

## 2017-12-29 NOTE — ED Notes (Addendum)
Ultrasound at the bedside

## 2017-12-29 NOTE — ED Notes (Signed)
Patient verbalizes understanding of discharge instructions. Opportunity for questioning and answers were provided. Armband removed by staff, pt discharged from ED.  

## 2017-12-29 NOTE — Telephone Encounter (Signed)
I spoke with pt and went over information from Dr. Martinique. She will have the home health nurse call today for verbal orders and will check her calendar to schedule for hospital follow up visit.

## 2017-12-29 NOTE — Telephone Encounter (Signed)
Transition Care Management Follow-up Telephone Call  [name] LKT:[id number] DOB: 1941/02/04 DOA: 12/21/2017  PCP: Martinique, Betty G, MD  Admit date: 12/21/2017 Discharge date: 12/27/2017  Admitted From: Home Disposition: Home Recommendations for Outpatient Follow-up:  1. Follow up with PCP in 1-2 weeks 2. Please obtain BMP/CBC in one week 3. Obtain INR Monday, 12/28/2017 and call PCP for Coumadin orders. 4. Follow-up with ophthalmologist for complaints of double vision which has been resolved at this time.  Home Health: PT RN aide Equipment/Devices: None  Discharge Condition: Able CODE STATUS: Full code Diet recommendation: Renal Brief/Interim Summary:77 year old female with COPD, hypertension, ESRD on hemodialysis MWF, depression, chronic anemia, IBS, restless leg syndrome, PAD, neuropathy status post right superficial femoral artery stenting by Dr. Lucky Cowboy on 12/17/2017 presented with severe right lower extremity pain, sharp, intermittent, progressing since last Thursday 5 days ago. States tramadol did not help, unable to stand or get a wheelchair hence was unable to go to dialysis due to pain.   How have you been since you were released from the hospital? "doing okay"   Do you understand why you were in the hospital? yes   Do you understand the discharge instructions? yes   Where were you discharged to? Home   Items Reviewed:  Medications reviewed: yes  Allergies reviewed: yes  Dietary changes reviewed: yes  Referrals reviewed: yes   Functional Questionnaire:   Activities of Daily Living (ADLs):   She states they are independent in the following: none States they require assistance with the following: ambulation, bathing and hygiene, feeding, continence, grooming, toileting and dressing   Any transportation issues/concerns?: no   Any patient concerns? no   Confirmed importance and date/time of follow-up visits scheduled no, pt is trying to get  appointment, however her schedule is full right now, does dialysis 3 days a week.   Provider Appointment booked with- she will call back to schedule office visit.   Confirmed with patient if condition begins to worsen call PCP or go to the ER.  Patient was given the office number and encouraged to call back with question or concerns.  : yes

## 2017-12-29 NOTE — Telephone Encounter (Signed)
Pt does not have a hospital follow up scheduled yet. There were 3 medications that was stopped during hospital stay-ASA, Plavix & Prilosec. Patient wants to know if she will stay off of these while on the Coumadin? We have currently removed from medication list. She is not sure when she can come to office for a visit and needs to know what to do please?

## 2017-12-29 NOTE — Telephone Encounter (Signed)
As per EPIC notes, it looks like the patient's primary care physician is managing her INR/Coumadin dosing.  The patient was seen by the vascular surgery team at [facility].  She should continue to follow-up with them as per their instructions.  If the patient prefers to follow-up with [place], we should put her on the schedule for a DVT study in 3 months.  The patient will be on anticoagulation for at least 6 months.  The patient can start wearing compression socks in approximately 6 weeks.  The patient can either stop in our office to pick up her prescription or we can mail to her.  The patient should elevate her legs if she is experiencing swelling.  The patient should also try to remain active however not engage in any strenuous activity.

## 2017-12-29 NOTE — Telephone Encounter (Signed)
Monitor for ER arrival 

## 2017-12-29 NOTE — Telephone Encounter (Signed)
It is okay to give verbal authorization for PT as requested. Thanks, BJ 

## 2017-12-29 NOTE — ED Provider Notes (Signed)
[facility] EMERGENCY DEPARTMENT Provider Note   CSN: [id number] Arrival date & time: 12/29/17  1529     History   Chief Complaint No chief complaint on file.   HPI [name] is a 77 y.o. female.  HPI Patient is a 77 year old female presents to the emergency department with ongoing and persistent right lower leg pain and swelling.  No fevers or chills.  She is currently on Coumadin for a recent diagnosis of DVT.  Her issues in her right lower extremity began with an acute arterial clot requiring vascular surgery intervention last week.  This was followed by persistent pain and swelling in her right lower extremity which point she was diagnosed with a DVT.  She is been bridged from heparin on to Coumadin with therapeutic Coumadin at time of discharge.  She presents with ongoing pain and swelling.  She has received Percocet at home but not every 4 hours as needed as prescribed.  She reports moderate pain and swelling in her right lower extremity.   Past Medical History:  Diagnosis Date  . Abnormality of gait 03/28/2015  . Adrenal mass ([place])   . 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 Arthritis   . Back pain   . CHF (congestive heart failure) ([place])   . Chronic kidney disease    Hemo MWF  . Congestion of throat    Pt states she has a lot mucus in back throat.  . Constipation   . COPD (chronic obstructive pulmonary disease) ([place])   . Depression   . Dialysis patient [facility])    kidney  . Diverticulitis   . GERD (gastroesophageal reflux disease)   . H/O hiatal hernia   . High cholesterol   . Hoarseness of voice   . Hyperlipidemia   . Hypertension   . IBS (irritable bowel syndrome)   . Meralgia paresthetica of right side 12/26/2014  . Normal cardiac stress test 12/24/2009   lexiscan, imaging normal  . PAD (peripheral artery disease) ([place]) 12/21/2017  . Pneumonia    [id number]  . Renal disorder   . RLS (restless legs  syndrome)   . Seizures ([place])    2004 past brain surgery  . Sinus complaint   . Stroke ([place])    TIAs per patient 2 or 3  . Thyroid disease   . TIA (transient ischemic attack)   . Tubular adenoma of colon 01/2008    Patient Active Problem List   Diagnosis Date Noted  . Right leg pain 12/21/2017  . PAD (peripheral artery disease) ([place]) 12/21/2017  . Leukocytosis 12/21/2017  . Hyperlipidemia 12/01/2017  . Atherosclerosis of native arteries of extremity with rest pain ([place]) 12/01/2017  . LLQ abdominal pain 08/10/2017  . Left bundle branch block 07/24/2017  . Chest pain 07/24/2017  . Weakness 12/23/2016  . Generalized weakness 12/22/2016  . Elevated troponin 12/06/2016  . HCAP (healthcare-associated pneumonia) 12/06/2016  . Hypokalemia 12/06/2016  . Chronic systolic CHF (congestive heart failure) ([place]) 12/06/2016  . Osteopenia 09/08/2016  . Allergic rhinitis 08/07/2016  . BMI 32.0-32.9,adult 08/07/2016  . Onychomycosis 05/09/2016  . Nonspecific chest pain 04/19/2016  . ACS (acute coronary syndrome) ([place]) 04/19/2016  . Leg pain, bilateral 12/05/2015  . Trouble in sleeping 11/22/2015  . End stage renal disease ([place])   . Hyperkalemia 11/12/2015  . Acute on chronic respiratory failure with hypoxia ([place]) 11/12/2015  . ESRD on dialysis ([place])   .  Chronic obstructive pulmonary disease ([place])   . Hypertensive urgency 09/20/2015  . Fecal incontinence   . Adverse effects of medication 06/21/2015  . Herpes 06/19/2015  . Skin lesion 06/16/2015  . Recurrent genital herpes 06/16/2015  . COPD exacerbation ([place]) 06/05/2015  . Onychocryptosis 04/08/2015  . Unstable gait 03/28/2015  . Renal mass   . Hypertension   . Inadequate pain control 12/30/2014  . CHF (congestive heart failure) ([place]) 12/30/2014  . Meralgia paresthetica of right side 12/26/2014  . Hereditary and idiopathic peripheral neuropathy 02/15/2013  . Dermatitis of face 08/19/2012  . HIP PAIN, BILATERAL 12/07/2008  . BLADDER  PROLAPSE 11/17/2007  . OSTEOARTHRITIS, GENERALIZED, MULTIPLE JOINTS 08/18/2007  . 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 Anxiety and depression 04/23/2007  . ANEMIA NEC 03/31/2007  . TOBACCO ABUSE 12/10/2006  . HIATAL HERNIA 12/10/2006  . Dysphagia 12/10/2006  . HYPERCHOLESTEROLEMIA 07/30/2006  . Gout, unspecified 07/30/2006  . Major depression in partial remission ([place]) 07/30/2006  . Hypertensive renal disease, malignant, with renal failure 07/30/2006  . GASTROESOPHAGEAL REFLUX, NO ESOPHAGITIS 07/30/2006    Past Surgical History:  Procedure Laterality Date  . A/V SHUNT INTERVENTION N/A 08/13/2017   Procedure: A/V SHUNT INTERVENTION;  Surgeon: Algernon Huxley, MD;  Location: [place] [facility];  Service: Cardiovascular;  Laterality: N/A;  . A/V SHUNTOGRAM Left 08/13/2017   Procedure: A/V SHUNTOGRAM;  Surgeon: Algernon Huxley, MD;  Location: [place] [facility];  Service: Cardiovascular;  Laterality: Left;  . ABDOMINAL HYSTERECTOMY    . ANAL RECTAL MANOMETRY N/A 07/25/2015   Procedure: ANO RECTAL MANOMETRY;  Surgeon: Mauri Pole, MD;  Location: [facility];  Service: Endoscopy;  Laterality: N/A;  . APPENDECTOMY    . AV FISTULA PLACEMENT Left 11/11/2012   Procedure: INSERTION OF ARTERIOVENOUS (AV) GORE-TEX GRAFT ARM;  Surgeon: Angelia Mould, MD;  Location: [facility];  Service: Vascular;  Laterality: Left;  . [place] REMOVAL Left 11/18/2012   Procedure: REMOVAL OF LEFT UPPER ARM ARTERIOVENOUS GORETEX GRAFT ([place]);  Surgeon: Angelia Mould, MD;  Location: [employer];  Service: Vascular;  Laterality: Left;  . CARDIAC CATHETERIZATION  2003   normal  . CHOLECYSTECTOMY     Open mid line incision  . frontal craniotomy  2002   indication = sinusitis  . INSERTION OF DIALYSIS CATHETER     Left  . LOWER EXTREMITY ANGIOGRAPHY Right 12/17/2017   Procedure: LOWER EXTREMITY ANGIOGRAPHY;  Surgeon: Algernon Huxley, MD;  Location: [place] [facility];  Service: Cardiovascular;   Laterality: Right;  . NASAL SINUS SURGERY Bilateral 2002  . PATCH ANGIOPLASTY Left 11/18/2012   Procedure: PATCH ANGIOPLASTY;  Surgeon: Angelia Mould, MD;  Location: [employer];  Service: Vascular;  Laterality: Left;  . PERIPHERAL VASCULAR CATHETERIZATION Left 03/08/2015   Procedure: A/V Shuntogram/Fistulagram;  Surgeon: Algernon Huxley, MD;  Location: [place] [facility];  Service: Cardiovascular;  Laterality: Left;  . PERIPHERAL VASCULAR CATHETERIZATION Left 03/08/2015   Procedure: A/V Shunt Intervention;  Surgeon: Algernon Huxley, MD;  Location: [facility];  Service: Cardiovascular;  Laterality: Left;  . RECTAL ULTRASOUND N/A 10/05/2015   Procedure: ANAL ULTRASOUND WITH PROBE;  Surgeon: Leighton Ruff, MD;  [facility];  Service: Endoscopy;  Laterality: N/A;  . REVISON OF ARTERIOVENOUS FISTULA  05/11/2012   Procedure: REVISON OF ARTERIOVENOUS FISTULA;  Surgeon: Elam Dutch, MD;  Location: [facility];  Service: Vascular;  Laterality: Right;  . TUBAL LIGATION       OB  History    Gravida  5   Para  3   Term  3   Preterm      AB      Living        SAB      TAB      Ectopic      Multiple      Live Births               Home Medications    Prior to Admission medications   Medication Sig Start Date End Date Taking? Authorizing Provider  albuterol (PROVENTIL HFA;VENTOLIN HFA) 108 (90 Base) MCG/ACT inhaler Inhale 2 puffs into the lungs every 4 (four) hours as needed for wheezing or shortness of breath. 03/11/17   Martinique, Betty G, MD  allopurinol (ZYLOPRIM) 100 MG tablet Take 100 mg by mouth once daily on non-dialysis days (Tues/Thurs/Sat/Sun) Patient taking differently: Take 100 mg by mouth See admin instructions. Take one tablet (100 mg) by mouth once daily on non-dialysis days (Tues/Thurs/Sat/Sun) 11/13/15   Smiley Houseman, MD  amLODipine (NORVASC) 10 MG tablet TAKE 1 TABLET BY MOUTH AT BEDTIME Patient taking differently: TAKE 1 TABLET (10 MG) BY MOUTH  AT BEDTIME 06/01/17   Martinique, Betty G, MD  atorvastatin (LIPITOR) 40 MG tablet Take 1 tablet (40 mg total) by mouth daily. Patient taking differently: Take 40 mg by mouth at bedtime.  02/26/15   Rumley, Chelan N, DO  azelastine (ASTELIN) 0.1 % nasal spray Place 1 spray into both nostrils 2 (two) times daily. Use in each nostril as directed Patient taking differently: Place 1 spray into both nostrils 2 (two) times daily as needed. Use in each nostril as directed 08/07/16   Martinique, Betty G, MD  B Complex-C-Zn-Folic Acid (DIALYVITE/ZINC) TABS Take 1 tablet by mouth daily. 09/25/17   [provider]  budesonide-formoterol (SYMBICORT) 160-4.5 MCG/ACT inhaler Inhale 2 puffs into the lungs 2 (two) times daily. 07/26/17   Dana Allan I, MD  cinacalcet (SENSIPAR) 90 MG tablet Take 90 mg by mouth at bedtime.    [provider]  DULoxetine (CYMBALTA) 30 MG capsule Take 2 capsules (60 mg total) by mouth daily. Patient taking differently: Take 60 mg by mouth at bedtime.  01/08/17   Kathrynn Ducking, MD  fluticasone [facility]) 50 MCG/ACT nasal spray Place 2 sprays into both nostrils daily as needed for allergies or rhinitis. 10/29/17   Martinique, Betty G, MD  glycopyrrolate (ROBINUL) 2 MG tablet Take 1 tablet (2 mg total) by mouth 2 (two) times daily. 08/25/17   Ladene Artist, MD  hydrOXYzine (ATARAX/VISTARIL) 25 MG tablet Take 1 tablet (25 mg total) by mouth every 8 (eight) hours as needed for itching. 09/16/17   Martinique, Betty G, MD  lidocaine-prilocaine (EMLA) cream Apply 1 application topically See admin instructions. Apply topically one hour before dialysis - Monday, Wednesday, Friday    [provider]  loratadine (CLARITIN) 10 MG tablet 10 mg every other day. Patient taking differently: Take 10 mg by mouth daily as needed for allergies. 10 mg every other day. 10/29/17   Martinique, Betty G, MD  losartan (COZAAR) 100 MG tablet Take 1 tablet (100 mg total) by mouth daily. 10/29/17   Martinique,  Betty G, MD  metoprolol succinate (TOPROL-XL) 100 MG 24 hr tablet TAKE 1 TABLET (100 MG)  BY MOUTH TWICE DAILY 10/13/17   Martinique, Betty G, MD  multivitamin (RENA-VIT) TABS tablet Take 1 tablet by mouth at bedtime. Patient taking differently:  Take 1 tablet by mouth daily.  01/01/15   Haney, Amedeo Plenty, MD  oxyCODONE-acetaminophen (PERCOCET) 5-325 MG tablet Take 1 tablet by mouth every 4 (four) hours as needed for up to 7 days for severe pain. 12/27/17 01/03/18  Georgette Shell, MD  polyethylene glycol powder [facility]) powder Take 17 g by mouth daily. 12/29/17   Jola Schmidt, MD  pregabalin (LYRICA) 100 MG capsule Take 100 mg by mouth at bedtime.     [provider]  senna-docusate (SENOKOT-S) 8.6-50 MG tablet Take 1 tablet by mouth 2 (two) times daily. 12/27/17   Georgette Shell, MD  sevelamer carbonate (RENVELA) 800 MG tablet Take 2,400 mg by mouth See admin instructions. Take 3 tablets (2400 mg) by mouth with each meal or snack - once or twice daily    [provider]  warfarin (COUMADIN) 5 MG tablet Take 5 mg Monday Wednesday Friday and 2.5 mg all other days.check inr 7/29. 12/27/17   Georgette Shell, MD    Family History Family History  Problem Relation Age of Onset  . Thyroid cancer Mother   . Heart disease Father   . Hypertension Father   . Heart attack Father   . Lupus Daughter   . Breast cancer Sister   . Thyroid cancer Sister   . Kidney disease Sister   . Colon cancer Neg Hx     Social History Social History   Tobacco Use  . Smoking status: Current Every Day Smoker    Packs/day: 1.00    Years: 60.00    Pack years: 60.00    Types: Cigarettes  . Smokeless tobacco: Never Used  . Tobacco comment: smoker since 77 yo.  1.5 ppd of [place] 100 lights. ; form given 10-23-16  Substance Use Topics  . Alcohol use: No    Alcohol/week: 0.0 oz  . Drug use: No    Comment: No hx of IV drug use     Allergies   Tuberculin tests; Valacyclovir; Codeine;  Penicillins; and Sulfamethoxazole   Review of Systems Review of Systems  All other systems reviewed and are negative.    Physical Exam Updated Vital Signs BP (!) 165/55   Pulse 72   Temp 98.4 F (36.9 C) (Oral)   Resp 16   SpO2 98%   Physical Exam  Constitutional: She is oriented to person, place, and time. She appears well-developed and well-nourished.  HENT:  Head: Normocephalic.  Eyes: EOM are normal.  Neck: Normal range of motion.  Pulmonary/Chest: Effort normal.  Abdominal: She exhibits no distension.  Musculoskeletal: Normal range of motion.  Mild swelling of the right lower extremity as compared to left.  There is no erythema or warmth of the right lower extremity.  No tenderness at the right groin.  No significant medial thigh tenderness or significant thigh swelling.  The majority the swelling is in the dorsum of the foot and the ankle and lower third of the tibia.  Strong dopplerable DP and PT signals in the right lower extremity  Neurological: She is alert and oriented to person, place, and time.  Psychiatric: She has a normal mood and affect.  Nursing note and vitals reviewed.    ED Treatments / Results  Labs (all labs ordered are listed, but only abnormal results are displayed) Labs Reviewed  CBC WITH DIFFERENTIAL/PLATELET - Abnormal; Notable for the following components:      Result Value   WBC 11.2 (*)    Hemoglobin 11.4 (*)  RDW 17.0 (*)    Neutro Abs 7.8 (*)    Monocytes Absolute 1.5 (*)    Abs Immature Granulocytes 0.2 (*)    All other components within normal limits  COMPREHENSIVE METABOLIC PANEL - Abnormal; Notable for the following components:   Creatinine, Ser 6.66 (*)    Calcium 8.7 (*)    Albumin 2.6 (*)    GFR calc non Af Amer 5 (*)    GFR calc Af Amer 6 (*)    All other components within normal limits  PROTIME-INR - Abnormal; Notable for the following components:   Prothrombin Time 36.2 (*)    All other components within normal  limits  APTT - Abnormal; Notable for the following components:   aPTT 88 (*)    All other components within normal limits  I-STAT CG4 LACTIC ACID, ED    EKG None  Radiology No results found.  Procedures Procedures (including critical care time)  Medications Ordered in ED Medications  oxyCODONE-acetaminophen (PERCOCET/ROXICET) 5-325 MG per tablet 1 tablet (has no administration in time range)  oxyCODONE-acetaminophen (PERCOCET/ROXICET) 5-325 MG per tablet 0.5 tablet (0.5 tablets Oral Given 12/29/17 1604)     Initial Impression / Assessment and Plan / ED Course  I have reviewed the triage vital signs and the nursing notes.  Pertinent labs & imaging results that were available during my care of the patient were reviewed by me and considered in my medical decision making (see chart for details).     No signs to suggest cellulitis.  Doubt deep space infection.  Venous duplex performed which demonstrates no evidence of DVT in the right lower extremity.  Normal arterial flow into the right lower extremity with DP and PT signals that are strong.  Pain treated.  She will need MiraLAX to avoid constipation.  His primary care and vascular surgery follow-up.  Likely mild lymphedema/venous insufficiency.  She is been instructed to elevate her leg.  She will need compression stockings when cleared by vascular surgery.  Final Clinical Impressions(s) / ED Diagnoses   Final diagnoses:  Acute pain of right lower extremity    ED Discharge Orders        Ordered    polyethylene glycol powder (MIRALAX) powder  Daily     12/29/17 1748       Jola Schmidt, MD 12/29/17 1753

## 2017-12-29 NOTE — Telephone Encounter (Signed)
Pt will call back after breakfast this morning.

## 2017-12-29 NOTE — Telephone Encounter (Signed)
I confirmed patient has arrived in the ER now.  

## 2017-12-30 ENCOUNTER — Telehealth: Payer: Self-pay | Admitting: Family Medicine

## 2017-12-30 DIAGNOSIS — N186 End stage renal disease: Secondary | ICD-10-CM | POA: Diagnosis not present

## 2017-12-30 DIAGNOSIS — D631 Anemia in chronic kidney disease: Secondary | ICD-10-CM | POA: Diagnosis not present

## 2017-12-30 DIAGNOSIS — I70211 Atherosclerosis of native arteries of extremities with intermittent claudication, right leg: Secondary | ICD-10-CM | POA: Diagnosis not present

## 2017-12-30 DIAGNOSIS — I82401 Acute embolism and thrombosis of unspecified deep veins of right lower extremity: Secondary | ICD-10-CM | POA: Diagnosis not present

## 2017-12-30 DIAGNOSIS — D689 Coagulation defect, unspecified: Secondary | ICD-10-CM | POA: Diagnosis not present

## 2017-12-30 DIAGNOSIS — N2581 Secondary hyperparathyroidism of renal origin: Secondary | ICD-10-CM | POA: Diagnosis not present

## 2017-12-30 DIAGNOSIS — Z992 Dependence on renal dialysis: Secondary | ICD-10-CM | POA: Diagnosis not present

## 2017-12-30 NOTE — Telephone Encounter (Signed)
Message sent to Dr. Jordan for review. 

## 2017-12-30 NOTE — Telephone Encounter (Signed)
If INR is 7 she needs to go to the ER to have it re-check.  Thanks, BJ

## 2017-12-30 NOTE — Telephone Encounter (Signed)
Tried calling number for [facility] nurse, stated number was incorrect or no longer in service. Spoke to daughter and she stated that the [facility] nurse took it twice and it was 7. They took it at dialysis and stated that it would take 24 hours to get results. I asked daughter once she received results to call the office and ask for Sunday Spillers and give her the results.

## 2017-12-30 NOTE — Telephone Encounter (Signed)
Copied from [place] [id number]. Topic: General - Other >> Dec 30, 2017 10:15 AM Carolyn Stare wrote: [name] with [name] called in pt INR 7 and it is out of range. Yesterday at the ER it was 3.6   Pt is on the way to dialysis and the nurse said she will call and have them check it    [phone]

## 2017-12-31 ENCOUNTER — Telehealth: Payer: Self-pay | Admitting: Family Medicine

## 2017-12-31 ENCOUNTER — Encounter: Payer: Self-pay | Admitting: *Deleted

## 2017-12-31 DIAGNOSIS — E1129 Type 2 diabetes mellitus with other diabetic kidney complication: Secondary | ICD-10-CM | POA: Diagnosis not present

## 2017-12-31 DIAGNOSIS — N186 End stage renal disease: Secondary | ICD-10-CM | POA: Diagnosis not present

## 2017-12-31 DIAGNOSIS — Z992 Dependence on renal dialysis: Secondary | ICD-10-CM | POA: Diagnosis not present

## 2017-12-31 NOTE — Telephone Encounter (Signed)
Villa Herb from our coumadin clinic spoke with [name] today. There is another phone message with her documentation and advice for the coumadin. Please see that note and will forward to Dr. Martinique for review.

## 2017-12-31 NOTE — Telephone Encounter (Signed)
Spoke with pt's daughter [name] who states that the patient has taken a half a tab of coumadin this morning and is looking for further instruction on what to do for tomorrow's dosage.Pt's daughter states that INR was taken on yesterday during dialysis appointment but has not been able to get results at this time due to the system being down.Pt's daughter states that she is in school and sometimes her cell phone is out a range but it is ok to leave a message and she will call back as soon as she can. Pt's daughter [name] can be reached at [phone].  Pt's daughter also stating that the pt has no had a BM in 6-7 days. Pt's stomach feels tight and pt does not want to eat, stating that the food taste sour in her mouth. Since being discharged from the hospital the pt has been been taking senokot twice a day and also Miralax which has not yielded any results at this time. Pt's daughter states that 2 years ago pt was in the hospital for impacted bowels she was given a prescription for Lactulose which helped the pt immediately. Pt's daughter wanting to know if the pt could be prescribed this medication or other recommendations for the pt to have a bowel movement.

## 2017-12-31 NOTE — Telephone Encounter (Signed)
I spoke with [name], patient's daughter today.  Patient chose not to go to the ER yesterday to have her INR re-checked.  Daughter gave patient 2.5 mg of coumadin yesterday and today, 8/1.  I asked daughter to call dialysis center tomorrow morning (Friday) and check to see if they have received Wednesdays labs. I informed daughter that I will send Dr. Martinique a request to send a order to the dialysis center asking then to fax patient's INR to the office on a weekly basis and she will be called with dosing instructions.  Daughter verbalized understanding.  Patient's daughter was instructed to take patient to ER if any unusual bleeding occurs.

## 2017-12-31 NOTE — Telephone Encounter (Unsigned)
Copied from [place] [id number]. Topic: General - Other >> Dec 30, 2017 10:15 AM Carolyn Stare wrote: [name] with [facility] called in pt INR 7 and it is out of range. Yesterday at the ER it was 3.6   Pt is on the way to dialysis and the nurse said she will call and have them check it    [phone]

## 2017-12-31 NOTE — Telephone Encounter (Signed)
This encounter was created in error - please disregard.

## 2017-12-31 NOTE — Telephone Encounter (Signed)
Can you take a look at this please? Dr. Martinique is out of the office today.

## 2018-01-01 ENCOUNTER — Other Ambulatory Visit: Payer: Self-pay

## 2018-01-01 DIAGNOSIS — D631 Anemia in chronic kidney disease: Secondary | ICD-10-CM | POA: Diagnosis not present

## 2018-01-01 DIAGNOSIS — D689 Coagulation defect, unspecified: Secondary | ICD-10-CM | POA: Diagnosis not present

## 2018-01-01 DIAGNOSIS — Z992 Dependence on renal dialysis: Secondary | ICD-10-CM | POA: Diagnosis not present

## 2018-01-01 DIAGNOSIS — N2581 Secondary hyperparathyroidism of renal origin: Secondary | ICD-10-CM | POA: Diagnosis not present

## 2018-01-01 DIAGNOSIS — N186 End stage renal disease: Secondary | ICD-10-CM | POA: Diagnosis not present

## 2018-01-01 LAB — POCT INR: INR: 5.2 — AB (ref 2.0–3.0)

## 2018-01-01 NOTE — Telephone Encounter (Signed)
Since I have not seen patient recently and  given her complaints medical history, I do not feel comfortable prescribing medications without an appropriate evaluation. Continue adequate fiber and fluid intake. Continue OTC MiraLAX. We will discuss other treatment options during follow-up appointment. If severe abdominal pain, nausea, vomiting, not able to pass stool, or MS changes she needs to go back to the ER.  Thanks, BJ

## 2018-01-01 NOTE — Telephone Encounter (Signed)
Message sent Dr. Martinique for review and recommendations.

## 2018-01-01 NOTE — Telephone Encounter (Signed)
Darlene the patient daughter  Is calling in to give  the results for patient  INR: 4.46   Reference arrange 0.90 / 1.10. She requesting a nurse give her a call back in regards to what she will need to  Do. CB: # [phone]

## 2018-01-01 NOTE — Telephone Encounter (Signed)
Message sent to Dr.Jordan. 

## 2018-01-01 NOTE — Telephone Encounter (Signed)
It seems like she is taking Coumadin 5 mg from Monday through Friday and 2.5 mg the rest of the days. Recommend holding Coumadin today and tomorrow. Sunday she will start with 5 mg to take Sunday, Monday, and Tuesday.  Take Coumadin 2.5 mg Wednesday, Thursday, Friday, and Saturday.  INR is 7 days.  She needs to have INR checked here in the office.  Thanks, BJ

## 2018-01-01 NOTE — Telephone Encounter (Signed)
Spoke with [name], gave instructions. Verbalized understanding.

## 2018-01-01 NOTE — Telephone Encounter (Signed)
Spoke with Darlene, instructions given, verbalized understanding. Appointment scheduled.

## 2018-01-04 ENCOUNTER — Ambulatory Visit (INDEPENDENT_AMBULATORY_CARE_PROVIDER_SITE_OTHER): Payer: Medicare Other | Admitting: General Practice

## 2018-01-04 ENCOUNTER — Telehealth: Payer: Self-pay | Admitting: Family Medicine

## 2018-01-04 DIAGNOSIS — N186 End stage renal disease: Secondary | ICD-10-CM | POA: Diagnosis not present

## 2018-01-04 DIAGNOSIS — Z7901 Long term (current) use of anticoagulants: Secondary | ICD-10-CM

## 2018-01-04 DIAGNOSIS — N2581 Secondary hyperparathyroidism of renal origin: Secondary | ICD-10-CM | POA: Diagnosis not present

## 2018-01-04 DIAGNOSIS — Z992 Dependence on renal dialysis: Secondary | ICD-10-CM | POA: Diagnosis not present

## 2018-01-04 DIAGNOSIS — D631 Anemia in chronic kidney disease: Secondary | ICD-10-CM | POA: Diagnosis not present

## 2018-01-04 NOTE — Telephone Encounter (Signed)
Noted.  Patient's daughter has been called.  See anticoagulation note for 8/5.

## 2018-01-04 NOTE — Telephone Encounter (Unsigned)
Copied from [place] [id number]. Topic: Quick Communication - See Telephone Encounter >> Jan 04, 2018 12:45 PM Percell Belt A wrote: CRM for notification. See Telephone encounter for: 01/04/18.  Pt daughter called and stated that [facility] check pt INR Friday 8/2 and it was 5.23 and would like to know what Dr Martinique suggest?    Best number [phone]- Daughter

## 2018-01-04 NOTE — Patient Instructions (Addendum)
Pre visit review using our clinic review tool, if applicable. No additional management support is needed unless otherwise documented below in the visit note.  Patient has been taking 2.5 mg daily except 5 mg on Mond/Wed/Friday.    Patient was 5.2 at Dialysis on Friday and that was reported to PCP today, 8/5.  Coumadin, per Dr. Martinique was held Friday and Saturday (8/2 and 8/3) and patient took 5 mg on Sunday per daughter.  Patient will be in the office Tuesday, 8/6 and will have INR checked then. Jacquelynn Cree will forward results to [place], RN.

## 2018-01-05 ENCOUNTER — Telehealth: Payer: Self-pay | Admitting: Family Medicine

## 2018-01-05 ENCOUNTER — Ambulatory Visit (INDEPENDENT_AMBULATORY_CARE_PROVIDER_SITE_OTHER): Payer: Medicare Other | Admitting: Family Medicine

## 2018-01-05 ENCOUNTER — Ambulatory Visit (INDEPENDENT_AMBULATORY_CARE_PROVIDER_SITE_OTHER): Payer: Medicare Other | Admitting: General Practice

## 2018-01-05 ENCOUNTER — Telehealth (INDEPENDENT_AMBULATORY_CARE_PROVIDER_SITE_OTHER): Payer: Self-pay

## 2018-01-05 ENCOUNTER — Encounter: Payer: Self-pay | Admitting: Family Medicine

## 2018-01-05 VITALS — BP 108/44 | HR 73 | Temp 98.3°F | Resp 20

## 2018-01-05 DIAGNOSIS — Z7901 Long term (current) use of anticoagulants: Secondary | ICD-10-CM

## 2018-01-05 DIAGNOSIS — N186 End stage renal disease: Secondary | ICD-10-CM | POA: Diagnosis not present

## 2018-01-05 DIAGNOSIS — I739 Peripheral vascular disease, unspecified: Secondary | ICD-10-CM

## 2018-01-05 DIAGNOSIS — D638 Anemia in other chronic diseases classified elsewhere: Secondary | ICD-10-CM

## 2018-01-05 DIAGNOSIS — I129 Hypertensive chronic kidney disease with stage 1 through stage 4 chronic kidney disease, or unspecified chronic kidney disease: Secondary | ICD-10-CM | POA: Diagnosis not present

## 2018-01-05 DIAGNOSIS — I5022 Chronic systolic (congestive) heart failure: Secondary | ICD-10-CM

## 2018-01-05 DIAGNOSIS — R2681 Unsteadiness on feet: Secondary | ICD-10-CM

## 2018-01-05 DIAGNOSIS — Z992 Dependence on renal dialysis: Secondary | ICD-10-CM

## 2018-01-05 LAB — BASIC METABOLIC PANEL
BUN: 15 mg/dL (ref 6–23)
CO2: 34 mEq/L — ABNORMAL HIGH (ref 19–32)
CREATININE: 5.42 mg/dL — AB (ref 0.40–1.20)
Calcium: 8.7 mg/dL (ref 8.4–10.5)
Chloride: 94 mEq/L — ABNORMAL LOW (ref 96–112)
GFR: 9.86 mL/min — CL (ref >60.00)
Glucose, Bld: 59 mg/dL — ABNORMAL LOW (ref 70–99)
Potassium: 3.8 mEq/L (ref 3.5–5.1)
Sodium: 140 mEq/L (ref 135–145)

## 2018-01-05 LAB — CBC
HEMATOCRIT: 36.2 % (ref 36.0–46.0)
Hemoglobin: 12 g/dL (ref 12.0–15.0)
MCHC: 33.1 g/dL (ref 30.0–36.0)
MCV: 88.7 fl (ref 78.0–100.0)
Platelets: 272 10*3/uL (ref 150.0–400.0)
RBC: 4.08 Mil/uL (ref 3.87–5.11)
RDW: 18 % — AB (ref 11.5–15.5)
WBC: 7.2 10*3/uL (ref 4.0–10.5)

## 2018-01-05 LAB — POCT INR: INR: 5.3 — AB (ref 2.0–3.0)

## 2018-01-05 MED ORDER — OXYCODONE-ACETAMINOPHEN 5-325 MG PO TABS: 1.0000 | ORAL_TABLET | Freq: Three times a day (TID) | ORAL | 0 refills | Status: AC | PRN

## 2018-01-05 NOTE — Assessment & Plan Note (Signed)
Currently she is asymptomatic. Continue losartan and metoprolol. Continue low-salt diet.

## 2018-01-05 NOTE — Assessment & Plan Note (Signed)
She has an appointment on 02/18/2018. Today peripheral pulses are present. She is still smoking and not interested in smoking cessation for now. She is on Lipitor 40 mg. She will continue pain management with Percocet 5-325 mg 3 times daily as needed.  We discussed side effects in detail, including MS changes, constipation, and falls among some.  Her daughter will administer medication.  Plavix and Aspirin were discontinued, Coumadin was held because elevated INR. She will continue following with Dr. Lucky Cowboy

## 2018-01-05 NOTE — Assessment & Plan Note (Addendum)
INR today 5.3. Currently she is not on Coumadin. Planning anticoagulation for 3 months. We discussed some side effects of medication. She will continue following with Coumadin clinic. Instructed about warning signs.

## 2018-01-05 NOTE — Progress Notes (Signed)
I have reviewed documentation from this visit and I agree with recommendations given.  Endre Coutts G. Jowanna Loeffler, MD  [facility]. [place] office.   

## 2018-01-05 NOTE — Assessment & Plan Note (Signed)
Aggravated by several of her medical problems: OA, neuropathy, PAD, and medication among some. Fall precautions discussed. She would like a prescription for a motorized wheelchair,Rx given. At this time I will obtain she will benefit from PT.

## 2018-01-05 NOTE — Progress Notes (Signed)
HPI:   [name] is a 77 y.o. female, who is here today with her daughter to follow on recent hospitalization.   She was hospitalized from 12/21/2017 to 12/27/2017. Status post right superficial femoral artery and proximal popliteal artery on 12/17/2017. ABI right leg 0.37 and left leg 0.7 before procedure.  She presented to the ER complaining of 5 days of severe right lower extremity pain.  Bilateral venous study was done on 12/22/2017, negative for DVT. Vascular evaluation during hospitalization by Dr. Donnetta Hutching on 12/23/2017, who recommended standard treatment for DVT, he disagreed with duplex study.  INR at the time of discharge was 2.5.  Coumadin has been held because of supratherapeutic INR. She is not longer on Plavix .  Mild metabolic encephalopathy, she missed dialysis for 5 days prior to hospitalization. Her daughter has not noted MS changes. She is producing a small amount of urine.   CHF: Echo in 07/24/2017 show LVEF 55 to 60% and grade 2 diastolic dysfunction. Negative for orthopnea or PND. Hypertension: She is reporting episodes of low blood pressure, 90s/40s, last week during dialysis.  She states that she almost passed out and required IV fluids before and after dialysis. Yesterday BP was 168/80. She is not checking BP at home. A few months ago he started weaning metoprolol succinate due to hypotension, currently she is back to metoprolol succinate 200 mg daily. Clonidine was discontinued during hospitalization. She is also on amlodipine 10 mg and Cozaar 100 mg daily.  Anemia of chronic disease: She has not noted gross hematuria, gum or nose bleedi she still smoking ng, or blood in the stool.   Lab Results  Component Value Date   WBC 11.2 (H) 12/29/2017   HGB 11.4 (L) 12/29/2017   HCT 37.3 12/29/2017   MCV 92.8 12/29/2017   PLT 229 12/29/2017   Lab Results  Component Value Date   CREATININE 6.66 (H) 12/29/2017   BUN 14 12/29/2017   NA 140  12/29/2017   K 3.8 12/29/2017   CL 99 12/29/2017   CO2 29 12/29/2017   Constipation: Problem has improved, she has had daily bowel movements for the past 4 days. Last bowel movement today. She is currently on MiraLAX and senna daily as needed. She denies abdominal pain, nausea, or vomiting.    For pain management, she was discharged on Percocet 5-325 mg every 4-6 hours.  According to [name], her daughter has been giving her Percocet every 12-18 hours, she is afraid of her mother "becoming addicted." Lower extremity pain, bilateral but right worse than left, seems to be worse at the end of the day. Throbbing, constant right lower extremity pain, 8/10.  In the morning pain is about 5/10.  Severe burning pain left lower extremity, history of peripheral neuropathy. She is currently on Lyrica 100 mg daily. In general she states that the pain is "a little better" but still severe.  This morning she was here with her niece, she was ambulatory using a cane, she was complaining of being cold but she was not in acute distress and able to provide history for her niece, who has mental retardation. Now she is in a wheel chair,moaning and shaking at times.   She is considering stopping dialysis, she is planning on having a discussion with her nephrologist. She states that she is tired of having dialysis and being a burden for her family. She denies suicidal thoughts. Currently she is on Cymbalta 30 mg daily.  She  is having a through home health, 3.5 hours from Monday through Sunday. She needs help with ADLs and IADLs. She is having PT once per week, today it was canceled because she had appointment.   Review of Systems  Constitutional: Positive for fatigue. Negative for activity change, appetite change and fever.  HENT: Negative for mouth sores, nosebleeds and trouble swallowing.   Eyes: Negative for redness and visual disturbance.  Respiratory: Negative for cough, shortness of breath and  wheezing.   Cardiovascular: Negative for chest pain, palpitations and leg swelling.  Gastrointestinal: Negative for abdominal pain, nausea and vomiting.       Negative for changes in bowel habits.  Endocrine: Positive for cold intolerance. Negative for polydipsia, polyphagia and polyuria.  Genitourinary: Negative for decreased urine volume, dysuria and hematuria.  Musculoskeletal: Positive for gait problem and myalgias.  Skin: Negative for rash and wound.  Allergic/Immunologic: Positive for environmental allergies.  Neurological: Negative for syncope, weakness and headaches.  Psychiatric/Behavioral: Positive for sleep disturbance. Negative for confusion, hallucinations and suicidal ideas. The patient is nervous/anxious.       Current Outpatient Medications on File Prior to Visit  Medication Sig Dispense Refill  . albuterol (PROVENTIL HFA;VENTOLIN HFA) 108 (90 Base) MCG/ACT inhaler Inhale 2 puffs into the lungs every 4 (four) hours as needed for wheezing or shortness of breath. 1 Inhaler 1  . allopurinol (ZYLOPRIM) 100 MG tablet Take 100 mg by mouth once daily on non-dialysis days (Tues/Thurs/Sat/Sun) (Patient taking differently: Take 100 mg by mouth See admin instructions. Take one tablet (100 mg) by mouth once daily on non-dialysis days (Tues/Thurs/Sat/Sun)) 30 tablet 0  . amLODipine (NORVASC) 10 MG tablet TAKE 1 TABLET BY MOUTH AT BEDTIME (Patient taking differently: TAKE 1 TABLET (10 MG) BY MOUTH AT BEDTIME) 90 tablet 1  . atorvastatin (LIPITOR) 40 MG tablet Take 1 tablet (40 mg total) by mouth daily. (Patient taking differently: Take 40 mg by mouth at bedtime. ) 90 tablet 3  . azelastine (ASTELIN) 0.1 % nasal spray Place 1 spray into both nostrils 2 (two) times daily. Use in each nostril as directed (Patient taking differently: Place 1 spray into both nostrils 2 (two) times daily as needed. Use in each nostril as directed) 30 mL 6  . budesonide-formoterol (SYMBICORT) 160-4.5 MCG/ACT inhaler  Inhale 2 puffs into the lungs 2 (two) times daily. 1 Inhaler 12  . cinacalcet (SENSIPAR) 90 MG tablet Take 90 mg by mouth at bedtime.    . DULoxetine (CYMBALTA) 30 MG capsule Take 2 capsules (60 mg total) by mouth daily. (Patient taking differently: Take 60 mg by mouth at bedtime. ) 180 capsule 3  . fluticasone (FLONASE) 50 MCG/ACT nasal spray Place 2 sprays into both nostrils daily as needed for allergies or rhinitis. 16 g 3  . glycopyrrolate (ROBINUL) 2 MG tablet Take 1 tablet (2 mg total) by mouth 2 (two) times daily. 60 tablet 11  . hydrOXYzine (ATARAX/VISTARIL) 25 MG tablet Take 1 tablet (25 mg total) by mouth every 8 (eight) hours as needed for itching. 30 tablet 1  . lidocaine-prilocaine (EMLA) cream Apply 1 application topically See admin instructions. Apply topically one hour before dialysis - Monday, Wednesday, Friday    . loratadine (CLARITIN) 10 MG tablet 10 mg every other day. (Patient taking differently: Take 10 mg by mouth daily as needed for allergies. 10 mg every other day.) 45 tablet 1  . losartan (COZAAR) 100 MG tablet Take 1 tablet (100 mg total) by mouth daily. 90 tablet 1  .  metoprolol succinate (TOPROL-XL) 100 MG 24 hr tablet TAKE 1 TABLET (100 MG)  BY MOUTH TWICE DAILY 180 tablet 3  . multivitamin (RENA-VIT) TABS tablet Take 1 tablet by mouth at bedtime. (Patient taking differently: Take 1 tablet by mouth daily. ) 60 tablet 5  . polyethylene glycol powder (MIRALAX) powder Take 17 g by mouth daily. 255 g 0  . pregabalin (LYRICA) 100 MG capsule Take 100 mg by mouth at bedtime.     . senna-docusate (SENOKOT-S) 8.6-50 MG tablet Take 1 tablet by mouth 2 (two) times daily.    . sevelamer carbonate (RENVELA) 800 MG tablet Take 2,400 mg by mouth See admin instructions. Take 3 tablets (2400 mg) by mouth with each meal or snack - once or twice daily    . warfarin (COUMADIN) 5 MG tablet Take 5 mg Monday Wednesday Friday and 2.5 mg all other days.check inr 7/29. 30 tablet 1  . B  Complex-C-Zn-Folic Acid (DIALYVITE/ZINC) TABS Take 1 tablet by mouth daily.  6   No current facility-administered medications on file prior to visit.      Past Medical History:  Diagnosis Date  . Abnormality of gait 03/28/2015  . Adrenal mass ([place])   . 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 Arthritis   . Back pain   . CHF (congestive heart failure) ([place])   . Chronic kidney disease    Hemo MWF  . Congestion of throat    Pt states she has a lot mucus in back throat.  . Constipation   . COPD (chronic obstructive pulmonary disease) ([place])   . Depression   . Dialysis patient [facility])    kidney  . Diverticulitis   . GERD (gastroesophageal reflux disease)   . H/O hiatal hernia   . High cholesterol   . Hoarseness of voice   . Hyperlipidemia   . Hypertension   . IBS (irritable bowel syndrome)   . Meralgia paresthetica of right side 12/26/2014  . Normal cardiac stress test 12/24/2009   lexiscan, imaging normal  . PAD (peripheral artery disease) ([place]) 12/21/2017  . Pneumonia    [id number]  . Renal disorder   . RLS (restless legs syndrome)   . Seizures ([place])    2004 past brain surgery  . Sinus complaint   . Stroke ([place])    TIAs per patient 2 or 3  . Thyroid disease   . TIA (transient ischemic attack)   . Tubular adenoma of colon 01/2008   Allergies  Allergen Reactions  . Tuberculin Tests Hives    "blisters"  . Valacyclovir Other (See Comments)    Confusion and nervousness  . Codeine Nausea And Vomiting  . Penicillins Rash    No problems breathing. Has tolerated omnicef in past without issue Has patient had a PCN reaction causing immediate rash, facial/tongue/throat swelling, SOB or lightheadedness with hypotension: Yes Has patient had a PCN reaction causing severe rash involving mucus membranes or skin necrosis: No Has patient had a PCN reaction that required hospitalization No Has patient had a PCN reaction occurring within the last 10  years: No If all of the above answers are "NO", then may proceed with Cephalosporin  . Sulfamethoxazole Rash   Past Surgical History:  Procedure Laterality Date  . A/V SHUNT INTERVENTION N/A 08/13/2017   Procedure: A/V SHUNT INTERVENTION;  Surgeon: Algernon Huxley, MD;  Location: [place] [facility];  Service: Cardiovascular;  Laterality: N/A;  . A/V  SHUNTOGRAM Left 08/13/2017   Procedure: A/V SHUNTOGRAM;  Surgeon: Algernon Huxley, MD;  Location: [place] [facility];  Service: Cardiovascular;  Laterality: Left;  . ABDOMINAL HYSTERECTOMY    . ANAL RECTAL MANOMETRY N/A 07/25/2015   Procedure: ANO RECTAL MANOMETRY;  Surgeon: Mauri Pole, MD;  Location: [facility];  Service: Endoscopy;  Laterality: N/A;  . APPENDECTOMY    . AV FISTULA PLACEMENT Left 11/11/2012   Procedure: INSERTION OF ARTERIOVENOUS (AV) GORE-TEX GRAFT ARM;  Surgeon: Angelia Mould, MD;  Location: [place];  Service: Vascular;  Laterality: Left;  . [place] REMOVAL Left 11/18/2012   Procedure: REMOVAL OF LEFT UPPER ARM ARTERIOVENOUS GORETEX GRAFT ([place]);  Surgeon: Angelia Mould, MD;  Location: [place];  Service: Vascular;  Laterality: Left;  . CARDIAC CATHETERIZATION  2003   normal  . CHOLECYSTECTOMY     Open mid line incision  . frontal craniotomy  2002   indication = sinusitis  . INSERTION OF DIALYSIS CATHETER     Left  . LOWER EXTREMITY ANGIOGRAPHY Right 12/17/2017   Procedure: LOWER EXTREMITY ANGIOGRAPHY;  Surgeon: Algernon Huxley, MD;  Location: [place] [facility];  Service: Cardiovascular;  Laterality: Right;  . NASAL SINUS SURGERY Bilateral 2002  . PATCH ANGIOPLASTY Left 11/18/2012   Procedure: PATCH ANGIOPLASTY;  Surgeon: Angelia Mould, MD;  Location: [place];  Service: Vascular;  Laterality: Left;  . PERIPHERAL VASCULAR CATHETERIZATION Left 03/08/2015   Procedure: A/V Shuntogram/Fistulagram;  Surgeon: Algernon Huxley, MD;  Location: [place] [facility];  Service: Cardiovascular;  Laterality: Left;  .  PERIPHERAL VASCULAR CATHETERIZATION Left 03/08/2015   Procedure: A/V Shunt Intervention;  Surgeon: Algernon Huxley, MD;  Location: [place] [facility];  Service: Cardiovascular;  Laterality: Left;  . RECTAL ULTRASOUND N/A 10/05/2015   Procedure: ANAL ULTRASOUND WITH PROBE;  Surgeon: Leighton Ruff, MD;  [facility];  Service: Endoscopy;  Laterality: N/A;  . REVISON OF ARTERIOVENOUS FISTULA  05/11/2012   Procedure: REVISON OF ARTERIOVENOUS FISTULA;  Surgeon: Elam Dutch, MD;  Location: [place];  Service: Vascular;  Laterality: Right;  . TUBAL LIGATION       Social History   Socioeconomic History  . Marital status: Legally Separated    Spouse name: Not on file  . Number of children: 3  . Years of education: 78  . Highest education level: Not on file  Occupational History  . Occupation: retired  Scientific laboratory technician  . Financial resource strain: Not on file  . Food insecurity:    Worry: Not on file    Inability: Not on file  . Transportation needs:    Medical: Not on file    Non-medical: Not on file  Tobacco Use  . Smoking status: Current Every Day Smoker    Packs/day: 1.00    Years: 60.00    Pack years: 60.00    Types: Cigarettes  . Smokeless tobacco: Never Used  . Tobacco comment: smoker since 77 yo.  1.5 ppd of [place] 100 lights. ; form given 10-23-16  Substance and Sexual Activity  . Alcohol use: No    Alcohol/week: 0.0 oz  . Drug use: No    Comment: No hx of IV drug use  . Sexual activity: Not Currently    Birth control/protection: None  Lifestyle  . Physical activity:    Days per week: Not on file    Minutes per session: Not on file  . Stress: Not on file  Relationships  . Social  connections:    Talks on phone: Not on file    Gets together: Not on file    Attends religious service: Not on file    Active member of club or organization: Not on file    Attends meetings of clubs or organizations: Not on file    Relationship status: Not on file  Other Topics Concern   . Not on file  Social History Narrative   ** Merged History Encounter **       Lives in [place] with Daughter, granddaughters (twins) and mentally challenged niece.   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   Patient has a college education.   Patient drinks one cup of caffeine daily.             Vitals:   01/05/18 1528  BP: (!) 108/44  Pulse: 73  Resp: 20  Temp: 98.3 F (36.8 C)  SpO2: 90%   There is no height or weight on file to calculate BMI.   Physical Exam  Nursing note and vitals reviewed. Constitutional: She is oriented to person, place, and time. She appears well-developed. She appears distressed (Due to pain and intermitently).  HENT:  Head: Normocephalic and atraumatic.  Mouth/Throat: Oropharynx is clear and moist and mucous membranes are normal. Abnormal dentition.  Eyes: Pupils are equal, round, and reactive to light. Conjunctivae are normal.  Cardiovascular: Normal rate and regular rhythm.  Murmur (SEM II/VI RUSB and LUSB) heard. DP and PT pulses present bilaterally.  Respiratory: Effort normal and breath sounds normal. No respiratory distress.  GI: Soft. She exhibits no mass. There is no tenderness.  Musculoskeletal: She exhibits edema (RLE edema ,non pitting. ).  Lymphadenopathy:    She has no cervical adenopathy.  Neurological: She is alert and oriented to person, place, and time. She has normal strength.  She is in a wheel chair,unable to stand up due to pain.  Skin: Skin is warm. No rash noted. No erythema.  Psychiatric: Her mood appears anxious. Her affect is labile. She exhibits a depressed mood. She expresses no suicidal ideation. She expresses no suicidal plans.  Poor groomed, good eye contact. Shaking/feeling cold.    ASSESSMENT AND PLAN:  [name] was seen today for hospitalization follow-up.   Orders Placed This Encounter  Procedures  . Basic metabolic panel  . CBC  . POC INR   Lab Results  Component Value Date    CREATININE 5.42 (HH) 01/05/2018   BUN 15 01/05/2018   NA 140 01/05/2018   K 3.8 01/05/2018   CL 94 (L) 01/05/2018   CO2 34 (H) 01/05/2018   Lab Results  Component Value Date   WBC 7.2 01/05/2018   HGB 12.0 01/05/2018   HCT 36.2 01/05/2018   MCV 88.7 01/05/2018   PLT 272.0 01/05/2018    Hypertensive renal disease, malignant, with renal failure BP mildly low today.  Given the fact she is reporting episodes of hypotension and presyncope, I recommend decreasing metoprolol succinate from 200 mg daily to 150 mg daily. No changes seen Amlodipine or Cozaar. Continue monitoring BP at home. Follow-up in 3 to 4 weeks.  Anemia of chronic disease Improved. No change in current management.  ESRD on dialysis [facility]) She will resume dialysis 3 times per week. She is planning on arranging appointment with her nephrologist, she is considering stopping dialysis. IV fistula in left upper extremity needs to be revised, she has had AV fistula in right upper extremity in 2013. She would like to  hold on hospice evaluation for now until she discuss this with her nephrologist.   PAD (peripheral artery disease) ([place]) She has an appointment on 02/18/2018. Today peripheral pulses are present. She is still smoking and not interested in smoking cessation for now. She is on Lipitor 40 mg. She will continue pain management with Percocet 5-325 mg 3 times daily as needed.  We discussed side effects in detail, including MS changes, constipation, and falls among some.  Her daughter will administer medication.  Plavix and Aspirin were discontinued, Coumadin was held because elevated INR. She will continue following with Dr. Lucky Cowboy    Unstable gait Aggravated by several of her medical problems: OA, neuropathy, PAD, and medication among some. Fall precautions discussed. She would like a prescription for a motorized wheelchair,Rx given. At this time I will obtain she will benefit from PT.  Long term (current)  use of anticoagulants INR today 5.3. Currently she is not on Coumadin. Planning anticoagulation for 3 months. We discussed some side effects of medication. She will continue following with Coumadin clinic. Instructed about warning signs.   Chronic systolic CHF (congestive heart failure) (HCC) Currently she is asymptomatic. Continue losartan and metoprolol. Continue low-salt diet.   Face to face OV 4:14 pm to 5:06 pm  We discussed the difference between palliative care and hospice care. She would like to hold on hospice evaluation until she discussed stopping dialysis with her nephrologist. She understands consequences of stopping dialysis, she stated that she would like to be comfortable and denies suicidal thoughts. We could arrange palliative care through home health agency. She will let me know when she is ready for hospice evaluation.       Zayvien Canning G. Martinique, MD  [facility]. [place] office.

## 2018-01-05 NOTE — Telephone Encounter (Signed)
Patient is current in office for appt w/ Dr. Martinique.

## 2018-01-05 NOTE — Assessment & Plan Note (Signed)
BP mildly low today.  Given the fact she is reporting episodes of hypotension and presyncope, I recommend decreasing metoprolol succinate from 200 mg daily to 150 mg daily. No changes seen Amlodipine or Cozaar. Continue monitoring BP at home. Follow-up in 3 to 4 weeks.

## 2018-01-05 NOTE — Telephone Encounter (Signed)
Call report for critical labs- Creatinine 5.42 and GFR 9.86, will forward to Dr. Martinique for review.

## 2018-01-05 NOTE — Telephone Encounter (Signed)
Copied from [place] [id number]. Topic: General - Other >> Jan 05, 2018  2:43 PM Margot Ables wrote: Reason for CRM: pt and daughter are refusing visit this week with [facility]. They stated they are going to discuss with Dr. Martinique. Please advise.

## 2018-01-05 NOTE — Assessment & Plan Note (Signed)
She will resume dialysis 3 times per week. She is planning on arranging appointment with her nephrologist, she is considering stopping dialysis. IV fistula in left upper extremity needs to be revised, she has had AV fistula in right upper extremity in 2013. She would like to hold on hospice evaluation for now until she discuss this with her nephrologist.

## 2018-01-05 NOTE — Telephone Encounter (Signed)
Patient called to see if there had been an order sent over for her to have a declot since she was told at [facility] yesterday that her level was at a  300? I checked with Mickel Baas and there has not been an order sent over yet.  The patient is thinking maybe that it got sent over to the preferred Vascular surgeon that works with [facility], instead of here. She is checking into that.

## 2018-01-05 NOTE — Patient Instructions (Addendum)
Pre visit review using our clinic review tool, if applicable. No additional management support is needed unless otherwise documented below in the visit note.   Hold coumadin today, tomorrow and Thursday.  Re-check INR on Friday.   LVM for daughter, [name] with dosing instructions and instructed her to call Villa Herb, RN @ [phone] to verify.  Re-check INR on Friday.

## 2018-01-05 NOTE — Patient Instructions (Addendum)
A few things to remember from today's visit:   PAD (peripheral artery disease) ([place])  Long term (current) use of anticoagulants - Plan: POC INR  Chronic systolic CHF (congestive heart failure) (HCC)  ESRD on dialysis ([place]) - Plan: Basic metabolic panel, CBC  Hypertensive renal disease, malignant, with renal failure  Anemia of chronic disease  Decrease metoprolol from 200 mg daily to 150 mg daily. Continue monitoring blood pressure at home.  Palliative care can be arranged by home health agency while we are deciding on hospice care. Continue Percocet 5-325 mg 3 times per day as needed.  Please call 2 days in advance when you need a new prescription, this is 2 weeks supply.  Continue MiraLAX over-the-counter. I will see you back in 3 to 4 weeks, before if needed.   Please be sure medication list is accurate. If a new problem present, please set up appointment sooner than planned today.

## 2018-01-05 NOTE — Telephone Encounter (Signed)
Message sent to Dr. Jordan for review. 

## 2018-01-05 NOTE — Assessment & Plan Note (Signed)
Improved. No change in current management.

## 2018-01-05 NOTE — Progress Notes (Signed)
I have reviewed documentation from this visit and I agree with recommendations given.  Tanay Misuraca G. Rosilyn Coachman, MD  [facility]. [place] office.   

## 2018-01-06 DIAGNOSIS — D689 Coagulation defect, unspecified: Secondary | ICD-10-CM | POA: Diagnosis not present

## 2018-01-06 DIAGNOSIS — N2581 Secondary hyperparathyroidism of renal origin: Secondary | ICD-10-CM | POA: Diagnosis not present

## 2018-01-06 DIAGNOSIS — N186 End stage renal disease: Secondary | ICD-10-CM | POA: Diagnosis not present

## 2018-01-06 DIAGNOSIS — D631 Anemia in chronic kidney disease: Secondary | ICD-10-CM | POA: Diagnosis not present

## 2018-01-06 DIAGNOSIS — Z992 Dependence on renal dialysis: Secondary | ICD-10-CM | POA: Diagnosis not present

## 2018-01-06 NOTE — Telephone Encounter (Signed)
Lab results were already discussed with [name] during her office visit, 01/05/2018.  Afsheen Antony Martinique, MD

## 2018-01-07 ENCOUNTER — Encounter (INDEPENDENT_AMBULATORY_CARE_PROVIDER_SITE_OTHER): Payer: Self-pay

## 2018-01-07 NOTE — Telephone Encounter (Signed)
[name] with [name] calling to notify patient is refusing nursing home care.

## 2018-01-08 ENCOUNTER — Ambulatory Visit (INDEPENDENT_AMBULATORY_CARE_PROVIDER_SITE_OTHER): Payer: Medicare Other | Admitting: General Practice

## 2018-01-08 DIAGNOSIS — N186 End stage renal disease: Secondary | ICD-10-CM | POA: Diagnosis not present

## 2018-01-08 DIAGNOSIS — Z7901 Long term (current) use of anticoagulants: Secondary | ICD-10-CM | POA: Diagnosis not present

## 2018-01-08 DIAGNOSIS — N2581 Secondary hyperparathyroidism of renal origin: Secondary | ICD-10-CM | POA: Diagnosis not present

## 2018-01-08 DIAGNOSIS — Z992 Dependence on renal dialysis: Secondary | ICD-10-CM | POA: Diagnosis not present

## 2018-01-08 DIAGNOSIS — D631 Anemia in chronic kidney disease: Secondary | ICD-10-CM | POA: Diagnosis not present

## 2018-01-08 LAB — POCT INR: INR: 2.2 (ref 2.0–3.0)

## 2018-01-08 NOTE — Patient Instructions (Addendum)
Pre visit review using our clinic review tool, if applicable. No additional management support is needed unless otherwise documented below in the visit note.  Take 1/2 tablet (2.5 mg) daily except take 1 tablet (5 mg) on Wednesdays.  Re-check in 10 days.

## 2018-01-08 NOTE — Telephone Encounter (Signed)
Noted  

## 2018-01-08 NOTE — Telephone Encounter (Signed)
Message sent to Dr. Jordan for review. 

## 2018-01-11 ENCOUNTER — Other Ambulatory Visit (INDEPENDENT_AMBULATORY_CARE_PROVIDER_SITE_OTHER): Payer: Self-pay | Admitting: Nurse Practitioner

## 2018-01-11 DIAGNOSIS — D631 Anemia in chronic kidney disease: Secondary | ICD-10-CM | POA: Diagnosis not present

## 2018-01-11 DIAGNOSIS — Z992 Dependence on renal dialysis: Secondary | ICD-10-CM | POA: Diagnosis not present

## 2018-01-11 DIAGNOSIS — N186 End stage renal disease: Secondary | ICD-10-CM | POA: Diagnosis not present

## 2018-01-11 DIAGNOSIS — N2581 Secondary hyperparathyroidism of renal origin: Secondary | ICD-10-CM | POA: Diagnosis not present

## 2018-01-12 ENCOUNTER — Other Ambulatory Visit: Payer: Self-pay

## 2018-01-12 ENCOUNTER — Emergency Department (HOSPITAL_COMMUNITY): Payer: Medicare Other

## 2018-01-12 ENCOUNTER — Encounter (HOSPITAL_COMMUNITY): Payer: Self-pay | Admitting: *Deleted

## 2018-01-12 ENCOUNTER — Inpatient Hospital Stay (HOSPITAL_COMMUNITY)
Admission: EM | Admit: 2018-01-12 | Discharge: 2018-01-13 | DRG: 193 | Disposition: A | Discharge status: Home / Self Care | Payer: Medicare Other | Attending: Internal Medicine | Admitting: Internal Medicine

## 2018-01-12 DIAGNOSIS — F329 Major depressive disorder, single episode, unspecified: Secondary | ICD-10-CM | POA: Diagnosis present

## 2018-01-12 DIAGNOSIS — F17210 Nicotine dependence, cigarettes, uncomplicated: Secondary | ICD-10-CM | POA: Diagnosis present

## 2018-01-12 DIAGNOSIS — M1990 Unspecified osteoarthritis, unspecified site: Secondary | ICD-10-CM | POA: Diagnosis present

## 2018-01-12 DIAGNOSIS — I739 Peripheral vascular disease, unspecified: Secondary | ICD-10-CM | POA: Diagnosis present

## 2018-01-12 DIAGNOSIS — R062 Wheezing: Secondary | ICD-10-CM | POA: Diagnosis not present

## 2018-01-12 DIAGNOSIS — E785 Hyperlipidemia, unspecified: Secondary | ICD-10-CM | POA: Diagnosis present

## 2018-01-12 DIAGNOSIS — I5032 Chronic diastolic (congestive) heart failure: Secondary | ICD-10-CM | POA: Diagnosis present

## 2018-01-12 DIAGNOSIS — R069 Unspecified abnormalities of breathing: Secondary | ICD-10-CM | POA: Diagnosis not present

## 2018-01-12 DIAGNOSIS — J9601 Acute respiratory failure with hypoxia: Secondary | ICD-10-CM | POA: Diagnosis present

## 2018-01-12 DIAGNOSIS — J441 Chronic obstructive pulmonary disease with (acute) exacerbation: Secondary | ICD-10-CM | POA: Diagnosis present

## 2018-01-12 DIAGNOSIS — Z86718 Personal history of other venous thrombosis and embolism: Secondary | ICD-10-CM

## 2018-01-12 DIAGNOSIS — R9431 Abnormal electrocardiogram [ECG] [EKG]: Secondary | ICD-10-CM | POA: Diagnosis present

## 2018-01-12 DIAGNOSIS — I499 Cardiac arrhythmia, unspecified: Secondary | ICD-10-CM | POA: Diagnosis not present

## 2018-01-12 DIAGNOSIS — Z841 Family history of disorders of kidney and ureter: Secondary | ICD-10-CM

## 2018-01-12 DIAGNOSIS — Z7901 Long term (current) use of anticoagulants: Secondary | ICD-10-CM

## 2018-01-12 DIAGNOSIS — Z7951 Long term (current) use of inhaled steroids: Secondary | ICD-10-CM | POA: Diagnosis not present

## 2018-01-12 DIAGNOSIS — Z885 Allergy status to narcotic agent status: Secondary | ICD-10-CM

## 2018-01-12 DIAGNOSIS — I132 Hypertensive heart and chronic kidney disease with heart failure and with stage 5 chronic kidney disease, or end stage renal disease: Secondary | ICD-10-CM | POA: Diagnosis present

## 2018-01-12 DIAGNOSIS — N2581 Secondary hyperparathyroidism of renal origin: Secondary | ICD-10-CM | POA: Diagnosis present

## 2018-01-12 DIAGNOSIS — J189 Pneumonia, unspecified organism: Secondary | ICD-10-CM | POA: Diagnosis present

## 2018-01-12 DIAGNOSIS — F32A Depression, unspecified: Secondary | ICD-10-CM | POA: Diagnosis present

## 2018-01-12 DIAGNOSIS — Y95 Nosocomial condition: Secondary | ICD-10-CM | POA: Diagnosis present

## 2018-01-12 DIAGNOSIS — R918 Other nonspecific abnormal finding of lung field: Secondary | ICD-10-CM | POA: Diagnosis not present

## 2018-01-12 DIAGNOSIS — I491 Atrial premature depolarization: Secondary | ICD-10-CM | POA: Diagnosis not present

## 2018-01-12 DIAGNOSIS — I4581 Long QT syndrome: Secondary | ICD-10-CM | POA: Diagnosis present

## 2018-01-12 DIAGNOSIS — G609 Hereditary and idiopathic neuropathy, unspecified: Secondary | ICD-10-CM | POA: Diagnosis present

## 2018-01-12 DIAGNOSIS — E876 Hypokalemia: Secondary | ICD-10-CM | POA: Diagnosis present

## 2018-01-12 DIAGNOSIS — Z992 Dependence on renal dialysis: Secondary | ICD-10-CM | POA: Diagnosis not present

## 2018-01-12 DIAGNOSIS — Z9981 Dependence on supplemental oxygen: Secondary | ICD-10-CM

## 2018-01-12 DIAGNOSIS — R0602 Shortness of breath: Secondary | ICD-10-CM | POA: Diagnosis not present

## 2018-01-12 DIAGNOSIS — K589 Irritable bowel syndrome without diarrhea: Secondary | ICD-10-CM | POA: Diagnosis present

## 2018-01-12 DIAGNOSIS — Z79899 Other long term (current) drug therapy: Secondary | ICD-10-CM | POA: Diagnosis not present

## 2018-01-12 DIAGNOSIS — Z881 Allergy status to other antibiotic agents status: Secondary | ICD-10-CM

## 2018-01-12 DIAGNOSIS — M109 Gout, unspecified: Secondary | ICD-10-CM | POA: Diagnosis present

## 2018-01-12 DIAGNOSIS — Z880 Allergy status to penicillin: Secondary | ICD-10-CM

## 2018-01-12 DIAGNOSIS — J440 Chronic obstructive pulmonary disease with acute lower respiratory infection: Secondary | ICD-10-CM | POA: Diagnosis present

## 2018-01-12 DIAGNOSIS — Z8673 Personal history of transient ischemic attack (TIA), and cerebral infarction without residual deficits: Secondary | ICD-10-CM

## 2018-01-12 DIAGNOSIS — N186 End stage renal disease: Secondary | ICD-10-CM | POA: Diagnosis present

## 2018-01-12 DIAGNOSIS — I120 Hypertensive chronic kidney disease with stage 5 chronic kidney disease or end stage renal disease: Secondary | ICD-10-CM | POA: Diagnosis not present

## 2018-01-12 DIAGNOSIS — F419 Anxiety disorder, unspecified: Secondary | ICD-10-CM | POA: Diagnosis present

## 2018-01-12 DIAGNOSIS — Z882 Allergy status to sulfonamides status: Secondary | ICD-10-CM

## 2018-01-12 DIAGNOSIS — D631 Anemia in chronic kidney disease: Secondary | ICD-10-CM | POA: Diagnosis not present

## 2018-01-12 DIAGNOSIS — Z8249 Family history of ischemic heart disease and other diseases of the circulatory system: Secondary | ICD-10-CM

## 2018-01-12 DIAGNOSIS — Z803 Family history of malignant neoplasm of breast: Secondary | ICD-10-CM

## 2018-01-12 LAB — RENAL FUNCTION PANEL
Albumin: 2 g/dL — ABNORMAL LOW (ref 3.5–5.0)
Anion gap: 12 (ref 5–15)
BUN: 15 mg/dL (ref 8–23)
CO2: 30 mmol/L (ref 22–32)
Calcium: 8.1 mg/dL — ABNORMAL LOW (ref 8.9–10.3)
Chloride: 97 mmol/L — ABNORMAL LOW (ref 98–111)
Creatinine, Ser: 6.17 mg/dL — ABNORMAL HIGH (ref 0.44–1.00)
GFR calc Af Amer: 7 mL/min — ABNORMAL LOW (ref >60)
GFR calc non Af Amer: 6 mL/min — ABNORMAL LOW (ref >60)
Glucose, Bld: 206 mg/dL — ABNORMAL HIGH (ref 70–99)
Phosphorus: 2.5 mg/dL (ref 2.5–4.6)
Potassium: 3.6 mmol/L (ref 3.5–5.1)
Sodium: 139 mmol/L (ref 135–145)

## 2018-01-12 LAB — CBC
HCT: 32.7 % — ABNORMAL LOW (ref 36.0–46.0)
HEMATOCRIT: 34.5 % — AB (ref 36.0–46.0)
Hemoglobin: 11 g/dL — ABNORMAL LOW (ref 12.0–15.0)
Hemoglobin: 10.2 g/dL — ABNORMAL LOW (ref 12.0–15.0)
MCH: 28.6 pg (ref 26.0–34.0)
MCH: 27.8 pg (ref 26.0–34.0)
MCHC: 31.9 g/dL (ref 30.0–36.0)
MCHC: 31.2 g/dL (ref 30.0–36.0)
MCV: 89.6 fL (ref 78.0–100.0)
MCV: 89.1 fL (ref 78.0–100.0)
PLATELETS: 176 10*3/uL (ref 150–400)
Platelets: 174 10*3/uL (ref 150–400)
RBC: 3.85 MIL/uL — AB (ref 3.87–5.11)
RBC: 3.67 MIL/uL — ABNORMAL LOW (ref 3.87–5.11)
RDW: 16.7 % — ABNORMAL HIGH (ref 11.5–15.5)
RDW: 16.8 % — ABNORMAL HIGH (ref 11.5–15.5)
WBC: 8.6 10*3/uL (ref 4.0–10.5)
WBC: 5.3 K/uL (ref 4.0–10.5)

## 2018-01-12 LAB — I-STAT TROPONIN, ED: Troponin i, poc: 0.01 ng/mL (ref 0.00–0.08)

## 2018-01-12 LAB — BASIC METABOLIC PANEL
Anion gap: 13 (ref 5–15)
BUN: 7 mg/dL — ABNORMAL LOW (ref 8–23)
CHLORIDE: 96 mmol/L — AB (ref 98–111)
CO2: 31 mmol/L (ref 22–32)
Calcium: 8.5 mg/dL — ABNORMAL LOW (ref 8.9–10.3)
Creatinine, Ser: 4.63 mg/dL — ABNORMAL HIGH (ref 0.44–1.00)
GFR calc non Af Amer: 8 mL/min — ABNORMAL LOW (ref >60)
GFR, EST AFRICAN AMERICAN: 10 mL/min — AB (ref >60)
Glucose, Bld: 79 mg/dL (ref 70–99)
Potassium: 3.3 mmol/L — ABNORMAL LOW (ref 3.5–5.1)
SODIUM: 140 mmol/L (ref 135–145)

## 2018-01-12 LAB — PROTIME-INR
INR: 1.92
PROTHROMBIN TIME: 21.8 s — AB (ref 11.4–15.2)

## 2018-01-12 MED ORDER — ALLOPURINOL 100 MG PO TABS
100.0000 mg | ORAL_TABLET | ORAL | Status: DC
  Filled 2018-01-12: qty 1

## 2018-01-12 MED ORDER — GLYCOPYRROLATE 1 MG PO TABS
2.0000 mg | ORAL_TABLET | Freq: Two times a day (BID) | ORAL | Status: DC
  Administered 2018-01-12 – 2018-01-13 (×2): 2 mg via ORAL
  Filled 2018-01-12 (×4): qty 2

## 2018-01-12 MED ORDER — B COMPLEX-C PO TABS
1.0000 | ORAL_TABLET | Freq: Every day | ORAL | Status: DC
  Administered 2018-01-13: 1 via ORAL
  Filled 2018-01-12 (×5): qty 1

## 2018-01-12 MED ORDER — RENA-VITE PO TABS: 1.0000 | ORAL_TABLET | Freq: Every day | ORAL | Status: DC

## 2018-01-12 MED ORDER — FAMOTIDINE 20 MG PO TABS
40.0000 mg | ORAL_TABLET | Freq: Once | ORAL | Status: AC
  Administered 2018-01-12: 40 mg via ORAL
  Filled 2018-01-12: qty 2

## 2018-01-12 MED ORDER — METHYLPREDNISOLONE SODIUM SUCC 125 MG IJ SOLR
125.0000 mg | Freq: Once | INTRAMUSCULAR | Status: AC
  Administered 2018-01-12: 125 mg via INTRAVENOUS
  Filled 2018-01-12: qty 2

## 2018-01-12 MED ORDER — SODIUM CHLORIDE 0.9 % IV SOLN
250.0000 mL | INTRAVENOUS | Status: DC | PRN
  Administered 2018-01-12: 250 mL via INTRAVENOUS

## 2018-01-12 MED ORDER — OXYCODONE-ACETAMINOPHEN 5-325 MG PO TABS: 1.0000 | ORAL_TABLET | Freq: Three times a day (TID) | ORAL | Status: DC | PRN

## 2018-01-12 MED ORDER — LIDOCAINE-PRILOCAINE 2.5-2.5 % EX CREA: 1.0000 "application " | TOPICAL_CREAM | CUTANEOUS | Status: DC | PRN

## 2018-01-12 MED ORDER — SODIUM CHLORIDE 0.9 % IV SOLN: 100.0000 mL | INTRAVENOUS | Status: DC | PRN

## 2018-01-12 MED ORDER — WARFARIN - PHARMACIST DOSING INPATIENT
Freq: Every day | Status: DC
Start: 2018-01-12 — End: 2018-01-13

## 2018-01-12 MED ORDER — LIDOCAINE HCL (PF) 1 % IJ SOLN: 5.0000 mL | INTRAMUSCULAR | Status: DC | PRN

## 2018-01-12 MED ORDER — ONDANSETRON HCL 4 MG/2ML IJ SOLN: 4.0000 mg | Freq: Four times a day (QID) | INTRAMUSCULAR | Status: DC | PRN

## 2018-01-12 MED ORDER — PENTAFLUOROPROP-TETRAFLUOROETH EX AERO: 1.0000 "application " | INHALATION_SPRAY | CUTANEOUS | Status: DC | PRN

## 2018-01-12 MED ORDER — RENA-VITE PO TABS
1.0000 | ORAL_TABLET | Freq: Every day | ORAL | Status: DC
  Filled 2018-01-12: qty 1

## 2018-01-12 MED ORDER — HEPARIN SODIUM (PORCINE) 1000 UNIT/ML DIALYSIS: 1000.0000 [IU] | INTRAMUSCULAR | Status: DC | PRN

## 2018-01-12 MED ORDER — LOSARTAN POTASSIUM 50 MG PO TABS
100.0000 mg | ORAL_TABLET | Freq: Every day | ORAL | Status: DC
  Administered 2018-01-13: 100 mg via ORAL
  Filled 2018-01-12 (×2): qty 2

## 2018-01-12 MED ORDER — HEPARIN SODIUM (PORCINE) 1000 UNIT/ML DIALYSIS
20.0000 [IU]/kg | INTRAMUSCULAR | Status: DC | PRN
  Filled 2018-01-12: qty 2

## 2018-01-12 MED ORDER — IPRATROPIUM-ALBUTEROL 0.5-2.5 (3) MG/3ML IN SOLN
3.0000 mL | Freq: Once | RESPIRATORY_TRACT | Status: AC
  Administered 2018-01-12: 3 mL via RESPIRATORY_TRACT
  Filled 2018-01-12: qty 3

## 2018-01-12 MED ORDER — LIDOCAINE-PRILOCAINE 2.5-2.5 % EX CREA
1.0000 "application " | TOPICAL_CREAM | CUTANEOUS | Status: DC | PRN
  Filled 2018-01-12: qty 5

## 2018-01-12 MED ORDER — DULOXETINE HCL 60 MG PO CPEP
60.0000 mg | ORAL_CAPSULE | Freq: Every day | ORAL | Status: DC
  Administered 2018-01-12: 60 mg via ORAL
  Filled 2018-01-12: qty 1

## 2018-01-12 MED ORDER — PREGABALIN 50 MG PO CAPS
100.0000 mg | ORAL_CAPSULE | Freq: Every day | ORAL | Status: DC
  Administered 2018-01-12: 100 mg via ORAL
  Filled 2018-01-12: qty 2

## 2018-01-12 MED ORDER — SEVELAMER CARBONATE 800 MG PO TABS
2400.0000 mg | ORAL_TABLET | Freq: Two times a day (BID) | ORAL | Status: DC
  Administered 2018-01-12: 2400 mg via ORAL
  Filled 2018-01-12: qty 3

## 2018-01-12 MED ORDER — ACETAMINOPHEN 650 MG RE SUPP: 650.0000 mg | Freq: Four times a day (QID) | RECTAL | Status: DC | PRN

## 2018-01-12 MED ORDER — HYDROMORPHONE HCL 1 MG/ML IJ SOLN
1.0000 mg | Freq: Once | INTRAMUSCULAR | Status: AC
  Administered 2018-01-12: 1 mg via INTRAVENOUS
  Filled 2018-01-12: qty 1

## 2018-01-12 MED ORDER — VANCOMYCIN HCL IN DEXTROSE 750-5 MG/150ML-% IV SOLN
750.0000 mg | INTRAVENOUS | Status: DC
  Filled 2018-01-12: qty 150

## 2018-01-12 MED ORDER — WARFARIN SODIUM 5 MG PO TABS
5.0000 mg | ORAL_TABLET | Freq: Once | ORAL | Status: AC
  Administered 2018-01-12: 5 mg via ORAL
  Filled 2018-01-12: qty 1

## 2018-01-12 MED ORDER — POLYETHYLENE GLYCOL 3350 17 G PO PACK
17.0000 g | PACK | Freq: Every day | ORAL | Status: DC
  Administered 2018-01-13: 17 g via ORAL
  Filled 2018-01-12 (×2): qty 1

## 2018-01-12 MED ORDER — SODIUM CHLORIDE 0.9% FLUSH
3.0000 mL | Freq: Two times a day (BID) | INTRAVENOUS | Status: DC
  Administered 2018-01-12 – 2018-01-13 (×2): 3 mL via INTRAVENOUS

## 2018-01-12 MED ORDER — SODIUM CHLORIDE 0.9 % IV SOLN
1.0000 g | INTRAVENOUS | Status: DC
  Administered 2018-01-12 – 2018-01-13 (×2): 1 g via INTRAVENOUS
  Filled 2018-01-12 (×2): qty 1

## 2018-01-12 MED ORDER — POTASSIUM CHLORIDE CRYS ER 20 MEQ PO TBCR: 20.0000 meq | EXTENDED_RELEASE_TABLET | Freq: Once | ORAL | Status: DC

## 2018-01-12 MED ORDER — DOXERCALCIFEROL 4 MCG/2ML IV SOLN
1.0000 ug | INTRAVENOUS | Status: DC
  Filled 2018-01-12 (×2): qty 2

## 2018-01-12 MED ORDER — METHYLPREDNISOLONE SODIUM SUCC 125 MG IJ SOLR
60.0000 mg | Freq: Four times a day (QID) | INTRAMUSCULAR | Status: DC
  Administered 2018-01-12 – 2018-01-13 (×4): 60 mg via INTRAVENOUS
  Filled 2018-01-12 (×4): qty 2

## 2018-01-12 MED ORDER — HEPARIN SODIUM (PORCINE) 1000 UNIT/ML DIALYSIS: 4000.0000 [IU] | INTRAMUSCULAR | Status: DC | PRN

## 2018-01-12 MED ORDER — METHYLPREDNISOLONE SODIUM SUCC 125 MG IJ SOLR: 125.0000 mg | Freq: Once | INTRAMUSCULAR | Status: DC

## 2018-01-12 MED ORDER — ATORVASTATIN CALCIUM 40 MG PO TABS
40.0000 mg | ORAL_TABLET | Freq: Every day | ORAL | Status: DC
  Administered 2018-01-12: 40 mg via ORAL
  Filled 2018-01-12: qty 4
  Filled 2018-01-12 (×2): qty 2

## 2018-01-12 MED ORDER — SODIUM CHLORIDE 0.9 % IV SOLN
INTRAVENOUS | Status: DC
  Administered 2018-01-12: 22:00:00 via INTRAVENOUS

## 2018-01-12 MED ORDER — ALTEPLASE 2 MG IJ SOLR
2.0000 mg | Freq: Once | INTRAMUSCULAR | Status: DC | PRN
  Filled 2018-01-12: qty 2

## 2018-01-12 MED ORDER — CLINDAMYCIN PHOSPHATE 900 MG/50ML IV SOLN: 900.0000 mg | Freq: Once | INTRAVENOUS | Status: DC

## 2018-01-12 MED ORDER — AMLODIPINE BESYLATE 10 MG PO TABS
10.0000 mg | ORAL_TABLET | Freq: Every day | ORAL | Status: DC
  Administered 2018-01-12: 10 mg via ORAL
  Filled 2018-01-12: qty 2

## 2018-01-12 MED ORDER — SODIUM CHLORIDE 0.9 % IV SOLN
2.0000 g | Freq: Once | INTRAVENOUS | Status: AC
  Administered 2018-01-12: 2 g via INTRAVENOUS
  Filled 2018-01-12: qty 2

## 2018-01-12 MED ORDER — POLYETHYLENE GLYCOL 3350 17 GM/SCOOP PO POWD
17.0000 g | Freq: Every day | ORAL | Status: DC
  Filled 2018-01-12: qty 255

## 2018-01-12 MED ORDER — ACETAMINOPHEN 325 MG PO TABS
650.0000 mg | ORAL_TABLET | Freq: Four times a day (QID) | ORAL | Status: DC | PRN
  Administered 2018-01-12: 650 mg via ORAL
  Filled 2018-01-12: qty 2

## 2018-01-12 MED ORDER — SENNOSIDES-DOCUSATE SODIUM 8.6-50 MG PO TABS
1.0000 | ORAL_TABLET | Freq: Two times a day (BID) | ORAL | Status: DC
  Administered 2018-01-12 – 2018-01-13 (×2): 1 via ORAL
  Filled 2018-01-12 (×3): qty 1

## 2018-01-12 MED ORDER — CHLORHEXIDINE GLUCONATE CLOTH 2 % EX PADS
6.0000 | MEDICATED_PAD | Freq: Every day | CUTANEOUS | Status: DC
  Administered 2018-01-12: 6 via TOPICAL

## 2018-01-12 MED ORDER — DEXTROSE 5 % IV SOLN
500.0000 mg | Freq: Two times a day (BID) | INTRAVENOUS | Status: DC
  Administered 2018-01-12: 500 mg via INTRAVENOUS
  Filled 2018-01-12: qty 0.5

## 2018-01-12 MED ORDER — VANCOMYCIN HCL 10 G IV SOLR
1500.0000 mg | Freq: Once | INTRAVENOUS | Status: AC
  Administered 2018-01-12: 1500 mg via INTRAVENOUS
  Filled 2018-01-12: qty 1500

## 2018-01-12 MED ORDER — METOPROLOL SUCCINATE ER 100 MG PO TB24
100.0000 mg | ORAL_TABLET | Freq: Two times a day (BID) | ORAL | Status: DC
  Administered 2018-01-12 – 2018-01-13 (×2): 100 mg via ORAL
  Filled 2018-01-12 (×3): qty 1

## 2018-01-12 MED ORDER — CINACALCET HCL 30 MG PO TABS
90.0000 mg | ORAL_TABLET | Freq: Every day | ORAL | Status: DC
  Administered 2018-01-12: 90 mg via ORAL
  Filled 2018-01-12 (×2): qty 3

## 2018-01-12 MED ORDER — ALBUTEROL SULFATE (2.5 MG/3ML) 0.083% IN NEBU
2.5000 mg | INHALATION_SOLUTION | Freq: Four times a day (QID) | RESPIRATORY_TRACT | Status: DC | PRN
  Administered 2018-01-12: 2.5 mg via RESPIRATORY_TRACT
  Filled 2018-01-12: qty 3

## 2018-01-12 MED ORDER — SODIUM CHLORIDE 0.9% FLUSH
3.0000 mL | Freq: Two times a day (BID) | INTRAVENOUS | Status: DC
  Administered 2018-01-12: 3 mL via INTRAVENOUS

## 2018-01-12 MED ORDER — SODIUM CHLORIDE 0.9% FLUSH: 3.0000 mL | INTRAVENOUS | Status: DC | PRN

## 2018-01-12 MED ORDER — MAGNESIUM SULFATE 2 GM/50ML IV SOLN: 2.0000 g | Freq: Once | INTRAVENOUS | Status: DC

## 2018-01-12 NOTE — Progress Notes (Signed)
Pharmacy Antibiotic Note  [name] is a 78 y.o. female admitted on 01/12/2018 with pneumonia.  Pharmacy has been consulted for Vancomycin dosing and Cefepime dosing.   The patient was originally placed on Aztreonam due to a history of rash with penicillins however the patient has been noted to tolerate cephalosporins previously. Discussed with MD and will transition over. ESRD-MWF  Plan: - Continue Vancomycin 750 mg IV qHD MWF - Start Cefepime 1g IV every 24 hours - Will continue to follow HD schedule/duration, culture results, LOT, and antibiotic de-escalation plans    Height: 5\' 1"  (154.9 cm) Weight: 151 lb (68.5 kg) IBW/kg (Calculated) : 47.8  Temp (24hrs), Avg:98.4 F (36.9 C), Min:98 F (36.7 C), Max:98.7 F (37.1 C)  Recent Labs  Lab 01/12/18 0415  WBC 8.6  CREATININE 4.63*    Estimated Creatinine Clearance: 9.2 mL/min (A) (by C-G formula based on SCr of 4.63 mg/dL (H)).    Allergies  Allergen Reactions  . Tuberculin Tests Hives    "blisters"  . Valacyclovir Other (See Comments)    Confusion and nervousness  . Codeine Nausea And Vomiting  . Penicillins Rash    No problems breathing. Has tolerated omnicef in past without issue Has patient had a PCN reaction causing immediate rash, facial/tongue/throat swelling, SOB or lightheadedness with hypotension: Yes Has patient had a PCN reaction causing severe rash involving mucus membranes or skin necrosis: No Has patient had a PCN reaction that required hospitalization No Has patient had a PCN reaction occurring within the last 10 years: No If all of the above answers are "NO", then may proceed with Cephalosporin  . Sulfamethoxazole Rash    Thank you for allowing pharmacy to be a part of this patient's care.  Alycia Rossetti, PharmD, BCPS Clinical Pharmacist Pager: [phone] Clinical phone for 01/12/2018 from 7a-3:30p: [phone] If after 3:30p, please call main pharmacy at: [phone] Please check AMION for all [place] numbers 01/12/2018 11:09 AM

## 2018-01-12 NOTE — ED Triage Notes (Signed)
Patient had dialysis yest ,per ems family states patient was wheezing yest afternoon, and called asking them which inhaler she could use. Family called 911 upon ems arrival patient has rales, was c/o chest discomfort and having periods of bradycardia heart rate would drop into the 40's. Was place on cpap  For approx. 10 mins and was breathing better. Upon arrival to ed patient is alert oriented has a productive cough at times, states she thought she was getting a cold on fri.

## 2018-01-12 NOTE — Progress Notes (Signed)
Pharmacy Antibiotic Note  [name] is a 77 y.o. female admitted on 01/12/2018 with pneumonia.  Pharmacy has been consulted for Vancomycin dosing. WBC WNL. ESRD on HD MWF.   Plan: Vancomycin 1500 mg IV x 1, then 750 mg IV qHD MWF Aztreonam 2g IV x 1 in the ED, f/u additional gram negative coverage Trend WBC, temp, renal function  F/U infectious work-up Drug levels as indicated   Height: 5\' 1"  (154.9 cm) Weight: 151 lb (68.5 kg) IBW/kg (Calculated) : 47.8  Temp (24hrs), Avg:98 F (36.7 C), Min:98 F (36.7 C), Max:98 F (36.7 C)  Recent Labs  Lab 01/05/18 0932 01/12/18 0415  WBC 7.2 8.6  CREATININE 5.42* 4.63*    Estimated Creatinine Clearance: 9.2 mL/min (A) (by C-G formula based on SCr of 4.63 mg/dL (H)).    Allergies  Allergen Reactions  . Tuberculin Tests Hives    "blisters"  . Valacyclovir Other (See Comments)    Confusion and nervousness  . Codeine Nausea And Vomiting  . Penicillins Rash    No problems breathing. Has tolerated omnicef in past without issue Has patient had a PCN reaction causing immediate rash, facial/tongue/throat swelling, SOB or lightheadedness with hypotension: Yes Has patient had a PCN reaction causing severe rash involving mucus membranes or skin necrosis: No Has patient had a PCN reaction that required hospitalization No Has patient had a PCN reaction occurring within the last 10 years: No If all of the above answers are "NO", then may proceed with Cephalosporin  . Sulfamethoxazole Rash    Narda Bonds 01/12/2018 5:14 AM

## 2018-01-12 NOTE — Care Management Note (Signed)
Case Management Note  Patient Details  Name: [name] MRN: [id number] Date of Birth: 07/13/1940  Subjective/Objective:          Pt admitted with HCAP. She is from home with her daughter.       PCP:  Dr Martinique Insurance: medicare  Pharmacy: [facility] on [address]    Action/Plan: MD please order PT/OT as needed. CM following for d/c needs, physician orders.   Contact: daughter: [name]:: [phone]  Expected Discharge Date:                  Expected Discharge Plan:     In-House Referral:     Discharge planning Services     Post Acute Care Choice:    Choice offered to:     DME Arranged:    DME Agency:     HH Arranged:    HH Agency:     Status of Service:  In process, will continue to follow  If discussed at Long Length of Stay Meetings, dates discussed:    Additional Comments:  Pollie Friar, RN 01/12/2018, 1:20 PM

## 2018-01-12 NOTE — ED Notes (Signed)
Patient c/o pain at base of spine coccyx area , area cleaned and dried pad applied.

## 2018-01-12 NOTE — Progress Notes (Signed)
ANTICOAGULATION CONSULT NOTE - Initial Consult  Pharmacy Consult for Warfarin  Indication: DVT  Allergies  Allergen Reactions  . Tuberculin Tests Hives    "blisters"  . Valacyclovir Other (See Comments)    Confusion and nervousness  . Codeine Nausea And Vomiting  . Penicillins Rash    No problems breathing. Has tolerated omnicef in past without issue Has patient had a PCN reaction causing immediate rash, facial/tongue/throat swelling, SOB or lightheadedness with hypotension: Yes Has patient had a PCN reaction causing severe rash involving mucus membranes or skin necrosis: No Has patient had a PCN reaction that required hospitalization No Has patient had a PCN reaction occurring within the last 10 years: No If all of the above answers are "NO", then may proceed with Cephalosporin  . Sulfamethoxazole Rash   Vital Signs: Temp: 98 F (36.7 C) (08/13 0346) Temp Source: Oral (08/13 0346) BP: 175/55 (08/13 0513) Pulse Rate: 68 (08/13 0513)  Labs: Recent Labs    01/12/18 0415 01/12/18 0438  HGB 11.0*  --   HCT 34.5*  --   PLT 176  --   LABPROT  --  21.8*  INR  --  1.92  CREATININE 4.63*  --     Estimated Creatinine Clearance: 9.2 mL/min (A) (by C-G formula based on SCr of 4.63 mg/dL (H)).   Medical History: Past Medical History:  Diagnosis Date  . Abnormality of gait 03/28/2015  . Adrenal mass ([place])   . 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 Arthritis   . Back pain   . CHF (congestive heart failure) ([place])   . Chronic kidney disease    Hemo MWF  . Congestion of throat    Pt states she has a lot mucus in back throat.  . Constipation   . COPD (chronic obstructive pulmonary disease) ([place])   . Depression   . Dialysis patient [facility])    kidney  . Diverticulitis   . GERD (gastroesophageal reflux disease)   . H/O hiatal hernia   . High cholesterol   . Hoarseness of voice   . Hyperlipidemia   . Hypertension   . IBS (irritable  bowel syndrome)   . Meralgia paresthetica of right side 12/26/2014  . Normal cardiac stress test 12/24/2009   lexiscan, imaging normal  . PAD (peripheral artery disease) ([place]) 12/21/2017  . Pneumonia    [id number]  . Renal disorder   . RLS (restless legs syndrome)   . Seizures ([place])    2004 past brain surgery  . Sinus complaint   . Stroke ([place])    TIAs per patient 2 or 3  . Thyroid disease   . TIA (transient ischemic attack)   . Tubular adenoma of colon 01/2008    Assessment: 77 y/o F on warfarin PTA for DVT, INR below goal at 1.92, Hgb 11  Warfarin PTA dose: 5 mg on Wed, 2.5 mg all other days  Goal of Therapy:  INR 2-3 Monitor platelets by anticoagulation protocol: Yes   Plan:  Warfarin 5 mg PO x 1 at 1800 Daily PT/INR Monitor for bleeding   Narda Bonds 01/12/2018,6:10 AM

## 2018-01-12 NOTE — Progress Notes (Signed)
Patient declined AM meds at this time, she verbalized that she will take the evening ones.   Ave Filter, RN

## 2018-01-12 NOTE — ED Notes (Signed)
Patient states she very seldom produces urine

## 2018-01-12 NOTE — Progress Notes (Signed)
PROGRESS NOTE  [name] GXQ:[id number] DOB: [birth year] DOA: 01/12/2018 PCP: Martinique, Betty G, MD  Brief Narrative: 77 year old woman PMH including end-stage renal disease, chronic diastolic CHF, recent leg DVT, COPD, ongoing cigarette smoking, presented with cough, malaise, shortness of breath, wheezing, productive cough.  Hypoxic in the field requiring CPAP.  For treatment of pneumonia.  Assessment/Plan Acute hypoxic respiratory failure secondary to right middle lobe pneumonia, requiring BiPAP prior to presentation to the emergency department. --Continue empiric antibiotics.  Wean oxygen as tolerated.  COPD exacerbation --Continue systemic steroids, antibiotics, bronchodilators  End-stage renal disease --Routine consult nephrology for hemodialysis if patient not discharged 8/14.  Typically if discharged before 11 AM, the patient can go to dialysis center as an outpatient, this can be coordinated through charge nurse/social work.  Chronic diastolic CHF --Appears compensated.  Continue beta-blocker, ARB.  Recent DVT of right leg --Continue warfarin  Prolonged QT --Recheck EKG 8/14 to reassess, avoid Zofran  Cigarette smoker --Smoking cessation counseling: 3 min Ask: has quit in past, has tried nicotine patch and Chantix but couldn't afford long-term Advise: advised to quit Assess willingness: minimal Assist with meds: nictotine patch too expensive, yet buys one pack cig per day for $6/day.  DVT prophylaxis: warfarin per pharmcy Code Status: Full Family Communication: none Disposition Plan: home    Murray Hodgkins, MD  [facility] Direct contact: [phone] --[address] [place]  --[email or web address]; password [email or web address]  7PM-7AM contact night coverage as above 01/12/2018, 12:35 PM  LOS: 0 days   Consultants:    Procedures:    Antimicrobials:  Cefepime 8/13 >   Vancomycin 8/13 >  Interval history/Subjective: Still SOB. Dry cough. Breathing not back to  baseline. Social stressors at home.  Objective: Vitals:  Vitals:   01/12/18 0753 01/12/18 1201  BP: (!) 167/53 (!) 174/45  Pulse: 82 71  Resp: 19 19  Temp: 98.7 F (37.1 C) 98.4 F (36.9 C)  SpO2: 92% 98%    Exam:  Constitutional:  . Appears calm and comfortable Eyes:  . pupils and irises appear normal . Normal lids  ENMT:  . grossly normal hearing  Respiratory:  . Rhonchi noted, no w/rales . Respiratory effort moderately increased, speaks in short sentences, gets winded talking Cardiovascular:  . RRR, no m/r/g . No LE extremity edema   Musculoskeletal:  . Digits/nails BUE: no clubbing, cyanosis, petechiae, infection Psychiatric:  . Mental status o Mood, affect appropriate . judgment and insight appear intact    I have personally reviewed the following:   Labs:  Potassium 3.3, creatinine 4.63, anion gap 13  Troponin negative  Hemoglobin stable, 11.0.  WBC and platelets within normal limits.  INR 1.92.  Imaging studies:  Chest x-ray right perihilar infiltrate.  Independently reviewed.  Medical tests:  EKG independently reviewed, sinus rhythm, prolonged QT.  Scheduled Meds: . allopurinol  100 mg Oral Once per day on Sun Tue Thu Sat  . amLODipine  10 mg Oral QHS  . atorvastatin  40 mg Oral q1800  . B-complex with vitamin C  1 tablet Oral Daily  . Chlorhexidine Gluconate Cloth  6 each Topical Q0600  . cinacalcet  90 mg Oral QHS  . doxercalciferol  1 mcg Intravenous Q M,W,F-HD  . DULoxetine  60 mg Oral QHS  . glycopyrrolate  2 mg Oral BID  . losartan  100 mg Oral Daily  . methylPREDNISolone (SOLU-MEDROL) injection  125 mg Intravenous Once  . methylPREDNISolone (SOLU-MEDROL) injection  60 mg Intravenous Q6H  . metoprolol succinate  100 mg Oral BID  . [START ON 01/13/2018] multivitamin  1 tablet Oral QHS  . polyethylene glycol  17 g Oral Daily  . potassium chloride  20 mEq Oral Once  . pregabalin  100 mg Oral QHS  . senna-docusate  1 tablet Oral BID    . sevelamer carbonate  2,400 mg Oral BID WC  . sodium chloride flush  3 mL Intravenous Q12H  . sodium chloride flush  3 mL Intravenous Q12H  . warfarin  5 mg Oral ONCE-1800  . Warfarin - Pharmacist Dosing Inpatient   Does not apply q1800   Continuous Infusions: . sodium chloride    . ceFEPime (MAXIPIME) IV    . magnesium sulfate 1 - 4 g bolus IVPB    . [START ON 01/13/2018] vancomycin      Principal Problem:   HCAP (healthcare-associated pneumonia) Active Problems:   Anxiety and depression   COPD exacerbation (H[facility])   ESRD on dialysis ([place])   Hypokalemia   History of DVT of lower extremity   Prolonged QT interval   LOS: 0 days    Time approximately 1030- 1105.  Reviewed chart, discussed with patient, updated treatment plan.

## 2018-01-12 NOTE — ED Provider Notes (Signed)
[facility]ERGENCY DEPARTMENT Provider Note   CSN: [id number] Arrival date & time: 01/12/18  0334     History   Chief Complaint Chief Complaint  Patient presents with  . Shortness of Breath  . Bradycardia    HPI [name] is a 77 y.o. female.  The history is provided by the patient and a relative.  Shortness of Breath  This is a new problem. The average episode lasts 1 day. The problem occurs frequently.The problem has been gradually worsening. Associated symptoms include cough and sputum production. Pertinent negatives include no fever, no hemoptysis, no chest pain and no abdominal pain.  Patient with history of CHF, ESRD on dialysis, TIA presents with fatigue, cough and shortness of breath.  She reports over the past several days she is been having increasing cough and shortness of breath.  She went to dialysis yesterday and since that time she is been having increasing generalized weakness.  She is been feeling lightheaded, but denies syncope.  No chest pain.  She recently was found to have a blood clot in her right leg, and is currently on Coumadin.  Past Medical History:  Diagnosis Date  . Abnormality of gait 03/28/2015  . Adrenal mass ([place])   . 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 Arthritis   . Back pain   . CHF (congestive heart failure) ([place])   . Chronic kidney disease    Hemo MWF  . Congestion of throat    Pt states she has a lot mucus in back throat.  . Constipation   . COPD (chronic obstructive pulmonary disease) ([place])   . Depression   . Dialysis patient [facility])    kidney  . Diverticulitis   . GERD (gastroesophageal reflux disease)   . H/O hiatal hernia   . High cholesterol   . Hoarseness of voice   . Hyperlipidemia   . Hypertension   . IBS (irritable bowel syndrome)   . Meralgia paresthetica of right side 12/26/2014  . Normal cardiac stress test 12/24/2009   lexiscan, imaging normal  . PAD  (peripheral artery disease) ([place]) 12/21/2017  . Pneumonia    [id number]  . Renal disorder   . RLS (restless legs syndrome)   . Seizures ([place])    2004 past brain surgery  . Sinus complaint   . Stroke ([place])    TIAs per patient 2 or 3  . Thyroid disease   . TIA (transient ischemic attack)   . Tubular adenoma of colon 01/2008    Patient Active Problem List   Diagnosis Date Noted  . Long term (current) use of anticoagulants 01/04/2018  . Right leg pain 12/21/2017  . PAD (peripheral artery disease) ([place]) 12/21/2017  . Leukocytosis 12/21/2017  . Hyperlipidemia 12/01/2017  . Atherosclerosis of native arteries of extremity with rest pain ([place]) 12/01/2017  . LLQ abdominal pain 08/10/2017  . Left bundle branch block 07/24/2017  . Chest pain 07/24/2017  . Weakness 12/23/2016  . Generalized weakness 12/22/2016  . Elevated troponin 12/06/2016  . HCAP (healthcare-associated pneumonia) 12/06/2016  . Hypokalemia 12/06/2016  . Chronic systolic CHF (congestive heart failure) ([place]) 12/06/2016  . Osteopenia 09/08/2016  . Allergic rhinitis 08/07/2016  . BMI 32.0-32.9,adult 08/07/2016  . Onychomycosis 05/09/2016  . Nonspecific chest pain 04/19/2016  . ACS (acute coronary syndrome) ([place]) 04/19/2016  . Leg pain, bilateral 12/05/2015  . Trouble in sleeping 11/22/2015  . End stage renal disease (  [place])   . Hyperkalemia 11/12/2015  . Acute on chronic respiratory failure with hypoxia ([place]) 11/12/2015  . ESRD on dialysis ([place])   . Chronic obstructive pulmonary disease ([place])   . Hypertensive urgency 09/20/2015  . Fecal incontinence   . Adverse effects of medication 06/21/2015  . Herpes 06/19/2015  . Skin lesion 06/16/2015  . Recurrent genital herpes 06/16/2015  . COPD exacerbation ([place]) 06/05/2015  . Onychocryptosis 04/08/2015  . Unstable gait 03/28/2015  . Renal mass   . Hypertension   . Inadequate pain control 12/30/2014  . CHF (congestive heart failure) ([place]) 12/30/2014  . Meralgia  paresthetica of right side 12/26/2014  . Hereditary and idiopathic peripheral neuropathy 02/15/2013  . Dermatitis of face 08/19/2012  . HIP PAIN, BILATERAL 12/07/2008  . BLADDER PROLAPSE 11/17/2007  . OSTEOARTHRITIS, GENERALIZED, MULTIPLE JOINTS 08/18/2007  . 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 Anxiety and depression 04/23/2007  . Anemia of chronic disease 03/31/2007  . TOBACCO ABUSE 12/10/2006  . HIATAL HERNIA 12/10/2006  . Dysphagia 12/10/2006  . HYPERCHOLESTEROLEMIA 07/30/2006  . Gout, unspecified 07/30/2006  . Major depression in partial remission ([place]) 07/30/2006  . Hypertensive renal disease, malignant, with renal failure 07/30/2006  . GASTROESOPHAGEAL REFLUX, NO ESOPHAGITIS 07/30/2006    Past Surgical History:  Procedure Laterality Date  . A/V SHUNT INTERVENTION N/A 08/13/2017   Procedure: A/V SHUNT INTERVENTION;  Surgeon: Algernon Huxley, MD;  Location: [facility];  Service: Cardiovascular;  Laterality: N/A;  . A/V SHUNTOGRAM Left 08/13/2017   Procedure: A/V SHUNTOGRAM;  Surgeon: Algernon Huxley, MD;  Location: [facility];  Service: Cardiovascular;  Laterality: Left;  . ABDOMINAL HYSTERECTOMY    . ANAL RECTAL MANOMETRY N/A 07/25/2015   Procedure: ANO RECTAL MANOMETRY;  Surgeon: Mauri Pole, MD;  Location: [facility];  Service: Endoscopy;  Laterality: N/A;  . APPENDECTOMY    . AV FISTULA PLACEMENT Left 11/11/2012   Procedure: INSERTION OF ARTERIOVENOUS (AV) GORE-TEX GRAFT ARM;  Surgeon: Angelia Mould, MD;  Location: [facility];  Service: Vascular;  Laterality: Left;  . [place] REMOVAL Left 11/18/2012   Procedure: REMOVAL OF LEFT UPPER ARM ARTERIOVENOUS GORETEX GRAFT ([place]);  Surgeon: Angelia Mould, MD;  Location: [facility];  Service: Vascular;  Laterality: Left;  . CARDIAC CATHETERIZATION  2003   normal  . CHOLECYSTECTOMY     Open mid line incision  . frontal craniotomy  2002   indication = sinusitis  . INSERTION OF DIALYSIS  CATHETER     Left  . LOWER EXTREMITY ANGIOGRAPHY Right 12/17/2017   Procedure: LOWER EXTREMITY ANGIOGRAPHY;  Surgeon: Algernon Huxley, MD;  Location: [facility];  Service: Cardiovascular;  Laterality: Right;  . NASAL SINUS SURGERY Bilateral 2002  . PATCH ANGIOPLASTY Left 11/18/2012   Procedure: PATCH ANGIOPLASTY;  Surgeon: Angelia Mould, MD;  Location: [facility];  Service: Vascular;  Laterality: Left;  . PERIPHERAL VASCULAR CATHETERIZATION Left 03/08/2015   Procedure: A/V Shuntogram/Fistulagram;  Surgeon: Algernon Huxley, MD;  Location: [facility];  Service: Cardiovascular;  Laterality: Left;  . PERIPHERAL VASCULAR CATHETERIZATION Left 03/08/2015   Procedure: A/V Shunt Intervention;  Surgeon: Algernon Huxley, MD;  Location: [facility];  Service: Cardiovascular;  Laterality: Left;  . RECTAL ULTRASOUND N/A 10/05/2015   Procedure: ANAL ULTRASOUND WITH PROBE;  Surgeon: Leighton Ruff, MD;  [facility];  Service: Endoscopy;  Laterality: N/A;  . REVISON OF ARTERIOVENOUS FISTULA  05/11/2012   Procedure: REVISON OF ARTERIOVENOUS  FISTULA;  Surgeon: Elam Dutch, MD;  Location: [facility];  Service: Vascular;  Laterality: Right;  . TUBAL LIGATION       OB History    Gravida  5   Para  3   Term  3   Preterm      AB      Living        SAB      TAB      Ectopic      Multiple      Live Births               Home Medications    Prior to Admission medications   Medication Sig Start Date End Date Taking? Authorizing Provider  albuterol (PROVENTIL HFA;VENTOLIN HFA) 108 (90 Base) MCG/ACT inhaler Inhale 2 puffs into the lungs every 4 (four) hours as needed for wheezing or shortness of breath. 03/11/17   Martinique, Betty G, MD  allopurinol (ZYLOPRIM) 100 MG tablet Take 100 mg by mouth once daily on non-dialysis days (Tues/Thurs/Sat/Sun) Patient taking differently: Take 100 mg by mouth See admin instructions. Take one tablet (100 mg) by mouth once daily on  non-dialysis days (Tues/Thurs/Sat/Sun) 11/13/15   Smiley Houseman, MD  amLODipine (NORVASC) 10 MG tablet TAKE 1 TABLET BY MOUTH AT BEDTIME Patient taking differently: TAKE 1 TABLET (10 MG) BY MOUTH AT BEDTIME 06/01/17   [place]ique, Betty G, MD  atorvastatin (LIPITOR) 40 MG tablet Take 1 tablet (40 mg total) by mouth daily. Patient taking differently: Take 40 mg by mouth at bedtime.  02/26/15   Rumley, Byram N, DO  azelastine (ASTELIN) 0.1 % nasal spray Place 1 spray into both nostrils 2 (two) times daily. Use in each nostril as directed Patient taking differently: Place 1 spray into both nostrils 2 (two) times daily as needed. Use in each nostril as directed 08/07/16   Martinique, Betty G, MD  B Complex-C-Zn-Folic Acid (DIALYVITE/ZINC) TABS Take 1 tablet by mouth daily. 09/25/17   [provider]  budesonide-formoterol (SYMBICORT) 160-4.5 MCG/ACT inhaler Inhale 2 puffs into the lungs 2 (two) times daily. 07/26/17   Dana Allan I, MD  cinacalcet (SENSIPAR) 90 MG tablet Take 90 mg by mouth at bedtime.    [provider]  DULoxetine (CYMBALTA) 30 MG capsule Take 2 capsules (60 mg total) by mouth daily. Patient taking differently: Take 60 mg by mouth at bedtime.  01/08/17   Kathrynn Ducking, MD  fluticasone [facility]) 50 MCG/ACT nasal spray Place 2 sprays into both nostrils daily as needed for allergies or rhinitis. 10/29/17   [place]ique, Betty G, MD  glycopyrrolate (ROBINUL) 2 MG tablet Take 1 tablet (2 mg total) by mouth 2 (two) times daily. 08/25/17   Ladene Artist, MD  hydrOXYzine (ATARAX/VISTARIL) 25 MG tablet Take 1 tablet (25 mg total) by mouth every 8 (eight) hours as needed for itching. 09/16/17   [place], Betty G, MD  lidocaine-prilocaine (EMLA) cream Apply 1 application topically See admin instructions. Apply topically one hour before dialysis - Monday, Wednesday, Friday    [provider]  loratadine (CLARITIN) 10 MG tablet 10 mg every other day. Patient taking  differently: Take 10 mg by mouth daily as needed for allergies. 10 mg every other day. 10/29/17   [place]ique, Betty G, MD  losartan (COZAAR) 100 MG tablet Take 1 tablet (100 mg total) by mouth daily. 10/29/17   [place], Betty G, MD  metoprolol succinate (TOPROL-XL) 100 MG 24 hr tablet TAKE 1 TABLET (100  MG)  BY MOUTH TWICE DAILY 10/13/17   [place], Betty G, MD  multivitamin (RENA-VIT) TABS tablet Take 1 tablet by mouth at bedtime. Patient taking differently: Take 1 tablet by mouth daily.  01/01/15   Haney, Amedeo Plenty, MD  oxyCODONE-acetaminophen (PERCOCET) 5-325 MG tablet Take 1 tablet by mouth every 8 (eight) hours as needed for severe pain. 01/05/18   [place], Betty G, MD  polyethylene glycol powder (MIRALAX) powder Take 17 g by mouth daily. 12/29/17   Jola Schmidt, MD  pregabalin (LYRICA) 100 MG capsule Take 100 mg by mouth at bedtime.     [provider]  senna-docusate (SENOKOT-S) 8.6-50 MG tablet Take 1 tablet by mouth 2 (two) times daily. 12/27/17   Georgette Shell, MD  sevelamer carbonate (RENVELA) 800 MG tablet Take 2,400 mg by mouth See admin instructions. Take 3 tablets (2400 mg) by mouth with each meal or snack - once or twice daily    [provider]  warfarin (COUMADIN) 5 MG tablet Take 5 mg Monday Wednesday Friday and 2.5 mg all other days.check inr 7/29. 12/27/17   Georgette Shell, MD    Family History Family History  Problem Relation Age of Onset  . Thyroid cancer Mother   . Heart disease Father   . Hypertension Father   . Heart attack Father   . Lupus Daughter   . Breast cancer Sister   . Thyroid cancer Sister   . Kidney disease Sister   . Colon cancer Neg Hx     Social History Social History   Tobacco Use  . Smoking status: Current Every Day Smoker    Packs/day: 1.00    Years: 60.00    Pack years: 60.00    Types: Cigarettes  . Smokeless tobacco: Never Used  . Tobacco comment: smoker since 77 yo.  1.5 ppd of [place] 100 lights. ; form given 10-23-16    Substance Use Topics  . Alcohol use: No    Alcohol/week: 0.0 standard drinks  . Drug use: No    Comment: No hx of IV drug use     Allergies   Tuberculin tests; Valacyclovir; Codeine; Penicillins; and Sulfamethoxazole   Review of Systems Review of Systems  Constitutional: Negative for fever.  Respiratory: Positive for cough, sputum production and shortness of breath. Negative for hemoptysis.   Cardiovascular: Negative for chest pain.  Gastrointestinal: Negative for abdominal pain.  Neurological: Positive for light-headedness. Negative for syncope.  All other systems reviewed and are negative.    Physical Exam Updated Vital Signs BP (!) 175/55 (BP Location: Right Arm)   Pulse 68   Temp 98 F (36.7 C) (Oral)   Resp 18   Ht 1.549 m (5\' 1" )   Wt 68.5 kg   SpO2 100%   BMI 28.53 kg/m   Physical Exam CONSTITUTIONAL: elderly, chronically ill-appearing HEAD: Normocephalic/atraumatic EYES: EOMI/PERRL ENMT: Mucous membranes dry NECK: supple no meningeal signs SPINE/BACK:entire spine nontender CV: S1/S2 noted, no murmurs/rubs/gallops noted LUNGS: Coarse wheezing bilaterally, mild tachypnea noted ABDOMEN: soft, nontender GU:no cva tenderness NEURO: Pt is awake/alert/appropriate, moves all extremitiesx4.  No facial droop.   EXTREMITIES: pulses normal/equal, full ROM, osseous access noted to left arm, thrill noted.  Pulses intact in bilateral lower extremities. SKIN: warm, color normal PSYCH: no abnormalities of mood noted, alert and oriented to situation   ED Treatments / Results  Labs (all labs ordered are listed, but only abnormal results are displayed) Labs Reviewed  BASIC METABOLIC PANEL - Abnormal; Notable for  the following components:      Result Value   Potassium 3.3 (*)    Chloride 96 (*)    BUN 7 (*)    Creatinine, Ser 4.63 (*)    Calcium 8.5 (*)    GFR calc non Af Amer 8 (*)    GFR calc Af Amer 10 (*)    All other components within normal limits  CBC -  Abnormal; Notable for the following components:   RBC 3.85 (*)    Hemoglobin 11.0 (*)    HCT 34.5 (*)    RDW 16.7 (*)    All other components within normal limits  PROTIME-INR - Abnormal; Notable for the following components:   Prothrombin Time 21.8 (*)    All other components within normal limits  EXPECTORATED SPUTUM ASSESSMENT W REFEX TO RESP CULTURE  GRAM STAIN  STREP PNEUMONIAE URINARY ANTIGEN  LEGIONELLA PNEUMOPHILA SEROGP 1 UR AG  I-STAT TROPONIN, ED    EKG EKG Interpretation  Date/Time:  Tuesday January 12 2018 03:33:14 EDT Ventricular Rate:  84 PR Interval:    QRS Duration: 101 QT Interval:  481 QTC Calculation: 569 R Axis:   28 Text Interpretation:  Sinus rhythm Consider anterior infarct Prolonged QT interval Interpretation limited secondary to artifact Confirmed by Ripley Fraise [phone]) on 01/12/2018 3:59:06 AM   Radiology Dg Chest Portable 1 View  Result Date: 01/12/2018 CLINICAL DATA:  Dialysis yesterday. Wheezing this afternoon. Chest discomfort. Bradycardia. Rales. EXAM: PORTABLE CHEST 1 VIEW COMPARISON:  12/21/2017 FINDINGS: Left central venous dialysis catheter with tip over the right atrium. No pneumothorax. Shallow inspiration with elevation of right hemidiaphragm. Right perihilar infiltrates are new since previous study and may indicate developing pneumonia. Calcification of the aorta. No blunting of costophrenic angles. IMPRESSION: New development of right perihilar infiltrates suggesting developing pneumonia. Electronically Signed   By: Lucienne Capers M.D.   On: 01/12/2018 04:16    Procedures Procedures  Medications Ordered in ED Medications  vancomycin (VANCOCIN) 1,500 mg in sodium chloride 0.9 % 500 mL IVPB (1,500 mg Intravenous New Bag/Given 01/12/18 0611)  vancomycin (VANCOCIN) IVPB 750 mg/150 ml premix (has no administration in time range)  allopurinol (ZYLOPRIM) tablet 100 mg (has no administration in time range)  oxyCODONE-acetaminophen  (PERCOCET/ROXICET) 5-325 MG per tablet 1 tablet (has no administration in time range)  amLODipine (NORVASC) tablet 10 mg (has no administration in time range)  atorvastatin (LIPITOR) tablet 40 mg (has no administration in time range)  losartan (COZAAR) tablet 100 mg (has no administration in time range)  metoprolol succinate (TOPROL-XL) 24 hr tablet 100 mg (has no administration in time range)  DULoxetine (CYMBALTA) DR capsule 60 mg (has no administration in time range)  cinacalcet (SENSIPAR) tablet 90 mg (has no administration in time range)  glycopyrrolate (ROBINUL) tablet 2 mg (has no administration in time range)  polyethylene glycol powder (GLYCOLAX/MIRALAX) container 17 g (has no administration in time range)  senna-docusate (Senokot-S) tablet 1 tablet (has no administration in time range)  sevelamer carbonate (RENVELA) tablet 2,400 mg (has no administration in time range)  pregabalin (LYRICA) capsule 100 mg (has no administration in time range)  DIALYVITE/ZINC TABS 1 tablet (has no administration in time range)  multivitamin (RENA-VIT) tablet 1 tablet (has no administration in time range)  sodium chloride flush (NS) 0.9 % injection 3 mL (has no administration in time range)  sodium chloride flush (NS) 0.9 % injection 3 mL (has no administration in time range)  sodium chloride flush (NS) 0.9 % injection  3 mL (has no administration in time range)  0.9 %  sodium chloride infusion (has no administration in time range)  acetaminophen (TYLENOL) tablet 650 mg (has no administration in time range)    Or  acetaminophen (TYLENOL) suppository 650 mg (has no administration in time range)  aztreonam (AZACTAM) 500 mg in dextrose 5 % 50 mL IVPB (has no administration in time range)  methylPREDNISolone sodium succinate (SOLU-MEDROL) 125 mg/2 mL injection 60 mg (has no administration in time range)  methylPREDNISolone sodium succinate (SOLU-MEDROL) 125 mg/2 mL injection 125 mg (has no administration in  time range)  albuterol (PROVENTIL) (2.5 MG/3ML) 0.083% nebulizer solution 2.5 mg (has no administration in time range)  potassium chloride SA (K-DUR,KLOR-CON) CR tablet 20 mEq (has no administration in time range)  magnesium sulfate IVPB 2 g 50 mL (has no administration in time range)  warfarin (COUMADIN) tablet 5 mg (has no administration in time range)  Warfarin - Pharmacist Dosing Inpatient (has no administration in time range)  ipratropium-albuterol (DUONEB) 0.5-2.5 (3) MG/3ML nebulizer solution 3 mL (3 mLs Nebulization Given 01/12/18 0458)  aztreonam (AZACTAM) 2 g in sodium chloride 0.9 % 100 mL IVPB (0 g Intravenous Stopped 01/12/18 0553)     Initial Impression / Assessment and Plan / ED Course  I have reviewed the triage vital signs and the nursing notes.  Pertinent labs & imaging results that were available during my care of the patient were reviewed by me and considered in my medical decision making (see chart for details).     5:42 AM Patient with history of multiple chronic medical conditions presents with shortness of breath and fatigue.  She appears to have pneumonia on chest x-ray.  Per EMS, she was hypoxic initially was placed on CPAP.  But that is improved and she is no longer on BiPAP.  There is also reports of bradycardia, but here her heart rates been above 60.  Her potassium is normal. She is not hypotensive or septic appearing.  However given her chronic illnesses, I feel she would benefit from hospitalization for treatment of pneumonia. 6:25 AM Discussed with Dr. Myna Hidalgo for admission Patient will be admitted for HCAP Pt And daughter updated on plan Final Clinical Impressions(s) / ED Diagnoses   Final diagnoses:  HCAP (healthcare-associated pneumonia)    ED Discharge Orders    None       Ripley Fraise, MD 01/12/18 [phone]

## 2018-01-12 NOTE — Progress Notes (Signed)
Patient arrived to 3W19 from ED at this time with family. Safety precautions and orders reviewed with patient. TELE applied and confirmed. Pt declined breakfast at this time and will hold renvela per request. No other distress noted or voice. Will continue to monitor.   Ave Filter, RN

## 2018-01-12 NOTE — Consult Note (Addendum)
[name] Renal Consultation Note    Indication for Consultation:  Management of ESRD/hemodialysis; anemia, hypertension/volume and secondary hyperparathyroidism PCP:  HPI: [name] is a 77 y.o. female with ESRD on hemodialysis MWF at [facility]. PMH of FSGS, HTN, COPD on home O2, Grade 2 dystolic dysfunction (last EF 55-60% 07/24/2017) CAD, TIA, diverticulitis, GERD, PAD, recent DVT RLE, AOCD, SHPT.  Last HD 01/11/2018.   Patient presented to ED this AM with C/O SOB, cough and wheezing. Per patient, she developed URI symptoms last Friday which progressively worsened with associated weakness and malaise. She did attend HD yesterday on schedule which did not improve symptoms. She called EMS this AM to be brought to ED for evaluation of symptoms. She denies fever, chills, chest pain.   Upon arrival to ED, labs unremarkable except for hypokalemia-K+ 3.3. WBC 8.6, HGB 11.0 PLT 176. INR 1.92. CXR showed R perihilar infiltrates since last CXR suggesting developing PNA. She has started on been Vancomycin and Aztreonam per primary and admitted for HCAP per primary.    Past Medical History:  Diagnosis Date  . Abnormality of gait 03/28/2015  . Adrenal mass ([place])   . 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 Arthritis   . Back pain   . CHF (congestive heart failure) ([place])   . Chronic kidney disease    Hemo MWF  . Congestion of throat    Pt states she has a lot mucus in back throat.  . Constipation   . COPD (chronic obstructive pulmonary disease) ([place])   . Depression   . Dialysis patient [facility])    kidney  . Diverticulitis   . GERD (gastroesophageal reflux disease)   . H/O hiatal hernia   . High cholesterol   . Hoarseness of voice   . Hyperlipidemia   . Hypertension   . IBS (irritable bowel syndrome)   . Meralgia paresthetica of right side 12/26/2014  . Normal cardiac stress test 12/24/2009   lexiscan, imaging normal   . PAD (peripheral artery disease) ([place]) 12/21/2017  . Pneumonia    [id number]  . Renal disorder   . RLS (restless legs syndrome)   . Seizures ([place])    2004 past brain surgery  . Sinus complaint   . Stroke ([place])    TIAs per patient 2 or 3  . Thyroid disease   . TIA (transient ischemic attack)   . Tubular adenoma of colon 01/2008   Past Surgical History:  Procedure Laterality Date  . A/V SHUNT INTERVENTION N/A 08/13/2017   Procedure: A/V SHUNT INTERVENTION;  Surgeon: Algernon Huxley, MD;  Location: [place] [facility];  Service: Cardiovascular;  Laterality: N/A;  . A/V SHUNTOGRAM Left 08/13/2017   Procedure: A/V SHUNTOGRAM;  Surgeon: Algernon Huxley, MD;  Location: [place] [facility];  Service: Cardiovascular;  Laterality: Left;  . ABDOMINAL HYSTERECTOMY    . ANAL RECTAL MANOMETRY N/A 07/25/2015   Procedure: ANO RECTAL MANOMETRY;  Surgeon: Mauri Pole, MD;  Location: [facility];  Service: Endoscopy;  Laterality: N/A;  . APPENDECTOMY    . AV FISTULA PLACEMENT Left 11/11/2012   Procedure: INSERTION OF ARTERIOVENOUS (AV) GORE-TEX GRAFT ARM;  Surgeon: Angelia Mould, MD;  Location: [place];  Service: Vascular;  Laterality: Left;  . [place] REMOVAL Left 11/18/2012   Procedure: REMOVAL OF LEFT UPPER ARM ARTERIOVENOUS GORETEX GRAFT ([place]);  Surgeon: Angelia Mould, MD;  Location: [place];  Service: Vascular;  Laterality: Left;  . CARDIAC CATHETERIZATION  2003   normal  . CHOLECYSTECTOMY     Open mid line incision  . frontal craniotomy  2002   indication = sinusitis  . INSERTION OF DIALYSIS CATHETER     Left  . LOWER EXTREMITY ANGIOGRAPHY Right 12/17/2017   Procedure: LOWER EXTREMITY ANGIOGRAPHY;  Surgeon: Algernon Huxley, MD;  Location: [place] [facility];  Service: Cardiovascular;  Laterality: Right;  . NASAL SINUS SURGERY Bilateral 2002  . PATCH ANGIOPLASTY Left 11/18/2012   Procedure: PATCH ANGIOPLASTY;  Surgeon: Angelia Mould, MD;  Location: [place];  Service: Vascular;   Laterality: Left;  . PERIPHERAL VASCULAR CATHETERIZATION Left 03/08/2015   Procedure: A/V Shuntogram/Fistulagram;  Surgeon: Algernon Huxley, MD;  Location: [place] [facility];  Service: Cardiovascular;  Laterality: Left;  . PERIPHERAL VASCULAR CATHETERIZATION Left 03/08/2015   Procedure: A/V Shunt Intervention;  Surgeon: Algernon Huxley, MD;  Location: [place] [facility];  Service: Cardiovascular;  Laterality: Left;  . RECTAL ULTRASOUND N/A 10/05/2015   Procedure: ANAL ULTRASOUND WITH PROBE;  Surgeon: Leighton Ruff, MD;  [facility]: [facility];  Service: Endoscopy;  Laterality: N/A;  . REVISON OF ARTERIOVENOUS FISTULA  05/11/2012   Procedure: REVISON OF ARTERIOVENOUS FISTULA;  Surgeon: Elam Dutch, MD;  Location: [place];  Service: Vascular;  Laterality: Right;  . TUBAL LIGATION     Family History  Problem Relation Age of Onset  . Thyroid cancer Mother   . Heart disease Father   . Hypertension Father   . Heart attack Father   . Lupus Daughter   . Breast cancer Sister   . Thyroid cancer Sister   . Kidney disease Sister   . Colon cancer Neg Hx    Social History:  reports that she has been smoking cigarettes. She has a 60.00 pack-year smoking history. She has never used smokeless tobacco. She reports that she does not drink alcohol or use drugs. Allergies  Allergen Reactions  . Tuberculin Tests Hives    "blisters"  . Valacyclovir Other (See Comments)    Confusion and nervousness  . Codeine Nausea And Vomiting  . Penicillins Rash    No problems breathing. Has tolerated omnicef in past without issue Has patient had a PCN reaction causing immediate rash, facial/tongue/throat swelling, SOB or lightheadedness with hypotension: Yes Has patient had a PCN reaction causing severe rash involving mucus membranes or skin necrosis: No Has patient had a PCN reaction that required hospitalization No Has patient had a PCN reaction occurring within the last 10 years: No If all of the above answers are  "NO", then may proceed with Cephalosporin  . Sulfamethoxazole Rash   Prior to Admission medications   Medication Sig Start Date End Date Taking? Authorizing Provider  albuterol (PROVENTIL HFA;VENTOLIN HFA) 108 (90 Base) MCG/ACT inhaler Inhale 2 puffs into the lungs every 4 (four) hours as needed for wheezing or shortness of breath. 03/11/17  Yes Martinique, Betty G, MD  allopurinol (ZYLOPRIM) 100 MG tablet Take 100 mg by mouth once daily on non-dialysis days (Tues/Thurs/Sat/Sun) Patient taking differently: Take 100 mg by mouth See admin instructions. Take one tablet (100 mg) by mouth once daily on non-dialysis days (Tues/Thurs/Sat/Sun) 11/13/15  Yes Smiley Houseman, MD  amLODipine (NORVASC) 10 MG tablet TAKE 1 TABLET BY MOUTH AT BEDTIME Patient taking differently: TAKE 1 TABLET (10 MG) BY MOUTH AT BEDTIME 06/01/17  Yes [place]ique, Betty G, MD  atorvastatin (LIPITOR) 40 MG tablet Take 1  tablet (40 mg total) by mouth daily. Patient taking differently: Take 40 mg by mouth at bedtime.  02/26/15  Yes Rumley, Custar N, DO  azelastine (ASTELIN) 0.1 % nasal spray Place 1 spray into both nostrils 2 (two) times daily. Use in each nostril as directed Patient taking differently: Place 1 spray into both nostrils 2 (two) times daily as needed. Use in each nostril as directed 08/07/16  Yes Martinique, Betty G, MD  B Complex-C-Zn-Folic Acid (DIALYVITE/ZINC) TABS Take 1 tablet by mouth daily. 09/25/17  Yes [provider]  budesonide-formoterol (SYMBICORT) 160-4.5 MCG/ACT inhaler Inhale 2 puffs into the lungs 2 (two) times daily. 07/26/17  Yes Dana Allan I, MD  cinacalcet (SENSIPAR) 90 MG tablet Take 90 mg by mouth at bedtime.   Yes [provider]  DULoxetine (CYMBALTA) 30 MG capsule Take 2 capsules (60 mg total) by mouth daily. Patient taking differently: Take 60 mg by mouth at bedtime.  01/08/17  Yes Kathrynn Ducking, MD  fluticasone [facility]) 50 MCG/ACT nasal spray Place 2 sprays into both nostrils  daily as needed for allergies or rhinitis. 10/29/17  Yes [place]ique, Betty G, MD  glycopyrrolate (ROBINUL) 2 MG tablet Take 1 tablet (2 mg total) by mouth 2 (two) times daily. 08/25/17  Yes Ladene Artist, MD  hydrOXYzine (ATARAX/VISTARIL) 25 MG tablet Take 1 tablet (25 mg total) by mouth every 8 (eight) hours as needed for itching. 09/16/17  Yes [place]ique, Betty G, MD  lidocaine-prilocaine (EMLA) cream Apply 1 application topically See admin instructions. Apply topically one hour before dialysis - Monday, Wednesday, Friday   Yes [provider]  loratadine (CLARITIN) 10 MG tablet 10 mg every other day. Patient taking differently: Take 10 mg by mouth daily as needed for allergies. 10 mg every other day. 10/29/17  Yes [place]ique, Betty G, MD  losartan (COZAAR) 100 MG tablet Take 1 tablet (100 mg total) by mouth daily. 10/29/17  Yes Martinique, Betty G, MD  metoprolol succinate (TOPROL-XL) 100 MG 24 hr tablet TAKE 1 TABLET (100 MG)  BY MOUTH TWICE DAILY Patient taking differently: Take 100 mg by mouth 2 (two) times daily.  10/13/17  Yes [place], Betty G, MD  multivitamin (RENA-VIT) TABS tablet Take 1 tablet by mouth at bedtime. Patient taking differently: Take 1 tablet by mouth daily.  01/01/15  Yes Haney, Alyssa A, MD  oxyCODONE-acetaminophen (PERCOCET) 5-325 MG tablet Take 1 tablet by mouth every 8 (eight) hours as needed for severe pain. 01/05/18  Yes [place], Betty G, MD  polyethylene glycol powder (MIRALAX) powder Take 17 g by mouth daily. 12/29/17  Yes Jola Schmidt, MD  pregabalin (LYRICA) 100 MG capsule Take 100 mg by mouth at bedtime.    Yes [provider]  senna-docusate (SENOKOT-S) 8.6-50 MG tablet Take 1 tablet by mouth 2 (two) times daily. 12/27/17  Yes Georgette Shell, MD  sevelamer carbonate (RENVELA) 800 MG tablet Take 2,400 mg by mouth See admin instructions. Take 3 tablets (2400 mg) by mouth with each meal or snack - once or twice daily   Yes [provider]  warfarin (COUMADIN)  5 MG tablet Take 5 mg Monday Wednesday Friday and 2.5 mg all other days.check inr 7/29. Patient taking differently: Take 2.5-5 mg by mouth See admin instructions. Take 1 tablet on Wednesday then take 1/2 tablet all the other days 12/27/17  Yes Georgette Shell, MD   Current Facility-Administered Medications  Medication Dose Route Frequency Provider Last Rate Last Dose  . 0.9 %  sodium chloride infusion  250 mL Intravenous PRN Opyd, Ilene Qua, MD      . acetaminophen (TYLENOL) tablet 650 mg  650 mg Oral Q6H PRN Opyd, Ilene Qua, MD       Or  . acetaminophen (TYLENOL) suppository 650 mg  650 mg Rectal Q6H PRN Opyd, Ilene Qua, MD      . albuterol (PROVENTIL) (2.5 MG/3ML) 0.083% nebulizer solution 2.5 mg  2.5 mg Nebulization Q6H PRN Opyd, Ilene Qua, MD      . allopurinol (ZYLOPRIM) tablet 100 mg  100 mg Oral Once per day on Sun Tue Thu Sat Vianne Bulls, MD      . amLODipine (NORVASC) tablet 10 mg  10 mg Oral QHS Opyd, Ilene Qua, MD      . atorvastatin (LIPITOR) tablet 40 mg  40 mg Oral q1800 Opyd, Ilene Qua, MD      . B-complex with vitamin C tablet 1 tablet  1 tablet Oral Daily Opyd, Ilene Qua, MD      . ceFEPIme (MAXIPIME) 1 g in sodium chloride 0.9 % 100 mL IVPB  1 g Intravenous Q24H Rolla Flatten, [facility]      . Chlorhexidine Gluconate Cloth 2 % PADS 6 each  6 each Topical Q0600 Valentina Gu, NP   6 each at 01/12/18 0915  . cinacalcet (SENSIPAR) tablet 90 mg  90 mg Oral QHS Opyd, Ilene Qua, MD      . doxercalciferol (HECTOROL) injection 1 mcg  1 mcg Intravenous Q M,W,F-HD Valentina Gu, NP      . DULoxetine (CYMBALTA) DR capsule 60 mg  60 mg Oral QHS Opyd, Ilene Qua, MD      . glycopyrrolate (ROBINUL) tablet 2 mg  2 mg Oral BID Opyd, Ilene Qua, MD      . losartan (COZAAR) tablet 100 mg  100 mg Oral Daily Opyd, Ilene Qua, MD      . magnesium sulfate IVPB 2 g 50 mL  2 g Intravenous Once Opyd, Ilene Qua, MD      . methylPREDNISolone sodium succinate (SOLU-MEDROL) 125 mg/2 mL  injection 125 mg  125 mg Intravenous Once Opyd, Ilene Qua, MD      . methylPREDNISolone sodium succinate (SOLU-MEDROL) 125 mg/2 mL injection 60 mg  60 mg Intravenous Q6H Opyd, Ilene Qua, MD   60 mg at 01/12/18 1130  . metoprolol succinate (TOPROL-XL) 24 hr tablet 100 mg  100 mg Oral BID Opyd, Ilene Qua, MD      . Derrill Memo ON 01/13/2018] multivitamin (RENA-VIT) tablet 1 tablet  1 tablet Oral QHS Samuella Cota, MD      . oxyCODONE-acetaminophen (PERCOCET/ROXICET) 5-325 MG per tablet 1 tablet  1 tablet Oral Q8H PRN Opyd, Ilene Qua, MD      . polyethylene glycol (MIRALAX / GLYCOLAX) packet 17 g  17 g Oral Daily Samuella Cota, MD      . potassium chloride SA (K-DUR,KLOR-CON) CR tablet 20 mEq  20 mEq Oral Once Opyd, Ilene Qua, MD      . pregabalin (LYRICA) capsule 100 mg  100 mg Oral QHS Opyd, Ilene Qua, MD      . senna-docusate (Senokot-S) tablet 1 tablet  1 tablet Oral BID Opyd, Ilene Qua, MD      . sevelamer carbonate (RENVELA) tablet 2,400 mg  2,400 mg Oral BID WC Opyd, Ilene Qua, MD      . sodium chloride flush (NS) 0.9 % injection 3 mL  3 mL Intravenous Q12H  Opyd, Ilene Qua, MD      . sodium chloride flush (NS) 0.9 % injection 3 mL  3 mL Intravenous Q12H Opyd, Ilene Qua, MD      . sodium chloride flush (NS) 0.9 % injection 3 mL  3 mL Intravenous PRN Opyd, Ilene Qua, MD      . Derrill Memo ON 01/13/2018] vancomycin (VANCOCIN) IVPB 750 mg/150 ml premix  750 mg Intravenous Q M,W,F-HD Opyd, Ilene Qua, MD      . warfarin (COUMADIN) tablet 5 mg  5 mg Oral ONCE-1800 Erenest Blank, RPH      . Warfarin - Pharmacist Dosing Inpatient   Does not apply q1800 Erenest Blank [facility]       Labs: Basic Metabolic Panel: Recent Labs  Lab 01/12/18 0415  NA 140  K 3.3*  CL 96*  CO2 31  GLUCOSE 79  BUN 7*  CREATININE 4.63*  CALCIUM 8.5*   Liver Function Tests: No results for input(s): AST, ALT, ALKPHOS, BILITOT, PROT, ALBUMIN in the last 168 hours. No results for input(s): LIPASE, AMYLASE in the last  168 hours. No results for input(s): AMMONIA in the last 168 hours. CBC: Recent Labs  Lab 01/12/18 0415  WBC 8.6  HGB 11.0*  HCT 34.5*  MCV 89.6  PLT 176   Cardiac Enzymes: No results for input(s): CKTOTAL, CKMB, CKMBINDEX, TROPONINI in the last 168 hours. CBG: No results for input(s): GLUCAP in the last 168 hours. Iron Studies: No results for input(s): IRON, TIBC, TRANSFERRIN, FERRITIN in the last 72 hours. Studies/Results: Dg Chest Portable 1 View  Result Date: 01/12/2018 CLINICAL DATA:  Dialysis yesterday. Wheezing this afternoon. Chest discomfort. Bradycardia. Rales. EXAM: PORTABLE CHEST 1 VIEW COMPARISON:  12/21/2017 FINDINGS: Left central venous dialysis catheter with tip over the right atrium. No pneumothorax. Shallow inspiration with elevation of right hemidiaphragm. Right perihilar infiltrates are new since previous study and may indicate developing pneumonia. Calcification of the aorta. No blunting of costophrenic angles. IMPRESSION: New development of right perihilar infiltrates suggesting developing pneumonia. Electronically Signed   By: Lucienne Capers M.D.   On: 01/12/2018 04:16    ROS: As per HPI otherwise negative.   Physical Exam: Vitals:   01/12/18 0600 01/12/18 0630 01/12/18 0753 01/12/18 1201  BP: 135/63 136/86 (!) 167/53 (!) 174/45  Pulse: 90 72 82 71  Resp: (!) 31 (!) 22 19 19   Temp:   98.7 F (37.1 C) 98.4 F (36.9 C)  TempSrc:   Oral Oral  SpO2: 95% 93% 92% 98%  Weight:      Height:         General: Somewhat frail appearing elderly female in no acute distress. Head: Normocephalic, atraumatic, sclera non-icteric, mucus membranes are moist Neck: Supple. JVD not elevated. Lungs: Bilateral breath sounds with scattered coarse rhonchi, few inspiratory wheezes. No WOB.  Heart: RRR with S1 S2. No murmurs, rubs, or gallops appreciated. Abdomen: Soft, non-tender, non-distended with normoactive bowel sounds. No rebound/guarding. No obvious abdominal  masses. M-S:  Strength and tone appear normal for age. Lower extremities: 1+ RLE edema. No edema LLE.  Neuro: Alert and oriented X 3. Moves all extremities spontaneously. Psych:  Responds to questions appropriately with a normal affect. Dialysis Access: LUA AVG + bruit  Dialysis Orders: [facility] MWF 3 hrs 45 min 180 NRe 400/[place] 1.5 69.5 kg 2.0K/2.25 Ca  UFP 2 LUA AVG  -No Heparin -Mircera 50 mcg IV q 2 weeks (last dose 01/06/18 Last HGB 11.4 01/06/18) -Hectorol 1 mcg IV  TIW   Assessment/Plan: 1.  HCAP-started Cefepime and Vanc per primary. 2.  COPD exacerbation-per primary 3.  ESRD -  MWF via AVG. Next HD 01/12/18. K+ 3.3 on adm. 4.0 K bath.  4.  Hypertension/volume  - BP higher this AM. Continue amlodipine, losartan, metoprolol succ. No evidence of volume overload by exam. Weight 68.5. Recheck in HD and if still under EDW, challenge and lower EDW.  5.  Anemia  - HGB 11.0 No ESA needed. Follow HGB 6.  Metabolic bone disease - Ca 8.5 Continue binders, sensipar, VDRA.  7.  Nutrition - Renal diet, nepro, renal vits.   8. H/O Recent DVT-continue coumadin per pharmacy.   Rita H. Owens Shark, NP-C 01/12/2018, 2:30 PM  [employer] [phone]  Renal Attending: I agree with note above.  We will support with IHD. Erling Cruz, MD

## 2018-01-12 NOTE — H&P (Signed)
History and Physical    JARIKA ROBBEN QAS:[id number] DOB: 1941-01-07 DOA: 01/12/2018  PCP: Martinique, Betty G, MD   Patient coming from: Home   Chief Complaint: SOB, wheezing, productive cough   HPI: [name] is a 77 y.o. female with medical history significant for end-stage renal disease on hemodialysis, chronic diastolic CHF, depression, recent DVT on warfarin, COPD, and history of TIA, now presenting to the emergency department for evaluation of shortness of breath with productive cough and wheezing.  Patient reported that she began to develop malaise and cough 3 to 4 days ago and developed acute worsening yesterday afternoon with increased shortness of breath, wheezing, and increased cough productive of thick sputum.  She denies fevers or chills.  She reports continued improvement in her right lower extremity pain and swelling since starting anticoagulation for DVT last month.  Symptoms continued to progress and EMS was called out this morning.  She was found to have significant wheezing and was transported to the ED with CPAP.  ED Course: Upon arrival to the ED, patient is found to be afebrile, saturating adequately on supplemental oxygen, and with vitals otherwise normal.  EKG features a sinus rhythm with QTc interval 569 ms.  Chest x-ray is notable for right perihilar infiltrates concerning for pneumonia.  Chemistry panel is notable for potassium 3.3 and CBC features a mild stable normocytic anemia.  Patient was treated with a DuoNeb and started on antibiotics with vancomycin and aztreonam in the ED.  She remains hemodynamically stable and will be admitted for ongoing evaluation and management of pneumonia with acute exacerbation in COPD.  Review of Systems:  All other systems reviewed and apart from HPI, are negative.  Past Medical History:  Diagnosis Date  . Abnormality of gait 03/28/2015  . Adrenal mass ([place])   . ANEMIA NEC 03/31/2007   Qualifier: Diagnosis of  By: [name] MD, [name]     . Arthritis   . Back pain   . CHF (congestive heart failure) ([place])   . Chronic kidney disease    Hemo MWF  . Congestion of throat    Pt states she has a lot mucus in back throat.  . Constipation   . COPD (chronic obstructive pulmonary disease) ([place])   . Depression   . Dialysis patient [facility])    kidney  . Diverticulitis   . GERD (gastroesophageal reflux disease)   . H/O hiatal hernia   . High cholesterol   . Hoarseness of voice   . Hyperlipidemia   . Hypertension   . IBS (irritable bowel syndrome)   . Meralgia paresthetica of right side 12/26/2014  . Normal cardiac stress test 12/24/2009   lexiscan, imaging normal  . PAD (peripheral artery disease) ([place]) 12/21/2017  . Pneumonia    [id number]  . Renal disorder   . RLS (restless legs syndrome)   . Seizures ([place])    2004 past brain surgery  . Sinus complaint   . Stroke ([place])    TIAs per patient 2 or 3  . Thyroid disease   . TIA (transient ischemic attack)   . Tubular adenoma of colon 01/2008    Past Surgical History:  Procedure Laterality Date  . A/V SHUNT INTERVENTION N/A 08/13/2017   Procedure: A/V SHUNT INTERVENTION;  Surgeon: Algernon Huxley, MD;  Location: [place] CV LAB;  Service: Cardiovascular;  Laterality: N/A;  . A/V SHUNTOGRAM Left 08/13/2017   Procedure: A/V SHUNTOGRAM;  Surgeon: Algernon Huxley, MD;  Location: [place] INVASIVE CV  LAB;  Service: Cardiovascular;  Laterality: Left;  . ABDOMINAL HYSTERECTOMY    . ANAL RECTAL MANOMETRY N/A 07/25/2015   Procedure: ANO RECTAL MANOMETRY;  Surgeon: Mauri Pole, MD;  Location: [facility] ENDOS[facility];  Service: Endoscopy;  Laterality: N/A;  . APPENDECTOMY    . AV FISTULA PLACEMENT Left 11/11/2012   Procedure: INSERTION OF ARTERIOVENOUS (AV) GORE-TEX GRAFT ARM;  Surgeon: Angelia Mould, MD;  Location: [place];  Service: Vascular;  Laterality: Left;  . [place] REMOVAL Left 11/18/2012   Procedure: REMOVAL OF LEFT UPPER ARM ARTERIOVENOUS GORETEX GRAFT ([place]);  Surgeon: Angelia Mould, MD;  Location: [place];  Service: Vascular;  Laterality: Left;  . CARDIAC CATHETERIZATION  2003   normal  . CHOLECYSTECTOMY     Open mid line incision  . frontal craniotomy  2002   indication = sinusitis  . INSERTION OF DIALYSIS CATHETER     Left  . LOWER EXTREMITY ANGIOGRAPHY Right 12/17/2017   Procedure: LOWER EXTREMITY ANGIOGRAPHY;  Surgeon: Algernon Huxley, MD;  Location: [place] CV LAB;  Service: Cardiovascular;  Laterality: Right;  . NASAL SINUS SURGERY Bilateral 2002  . PATCH ANGIOPLASTY Left 11/18/2012   Procedure: PATCH ANGIOPLASTY;  Surgeon: Angelia Mould, MD;  Location: [place];  Service: Vascular;  Laterality: Left;  . PERIPHERAL VASCULAR CATHETERIZATION Left 03/08/2015   Procedure: A/V Shuntogram/Fistulagram;  Surgeon: Algernon Huxley, MD;  Location: [place] CV LAB;  Service: Cardiovascular;  Laterality: Left;  . PERIPHERAL VASCULAR CATHETERIZATION Left 03/08/2015   Procedure: A/V Shunt Intervention;  Surgeon: Algernon Huxley, MD;  Location: [place] CV LAB;  Service: Cardiovascular;  Laterality: Left;  . RECTAL ULTRASOUND N/A 10/05/2015   Procedure: ANAL ULTRASOUND WITH PROBE;  Surgeon: Leighton Ruff, MD;  [facility];  Service: Endoscopy;  Laterality: N/A;  . REVISON OF ARTERIOVENOUS FISTULA  05/11/2012   Procedure: REVISON OF ARTERIOVENOUS FISTULA;  Surgeon: Elam Dutch, MD;  Location: [place];  Service: Vascular;  Laterality: Right;  . TUBAL LIGATION       reports that she has been smoking cigarettes. She has a 60.00 pack-year smoking history. She has never used smokeless tobacco. She reports that she does not drink alcohol or use drugs.  Allergies  Allergen Reactions  . Tuberculin Tests Hives    "blisters"  . Valacyclovir Other (See Comments)    Confusion and nervousness  . Codeine Nausea And Vomiting  . Penicillins Rash    No problems breathing. Has tolerated omnicef in past without issue Has patient had a PCN reaction causing  immediate rash, facial/tongue/throat swelling, SOB or lightheadedness with hypotension: Yes Has patient had a PCN reaction causing severe rash involving mucus membranes or skin necrosis: No Has patient had a PCN reaction that required hospitalization No Has patient had a PCN reaction occurring within the last 10 years: No If all of the above answers are "NO", then may proceed with Cephalosporin  . Sulfamethoxazole Rash    Family History  Problem Relation Age of Onset  . Thyroid cancer Mother   . Heart disease Father   . Hypertension Father   . Heart attack Father   . Lupus Daughter   . Breast cancer Sister   . Thyroid cancer Sister   . Kidney disease Sister   . Colon cancer Neg Hx      Prior to Admission medications   Medication Sig Start Date End Date Taking? Authorizing Provider  albuterol (PROVENTIL HFA;VENTOLIN HFA) 108 (90 Base) MCG/ACT inhaler Inhale  2 puffs into the lungs every 4 (four) hours as needed for wheezing or shortness of breath. 03/11/17  Yes Martinique, Betty G, MD  allopurinol (ZYLOPRIM) 100 MG tablet Take 100 mg by mouth once daily on non-dialysis days (Tues/Thurs/Sat/Sun) Patient taking differently: Take 100 mg by mouth See admin instructions. Take one tablet (100 mg) by mouth once daily on non-dialysis days (Tues/Thurs/Sat/Sun) 11/13/15  Yes Smiley Houseman, MD  amLODipine (NORVASC) 10 MG tablet TAKE 1 TABLET BY MOUTH AT BEDTIME Patient taking differently: TAKE 1 TABLET (10 MG) BY MOUTH AT BEDTIME 06/01/17  Yes Martinique, Betty G, MD  atorvastatin (LIPITOR) 40 MG tablet Take 1 tablet (40 mg total) by mouth daily. Patient taking differently: Take 40 mg by mouth at bedtime.  02/26/15  Yes Rumley, Lydia N, DO  azelastine (ASTELIN) 0.1 % nasal spray Place 1 spray into both nostrils 2 (two) times daily. Use in each nostril as directed Patient taking differently: Place 1 spray into both nostrils 2 (two) times daily as needed. Use in each nostril as directed 08/07/16  Yes  Martinique, Betty G, MD  B Complex-C-Zn-Folic Acid (DIALYVITE/ZINC) TABS Take 1 tablet by mouth daily. 09/25/17  Yes [provider]  budesonide-formoterol (SYMBICORT) 160-4.5 MCG/ACT inhaler Inhale 2 puffs into the lungs 2 (two) times daily. 07/26/17  Yes Dana Allan I, MD  cinacalcet (SENSIPAR) 90 MG tablet Take 90 mg by mouth at bedtime.   Yes [provider]  DULoxetine (CYMBALTA) 30 MG capsule Take 2 capsules (60 mg total) by mouth daily. Patient taking differently: Take 60 mg by mouth at bedtime.  01/08/17  Yes Kathrynn Ducking, MD  fluticasone [facility]) 50 MCG/ACT nasal spray Place 2 sprays into both nostrils daily as needed for allergies or rhinitis. 10/29/17  Yes Martinique, Betty G, MD  glycopyrrolate (ROBINUL) 2 MG tablet Take 1 tablet (2 mg total) by mouth 2 (two) times daily. 08/25/17  Yes Ladene Artist, MD  hydrOXYzine (ATARAX/VISTARIL) 25 MG tablet Take 1 tablet (25 mg total) by mouth every 8 (eight) hours as needed for itching. 09/16/17  Yes Martinique, Betty G, MD  lidocaine-prilocaine (EMLA) cream Apply 1 application topically See admin instructions. Apply topically one hour before dialysis - Monday, Wednesday, Friday   Yes [provider]  loratadine (CLARITIN) 10 MG tablet 10 mg every other day. Patient taking differently: Take 10 mg by mouth daily as needed for allergies. 10 mg every other day. 10/29/17  Yes Martinique, Betty G, MD  losartan (COZAAR) 100 MG tablet Take 1 tablet (100 mg total) by mouth daily. 10/29/17  Yes Martinique, Betty G, MD  metoprolol succinate (TOPROL-XL) 100 MG 24 hr tablet TAKE 1 TABLET (100 MG)  BY MOUTH TWICE DAILY Patient taking differently: Take 100 mg by mouth 2 (two) times daily.  10/13/17  Yes Martinique, Betty G, MD  multivitamin (RENA-VIT) TABS tablet Take 1 tablet by mouth at bedtime. Patient taking differently: Take 1 tablet by mouth daily.  01/01/15  Yes Haney, Alyssa A, MD  oxyCODONE-acetaminophen (PERCOCET) 5-325 MG tablet Take 1 tablet  by mouth every 8 (eight) hours as needed for severe pain. 01/05/18  Yes Martinique, Betty G, MD  polyethylene glycol powder (MIRALAX) powder Take 17 g by mouth daily. 12/29/17  Yes Jola Schmidt, MD  pregabalin (LYRICA) 100 MG capsule Take 100 mg by mouth at bedtime.    Yes [provider]  senna-docusate (SENOKOT-S) 8.6-50 MG tablet Take 1 tablet by mouth 2 (two) times daily. 12/27/17  Yes  Georgette Shell, MD  sevelamer carbonate (RENVELA) 800 MG tablet Take 2,400 mg by mouth See admin instructions. Take 3 tablets (2400 mg) by mouth with each meal or snack - once or twice daily   Yes [provider]  warfarin (COUMADIN) 5 MG tablet Take 5 mg Monday Wednesday Friday and 2.5 mg all other days.check inr 7/29. Patient taking differently: Take 2.5-5 mg by mouth See admin instructions. Take 1 tablet on Wednesday then take 1/2 tablet all the other days 12/27/17  Yes Georgette Shell, MD    Physical Exam: Vitals:   01/12/18 0346 01/12/18 0347 01/12/18 0500 01/12/18 0513  BP: (!) 169/76   (!) 175/55  Pulse: 66   68  Resp: 20   18  Temp: 98 F (36.7 C)     TempSrc: Oral     SpO2: 95%  99% 100%  Weight:  68.5 kg    Height:  5\' 1"  (1.549 m)        Constitutional: NAD, calm  Eyes: PERTLA, lids and conjunctivae normal ENMT: Mucous membranes are moist. Posterior pharynx clear of any exudate or lesions.   Neck: normal, supple, no masses, no thyromegaly Respiratory: Scattered rhonchi, worse on right. Expiratory wheezes. Normal respiratory effort. No accessory muscle use.  Cardiovascular: S1 & S2 heard, regular rate and rhythm. Right pedal edema. Abdomen: No distension, no tenderness, soft. Bowel sounds active.  Musculoskeletal: no clubbing / cyanosis. No joint deformity upper and lower extremities.    Skin: no significant rashes, lesions, ulcers. Warm, dry, well-perfused. Neurologic: No facial asymmetry. Sensation intact. Moving all extremities.  Psychiatric: Alert and oriented  to person, place, and situation. Calm, cooperative.     Labs on Admission: I have personally reviewed following labs and imaging studies  CBC: Recent Labs  Lab 01/05/18 0932 01/12/18 0415  WBC 7.2 8.6  HGB 12.0 11.0*  HCT 36.2 34.5*  MCV 88.7 89.6  PLT 272.0 220   Basic Metabolic Panel: Recent Labs  Lab 01/05/18 0932 01/12/18 0415  NA 140 140  K 3.8 3.3*  CL 94* 96*  CO2 34* 31  GLUCOSE 59* 79  BUN 15 7*  CREATININE 5.42* 4.63*  CALCIUM 8.7 8.5*   GFR: Estimated Creatinine Clearance: 9.2 mL/min (A) (by C-G formula based on SCr of 4.63 mg/dL (H)). Liver Function Tests: No results for input(s): AST, ALT, ALKPHOS, BILITOT, PROT, ALBUMIN in the last 168 hours. No results for input(s): LIPASE, AMYLASE in the last 168 hours. No results for input(s): AMMONIA in the last 168 hours. Coagulation Profile: Recent Labs  Lab 01/05/18 0939 01/08/18 01/12/18 0438  INR 5.3* 2.2 1.92   Cardiac Enzymes: No results for input(s): CKTOTAL, CKMB, CKMBINDEX, TROPONINI in the last 168 hours. BNP (last 3 results) No results for input(s): PROBNP in the last 8760 hours. HbA1C: No results for input(s): HGBA1C in the last 72 hours. CBG: No results for input(s): GLUCAP in the last 168 hours. Lipid Profile: No results for input(s): CHOL, HDL, LDLCALC, TRIG, CHOLHDL, LDLDIRECT in the last 72 hours. Thyroid Function Tests: No results for input(s): TSH, T4TOTAL, FREET4, T3FREE, THYROIDAB in the last 72 hours. Anemia Panel: No results for input(s): VITAMINB12, FOLATE, FERRITIN, TIBC, IRON, RETICCTPCT in the last 72 hours. Urine analysis:    Component Value Date/Time   COLORURINE YELLOW 12/30/2014 1419   APPEARANCEUR CLEAR 12/30/2014 1419   LABSPEC 1.009 12/30/2014 1419   PHURINE 7.5 12/30/2014 1419   GLUCOSEU NEGATIVE 12/30/2014 1419   HGBUR NEGATIVE 12/30/2014 1419  HGBUR negative 03/23/2007 1112   BILIRUBINUR NEGATIVE 12/30/2014 1419   KETONESUR NEGATIVE 12/30/2014 1419    PROTEINUR 100 (A) 12/30/2014 1419   UROBILINOGEN 0.2 12/30/2014 1419   NITRITE NEGATIVE 12/30/2014 1419   LEUKOCYTESUR NEGATIVE 12/30/2014 1419   Sepsis Labs: @LABRCNTIP (procalcitonin:4,lacticidven:4) )No results found for this or any previous visit (from the past 240 hour(s)).   Radiological Exams on Admission: Dg Chest Portable 1 View  Result Date: 01/12/2018 CLINICAL DATA:  Dialysis yesterday. Wheezing this afternoon. Chest discomfort. Bradycardia. Rales. EXAM: PORTABLE CHEST 1 VIEW COMPARISON:  12/21/2017 FINDINGS: Left central venous dialysis catheter with tip over the right atrium. No pneumothorax. Shallow inspiration with elevation of right hemidiaphragm. Right perihilar infiltrates are new since previous study and may indicate developing pneumonia. Calcification of the aorta. No blunting of costophrenic angles. IMPRESSION: New development of right perihilar infiltrates suggesting developing pneumonia. Electronically Signed   By: Lucienne Capers M.D.   On: 01/12/2018 04:16    EKG: Independently reviewed. Sinus rhythm, QTc 569 ms.   Assessment/Plan  1. HCAP  - Presents with SOB, productive cough, and wheezing  - Found to have right-sided PNA on CXR  - She was started on empiric antibiotics in ED with vancomycin and aztreonam  - Check sputum culture and strep pneumo antigen, continue current antibiotics, continue supportive care    2. COPD with acute exacerbation - Likely precipitated by infection as above  - Continue systemic steroid, antibiotics, prn albuterol nebs    3. ESRD  - She reports completing HD on 01/11/18 according to her usual MWF schedule and there is no indication for urgent dialysis on admission  - SLIV, continue current management    4. History of DVT  - Patient was admitted last month with pain and swelling in the RLE, venous doppler was not conclusive but vascular consultant felt that she did have DVT and recommended treatment  - INR is slightly  subtherapeutic on admission; had been supratherapeutic last week  - She reports continued improvement in pain and swelling  - Continue warfarin    5. Prolonged QT interval  - QTc is prolonged to 569 ms on admission  - Continue cardiac monitoring, correct hypokalemia, minimize offending medications   6. Depression  - Continue Cymbalta    7. History of TIA  - Continue statin    8. Chronic diastolic CHF  - Appears well-compensated  - Follow daily wts, continue beta-blocker and ARB     DVT prophylaxis: warfarin  Code Status: Full  Family Communication: Family updated at bedside Consults called: None Admission status: Inpatient     Vianne Bulls, MD [facility] Pager [phone]  If 7PM-7AM, please contact night-coverage [email or web address] Password [facility]  01/12/2018, 6:05 AM

## 2018-01-13 DIAGNOSIS — E876 Hypokalemia: Secondary | ICD-10-CM | POA: Diagnosis not present

## 2018-01-13 DIAGNOSIS — Z992 Dependence on renal dialysis: Secondary | ICD-10-CM | POA: Diagnosis not present

## 2018-01-13 DIAGNOSIS — I4581 Long QT syndrome: Secondary | ICD-10-CM | POA: Diagnosis not present

## 2018-01-13 DIAGNOSIS — I132 Hypertensive heart and chronic kidney disease with heart failure and with stage 5 chronic kidney disease, or end stage renal disease: Secondary | ICD-10-CM | POA: Diagnosis not present

## 2018-01-13 DIAGNOSIS — J189 Pneumonia, unspecified organism: Secondary | ICD-10-CM | POA: Diagnosis not present

## 2018-01-13 DIAGNOSIS — J441 Chronic obstructive pulmonary disease with (acute) exacerbation: Secondary | ICD-10-CM | POA: Diagnosis not present

## 2018-01-13 DIAGNOSIS — J9601 Acute respiratory failure with hypoxia: Secondary | ICD-10-CM | POA: Diagnosis not present

## 2018-01-13 DIAGNOSIS — J440 Chronic obstructive pulmonary disease with acute lower respiratory infection: Secondary | ICD-10-CM | POA: Diagnosis not present

## 2018-01-13 DIAGNOSIS — D631 Anemia in chronic kidney disease: Secondary | ICD-10-CM | POA: Diagnosis not present

## 2018-01-13 DIAGNOSIS — F17210 Nicotine dependence, cigarettes, uncomplicated: Secondary | ICD-10-CM | POA: Diagnosis not present

## 2018-01-13 DIAGNOSIS — N2581 Secondary hyperparathyroidism of renal origin: Secondary | ICD-10-CM | POA: Diagnosis not present

## 2018-01-13 DIAGNOSIS — I5032 Chronic diastolic (congestive) heart failure: Secondary | ICD-10-CM | POA: Diagnosis not present

## 2018-01-13 DIAGNOSIS — I120 Hypertensive chronic kidney disease with stage 5 chronic kidney disease or end stage renal disease: Secondary | ICD-10-CM | POA: Diagnosis not present

## 2018-01-13 DIAGNOSIS — N186 End stage renal disease: Secondary | ICD-10-CM | POA: Diagnosis not present

## 2018-01-13 LAB — RENAL FUNCTION PANEL
ALBUMIN: 1.9 g/dL — AB (ref 3.5–5.0)
ANION GAP: 10 (ref 5–15)
BUN: 19 mg/dL (ref 8–23)
CO2: 28 mmol/L (ref 22–32)
Calcium: 8.3 mg/dL — ABNORMAL LOW (ref 8.9–10.3)
Chloride: 101 mmol/L (ref 98–111)
Creatinine, Ser: 6.51 mg/dL — ABNORMAL HIGH (ref 0.44–1.00)
GFR, EST AFRICAN AMERICAN: 6 mL/min — AB (ref >60)
GFR, EST NON AFRICAN AMERICAN: 6 mL/min — AB (ref >60)
Glucose, Bld: 126 mg/dL — ABNORMAL HIGH (ref 70–99)
PHOSPHORUS: 3.2 mg/dL (ref 2.5–4.6)
POTASSIUM: 4.1 mmol/L (ref 3.5–5.1)
Sodium: 139 mmol/L (ref 135–145)

## 2018-01-13 LAB — CBC
HCT: 32.9 % — ABNORMAL LOW (ref 36.0–46.0)
HEMOGLOBIN: 10.1 g/dL — AB (ref 12.0–15.0)
MCH: 27.6 pg (ref 26.0–34.0)
MCHC: 30.7 g/dL (ref 30.0–36.0)
MCV: 89.9 fL (ref 78.0–100.0)
Platelets: 189 10*3/uL (ref 150–400)
RBC: 3.66 MIL/uL — AB (ref 3.87–5.11)
RDW: 16.8 % — ABNORMAL HIGH (ref 11.5–15.5)
WBC: 6.8 10*3/uL (ref 4.0–10.5)

## 2018-01-13 LAB — EXPECTORATED SPUTUM ASSESSMENT W GRAM STAIN, RFLX TO RESP C

## 2018-01-13 LAB — EXPECTORATED SPUTUM ASSESSMENT W REFEX TO RESP CULTURE

## 2018-01-13 LAB — PROTIME-INR
INR: 2.13
Prothrombin Time: 23.7 seconds — ABNORMAL HIGH (ref 11.4–15.2)

## 2018-01-13 MED ORDER — CEFUROXIME AXETIL 500 MG PO TABS: ORAL_TABLET | ORAL | 0 refills | Status: DC

## 2018-01-13 MED ORDER — PREDNISONE 20 MG PO TABS: 40.0000 mg | ORAL_TABLET | Freq: Every day | ORAL | 0 refills | Status: DC

## 2018-01-13 MED ORDER — WARFARIN SODIUM 2.5 MG PO TABS
2.5000 mg | ORAL_TABLET | Freq: Once | ORAL | Status: DC
  Filled 2018-01-13: qty 1

## 2018-01-13 MED ORDER — DOXERCALCIFEROL 4 MCG/2ML IV SOLN
INTRAVENOUS | Status: AC
  Administered 2018-01-13: 1 ug
  Filled 2018-01-13: qty 2

## 2018-01-13 MED ORDER — PREDNISONE 20 MG PO TABS: 40.0000 mg | ORAL_TABLET | Freq: Every day | ORAL | Status: DC

## 2018-01-13 MED ORDER — VANCOMYCIN HCL IN DEXTROSE 750-5 MG/150ML-% IV SOLN
INTRAVENOUS | Status: AC
  Administered 2018-01-13: 750 mg
  Filled 2018-01-13: qty 150

## 2018-01-13 NOTE — Progress Notes (Signed)
ANTICOAGULATION CONSULT NOTE - Initial Consult  Pharmacy Consult for Warfarin  Indication: DVT  Allergies  Allergen Reactions  . Tuberculin Tests Hives    "blisters"  . Valacyclovir Other (See Comments)    Confusion and nervousness  . Codeine Nausea And Vomiting  . Penicillins Rash    No problems breathing. Has tolerated omnicef in past without issue Has patient had a PCN reaction causing immediate rash, facial/tongue/throat swelling, SOB or lightheadedness with hypotension: Yes Has patient had a PCN reaction causing severe rash involving mucus membranes or skin necrosis: No Has patient had a PCN reaction that required hospitalization No Has patient had a PCN reaction occurring within the last 10 years: No If all of the above answers are "NO", then may proceed with Cephalosporin  . Sulfamethoxazole Rash   Vital Signs: Temp: 98 F (36.7 C) (08/14 0320) Temp Source: Oral (08/14 0320) BP: 130/42 (08/14 0320) Pulse Rate: 63 (08/14 0320)  Labs: Recent Labs    01/12/18 0415 01/12/18 0438 01/12/18 2222 01/13/18 0532  HGB 11.0*  --  10.2*  --   HCT 34.5*  --  32.7*  --   PLT 176  --  174  --   LABPROT  --  21.8*  --  23.7*  INR  --  1.92  --  2.13  CREATININE 4.63*  --  6.17*  --     Estimated Creatinine Clearance: 6.9 mL/min (A) (by C-G formula based on SCr of 6.17 mg/dL (H)).   Medical History: Past Medical History:  Diagnosis Date  . Abnormality of gait 03/28/2015  . Adrenal mass ([place])   . 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 Arthritis   . Back pain   . CHF (congestive heart failure) ([place])   . Chronic kidney disease    Hemo MWF  . Congestion of throat    Pt states she has a lot mucus in back throat.  . Constipation   . COPD (chronic obstructive pulmonary disease) ([place])   . Depression   . Dialysis patient [facility])    kidney  . Diverticulitis   . GERD (gastroesophageal reflux disease)   . H/O hiatal hernia   . High  cholesterol   . Hoarseness of voice   . Hyperlipidemia   . Hypertension   . IBS (irritable bowel syndrome)   . Meralgia paresthetica of right side 12/26/2014  . Normal cardiac stress test 12/24/2009   lexiscan, imaging normal  . PAD (peripheral artery disease) ([place]) 12/21/2017  . Pneumonia    [id number]  . Renal disorder   . RLS (restless legs syndrome)   . Seizures ([place])    2004 past brain surgery  . Sinus complaint   . Stroke ([place])    TIAs per patient 2 or 3  . Thyroid disease   . TIA (transient ischemic attack)   . Tubular adenoma of colon 01/2008    Assessment: 77 y/o F on warfarin PTA for DVT, INR at goal this AM at 2.13.  Hgb 11  Warfarin PTA dose: 5 mg on Wed, 2.5 mg all other days  Goal of Therapy:  INR 2-3 Monitor platelets by anticoagulation protocol: Yes   Plan:  Warfarin 2.5 mg PO x 1 at 1800 Daily PT/INR Monitor for bleeding  Godfrey Tritschler A. Levada Dy, PharmD, [place] Pager: [phone] Please utilize Amion for appropriate phone number to reach the unit pharmacist ([place])   01/13/2018,8:32 AM

## 2018-01-13 NOTE — Progress Notes (Signed)
Pt being discharged from hospital per orders from MD. Pt educated on discharge instructions. Pt verbalized understanding of instructions. All questions and concerns were addressed. Pt's IV was removed prior to discharge. Pt exited hospital via wheelchair accompanied by staff. 

## 2018-01-13 NOTE — Procedures (Signed)
Tol HD treatment. No hemodynamic instability. Estanislado Emms, MD

## 2018-01-13 NOTE — Consult Note (Signed)
   [facility] CM Inpatient Consult   01/13/2018  DUBLIN CANTERO 07-13-1940 [id number]   Patient screened for potential [facility] Care Management services due to unplanned readmission risk score of 46% (extreme) and multiple hospitalizations.   Went to bedside to discuss [place] Management with Ms. Halbleib. She abruptly said " I do not want to hear about it. I am upset and I do not want to be bothered". Offered to leave [place] Management brochure with contact information. Ms. Hanover adamantly responded "no do not leave anything". [facility] Care Management services were declined.    Marthenia Rolling, MSN-Ed, RN,BSN [facility] Liaison [phone]

## 2018-01-13 NOTE — Discharge Summary (Signed)
Physician Discharge Summary  [name] FBP:[id number] DOB: [birth year] DOA: 01/12/2018  PCP: [place], Betty G, MD  Admit date: 01/12/2018 Discharge date: 01/13/2018  Admitted From:  Home  Disposition:  Home   Recommendations for Outpatient Follow-up:  1. Follow up with PCP in 1-2 weeks 2. Please obtain BMP/CBC in one week 3. Needs to finished treatment for PNA    Discharge Condition: stable.  CODE STATUS: full code.  Diet recommendation: Heart Healthy   Brief/Interim Summary: Brief Narrative: 77 year old woman PMH including end-stage renal disease, chronic diastolic CHF, recent leg DVT, COPD, ongoing cigarette smoking, presented with cough, malaise, shortness of breath, wheezing, productive cough.  Hypoxic in the field requiring CPAP.  For treatment of pneumonia.  Assessment/Plan Acute hypoxic respiratory failure secondary to right middle lobe pneumonia, requiring BiPAP prior to presentation to the emergency department. --Continue empiric antibiotics.  Wean oxygen as tolerated. --Oxygen saturation 96 % on RA, she is feeling better, denies dyspnea. Cough improved./  --She completed 2 days of IV antibiotics. She will be discharge on Ceftin for 4 more doses to take after HD days. Doses reviewed with pharmacist.  --stable for discharge.   COPD exacerbation --Continue systemic steroids, antibiotics, bronchodilators --discharge on prednisone 40 mg for 2 more days.   End-stage renal disease --Routine consult nephrology for hemodialysis if patient not discharged 8/14.  --had HD today.   Chronic diastolic CHF --Appears compensated.  Continue beta-blocker, ARB.  Recent DVT of right leg --Continue warfarin, resume home dose.   Prolonged QT --Recheck EKG 8/14 to reassess, avoid Zofran  Cigarette smoker --Smoking cessation counseled.   Discharge Diagnoses:  Principal Problem:   HCAP (healthcare-associated pneumonia) Active Problems:   Anxiety and depression   COPD  exacerbation ([place])   ESRD on dialysis ([place])   Hypokalemia   History of DVT of lower extremity   Prolonged QT interval    Discharge Instructions  Discharge Instructions    Diet - low sodium heart healthy   Complete by:  As directed    Increase activity slowly   Complete by:  As directed      Allergies as of 01/13/2018      Reactions   Tuberculin Tests Hives   "blisters"   Valacyclovir Other (See Comments)   Confusion and nervousness   Codeine Nausea And Vomiting   Penicillins Rash   No problems breathing. Has tolerated omnicef in past without issue Has patient had a PCN reaction causing immediate rash, facial/tongue/throat swelling, SOB or lightheadedness with hypotension: Yes Has patient had a PCN reaction causing severe rash involving mucus membranes or skin necrosis: No Has patient had a PCN reaction that required hospitalization No Has patient had a PCN reaction occurring within the last 10 years: No If all of the above answers are "NO", then may proceed with Cephalosporin   Sulfamethoxazole Rash      Medication List    TAKE these medications   albuterol 108 (90 Base) MCG/ACT inhaler Commonly known as:  PROVENTIL HFA;VENTOLIN HFA Inhale 2 puffs into the lungs every 4 (four) hours as needed for wheezing or shortness of breath.   allopurinol 100 MG tablet Commonly known as:  ZYLOPRIM Take 100 mg by mouth once daily on non-dialysis days (Tues/Thurs/Sat/Sun) What changed:    how much to take  how to take this  when to take this  additional instructions   amLODipine 10 MG tablet Commonly known as:  NORVASC TAKE 1 TABLET BY MOUTH AT BEDTIME What changed:  how much to take  how to take this  when to take this   atorvastatin 40 MG tablet Commonly known as:  LIPITOR Take 1 tablet (40 mg total) by mouth daily. What changed:  when to take this   azelastine 0.1 % nasal spray Commonly known as:  ASTELIN Place 1 spray into both nostrils 2 (two) times  daily. Use in each nostril as directed What changed:    when to take this  reasons to take this   budesonide-formoterol 160-4.5 MCG/ACT inhaler Commonly known as:  SYMBICORT Inhale 2 puffs into the lungs 2 (two) times daily.   cefUROXime 500 MG tablet Commonly known as:  CEFTIN Take 500 mg after HD days.   cinacalcet 90 MG tablet Commonly known as:  SENSIPAR Take 90 mg by mouth at bedtime.   DIALYVITE/ZINC Tabs Take 1 tablet by mouth daily.   DULoxetine 30 MG capsule Commonly known as:  CYMBALTA Take 2 capsules (60 mg total) by mouth daily. What changed:  when to take this   fluticasone 50 MCG/ACT nasal spray Commonly known as:  FLONASE Place 2 sprays into both nostrils daily as needed for allergies or rhinitis.   glycopyrrolate 2 MG tablet Commonly known as:  ROBINUL Take 1 tablet (2 mg total) by mouth 2 (two) times daily.   hydrOXYzine 25 MG tablet Commonly known as:  ATARAX/VISTARIL Take 1 tablet (25 mg total) by mouth every 8 (eight) hours as needed for itching.   lidocaine-prilocaine cream Commonly known as:  EMLA Apply 1 application topically See admin instructions. Apply topically one hour before dialysis - Monday, Wednesday, Friday   loratadine 10 MG tablet Commonly known as:  CLARITIN 10 mg every other day. What changed:    how much to take  how to take this  when to take this  reasons to take this   losartan 100 MG tablet Commonly known as:  COZAAR Take 1 tablet (100 mg total) by mouth daily.   metoprolol succinate 100 MG 24 hr tablet Commonly known as:  TOPROL-XL TAKE 1 TABLET (100 MG)  BY MOUTH TWICE DAILY What changed:    how much to take  how to take this  when to take this  additional instructions   multivitamin Tabs tablet Take 1 tablet by mouth at bedtime. What changed:  when to take this   oxyCODONE-acetaminophen 5-325 MG tablet Commonly known as:  PERCOCET/ROXICET Take 1 tablet by mouth every 8 (eight) hours as needed  for severe pain.   polyethylene glycol powder powder Commonly known as:  GLYCOLAX/MIRALAX Take 17 g by mouth daily.   predniSONE 20 MG tablet Commonly known as:  DELTASONE Take 2 tablets (40 mg total) by mouth daily with breakfast. Start taking on:  01/14/2018   pregabalin 100 MG capsule Commonly known as:  LYRICA Take 100 mg by mouth at bedtime.   senna-docusate 8.6-50 MG tablet Commonly known as:  Senokot-S Take 1 tablet by mouth 2 (two) times daily.   sevelamer carbonate 800 MG tablet Commonly known as:  RENVELA Take 2,400 mg by mouth See admin instructions. Take 3 tablets (2400 mg) by mouth with each meal or snack - once or twice daily   warfarin 5 MG tablet Commonly known as:  COUMADIN Take as directed. If you are unsure how to take this medication, talk to your nurse or doctor. Original instructions:  Take 5 mg Monday Wednesday Friday and 2.5 mg all other days.check inr 7/29. What changed:    how  much to take  how to take this  when to take this  additional instructions       Allergies  Allergen Reactions  . Tuberculin Tests Hives    "blisters"  . Valacyclovir Other (See Comments)    Confusion and nervousness  . Codeine Nausea And Vomiting  . Penicillins Rash    No problems breathing. Has tolerated omnicef in past without issue Has patient had a PCN reaction causing immediate rash, facial/tongue/throat swelling, SOB or lightheadedness with hypotension: Yes Has patient had a PCN reaction causing severe rash involving mucus membranes or skin necrosis: No Has patient had a PCN reaction that required hospitalization No Has patient had a PCN reaction occurring within the last 10 years: No If all of the above answers are "NO", then may proceed with Cephalosporin  . Sulfamethoxazole Rash    Consultations:  Nephrology    Procedures/Studies: Ct Head Wo Contrast  Result Date: 12/25/2017 CLINICAL DATA:  [place] EXAM: CT HEAD WITHOUT CONTRAST TECHNIQUE:  Contiguous axial images were obtained from the base of the skull through the vertex without intravenous contrast. COMPARISON:  MRI 05/30/2017, CT brain 11/14/2013 FINDINGS: Brain: No acute territorial infarction, hemorrhage or intracranial mass. Encephalomalacia in the left parasagittal frontal lobe. Mild atrophy. Stable ventricle size. Vascular: No hyperdense vessels.  Carotid vascular calcification Skull: Prior bifrontal craniotomy. Sinuses/Orbits: Postsurgical changes of the left maxillary and ethmoid sinuses. Mucosal thickening within the ethmoid and maxillary sinuses. No acute orbital abnormality is seen Other: None IMPRESSION: 1. No CT evidence for acute intracranial abnormality. 2. Encephalomalacia in the left frontal lobe. Previous anterior craniotomy. Electronically Signed   By: Donavan Foil M.D.   On: 12/25/2017 19:38   Dg Chest Portable 1 View  Result Date: 01/12/2018 CLINICAL DATA:  Dialysis yesterday. Wheezing this afternoon. Chest discomfort. Bradycardia. Rales. EXAM: PORTABLE CHEST 1 VIEW COMPARISON:  12/21/2017 FINDINGS: Left central venous dialysis catheter with tip over the right atrium. No pneumothorax. Shallow inspiration with elevation of right hemidiaphragm. Right perihilar infiltrates are new since previous study and may indicate developing pneumonia. Calcification of the aorta. No blunting of costophrenic angles. IMPRESSION: New development of right perihilar infiltrates suggesting developing pneumonia. Electronically Signed   By: Lucienne Capers M.D.   On: 01/12/2018 04:16   Dg Chest Port 1 View  Result Date: 12/21/2017 CLINICAL DATA:  77 year old female with acute chest pain today. EXAM: PORTABLE CHEST 1 VIEW COMPARISON:  08/20/2017 and earlier. FINDINGS: Portable AP semi upright view at 0743 hours. Left side HERO graft hemodialysis access redemonstrated. Visible portions appear stable. Chronic elevation of the right hemidiaphragm is stable. Stable mild cardiomegaly and  mediastinal contours. No pneumothorax, pulmonary edema, pleural effusion or consolidation. Mild bibasilar atelectasis appears stable. Paucity of bowel gas in the upper abdomen. Visualized tracheal air column is within normal limits. No acute osseous abnormality identified. IMPRESSION: Chronic bibasilar atelectasis. No acute cardiopulmonary abnormality. Electronically Signed   By: Genevie Ann M.D.   On: 12/21/2017 08:04   Dg Abd 2 Views  Result Date: 12/24/2017 CLINICAL DATA:  Abdominal pain. EXAM: ABDOMEN - 2 VIEW COMPARISON:  CT abdomen and pelvis 08/20/2017 FINDINGS: No intraperitoneal free air is identified. There is stool throughout the colon, with a large amount of stool noted in the ascending colon and rectum. No dilated loops of bowel are seen to suggest obstruction. Atherosclerotic vascular calcifications and pelvic surgical clips are noted. A HERO graft is noted in the chest. Bibasilar lung opacities are similar to a recent chest radiograph from  12/21/2017 and likely represent atelectasis. IMPRESSION: Large amount of colonic and rectal stool. No evidence of bowel obstruction. Electronically Signed   By: Logan Bores M.D.   On: 12/24/2017 10:01       Subjective: Feeling well, denies dyspnea.    Discharge Exam: Vitals:   01/13/18 1100 01/13/18 1241  BP: (!) 163/67 (!) 162/51  Pulse: 72 73  Resp:  19  Temp:  97.6 F (36.4 C)  SpO2:  96%   Vitals:   01/13/18 1000 01/13/18 1030 01/13/18 1100 01/13/18 1241  BP: (!) 139/54 (!) 136/53 (!) 163/67 (!) 162/51  Pulse: 68 68 72 73  Resp:    19  Temp:    97.6 F (36.4 C)  TempSrc:    Axillary  SpO2:    96%  Weight:      Height:        General: Pt is alert, awake, not in acute distress Cardiovascular: RRR, S1/S2 +, no rubs, no gallops Respiratory: CTA bilaterally, no wheezing, no rhonchi Abdominal: Soft, NT, ND, bowel sounds + Extremities: no edema, no cyanosis    The results of significant diagnostics from this hospitalization  (including imaging, microbiology, ancillary and laboratory) are listed below for reference.     Microbiology: Recent Results (from the past 240 hour(s))  Culture, sputum-assessment     Status: None   Collection Time: 01/12/18  9:40 PM  Result Value Ref Range Status   Specimen Description EXPECTORATED SPUTUM  Final   Special Requests NONE  Final   Sputum evaluation   Final    THIS SPECIMEN IS ACCEPTABLE FOR SPUTUM CULTURE Performed at [facility], [address] [place], [place] [address]    Report Status 01/13/2018 FINAL  Final  Culture, respiratory     Status: None (Preliminary result)   Collection Time: 01/12/18  9:40 PM  Result Value Ref Range Status   Specimen Description EXPECTORATED SPUTUM  Final   Special Requests NONE Reflexed from [id number]  Final   Gram Stain   Final    MODERATE WBC PRESENT,BOTH PMN AND MONONUCLEAR NO ORGANISMS SEEN Performed at [facility], [place] [address] [place], [place] [address]    Culture PENDING  Incomplete   Report Status PENDING  Incomplete     Labs: BNP (last 3 results) Recent Labs    07/23/17 1928  BNP 093.2*   Basic Metabolic Panel: Recent Labs  Lab 01/12/18 0415 01/12/18 2222 01/13/18 0824  NA 140 139 139  K 3.3* 3.6 4.1  CL 96* 97* 101  CO2 31 30 28   GLUCOSE 79 206* 126*  BUN 7* 15 19  CREATININE 4.63* 6.17* 6.51*  CALCIUM 8.5* 8.1* 8.3*  PHOS  --  2.5 3.2   Liver Function Tests: Recent Labs  Lab 01/12/18 2222 01/13/18 0824  ALBUMIN 2.0* 1.9*   No results for input(s): LIPASE, AMYLASE in the last 168 hours. No results for input(s): AMMONIA in the last 168 hours. CBC: Recent Labs  Lab 01/12/18 0415 01/12/18 2222 01/13/18 0824  WBC 8.6 5.3 6.8  HGB 11.0* 10.2* 10.1*  HCT 34.5* 32.7* 32.9*  MCV 89.6 89.1 89.9  PLT 176 174 189   Cardiac Enzymes: No results for input(s): CKTOTAL, CKMB, CKMBINDEX, TROPONINI in the last 168 hours. BNP: Invalid input(s): POCBNP CBG: No results for input(s):  GLUCAP in the last 168 hours. D-Dimer No results for input(s): DDIMER in the last 72 hours. Hgb A1c No results for input(s): HGBA1C in the last 72  hours. Lipid Profile No results for input(s): CHOL, HDL, LDLCALC, TRIG, CHOLHDL, LDLDIRECT in the last 72 hours. Thyroid function studies No results for input(s): TSH, T4TOTAL, T3FREE, THYROIDAB in the last 72 hours.  Invalid input(s): FREET3 Anemia work up No results for input(s): VITAMINB12, FOLATE, FERRITIN, TIBC, IRON, RETICCTPCT in the last 72 hours. Urinalysis    Component Value Date/Time   COLORURINE YELLOW 12/30/2014 1419   APPEARANCEUR CLEAR 12/30/2014 1419   LABSPEC 1.009 12/30/2014 1419   PHURINE 7.5 12/30/2014 1419   GLUCOSEU NEGATIVE 12/30/2014 1419   HGBUR NEGATIVE 12/30/2014 1419   HGBUR negative 03/23/2007 1112   BILIRUBINUR NEGATIVE 12/30/2014 1419   KETONESUR NEGATIVE 12/30/2014 1419   PROTEINUR 100 (A) 12/30/2014 1419   UROBILINOGEN 0.2 12/30/2014 1419   NITRITE NEGATIVE 12/30/2014 1419   LEUKOCYTESUR NEGATIVE 12/30/2014 1419   Sepsis Labs Invalid input(s): PROCALCITONIN,  WBC,  LACTICIDVEN Microbiology Recent Results (from the past 240 hour(s))  Culture, sputum-assessment     Status: None   Collection Time: 01/12/18  9:40 PM  Result Value Ref Range Status   Specimen Description EXPECTORATED SPUTUM  Final   Special Requests NONE  Final   Sputum evaluation   Final    THIS SPECIMEN IS ACCEPTABLE FOR SPUTUM CULTURE Performed at [facility], [place] [address], [place], [place] [address]    Report Status 01/13/2018 FINAL  Final  Culture, respiratory     Status: None (Preliminary result)   Collection Time: 01/12/18  9:40 PM  Result Value Ref Range Status   Specimen Description EXPECTORATED SPUTUM  Final   Special Requests NONE Reflexed from [id number]  Final   Gram Stain   Final    MODERATE WBC PRESENT,BOTH PMN AND MONONUCLEAR NO ORGANISMS SEEN Performed at [facility], [place] [address],  [place], [place] [address]    Culture PENDING  Incomplete   Report Status PENDING  Incomplete     Time coordinating discharge: 35 minutes   SIGNED:   Elmarie Shiley, MD  [facility] 01/13/2018, 1:07 PM Pager   If 7PM-7AM, please contact night-coverage [email or web address] Password [email or web address]

## 2018-01-13 NOTE — Progress Notes (Signed)
Patient currently in HD. 

## 2018-01-14 ENCOUNTER — Telehealth: Payer: Self-pay | Admitting: Family Medicine

## 2018-01-14 NOTE — Telephone Encounter (Signed)
Copied from [place] [phone]. Topic: Quick Communication - See Telephone Encounter >> Jan 14, 2018  3:18 PM Rutherford Nail, [place] wrote: CRM for notification. See Telephone encounter for: 01/14/18. Blondell Reveal with [facility] calling and states that the patient is refusing home health care services CB#: [phone]

## 2018-01-14 NOTE — Telephone Encounter (Signed)
Pt daughter stated that her mom does not want to receive the services at this time for Home health. Pt did want home health only to keep her from going to a rehabilitation center and these were the only option given to pt. Pt daughter stated that her mom legs are better and she is walking some without pain and pain medication for 4-5 days from home.

## 2018-01-15 ENCOUNTER — Emergency Department (HOSPITAL_COMMUNITY): Payer: Medicare Other

## 2018-01-15 ENCOUNTER — Other Ambulatory Visit: Payer: Self-pay

## 2018-01-15 ENCOUNTER — Inpatient Hospital Stay (HOSPITAL_COMMUNITY)
Admission: EM | Admit: 2018-01-15 | Discharge: 2018-01-22 | DRG: 535 | Disposition: A | Discharge status: Skilled Nursing Facility | Payer: Medicare Other | Attending: Internal Medicine | Admitting: Internal Medicine

## 2018-01-15 ENCOUNTER — Encounter (HOSPITAL_COMMUNITY): Payer: Self-pay | Admitting: Emergency Medicine

## 2018-01-15 DIAGNOSIS — M47816 Spondylosis without myelopathy or radiculopathy, lumbar region: Secondary | ICD-10-CM | POA: Diagnosis present

## 2018-01-15 DIAGNOSIS — S32599A Other specified fracture of unspecified pubis, initial encounter for closed fracture: Secondary | ICD-10-CM | POA: Diagnosis present

## 2018-01-15 DIAGNOSIS — S80919A Unspecified superficial injury of unspecified knee, initial encounter: Secondary | ICD-10-CM | POA: Diagnosis not present

## 2018-01-15 DIAGNOSIS — W19XXXA Unspecified fall, initial encounter: Secondary | ICD-10-CM | POA: Diagnosis present

## 2018-01-15 DIAGNOSIS — Z8673 Personal history of transient ischemic attack (TIA), and cerebral infarction without residual deficits: Secondary | ICD-10-CM

## 2018-01-15 DIAGNOSIS — Z8249 Family history of ischemic heart disease and other diseases of the circulatory system: Secondary | ICD-10-CM

## 2018-01-15 DIAGNOSIS — Z79891 Long term (current) use of opiate analgesic: Secondary | ICD-10-CM

## 2018-01-15 DIAGNOSIS — D631 Anemia in chronic kidney disease: Secondary | ICD-10-CM | POA: Diagnosis present

## 2018-01-15 DIAGNOSIS — I959 Hypotension, unspecified: Secondary | ICD-10-CM | POA: Diagnosis not present

## 2018-01-15 DIAGNOSIS — G2581 Restless legs syndrome: Secondary | ICD-10-CM | POA: Diagnosis present

## 2018-01-15 DIAGNOSIS — Z992 Dependence on renal dialysis: Secondary | ICD-10-CM

## 2018-01-15 DIAGNOSIS — Z79899 Other long term (current) drug therapy: Secondary | ICD-10-CM

## 2018-01-15 DIAGNOSIS — G4700 Insomnia, unspecified: Secondary | ICD-10-CM | POA: Diagnosis not present

## 2018-01-15 DIAGNOSIS — R4701 Aphasia: Secondary | ICD-10-CM | POA: Diagnosis present

## 2018-01-15 DIAGNOSIS — E669 Obesity, unspecified: Secondary | ICD-10-CM | POA: Diagnosis present

## 2018-01-15 DIAGNOSIS — I10 Essential (primary) hypertension: Secondary | ICD-10-CM | POA: Diagnosis present

## 2018-01-15 DIAGNOSIS — Z7952 Long term (current) use of systemic steroids: Secondary | ICD-10-CM

## 2018-01-15 DIAGNOSIS — M25461 Effusion, right knee: Secondary | ICD-10-CM | POA: Diagnosis not present

## 2018-01-15 DIAGNOSIS — M4807 Spinal stenosis, lumbosacral region: Secondary | ICD-10-CM | POA: Diagnosis present

## 2018-01-15 DIAGNOSIS — G9389 Other specified disorders of brain: Secondary | ICD-10-CM | POA: Diagnosis present

## 2018-01-15 DIAGNOSIS — D696 Thrombocytopenia, unspecified: Secondary | ICD-10-CM | POA: Diagnosis present

## 2018-01-15 DIAGNOSIS — N186 End stage renal disease: Secondary | ICD-10-CM | POA: Diagnosis not present

## 2018-01-15 DIAGNOSIS — J189 Pneumonia, unspecified organism: Secondary | ICD-10-CM | POA: Diagnosis not present

## 2018-01-15 DIAGNOSIS — R471 Dysarthria and anarthria: Secondary | ICD-10-CM | POA: Diagnosis not present

## 2018-01-15 DIAGNOSIS — S32591A Other specified fracture of right pubis, initial encounter for closed fracture: Principal | ICD-10-CM | POA: Diagnosis present

## 2018-01-15 DIAGNOSIS — Z90710 Acquired absence of both cervix and uterus: Secondary | ICD-10-CM

## 2018-01-15 DIAGNOSIS — M79605 Pain in left leg: Secondary | ICD-10-CM | POA: Diagnosis not present

## 2018-01-15 DIAGNOSIS — Z86718 Personal history of other venous thrombosis and embolism: Secondary | ICD-10-CM | POA: Diagnosis not present

## 2018-01-15 DIAGNOSIS — S299XXA Unspecified injury of thorax, initial encounter: Secondary | ICD-10-CM | POA: Diagnosis not present

## 2018-01-15 DIAGNOSIS — F17210 Nicotine dependence, cigarettes, uncomplicated: Secondary | ICD-10-CM | POA: Diagnosis present

## 2018-01-15 DIAGNOSIS — E785 Hyperlipidemia, unspecified: Secondary | ICD-10-CM | POA: Diagnosis present

## 2018-01-15 DIAGNOSIS — R410 Disorientation, unspecified: Secondary | ICD-10-CM | POA: Diagnosis present

## 2018-01-15 DIAGNOSIS — M549 Dorsalgia, unspecified: Secondary | ICD-10-CM

## 2018-01-15 DIAGNOSIS — Z803 Family history of malignant neoplasm of breast: Secondary | ICD-10-CM

## 2018-01-15 DIAGNOSIS — K589 Irritable bowel syndrome without diarrhea: Secondary | ICD-10-CM | POA: Diagnosis present

## 2018-01-15 DIAGNOSIS — S0003XA Contusion of scalp, initial encounter: Secondary | ICD-10-CM | POA: Diagnosis present

## 2018-01-15 DIAGNOSIS — S82141A Displaced bicondylar fracture of right tibia, initial encounter for closed fracture: Secondary | ICD-10-CM | POA: Diagnosis present

## 2018-01-15 DIAGNOSIS — R4782 Fluency disorder in conditions classified elsewhere: Secondary | ICD-10-CM | POA: Diagnosis present

## 2018-01-15 DIAGNOSIS — Z888 Allergy status to other drugs, medicaments and biological substances status: Secondary | ICD-10-CM

## 2018-01-15 DIAGNOSIS — Z7951 Long term (current) use of inhaled steroids: Secondary | ICD-10-CM

## 2018-01-15 DIAGNOSIS — N2581 Secondary hyperparathyroidism of renal origin: Secondary | ICD-10-CM | POA: Diagnosis not present

## 2018-01-15 DIAGNOSIS — G8929 Other chronic pain: Secondary | ICD-10-CM | POA: Diagnosis present

## 2018-01-15 DIAGNOSIS — Y92009 Unspecified place in unspecified non-institutional (private) residence as the place of occurrence of the external cause: Secondary | ICD-10-CM

## 2018-01-15 DIAGNOSIS — G92 Toxic encephalopathy: Secondary | ICD-10-CM | POA: Diagnosis not present

## 2018-01-15 DIAGNOSIS — I5022 Chronic systolic (congestive) heart failure: Secondary | ICD-10-CM | POA: Diagnosis not present

## 2018-01-15 DIAGNOSIS — F419 Anxiety disorder, unspecified: Secondary | ICD-10-CM | POA: Diagnosis present

## 2018-01-15 DIAGNOSIS — M1990 Unspecified osteoarthritis, unspecified site: Secondary | ICD-10-CM | POA: Diagnosis present

## 2018-01-15 DIAGNOSIS — Z638 Other specified problems related to primary support group: Secondary | ICD-10-CM

## 2018-01-15 DIAGNOSIS — I132 Hypertensive heart and chronic kidney disease with heart failure and with stage 5 chronic kidney disease, or end stage renal disease: Secondary | ICD-10-CM | POA: Diagnosis present

## 2018-01-15 DIAGNOSIS — S0990XA Unspecified injury of head, initial encounter: Secondary | ICD-10-CM | POA: Diagnosis not present

## 2018-01-15 DIAGNOSIS — Z7901 Long term (current) use of anticoagulants: Secondary | ICD-10-CM

## 2018-01-15 DIAGNOSIS — K5900 Constipation, unspecified: Secondary | ICD-10-CM | POA: Diagnosis present

## 2018-01-15 DIAGNOSIS — R011 Cardiac murmur, unspecified: Secondary | ICD-10-CM | POA: Diagnosis present

## 2018-01-15 DIAGNOSIS — E7800 Pure hypercholesterolemia, unspecified: Secondary | ICD-10-CM | POA: Diagnosis present

## 2018-01-15 DIAGNOSIS — Z880 Allergy status to penicillin: Secondary | ICD-10-CM

## 2018-01-15 DIAGNOSIS — Z9049 Acquired absence of other specified parts of digestive tract: Secondary | ICD-10-CM

## 2018-01-15 DIAGNOSIS — Z882 Allergy status to sulfonamides status: Secondary | ICD-10-CM

## 2018-01-15 DIAGNOSIS — S199XXA Unspecified injury of neck, initial encounter: Secondary | ICD-10-CM | POA: Diagnosis not present

## 2018-01-15 DIAGNOSIS — Z885 Allergy status to narcotic agent status: Secondary | ICD-10-CM

## 2018-01-15 DIAGNOSIS — Z841 Family history of disorders of kidney and ureter: Secondary | ICD-10-CM

## 2018-01-15 DIAGNOSIS — F329 Major depressive disorder, single episode, unspecified: Secondary | ICD-10-CM | POA: Diagnosis present

## 2018-01-15 DIAGNOSIS — K219 Gastro-esophageal reflux disease without esophagitis: Secondary | ICD-10-CM | POA: Diagnosis present

## 2018-01-15 DIAGNOSIS — I739 Peripheral vascular disease, unspecified: Secondary | ICD-10-CM | POA: Diagnosis present

## 2018-01-15 DIAGNOSIS — M79604 Pain in right leg: Secondary | ICD-10-CM | POA: Diagnosis not present

## 2018-01-15 DIAGNOSIS — J449 Chronic obstructive pulmonary disease, unspecified: Secondary | ICD-10-CM | POA: Diagnosis present

## 2018-01-15 DIAGNOSIS — D72829 Elevated white blood cell count, unspecified: Secondary | ICD-10-CM | POA: Diagnosis present

## 2018-01-15 DIAGNOSIS — R296 Repeated falls: Secondary | ICD-10-CM | POA: Diagnosis present

## 2018-01-15 DIAGNOSIS — Z66 Do not resuscitate: Secondary | ICD-10-CM | POA: Diagnosis present

## 2018-01-15 DIAGNOSIS — Z95820 Peripheral vascular angioplasty status with implants and grafts: Secondary | ICD-10-CM

## 2018-01-15 DIAGNOSIS — Z6832 Body mass index (BMI) 32.0-32.9, adult: Secondary | ICD-10-CM

## 2018-01-15 DIAGNOSIS — M109 Gout, unspecified: Secondary | ICD-10-CM | POA: Diagnosis present

## 2018-01-15 DIAGNOSIS — M48061 Spinal stenosis, lumbar region without neurogenic claudication: Secondary | ICD-10-CM | POA: Diagnosis present

## 2018-01-15 DIAGNOSIS — Z808 Family history of malignant neoplasm of other organs or systems: Secondary | ICD-10-CM

## 2018-01-15 DIAGNOSIS — S3993XA Unspecified injury of pelvis, initial encounter: Secondary | ICD-10-CM | POA: Diagnosis not present

## 2018-01-15 LAB — COMPREHENSIVE METABOLIC PANEL
ALT: 15 U/L (ref 0–44)
AST: 21 U/L (ref 15–41)
Albumin: 3.5 g/dL (ref 3.5–5.0)
Alkaline Phosphatase: 105 U/L (ref 38–126)
Anion gap: 8 (ref 5–15)
BUN: 22 mg/dL (ref 8–23)
CO2: 28 mmol/L (ref 22–32)
Calcium: 9.5 mg/dL (ref 8.9–10.3)
Chloride: 101 mmol/L (ref 98–111)
Creatinine, Ser: 1.34 mg/dL — ABNORMAL HIGH (ref 0.44–1.00)
GFR calc Af Amer: 43 mL/min — ABNORMAL LOW (ref >60)
GFR calc non Af Amer: 37 mL/min — ABNORMAL LOW (ref >60)
Glucose, Bld: 120 mg/dL — ABNORMAL HIGH (ref 70–99)
Potassium: 4.5 mmol/L (ref 3.5–5.1)
Sodium: 137 mmol/L (ref 135–145)
Total Bilirubin: 0.4 mg/dL (ref 0.3–1.2)
Total Protein: 7.8 g/dL (ref 6.5–8.1)

## 2018-01-15 LAB — CBC WITH DIFFERENTIAL/PLATELET
Abs Immature Granulocytes: 0 10*3/uL (ref 0.0–0.1)
BASOS PCT: 1 %
Basophils Absolute: 0 10*3/uL (ref 0.0–0.1)
EOS ABS: 0.4 10*3/uL (ref 0.0–0.7)
EOS PCT: 5 %
HCT: 37.8 % (ref 36.0–46.0)
Hemoglobin: 11 g/dL — ABNORMAL LOW (ref 12.0–15.0)
Immature Granulocytes: 1 %
Lymphocytes Relative: 31 %
Lymphs Abs: 2.6 10*3/uL (ref 0.7–4.0)
MCH: 26.4 pg (ref 26.0–34.0)
MCHC: 29.1 g/dL — ABNORMAL LOW (ref 30.0–36.0)
MCV: 90.6 fL (ref 78.0–100.0)
MONO ABS: 0.9 10*3/uL (ref 0.1–1.0)
MONOS PCT: 11 %
NEUTROS PCT: 53 %
Neutro Abs: 4.5 10*3/uL (ref 1.7–7.7)
PLATELETS: 286 10*3/uL (ref 150–400)
RBC: 4.17 MIL/uL (ref 3.87–5.11)
RDW: 13.1 % (ref 11.5–15.5)
WBC: 8.5 10*3/uL (ref 4.0–10.5)

## 2018-01-15 LAB — PROTIME-INR
INR: 1.06
PROTHROMBIN TIME: 13.7 s (ref 11.4–15.2)

## 2018-01-15 LAB — I-STAT TROPONIN, ED: Troponin i, poc: 0 ng/mL (ref 0.00–0.08)

## 2018-01-15 LAB — APTT: aPTT: 31 seconds (ref 24–36)

## 2018-01-15 MED ORDER — ALLOPURINOL 100 MG PO TABS
100.0000 mg | ORAL_TABLET | ORAL | Status: DC
  Administered 2018-01-17 – 2018-01-21 (×3): 100 mg via ORAL
  Filled 2018-01-15 (×4): qty 1

## 2018-01-15 MED ORDER — PREGABALIN 100 MG PO CAPS
100.0000 mg | ORAL_CAPSULE | Freq: Every day | ORAL | Status: DC
  Administered 2018-01-16 – 2018-01-22 (×7): 100 mg via ORAL
  Filled 2018-01-15 (×7): qty 1

## 2018-01-15 MED ORDER — SEVELAMER CARBONATE 800 MG PO TABS
2400.0000 mg | ORAL_TABLET | ORAL | Status: DC | PRN
  Filled 2018-01-15: qty 3

## 2018-01-15 MED ORDER — LOSARTAN POTASSIUM 50 MG PO TABS: 100.0000 mg | ORAL_TABLET | Freq: Every day | ORAL | Status: DC

## 2018-01-15 MED ORDER — ACETAMINOPHEN 650 MG RE SUPP: 650.0000 mg | Freq: Four times a day (QID) | RECTAL | Status: DC | PRN

## 2018-01-15 MED ORDER — DULOXETINE HCL 60 MG PO CPEP
60.0000 mg | ORAL_CAPSULE | Freq: Every day | ORAL | Status: DC
  Administered 2018-01-16 – 2018-01-22 (×7): 60 mg via ORAL
  Filled 2018-01-15 (×7): qty 1

## 2018-01-15 MED ORDER — METOPROLOL SUCCINATE ER 100 MG PO TB24: 100.0000 mg | ORAL_TABLET | Freq: Two times a day (BID) | ORAL | Status: DC

## 2018-01-15 MED ORDER — ATORVASTATIN CALCIUM 40 MG PO TABS
40.0000 mg | ORAL_TABLET | Freq: Every day | ORAL | Status: DC
  Administered 2018-01-16 – 2018-01-22 (×7): 40 mg via ORAL
  Filled 2018-01-15 (×7): qty 1

## 2018-01-15 MED ORDER — ACETAMINOPHEN 325 MG PO TABS
650.0000 mg | ORAL_TABLET | Freq: Four times a day (QID) | ORAL | Status: DC | PRN
  Administered 2018-01-16: 650 mg via ORAL

## 2018-01-15 MED ORDER — RENA-VITE PO TABS
1.0000 | ORAL_TABLET | Freq: Every day | ORAL | Status: DC
  Administered 2018-01-16 – 2018-01-22 (×4): 1 via ORAL
  Filled 2018-01-15 (×4): qty 1

## 2018-01-15 MED ORDER — DIALYVITE/ZINC PO TABS: 1.0000 | ORAL_TABLET | Freq: Every day | ORAL | Status: DC

## 2018-01-15 MED ORDER — PREDNISONE 20 MG PO TABS
40.0000 mg | ORAL_TABLET | Freq: Every day | ORAL | Status: DC
  Filled 2018-01-15: qty 2

## 2018-01-15 MED ORDER — CINACALCET HCL 30 MG PO TABS
90.0000 mg | ORAL_TABLET | Freq: Every day | ORAL | Status: DC
  Administered 2018-01-16 – 2018-01-22 (×7): 90 mg via ORAL
  Filled 2018-01-15 (×8): qty 3

## 2018-01-15 MED ORDER — LORATADINE 10 MG PO TABS: 10.0000 mg | ORAL_TABLET | Freq: Every day | ORAL | Status: DC | PRN

## 2018-01-15 MED ORDER — SENNOSIDES-DOCUSATE SODIUM 8.6-50 MG PO TABS
1.0000 | ORAL_TABLET | Freq: Two times a day (BID) | ORAL | Status: DC
  Administered 2018-01-16 – 2018-01-22 (×5): 1 via ORAL
  Filled 2018-01-15 (×10): qty 1

## 2018-01-15 MED ORDER — FLUTICASONE PROPIONATE 50 MCG/ACT NA SUSP
2.0000 | Freq: Every day | NASAL | Status: DC | PRN
Start: 2018-01-15 — End: 2018-01-23
  Filled 2018-01-15: qty 16

## 2018-01-15 MED ORDER — MOMETASONE FURO-FORMOTEROL FUM 200-5 MCG/ACT IN AERO
2.0000 | INHALATION_SPRAY | Freq: Two times a day (BID) | RESPIRATORY_TRACT | Status: DC
  Administered 2018-01-18 – 2018-01-22 (×4): 2 via RESPIRATORY_TRACT
  Filled 2018-01-15 (×2): qty 8.8

## 2018-01-15 MED ORDER — AMLODIPINE BESYLATE 10 MG PO TABS
10.0000 mg | ORAL_TABLET | Freq: Every day | ORAL | Status: DC
  Administered 2018-01-16: 10 mg via ORAL
  Filled 2018-01-15: qty 1

## 2018-01-15 MED ORDER — MORPHINE SULFATE (PF) 4 MG/ML IV SOLN
4.0000 mg | Freq: Once | INTRAVENOUS | Status: AC
  Administered 2018-01-15: 4 mg via INTRAVENOUS
  Filled 2018-01-15: qty 1

## 2018-01-15 MED ORDER — SEVELAMER CARBONATE 800 MG PO TABS
2400.0000 mg | ORAL_TABLET | Freq: Three times a day (TID) | ORAL | Status: DC
  Administered 2018-01-16 – 2018-01-22 (×13): 2400 mg via ORAL
  Filled 2018-01-15 (×16): qty 3

## 2018-01-15 MED ORDER — ALBUTEROL SULFATE (2.5 MG/3ML) 0.083% IN NEBU
2.5000 mg | INHALATION_SOLUTION | RESPIRATORY_TRACT | Status: DC | PRN
  Administered 2018-01-17: 2.5 mg via RESPIRATORY_TRACT
  Filled 2018-01-15: qty 3

## 2018-01-15 MED ORDER — MORPHINE SULFATE (PF) 2 MG/ML IV SOLN
0.5000 mg | INTRAVENOUS | Status: DC | PRN
  Administered 2018-01-16 – 2018-01-17 (×7): 0.5 mg via INTRAVENOUS
  Filled 2018-01-15 (×6): qty 1

## 2018-01-15 NOTE — ED Notes (Signed)
MD Long notified about pt jittery movements and being stuck on the same phrases. She is able to answer orientation questions appropriately.  Per MD Long, continue to monitor and will reassess after CT scan.

## 2018-01-15 NOTE — ED Notes (Addendum)
Unable to obtain labs at this time due to pt being a hard stick. IV team consulted. Md Long notified.

## 2018-01-15 NOTE — ED Triage Notes (Signed)
Pt arriving from home via [facility] after falling 2 x today. Pt is on Cooumadin.  Pt reports having difficulty getting her words out.  Right knee is a 10/10 pain.  PERRLA.  Pt missed dialysis today. Pt was given 1/2 an oxycodone at home for pain.  Pt A&O x4.

## 2018-01-15 NOTE — ED Notes (Signed)
Pt sleeping, will attempt swallow screen at a later time.

## 2018-01-15 NOTE — ED Notes (Signed)
Nurse sent down blood for etoh blood test, she will call main lab to see if in process.

## 2018-01-15 NOTE — ED Notes (Signed)
Unable to obtain a urine sample. Per pt and daughter, pt does not make urine.

## 2018-01-15 NOTE — ED Provider Notes (Signed)
Emergency Department Provider Note   I have reviewed the triage vital signs and the nursing notes.   HISTORY  Chief Complaint Fall and Aphasia   HPI [name] is a 77 y.o. female with PMH of CHF, CKD, HLD, HTN, TIA, PAD with right femoral artery stenting on 12/17/17 with Dr. Bunnie Domino to the emergency department for evaluation of severe right leg pain after multiple falls at home.  Patient returned home 2 days ago after discharge following HCAP.  She has apparently been compliant with antibiotics at home.  According to EMS the patient had a fall yesterday which resulted in severe right leg pain and right knee swelling.  He does have a known DVT in the right leg and is on Coumadin.  Patient tells me that she hit her head but cannot describe the events leading up to the fall.  According to EMS the family states that she has fallen 2 other times.  They gave 1/2 tablet of hydrocodone today with no relief so EMS was called.  Family also reports some speech changes starting yesterday.  Patient expresses that she is having difficulty getting out her words.    Past Medical History:  Diagnosis Date  . Abnormality of gait 03/28/2015  . Adrenal mass ([place])   . 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 Arthritis   . Back pain   . CHF (congestive heart failure) ([place])   . Chronic kidney disease    Hemo MWF  . Congestion of throat    Pt states she has a lot mucus in back throat.  . Constipation   . COPD (chronic obstructive pulmonary disease) ([place])   . Depression   . Dialysis patient [facility])    kidney  . Diverticulitis   . GERD (gastroesophageal reflux disease)   . H/O hiatal hernia   . High cholesterol   . Hoarseness of voice   . Hyperlipidemia   . Hypertension   . IBS (irritable bowel syndrome)   . Meralgia paresthetica of right side 12/26/2014  . Normal cardiac stress test 12/24/2009   lexiscan, imaging normal  . PAD (peripheral artery disease) ([place])  12/21/2017  . Pneumonia    [id number]  . Renal disorder   . RLS (restless legs syndrome)   . Seizures ([place])    2004 past brain surgery  . Sinus complaint   . Stroke ([place])    TIAs per patient 2 or 3  . Thyroid disease   . TIA (transient ischemic attack)   . Tubular adenoma of colon 01/2008    Patient Active Problem List   Diagnosis Date Noted  . History of DVT of lower extremity 01/12/2018  . Prolonged QT interval 01/12/2018  . Aprill Banko term (current) use of anticoagulants 01/04/2018  . Right leg pain 12/21/2017  . PAD (peripheral artery disease) ([place]) 12/21/2017  . Leukocytosis 12/21/2017  . Hyperlipidemia 12/01/2017  . Atherosclerosis of native arteries of extremity with rest pain ([place]) 12/01/2017  . LLQ abdominal pain 08/10/2017  . Left bundle branch block 07/24/2017  . Chest pain 07/24/2017  . Weakness 12/23/2016  . Generalized weakness 12/22/2016  . HCAP (healthcare-associated pneumonia) 12/06/2016  . Hypokalemia 12/06/2016  . Chronic systolic CHF (congestive heart failure) ([place]) 12/06/2016  . Osteopenia 09/08/2016  . Allergic rhinitis 08/07/2016  . BMI 32.0-32.9,adult 08/07/2016  . Onychomycosis 05/09/2016  . Nonspecific chest pain 04/19/2016  . Leg pain, bilateral 12/05/2015  . Trouble in sleeping  11/22/2015  . End stage renal disease ([place])   . Acute on chronic respiratory failure with hypoxia ([place]) 11/12/2015  . ESRD on dialysis ([place])   . Chronic obstructive pulmonary disease ([place])   . Hypertensive urgency 09/20/2015  . Fecal incontinence   . Adverse effects of medication 06/21/2015  . Herpes 06/19/2015  . Skin lesion 06/16/2015  . Recurrent genital herpes 06/16/2015  . COPD exacerbation ([place]) 06/05/2015  . Onychocryptosis 04/08/2015  . Unstable gait 03/28/2015  . Renal mass   . Hypertension   . Inadequate pain control 12/30/2014  . CHF (congestive heart failure) ([place]) 12/30/2014  . Meralgia paresthetica of right side 12/26/2014  . Hereditary and idiopathic  peripheral neuropathy 02/15/2013  . Dermatitis of face 08/19/2012  . HIP PAIN, BILATERAL 12/07/2008  . BLADDER PROLAPSE 11/17/2007  . OSTEOARTHRITIS, GENERALIZED, MULTIPLE JOINTS 08/18/2007  . 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 Anxiety and depression 04/23/2007  . Anemia of chronic disease 03/31/2007  . TOBACCO ABUSE 12/10/2006  . HIATAL HERNIA 12/10/2006  . Dysphagia 12/10/2006  . HYPERCHOLESTEROLEMIA 07/30/2006  . Gout, unspecified 07/30/2006  . Major depression in partial remission ([place]) 07/30/2006  . Hypertensive renal disease, malignant, with renal failure 07/30/2006  . GASTROESOPHAGEAL REFLUX, NO ESOPHAGITIS 07/30/2006    Past Surgical History:  Procedure Laterality Date  . A/V SHUNT INTERVENTION N/A 08/13/2017   Procedure: A/V SHUNT INTERVENTION;  Surgeon: Algernon Huxley, MD;  Location: [place] [facility];  Service: Cardiovascular;  Laterality: N/A;  . A/V SHUNTOGRAM Left 08/13/2017   Procedure: A/V SHUNTOGRAM;  Surgeon: Algernon Huxley, MD;  Location: [place] [facility];  Service: Cardiovascular;  Laterality: Left;  . ABDOMINAL HYSTERECTOMY    . ANAL RECTAL MANOMETRY N/A 07/25/2015   Procedure: ANO RECTAL MANOMETRY;  Surgeon: Mauri Pole, MD;  Location: [facility];  Service: Endoscopy;  Laterality: N/A;  . APPENDECTOMY    . AV FISTULA PLACEMENT Left 11/11/2012   Procedure: INSERTION OF ARTERIOVENOUS (AV) GORE-TEX GRAFT ARM;  Surgeon: Angelia Mould, MD;  Location: [place];  Service: Vascular;  Laterality: Left;  . [place] REMOVAL Left 11/18/2012   Procedure: REMOVAL OF LEFT UPPER ARM ARTERIOVENOUS GORETEX GRAFT ([place]);  Surgeon: Angelia Mould, MD;  Location: [place];  Service: Vascular;  Laterality: Left;  . CARDIAC CATHETERIZATION  2003   normal  . CHOLECYSTECTOMY     Open mid line incision  . frontal craniotomy  2002   indication = sinusitis  . INSERTION OF DIALYSIS CATHETER     Left  . LOWER EXTREMITY ANGIOGRAPHY Right 12/17/2017    Procedure: LOWER EXTREMITY ANGIOGRAPHY;  Surgeon: Algernon Huxley, MD;  Location: [place] [facility];  Service: Cardiovascular;  Laterality: Right;  . NASAL SINUS SURGERY Bilateral 2002  . PATCH ANGIOPLASTY Left 11/18/2012   Procedure: PATCH ANGIOPLASTY;  Surgeon: Angelia Mould, MD;  Location: [place];  Service: Vascular;  Laterality: Left;  . PERIPHERAL VASCULAR CATHETERIZATION Left 03/08/2015   Procedure: A/V Shuntogram/Fistulagram;  Surgeon: Algernon Huxley, MD;  Location: [place] [facility];  Service: Cardiovascular;  Laterality: Left;  . PERIPHERAL VASCULAR CATHETERIZATION Left 03/08/2015   Procedure: A/V Shunt Intervention;  Surgeon: Algernon Huxley, MD;  Location: [place] [facility];  Service: Cardiovascular;  Laterality: Left;  . RECTAL ULTRASOUND N/A 10/05/2015   Procedure: ANAL ULTRASOUND WITH PROBE;  Surgeon: Leighton Ruff, MD;  Location[facility];  Service: Endoscopy;  Laterality: N/A;  . REVISON OF ARTERIOVENOUS FISTULA  05/11/2012   Procedure:  REVISON OF ARTERIOVENOUS FISTULA;  Surgeon: Elam Dutch, MD;  Location: [place];  Service: Vascular;  Laterality: Right;  . TUBAL LIGATION      Allergies Tuberculin tests; Valacyclovir; Codeine; Penicillins; and Sulfamethoxazole  Family History  Problem Relation Age of Onset  . Thyroid cancer Mother   . Heart disease Father   . Hypertension Father   . Heart attack Father   . Lupus Daughter   . Breast cancer Sister   . Thyroid cancer Sister   . Kidney disease Sister   . Colon cancer Neg Hx     Social History Social History   Tobacco Use  . Smoking status: Current Every Day Smoker    Packs/day: 1.00    Years: 60.00    Pack years: 60.00    Types: Cigarettes  . Smokeless tobacco: Never Used  . Tobacco comment: smoker since 77 yo.  1.5 ppd of [place] 100 lights. ; form given 10-23-16  Substance Use Topics  . Alcohol use: No    Alcohol/week: 0.0 standard drinks  . Drug use: No    Comment: No hx of IV drug use    Review  of Systems  Constitutional: No fever/chills Eyes: No visual changes. ENT: No sore throat. Cardiovascular: Denies chest pain. Respiratory: Denies shortness of breath. Gastrointestinal: No abdominal pain.  No nausea, no vomiting.  No diarrhea.  No constipation. Genitourinary: Negative for dysuria. Musculoskeletal: Negative for back pain. Positive right leg/hip pain.  Skin: Negative for rash. Neurological: Negative for headaches, focal weakness or numbness.  10-point ROS otherwise negative.  ____________________________________________   PHYSICAL EXAM:  VITAL SIGNS: ED Triage Vitals  Enc Vitals Group     BP 01/15/18 1557 (!) 205/68     Pulse Rate 01/15/18 1557 79     Resp 01/15/18 1557 18     Temp 01/15/18 1557 98.2 F (36.8 C)     Temp Source 01/15/18 1557 Oral     SpO2 01/15/18 1557 100 %     Weight 01/15/18 1556 151 lb (68.5 kg)     Height 01/15/18 1556 5\' 1"  (1.549 m)     Pain Score 01/15/18 1556 10   Constitutional: Alert but appears uncomfortable.  Eyes: Conjunctivae are normal. PERRL.  Head: Atraumatic. Nose: No congestion/rhinnorhea. Mouth/Throat: Mucous membranes are dry. Oropharynx non-erythematous. Neck: No stridor.  Cardiovascular: Normal rate, regular rhythm. Good peripheral circulation. Grossly normal heart sounds.   Respiratory: Normal respiratory effort.  No retractions. Lungs CTAB. Gastrointestinal: Soft and nontender. No distention.  Musculoskeletal: Significant tenderness with ROM of the right leg. Right leg is shortened and externally rotated. Palpable DP pulse and dopplerable PT pulse.  Neurologic: Hesitant speech at time. No gross focal neurologic deficits are appreciated.  Skin:  Skin is warm, dry and intact. No rash noted.  ____________________________________________   LABS (all labs ordered are listed, but only abnormal results are displayed)  Labs Reviewed  COMPREHENSIVE METABOLIC PANEL - Abnormal; Notable for the following components:       Result Value   Glucose, Bld 120 (*)    Creatinine, Ser 1.34 (*)    GFR calc non Af Amer 37 (*)    GFR calc Af Amer 43 (*)    All other components within normal limits  CBC WITH DIFFERENTIAL/PLATELET - Abnormal; Notable for the following components:   Hemoglobin 11.0 (*)    MCHC 29.1 (*)    All other components within normal limits  PROTIME-INR  APTT  ETHANOL  CBC  DIFFERENTIAL  RAPID URINE DRUG SCREEN, HOSP PERFORMED  URINALYSIS, ROUTINE W REFLEX MICROSCOPIC  I-STAT TROPONIN, ED   ____________________________________________  EKG   EKG Interpretation  Date/Time:  Friday January 15 2018 16:10:51 EDT Ventricular Rate:  76 PR Interval:    QRS Duration: 99 QT Interval:  447 QTC Calculation: 503 R Axis:   42 Text Interpretation:  Sinus rhythm Prolonged QT interval No STEMI.  Confirmed by Nanda Quinton [phone]) on 01/15/2018 5:36:32 PM       ____________________________________________  RADIOLOGY  Dg Chest 2 View  Result Date: 01/15/2018 CLINICAL DATA:  Fall leg pain EXAM: CHEST - 2 VIEW COMPARISON:  01/12/2018, 12/21/2017, 08/20/2017 FINDINGS: Left-sided central venous catheter tip projects over the right atrium. Right mid lung opacity has resolved. Chronic elevation of right diaphragm. Low lung volume. Mildly enlarged cardiomediastinal silhouette with aortic atherosclerosis. No pneumothorax. IMPRESSION: No active cardiopulmonary disease.  Cardiomegaly. Electronically Signed   By: Donavan Foil M.D.   On: 01/15/2018 18:36   Dg Knee 2 Views Right  Result Date: 01/15/2018 CLINICAL DATA:  Fall with knee pain EXAM: RIGHT KNEE - 1-2 VIEW COMPARISON:  10/05/2003 FINDINGS: Stent in the mid to distal thigh. No fracture or malalignment. Small knee effusion. Mild degenerative change of the medial compartment. IMPRESSION: No acute osseous abnormality.  Small knee effusion. Electronically Signed   By: Donavan Foil M.D.   On: 01/15/2018 18:39   Ct Head Wo Contrast  Result Date:  01/15/2018 CLINICAL DATA:  77 y/o F; 2 falls today. Word-finding difficulty. Right knee pain. Missed dialysis today. EXAM: CT HEAD WITHOUT CONTRAST CT CERVICAL SPINE WITHOUT CONTRAST TECHNIQUE: Multidetector CT imaging of the head and cervical spine was performed following the standard protocol without intravenous contrast. Multiplanar CT image reconstructions of the cervical spine were also generated. COMPARISON:  12/25/2017 CT head. FINDINGS: CT HEAD FINDINGS Brain: No evidence of acute infarction, hemorrhage, hydrocephalus, extra-axial collection or mass lesion/mass effect. Left anterior frontal stable encephalomalacia. Empty sella turcica. Vascular: Calcific atherosclerosis of carotid siphons. No hyperdense vessel. Skull: Left parietal scalp contusion. No calvarial fracture. Chronic postsurgical changes related to bifrontal craniotomy. Sinuses/Orbits: Left-sided ethmoidectomy and maxillary antrostomy postsurgical changes. Mild mucosal thickening of the paranasal sinuses. Normal aeration of the mastoid air cells. Bilateral intra-ocular lens replacement. Other: None. CT CERVICAL SPINE FINDINGS Alignment: C4-5 and C5-6 grade 1 anterolisthesis. Skull base and vertebrae: No acute fracture. No primary bone lesion or focal pathologic process. Soft tissues and spinal canal: No prevertebral fluid or swelling. No visible canal hematoma. 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 Cervical spondylosis with mild multilevel disc and moderate facet arthrosis. No high-grade bony canal stenosis. Upper chest: Left subclavian stent partially visualized. Calcific atherosclerosis of the carotid bifurcations and the aorta. Other: 10 mm nodule in the left lobe of the thyroid. IMPRESSION: 1. Left parietal scalp contusion. 2. No calvarial fracture or acute intracranial abnormality. 3. No acute fracture or dislocation of the cervical spine. 4. Stable left anterior frontal lobe  encephalomalacia and empty sella turcica. 5. Mild paranasal sinus mucosal thickening. 6. 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 Electronically Signed   By: Kristine Garbe M.D.   On: 01/15/2018 19:08   Ct Cervical Spine Wo Contrast  Result Date: 01/15/2018 CLINICAL DATA:  77 y/o F; 2 falls today. Word-finding difficulty. Right knee pain. Missed dialysis today. EXAM: CT HEAD WITHOUT CONTRAST CT CERVICAL SPINE WITHOUT CONTRAST TECHNIQUE: Multidetector CT imaging of the head  and cervical spine was performed following the standard protocol without intravenous contrast. Multiplanar CT image reconstructions of the cervical spine were also generated. COMPARISON:  12/25/2017 CT head. FINDINGS: CT HEAD FINDINGS Brain: No evidence of acute infarction, hemorrhage, hydrocephalus, extra-axial collection or mass lesion/mass effect. Left anterior frontal stable encephalomalacia. Empty sella turcica. Vascular: Calcific atherosclerosis of carotid siphons. No hyperdense vessel. Skull: Left parietal scalp contusion. No calvarial fracture. Chronic postsurgical changes related to bifrontal craniotomy. Sinuses/Orbits: Left-sided ethmoidectomy and maxillary antrostomy postsurgical changes. Mild mucosal thickening of the paranasal sinuses. Normal aeration of the mastoid air cells. Bilateral intra-ocular lens replacement. Other: None. CT CERVICAL SPINE FINDINGS Alignment: C4-5 and C5-6 grade 1 anterolisthesis. Skull base and vertebrae: No acute fracture. No primary bone lesion or focal pathologic process. Soft tissues and spinal canal: No prevertebral fluid or swelling. No visible canal hematoma. 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 Cervical spondylosis with mild multilevel disc and moderate facet arthrosis. No high-grade bony canal stenosis. Upper chest: Left subclavian stent partially visualized. Calcific  atherosclerosis of the carotid bifurcations and the aorta. Other: 10 mm nodule in the left lobe of the thyroid. IMPRESSION: 1. Left parietal scalp contusion. 2. No calvarial fracture or acute intracranial abnormality. 3. No acute fracture or dislocation of the cervical spine. 4. Stable left anterior frontal lobe encephalomalacia and empty sella turcica. 5. Mild paranasal sinus mucosal thickening. 6. 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 Electronically Signed   By: Kristine Garbe M.D.   On: 01/15/2018 19:08   Dg Hips Bilat W Or Wo Pelvis 3-4 Views  Result Date: 01/15/2018 CLINICAL DATA:  Fall with bilateral leg pain EXAM: DG HIP (WITH OR WITHOUT PELVIS) 3-4V BILAT COMPARISON:  CT 08/20/2017 FINDINGS: SI joints are non widened. The pubic symphysis is intact. Possible acute minimally displaced fracture involving the right inferior pubic ramus. Both femoral heads project in joint. Vascular calcifications.  Stent in the right thigh. IMPRESSION: Possible acute minimally displaced fracture involving the right inferior pubic ramus. Otherwise no acute osseous abnormality is seen. Electronically Signed   By: Donavan Foil M.D.   On: 01/15/2018 18:38    ____________________________________________   PROCEDURES  Procedure(s) performed:   Procedures  None ____________________________________________   INITIAL IMPRESSION / ASSESSMENT AND PLAN / ED COURSE  Pertinent labs & imaging results that were available during my care of the patient were reviewed by me and considered in my medical decision making (see chart for details).  Patient presents to the ED with significant right leg pain after fall and questionable speech hesitancy. No other neuro deficits. Patient plain films show pubic ramus fracture. Normal hip and knee films. Normal CT head. Labs reviewed with no acute findings. No indication for emergency HD.   Spoke with Dr. Londell Moh  who agrees with MRI brain given speech changes. No need for full stroke w/u if MRI is negative. Patient will require admit for PT/OT and pain control with pubic ramus fracture. Spoke with Dr. Erlinda Hong who advises WBAT. No surgical intervention.   Discussed patient's case with Hospitalist, Dr. Hal Hope to request admission. Patient and family (if present) updated with plan. Care transferred to Hospitalist service.  I reviewed all nursing notes, vitals, pertinent old records, EKGs, labs, imaging (as available).  ____________________________________________  FINAL CLINICAL IMPRESSION(S) / ED DIAGNOSES  Final diagnoses:  Closed fracture of ramus of right pubis, initial encounter ([place])  Fall, initial encounter  Dysarthria  MEDICATIONS GIVEN DURING THIS VISIT:  Medications  morphine 4 MG/ML injection 4 mg (4 mg Intravenous Given 01/15/18 1723)    Note:  This document was prepared using Dragon voice recognition software and may include unintentional dictation errors.  Nanda Quinton, MD Emergency Medicine    Zinedine Ellner, [facility], MD 01/15/18 [phone]

## 2018-01-15 NOTE — Progress Notes (Signed)
ANTICOAGULATION CONSULT NOTE - Initial Consult  Pharmacy Consult for Coumadin Indication: h/o DVT  Allergies  Allergen Reactions  . Tuberculin Tests Hives    "blisters"  . Valacyclovir Other (See Comments)    Confusion and nervousness  . Codeine Nausea And Vomiting  . Penicillins Rash    No problems breathing. Has tolerated omnicef in past without issue Has patient had a PCN reaction causing immediate rash, facial/tongue/throat swelling, SOB or lightheadedness with hypotension: Yes Has patient had a PCN reaction causing severe rash involving mucus membranes or skin necrosis: No Has patient had a PCN reaction that required hospitalization No Has patient had a PCN reaction occurring within the last 10 years: No If all of the above answers are "NO", then may proceed with Cephalosporin  . Sulfamethoxazole Rash    Patient Measurements: Height: 5\' 1"  (154.9 cm) Weight: 151 lb (68.5 kg) IBW/kg (Calculated) : 47.8  Vital Signs: Temp: 98.5 F (36.9 C) (08/17 0021) Temp Source: Oral (08/17 0021) BP: 148/55 (08/17 0021) Pulse Rate: 63 (08/17 0021)  Labs: Recent Labs    01/13/18 0532  01/13/18 0824 01/15/18 1708 01/16/18 0014  HGB  --    < > 10.1* 11.0* 10.4*  HCT  --   --  32.9* 37.8 32.6*  PLT  --   --  189 286 143*  APTT  --   --   --  31  --   LABPROT 23.7*  --   --  13.7 25.6*  INR 2.13  --   --  1.06 2.36  CREATININE  --   --  6.51* 1.34*  --    < > = values in this interval not displayed.    Estimated Creatinine Clearance: 31.6 mL/min (A) (by C-G formula based on SCr of 1.34 mg/dL (H)).   Medical History: Past Medical History:  Diagnosis Date  . Abnormality of gait 03/28/2015  . Adrenal mass ([place])   . 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 Arthritis   . Back pain   . CHF (congestive heart failure) ([place])   . Chronic kidney disease    [place] MWF  . Congestion of throat    Pt states she has a lot mucus in back throat.  .  Constipation   . COPD (chronic obstructive pulmonary disease) ([place])   . Depression   . Dialysis patient [facility])    kidney  . Diverticulitis   . GERD (gastroesophageal reflux disease)   . H/O hiatal hernia   . High cholesterol   . Hoarseness of voice   . Hyperlipidemia   . Hypertension   . IBS (irritable bowel syndrome)   . Meralgia paresthetica of right side 12/26/2014  . Normal cardiac stress test 12/24/2009   lexiscan, imaging normal  . PAD (peripheral artery disease) ([place]) 12/21/2017  . Pneumonia    [id number]  . Renal disorder   . RLS (restless legs syndrome)   . Seizures ([place])    2004 past brain surgery  . Sinus complaint   . Stroke ([place])    TIAs per patient 2 or 3  . Thyroid disease   . TIA (transient ischemic attack)   . Tubular adenoma of colon 01/2008    Medications:  No current facility-administered medications on file prior to encounter.    Current Outpatient Medications on File Prior to Encounter  Medication Sig Dispense Refill  . albuterol (PROVENTIL HFA;VENTOLIN HFA) 108 (90 Base) MCG/ACT inhaler Inhale  2 puffs into the lungs every 4 (four) hours as needed for wheezing or shortness of breath. 1 Inhaler 1  . allopurinol (ZYLOPRIM) 100 MG tablet Take 100 mg by mouth once daily on non-dialysis days (Tues/Thurs/Sat/Sun) (Patient taking differently: Take 100 mg by mouth See admin instructions. Take one tablet (100 mg) by mouth once daily on non-dialysis days (Tues/Thurs/Sat/Sun)) 30 tablet 0  . amLODipine (NORVASC) 10 MG tablet TAKE 1 TABLET BY MOUTH AT BEDTIME (Patient taking differently: Take 10 mg by mouth at bedtime. ) 90 tablet 1  . atorvastatin (LIPITOR) 40 MG tablet Take 1 tablet (40 mg total) by mouth daily. (Patient taking differently: Take 40 mg by mouth at bedtime. ) 90 tablet 3  . azelastine (ASTELIN) 0.1 % nasal spray Place 1 spray into both nostrils 2 (two) times daily. Use in each nostril as directed (Patient taking differently: Place 1 spray into both nostrils  2 (two) times daily as needed for rhinitis or allergies. Use in each nostril as directed) 30 mL 6  . B Complex-C-Zn-Folic Acid (DIALYVITE/ZINC) TABS Take 1 tablet by mouth daily.  6  . cefUROXime (CEFTIN) 500 MG tablet Take 500 mg after HD days. (Patient taking differently: Take 500 mg by mouth See admin instructions. Start date 01/15/18: take one tablet (500 mg) by mouth after dialysis (Monday, Wednesday, Friday) for 4 doses) 4 tablet 0  . cinacalcet (SENSIPAR) 90 MG tablet Take 90 mg by mouth at bedtime.    . DULoxetine (CYMBALTA) 30 MG capsule Take 2 capsules (60 mg total) by mouth daily. (Patient taking differently: Take 60 mg by mouth at bedtime. ) 180 capsule 3  . fluticasone (FLONASE) 50 MCG/ACT nasal spray Place 2 sprays into both nostrils daily as needed for allergies or rhinitis. 16 g 3  . hydrOXYzine (ATARAX/VISTARIL) 25 MG tablet Take 1 tablet (25 mg total) by mouth every 8 (eight) hours as needed for itching. 30 tablet 1  . lidocaine-prilocaine (EMLA) cream Apply 1 application topically See admin instructions. Apply topically one hour before dialysis - Monday, Wednesday, Friday    . loratadine (CLARITIN) 10 MG tablet 10 mg every other day. (Patient taking differently: Take 10 mg by mouth daily as needed for allergies. 10 mg every other day.) 45 tablet 1  . losartan (COZAAR) 100 MG tablet Take 1 tablet (100 mg total) by mouth daily. 90 tablet 1  . metoprolol succinate (TOPROL-XL) 100 MG 24 hr tablet TAKE 1 TABLET (100 MG)  BY MOUTH TWICE DAILY (Patient taking differently: Take 100 mg by mouth 2 (two) times daily. ) 180 tablet 3  . multivitamin (RENA-VIT) TABS tablet Take 1 tablet by mouth at bedtime. (Patient taking differently: Take 1 tablet by mouth daily. ) 60 tablet 5  . oxyCODONE-acetaminophen (PERCOCET) 5-325 MG tablet Take 1 tablet by mouth every 8 (eight) hours as needed for severe pain. (Patient taking differently: Take 0.5-1 tablets by mouth every 8 (eight) hours as needed for severe  pain. ) 45 tablet 0  . polyethylene glycol powder (MIRALAX) powder Take 17 g by mouth daily. 255 g 0  . predniSONE (DELTASONE) 20 MG tablet Take 2 tablets (40 mg total) by mouth daily with breakfast. 4 tablet 0  . pregabalin (LYRICA) 100 MG capsule Take 100 mg by mouth at bedtime.     . sevelamer carbonate (RENVELA) 800 MG tablet Take 2,400 mg by mouth See admin instructions. Take 3 tablets (2400 mg) by mouth with each meal or snack - once or twice daily    .  warfarin (COUMADIN) 5 MG tablet Take 5 mg Monday Wednesday Friday and 2.5 mg all other days.check inr 7/29. (Patient taking differently: Take 2.5-5 mg by mouth See admin instructions. Take 1 tablet (5 mg) on Wednesday mornings,  take 1/2 tablet (2.5 mg) on all other mornings) 30 tablet 1  . budesonide-formoterol (SYMBICORT) 160-4.5 MCG/ACT inhaler Inhale 2 puffs into the lungs 2 (two) times daily. 1 Inhaler 12  . glycopyrrolate (ROBINUL) 2 MG tablet Take 1 tablet (2 mg total) by mouth 2 (two) times daily. (Patient not taking: Reported on 01/15/2018) 60 tablet 11  . senna-docusate (SENOKOT-S) 8.6-50 MG tablet Take 1 tablet by mouth 2 (two) times daily.       Assessment: 77 y.o. female admitted with pelvic fracture, h/o DVT, to continue Coumadin  Goal of Therapy:  INR 2-3 Monitor platelets by anticoagulation protocol: Yes   Plan:  Continue home Coumadin  regimen  Anjanette Gilkey, Bronson Curb 01/16/2018,1:10 AM

## 2018-01-15 NOTE — Consult Note (Signed)
Consult was cancelled. Discussed over the phone.

## 2018-01-15 NOTE — ED Notes (Signed)
Patient transported to MRI 

## 2018-01-15 NOTE — ED Notes (Signed)
Per charge Altha Harm, okay to wait until MRI results before bringing her upstairs per floors request.

## 2018-01-15 NOTE — H&P (Addendum)
History and Physical    [name] HMC:[id number] DOB: 1940/06/18 DOA: 01/15/2018  PCP: Martinique, Betty G, MD  Patient coming from: Home.  History obtained from patient's daughter.  Chief Complaint: Fall.  HPI: [name] is a 77 y.o. female with history of ESRD, carotid artery disease status post recent right lower extremity stent placement, history of DVT on Coumadin, CHF, chronic anemia who was recently admitted for pneumonia and was discharged home was brought to the ER after patient had 2 falls over the last 24 hours.  As per the patient's daughter first fall was when patient was with the health aide.  Did not lose consciousness.  Second fall was when patient was alone.  Patient's daughter heard a sound ventral check patient was on the floor.  By the time patient did not lose consciousness.  Did not have any chest pain or shortness of breath.  Had some slurred speech and difficulty speaking he was was called and was brought to the ER.  ED Course: In the ER MRI brain did not show anything acute.  X-rays of the right hip shows inferior pubic rami fracture.  Patient's confusion is likely could be from missing dialysis yesterday.  Patient also received some pain medication on route.  Review of Systems: As per HPI, rest all negative.   Past Medical History:  Diagnosis Date  . Abnormality of gait 03/28/2015  . Adrenal mass ([place])   . 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 Arthritis   . Back pain   . CHF (congestive heart failure) ([place])   . Chronic kidney disease    Hemo MWF  . Congestion of throat    Pt states she has a lot mucus in back throat.  . Constipation   . COPD (chronic obstructive pulmonary disease) ([place])   . Depression   . Dialysis patient [facility])    kidney  . Diverticulitis   . GERD (gastroesophageal reflux disease)   . H/O hiatal hernia   . High cholesterol   . Hoarseness of voice   . Hyperlipidemia   . Hypertension   . IBS  (irritable bowel syndrome)   . Meralgia paresthetica of right side 12/26/2014  . Normal cardiac stress test 12/24/2009   lexiscan, imaging normal  . PAD (peripheral artery disease) ([place]) 12/21/2017  . Pneumonia    [id number]  . Renal disorder   . RLS (restless legs syndrome)   . Seizures ([place])    2004 past brain surgery  . Sinus complaint   . Stroke ([place])    TIAs per patient 2 or 3  . Thyroid disease   . TIA (transient ischemic attack)   . Tubular adenoma of colon 01/2008    Past Surgical History:  Procedure Laterality Date  . A/V SHUNT INTERVENTION N/A 08/13/2017   Procedure: A/V SHUNT INTERVENTION;  Surgeon: Algernon Huxley, MD;  Location: [place] [facility];  Service: Cardiovascular;  Laterality: N/A;  . A/V SHUNTOGRAM Left 08/13/2017   Procedure: A/V SHUNTOGRAM;  Surgeon: Algernon Huxley, MD;  Location: [place] [facility];  Service: Cardiovascular;  Laterality: Left;  . ABDOMINAL HYSTERECTOMY    . ANAL RECTAL MANOMETRY N/A 07/25/2015   Procedure: ANO RECTAL MANOMETRY;  Surgeon: Mauri Pole, MD;  Location: [facility];  Service: Endoscopy;  Laterality: N/A;  . APPENDECTOMY    . AV FISTULA PLACEMENT Left 11/11/2012   Procedure: INSERTION OF ARTERIOVENOUS (AV) GORE-TEX  GRAFT ARM;  Surgeon: Angelia Mould, MD;  Location: [place];  Service: Vascular;  Laterality: Left;  . [place] REMOVAL Left 11/18/2012   Procedure: REMOVAL OF LEFT UPPER ARM ARTERIOVENOUS GORETEX GRAFT ([place]);  Surgeon: Angelia Mould, MD;  Location: [place];  Service: Vascular;  Laterality: Left;  . CARDIAC CATHETERIZATION  2003   normal  . CHOLECYSTECTOMY     Open mid line incision  . frontal craniotomy  2002   indication = sinusitis  . INSERTION OF DIALYSIS CATHETER     Left  . LOWER EXTREMITY ANGIOGRAPHY Right 12/17/2017   Procedure: LOWER EXTREMITY ANGIOGRAPHY;  Surgeon: Algernon Huxley, MD;  Location: [place] [facility];  Service: Cardiovascular;  Laterality: Right;  . NASAL SINUS SURGERY Bilateral  2002  . PATCH ANGIOPLASTY Left 11/18/2012   Procedure: PATCH ANGIOPLASTY;  Surgeon: Angelia Mould, MD;  Location: [place];  Service: Vascular;  Laterality: Left;  . PERIPHERAL VASCULAR CATHETERIZATION Left 03/08/2015   Procedure: A/V Shuntogram/Fistulagram;  Surgeon: Algernon Huxley, MD;  Location: [place] [facility];  Service: Cardiovascular;  Laterality: Left;  . PERIPHERAL VASCULAR CATHETERIZATION Left 03/08/2015   Procedure: A/V Shunt Intervention;  Surgeon: Algernon Huxley, MD;  Location: [place] [facility];  Service: Cardiovascular;  Laterality: Left;  . RECTAL ULTRASOUND N/A 10/05/2015   Procedure: ANAL ULTRASOUND WITH PROBE;  Surgeon: Leighton Ruff, MD;  [facility];  Service: Endoscopy;  Laterality: N/A;  . REVISON OF ARTERIOVENOUS FISTULA  05/11/2012   Procedure: REVISON OF ARTERIOVENOUS FISTULA;  Surgeon: Elam Dutch, MD;  Location: [place];  Service: Vascular;  Laterality: Right;  . TUBAL LIGATION       reports that she has been smoking cigarettes. She has a 60.00 pack-year smoking history. She has never used smokeless tobacco. She reports that she does not drink alcohol or use drugs.  Allergies  Allergen Reactions  . Tuberculin Tests Hives    "blisters"  . Valacyclovir Other (See Comments)    Confusion and nervousness  . Codeine Nausea And Vomiting  . Penicillins Rash    No problems breathing. Has tolerated omnicef in past without issue Has patient had a PCN reaction causing immediate rash, facial/tongue/throat swelling, SOB or lightheadedness with hypotension: Yes Has patient had a PCN reaction causing severe rash involving mucus membranes or skin necrosis: No Has patient had a PCN reaction that required hospitalization No Has patient had a PCN reaction occurring within the last 10 years: No If all of the above answers are "NO", then may proceed with Cephalosporin  . Sulfamethoxazole Rash    Family History  Problem Relation Age of Onset  . Thyroid cancer  Mother   . Heart disease Father   . Hypertension Father   . Heart attack Father   . Lupus Daughter   . Breast cancer Sister   . Thyroid cancer Sister   . Kidney disease Sister   . Colon cancer Neg Hx     Prior to Admission medications   Medication Sig Start Date End Date Taking? Authorizing Provider  albuterol (PROVENTIL HFA;VENTOLIN HFA) 108 (90 Base) MCG/ACT inhaler Inhale 2 puffs into the lungs every 4 (four) hours as needed for wheezing or shortness of breath. 03/11/17  Yes Martinique, Betty G, MD  allopurinol (ZYLOPRIM) 100 MG tablet Take 100 mg by mouth once daily on non-dialysis days (Tues/Thurs/Sat/Sun) Patient taking differently: Take 100 mg by mouth See admin instructions. Take one tablet (100 mg) by mouth once daily on non-dialysis  days (Tues/Thurs/Sat/Sun) 11/13/15  Yes Smiley Houseman, MD  amLODipine (NORVASC) 10 MG tablet TAKE 1 TABLET BY MOUTH AT BEDTIME Patient taking differently: Take 10 mg by mouth at bedtime.  06/01/17  Yes Martinique, Betty G, MD  atorvastatin (LIPITOR) 40 MG tablet Take 1 tablet (40 mg total) by mouth daily. Patient taking differently: Take 40 mg by mouth at bedtime.  02/26/15  Yes Rumley, Archbold N, DO  azelastine (ASTELIN) 0.1 % nasal spray Place 1 spray into both nostrils 2 (two) times daily. Use in each nostril as directed Patient taking differently: Place 1 spray into both nostrils 2 (two) times daily as needed for rhinitis or allergies. Use in each nostril as directed 08/07/16  Yes Martinique, Betty G, MD  B Complex-C-Zn-Folic Acid (DIALYVITE/ZINC) TABS Take 1 tablet by mouth daily. 09/25/17  Yes [provider]  cefUROXime (CEFTIN) 500 MG tablet Take 500 mg after HD days. Patient taking differently: Take 500 mg by mouth See admin instructions. Start date 01/15/18: take one tablet (500 mg) by mouth after dialysis (Monday, Wednesday, Friday) for 4 doses 01/13/18  Yes Regalado, Belkys A, MD  cinacalcet (SENSIPAR) 90 MG tablet Take 90 mg by mouth at  bedtime.   Yes [provider]  DULoxetine (CYMBALTA) 30 MG capsule Take 2 capsules (60 mg total) by mouth daily. Patient taking differently: Take 60 mg by mouth at bedtime.  01/08/17  Yes Kathrynn Ducking, MD  fluticasone [facility]) 50 MCG/ACT nasal spray Place 2 sprays into both nostrils daily as needed for allergies or rhinitis. 10/29/17  Yes [place]ique, Betty G, MD  hydrOXYzine (ATARAX/VISTARIL) 25 MG tablet Take 1 tablet (25 mg total) by mouth every 8 (eight) hours as needed for itching. 09/16/17  Yes Martinique, Betty G, MD  lidocaine-prilocaine (EMLA) cream Apply 1 application topically See admin instructions. Apply topically one hour before dialysis - Monday, Wednesday, Friday   Yes [provider]  loratadine (CLARITIN) 10 MG tablet 10 mg every other day. Patient taking differently: Take 10 mg by mouth daily as needed for allergies. 10 mg every other day. 10/29/17  Yes Martinique, Betty G, MD  losartan (COZAAR) 100 MG tablet Take 1 tablet (100 mg total) by mouth daily. 10/29/17  Yes Martinique, Betty G, MD  metoprolol succinate (TOPROL-XL) 100 MG 24 hr tablet TAKE 1 TABLET (100 MG)  BY MOUTH TWICE DAILY Patient taking differently: Take 100 mg by mouth 2 (two) times daily.  10/13/17  Yes [place]ique, Betty G, MD  multivitamin (RENA-VIT) TABS tablet Take 1 tablet by mouth at bedtime. Patient taking differently: Take 1 tablet by mouth daily.  01/01/15  Yes Haney, Alyssa A, MD  oxyCODONE-acetaminophen (PERCOCET) 5-325 MG tablet Take 1 tablet by mouth every 8 (eight) hours as needed for severe pain. Patient taking differently: Take 0.5-1 tablets by mouth every 8 (eight) hours as needed for severe pain.  01/05/18  Yes Martinique, Betty G, MD  polyethylene glycol powder (MIRALAX) powder Take 17 g by mouth daily. 12/29/17  Yes Jola Schmidt, MD  predniSONE (DELTASONE) 20 MG tablet Take 2 tablets (40 mg total) by mouth daily with breakfast. 01/14/18  Yes Regalado, Belkys A, MD  pregabalin (LYRICA) 100 MG capsule Take  100 mg by mouth at bedtime.    Yes [provider]  sevelamer carbonate (RENVELA) 800 MG tablet Take 2,400 mg by mouth See admin instructions. Take 3 tablets (2400 mg) by mouth with each meal or snack - once or twice daily   Yes [provider]  warfarin (COUMADIN) 5 MG tablet Take 5 mg Monday Wednesday Friday and 2.5 mg all other days.check inr 7/29. Patient taking differently: Take 2.5-5 mg by mouth See admin instructions. Take 1 tablet (5 mg) on Wednesday mornings,  take 1/2 tablet (2.5 mg) on all other mornings 12/27/17  Yes Georgette Shell, MD  budesonide-formoterol [facility]) 160-4.5 MCG/ACT inhaler Inhale 2 puffs into the lungs 2 (two) times daily. 07/26/17   Bonnell Public, MD  glycopyrrolate (ROBINUL) 2 MG tablet Take 1 tablet (2 mg total) by mouth 2 (two) times daily. Patient not taking: Reported on 01/15/2018 08/25/17   Ladene Artist, MD  senna-docusate (SENOKOT-S) 8.6-50 MG tablet Take 1 tablet by mouth 2 (two) times daily. 12/27/17   Georgette Shell, MD    Physical Exam: Vitals:   01/15/18 2130 01/15/18 2306 01/15/18 2307 01/15/18 2315  BP: (!) 160/48 (!) 164/62  (!) 183/53  Pulse: 73 74  70  Resp: 13 (!) 25  17  Temp:   98.2 F (36.8 C)   TempSrc:      SpO2: 100% 100%  98%  Weight:      Height:          Constitutional: Moderately built and nourished. Vitals:   01/15/18 2130 01/15/18 2306 01/15/18 2307 01/15/18 2315  BP: (!) 160/48 (!) 164/62  (!) 183/53  Pulse: 73 74  70  Resp: 13 (!) 25  17  Temp:   98.2 F (36.8 C)   TempSrc:      SpO2: 100% 100%  98%  Weight:      Height:       Eyes: Anicteric no pallor. ENMT: No discharge from the ears eyes nose or mouth. Neck: No mass felt.  No neck rigidity. Respiratory: No rhonchi or crepitations. Cardiovascular: S1-S2 heard no murmurs appreciated. Abdomen: Soft nontender bowel sounds present. Musculoskeletal: Pain on moving right hip. Skin: No rash. Neurologic: Appears mildly  confused follows commands. Psychiatric: Appears confused.   Labs on Admission: I have personally reviewed following labs and imaging studies  CBC: Recent Labs  Lab 01/12/18 0415 01/12/18 2222 01/13/18 0824 01/15/18 1708  WBC 8.6 5.3 6.8 8.5  NEUTROABS  --   --   --  4.5  HGB 11.0* 10.2* 10.1* 11.0*  HCT 34.5* 32.7* 32.9* 37.8  MCV 89.6 89.1 89.9 90.6  PLT 176 174 189 725   Basic Metabolic Panel: Recent Labs  Lab 01/12/18 0415 01/12/18 2222 01/13/18 0824 01/15/18 1708  NA 140 139 139 137  K 3.3* 3.6 4.1 4.5  CL 96* 97* 101 101  CO2 31 30 28 28   GLUCOSE 79 206* 126* 120*  BUN 7* 15 19 22   CREATININE 4.63* 6.17* 6.51* 1.34*  CALCIUM 8.5* 8.1* 8.3* 9.5  PHOS  --  2.5 3.2  --    GFR: Estimated Creatinine Clearance: 31.6 mL/min (A) (by C-G formula based on SCr of 1.34 mg/dL (H)). Liver Function Tests: Recent Labs  Lab 01/12/18 2222 01/13/18 0824 01/15/18 1708  AST  --   --  21  ALT  --   --  15  ALKPHOS  --   --  105  BILITOT  --   --  0.4  PROT  --   --  7.8  ALBUMIN 2.0* 1.9* 3.5   No results for input(s): LIPASE, AMYLASE in the last 168 hours. No results for input(s): AMMONIA in the last 168 hours. Coagulation Profile: Recent Labs  Lab 01/12/18 0438 01/13/18  0532 01/15/18 1708  INR 1.92 2.13 1.06   Cardiac Enzymes: No results for input(s): CKTOTAL, CKMB, CKMBINDEX, TROPONINI in the last 168 hours. BNP (last 3 results) No results for input(s): PROBNP in the last 8760 hours. HbA1C: No results for input(s): HGBA1C in the last 72 hours. CBG: No results for input(s): GLUCAP in the last 168 hours. Lipid Profile: No results for input(s): CHOL, HDL, LDLCALC, TRIG, CHOLHDL, LDLDIRECT in the last 72 hours. Thyroid Function Tests: No results for input(s): TSH, T4TOTAL, FREET4, T3FREE, THYROIDAB in the last 72 hours. Anemia Panel: No results for input(s): VITAMINB12, FOLATE, FERRITIN, TIBC, IRON, RETICCTPCT in the last 72 hours. Urine analysis:      Component Value Date/Time   COLORURINE YELLOW 12/30/2014 1419   APPEARANCEUR CLEAR 12/30/2014 1419   LABSPEC 1.009 12/30/2014 1419   PHURINE 7.5 12/30/2014 1419   GLUCOSEU NEGATIVE 12/30/2014 1419   HGBUR NEGATIVE 12/30/2014 1419   HGBUR negative 03/23/2007 1112   BILIRUBINUR NEGATIVE 12/30/2014 1419   KETONESUR NEGATIVE 12/30/2014 1419   PROTEINUR 100 (A) 12/30/2014 1419   UROBILINOGEN 0.2 12/30/2014 1419   NITRITE NEGATIVE 12/30/2014 1419   LEUKOCYTESUR NEGATIVE 12/30/2014 1419   Sepsis Labs: @LABRCNTIP (procalcitonin:4,lacticidven:4) ) Recent Results (from the past 240 hour(s))  Culture, sputum-assessment     Status: None   Collection Time: 01/12/18  9:40 PM  Result Value Ref Range Status   Specimen Description EXPECTORATED SPUTUM  Final   Special Requests NONE  Final   Sputum evaluation   Final    THIS SPECIMEN IS ACCEPTABLE FOR SPUTUM CULTURE Performed at [facility], [place] [address], [place], [place] [address]    Report Status 01/13/2018 FINAL  Final  Culture, respiratory     Status: None (Preliminary result)   Collection Time: 01/12/18  9:40 PM  Result Value Ref Range Status   Specimen Description EXPECTORATED SPUTUM  Final   Special Requests NONE Reflexed from [id number]  Final   Gram Stain   Final    MODERATE WBC PRESENT,BOTH PMN AND MONONUCLEAR NO ORGANISMS SEEN    Culture   Final    RARE STAPHYLOCOCCUS AUREUS SUSCEPTIBILITIES TO FOLLOW Performed at [facility], [place] [address], [place], [place] [address]    Report Status PENDING  Incomplete     Radiological Exams on Admission: Dg Chest 2 View  Result Date: 01/15/2018 CLINICAL DATA:  Fall leg pain EXAM: CHEST - 2 VIEW COMPARISON:  01/12/2018, 12/21/2017, 08/20/2017 FINDINGS: Left-sided central venous catheter tip projects over the right atrium. Right mid lung opacity has resolved. Chronic elevation of right diaphragm. Low lung volume. Mildly enlarged cardiomediastinal silhouette with aortic  atherosclerosis. No pneumothorax. IMPRESSION: No active cardiopulmonary disease.  Cardiomegaly. Electronically Signed   By: Donavan Foil M.D.   On: 01/15/2018 18:36   Dg Knee 2 Views Right  Result Date: 01/15/2018 CLINICAL DATA:  Fall with knee pain EXAM: RIGHT KNEE - 1-2 VIEW COMPARISON:  10/05/2003 FINDINGS: Stent in the mid to distal thigh. No fracture or malalignment. Small knee effusion. Mild degenerative change of the medial compartment. IMPRESSION: No acute osseous abnormality.  Small knee effusion. Electronically Signed   By: Donavan Foil M.D.   On: 01/15/2018 18:39   Ct Head Wo Contrast  Result Date: 01/15/2018 CLINICAL DATA:  77 y/o F; 2 falls today. Word-finding difficulty. Right knee pain. Missed dialysis today. EXAM: CT HEAD WITHOUT CONTRAST CT CERVICAL SPINE WITHOUT CONTRAST TECHNIQUE: Multidetector CT imaging of the head and cervical spine was performed following  the standard protocol without intravenous contrast. Multiplanar CT image reconstructions of the cervical spine were also generated. COMPARISON:  12/25/2017 CT head. FINDINGS: CT HEAD FINDINGS Brain: No evidence of acute infarction, hemorrhage, hydrocephalus, extra-axial collection or mass lesion/mass effect. Left anterior frontal stable encephalomalacia. Empty sella turcica. Vascular: Calcific atherosclerosis of carotid siphons. No hyperdense vessel. Skull: Left parietal scalp contusion. No calvarial fracture. Chronic postsurgical changes related to bifrontal craniotomy. Sinuses/Orbits: Left-sided ethmoidectomy and maxillary antrostomy postsurgical changes. Mild mucosal thickening of the paranasal sinuses. Normal aeration of the mastoid air cells. Bilateral intra-ocular lens replacement. Other: None. CT CERVICAL SPINE FINDINGS Alignment: C4-5 and C5-6 grade 1 anterolisthesis. Skull base and vertebrae: No acute fracture. No primary bone lesion or focal pathologic process. Soft tissues and spinal canal: No prevertebral fluid or  swelling. No visible canal hematoma. 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 Cervical spondylosis with mild multilevel disc and moderate facet arthrosis. No high-grade bony canal stenosis. Upper chest: Left subclavian stent partially visualized. Calcific atherosclerosis of the carotid bifurcations and the aorta. Other: 10 mm nodule in the left lobe of the thyroid. IMPRESSION: 1. Left parietal scalp contusion. 2. No calvarial fracture or acute intracranial abnormality. 3. No acute fracture or dislocation of the cervical spine. 4. Stable left anterior frontal lobe encephalomalacia and empty sella turcica. 5. Mild paranasal sinus mucosal thickening. 6. 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 Electronically Signed   By: Kristine Garbe M.D.   On: 01/15/2018 19:08   Ct Cervical Spine Wo Contrast  Result Date: 01/15/2018 CLINICAL DATA:  77 y/o F; 2 falls today. Word-finding difficulty. Right knee pain. Missed dialysis today. EXAM: CT HEAD WITHOUT CONTRAST CT CERVICAL SPINE WITHOUT CONTRAST TECHNIQUE: Multidetector CT imaging of the head and cervical spine was performed following the standard protocol without intravenous contrast. Multiplanar CT image reconstructions of the cervical spine were also generated. COMPARISON:  12/25/2017 CT head. FINDINGS: CT HEAD FINDINGS Brain: No evidence of acute infarction, hemorrhage, hydrocephalus, extra-axial collection or mass lesion/mass effect. Left anterior frontal stable encephalomalacia. Empty sella turcica. Vascular: Calcific atherosclerosis of carotid siphons. No hyperdense vessel. Skull: Left parietal scalp contusion. No calvarial fracture. Chronic postsurgical changes related to bifrontal craniotomy. Sinuses/Orbits: Left-sided ethmoidectomy and maxillary antrostomy postsurgical changes. Mild mucosal thickening of the paranasal sinuses. Normal aeration  of the mastoid air cells. Bilateral intra-ocular lens replacement. Other: None. CT CERVICAL SPINE FINDINGS Alignment: C4-5 and C5-6 grade 1 anterolisthesis. Skull base and vertebrae: No acute fracture. No primary bone lesion or focal pathologic process. Soft tissues and spinal canal: No prevertebral fluid or swelling. No visible canal hematoma. 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 Cervical spondylosis with mild multilevel disc and moderate facet arthrosis. No high-grade bony canal stenosis. Upper chest: Left subclavian stent partially visualized. Calcific atherosclerosis of the carotid bifurcations and the aorta. Other: 10 mm nodule in the left lobe of the thyroid. IMPRESSION: 1. Left parietal scalp contusion. 2. No calvarial fracture or acute intracranial abnormality. 3. No acute fracture or dislocation of the cervical spine. 4. Stable left anterior frontal lobe encephalomalacia and empty sella turcica. 5. Mild paranasal sinus mucosal thickening. 6. 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 Electronically Signed   By: Kristine Garbe M.D.   On: 01/15/2018 19:08   Mr Brain Wo Contrast  Result Date: 01/15/2018 CLINICAL DATA:  Focal neuro deficit, > 6 hrs,  stroke suspected. Word-finding difficulty and multiple falls EXAM: MRI HEAD WITHOUT CONTRAST TECHNIQUE: Multiplanar, multiecho pulse sequences of the brain and surrounding structures were obtained without intravenous contrast. COMPARISON:  Head CT 01/15/2018 FINDINGS: The examination had to be discontinued prior to completion due to patient inability to cooperate with the technologist's instructions. The patient was attempting to remove the head coil. There are 9 series provided. Axial T1-weighted imaging and coronal T2-weighted imaging was not obtained. BRAIN: There is no acute infarct, acute hemorrhage or mass effect. Partially empty  sella. Left frontal encephalomalacia is unchanged. 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 White matter signal is otherwise normal. The CSF spaces are normal for age, with no hydrocephalus. Susceptibility-sensitive sequences show no chronic microhemorrhage or superficial siderosis. VASCULAR: Major intracranial arterial and venous sinus flow voids are preserved. SKULL AND UPPER CERVICAL SPINE: Remote bifrontal craniotomy. Large left parietal scalp hematoma. SINUSES/ORBITS: No fluid levels or advanced mucosal thickening. No mastoid or middle ear effusion. There are bilateral lens replacements. IMPRESSION: 1. Truncated examination.  No acute infarct. 2. Left frontal encephalomalacia and multiple punctate, old cerebellar infarcts. 3. Large left parietal scalp hematoma. Electronically Signed   By: Ulyses Jarred M.D.   On: 01/15/2018 22:51   Dg Hips Bilat W Or Wo Pelvis 3-4 Views  Result Date: 01/15/2018 CLINICAL DATA:  Fall with bilateral leg pain EXAM: DG HIP (WITH OR WITHOUT PELVIS) 3-4V BILAT COMPARISON:  CT 08/20/2017 FINDINGS: SI joints are non widened. The pubic symphysis is intact. Possible acute minimally displaced fracture involving the right inferior pubic ramus. Both femoral heads project in joint. Vascular calcifications.  Stent in the right thigh. IMPRESSION: Possible acute minimally displaced fracture involving the right inferior pubic ramus. Otherwise no acute osseous abnormality is seen. Electronically Signed   By: Donavan Foil M.D.   On: 01/15/2018 18:38    EKG: Independently reviewed.  Normal sinus rhythm with QTC of 503 ms.  Assessment/Plan Principal Problem:   Fracture of multiple pubic rami (HCC) Active Problems:   Hypertension   ESRD on dialysis ([place])   PAD (peripheral artery disease) ([place])   Long term (current) use of anticoagulants   History of DVT of lower extremity   Closed fracture of multiple pubic rami, right, initial encounter ([place])    1. Fracture of right inferior  pubic rami status post fall -I have ordered minimal dose of morphine for pain control get physical therapy consult. 2. Difficulty speaking and confusion acute encephalopathy likely could be from patient missing her dialysis yesterday and also pain relief medications but MRI brain CT head was unremarkable.  Patient is finding it difficult to swallow for which she will get swallow evaluation.  Discussed with neurologist. 3. History of DVT on Coumadin since patient failed swallow we will keep patient on heparin until patient passes swallow. 4. Peripheral arterial disease status post recent stent placement on heparin/Coumadin.  See #3. 5. ESRD on hemodialysis on Monday Wednesday and Friday missed dialysis yesterday but will need dialysis on January 16, 2018. 6. Anemia likely from renal disease. 7. Hypertension on PRN IV hydralazine for now since patient failed swallow.  Patient was examined twice.  The first time patient was sleeping.   DVT prophylaxis: Coumadin/heparin. Code Status: Full code. Family Communication: Patient's daughter. Disposition Plan: To be determined. Consults called: Physical therapy. Admission status: Observation.   Rise Patience MD [facility] Pager [phone].  If 7PM-7AM, please contact night-coverage [email or web address] Password [facility]  01/15/2018, 11:24 PM

## 2018-01-16 ENCOUNTER — Other Ambulatory Visit: Payer: Self-pay

## 2018-01-16 ENCOUNTER — Observation Stay (HOSPITAL_COMMUNITY): Payer: Medicare Other

## 2018-01-16 DIAGNOSIS — Z885 Allergy status to narcotic agent status: Secondary | ICD-10-CM | POA: Diagnosis not present

## 2018-01-16 DIAGNOSIS — Z888 Allergy status to other drugs, medicaments and biological substances status: Secondary | ICD-10-CM | POA: Diagnosis not present

## 2018-01-16 DIAGNOSIS — S8254XA Nondisplaced fracture of medial malleolus of right tibia, initial encounter for closed fracture: Secondary | ICD-10-CM | POA: Diagnosis not present

## 2018-01-16 DIAGNOSIS — N2581 Secondary hyperparathyroidism of renal origin: Secondary | ICD-10-CM | POA: Diagnosis not present

## 2018-01-16 DIAGNOSIS — M546 Pain in thoracic spine: Secondary | ICD-10-CM | POA: Diagnosis not present

## 2018-01-16 DIAGNOSIS — M545 Low back pain: Secondary | ICD-10-CM | POA: Diagnosis not present

## 2018-01-16 DIAGNOSIS — R2689 Other abnormalities of gait and mobility: Secondary | ICD-10-CM | POA: Diagnosis not present

## 2018-01-16 DIAGNOSIS — Z7901 Long term (current) use of anticoagulants: Secondary | ICD-10-CM | POA: Diagnosis not present

## 2018-01-16 DIAGNOSIS — R4701 Aphasia: Secondary | ICD-10-CM | POA: Diagnosis present

## 2018-01-16 DIAGNOSIS — S3993XA Unspecified injury of pelvis, initial encounter: Secondary | ICD-10-CM | POA: Diagnosis not present

## 2018-01-16 DIAGNOSIS — M25561 Pain in right knee: Secondary | ICD-10-CM | POA: Diagnosis not present

## 2018-01-16 DIAGNOSIS — Z86718 Personal history of other venous thrombosis and embolism: Secondary | ICD-10-CM | POA: Diagnosis not present

## 2018-01-16 DIAGNOSIS — W19XXXA Unspecified fall, initial encounter: Secondary | ICD-10-CM | POA: Diagnosis not present

## 2018-01-16 DIAGNOSIS — I120 Hypertensive chronic kidney disease with stage 5 chronic kidney disease or end stage renal disease: Secondary | ICD-10-CM | POA: Diagnosis not present

## 2018-01-16 DIAGNOSIS — S32591D Other specified fracture of right pubis, subsequent encounter for fracture with routine healing: Secondary | ICD-10-CM | POA: Diagnosis not present

## 2018-01-16 DIAGNOSIS — G8929 Other chronic pain: Secondary | ICD-10-CM | POA: Diagnosis present

## 2018-01-16 DIAGNOSIS — N186 End stage renal disease: Secondary | ICD-10-CM | POA: Diagnosis not present

## 2018-01-16 DIAGNOSIS — F329 Major depressive disorder, single episode, unspecified: Secondary | ICD-10-CM | POA: Diagnosis present

## 2018-01-16 DIAGNOSIS — R296 Repeated falls: Secondary | ICD-10-CM | POA: Diagnosis present

## 2018-01-16 DIAGNOSIS — Z882 Allergy status to sulfonamides status: Secondary | ICD-10-CM | POA: Diagnosis not present

## 2018-01-16 DIAGNOSIS — S82141A Displaced bicondylar fracture of right tibia, initial encounter for closed fracture: Secondary | ICD-10-CM | POA: Diagnosis present

## 2018-01-16 DIAGNOSIS — Z515 Encounter for palliative care: Secondary | ICD-10-CM | POA: Diagnosis not present

## 2018-01-16 DIAGNOSIS — Y92009 Unspecified place in unspecified non-institutional (private) residence as the place of occurrence of the external cause: Secondary | ICD-10-CM | POA: Diagnosis not present

## 2018-01-16 DIAGNOSIS — S32591A Other specified fracture of right pubis, initial encounter for closed fracture: Secondary | ICD-10-CM | POA: Diagnosis not present

## 2018-01-16 DIAGNOSIS — R1312 Dysphagia, oropharyngeal phase: Secondary | ICD-10-CM | POA: Diagnosis not present

## 2018-01-16 DIAGNOSIS — I739 Peripheral vascular disease, unspecified: Secondary | ICD-10-CM | POA: Diagnosis present

## 2018-01-16 DIAGNOSIS — M2550 Pain in unspecified joint: Secondary | ICD-10-CM | POA: Diagnosis not present

## 2018-01-16 DIAGNOSIS — Z992 Dependence on renal dialysis: Secondary | ICD-10-CM | POA: Diagnosis not present

## 2018-01-16 DIAGNOSIS — Z880 Allergy status to penicillin: Secondary | ICD-10-CM | POA: Diagnosis not present

## 2018-01-16 DIAGNOSIS — K219 Gastro-esophageal reflux disease without esophagitis: Secondary | ICD-10-CM | POA: Diagnosis present

## 2018-01-16 DIAGNOSIS — M6281 Muscle weakness (generalized): Secondary | ICD-10-CM | POA: Diagnosis not present

## 2018-01-16 DIAGNOSIS — S80919A Unspecified superficial injury of unspecified knee, initial encounter: Secondary | ICD-10-CM | POA: Diagnosis not present

## 2018-01-16 DIAGNOSIS — I132 Hypertensive heart and chronic kidney disease with heart failure and with stage 5 chronic kidney disease, or end stage renal disease: Secondary | ICD-10-CM | POA: Diagnosis present

## 2018-01-16 DIAGNOSIS — R278 Other lack of coordination: Secondary | ICD-10-CM | POA: Diagnosis not present

## 2018-01-16 DIAGNOSIS — S32591S Other specified fracture of right pubis, sequela: Secondary | ICD-10-CM | POA: Diagnosis not present

## 2018-01-16 DIAGNOSIS — R488 Other symbolic dysfunctions: Secondary | ICD-10-CM | POA: Diagnosis not present

## 2018-01-16 DIAGNOSIS — D631 Anemia in chronic kidney disease: Secondary | ICD-10-CM | POA: Diagnosis not present

## 2018-01-16 DIAGNOSIS — R471 Dysarthria and anarthria: Secondary | ICD-10-CM | POA: Diagnosis present

## 2018-01-16 DIAGNOSIS — I5022 Chronic systolic (congestive) heart failure: Secondary | ICD-10-CM | POA: Diagnosis present

## 2018-01-16 DIAGNOSIS — Z66 Do not resuscitate: Secondary | ICD-10-CM | POA: Diagnosis present

## 2018-01-16 DIAGNOSIS — E785 Hyperlipidemia, unspecified: Secondary | ICD-10-CM | POA: Diagnosis present

## 2018-01-16 DIAGNOSIS — J449 Chronic obstructive pulmonary disease, unspecified: Secondary | ICD-10-CM | POA: Diagnosis present

## 2018-01-16 DIAGNOSIS — G92 Toxic encephalopathy: Secondary | ICD-10-CM | POA: Diagnosis present

## 2018-01-16 DIAGNOSIS — M1990 Unspecified osteoarthritis, unspecified site: Secondary | ICD-10-CM | POA: Diagnosis present

## 2018-01-16 DIAGNOSIS — Z7189 Other specified counseling: Secondary | ICD-10-CM | POA: Diagnosis not present

## 2018-01-16 DIAGNOSIS — S299XXA Unspecified injury of thorax, initial encounter: Secondary | ICD-10-CM | POA: Diagnosis not present

## 2018-01-16 DIAGNOSIS — M549 Dorsalgia, unspecified: Secondary | ICD-10-CM

## 2018-01-16 DIAGNOSIS — S3992XA Unspecified injury of lower back, initial encounter: Secondary | ICD-10-CM | POA: Diagnosis not present

## 2018-01-16 DIAGNOSIS — Z7401 Bed confinement status: Secondary | ICD-10-CM | POA: Diagnosis not present

## 2018-01-16 LAB — COMPREHENSIVE METABOLIC PANEL
ALK PHOS: 74 U/L (ref 38–126)
ALT: 15 U/L (ref 0–44)
ANION GAP: 16 — AB (ref 5–15)
AST: 32 U/L (ref 15–41)
Albumin: 2.1 g/dL — ABNORMAL LOW (ref 3.5–5.0)
BILIRUBIN TOTAL: 1.4 mg/dL — AB (ref 0.3–1.2)
BUN: 24 mg/dL — ABNORMAL HIGH (ref 8–23)
CALCIUM: 8.2 mg/dL — AB (ref 8.9–10.3)
CO2: 26 mmol/L (ref 22–32)
Chloride: 97 mmol/L — ABNORMAL LOW (ref 98–111)
Creatinine, Ser: 7.81 mg/dL — ABNORMAL HIGH (ref 0.44–1.00)
GFR, EST AFRICAN AMERICAN: 5 mL/min — AB (ref >60)
GFR, EST NON AFRICAN AMERICAN: 4 mL/min — AB (ref >60)
Glucose, Bld: 77 mg/dL (ref 70–99)
Potassium: 3.8 mmol/L (ref 3.5–5.1)
SODIUM: 139 mmol/L (ref 135–145)
TOTAL PROTEIN: 6.1 g/dL — AB (ref 6.5–8.1)

## 2018-01-16 LAB — GLUCOSE, CAPILLARY
GLUCOSE-CAPILLARY: 49 mg/dL — AB (ref 70–99)
Glucose-Capillary: 130 mg/dL — ABNORMAL HIGH (ref 70–99)
Glucose-Capillary: 75 mg/dL (ref 70–99)

## 2018-01-16 LAB — PROTIME-INR
INR: 2.36
PROTHROMBIN TIME: 25.6 s — AB (ref 11.4–15.2)

## 2018-01-16 LAB — CBC
HCT: 32.6 % — ABNORMAL LOW (ref 36.0–46.0)
Hemoglobin: 10.4 g/dL — ABNORMAL LOW (ref 12.0–15.0)
MCH: 28.3 pg (ref 26.0–34.0)
MCHC: 31.9 g/dL (ref 30.0–36.0)
MCV: 88.6 fL (ref 78.0–100.0)
Platelets: 143 10*3/uL — ABNORMAL LOW (ref 150–400)
RBC: 3.68 MIL/uL — AB (ref 3.87–5.11)
RDW: 16.7 % — ABNORMAL HIGH (ref 11.5–15.5)
WBC: 7.8 10*3/uL (ref 4.0–10.5)

## 2018-01-16 LAB — CULTURE, RESPIRATORY W GRAM STAIN

## 2018-01-16 LAB — CULTURE, RESPIRATORY

## 2018-01-16 MED ORDER — DEXTROSE 50 % IV SOLN
INTRAVENOUS | Status: AC
  Administered 2018-01-16: 25 mL via INTRAVENOUS
  Filled 2018-01-16: qty 50

## 2018-01-16 MED ORDER — HYDRALAZINE HCL 20 MG/ML IJ SOLN: 10.0000 mg | INTRAMUSCULAR | Status: DC | PRN

## 2018-01-16 MED ORDER — ACETAMINOPHEN 500 MG PO TABS
1000.0000 mg | ORAL_TABLET | Freq: Three times a day (TID) | ORAL | Status: DC
  Administered 2018-01-16 – 2018-01-22 (×14): 1000 mg via ORAL
  Filled 2018-01-16 (×14): qty 2

## 2018-01-16 MED ORDER — ACETAMINOPHEN 325 MG PO TABS
ORAL_TABLET | ORAL | Status: AC
  Administered 2018-01-16: 650 mg via ORAL
  Filled 2018-01-16: qty 2

## 2018-01-16 MED ORDER — DEXTROSE 50 % IV SOLN
25.0000 mL | Freq: Once | INTRAVENOUS | Status: AC
  Administered 2018-01-16 (×2): 25 mL via INTRAVENOUS

## 2018-01-16 MED ORDER — LIDOCAINE HCL (PF) 1 % IJ SOLN: 5.0000 mL | INTRAMUSCULAR | Status: DC | PRN

## 2018-01-16 MED ORDER — SODIUM CHLORIDE 0.9 % IV SOLN: 100.0000 mL | INTRAVENOUS | Status: DC | PRN

## 2018-01-16 MED ORDER — WARFARIN SODIUM 5 MG PO TABS: 5.0000 mg | ORAL_TABLET | ORAL | Status: DC

## 2018-01-16 MED ORDER — MORPHINE SULFATE (PF) 2 MG/ML IV SOLN
INTRAVENOUS | Status: AC
  Administered 2018-01-16: 0.5 mg via INTRAVENOUS
  Filled 2018-01-16: qty 1

## 2018-01-16 MED ORDER — WARFARIN - PHARMACIST DOSING INPATIENT
Freq: Every day | Status: DC
Start: 2018-01-16 — End: 2018-01-18
  Administered 2018-01-18: 18:00:00

## 2018-01-16 MED ORDER — HEPARIN SODIUM (PORCINE) 1000 UNIT/ML DIALYSIS: 1000.0000 [IU] | INTRAMUSCULAR | Status: DC | PRN

## 2018-01-16 MED ORDER — DICLOFENAC SODIUM 1 % TD GEL
2.0000 g | Freq: Four times a day (QID) | TRANSDERMAL | Status: DC
  Administered 2018-01-18 (×2): 2 g via TOPICAL
  Filled 2018-01-16: qty 100

## 2018-01-16 MED ORDER — METOPROLOL SUCCINATE ER 100 MG PO TB24
100.0000 mg | ORAL_TABLET | Freq: Every day | ORAL | Status: DC
  Administered 2018-01-17 – 2018-01-21 (×5): 100 mg via ORAL
  Filled 2018-01-16 (×6): qty 1

## 2018-01-16 MED ORDER — LIDOCAINE-PRILOCAINE 2.5-2.5 % EX CREA
1.0000 "application " | TOPICAL_CREAM | CUTANEOUS | Status: DC | PRN
  Filled 2018-01-16: qty 5

## 2018-01-16 MED ORDER — DOXERCALCIFEROL 4 MCG/2ML IV SOLN
1.0000 ug | INTRAVENOUS | Status: DC
  Administered 2018-01-20 – 2018-01-22 (×2): 1 ug via INTRAVENOUS
  Filled 2018-01-16 (×2): qty 2

## 2018-01-16 MED ORDER — LOSARTAN POTASSIUM 50 MG PO TABS
100.0000 mg | ORAL_TABLET | Freq: Every day | ORAL | Status: DC
  Administered 2018-01-17 – 2018-01-22 (×6): 100 mg via ORAL
  Filled 2018-01-16 (×6): qty 2

## 2018-01-16 MED ORDER — OXYCODONE HCL 5 MG PO TABS
2.5000 mg | ORAL_TABLET | ORAL | Status: DC | PRN
  Administered 2018-01-19 – 2018-01-22 (×11): 2.5 mg via ORAL
  Filled 2018-01-16 (×11): qty 1

## 2018-01-16 MED ORDER — LIDOCAINE 5 % EX PTCH
1.0000 | MEDICATED_PATCH | CUTANEOUS | Status: DC
  Filled 2018-01-16 (×7): qty 1

## 2018-01-16 MED ORDER — PENTAFLUOROPROP-TETRAFLUOROETH EX AERO: 1.0000 "application " | INHALATION_SPRAY | CUTANEOUS | Status: DC | PRN

## 2018-01-16 MED ORDER — WARFARIN SODIUM 2.5 MG PO TABS
2.5000 mg | ORAL_TABLET | ORAL | Status: DC
  Filled 2018-01-16 (×2): qty 1

## 2018-01-16 MED ORDER — CHLORHEXIDINE GLUCONATE CLOTH 2 % EX PADS
6.0000 | MEDICATED_PAD | Freq: Every day | CUTANEOUS | Status: DC
Start: 2018-01-16 — End: 2018-01-17

## 2018-01-16 NOTE — Progress Notes (Signed)
ANTICOAGULATION CONSULT NOTE - Initial Consult  Pharmacy Consult for Coumadin Indication: h/o DVT  Allergies  Allergen Reactions  . Tuberculin Tests Hives    "blisters"  . Valacyclovir Other (See Comments)    Confusion and nervousness  . Codeine Nausea And Vomiting  . Penicillins Rash    No problems breathing. Has tolerated omnicef in past without issue Has patient had a PCN reaction causing immediate rash, facial/tongue/throat swelling, SOB or lightheadedness with hypotension: Yes Has patient had a PCN reaction causing severe rash involving mucus membranes or skin necrosis: No Has patient had a PCN reaction that required hospitalization No Has patient had a PCN reaction occurring within the last 10 years: No If all of the above answers are "NO", then may proceed with Cephalosporin  . Sulfamethoxazole Rash    Patient Measurements: Height: 5\' 1"  (154.9 cm) Weight: 154 lb 1.6 oz (69.9 kg) IBW/kg (Calculated) : 47.8  Vital Signs: Temp: 98.2 F (36.8 C) (08/17 0522) Temp Source: Oral (08/17 0522) BP: 139/47 (08/17 0522) Pulse Rate: 76 (08/17 0522)  Labs: Recent Labs    01/15/18 1708 01/16/18 0014  HGB 11.0* 10.4*  HCT 37.8 32.6*  PLT 286 143*  APTT 31  --   LABPROT 13.7 25.6*  INR 1.06 2.36  CREATININE 1.34* 7.81*    Estimated Creatinine Clearance: 5.5 mL/min (A) (by C-G formula based on SCr of 7.81 mg/dL (H)).   Medical History: Past Medical History:  Diagnosis Date  . Abnormality of gait 03/28/2015  . Adrenal mass ([place])   . 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 Arthritis   . Back pain   . CHF (congestive heart failure) ([place])   . Chronic kidney disease    Hemo MWF  . Congestion of throat    Pt states she has a lot mucus in back throat.  . Constipation   . COPD (chronic obstructive pulmonary disease) ([employer] [facility] [employer] [facility])   . Depression   . Dialysis patient [facility] ([place]    kidney  . Diverticulitis   . GERD (gastroesophageal reflux  disease)   . H/O hiatal hernia   . High cholesterol   . Hoarseness of voice   . Hyperlipidemia   . Hypertension   . IBS (irritable bowel syndrome)   . Meralgia paresthetica of right side 12/26/2014  . Normal cardiac stress test 12/24/2009   lexiscan, imaging normal  . PAD (peripheral artery disease) ([place]) 12/21/2017  . Pneumonia    [id number]  . Renal disorder   . RLS (restless legs syndrome)   . Seizures ([place])    2004 past brain surgery  . Sinus complaint   . Stroke ([place])    TIAs per patient 2 or 3  . Thyroid disease   . TIA (transient ischemic attack)   . Tubular adenoma of colon 01/2008    Medications:  No current facility-administered medications on file prior to encounter.    Current Outpatient Medications on File Prior to Encounter  Medication Sig Dispense Refill  . albuterol (PROVENTIL HFA;VENTOLIN HFA) 108 (90 Base) MCG/ACT inhaler Inhale 2 puffs into the lungs every 4 (four) hours as needed for wheezing or shortness of breath. 1 Inhaler 1  . allopurinol (ZYLOPRIM) 100 MG tablet Take 100 mg by mouth once daily on non-dialysis days (Tues/Thurs/Sat/Sun) (Patient taking differently: Take 100 mg by mouth See admin instructions. Take one tablet (100 mg) by mouth once daily on non-dialysis days (Tues/Thurs/Sat/Sun)) 30 tablet 0  . amLODipine (  NORVASC) 10 MG tablet TAKE 1 TABLET BY MOUTH AT BEDTIME (Patient taking differently: Take 10 mg by mouth at bedtime. ) 90 tablet 1  . atorvastatin (LIPITOR) 40 MG tablet Take 1 tablet (40 mg total) by mouth daily. (Patient taking differently: Take 40 mg by mouth at bedtime. ) 90 tablet 3  . azelastine (ASTELIN) 0.1 % nasal spray Place 1 spray into both nostrils 2 (two) times daily. Use in each nostril as directed (Patient taking differently: Place 1 spray into both nostrils 2 (two) times daily as needed for rhinitis or allergies. Use in each nostril as directed) 30 mL 6  . B Complex-C-Zn-Folic Acid (DIALYVITE/ZINC) TABS Take 1 tablet by mouth  daily.  6  . cefUROXime (CEFTIN) 500 MG tablet Take 500 mg after HD days. (Patient taking differently: Take 500 mg by mouth See admin instructions. Start date 01/15/18: take one tablet (500 mg) by mouth after dialysis (Monday, Wednesday, Friday) for 4 doses) 4 tablet 0  . cinacalcet (SENSIPAR) 90 MG tablet Take 90 mg by mouth at bedtime.    . DULoxetine (CYMBALTA) 30 MG capsule Take 2 capsules (60 mg total) by mouth daily. (Patient taking differently: Take 60 mg by mouth at bedtime. ) 180 capsule 3  . fluticasone (FLONASE) 50 MCG/ACT nasal spray Place 2 sprays into both nostrils daily as needed for allergies or rhinitis. 16 g 3  . hydrOXYzine (ATARAX/VISTARIL) 25 MG tablet Take 1 tablet (25 mg total) by mouth every 8 (eight) hours as needed for itching. 30 tablet 1  . lidocaine-prilocaine (EMLA) cream Apply 1 application topically See admin instructions. Apply topically one hour before dialysis - Monday, Wednesday, Friday    . loratadine (CLARITIN) 10 MG tablet 10 mg every other day. (Patient taking differently: Take 10 mg by mouth daily as needed for allergies. 10 mg every other day.) 45 tablet 1  . losartan (COZAAR) 100 MG tablet Take 1 tablet (100 mg total) by mouth daily. 90 tablet 1  . metoprolol succinate (TOPROL-XL) 100 MG 24 hr tablet TAKE 1 TABLET (100 MG)  BY MOUTH TWICE DAILY (Patient taking differently: Take 100 mg by mouth 2 (two) times daily. ) 180 tablet 3  . multivitamin (RENA-VIT) TABS tablet Take 1 tablet by mouth at bedtime. (Patient taking differently: Take 1 tablet by mouth daily. ) 60 tablet 5  . oxyCODONE-acetaminophen (PERCOCET) 5-325 MG tablet Take 1 tablet by mouth every 8 (eight) hours as needed for severe pain. (Patient taking differently: Take 0.5-1 tablets by mouth every 8 (eight) hours as needed for severe pain. ) 45 tablet 0  . polyethylene glycol powder (MIRALAX) powder Take 17 g by mouth daily. 255 g 0  . predniSONE (DELTASONE) 20 MG tablet Take 2 tablets (40 mg total)  by mouth daily with breakfast. 4 tablet 0  . pregabalin (LYRICA) 100 MG capsule Take 100 mg by mouth at bedtime.     . sevelamer carbonate (RENVELA) 800 MG tablet Take 2,400 mg by mouth See admin instructions. Take 3 tablets (2400 mg) by mouth with each meal or snack - once or twice daily    . warfarin (COUMADIN) 5 MG tablet Take 5 mg Monday Wednesday Friday and 2.5 mg all other days.check inr 7/29. (Patient taking differently: Take 2.5-5 mg by mouth See admin instructions. Take 1 tablet (5 mg) on Wednesday mornings,  take 1/2 tablet (2.5 mg) on all other mornings) 30 tablet 1  . budesonide-formoterol (SYMBICORT) 160-4.5 MCG/ACT inhaler Inhale 2 puffs into the lungs  2 (two) times daily. 1 Inhaler 12  . glycopyrrolate (ROBINUL) 2 MG tablet Take 1 tablet (2 mg total) by mouth 2 (two) times daily. (Patient not taking: Reported on 01/15/2018) 60 tablet 11  . senna-docusate (SENOKOT-S) 8.6-50 MG tablet Take 1 tablet by mouth 2 (two) times daily.       Assessment: 77 y.o. female admitted 8/16 with pelvic fracture d/t fall. Patient recently diagnosed with a DVT on 12/23/17 and patient reports currently taking warfarin 2.5 mg daily except Wednesday when she takes 5 mg. INR on admission was 2.36.     Goal of Therapy:  INR 2-3 Monitor platelets by anticoagulation protocol: Yes   Plan:  Give warfarin 2.5 mg x 1 tonight  Monitor INR and CBC daily Monitor for s/sx of bleeding  Britt Boozer, PharmD PGY1 Pharmacy Resident Phone: [phone] 01/16/2018,2:30 PM

## 2018-01-16 NOTE — Progress Notes (Signed)
Hypoglycemic Event  CBG: 49  Treatment: D50 IV 50 mL  Symptoms: None  Follow-up CBG: Time:1115 CBG Result:130  Possible Reasons for Event: Inadequate meal intake  Comments/MD notified:Dr. De Blanch, Shyan Scalisi

## 2018-01-16 NOTE — Progress Notes (Signed)
This nurse assuming care of pt.  Pt will not or can't take PO at this time. Pt appears confused when this nurse attempted PO, pt's daughter at bedside and states "pt unable to take PO in emergency department too."  This nurse spoke with attending MD and advised of above.  Awaiting new orders.  [id number]

## 2018-01-16 NOTE — Evaluation (Signed)
Clinical/Bedside Swallow Evaluation Patient Details  Name: [name] MRN: [id number] Date of Birth: 08-09-1940  Today's Date: 01/16/2018 Time: SLP Start Time (ACUTE ONLY): 19 SLP Stop Time (ACUTE ONLY): 1145 SLP Time Calculation (min) (ACUTE ONLY): 22 min  Past Medical History:  Past Medical History:  Diagnosis Date  . Abnormality of gait 03/28/2015  . Adrenal mass ([place])   . 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 Arthritis   . Back pain   . CHF (congestive heart failure) ([place])   . Chronic kidney disease    Hemo MWF  . Congestion of throat    Pt states she has a lot mucus in back throat.  . Constipation   . COPD (chronic obstructive pulmonary disease) ([place])   . Depression   . Dialysis patient [facility])    kidney  . Diverticulitis   . GERD (gastroesophageal reflux disease)   . H/O hiatal hernia   . High cholesterol   . Hoarseness of voice   . Hyperlipidemia   . Hypertension   . IBS (irritable bowel syndrome)   . Meralgia paresthetica of right side 12/26/2014  . Normal cardiac stress test 12/24/2009   lexiscan, imaging normal  . PAD (peripheral artery disease) ([place]) 12/21/2017  . Pneumonia    [id number]  . Renal disorder   . RLS (restless legs syndrome)   . Seizures ([place])    2004 past brain surgery  . Sinus complaint   . Stroke ([place])    TIAs per patient 2 or 3  . Thyroid disease   . TIA (transient ischemic attack)   . Tubular adenoma of colon 01/2008   Past Surgical History:  Past Surgical History:  Procedure Laterality Date  . A/V SHUNT INTERVENTION N/A 08/13/2017   Procedure: A/V SHUNT INTERVENTION;  Surgeon: Algernon Huxley, MD;  Location: [place] [facility];  Service: Cardiovascular;  Laterality: N/A;  . A/V SHUNTOGRAM Left 08/13/2017   Procedure: A/V SHUNTOGRAM;  Surgeon: Algernon Huxley, MD;  Location: [place] CV [facility];  Service: Cardiovascular;  Laterality: Left;  . ABDOMINAL HYSTERECTOMY    . ANAL RECTAL MANOMETRY N/A  07/25/2015   Procedure: ANO RECTAL MANOMETRY;  Surgeon: Mauri Pole, MD;  Location: [facility];  Service: Endoscopy;  Laterality: N/A;  . APPENDECTOMY    . AV FISTULA PLACEMENT Left 11/11/2012   Procedure: INSERTION OF ARTERIOVENOUS (AV) GORE-TEX GRAFT ARM;  Surgeon: Angelia Mould, MD;  Location: [place];  Service: Vascular;  Laterality: Left;  . [place] REMOVAL Left 11/18/2012   Procedure: REMOVAL OF LEFT UPPER ARM ARTERIOVENOUS GORETEX GRAFT ([place]);  Surgeon: Angelia Mould, MD;  Location: [place];  Service: Vascular;  Laterality: Left;  . CARDIAC CATHETERIZATION  2003   normal  . CHOLECYSTECTOMY     Open mid line incision  . frontal craniotomy  2002   indication = sinusitis  . INSERTION OF DIALYSIS CATHETER     Left  . LOWER EXTREMITY ANGIOGRAPHY Right 12/17/2017   Procedure: LOWER EXTREMITY ANGIOGRAPHY;  Surgeon: Algernon Huxley, MD;  Location: [place] [facility];  Service: Cardiovascular;  Laterality: Right;  . NASAL SINUS SURGERY Bilateral 2002  . PATCH ANGIOPLASTY Left 11/18/2012   Procedure: PATCH ANGIOPLASTY;  Surgeon: Angelia Mould, MD;  Location: [place];  Service: Vascular;  Laterality: Left;  . PERIPHERAL VASCULAR CATHETERIZATION Left 03/08/2015   Procedure: A/V Shuntogram/Fistulagram;  Surgeon: Algernon Huxley, MD;  Location: [place]  CV LAB;  Service: Cardiovascular;  Laterality: Left;  . PERIPHERAL VASCULAR CATHETERIZATION Left 03/08/2015   Procedure: A/V Shunt Intervention;  Surgeon: Algernon Huxley, MD;  Location: [place] [facility];  Service: Cardiovascular;  Laterality: Left;  . RECTAL ULTRASOUND N/A 10/05/2015   Procedure: ANAL ULTRASOUND WITH PROBE;  Surgeon: Leighton Ruff, MD;  [facility];  Service: Endoscopy;  Laterality: N/A;  . REVISON OF ARTERIOVENOUS FISTULA  05/11/2012   Procedure: REVISON OF ARTERIOVENOUS FISTULA;  Surgeon: Elam Dutch, MD;  Location: [place];  Service: Vascular;  Laterality: Right;  . TUBAL LIGATION     HPI:   [name] is a 77 y.o. female with history of ESRD, carotid artery disease status post recent right lower extremity stent placement, history of DVT on Coumadin, CHF, chronic anemia who was recently admitted for pneumonia and was discharged home was brought to the ER after patient had 2 falls over the last 24 hours, admitted with hip fracture. MRI brain revealed no acute findings, old cerebellar infarcts; per chart review pt reporting difficulty swallowing. She had OP MBS 09/30/12 due to complaints of globus sensation, coughing/choking spells. MBS revealed normal oropharyngeal swallow function, no ST f/u recommended though w/u for possible LPR was suggested. Pt had EGD with dilation of entire esophagus 11/25/16 due to abnormal barium esophagram on 10/28/16, findings of presbyeophagus.    Assessment / Plan / Recommendation Clinical Impression   Pt's risk for aspiration is primarily due to impaired cognition given current mentation. She is alert, but confused, following some basic commands though requires cuing and redirection from repetitive questioning. She is moaning consistently, and shaking, which results in decreased coordination and labial seal with cup sips of thin liquid, improves with straw sip. Delayed cough x1, with pt holding her hand to her throat, presumably globus sensation. With 2 bites of puree, pt required verbal cues to swallow due to oral holding. Limited POs observed as pt refusing. Recommend clear liquids for now, with medications crushed in pudding or yogurt as pt dislikes applesauce. Pt may use straw, provide only when fully alert, able to tolerate upright position, and preferably when less distracted by pain. Anticipate advancement with improvements in mentation. Will follow up.    SLP Visit Diagnosis: Dysphagia, unspecified (R13.10)    Aspiration Risk  Moderate aspiration risk    Diet Recommendation Thin liquid   Liquid Administration via: Straw Medication Administration:  Crushed with puree(pudding or yogurt) Supervision: Full supervision/cueing for compensatory strategies Compensations: Slow rate;Small sips/bites;Minimize environmental distractions Postural Changes: Seated upright at 90 degrees    Other  Recommendations Oral Care Recommendations: Oral care BID   Follow up Recommendations Other (comment)(tbd)      Frequency and Duration min 2x/week  2 weeks       Prognosis Prognosis for Safe Diet Advancement: Good Barriers to Reach Goals: Cognitive deficits      Swallow Study   General Date of Onset: 01/15/18 HPI: DONETTA ISAZA is a 77 y.o. female with history of ESRD, carotid artery disease status post recent right lower extremity stent placement, history of DVT on Coumadin, CHF, chronic anemia who was recently admitted for pneumonia and was discharged home was brought to the ER after patient had 2 falls over the last 24 hours, admitted with hip fracture. MRI brain revealed no acute findings, old cerebellar infarcts; per chart review pt reporting difficulty swallowing. She had OP MBS 09/30/12 due to complaints of globus sensation, coughing/choking spells. MBS revealed normal oropharyngeal swallow function,  no ST f/u recommended though w/u for possible LPR was suggested. Pt had EGD with dilation of entire esophagus 11/25/16 due to abnormal barium esophagram on 10/28/16, findings of presbyeophagus.  Type of Study: Bedside Swallow Evaluation Previous Swallow Assessment: see HPI Diet Prior to this Study: Regular;Thin liquids Temperature Spikes Noted: No Respiratory Status: Nasal cannula History of Recent Intubation: No Behavior/Cognition: Alert;Confused;Distractible;Requires cueing Oral Cavity Assessment: Dried secretions Oral Care Completed by SLP: Yes Oral Cavity - Dentition: Poor condition;Missing dentition Vision: Functional for self-feeding Self-Feeding Abilities: Needs assist Patient Positioning: Upright in bed Baseline Vocal Quality:  Normal Volitional Cough: Cognitively unable to elicit Volitional Swallow: Unable to elicit    Oral/Motor/Sensory Function Overall Oral Motor/Sensory Function: Within functional limits   Ice Chips Ice chips: Not tested   Thin Liquid Thin Liquid: Impaired Presentation: Cup;Straw Oral Phase Impairments: Reduced labial seal;Poor awareness of bolus Oral Phase Functional Implications: Right anterior spillage Pharyngeal  Phase Impairments: Cough - Delayed    Nectar Thick Nectar Thick Liquid: Not tested   Honey Thick Honey Thick Liquid: Not tested   Puree Puree: Impaired Presentation: Spoon Oral Phase Impairments: Poor awareness of bolus Oral Phase Functional Implications: Prolonged oral transit;Oral holding   Solid     Solid: Not tested     Deneise Lever, MS, CCC-SLP Speech-Language Pathologist [phone]  Aliene Altes 01/16/2018,11:54 AM

## 2018-01-16 NOTE — Progress Notes (Signed)
PROGRESS NOTE    THAILYN KHALID  BWL:[id number] DOB: 08-28-1940 DOA: 01/15/2018 PCP: [place], Betty G, MD   Brief Narrative:  GLINDA NATZKE is a 77 y.o. female with history of ESRD, carotid artery disease status post recent right lower extremity stent placement, history of DVT on Coumadin, CHF, chronic anemia who was recently admitted for pneumonia and was discharged home was brought to the ER after patient had 2 falls over the last 24 hours.  As per the patient's daughter first fall was when patient was with the health aide.  Did not lose consciousness.  Second fall was when patient was alone.  Patient's daughter heard a sound ventral check patient was on the floor.  By the time patient did not lose consciousness.  Did not have any chest pain or shortness of breath.  Had some slurred speech and difficulty speaking he was was called and was brought to the ER.  Assessment & Plan:   Principal Problem:   Fracture of multiple pubic rami (HCC) Active Problems:   Hypertension   ESRD on dialysis ([place])   PAD (peripheral artery disease) ([place])   Long term (current) use of anticoagulants   History of DVT of lower extremity   Closed fracture of multiple pubic rami, right, initial encounter ([place])   1. Fracture of right inferior pubic rami status post fall  1. Scheduled APAP, oxy 2.5 mg prn, morphine 0.5 mg prn.  Also voltaren, lidocaine patch. 2. Follow up T/L spine plain films as pt c/o back pain (points to lower back and TTP of lower back) 3. Per ED discussion with ortho, WBAT, no surgical intervention 4. Consider further imaging if pain persistent or worsening  2. Acute Encephalopathy  Difficulty speaking:  2/2 pain vs missed dialysis, though BUN not that high?  I think pain may be factor.  MRI brain was negative for acute infarct.   1. Discussed with neurology by EDP who recommended MRI (negative for acute stroke) 2. Pain control for #1 3. No evidence of infection, CXR negative, follow  UA 4. Delirium precautions 5. Speech for swallow    3. History of DVT on Coumadin  1. Coumadin per pharmacysince patient failed swallow we will keep patient on heparin until patient passes swallow.  4. Peripheral arterial disease status post recent stent placement on coumadin.  stable  5. ESRD on hemodialysis on Monday Wednesday and Friday missed dialysis yesterday receiving dialysis today  6. Anemia likely from renal disease.  Stable, follow  7. Hypertension continue amlodipine, losartan, metoprolol  8. Gout: continue allopurinol  9. HLD: continue lipitor  DVT prophylaxis: warfarin Code Status: full Family Communication: family at bedside Disposition Plan: pending.  Pt meets inpatient criteria as she's required frequent IV pain meds (as often as q4 hrs)   Consultants:   None at bedisde  Procedures:   none  Antimicrobials:  Anti-infectives (From admission, onward)   None     Subjective: C/o back pain and pain to R leg Stuttering speech.  Repetitive.  Objective: Vitals:   01/15/18 2315 01/16/18 0021 01/16/18 0522 01/16/18 0900  BP: (!) 183/53 (!) 148/55 (!) 139/47   Pulse: 70 63 76   Resp: 17 14 15    Temp:  98.5 F (36.9 C) 98.2 F (36.8 C)   TempSrc:  Oral Oral   SpO2: 98% 100% 100%   Weight:    69.9 kg  Height:        Intake/Output Summary (Last 24 hours) at 01/16/2018 1641 Last  data filed at 01/16/2018 0900 Gross per 24 hour  Intake 0 ml  Output -  Net 0 ml   Filed Weights   01/15/18 1556 01/16/18 0900  Weight: 68.5 kg 69.9 kg    Examination:  General exam: Appears uncomfortable Respiratory system: Clear to auscultation. Respiratory effort normal. Cardiovascular system: S1 & S2 heard, RRR. No JVD, murmurs, rubs, gallops or clicks. No pedal edema. Gastrointestinal system: Abdomen is nondistended, soft and nontender. No organomegaly or masses felt. Normal bowel sounds heard. Central nervous system: Difficult to assess as pt in pain and c/o  persistent pain.  She's repeating words.  Able to follow commands, but does this inconsistently.  CN 2-12 grossly intact, intact upper extremity strength.  Lower extremity exam limited I think due to pain (I observed her standing with PT later).  Extremities: no LEE.  Pain to R knee. Skin: No rashes, lesions or ulcers Psychiatry: Judgement and insight appear normal. Mood & affect appropriate.     Data Reviewed: I have personally reviewed following labs and imaging studies  CBC: Recent Labs  Lab 01/12/18 0415 01/12/18 2222 01/13/18 0824 01/15/18 1708 01/16/18 0014  WBC 8.6 5.3 6.8 8.5 7.8  NEUTROABS  --   --   --  4.5  --   HGB 11.0* 10.2* 10.1* 11.0* 10.4*  HCT 34.5* 32.7* 32.9* 37.8 32.6*  MCV 89.6 89.1 89.9 90.6 88.6  PLT 176 174 189 286 616*   Basic Metabolic Panel: Recent Labs  Lab 01/12/18 0415 01/12/18 2222 01/13/18 0824 01/15/18 1708 01/16/18 0014  NA 140 139 139 137 139  K 3.3* 3.6 4.1 4.5 3.8  CL 96* 97* 101 101 97*  CO2 31 30 28 28 26   GLUCOSE 79 206* 126* 120* 77  BUN 7* 15 19 22  24*  CREATININE 4.63* 6.17* 6.51* 1.34* 7.81*  CALCIUM 8.5* 8.1* 8.3* 9.5 8.2*  PHOS  --  2.5 3.2  --   --    GFR: Estimated Creatinine Clearance: 5.5 mL/min (A) (by C-G formula based on SCr of 7.81 mg/dL (H)). Liver Function Tests: Recent Labs  Lab 01/12/18 2222 01/13/18 0824 01/15/18 1708 01/16/18 0014  AST  --   --  21 32  ALT  --   --  15 15  ALKPHOS  --   --  105 74  BILITOT  --   --  0.4 1.4*  PROT  --   --  7.8 6.1*  ALBUMIN 2.0* 1.9* 3.5 2.1*   No results for input(s): LIPASE, AMYLASE in the last 168 hours. No results for input(s): AMMONIA in the last 168 hours. Coagulation Profile: Recent Labs  Lab 01/12/18 0438 01/13/18 0532 01/15/18 1708 01/16/18 0014  INR 1.92 2.13 1.06 2.36   Cardiac Enzymes: No results for input(s): CKTOTAL, CKMB, CKMBINDEX, TROPONINI in the last 168 hours. BNP (last 3 results) No results for input(s): PROBNP in the last 8760  hours. HbA1C: No results for input(s): HGBA1C in the last 72 hours. CBG: Recent Labs  Lab 01/16/18 1056 01/16/18 1058 01/16/18 1114 01/16/18 1127  GLUCAP 39* 49* 75 130*   Lipid Profile: No results for input(s): CHOL, HDL, LDLCALC, TRIG, CHOLHDL, LDLDIRECT in the last 72 hours. Thyroid Function Tests: No results for input(s): TSH, T4TOTAL, FREET4, T3FREE, THYROIDAB in the last 72 hours. Anemia Panel: No results for input(s): VITAMINB12, FOLATE, FERRITIN, TIBC, IRON, RETICCTPCT in the last 72 hours. Sepsis Labs: No results for input(s): PROCALCITON, LATICACIDVEN in the last 168 hours.  Recent Results (from  the past 240 hour(s))  Culture, sputum-assessment     Status: None   Collection Time: 01/12/18  9:40 PM  Result Value Ref Range Status   Specimen Description EXPECTORATED SPUTUM  Final   Special Requests NONE  Final   Sputum evaluation   Final    THIS SPECIMEN IS ACCEPTABLE FOR SPUTUM CULTURE Performed at [facility], [address] [place], [place] [address]    Report Status 01/13/2018 FINAL  Final  Culture, respiratory     Status: None   Collection Time: 01/12/18  9:40 PM  Result Value Ref Range Status   Specimen Description EXPECTORATED SPUTUM  Final   Special Requests NONE Reflexed from [phone]  Final   Gram Stain   Final    MODERATE WBC PRESENT,BOTH PMN AND MONONUCLEAR NO ORGANISMS SEEN Performed at [facility], [place] [address], [place], [place] [address]    Culture RARE STAPHYLOCOCCUS AUREUS  Final   Report Status 01/16/2018 FINAL  Final   Organism ID, Bacteria STAPHYLOCOCCUS AUREUS  Final      Susceptibility   Staphylococcus aureus - MIC*    CIPROFLOXACIN <=0.5 SENSITIVE Sensitive     ERYTHROMYCIN >=8 RESISTANT Resistant     GENTAMICIN <=0.5 SENSITIVE Sensitive     OXACILLIN <=0.25 SENSITIVE Sensitive     TETRACYCLINE <=1 SENSITIVE Sensitive     VANCOMYCIN <=0.5 SENSITIVE Sensitive     TRIMETH/SULFA <=10 SENSITIVE Sensitive     CLINDAMYCIN  RESISTANT Resistant     RIFAMPIN <=0.5 SENSITIVE Sensitive     Inducible Clindamycin POSITIVE Resistant     * RARE STAPHYLOCOCCUS AUREUS         Radiology Studies: Dg Chest 2 View  Result Date: 01/15/2018 CLINICAL DATA:  Fall leg pain EXAM: CHEST - 2 VIEW COMPARISON:  01/12/2018, 12/21/2017, 08/20/2017 FINDINGS: Left-sided central venous catheter tip projects over the right atrium. Right mid lung opacity has resolved. Chronic elevation of right diaphragm. Low lung volume. Mildly enlarged cardiomediastinal silhouette with aortic atherosclerosis. No pneumothorax. IMPRESSION: No active cardiopulmonary disease.  Cardiomegaly. Electronically Signed   By: Donavan Foil M.D.   On: 01/15/2018 18:36   Dg Thoracic Spine 2 View  Result Date: 01/16/2018 CLINICAL DATA:  Mid back pain. EXAM: THORACIC SPINE 2 VIEWS COMPARISON:  01/15/2018 and prior chest radiographs FINDINGS: No acute fracture or subluxation. Mild multilevel degenerative disc disease in the LOWER thoracic spine again noted. No focal bony lesions are present. A LEFT IJ central venous catheter is again noted. IMPRESSION: 1. No acute abnormality 2. Mild multilevel degenerative changes in the LOWER thoracic spine. Electronically Signed   By: Margarette Canada M.D.   On: 01/16/2018 13:52   Dg Lumbar Spine 2-3 Views  Result Date: 01/16/2018 CLINICAL DATA:  Chronic low back pain. EXAM: LUMBAR SPINE - 2-3 VIEW COMPARISON:  08/20/2017 CT and prior studies FINDINGS: No acute fracture or subluxation noted. Mild to moderate multilevel degenerative disc disease, spondylosis and facet arthropathy noted. No focal bony lesions identified. Aortic atherosclerotic calcifications again noted. IMPRESSION: 1. No acute abnormality 2. Mild to moderate multilevel degenerative changes. 3.  Aortic Atherosclerosis (ICD10-I70.0). Electronically Signed   By: Margarette Canada M.D.   On: 01/16/2018 13:51   Dg Knee 2 Views Right  Result Date: 01/15/2018 CLINICAL DATA:  Fall with  knee pain EXAM: RIGHT KNEE - 1-2 VIEW COMPARISON:  10/05/2003 FINDINGS: Stent in the mid to distal thigh. No fracture or malalignment. Small knee effusion. Mild degenerative change of the medial compartment.  IMPRESSION: No acute osseous abnormality.  Small knee effusion. Electronically Signed   By: Donavan Foil M.D.   On: 01/15/2018 18:39   Ct Head Wo Contrast  Result Date: 01/15/2018 CLINICAL DATA:  77 y/o F; 2 falls today. Word-finding difficulty. Right knee pain. Missed dialysis today. EXAM: CT HEAD WITHOUT CONTRAST CT CERVICAL SPINE WITHOUT CONTRAST TECHNIQUE: Multidetector CT imaging of the head and cervical spine was performed following the standard protocol without intravenous contrast. Multiplanar CT image reconstructions of the cervical spine were also generated. COMPARISON:  12/25/2017 CT head. FINDINGS: CT HEAD FINDINGS Brain: No evidence of acute infarction, hemorrhage, hydrocephalus, extra-axial collection or mass lesion/mass effect. Left anterior frontal stable encephalomalacia. Empty sella turcica. Vascular: Calcific atherosclerosis of carotid siphons. No hyperdense vessel. Skull: Left parietal scalp contusion. No calvarial fracture. Chronic postsurgical changes related to bifrontal craniotomy. Sinuses/Orbits: Left-sided ethmoidectomy and maxillary antrostomy postsurgical changes. Mild mucosal thickening of the paranasal sinuses. Normal aeration of the mastoid air cells. Bilateral intra-ocular lens replacement. Other: None. CT CERVICAL SPINE FINDINGS Alignment: C4-5 and C5-6 grade 1 anterolisthesis. Skull base and vertebrae: No acute fracture. No primary bone lesion or focal pathologic process. Soft tissues and spinal canal: No prevertebral fluid or swelling. No visible canal hematoma. 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 Cervical spondylosis with mild multilevel disc and moderate facet arthrosis. No high-grade bony canal stenosis.  Upper chest: Left subclavian stent partially visualized. Calcific atherosclerosis of the carotid bifurcations and the aorta. Other: 10 mm nodule in the left lobe of the thyroid. IMPRESSION: 1. Left parietal scalp contusion. 2. No calvarial fracture or acute intracranial abnormality. 3. No acute fracture or dislocation of the cervical spine. 4. Stable left anterior frontal lobe encephalomalacia and empty sella turcica. 5. Mild paranasal sinus mucosal thickening. 6. 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 Electronically Signed   By: Kristine Garbe M.D.   On: 01/15/2018 19:08   Ct Cervical Spine Wo Contrast  Result Date: 01/15/2018 CLINICAL DATA:  77 y/o F; 2 falls today. Word-finding difficulty. Right knee pain. Missed dialysis today. EXAM: CT HEAD WITHOUT CONTRAST CT CERVICAL SPINE WITHOUT CONTRAST TECHNIQUE: Multidetector CT imaging of the head and cervical spine was performed following the standard protocol without intravenous contrast. Multiplanar CT image reconstructions of the cervical spine were also generated. COMPARISON:  12/25/2017 CT head. FINDINGS: CT HEAD FINDINGS Brain: No evidence of acute infarction, hemorrhage, hydrocephalus, extra-axial collection or mass lesion/mass effect. Left anterior frontal stable encephalomalacia. Empty sella turcica. Vascular: Calcific atherosclerosis of carotid siphons. No hyperdense vessel. Skull: Left parietal scalp contusion. No calvarial fracture. Chronic postsurgical changes related to bifrontal craniotomy. Sinuses/Orbits: Left-sided ethmoidectomy and maxillary antrostomy postsurgical changes. Mild mucosal thickening of the paranasal sinuses. Normal aeration of the mastoid air cells. Bilateral intra-ocular lens replacement. Other: None. CT CERVICAL SPINE FINDINGS Alignment: C4-5 and C5-6 grade 1 anterolisthesis. Skull base and vertebrae: No acute fracture. No primary bone lesion or focal pathologic  process. Soft tissues and spinal canal: No prevertebral fluid or swelling. No visible canal hematoma. 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 Cervical spondylosis with mild multilevel disc and moderate facet arthrosis. No high-grade bony canal stenosis. Upper chest: Left subclavian stent partially visualized. Calcific atherosclerosis of the carotid bifurcations and the aorta. Other: 10 mm nodule in the left lobe of the thyroid. IMPRESSION: 1. Left parietal scalp contusion. 2. No calvarial fracture or acute intracranial abnormality.  3. No acute fracture or dislocation of the cervical spine. 4. Stable left anterior frontal lobe encephalomalacia and empty sella turcica. 5. Mild paranasal sinus mucosal thickening. 6. 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 Electronically Signed   By: Kristine Garbe M.D.   On: 01/15/2018 19:08   Mr Brain Wo Contrast  Result Date: 01/15/2018 CLINICAL DATA:  Focal neuro deficit, > 6 hrs, stroke suspected. Word-finding difficulty and multiple falls EXAM: MRI HEAD WITHOUT CONTRAST TECHNIQUE: Multiplanar, multiecho pulse sequences of the brain and surrounding structures were obtained without intravenous contrast. COMPARISON:  Head CT 01/15/2018 FINDINGS: The examination had to be discontinued prior to completion due to patient inability to cooperate with the technologist's instructions. The patient was attempting to remove the head coil. There are 9 series provided. Axial T1-weighted imaging and coronal T2-weighted imaging was not obtained. BRAIN: There is no acute infarct, acute hemorrhage or mass effect. Partially empty sella. Left frontal encephalomalacia is unchanged. 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 White matter signal is otherwise normal. The CSF spaces are normal for age, with no hydrocephalus. Susceptibility-sensitive sequences show no chronic  microhemorrhage or superficial siderosis. VASCULAR: Major intracranial arterial and venous sinus flow voids are preserved. SKULL AND UPPER CERVICAL SPINE: Remote bifrontal craniotomy. Large left parietal scalp hematoma. SINUSES/ORBITS: No fluid levels or advanced mucosal thickening. No mastoid or middle ear effusion. There are bilateral lens replacements. IMPRESSION: 1. Truncated examination.  No acute infarct. 2. Left frontal encephalomalacia and multiple punctate, old cerebellar infarcts. 3. Large left parietal scalp hematoma. Electronically Signed   By: Ulyses Jarred M.D.   On: 01/15/2018 22:51   Dg Hips Bilat W Or Wo Pelvis 3-4 Views  Result Date: 01/15/2018 CLINICAL DATA:  Fall with bilateral leg pain EXAM: DG HIP (WITH OR WITHOUT PELVIS) 3-4V BILAT COMPARISON:  CT 08/20/2017 FINDINGS: SI joints are non widened. The pubic symphysis is intact. Possible acute minimally displaced fracture involving the right inferior pubic ramus. Both femoral heads project in joint. Vascular calcifications.  Stent in the right thigh. IMPRESSION: Possible acute minimally displaced fracture involving the right inferior pubic ramus. Otherwise no acute osseous abnormality is seen. Electronically Signed   By: Donavan Foil M.D.   On: 01/15/2018 18:38        Scheduled Meds: . acetaminophen      . allopurinol  100 mg Oral Q T,Th,S,Su  . amLODipine  10 mg Oral QHS  . atorvastatin  40 mg Oral QHS  . Chlorhexidine Gluconate Cloth  6 each Topical Q0600  . cinacalcet  90 mg Oral QHS  . doxercalciferol  1 mcg Intravenous Q M,W,F-HD  . DULoxetine  60 mg Oral QHS  . [START ON 01/17/2018] losartan  100 mg Oral QHS  . [START ON 01/17/2018] metoprolol succinate  100 mg Oral QHS  . mometasone-formoterol  2 puff Inhalation BID  . morphine      . multivitamin  1 tablet Oral QHS  . pregabalin  100 mg Oral QHS  . senna-docusate  1 tablet Oral BID  . sevelamer carbonate  2,400 mg Oral TID WC  . warfarin  2.5 mg Oral Once per day  on Sun Mon Tue Thu Fri Sat   And  . [START ON 01/20/2018] warfarin  5 mg Oral Q Wed-1800  . Warfarin - Pharmacist Dosing Inpatient   Does not apply q1800   Continuous Infusions: . sodium chloride    . sodium chloride       LOS: 0  days    Time spent: over 30 min MDM high with IV pain meds    Fayrene Helper, MD [facility] Pager [phone]  If 7PM-7AM, please contact night-coverage [email or web address] Password [facility] 01/16/2018, 4:41 PM

## 2018-01-16 NOTE — Progress Notes (Signed)
Subjective: Interval History: Falls at home, just d/c 2 d ago.  Pubic ramis fx. Moaning but knows my face.  Hx reviewed.  Objective: Vital signs in last 24 hours: Temp:  [98.2 F (36.8 C)-98.5 F (36.9 C)] 98.2 F (36.8 C) (08/17 0522) Pulse Rate:  [59-80] 76 (08/17 0522) Resp:  [13-31] 15 (08/17 0522) BP: (139-205)/(39-98) 139/47 (08/17 0522) SpO2:  [91 %-100 %] 100 % (08/17 0522) Weight:  [68.5 kg] 68.5 kg (08/16 1556) Weight change:   Intake/Output from previous day: No intake/output data recorded. Intake/Output this shift: No intake/output data recorded.  General appearance: moaning, coop,  Resp: diminished breath sounds bilaterally Cardio: S1, S2 normal and systolic murmur: systolic ejection 2/6, decrescendo at 2nd left intercostal space GI: soft, pos bs, mild distension Extremities: AVF LUA  Lab Results: Recent Labs    01/15/18 1708 01/16/18 0014  WBC 8.5 7.8  HGB 11.0* 10.4*  HCT 37.8 32.6*  PLT 286 143*   BMET:  Recent Labs    01/15/18 1708 01/16/18 0014  NA 137 139  K 4.5 3.8  CL 101 97*  CO2 28 26  GLUCOSE 120* 77  BUN 22 24*  CREATININE 1.34* 7.81*  CALCIUM 9.5 8.2*   No results for input(s): PTH in the last 72 hours. Iron Studies: No results for input(s): IRON, TIBC, TRANSFERRIN, FERRITIN in the last 72 hours.  Studies/Results: Dg Chest 2 View  Result Date: 01/15/2018 CLINICAL DATA:  Fall leg pain EXAM: CHEST - 2 VIEW COMPARISON:  01/12/2018, 12/21/2017, 08/20/2017 FINDINGS: Left-sided central venous catheter tip projects over the right atrium. Right mid lung opacity has resolved. Chronic elevation of right diaphragm. Low lung volume. Mildly enlarged cardiomediastinal silhouette with aortic atherosclerosis. No pneumothorax. IMPRESSION: No active cardiopulmonary disease.  Cardiomegaly. Electronically Signed   By: Donavan Foil M.D.   On: 01/15/2018 18:36   Dg Knee 2 Views Right  Result Date: 01/15/2018 CLINICAL DATA:  Fall with knee pain EXAM:  RIGHT KNEE - 1-2 VIEW COMPARISON:  10/05/2003 FINDINGS: Stent in the mid to distal thigh. No fracture or malalignment. Small knee effusion. Mild degenerative change of the medial compartment. IMPRESSION: No acute osseous abnormality.  Small knee effusion. Electronically Signed   By: Donavan Foil M.D.   On: 01/15/2018 18:39   Ct Head Wo Contrast  Result Date: 01/15/2018 CLINICAL DATA:  77 y/o F; 2 falls today. Word-finding difficulty. Right knee pain. Missed dialysis today. EXAM: CT HEAD WITHOUT CONTRAST CT CERVICAL SPINE WITHOUT CONTRAST TECHNIQUE: Multidetector CT imaging of the head and cervical spine was performed following the standard protocol without intravenous contrast. Multiplanar CT image reconstructions of the cervical spine were also generated. COMPARISON:  12/25/2017 CT head. FINDINGS: CT HEAD FINDINGS Brain: No evidence of acute infarction, hemorrhage, hydrocephalus, extra-axial collection or mass lesion/mass effect. Left anterior frontal stable encephalomalacia. Empty sella turcica. Vascular: Calcific atherosclerosis of carotid siphons. No hyperdense vessel. Skull: Left parietal scalp contusion. No calvarial fracture. Chronic postsurgical changes related to bifrontal craniotomy. Sinuses/Orbits: Left-sided ethmoidectomy and maxillary antrostomy postsurgical changes. Mild mucosal thickening of the paranasal sinuses. Normal aeration of the mastoid air cells. Bilateral intra-ocular lens replacement. Other: None. CT CERVICAL SPINE FINDINGS Alignment: C4-5 and C5-6 grade 1 anterolisthesis. Skull base and vertebrae: No acute fracture. No primary bone lesion or focal pathologic process. Soft tissues and spinal canal: No prevertebral fluid or swelling. No visible canal hematoma. 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 Cervical spondylosis with mild multilevel disc  and moderate facet arthrosis. No high-grade bony canal stenosis. Upper chest: Left  subclavian stent partially visualized. Calcific atherosclerosis of the carotid bifurcations and the aorta. Other: 10 mm nodule in the left lobe of the thyroid. IMPRESSION: 1. Left parietal scalp contusion. 2. No calvarial fracture or acute intracranial abnormality. 3. No acute fracture or dislocation of the cervical spine. 4. Stable left anterior frontal lobe encephalomalacia and empty sella turcica. 5. Mild paranasal sinus mucosal thickening. 6. 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 Electronically Signed   By: Kristine Garbe M.D.   On: 01/15/2018 19:08   Ct Cervical Spine Wo Contrast  Result Date: 01/15/2018 CLINICAL DATA:  77 y/o F; 2 falls today. Word-finding difficulty. Right knee pain. Missed dialysis today. EXAM: CT HEAD WITHOUT CONTRAST CT CERVICAL SPINE WITHOUT CONTRAST TECHNIQUE: Multidetector CT imaging of the head and cervical spine was performed following the standard protocol without intravenous contrast. Multiplanar CT image reconstructions of the cervical spine were also generated. COMPARISON:  12/25/2017 CT head. FINDINGS: CT HEAD FINDINGS Brain: No evidence of acute infarction, hemorrhage, hydrocephalus, extra-axial collection or mass lesion/mass effect. Left anterior frontal stable encephalomalacia. Empty sella turcica. Vascular: Calcific atherosclerosis of carotid siphons. No hyperdense vessel. Skull: Left parietal scalp contusion. No calvarial fracture. Chronic postsurgical changes related to bifrontal craniotomy. Sinuses/Orbits: Left-sided ethmoidectomy and maxillary antrostomy postsurgical changes. Mild mucosal thickening of the paranasal sinuses. Normal aeration of the mastoid air cells. Bilateral intra-ocular lens replacement. Other: None. CT CERVICAL SPINE FINDINGS Alignment: C4-5 and C5-6 grade 1 anterolisthesis. Skull base and vertebrae: No acute fracture. No primary bone lesion or focal pathologic process. Soft  tissues and spinal canal: No prevertebral fluid or swelling. No visible canal hematoma. 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 Cervical spondylosis with mild multilevel disc and moderate facet arthrosis. No high-grade bony canal stenosis. Upper chest: Left subclavian stent partially visualized. Calcific atherosclerosis of the carotid bifurcations and the aorta. Other: 10 mm nodule in the left lobe of the thyroid. IMPRESSION: 1. Left parietal scalp contusion. 2. No calvarial fracture or acute intracranial abnormality. 3. No acute fracture or dislocation of the cervical spine. 4. Stable left anterior frontal lobe encephalomalacia and empty sella turcica. 5. Mild paranasal sinus mucosal thickening. 6. 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 Electronically Signed   By: Kristine Garbe M.D.   On: 01/15/2018 19:08   Mr Brain Wo Contrast  Result Date: 01/15/2018 CLINICAL DATA:  Focal neuro deficit, > 6 hrs, stroke suspected. Word-finding difficulty and multiple falls EXAM: MRI HEAD WITHOUT CONTRAST TECHNIQUE: Multiplanar, multiecho pulse sequences of the brain and surrounding structures were obtained without intravenous contrast. COMPARISON:  Head CT 01/15/2018 FINDINGS: The examination had to be discontinued prior to completion due to patient inability to cooperate with the technologist's instructions. The patient was attempting to remove the head coil. There are 9 series provided. Axial T1-weighted imaging and coronal T2-weighted imaging was not obtained. BRAIN: There is no acute infarct, acute hemorrhage or mass effect. Partially empty sella. Left frontal encephalomalacia is unchanged. 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 White matter signal is otherwise normal. The CSF spaces are normal for age, with no hydrocephalus. Susceptibility-sensitive sequences show no chronic microhemorrhage or  superficial siderosis. VASCULAR: Major intracranial arterial and venous sinus flow voids are preserved. SKULL AND UPPER CERVICAL SPINE: Remote bifrontal craniotomy. Large left parietal scalp hematoma. SINUSES/ORBITS: No fluid levels or advanced mucosal thickening. No  mastoid or middle ear effusion. There are bilateral lens replacements. IMPRESSION: 1. Truncated examination.  No acute infarct. 2. Left frontal encephalomalacia and multiple punctate, old cerebellar infarcts. 3. Large left parietal scalp hematoma. Electronically Signed   By: Ulyses Jarred M.D.   On: 01/15/2018 22:51   Dg Hips Bilat W Or Wo Pelvis 3-4 Views  Result Date: 01/15/2018 CLINICAL DATA:  Fall with bilateral leg pain EXAM: DG HIP (WITH OR WITHOUT PELVIS) 3-4V BILAT COMPARISON:  CT 08/20/2017 FINDINGS: SI joints are non widened. The pubic symphysis is intact. Possible acute minimally displaced fracture involving the right inferior pubic ramus. Both femoral heads project in joint. Vascular calcifications.  Stent in the right thigh. IMPRESSION: Possible acute minimally displaced fracture involving the right inferior pubic ramus. Otherwise no acute osseous abnormality is seen. Electronically Signed   By: Donavan Foil M.D.   On: 01/15/2018 18:38    I have reviewed the patient's current medications.  Assessment/Plan: 1 ESRD will do HD as missed yest.   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 Use hs meds, lower vol 5 PVD. 6 COPD 7 Recent pneu high risk recurrence P HD, esa, bp meds, caution with pain meds    LOS: 0 days   Jeneen Rinks Lyvia Mondesir 01/16/2018,10:23 AM

## 2018-01-16 NOTE — Procedures (Signed)
I was present at this session.  I have reviewed the session itself and made appropriate changes. HD via LUA avf, confusion.  Access press ok   Mauricia Area 8/17/20193:41 PM

## 2018-01-16 NOTE — Evaluation (Signed)
Physical Therapy Evaluation Patient Details Name: [name] MRN: [id number] DOB: [birth year] Today's Date: 01/16/2018   History of Present Illness  Pt is a 77 y.o. F with significant PMH of recent right lower extremity stent placement, history of DVT on Coumadin, CHF, chronic anemia who was recently admitted for pneumonia and discharged home who was brought to ER after 2 falls in last 24 hours. Xray shows right inferior pubic ramus fracture.    Clinical Impression  Pt admitted with above diagnosis. Pt currently with functional limitations due to the deficits listed below (see PT Problem List). Patient with noted 11 admissions to hospital in last 5 months. On previous admission with PT evaluation, recommended discharge to SNF but patient went home. Since that time, patient has fallen twice. On PT evaluation, patient requiring two person maximal assistance to sit edge of bed and transfer from bed to chair using walker. Demonstrates significant imbalance due to bilateral lower extremity tremulousness. Presents as a very high fall risk based on cognitive deficits, decreased safety awareness, history of falls, and generalized weakness. Highly recommending SNF at discharge. Patient daughter seems in agreement. Pt will benefit from skilled PT to increase their independence and safety with mobility to allow discharge to the venue listed below.       Follow Up Recommendations SNF;Supervision/Assistance - 24 hour    Equipment Recommendations  None recommended by PT    Recommendations for Other Services OT consult     Precautions / Restrictions Precautions Precautions: Fall Restrictions Weight Bearing Restrictions: No Other Position/Activity Restrictions: BLE WBAT      Mobility  Bed Mobility Overal bed mobility: Needs Assistance Bed Mobility: Supine to Sit     Supine to sit: Max assist;+2 for physical assistance     General bed mobility comments: Able to move BLE's minimally to assist but  otherwise max assist to progress to EOB  Transfers Overall transfer level: Needs assistance Equipment used: Rolling walker (2 wheeled) Transfers: Sit to/from [facility] Sit to Stand: Max assist;+2 physical assistance Stand pivot transfers: Max assist;+2 physical assistance       General transfer comment: x2 attempts to achieve standing. Patient with increased anxiousness involving mobility due to fear of falling. Max assist to transfer over to recliner with BLE tremulousness and increased knee flexion.    Ambulation/Gait                Stairs            Wheelchair Mobility    Modified Rankin (Stroke Patients Only)       Balance Overall balance assessment: Needs assistance Sitting-balance support: Bilateral upper extremity supported;Feet supported Sitting balance-Leahy Scale: Fair     Standing balance support: Bilateral upper extremity supported;During functional activity Standing balance-Leahy Scale: Poor                               Pertinent Vitals/Pain Pain Assessment: Faces Faces Pain Scale: Hurts even more Pain Location: right knee, back Pain Descriptors / Indicators: Grimacing;Guarding;Moaning Pain Intervention(s): Limited activity within patient's tolerance;Monitored during session    Home Living Family/patient expects to be discharged to:: Private residence Living Arrangements: Other relatives;Children Available Help at Discharge: Family;Available 24 hours/day Type of Home: House Home Access: Stairs to enter   [employer] of Steps: 1 Home Layout: One level Home Equipment: Cane - single point;Walker - 2 wheels      Prior Function Level of Independence: Needs  assistance   Gait / Transfers Assistance Needed: uses cane and walker to ambulate  ADL's / Homemaking Assistance Needed: aide comes 3 times a week to assist with ADL's        Hand Dominance   Dominant Hand: Right    Extremity/Trunk  Assessment   Upper Extremity Assessment Upper Extremity Assessment: Generalized weakness    Lower Extremity Assessment Lower Extremity Assessment: Generalized weakness       Communication   Communication: No difficulties  Cognition Arousal/Alertness: Awake/alert Behavior During Therapy: Anxious Overall Cognitive Status: Impaired/Different from baseline Area of Impairment: Problem solving;Safety/judgement;Attention                 Orientation Level: Disoriented to;Situation;Time Current Attention Level: Sustained Memory: Decreased short-term memory Following Commands: Follows one step commands inconsistently Safety/Judgement: Decreased awareness of safety;Decreased awareness of deficits Awareness: Intellectual Problem Solving: Slow processing General Comments: Patient is moaning consistently and shaking while perseverating on statements i.e. "is my back covered?" Follows simple commands inconsistently with increased time      General Comments      Exercises     Assessment/Plan    PT Assessment Patient needs continued PT services  PT Problem List Decreased strength;Decreased range of motion;Decreased activity tolerance;Decreased mobility;Decreased balance;Decreased cognition;Decreased safety awareness;Pain       PT Treatment Interventions Gait training;DME instruction;Functional mobility training;Therapeutic activities;Therapeutic exercise;Balance training;Patient/family education;Wheelchair mobility training    PT Goals (Current goals can be found in the Care Plan section)  Acute Rehab PT Goals Patient Stated Goal: none stated. pt daughter wants to seek other options for post acute rehab PT Goal Formulation: With patient/family Time For Goal Achievement: 01/30/18 Potential to Achieve Goals: Fair    Frequency Min 3X/week   Barriers to discharge        Co-evaluation               AM-PAC PT "6 Clicks" Daily Activity  Outcome Measure Difficulty  turning over in bed (including adjusting bedclothes, sheets and blankets)?: Unable Difficulty moving from lying on back to sitting on the side of the bed? : Unable Difficulty sitting down on and standing up from a chair with arms (e.g., wheelchair, bedside commode, etc,.)?: Unable Help needed moving to and from a bed to chair (including a wheelchair)?: A Lot Help needed walking in hospital room?: Total Help needed climbing 3-5 steps with a railing? : Total 6 Click Score: 7    End of Session Equipment Utilized During Treatment: Gait belt Activity Tolerance: Patient limited by fatigue Patient left: in chair;with call bell/phone within reach;with family/visitor present Nurse Communication: Mobility status PT Visit Diagnosis: Other abnormalities of gait and mobility (R26.89);Muscle weakness (generalized) (M62.81);Pain Pain - Right/Left: Right Pain - part of body: Leg    Time: 1017-5102 PT Time Calculation (min) (ACUTE ONLY): 31 min   Charges:   PT Evaluation $PT Eval Moderate Complexity: 1 Mod PT Treatments $Therapeutic Activity: 8-22 mins        Ellamae Sia, PT, DPT Acute Rehabilitation Services  Pager: [place] 01/16/2018, 4:00 PM

## 2018-01-17 ENCOUNTER — Inpatient Hospital Stay (HOSPITAL_COMMUNITY): Payer: Medicare Other

## 2018-01-17 LAB — COMPREHENSIVE METABOLIC PANEL
ALT: 17 U/L (ref 0–44)
AST: 28 U/L (ref 15–41)
Albumin: 2 g/dL — ABNORMAL LOW (ref 3.5–5.0)
Alkaline Phosphatase: 82 U/L (ref 38–126)
Anion gap: 9 (ref 5–15)
BILIRUBIN TOTAL: 1 mg/dL (ref 0.3–1.2)
BUN: 10 mg/dL (ref 8–23)
CALCIUM: 8 mg/dL — AB (ref 8.9–10.3)
CO2: 31 mmol/L (ref 22–32)
CREATININE: 4.47 mg/dL — AB (ref 0.44–1.00)
Chloride: 99 mmol/L (ref 98–111)
GFR calc Af Amer: 10 mL/min — ABNORMAL LOW (ref >60)
GFR, EST NON AFRICAN AMERICAN: 9 mL/min — AB (ref >60)
Glucose, Bld: 71 mg/dL (ref 70–99)
Potassium: 4.4 mmol/L (ref 3.5–5.1)
Sodium: 139 mmol/L (ref 135–145)
TOTAL PROTEIN: 5.8 g/dL — AB (ref 6.5–8.1)

## 2018-01-17 LAB — CBC
HEMATOCRIT: 35.3 % — AB (ref 36.0–46.0)
Hemoglobin: 10.9 g/dL — ABNORMAL LOW (ref 12.0–15.0)
MCH: 27.5 pg (ref 26.0–34.0)
MCHC: 30.9 g/dL (ref 30.0–36.0)
MCV: 89.1 fL (ref 78.0–100.0)
Platelets: 146 10*3/uL — ABNORMAL LOW (ref 150–400)
RBC: 3.96 MIL/uL (ref 3.87–5.11)
RDW: 16.9 % — ABNORMAL HIGH (ref 11.5–15.5)
WBC: 12.8 10*3/uL — AB (ref 4.0–10.5)

## 2018-01-17 LAB — PROTIME-INR
INR: 1.91
Prothrombin Time: 21.7 seconds — ABNORMAL HIGH (ref 11.4–15.2)

## 2018-01-17 LAB — MAGNESIUM: MAGNESIUM: 2.1 mg/dL (ref 1.7–2.4)

## 2018-01-17 LAB — HEPARIN LEVEL (UNFRACTIONATED)

## 2018-01-17 MED ORDER — HEPARIN (PORCINE) IN NACL 100-0.45 UNIT/ML-% IJ SOLN
900.0000 [IU]/h | INTRAMUSCULAR | Status: DC
Start: 2018-01-17 — End: 2018-01-18
  Administered 2018-01-17 – 2018-01-18 (×2): 900 [IU]/h via INTRAVENOUS
  Filled 2018-01-17 (×2): qty 250

## 2018-01-17 MED ORDER — CHLORHEXIDINE GLUCONATE CLOTH 2 % EX PADS
6.0000 | MEDICATED_PAD | Freq: Every day | CUTANEOUS | Status: DC
  Administered 2018-01-17: 6 via TOPICAL

## 2018-01-17 MED ORDER — POLYETHYLENE GLYCOL 3350 17 G PO PACK
17.0000 g | PACK | Freq: Every day | ORAL | Status: DC
  Administered 2018-01-17 – 2018-01-18 (×2): 17 g via ORAL
  Filled 2018-01-17 (×6): qty 1

## 2018-01-17 MED ORDER — AMLODIPINE BESYLATE 5 MG PO TABS
5.0000 mg | ORAL_TABLET | Freq: Every day | ORAL | Status: DC
  Administered 2018-01-17 – 2018-01-21 (×5): 5 mg via ORAL
  Filled 2018-01-17 (×6): qty 1

## 2018-01-17 NOTE — Progress Notes (Signed)
SLP Cancellation Note  Patient Details Name: QUINTERIA CHISUM MRN: [id number] DOB: [birth year]   Cancelled treatment:       Reason Eval/Treat Not Completed: Patient at procedure or test/unavailable. Pt off the floor for MRI, will continue efforts.  Deneise Lever, [place], [place] Speech-Language Pathologist [place] 01/17/2018, 1:19 PM

## 2018-01-17 NOTE — Progress Notes (Signed)
PROGRESS NOTE    [name]  NUU:[id number] DOB: 21-Nov-1940 DOA: 01/15/2018 PCP: [place], Betty G, MD   Brief Narrative:  [name] is [name] 77 y.o. female with history of ESRD, carotid artery disease status post recent right lower extremity stent placement, history of DVT on Coumadin, CHF, chronic anemia who was recently admitted for pneumonia and was discharged home was brought to the ER after patient had 2 falls over the last 24 hours.  As per the patient's daughter first fall was when patient was with the health aide.  Did not lose consciousness.  Second fall was when patient was alone.  Patient's daughter heard [name] sound ventral check patient was on the floor.  By the time patient did not lose consciousness.  Did not have any chest pain or shortness of breath.  Had some slurred speech and difficulty speaking he was was called and was brought to the ER.  Assessment & Plan:   Principal Problem:   Fracture of multiple pubic rami (HCC) Active Problems:   Hypertension   ESRD on dialysis ([place])   PAD (peripheral artery disease) ([place])   Long term (current) use of anticoagulants   History of DVT of lower extremity   Closed fracture of multiple pubic rami, right, initial encounter ([place])   Fall   1. Fracture of right inferior pubic rami status post fall  1. Scheduled APAP, oxy 2.5 mg prn, morphine 0.5 mg prn.  Also voltaren, lidocaine patch. 2. Follow up T/L spine plain films as pt c/o back pain (with degenerative changes) 3. Per ED discussion with ortho, WBAT, no surgical intervention 4. Will f/u MRI pelvis  2. Acute Encephalopathy  Difficulty speaking:  2/2 pain vs missed dialysis, though BUN not that high?  I think pain may be factor. This is improved today.  She's still slightly confused, but [name]&Ox3 and much better overall. 1. Discussed with neurology by EDP who recommended MRI (negative for acute stroke) 2. Pain control for #1 3. No evidence of infection, CXR negative, follow  UA 4. Delirium precautions 5. Speech for swallow -> currently thin liquid, follow speech recs    3. RLE weakness:  I think this is most likely 2/2 pain, but pt unable to describe this well for me and also c/o difference in sensation b/n both legs (right more sensitive than L).  Will follow up MRI L spine and continue to monitor.  4. History of DVT on Coumadin  1. Coumadin per pharmacy 2. Heparin while INR subtherapeutic  5. Peripheral arterial disease status post recent stent placement on coumadin.  stable  6. ESRD on hemodialysis on Monday Wednesday and Friday 1. Renal following, appreciate assistance  7. Anemia likely from renal disease.  Stable, follow  8. Leukocytosis: mild, follow  9. Hypertension continue amlodipine, losartan, metoprolol  10. Gout: continue allopurinol  11. HLD: continue lipitor  12. Thrombocytopenia: mild, follow  DVT prophylaxis: warfarin Code Status: full Family Communication: family at bedside Disposition Plan: pending further imaging and SNF placement   Consultants:   None at bedisde  Procedures:   none  Antimicrobials:  Anti-infectives (From admission, onward)   None     Subjective: C/o pain to lower back. [name]&Ox3.  Feeling better overall, still [name] bit confused.  Objective: Vitals:   01/16/18 1800 01/16/18 1845 01/16/18 2039 01/17/18 0628  BP: (!) 124/59 (!) 133/57 (!) 128/47 (!) 146/49  Pulse: (!) 113 (!) 103 79 87  Resp: (!) 26 (!) 22 (!) 22 (!)  21  Temp:  97.8 F (36.6 C) 98.2 F (36.8 C) 97.9 F (36.6 C)  TempSrc:  Oral  Oral  SpO2:  98% (!) 86% 100%  Weight:  67.1 kg    Height:        Intake/Output Summary (Last 24 hours) at 01/17/2018 0947 Last data filed at 01/16/2018 1826 Gross per 24 hour  Intake -  Output 2444 ml  Net -2444 ml   Filed Weights   01/16/18 0900 01/16/18 1405 01/16/18 1845  Weight: 69.9 kg 69.8 kg 67.1 kg    Examination:  General: No acute distress. Cardiovascular: Heart sounds show [name]  regular rate, and rhythm.  Lungs: Clear to auscultation bilaterally with good air movement.  Abdomen: Soft, nontender, nondistended  Neurological: Alert and oriented 3. Moves all extremities 4. 4/5 strength to RLE (unable to tell if this is due to pain, she can't describe well to me if she's having pain with this).  Increases paresthesias on R compared to L.  Cranial nerves II through XII intact. Skin: Warm and dry. No rashes or lesions. Extremities: No clubbing or cyanosis. No edema.  Psychiatric: Mood and affect are normal. Insight and judgment are appropriate.    Data Reviewed: I have personally reviewed following labs and imaging studies  CBC: Recent Labs  Lab 01/12/18 2222 01/13/18 0824 01/15/18 1708 01/16/18 0014 01/17/18 0308  WBC 5.3 6.8 8.5 7.8 12.8*  NEUTROABS  --   --  4.5  --   --   HGB 10.2* 10.1* 11.0* 10.4* 10.9*  HCT 32.7* 32.9* 37.8 32.6* 35.3*  MCV 89.1 89.9 90.6 88.6 89.1  PLT 174 189 286 143* 465*   Basic Metabolic Panel: Recent Labs  Lab 01/12/18 2222 01/13/18 0824 01/15/18 1708 01/16/18 0014 01/17/18 0308  NA 139 139 137 139 139  K 3.6 4.1 4.5 3.8 4.4  CL 97* 101 101 97* 99  CO2 30 28 28 26 31   GLUCOSE 206* 126* 120* 77 71  BUN 15 19 22  24* 10  CREATININE 6.17* 6.51* 1.34* 7.81* 4.47*  CALCIUM 8.1* 8.3* 9.5 8.2* 8.0*  MG  --   --   --   --  2.1  PHOS 2.5 3.2  --   --   --    GFR: Estimated Creatinine Clearance: 9.4 mL/min ([name]) (by C-G formula based on SCr of 4.47 mg/dL (H)). Liver Function Tests: Recent Labs  Lab 01/12/18 2222 01/13/18 0824 01/15/18 1708 01/16/18 0014 01/17/18 0308  AST  --   --  21 32 28  ALT  --   --  15 15 17   ALKPHOS  --   --  105 74 82  BILITOT  --   --  0.4 1.4* 1.0  PROT  --   --  7.8 6.1* 5.8*  ALBUMIN 2.0* 1.9* 3.5 2.1* 2.0*   No results for input(s): LIPASE, AMYLASE in the last 168 hours. No results for input(s): AMMONIA in the last 168 hours. Coagulation Profile: Recent Labs  Lab 01/12/18 0438  01/13/18 0532 01/15/18 1708 01/16/18 0014 01/17/18 0308  INR 1.92 2.13 1.06 2.36 1.91   Cardiac Enzymes: No results for input(s): CKTOTAL, CKMB, CKMBINDEX, TROPONINI in the last 168 hours. BNP (last 3 results) No results for input(s): PROBNP in the last 8760 hours. HbA1C: No results for input(s): HGBA1C in the last 72 hours. CBG: Recent Labs  Lab 01/16/18 1056 01/16/18 1058 01/16/18 1114 01/16/18 1127  GLUCAP 39* 49* 75 130*   Lipid Profile: No results  for input(s): CHOL, HDL, LDLCALC, TRIG, CHOLHDL, LDLDIRECT in the last 72 hours. Thyroid Function Tests: No results for input(s): TSH, T4TOTAL, FREET4, T3FREE, THYROIDAB in the last 72 hours. Anemia Panel: No results for input(s): VITAMINB12, FOLATE, FERRITIN, TIBC, IRON, RETICCTPCT in the last 72 hours. Sepsis Labs: No results for input(s): PROCALCITON, LATICACIDVEN in the last 168 hours.  Recent Results (from the past 240 hour(s))  Culture, sputum-assessment     Status: None   Collection Time: 01/12/18  9:40 PM  Result Value Ref Range Status   Specimen Description EXPECTORATED SPUTUM  Final   Special Requests NONE  Final   Sputum evaluation   Final    THIS SPECIMEN IS ACCEPTABLE FOR SPUTUM CULTURE Performed at [facility], [address], [place], [place] [address]    Report Status 01/13/2018 FINAL  Final  Culture, respiratory     Status: None   Collection Time: 01/12/18  9:40 PM  Result Value Ref Range Status   Specimen Description EXPECTORATED SPUTUM  Final   Special Requests NONE Reflexed from [id number]  Final   Gram Stain   Final    MODERATE WBC PRESENT,BOTH PMN AND MONONUCLEAR NO ORGANISMS SEEN Performed at [facility], [employer] [address], [place], [place] [address]    Culture RARE STAPHYLOCOCCUS AUREUS  Final   Report Status 01/16/2018 FINAL  Final   Organism ID, Bacteria STAPHYLOCOCCUS AUREUS  Final      Susceptibility   Staphylococcus aureus - MIC*    CIPROFLOXACIN <=0.5 SENSITIVE Sensitive      ERYTHROMYCIN >=8 RESISTANT Resistant     GENTAMICIN <=0.5 SENSITIVE Sensitive     OXACILLIN <=0.25 SENSITIVE Sensitive     TETRACYCLINE <=1 SENSITIVE Sensitive     VANCOMYCIN <=0.5 SENSITIVE Sensitive     TRIMETH/SULFA <=10 SENSITIVE Sensitive     CLINDAMYCIN RESISTANT Resistant     RIFAMPIN <=0.5 SENSITIVE Sensitive     Inducible Clindamycin POSITIVE Resistant     * RARE STAPHYLOCOCCUS AUREUS         Radiology Studies: Dg Chest 2 View  Result Date: 01/15/2018 CLINICAL DATA:  Fall leg pain EXAM: CHEST - 2 VIEW COMPARISON:  01/12/2018, 12/21/2017, 08/20/2017 FINDINGS: Left-sided central venous catheter tip projects over the right atrium. Right mid lung opacity has resolved. Chronic elevation of right diaphragm. Low lung volume. Mildly enlarged cardiomediastinal silhouette with aortic atherosclerosis. No pneumothorax. IMPRESSION: No active cardiopulmonary disease.  Cardiomegaly. Electronically Signed   By: Donavan Foil M.D.   On: 01/15/2018 18:36   Dg Thoracic Spine 2 View  Result Date: 01/16/2018 CLINICAL DATA:  Mid back pain. EXAM: THORACIC SPINE 2 VIEWS COMPARISON:  01/15/2018 and prior chest radiographs FINDINGS: No acute fracture or subluxation. Mild multilevel degenerative disc disease in the LOWER thoracic spine again noted. No focal bony lesions are present. Benyamin Jeff LEFT IJ central venous catheter is again noted. IMPRESSION: 1. No acute abnormality 2. Mild multilevel degenerative changes in the LOWER thoracic spine. Electronically Signed   By: Margarette Canada M.D.   On: 01/16/2018 13:52   Dg Lumbar Spine 2-3 Views  Result Date: 01/16/2018 CLINICAL DATA:  Chronic low back pain. EXAM: LUMBAR SPINE - 2-3 VIEW COMPARISON:  08/20/2017 CT and prior studies FINDINGS: No acute fracture or subluxation noted. Mild to moderate multilevel degenerative disc disease, spondylosis and facet arthropathy noted. No focal bony lesions identified. Aortic atherosclerotic calcifications again noted. IMPRESSION: 1.  No acute abnormality 2. Mild to moderate multilevel degenerative changes. 3.  Aortic Atherosclerosis (  ICD10-I70.0). Electronically Signed   By: Margarette Canada M.D.   On: 01/16/2018 13:51   Dg Knee 2 Views Right  Result Date: 01/15/2018 CLINICAL DATA:  Fall with knee pain EXAM: RIGHT KNEE - 1-2 VIEW COMPARISON:  10/05/2003 FINDINGS: Stent in the mid to distal thigh. No fracture or malalignment. Small knee effusion. Mild degenerative change of the medial compartment. IMPRESSION: No acute osseous abnormality.  Small knee effusion. Electronically Signed   By: Donavan Foil M.D.   On: 01/15/2018 18:39   Ct Head Wo Contrast  Result Date: 01/15/2018 CLINICAL DATA:  76 y/o F; 2 falls today. Word-finding difficulty. Right knee pain. Missed dialysis today. EXAM: CT HEAD WITHOUT CONTRAST CT CERVICAL SPINE WITHOUT CONTRAST TECHNIQUE: Multidetector CT imaging of the head and cervical spine was performed following the standard protocol without intravenous contrast. Multiplanar CT image reconstructions of the cervical spine were also generated. COMPARISON:  12/25/2017 CT head. FINDINGS: CT HEAD FINDINGS Brain: No evidence of acute infarction, hemorrhage, hydrocephalus, extra-axial collection or mass lesion/mass effect. Left anterior frontal stable encephalomalacia. Empty sella turcica. Vascular: Calcific atherosclerosis of carotid siphons. No hyperdense vessel. Skull: Left parietal scalp contusion. No calvarial fracture. Chronic postsurgical changes related to bifrontal craniotomy. Sinuses/Orbits: Left-sided ethmoidectomy and maxillary antrostomy postsurgical changes. Mild mucosal thickening of the paranasal sinuses. Normal aeration of the mastoid air cells. Bilateral intra-ocular lens replacement. Other: None. CT CERVICAL SPINE FINDINGS Alignment: C4-5 and C5-6 grade 1 anterolisthesis. Skull base and vertebrae: No acute fracture. No primary bone lesion or focal pathologic process. Soft tissues and spinal canal: No  prevertebral fluid or swelling. No visible canal hematoma. 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 Cervical spondylosis with mild multilevel disc and moderate facet arthrosis. No high-grade bony canal stenosis. Upper chest: Left subclavian stent partially visualized. Calcific atherosclerosis of the carotid bifurcations and the aorta. Other: 10 mm nodule in the left lobe of the thyroid. IMPRESSION: 1. Left parietal scalp contusion. 2. No calvarial fracture or acute intracranial abnormality. 3. No acute fracture or dislocation of the cervical spine. 4. Stable left anterior frontal lobe encephalomalacia and empty sella turcica. 5. Mild paranasal sinus mucosal thickening. 6. 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 Electronically Signed   By: Kristine Garbe M.D.   On: 01/15/2018 19:08   Ct Cervical Spine Wo Contrast  Result Date: 01/15/2018 CLINICAL DATA:  77 y/o F; 2 falls today. Word-finding difficulty. Right knee pain. Missed dialysis today. EXAM: CT HEAD WITHOUT CONTRAST CT CERVICAL SPINE WITHOUT CONTRAST TECHNIQUE: Multidetector CT imaging of the head and cervical spine was performed following the standard protocol without intravenous contrast. Multiplanar CT image reconstructions of the cervical spine were also generated. COMPARISON:  12/25/2017 CT head. FINDINGS: CT HEAD FINDINGS Brain: No evidence of acute infarction, hemorrhage, hydrocephalus, extra-axial collection or mass lesion/mass effect. Left anterior frontal stable encephalomalacia. Empty sella turcica. Vascular: Calcific atherosclerosis of carotid siphons. No hyperdense vessel. Skull: Left parietal scalp contusion. No calvarial fracture. Chronic postsurgical changes related to bifrontal craniotomy. Sinuses/Orbits: Left-sided ethmoidectomy and maxillary antrostomy postsurgical changes. Mild mucosal thickening of the paranasal  sinuses. Normal aeration of the mastoid air cells. Bilateral intra-ocular lens replacement. Other: None. CT CERVICAL SPINE FINDINGS Alignment: C4-5 and C5-6 grade 1 anterolisthesis. Skull base and vertebrae: No acute fracture. No primary bone lesion or focal pathologic process. Soft tissues and spinal canal: No prevertebral fluid or swelling. No visible canal hematoma. 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 Cervical spondylosis with mild multilevel disc and moderate facet arthrosis. No high-grade bony canal stenosis. Upper chest: Left subclavian stent partially visualized. Calcific atherosclerosis of the carotid bifurcations and the aorta. Other: 10 mm nodule in the left lobe of the thyroid. IMPRESSION: 1. Left parietal scalp contusion. 2. No calvarial fracture or acute intracranial abnormality. 3. No acute fracture or dislocation of the cervical spine. 4. Stable left anterior frontal lobe encephalomalacia and empty sella turcica. 5. Mild paranasal sinus mucosal thickening. 6. 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 Electronically Signed   By: Kristine Garbe M.D.   On: 01/15/2018 19:08   Mr Brain Wo Contrast  Result Date: 01/15/2018 CLINICAL DATA:  Focal neuro deficit, > 6 hrs, stroke suspected. Word-finding difficulty and multiple falls EXAM: MRI HEAD WITHOUT CONTRAST TECHNIQUE: Multiplanar, multiecho pulse sequences of the brain and surrounding structures were obtained without intravenous contrast. COMPARISON:  Head CT 01/15/2018 FINDINGS: The examination had to be discontinued prior to completion due to patient inability to cooperate with the technologist's instructions. The patient was attempting to remove the head coil. There are 9 series provided. Axial T1-weighted imaging and coronal T2-weighted imaging was not obtained. BRAIN: There is no acute infarct, acute hemorrhage or mass  effect. Partially empty sella. Left frontal encephalomalacia is unchanged. 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 White matter signal is otherwise normal. The CSF spaces are normal for age, with no hydrocephalus. Susceptibility-sensitive sequences show no chronic microhemorrhage or superficial siderosis. VASCULAR: Major intracranial arterial and venous sinus flow voids are preserved. SKULL AND UPPER CERVICAL SPINE: Remote bifrontal craniotomy. Large left parietal scalp hematoma. SINUSES/ORBITS: No fluid levels or advanced mucosal thickening. No mastoid or middle ear effusion. There are bilateral lens replacements. IMPRESSION: 1. Truncated examination.  No acute infarct. 2. Left frontal encephalomalacia and multiple punctate, old cerebellar infarcts. 3. Large left parietal scalp hematoma. Electronically Signed   By: Ulyses Jarred M.D.   On: 01/15/2018 22:51   Dg Hips Bilat W Or Wo Pelvis 3-4 Views  Result Date: 01/15/2018 CLINICAL DATA:  Fall with bilateral leg pain EXAM: DG HIP (WITH OR WITHOUT PELVIS) 3-4V BILAT COMPARISON:  CT 08/20/2017 FINDINGS: SI joints are non widened. The pubic symphysis is intact. Possible acute minimally displaced fracture involving the right inferior pubic ramus. Both femoral heads project in joint. Vascular calcifications.  Stent in the right thigh. IMPRESSION: Possible acute minimally displaced fracture involving the right inferior pubic ramus. Otherwise no acute osseous abnormality is seen. Electronically Signed   By: Donavan Foil M.D.   On: 01/15/2018 18:38        Scheduled Meds: . acetaminophen  1,000 mg Oral Q8H  . allopurinol  100 mg Oral Q T,Th,S,Su  . amLODipine  5 mg Oral QHS  . atorvastatin  40 mg Oral QHS  . Chlorhexidine Gluconate Cloth  6 each Topical Q0600  . cinacalcet  90 mg Oral QHS  . diclofenac sodium  2 g Topical QID  . doxercalciferol  1 mcg Intravenous Q M,W,F-HD  . DULoxetine  60 mg Oral QHS  . lidocaine  1 patch Transdermal Q24H  . losartan   100 mg Oral QHS  . metoprolol succinate  100 mg Oral QHS  . mometasone-formoterol  2 puff Inhalation BID  . multivitamin  1 tablet Oral QHS  . pregabalin  100 mg Oral QHS  . senna-docusate  1 tablet Oral BID  . sevelamer carbonate  2,400 mg  Oral TID WC  . warfarin  2.5 mg Oral Once per day on Sun Mon Tue Thu Fri Sat   And  . [START ON 01/20/2018] warfarin  5 mg Oral Q Wed-1800  . Warfarin - Pharmacist Dosing Inpatient   Does not apply q1800   Continuous Infusions: . sodium chloride    . sodium chloride       LOS: 1 day    Time spent: over 30 min MDM moderate with multiple medical issues    Fayrene Helper, MD [facility] Pager [phone]  If 7PM-7AM, please contact night-coverage [email or web address] Password [email or web address] 01/17/2018, 9:47 AM

## 2018-01-17 NOTE — Progress Notes (Signed)
Subjective: Interval History: has complaints,sore but feels better.  Objective: Vital signs in last 24 hours: Temp:  [97.8 F (36.6 C)-98.2 F (36.8 C)] 97.9 F (36.6 C) (08/18 0628) Pulse Rate:  [79-113] 87 (08/18 0628) Resp:  [21-30] 21 (08/18 0628) BP: (105-177)/(43-62) 146/49 (08/18 0628) SpO2:  [86 %-100 %] 100 % (08/18 0628) Weight:  [67.1 kg-69.8 kg] 67.1 kg (08/17 1845) Weight change: 1.407 kg  Intake/Output from previous day: 08/17 0701 - 08/18 0700 In: 0  Out: 2444  Intake/Output this shift: No intake/output data recorded.  General appearance: alert, cooperative, moderately obese and Ox2 Resp: diminished breath sounds bilaterally Cardio: S1, S2 normal and systolic murmur: systolic ejection 2/6, crescendo and decrescendo at 2nd left intercostal space GI: soft, non-tender; bowel sounds normal; no masses,  no organomegaly Extremities: AVF LUA, , tender R thigh pelvis  Lab Results: Recent Labs    01/16/18 0014 01/17/18 0308  WBC 7.8 12.8*  HGB 10.4* 10.9*  HCT 32.6* 35.3*  PLT 143* 146*   BMET:  Recent Labs    01/16/18 0014 01/17/18 0308  NA 139 139  K 3.8 4.4  CL 97* 99  CO2 26 31  GLUCOSE 77 71  BUN 24* 10  CREATININE 7.81* 4.47*  CALCIUM 8.2* 8.0*   No results for input(s): PTH in the last 72 hours. Iron Studies: No results for input(s): IRON, TIBC, TRANSFERRIN, FERRITIN in the last 72 hours.  Studies/Results: Dg Chest 2 View  Result Date: 01/15/2018 CLINICAL DATA:  Fall leg pain EXAM: CHEST - 2 VIEW COMPARISON:  01/12/2018, 12/21/2017, 08/20/2017 FINDINGS: Left-sided central venous catheter tip projects over the right atrium. Right mid lung opacity has resolved. Chronic elevation of right diaphragm. Low lung volume. Mildly enlarged cardiomediastinal silhouette with aortic atherosclerosis. No pneumothorax. IMPRESSION: No active cardiopulmonary disease.  Cardiomegaly. Electronically Signed   By: Donavan Foil M.D.   On: 01/15/2018 18:36   Dg  Thoracic Spine 2 View  Result Date: 01/16/2018 CLINICAL DATA:  Mid back pain. EXAM: THORACIC SPINE 2 VIEWS COMPARISON:  01/15/2018 and prior chest radiographs FINDINGS: No acute fracture or subluxation. Mild multilevel degenerative disc disease in the LOWER thoracic spine again noted. No focal bony lesions are present. A LEFT IJ central venous catheter is again noted. IMPRESSION: 1. No acute abnormality 2. Mild multilevel degenerative changes in the LOWER thoracic spine. Electronically Signed   By: Margarette Canada M.D.   On: 01/16/2018 13:52   Dg Lumbar Spine 2-3 Views  Result Date: 01/16/2018 CLINICAL DATA:  Chronic low back pain. EXAM: LUMBAR SPINE - 2-3 VIEW COMPARISON:  08/20/2017 CT and prior studies FINDINGS: No acute fracture or subluxation noted. Mild to moderate multilevel degenerative disc disease, spondylosis and facet arthropathy noted. No focal bony lesions identified. Aortic atherosclerotic calcifications again noted. IMPRESSION: 1. No acute abnormality 2. Mild to moderate multilevel degenerative changes. 3.  Aortic Atherosclerosis (ICD10-I70.0). Electronically Signed   By: Margarette Canada M.D.   On: 01/16/2018 13:51   Dg Knee 2 Views Right  Result Date: 01/15/2018 CLINICAL DATA:  Fall with knee pain EXAM: RIGHT KNEE - 1-2 VIEW COMPARISON:  10/05/2003 FINDINGS: Stent in the mid to distal thigh. No fracture or malalignment. Small knee effusion. Mild degenerative change of the medial compartment. IMPRESSION: No acute osseous abnormality.  Small knee effusion. Electronically Signed   By: Donavan Foil M.D.   On: 01/15/2018 18:39   Ct Head Wo Contrast  Result Date: 01/15/2018 CLINICAL DATA:  77 y/o F; 2 falls today.  Word-finding difficulty. Right knee pain. Missed dialysis today. EXAM: CT HEAD WITHOUT CONTRAST CT CERVICAL SPINE WITHOUT CONTRAST TECHNIQUE: Multidetector CT imaging of the head and cervical spine was performed following the standard protocol without intravenous contrast. Multiplanar CT  image reconstructions of the cervical spine were also generated. COMPARISON:  12/25/2017 CT head. FINDINGS: CT HEAD FINDINGS Brain: No evidence of acute infarction, hemorrhage, hydrocephalus, extra-axial collection or mass lesion/mass effect. Left anterior frontal stable encephalomalacia. Empty sella turcica. Vascular: Calcific atherosclerosis of carotid siphons. No hyperdense vessel. Skull: Left parietal scalp contusion. No calvarial fracture. Chronic postsurgical changes related to bifrontal craniotomy. Sinuses/Orbits: Left-sided ethmoidectomy and maxillary antrostomy postsurgical changes. Mild mucosal thickening of the paranasal sinuses. Normal aeration of the mastoid air cells. Bilateral intra-ocular lens replacement. Other: None. CT CERVICAL SPINE FINDINGS Alignment: C4-5 and C5-6 grade 1 anterolisthesis. Skull base and vertebrae: No acute fracture. No primary bone lesion or focal pathologic process. Soft tissues and spinal canal: No prevertebral fluid or swelling. No visible canal hematoma. 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 Cervical spondylosis with mild multilevel disc and moderate facet arthrosis. No high-grade bony canal stenosis. Upper chest: Left subclavian stent partially visualized. Calcific atherosclerosis of the carotid bifurcations and the aorta. Other: 10 mm nodule in the left lobe of the thyroid. IMPRESSION: 1. Left parietal scalp contusion. 2. No calvarial fracture or acute intracranial abnormality. 3. No acute fracture or dislocation of the cervical spine. 4. Stable left anterior frontal lobe encephalomalacia and empty sella turcica. 5. Mild paranasal sinus mucosal thickening. 6. 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 Electronically Signed   By: Kristine Garbe M.D.   On: 01/15/2018 19:08   Ct Cervical Spine Wo Contrast  Result Date: 01/15/2018 CLINICAL DATA:  77  y/o F; 2 falls today. Word-finding difficulty. Right knee pain. Missed dialysis today. EXAM: CT HEAD WITHOUT CONTRAST CT CERVICAL SPINE WITHOUT CONTRAST TECHNIQUE: Multidetector CT imaging of the head and cervical spine was performed following the standard protocol without intravenous contrast. Multiplanar CT image reconstructions of the cervical spine were also generated. COMPARISON:  12/25/2017 CT head. FINDINGS: CT HEAD FINDINGS Brain: No evidence of acute infarction, hemorrhage, hydrocephalus, extra-axial collection or mass lesion/mass effect. Left anterior frontal stable encephalomalacia. Empty sella turcica. Vascular: Calcific atherosclerosis of carotid siphons. No hyperdense vessel. Skull: Left parietal scalp contusion. No calvarial fracture. Chronic postsurgical changes related to bifrontal craniotomy. Sinuses/Orbits: Left-sided ethmoidectomy and maxillary antrostomy postsurgical changes. Mild mucosal thickening of the paranasal sinuses. Normal aeration of the mastoid air cells. Bilateral intra-ocular lens replacement. Other: None. CT CERVICAL SPINE FINDINGS Alignment: C4-5 and C5-6 grade 1 anterolisthesis. Skull base and vertebrae: No acute fracture. No primary bone lesion or focal pathologic process. Soft tissues and spinal canal: No prevertebral fluid or swelling. No visible canal hematoma. 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 Cervical spondylosis with mild multilevel disc and moderate facet arthrosis. No high-grade bony canal stenosis. Upper chest: Left subclavian stent partially visualized. Calcific atherosclerosis of the carotid bifurcations and the aorta. Other: 10 mm nodule in the left lobe of the thyroid. IMPRESSION: 1. Left parietal scalp contusion. 2. No calvarial fracture or acute intracranial abnormality. 3. No acute fracture or dislocation of the cervical spine. 4. Stable left anterior frontal lobe encephalomalacia and empty sella  turcica. 5. Mild paranasal sinus mucosal thickening. 6. 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 Electronically  Signed   By: Kristine Garbe M.D.   On: 01/15/2018 19:08   Mr Brain Wo Contrast  Result Date: 01/15/2018 CLINICAL DATA:  Focal neuro deficit, > 6 hrs, stroke suspected. Word-finding difficulty and multiple falls EXAM: MRI HEAD WITHOUT CONTRAST TECHNIQUE: Multiplanar, multiecho pulse sequences of the brain and surrounding structures were obtained without intravenous contrast. COMPARISON:  Head CT 01/15/2018 FINDINGS: The examination had to be discontinued prior to completion due to patient inability to cooperate with the technologist's instructions. The patient was attempting to remove the head coil. There are 9 series provided. Axial T1-weighted imaging and coronal T2-weighted imaging was not obtained. BRAIN: There is no acute infarct, acute hemorrhage or mass effect. Partially empty sella. Left frontal encephalomalacia is unchanged. 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 White matter signal is otherwise normal. The CSF spaces are normal for age, with no hydrocephalus. Susceptibility-sensitive sequences show no chronic microhemorrhage or superficial siderosis. VASCULAR: Major intracranial arterial and venous sinus flow voids are preserved. SKULL AND UPPER CERVICAL SPINE: Remote bifrontal craniotomy. Large left parietal scalp hematoma. SINUSES/ORBITS: No fluid levels or advanced mucosal thickening. No mastoid or middle ear effusion. There are bilateral lens replacements. IMPRESSION: 1. Truncated examination.  No acute infarct. 2. Left frontal encephalomalacia and multiple punctate, old cerebellar infarcts. 3. Large left parietal scalp hematoma. Electronically Signed   By: Ulyses Jarred M.D.   On: 01/15/2018 22:51   Dg Hips Bilat W Or Wo Pelvis 3-4 Views  Result Date: 01/15/2018 CLINICAL DATA:  Fall with bilateral leg pain EXAM: DG HIP  (WITH OR WITHOUT PELVIS) 3-4V BILAT COMPARISON:  CT 08/20/2017 FINDINGS: SI joints are non widened. The pubic symphysis is intact. Possible acute minimally displaced fracture involving the right inferior pubic ramus. Both femoral heads project in joint. Vascular calcifications.  Stent in the right thigh. IMPRESSION: Possible acute minimally displaced fracture involving the right inferior pubic ramus. Otherwise no acute osseous abnormality is seen. Electronically Signed   By: Donavan Foil M.D.   On: 01/15/2018 18:38    I have reviewed the patient's current medications.  Assessment/Plan: 1 ESRD HD MWF 2 HTN better, lower meds 3 Falls primary issue 4 pelvic Fx 5 confusion ongoing but less aggitation.  Primary issue was meds 6 anemia stabel 7 Pelvic fx. Per Ortho P HD, mobilize,  Would not cont Coumadin.      LOS: 1 day   Jeneen Rinks Bellarose Burtt 01/17/2018,9:44 AM

## 2018-01-17 NOTE — Progress Notes (Signed)
ANTICOAGULATION CONSULT NOTE - Initial Consult  Pharmacy Consult for Coumadin and heparin Indication: h/o DVT  Allergies  Allergen Reactions  . Tuberculin Tests Hives    "blisters"  . Valacyclovir Other (See Comments)    Confusion and nervousness  . Codeine Nausea And Vomiting  . Penicillins Rash    No problems breathing. Has tolerated omnicef in past without issue Has patient had a PCN reaction causing immediate rash, facial/tongue/throat swelling, SOB or lightheadedness with hypotension: Yes Has patient had a PCN reaction causing severe rash involving mucus membranes or skin necrosis: No Has patient had a PCN reaction that required hospitalization No Has patient had a PCN reaction occurring within the last 10 years: No If all of the above answers are "NO", then may proceed with Cephalosporin  . Sulfamethoxazole Rash    Patient Measurements: Height: 5\' 1"  (154.9 cm) Weight: 147 lb 14.9 oz (67.1 kg) IBW/kg (Calculated) : 47.8  Vital Signs: Temp: 97.9 F (36.6 C) (08/18 0628) Temp Source: Oral (08/18 0628) BP: 146/49 (08/18 0628) Pulse Rate: 87 (08/18 0628)  Labs: Recent Labs    01/15/18 1708 01/16/18 0014 01/17/18 0308  HGB 11.0* 10.4* 10.9*  HCT 37.8 32.6* 35.3*  PLT 286 143* 146*  APTT 31  --   --   LABPROT 13.7 25.6* 21.7*  INR 1.06 2.36 1.91  CREATININE 1.34* 7.81* 4.47*    Estimated Creatinine Clearance: 9.4 mL/min (A) (by C-G formula based on SCr of 4.47 mg/dL (H)).   Medical History: Past Medical History:  Diagnosis Date  . Abnormality of gait 03/28/2015  . Adrenal mass ([place])   . 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 Arthritis   . Back pain   . CHF (congestive heart failure) ([place])   . Chronic kidney disease    [place] MWF  . Congestion of throat    Pt states she has a lot mucus in back throat.  . Constipation   . COPD (chronic obstructive pulmonary disease) ([place])   . Depression   . Dialysis patient [facility])     kidney  . Diverticulitis   . GERD (gastroesophageal reflux disease)   . H/O hiatal hernia   . High cholesterol   . Hoarseness of voice   . Hyperlipidemia   . Hypertension   . IBS (irritable bowel syndrome)   . Meralgia paresthetica of right side 12/26/2014  . Normal cardiac stress test 12/24/2009   lexiscan, imaging normal  . PAD (peripheral artery disease) ([place]) 12/21/2017  . Pneumonia    [id number]  . Renal disorder   . RLS (restless legs syndrome)   . Seizures ([place])    2004 past brain surgery  . Sinus complaint   . Stroke ([place])    TIAs per patient 2 or 3  . Thyroid disease   . TIA (transient ischemic attack)   . Tubular adenoma of colon 01/2008    Medications:  No current facility-administered medications on file prior to encounter.    Current Outpatient Medications on File Prior to Encounter  Medication Sig Dispense Refill  . albuterol (PROVENTIL HFA;VENTOLIN HFA) 108 (90 Base) MCG/ACT inhaler Inhale 2 puffs into the lungs every 4 (four) hours as needed for wheezing or shortness of breath. 1 Inhaler 1  . allopurinol (ZYLOPRIM) 100 MG tablet Take 100 mg by mouth once daily on non-dialysis days (Tues/Thurs/Sat/Sun) (Patient taking differently: Take 100 mg by mouth See admin instructions. Take one tablet (100 mg) by  mouth once daily on non-dialysis days (Tues/Thurs/Sat/Sun)) 30 tablet 0  . amLODipine (NORVASC) 10 MG tablet TAKE 1 TABLET BY MOUTH AT BEDTIME (Patient taking differently: Take 10 mg by mouth at bedtime. ) 90 tablet 1  . atorvastatin (LIPITOR) 40 MG tablet Take 1 tablet (40 mg total) by mouth daily. (Patient taking differently: Take 40 mg by mouth at bedtime. ) 90 tablet 3  . azelastine (ASTELIN) 0.1 % nasal spray Place 1 spray into both nostrils 2 (two) times daily. Use in each nostril as directed (Patient taking differently: Place 1 spray into both nostrils 2 (two) times daily as needed for rhinitis or allergies. Use in each nostril as directed) 30 mL 6  . B  Complex-C-Zn-Folic Acid (DIALYVITE/ZINC) TABS Take 1 tablet by mouth daily.  6  . cefUROXime (CEFTIN) 500 MG tablet Take 500 mg after HD days. (Patient taking differently: Take 500 mg by mouth See admin instructions. Start date 01/15/18: take one tablet (500 mg) by mouth after dialysis (Monday, Wednesday, Friday) for 4 doses) 4 tablet 0  . cinacalcet (SENSIPAR) 90 MG tablet Take 90 mg by mouth at bedtime.    . DULoxetine (CYMBALTA) 30 MG capsule Take 2 capsules (60 mg total) by mouth daily. (Patient taking differently: Take 60 mg by mouth at bedtime. ) 180 capsule 3  . fluticasone (FLONASE) 50 MCG/ACT nasal spray Place 2 sprays into both nostrils daily as needed for allergies or rhinitis. 16 g 3  . hydrOXYzine (ATARAX/VISTARIL) 25 MG tablet Take 1 tablet (25 mg total) by mouth every 8 (eight) hours as needed for itching. 30 tablet 1  . lidocaine-prilocaine (EMLA) cream Apply 1 application topically See admin instructions. Apply topically one hour before dialysis - Monday, Wednesday, Friday    . loratadine (CLARITIN) 10 MG tablet 10 mg every other day. (Patient taking differently: Take 10 mg by mouth daily as needed for allergies. 10 mg every other day.) 45 tablet 1  . losartan (COZAAR) 100 MG tablet Take 1 tablet (100 mg total) by mouth daily. 90 tablet 1  . metoprolol succinate (TOPROL-XL) 100 MG 24 hr tablet TAKE 1 TABLET (100 MG)  BY MOUTH TWICE DAILY (Patient taking differently: Take 100 mg by mouth 2 (two) times daily. ) 180 tablet 3  . multivitamin (RENA-VIT) TABS tablet Take 1 tablet by mouth at bedtime. (Patient taking differently: Take 1 tablet by mouth daily. ) 60 tablet 5  . oxyCODONE-acetaminophen (PERCOCET) 5-325 MG tablet Take 1 tablet by mouth every 8 (eight) hours as needed for severe pain. (Patient taking differently: Take 0.5-1 tablets by mouth every 8 (eight) hours as needed for severe pain. ) 45 tablet 0  . polyethylene glycol powder (MIRALAX) powder Take 17 g by mouth daily. 255 g 0   . predniSONE (DELTASONE) 20 MG tablet Take 2 tablets (40 mg total) by mouth daily with breakfast. 4 tablet 0  . pregabalin (LYRICA) 100 MG capsule Take 100 mg by mouth at bedtime.     . sevelamer carbonate (RENVELA) 800 MG tablet Take 2,400 mg by mouth See admin instructions. Take 3 tablets (2400 mg) by mouth with each meal or snack - once or twice daily    . warfarin (COUMADIN) 5 MG tablet Take 5 mg Monday Wednesday Friday and 2.5 mg all other days.check inr 7/29. (Patient taking differently: Take 2.5-5 mg by mouth See admin instructions. Take 1 tablet (5 mg) on Wednesday mornings,  take 1/2 tablet (2.5 mg) on all other mornings) 30 tablet 1  .  budesonide-formoterol (SYMBICORT) 160-4.5 MCG/ACT inhaler Inhale 2 puffs into the lungs 2 (two) times daily. 1 Inhaler 12  . glycopyrrolate (ROBINUL) 2 MG tablet Take 1 tablet (2 mg total) by mouth 2 (two) times daily. (Patient not taking: Reported on 01/15/2018) 60 tablet 11  . senna-docusate (SENOKOT-S) 8.6-50 MG tablet Take 1 tablet by mouth 2 (two) times daily.       Assessment: 77 y.o. female admitted 8/16 with pelvic fracture d/t fall. Patient recently diagnosed with a DVT on 12/23/17 and patient reports currently taking warfarin 2.5 mg daily except Wednesday when she takes 5 mg. INR on admission was 2.36 and has decreased to 1.91 today. Since patients INR is subtherapeutic and has recent hxo DVT we will bridge with heparin until INR > 2.   Goal of Therapy:  INR 2-3 Heparin level 0.3-0.7 units/ml Monitor platelets by anticoagulation protocol: Yes   Plan:  Start heparin 900 units/hr Check heparin level in 6 hours Give warfarin 2.5 mg x 1 tonight  Monitor INR, heparin level, and CBC daily Monitor for s/sx of bleeding  Britt Boozer, PharmD PGY1 Pharmacy Resident Phone: [phone] 01/17/2018,10:08 AM

## 2018-01-17 NOTE — Plan of Care (Signed)

## 2018-01-17 NOTE — NC FL2 (Addendum)
[place] LEVEL OF CARE SCREENING TOOL     IDENTIFICATION  Patient Name: [name] Birthdate: 1941/02/24 Sex: female Admission Date (Current Location): 01/15/2018  [place] and [place] Number:  Kathleen Argue [id number] [place] and Address:  The . [facility], [place] [address], [place],  [place] [address]      Provider Number: [id number]  Attending Physician Name and Address:  Elodia Florence., *  Relative Name and Phone Number:  Benjaman Pott - daughter, [phone]    Current Level of Care: Hospital Recommended Level of Care: [place] Prior Approval Number:    Date Approved/Denied:   PASRR Number: [id number]   expires 02/20/18  Discharge Plan: SNF    Current Diagnoses: Patient Active Problem List   Diagnosis Date Noted  . Fall 01/16/2018  . Fracture of multiple pubic rami ([place]) 01/15/2018  . Closed fracture of multiple pubic rami, right, initial encounter ([place]) 01/15/2018  . History of DVT of lower extremity 01/12/2018  . Prolonged QT interval 01/12/2018  . Long term (current) use of anticoagulants 01/04/2018  . Right leg pain 12/21/2017  . PAD (peripheral artery disease) ([place]) 12/21/2017  . Leukocytosis 12/21/2017  . Hyperlipidemia 12/01/2017  . Atherosclerosis of native arteries of extremity with rest pain ([place]) 12/01/2017  . LLQ abdominal pain 08/10/2017  . Left bundle branch block 07/24/2017  . Chest pain 07/24/2017  . Weakness 12/23/2016  . Generalized weakness 12/22/2016  . HCAP (healthcare-associated pneumonia) 12/06/2016  . Hypokalemia 12/06/2016  . Chronic systolic CHF (congestive heart failure) ([place]) 12/06/2016  . Osteopenia 09/08/2016  . Allergic rhinitis 08/07/2016  . BMI 32.0-32.9,adult 08/07/2016  . Onychomycosis 05/09/2016  . Nonspecific chest pain 04/19/2016  . Leg pain, bilateral 12/05/2015  . Trouble in sleeping 11/22/2015  . End stage renal disease ([place])   . Acute on chronic  respiratory failure with hypoxia ([place]) 11/12/2015  . ESRD on dialysis ([place])   . Chronic obstructive pulmonary disease ([place])   . Hypertensive urgency 09/20/2015  . Fecal incontinence   . Adverse effects of medication 06/21/2015  . Herpes 06/19/2015  . Skin lesion 06/16/2015  . Recurrent genital herpes 06/16/2015  . COPD exacerbation ([place]) 06/05/2015  . Onychocryptosis 04/08/2015  . Unstable gait 03/28/2015  . Renal mass   . Hypertension   . Inadequate pain control 12/30/2014  . CHF (congestive heart failure) ([place]) 12/30/2014  . Meralgia paresthetica of right side 12/26/2014  . Hereditary and idiopathic peripheral neuropathy 02/15/2013  . Dermatitis of face 08/19/2012  . HIP PAIN, BILATERAL 12/07/2008  . BLADDER PROLAPSE 11/17/2007  . OSTEOARTHRITIS, GENERALIZED, MULTIPLE JOINTS 08/18/2007  . Secondary renal hyperparathyroidism ([place]) 06/07/2007  . Anxiety and depression 04/23/2007  . Anemia of chronic disease 03/31/2007  . TOBACCO ABUSE 12/10/2006  . HIATAL HERNIA 12/10/2006  . Dysphagia 12/10/2006  . HYPERCHOLESTEROLEMIA 07/30/2006  . Gout, unspecified 07/30/2006  . Major depression in partial remission ([place]) 07/30/2006  . Hypertensive renal disease, malignant, with renal failure 07/30/2006  . GASTROESOPHAGEAL REFLUX, NO ESOPHAGITIS 07/30/2006    Orientation RESPIRATION BLADDER Height & Weight     Self, Time, Situation, Place  Normal Continent Weight: 147 lb 14.9 oz (67.1 kg) Height:  5\' 1"  (154.9 cm)  BEHAVIORAL SYMPTOMS/MOOD NEUROLOGICAL BOWEL NUTRITION STATUS      Continent Diet(clear liquid diet ; Fluid consistency: Thin; Fluid restriction: 1200 mL Fluid )  AMBULATORY STATUS COMMUNICATION OF NEEDS Skin   Extensive Assist Verbally Normal  Personal Care Assistance Level of Assistance  Bathing, Feeding, Dressing Bathing Assistance: Maximum assistance Feeding assistance: Independent Dressing Assistance: Maximum assistance     Functional  Limitations Info  Sight, Hearing, Speech Sight Info: Impaired Hearing Info: Adequate Speech Info: Adequate    SPECIAL CARE FACTORS FREQUENCY  PT (By licensed PT), OT (By licensed OT)     PT Frequency: 3x OT Frequency: 3x            Contractures Contractures Info: Not present    Additional Factors Info  Code Status, Allergies Code Status Info: Full Code Allergies Info: Tuberculin Tests, Valacyclovir, Codeine, Penicillins, Sulfamethoxazole           Current Medications (01/17/2018):  This is the current hospital active medication list Current Facility-Administered Medications  Medication Dose Route Frequency Provider Last Rate Last Dose  . 0.9 %  sodium chloride infusion  100 mL Intravenous PRN Valentina Gu, NP      . 0.9 %  sodium chloride infusion  100 mL Intravenous PRN Valentina Gu, NP      . acetaminophen (TYLENOL) tablet 1,000 mg  1,000 mg Oral Q8H Elodia Florence., MD   1,000 mg at 01/17/18 1201  . albuterol (PROVENTIL) (2.5 MG/3ML) 0.083% nebulizer solution 2.5 mg  2.5 mg Inhalation Q4H PRN Rise Patience, MD      . allopurinol (ZYLOPRIM) tablet 100 mg  100 mg Oral Q T,Th,S,Su Rise Patience, MD   100 mg at 01/17/18 1031  . amLODipine (NORVASC) tablet 5 mg  5 mg Oral QHS Deterding, Jeneen Rinks, MD      . atorvastatin (LIPITOR) tablet 40 mg  40 mg Oral QHS Rise Patience, MD   40 mg at 01/16/18 2112  . Chlorhexidine Gluconate Cloth 2 % PADS 6 each  6 each Topical [id number] Mauricia Area, MD   6 each at 01/17/18 1129  . cinacalcet (SENSIPAR) tablet 90 mg  90 mg Oral QHS Rise Patience, MD   90 mg at 01/16/18 2115  . diclofenac sodium (VOLTAREN) 1 % transdermal gel 2 g  2 g Topical QID Elodia Florence., MD      . doxercalciferol (HECTOROL) injection 1 mcg  1 mcg Intravenous Q M,W,F-HD Valentina Gu, NP      . DULoxetine (CYMBALTA) DR capsule 60 mg  60 mg Oral QHS Rise Patience, MD   60 mg at 01/16/18 2115  .  fluticasone (FLONASE) 50 MCG/ACT nasal spray 2 spray  2 spray Each Nare Daily PRN Rise Patience, MD      . heparin ADULT infusion 100 units/mL (25000 units/269mL sodium chloride 0.45%)  900 Units/hr Intravenous Continuous Britt Boozer, RPH 9 mL/hr at 01/17/18 1057 900 Units/hr at 01/17/18 1057  . heparin injection 1,000 Units  1,000 Units Dialysis PRN Valentina Gu, NP      . lidocaine (LIDODERM) 5 % 1 patch  1 patch Transdermal Q24H Elodia Florence., MD      . lidocaine (PF) (XYLOCAINE) 1 % injection 5 mL  5 mL Intradermal PRN Valentina Gu, NP      . lidocaine-prilocaine (EMLA) cream 1 application  1 application Topical PRN Valentina Gu, NP      . loratadine (CLARITIN) tablet 10 mg  10 mg Oral Daily PRN Rise Patience, MD      . losartan (COZAAR) tablet 100 mg  100 mg Oral Nile Riggs, MD      .  metoprolol succinate (TOPROL-XL) 24 hr tablet 100 mg  100 mg Oral QHS Deterding, Jeneen Rinks, MD      . mometasone-formoterol (DULERA) 200-5 MCG/ACT inhaler 2 puff  2 puff Inhalation BID Rise Patience, MD      . morphine 2 MG/ML injection 0.5 mg  0.5 mg Intravenous Q2H PRN Rise Patience, MD   0.5 mg at 01/17/18 1248  . multivitamin (RENA-VIT) tablet 1 tablet  1 tablet Oral QHS Rise Patience, MD   1 tablet at 01/16/18 2112  . oxyCODONE (Oxy IR/ROXICODONE) immediate release tablet 2.5 mg  2.5 mg Oral Q4H PRN Elodia Florence., MD      . pentafluoroprop-tetrafluoroeth [facility]) aerosol 1 application  1 application Topical PRN Valentina Gu, NP      . polyethylene glycol (MIRALAX / GLYCOLAX) packet 17 g  17 g Oral Daily Elodia Florence., MD   17 g at 01/17/18 1030  . pregabalin (LYRICA) capsule 100 mg  100 mg Oral QHS Rise Patience, MD   100 mg at 01/16/18 2112  . senna-docusate (Senokot-S) tablet 1 tablet  1 tablet Oral BID Rise Patience, MD   1 tablet at 01/17/18 1031  . sevelamer carbonate (RENVELA)  tablet 2,400 mg  2,400 mg Oral TID WC Rise Patience, MD   2,400 mg at 01/17/18 1129  . sevelamer carbonate (RENVELA) tablet 2,400 mg  2,400 mg Oral PRN Rise Patience, MD      . warfarin (COUMADIN) tablet 2.5 mg  2.5 mg Oral Once per day on Sun Mon Tue Thu Fri Sat Rise Patience, MD       And  . Derrill Memo ON 01/20/2018] warfarin (COUMADIN) tablet 5 mg  5 mg Oral Q Wed-1800 Rise Patience, MD      . Warfarin - Pharmacist Dosing Inpatient   Does not apply [id number] Rise Patience, MD         Discharge Medications: Please see discharge summary for a list of discharge medications.  Relevant Imaging Results:  Relevant Lab Results:   Additional Information [phone]. Dialysis patient MWF at [facility].  Caresse Sedivy A Cecilee Rosner, LCSW

## 2018-01-18 ENCOUNTER — Ambulatory Visit: Payer: Medicare Other

## 2018-01-18 DIAGNOSIS — Z515 Encounter for palliative care: Secondary | ICD-10-CM

## 2018-01-18 DIAGNOSIS — M545 Low back pain: Secondary | ICD-10-CM

## 2018-01-18 DIAGNOSIS — Z7189 Other specified counseling: Secondary | ICD-10-CM

## 2018-01-18 LAB — MAGNESIUM: MAGNESIUM: 2.3 mg/dL (ref 1.7–2.4)

## 2018-01-18 LAB — COMPREHENSIVE METABOLIC PANEL
ALT: 15 U/L (ref 0–44)
AST: 22 U/L (ref 15–41)
Albumin: 2.1 g/dL — ABNORMAL LOW (ref 3.5–5.0)
Alkaline Phosphatase: 84 U/L (ref 38–126)
Anion gap: 11 (ref 5–15)
BUN: 21 mg/dL (ref 8–23)
CO2: 29 mmol/L (ref 22–32)
Calcium: 7.7 mg/dL — ABNORMAL LOW (ref 8.9–10.3)
Chloride: 100 mmol/L (ref 98–111)
Creatinine, Ser: 7.11 mg/dL — ABNORMAL HIGH (ref 0.44–1.00)
GFR, EST AFRICAN AMERICAN: 6 mL/min — AB (ref >60)
GFR, EST NON AFRICAN AMERICAN: 5 mL/min — AB (ref >60)
Glucose, Bld: 90 mg/dL (ref 70–99)
POTASSIUM: 4.1 mmol/L (ref 3.5–5.1)
Sodium: 140 mmol/L (ref 135–145)
Total Bilirubin: 1 mg/dL (ref 0.3–1.2)
Total Protein: 6.3 g/dL — ABNORMAL LOW (ref 6.5–8.1)

## 2018-01-18 LAB — GLUCOSE, CAPILLARY: Glucose-Capillary: 39 mg/dL — CL (ref 70–99)

## 2018-01-18 LAB — CBC
HEMATOCRIT: 35.9 % — AB (ref 36.0–46.0)
Hemoglobin: 11.1 g/dL — ABNORMAL LOW (ref 12.0–15.0)
MCH: 27.4 pg (ref 26.0–34.0)
MCHC: 30.9 g/dL (ref 30.0–36.0)
MCV: 88.6 fL (ref 78.0–100.0)
PLATELETS: 192 10*3/uL (ref 150–400)
RBC: 4.05 MIL/uL (ref 3.87–5.11)
RDW: 17 % — AB (ref 11.5–15.5)
WBC: 11.5 10*3/uL — AB (ref 4.0–10.5)

## 2018-01-18 LAB — HEPARIN LEVEL (UNFRACTIONATED): Heparin Unfractionated: 0.43 IU/mL (ref 0.30–0.70)

## 2018-01-18 LAB — PROTIME-INR
INR: 1.54
Prothrombin Time: 18.4 seconds — ABNORMAL HIGH (ref 11.4–15.2)

## 2018-01-18 MED ORDER — TRAZODONE HCL 50 MG PO TABS
50.0000 mg | ORAL_TABLET | Freq: Every day | ORAL | Status: DC
  Administered 2018-01-19 – 2018-01-22 (×4): 50 mg via ORAL
  Filled 2018-01-18 (×5): qty 1

## 2018-01-18 MED ORDER — HEPARIN SODIUM (PORCINE) 5000 UNIT/ML IJ SOLN
5000.0000 [IU] | Freq: Three times a day (TID) | INTRAMUSCULAR | Status: DC
  Administered 2018-01-18 – 2018-01-22 (×9): 5000 [IU] via SUBCUTANEOUS
  Filled 2018-01-18 (×9): qty 1

## 2018-01-18 MED ORDER — WARFARIN SODIUM 5 MG PO TABS: 5.0000 mg | ORAL_TABLET | Freq: Once | ORAL | Status: DC

## 2018-01-18 MED ORDER — WHITE PETROLATUM EX OINT
TOPICAL_OINTMENT | CUTANEOUS | Status: AC
  Administered 2018-01-19: 01:00:00
  Filled 2018-01-18: qty 28.35

## 2018-01-18 MED ORDER — WARFARIN SODIUM 5 MG PO TABS
5.0000 mg | ORAL_TABLET | Freq: Once | ORAL | Status: DC
  Filled 2018-01-18: qty 1

## 2018-01-18 MED ORDER — DOXERCALCIFEROL 4 MCG/2ML IV SOLN
INTRAVENOUS | Status: AC
  Administered 2018-01-18: 1 ug
  Filled 2018-01-18: qty 2

## 2018-01-18 MED ORDER — PRO-STAT SUGAR FREE PO LIQD
30.0000 mL | Freq: Two times a day (BID) | ORAL | Status: DC
  Administered 2018-01-22: 30 mL via ORAL
  Filled 2018-01-18 (×6): qty 30

## 2018-01-18 MED ORDER — NEPRO/CARBSTEADY PO LIQD
237.0000 mL | Freq: Two times a day (BID) | ORAL | Status: DC
  Administered 2018-01-18: 237 mL via ORAL
  Filled 2018-01-18 (×6): qty 237

## 2018-01-18 MED ORDER — CLOPIDOGREL BISULFATE 75 MG PO TABS
75.0000 mg | ORAL_TABLET | Freq: Every day | ORAL | Status: DC
  Administered 2018-01-19 – 2018-01-22 (×4): 75 mg via ORAL
  Filled 2018-01-18 (×4): qty 1

## 2018-01-18 NOTE — Plan of Care (Signed)
  Problem: Pain Managment: Goal: General experience of comfort will improve Outcome: Progressing   Problem: Safety: Goal: Ability to remain free from injury will improve Outcome: Progressing   Problem: Skin Integrity: Goal: Risk for impaired skin integrity will decrease Outcome: Progressing   

## 2018-01-18 NOTE — Clinical Social Work Note (Signed)
Clinical Social Work Assessment  Patient Details  Name: [name] MRN: [id number] Date of Birth: 12-29-1940  Date of referral:  01/18/18               Reason for consult:  Facility Placement                Permission sought to share information with:  Family Supports Permission granted to share information::  Yes, Verbal Permission Granted  Name::     Dealer::  [place]  Relationship::  Daughter  Contact Information:  [phone]  Housing/Transportation Living arrangements for the past 2 months:  [place] of Information:  Patient Patient Interpreter Needed:  None Criminal Activity/Legal Involvement Pertinent to Current Situation/Hospitalization:  No - Comment as needed Significant Relationships:  Adult Children Lives with:  Self Do you feel safe going back to the place where you live?  No Need for family participation in patient care:  No (Coment)  Care giving concerns:  Pt is alert to self and time. Pt gave CSW verbal permission to follow up with her daughter [name].   Social Worker assessment / plan:  CSW spoke with pt at bedside. Pt is agreeable to SNF and states she wants to go to [facility]. Pt has moments of confusion. Pt gave CSW verbal permission to contact pt's daughter to confirm plan. CSW will follow up with daughter and send pt's referral to [facility].  Employment status:  Retired Forensic scientist:  Medicare PT Recommendations:  Parks / Referral to community resources:  [place]  Patient/Family's Response to care:  Pt verbalized understanding of CSW role and expressed appreciation for support. Pt denies any concern regarding pt care at this time.   Patient/Family's Understanding of and Emotional Response to Diagnosis, Current Treatment, and Prognosis:  Pt understanding and realistic regarding physical limitations. Pt understands the need for SNF placement at d/c. Pt agreeable to SNF  placement at d/c, at this time. Pt's responses emotionally appropriate during conversation with CSW. Pt denies any concern regarding treatment plan at this time. CSW will continue to provide support and facilitate d/c needs.   Emotional Assessment Appearance:  Appears stated age Attitude/Demeanor/Rapport:  (Patient was appropriate) Affect (typically observed):  Accepting, Calm, Appropriate Orientation:  Oriented to Self, Oriented to Place Alcohol / Substance use:  Not Applicable Psych involvement (Current and /or in the community):  No (Comment)  Discharge Needs  Concerns to be addressed:  Basic Needs, Care Coordination Readmission within the last 30 days:  Yes Current discharge risk:  Dependent with Mobility Barriers to Discharge:  Continued Medical Work up   [employer], LCSW 01/18/2018, 9:41 AM

## 2018-01-18 NOTE — Progress Notes (Signed)
ANTICOAGULATION CONSULT NOTE - Follow Up Consult  Pharmacy Consult for Heparin/Coumadin Indication: DVT  Allergies  Allergen Reactions  . Tuberculin Tests Hives    "blisters"  . Valacyclovir Other (See Comments)    Confusion and nervousness  . Codeine Nausea And Vomiting  . Penicillins Rash    No problems breathing. Has tolerated omnicef in past without issue Has patient had a PCN reaction causing immediate rash, facial/tongue/throat swelling, SOB or lightheadedness with hypotension: Yes Has patient had a PCN reaction causing severe rash involving mucus membranes or skin necrosis: No Has patient had a PCN reaction that required hospitalization No Has patient had a PCN reaction occurring within the last 10 years: No If all of the above answers are "NO", then may proceed with Cephalosporin  . Sulfamethoxazole Rash    Patient Measurements: Height: 5\' 1"  (154.9 cm) Weight: 147 lb 14.9 oz (67.1 kg) IBW/kg (Calculated) : 47.8 Heparin Dosing Weight:  62.4 kg  Vital Signs: Temp: 97.7 F (36.5 C) (08/19 0406) Temp Source: Oral (08/19 0406) BP: 150/71 (08/19 0406) Pulse Rate: 83 (08/19 0406)  Labs: Recent Labs    01/15/18 1708 01/16/18 0014 01/17/18 0308 01/17/18 1652 01/18/18 0503  HGB 11.0* 10.4* 10.9*  --  11.1*  HCT 37.8 32.6* 35.3*  --  35.9*  PLT 286 143* 146*  --  192  APTT 31  --   --   --   --   LABPROT 13.7 25.6* 21.7*  --  18.4*  INR 1.06 2.36 1.91  --  1.54  HEPARINUNFRC  --   --   --  <0.10* 0.43  CREATININE 1.34* 7.81* 4.47*  --  7.11*    Estimated Creatinine Clearance: 5.9 mL/min (A) (by C-G formula based on SCr of 7.11 mg/dL (H)).   Assessment:  Anticoag: h/o recent DVT. INR down to 1.54 (missed dose 8/17 AND 8/18?- SZP done). Bridging with heparin. HL 0.43 in goal. CBC stable. - PTA Warfarin 2.5 mg PO daily except 5 mg on Wednesday. Admit INR 2.36 - left frontal encephalomalacia/left large parietal scalp hematoma'  Goal of Therapy:  Heparin level  0.3-0.7 units/ml  INR 2-3 Monitor platelets by anticoagulation protocol: Yes   Plan:  Continue IV heparin at 900 units/hr Coumadin 5mg  po x 1 tonight. Daily HL, CBC, INR  Caydee Talkington S. Alford Highland, PharmD, [facility] Clinical Staff Pharmacist Pager [phone]  Eilene Ghazi Stillinger 01/18/2018,11:30 AM

## 2018-01-18 NOTE — Progress Notes (Signed)
  Speech Language Pathology Treatment: Dysphagia  Patient Details Name: [name] MRN: [id number] DOB: 22-Jun-1940 Today's Date: 01/18/2018 Time: 9024-0973 SLP Time Calculation (min) (ACUTE ONLY): 23 min  Assessment / Plan / Recommendation Clinical Impression  Patient's AMS has cleared. She was able to provide history of swallowing test (2 years ago). Patient reports difficulty swallowing solids unless she chews it very finely. She feels she has managed swallowing over the last 2 years by eating very slowly and chewing her food very well. However, she reports she has lost her desire to eat lately due to it taking so long to eat. Patient has dentures but they are not present at this time. Observed patient with solids and she had no difficulty with chewing or swallowing. Patient did take a long time to masticate the graham cracker. Due to need to chew food finely and the absence of her dentures, a Dys 2, thin liquid diet are recommended. Speech therapy to follow up for diet tolerance. Medication to be given crushed in puree. Communicated with RN.   HPI HPI: [name] is a 77 y.o. female with history of ESRD, carotid artery disease status post recent right lower extremity stent placement, history of DVT on Coumadin, CHF, chronic anemia who was recently admitted for pneumonia and was discharged home was brought to the ER after patient had 2 falls over the last 24 hours, admitted with hip fracture. MRI brain revealed no acute findings, old cerebellar infarcts; per chart review pt reporting difficulty swallowing. She had OP MBS 09/30/12 due to complaints of globus sensation, coughing/choking spells. MBS revealed normal oropharyngeal swallow function, no ST f/u recommended though w/u for possible LPR was suggested. Pt had EGD with dilation of entire esophagus 11/25/16 due to abnormal barium esophagram on 10/28/16, findings of presbyeophagus.       SLP Plan  Continue with current plan of care        Recommendations  Diet recommendations: Dysphagia 2 (fine chop);Thin liquid Liquids provided via: Cup;Straw Medication Administration: Crushed with puree Supervision: Patient able to self feed Compensations: Slow rate;Small sips/bites;Minimize environmental distractions Postural Changes and/or Swallow Maneuvers: Seated upright 90 degrees                Oral Care Recommendations: Oral care BID Plan: Continue with current plan of care       [place], MA, CCC-SLP 01/18/2018 11:26 AM

## 2018-01-18 NOTE — Progress Notes (Addendum)
PROGRESS NOTE    [name]  OHY:[id number] DOB: 06-30-1940 DOA: 01/15/2018 PCP: [place], Betty G, MD   Brief Narrative:  [name] is a 77 y.o. female with history of ESRD, carotid artery disease status post recent right lower extremity stent placement, history of DVT on Coumadin, CHF, chronic anemia who was recently admitted for pneumonia and was discharged home was brought to the ER after patient had 2 falls over the last 24 hours.  As per the patient's daughter first fall was when patient was with the health aide.  Did not lose consciousness.  Second fall was when patient was alone.  Patient's daughter heard a sound ventral check patient was on the floor.  By the time patient did not lose consciousness.  Did not have any chest pain or shortness of breath.  Had some slurred speech and difficulty speaking he was was called and was brought to the ER.  Assessment & Plan:   Principal Problem:   Fracture of multiple pubic rami (HCC) Active Problems:   Hypertension   ESRD on dialysis ([place])   PAD (peripheral artery disease) ([place])   Long term (current) use of anticoagulants   History of DVT of lower extremity   Closed fracture of multiple pubic rami, right, initial encounter ([place])   Fall   1. Right obturator externus and adductor brevis strain  1. Scheduled APAP, oxy 2.5 mg prn.  Also voltaren, lidocaine patch. 2. Follow up T/L spine plain films as pt c/o back pain (with degenerative changes) 3. Per ED discussion with ortho, WBAT, no surgical intervention 4. Will f/u MRI pelvis - with no acute osseous abnormality  2. Acute Encephalopathy  Difficulty speaking:  2/2 pain vs missed dialysis, though BUN not that high?  I think pain may be factor. This is improved today.  She's still slightly confused, but A&Ox3 today and able to give me updates about her study results from yesterday. 1. Discussed with neurology by EDP who recommended MRI (negative for acute stroke) 2. Pain control for  #1 3. No evidence of infection, CXR negative, follow UA 4. Delirium precautions 5. Speech for swallow -> currently dysphagia 2, thin liquid, meds crushed with puree (see note)    3. RLE weakness and pain:  I think this is most likely 2/2 pain, but pt unable to describe this well for me and also c/o difference in sensation b/n both legs (right more sensitive than L).  MRI of L spine with degenerative changes of lumbar spine (mild central spinal canal stenosis L3-4 and moderate L recess stenosis) as well as unchanged mild to moderate central spinal canal stenosis at L2-3 and bilateral neuroforaminal stenosis at L5-S1.   1. On review of previous records, the pain at least and the increased sensitivity on the R were described as recently as her most recent hospitalization.  The LE weakness was not described, but suspect they may be related.  Discussed lumbar MRI findings with NSGY who noted could f/u outpatient.  4. History of DVT on Coumadin  1. Coumadin per pharmacy 2. Heparin while INR subtherapeutic 3. Need to f/u with vascular as [place] from 7/30 and 7/23 read as negative, but Dr. Donnetta Hutching noted suspicion for DVT on 7/24.  Discussed this again with vascular today who reviewed [place] and noted no DVT present ok to d/c anticoagulation, recommended plavix for recent stent placement.  5. Peripheral arterial disease status post recent stent placement on coumadin.  stable  6. ESRD on hemodialysis on  Monday Wednesday and Friday 1. Renal following, appreciate assistance  7. Anemia likely from renal disease.  Stable, follow  8. Leukocytosis: mild, follow  9. Hypertension continue amlodipine, losartan, metoprolol  10. Gout: continue allopurinol  11. HLD: continue lipitor  12. Thrombocytopenia: mild, follow  13. Insomnia: describes having trouble sleeping and "talking to herself" all night.  Denies psychotic sx.  ? Dementia/delirium.  Will ctm.  Start trazodone.  14. Goals of care: apparently had  discussed stopping dialysis with renal.  Sounds like she's not as interested in this at this point, but will c/s palliative care per renal to assist with [place] conversations.  DVT prophylaxis: warfarin Code Status: full Family Communication: family at bedside Disposition Plan: pending further imaging and SNF placement   Consultants:   None at bedisde  Procedures:   none  Antimicrobials:  Anti-infectives (From admission, onward)   None     Subjective: C/o lower back pain. Doing ok. Feels better overall.  Objective: Vitals:   01/17/18 0628 01/17/18 1923 01/18/18 0406 01/18/18 0848  BP: (!) 146/49 (!) 141/47 (!) 150/71   Pulse: 87 (!) 103 83   Resp: (!) 21     Temp: 97.9 F (36.6 C) 98.2 F (36.8 C) 97.7 F (36.5 C)   TempSrc: Oral Oral Oral   SpO2: 100% 100% 100% 97%  Weight:      Height:        Intake/Output Summary (Last 24 hours) at 01/18/2018 1342 Last data filed at 01/18/2018 1100 Gross per 24 hour  Intake 233.18 ml  Output -  Net 233.18 ml   Filed Weights   01/16/18 0900 01/16/18 1405 01/16/18 1845  Weight: 69.9 kg 69.8 kg 67.1 kg    Examination:  General: No acute distress. Cardiovascular: Heart sounds show a regular rate, and rhythm. No gallops or rubs. No murmurs. No JVD. Lungs: Clear to auscultation bilaterally with good air movement. No rales, rhonchi or wheezes. Abdomen: Soft, nontender, nondistended Neurological: Alert and oriented 3. RLE weakness. Cranial nerves II through XII grossly intact. Skin: Warm and dry. No rashes or lesions. Extremities: TTP to RLE Psychiatric: Mood and affect are normal. Insight and judgment are appropriate.   Data Reviewed: I have personally reviewed following labs and imaging studies  CBC: Recent Labs  Lab 01/13/18 0824 01/15/18 1708 01/16/18 0014 01/17/18 0308 01/18/18 0503  WBC 6.8 8.5 7.8 12.8* 11.5*  NEUTROABS  --  4.5  --   --   --   HGB 10.1* 11.0* 10.4* 10.9* 11.1*  HCT 32.9* 37.8 32.6* 35.3*  35.9*  MCV 89.9 90.6 88.6 89.1 88.6  PLT 189 286 143* 146* 010   Basic Metabolic Panel: Recent Labs  Lab 01/12/18 2222 01/13/18 0824 01/15/18 1708 01/16/18 0014 01/17/18 0308 01/18/18 0503  NA 139 139 137 139 139 140  K 3.6 4.1 4.5 3.8 4.4 4.1  CL 97* 101 101 97* 99 100  CO2 30 28 28 26 31 29   GLUCOSE 206* 126* 120* 77 71 90  BUN 15 19 22  24* 10 21  CREATININE 6.17* 6.51* 1.34* 7.81* 4.47* 7.11*  CALCIUM 8.1* 8.3* 9.5 8.2* 8.0* 7.7*  MG  --   --   --   --  2.1 2.3  PHOS 2.5 3.2  --   --   --   --    GFR: Estimated Creatinine Clearance: 5.9 mL/min (A) (by C-G formula based on SCr of 7.11 mg/dL (H)). Liver Function Tests: Recent Labs  Lab 01/13/18 2725 01/15/18  1708 01/16/18 0014 01/17/18 0308 01/18/18 0503  AST  --  21 32 28 22  ALT  --  15 15 17 15   ALKPHOS  --  105 74 82 84  BILITOT  --  0.4 1.4* 1.0 1.0  PROT  --  7.8 6.1* 5.8* 6.3*  ALBUMIN 1.9* 3.5 2.1* 2.0* 2.1*   No results for input(s): LIPASE, AMYLASE in the last 168 hours. No results for input(s): AMMONIA in the last 168 hours. Coagulation Profile: Recent Labs  Lab 01/13/18 0532 01/15/18 1708 01/16/18 0014 01/17/18 0308 01/18/18 0503  INR 2.13 1.06 2.36 1.91 1.54   Cardiac Enzymes: No results for input(s): CKTOTAL, CKMB, CKMBINDEX, TROPONINI in the last 168 hours. BNP (last 3 results) No results for input(s): PROBNP in the last 8760 hours. HbA1C: No results for input(s): HGBA1C in the last 72 hours. CBG: Recent Labs  Lab 01/16/18 1056 01/16/18 1058 01/16/18 1114 01/16/18 1127  GLUCAP 39* 49* 75 130*   Lipid Profile: No results for input(s): CHOL, HDL, LDLCALC, TRIG, CHOLHDL, LDLDIRECT in the last 72 hours. Thyroid Function Tests: No results for input(s): TSH, T4TOTAL, FREET4, T3FREE, THYROIDAB in the last 72 hours. Anemia Panel: No results for input(s): VITAMINB12, FOLATE, FERRITIN, TIBC, IRON, RETICCTPCT in the last 72 hours. Sepsis Labs: No results for input(s): PROCALCITON,  LATICACIDVEN in the last 168 hours.  Recent Results (from the past 240 hour(s))  Culture, sputum-assessment     Status: None   Collection Time: 01/12/18  9:40 PM  Result Value Ref Range Status   Specimen Description EXPECTORATED SPUTUM  Final   Special Requests NONE  Final   Sputum evaluation   Final    THIS SPECIMEN IS ACCEPTABLE FOR SPUTUM CULTURE Performed at [facility], [address], [place], [place] [address]    Report Status 01/13/2018 FINAL  Final  Culture, respiratory     Status: None   Collection Time: 01/12/18  9:40 PM  Result Value Ref Range Status   Specimen Description EXPECTORATED SPUTUM  Final   Special Requests NONE Reflexed from [id number]  Final   Gram Stain   Final    MODERATE WBC PRESENT,BOTH PMN AND MONONUCLEAR NO ORGANISMS SEEN Performed at [facility], [place] [address], [place], [place] [address]    Culture RARE STAPHYLOCOCCUS AUREUS  Final   Report Status 01/16/2018 FINAL  Final   Organism ID, Bacteria STAPHYLOCOCCUS AUREUS  Final      Susceptibility   Staphylococcus aureus - MIC*    CIPROFLOXACIN <=0.5 SENSITIVE Sensitive     ERYTHROMYCIN >=8 RESISTANT Resistant     GENTAMICIN <=0.5 SENSITIVE Sensitive     OXACILLIN <=0.25 SENSITIVE Sensitive     TETRACYCLINE <=1 SENSITIVE Sensitive     VANCOMYCIN <=0.5 SENSITIVE Sensitive     TRIMETH/SULFA <=10 SENSITIVE Sensitive     CLINDAMYCIN RESISTANT Resistant     RIFAMPIN <=0.5 SENSITIVE Sensitive     Inducible Clindamycin POSITIVE Resistant     * RARE STAPHYLOCOCCUS AUREUS         Radiology Studies: Mr Lumbar Spine Wo Contrast  Result Date: 01/17/2018 CLINICAL DATA:  Right lower extremity weakness. Multiple falls recently. EXAM: MRI LUMBAR SPINE WITHOUT CONTRAST TECHNIQUE: Multiplanar, multisequence MR imaging of the lumbar spine was performed. No intravenous contrast was administered. COMPARISON:  MRI lumbar spine dated January 17, 2017. FINDINGS: Segmentation:  Standard. Alignment:   Unchanged trace anterolisthesis at L4-L5. Vertebrae: Unchanged heterogeneous marrow signal without focal lesion. No fracture or evidence of  discitis. Scattered chronic degenerative endplate changes. Conus medullaris and cauda equina: Conus extends to the L2 level. Conus and cauda equina appear normal. No epidural hematoma. Paraspinal and other soft tissues: Unchanged bilateral renal cortical atrophy and multiple simple cysts. Unchanged T1 hyperintense, T2 iso- to hypointense lesion in the left kidney, likely a hemorrhagic or proteinaceous cyst. Colonic diverticulosis. Disc levels: T9-T10: Mild disc degeneration and facet arthropathy with mild right neuroforaminal stenosis. T10-T11: Mild disc degeneration and facet arthropathy with mild right neuroforaminal stenosis. T11-T12: Mild disc degeneration and facet arthropathy with mild right neuroforaminal stenosis. T12-L1:  Negative. L1-L2:  Negative. L2-L3: Diffuse disc bulge, eccentric to the right. Mild bilateral facet arthropathy. Mild-to-moderate central spinal canal stenosis. No neuroforaminal stenosis. Findings are similar to prior study. L3-L4: Small disc bulge and moderate bilateral facet arthropathy with ligamentum flavum hypertrophy mild central spinal canal stenosis. Moderate left lateral recess stenosis. No neuroforaminal stenosis. Findings have progressed when compared to prior study. L4-L5: Diffuse disc bulge and moderate bilateral facet arthropathy with small facet joint effusions. Ligamentum flavum hypertrophy. Mild central spinal canal and bilateral recess stenosis. No neuroforaminal stenosis. Findings are similar to prior study. L5-S1: Normal disc. Severe bilateral facet arthropathy. Mild bilateral neuroforaminal stenosis. No spinal canal stenosis. Findings are similar to prior study. IMPRESSION: 1.  No acute abnormality. 2. Multilevel degenerative changes of the lumbar spine as described above, slightly progressed at L3-L4 where there is new mild  central spinal canal stenosis and moderate left lateral recess stenosis. 3. Unchanged mild to moderate central spinal canal stenosis at L2-L3. Unchanged mild bilateral neuroforaminal stenosis at L5-S1. 4. Unchanged moderate to severe lumbar facet arthropathy. Electronically Signed   By: Titus Dubin M.D.   On: 01/17/2018 14:37   Mr Pelvis Wo Contrast  Result Date: 01/17/2018 CLINICAL DATA:  Two falls over the past few days. Possible inferior pubic ramus fracture on x-ray. EXAM: MRI PELVIS WITHOUT CONTRAST TECHNIQUE: Multiplanar multisequence MR imaging of the pelvis was performed. No intravenous contrast was administered. COMPARISON:  Bilateral hip x-rays dated January 15, 2018. CT abdomen pelvis dated August 20, 2017. FINDINGS: Bones: There is no evidence of acute fracture, dislocation or avascular necrosis. Heterogeneous marrow signal without focal lesion. Small bilateral sacroiliac joint effusions, likely degenerative. Mild degenerative changes of the pubic symphysis. Articular cartilage and labrum Articular cartilage: No focal chondral defect or subchondral signal abnormality identified. Labrum: There is no gross labral tear or paralabral abnormality. Joint or bursal effusion Joint effusion: No significant hip joint effusion. Bursae: No focal periarticular fluid collection. Muscles and tendons Muscles and tendons: The visualized sartorius, rectus femoris, gluteus, hamstring and iliopsoas tendons appear normal. Mild edema within the proximal right obturator externus and adductor brevis muscles adjacent to the pubic symphysis. Faint edema within the bilateral piriformis muscles. Mild bilateral gluteus muscle atrophy. Other findings Miscellaneous: Prior hysterectomy. Trace presacral fluid. Sigmoid diverticulosis. IMPRESSION: 1.  No acute osseous abnormality. 2. Mild edema within the proximal right obturator externus and adductor brevis muscles could reflect strain. These results were called by telephone at the  time of interpretation on 01/17/2018 at 5:00 pm to Dr. Verlon Au , who verbally acknowledged these results. Electronically Signed   By: Titus Dubin M.D.   On: 01/17/2018 17:14        Scheduled Meds: . acetaminophen  1,000 mg Oral Q8H  . allopurinol  100 mg Oral Q T,Th,S,Su  . amLODipine  5 mg Oral QHS  . atorvastatin  40 mg Oral QHS  . Chlorhexidine  Gluconate Cloth  6 each Topical [id number]  . cinacalcet  90 mg Oral QHS  . diclofenac sodium  2 g Topical QID  . doxercalciferol  1 mcg Intravenous Q M,W,F-HD  . DULoxetine  60 mg Oral QHS  . feeding supplement (NEPRO CARB STEADY)  237 mL Oral BID BM  . feeding supplement (PRO-STAT SUGAR FREE 64)  30 mL Oral BID  . lidocaine  1 patch Transdermal Q24H  . losartan  100 mg Oral QHS  . metoprolol succinate  100 mg Oral QHS  . mometasone-formoterol  2 puff Inhalation BID  . multivitamin  1 tablet Oral QHS  . polyethylene glycol  17 g Oral Daily  . pregabalin  100 mg Oral QHS  . senna-docusate  1 tablet Oral BID  . sevelamer carbonate  2,400 mg Oral TID WC  . warfarin  5 mg Oral ONCE-1800  . Warfarin - Pharmacist Dosing Inpatient   Does not apply q1800   Continuous Infusions: . sodium chloride    . sodium chloride    . heparin 900 Units/hr (01/17/18 1800)     LOS: 2 days    Time spent: over 30 min MDM moderate with multiple medical issues    Fayrene Helper, MD [facility] Pager [phone]  If 7PM-7AM, please contact night-coverage [email or web address] Password [email or web address] 01/18/2018, 1:42 PM

## 2018-01-18 NOTE — Progress Notes (Addendum)
[facility] Progress Note   Dialysis Orders: [place] MWF 3 hrs 45 min 180 NRe 400/Autoflow 1.5 69.5 kg 2.0K/2.25 Ca  UFP 2 LUA AVG  -No Heparin -Mircera 50 mcg IV q 2 weeks (last dose 01/06/18 Last HGB 11.4 01/06/18) -Hectorol 1 mcg IV TIW    Assessment/Plan: 1. Pelvic fx - fall at home. - per primary - needs to be OOB and see if she can tolerate HD in a chair before discharge. WBAT per ortho 2. ESRD -MWF - HD today- need to do in a bed until PT has cleared to be OOB with transfers K 4.1 - use 3 K bath today 3. Anemia - hgb 11.1 - no ESA yet - last mircera 50 - due for dose 8/21  4. Secondary hyperparathyroidism - on sensipar hectorol/renvela 5. HTN/volume - current meds - prone to high BP in outpatient setting 6. Nutrition - alb 2.1 - on CL -looks like she needs ST clearance before diet can be advanced - add prostat 7. Deconditioning - SNF placement for rehab- needs to be able to do chair dialysis prior to d/c 8. Anxiety/depression -current meds/reassurance 9. Hx DVT -  12/23/17 - on heparin /coumadin per pharm - given fall history, risks may exceed benefits for treatment 10. Ecephalopathy/falls - MRI neg for acute infarct , left frontal encephalomalacia/left large parietal scalp hematoma 11. Goals of Care - Dr. Marval Regal was to have had a patient care meeting with her at her HD unit today.  She told him she was about 17% certain she was ready to stop dialysis.  Now, that "all this has happened to her" she feels like her family has rallied and she doesn't think she wants to stop dialysis even if it means going to a rehab facility.  She is willing to have a palliative care consult to discuss goals of care to include family members.  Myriam Jacobson, PA-C [place] 01/18/2018,9:17 AM  LOS: 2 days   Pt seen, examined and agree w A/P as above.  Kelly Splinter MD [employer] pager [phone]   01/18/2018, 3:49  PM    Subjective:   Many questions.  Doesn't fee like anyone is doing anything. Doesn't recall PT evaluation 8/17. Hasn't been OOB by her report since admission. Worried about lump on the back of her head, talking to herself. Hurts to move it bed.  Objective Vitals:   01/17/18 0628 01/17/18 1923 01/18/18 0406 01/18/18 0848  BP: (!) 146/49 (!) 141/47 (!) 150/71   Pulse: 87 (!) 103 83   Resp: (!) 21     Temp: 97.9 F (36.6 C) 98.2 F (36.8 C) 97.7 F (36.5 C)   TempSrc: Oral Oral Oral   SpO2: 100% 100% 100% 97%  Weight:      Height:       Physical Exam General: ill appearing NAD, post scalp hematoma Heart: RRR Lungs poor effort dim BS: Abdomen: soft NT Extremities: no LE edema Dialysis Access: left HERO graft + bruit   Additional Objective Labs: Basic Metabolic Panel: Recent Labs  Lab 01/12/18 2222 01/13/18 0824  01/16/18 0014 01/17/18 0308 01/18/18 0503  NA 139 139   < > 139 139 140  K 3.6 4.1   < > 3.8 4.4 4.1  CL 97* 101   < > 97* 99 100  CO2 30 28   < > 26 31 29   GLUCOSE 206* 126*   < > 77 71 90  BUN 15  19   < > 24* 10 21  CREATININE 6.17* 6.51*   < > 7.81* 4.47* 7.11*  CALCIUM 8.1* 8.3*   < > 8.2* 8.0* 7.7*  PHOS 2.5 3.2  --   --   --   --    < > = values in this interval not displayed.   Liver Function Tests: Recent Labs  Lab 01/16/18 0014 01/17/18 0308 01/18/18 0503  AST 32 28 22  ALT 15 17 15   ALKPHOS 74 82 84  BILITOT 1.4* 1.0 1.0  PROT 6.1* 5.8* 6.3*  ALBUMIN 2.1* 2.0* 2.1*   No results for input(s): LIPASE, AMYLASE in the last 168 hours. CBC: Recent Labs  Lab 01/13/18 0824 01/15/18 1708 01/16/18 0014 01/17/18 0308 01/18/18 0503  WBC 6.8 8.5 7.8 12.8* 11.5*  NEUTROABS  --  4.5  --   --   --   HGB 10.1* 11.0* 10.4* 10.9* 11.1*  HCT 32.9* 37.8 32.6* 35.3* 35.9*  MCV 89.9 90.6 88.6 89.1 88.6  PLT 189 286 143* 146* 192   Blood Culture    Component Value Date/Time   SDES EXPECTORATED SPUTUM 01/12/2018 2140   SDES EXPECTORATED  SPUTUM 01/12/2018 2140   SPECREQUEST NONE 01/12/2018 2140   SPECREQUEST NONE Reflexed from [id number] 01/12/2018 2140   CULT RARE STAPHYLOCOCCUS AUREUS 01/12/2018 2140   REPTSTATUS 01/13/2018 FINAL 01/12/2018 2140   REPTSTATUS 01/16/2018 FINAL 01/12/2018 2140    Cardiac Enzymes: No results for input(s): CKTOTAL, CKMB, CKMBINDEX, TROPONINI in the last 168 hours. CBG: Recent Labs  Lab 01/16/18 1056 01/16/18 1058 01/16/18 1114 01/16/18 1127  GLUCAP 39* 49* 75 130*   Iron Studies: No results for input(s): IRON, TIBC, TRANSFERRIN, FERRITIN in the last 72 hours. Lab Results  Component Value Date   INR 1.54 01/18/2018   INR 1.91 01/17/2018   INR 2.36 01/16/2018   Studies/Results: Dg Thoracic Spine 2 View  Result Date: 01/16/2018 CLINICAL DATA:  Mid back pain. EXAM: THORACIC SPINE 2 VIEWS COMPARISON:  01/15/2018 and prior chest radiographs FINDINGS: No acute fracture or subluxation. Mild multilevel degenerative disc disease in the LOWER thoracic spine again noted. No focal bony lesions are present. A LEFT IJ central venous catheter is again noted. IMPRESSION: 1. No acute abnormality 2. Mild multilevel degenerative changes in the LOWER thoracic spine. Electronically Signed   By: Margarette Canada M.D.   On: 01/16/2018 13:52   Dg Lumbar Spine 2-3 Views  Result Date: 01/16/2018 CLINICAL DATA:  Chronic low back pain. EXAM: LUMBAR SPINE - 2-3 VIEW COMPARISON:  08/20/2017 CT and prior studies FINDINGS: No acute fracture or subluxation noted. Mild to moderate multilevel degenerative disc disease, spondylosis and facet arthropathy noted. No focal bony lesions identified. Aortic atherosclerotic calcifications again noted. IMPRESSION: 1. No acute abnormality 2. Mild to moderate multilevel degenerative changes. 3.  Aortic Atherosclerosis (ICD10-I70.0). Electronically Signed   By: Margarette Canada M.D.   On: 01/16/2018 13:51   Mr Lumbar Spine Wo Contrast  Result Date: 01/17/2018 CLINICAL DATA:  Right lower  extremity weakness. Multiple falls recently. EXAM: MRI LUMBAR SPINE WITHOUT CONTRAST TECHNIQUE: Multiplanar, multisequence MR imaging of the lumbar spine was performed. No intravenous contrast was administered. COMPARISON:  MRI lumbar spine dated January 17, 2017. FINDINGS: Segmentation:  Standard. Alignment:  Unchanged trace anterolisthesis at L4-L5. Vertebrae: Unchanged heterogeneous marrow signal without focal lesion. No fracture or evidence of discitis. Scattered chronic degenerative endplate changes. Conus medullaris and cauda equina: Conus extends to the L2 level. Conus and cauda equina appear normal.  No epidural hematoma. Paraspinal and other soft tissues: Unchanged bilateral renal cortical atrophy and multiple simple cysts. Unchanged T1 hyperintense, T2 iso- to hypointense lesion in the left kidney, likely a hemorrhagic or proteinaceous cyst. Colonic diverticulosis. Disc levels: T9-T10: Mild disc degeneration and facet arthropathy with mild right neuroforaminal stenosis. T10-T11: Mild disc degeneration and facet arthropathy with mild right neuroforaminal stenosis. T11-T12: Mild disc degeneration and facet arthropathy with mild right neuroforaminal stenosis. T12-L1:  Negative. L1-L2:  Negative. L2-L3: Diffuse disc bulge, eccentric to the right. Mild bilateral facet arthropathy. Mild-to-moderate central spinal canal stenosis. No neuroforaminal stenosis. Findings are similar to prior study. L3-L4: Small disc bulge and moderate bilateral facet arthropathy with ligamentum flavum hypertrophy mild central spinal canal stenosis. Moderate left lateral recess stenosis. No neuroforaminal stenosis. Findings have progressed when compared to prior study. L4-L5: Diffuse disc bulge and moderate bilateral facet arthropathy with small facet joint effusions. Ligamentum flavum hypertrophy. Mild central spinal canal and bilateral recess stenosis. No neuroforaminal stenosis. Findings are similar to prior study. L5-S1: Normal disc.  Severe bilateral facet arthropathy. Mild bilateral neuroforaminal stenosis. No spinal canal stenosis. Findings are similar to prior study. IMPRESSION: 1.  No acute abnormality. 2. Multilevel degenerative changes of the lumbar spine as described above, slightly progressed at L3-L4 where there is new mild central spinal canal stenosis and moderate left lateral recess stenosis. 3. Unchanged mild to moderate central spinal canal stenosis at L2-L3. Unchanged mild bilateral neuroforaminal stenosis at L5-S1. 4. Unchanged moderate to severe lumbar facet arthropathy. Electronically Signed   By: Titus Dubin M.D.   On: 01/17/2018 14:37   Mr Pelvis Wo Contrast  Result Date: 01/17/2018 CLINICAL DATA:  Two falls over the past few days. Possible inferior pubic ramus fracture on x-ray. EXAM: MRI PELVIS WITHOUT CONTRAST TECHNIQUE: Multiplanar multisequence MR imaging of the pelvis was performed. No intravenous contrast was administered. COMPARISON:  Bilateral hip x-rays dated January 15, 2018. CT abdomen pelvis dated August 20, 2017. FINDINGS: Bones: There is no evidence of acute fracture, dislocation or avascular necrosis. Heterogeneous marrow signal without focal lesion. Small bilateral sacroiliac joint effusions, likely degenerative. Mild degenerative changes of the pubic symphysis. Articular cartilage and labrum Articular cartilage: No focal chondral defect or subchondral signal abnormality identified. Labrum: There is no gross labral tear or paralabral abnormality. Joint or bursal effusion Joint effusion: No significant hip joint effusion. Bursae: No focal periarticular fluid collection. Muscles and tendons Muscles and tendons: The visualized sartorius, rectus femoris, gluteus, hamstring and iliopsoas tendons appear normal. Mild edema within the proximal right obturator externus and adductor brevis muscles adjacent to the pubic symphysis. Faint edema within the bilateral piriformis muscles. Mild bilateral gluteus muscle  atrophy. Other findings Miscellaneous: Prior hysterectomy. Trace presacral fluid. Sigmoid diverticulosis. IMPRESSION: 1.  No acute osseous abnormality. 2. Mild edema within the proximal right obturator externus and adductor brevis muscles could reflect strain. These results were called by telephone at the time of interpretation on 01/17/2018 at 5:00 pm to Dr. Verlon Au , who verbally acknowledged these results. Electronically Signed   By: Titus Dubin M.D.   On: 01/17/2018 17:14   Medications: . sodium chloride    . sodium chloride    . heparin 900 Units/hr (01/17/18 1800)   . acetaminophen  1,000 mg Oral Q8H  . allopurinol  100 mg Oral Q T,Th,S,Su  . amLODipine  5 mg Oral QHS  . atorvastatin  40 mg Oral QHS  . Chlorhexidine Gluconate Cloth  6 each Topical Q0600  .  cinacalcet  90 mg Oral QHS  . diclofenac sodium  2 g Topical QID  . doxercalciferol  1 mcg Intravenous Q M,W,F-HD  . DULoxetine  60 mg Oral QHS  . lidocaine  1 patch Transdermal Q24H  . losartan  100 mg Oral QHS  . metoprolol succinate  100 mg Oral QHS  . mometasone-formoterol  2 puff Inhalation BID  . multivitamin  1 tablet Oral QHS  . polyethylene glycol  17 g Oral Daily  . pregabalin  100 mg Oral QHS  . senna-docusate  1 tablet Oral BID  . sevelamer carbonate  2,400 mg Oral TID WC  . warfarin  2.5 mg Oral Once per day on Sun Mon Tue Thu Fri Sat   And  . [START ON 01/20/2018] warfarin  5 mg Oral Q Wed-1800  . Warfarin - Pharmacist Dosing Inpatient   Does not apply [phone]

## 2018-01-18 NOTE — Consult Note (Addendum)
Consultation Note Date: 01/18/2018   Patient Name: [name]  DOB: Feb 24, 1941  MRN: [id number]  Age / Sex: 77 y.o., female  PCP: Martinique, Betty G, MD Referring Physician: Elodia Florence., *  Reason for Consultation: Establishing goals of care  HPI/Patient Profile: [name] is a 77 y.o. female with history of ESRD, carotid artery disease status post recent right lower extremity stent placement, history of DVT on Coumadin, CHF, chronic anemia who was recently admitted for pneumonia and was discharged home was brought to the ER after patient had 2 falls over the last 24 hours.  Clinical Assessment and Goals of Care: Met with patient. She is able to state her name, location, month and year, reason for admission, and the president. She has 3 children, and a husband with whom she is separated but not divorced. Two of her children live with her as well as a niece with special needs. [name] is her legal guardian.   She has been going to dialysis for around 6 years. She states she has hated it from the start, but has continued it for several reasons. One reason she names is that her children influence her. Her children are not willing to accept the idea of stopping dialysis, as this will mean death. She states they feel making that choice is atrocious.   Her second reason is that she is the person that has always held her family together and taken care of others.   Her third reason is that she is the legal guardian of [name].   [name] states she is at a place of wanting to do what she wants to do instead of what everyone else wants her to do. She states there is suffering that others can see, and suffering that others cannot. She considers carrying out dialysis when she does not want to a form of personal suffering, but her family does not understand this. She states that everyone has a time to die, and her  family needs to accept that.   Upon discussion of a family meeting to discuss these wishes, Desarai makes it clear she does not want to have a family meeting, or for this to be discussed with them. She states she was supposed to go to court today to renew guardianship for [name], but was going to transfer guardianship to the [name]'s brother. She states this needs to be taken care of as well as a few other financial issues before she is ready to stop dialysis and proceed with end of life care. She states discussing this with her family at this juncture would make things difficult and awkward for her.   She would like POA papers to appoint decision makers besides her estranged husband to be created at this time. Chaplain called to assist with this. She would like palliative to follow outpatient with family conversation and shift to comfort care once she has her affairs in order.    MOST form completed and signed for DNR, limited additional interventions, abx okay,  IV fluids okay, no feeding tube.   She would like to treat the treatable. She does not want to be placed on a ventilator for respiratory distress, or have chest compressions, shocks, or a breathing tube for cardio/pulmonary arrest.    SUMMARY OF RECOMMENDATIONS    Continue dialysis. Patient does not want providers/staff to discuss continuing/stopping dialysis or GOC with anyone, or in the presence of anyone other than the patient unless she lacks capacity.  Chaplain currently working on [employer] as she is separated but not divorced, making her husband her next of kin.   DNR/DNI.  Treat the treatable.   Code Status/Advance Care Planning:  DNR    Symptom Management:   Per primary team.   Palliative Prophylaxis:   Eye Care and Oral Care   Psycho-social/Spiritual:   Desire for further Chaplaincy support:Yes. Consulted.  Prognosis:   Unable to determine  Discharge Planning: To Be Determined      Primary  Diagnoses: Present on Admission: . Fracture of multiple pubic rami ([place]) . PAD (peripheral artery disease) ([place]) . Hypertension . Closed fracture of multiple pubic rami, right, initial encounter ([place]) . Fall   I have reviewed the medical record, interviewed the patient and family, and examined the patient. The following aspects are pertinent.  Past Medical History:  Diagnosis Date  . Abnormality of gait 03/28/2015  . Adrenal mass ([place])   . 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 Arthritis   . Back pain   . CHF (congestive heart failure) ([name])   . Chronic kidney disease    Hemo MWF  . Congestion of throat    Pt states she has a lot mucus in back throat.  . Constipation   . COPD (chronic obstructive pulmonary disease) ([place])   . Depression   . Dialysis patient [facility])    kidney  . Diverticulitis   . GERD (gastroesophageal reflux disease)   . H/O hiatal hernia   . High cholesterol   . Hoarseness of voice   . Hyperlipidemia   . Hypertension   . IBS (irritable bowel syndrome)   . Meralgia paresthetica of right side 12/26/2014  . Normal cardiac stress test 12/24/2009   lexiscan, imaging normal  . PAD (peripheral artery disease) ([name]) 12/21/2017  . Pneumonia    [id number]  . Renal disorder   . RLS (restless legs syndrome)   . Seizures ([name])    2004 past brain surgery  . Sinus complaint   . Stroke ([name])    TIAs per patient 2 or 3  . Thyroid disease   . TIA (transient ischemic attack)   . Tubular adenoma of colon 01/2008   Social History   Socioeconomic History  . Marital status: Legally Separated    Spouse name: Not on file  . Number of children: 3  . Years of education: 27  . Highest education level: Not on file  Occupational History  . Occupation: retired  Scientific laboratory technician  . Financial resource strain: Not on file  . Food insecurity:    Worry: Not on file    Inability: Not on file  . Transportation needs:    Medical: Not on file     Non-medical: Not on file  Tobacco Use  . Smoking status: Current Every Day Smoker    Packs/day: 1.00    Years: 60.00    Pack years: 60.00    Types: Cigarettes  . Smokeless tobacco: Never Used  .  Tobacco comment: smoker since 77 yo.  1.5 ppd of [place] 100 lights. ; form given 10-23-16  Substance and Sexual Activity  . Alcohol use: No    Alcohol/week: 0.0 standard drinks  . Drug use: No    Comment: No hx of IV drug use  . Sexual activity: Not Currently    Birth control/protection: None  Lifestyle  . Physical activity:    Days per week: Not on file    Minutes per session: Not on file  . Stress: Not on file  Relationships  . Social connections:    Talks on phone: Not on file    Gets together: Not on file    Attends religious service: Not on file    Active member of club or organization: Not on file    Attends meetings of clubs or organizations: Not on file    Relationship status: Not on file  Other Topics Concern  . Not on file  Social History Narrative   ** Merged History Encounter **       Lives in [place] with Daughter, granddaughters (twins) and mentally challenged niece.   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   Patient has a college education.   Patient drinks one cup of caffeine daily.            Family History  Problem Relation Age of Onset  . Thyroid cancer Mother   . Heart disease Father   . Hypertension Father   . Heart attack Father   . Lupus Daughter   . Breast cancer Sister   . Thyroid cancer Sister   . Kidney disease Sister   . Colon cancer Neg Hx    Scheduled Meds: . acetaminophen  1,000 mg Oral Q8H  . allopurinol  100 mg Oral Q T,Th,S,Su  . amLODipine  5 mg Oral QHS  . atorvastatin  40 mg Oral QHS  . Chlorhexidine Gluconate Cloth  6 each Topical Q0600  . cinacalcet  90 mg Oral QHS  . diclofenac sodium  2 g Topical QID  . doxercalciferol  1 mcg Intravenous Q M,W,F-HD  . DULoxetine  60 mg Oral QHS  . feeding supplement  (NEPRO CARB STEADY)  237 mL Oral BID BM  . feeding supplement (PRO-STAT SUGAR FREE 64)  30 mL Oral BID  . lidocaine  1 patch Transdermal Q24H  . losartan  100 mg Oral QHS  . metoprolol succinate  100 mg Oral QHS  . mometasone-formoterol  2 puff Inhalation BID  . multivitamin  1 tablet Oral QHS  . polyethylene glycol  17 g Oral Daily  . pregabalin  100 mg Oral QHS  . senna-docusate  1 tablet Oral BID  . sevelamer carbonate  2,400 mg Oral TID WC  . traZODone  50 mg Oral QHS  . warfarin  5 mg Oral ONCE-1800  . Warfarin - Pharmacist Dosing Inpatient   Does not apply q1800   Continuous Infusions: . sodium chloride    . sodium chloride    . heparin 900 Units/hr (01/17/18 1800)   PRN Meds:.sodium chloride, sodium chloride, albuterol, fluticasone, heparin, lidocaine (PF), lidocaine-prilocaine, loratadine, oxyCODONE, pentafluoroprop-tetrafluoroeth, sevelamer carbonate Medications Prior to Admission:  Prior to Admission medications   Medication Sig Start Date End Date Taking? Authorizing Provider  albuterol (PROVENTIL HFA;VENTOLIN HFA) 108 (90 Base) MCG/ACT inhaler Inhale 2 puffs into the lungs every 4 (four) hours as needed for wheezing or shortness of breath. 03/11/17  Yes Martinique, Betty G, MD  allopurinol [facility])  100 MG tablet Take 100 mg by mouth once daily on non-dialysis days (Tues/Thurs/Sat/Sun) Patient taking differently: Take 100 mg by mouth See admin instructions. Take one tablet (100 mg) by mouth once daily on non-dialysis days (Tues/Thurs/Sat/Sun) 11/13/15  Yes Smiley Houseman, MD  amLODipine (NORVASC) 10 MG tablet TAKE 1 TABLET BY MOUTH AT BEDTIME Patient taking differently: Take 10 mg by mouth at bedtime.  06/01/17  Yes Martinique, Betty G, MD  atorvastatin (LIPITOR) 40 MG tablet Take 1 tablet (40 mg total) by mouth daily. Patient taking differently: Take 40 mg by mouth at bedtime.  02/26/15  Yes Rumley, [place] N, DO  azelastine (ASTELIN) 0.1 % nasal spray Place 1 spray into both  nostrils 2 (two) times daily. Use in each nostril as directed Patient taking differently: Place 1 spray into both nostrils 2 (two) times daily as needed for rhinitis or allergies. Use in each nostril as directed 08/07/16  Yes Martinique, Betty G, MD  B Complex-C-Zn-Folic Acid (DIALYVITE/ZINC) TABS Take 1 tablet by mouth daily. 09/25/17  Yes [provider]  cefUROXime (CEFTIN) 500 MG tablet Take 500 mg after HD days. Patient taking differently: Take 500 mg by mouth See admin instructions. Start date 01/15/18: take one tablet (500 mg) by mouth after dialysis (Monday, Wednesday, Friday) for 4 doses 01/13/18  Yes Regalado, Belkys A, MD  cinacalcet (SENSIPAR) 90 MG tablet Take 90 mg by mouth at bedtime.   Yes [provider]  DULoxetine (CYMBALTA) 30 MG capsule Take 2 capsules (60 mg total) by mouth daily. Patient taking differently: Take 60 mg by mouth at bedtime.  01/08/17  Yes Kathrynn Ducking, MD  fluticasone [facility]) 50 MCG/ACT nasal spray Place 2 sprays into both nostrils daily as needed for allergies or rhinitis. 10/29/17  Yes Martinique, Betty G, MD  hydrOXYzine (ATARAX/VISTARIL) 25 MG tablet Take 1 tablet (25 mg total) by mouth every 8 (eight) hours as needed for itching. 09/16/17  Yes Martinique, Betty G, MD  lidocaine-prilocaine (EMLA) cream Apply 1 application topically See admin instructions. Apply topically one hour before dialysis - Monday, Wednesday, Friday   Yes [provider]  loratadine (CLARITIN) 10 MG tablet 10 mg every other day. Patient taking differently: Take 10 mg by mouth daily as needed for allergies. 10 mg every other day. 10/29/17  Yes Martinique, Betty G, MD  losartan (COZAAR) 100 MG tablet Take 1 tablet (100 mg total) by mouth daily. 10/29/17  Yes Martinique, Betty G, MD  metoprolol succinate (TOPROL-XL) 100 MG 24 hr tablet TAKE 1 TABLET (100 MG)  BY MOUTH TWICE DAILY Patient taking differently: Take 100 mg by mouth 2 (two) times daily.  10/13/17  Yes Martinique, Betty G, MD    multivitamin (RENA-VIT) TABS tablet Take 1 tablet by mouth at bedtime. Patient taking differently: Take 1 tablet by mouth daily.  01/01/15  Yes Haney, Alyssa A, MD  oxyCODONE-acetaminophen (PERCOCET) 5-325 MG tablet Take 1 tablet by mouth every 8 (eight) hours as needed for severe pain. Patient taking differently: Take 0.5-1 tablets by mouth every 8 (eight) hours as needed for severe pain.  01/05/18  Yes Martinique, Betty G, MD  polyethylene glycol powder (MIRALAX) powder Take 17 g by mouth daily. 12/29/17  Yes Jola Schmidt, MD  predniSONE (DELTASONE) 20 MG tablet Take 2 tablets (40 mg total) by mouth daily with breakfast. 01/14/18  Yes Regalado, Belkys A, MD  pregabalin (LYRICA) 100 MG capsule Take 100 mg by mouth at bedtime.    Yes [provider]  sevelamer carbonate (RENVELA) 800 MG tablet Take 2,400 mg by mouth See admin instructions. Take 3 tablets (2400 mg) by mouth with each meal or snack - once or twice daily   Yes [provider]  warfarin (COUMADIN) 5 MG tablet Take 5 mg Monday Wednesday Friday and 2.5 mg all other days.check inr 7/29. Patient taking differently: Take 2.5-5 mg by mouth See admin instructions. Take 1 tablet (5 mg) on Wednesday mornings,  take 1/2 tablet (2.5 mg) on all other mornings 12/27/17  Yes Georgette Shell, MD  budesonide-formoterol [facility]) 160-4.5 MCG/ACT inhaler Inhale 2 puffs into the lungs 2 (two) times daily. 07/26/17   Bonnell Public, MD  glycopyrrolate (ROBINUL) 2 MG tablet Take 1 tablet (2 mg total) by mouth 2 (two) times daily. Patient not taking: Reported on 01/15/2018 08/25/17   Ladene Artist, MD  senna-docusate (SENOKOT-S) 8.6-50 MG tablet Take 1 tablet by mouth 2 (two) times daily. 12/27/17   Georgette Shell, MD   Allergies  Allergen Reactions  . Tuberculin Tests Hives    "blisters"  . Valacyclovir Other (See Comments)    Confusion and nervousness  . Codeine Nausea And Vomiting  . Penicillins Rash    No problems  breathing. Has tolerated omnicef in past without issue Has patient had a PCN reaction causing immediate rash, facial/tongue/throat swelling, SOB or lightheadedness with hypotension: Yes Has patient had a PCN reaction causing severe rash involving mucus membranes or skin necrosis: No Has patient had a PCN reaction that required hospitalization No Has patient had a PCN reaction occurring within the last 10 years: No If all of the above answers are "NO", then may proceed with Cephalosporin  . Sulfamethoxazole Rash   Review of Systems  All other systems reviewed and are negative.   Physical Exam  Constitutional: She is oriented to person, place, and time. No distress.  Pulmonary/Chest: Effort normal.  Neurological: She is alert and oriented to person, place, and time.  Skin: Skin is warm and dry.    Vital Signs: BP (!) 150/71 (BP Location: Right Arm)   Pulse 83   Temp 97.7 F (36.5 C) (Oral)   Resp (!) 21   Ht '5\' 1"'$  (1.549 m)   Wt 67.1 kg   SpO2 97%   BMI 27.95 kg/m  Pain Scale: 0-10   Pain Score: 0-No pain   SpO2: SpO2: 97 % O2 Device:SpO2: 97 % O2 Flow Rate: .O2 Flow Rate (L/min): 2 L/min  IO: Intake/output summary:   Intake/Output Summary (Last 24 hours) at 01/18/2018 1417 Last data filed at 01/18/2018 1100 Gross per 24 hour  Intake 233.18 ml  Output -  Net 233.18 ml    LBM: Last BM Date: 01/17/18 Baseline Weight: Weight: 68.5 kg Most recent weight: Weight: 67.1 kg     Palliative Assessment/Data:     Time In: 1:45 Time Out: 2:55 Time Total: 70 min Greater than 50%  of this time was spent counseling and coordinating care related to the above assessment and plan.  Signed by: Asencion Gowda, NP   Please contact Palliative Medicine Team phone at [phone] for questions and concerns.  For individual provider: See Shea Evans

## 2018-01-18 NOTE — Progress Notes (Signed)
Palliative (Crystal) requested chaplain get POA done with patient as soon as possible.  She is in good mental status today.  Crystal is concerned she may not be later.  Chaplain attempted to do this however, due to timing it was not possible.  Patient had gone up for hemodialysis and cant have notary and witnesses in there and volunteers are also not available.  I understand there is an estranged husband and that the patient is concerned about who is in the room as she decides on the POA.  Will have to revisit on Tuesday. Conard Novak, Chaplain   01/18/18 1500  Clinical Encounter Type  Visited With Patient not available;Other (Comment) (Palliative requested Health care Power of Attorney)  Visit Type Other (Comment) (AD-POA-attempted)  Referral From Palliative care team  Consult/Referral To Chaplain

## 2018-01-19 ENCOUNTER — Inpatient Hospital Stay (HOSPITAL_COMMUNITY): Payer: Medicare Other

## 2018-01-19 DIAGNOSIS — W19XXXA Unspecified fall, initial encounter: Secondary | ICD-10-CM

## 2018-01-19 LAB — BASIC METABOLIC PANEL
Anion gap: 12 (ref 5–15)
BUN: 8 mg/dL (ref 8–23)
CHLORIDE: 98 mmol/L (ref 98–111)
CO2: 31 mmol/L (ref 22–32)
CREATININE: 4.14 mg/dL — AB (ref 0.44–1.00)
Calcium: 7.8 mg/dL — ABNORMAL LOW (ref 8.9–10.3)
GFR calc non Af Amer: 10 mL/min — ABNORMAL LOW (ref >60)
GFR, EST AFRICAN AMERICAN: 11 mL/min — AB (ref >60)
Glucose, Bld: 70 mg/dL (ref 70–99)
POTASSIUM: 3.9 mmol/L (ref 3.5–5.1)
Sodium: 141 mmol/L (ref 135–145)

## 2018-01-19 LAB — CBC
HEMATOCRIT: 33.6 % — AB (ref 36.0–46.0)
Hemoglobin: 10.4 g/dL — ABNORMAL LOW (ref 12.0–15.0)
MCH: 27.4 pg (ref 26.0–34.0)
MCHC: 31 g/dL (ref 30.0–36.0)
MCV: 88.7 fL (ref 78.0–100.0)
Platelets: 215 10*3/uL (ref 150–400)
RBC: 3.79 MIL/uL — AB (ref 3.87–5.11)
RDW: 16.9 % — ABNORMAL HIGH (ref 11.5–15.5)
WBC: 12.6 10*3/uL — ABNORMAL HIGH (ref 4.0–10.5)

## 2018-01-19 LAB — MAGNESIUM: MAGNESIUM: 2 mg/dL (ref 1.7–2.4)

## 2018-01-19 MED ORDER — PANTOPRAZOLE SODIUM 40 MG PO TBEC
40.0000 mg | DELAYED_RELEASE_TABLET | Freq: Every day | ORAL | Status: DC
  Administered 2018-01-19 – 2018-01-20 (×2): 40 mg via ORAL
  Filled 2018-01-19 (×2): qty 1

## 2018-01-19 MED ORDER — CHLORHEXIDINE GLUCONATE CLOTH 2 % EX PADS
6.0000 | MEDICATED_PAD | Freq: Every day | CUTANEOUS | Status: DC
  Administered 2018-01-19: 6 via TOPICAL

## 2018-01-19 NOTE — Care Management Important Message (Signed)
Important Message  Patient Details  Name: [name] MRN: [id number] Date of Birth: [birth year]   Medicare Important Message Given:  Yes    Magenta Schmiesing 01/19/2018, 4:18 PM

## 2018-01-19 NOTE — Consult Note (Signed)
Reason for Consult:[place]ia fx Referring Physician: A Kuulei Kleier is an 77 y.o. female.  HPI: [name] fell at home on or about 8/15 after a recent hospitalization. She was brought to the hospital and was noted to have some confusion. Workup did not demonstrate much of note but x-rays suggested a pubic ramus fx. She was admitted for her AMS. As she kept c/o pain in her RLE MRI's were obtained of the pelvis and knee. It did not show a pubic ramus fx but did show some muscle strain but the knee showed a non-displaced posterior tibia plateau fx and orthopedic surgery was consulted.  Past Medical History:  Diagnosis Date  . Abnormality of gait 03/28/2015  . Adrenal mass ([place])   . 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 Arthritis   . Back pain   . CHF (congestive heart failure) ([place])   . Chronic kidney disease    Hemo MWF  . Congestion of throat    Pt states she has a lot mucus in back throat.  . Constipation   . COPD (chronic obstructive pulmonary disease) ([place])   . Depression   . Dialysis patient [facility])    kidney  . Diverticulitis   . GERD (gastroesophageal reflux disease)   . H/O hiatal hernia   . High cholesterol   . Hoarseness of voice   . Hyperlipidemia   . Hypertension   . IBS (irritable bowel syndrome)   . Meralgia paresthetica of right side 12/26/2014  . Normal cardiac stress test 12/24/2009   lexiscan, imaging normal  . PAD (peripheral artery disease) ([place]) 12/21/2017  . Pneumonia    [id number]  . Renal disorder   . RLS (restless legs syndrome)   . Seizures ([place])    2004 past brain surgery  . Sinus complaint   . Stroke ([place])    TIAs per patient 2 or 3  . Thyroid disease   . TIA (transient ischemic attack)   . Tubular adenoma of colon 01/2008    Past Surgical History:  Procedure Laterality Date  . A/V SHUNT INTERVENTION N/A 08/13/2017   Procedure: A/V SHUNT INTERVENTION;  Surgeon: Algernon Huxley, MD;  Location: [place] [facility];  Service: Cardiovascular;  Laterality: N/A;  . A/V SHUNTOGRAM Left 08/13/2017   Procedure: A/V SHUNTOGRAM;  Surgeon: Algernon Huxley, MD;  Location: [place] [facility];  Service: Cardiovascular;  Laterality: Left;  . ABDOMINAL HYSTERECTOMY    . ANAL RECTAL MANOMETRY N/A 07/25/2015   Procedure: ANO RECTAL MANOMETRY;  Surgeon: Mauri Pole, MD;  [facility];  Service: Endoscopy;  Laterality: N/A;  . APPENDECTOMY    . AV FISTULA PLACEMENT Left 11/11/2012   Procedure: INSERTION OF ARTERIOVENOUS (AV) GORE-TEX GRAFT ARM;  Surgeon: Angelia Mould, MD;  Location: [place];  Service: Vascular;  Laterality: Left;  . [place] REMOVAL Left 11/18/2012   Procedure: REMOVAL OF LEFT UPPER ARM ARTERIOVENOUS GORETEX GRAFT ([place]);  Surgeon: Angelia Mould, MD;  Location: [place];  Service: Vascular;  Laterality: Left;  . CARDIAC CATHETERIZATION  2003   normal  . CHOLECYSTECTOMY     Open mid line incision  . frontal craniotomy  2002   indication = sinusitis  . INSERTION OF DIALYSIS CATHETER     Left  . LOWER EXTREMITY ANGIOGRAPHY Right 12/17/2017   Procedure: LOWER EXTREMITY ANGIOGRAPHY;  Surgeon: Algernon Huxley, MD;  Location: [place] [facility];  Service: Cardiovascular;  Laterality: Right;  . NASAL SINUS SURGERY Bilateral 2002  . PATCH ANGIOPLASTY Left 11/18/2012   Procedure: PATCH ANGIOPLASTY;  Surgeon: Angelia Mould, MD;  Location: [place];  Service: Vascular;  Laterality: Left;  . PERIPHERAL VASCULAR CATHETERIZATION Left 03/08/2015   Procedure: A/V Shuntogram/Fistulagram;  Surgeon: Algernon Huxley, MD;  Location: [place] [facility];  Service: Cardiovascular;  Laterality: Left;  . PERIPHERAL VASCULAR CATHETERIZATION Left 03/08/2015   Procedure: A/V Shunt Intervention;  Surgeon: Algernon Huxley, MD;  Location: [place] [facility];  Service: Cardiovascular;  Laterality: Left;  . RECTAL ULTRASOUND N/A 10/05/2015   Procedure: ANAL ULTRASOUND WITH PROBE;  Surgeon: Leighton Ruff, MD;   [facility];  Service: Endoscopy;  Laterality: N/A;  . REVISON OF ARTERIOVENOUS FISTULA  05/11/2012   Procedure: REVISON OF ARTERIOVENOUS FISTULA;  Surgeon: Elam Dutch, MD;  Location: [place];  Service: Vascular;  Laterality: Right;  . TUBAL LIGATION      Family History  Problem Relation Age of Onset  . Thyroid cancer Mother   . Heart disease Father   . Hypertension Father   . Heart attack Father   . Lupus Daughter   . Breast cancer Sister   . Thyroid cancer Sister   . Kidney disease Sister   . Colon cancer Neg Hx     Social History:  reports that she has been smoking cigarettes. She has a 60.00 pack-year smoking history. She has never used smokeless tobacco. She reports that she does not drink alcohol or use drugs.  Allergies:  Allergies  Allergen Reactions  . Tuberculin Tests Hives    "blisters"  . Valacyclovir Other (See Comments)    Confusion and nervousness  . Codeine Nausea And Vomiting  . Penicillins Rash    No problems breathing. Has tolerated omnicef in past without issue Has patient had a PCN reaction causing immediate rash, facial/tongue/throat swelling, SOB or lightheadedness with hypotension: Yes Has patient had a PCN reaction causing severe rash involving mucus membranes or skin necrosis: No Has patient had a PCN reaction that required hospitalization No Has patient had a PCN reaction occurring within the last 10 years: No If all of the above answers are "NO", then may proceed with Cephalosporin  . Sulfamethoxazole Rash    Medications: I have reviewed the patient's current medications.  Results for orders placed or performed during the hospital encounter of 01/15/18 (from the past 48 hour(s))  Heparin level (unfractionated)     Status: Abnormal   Collection Time: 01/17/18  4:52 PM  Result Value Ref Range   Heparin Unfractionated <0.10 (L) 0.30 - 0.70 IU/mL    Comment: (NOTE) If heparin results are below expected values, and patient dosage  has  been confirmed, suggest follow up testing of antithrombin III levels. Performed at [facility], [place] [address], [place], [place] [address]   Protime-INR     Status: Abnormal   Collection Time: 01/18/18  5:03 AM  Result Value Ref Range   Prothrombin Time 18.4 (H) 11.4 - 15.2 seconds   INR 1.54     Comment: Performed at [facility] [address], [place] [address]  CBC     Status: Abnormal   Collection Time: 01/18/18  5:03 AM  Result Value Ref Range   WBC 11.5 (H) 4.0 - 10.5 K/uL   RBC 4.05 3.87 - 5.11 MIL/uL   Hemoglobin 11.1 (L) 12.0 - 15.0 g/dL   HCT 35.9 (L)  36.0 - 46.0 %   MCV 88.6 78.0 - 100.0 fL   MCH 27.4 26.0 - 34.0 pg   MCHC 30.9 30.0 - 36.0 g/dL   RDW 17.0 (H) 11.5 - 15.5 %   Platelets 192 150 - 400 K/uL    Comment: Performed at [facility] [address], [place], [place] [address]  Comprehensive metabolic panel     Status: Abnormal   Collection Time: 01/18/18  5:03 AM  Result Value Ref Range   Sodium 140 135 - 145 mmol/L   Potassium 4.1 3.5 - 5.1 mmol/L   Chloride 100 98 - 111 mmol/L   CO2 29 22 - 32 mmol/L   Glucose, Bld 90 70 - 99 mg/dL   BUN 21 8 - 23 mg/dL   Creatinine, Ser 7.11 (H) 0.44 - 1.00 mg/dL    Comment: DELTA CHECK NOTED   Calcium 7.7 (L) 8.9 - 10.3 mg/dL   Total Protein 6.3 (L) 6.5 - 8.1 g/dL   Albumin 2.1 (L) 3.5 - 5.0 g/dL   AST 22 15 - 41 U/L   ALT 15 0 - 44 U/L   Alkaline Phosphatase 84 38 - 126 U/L   Total Bilirubin 1.0 0.3 - 1.2 mg/dL   GFR calc non Af Amer 5 (L) >60 mL/min   GFR calc Af Amer 6 (L) >60 mL/min    Comment: (NOTE) The eGFR has been calculated using the CKD EPI equation. This calculation has not been validated in all clinical situations. eGFR's persistently <60 mL/min signify possible Chronic Kidney Disease.    Anion gap 11 5 - 15    Comment: Performed at [facility] [address], [place], [place] [address]  Magnesium     Status: None   Collection Time: 01/18/18  5:03 AM   Result Value Ref Range   Magnesium 2.3 1.7 - 2.4 mg/dL    Comment: Performed at [facility] [address], [place], [place] [address]  Heparin level (unfractionated)     Status: None   Collection Time: 01/18/18  5:03 AM  Result Value Ref Range   Heparin Unfractionated 0.43 0.30 - 0.70 IU/mL    Comment: (NOTE) If heparin results are below expected values, and patient dosage has  been confirmed, suggest follow up testing of antithrombin III levels. Performed at [facility], [place] [address], [place], [place] [address]   CBC     Status: Abnormal   Collection Time: 01/19/18  3:35 AM  Result Value Ref Range   WBC 12.6 (H) 4.0 - 10.5 K/uL   RBC 3.79 (L) 3.87 - 5.11 MIL/uL   Hemoglobin 10.4 (L) 12.0 - 15.0 g/dL   HCT 33.6 (L) 36.0 - 46.0 %   MCV 88.7 78.0 - 100.0 fL   MCH 27.4 26.0 - 34.0 pg   MCHC 31.0 30.0 - 36.0 g/dL   RDW 16.9 (H) 11.5 - 15.5 %   Platelets 215 150 - 400 K/uL    Comment: Performed at [facility], [place] [address], [place], [place] [address]  Basic metabolic panel     Status: Abnormal   Collection Time: 01/19/18  3:35 AM  Result Value Ref Range   Sodium 141 135 - 145 mmol/L   Potassium 3.9 3.5 - 5.1 mmol/L   Chloride 98 98 - 111 mmol/L   CO2 31 22 - 32 mmol/L   Glucose, Bld 70 70 - 99 mg/dL   BUN 8 8 - 23  mg/dL   Creatinine, Ser 4.14 (H) 0.44 - 1.00 mg/dL    Comment: DELTA CHECK NOTED DIALYSIS    Calcium 7.8 (L) 8.9 - 10.3 mg/dL   GFR calc non Af Amer 10 (L) >60 mL/min   GFR calc Af Amer 11 (L) >60 mL/min    Comment: (NOTE) The eGFR has been calculated using the CKD EPI equation. This calculation has not been validated in all clinical situations. eGFR's persistently <60 mL/min signify possible Chronic Kidney Disease.    Anion gap 12 5 - 15    Comment: Performed at [facility] [address] [place], [place] [address]  Magnesium     Status: None   Collection Time: 01/19/18  3:35 AM  Result Value Ref Range   Magnesium 2.0  1.7 - 2.4 mg/dL    Comment: Performed at [facility] [address], [place], [place] [address]    Mr Knee Right Wo Contrast  Result Date: 01/19/2018 CLINICAL DATA:  Right knee pain and swelling. Two falls in the last 24 hours. EXAM: MRI OF THE RIGHT KNEE WITHOUT CONTRAST TECHNIQUE: Multiplanar, multisequence MR imaging of the knee was performed. No intravenous contrast was administered. COMPARISON:  Radiographs dated 01/15/2018 FINDINGS: MENISCI Medial meniscus:  Intact. Lateral meniscus: Intact but diffusely intrinsically degenerated with some fraying of the superior surface of the anterior horn. LIGAMENTS Cruciates:  Normal. Collaterals:  Normal. CARTILAGE Patellofemoral:  Normal. Medial:  Normal. Lateral: Tiny focal fissure in the posterior central aspect of the femoral condyle. Joint: Moderate joint effusion. Debris and synovial hypertrophy in the joint. Popliteal Fossa: Small Baker's cyst containing debris. Intact popliteus tendon. Extensor Mechanism:  Normal. Bones: Tiny nondisplaced subcortical fracture of the posterior margin of lateral tibial plateau best seen on images 7 and 8 of series 19. No other significant bone abnormality. Other: None IMPRESSION: 1. Tiny nondisplaced fracture of the posterior rim of the lateral tibial plateau. 2. Moderate joint effusion with some debris in the joint. 3. Small Baker's cyst containing debris. 4. Degenerative changes of the lateral meniscus as described. Electronically Signed   By: Lorriane Shire M.D.   On: 01/19/2018 11:47    Review of Systems  Constitutional: Negative for weight loss.  HENT: Negative for ear discharge, ear pain, hearing loss and tinnitus.   Eyes: Negative for blurred vision, double vision, photophobia and pain.  Respiratory: Negative for cough, sputum production and shortness of breath.   Cardiovascular: Negative for chest pain.  Gastrointestinal: Negative for abdominal pain, nausea and vomiting.  Genitourinary: Negative for  dysuria, flank pain, frequency and urgency.  Musculoskeletal: Positive for joint pain (Right knee, pelvis). Negative for back pain, falls, myalgias and neck pain.  Neurological: Negative for dizziness, tingling, sensory change, focal weakness, loss of consciousness and headaches.  Endo/Heme/Allergies: Does not bruise/bleed easily.  Psychiatric/Behavioral: Negative for depression, memory loss and substance abuse. The patient is not nervous/anxious.    Blood pressure (!) 122/42, pulse 79, temperature 97.8 F (36.6 C), temperature source Oral, resp. rate 16, height '5\' 1"'$  (1.549 m), weight 63.8 kg, SpO2 100 %. Physical Exam  Constitutional: She appears well-developed and well-nourished. No distress.  HENT:  Head: Normocephalic and atraumatic.  Eyes: Conjunctivae are normal. Right eye exhibits no discharge. Left eye exhibits no discharge. No scleral icterus.  Neck: Normal range of motion.  Cardiovascular: Normal rate and regular rhythm.  Respiratory: Effort normal. No respiratory distress.  Musculoskeletal:  RLE No traumatic wounds, ecchymosis, or rash  TTP diffuse knee  Small knee effusion  Knee grossly stable to varus/ valgus and anterior/posterior stress  Sens DPN, SPN, TN intact  Motor EHL, ext, flex, evers 5/5  DP 1+, PT 1+, No significant edema  Neurological: She is alert.  Skin: Skin is warm and dry. She is not diaphoretic.  Psychiatric: She has a normal mood and affect. Her behavior is normal.    Assessment/Plan: Right tibia plateau fx -- Should be TDWB for ~2 weeks. No bracing necessary. Should f/u with Dr. Griffin Basil in that same time frame. Pelvic muscle strain -- Her WB status should help this as well ESRD on HD HTN PAD Hx/o DVT on anticoagulation    Lisette Abu, PA-C Orthopedic Surgery [phone] 01/19/2018, 2:58 PM

## 2018-01-19 NOTE — Progress Notes (Signed)
Daily Progress Note   Patient Name: [name]       Date: 01/19/2018 DOB: 10-24-1940  Age: 77 y.o. MRN#: [id number] Attending Physician: [name]., * Primary Care Physician: [place], Betty G, MD Admit Date: 01/15/2018  Reason for Consultation/Follow-up: Psychosocial/spiritual support  Subjective: Patient with visitor at bedside whom she identifies as her husband. She introduced me as a friend coming to check on her. Allowed patient to drive conversation. She states she is doing well this morning.  [name] tells me she received directive papers this morning, but was not able to complete them as her husband (whom she has been separated from for years) arrived around the same time as the chaplain. She would like for the chaplain to come to visit her between 2-4 today, as she will have no visitors at that time.  She states she is supposed to have an MRI this morning, and requests to let the nurse know she is ready to go for the procedure.   Length of Stay: 3  Current Medications: Scheduled Meds:  . acetaminophen  1,000 mg Oral Q8H  . allopurinol  100 mg Oral Q T,Th,S,Su  . amLODipine  5 mg Oral QHS  . atorvastatin  40 mg Oral QHS  . Chlorhexidine Gluconate Cloth  6 each Topical Q0600  . Chlorhexidine Gluconate Cloth  6 each Topical Q0600  . cinacalcet  90 mg Oral QHS  . clopidogrel  75 mg Oral Daily  . diclofenac sodium  2 [name] Topical QID  . doxercalciferol  1 mcg Intravenous Q M,W,F-HD  . DULoxetine  60 mg Oral QHS  . feeding supplement (NEPRO CARB STEADY)  237 mL Oral BID BM  . feeding supplement (PRO-STAT SUGAR FREE 64)  30 mL Oral BID  . heparin injection (subcutaneous)  5,000 Units Subcutaneous Q8H  . lidocaine  1 patch Transdermal Q24H  . losartan  100 mg Oral QHS  .  metoprolol succinate  100 mg Oral QHS  . mometasone-formoterol  2 puff Inhalation BID  . multivitamin  1 tablet Oral QHS  . polyethylene glycol  17 [name] Oral Daily  . pregabalin  100 mg Oral QHS  . senna-docusate  1 tablet Oral BID  . sevelamer carbonate  2,400 mg Oral TID WC  . traZODone  50 mg Oral QHS  Continuous Infusions:   PRN Meds: albuterol, fluticasone, loratadine, oxyCODONE, sevelamer carbonate  Physical Exam  Constitutional: No distress.  Pulmonary/Chest: Effort normal.  Neurological: She is alert.  Oriented            Vital Signs: BP (!) 92/58 (BP Location: Left Leg)   Pulse 80   Temp (!) 97.5 F (36.4 C) (Axillary)   Resp 15   Ht 5\' 1"  (1.549 m)   Wt 63.8 kg   SpO2 100%   BMI 26.58 kg/m  SpO2: SpO2: 100 % O2 Device: O2 Device: Room Air O2 Flow Rate: O2 Flow Rate (L/min): 2 L/min  Intake/output summary:   Intake/Output Summary (Last 24 hours) at 01/19/2018 1013 Last data filed at 01/18/2018 1848 Gross per 24 hour  Intake 240 ml  Output 619 ml  Net -379 ml   LBM: Last BM Date: 01/17/18 Baseline Weight: Weight: 68.5 kg Most recent weight: Weight: 63.8 kg       Palliative Assessment/Data:    Flowsheet Rows     Most Recent Value  Intake Tab  Referral Department  Hospitalist  Unit at Time of Referral  Med/Surg Unit  Palliative Care Primary Diagnosis  Trauma  Date Notified  01/18/18  Palliative Care Type  New Palliative care  Reason for referral  Clarify Goals of Care  Date of Admission  01/15/18  Date first seen by Palliative Care  01/18/18  # of days Palliative referral response time  0 Day(s)  # of days IP prior to Palliative referral  3  Clinical Assessment  Psychosocial & Spiritual Assessment  Palliative Care Outcomes      Patient Active Problem List   Diagnosis Date Noted  . Fall 01/16/2018  . Fracture of multiple pubic rami ([place]) 01/15/2018  . Closed fracture of multiple pubic rami, right, initial encounter ([place]) 01/15/2018  .  History of DVT of lower extremity 01/12/2018  . Prolonged QT interval 01/12/2018  . Long term (current) use of anticoagulants 01/04/2018  . Right leg pain 12/21/2017  . PAD (peripheral artery disease) ([place]) 12/21/2017  . Leukocytosis 12/21/2017  . Hyperlipidemia 12/01/2017  . Atherosclerosis of native arteries of extremity with rest pain ([place]) 12/01/2017  . LLQ abdominal pain 08/10/2017  . Left bundle branch block 07/24/2017  . Chest pain 07/24/2017  . Weakness 12/23/2016  . Generalized weakness 12/22/2016  . HCAP (healthcare-associated pneumonia) 12/06/2016  . Hypokalemia 12/06/2016  . Chronic systolic CHF (congestive heart failure) ([place]) 12/06/2016  . Osteopenia 09/08/2016  . Allergic rhinitis 08/07/2016  . BMI 32.0-32.9,adult 08/07/2016  . Onychomycosis 05/09/2016  . Nonspecific chest pain 04/19/2016  . Leg pain, bilateral 12/05/2015  . Trouble in sleeping 11/22/2015  . End stage renal disease ([place])   . Acute on chronic respiratory failure with hypoxia ([place]) 11/12/2015  . ESRD on dialysis ([place])   . Chronic obstructive pulmonary disease ([place])   . Hypertensive urgency 09/20/2015  . Fecal incontinence   . Adverse effects of medication 06/21/2015  . Herpes 06/19/2015  . Skin lesion 06/16/2015  . Recurrent genital herpes 06/16/2015  . COPD exacerbation ([place]) 06/05/2015  . Onychocryptosis 04/08/2015  . Unstable gait 03/28/2015  . Renal mass   . Hypertension   . Inadequate pain control 12/30/2014  . CHF (congestive heart failure) ([place]) 12/30/2014  . Meralgia paresthetica of right side 12/26/2014  . Back pain 06/22/2014  . Hereditary and idiopathic peripheral neuropathy 02/15/2013  . Dermatitis of face 08/19/2012  . HIP PAIN, BILATERAL 12/07/2008  . BLADDER PROLAPSE  11/17/2007  . OSTEOARTHRITIS, GENERALIZED, MULTIPLE JOINTS 08/18/2007  . 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 Anxiety and depression 04/23/2007  . Anemia of chronic disease 03/31/2007  .  TOBACCO ABUSE 12/10/2006  . HIATAL HERNIA 12/10/2006  . Dysphagia 12/10/2006  . HYPERCHOLESTEROLEMIA 07/30/2006  . Gout, unspecified 07/30/2006  . Major depression in partial remission ([place]) 07/30/2006  . Hypertensive renal disease, malignant, with renal failure 07/30/2006  . GASTROESOPHAGEAL REFLUX, NO ESOPHAGITIS 07/30/2006    Palliative Care Assessment & Plan    Recommendations/Plan:  Contacted chaplain service to meet with patient between 2-4 today primarily for POA papers and possibly living will.     Code Status:    Code Status Orders  (From admission, onward)         Start     Ordered   01/19/18 0944  Do not attempt resuscitation (DNR)  Continuous    Question Answer Comment  In the event of cardiac or respiratory ARREST Do not call a "code blue"   In the event of cardiac or respiratory ARREST Do not perform Intubation, CPR, defibrillation or ACLS   In the event of cardiac or respiratory ARREST Use medication by any route, position, wound care, and other measures to relive pain and suffering. May use oxygen, suction and manual treatment of airway obstruction as needed for comfort.      01/19/18 0943        Code Status History    Date Active Date Inactive Code Status Order ID Comments User Context   01/15/2018 2323 01/19/2018 0943 Full Code [id number]  Rise Patience, MD ED   01/12/2018 0605 01/13/2018 2051 Full Code [id number]  Vianne Bulls, MD ED   12/21/2017 1122 12/27/2017 1621 Full Code [id number]  Rondel Jumbo, PA-C ED   07/24/2017 0112 07/26/2017 1902 Full Code [id number]  Ivor Costa, MD ED   12/22/2016 2321 12/23/2016 1624 Full Code [id number]  Ivor Costa, MD ED   12/07/2016 0227 12/08/2016 2007 Full Code [id number]  Toy Baker, MD Inpatient   04/19/2016 1411 04/20/2016 1735 Full Code [id number]  Tonette Bihari, MD ED   11/12/2015 2209 11/13/2015 2006 Full Code [id number]  Rogue Bussing, MD Inpatient   06/04/2015 0355 06/05/2015 2014 Full Code  [id number]  Janora Norlander, DO Inpatient   12/30/2014 2155 01/01/2015 2132 Full Code [id number]  Archie Patten, MD Inpatient   03/12/2014 1219 03/14/2014 1632 Full Code [id number]  Melvenia Needles, NP ED   03/02/2012 0518 03/03/2012 1657 Full Code [id number]  Rudi Heap, RN ED       Prognosis:   Unable to determine  Discharge Planning:  To Be Determined  Care plan was discussed with chaplain  Thank you for allowing the Palliative Medicine Team to assist in the care of this patient.   Total Time 15 min Prolonged Time Billed  no       Greater than 50%  of this time was spent counseling and coordinating care related to the above assessment and plan.  Asencion Gowda, NP  Please contact Palliative Medicine Team phone at [phone] for questions and concerns.

## 2018-01-19 NOTE — Progress Notes (Signed)
Physical Therapy Treatment Patient Details Name: [name] MRN: [id number] DOB: Sep 08, 1940 Today's Date: 01/19/2018    History of Present Illness Pt is a 77 y.o. F with significant PMH of recent right lower extremity stent placement, history of DVT on Coumadin, CHF, chronic anemia who was recently admitted for pneumonia and discharged home who was brought to ER after 2 falls in last 24 hours.  Xray (01-15-18) shows possible R pelvic fracture, upon further inspection MRI (01/17/18) of pelvis revealed muscle strain and no evidence of fx.  Pt with c/o R knee pain with MRI (01-19-18) showing R lateral tibial plateau fx.  Pt is now TDWB per ortho PA.      PT Comments    Pt performed bed mobility and transfers during session to improve OOB function.  Pt reports she has just returned from sitting in recliner and wished to return to bed post session.  Pt is able to perform supine to sit and sit to supine unassisted but required increased time and extended effort.  Pt is slow and guarded.  She performed sit to stand with moderate assistance to boost into standing.  Pt limited due to pain in R knee.  She is able to maintain TDWB but only able to advance side steps to the R during treatment.  Continue to recommend SNF placement.  Informed [facility] hospitalists that she is more than appropriate to sit in a recliner during her dialysis tx.     Follow Up Recommendations  SNF;Supervision/Assistance - 24 hour     Equipment Recommendations  None recommended by PT    Recommendations for Other Services       Precautions / Restrictions Precautions Precautions: Fall Restrictions Weight Bearing Restrictions: Yes RLE Weight Bearing: Touchdown weight bearing Other Position/Activity Restrictions: Pt with knew tib plateau fx, and now TDWB.      Mobility  Bed Mobility Overal bed mobility: Needs Assistance Bed Mobility: Supine to Sit;Sit to Supine     Supine to sit: Supervision Sit to supine: Supervision    General bed mobility comments: Pt able to performe supine to sit and sit to supine with max VCs for strategy and technique and increased time and effort to complete, greater than 5 min to move to edge of bed.    Transfers Overall transfer level: Needs assistance Equipment used: Rolling walker (2 wheeled) Transfers: Sit to/from Stand Sit to Stand: Mod assist         General transfer comment: Cues for hand placement to push from seated surface.  Pt with reliance on LLE to achieve standing.  Moderate assistance to boost into standing.    Ambulation/Gait Ambulation/Gait assistance: Mod assist Gait Distance (Feet): 4 Feet Assistive device: Rolling walker (2 wheeled) Gait Pattern/deviations: Step-to pattern;Decreased stride length;Decreased stance time - right Gait velocity: decreased   General Gait Details: Pt with TDWB to RLE. She moves shuffling LLE to take side steps to [facility].  Mod assistance to maintain balance, Pt fatigues quickly.  Inching like motion to move during sidestepping.     Stairs             Wheelchair Mobility    Modified Rankin (Stroke Patients Only)       Balance Overall balance assessment: Needs assistance   Sitting balance-Leahy Scale: Fair       Standing balance-Leahy Scale: Poor  Cognition Arousal/Alertness: Awake/alert Behavior During Therapy: Anxious Overall Cognitive Status: Impaired/Different from baseline Area of Impairment: Problem solving;Safety/judgement;Attention                     Memory: Decreased short-term memory   Safety/Judgement: Decreased awareness of safety;Decreased awareness of deficits     General Comments: Pt reports she does not remember what the PA told her when he came to report.       Exercises      General Comments        Pertinent Vitals/Pain Pain Assessment: 0-10 Faces Pain Scale: Hurts even more Pain Location: R knee Pain Descriptors / Indicators:  Grimacing;Guarding;Moaning Pain Intervention(s): Monitored during session;Repositioned    Home Living                      Prior Function            PT Goals (current goals can now be found in the care plan section) Acute Rehab PT Goals Patient Stated Goal: none stated. pt daughter wants to seek other options for post acute rehab Potential to Achieve Goals: Fair Progress towards PT goals: Progressing toward goals    Frequency    Min 3X/week      PT Plan Current plan remains appropriate    Co-evaluation              AM-PAC PT "6 Clicks" Daily Activity  Outcome Measure  Difficulty turning over in bed (including adjusting bedclothes, sheets and blankets)?: A Lot Difficulty moving from lying on back to sitting on the side of the bed? : A Lot Difficulty sitting down on and standing up from a chair with arms (e.g., wheelchair, bedside commode, etc,.)?: Unable Help needed moving to and from a bed to chair (including a wheelchair)?: A Lot Help needed walking in hospital room?: A Lot Help needed climbing 3-5 steps with a railing? : Total 6 Click Score: 10    End of Session Equipment Utilized During Treatment: Gait belt Activity Tolerance: Patient limited by fatigue Patient left: in chair;with call bell/phone within reach;with family/visitor present Nurse Communication: Mobility status PT Visit Diagnosis: Other abnormalities of gait and mobility (R26.89);Muscle weakness (generalized) (M62.81);Pain Pain - Right/Left: Right Pain - part of body: Leg     Time: 3664-4034 PT Time Calculation (min) (ACUTE ONLY): 25 min  Charges:  $Therapeutic Activity: 23-37 mins                     Governor Rooks, PTA pager [phone]    Cristela Blue 01/19/2018, 3:24 PM

## 2018-01-19 NOTE — Progress Notes (Signed)
[facility] Progress Note   Dialysis Orders: [place] MWF 3h 86mn   69.5kg  2/2.25  LUA AVG  Hep none -Mircera 50 mcg IV q 2 weeks (last dose 01/06/18 Last HGB 11.4 01/06/18) -Hectorol 1 mcg IV TIW    Assessment/Plan: 1. Pelvic fx - fall at home. - per primary - needs to be OOB and see if she can tolerate HD in a chair before discharge. WBAT per ortho 2. ESRD -MWF.  HD tomorrow. Need to do in a bed until PT has cleared to be OOB with transfers 3. Anemia - hgb 11.1 - no ESA yet - last mircera 50 - due for dose 8/21  4. Secondary hyperparathyroidism - on sensipar hectorol/renvela 5. HTN/volume - current meds - prone to high BP in outpatient setting 6. Nutrition - alb 2.1 - on CL -looks like she needs ST clearance before diet can be advanced - add prostat 7. Deconditioning - SNF placement for rehab- needs to be able to do chair dialysis prior to d/c 8. Anxiety/depression -current meds/reassurance 9. Hx DVT -  12/23/17 - on heparin /coumadin per pharm - given fall history, risks may exceed benefits for treatment 10. Ecephalopathy/falls - MRI neg for acute infarct , left frontal encephalomalacia/left large parietal scalp hematoma 11. Goals of Care - met w/ pall care, full care for now    RKelly SplinterMD CMay Creekpager [phone]  01/19/2018, 7:58 AM    Subjective:  No new c/o today.    Objective Vitals:   01/18/18 1848 01/18/18 1941 01/18/18 2035 01/19/18 0351  BP: (!) 128/53 (!) 91/49 122/68 (!) 92/58  Pulse: 84 79 97 80  Resp: '18 18 17 15  '$ Temp: (!) 97.3 F (36.3 C) 98.6 F (37 C) 98.6 F (37 C) (!) 97.5 F (36.4 C)  TempSrc: Oral Oral Oral Axillary  SpO2: 100% 100% 100% 100%  Weight: 63.8 kg     Height:       Physical Exam General: ill appearing NAD, post scalp hematoma Heart: RRR Lungs poor effort dim BS: Abdomen: soft NT Extremities: no LE edema Dialysis Access: left HERO graft + bruit   Additional Objective Labs: Basic  Metabolic Panel: Recent Labs  Lab 01/12/18 2222 01/13/18 0824  01/17/18 0308 01/18/18 0503 01/19/18 0335  NA 139 139   < > 139 140 141  K 3.6 4.1   < > 4.4 4.1 3.9  CL 97* 101   < > 99 100 98  CO2 30 28   < > '31 29 31  '$ GLUCOSE 206* 126*   < > 71 90 70  BUN 15 19   < > '10 21 8  '$ CREATININE 6.17* 6.51*   < > 4.47* 7.11* 4.14*  CALCIUM 8.1* 8.3*   < > 8.0* 7.7* 7.8*  PHOS 2.5 3.2  --   --   --   --    < > = values in this interval not displayed.   Liver Function Tests: Recent Labs  Lab 01/16/18 0014 01/17/18 0308 01/18/18 0503  AST 32 28 22  ALT '15 17 15  '$ ALKPHOS 74 82 84  BILITOT 1.4* 1.0 1.0  PROT 6.1* 5.8* 6.3*  ALBUMIN 2.1* 2.0* 2.1*   No results for input(s): LIPASE, AMYLASE in the last 168 hours. CBC: Recent Labs  Lab 01/15/18 1708 01/16/18 0014 01/17/18 0308 01/18/18 0503 01/19/18 0335  WBC 8.5 7.8 12.8* 11.5* 12.6*  NEUTROABS 4.5  --   --   --   --  HGB 11.0* 10.4* 10.9* 11.1* 10.4*  HCT 37.8 32.6* 35.3* 35.9* 33.6*  MCV 90.6 88.6 89.1 88.6 88.7  PLT 286 143* 146* 192 215   Blood Culture    Component Value Date/Time   SDES EXPECTORATED SPUTUM 01/12/2018 2140   SDES EXPECTORATED SPUTUM 01/12/2018 2140   SPECREQUEST NONE 01/12/2018 2140   SPECREQUEST NONE Reflexed from [id number] 01/12/2018 2140   CULT RARE STAPHYLOCOCCUS AUREUS 01/12/2018 2140   REPTSTATUS 01/13/2018 FINAL 01/12/2018 2140   REPTSTATUS 01/16/2018 FINAL 01/12/2018 2140    Cardiac Enzymes: No results for input(s): CKTOTAL, CKMB, CKMBINDEX, TROPONINI in the last 168 hours. CBG: Recent Labs  Lab 01/16/18 1056 01/16/18 1058 01/16/18 1114 01/16/18 1127  GLUCAP 39* 49* 75 130*   Iron Studies: No results for input(s): IRON, TIBC, TRANSFERRIN, FERRITIN in the last 72 hours. Lab Results  Component Value Date   INR 1.54 01/18/2018   INR 1.91 01/17/2018   INR 2.36 01/16/2018   Studies/Results: Mr Lumbar Spine Wo Contrast  Result Date: 01/17/2018 CLINICAL DATA:  Right lower extremity  weakness. Multiple falls recently. EXAM: MRI LUMBAR SPINE WITHOUT CONTRAST TECHNIQUE: Multiplanar, multisequence MR imaging of the lumbar spine was performed. No intravenous contrast was administered. COMPARISON:  MRI lumbar spine dated January 17, 2017. FINDINGS: Segmentation:  Standard. Alignment:  Unchanged trace anterolisthesis at L4-L5. Vertebrae: Unchanged heterogeneous marrow signal without focal lesion. No fracture or evidence of discitis. Scattered chronic degenerative endplate changes. Conus medullaris and cauda equina: Conus extends to the L2 level. Conus and cauda equina appear normal. No epidural hematoma. Paraspinal and other soft tissues: Unchanged bilateral renal cortical atrophy and multiple simple cysts. Unchanged T1 hyperintense, T2 iso- to hypointense lesion in the left kidney, likely a hemorrhagic or proteinaceous cyst. Colonic diverticulosis. Disc levels: T9-T10: Mild disc degeneration and facet arthropathy with mild right neuroforaminal stenosis. T10-T11: Mild disc degeneration and facet arthropathy with mild right neuroforaminal stenosis. T11-T12: Mild disc degeneration and facet arthropathy with mild right neuroforaminal stenosis. T12-L1:  Negative. L1-L2:  Negative. L2-L3: Diffuse disc bulge, eccentric to the right. Mild bilateral facet arthropathy. Mild-to-moderate central spinal canal stenosis. No neuroforaminal stenosis. Findings are similar to prior study. L3-L4: Small disc bulge and moderate bilateral facet arthropathy with ligamentum flavum hypertrophy mild central spinal canal stenosis. Moderate left lateral recess stenosis. No neuroforaminal stenosis. Findings have progressed when compared to prior study. L4-L5: Diffuse disc bulge and moderate bilateral facet arthropathy with small facet joint effusions. Ligamentum flavum hypertrophy. Mild central spinal canal and bilateral recess stenosis. No neuroforaminal stenosis. Findings are similar to prior study. L5-S1: Normal disc. Severe  bilateral facet arthropathy. Mild bilateral neuroforaminal stenosis. No spinal canal stenosis. Findings are similar to prior study. IMPRESSION: 1.  No acute abnormality. 2. Multilevel degenerative changes of the lumbar spine as described above, slightly progressed at L3-L4 where there is new mild central spinal canal stenosis and moderate left lateral recess stenosis. 3. Unchanged mild to moderate central spinal canal stenosis at L2-L3. Unchanged mild bilateral neuroforaminal stenosis at L5-S1. 4. Unchanged moderate to severe lumbar facet arthropathy. Electronically Signed   By: Titus Dubin M.D.   On: 01/17/2018 14:37   Mr Pelvis Wo Contrast  Result Date: 01/17/2018 CLINICAL DATA:  Two falls over the past few days. Possible inferior pubic ramus fracture on x-ray. EXAM: MRI PELVIS WITHOUT CONTRAST TECHNIQUE: Multiplanar multisequence MR imaging of the pelvis was performed. No intravenous contrast was administered. COMPARISON:  Bilateral hip x-rays dated January 15, 2018. CT abdomen pelvis dated August 20, 2017.  FINDINGS: Bones: There is no evidence of acute fracture, dislocation or avascular necrosis. Heterogeneous marrow signal without focal lesion. Small bilateral sacroiliac joint effusions, likely degenerative. Mild degenerative changes of the pubic symphysis. Articular cartilage and labrum Articular cartilage: No focal chondral defect or subchondral signal abnormality identified. Labrum: There is no gross labral tear or paralabral abnormality. Joint or bursal effusion Joint effusion: No significant hip joint effusion. Bursae: No focal periarticular fluid collection. Muscles and tendons Muscles and tendons: The visualized sartorius, rectus femoris, gluteus, hamstring and iliopsoas tendons appear normal. Mild edema within the proximal right obturator externus and adductor brevis muscles adjacent to the pubic symphysis. Faint edema within the bilateral piriformis muscles. Mild bilateral gluteus muscle atrophy.  Other findings Miscellaneous: Prior hysterectomy. Trace presacral fluid. Sigmoid diverticulosis. IMPRESSION: 1.  No acute osseous abnormality. 2. Mild edema within the proximal right obturator externus and adductor brevis muscles could reflect strain. These results were called by telephone at the time of interpretation on 01/17/2018 at 5:00 pm to Dr. Verlon Au , who verbally acknowledged these results. Electronically Signed   By: Titus Dubin M.D.   On: 01/17/2018 17:14   Medications:  . acetaminophen  1,000 mg Oral Q8H  . allopurinol  100 mg Oral Q T,Th,S,Su  . amLODipine  5 mg Oral QHS  . atorvastatin  40 mg Oral QHS  . Chlorhexidine Gluconate Cloth  6 each Topical Q0600  . cinacalcet  90 mg Oral QHS  . clopidogrel  75 mg Oral Daily  . diclofenac sodium  2 g Topical QID  . doxercalciferol  1 mcg Intravenous Q M,W,F-HD  . DULoxetine  60 mg Oral QHS  . feeding supplement (NEPRO CARB STEADY)  237 mL Oral BID BM  . feeding supplement (PRO-STAT SUGAR FREE 64)  30 mL Oral BID  . heparin injection (subcutaneous)  5,000 Units Subcutaneous Q8H  . lidocaine  1 patch Transdermal Q24H  . losartan  100 mg Oral QHS  . metoprolol succinate  100 mg Oral QHS  . mometasone-formoterol  2 puff Inhalation BID  . multivitamin  1 tablet Oral QHS  . polyethylene glycol  17 g Oral Daily  . pregabalin  100 mg Oral QHS  . senna-docusate  1 tablet Oral BID  . sevelamer carbonate  2,400 mg Oral TID WC  . traZODone  50 mg Oral QHS

## 2018-01-19 NOTE — Progress Notes (Addendum)
PROGRESS NOTE    [name]  WNI:[id number] DOB: 06-18-1940 DOA: 01/15/2018 PCP: [place], Betty G, MD   Brief Narrative:  [name] is a 77 y.o. female with history of ESRD, carotid artery disease status post recent right lower extremity stent placement, history of DVT on Coumadin, CHF, chronic anemia who was recently admitted for pneumonia and was discharged home was brought to the ER after patient had 2 falls over the last 24 hours.  As per the patient's daughter first fall was when patient was with the health aide.  Did not lose consciousness.  Second fall was when patient was alone.  Patient's daughter heard a sound ventral check patient was on the floor.  By the time patient did not lose consciousness.  Did not have any chest pain or shortness of breath.  Had some slurred speech and difficulty speaking he was was called and was brought to the ER.  Assessment & Plan:   Principal Problem:   Fracture of multiple pubic rami (HCC) Active Problems:   Back pain   Hypertension   ESRD on dialysis ([place])   PAD (peripheral artery disease) ([place])   Long term (current) use of anticoagulants   History of DVT of lower extremity   Closed fracture of multiple pubic rami, right, initial encounter ([place])   Fall   1. Right obturator externus and adductor brevis strain  1. Scheduled APAP, oxy 2.5 mg prn.  Also voltaren, lidocaine patch. 2. Follow up T/L spine plain films as pt c/o back pain (with degenerative changes) 3. Per ED discussion with ortho, WBAT, no surgical intervention 4. Will f/u MRI pelvis - with no acute osseous abnormality 5. Per my discussion with PT/nursing, pt able to tolerate sitting in chair.   2. Acute Encephalopathy  Difficulty speaking:  2/2 pain vs missed dialysis, though BUN not that high?  I think pain may be factor and likely medications as well (per nephrology has had recurrent issues with this).  Continues to improve. 1. Discussed with neurology by EDP who recommended  MRI (negative for acute stroke) 2. Pain control for #1 3. No evidence of infection, CXR negative, follow UA 4. Delirium precautions 5. Speech for swallow -> currently dysphagia 2, thin liquid, meds crushed with puree (see note)    3. RLE weakness and pain:  I think this is most likely 2/2 pain, but pt unable to describe this well for me and also c/o difference in sensation b/n both legs (right more sensitive than L).  MRI of L spine with degenerative changes of lumbar spine (mild central spinal canal stenosis L3-4 and moderate L recess stenosis) as well as unchanged mild to moderate central spinal canal stenosis at L2-3 and bilateral neuroforaminal stenosis at L5-S1.   1. On review of previous records, the pain at least and the increased sensitivity on the R were described as recently as her most recent hospitalization.  The LE weakness was not described, but suspect they may be related.  Discussed lumbar MRI findings with NSGY who noted could f/u outpatient. 2. On 8/20 she relayed to me persistent R knee pain limiting mobility, pt very tender along medial joint line and medial portion of knee, will f/u MRI.    4. History of DVT on Coumadin  1. Coumadin per pharmacy 2. Heparin while INR subtherapeutic 3. Need to f/u with vascular as [place] from 7/30 and 7/23 read as negative, but Dr. Donnetta Hutching noted suspicion for DVT on 7/24 in his note.  Discussed this on 8/19 with vascular (Dr. Donzetta Matters) who reviewed [place] and noted no DVT present ok to d/c anticoagulation, recommended plavix for recent stent placement.  5. Peripheral arterial disease status post recent stent placement on coumadin.  stable, starting plavix per vascular.  6. ESRD on hemodialysis on Monday Wednesday and Friday 1. Renal following, appreciate assistance  7. Anemia likely from renal disease.  Stable, follow  8. Leukocytosis: mild, follow  9. Hypertension continue amlodipine, losartan, metoprolol  10. Gout: continue allopurinol  11. HLD:  continue lipitor  12. Thrombocytopenia: mild, follow  13. Insomnia: describes having trouble sleeping and "talking to herself" all night.  Denies psychotic sx.  ? Dementia/delirium.  Will ctm.  Start trazodone.  Stable.  14. Goals of care: Seen by palliative (see 8/19 note).  Has had discussions about stopping dialysis with Dr. Arty Baumgartner.  For now, she wants to continue dialysis.  Does not want this discussion with anyone else unless she lacks capacity.  Chaplain is working on [employer] to Librarian, academic.  DNR/DNI based on discussion with palliative.  She wants to treat the treatable. Palliative care should follow at SNF.  DVT prophylaxis: warfarin Code Status: full Family Communication: family at bedside Disposition Plan: pending further imaging and SNF placement   Consultants:   None at bedisde  Procedures:   none  Antimicrobials:  Anti-infectives (From admission, onward)   None     Subjective: C/o R knee pain.  Objective: Vitals:   01/18/18 1848 01/18/18 1941 01/18/18 2035 01/19/18 0351  BP: (!) 128/53 (!) 91/49 122/68 (!) 92/58  Pulse: 84 79 97 80  Resp: 18 18 17 15   Temp: (!) 97.3 F (36.3 C) 98.6 F (37 C) 98.6 F (37 C) (!) 97.5 F (36.4 C)  TempSrc: Oral Oral Oral Axillary  SpO2: 100% 100% 100% 100%  Weight: 63.8 kg     Height:        Intake/Output Summary (Last 24 hours) at 01/19/2018 0936 Last data filed at 01/18/2018 1848 Gross per 24 hour  Intake 240 ml  Output 619 ml  Net -379 ml   Filed Weights   01/16/18 1845 01/18/18 1500 01/18/18 1848  Weight: 67.1 kg 65.3 kg 63.8 kg    Examination:  General: No acute distress. Cardiovascular: Heart sounds show a regular rate, and rhythm. Lungs: Clear to auscultation bilaterally with good air movement Abdomen: Soft, nontender, nondistended  Neurological: Alert and oriented 3. Moves all extremities 4. Cranial nerves II through XII grossly intact. Skin: Warm and dry. No rashes or  lesions. Extremities: very TTP at R knee at medial joint line, pain limits full knee examination.  Palpable DP pulses to bilateral LE's. Psychiatric: Mood and affect are normal. Insight and judgment are appropriate.   Data Reviewed: I have personally reviewed following labs and imaging studies  CBC: Recent Labs  Lab 01/15/18 1708 01/16/18 0014 01/17/18 0308 01/18/18 0503 01/19/18 0335  WBC 8.5 7.8 12.8* 11.5* 12.6*  NEUTROABS 4.5  --   --   --   --   HGB 11.0* 10.4* 10.9* 11.1* 10.4*  HCT 37.8 32.6* 35.3* 35.9* 33.6*  MCV 90.6 88.6 89.1 88.6 88.7  PLT 286 143* 146* 192 951   Basic Metabolic Panel: Recent Labs  Lab 01/12/18 2222 01/13/18 0824 01/15/18 1708 01/16/18 0014 01/17/18 0308 01/18/18 0503 01/19/18 0335  NA 139 139 137 139 139 140 141  K 3.6 4.1 4.5 3.8 4.4 4.1 3.9  CL 97* 101 101 97* 99 100  98  CO2 30 28 28 26 31 29 31   GLUCOSE 206* 126* 120* 77 71 90 70  BUN 15 19 22  24* 10 21 8   CREATININE 6.17* 6.51* 1.34* 7.81* 4.47* 7.11* 4.14*  CALCIUM 8.1* 8.3* 9.5 8.2* 8.0* 7.7* 7.8*  MG  --   --   --   --  2.1 2.3 2.0  PHOS 2.5 3.2  --   --   --   --   --    GFR: Estimated Creatinine Clearance: 9.9 mL/min (A) (by C-G formula based on SCr of 4.14 mg/dL (H)). Liver Function Tests: Recent Labs  Lab 01/13/18 0824 01/15/18 1708 01/16/18 0014 01/17/18 0308 01/18/18 0503  AST  --  21 32 28 22  ALT  --  15 15 17 15   ALKPHOS  --  105 74 82 84  BILITOT  --  0.4 1.4* 1.0 1.0  PROT  --  7.8 6.1* 5.8* 6.3*  ALBUMIN 1.9* 3.5 2.1* 2.0* 2.1*   No results for input(s): LIPASE, AMYLASE in the last 168 hours. No results for input(s): AMMONIA in the last 168 hours. Coagulation Profile: Recent Labs  Lab 01/13/18 0532 01/15/18 1708 01/16/18 0014 01/17/18 0308 01/18/18 0503  INR 2.13 1.06 2.36 1.91 1.54   Cardiac Enzymes: No results for input(s): CKTOTAL, CKMB, CKMBINDEX, TROPONINI in the last 168 hours. BNP (last 3 results) No results for input(s): PROBNP in the  last 8760 hours. HbA1C: No results for input(s): HGBA1C in the last 72 hours. CBG: Recent Labs  Lab 01/16/18 1056 01/16/18 1058 01/16/18 1114 01/16/18 1127  GLUCAP 39* 49* 75 130*   Lipid Profile: No results for input(s): CHOL, HDL, LDLCALC, TRIG, CHOLHDL, LDLDIRECT in the last 72 hours. Thyroid Function Tests: No results for input(s): TSH, T4TOTAL, FREET4, T3FREE, THYROIDAB in the last 72 hours. Anemia Panel: No results for input(s): VITAMINB12, FOLATE, FERRITIN, TIBC, IRON, RETICCTPCT in the last 72 hours. Sepsis Labs: No results for input(s): PROCALCITON, LATICACIDVEN in the last 168 hours.  Recent Results (from the past 240 hour(s))  Culture, sputum-assessment     Status: None   Collection Time: 01/12/18  9:40 PM  Result Value Ref Range Status   Specimen Description EXPECTORATED SPUTUM  Final   Special Requests NONE  Final   Sputum evaluation   Final    THIS SPECIMEN IS ACCEPTABLE FOR SPUTUM CULTURE Performed at [facility], [address], [place], [place] [address]    Report Status 01/13/2018 FINAL  Final  Culture, respiratory     Status: None   Collection Time: 01/12/18  9:40 PM  Result Value Ref Range Status   Specimen Description EXPECTORATED SPUTUM  Final   Special Requests NONE Reflexed from [id number]  Final   Gram Stain   Final    MODERATE WBC PRESENT,BOTH PMN AND MONONUCLEAR NO ORGANISMS SEEN Performed at [facility], [place] [address], [place], [place] [address]    Culture RARE STAPHYLOCOCCUS AUREUS  Final   Report Status 01/16/2018 FINAL  Final   Organism ID, Bacteria STAPHYLOCOCCUS AUREUS  Final      Susceptibility   Staphylococcus aureus - MIC*    CIPROFLOXACIN <=0.5 SENSITIVE Sensitive     ERYTHROMYCIN >=8 RESISTANT Resistant     GENTAMICIN <=0.5 SENSITIVE Sensitive     OXACILLIN <=0.25 SENSITIVE Sensitive     TETRACYCLINE <=1 SENSITIVE Sensitive     VANCOMYCIN <=0.5 SENSITIVE Sensitive     TRIMETH/SULFA <=10 SENSITIVE Sensitive  CLINDAMYCIN RESISTANT Resistant     RIFAMPIN <=0.5 SENSITIVE Sensitive     Inducible Clindamycin POSITIVE Resistant     * RARE STAPHYLOCOCCUS AUREUS         Radiology Studies: Mr Lumbar Spine Wo Contrast  Result Date: 01/17/2018 CLINICAL DATA:  Right lower extremity weakness. Multiple falls recently. EXAM: MRI LUMBAR SPINE WITHOUT CONTRAST TECHNIQUE: Multiplanar, multisequence MR imaging of the lumbar spine was performed. No intravenous contrast was administered. COMPARISON:  MRI lumbar spine dated January 17, 2017. FINDINGS: Segmentation:  Standard. Alignment:  Unchanged trace anterolisthesis at L4-L5. Vertebrae: Unchanged heterogeneous marrow signal without focal lesion. No fracture or evidence of discitis. Scattered chronic degenerative endplate changes. Conus medullaris and cauda equina: Conus extends to the L2 level. Conus and cauda equina appear normal. No epidural hematoma. Paraspinal and other soft tissues: Unchanged bilateral renal cortical atrophy and multiple simple cysts. Unchanged T1 hyperintense, T2 iso- to hypointense lesion in the left kidney, likely a hemorrhagic or proteinaceous cyst. Colonic diverticulosis. Disc levels: T9-T10: Mild disc degeneration and facet arthropathy with mild right neuroforaminal stenosis. T10-T11: Mild disc degeneration and facet arthropathy with mild right neuroforaminal stenosis. T11-T12: Mild disc degeneration and facet arthropathy with mild right neuroforaminal stenosis. T12-L1:  Negative. L1-L2:  Negative. L2-L3: Diffuse disc bulge, eccentric to the right. Mild bilateral facet arthropathy. Mild-to-moderate central spinal canal stenosis. No neuroforaminal stenosis. Findings are similar to prior study. L3-L4: Small disc bulge and moderate bilateral facet arthropathy with ligamentum flavum hypertrophy mild central spinal canal stenosis. Moderate left lateral recess stenosis. No neuroforaminal stenosis. Findings have progressed when compared to prior study.  L4-L5: Diffuse disc bulge and moderate bilateral facet arthropathy with small facet joint effusions. Ligamentum flavum hypertrophy. Mild central spinal canal and bilateral recess stenosis. No neuroforaminal stenosis. Findings are similar to prior study. L5-S1: Normal disc. Severe bilateral facet arthropathy. Mild bilateral neuroforaminal stenosis. No spinal canal stenosis. Findings are similar to prior study. IMPRESSION: 1.  No acute abnormality. 2. Multilevel degenerative changes of the lumbar spine as described above, slightly progressed at L3-L4 where there is new mild central spinal canal stenosis and moderate left lateral recess stenosis. 3. Unchanged mild to moderate central spinal canal stenosis at L2-L3. Unchanged mild bilateral neuroforaminal stenosis at L5-S1. 4. Unchanged moderate to severe lumbar facet arthropathy. Electronically Signed   By: Titus Dubin M.D.   On: 01/17/2018 14:37   Mr Pelvis Wo Contrast  Result Date: 01/17/2018 CLINICAL DATA:  Two falls over the past few days. Possible inferior pubic ramus fracture on x-ray. EXAM: MRI PELVIS WITHOUT CONTRAST TECHNIQUE: Multiplanar multisequence MR imaging of the pelvis was performed. No intravenous contrast was administered. COMPARISON:  Bilateral hip x-rays dated January 15, 2018. CT abdomen pelvis dated August 20, 2017. FINDINGS: Bones: There is no evidence of acute fracture, dislocation or avascular necrosis. Heterogeneous marrow signal without focal lesion. Small bilateral sacroiliac joint effusions, likely degenerative. Mild degenerative changes of the pubic symphysis. Articular cartilage and labrum Articular cartilage: No focal chondral defect or subchondral signal abnormality identified. Labrum: There is no gross labral tear or paralabral abnormality. Joint or bursal effusion Joint effusion: No significant hip joint effusion. Bursae: No focal periarticular fluid collection. Muscles and tendons Muscles and tendons: The visualized sartorius,  rectus femoris, gluteus, hamstring and iliopsoas tendons appear normal. Mild edema within the proximal right obturator externus and adductor brevis muscles adjacent to the pubic symphysis. Faint edema within the bilateral piriformis muscles. Mild bilateral gluteus muscle atrophy. Other findings Miscellaneous: Prior hysterectomy. Trace presacral  fluid. Sigmoid diverticulosis. IMPRESSION: 1.  No acute osseous abnormality. 2. Mild edema within the proximal right obturator externus and adductor brevis muscles could reflect strain. These results were called by telephone at the time of interpretation on 01/17/2018 at 5:00 pm to Dr. Verlon Au , who verbally acknowledged these results. Electronically Signed   By: Titus Dubin M.D.   On: 01/17/2018 17:14        Scheduled Meds: . acetaminophen  1,000 mg Oral Q8H  . allopurinol  100 mg Oral Q T,Th,S,Su  . amLODipine  5 mg Oral QHS  . atorvastatin  40 mg Oral QHS  . Chlorhexidine Gluconate Cloth  6 each Topical Q0600  . Chlorhexidine Gluconate Cloth  6 each Topical Q0600  . cinacalcet  90 mg Oral QHS  . clopidogrel  75 mg Oral Daily  . diclofenac sodium  2 g Topical QID  . doxercalciferol  1 mcg Intravenous Q M,W,F-HD  . DULoxetine  60 mg Oral QHS  . feeding supplement (NEPRO CARB STEADY)  237 mL Oral BID BM  . feeding supplement (PRO-STAT SUGAR FREE 64)  30 mL Oral BID  . heparin injection (subcutaneous)  5,000 Units Subcutaneous Q8H  . lidocaine  1 patch Transdermal Q24H  . losartan  100 mg Oral QHS  . metoprolol succinate  100 mg Oral QHS  . mometasone-formoterol  2 puff Inhalation BID  . multivitamin  1 tablet Oral QHS  . polyethylene glycol  17 g Oral Daily  . pregabalin  100 mg Oral QHS  . senna-docusate  1 tablet Oral BID  . sevelamer carbonate  2,400 mg Oral TID WC  . traZODone  50 mg Oral QHS   Continuous Infusions:    LOS: 3 days    Time spent: over 30 min MDM moderate with multiple medical issues    Fayrene Helper,  MD [facility] Pager [phone]  If 7PM-7AM, please contact night-coverage [email or web address] Password [facility] 01/19/2018, 9:36 AM

## 2018-01-19 NOTE — Plan of Care (Signed)
  Problem: Pain Managment: Goal: General experience of comfort will improve Outcome: Progressing   Problem: Safety: Goal: Ability to remain free from injury will improve Outcome: Progressing   

## 2018-01-19 NOTE — Progress Notes (Signed)
   01/19/18 1000  Clinical Encounter Type  Visited With Patient;Family  Visit Type Follow-up  Referral From Palliative care team  Consult/Referral To Chaplain  Chaplain  Visited with the patient and husband to complete AD, the PT said come back at a later time because of an MRI and wanting to have a discreet atmosphere.  Come back later was her response.

## 2018-01-20 DIAGNOSIS — S32591A Other specified fracture of right pubis, initial encounter for closed fracture: Principal | ICD-10-CM

## 2018-01-20 LAB — COMPREHENSIVE METABOLIC PANEL
ALBUMIN: 2.1 g/dL — AB (ref 3.5–5.0)
ALT: 14 U/L (ref 0–44)
AST: 22 U/L (ref 15–41)
Alkaline Phosphatase: 72 U/L (ref 38–126)
Anion gap: 12 (ref 5–15)
BUN: 15 mg/dL (ref 8–23)
CHLORIDE: 98 mmol/L (ref 98–111)
CO2: 28 mmol/L (ref 22–32)
CREATININE: 5.83 mg/dL — AB (ref 0.44–1.00)
Calcium: 7.6 mg/dL — ABNORMAL LOW (ref 8.9–10.3)
GFR calc Af Amer: 7 mL/min — ABNORMAL LOW (ref >60)
GFR, EST NON AFRICAN AMERICAN: 6 mL/min — AB (ref >60)
GLUCOSE: 67 mg/dL — AB (ref 70–99)
POTASSIUM: 4.2 mmol/L (ref 3.5–5.1)
Sodium: 138 mmol/L (ref 135–145)
Total Bilirubin: 0.8 mg/dL (ref 0.3–1.2)
Total Protein: 6 g/dL — ABNORMAL LOW (ref 6.5–8.1)

## 2018-01-20 LAB — CBC
HCT: 32.8 % — ABNORMAL LOW (ref 36.0–46.0)
Hemoglobin: 10.1 g/dL — ABNORMAL LOW (ref 12.0–15.0)
MCH: 27.4 pg (ref 26.0–34.0)
MCHC: 30.8 g/dL (ref 30.0–36.0)
MCV: 88.9 fL (ref 78.0–100.0)
PLATELETS: 206 10*3/uL (ref 150–400)
RBC: 3.69 MIL/uL — ABNORMAL LOW (ref 3.87–5.11)
RDW: 17.2 % — ABNORMAL HIGH (ref 11.5–15.5)
WBC: 10.3 10*3/uL (ref 4.0–10.5)

## 2018-01-20 LAB — MAGNESIUM: Magnesium: 2 mg/dL (ref 1.7–2.4)

## 2018-01-20 MED ORDER — DOXERCALCIFEROL 4 MCG/2ML IV SOLN
INTRAVENOUS | Status: AC
  Filled 2018-01-20: qty 2

## 2018-01-20 MED ORDER — PANTOPRAZOLE SODIUM 40 MG PO TBEC
40.0000 mg | DELAYED_RELEASE_TABLET | Freq: Two times a day (BID) | ORAL | Status: DC
  Administered 2018-01-20 – 2018-01-22 (×5): 40 mg via ORAL
  Filled 2018-01-20 (×6): qty 1

## 2018-01-20 MED ORDER — NEPRO/CARBSTEADY PO LIQD
237.0000 mL | Freq: Three times a day (TID) | ORAL | Status: DC
  Administered 2018-01-21 – 2018-01-22 (×2): 237 mL via ORAL
  Filled 2018-01-20 (×9): qty 237

## 2018-01-20 MED ORDER — DARBEPOETIN ALFA 40 MCG/0.4ML IJ SOSY
40.0000 ug | PREFILLED_SYRINGE | INTRAMUSCULAR | Status: DC
  Administered 2018-01-20: 40 ug via INTRAVENOUS
  Filled 2018-01-20: qty 0.4

## 2018-01-20 MED ORDER — CLOPIDOGREL BISULFATE 75 MG PO TABS: 75.0000 mg | ORAL_TABLET | Freq: Every day | ORAL | 1 refills | Status: AC

## 2018-01-20 MED ORDER — DARBEPOETIN ALFA 40 MCG/0.4ML IJ SOSY
PREFILLED_SYRINGE | INTRAMUSCULAR | Status: AC
  Filled 2018-01-20: qty 0.4

## 2018-01-20 NOTE — Progress Notes (Addendum)
[facility] Progress Note   Dialysis Orders: [facility] MWF 3 hrs 45 min 180 NRe 400/[employer]oflow 1.5 69.5 kg 2.0K/2.25 Ca  UFP 2 LUA AVG  -No Heparin -Mircera 50 mcg IV q 2 weeks (last dose 01/06/18 Last HGB 11.4 01/06/18) -Hectorol 1 mcg IV TIW    Assessment/Plan: 1. Right  [place] plataeu fx/pelvic strain- fall at home. - per primary -WBAT per ortho, but she cannot stand up on or own and doesn't feel she can walk as Rx- for rehab -her first choice is [facility] 2. ESRD -MWF - HD today- K 4.2 - use 3 K bath today 3. Anemia - hgb 11.1> 10.1 - no ESA yet - last mircera 50 - due for dose 8/21 will resume today 4. Secondary hyperparathyroidism - on sensipar hectorol/renvela 5. HTN/volume - current meds - prone to high BP in outpatient setting 6. Nutrition - alb 2.1 -intake very poor will drink nepro as meal replacement --encouraged -  7. Deconditioning - SNF placement for rehab-  8. Anxiety/depression -current meds/reassurance 9. Hx possible  DVT -  12/23/17 -coumadin d/c'd per  primary conversation with [facility] - on plavix now 10. Ecephalopathy/falls - MRI neg for acute infarct , left frontal encephalomalacia/left large parietal scalp hematoma; back to baseline today 11. Goals of Care - appreciate palliative care/chaplain assistance - now DNR; SNF for rehab  Myriam Jacobson, PA-C [place] [phone] 01/20/2018,10:11 AM  LOS: 4 days   Pt seen, examined and agree w A/P as above.  Kelly Splinter MD [employer] pager [phone]   01/20/2018, 3:21 PM    Subjective:   Spirits better. OOB in chair. Issues with walking.  Unable to stand on her own without assistance - used to being independent Objective Vitals:   01/19/18 1406 01/19/18 1933 01/20/18 0451 01/20/18 0736  BP: (!) 122/42 (!) 156/48 (!) 138/46   Pulse: 79 80 70   Resp: 16 16 18    Temp: 97.8 F (36.6 C)  97.7 F (36.5 C)   TempSrc: Oral  Oral   SpO2: 100%  100% 92%  Weight:       Height:       Physical Exam General: NAD spirits good Heart: RRR Lungs few crackles in bases Abdomen: soft NT Extremities: no LE edema Dialysis Access: left HERO graft + bruit   Additional Objective Labs: Basic Metabolic Panel: Recent Labs  Lab 01/18/18 0503 01/19/18 0335 01/20/18 0306  NA 140 141 138  K 4.1 3.9 4.2  CL 100 98 98  CO2 29 31 28   GLUCOSE 90 70 67*  BUN 21 8 15   CREATININE 7.11* 4.14* 5.83*  CALCIUM 7.7* 7.8* 7.6*   Liver Function Tests: Recent Labs  Lab 01/17/18 0308 01/18/18 0503 01/20/18 0306  AST 28 22 22   ALT 17 15 14   ALKPHOS 82 84 72  BILITOT 1.0 1.0 0.8  PROT 5.8* 6.3* 6.0*  ALBUMIN 2.0* 2.1* 2.1*   No results for input(s): LIPASE, AMYLASE in the last 168 hours. CBC: Recent Labs  Lab 01/15/18 1708 01/16/18 0014 01/17/18 0308 01/18/18 0503 01/19/18 0335 01/20/18 0306  WBC 8.5 7.8 12.8* 11.5* 12.6* 10.3  NEUTROABS 4.5  --   --   --   --   --   HGB 11.0* 10.4* 10.9* 11.1* 10.4* 10.1*  HCT 37.8 32.6* 35.3* 35.9* 33.6* 32.8*  MCV 90.6 88.6 89.1 88.6 88.7 88.9  PLT 286 143* 146* 192 215 206   Blood Culture  Component Value Date/Time   SDES EXPECTORATED SPUTUM 01/12/2018 2140   SDES EXPECTORATED SPUTUM 01/12/2018 2140   SPECREQUEST NONE 01/12/2018 2140   SPECREQUEST NONE Reflexed from [id number] 01/12/2018 2140   CULT RARE STAPHYLOCOCCUS AUREUS 01/12/2018 2140   REPTSTATUS 01/13/2018 FINAL 01/12/2018 2140   REPTSTATUS 01/16/2018 FINAL 01/12/2018 2140    Cardiac Enzymes: No results for input(s): CKTOTAL, CKMB, CKMBINDEX, TROPONINI in the last 168 hours. CBG: Recent Labs  Lab 01/16/18 1056 01/16/18 1058 01/16/18 1114 01/16/18 1127  GLUCAP 39* 49* 75 130*   Iron Studies: No results for input(s): IRON, TIBC, TRANSFERRIN, FERRITIN in the last 72 hours. Lab Results  Component Value Date   INR 1.54 01/18/2018   INR 1.91 01/17/2018   INR 2.36 01/16/2018   Studies/Results: Mr Knee Right Wo Contrast  Result Date:  01/19/2018 CLINICAL DATA:  Right knee pain and swelling. Two falls in the last 24 hours. EXAM: MRI OF THE RIGHT KNEE WITHOUT CONTRAST TECHNIQUE: Multiplanar, multisequence MR imaging of the knee was performed. No intravenous contrast was administered. COMPARISON:  Radiographs dated 01/15/2018 FINDINGS: MENISCI Medial meniscus:  Intact. Lateral meniscus: Intact but diffusely intrinsically degenerated with some fraying of the superior surface of the anterior horn. LIGAMENTS Cruciates:  Normal. Collaterals:  Normal. CARTILAGE Patellofemoral:  Normal. Medial:  Normal. Lateral: Tiny focal fissure in the posterior central aspect of the femoral condyle. Joint: Moderate joint effusion. Debris and synovial hypertrophy in the joint. Popliteal Fossa: Small Baker's cyst containing debris. Intact popliteus tendon. Extensor Mechanism:  Normal. Bones: Tiny nondisplaced subcortical fracture of the posterior margin of lateral tibial plateau best seen on images 7 and 8 of series 19. No other significant bone abnormality. Other: None IMPRESSION: 1. Tiny nondisplaced fracture of the posterior rim of the lateral tibial plateau. 2. Moderate joint effusion with some debris in the joint. 3. Small Baker's cyst containing debris. 4. Degenerative changes of the lateral meniscus as described. Electronically Signed   By: Lorriane Shire M.D.   On: 01/19/2018 11:47   Medications:  . acetaminophen  1,000 mg Oral Q8H  . allopurinol  100 mg Oral Q T,Th,S,Su  . amLODipine  5 mg Oral QHS  . atorvastatin  40 mg Oral QHS  . Chlorhexidine Gluconate Cloth  6 each Topical Q0600  . Chlorhexidine Gluconate Cloth  6 each Topical Q0600  . cinacalcet  90 mg Oral QHS  . clopidogrel  75 mg Oral Daily  . diclofenac sodium  2 g Topical QID  . doxercalciferol  1 mcg Intravenous Q M,W,F-HD  . DULoxetine  60 mg Oral QHS  . feeding supplement (NEPRO CARB STEADY)  237 mL Oral BID BM  . feeding supplement (PRO-STAT SUGAR FREE 64)  30 mL Oral BID  .  heparin injection (subcutaneous)  5,000 Units Subcutaneous Q8H  . lidocaine  1 patch Transdermal Q24H  . losartan  100 mg Oral QHS  . metoprolol succinate  100 mg Oral QHS  . mometasone-formoterol  2 puff Inhalation BID  . multivitamin  1 tablet Oral QHS  . pantoprazole  40 mg Oral Daily  . polyethylene glycol  17 g Oral Daily  . pregabalin  100 mg Oral QHS  . senna-docusate  1 tablet Oral BID  . sevelamer carbonate  2,400 mg Oral TID WC  . traZODone  50 mg Oral QHS

## 2018-01-20 NOTE — Progress Notes (Signed)
Pt tolerated Hemodialysis sitting up on a HD recliner today.

## 2018-01-20 NOTE — Discharge Instructions (Signed)

## 2018-01-20 NOTE — Progress Notes (Signed)
SLP Cancellation Note  Patient Details Name: [name] MRN: [id number] DOB: November 26, 1940   Cancelled treatment:       Reason Eval/Treat Not Completed: Patient at procedure or test/unavailable. Unable to assess diet tolerance and provide education as pt is currently off unit for HD. Pt is also scheduled for DC today.  ST will continue efforts.  Elise Knobloch B. [facility], CCC-SLP Speech Language Pathologist [phone]  Shonna Chock 01/20/2018, 12:07 PM

## 2018-01-20 NOTE — Progress Notes (Signed)
Physical Therapy Treatment Patient Details Name: [name] MRN: [id number] DOB: 1940-12-19 Today's Date: 01/20/2018    History of Present Illness Pt is a 77 y.o. F with significant PMH of recent right lower extremity stent placement, history of DVT on Coumadin, CHF, chronic anemia who was recently admitted for pneumonia and discharged home who was brought to ER after 2 falls in last 24 hours.  Xray (01-15-18) shows possible R pelvic fracture, upon further inspection MRI (01/17/18) of pelvis revealed muscle strain and no evidence of fx.  Pt with c/o R knee pain with MRI (01-19-18) showing R lateral tibial plateau fx.  Pt is now TDWB per ortho PA.      PT Comments    Pt showing improved cognition and mobility this session. Able to ambulate in room, maintaining WB precautions with chair close behind, as pt's LLE buckles as pt fatigues. Patient would benefit from continued skilled PT to maximize functional independence and activity tolerance. Will continue to follow acutely.    Follow Up Recommendations  SNF;Supervision/Assistance - 24 hour     Equipment Recommendations  None recommended by PT    Recommendations for Other Services OT consult     Precautions / Restrictions Precautions Precautions: Fall Precaution Comments: improved R LE pain Restrictions Weight Bearing Restrictions: Yes RLE Weight Bearing: Touchdown weight bearing Other Position/Activity Restrictions: Pt with knew tib plateau fx, and now TDWB.      Mobility  Bed Mobility               General bed mobility comments: up in chair on arrival  Transfers Overall transfer level: Needs assistance Equipment used: Rolling walker (2 wheeled) Transfers: Sit to/from Stand Sit to Stand: Min assist;+2 safety/equipment         General transfer comment: Cues for hand placement on recliner chair and for WB precautions. Able to stand with toe touch on the R LE. Min A provided to steady.  Ambulation/Gait Ambulation/Gait  assistance: Min assist;+2 safety/equipment Gait Distance (Feet): 10 Feet(10x1, 5x1) Assistive device: Rolling walker (2 wheeled) Gait Pattern/deviations: Step-to pattern;Decreased stride length;Decreased stance time - right;Narrow base of support Gait velocity: decreased Gait velocity interpretation: <1.31 ft/sec, indicative of household ambulator General Gait Details: Pt able to maintain toe touch WB during ambulation. LLE beginning to buckle as pt fatigues, requiring chair close behind and seated rest breaks. Noted increased WB on R LE as pt fatigued. Min A to steady.   Stairs             Wheelchair Mobility    Modified Rankin (Stroke Patients Only)       Balance Overall balance assessment: Needs assistance Sitting-balance support: Bilateral upper extremity supported;Feet supported Sitting balance-Leahy Scale: Fair     Standing balance support: Bilateral upper extremity supported;During functional activity Standing balance-Leahy Scale: Poor                              Cognition Arousal/Alertness: Awake/alert Behavior During Therapy: WFL for tasks assessed/performed Overall Cognitive Status: Within Functional Limits for tasks assessed                                 General Comments: pt pleasent and motivated to work with therapy this session.       Exercises      General Comments        Pertinent Vitals/Pain Pain Assessment:  Faces Faces Pain Scale: Hurts a little bit Pain Location: R knee Pain Descriptors / Indicators: Grimacing;Guarding Pain Intervention(s): Monitored during session;Limited activity within patient's tolerance    Home Living                      Prior Function            PT Goals (current goals can now be found in the care plan section) Acute Rehab PT Goals Patient Stated Goal: none stated. pt daughter wants to seek other options for post acute rehab PT Goal Formulation: With patient/family Time  For Goal Achievement: 01/30/18 Potential to Achieve Goals: Fair Progress towards PT goals: Progressing toward goals    Frequency    Min 3X/week      PT Plan Current plan remains appropriate    Co-evaluation              AM-PAC PT "6 Clicks" Daily Activity  Outcome Measure  Difficulty turning over in bed (including adjusting bedclothes, sheets and blankets)?: A Lot Difficulty moving from lying on back to sitting on the side of the bed? : A Lot Difficulty sitting down on and standing up from a chair with arms (e.g., wheelchair, bedside commode, etc,.)?: Unable Help needed moving to and from a bed to chair (including a wheelchair)?: A Little Help needed walking in hospital room?: A Little Help needed climbing 3-5 steps with a railing? : Total 6 Click Score: 12    End of Session Equipment Utilized During Treatment: Gait belt Activity Tolerance: Patient tolerated treatment well Patient left: in chair;with call bell/phone within reach Nurse Communication: Mobility status PT Visit Diagnosis: Other abnormalities of gait and mobility (R26.89);Muscle weakness (generalized) (M62.81);Pain Pain - Right/Left: Right Pain - part of body: Leg     Time: 0388-8280 PT Time Calculation (min) (ACUTE ONLY): 14 min  Charges:  $Gait Training: 8-22 mins                    Benjiman Core, [place] Pager [phone] Acute Rehab   Allena Katz 01/20/2018, 12:25 PM

## 2018-01-20 NOTE — Progress Notes (Deleted)
[facility] Progress Note   Dialysis Orders: [place] MWF 3h 15mn   69.5kg  2/2.25  LUA AVG  Hep none -Mircera 50 mcg IV q 2 weeks (last dose 01/06/18 Last HGB 11.4 01/06/18) -Hectorol 1 mcg IV TIW    Assessment/Plan: 1. Fall/ R tibial plateau fracture - fall at home. Seen by ortho, non-surgical Rx  2. ESRD -MWF.  HD today in the chair. 3. Anemia - hgb 11.1 - no ESA yet - last mircera 50 - due for dose 8/21  4. Secondary hyperparathyroidism - on sensipar hectorol/renvela 5. HTN/volume - current meds - prone to high BP in outpatient setting 6. Deconditioning - SNF placement for rehab- HD in chair today 7. Anxiety/depression -current meds/reassurance 8. Hx DVT -  12/23/17 - on heparin /coumadin per pharm - given fall history, risks may exceed benefits for treatment 9. Encephalopathy/falls - MRI neg for acute infarct , left frontal encephalomalacia/left large parietal scalp hematoma 10. Goals of Care - prior to this event patient was seriously considering withdrawal from dialysis, but now has had change of heart. Met w/ pall care, full care for now.    RKelly SplinterMD CNewell Rubbermaidpager [phone]  01/20/2018, 10:21 AM    Subjective:  No new c/o today.    Objective Vitals:   01/19/18 1406 01/19/18 1933 01/20/18 0451 01/20/18 0736  BP: (!) 122/42 (!) 156/48 (!) 138/46   Pulse: 79 80 70   Resp: '16 16 18   '$ Temp: 97.8 F (36.6 C)  97.7 F (36.5 C)   TempSrc: Oral  Oral   SpO2: 100%  100% 92%  Weight:      Height:       Physical Exam General: ill appearing NAD, post scalp hematoma Heart: RRR Lungs poor effort dim BS: Abdomen: soft NT Extremities: no LE edema Dialysis Access: left HERO graft + bruit   Additional Objective Labs: Basic Metabolic Panel: Recent Labs  Lab 01/18/18 0503 01/19/18 0335 01/20/18 0306  NA 140 141 138  K 4.1 3.9 4.2  CL 100 98 98  CO2 '29 31 28  '$ GLUCOSE 90 70 67*  BUN '21 8 15  '$ CREATININE 7.11* 4.14* 5.83*   CALCIUM 7.7* 7.8* 7.6*   Liver Function Tests: Recent Labs  Lab 01/17/18 0308 01/18/18 0503 01/20/18 0306  AST '28 22 22  '$ ALT '17 15 14  '$ ALKPHOS 82 84 72  BILITOT 1.0 1.0 0.8  PROT 5.8* 6.3* 6.0*  ALBUMIN 2.0* 2.1* 2.1*   No results for input(s): LIPASE, AMYLASE in the last 168 hours. CBC: Recent Labs  Lab 01/15/18 1708 01/16/18 0014 01/17/18 0308 01/18/18 0503 01/19/18 0335 01/20/18 0306  WBC 8.5 7.8 12.8* 11.5* 12.6* 10.3  NEUTROABS 4.5  --   --   --   --   --   HGB 11.0* 10.4* 10.9* 11.1* 10.4* 10.1*  HCT 37.8 32.6* 35.3* 35.9* 33.6* 32.8*  MCV 90.6 88.6 89.1 88.6 88.7 88.9  PLT 286 143* 146* 192 215 206   Blood Culture    Component Value Date/Time   SDES EXPECTORATED SPUTUM 01/12/2018 2140   SDES EXPECTORATED SPUTUM 01/12/2018 2140   SPECREQUEST NONE 01/12/2018 2140   SPECREQUEST NONE Reflexed from [id number] 01/12/2018 2140   CULT RARE STAPHYLOCOCCUS AUREUS 01/12/2018 2140   REPTSTATUS 01/13/2018 FINAL 01/12/2018 2140   REPTSTATUS 01/16/2018 FINAL 01/12/2018 2140    Cardiac Enzymes: No results for input(s): CKTOTAL, CKMB, CKMBINDEX, TROPONINI in the last 168 hours. CBG: Recent Labs  Lab  01/16/18 1056 01/16/18 1058 01/16/18 1114 01/16/18 1127  GLUCAP 39* 49* 75 130*   Iron Studies: No results for input(s): IRON, TIBC, TRANSFERRIN, FERRITIN in the last 72 hours. Lab Results  Component Value Date   INR 1.54 01/18/2018   INR 1.91 01/17/2018   INR 2.36 01/16/2018   Studies/Results: Mr Knee Right Wo Contrast  Result Date: 01/19/2018 CLINICAL DATA:  Right knee pain and swelling. Two falls in the last 24 hours. EXAM: MRI OF THE RIGHT KNEE WITHOUT CONTRAST TECHNIQUE: Multiplanar, multisequence MR imaging of the knee was performed. No intravenous contrast was administered. COMPARISON:  Radiographs dated 01/15/2018 FINDINGS: MENISCI Medial meniscus:  Intact. Lateral meniscus: Intact but diffusely intrinsically degenerated with some fraying of the superior  surface of the anterior horn. LIGAMENTS Cruciates:  Normal. Collaterals:  Normal. CARTILAGE Patellofemoral:  Normal. Medial:  Normal. Lateral: Tiny focal fissure in the posterior central aspect of the femoral condyle. Joint: Moderate joint effusion. Debris and synovial hypertrophy in the joint. Popliteal Fossa: Small Baker's cyst containing debris. Intact popliteus tendon. Extensor Mechanism:  Normal. Bones: Tiny nondisplaced subcortical fracture of the posterior margin of lateral tibial plateau best seen on images 7 and 8 of series 19. No other significant bone abnormality. Other: None IMPRESSION: 1. Tiny nondisplaced fracture of the posterior rim of the lateral tibial plateau. 2. Moderate joint effusion with some debris in the joint. 3. Small Baker's cyst containing debris. 4. Degenerative changes of the lateral meniscus as described. Electronically Signed   By: Lorriane Shire M.D.   On: 01/19/2018 11:47   Medications:  . acetaminophen  1,000 mg Oral Q8H  . allopurinol  100 mg Oral Q T,Th,S,Su  . amLODipine  5 mg Oral QHS  . atorvastatin  40 mg Oral QHS  . Chlorhexidine Gluconate Cloth  6 each Topical Q0600  . Chlorhexidine Gluconate Cloth  6 each Topical Q0600  . cinacalcet  90 mg Oral QHS  . clopidogrel  75 mg Oral Daily  . diclofenac sodium  2 g Topical QID  . doxercalciferol  1 mcg Intravenous Q M,W,F-HD  . DULoxetine  60 mg Oral QHS  . feeding supplement (NEPRO CARB STEADY)  237 mL Oral BID BM  . feeding supplement (PRO-STAT SUGAR FREE 64)  30 mL Oral BID  . heparin injection (subcutaneous)  5,000 Units Subcutaneous Q8H  . lidocaine  1 patch Transdermal Q24H  . losartan  100 mg Oral QHS  . metoprolol succinate  100 mg Oral QHS  . mometasone-formoterol  2 puff Inhalation BID  . multivitamin  1 tablet Oral QHS  . pantoprazole  40 mg Oral Daily  . polyethylene glycol  17 g Oral Daily  . pregabalin  100 mg Oral QHS  . senna-docusate  1 tablet Oral BID  . sevelamer carbonate  2,400 mg  Oral TID WC  . traZODone  50 mg Oral QHS

## 2018-01-20 NOTE — Discharge Summary (Addendum)
Physician Discharge Summary  [name] LEX:[id number] DOB: [birth year] DOA: 01/15/2018  PCP: [place], Betty G, MD  Admit date: 01/15/2018 Discharge date: 01/21/2018    Addendum: 01/21/2018 no changes.  Patient is still medically stable for discharge to skilled nursing facility.  Addendum: 01/22/2018 no changes.  Patient is still medically stable for discharge to skilled nursing facility. She will get dialysis today prior to going to facility.   Admitted From: Home Disposition: Skilled nursing facility  Recommendations for Outpatient Follow-up:  1. Follow up with PCP in 1-2 weeks 2. Please obtain BMP/CBC in one week   Home Health: No Equipment/Devices: None  Discharge Condition: Stable CODE STATUS: DNR Diet recommendation: Heart Healthy  Brief/Interim Summary:  #) Recurrent falls: Patient was recently admitted for pneumonia and discharged home however came back in with recurrent falls that were mechanical.  Pelvic MRI showed evidence of muscle edema and muscle strain.  She was noted to have some possible right lower extremity weakness and pain however MRI of the spine showed only spinal stenosis.  Patient was evaluated by physical therapy who recommended skilled nursing facility placement.  #) Right knee pain: Patient was noted to have an MRI on 01/19/2018 a tiny nondisplaced fracture of the posterior rim as well as a small joint effusion.  Patient was seen by orthopedic surgery who recommended touchdown weightbearing and no casting at this time.  #) Acute encephalopathy/difficulty speaking: Patient was initially admitted with some possible altered mental status and changes in speech however head MRI was negative.  Patient had no evidence of infection.  Swallow eval was done that showed dysphagia 2 diet with thin liquids and medications that were crushed.  #) History of DVT on warfarin: Patient had multiple vascular ultrasounds that were negative.  Her warfarin was discontinued.  #)  Peripheral vascular disease status post stent placement: Patient was continued on clopidogrel.  #) ESRD: Patient was continued on hemodialysis Monday, Wednesday, Friday.  She was continued on phosphate binders and vitamin D/calcium supplements.  #) Hypertension/hyperlipidemia: Patient was continued on home atorvastatin, amlodipine, losartan, metoprolol.  #) COPD: Patient was continued on LABA/ICS, PRN short-acting bronchodilators  #) Chronic pain: Patient was continued on pregabalin.  Discharge Diagnoses:  Principal Problem:   Fracture of multiple pubic rami (HCC) Active Problems:   Back pain   Hypertension   ESRD on dialysis ([place])   PAD (peripheral artery disease) ([place])   Long term (current) use of anticoagulants   History of DVT of lower extremity   Closed fracture of multiple pubic rami, right, initial encounter [facility])   Fall    Discharge Instructions  Discharge Instructions    Call MD for:  difficulty breathing, headache or visual disturbances   Complete by:  As directed    Call MD for:  extreme fatigue   Complete by:  As directed    Call MD for:  hives   Complete by:  As directed    Call MD for:  persistant dizziness or light-headedness   Complete by:  As directed    Call MD for:  persistant nausea and vomiting   Complete by:  As directed    Call MD for:  redness, tenderness, or signs of infection (pain, swelling, redness, odor or green/yellow discharge around incision site)   Complete by:  As directed    Call MD for:  severe uncontrolled pain   Complete by:  As directed    Call MD for:  temperature >100.4   Complete by:  As  directed    Diet - low sodium heart healthy   Complete by:  As directed    Discharge instructions   Complete by:  As directed    Please follow-up with your PCP in 1 to 2 weeks.   Increase activity slowly   Complete by:  As directed      Allergies as of 01/21/2018      Reactions   Tuberculin Tests Hives   "blisters"   Valacyclovir Other  (See Comments)   Confusion and nervousness   Codeine Nausea And Vomiting   Penicillins Rash   No problems breathing. Has tolerated omnicef in past without issue Has patient had a PCN reaction causing immediate rash, facial/tongue/throat swelling, SOB or lightheadedness with hypotension: Yes Has patient had a PCN reaction causing severe rash involving mucus membranes or skin necrosis: No Has patient had a PCN reaction that required hospitalization No Has patient had a PCN reaction occurring within the last 10 years: No If all of the above answers are "NO", then may proceed with Cephalosporin   Sulfamethoxazole Rash      Medication List    STOP taking these medications   cefUROXime 500 MG tablet Commonly known as:  CEFTIN   predniSONE 20 MG tablet Commonly known as:  DELTASONE   warfarin 5 MG tablet Commonly known as:  COUMADIN     TAKE these medications   albuterol 108 (90 Base) MCG/ACT inhaler Commonly known as:  PROVENTIL HFA;VENTOLIN HFA Inhale 2 puffs into the lungs every 4 (four) hours as needed for wheezing or shortness of breath.   allopurinol 100 MG tablet Commonly known as:  ZYLOPRIM Take 100 mg by mouth once daily on non-dialysis days (Tues/Thurs/Sat/Sun) What changed:    how much to take  how to take this  when to take this  additional instructions   amLODipine 10 MG tablet Commonly known as:  NORVASC TAKE 1 TABLET BY MOUTH AT BEDTIME   atorvastatin 40 MG tablet Commonly known as:  LIPITOR Take 1 tablet (40 mg total) by mouth daily. What changed:  when to take this   azelastine 0.1 % nasal spray Commonly known as:  ASTELIN Place 1 spray into both nostrils 2 (two) times daily. Use in each nostril as directed What changed:    when to take this  reasons to take this   budesonide-formoterol 160-4.5 MCG/ACT inhaler Commonly known as:  SYMBICORT Inhale 2 puffs into the lungs 2 (two) times daily.   cinacalcet 90 MG tablet Commonly known as:   SENSIPAR Take 90 mg by mouth at bedtime.   clopidogrel 75 MG tablet Commonly known as:  PLAVIX Take 1 tablet (75 mg total) by mouth daily.   DIALYVITE/ZINC Tabs Take 1 tablet by mouth daily.   DULoxetine 30 MG capsule Commonly known as:  CYMBALTA Take 2 capsules (60 mg total) by mouth daily. What changed:  when to take this   fluticasone 50 MCG/ACT nasal spray Commonly known as:  FLONASE Place 2 sprays into both nostrils daily as needed for allergies or rhinitis.   glycopyrrolate 2 MG tablet Commonly known as:  ROBINUL Take 1 tablet (2 mg total) by mouth 2 (two) times daily.   hydrOXYzine 25 MG tablet Commonly known as:  ATARAX/VISTARIL Take 1 tablet (25 mg total) by mouth every 8 (eight) hours as needed for itching.   lidocaine-prilocaine cream Commonly known as:  EMLA Apply 1 application topically See admin instructions. Apply topically one hour before dialysis - Monday, Wednesday, Friday  loratadine 10 MG tablet Commonly known as:  CLARITIN 10 mg every other day. What changed:    how much to take  how to take this  when to take this  reasons to take this   losartan 100 MG tablet Commonly known as:  COZAAR Take 1 tablet (100 mg total) by mouth daily.   metoprolol succinate 100 MG 24 hr tablet Commonly known as:  TOPROL-XL TAKE 1 TABLET (100 MG)  BY MOUTH TWICE DAILY What changed:    how much to take  how to take this  when to take this  additional instructions   multivitamin Tabs tablet Take 1 tablet by mouth at bedtime. What changed:  when to take this   oxyCODONE-acetaminophen 5-325 MG tablet Commonly known as:  PERCOCET/ROXICET Take 1 tablet by mouth every 8 (eight) hours as needed for severe pain. What changed:  how much to take   polyethylene glycol powder powder Commonly known as:  GLYCOLAX/MIRALAX Take 17 g by mouth daily.   pregabalin 100 MG capsule Commonly known as:  LYRICA Take 100 mg by mouth at bedtime.   senna-docusate  8.6-50 MG tablet Commonly known as:  Senokot-S Take 1 tablet by mouth 2 (two) times daily.   sevelamer carbonate 800 MG tablet Commonly known as:  RENVELA Take 2,400 mg by mouth See admin instructions. Take 3 tablets (2400 mg) by mouth with each meal or snack - once or twice daily      Follow-up Information    Hiram Gash, MD. Schedule an appointment as soon as possible for a visit in 2 week(s).   Specialty:  Orthopedic Surgery Contact information: [address] [place] [address] [phone]          Allergies  Allergen Reactions  . Tuberculin Tests Hives    "blisters"  . Valacyclovir Other (See Comments)    Confusion and nervousness  . Codeine Nausea And Vomiting  . Penicillins Rash    No problems breathing. Has tolerated omnicef in past without issue Has patient had a PCN reaction causing immediate rash, facial/tongue/throat swelling, SOB or lightheadedness with hypotension: Yes Has patient had a PCN reaction causing severe rash involving mucus membranes or skin necrosis: No Has patient had a PCN reaction that required hospitalization No Has patient had a PCN reaction occurring within the last 10 years: No If all of the above answers are "NO", then may proceed with Cephalosporin  . Sulfamethoxazole Rash    Consultations:  Orthopedics  Nephrology  Palliative care   Procedures/Studies: Dg Chest 2 View  Result Date: 01/15/2018 CLINICAL DATA:  Fall leg pain EXAM: CHEST - 2 VIEW COMPARISON:  01/12/2018, 12/21/2017, 08/20/2017 FINDINGS: Left-sided central venous catheter tip projects over the right atrium. Right mid lung opacity has resolved. Chronic elevation of right diaphragm. Low lung volume. Mildly enlarged cardiomediastinal silhouette with aortic atherosclerosis. No pneumothorax. IMPRESSION: No active cardiopulmonary disease.  Cardiomegaly. Electronically Signed   By: Donavan Foil M.D.   On: 01/15/2018 18:36   Dg Thoracic Spine 2  View  Result Date: 01/16/2018 CLINICAL DATA:  Mid back pain. EXAM: THORACIC SPINE 2 VIEWS COMPARISON:  01/15/2018 and prior chest radiographs FINDINGS: No acute fracture or subluxation. Mild multilevel degenerative disc disease in the LOWER thoracic spine again noted. No focal bony lesions are present. A LEFT IJ central venous catheter is again noted. IMPRESSION: 1. No acute abnormality 2. Mild multilevel degenerative changes in the LOWER thoracic spine. Electronically Signed   By: Dellis Filbert  Hu M.D.   On: 01/16/2018 13:52   Dg Lumbar Spine 2-3 Views  Result Date: 01/16/2018 CLINICAL DATA:  Chronic low back pain. EXAM: LUMBAR SPINE - 2-3 VIEW COMPARISON:  08/20/2017 CT and prior studies FINDINGS: No acute fracture or subluxation noted. Mild to moderate multilevel degenerative disc disease, spondylosis and facet arthropathy noted. No focal bony lesions identified. Aortic atherosclerotic calcifications again noted. IMPRESSION: 1. No acute abnormality 2. Mild to moderate multilevel degenerative changes. 3.  Aortic Atherosclerosis (ICD10-I70.0). Electronically Signed   By: Margarette Canada M.D.   On: 01/16/2018 13:51   Dg Knee 2 Views Right  Result Date: 01/15/2018 CLINICAL DATA:  Fall with knee pain EXAM: RIGHT KNEE - 1-2 VIEW COMPARISON:  10/05/2003 FINDINGS: Stent in the mid to distal thigh. No fracture or malalignment. Small knee effusion. Mild degenerative change of the medial compartment. IMPRESSION: No acute osseous abnormality.  Small knee effusion. Electronically Signed   By: Donavan Foil M.D.   On: 01/15/2018 18:39   Ct Head Wo Contrast  Result Date: 01/15/2018 CLINICAL DATA:  77 y/o F; 2 falls today. Word-finding difficulty. Right knee pain. Missed dialysis today. EXAM: CT HEAD WITHOUT CONTRAST CT CERVICAL SPINE WITHOUT CONTRAST TECHNIQUE: Multidetector CT imaging of the head and cervical spine was performed following the standard protocol without intravenous contrast. Multiplanar CT image  reconstructions of the cervical spine were also generated. COMPARISON:  12/25/2017 CT head. FINDINGS: CT HEAD FINDINGS Brain: No evidence of acute infarction, hemorrhage, hydrocephalus, extra-axial collection or mass lesion/mass effect. Left anterior frontal stable encephalomalacia. Empty sella turcica. Vascular: Calcific atherosclerosis of carotid siphons. No hyperdense vessel. Skull: Left parietal scalp contusion. No calvarial fracture. Chronic postsurgical changes related to bifrontal craniotomy. Sinuses/Orbits: Left-sided ethmoidectomy and maxillary antrostomy postsurgical changes. Mild mucosal thickening of the paranasal sinuses. Normal aeration of the mastoid air cells. Bilateral intra-ocular lens replacement. Other: None. CT CERVICAL SPINE FINDINGS Alignment: C4-5 and C5-6 grade 1 anterolisthesis. Skull base and vertebrae: No acute fracture. No primary bone lesion or focal pathologic process. Soft tissues and spinal canal: No prevertebral fluid or swelling. No visible canal hematoma. 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 Cervical spondylosis with mild multilevel disc and moderate facet arthrosis. No high-grade bony canal stenosis. Upper chest: Left subclavian stent partially visualized. Calcific atherosclerosis of the carotid bifurcations and the aorta. Other: 10 mm nodule in the left lobe of the thyroid. IMPRESSION: 1. Left parietal scalp contusion. 2. No calvarial fracture or acute intracranial abnormality. 3. No acute fracture or dislocation of the cervical spine. 4. Stable left anterior frontal lobe encephalomalacia and empty sella turcica. 5. Mild paranasal sinus mucosal thickening. 6. 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 Electronically Signed   By: Kristine Garbe M.D.   On: 01/15/2018 19:08   Ct Head Wo Contrast  Result Date: 12/25/2017 CLINICAL DATA:  Diplopia EXAM: CT  HEAD WITHOUT CONTRAST TECHNIQUE: Contiguous axial images were obtained from the base of the skull through the vertex without intravenous contrast. COMPARISON:  MRI 05/30/2017, CT brain 11/14/2013 FINDINGS: Brain: No acute territorial infarction, hemorrhage or intracranial mass. Encephalomalacia in the left parasagittal frontal lobe. Mild atrophy. Stable ventricle size. Vascular: No hyperdense vessels.  Carotid vascular calcification Skull: Prior bifrontal craniotomy. Sinuses/Orbits: Postsurgical changes of the left maxillary and ethmoid sinuses. Mucosal thickening within the ethmoid and maxillary sinuses. No acute orbital abnormality is seen Other: None IMPRESSION: 1. No CT evidence for acute intracranial abnormality. 2.  Encephalomalacia in the left frontal lobe. Previous anterior craniotomy. Electronically Signed   By: Donavan Foil M.D.   On: 12/25/2017 19:38   Ct Cervical Spine Wo Contrast  Result Date: 01/15/2018 CLINICAL DATA:  77 y/o F; 2 falls today. Word-finding difficulty. Right knee pain. Missed dialysis today. EXAM: CT HEAD WITHOUT CONTRAST CT CERVICAL SPINE WITHOUT CONTRAST TECHNIQUE: Multidetector CT imaging of the head and cervical spine was performed following the standard protocol without intravenous contrast. Multiplanar CT image reconstructions of the cervical spine were also generated. COMPARISON:  12/25/2017 CT head. FINDINGS: CT HEAD FINDINGS Brain: No evidence of acute infarction, hemorrhage, hydrocephalus, extra-axial collection or mass lesion/mass effect. Left anterior frontal stable encephalomalacia. Empty sella turcica. Vascular: Calcific atherosclerosis of carotid siphons. No hyperdense vessel. Skull: Left parietal scalp contusion. No calvarial fracture. Chronic postsurgical changes related to bifrontal craniotomy. Sinuses/Orbits: Left-sided ethmoidectomy and maxillary antrostomy postsurgical changes. Mild mucosal thickening of the paranasal sinuses. Normal aeration of the mastoid air  cells. Bilateral intra-ocular lens replacement. Other: None. CT CERVICAL SPINE FINDINGS Alignment: C4-5 and C5-6 grade 1 anterolisthesis. Skull base and vertebrae: No acute fracture. No primary bone lesion or focal pathologic process. Soft tissues and spinal canal: No prevertebral fluid or swelling. No visible canal hematoma. 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 Cervical spondylosis with mild multilevel disc and moderate facet arthrosis. No high-grade bony canal stenosis. Upper chest: Left subclavian stent partially visualized. Calcific atherosclerosis of the carotid bifurcations and the aorta. Other: 10 mm nodule in the left lobe of the thyroid. IMPRESSION: 1. Left parietal scalp contusion. 2. No calvarial fracture or acute intracranial abnormality. 3. No acute fracture or dislocation of the cervical spine. 4. Stable left anterior frontal lobe encephalomalacia and empty sella turcica. 5. Mild paranasal sinus mucosal thickening. 6. 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 Electronically Signed   By: Kristine Garbe M.D.   On: 01/15/2018 19:08   Mr Brain Wo Contrast  Result Date: 01/15/2018 CLINICAL DATA:  Focal neuro deficit, > 6 hrs, stroke suspected. Word-finding difficulty and multiple falls EXAM: MRI HEAD WITHOUT CONTRAST TECHNIQUE: Multiplanar, multiecho pulse sequences of the brain and surrounding structures were obtained without intravenous contrast. COMPARISON:  Head CT 01/15/2018 FINDINGS: The examination had to be discontinued prior to completion due to patient inability to cooperate with the technologist's instructions. The patient was attempting to remove the head coil. There are 9 series provided. Axial T1-weighted imaging and coronal T2-weighted imaging was not obtained. BRAIN: There is no acute infarct, acute hemorrhage or mass effect. Partially empty sella. Left frontal  encephalomalacia is unchanged. 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 White matter signal is otherwise normal. The CSF spaces are normal for age, with no hydrocephalus. Susceptibility-sensitive sequences show no chronic microhemorrhage or superficial siderosis. VASCULAR: Major intracranial arterial and venous sinus flow voids are preserved. SKULL AND UPPER CERVICAL SPINE: Remote bifrontal craniotomy. Large left parietal scalp hematoma. SINUSES/ORBITS: No fluid levels or advanced mucosal thickening. No mastoid or middle ear effusion. There are bilateral lens replacements. IMPRESSION: 1. Truncated examination.  No acute infarct. 2. Left frontal encephalomalacia and multiple punctate, old cerebellar infarcts. 3. Large left parietal scalp hematoma. Electronically Signed   By: Ulyses Jarred M.D.   On: 01/15/2018 22:51   Mr Lumbar Spine Wo Contrast  Result Date: 01/17/2018 CLINICAL DATA:  Right lower extremity weakness. Multiple falls recently. EXAM: MRI LUMBAR SPINE WITHOUT CONTRAST TECHNIQUE: Multiplanar, multisequence MR imaging of the  lumbar spine was performed. No intravenous contrast was administered. COMPARISON:  MRI lumbar spine dated January 17, 2017. FINDINGS: Segmentation:  Standard. Alignment:  Unchanged trace anterolisthesis at L4-L5. Vertebrae: Unchanged heterogeneous marrow signal without focal lesion. No fracture or evidence of discitis. Scattered chronic degenerative endplate changes. Conus medullaris and cauda equina: Conus extends to the L2 level. Conus and cauda equina appear normal. No epidural hematoma. Paraspinal and other soft tissues: Unchanged bilateral renal cortical atrophy and multiple simple cysts. Unchanged T1 hyperintense, T2 iso- to hypointense lesion in the left kidney, likely a hemorrhagic or proteinaceous cyst. Colonic diverticulosis. Disc levels: T9-T10: Mild disc degeneration and facet arthropathy with mild right neuroforaminal stenosis. T10-T11: Mild disc degeneration and facet  arthropathy with mild right neuroforaminal stenosis. T11-T12: Mild disc degeneration and facet arthropathy with mild right neuroforaminal stenosis. T12-L1:  Negative. L1-L2:  Negative. L2-L3: Diffuse disc bulge, eccentric to the right. Mild bilateral facet arthropathy. Mild-to-moderate central spinal canal stenosis. No neuroforaminal stenosis. Findings are similar to prior study. L3-L4: Small disc bulge and moderate bilateral facet arthropathy with ligamentum flavum hypertrophy mild central spinal canal stenosis. Moderate left lateral recess stenosis. No neuroforaminal stenosis. Findings have progressed when compared to prior study. L4-L5: Diffuse disc bulge and moderate bilateral facet arthropathy with small facet joint effusions. Ligamentum flavum hypertrophy. Mild central spinal canal and bilateral recess stenosis. No neuroforaminal stenosis. Findings are similar to prior study. L5-S1: Normal disc. Severe bilateral facet arthropathy. Mild bilateral neuroforaminal stenosis. No spinal canal stenosis. Findings are similar to prior study. IMPRESSION: 1.  No acute abnormality. 2. Multilevel degenerative changes of the lumbar spine as described above, slightly progressed at L3-L4 where there is new mild central spinal canal stenosis and moderate left lateral recess stenosis. 3. Unchanged mild to moderate central spinal canal stenosis at L2-L3. Unchanged mild bilateral neuroforaminal stenosis at L5-S1. 4. Unchanged moderate to severe lumbar facet arthropathy. Electronically Signed   By: Titus Dubin M.D.   On: 01/17/2018 14:37   Mr Pelvis Wo Contrast  Result Date: 01/17/2018 CLINICAL DATA:  Two falls over the past few days. Possible inferior pubic ramus fracture on x-ray. EXAM: MRI PELVIS WITHOUT CONTRAST TECHNIQUE: Multiplanar multisequence MR imaging of the pelvis was performed. No intravenous contrast was administered. COMPARISON:  Bilateral hip x-rays dated January 15, 2018. CT abdomen pelvis dated August 20, 2017. FINDINGS: Bones: There is no evidence of acute fracture, dislocation or avascular necrosis. Heterogeneous marrow signal without focal lesion. Small bilateral sacroiliac joint effusions, likely degenerative. Mild degenerative changes of the pubic symphysis. Articular cartilage and labrum Articular cartilage: No focal chondral defect or subchondral signal abnormality identified. Labrum: There is no gross labral tear or paralabral abnormality. Joint or bursal effusion Joint effusion: No significant hip joint effusion. Bursae: No focal periarticular fluid collection. Muscles and tendons Muscles and tendons: The visualized sartorius, rectus femoris, gluteus, hamstring and iliopsoas tendons appear normal. Mild edema within the proximal right obturator externus and adductor brevis muscles adjacent to the pubic symphysis. Faint edema within the bilateral piriformis muscles. Mild bilateral gluteus muscle atrophy. Other findings Miscellaneous: Prior hysterectomy. Trace presacral fluid. Sigmoid diverticulosis. IMPRESSION: 1.  No acute osseous abnormality. 2. Mild edema within the proximal right obturator externus and adductor brevis muscles could reflect strain. These results were called by telephone at the time of interpretation on 01/17/2018 at 5:00 pm to Dr. Verlon Au , who verbally acknowledged these results. Electronically Signed   By: Titus Dubin M.D.   On: 01/17/2018 17:14  Mr Knee Right Wo Contrast  Result Date: 01/19/2018 CLINICAL DATA:  Right knee pain and swelling. Two falls in the last 24 hours. EXAM: MRI OF THE RIGHT KNEE WITHOUT CONTRAST TECHNIQUE: Multiplanar, multisequence MR imaging of the knee was performed. No intravenous contrast was administered. COMPARISON:  Radiographs dated 01/15/2018 FINDINGS: MENISCI Medial meniscus:  Intact. Lateral meniscus: Intact but diffusely intrinsically degenerated with some fraying of the superior surface of the anterior horn. LIGAMENTS Cruciates:  Normal.  Collaterals:  Normal. CARTILAGE Patellofemoral:  Normal. Medial:  Normal. Lateral: Tiny focal fissure in the posterior central aspect of the femoral condyle. Joint: Moderate joint effusion. Debris and synovial hypertrophy in the joint. Popliteal Fossa: Small Baker's cyst containing debris. Intact popliteus tendon. Extensor Mechanism:  Normal. Bones: Tiny nondisplaced subcortical fracture of the posterior margin of lateral tibial plateau best seen on images 7 and 8 of series 19. No other significant bone abnormality. Other: None IMPRESSION: 1. Tiny nondisplaced fracture of the posterior rim of the lateral tibial plateau. 2. Moderate joint effusion with some debris in the joint. 3. Small Baker's cyst containing debris. 4. Degenerative changes of the lateral meniscus as described. Electronically Signed   By: Lorriane Shire M.D.   On: 01/19/2018 11:47   Dg Chest Portable 1 View  Result Date: 01/12/2018 CLINICAL DATA:  Dialysis yesterday. Wheezing this afternoon. Chest discomfort. Bradycardia. Rales. EXAM: PORTABLE CHEST 1 VIEW COMPARISON:  12/21/2017 FINDINGS: Left central venous dialysis catheter with tip over the right atrium. No pneumothorax. Shallow inspiration with elevation of right hemidiaphragm. Right perihilar infiltrates are new since previous study and may indicate developing pneumonia. Calcification of the aorta. No blunting of costophrenic angles. IMPRESSION: New development of right perihilar infiltrates suggesting developing pneumonia. Electronically Signed   By: Lucienne Capers M.D.   On: 01/12/2018 04:16   Dg Abd 2 Views  Result Date: 12/24/2017 CLINICAL DATA:  Abdominal pain. EXAM: ABDOMEN - 2 VIEW COMPARISON:  CT abdomen and pelvis 08/20/2017 FINDINGS: No intraperitoneal free air is identified. There is stool throughout the colon, with a large amount of stool noted in the ascending colon and rectum. No dilated loops of bowel are seen to suggest obstruction. Atherosclerotic vascular  calcifications and pelvic surgical clips are noted. A HERO graft is noted in the chest. Bibasilar lung opacities are similar to a recent chest radiograph from 12/21/2017 and likely represent atelectasis. IMPRESSION: Large amount of colonic and rectal stool. No evidence of bowel obstruction. Electronically Signed   By: Logan Bores M.D.   On: 12/24/2017 10:01   Dg Hips Bilat W Or Wo Pelvis 3-4 Views  Result Date: 01/15/2018 CLINICAL DATA:  Fall with bilateral leg pain EXAM: DG HIP (WITH OR WITHOUT PELVIS) 3-4V BILAT COMPARISON:  CT 08/20/2017 FINDINGS: SI joints are non widened. The pubic symphysis is intact. Possible acute minimally displaced fracture involving the right inferior pubic ramus. Both femoral heads project in joint. Vascular calcifications.  Stent in the right thigh. IMPRESSION: Possible acute minimally displaced fracture involving the right inferior pubic ramus. Otherwise no acute osseous abnormality is seen. Electronically Signed   By: Donavan Foil M.D.   On: 01/15/2018 18:38      Subjective:   Discharge Exam: Vitals:   01/20/18 2008 01/21/18 0314  BP: (!) 142/47 (!) 122/41  Pulse: 74 80  Resp: 18 16  Temp: 97.9 F (36.6 C) 98.1 F (36.7 C)  SpO2: 100% 100%   Vitals:   01/20/18 1553 01/20/18 1659 01/20/18 2008 01/21/18 0314  BP: Marland Kitchen)  181/63  (!) 142/47 (!) 122/41  Pulse: 71  74 80  Resp: 16  18 16   Temp: (!) 97.5 F (36.4 C)  97.9 F (36.6 C) 98.1 F (36.7 C)  TempSrc: Oral  Oral Oral  SpO2: 96%  100% 100%  Weight:  63.6 kg    Height:        General: No acute distress. Cardiovascular:  Regular rate and rhythm, no murmurs. Lungs: Clear to auscultation bilaterally with good air movement Abdomen: Soft, nontender, nondistended  Neurological: Alert and oriented 3. Moves all extremities 4. Cranial nerves II through XII grossly intact. Skin: Warm and dry. No rashes or lesions. Extremities:  Mildly swollen right knee, tenderness to palpation, range of motion on  right limited, intact distal pulses, left upper extremity fistula with palpable thrill and bruit Psychiatric: Mood and affect are normal. Insight and judgment are appropriate    The results of significant diagnostics from this hospitalization (including imaging, microbiology, ancillary and laboratory) are listed below for reference.     Microbiology: Recent Results (from the past 240 hour(s))  Culture, sputum-assessment     Status: None   Collection Time: 01/12/18  9:40 PM  Result Value Ref Range Status   Specimen Description EXPECTORATED SPUTUM  Final   Special Requests NONE  Final   Sputum evaluation   Final    THIS SPECIMEN IS ACCEPTABLE FOR SPUTUM CULTURE Performed at [facility], [address] [place], [place] [address]    Report Status 01/13/2018 FINAL  Final  Culture, respiratory     Status: None   Collection Time: 01/12/18  9:40 PM  Result Value Ref Range Status   Specimen Description EXPECTORATED SPUTUM  Final   Special Requests NONE Reflexed from [id number]  Final   Gram Stain   Final    MODERATE WBC PRESENT,BOTH PMN AND MONONUCLEAR NO ORGANISMS SEEN Performed at [facility], [place] [address], [place], [place] [address]    Culture RARE STAPHYLOCOCCUS AUREUS  Final   Report Status 01/16/2018 FINAL  Final   Organism ID, Bacteria STAPHYLOCOCCUS AUREUS  Final      Susceptibility   Staphylococcus aureus - MIC*    CIPROFLOXACIN <=0.5 SENSITIVE Sensitive     ERYTHROMYCIN >=8 RESISTANT Resistant     GENTAMICIN <=0.5 SENSITIVE Sensitive     OXACILLIN <=0.25 SENSITIVE Sensitive     TETRACYCLINE <=1 SENSITIVE Sensitive     VANCOMYCIN <=0.5 SENSITIVE Sensitive     TRIMETH/SULFA <=10 SENSITIVE Sensitive     CLINDAMYCIN RESISTANT Resistant     RIFAMPIN <=0.5 SENSITIVE Sensitive     Inducible Clindamycin POSITIVE Resistant     * RARE STAPHYLOCOCCUS AUREUS     Labs: BNP (last 3 results) Recent Labs    07/23/17 1928  BNP 195.0*   Basic Metabolic  Panel: Recent Labs  Lab 01/17/18 0308 01/18/18 0503 01/19/18 0335 01/20/18 0306 01/21/18 0406  NA 139 140 141 138 140  K 4.4 4.1 3.9 4.2 3.9  CL 99 100 98 98 102  CO2 31 29 31 28  32  GLUCOSE 71 90 70 67* 92  BUN 10 21 8 15  5*  CREATININE 4.47* 7.11* 4.14* 5.83* 3.63*  CALCIUM 8.0* 7.7* 7.8* 7.6* 7.6*  MG 2.1 2.3 2.0 2.0 1.9  PHOS  --   --   --   --  2.1*   Liver Function Tests: Recent Labs  Lab 01/15/18 1708 01/16/18 0014 01/17/18 0308 01/18/18 0503 01/20/18 0306 01/21/18 0406  AST 21  32 28 22 22   --   ALT 15 15 17 15 14   --   ALKPHOS 105 74 82 84 72  --   BILITOT 0.4 1.4* 1.0 1.0 0.8  --   PROT 7.8 6.1* 5.8* 6.3* 6.0*  --   ALBUMIN 3.5 2.1* 2.0* 2.1* 2.1* 2.1*   No results for input(s): LIPASE, AMYLASE in the last 168 hours. No results for input(s): AMMONIA in the last 168 hours. CBC: Recent Labs  Lab 01/15/18 1708  01/17/18 0308 01/18/18 0503 01/19/18 0335 01/20/18 0306 01/21/18 0406  WBC 8.5   < > 12.8* 11.5* 12.6* 10.3 10.7*  NEUTROABS 4.5  --   --   --   --   --   --   HGB 11.0*   < > 10.9* 11.1* 10.4* 10.1* 9.5*  HCT 37.8   < > 35.3* 35.9* 33.6* 32.8* 30.7*  MCV 90.6   < > 89.1 88.6 88.7 88.9 89.2  PLT 286   < > 146* 192 215 206 185   < > = values in this interval not displayed.   Cardiac Enzymes: No results for input(s): CKTOTAL, CKMB, CKMBINDEX, TROPONINI in the last 168 hours. BNP: Invalid input(s): POCBNP CBG: Recent Labs  Lab 01/16/18 1056 01/16/18 1058 01/16/18 1114 01/16/18 1127  GLUCAP 39* 49* 75 130*   D-Dimer No results for input(s): DDIMER in the last 72 hours. Hgb A1c No results for input(s): HGBA1C in the last 72 hours. Lipid Profile No results for input(s): CHOL, HDL, LDLCALC, TRIG, CHOLHDL, LDLDIRECT in the last 72 hours. Thyroid function studies No results for input(s): TSH, T4TOTAL, T3FREE, THYROIDAB in the last 72 hours.  Invalid input(s): FREET3 Anemia work up No results for input(s): VITAMINB12, FOLATE, FERRITIN,  TIBC, IRON, RETICCTPCT in the last 72 hours. Urinalysis    Component Value Date/Time   COLORURINE YELLOW 12/30/2014 1419   APPEARANCEUR CLEAR 12/30/2014 1419   LABSPEC 1.009 12/30/2014 1419   PHURINE 7.5 12/30/2014 1419   GLUCOSEU NEGATIVE 12/30/2014 1419   HGBUR NEGATIVE 12/30/2014 1419   HGBUR negative 03/23/2007 1112   BILIRUBINUR NEGATIVE 12/30/2014 1419   KETONESUR NEGATIVE 12/30/2014 1419   PROTEINUR 100 (A) 12/30/2014 1419   UROBILINOGEN 0.2 12/30/2014 1419   NITRITE NEGATIVE 12/30/2014 1419   LEUKOCYTESUR NEGATIVE 12/30/2014 1419   Sepsis Labs Invalid input(s): PROCALCITONIN,  WBC,  LACTICIDVEN Microbiology Recent Results (from the past 240 hour(s))  Culture, sputum-assessment     Status: None   Collection Time: 01/12/18  9:40 PM  Result Value Ref Range Status   Specimen Description EXPECTORATED SPUTUM  Final   Special Requests NONE  Final   Sputum evaluation   Final    THIS SPECIMEN IS ACCEPTABLE FOR SPUTUM CULTURE Performed at [facility], [place] [address] [place], [place] [address]    Report Status 01/13/2018 FINAL  Final  Culture, respiratory     Status: None   Collection Time: 01/12/18  9:40 PM  Result Value Ref Range Status   Specimen Description EXPECTORATED SPUTUM  Final   Special Requests NONE Reflexed from [id number]  Final   Gram Stain   Final    MODERATE WBC PRESENT,BOTH PMN AND MONONUCLEAR NO ORGANISMS SEEN Performed at [facility], [place] [address] [place],  [address]    Culture RARE STAPHYLOCOCCUS AUREUS  Final   Report Status 01/16/2018 FINAL  Final   Organism ID, Bacteria STAPHYLOCOCCUS AUREUS  Final      Susceptibility   Staphylococcus aureus -  MIC*    CIPROFLOXACIN <=0.5 SENSITIVE Sensitive     ERYTHROMYCIN >=8 RESISTANT Resistant     GENTAMICIN <=0.5 SENSITIVE Sensitive     OXACILLIN <=0.25 SENSITIVE Sensitive     TETRACYCLINE <=1 SENSITIVE Sensitive     VANCOMYCIN <=0.5 SENSITIVE Sensitive     TRIMETH/SULFA <=10  SENSITIVE Sensitive     CLINDAMYCIN RESISTANT Resistant     RIFAMPIN <=0.5 SENSITIVE Sensitive     Inducible Clindamycin POSITIVE Resistant     * RARE STAPHYLOCOCCUS AUREUS     Time coordinating discharge: 35 minutes  SIGNED:   Cristy Folks, MD  [facility] 01/21/2018, 11:44 AM  If 7PM-7AM, please contact night-coverage [email or web address] Password [email or web address]

## 2018-01-21 ENCOUNTER — Other Ambulatory Visit (INDEPENDENT_AMBULATORY_CARE_PROVIDER_SITE_OTHER): Payer: Self-pay | Admitting: Nurse Practitioner

## 2018-01-21 LAB — RENAL FUNCTION PANEL
Albumin: 2.1 g/dL — ABNORMAL LOW (ref 3.5–5.0)
Anion gap: 6 (ref 5–15)
BUN: 5 mg/dL — ABNORMAL LOW (ref 8–23)
CO2: 32 mmol/L (ref 22–32)
Calcium: 7.6 mg/dL — ABNORMAL LOW (ref 8.9–10.3)
Chloride: 102 mmol/L (ref 98–111)
Creatinine, Ser: 3.63 mg/dL — ABNORMAL HIGH (ref 0.44–1.00)
GFR calc Af Amer: 13 mL/min — ABNORMAL LOW (ref >60)
GFR calc non Af Amer: 11 mL/min — ABNORMAL LOW (ref >60)
Glucose, Bld: 92 mg/dL (ref 70–99)
Phosphorus: 2.1 mg/dL — ABNORMAL LOW (ref 2.5–4.6)
Potassium: 3.9 mmol/L (ref 3.5–5.1)
Sodium: 140 mmol/L (ref 135–145)

## 2018-01-21 LAB — CBC
HCT: 30.7 % — ABNORMAL LOW (ref 36.0–46.0)
Hemoglobin: 9.5 g/dL — ABNORMAL LOW (ref 12.0–15.0)
MCH: 27.6 pg (ref 26.0–34.0)
MCHC: 30.9 g/dL (ref 30.0–36.0)
MCV: 89.2 fL (ref 78.0–100.0)
Platelets: 185 10*3/uL (ref 150–400)
RBC: 3.44 MIL/uL — ABNORMAL LOW (ref 3.87–5.11)
RDW: 17.4 % — ABNORMAL HIGH (ref 11.5–15.5)
WBC: 10.7 K/uL — ABNORMAL HIGH (ref 4.0–10.5)

## 2018-01-21 LAB — MAGNESIUM: Magnesium: 1.9 mg/dL (ref 1.7–2.4)

## 2018-01-21 MED ORDER — DEXTROSE 5 % IV SOLN: 900.0000 mg | Freq: Once | INTRAVENOUS | Status: DC

## 2018-01-21 NOTE — Plan of Care (Signed)
  Problem: Pain Managment: Goal: General experience of comfort will improve Outcome: Progressing   Problem: Safety: Goal: Ability to remain free from injury will improve Outcome: Progressing   

## 2018-01-21 NOTE — Consult Note (Signed)
   [facility] CM Inpatient Consult   01/21/2018  MARK BENECKE 1941/03/15 [id number]  Patient screened for readmission less than 7 days with Falls x 2 [place] [facility]. Patient had declined services on 01/13/18.  Current chart review reveals patient is listed for short term skilled nursing stay. For questions contact:   Natividad Brood, RN BSN [facility] Liaison  [phone] business mobile phone Toll free office [phone]

## 2018-01-21 NOTE — Progress Notes (Signed)
Physical Therapy Treatment Patient Details Name: [name] MRN: [id number] DOB: [birth year] Today's Date: 01/21/2018    History of Present Illness Pt is a 77 y.o. F with significant PMH of recent right lower extremity stent placement, history of DVT on Coumadin, CHF, chronic anemia who was recently admitted for pneumonia and discharged home who was brought to ER after 2 falls in last 24 hours.  Xray (01-15-18) shows possible R pelvic fracture, upon further inspection MRI (01/17/18) of pelvis revealed muscle strain and no evidence of fx.  Pt with c/o R knee pain with MRI (01-19-18) showing R lateral tibial plateau fx.  Pt is now TDWB per ortho PA.      PT Comments    Pt is progressing well.  She remains limited due to pain and requires max VCs for weight bearing status.  Plan for SNF remains appropriate at this time to improve strength and function before returning home.     Follow Up Recommendations  SNF;Supervision/Assistance - 24 hour     Equipment Recommendations  None recommended by PT    Recommendations for Other Services OT consult     Precautions / Restrictions Precautions Precautions: Fall Precaution Comments: improved R LE pain Restrictions Weight Bearing Restrictions: Yes RLE Weight Bearing: Touchdown weight bearing Other Position/Activity Restrictions: Pt with knew tib plateau fx, and now TDWB.      Mobility  Bed Mobility               General bed mobility comments: Pt sitting in recliner on arrival.    Transfers Overall transfer level: Needs assistance Equipment used: Rolling walker (2 wheeled) Transfers: Sit to/from Stand Sit to Stand: Min assist         General transfer comment: Cues for hand placement to and from seated surface.  Pt required cues for weight bearing as it appears she is putting more weight of her R foot than is instructed.    Ambulation/Gait Ambulation/Gait assistance: Min assist Gait Distance (Feet): 20 Feet Assistive device:  Rolling walker (2 wheeled) Gait Pattern/deviations: Step-to pattern;Decreased stride length;Decreased stance time - right;Narrow base of support Gait velocity: decreased   General Gait Details: Cues for sequencing.  Pt placing weight through R toe.  Pt required cues for B tricep contraction to reduce weight bearing on R side in stance phase.  Pt with difficulty following commands for sequencing.  Cues to shorten L stride length.     Stairs             Wheelchair Mobility    Modified Rankin (Stroke Patients Only)       Balance Overall balance assessment: Needs assistance Sitting-balance support: Bilateral upper extremity supported;Feet supported Sitting balance-Leahy Scale: Fair       Standing balance-Leahy Scale: Poor                              Cognition Arousal/Alertness: Awake/alert Behavior During Therapy: WFL for tasks assessed/performed Overall Cognitive Status: Within Functional Limits for tasks assessed                                        Exercises      General Comments        Pertinent Vitals/Pain Pain Assessment: 0-10 Pain Score: 6  Pain Location: R knee/ R ankle Pain Descriptors / Indicators: Grimacing;Guarding Pain Intervention(s): Monitored  during session;Repositioned    Home Living                      Prior Function            PT Goals (current goals can now be found in the care plan section) Acute Rehab PT Goals Patient Stated Goal: none stated. pt daughter wants to seek other options for post acute rehab Potential to Achieve Goals: Fair Progress towards PT goals: Progressing toward goals    Frequency    Min 3X/week      PT Plan Current plan remains appropriate    Co-evaluation              AM-PAC PT "6 Clicks" Daily Activity  Outcome Measure  Difficulty turning over in bed (including adjusting bedclothes, sheets and blankets)?: A Lot Difficulty moving from lying on back to  sitting on the side of the bed? : A Lot Difficulty sitting down on and standing up from a chair with arms (e.g., wheelchair, bedside commode, etc,.)?: Unable Help needed moving to and from a bed to chair (including a wheelchair)?: A Little Help needed walking in hospital room?: A Little Help needed climbing 3-5 steps with a railing? : Total 6 Click Score: 12    End of Session Equipment Utilized During Treatment: Gait belt Activity Tolerance: Patient tolerated treatment well Patient left: in chair;with call bell/phone within reach Nurse Communication: Mobility status PT Visit Diagnosis: Other abnormalities of gait and mobility (R26.89);Muscle weakness (generalized) (M62.81);Pain Pain - Right/Left: Right Pain - part of body: Leg     Time: 2103-1281 PT Time Calculation (min) (ACUTE ONLY): 14 min  Charges:  $Gait Training: 8-22 mins                     Governor Rooks, PTA pager [phone]    Cristela Blue 01/21/2018, 10:19 AM

## 2018-01-21 NOTE — Progress Notes (Addendum)
[facility] Progress Note   Dialysis Orders: [place] MWF 3h 67min  69.5kg   2/2.25 p2  LUA AVG  Hep none -Mircera 50 mcg IV q 2 weeks (last dose 01/06/18 Last HGB 11.4 01/06/18) -Hectorol 1 mcg IV TIW    Assessment/Plan: 1. R tib plateau fx/pelvic strain- fall at home. "Tiny" nondisplaced fracture of R tibial plateau by MRI.  No pelvic fx as first thought. WBAT per ortho, but she cannot stand up on or own and doesn't feel she can walk as Rx- for rehab -her first choice is [facility] 2. ESRD -MWF - HD today- K 4.2 - used 3 K bath Wed - K 3.9 today 3. Anemia - hgb 11.1> 10.1> 9.5  ESA resumed 8/21 4. Secondary hyperparathyroidism - on sensipar hectorol/renvela 5. HTN/volume - cont meds prone to high BP in outpatient setting, well under dry wt  6. Nutrition - alb 1.9  -intake very poor will drink nepro as meal replacement --encouraged -  7. Deconditioning - SNF placement for rehab 8. Anxiety/depression -current meds/reassurance 9. Hx possible  DVT -  12/23/17 -coumadin d/c'd per  primary conversation with [facility] - on plavix now 10. Ecephalopathy/falls - MRI neg for acute infarct , left frontal encephalomalacia/left large parietal scalp hematoma; back to baseline today. Need to minimize CNS affecting meds 11. Goals of Care - appreciate palliative care/chaplain assistance - now DNR; SNF for rehab  Myriam Jacobson, PA-C [facility] [phone] 01/21/2018,8:11 AM  LOS: 5 days   Pt seen, examined and agree w A/P as above.  Kelly Splinter MD [facility] pager [phone]   01/21/2018, 12:16 PM     Subjective: Slept during chair HD yesterday - doesn't recall any problems.  Pain in right groin this am - had some oxy IR 2.5 mg this am. Unable to do standing wt pre HD yesterday - was kept even - post wt 63.6 via bed scale.   Doesn't know d/c status. Objective Vitals:   01/20/18 1553 01/20/18 1659 01/20/18 2008 01/21/18 0314  BP: (!) 181/63  (!)  142/47 (!) 122/41  Pulse: 71  74 80  Resp: 16  18 16   Temp: (!) 97.5 F (36.4 C)  97.9 F (36.6 C) 98.1 F (36.7 C)  TempSrc: Oral  Oral Oral  SpO2: 96%  100% 100%  Weight:  63.6 kg    Height:       Physical Exam General: drowsy Heart: RRR Lungs grossly clear Abdomen: soft NT Extremities: no LE edema Dialysis Access: left HERO graft + bruit   Additional Objective Labs: Basic Metabolic Panel: Recent Labs  Lab 01/19/18 0335 01/20/18 0306 01/21/18 0406  NA 141 138 140  K 3.9 4.2 3.9  CL 98 98 102  CO2 31 28 32  GLUCOSE 70 67* 92  BUN 8 15 5*  CREATININE 4.14* 5.83* 3.63*  CALCIUM 7.8* 7.6* 7.6*  PHOS  --   --  2.1*   Liver Function Tests: Recent Labs  Lab 01/17/18 0308 01/18/18 0503 01/20/18 0306 01/21/18 0406  AST 28 22 22   --   ALT 17 15 14   --   ALKPHOS 82 84 72  --   BILITOT 1.0 1.0 0.8  --   PROT 5.8* 6.3* 6.0*  --   ALBUMIN 2.0* 2.1* 2.1* 2.1*   No results for input(s): LIPASE, AMYLASE in the last 168 hours. CBC: Recent Labs  Lab 01/15/18 1708  01/17/18 0308 01/18/18 0503 01/19/18 0335 01/20/18 0306 01/21/18  0406  WBC 8.5   < > 12.8* 11.5* 12.6* 10.3 10.7*  NEUTROABS 4.5  --   --   --   --   --   --   HGB 11.0*   < > 10.9* 11.1* 10.4* 10.1* 9.5*  HCT 37.8   < > 35.3* 35.9* 33.6* 32.8* 30.7*  MCV 90.6   < > 89.1 88.6 88.7 88.9 89.2  PLT 286   < > 146* 192 215 206 185   < > = values in this interval not displayed.   Blood Culture    Component Value Date/Time   SDES EXPECTORATED SPUTUM 01/12/2018 2140   SDES EXPECTORATED SPUTUM 01/12/2018 2140   SPECREQUEST NONE 01/12/2018 2140   SPECREQUEST NONE Reflexed from [id number] 01/12/2018 2140   CULT RARE STAPHYLOCOCCUS AUREUS 01/12/2018 2140   REPTSTATUS 01/13/2018 FINAL 01/12/2018 2140   REPTSTATUS 01/16/2018 FINAL 01/12/2018 2140    Cardiac Enzymes: No results for input(s): CKTOTAL, CKMB, CKMBINDEX, TROPONINI in the last 168 hours. CBG: Recent Labs  Lab 01/16/18 1056 01/16/18 1058  01/16/18 1114 01/16/18 1127  GLUCAP 39* 49* 75 130*   Iron Studies: No results for input(s): IRON, TIBC, TRANSFERRIN, FERRITIN in the last 72 hours. Lab Results  Component Value Date   INR 1.54 01/18/2018   INR 1.91 01/17/2018   INR 2.36 01/16/2018   Studies/Results: Mr Knee Right Wo Contrast  Result Date: 01/19/2018 CLINICAL DATA:  Right knee pain and swelling. Two falls in the last 24 hours. EXAM: MRI OF THE RIGHT KNEE WITHOUT CONTRAST TECHNIQUE: Multiplanar, multisequence MR imaging of the knee was performed. No intravenous contrast was administered. COMPARISON:  Radiographs dated 01/15/2018 FINDINGS: MENISCI Medial meniscus:  Intact. Lateral meniscus: Intact but diffusely intrinsically degenerated with some fraying of the superior surface of the anterior horn. LIGAMENTS Cruciates:  Normal. Collaterals:  Normal. CARTILAGE Patellofemoral:  Normal. Medial:  Normal. Lateral: Tiny focal fissure in the posterior central aspect of the femoral condyle. Joint: Moderate joint effusion. Debris and synovial hypertrophy in the joint. Popliteal Fossa: Small Baker's cyst containing debris. Intact popliteus tendon. Extensor Mechanism:  Normal. Bones: Tiny nondisplaced subcortical fracture of the posterior margin of lateral tibial plateau best seen on images 7 and 8 of series 19. No other significant bone abnormality. Other: None IMPRESSION: 1. Tiny nondisplaced fracture of the posterior rim of the lateral tibial plateau. 2. Moderate joint effusion with some debris in the joint. 3. Small Baker's cyst containing debris. 4. Degenerative changes of the lateral meniscus as described. Electronically Signed   By: Lorriane Shire M.D.   On: 01/19/2018 11:47   Medications:  . acetaminophen  1,000 mg Oral Q8H  . allopurinol  100 mg Oral Q T,Th,S,Su  . amLODipine  5 mg Oral QHS  . atorvastatin  40 mg Oral QHS  . Chlorhexidine Gluconate Cloth  6 each Topical Q0600  . Chlorhexidine Gluconate Cloth  6 each Topical  Q0600  . cinacalcet  90 mg Oral QHS  . clopidogrel  75 mg Oral Daily  . darbepoetin (ARANESP) injection - DIALYSIS  40 mcg Intravenous Q Wed-HD  . diclofenac sodium  2 g Topical QID  . doxercalciferol  1 mcg Intravenous Q M,W,F-HD  . DULoxetine  60 mg Oral QHS  . feeding supplement (NEPRO CARB STEADY)  237 mL Oral TID WC  . feeding supplement (PRO-STAT SUGAR FREE 64)  30 mL Oral BID  . heparin injection (subcutaneous)  5,000 Units Subcutaneous Q8H  . lidocaine  1 patch Transdermal Q24H  .  losartan  100 mg Oral QHS  . metoprolol succinate  100 mg Oral QHS  . mometasone-formoterol  2 puff Inhalation BID  . multivitamin  1 tablet Oral QHS  . pantoprazole  40 mg Oral BID  . polyethylene glycol  17 g Oral Daily  . pregabalin  100 mg Oral QHS  . senna-docusate  1 tablet Oral BID  . sevelamer carbonate  2,400 mg Oral TID WC  . traZODone  50 mg Oral QHS

## 2018-01-21 NOTE — Care Management (Signed)
Pt to discharge to SNF facilitated by CSW.  CM signing off.

## 2018-01-21 NOTE — Progress Notes (Signed)
Completed Advanced Directive with witnesses for patient.  Copy placed on chart and original given to patient.    01/21/18 1159  Clinical Encounter Type  Visited With Patient;Health care provider  Visit Type Follow-up;Spiritual support  Spiritual Encounters  Spiritual Needs Literature (Advanced Directive)  Advance Directives (For Healthcare)  Does Patient Have a Medical Advance Directive? Yes  Type of Paramedic of [place];Living will (Completed 01/21/2018 placed on chart)

## 2018-01-22 DIAGNOSIS — R296 Repeated falls: Secondary | ICD-10-CM | POA: Diagnosis not present

## 2018-01-22 DIAGNOSIS — S99911A Unspecified injury of right ankle, initial encounter: Secondary | ICD-10-CM | POA: Diagnosis present

## 2018-01-22 DIAGNOSIS — K219 Gastro-esophageal reflux disease without esophagitis: Secondary | ICD-10-CM | POA: Diagnosis not present

## 2018-01-22 DIAGNOSIS — I120 Hypertensive chronic kidney disease with stage 5 chronic kidney disease or end stage renal disease: Secondary | ICD-10-CM | POA: Diagnosis not present

## 2018-01-22 DIAGNOSIS — R0789 Other chest pain: Secondary | ICD-10-CM | POA: Diagnosis not present

## 2018-01-22 DIAGNOSIS — J189 Pneumonia, unspecified organism: Secondary | ICD-10-CM | POA: Diagnosis not present

## 2018-01-22 DIAGNOSIS — E041 Nontoxic single thyroid nodule: Secondary | ICD-10-CM | POA: Diagnosis not present

## 2018-01-22 DIAGNOSIS — N186 End stage renal disease: Secondary | ICD-10-CM | POA: Diagnosis not present

## 2018-01-22 DIAGNOSIS — S90222D Contusion of left lesser toe(s) with damage to nail, subsequent encounter: Secondary | ICD-10-CM | POA: Diagnosis not present

## 2018-01-22 DIAGNOSIS — R5383 Other fatigue: Secondary | ICD-10-CM | POA: Diagnosis not present

## 2018-01-22 DIAGNOSIS — Z87891 Personal history of nicotine dependence: Secondary | ICD-10-CM | POA: Diagnosis not present

## 2018-01-22 DIAGNOSIS — R488 Other symbolic dysfunctions: Secondary | ICD-10-CM | POA: Diagnosis not present

## 2018-01-22 DIAGNOSIS — S32591S Other specified fracture of right pubis, sequela: Secondary | ICD-10-CM | POA: Diagnosis not present

## 2018-01-22 DIAGNOSIS — I7090 Unspecified atherosclerosis: Secondary | ICD-10-CM | POA: Diagnosis not present

## 2018-01-22 DIAGNOSIS — S8291XA Unspecified fracture of right lower leg, initial encounter for closed fracture: Secondary | ICD-10-CM | POA: Diagnosis not present

## 2018-01-22 DIAGNOSIS — R278 Other lack of coordination: Secondary | ICD-10-CM | POA: Diagnosis not present

## 2018-01-22 DIAGNOSIS — R569 Unspecified convulsions: Secondary | ICD-10-CM | POA: Diagnosis not present

## 2018-01-22 DIAGNOSIS — Z881 Allergy status to other antibiotic agents status: Secondary | ICD-10-CM | POA: Diagnosis not present

## 2018-01-22 DIAGNOSIS — D631 Anemia in chronic kidney disease: Secondary | ICD-10-CM | POA: Diagnosis not present

## 2018-01-22 DIAGNOSIS — S80919A Unspecified superficial injury of unspecified knee, initial encounter: Secondary | ICD-10-CM | POA: Diagnosis not present

## 2018-01-22 DIAGNOSIS — R5381 Other malaise: Secondary | ICD-10-CM | POA: Diagnosis not present

## 2018-01-22 DIAGNOSIS — R0981 Nasal congestion: Secondary | ICD-10-CM | POA: Diagnosis not present

## 2018-01-22 DIAGNOSIS — S3289XA Fracture of other parts of pelvis, initial encounter for closed fracture: Secondary | ICD-10-CM | POA: Diagnosis not present

## 2018-01-22 DIAGNOSIS — R0902 Hypoxemia: Secondary | ICD-10-CM | POA: Diagnosis not present

## 2018-01-22 DIAGNOSIS — S82432A Displaced oblique fracture of shaft of left fibula, initial encounter for closed fracture: Secondary | ICD-10-CM | POA: Diagnosis not present

## 2018-01-22 DIAGNOSIS — G4700 Insomnia, unspecified: Secondary | ICD-10-CM | POA: Diagnosis not present

## 2018-01-22 DIAGNOSIS — M6281 Muscle weakness (generalized): Secondary | ICD-10-CM | POA: Diagnosis not present

## 2018-01-22 DIAGNOSIS — R1084 Generalized abdominal pain: Secondary | ICD-10-CM | POA: Diagnosis not present

## 2018-01-22 DIAGNOSIS — T82868A Thrombosis of vascular prosthetic devices, implants and grafts, initial encounter: Secondary | ICD-10-CM | POA: Diagnosis not present

## 2018-01-22 DIAGNOSIS — I2510 Atherosclerotic heart disease of native coronary artery without angina pectoris: Secondary | ICD-10-CM | POA: Diagnosis not present

## 2018-01-22 DIAGNOSIS — K5900 Constipation, unspecified: Secondary | ICD-10-CM | POA: Diagnosis not present

## 2018-01-22 DIAGNOSIS — J449 Chronic obstructive pulmonary disease, unspecified: Secondary | ICD-10-CM | POA: Diagnosis not present

## 2018-01-22 DIAGNOSIS — E7800 Pure hypercholesterolemia, unspecified: Secondary | ICD-10-CM | POA: Diagnosis not present

## 2018-01-22 DIAGNOSIS — E079 Disorder of thyroid, unspecified: Secondary | ICD-10-CM | POA: Diagnosis not present

## 2018-01-22 DIAGNOSIS — Z885 Allergy status to narcotic agent status: Secondary | ICD-10-CM | POA: Diagnosis not present

## 2018-01-22 DIAGNOSIS — R2689 Other abnormalities of gait and mobility: Secondary | ICD-10-CM | POA: Diagnosis not present

## 2018-01-22 DIAGNOSIS — D689 Coagulation defect, unspecified: Secondary | ICD-10-CM | POA: Diagnosis not present

## 2018-01-22 DIAGNOSIS — J9811 Atelectasis: Secondary | ICD-10-CM | POA: Diagnosis not present

## 2018-01-22 DIAGNOSIS — N2581 Secondary hyperparathyroidism of renal origin: Secondary | ICD-10-CM | POA: Diagnosis not present

## 2018-01-22 DIAGNOSIS — S82891D Other fracture of right lower leg, subsequent encounter for closed fracture with routine healing: Secondary | ICD-10-CM | POA: Diagnosis not present

## 2018-01-22 DIAGNOSIS — Z9049 Acquired absence of other specified parts of digestive tract: Secondary | ICD-10-CM | POA: Diagnosis not present

## 2018-01-22 DIAGNOSIS — Z79899 Other long term (current) drug therapy: Secondary | ICD-10-CM | POA: Diagnosis not present

## 2018-01-22 DIAGNOSIS — S82431A Displaced oblique fracture of shaft of right fibula, initial encounter for closed fracture: Secondary | ICD-10-CM | POA: Diagnosis not present

## 2018-01-22 DIAGNOSIS — R1312 Dysphagia, oropharyngeal phase: Secondary | ICD-10-CM | POA: Diagnosis not present

## 2018-01-22 DIAGNOSIS — I509 Heart failure, unspecified: Secondary | ICD-10-CM | POA: Diagnosis not present

## 2018-01-22 DIAGNOSIS — S32599D Other specified fracture of unspecified pubis, subsequent encounter for fracture with routine healing: Secondary | ICD-10-CM | POA: Diagnosis not present

## 2018-01-22 DIAGNOSIS — M25571 Pain in right ankle and joints of right foot: Secondary | ICD-10-CM | POA: Diagnosis not present

## 2018-01-22 DIAGNOSIS — R197 Diarrhea, unspecified: Secondary | ICD-10-CM | POA: Diagnosis not present

## 2018-01-22 DIAGNOSIS — Y9389 Activity, other specified: Secondary | ICD-10-CM | POA: Diagnosis not present

## 2018-01-22 DIAGNOSIS — W010XXA Fall on same level from slipping, tripping and stumbling without subsequent striking against object, initial encounter: Secondary | ICD-10-CM | POA: Diagnosis not present

## 2018-01-22 DIAGNOSIS — M2550 Pain in unspecified joint: Secondary | ICD-10-CM | POA: Diagnosis not present

## 2018-01-22 DIAGNOSIS — I739 Peripheral vascular disease, unspecified: Secondary | ICD-10-CM | POA: Diagnosis not present

## 2018-01-22 DIAGNOSIS — I959 Hypotension, unspecified: Secondary | ICD-10-CM | POA: Diagnosis not present

## 2018-01-22 DIAGNOSIS — Z992 Dependence on renal dialysis: Secondary | ICD-10-CM | POA: Diagnosis not present

## 2018-01-22 DIAGNOSIS — W19XXXA Unspecified fall, initial encounter: Secondary | ICD-10-CM | POA: Diagnosis not present

## 2018-01-22 DIAGNOSIS — R1032 Left lower quadrant pain: Secondary | ICD-10-CM | POA: Diagnosis not present

## 2018-01-22 DIAGNOSIS — F329 Major depressive disorder, single episode, unspecified: Secondary | ICD-10-CM | POA: Diagnosis not present

## 2018-01-22 DIAGNOSIS — E162 Hypoglycemia, unspecified: Secondary | ICD-10-CM | POA: Diagnosis not present

## 2018-01-22 DIAGNOSIS — I5022 Chronic systolic (congestive) heart failure: Secondary | ICD-10-CM | POA: Diagnosis not present

## 2018-01-22 DIAGNOSIS — Z8673 Personal history of transient ischemic attack (TIA), and cerebral infarction without residual deficits: Secondary | ICD-10-CM | POA: Diagnosis not present

## 2018-01-22 DIAGNOSIS — I10 Essential (primary) hypertension: Secondary | ICD-10-CM | POA: Diagnosis not present

## 2018-01-22 DIAGNOSIS — M25561 Pain in right knee: Secondary | ICD-10-CM | POA: Diagnosis not present

## 2018-01-22 DIAGNOSIS — Z8719 Personal history of other diseases of the digestive system: Secondary | ICD-10-CM | POA: Diagnosis not present

## 2018-01-22 DIAGNOSIS — Y929 Unspecified place or not applicable: Secondary | ICD-10-CM | POA: Diagnosis not present

## 2018-01-22 DIAGNOSIS — R29898 Other symptoms and signs involving the musculoskeletal system: Secondary | ICD-10-CM | POA: Diagnosis not present

## 2018-01-22 DIAGNOSIS — R0781 Pleurodynia: Secondary | ICD-10-CM | POA: Diagnosis not present

## 2018-01-22 DIAGNOSIS — R52 Pain, unspecified: Secondary | ICD-10-CM | POA: Diagnosis not present

## 2018-01-22 DIAGNOSIS — K589 Irritable bowel syndrome without diarrhea: Secondary | ICD-10-CM | POA: Diagnosis not present

## 2018-01-22 DIAGNOSIS — Z7189 Other specified counseling: Secondary | ICD-10-CM | POA: Diagnosis not present

## 2018-01-22 DIAGNOSIS — R079 Chest pain, unspecified: Secondary | ICD-10-CM | POA: Diagnosis not present

## 2018-01-22 DIAGNOSIS — Z7401 Bed confinement status: Secondary | ICD-10-CM | POA: Diagnosis not present

## 2018-01-22 DIAGNOSIS — Y832 Surgical operation with anastomosis, bypass or graft as the cause of abnormal reaction of the patient, or of later complication, without mention of misadventure at the time of the procedure: Secondary | ICD-10-CM | POA: Diagnosis not present

## 2018-01-22 DIAGNOSIS — I82409 Acute embolism and thrombosis of unspecified deep veins of unspecified lower extremity: Secondary | ICD-10-CM | POA: Diagnosis not present

## 2018-01-22 DIAGNOSIS — Y998 Other external cause status: Secondary | ICD-10-CM | POA: Diagnosis not present

## 2018-01-22 DIAGNOSIS — I770 Arteriovenous fistula, acquired: Secondary | ICD-10-CM | POA: Diagnosis not present

## 2018-01-22 DIAGNOSIS — R072 Precordial pain: Secondary | ICD-10-CM | POA: Diagnosis not present

## 2018-01-22 DIAGNOSIS — D649 Anemia, unspecified: Secondary | ICD-10-CM | POA: Diagnosis not present

## 2018-01-22 DIAGNOSIS — Z880 Allergy status to penicillin: Secondary | ICD-10-CM | POA: Diagnosis not present

## 2018-01-22 DIAGNOSIS — Z882 Allergy status to sulfonamides status: Secondary | ICD-10-CM | POA: Diagnosis not present

## 2018-01-22 DIAGNOSIS — S32591D Other specified fracture of right pubis, subsequent encounter for fracture with routine healing: Secondary | ICD-10-CM | POA: Diagnosis not present

## 2018-01-22 DIAGNOSIS — G2581 Restless legs syndrome: Secondary | ICD-10-CM | POA: Diagnosis not present

## 2018-01-22 DIAGNOSIS — E1129 Type 2 diabetes mellitus with other diabetic kidney complication: Secondary | ICD-10-CM | POA: Diagnosis not present

## 2018-01-22 DIAGNOSIS — I132 Hypertensive heart and chronic kidney disease with heart failure and with stage 5 chronic kidney disease, or end stage renal disease: Secondary | ICD-10-CM | POA: Diagnosis not present

## 2018-01-22 LAB — CBC
HEMATOCRIT: 36 % (ref 36.0–46.0)
HEMOGLOBIN: 11 g/dL — AB (ref 12.0–15.0)
MCH: 27.6 pg (ref 26.0–34.0)
MCHC: 30.6 g/dL (ref 30.0–36.0)
MCV: 90.5 fL (ref 78.0–100.0)
Platelets: 212 10*3/uL (ref 150–400)
RBC: 3.98 MIL/uL (ref 3.87–5.11)
RDW: 17.9 % — AB (ref 11.5–15.5)
WBC: 7.2 10*3/uL (ref 4.0–10.5)

## 2018-01-22 LAB — RENAL FUNCTION PANEL
ALBUMIN: 2.3 g/dL — AB (ref 3.5–5.0)
ANION GAP: 12 (ref 5–15)
BUN: 15 mg/dL (ref 8–23)
CALCIUM: 8.1 mg/dL — AB (ref 8.9–10.3)
CO2: 27 mmol/L (ref 22–32)
Chloride: 101 mmol/L (ref 98–111)
Creatinine, Ser: 6.39 mg/dL — ABNORMAL HIGH (ref 0.44–1.00)
GFR calc Af Amer: 7 mL/min — ABNORMAL LOW (ref >60)
GFR, EST NON AFRICAN AMERICAN: 6 mL/min — AB (ref >60)
GLUCOSE: 84 mg/dL (ref 70–99)
PHOSPHORUS: 3.2 mg/dL (ref 2.5–4.6)
POTASSIUM: 4.2 mmol/L (ref 3.5–5.1)
SODIUM: 140 mmol/L (ref 135–145)

## 2018-01-22 MED ORDER — CHLORHEXIDINE GLUCONATE CLOTH 2 % EX PADS: 6.0000 | MEDICATED_PAD | Freq: Every day | CUTANEOUS | Status: DC

## 2018-01-22 MED ORDER — DOXERCALCIFEROL 4 MCG/2ML IV SOLN
INTRAVENOUS | Status: AC
  Filled 2018-01-22: qty 2

## 2018-01-22 NOTE — Progress Notes (Signed)
RN paged MD [place] because pt DNR paper can not be found that was signed this AM. Awaiting call back.

## 2018-01-22 NOTE — Clinical Social Work Note (Signed)
Clinical Social Worker facilitated patient discharge including contacting patient family and facility to confirm patient discharge plans.  Clinical information faxed to facility and family agreeable with plan.  CSW arranged ambulance transport via [facility] to [facility] (Transport set for 6:00 to allow pt to complete dialysis).  RN to call for report prior to discharge.  Clinical Social Worker will sign off for now as social work intervention is no longer needed. Please consult [place] again if new need arises.  Loletha Grayer, MSW [phone]

## 2018-01-22 NOTE — Progress Notes (Signed)
Prentiss Bells, RN called [facility] and gave report to [place], Therapist, sports. Patient in dialysis when she returns RN will prep her for discharge. [facility] set for 6:00

## 2018-01-22 NOTE — Clinical Social Work Placement (Signed)
   CLINICAL SOCIAL WORK PLACEMENT  NOTE  Date:  01/22/2018  Patient Details  Name: [name] MRN: [id number] Date of Birth: 29-Jan-1941  Clinical Social Work is seeking post-discharge placement for this patient at the [place] level of care (*CSW will initial, date and re-position this form in  chart as items are completed):      Patient/family provided with [place] Work Department's list of facilities offering this level of care within the geographic area requested by the patient (or if unable, by the patient's family).  Yes   Patient/family informed of their freedom to choose among providers that offer the needed level of care, that participate in Medicare, Medicaid or managed care program needed by the patient, have an available bed and are willing to accept the patient.      Patient/family informed of [place]'s ownership interest in [facility] and [facility], as well as of the fact that they are under no obligation to receive care at these facilities.  PASRR submitted to EDS on       PASRR number received on 01/17/18     Existing PASRR number confirmed on       FL2 transmitted to all facilities in geographic area requested by pt/family on 01/17/18     FL2 transmitted to all facilities within larger geographic area on       Patient informed that his/her managed care company has contracts with or will negotiate with certain facilities, including the following:        Yes   Patient/family informed of bed offers received.  Patient chooses bed at [facility]     Physician recommends and patient chooses bed at      Patient to be transferred to [facility] on 01/22/18.  Patient to be transferred to facility by [facility]     Patient family notified on 01/22/18 of transfer.  Name of family member notified:  Darlene     PHYSICIAN       Additional Comment:    _______________________________________________ Eileen Stanford,  LCSW 01/22/2018, 12:28 PM

## 2018-01-22 NOTE — Progress Notes (Signed)
PT asleep and asked RN to come back later with her medications.

## 2018-01-22 NOTE — Progress Notes (Signed)
RN called SW number to see if she had the DNR paper no answer awaiting call back from MD

## 2018-01-22 NOTE — Progress Notes (Signed)
Patient has left via [facility] to [facility]. She received her evening medications scheduled for 2200.  Patient is alert and oriented x 4.  She seems anxious since her son left this evening around 2100.  Staff was able to reassure her that she is in safe hands and her daughter will meet her at [facility] when she gets there tonight.

## 2018-01-22 NOTE — Progress Notes (Signed)
SLP Cancellation Note  Patient Details Name: [name] MRN: [id number] DOB: [birth year]   Cancelled treatment:       Reason Eval/Treat Not Completed: Patient at procedure or test/unavailable, pt off unit for dialysis and is scheduled for transfer to SNF this afternoon.    [name], Selinda Orion 01/22/2018, 2:35 PM

## 2018-01-22 NOTE — Clinical Social Work Note (Signed)
Pt will d/c to [facility] following dialysis. Pt and pt's daughter aware. Facility prepared.   Loletha Grayer, MSW [phone]

## 2018-01-22 NOTE — Progress Notes (Addendum)
[facility] Progress Note   Subjective:  Didn't get D/C' yesterday - SNF placement pending  No complaints. For HD today.  Says there was some trouble cannulating fistula on Wed.   Objective Vitals:   01/21/18 2024 01/21/18 2044 01/22/18 0402 01/22/18 0850  BP:  (!) 136/54 (!) 155/53   Pulse: 80 79 74   Resp: 16 16 14    Temp:  98 F (36.7 C) 98.4 F (36.9 C)   TempSrc:  Oral Oral   SpO2: 98% 100% 99% 94%  Weight:      Height:       Physical Exam General: WNWD elderly female NAD  Heart: RRR Lungs: CTAB  Abdomen: soft NT Extremities: no LE edema  Dialysis Access: LUE HeRO AVG +bruit   Dialysis Orders: [facility] MWF 3h 3min  69.5kg   2/2.25 p2  LUA AVG  Hep none -Mircera 50 mcg IV q 2 weeks (last dose 01/06/18 Last HGB 11.4 01/06/18) -Hectorol 1 mcg IV TIW   Assessment/Plan: 1. R tib plateau fx/pelvic strain- fall at home. "Tiny" nondisplaced fracture of R tibial plateau by MRI.  No pelvic fx as first thought. WBAT per ortho, but she cannot stand up on or own and doesn't feel she can walk as Rx- for rehab -her first choice is [facility] 2. ESRD -MWF - HD today-  3. Anemia - hgb 11.1> 10.1> 9.5  ESA resumed 8/21 4. Secondary hyperparathyroidism - on sensipar hectorol/renvela 5. HTN/volume - cont meds prone to high BP in outpatient setting, well under dry wt  6. Nutrition - alb 2.1  -intake very poor will drink nepro as meal replacement --encouraged -  7. Deconditioning - SNF placement for rehab 8. Anxiety/depression -current meds/reassurance 9. Hx possible  DVT -  12/23/17 -coumadin d/c'd per  primary conversation with [facility] - on plavix now 10. Ecephalopathy/falls - MRI neg for acute infarct , left frontal encephalomalacia/left large parietal scalp hematoma; back to baseline today. Need to minimize CNS affecting meds 11. Goals of Care - appreciate palliative care/chaplain assistance - now DNR; SNF for rehab  Lynnda Child PA-C [place] Pager [phone] 01/22/2018,11:19 AM  LOS: 6 days   Pt seen, examined and agree w A/P as above.  Kelly Splinter MD [facility] pager [phone]   01/22/2018, 12:05 PM    Additional Objective Labs: Basic Metabolic Panel: Recent Labs  Lab 01/19/18 0335 01/20/18 0306 01/21/18 0406  NA 141 138 140  K 3.9 4.2 3.9  CL 98 98 102  CO2 31 28 32  GLUCOSE 70 67* 92  BUN 8 15 5*  CREATININE 4.14* 5.83* 3.63*  CALCIUM 7.8* 7.6* 7.6*  PHOS  --   --  2.1*   CBC: Recent Labs  Lab 01/15/18 1708  01/17/18 0308 01/18/18 0503 01/19/18 0335 01/20/18 0306 01/21/18 0406  WBC 8.5   < > 12.8* 11.5* 12.6* 10.3 10.7*  NEUTROABS 4.5  --   --   --   --   --   --   HGB 11.0*   < > 10.9* 11.1* 10.4* 10.1* 9.5*  HCT 37.8   < > 35.3* 35.9* 33.6* 32.8* 30.7*  MCV 90.6   < > 89.1 88.6 88.7 88.9 89.2  PLT 286   < > 146* 192 215 206 185   < > = values in this interval not displayed.   Blood Culture    Component Value Date/Time   SDES EXPECTORATED SPUTUM 01/12/2018 2140   SDES  EXPECTORATED SPUTUM 01/12/2018 2140   SPECREQUEST NONE 01/12/2018 2140   SPECREQUEST NONE Reflexed from [id number] 01/12/2018 2140   CULT RARE STAPHYLOCOCCUS AUREUS 01/12/2018 2140   REPTSTATUS 01/13/2018 FINAL 01/12/2018 2140   REPTSTATUS 01/16/2018 FINAL 01/12/2018 2140    Cardiac Enzymes: No results for input(s): CKTOTAL, CKMB, CKMBINDEX, TROPONINI in the last 168 hours. CBG: Recent Labs  Lab 01/16/18 1056 01/16/18 1058 01/16/18 1114 01/16/18 1127  GLUCAP 39* 49* 75 130*   Iron Studies: No results for input(s): IRON, TIBC, TRANSFERRIN, FERRITIN in the last 72 hours. Lab Results  Component Value Date   INR 1.54 01/18/2018   INR 1.91 01/17/2018   INR 2.36 01/16/2018   Medications:  . acetaminophen  1,000 mg Oral Q8H  . allopurinol  100 mg Oral Q T,Th,S,Su  . amLODipine  5 mg Oral QHS  . atorvastatin  40 mg Oral QHS  . Chlorhexidine Gluconate Cloth  6 each Topical Q0600  .  Chlorhexidine Gluconate Cloth  6 each Topical Q0600  . cinacalcet  90 mg Oral QHS  . clopidogrel  75 mg Oral Daily  . darbepoetin (ARANESP) injection - DIALYSIS  40 mcg Intravenous Q Wed-HD  . diclofenac sodium  2 g Topical QID  . doxercalciferol  1 mcg Intravenous Q M,W,F-HD  . DULoxetine  60 mg Oral QHS  . feeding supplement (NEPRO CARB STEADY)  237 mL Oral TID WC  . feeding supplement (PRO-STAT SUGAR FREE 64)  30 mL Oral BID  . heparin injection (subcutaneous)  5,000 Units Subcutaneous Q8H  . lidocaine  1 patch Transdermal Q24H  . losartan  100 mg Oral QHS  . metoprolol succinate  100 mg Oral QHS  . mometasone-formoterol  2 puff Inhalation BID  . multivitamin  1 tablet Oral QHS  . pantoprazole  40 mg Oral BID  . polyethylene glycol  17 g Oral Daily  . pregabalin  100 mg Oral QHS  . senna-docusate  1 tablet Oral BID  . sevelamer carbonate  2,400 mg Oral TID WC  . traZODone  50 mg Oral QHS

## 2018-01-22 NOTE — Care Management Important Message (Signed)
Important Message  Patient Details  Name: [name] MRN: [id number] Date of Birth: [birth year]   Medicare Important Message Given:  Yes    Neven Fina 01/22/2018, 3:20 PM

## 2018-01-22 NOTE — Progress Notes (Signed)
RN tried to call [name] and give report no success with try back again.

## 2018-01-22 NOTE — Care Management Important Message (Signed)
Important Message  Patient Details  Name: [name] MRN: [id number] Date of Birth: 06/02/1941   Medicare Important Message Given:  Yes    Orbie Pyo 01/22/2018, 3:32 PM

## 2018-01-25 ENCOUNTER — Encounter (INDEPENDENT_AMBULATORY_CARE_PROVIDER_SITE_OTHER): Payer: Self-pay

## 2018-01-25 ENCOUNTER — Telehealth (INDEPENDENT_AMBULATORY_CARE_PROVIDER_SITE_OTHER): Payer: Self-pay

## 2018-01-25 DIAGNOSIS — I82409 Acute embolism and thrombosis of unspecified deep veins of unspecified lower extremity: Secondary | ICD-10-CM | POA: Diagnosis not present

## 2018-01-25 DIAGNOSIS — I739 Peripheral vascular disease, unspecified: Secondary | ICD-10-CM | POA: Diagnosis not present

## 2018-01-25 DIAGNOSIS — N186 End stage renal disease: Secondary | ICD-10-CM | POA: Diagnosis not present

## 2018-01-25 DIAGNOSIS — S32599D Other specified fracture of unspecified pubis, subsequent encounter for fracture with routine healing: Secondary | ICD-10-CM | POA: Diagnosis not present

## 2018-01-25 NOTE — Telephone Encounter (Signed)
The patient is scheduled for 01/27/18, it was sitting in the depot to be put on the schedule. This was scheduled through her dialysis center and they were aware she was in a rehab center and gave me that information as well. The patient was adamant about not receiving care from any other practice.

## 2018-01-25 NOTE — Telephone Encounter (Addendum)
Nurse called and stated that this patient is now a resident at a Rehab center and that her transportation has to be coordinated through the facility, and they are just now finding out about her appointment here in our office on this coming Wednesday and this is going to keep the patient from having her dialysis in a timely manner.  Nurse states that she was given a piece of paper with an order on it, and it stated that his procedure was scheduled for 01/27/18. In the system there is no appointment scheduled for this patient, and neither is there any type of procedure. I told the nurse that the procedure that I can see is scheduled for 02/03/18. She seems to think that there is a mistake, between the information that she was given from the dialysis center, and with the date of the procedure needed.  She is calling the dialysis center right now to see what's going on?

## 2018-01-26 ENCOUNTER — Other Ambulatory Visit: Payer: Self-pay

## 2018-01-26 ENCOUNTER — Encounter (HOSPITAL_COMMUNITY): Payer: Self-pay | Admitting: *Deleted

## 2018-01-26 ENCOUNTER — Emergency Department (HOSPITAL_COMMUNITY)
Admission: EM | Admit: 2018-01-26 | Discharge: 2018-01-26 | Disposition: A | Discharge status: Home / Self Care | Payer: Medicare Other | Attending: Emergency Medicine | Admitting: Emergency Medicine

## 2018-01-26 ENCOUNTER — Emergency Department (HOSPITAL_COMMUNITY): Payer: Medicare Other

## 2018-01-26 DIAGNOSIS — Y998 Other external cause status: Secondary | ICD-10-CM | POA: Insufficient documentation

## 2018-01-26 DIAGNOSIS — I2510 Atherosclerotic heart disease of native coronary artery without angina pectoris: Secondary | ICD-10-CM | POA: Insufficient documentation

## 2018-01-26 DIAGNOSIS — S82432A Displaced oblique fracture of shaft of left fibula, initial encounter for closed fracture: Secondary | ICD-10-CM | POA: Diagnosis not present

## 2018-01-26 DIAGNOSIS — J449 Chronic obstructive pulmonary disease, unspecified: Secondary | ICD-10-CM | POA: Diagnosis not present

## 2018-01-26 DIAGNOSIS — I509 Heart failure, unspecified: Secondary | ICD-10-CM | POA: Insufficient documentation

## 2018-01-26 DIAGNOSIS — S8291XA Unspecified fracture of right lower leg, initial encounter for closed fracture: Secondary | ICD-10-CM | POA: Diagnosis not present

## 2018-01-26 DIAGNOSIS — Y9389 Activity, other specified: Secondary | ICD-10-CM | POA: Insufficient documentation

## 2018-01-26 DIAGNOSIS — W010XXA Fall on same level from slipping, tripping and stumbling without subsequent striking against object, initial encounter: Secondary | ICD-10-CM | POA: Insufficient documentation

## 2018-01-26 DIAGNOSIS — Z79899 Other long term (current) drug therapy: Secondary | ICD-10-CM | POA: Insufficient documentation

## 2018-01-26 DIAGNOSIS — F329 Major depressive disorder, single episode, unspecified: Secondary | ICD-10-CM | POA: Diagnosis not present

## 2018-01-26 DIAGNOSIS — S82891A Other fracture of right lower leg, initial encounter for closed fracture: Secondary | ICD-10-CM

## 2018-01-26 DIAGNOSIS — Z87891 Personal history of nicotine dependence: Secondary | ICD-10-CM | POA: Insufficient documentation

## 2018-01-26 DIAGNOSIS — Z9049 Acquired absence of other specified parts of digestive tract: Secondary | ICD-10-CM | POA: Insufficient documentation

## 2018-01-26 DIAGNOSIS — N186 End stage renal disease: Secondary | ICD-10-CM | POA: Insufficient documentation

## 2018-01-26 DIAGNOSIS — S82431A Displaced oblique fracture of shaft of right fibula, initial encounter for closed fracture: Secondary | ICD-10-CM | POA: Diagnosis not present

## 2018-01-26 DIAGNOSIS — Z8673 Personal history of transient ischemic attack (TIA), and cerebral infarction without residual deficits: Secondary | ICD-10-CM | POA: Insufficient documentation

## 2018-01-26 DIAGNOSIS — I132 Hypertensive heart and chronic kidney disease with heart failure and with stage 5 chronic kidney disease, or end stage renal disease: Secondary | ICD-10-CM | POA: Insufficient documentation

## 2018-01-26 DIAGNOSIS — Y929 Unspecified place or not applicable: Secondary | ICD-10-CM | POA: Insufficient documentation

## 2018-01-26 NOTE — ED Notes (Signed)
PT daughter would like pt to call her

## 2018-01-26 NOTE — ED Notes (Signed)
[facility] at bedside to transport pt home 

## 2018-01-26 NOTE — Discharge Instructions (Addendum)
No weight bearing on the right lower extremity  Call the orthopedist for follow up

## 2018-01-26 NOTE — ED Notes (Signed)
Pt given discharge paperwork, esignature not available. Pt understands to call ortho for follup appointment

## 2018-01-26 NOTE — Discharge Planning (Signed)
[facility] consulted regarding DME for pt.  Pt from [facility]; she will return to continue with PT.  EDCM confirmed that upon discharge from [facility], [place] will provide any DME needed.

## 2018-01-26 NOTE — ED Notes (Signed)
[facility] called @ 1411-[place], RN-called by Levada Dy

## 2018-01-26 NOTE — ED Notes (Signed)
This RN called ortho tech for placement of short leg splint

## 2018-01-26 NOTE — ED Notes (Signed)
  Pt transported to ct 

## 2018-01-26 NOTE — ED Triage Notes (Signed)
Patient fell on 8/21 and was seen and treated for pelvic fx. Was d/c'd to [facility] for rehab. C/o pain in her ankle  And states she wasn't able to bear weight on her foot today. States she had a mobile xray and it was fx.

## 2018-01-26 NOTE — ED Notes (Signed)
Gave pt [place] sandwich and coke

## 2018-01-26 NOTE — ED Notes (Signed)
Ortho placed right leg splint. Will call for [facility] to transport pt home

## 2018-01-26 NOTE — Progress Notes (Signed)
Orthopedic Tech Progress Note Patient Details:  [name] 1941/05/21 [id number] Pt unable to use crutches; doctor notified Ortho Devices Type of Ortho Device: Ace wrap, Post (short leg) splint Ortho Device/Splint Location: rle Ortho Device/Splint Interventions: Application   Post Interventions Patient Tolerated: Well Instructions Provided: Care of device   Hildred Priest 01/26/2018, 2:28 PM

## 2018-01-27 ENCOUNTER — Encounter
Admission: RE | Disposition: A | Discharge status: Home / Self Care | Payer: Self-pay | Source: Ambulatory Visit | Attending: Vascular Surgery

## 2018-01-27 ENCOUNTER — Encounter: Admission: RE | Payer: Self-pay | Source: Ambulatory Visit

## 2018-01-27 ENCOUNTER — Other Ambulatory Visit (INDEPENDENT_AMBULATORY_CARE_PROVIDER_SITE_OTHER): Payer: Self-pay | Admitting: Nurse Practitioner

## 2018-01-27 ENCOUNTER — Ambulatory Visit
Admission: RE | Admit: 2018-01-27 | Discharge: 2018-01-27 | Disposition: A | Discharge status: Home / Self Care | Payer: Medicare Other | Source: Ambulatory Visit | Attending: Vascular Surgery | Admitting: Vascular Surgery

## 2018-01-27 ENCOUNTER — Ambulatory Visit: Admission: RE | Admit: 2018-01-27 | Payer: Medicare Other | Source: Ambulatory Visit | Admitting: Vascular Surgery

## 2018-01-27 DIAGNOSIS — N186 End stage renal disease: Secondary | ICD-10-CM

## 2018-01-27 DIAGNOSIS — J449 Chronic obstructive pulmonary disease, unspecified: Secondary | ICD-10-CM | POA: Insufficient documentation

## 2018-01-27 DIAGNOSIS — Z880 Allergy status to penicillin: Secondary | ICD-10-CM | POA: Insufficient documentation

## 2018-01-27 DIAGNOSIS — I10 Essential (primary) hypertension: Secondary | ICD-10-CM

## 2018-01-27 DIAGNOSIS — E041 Nontoxic single thyroid nodule: Secondary | ICD-10-CM | POA: Insufficient documentation

## 2018-01-27 DIAGNOSIS — Z882 Allergy status to sulfonamides status: Secondary | ICD-10-CM | POA: Insufficient documentation

## 2018-01-27 DIAGNOSIS — Z992 Dependence on renal dialysis: Secondary | ICD-10-CM | POA: Diagnosis not present

## 2018-01-27 DIAGNOSIS — Z9889 Other specified postprocedural states: Secondary | ICD-10-CM | POA: Insufficient documentation

## 2018-01-27 DIAGNOSIS — G2581 Restless legs syndrome: Secondary | ICD-10-CM | POA: Insufficient documentation

## 2018-01-27 DIAGNOSIS — K589 Irritable bowel syndrome without diarrhea: Secondary | ICD-10-CM | POA: Insufficient documentation

## 2018-01-27 DIAGNOSIS — K219 Gastro-esophageal reflux disease without esophagitis: Secondary | ICD-10-CM | POA: Insufficient documentation

## 2018-01-27 DIAGNOSIS — I509 Heart failure, unspecified: Secondary | ICD-10-CM | POA: Insufficient documentation

## 2018-01-27 DIAGNOSIS — D649 Anemia, unspecified: Secondary | ICD-10-CM | POA: Diagnosis not present

## 2018-01-27 DIAGNOSIS — T82868A Thrombosis of vascular prosthetic devices, implants and grafts, initial encounter: Secondary | ICD-10-CM | POA: Insufficient documentation

## 2018-01-27 DIAGNOSIS — Z90710 Acquired absence of both cervix and uterus: Secondary | ICD-10-CM | POA: Insufficient documentation

## 2018-01-27 DIAGNOSIS — I770 Arteriovenous fistula, acquired: Secondary | ICD-10-CM | POA: Diagnosis not present

## 2018-01-27 DIAGNOSIS — Z8249 Family history of ischemic heart disease and other diseases of the circulatory system: Secondary | ICD-10-CM | POA: Insufficient documentation

## 2018-01-27 DIAGNOSIS — Y832 Surgical operation with anastomosis, bypass or graft as the cause of abnormal reaction of the patient, or of later complication, without mention of misadventure at the time of the procedure: Secondary | ICD-10-CM | POA: Insufficient documentation

## 2018-01-27 DIAGNOSIS — Z8719 Personal history of other diseases of the digestive system: Secondary | ICD-10-CM | POA: Insufficient documentation

## 2018-01-27 DIAGNOSIS — Z9049 Acquired absence of other specified parts of digestive tract: Secondary | ICD-10-CM | POA: Insufficient documentation

## 2018-01-27 DIAGNOSIS — Z87891 Personal history of nicotine dependence: Secondary | ICD-10-CM | POA: Insufficient documentation

## 2018-01-27 DIAGNOSIS — I7090 Unspecified atherosclerosis: Secondary | ICD-10-CM

## 2018-01-27 DIAGNOSIS — Z885 Allergy status to narcotic agent status: Secondary | ICD-10-CM | POA: Insufficient documentation

## 2018-01-27 DIAGNOSIS — Z881 Allergy status to other antibiotic agents status: Secondary | ICD-10-CM | POA: Insufficient documentation

## 2018-01-27 DIAGNOSIS — E7800 Pure hypercholesterolemia, unspecified: Secondary | ICD-10-CM | POA: Insufficient documentation

## 2018-01-27 DIAGNOSIS — S82891D Other fracture of right lower leg, subsequent encounter for closed fracture with routine healing: Secondary | ICD-10-CM | POA: Diagnosis not present

## 2018-01-27 DIAGNOSIS — I739 Peripheral vascular disease, unspecified: Secondary | ICD-10-CM | POA: Insufficient documentation

## 2018-01-27 DIAGNOSIS — F329 Major depressive disorder, single episode, unspecified: Secondary | ICD-10-CM | POA: Insufficient documentation

## 2018-01-27 DIAGNOSIS — I132 Hypertensive heart and chronic kidney disease with heart failure and with stage 5 chronic kidney disease, or end stage renal disease: Secondary | ICD-10-CM | POA: Diagnosis not present

## 2018-01-27 DIAGNOSIS — R296 Repeated falls: Secondary | ICD-10-CM | POA: Insufficient documentation

## 2018-01-27 DIAGNOSIS — Z8673 Personal history of transient ischemic attack (TIA), and cerebral infarction without residual deficits: Secondary | ICD-10-CM | POA: Insufficient documentation

## 2018-01-27 DIAGNOSIS — R569 Unspecified convulsions: Secondary | ICD-10-CM | POA: Insufficient documentation

## 2018-01-27 HISTORY — PX: PERIPHERAL VASCULAR THROMBECTOMY: CATH118306

## 2018-01-27 LAB — POTASSIUM (ARMC VASCULAR LAB ONLY): Potassium (ARMC vascular lab): 5.1 (ref 3.5–5.1)

## 2018-01-27 SURGERY — A/V SHUNTOGRAM
Anesthesia: Moderate Sedation | Laterality: Left

## 2018-01-27 SURGERY — PERIPHERAL VASCULAR THROMBECTOMY
Anesthesia: Moderate Sedation | Laterality: Left

## 2018-01-27 MED ORDER — MIDAZOLAM HCL 5 MG/5ML IJ SOLN
INTRAMUSCULAR | Status: AC
  Filled 2018-01-27: qty 5

## 2018-01-27 MED ORDER — FENTANYL CITRATE (PF) 100 MCG/2ML IJ SOLN
INTRAMUSCULAR | Status: DC | PRN
  Administered 2018-01-27: 25 ug via INTRAVENOUS

## 2018-01-27 MED ORDER — HEPARIN SODIUM (PORCINE) 1000 UNIT/ML IJ SOLN
INTRAMUSCULAR | Status: DC | PRN
  Administered 2018-01-27: 3000 [IU] via INTRAVENOUS

## 2018-01-27 MED ORDER — DEXTROSE 5 % IV SOLN: 300.0000 mg | Freq: Once | INTRAVENOUS | Status: DC

## 2018-01-27 MED ORDER — CLINDAMYCIN PHOSPHATE 300 MG/50ML IV SOLN
INTRAVENOUS | Status: AC
  Administered 2018-01-27: 300 mg via INTRAVENOUS
  Filled 2018-01-27: qty 50

## 2018-01-27 MED ORDER — SODIUM CHLORIDE 0.9 % IV SOLN: INTRAVENOUS | Status: DC

## 2018-01-27 MED ORDER — ONDANSETRON HCL 4 MG/2ML IJ SOLN: 4.0000 mg | Freq: Four times a day (QID) | INTRAMUSCULAR | Status: DC | PRN

## 2018-01-27 MED ORDER — ALTEPLASE 2 MG IJ SOLR
INTRAMUSCULAR | Status: DC | PRN
  Administered 2018-01-27: 4 mg

## 2018-01-27 MED ORDER — HEPARIN (PORCINE) IN NACL 1000-0.9 UT/500ML-% IV SOLN
INTRAVENOUS | Status: AC
  Filled 2018-01-27: qty 1000

## 2018-01-27 MED ORDER — CLINDAMYCIN PHOSPHATE 300 MG/50ML IV SOLN
300.0000 mg | Freq: Once | INTRAVENOUS | Status: AC
  Administered 2018-01-27: 300 mg via INTRAVENOUS

## 2018-01-27 MED ORDER — FENTANYL CITRATE (PF) 100 MCG/2ML IJ SOLN
INTRAMUSCULAR | Status: AC
  Filled 2018-01-27: qty 2

## 2018-01-27 MED ORDER — ALTEPLASE 2 MG IJ SOLR
INTRAMUSCULAR | Status: AC
  Filled 2018-01-27: qty 4

## 2018-01-27 MED ORDER — IOPAMIDOL (ISOVUE-300) INJECTION 61%
INTRAVENOUS | Status: DC | PRN
  Administered 2018-01-27: 35 mL via INTRA_ARTERIAL

## 2018-01-27 MED ORDER — LIDOCAINE-EPINEPHRINE (PF) 1 %-1:200000 IJ SOLN
INTRAMUSCULAR | Status: AC
  Filled 2018-01-27: qty 30

## 2018-01-27 MED ORDER — HYDROMORPHONE HCL 1 MG/ML IJ SOLN: 1.0000 mg | Freq: Once | INTRAMUSCULAR | Status: DC

## 2018-01-27 MED ORDER — MIDAZOLAM HCL 2 MG/2ML IJ SOLN
INTRAMUSCULAR | Status: DC | PRN
  Administered 2018-01-27: 1 mg via INTRAVENOUS

## 2018-01-27 MED ORDER — HEPARIN SODIUM (PORCINE) 1000 UNIT/ML IJ SOLN
INTRAMUSCULAR | Status: AC
  Filled 2018-01-27: qty 1

## 2018-01-27 SURGICAL SUPPLY — 14 items
BALLN DORADO7X100X80 (BALLOONS) ×3
BALLOON DORADO7X100X80 (BALLOONS) IMPLANT
CANNULA 5F STIFF (CANNULA) ×2 IMPLANT
CATH BEACON 5 .035 40 KMP TP (CATHETERS) IMPLANT
CATH BEACON 5 .038 40 KMP TP (CATHETERS) ×2
CATH EMBOLECTOMY 5FR (BALLOONS) ×6 IMPLANT
DEVICE PRESTO INFLATION (MISCELLANEOUS) ×2 IMPLANT
DRAPE BRACHIAL (DRAPES) ×2 IMPLANT
PACK ANGIOGRAPHY (CUSTOM PROCEDURE TRAY) ×3 IMPLANT
SET AVX THROMB ULT (MISCELLANEOUS) ×2 IMPLANT
SHEATH BRITE TIP 6FRX5.5 (SHEATH) ×4 IMPLANT
SUT MNCRL AB 4-0 PS2 18 (SUTURE) ×2 IMPLANT
TOWEL OR 17X26 4PK STRL BLUE (TOWEL DISPOSABLE) ×2 IMPLANT
WIRE MAGIC TOR.035 180C (WIRE) ×4 IMPLANT

## 2018-01-27 NOTE — Op Note (Signed)
[facility]    OPERATIVE NOTE   PROCEDURE: 1.  Left arm HeRO graft cannulation under ultrasound guidance in both a retrograde and then antegrade fashion crossing 2.  Left arm shuntogram and central venogram 3.  Catheter directed thrombolysis with 4 mg of TPA delivered with the AngioJet AVX catheter 4.  Mechanical rheolytic thrombectomy to the HeRO graft with the AngioJet AVX catheter 5.  Fogarty embolectomy for residual arterial plug 6.  Percutaneous transluminal angioplasty of the mid and distal portions of the graft with 2 inflations with a 7 mm diameter by 10 cm length high-pressure angioplasty balloon  PRE-OPERATIVE DIAGNOSIS: 1. ESRD 2.  Thrombosed left arm HeRO graft  POST-OPERATIVE DIAGNOSIS: same as above   SURGEON: Brianna Pain, MD  ANESTHESIA: local with Moderate Conscious Sedation   ESTIMATED BLOOD LOSS: 10 cc  FINDING(S): 1. Thrombosed HeRO graft, opened with intervention  SPECIMEN(S):  None  CONTRAST: 35 cc  FLUORO TIME: 7.6 minutes  MODERATE CONSCIOUS SEDATION TIME: Approximately 45 minutes with 1 mg of Versed and 25 Mcg of Fentanyl  INDICATIONS: Patient is a 77 y.o.female who presents with a thrombosed left arm HeRO graft.  The patient is scheduled for an attempted declot and shuntogram.  The patient is aware the risks include but are not limited to: bleeding, infection, thrombosis of the cannulated access, and possible anaphylactic reaction to the contrast.  The patient is aware of the risks of the procedure and elects to proceed forward.  DESCRIPTION: After full informed written consent was obtained, the patient was brought back to the angiography suite and placed supine upon the angiography table.  The patient was connected to monitoring equipment.  Moderate conscious sedation was administered during a face to face encounter with the patient throughout the procedure with my supervision of the RN administering medicines and monitoring the  patient's vital signs and mental status throughout from the start of the procedure until the patient was taken to the recovery room.  The left arm was prepped and draped in the standard fashion for a percutaneous access intervention.  Under ultrasound guidance, the left arm HeRO graft was cannulated with a micropuncture needle under direct ultrasound guidance due to the pulseless nature of the graft in both an antegrade and a retrograde fashion crossing, and permanent images were performed.  The microwire was advanced and the needle was exchanged for the a microsheath.  I then upsized to a 6 Fr Sheath and imaging was performed.  Hand injections were completed to image the access including the central venous system. This demonstrated no flow within the HeRO graft.  Based on the images, this patient will need extensive treatment to salvage the graft. I then gave the patient 3000 units of intravenous heparin.  I then placed a Magic torque wire into the brachial artery from the retrograde sheath and into the right atrium from the antegrade sheath. 4 mg of TPA were deployed throughout the entirety of the PTFE portion of the graft and into the brachial artery at the anastomosis. Mechanical rheolytic thrombectomy was then performed throughout the PTFE portion of the HeRO graft and into the brachial artery at the anastomosis. This uncovered a moderate stenosis in the graft near the grommet and a high-grade stenosis in the midportion of the PTFE portion of the graft and a residual arterial plug was also seen at the arterial anastomosis. An attempt to clear the arterial plug was done with 4 passes of the Fogarty embolectomy balloon. This resulted in  resolution of the arterial plug, and clearance of the arterial side of the graft. The arterial outflow was seen to be intact distal to the graft on these images she had a strongly palpable radial pulse. The retrograde sheath was removed as a 4-0 Monocryl pursestring suture was  placed. I then turned my attention to the thrombus in the distal graft. Mechanical rheolytic thrombectomy was performed again. This resulted in minimal improvement.  I then elected to treat this with a 7 mm diameter by 10 cm length high-pressure angioplasty balloon.  This was inflated first starting at the grommet and encompassing the distal PTFE portion of the device and inflated to 16 atm for 1 minute.  It was then pulled back to the midportion of the graft and inflated to 20 atm for 1 minute.  Completion imaging showed less than 20% residual stenosis and no residual thrombus left in the graft and clinically there was good flow within the graft.    Based on the completion imaging, no further intervention is necessary.  The wire and balloon were removed from the sheath.  A 4-0 Monocryl purse-string suture was sewn around the sheath.  The sheath was removed while tying down the suture.  A sterile bandage was applied to the puncture site.  COMPLICATIONS: None  CONDITION: Stable   [name] 01/27/2018 1:55 PM   This note was created with Dragon Medical transcription system. Any errors in dictation are purely unintentional.

## 2018-01-27 NOTE — H&P (Signed)
[name] Admission History & Physical  MRN : [id number]  [name] is a 77 y.o. (1940/10/17) female who presents with chief complaint of No chief complaint on file. Marland Kitchen  History of Present Illness: I am asked to evaluate the patient by the dialysis center. The patient was sent here because they were unable to cannulate the access this week. Furthermore the Center states there is no thrill or bruit. The patient states this is the first dialysis run to be missed. This problem is acute in onset and has been present for approximately 2-3 days. The patient is unaware of any other change.  Patient denies pain or tenderness overlying the access.  There is no pain with dialysis.  The patient denies hand pain or finger pain consistent with steal syndrome.   There have been past interventions or declots of this access.  The patient is not chronically hypotensive on dialysis.  Current Facility-Administered Medications  Medication Dose Route Frequency Provider Last Rate Last Dose  . 0.9 %  sodium chloride infusion   Intravenous Continuous Eulogio Ditch E, NP      . clindamycin (CLEOCIN) 300 MG/50ML IVPB           . clindamycin (CLEOCIN) IVPB 300 mg  300 mg Intravenous Once Eulogio Ditch E, NP      . HYDROmorphone (DILAUDID) injection 1 mg  1 mg Intravenous Once Kris Hartmann, NP      . ondansetron [facility]) injection 4 mg  4 mg Intravenous Q6H PRN Kris Hartmann, NP        Past Medical History:  Diagnosis Date  . Abnormality of gait 03/28/2015  . Adrenal mass ([place])   . 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 Arthritis   . Back pain   . CHF (congestive heart failure) ([place])   . Chronic kidney disease    Hemo MWF  . Congestion of throat    Pt states she has a lot mucus in back throat.  . Constipation   . COPD (chronic obstructive pulmonary disease) ([place])   . Depression   . Dialysis patient [facility])    kidney  . Diverticulitis    . GERD (gastroesophageal reflux disease)   . H/O hiatal hernia   . High cholesterol   . Hoarseness of voice   . Hyperlipidemia   . Hypertension   . IBS (irritable bowel syndrome)   . Meralgia paresthetica of right side 12/26/2014  . Normal cardiac stress test 12/24/2009   lexiscan, imaging normal  . PAD (peripheral artery disease) ([place]) 12/21/2017  . Pneumonia    [id number]  . Renal disorder   . RLS (restless legs syndrome)   . Seizures ([place])    2004 past brain surgery  . Sinus complaint   . Stroke ([place])    TIAs per patient 2 or 3  . Thyroid disease   . TIA (transient ischemic attack)   . Tubular adenoma of colon 01/2008    Past Surgical History:  Procedure Laterality Date  . A/V SHUNT INTERVENTION N/A 08/13/2017   Procedure: A/V SHUNT INTERVENTION;  Surgeon: Algernon Huxley, MD;  Location: [place] [facility];  Service: Cardiovascular;  Laterality: N/A;  . A/V SHUNTOGRAM Left 08/13/2017   Procedure: A/V SHUNTOGRAM;  Surgeon: Algernon Huxley, MD;  Location: [place] [facility];  Service: Cardiovascular;  Laterality: Left;  . ABDOMINAL HYSTERECTOMY    . ANAL RECTAL MANOMETRY  N/A 07/25/2015   Procedure: ANO RECTAL MANOMETRY;  Surgeon: Mauri Pole, MD;  Location: [facility];  Service: Endoscopy;  Laterality: N/A;  . APPENDECTOMY    . AV FISTULA PLACEMENT Left 11/11/2012   Procedure: INSERTION OF ARTERIOVENOUS (AV) GORE-TEX GRAFT ARM;  Surgeon: Angelia Mould, MD;  Location: [place];  Service: Vascular;  Laterality: Left;  . [place] REMOVAL Left 11/18/2012   Procedure: REMOVAL OF LEFT UPPER ARM ARTERIOVENOUS GORETEX GRAFT ([place]);  Surgeon: Angelia Mould, MD;  Location: [place];  Service: Vascular;  Laterality: Left;  . CARDIAC CATHETERIZATION  2003   normal  . CHOLECYSTECTOMY     Open mid line incision  . frontal craniotomy  2002   indication = sinusitis  . INSERTION OF DIALYSIS CATHETER     Left  . LOWER EXTREMITY ANGIOGRAPHY Right 12/17/2017   Procedure: LOWER  EXTREMITY ANGIOGRAPHY;  Surgeon: Algernon Huxley, MD;  Location: [place] [facility];  Service: Cardiovascular;  Laterality: Right;  . NASAL SINUS SURGERY Bilateral 2002  . PATCH ANGIOPLASTY Left 11/18/2012   Procedure: PATCH ANGIOPLASTY;  Surgeon: Angelia Mould, MD;  Location: [place];  Service: Vascular;  Laterality: Left;  . PERIPHERAL VASCULAR CATHETERIZATION Left 03/08/2015   Procedure: A/V Shuntogram/Fistulagram;  Surgeon: Algernon Huxley, MD;  Location: [place] [facility];  Service: Cardiovascular;  Laterality: Left;  . PERIPHERAL VASCULAR CATHETERIZATION Left 03/08/2015   Procedure: A/V Shunt Intervention;  Surgeon: Algernon Huxley, MD;  Location: [place] [facility];  Service: Cardiovascular;  Laterality: Left;  . RECTAL ULTRASOUND N/A 10/05/2015   Procedure: ANAL ULTRASOUND WITH PROBE;  Surgeon: Leighton Ruff, MD;  [facility];  Service: Endoscopy;  Laterality: N/A;  . REVISON OF ARTERIOVENOUS FISTULA  05/11/2012   Procedure: REVISON OF ARTERIOVENOUS FISTULA;  Surgeon: Elam Dutch, MD;  Location: [place];  Service: Vascular;  Laterality: Right;  . TUBAL LIGATION      Social History Social History   Tobacco Use  . Smoking status: Former Smoker    Packs/day: 1.00    Years: 60.00    Pack years: 60.00    Types: Cigarettes    Last attempt to quit: 12/17/2017    Years since quitting: 0.1  . Smokeless tobacco: Never Used  . Tobacco comment: smoker since 77 yo.  1.5 ppd of [place] 100 lights. ; form given 10-23-16  Substance Use Topics  . Alcohol use: No    Alcohol/week: 0.0 standard drinks  . Drug use: No    Comment: No hx of IV drug use    Family History Family History  Problem Relation Age of Onset  . Thyroid cancer Mother   . Heart disease Father   . Hypertension Father   . Heart attack Father   . Lupus Daughter   . Breast cancer Sister   . Thyroid cancer Sister   . Kidney disease Sister   . Colon cancer Neg Hx     No family history of bleeding or clotting  disorders, autoimmune disease or porphyria  Allergies  Allergen Reactions  . Tuberculin Tests Hives    "blisters"  . Valacyclovir Other (See Comments)    Confusion and nervousness  . Codeine Nausea And Vomiting  . Penicillins Rash    No problems breathing. Has tolerated omnicef in past without issue Has patient had a PCN reaction causing immediate rash, facial/tongue/throat swelling, SOB or lightheadedness with hypotension: Yes Has patient had a PCN reaction causing severe rash involving mucus membranes or  skin necrosis: No Has patient had a PCN reaction that required hospitalization No Has patient had a PCN reaction occurring within the last 10 years: No If all of the above answers are "NO", then may proceed with Cephalosporin  . Sulfamethoxazole Rash     REVIEW OF SYSTEMS (Negative unless checked)  Constitutional: [] Weight loss  [] Fever  [] Chills Cardiac: [] Chest pain   [] Chest pressure   [] Palpitations   [] Shortness of breath when laying flat   [] Shortness of breath at rest   [x] Shortness of breath with exertion. Vascular:  [x] Pain in legs with walking   [x] Pain in legs at rest   [] Pain in legs when laying flat   [] Claudication   [] Pain in feet when walking  [] Pain in feet at rest  [] Pain in feet when laying flat   [] History of DVT   [] Phlebitis   [] Swelling in legs   [] Varicose veins   [x] Non-healing ulcers Pulmonary:   [] Uses home oxygen   [x] Productive cough   [] Hemoptysis   [] Wheeze  [x] COPD   [] Asthma Neurologic:  [] Dizziness  [] Blackouts   [] Seizures   [] History of stroke   [] History of TIA  [] Aphasia   [] Temporary blindness   [] Dysphagia   [] Weakness or numbness in arms   [] Weakness or numbness in legs Musculoskeletal:  [x] Arthritis   [] Joint swelling   [] Joint pain   [] Low back pain Hematologic:  [] Easy bruising  [] Easy bleeding   [] Hypercoagulable state   [x] Anemic  [] Hepatitis Gastrointestinal:  [] Blood in stool   [] Vomiting blood  [] Gastroesophageal reflux/heartburn    [] Difficulty swallowing. Genitourinary:  [x] Chronic kidney disease   [] Difficult urination  [] Frequent urination  [] Burning with urination   [] Blood in urine Skin:  [] Rashes   [x] Ulcers   [] Wounds Psychological:  [] History of anxiety   []  History of major depression.  Physical Examination  Vitals:   01/27/18 1045  BP: (!) 134/53  Pulse: 73  Resp: 13  Temp: (!) 97.5 F (36.4 C)  TempSrc: Oral  SpO2: 99%  Weight: 68.5 kg  Height: 5\' 1"  (1.549 m)   Body mass index is 28.53 kg/m. Gen: WD/WN, NAD Head: [place]/AT, No temporalis wasting.  Ear/Nose/Throat: Hearing grossly intact, nares w/o erythema or drainage, oropharynx w/o Erythema/Exudate,  Eyes: Conjunctiva clear, sclera non-icteric Neck: Trachea midline.  No JVD.  Pulmonary:  Good air movement, respirations not labored, no use of accessory muscles.  Cardiac: irregular Vascular: No thrill in left arm AVG Vessel Right Left  Radial Palpable Palpable               Musculoskeletal: M/S 5/5 throughout. No deformity or atrophy.  Neurologic: Sensation grossly intact in extremities.  Symmetrical.  Speech is fluent. Motor exam as listed above. Psychiatric: Judgment intact, Mood & affect appropriate for pt's clinical situation. Dermatologic: No rashes or ulcers noted.  No cellulitis or open wounds. Lymph : No Cervical, Axillary, or Inguinal lymphadenopathy.   CBC Lab Results  Component Value Date   WBC 7.2 01/22/2018   HGB 11.0 (L) 01/22/2018   HCT 36.0 01/22/2018   MCV 90.5 01/22/2018   PLT 212 01/22/2018    BMET    Component Value Date/Time   NA 140 01/22/2018 1155   NA 139 01/08/2017 1324   NA 136 07/07/2013 1334   K 4.2 01/22/2018 1155   K 4.0 07/07/2013 1334   CL 101 01/22/2018 1155   CL 100 07/07/2013 1334   CO2 27 01/22/2018 1155   CO2 29 07/07/2013 1334   GLUCOSE 84  01/22/2018 1155   GLUCOSE 75 07/07/2013 1334   BUN 15 01/22/2018 1155   BUN 13 01/08/2017 1324   BUN 30 (H) 07/07/2013 1334   CREATININE 6.39  (H) 01/22/2018 1155   CREATININE 7.07 (H) 07/07/2013 1334   CREATININE 4.92 (H) 03/11/2012 1218   CALCIUM 8.1 (L) 01/22/2018 1155   CALCIUM 10.0 07/07/2013 1334   CALCIUM 8.4 05/06/2010 0515   GFRNONAA 6 (L) 01/22/2018 1155   GFRNONAA 5 (L) 07/07/2013 1334   GFRAA 7 (L) 01/22/2018 1155   GFRAA 6 (L) 07/07/2013 1334   Estimated Creatinine Clearance: 6.6 mL/min (A) (by C-G formula based on SCr of 6.39 mg/dL (H)).  COAG Lab Results  Component Value Date   INR 1.54 01/18/2018   INR 1.91 01/17/2018   INR 2.36 01/16/2018    Radiology Dg Chest 2 View  Result Date: 01/15/2018 CLINICAL DATA:  Fall leg pain EXAM: CHEST - 2 VIEW COMPARISON:  01/12/2018, 12/21/2017, 08/20/2017 FINDINGS: Left-sided central venous catheter tip projects over the right atrium. Right mid lung opacity has resolved. Chronic elevation of right diaphragm. Low lung volume. Mildly enlarged cardiomediastinal silhouette with aortic atherosclerosis. No pneumothorax. IMPRESSION: No active cardiopulmonary disease.  Cardiomegaly. Electronically Signed   By: Donavan Foil M.D.   On: 01/15/2018 18:36   Dg Thoracic Spine 2 View  Result Date: 01/16/2018 CLINICAL DATA:  Mid back pain. EXAM: THORACIC SPINE 2 VIEWS COMPARISON:  01/15/2018 and prior chest radiographs FINDINGS: No acute fracture or subluxation. Mild multilevel degenerative disc disease in the LOWER thoracic spine again noted. No focal bony lesions are present. A LEFT IJ central venous catheter is again noted. IMPRESSION: 1. No acute abnormality 2. Mild multilevel degenerative changes in the LOWER thoracic spine. Electronically Signed   By: Margarette Canada M.D.   On: 01/16/2018 13:52   Dg Lumbar Spine 2-3 Views  Result Date: 01/16/2018 CLINICAL DATA:  Chronic low back pain. EXAM: LUMBAR SPINE - 2-3 VIEW COMPARISON:  08/20/2017 CT and prior studies FINDINGS: No acute fracture or subluxation noted. Mild to moderate multilevel degenerative disc disease, spondylosis and facet  arthropathy noted. No focal bony lesions identified. Aortic atherosclerotic calcifications again noted. IMPRESSION: 1. No acute abnormality 2. Mild to moderate multilevel degenerative changes. 3.  Aortic Atherosclerosis (ICD10-I70.0). Electronically Signed   By: Margarette Canada M.D.   On: 01/16/2018 13:51   Dg Knee 2 Views Right  Result Date: 01/15/2018 CLINICAL DATA:  Fall with knee pain EXAM: RIGHT KNEE - 1-2 VIEW COMPARISON:  10/05/2003 FINDINGS: Stent in the mid to distal thigh. No fracture or malalignment. Small knee effusion. Mild degenerative change of the medial compartment. IMPRESSION: No acute osseous abnormality.  Small knee effusion. Electronically Signed   By: Donavan Foil M.D.   On: 01/15/2018 18:39   Dg Ankle Complete Right  Result Date: 01/26/2018 CLINICAL DATA:  Fall.  Ankle pain EXAM: RIGHT ANKLE - COMPLETE 3+ VIEW COMPARISON:  No recent prior. FINDINGS: Slight displaced oblique fracture of the distal fibula noted. Slight displaced fracture of the distal tip of the medial malleolus. No other focal abnormalities identified. Diffuse osteopenia degenerative change. IMPRESSION: 1. Slight displaced oblique fracture of the distal fibula. Slight displaced fracture of the distal tip of the medial malleolus. Electronically Signed   By: Marcello Moores  Register   On: 01/26/2018 13:03   Ct Head Wo Contrast  Result Date: 01/15/2018 CLINICAL DATA:  77 y/o F; 2 falls today. Word-finding difficulty. Right knee pain. Missed dialysis today. EXAM: CT HEAD WITHOUT CONTRAST  CT CERVICAL SPINE WITHOUT CONTRAST TECHNIQUE: Multidetector CT imaging of the head and cervical spine was performed following the standard protocol without intravenous contrast. Multiplanar CT image reconstructions of the cervical spine were also generated. COMPARISON:  12/25/2017 CT head. FINDINGS: CT HEAD FINDINGS Brain: No evidence of acute infarction, hemorrhage, hydrocephalus, extra-axial collection or mass lesion/mass effect. Left anterior  frontal stable encephalomalacia. Empty sella turcica. Vascular: Calcific atherosclerosis of carotid siphons. No hyperdense vessel. Skull: Left parietal scalp contusion. No calvarial fracture. Chronic postsurgical changes related to bifrontal craniotomy. Sinuses/Orbits: Left-sided ethmoidectomy and maxillary antrostomy postsurgical changes. Mild mucosal thickening of the paranasal sinuses. Normal aeration of the mastoid air cells. Bilateral intra-ocular lens replacement. Other: None. CT CERVICAL SPINE FINDINGS Alignment: C4-5 and C5-6 grade 1 anterolisthesis. Skull base and vertebrae: No acute fracture. No primary bone lesion or focal pathologic process. Soft tissues and spinal canal: No prevertebral fluid or swelling. No visible canal hematoma. 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 Cervical spondylosis with mild multilevel disc and moderate facet arthrosis. No high-grade bony canal stenosis. Upper chest: Left subclavian stent partially visualized. Calcific atherosclerosis of the carotid bifurcations and the aorta. Other: 10 mm nodule in the left lobe of the thyroid. IMPRESSION: 1. Left parietal scalp contusion. 2. No calvarial fracture or acute intracranial abnormality. 3. No acute fracture or dislocation of the cervical spine. 4. Stable left anterior frontal lobe encephalomalacia and empty sella turcica. 5. Mild paranasal sinus mucosal thickening. 6. 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 Electronically Signed   By: Kristine Garbe M.D.   On: 01/15/2018 19:08   Ct Cervical Spine Wo Contrast  Result Date: 01/15/2018 CLINICAL DATA:  77 y/o F; 2 falls today. Word-finding difficulty. Right knee pain. Missed dialysis today. EXAM: CT HEAD WITHOUT CONTRAST CT CERVICAL SPINE WITHOUT CONTRAST TECHNIQUE: Multidetector CT imaging of the head and cervical spine was performed following the standard  protocol without intravenous contrast. Multiplanar CT image reconstructions of the cervical spine were also generated. COMPARISON:  12/25/2017 CT head. FINDINGS: CT HEAD FINDINGS Brain: No evidence of acute infarction, hemorrhage, hydrocephalus, extra-axial collection or mass lesion/mass effect. Left anterior frontal stable encephalomalacia. Empty sella turcica. Vascular: Calcific atherosclerosis of carotid siphons. No hyperdense vessel. Skull: Left parietal scalp contusion. No calvarial fracture. Chronic postsurgical changes related to bifrontal craniotomy. Sinuses/Orbits: Left-sided ethmoidectomy and maxillary antrostomy postsurgical changes. Mild mucosal thickening of the paranasal sinuses. Normal aeration of the mastoid air cells. Bilateral intra-ocular lens replacement. Other: None. CT CERVICAL SPINE FINDINGS Alignment: C4-5 and C5-6 grade 1 anterolisthesis. Skull base and vertebrae: No acute fracture. No primary bone lesion or focal pathologic process. Soft tissues and spinal canal: No prevertebral fluid or swelling. No visible canal hematoma. 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 Cervical spondylosis with mild multilevel disc and moderate facet arthrosis. No high-grade bony canal stenosis. Upper chest: Left subclavian stent partially visualized. Calcific atherosclerosis of the carotid bifurcations and the aorta. Other: 10 mm nodule in the left lobe of the thyroid. IMPRESSION: 1. Left parietal scalp contusion. 2. No calvarial fracture or acute intracranial abnormality. 3. No acute fracture or dislocation of the cervical spine. 4. Stable left anterior frontal lobe encephalomalacia and empty sella turcica. 5. Mild paranasal sinus mucosal thickening. 6. 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 Electronically Signed   By: Kristine Garbe M.D.   On: 01/15/2018 19:08  Mr Brain Wo  Contrast  Result Date: 01/15/2018 CLINICAL DATA:  Focal neuro deficit, > 6 hrs, stroke suspected. Word-finding difficulty and multiple falls EXAM: MRI HEAD WITHOUT CONTRAST TECHNIQUE: Multiplanar, multiecho pulse sequences of the brain and surrounding structures were obtained without intravenous contrast. COMPARISON:  Head CT 01/15/2018 FINDINGS: The examination had to be discontinued prior to completion due to patient inability to cooperate with the technologist's instructions. The patient was attempting to remove the head coil. There are 9 series provided. Axial T1-weighted imaging and coronal T2-weighted imaging was not obtained. BRAIN: There is no acute infarct, acute hemorrhage or mass effect. Partially empty sella. Left frontal encephalomalacia is unchanged. 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 White matter signal is otherwise normal. The CSF spaces are normal for age, with no hydrocephalus. Susceptibility-sensitive sequences show no chronic microhemorrhage or superficial siderosis. VASCULAR: Major intracranial arterial and venous sinus flow voids are preserved. SKULL AND UPPER CERVICAL SPINE: Remote bifrontal craniotomy. Large left parietal scalp hematoma. SINUSES/ORBITS: No fluid levels or advanced mucosal thickening. No mastoid or middle ear effusion. There are bilateral lens replacements. IMPRESSION: 1. Truncated examination.  No acute infarct. 2. Left frontal encephalomalacia and multiple punctate, old cerebellar infarcts. 3. Large left parietal scalp hematoma. Electronically Signed   By: Ulyses Jarred M.D.   On: 01/15/2018 22:51   Mr Lumbar Spine Wo Contrast  Result Date: 01/17/2018 CLINICAL DATA:  Right lower extremity weakness. Multiple falls recently. EXAM: MRI LUMBAR SPINE WITHOUT CONTRAST TECHNIQUE: Multiplanar, multisequence MR imaging of the lumbar spine was performed. No intravenous contrast was administered. COMPARISON:  MRI lumbar spine dated January 17, 2017. FINDINGS: Segmentation:   Standard. Alignment:  Unchanged trace anterolisthesis at L4-L5. Vertebrae: Unchanged heterogeneous marrow signal without focal lesion. No fracture or evidence of discitis. Scattered chronic degenerative endplate changes. Conus medullaris and cauda equina: Conus extends to the L2 level. Conus and cauda equina appear normal. No epidural hematoma. Paraspinal and other soft tissues: Unchanged bilateral renal cortical atrophy and multiple simple cysts. Unchanged T1 hyperintense, T2 iso- to hypointense lesion in the left kidney, likely a hemorrhagic or proteinaceous cyst. Colonic diverticulosis. Disc levels: T9-T10: Mild disc degeneration and facet arthropathy with mild right neuroforaminal stenosis. T10-T11: Mild disc degeneration and facet arthropathy with mild right neuroforaminal stenosis. T11-T12: Mild disc degeneration and facet arthropathy with mild right neuroforaminal stenosis. T12-L1:  Negative. L1-L2:  Negative. L2-L3: Diffuse disc bulge, eccentric to the right. Mild bilateral facet arthropathy. Mild-to-moderate central spinal canal stenosis. No neuroforaminal stenosis. Findings are similar to prior study. L3-L4: Small disc bulge and moderate bilateral facet arthropathy with ligamentum flavum hypertrophy mild central spinal canal stenosis. Moderate left lateral recess stenosis. No neuroforaminal stenosis. Findings have progressed when compared to prior study. L4-L5: Diffuse disc bulge and moderate bilateral facet arthropathy with small facet joint effusions. Ligamentum flavum hypertrophy. Mild central spinal canal and bilateral recess stenosis. No neuroforaminal stenosis. Findings are similar to prior study. L5-S1: Normal disc. Severe bilateral facet arthropathy. Mild bilateral neuroforaminal stenosis. No spinal canal stenosis. Findings are similar to prior study. IMPRESSION: 1.  No acute abnormality. 2. Multilevel degenerative changes of the lumbar spine as described above, slightly progressed at L3-L4 where  there is new mild central spinal canal stenosis and moderate left lateral recess stenosis. 3. Unchanged mild to moderate central spinal canal stenosis at L2-L3. Unchanged mild bilateral neuroforaminal stenosis at L5-S1. 4. Unchanged moderate to severe lumbar facet arthropathy. Electronically Signed   By: Titus Dubin M.D.   On: 01/17/2018 14:37  Mr Pelvis Wo Contrast  Result Date: 01/17/2018 CLINICAL DATA:  Two falls over the past few days. Possible inferior pubic ramus fracture on x-ray. EXAM: MRI PELVIS WITHOUT CONTRAST TECHNIQUE: Multiplanar multisequence MR imaging of the pelvis was performed. No intravenous contrast was administered. COMPARISON:  Bilateral hip x-rays dated January 15, 2018. CT abdomen pelvis dated August 20, 2017. FINDINGS: Bones: There is no evidence of acute fracture, dislocation or avascular necrosis. Heterogeneous marrow signal without focal lesion. Small bilateral sacroiliac joint effusions, likely degenerative. Mild degenerative changes of the pubic symphysis. Articular cartilage and labrum Articular cartilage: No focal chondral defect or subchondral signal abnormality identified. Labrum: There is no gross labral tear or paralabral abnormality. Joint or bursal effusion Joint effusion: No significant hip joint effusion. Bursae: No focal periarticular fluid collection. Muscles and tendons Muscles and tendons: The visualized sartorius, rectus femoris, gluteus, hamstring and iliopsoas tendons appear normal. Mild edema within the proximal right obturator externus and adductor brevis muscles adjacent to the pubic symphysis. Faint edema within the bilateral piriformis muscles. Mild bilateral gluteus muscle atrophy. Other findings Miscellaneous: Prior hysterectomy. Trace presacral fluid. Sigmoid diverticulosis. IMPRESSION: 1.  No acute osseous abnormality. 2. Mild edema within the proximal right obturator externus and adductor brevis muscles could reflect strain. These results were called  by telephone at the time of interpretation on 01/17/2018 at 5:00 pm to Dr. Verlon Au , who verbally acknowledged these results. Electronically Signed   By: Titus Dubin M.D.   On: 01/17/2018 17:14   Mr Knee Right Wo Contrast  Result Date: 01/19/2018 CLINICAL DATA:  Right knee pain and swelling. Two falls in the last 24 hours. EXAM: MRI OF THE RIGHT KNEE WITHOUT CONTRAST TECHNIQUE: Multiplanar, multisequence MR imaging of the knee was performed. No intravenous contrast was administered. COMPARISON:  Radiographs dated 01/15/2018 FINDINGS: MENISCI Medial meniscus:  Intact. Lateral meniscus: Intact but diffusely intrinsically degenerated with some fraying of the superior surface of the anterior horn. LIGAMENTS Cruciates:  Normal. Collaterals:  Normal. CARTILAGE Patellofemoral:  Normal. Medial:  Normal. Lateral: Tiny focal fissure in the posterior central aspect of the femoral condyle. Joint: Moderate joint effusion. Debris and synovial hypertrophy in the joint. Popliteal Fossa: Small Baker's cyst containing debris. Intact popliteus tendon. Extensor Mechanism:  Normal. Bones: Tiny nondisplaced subcortical fracture of the posterior margin of lateral tibial plateau best seen on images 7 and 8 of series 19. No other significant bone abnormality. Other: None IMPRESSION: 1. Tiny nondisplaced fracture of the posterior rim of the lateral tibial plateau. 2. Moderate joint effusion with some debris in the joint. 3. Small Baker's cyst containing debris. 4. Degenerative changes of the lateral meniscus as described. Electronically Signed   By: Lorriane Shire M.D.   On: 01/19/2018 11:47   Dg Chest Portable 1 View  Result Date: 01/12/2018 CLINICAL DATA:  Dialysis yesterday. Wheezing this afternoon. Chest discomfort. Bradycardia. Rales. EXAM: PORTABLE CHEST 1 VIEW COMPARISON:  12/21/2017 FINDINGS: Left central venous dialysis catheter with tip over the right atrium. No pneumothorax. Shallow inspiration with elevation of  right hemidiaphragm. Right perihilar infiltrates are new since previous study and may indicate developing pneumonia. Calcification of the aorta. No blunting of costophrenic angles. IMPRESSION: New development of right perihilar infiltrates suggesting developing pneumonia. Electronically Signed   By: Lucienne Capers M.D.   On: 01/12/2018 04:16   Dg Hips Bilat W Or Wo Pelvis 3-4 Views  Result Date: 01/15/2018 CLINICAL DATA:  Fall with bilateral leg pain EXAM: DG HIP (WITH OR WITHOUT PELVIS)  3-4V BILAT COMPARISON:  CT 08/20/2017 FINDINGS: SI joints are non widened. The pubic symphysis is intact. Possible acute minimally displaced fracture involving the right inferior pubic ramus. Both femoral heads project in joint. Vascular calcifications.  Stent in the right thigh. IMPRESSION: Possible acute minimally displaced fracture involving the right inferior pubic ramus. Otherwise no acute osseous abnormality is seen. Electronically Signed   By: Donavan Foil M.D.   On: 01/15/2018 18:38    Assessment/Plan 1.  Complication dialysis device with thrombosis AV access:  Patient's left arm dialysis access is thrombosed. The patient will undergo thrombectomy using interventional techniques.  The risks and benefits were described to the patient.  All questions were answered.  The patient agrees to proceed with angiography and intervention. Potassium will be drawn to ensure that it is an appropriate level prior to performing thrombectomy. 2.  End-stage renal disease requiring hemodialysis:  Patient will continue dialysis therapy without further interruption if a successful thrombectomy is not achieved then catheter will be placed. Dialysis has already been arranged since the patient missed their previous session 3.  Hypertension:  Patient will continue medical management; nephrology is following no changes in oral medications. 4.  PAD: s/p recent intervention with pain still present. 5.  Pelvic fractures and recent falls:  Recently discharged from the hospital from above issues.  Still with difficulty walking    Leotis Pain, MD  01/27/2018 12:30 PM

## 2018-01-28 DIAGNOSIS — N2581 Secondary hyperparathyroidism of renal origin: Secondary | ICD-10-CM | POA: Diagnosis not present

## 2018-01-28 DIAGNOSIS — D631 Anemia in chronic kidney disease: Secondary | ICD-10-CM | POA: Diagnosis not present

## 2018-01-28 DIAGNOSIS — Z992 Dependence on renal dialysis: Secondary | ICD-10-CM | POA: Diagnosis not present

## 2018-01-28 DIAGNOSIS — N186 End stage renal disease: Secondary | ICD-10-CM | POA: Diagnosis not present

## 2018-01-28 NOTE — ED Provider Notes (Signed)
[facility] EMERGENCY DEPARTMENT Provider Note   CSN: [id number] Arrival date & time:        History   Chief Complaint Chief Complaint  Patient presents with  . Ankle Pain    HPI [name] is a 77 y.o. female.  HPI 77 year old female who is brought to the emergency department with complaints of right ankle pain.  She was recently seen and was diagnosed with a pelvic fracture after a fall but states that her ankle was never evaluated.  She is had painful right ankle with ambulation since then and per the rehab facility refuses to bear weight.  Pain worse with range of motion.  Denies other new complaints.   Past Medical History:  Diagnosis Date  . Abnormality of gait 03/28/2015  . Adrenal mass ([place])   . 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 Arthritis   . Back pain   . CHF (congestive heart failure) ([place])   . Chronic kidney disease    Hemo MWF  . Congestion of throat    Pt states she has a lot mucus in back throat.  . Constipation   . COPD (chronic obstructive pulmonary disease) ([place])   . Depression   . Dialysis patient [facility])    kidney  . Diverticulitis   . GERD (gastroesophageal reflux disease)   . H/O hiatal hernia   . High cholesterol   . Hoarseness of voice   . Hyperlipidemia   . Hypertension   . IBS (irritable bowel syndrome)   . Meralgia paresthetica of right side 12/26/2014  . Normal cardiac stress test 12/24/2009   lexiscan, imaging normal  . PAD (peripheral artery disease) ([place]) 12/21/2017  . Pneumonia    [id number]  . Renal disorder   . RLS (restless legs syndrome)   . Seizures ([place])    2004 past brain surgery  . Sinus complaint   . Stroke ([place])    TIAs per patient 2 or 3  . Thyroid disease   . TIA (transient ischemic attack)   . Tubular adenoma of colon 01/2008    Patient Active Problem List   Diagnosis Date Noted  . Fall 01/16/2018  . Fracture of multiple pubic rami ([place]) 01/15/2018    . Closed fracture of multiple pubic rami, right, initial encounter ([place]) 01/15/2018  . History of DVT of lower extremity 01/12/2018  . Prolonged QT interval 01/12/2018  . Long term (current) use of anticoagulants 01/04/2018  . Right leg pain 12/21/2017  . PAD (peripheral artery disease) ([place]) 12/21/2017  . Leukocytosis 12/21/2017  . Hyperlipidemia 12/01/2017  . Atherosclerosis of native arteries of extremity with rest pain ([place]) 12/01/2017  . LLQ abdominal pain 08/10/2017  . Left bundle branch block 07/24/2017  . Chest pain 07/24/2017  . Weakness 12/23/2016  . Generalized weakness 12/22/2016  . HCAP (healthcare-associated pneumonia) 12/06/2016  . Hypokalemia 12/06/2016  . Chronic systolic CHF (congestive heart failure) ([place]) 12/06/2016  . Osteopenia 09/08/2016  . Allergic rhinitis 08/07/2016  . BMI 32.0-32.9,adult 08/07/2016  . Onychomycosis 05/09/2016  . Nonspecific chest pain 04/19/2016  . Leg pain, bilateral 12/05/2015  . Trouble in sleeping 11/22/2015  . End stage renal disease ([place])   . Acute on chronic respiratory failure with hypoxia ([place]) 11/12/2015  . ESRD on dialysis ([place])   . Chronic obstructive pulmonary disease ([place])   . Hypertensive urgency 09/20/2015  . Fecal incontinence   . Adverse effects of medication  06/21/2015  . Herpes 06/19/2015  . Skin lesion 06/16/2015  . Recurrent genital herpes 06/16/2015  . COPD exacerbation ([place]) 06/05/2015  . Onychocryptosis 04/08/2015  . Unstable gait 03/28/2015  . Renal mass   . Hypertension   . Inadequate pain control 12/30/2014  . CHF (congestive heart failure) ([place]) 12/30/2014  . Meralgia paresthetica of right side 12/26/2014  . Back pain 06/22/2014  . Hereditary and idiopathic peripheral neuropathy 02/15/2013  . Dermatitis of face 08/19/2012  . HIP PAIN, BILATERAL 12/07/2008  . BLADDER PROLAPSE 11/17/2007  . OSTEOARTHRITIS, GENERALIZED, MULTIPLE JOINTS 08/18/2007  . 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 Anxiety and depression 04/23/2007  . Anemia of chronic disease 03/31/2007  . TOBACCO ABUSE 12/10/2006  . HIATAL HERNIA 12/10/2006  . Dysphagia 12/10/2006  . HYPERCHOLESTEROLEMIA 07/30/2006  . Gout, unspecified 07/30/2006  . 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 Hypertensive renal disease, malignant, with renal failure 07/30/2006  . GASTROESOPHAGEAL REFLUX, NO ESOPHAGITIS 07/30/2006    Past Surgical History:  Procedure Laterality Date  . A/V SHUNT INTERVENTION N/A 08/13/2017   Procedure: A/V SHUNT INTERVENTION;  Surgeon: Algernon Huxley, MD;  Location: [facility];  Service: Cardiovascular;  Laterality: N/A;  . A/V SHUNTOGRAM Left 08/13/2017   Procedure: A/V SHUNTOGRAM;  Surgeon: Algernon Huxley, MD;  Location: [facility];  Service: Cardiovascular;  Laterality: Left;  . ABDOMINAL HYSTERECTOMY    . ANAL RECTAL MANOMETRY N/A 07/25/2015   Procedure: ANO RECTAL MANOMETRY;  Surgeon: Mauri Pole, MD;  Location: [facility];  Service: Endoscopy;  Laterality: N/A;  . APPENDECTOMY    . AV FISTULA PLACEMENT Left 11/11/2012   Procedure: INSERTION OF ARTERIOVENOUS (AV) GORE-TEX GRAFT ARM;  Surgeon: Angelia Mould, MD;  Location: [facility];  Service: Vascular;  Laterality: Left;  . [place] REMOVAL Left 11/18/2012   Procedure: REMOVAL OF LEFT UPPER ARM ARTERIOVENOUS GORETEX GRAFT ([place]);  Surgeon: Angelia Mould, MD;  Location: [facility];  Service: Vascular;  Laterality: Left;  . CARDIAC CATHETERIZATION  2003   normal  . CHOLECYSTECTOMY     Open mid line incision  . frontal craniotomy  2002   indication = sinusitis  . INSERTION OF DIALYSIS CATHETER     Left  . LOWER EXTREMITY ANGIOGRAPHY Right 12/17/2017   Procedure: LOWER EXTREMITY ANGIOGRAPHY;  Surgeon: Algernon Huxley, MD;  Location: [facility];  Service: Cardiovascular;  Laterality: Right;  . NASAL SINUS SURGERY Bilateral 2002  . PATCH ANGIOPLASTY Left 11/18/2012   Procedure: PATCH  ANGIOPLASTY;  Surgeon: Angelia Mould, MD;  Location: [facility];  Service: Vascular;  Laterality: Left;  . PERIPHERAL VASCULAR CATHETERIZATION Left 03/08/2015   Procedure: A/V Shuntogram/Fistulagram;  Surgeon: Algernon Huxley, MD;  Location: [facility];  Service: Cardiovascular;  Laterality: Left;  . PERIPHERAL VASCULAR CATHETERIZATION Left 03/08/2015   Procedure: A/V Shunt Intervention;  Surgeon: Algernon Huxley, MD;  Location: [facility];  Service: Cardiovascular;  Laterality: Left;  . PERIPHERAL VASCULAR THROMBECTOMY Left 01/27/2018   Procedure: PERIPHERAL VASCULAR THROMBECTOMY;  Surgeon: Algernon Huxley, MD;  Location: [facility];  Service: Cardiovascular;  Laterality: Left;  . RECTAL ULTRASOUND N/A 10/05/2015   Procedure: ANAL ULTRASOUND WITH PROBE;  Surgeon: Leighton Ruff, MD;  [facility];  Service: Endoscopy;  Laterality: N/A;  . REVISON OF ARTERIOVENOUS FISTULA  05/11/2012   Procedure: REVISON OF ARTERIOVENOUS FISTULA;  Surgeon: Elam Dutch, MD;  Location: [facility];  Service: Vascular;  Laterality: Right;  . TUBAL LIGATION       OB History    Gravida  5   Para  3   Term  3   Preterm      AB      Living        SAB      TAB      Ectopic      Multiple      Live Births               Home Medications    Prior to Admission medications   Medication Sig Start Date End Date Taking? Authorizing Provider  albuterol (PROVENTIL HFA;VENTOLIN HFA) 108 (90 Base) MCG/ACT inhaler Inhale 2 puffs into the lungs every 4 (four) hours as needed for wheezing or shortness of breath. 03/11/17   [place]ique, Betty G, MD  allopurinol (ZYLOPRIM) 100 MG tablet Take 100 mg by mouth once daily on non-dialysis days (Tues/Thurs/Sat/Sun) Patient taking differently: Take 100 mg by mouth See admin instructions. Take one tablet (100 mg) by mouth once daily on non-dialysis days (Tues/Thurs/Sat/Sun) 11/13/15   Smiley Houseman, MD  amLODipine (NORVASC) 10 MG tablet  TAKE 1 TABLET BY MOUTH AT BEDTIME Patient taking differently: Take 10 mg by mouth at bedtime.  06/01/17   [place]ique, Betty G, MD  atorvastatin (LIPITOR) 40 MG tablet Take 1 tablet (40 mg total) by mouth daily. Patient taking differently: Take 40 mg by mouth at bedtime.  02/26/15   Rumley, Seneca N, DO  azelastine (ASTELIN) 0.1 % nasal spray Place 1 spray into both nostrils 2 (two) times daily. Use in each nostril as directed Patient taking differently: Place 1 spray into both nostrils 2 (two) times daily as needed for rhinitis or allergies. Use in each nostril as directed 08/07/16   [place]ique, Betty G, MD  B Complex-C-Zn-Folic Acid (DIALYVITE/ZINC) TABS Take 1 tablet by mouth daily. 09/25/17   [provider]  budesonide-formoterol (SYMBICORT) 160-4.5 MCG/ACT inhaler Inhale 2 puffs into the lungs 2 (two) times daily. 07/26/17   Dana Allan I, MD  cinacalcet (SENSIPAR) 90 MG tablet Take 90 mg by mouth at bedtime.    [provider]  clopidogrel (PLAVIX) 75 MG tablet Take 1 tablet (75 mg total) by mouth daily. 01/21/18   Purohit, Konrad Dolores, MD  DULoxetine (CYMBALTA) 30 MG capsule Take 2 capsules (60 mg total) by mouth daily. Patient taking differently: Take 60 mg by mouth at bedtime.  01/08/17   Kathrynn Ducking, MD  fluticasone [facility]) 50 MCG/ACT nasal spray Place 2 sprays into both nostrils daily as needed for allergies or rhinitis. 10/29/17   [place], Betty G, MD  glycopyrrolate (ROBINUL) 2 MG tablet Take 1 tablet (2 mg total) by mouth 2 (two) times daily. 08/25/17   Ladene Artist, MD  hydrOXYzine (ATARAX/VISTARIL) 25 MG tablet Take 1 tablet (25 mg total) by mouth every 8 (eight) hours as needed for itching. 09/16/17   [place], Betty G, MD  lidocaine-prilocaine (EMLA) cream Apply 1 application topically See admin instructions. Apply topically one hour before dialysis - Monday, Wednesday, Friday    [provider]  loratadine (CLARITIN) 10 MG tablet 10 mg every other day. Patient  taking differently: Take 10 mg by mouth daily as needed for allergies. 10 mg every other day. 10/29/17   [place], Betty G, MD  losartan (COZAAR) 100 MG tablet Take 1 tablet (100 mg total) by mouth daily. 10/29/17   [place], Betty G, MD  metoprolol succinate (TOPROL-XL) 100 MG 24 hr tablet TAKE 1 TABLET (100 MG)  BY MOUTH TWICE DAILY Patient taking differently: Take 100 mg by mouth 2 (two) times daily.  10/13/17   [place], Betty G, MD  multivitamin (RENA-VIT) TABS tablet Take 1 tablet by mouth at bedtime. Patient taking differently: Take 1 tablet by mouth daily.  01/01/15   Haney, Amedeo Plenty, MD  oxyCODONE-acetaminophen (PERCOCET) 5-325 MG tablet Take 1 tablet by mouth every 8 (eight) hours as needed for severe pain. Patient taking differently: Take 0.5-1 tablets by mouth every 8 (eight) hours as needed for severe pain.  01/05/18   [place], Betty G, MD  polyethylene glycol powder (MIRALAX) powder Take 17 g by mouth daily. 12/29/17   Jola Schmidt, MD  pregabalin (LYRICA) 100 MG capsule Take 100 mg by mouth at bedtime.     [provider]  senna-docusate (SENOKOT-S) 8.6-50 MG tablet Take 1 tablet by mouth 2 (two) times daily. 12/27/17   Georgette Shell, MD  sevelamer carbonate (RENVELA) 800 MG tablet Take 2,400 mg by mouth See admin instructions. Take 3 tablets (2400 mg) by mouth with each meal or snack - once or twice daily    [provider]    Family History Family History  Problem Relation Age of Onset  . Thyroid cancer Mother   . Heart disease Father   . Hypertension Father   . Heart attack Father   . Lupus Daughter   . Breast cancer Sister   . Thyroid cancer Sister   . Kidney disease Sister   . Colon cancer Neg Hx     Social History Social History   Tobacco Use  . Smoking status: Former Smoker    Packs/day: 1.00    Years: 60.00    Pack years: 60.00    Types: Cigarettes    Last attempt to quit: 12/17/2017    Years since quitting: 0.1  . Smokeless tobacco: Never Used    . Tobacco comment: smoker since 77 yo.  1.5 ppd of [place] 100 lights. ; form given 10-23-16  Substance Use Topics  . Alcohol use: No    Alcohol/week: 0.0 standard drinks  . Drug use: No    Comment: No hx of IV drug use     Allergies   Tuberculin tests; Valacyclovir; Codeine; Penicillins; and Sulfamethoxazole   Review of Systems Review of Systems  All other systems reviewed and are negative.    Physical Exam Updated Vital Signs BP (!) 153/45 (BP Location: Right Arm)   Pulse 77   Temp 97.9 F (36.6 C) (Oral)   Resp 12   Ht 5\' 1"  (1.549 m)   Wt 68.5 kg   SpO2 100%   BMI 28.53 kg/m   Physical Exam  Constitutional: She is oriented to person, place, and time. She appears well-developed and well-nourished.  HENT:  Head: Normocephalic.  Eyes: EOM are normal.  Neck: Normal range of motion.  Pulmonary/Chest: Effort normal.  Abdominal: She exhibits no distension.  Musculoskeletal: Normal range of motion.  Full range of motion bilateral hips and knees.  Mild tenderness of the right ankle with mild associated swelling.  Tenderness overlying both medial and lateral malleolus.  Normal PT and DP pulse in the right foot.  Compartments of the right lower extremity and right foot are soft  Neurological: She is alert and oriented to person, place, and time.  Psychiatric: She has a normal mood and affect.  Nursing note and vitals reviewed.  ED Treatments / Results  Labs (all labs ordered are listed, but only abnormal results are displayed) Labs Reviewed - No data to display  EKG None  Radiology Right ankle x-ray: n Slight displaced oblique fracture of the distal fibula noted. Slight displaced fracture of the distal tip of the medial malleolus. No other focal abnormalities identified. Diffuse osteopenia degenerative change.  IMPRESSION: 1. Slight displaced oblique fracture of the distal fibula. Slight displaced fracture of the distal tip of the medial malleolus.   I  personally reviewed the patient's x-ray   Procedures .Splint Application Performed by: Jola Schmidt, MD Authorized by: Jola Schmidt, MD    SPLINT APPLICATION Authorized by: Jola Schmidt Consent: Verbal consent obtained. Risks and benefits: risks, benefits and alternatives were discussed Consent given by: patient Splint applied by: orthopedic technician Location details: right lower extremity Splint type: short leg Supplies used: orthoglass Post-procedure: The splinted body part was neurovascularly unchanged following the procedure. Patient tolerance: Patient tolerated the procedure well with no immediate complications.     Medications Ordered in ED Medications - No data to display   Initial Impression / Assessment and Plan / ED Course  I have reviewed the triage vital signs and the nursing notes.  Pertinent labs & imaging results that were available during my care of the patient were reviewed by me and considered in my medical decision making (see chart for details).     Right ankle fracture.  Placed in a short leg splint.  Nonweightbearing status.  Return to the rehab facility.  Outpatient orthopedic follow-up.  Final Clinical Impressions(s) / ED Diagnoses   Final diagnoses:  Closed right ankle fracture, initial encounter    ED Discharge Orders    None       Jola Schmidt, MD 01/28/18 [id number]

## 2018-01-29 DIAGNOSIS — D631 Anemia in chronic kidney disease: Secondary | ICD-10-CM | POA: Diagnosis not present

## 2018-01-29 DIAGNOSIS — N2581 Secondary hyperparathyroidism of renal origin: Secondary | ICD-10-CM | POA: Diagnosis not present

## 2018-01-29 DIAGNOSIS — N186 End stage renal disease: Secondary | ICD-10-CM | POA: Diagnosis not present

## 2018-01-29 DIAGNOSIS — G4700 Insomnia, unspecified: Secondary | ICD-10-CM | POA: Diagnosis not present

## 2018-01-29 DIAGNOSIS — F329 Major depressive disorder, single episode, unspecified: Secondary | ICD-10-CM | POA: Diagnosis not present

## 2018-01-29 DIAGNOSIS — R197 Diarrhea, unspecified: Secondary | ICD-10-CM | POA: Diagnosis not present

## 2018-01-29 DIAGNOSIS — Z992 Dependence on renal dialysis: Secondary | ICD-10-CM | POA: Diagnosis not present

## 2018-02-01 DIAGNOSIS — N2581 Secondary hyperparathyroidism of renal origin: Secondary | ICD-10-CM | POA: Diagnosis not present

## 2018-02-01 DIAGNOSIS — N186 End stage renal disease: Secondary | ICD-10-CM | POA: Diagnosis not present

## 2018-02-02 ENCOUNTER — Other Ambulatory Visit: Payer: Self-pay | Admitting: *Deleted

## 2018-02-02 ENCOUNTER — Telehealth: Payer: Self-pay | Admitting: Family Medicine

## 2018-02-02 DIAGNOSIS — R0781 Pleurodynia: Secondary | ICD-10-CM | POA: Diagnosis not present

## 2018-02-02 DIAGNOSIS — M25571 Pain in right ankle and joints of right foot: Secondary | ICD-10-CM | POA: Diagnosis not present

## 2018-02-02 DIAGNOSIS — S82891A Other fracture of right lower leg, initial encounter for closed fracture: Secondary | ICD-10-CM

## 2018-02-02 DIAGNOSIS — K219 Gastro-esophageal reflux disease without esophagitis: Secondary | ICD-10-CM | POA: Diagnosis not present

## 2018-02-02 DIAGNOSIS — R0981 Nasal congestion: Secondary | ICD-10-CM | POA: Diagnosis not present

## 2018-02-02 NOTE — Telephone Encounter (Signed)
Copied from [place] [id number]. Topic: Referral - Request >> Feb 02, 2018  2:06 PM Oliver Pila B wrote: Reason for CRM: [facility] called to get a referral sent to them so the pt can be seen at the facility; Dr. Renda Rolls is the doctor; pt is being seen for a right ankle  Fax: [phone] >> Feb 02, 2018  2:17 PM Tye [place] wrote: Phone: [phone] Percell Miller wainer) call office when sent

## 2018-02-02 NOTE — Telephone Encounter (Signed)
Referral placed and copy faxed to [facility] as requested. Called office and informed them that fax was sent.

## 2018-02-03 DIAGNOSIS — N186 End stage renal disease: Secondary | ICD-10-CM | POA: Diagnosis not present

## 2018-02-03 DIAGNOSIS — N2581 Secondary hyperparathyroidism of renal origin: Secondary | ICD-10-CM | POA: Diagnosis not present

## 2018-02-05 ENCOUNTER — Ambulatory Visit: Payer: Self-pay | Admitting: General Practice

## 2018-02-05 ENCOUNTER — Emergency Department (HOSPITAL_COMMUNITY)
Admission: EM | Admit: 2018-02-05 | Discharge: 2018-02-06 | Disposition: A | Discharge status: Home / Self Care | Payer: Medicare Other | Attending: Emergency Medicine | Admitting: Emergency Medicine

## 2018-02-05 ENCOUNTER — Emergency Department (HOSPITAL_COMMUNITY): Payer: Medicare Other

## 2018-02-05 ENCOUNTER — Other Ambulatory Visit: Payer: Self-pay

## 2018-02-05 DIAGNOSIS — I132 Hypertensive heart and chronic kidney disease with heart failure and with stage 5 chronic kidney disease, or end stage renal disease: Secondary | ICD-10-CM | POA: Insufficient documentation

## 2018-02-05 DIAGNOSIS — J449 Chronic obstructive pulmonary disease, unspecified: Secondary | ICD-10-CM | POA: Diagnosis not present

## 2018-02-05 DIAGNOSIS — E079 Disorder of thyroid, unspecified: Secondary | ICD-10-CM | POA: Insufficient documentation

## 2018-02-05 DIAGNOSIS — I5022 Chronic systolic (congestive) heart failure: Secondary | ICD-10-CM | POA: Insufficient documentation

## 2018-02-05 DIAGNOSIS — R072 Precordial pain: Secondary | ICD-10-CM | POA: Insufficient documentation

## 2018-02-05 DIAGNOSIS — K5900 Constipation, unspecified: Secondary | ICD-10-CM | POA: Insufficient documentation

## 2018-02-05 DIAGNOSIS — J9811 Atelectasis: Secondary | ICD-10-CM | POA: Diagnosis not present

## 2018-02-05 DIAGNOSIS — Z992 Dependence on renal dialysis: Secondary | ICD-10-CM | POA: Insufficient documentation

## 2018-02-05 DIAGNOSIS — N186 End stage renal disease: Secondary | ICD-10-CM | POA: Diagnosis not present

## 2018-02-05 DIAGNOSIS — R1032 Left lower quadrant pain: Secondary | ICD-10-CM | POA: Diagnosis not present

## 2018-02-05 DIAGNOSIS — Z87891 Personal history of nicotine dependence: Secondary | ICD-10-CM | POA: Insufficient documentation

## 2018-02-05 DIAGNOSIS — N2581 Secondary hyperparathyroidism of renal origin: Secondary | ICD-10-CM | POA: Diagnosis not present

## 2018-02-05 LAB — BASIC METABOLIC PANEL
Anion gap: 17 — ABNORMAL HIGH (ref 5–15)
BUN: 9 mg/dL (ref 8–23)
CO2: 24 mmol/L (ref 22–32)
Calcium: 8.4 mg/dL — ABNORMAL LOW (ref 8.9–10.3)
Chloride: 97 mmol/L — ABNORMAL LOW (ref 98–111)
Creatinine, Ser: 4.02 mg/dL — ABNORMAL HIGH (ref 0.44–1.00)
GFR calc Af Amer: 11 mL/min — ABNORMAL LOW (ref >60)
GFR, EST NON AFRICAN AMERICAN: 10 mL/min — AB (ref >60)
GLUCOSE: 66 mg/dL — AB (ref 70–99)
POTASSIUM: 4.1 mmol/L (ref 3.5–5.1)
Sodium: 138 mmol/L (ref 135–145)

## 2018-02-05 LAB — CBC
HEMATOCRIT: 35.4 % — AB (ref 36.0–46.0)
Hemoglobin: 11 g/dL — ABNORMAL LOW (ref 12.0–15.0)
MCH: 28.2 pg (ref 26.0–34.0)
MCHC: 31.1 g/dL (ref 30.0–36.0)
MCV: 90.8 fL (ref 78.0–100.0)
Platelets: 215 10*3/uL (ref 150–400)
RBC: 3.9 MIL/uL (ref 3.87–5.11)
RDW: 18.6 % — AB (ref 11.5–15.5)
WBC: 10.1 10*3/uL (ref 4.0–10.5)

## 2018-02-05 LAB — HEPATIC FUNCTION PANEL
ALK PHOS: 88 U/L (ref 38–126)
ALT: 13 U/L (ref 0–44)
AST: 26 U/L (ref 15–41)
Albumin: 2.3 g/dL — ABNORMAL LOW (ref 3.5–5.0)
BILIRUBIN DIRECT: 0.2 mg/dL (ref 0.0–0.2)
BILIRUBIN INDIRECT: 0.8 mg/dL (ref 0.3–0.9)
Total Bilirubin: 1 mg/dL (ref 0.3–1.2)
Total Protein: 6.7 g/dL (ref 6.5–8.1)

## 2018-02-05 LAB — I-STAT TROPONIN, ED: Troponin i, poc: 0.06 ng/mL (ref 0.00–0.08)

## 2018-02-05 LAB — LIPASE, BLOOD: Lipase: 25 U/L (ref 11–51)

## 2018-02-05 MED ORDER — MORPHINE SULFATE (PF) 4 MG/ML IV SOLN
4.0000 mg | Freq: Once | INTRAVENOUS | Status: AC
  Administered 2018-02-05: 4 mg via INTRAVENOUS
  Filled 2018-02-05: qty 1

## 2018-02-05 NOTE — ED Notes (Signed)
Attempted IV access in R hand without success.

## 2018-02-05 NOTE — ED Provider Notes (Signed)
Emergency Department Provider Note   I have reviewed the triage vital signs and the nursing notes.   HISTORY  Chief Complaint Chest Pain and Abdominal Pain   HPI [name] is a 77 y.o. female with PMH  Of CHF, ESRD, HLD, HTN, and TIA presents to the emergency department for evaluation of chest and abdominal pain.  The patient's abdominal pain is her primary reason for presentation.  She went to dialysis today and had a full treatment.  Upon returning home she had abdominal pain worse in the left lower quadrant.  She states it felt similar to her diverticulitis but this was apparently not on her list of medical issues at her rehab center.  They would not provide treatment and send her to the emergency department.   The patient is also experiencing left chest discomfort for the past week.  She is had similar chest pains in the past.  Pain is been constant and nonradiating.  Patient is concerned that she may have developed pneumonia but is not experiencing any fever, chills, productive cough.   Past Medical History:  Diagnosis Date  . Abnormality of gait 03/28/2015  . Adrenal mass ([place])   . 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 Arthritis   . Back pain   . CHF (congestive heart failure) ([place])   . Chronic kidney disease    [place] MWF  . Congestion of throat    Pt states she has a lot mucus in back throat.  . Constipation   . COPD (chronic obstructive pulmonary disease) ([place])   . Depression   . Dialysis patient [facility])    kidney  . Diverticulitis   . GERD (gastroesophageal reflux disease)   . H/O hiatal hernia   . High cholesterol   . Hoarseness of voice   . Hyperlipidemia   . Hypertension   . IBS (irritable bowel syndrome)   . Meralgia paresthetica of right side 12/26/2014  . Normal cardiac stress test 12/24/2009   lexiscan, imaging normal  . PAD (peripheral artery disease) ([place]) 12/21/2017  . Pneumonia    [id number]  . Renal disorder     . RLS (restless legs syndrome)   . Seizures ([place])    2004 past brain surgery  . Sinus complaint   . Stroke ([place])    TIAs per patient 2 or 3  . Thyroid disease   . TIA (transient ischemic attack)   . Tubular adenoma of colon 01/2008    Patient Active Problem List   Diagnosis Date Noted  . Fall 01/16/2018  . Fracture of multiple pubic rami ([place]) 01/15/2018  . Closed fracture of multiple pubic rami, right, initial encounter ([place]) 01/15/2018  . History of DVT of lower extremity 01/12/2018  . Prolonged QT interval 01/12/2018  . Right leg pain 12/21/2017  . PAD (peripheral artery disease) ([place]) 12/21/2017  . Leukocytosis 12/21/2017  . Hyperlipidemia 12/01/2017  . Atherosclerosis of native arteries of extremity with rest pain ([place]) 12/01/2017  . LLQ abdominal pain 08/10/2017  . Left bundle branch block 07/24/2017  . Chest pain 07/24/2017  . Weakness 12/23/2016  . Generalized weakness 12/22/2016  . HCAP (healthcare-associated pneumonia) 12/06/2016  . Hypokalemia 12/06/2016  . Chronic systolic CHF (congestive heart failure) ([place]) 12/06/2016  . Osteopenia 09/08/2016  . Allergic rhinitis 08/07/2016  . BMI 32.0-32.9,adult 08/07/2016  . Onychomycosis 05/09/2016  . Nonspecific chest pain 04/19/2016  . Leg pain, bilateral 12/05/2015  .  Trouble in sleeping 11/22/2015  . End stage renal disease ([place])   . Acute on chronic respiratory failure with hypoxia ([place]) 11/12/2015  . ESRD on dialysis ([place])   . Chronic obstructive pulmonary disease ([place])   . Hypertensive urgency 09/20/2015  . Fecal incontinence   . Adverse effects of medication 06/21/2015  . Herpes 06/19/2015  . Skin lesion 06/16/2015  . Recurrent genital herpes 06/16/2015  . COPD exacerbation ([place]) 06/05/2015  . Onychocryptosis 04/08/2015  . Unstable gait 03/28/2015  . Renal mass   . Hypertension   . Inadequate pain control 12/30/2014  . CHF (congestive heart failure) ([place]) 12/30/2014  . Meralgia paresthetica of right  side 12/26/2014  . Back pain 06/22/2014  . Hereditary and idiopathic peripheral neuropathy 02/15/2013  . Dermatitis of face 08/19/2012  . HIP PAIN, BILATERAL 12/07/2008  . BLADDER PROLAPSE 11/17/2007  . OSTEOARTHRITIS, GENERALIZED, MULTIPLE JOINTS 08/18/2007  . 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 Anxiety and depression 04/23/2007  . Anemia of chronic disease 03/31/2007  . TOBACCO ABUSE 12/10/2006  . HIATAL HERNIA 12/10/2006  . Dysphagia 12/10/2006  . HYPERCHOLESTEROLEMIA 07/30/2006  . Gout, unspecified 07/30/2006  . Major depression in partial remission ([place]) 07/30/2006  . Hypertensive renal disease, malignant, with renal failure 07/30/2006  . GASTROESOPHAGEAL REFLUX, NO ESOPHAGITIS 07/30/2006    Past Surgical History:  Procedure Laterality Date  . A/V SHUNT INTERVENTION N/A 08/13/2017   Procedure: A/V SHUNT INTERVENTION;  Surgeon: Algernon Huxley, MD;  Location: [facility];  Service: Cardiovascular;  Laterality: N/A;  . A/V SHUNTOGRAM Left 08/13/2017   Procedure: A/V SHUNTOGRAM;  Surgeon: Algernon Huxley, MD;  Location: [facility];  Service: Cardiovascular;  Laterality: Left;  . ABDOMINAL HYSTERECTOMY    . ANAL RECTAL MANOMETRY N/A 07/25/2015   Procedure: ANO RECTAL MANOMETRY;  Surgeon: Mauri Pole, MD;  Location: [facility];  Service: Endoscopy;  Laterality: N/A;  . APPENDECTOMY    . AV FISTULA PLACEMENT Left 11/11/2012   Procedure: INSERTION OF ARTERIOVENOUS (AV) GORE-TEX GRAFT ARM;  Surgeon: Angelia Mould, MD;  Location: [facility];  Service: Vascular;  Laterality: Left;  . [place] REMOVAL Left 11/18/2012   Procedure: REMOVAL OF LEFT UPPER ARM ARTERIOVENOUS GORETEX GRAFT (Portal);  Surgeon: Angelia Mould, MD;  Location: [place];  Service: Vascular;  Laterality: Left;  . CARDIAC CATHETERIZATION  2003   normal  . CHOLECYSTECTOMY     Open mid line incision  . frontal craniotomy  2002   indication = sinusitis  . INSERTION OF DIALYSIS  CATHETER     Left  . LOWER EXTREMITY ANGIOGRAPHY Right 12/17/2017   Procedure: LOWER EXTREMITY ANGIOGRAPHY;  Surgeon: Algernon Huxley, MD;  Location: [facility];  Service: Cardiovascular;  Laterality: Right;  . NASAL SINUS SURGERY Bilateral 2002  . PATCH ANGIOPLASTY Left 11/18/2012   Procedure: PATCH ANGIOPLASTY;  Surgeon: Angelia Mould, MD;  Location: [facility];  Service: Vascular;  Laterality: Left;  . PERIPHERAL VASCULAR CATHETERIZATION Left 03/08/2015   Procedure: A/V Shuntogram/Fistulagram;  Surgeon: Algernon Huxley, MD;  Location: [place] [facility];  Service: Cardiovascular;  Laterality: Left;  . PERIPHERAL VASCULAR CATHETERIZATION Left 03/08/2015   Procedure: A/V Shunt Intervention;  Surgeon: Algernon Huxley, MD;  Location: [facility];  Service: Cardiovascular;  Laterality: Left;  . PERIPHERAL VASCULAR THROMBECTOMY Left 01/27/2018   Procedure: PERIPHERAL VASCULAR THROMBECTOMY;  Surgeon: Algernon Huxley, MD;  Location: [facility];  Service: Cardiovascular;  Laterality: Left;  .  RECTAL ULTRASOUND N/A 10/05/2015   Procedure: ANAL ULTRASOUND WITH PROBE;  Surgeon: Leighton Ruff, MD;  Location: [facility];  Service: Endoscopy;  Laterality: N/A;  . REVISON OF ARTERIOVENOUS FISTULA  05/11/2012   Procedure: REVISON OF ARTERIOVENOUS FISTULA;  Surgeon: Elam Dutch, MD;  Location: [facility];  Service: Vascular;  Laterality: Right;  . TUBAL LIGATION      Allergies Tuberculin tests; Valacyclovir; Codeine; Penicillins; and Sulfamethoxazole  Family History  Problem Relation Age of Onset  . Thyroid cancer Mother   . Heart disease Father   . Hypertension Father   . Heart attack Father   . Lupus Daughter   . Breast cancer Sister   . Thyroid cancer Sister   . Kidney disease Sister   . Colon cancer Neg Hx     Social History Social History   Tobacco Use  . Smoking status: Former Smoker    Packs/day: 1.00    Years: 60.00    Pack years: 60.00    Types: Cigarettes     Last attempt to quit: 12/17/2017    Years since quitting: 0.1  . Smokeless tobacco: Never Used  . Tobacco comment: smoker since 77 yo.  1.5 ppd of [place] 100 lights. ; form given 10-23-16  Substance Use Topics  . Alcohol use: No    Alcohol/week: 0.0 standard drinks  . Drug use: No    Comment: No hx of IV drug use    Review of Systems  Constitutional: No fever/chills Eyes: No visual changes. ENT: No sore throat. Cardiovascular: Positive chest pain. Respiratory: Denies shortness of breath. Gastrointestinal: Positive LLQ abdominal pain.  No nausea, no vomiting.  No diarrhea.  No constipation. Genitourinary: Patient does not make urine.  Musculoskeletal: Negative for back pain. Skin: Negative for rash. Neurological: Negative for headaches, focal weakness or numbness.  10-point ROS otherwise negative.  ____________________________________________   PHYSICAL EXAM:  VITAL SIGNS: ED Triage Vitals  Enc Vitals Group     BP 02/05/18 2152 (!) 132/37     Pulse Rate 02/05/18 2215 (!) 109     Resp 02/05/18 2152 (!) 21     Temp --      Temp src --      SpO2 02/05/18 2215 100 %     Pain Score 02/05/18 2155 8   Constitutional: Alert and oriented. Well appearing and in no acute distress. Eyes: Conjunctivae are normal.  Head: Atraumatic. Nose: No congestion/rhinnorhea. Mouth/Throat: Mucous membranes are moist. Neck: No stridor.   Cardiovascular: Tachycardia. Good peripheral circulation. Grossly normal heart sounds.   Respiratory: Normal respiratory effort.  No retractions. Lungs CTAB. Gastrointestinal: Soft and nontender. No distention.  Musculoskeletal: No lower extremity tenderness nor edema. No gross deformities of extremities. Neurologic:  Normal speech and language. No gross focal neurologic deficits are appreciated.  Skin:  Skin is warm, dry and intact. No rash noted.  ____________________________________________   LABS (all labs ordered are listed, but only abnormal results  are displayed)  Labs Reviewed  BASIC METABOLIC PANEL - Abnormal; Notable for the following components:      Result Value   Chloride 97 (*)    Glucose, Bld 66 (*)    Creatinine, Ser 4.02 (*)    Calcium 8.4 (*)    GFR calc non Af Amer 10 (*)    GFR calc Af Amer 11 (*)    Anion gap 17 (*)    All other components within normal limits  CBC - Abnormal; Notable for the following components:  Hemoglobin 11.0 (*)    HCT 35.4 (*)    RDW 18.6 (*)    All other components within normal limits  HEPATIC FUNCTION PANEL - Abnormal; Notable for the following components:   Albumin 2.3 (*)    All other components within normal limits  LIPASE, BLOOD  I-STAT TROPONIN, ED   ____________________________________________  EKG   EKG Interpretation  Date/Time:  Friday February 05 2018 20:58:03 EDT Ventricular Rate:  105 PR Interval:    QRS Duration: 103 QT Interval:  383 QTC Calculation: 507 R Axis:   10 Text Interpretation:  Sinus tachycardia Nonspecific repol abnormality, diffuse leads Prolonged QT interval No STEMI.  Similar ST changes from prior.  Confirmed by Nanda Quinton [phone]) on 02/05/2018 10:08:27 PM       ____________________________________________  RADIOLOGY  Dg Chest 2 View  Result Date: 02/05/2018 CLINICAL DATA:  Chest and abdomen pain following dialysis today. EXAM: CHEST - 2 VIEW COMPARISON:  01/15/2018. FINDINGS: Normal sized heart. Tortuous and partially calcified thoracic aorta. Stable left jugular catheter with its tip in the inferior right atrium near the inferior cavoatrial junction. Interval curvilinear density and linear density at the right lung base and minimal linear density in the left mid to lower lung zone. Otherwise, clear lungs. Thoracic spine degenerative changes. IMPRESSION: Interval small amount of linear atelectasis at the right lung base and minimal linear atelectasis in the left mid to lower lung zone. Otherwise, unremarkable examination. Electronically  Signed   By: Claudie Revering M.D.   On: 02/05/2018 21:45    ____________________________________________   PROCEDURES  Procedure(s) performed:   Procedures  None ____________________________________________   INITIAL IMPRESSION / ASSESSMENT AND PLAN / ED COURSE  Pertinent labs & imaging results that were available during my care of the patient were reviewed by me and considered in my medical decision making (see chart for details).  Patient presents to the emergency department primarily with abdominal pain.  She has tenderness in the left lower quadrant which began after dialysis.  She has a history of diverticulitis and states this feels similar.  CT imaging of the abdomen without contrast pending but I am having the patient drink oral contrast.  Her chest pain is been going on for 1 week and is similar to chest pain that she is had in the past.  Chest x-ray reviewed with no acute findings.  Troponin is negative. Doubt PE.   Labs and CXR reviewed. CT pending. Care transferred to Dr. Roxanne Mins. Anticipate discharge if CT is negative.   ____________________________________________  FINAL CLINICAL IMPRESSION(S) / ED DIAGNOSES  Final diagnoses:  Left lower quadrant pain  Precordial chest pain     MEDICATIONS GIVEN DURING THIS VISIT:  Medications  morphine 4 MG/ML injection 4 mg (4 mg Intravenous Given 02/05/18 2156)    Note:  This document was prepared using Dragon voice recognition software and may include unintentional dictation errors.  Nanda Quinton, MD Emergency Medicine    Long, [facility], MD 02/06/18 [place]

## 2018-02-05 NOTE — ED Triage Notes (Signed)
Pt reports chest pain and abdominal pain worse with inspiration and palpation starting at 1530 after dialysis treatment. Pt residing at [facility] for recovery after a fall.  MWF diaylsis schedule.

## 2018-02-05 NOTE — ED Notes (Signed)
Pt in XR. 

## 2018-02-05 NOTE — ED Notes (Signed)
Family member Joseph Art [phone] - please call with any changes in condition/plan of care

## 2018-02-06 ENCOUNTER — Emergency Department (HOSPITAL_COMMUNITY): Payer: Medicare Other

## 2018-02-06 MED ORDER — MAGNESIUM CITRATE PO SOLN
1.0000 | Freq: Once | ORAL | Status: AC
  Administered 2018-02-06: 1 via ORAL
  Filled 2018-02-06: qty 296

## 2018-02-06 MED ORDER — PEG 3350-KCL-NABCB-NACL-NASULF 236 G PO SOLR: 4.0000 L | Freq: Once | ORAL | 0 refills | Status: AC

## 2018-02-06 NOTE — ED Provider Notes (Signed)
12:13 AM Care assumed from Dr. Laverta Baltimore, patient with abdominal pain pending CT of abdomen and pelvis.  Also with chest pain which has already been completely evaluated and felt to be noncardiac.  1:56 AM CT scan is significant for constipation with possible fecal impaction.  Patient was reexamined and there is very mild epigastric tenderness, no rebound or guarding.  She is felt to be safe for discharge.  She is given a dose of magnesium citrate in the emergency department and given a prescription for polyethylene glycol to take if she does not have an adequate bowel movement following magnesium citrate.  Results for orders placed or performed during the hospital encounter of 35/32/99  Basic metabolic panel  Result Value Ref Range   Sodium 138 135 - 145 mmol/L   Potassium 4.1 3.5 - 5.1 mmol/L   Chloride 97 (L) 98 - 111 mmol/L   CO2 24 22 - 32 mmol/L   Glucose, Bld 66 (L) 70 - 99 mg/dL   BUN 9 8 - 23 mg/dL   Creatinine, Ser 4.02 (H) 0.44 - 1.00 mg/dL   Calcium 8.4 (L) 8.9 - 10.3 mg/dL   GFR calc non Af Amer 10 (L) >60 mL/min   GFR calc Af Amer 11 (L) >60 mL/min   Anion gap 17 (H) 5 - 15  CBC  Result Value Ref Range   WBC 10.1 4.0 - 10.5 K/uL   RBC 3.90 3.87 - 5.11 MIL/uL   Hemoglobin 11.0 (L) 12.0 - 15.0 g/dL   HCT 35.4 (L) 36.0 - 46.0 %   MCV 90.8 78.0 - 100.0 fL   MCH 28.2 26.0 - 34.0 pg   MCHC 31.1 30.0 - 36.0 g/dL   RDW 18.6 (H) 11.5 - 15.5 %   Platelets 215 150 - 400 K/uL  Hepatic function panel  Result Value Ref Range   Total Protein 6.7 6.5 - 8.1 g/dL   Albumin 2.3 (L) 3.5 - 5.0 g/dL   AST 26 15 - 41 U/L   ALT 13 0 - 44 U/L   Alkaline Phosphatase 88 38 - 126 U/L   Total Bilirubin 1.0 0.3 - 1.2 mg/dL   Bilirubin, Direct 0.2 0.0 - 0.2 mg/dL   Indirect Bilirubin 0.8 0.3 - 0.9 mg/dL  Lipase, blood  Result Value Ref Range   Lipase 25 11 - 51 U/L  I-stat troponin, ED  Result Value Ref Range   Troponin i, poc 0.06 0.00 - 0.08 ng/mL   Comment 3           Ct Abdomen Pelvis  Wo Contrast  Result Date: 02/06/2018 CLINICAL DATA:  Abdominal pain and nausea since 1500 hours yesterday. Constipation. Evaluate for diverticulitis. EXAM: CT ABDOMEN AND PELVIS WITHOUT CONTRAST TECHNIQUE: Multidetector CT imaging of the abdomen and pelvis was performed following the standard protocol without IV contrast. COMPARISON:  CT 08/20/2017 FINDINGS: Lower chest: Normal size heart with moderate left main and three-vessel coronary arteriosclerosis and aortic atherosclerosis. Dialysis catheter is noted adjacent to the RA-IVC juncture. Bibasilar atelectasis is present though less prominent than on prior comparison. Hepatobiliary: The unenhanced liver is unremarkable. The gallbladder is surgically absent. Mild reservoir effect status post cholecystectomy involving the biliary ducts. Pancreas: Normal Spleen: Normal Adrenals/Urinary Tract: Stable left adrenal nodule consistent with an adenoma. Right adrenal gland is normal. Well-circumscribed hyperdense lesion of the interpolar left kidney is stable in appearance compatible with a hemorrhagic or proteinaceous cyst. Multiple smaller cysts are noted of both kidneys. Renal cortical thinning is identified bilaterally.  Stomach/Bowel: Small hiatal hernia. Contrast distended stomach without acute abnormalities. The duodenal sweep and ligament of Treitz are normal. Scattered colonic diverticulosis without acute diverticulitis. No bowel obstruction. Status post appendectomy. Large amount of retained stool in the rectosigmoid with probable mild fecal impaction. Vascular/Lymphatic: No adenopathy by CT size criteria. Extensive aortoiliac and branch vessel atherosclerosis. Reproductive: Hysterectomy.  No adnexal mass. Other: No free air nor free fluid. Musculoskeletal: Thoracolumbar spondylosis. No aggressive osseous lesions. IMPRESSION: 1. Increased fecal retention within the rectosigmoid compatible with constipation. Large stool ball in the rectum consistent with a  component of fecal impaction. 2. Diverticulosis without acute diverticulitis. 3. Redemonstration of hyperdense lesion in the interpolar left kidney compatible with a hemorrhagic or proteinaceous cyst. 4. Aortoiliac and branch vessel atherosclerosis. 5. Thoracolumbar spondylosis. Electronically Signed   By: Ashley Royalty M.D.   On: 02/06/2018 01:41   Dg Chest 2 View  Result Date: 02/05/2018 CLINICAL DATA:  Chest and abdomen pain following dialysis today. EXAM: CHEST - 2 VIEW COMPARISON:  01/15/2018. FINDINGS: Normal sized heart. Tortuous and partially calcified thoracic aorta. Stable left jugular catheter with its tip in the inferior right atrium near the inferior cavoatrial junction. Interval curvilinear density and linear density at the right lung base and minimal linear density in the left mid to lower lung zone. Otherwise, clear lungs. Thoracic spine degenerative changes. IMPRESSION: Interval small amount of linear atelectasis at the right lung base and minimal linear atelectasis in the left mid to lower lung zone. Otherwise, unremarkable examination. Electronically Signed   By: Claudie Revering M.D.   On: 02/05/2018 21:45   Dg Chest 2 View  Result Date: 01/15/2018 CLINICAL DATA:  Fall leg pain EXAM: CHEST - 2 VIEW COMPARISON:  01/12/2018, 12/21/2017, 08/20/2017 FINDINGS: Left-sided central venous catheter tip projects over the right atrium. Right mid lung opacity has resolved. Chronic elevation of right diaphragm. Low lung volume. Mildly enlarged cardiomediastinal silhouette with aortic atherosclerosis. No pneumothorax. IMPRESSION: No active cardiopulmonary disease.  Cardiomegaly. Electronically Signed   By: Donavan Foil M.D.   On: 01/15/2018 18:36   Dg Thoracic Spine 2 View  Result Date: 01/16/2018 CLINICAL DATA:  Mid back pain. EXAM: THORACIC SPINE 2 VIEWS COMPARISON:  01/15/2018 and prior chest radiographs FINDINGS: No acute fracture or subluxation. Mild multilevel degenerative disc disease in the LOWER  thoracic spine again noted. No focal bony lesions are present. A LEFT IJ central venous catheter is again noted. IMPRESSION: 1. No acute abnormality 2. Mild multilevel degenerative changes in the LOWER thoracic spine. Electronically Signed   By: Margarette Canada M.D.   On: 01/16/2018 13:52   Dg Lumbar Spine 2-3 Views  Result Date: 01/16/2018 CLINICAL DATA:  Chronic low back pain. EXAM: LUMBAR SPINE - 2-3 VIEW COMPARISON:  08/20/2017 CT and prior studies FINDINGS: No acute fracture or subluxation noted. Mild to moderate multilevel degenerative disc disease, spondylosis and facet arthropathy noted. No focal bony lesions identified. Aortic atherosclerotic calcifications again noted. IMPRESSION: 1. No acute abnormality 2. Mild to moderate multilevel degenerative changes. 3.  Aortic Atherosclerosis (ICD10-I70.0). Electronically Signed   By: Margarette Canada M.D.   On: 01/16/2018 13:51   Dg Knee 2 Views Right  Result Date: 01/15/2018 CLINICAL DATA:  Fall with knee pain EXAM: RIGHT KNEE - 1-2 VIEW COMPARISON:  10/05/2003 FINDINGS: Stent in the mid to distal thigh. No fracture or malalignment. Small knee effusion. Mild degenerative change of the medial compartment. IMPRESSION: No acute osseous abnormality.  Small knee effusion. Electronically Signed   By:  Donavan Foil M.D.   On: 01/15/2018 18:39   Dg Ankle Complete Right  Result Date: 01/26/2018 CLINICAL DATA:  Fall.  Ankle pain EXAM: RIGHT ANKLE - COMPLETE 3+ VIEW COMPARISON:  No recent prior. FINDINGS: Slight displaced oblique fracture of the distal fibula noted. Slight displaced fracture of the distal tip of the medial malleolus. No other focal abnormalities identified. Diffuse osteopenia degenerative change. IMPRESSION: 1. Slight displaced oblique fracture of the distal fibula. Slight displaced fracture of the distal tip of the medial malleolus. Electronically Signed   By: Marcello Moores  Register   On: 01/26/2018 13:03   Ct Head Wo Contrast  Result Date:  01/15/2018 CLINICAL DATA:  77 y/o F; 2 falls today. Word-finding difficulty. Right knee pain. Missed dialysis today. EXAM: CT HEAD WITHOUT CONTRAST CT CERVICAL SPINE WITHOUT CONTRAST TECHNIQUE: Multidetector CT imaging of the head and cervical spine was performed following the standard protocol without intravenous contrast. Multiplanar CT image reconstructions of the cervical spine were also generated. COMPARISON:  12/25/2017 CT head. FINDINGS: CT HEAD FINDINGS Brain: No evidence of acute infarction, hemorrhage, hydrocephalus, extra-axial collection or mass lesion/mass effect. Left anterior frontal stable encephalomalacia. Empty sella turcica. Vascular: Calcific atherosclerosis of carotid siphons. No hyperdense vessel. Skull: Left parietal scalp contusion. No calvarial fracture. Chronic postsurgical changes related to bifrontal craniotomy. Sinuses/Orbits: Left-sided ethmoidectomy and maxillary antrostomy postsurgical changes. Mild mucosal thickening of the paranasal sinuses. Normal aeration of the mastoid air cells. Bilateral intra-ocular lens replacement. Other: None. CT CERVICAL SPINE FINDINGS Alignment: C4-5 and C5-6 grade 1 anterolisthesis. Skull base and vertebrae: No acute fracture. No primary bone lesion or focal pathologic process. Soft tissues and spinal canal: No prevertebral fluid or swelling. No visible canal hematoma. 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 Cervical spondylosis with mild multilevel disc and moderate facet arthrosis. No high-grade bony canal stenosis. Upper chest: Left subclavian stent partially visualized. Calcific atherosclerosis of the carotid bifurcations and the aorta. Other: 10 mm nodule in the left lobe of the thyroid. IMPRESSION: 1. Left parietal scalp contusion. 2. No calvarial fracture or acute intracranial abnormality. 3. No acute fracture or dislocation of the cervical spine. 4. Stable left anterior frontal lobe  encephalomalacia and empty sella turcica. 5. Mild paranasal sinus mucosal thickening. 6. 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 Electronically Signed   By: Kristine Garbe M.D.   On: 01/15/2018 19:08   Ct Cervical Spine Wo Contrast  Result Date: 01/15/2018 CLINICAL DATA:  77 y/o F; 2 falls today. Word-finding difficulty. Right knee pain. Missed dialysis today. EXAM: CT HEAD WITHOUT CONTRAST CT CERVICAL SPINE WITHOUT CONTRAST TECHNIQUE: Multidetector CT imaging of the head and cervical spine was performed following the standard protocol without intravenous contrast. Multiplanar CT image reconstructions of the cervical spine were also generated. COMPARISON:  12/25/2017 CT head. FINDINGS: CT HEAD FINDINGS Brain: No evidence of acute infarction, hemorrhage, hydrocephalus, extra-axial collection or mass lesion/mass effect. Left anterior frontal stable encephalomalacia. Empty sella turcica. Vascular: Calcific atherosclerosis of carotid siphons. No hyperdense vessel. Skull: Left parietal scalp contusion. No calvarial fracture. Chronic postsurgical changes related to bifrontal craniotomy. Sinuses/Orbits: Left-sided ethmoidectomy and maxillary antrostomy postsurgical changes. Mild mucosal thickening of the paranasal sinuses. Normal aeration of the mastoid air cells. Bilateral intra-ocular lens replacement. Other: None. CT CERVICAL SPINE FINDINGS Alignment: C4-5 and C5-6 grade 1 anterolisthesis. Skull base and vertebrae: No acute fracture. No primary bone lesion or focal pathologic process. Soft tissues and spinal canal:  No prevertebral fluid or swelling. No visible canal hematoma. 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 Cervical spondylosis with mild multilevel disc and moderate facet arthrosis. No high-grade bony canal stenosis. Upper chest: Left subclavian stent partially visualized. Calcific  atherosclerosis of the carotid bifurcations and the aorta. Other: 10 mm nodule in the left lobe of the thyroid. IMPRESSION: 1. Left parietal scalp contusion. 2. No calvarial fracture or acute intracranial abnormality. 3. No acute fracture or dislocation of the cervical spine. 4. Stable left anterior frontal lobe encephalomalacia and empty sella turcica. 5. Mild paranasal sinus mucosal thickening. 6. 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 Electronically Signed   By: Kristine Garbe M.D.   On: 01/15/2018 19:08   Mr Brain Wo Contrast  Result Date: 01/15/2018 CLINICAL DATA:  Focal neuro deficit, > 6 hrs, stroke suspected. Word-finding difficulty and multiple falls EXAM: MRI HEAD WITHOUT CONTRAST TECHNIQUE: Multiplanar, multiecho pulse sequences of the brain and surrounding structures were obtained without intravenous contrast. COMPARISON:  Head CT 01/15/2018 FINDINGS: The examination had to be discontinued prior to completion due to patient inability to cooperate with the technologist's instructions. The patient was attempting to remove the head coil. There are 9 series provided. Axial T1-weighted imaging and coronal T2-weighted imaging was not obtained. BRAIN: There is no acute infarct, acute hemorrhage or mass effect. Partially empty sella. Left frontal encephalomalacia is unchanged. 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 White matter signal is otherwise normal. The CSF spaces are normal for age, with no hydrocephalus. Susceptibility-sensitive sequences show no chronic microhemorrhage or superficial siderosis. VASCULAR: Major intracranial arterial and venous sinus flow voids are preserved. SKULL AND UPPER CERVICAL SPINE: Remote bifrontal craniotomy. Large left parietal scalp hematoma. SINUSES/ORBITS: No fluid levels or advanced mucosal thickening. No mastoid or middle ear effusion. There are bilateral lens replacements. IMPRESSION: 1. Truncated  examination.  No acute infarct. 2. Left frontal encephalomalacia and multiple punctate, old cerebellar infarcts. 3. Large left parietal scalp hematoma. Electronically Signed   By: Ulyses Jarred M.D.   On: 01/15/2018 22:51   Mr Lumbar Spine Wo Contrast  Result Date: 01/17/2018 CLINICAL DATA:  Right lower extremity weakness. Multiple falls recently. EXAM: MRI LUMBAR SPINE WITHOUT CONTRAST TECHNIQUE: Multiplanar, multisequence MR imaging of the lumbar spine was performed. No intravenous contrast was administered. COMPARISON:  MRI lumbar spine dated January 17, 2017. FINDINGS: Segmentation:  Standard. Alignment:  Unchanged trace anterolisthesis at L4-L5. Vertebrae: Unchanged heterogeneous marrow signal without focal lesion. No fracture or evidence of discitis. Scattered chronic degenerative endplate changes. Conus medullaris and cauda equina: Conus extends to the L2 level. Conus and cauda equina appear normal. No epidural hematoma. Paraspinal and other soft tissues: Unchanged bilateral renal cortical atrophy and multiple simple cysts. Unchanged T1 hyperintense, T2 iso- to hypointense lesion in the left kidney, likely a hemorrhagic or proteinaceous cyst. Colonic diverticulosis. Disc levels: T9-T10: Mild disc degeneration and facet arthropathy with mild right neuroforaminal stenosis. T10-T11: Mild disc degeneration and facet arthropathy with mild right neuroforaminal stenosis. T11-T12: Mild disc degeneration and facet arthropathy with mild right neuroforaminal stenosis. T12-L1:  Negative. L1-L2:  Negative. L2-L3: Diffuse disc bulge, eccentric to the right. Mild bilateral facet arthropathy. Mild-to-moderate central spinal canal stenosis. No neuroforaminal stenosis. Findings are similar to prior study. L3-L4: Small disc bulge and moderate bilateral facet arthropathy with ligamentum flavum hypertrophy mild central spinal canal stenosis. Moderate left lateral recess stenosis. No neuroforaminal stenosis. Findings have  progressed when compared to  prior study. L4-L5: Diffuse disc bulge and moderate bilateral facet arthropathy with small facet joint effusions. Ligamentum flavum hypertrophy. Mild central spinal canal and bilateral recess stenosis. No neuroforaminal stenosis. Findings are similar to prior study. L5-S1: Normal disc. Severe bilateral facet arthropathy. Mild bilateral neuroforaminal stenosis. No spinal canal stenosis. Findings are similar to prior study. IMPRESSION: 1.  No acute abnormality. 2. Multilevel degenerative changes of the lumbar spine as described above, slightly progressed at L3-L4 where there is new mild central spinal canal stenosis and moderate left lateral recess stenosis. 3. Unchanged mild to moderate central spinal canal stenosis at L2-L3. Unchanged mild bilateral neuroforaminal stenosis at L5-S1. 4. Unchanged moderate to severe lumbar facet arthropathy. Electronically Signed   By: Titus Dubin M.D.   On: 01/17/2018 14:37   Mr Pelvis Wo Contrast  Result Date: 01/17/2018 CLINICAL DATA:  Two falls over the past few days. Possible inferior pubic ramus fracture on x-ray. EXAM: MRI PELVIS WITHOUT CONTRAST TECHNIQUE: Multiplanar multisequence MR imaging of the pelvis was performed. No intravenous contrast was administered. COMPARISON:  Bilateral hip x-rays dated January 15, 2018. CT abdomen pelvis dated August 20, 2017. FINDINGS: Bones: There is no evidence of acute fracture, dislocation or avascular necrosis. Heterogeneous marrow signal without focal lesion. Small bilateral sacroiliac joint effusions, likely degenerative. Mild degenerative changes of the pubic symphysis. Articular cartilage and labrum Articular cartilage: No focal chondral defect or subchondral signal abnormality identified. Labrum: There is no gross labral tear or paralabral abnormality. Joint or bursal effusion Joint effusion: No significant hip joint effusion. Bursae: No focal periarticular fluid collection. Muscles and tendons  Muscles and tendons: The visualized sartorius, rectus femoris, gluteus, hamstring and iliopsoas tendons appear normal. Mild edema within the proximal right obturator externus and adductor brevis muscles adjacent to the pubic symphysis. Faint edema within the bilateral piriformis muscles. Mild bilateral gluteus muscle atrophy. Other findings Miscellaneous: Prior hysterectomy. Trace presacral fluid. Sigmoid diverticulosis. IMPRESSION: 1.  No acute osseous abnormality. 2. Mild edema within the proximal right obturator externus and adductor brevis muscles could reflect strain. These results were called by telephone at the time of interpretation on 01/17/2018 at 5:00 pm to Dr. Verlon Au , who verbally acknowledged these results. Electronically Signed   By: Titus Dubin M.D.   On: 01/17/2018 17:14   Mr Knee Right Wo Contrast  Result Date: 01/19/2018 CLINICAL DATA:  Right knee pain and swelling. Two falls in the last 24 hours. EXAM: MRI OF THE RIGHT KNEE WITHOUT CONTRAST TECHNIQUE: Multiplanar, multisequence MR imaging of the knee was performed. No intravenous contrast was administered. COMPARISON:  Radiographs dated 01/15/2018 FINDINGS: MENISCI Medial meniscus:  Intact. Lateral meniscus: Intact but diffusely intrinsically degenerated with some fraying of the superior surface of the anterior horn. LIGAMENTS Cruciates:  Normal. Collaterals:  Normal. CARTILAGE Patellofemoral:  Normal. Medial:  Normal. Lateral: Tiny focal fissure in the posterior central aspect of the femoral condyle. Joint: Moderate joint effusion. Debris and synovial hypertrophy in the joint. Popliteal Fossa: Small Baker's cyst containing debris. Intact popliteus tendon. Extensor Mechanism:  Normal. Bones: Tiny nondisplaced subcortical fracture of the posterior margin of lateral tibial plateau best seen on images 7 and 8 of series 19. No other significant bone abnormality. Other: None IMPRESSION: 1. Tiny nondisplaced fracture of the posterior rim of  the lateral tibial plateau. 2. Moderate joint effusion with some debris in the joint. 3. Small Baker's cyst containing debris. 4. Degenerative changes of the lateral meniscus as described. Electronically Signed   By: Jeneen Rinks  Maxwell M.D.   On: 01/19/2018 11:47   Dg Chest Portable 1 View  Result Date: 01/12/2018 CLINICAL DATA:  Dialysis yesterday. Wheezing this afternoon. Chest discomfort. Bradycardia. Rales. EXAM: PORTABLE CHEST 1 VIEW COMPARISON:  12/21/2017 FINDINGS: Left central venous dialysis catheter with tip over the right atrium. No pneumothorax. Shallow inspiration with elevation of right hemidiaphragm. Right perihilar infiltrates are new since previous study and may indicate developing pneumonia. Calcification of the aorta. No blunting of costophrenic angles. IMPRESSION: New development of right perihilar infiltrates suggesting developing pneumonia. Electronically Signed   By: Lucienne Capers M.D.   On: 01/12/2018 04:16   Dg Hips Bilat W Or Wo Pelvis 3-4 Views  Result Date: 01/15/2018 CLINICAL DATA:  Fall with bilateral leg pain EXAM: DG HIP (WITH OR WITHOUT PELVIS) 3-4V BILAT COMPARISON:  CT 08/20/2017 FINDINGS: SI joints are non widened. The pubic symphysis is intact. Possible acute minimally displaced fracture involving the right inferior pubic ramus. Both femoral heads project in joint. Vascular calcifications.  Stent in the right thigh. IMPRESSION: Possible acute minimally displaced fracture involving the right inferior pubic ramus. Otherwise no acute osseous abnormality is seen. Electronically Signed   By: Donavan Foil M.D.   On: 82/42/3536 14:43      Delora Fuel, MD 15/40/08 0157

## 2018-02-06 NOTE — ED Notes (Signed)
Patient transported to CT 

## 2018-02-06 NOTE — Discharge Instructions (Addendum)
If you do not have a good bowel movement from the medication he received in the emergency department, then get the prescription for GoLYTELY filled.  Drink 8 ounces every 15 minutes until you are passing clear liquids.  Return if symptoms are worsening.

## 2018-02-08 DIAGNOSIS — N186 End stage renal disease: Secondary | ICD-10-CM | POA: Diagnosis not present

## 2018-02-08 DIAGNOSIS — E162 Hypoglycemia, unspecified: Secondary | ICD-10-CM | POA: Diagnosis not present

## 2018-02-08 DIAGNOSIS — E1129 Type 2 diabetes mellitus with other diabetic kidney complication: Secondary | ICD-10-CM | POA: Diagnosis not present

## 2018-02-08 DIAGNOSIS — Z992 Dependence on renal dialysis: Secondary | ICD-10-CM | POA: Diagnosis not present

## 2018-02-08 DIAGNOSIS — N2581 Secondary hyperparathyroidism of renal origin: Secondary | ICD-10-CM | POA: Diagnosis not present

## 2018-02-09 DIAGNOSIS — E162 Hypoglycemia, unspecified: Secondary | ICD-10-CM | POA: Diagnosis not present

## 2018-02-09 DIAGNOSIS — N186 End stage renal disease: Secondary | ICD-10-CM | POA: Diagnosis not present

## 2018-02-10 DIAGNOSIS — N186 End stage renal disease: Secondary | ICD-10-CM | POA: Diagnosis not present

## 2018-02-10 DIAGNOSIS — E162 Hypoglycemia, unspecified: Secondary | ICD-10-CM | POA: Diagnosis not present

## 2018-02-12 ENCOUNTER — Non-Acute Institutional Stay: Payer: Self-pay | Admitting: Internal Medicine

## 2018-02-12 DIAGNOSIS — Z515 Encounter for palliative care: Secondary | ICD-10-CM

## 2018-02-12 NOTE — Progress Notes (Signed)
    PALLIATIVE CARE CONSULT VISIT   PATIENT NAME: [name] DOB: 10-27-1940 MRN: [id number]  PRIMARY CARE PROVIDER:   [place], Betty G, MD  REFERRING PROVIDER:  Dr. Virgel Bouquet RESPONSIBLE PARTY:   Self       RECOMMENDATIONS and PLAN:  1. Palliative care patient      NOTE:  Arrived at [facility] for [place] visit with patient.  Patient was not located at facility.  Contact information was left in patient room.  Plan on return visit on an alternate date.    Gonzella Lex, NP-C

## 2018-02-15 ENCOUNTER — Non-Acute Institutional Stay: Payer: Self-pay | Admitting: Internal Medicine

## 2018-02-15 DIAGNOSIS — R52 Pain, unspecified: Secondary | ICD-10-CM | POA: Diagnosis not present

## 2018-02-15 DIAGNOSIS — R079 Chest pain, unspecified: Secondary | ICD-10-CM | POA: Diagnosis not present

## 2018-02-15 DIAGNOSIS — S90222D Contusion of left lesser toe(s) with damage to nail, subsequent encounter: Secondary | ICD-10-CM | POA: Diagnosis not present

## 2018-02-15 DIAGNOSIS — Z7189 Other specified counseling: Secondary | ICD-10-CM

## 2018-02-15 DIAGNOSIS — R5383 Other fatigue: Secondary | ICD-10-CM

## 2018-02-16 ENCOUNTER — Non-Acute Institutional Stay: Payer: Self-pay | Admitting: Internal Medicine

## 2018-02-16 DIAGNOSIS — Z7189 Other specified counseling: Secondary | ICD-10-CM | POA: Diagnosis not present

## 2018-02-16 DIAGNOSIS — R52 Pain, unspecified: Secondary | ICD-10-CM

## 2018-02-16 DIAGNOSIS — R5383 Other fatigue: Secondary | ICD-10-CM | POA: Diagnosis not present

## 2018-02-17 ENCOUNTER — Telehealth: Payer: Self-pay | Admitting: Family Medicine

## 2018-02-17 DIAGNOSIS — S82891D Other fracture of right lower leg, subsequent encounter for closed fracture with routine healing: Secondary | ICD-10-CM | POA: Diagnosis not present

## 2018-02-17 DIAGNOSIS — I82409 Acute embolism and thrombosis of unspecified deep veins of unspecified lower extremity: Secondary | ICD-10-CM | POA: Diagnosis not present

## 2018-02-17 DIAGNOSIS — N186 End stage renal disease: Secondary | ICD-10-CM | POA: Diagnosis not present

## 2018-02-17 DIAGNOSIS — S32599D Other specified fracture of unspecified pubis, subsequent encounter for fracture with routine healing: Secondary | ICD-10-CM | POA: Diagnosis not present

## 2018-02-17 NOTE — Telephone Encounter (Signed)
Copied from [place] [id number]. Topic: Quick Communication - See Telephone Encounter >> Feb 17, 2018 10:09 AM Antonieta Iba C wrote: CRM for notification. See Telephone encounter for: 02/17/18.[name] w/ hospice care called in because pt is being discharged and pt is requesting to have PCP attending of record. They just received request but is asking if they could receive a response as soon as possible.   CB: [phone]

## 2018-02-17 NOTE — Telephone Encounter (Signed)
Spoke with Danise Mina and she stated that Mrs. Ruppel is leaving [employer] and was referred for [facility] and she was confirming that Dr. Martinique was her attending PCP.

## 2018-02-17 NOTE — Telephone Encounter (Signed)
Copied from [place] [id number]. Topic: General - Other >> Feb 17, 2018  1:09 PM Yvette Rack wrote: Reason for CRM: Jen with [facility] returned call to office. Cb# [phone]

## 2018-02-17 NOTE — Telephone Encounter (Signed)
Left message for [name] from hospice to give me a call back concerning patient.

## 2018-02-17 NOTE — Progress Notes (Signed)
PALLIATIVE CARE CONSULT VISIT   PATIENT NAME: [name] DOB: 06-01-1941 MRN: [id number]  PRIMARY CARE PROVIDER:   Martinique, Betty G, MD  REFERRING PROVIDER:  Dr Virgel Bouquet  RESPONSIBLE PARTY:   Self and daughter Benjaman Pott  [phone]     RECOMMENDATIONS and PLAN:  1.  Fatigue  R53.83:  Related to discontinuation of hemodialysis treatments.  Expect additional decline with natural progression of ESRD.  Supportive EOL care.  2. Pain R52:  Related to multiple fracture sites.  She declines sedative meds but is receptive to scheduled Acetaminophen.  Continue to monitor and escalate pain management as needed.    3.  Advanced care planning and goals of care  Z71.89:  Previous declination of DNAR/DNI. MOST form has been completed with selections of limited additional intervention, antibiotics and IV therapy is indicated and no feeding tube.  She has decided against any further return to the hospital.  She expressed firmly that she no longer wants to receive dialysis treatments(7 days post last treatment) and is aware that this will cause the end of her life.  She has spoken to her nephrologist and family related to these decisions.  She is agreeable to receiving Hospice care which is recommended but is unsure if she will remain at snf or return to her home.  She is not a candidate for inpatient Hospice at this time.  Plan for family meeting with all of pt's children and pt for further explanation and planning.  Encouraged pt and daughter to discuss specifics with business office personel.  Meeting set for 02/16/18 at 4:15pm at snf.    I spent 105 minutes providing this consultation,  from 3:15pm to 5:00pm at [facility]. More than 50% of the time in this consultation was spent coordinating communication with patient, daughter and sister.   HISTORY OF PRESENT ILLNESS:  [name] is a 77 y.o. year old female with multiple medical problems including ESRD with discontinuation of hemodialysis  on 02/08/18, recent fall sustaning R knee/ ankle fractures, pelvic fractures and pneumonia.  She was hospitalized from 8/16-8/22 due to PNA and a fall.  She had a thrombectomy of her L arm graft on 8/28. She returned to the hospital on 8/27 due to ankle pain(fracture) and on 9/6 due to chest and abdomen pain( constipation with impaction). 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  She has been receiving dialysis treatments for numerous years and has decided to forego any additional treatments for ESRD due to the most recent functional declines of her health, inability to walk and dependence upon others to perform all ADLs.  Palliative Care was asked to help address goals of care, symptom management, advanced care planning end of life needs.   CODE STATUS: DNR/DNI  Do not return to hospital  PPS: 40% HOSPICE ELIGIBILITY/DIAGNOSIS: YES/ ESRD with discontinuance of hemodialysis  PAST MEDICAL HISTORY:  Past Medical History:  Diagnosis Date  . Abnormality of gait 03/28/2015  . Adrenal mass ([place])   . 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 Arthritis   . Back pain   . CHF (congestive heart failure) ([place])   . Chronic kidney disease    Hemo MWF  . Congestion of throat    Pt states she has a lot mucus in back throat.  . Constipation   . COPD (chronic  obstructive pulmonary disease) ([place])   . Depression   . Dialysis patient [facility])    kidney  . Diverticulitis   . GERD (gastroesophageal reflux disease)   . H/O hiatal hernia   . High cholesterol   . Hoarseness of voice   . Hyperlipidemia   . Hypertension   . IBS (irritable bowel syndrome)   . Meralgia paresthetica of right side 12/26/2014  . Normal cardiac stress test 12/24/2009   lexiscan, imaging normal  . PAD (peripheral artery disease) ([place]) 12/21/2017  . Pneumonia    [id number]  . Renal disorder   . RLS (restless legs  syndrome)   . Seizures ([place])    2004 past brain surgery  . Sinus complaint   . Stroke ([place])    TIAs per patient 2 or 3  . Thyroid disease   . TIA (transient ischemic attack)   . Tubular adenoma of colon 01/2008    SOCIAL HX:  Social History   Tobacco Use  . Smoking status: Former Smoker    Packs/day: 1.00    Years: 60.00    Pack years: 60.00    Types: Cigarettes    Last attempt to quit: 12/17/2017    Years since quitting: 0.1  . Smokeless tobacco: Never Used  . Tobacco comment: smoker since 77 yo.  1.5 ppd of [place] 100 lights. ; form given 10-23-16  Substance Use Topics  . Alcohol use: No    Alcohol/week: 0.0 standard drinks    ALLERGIES:  Allergies  Allergen Reactions  . Tuberculin Tests Hives    "blisters"  . Valacyclovir Other (See Comments)    Confusion and nervousness  . Codeine Nausea And Vomiting  . Penicillins Rash    No problems breathing. Has tolerated omnicef in past without issue Has patient had a PCN reaction causing immediate rash, facial/tongue/throat swelling, SOB or lightheadedness with hypotension: Yes Has patient had a PCN reaction causing severe rash involving mucus membranes or skin necrosis: No Has patient had a PCN reaction that required hospitalization No Has patient had a PCN reaction occurring within the last 10 years: No If all of the above answers are "NO", then may proceed with Cephalosporin  . Sulfamethoxazole Rash     PERTINENT MEDICATIONS:  Outpatient Encounter Medications as of 02/15/2018  Medication Sig  . acetaminophen (TYLENOL) 325 MG tablet Take 650 mg by mouth every 6 (six) hours as needed for mild pain.  Marland Kitchen albuterol (PROVENTIL HFA;VENTOLIN HFA) 108 (90 Base) MCG/ACT inhaler Inhale 2 puffs into the lungs every 4 (four) hours as needed for wheezing or shortness of breath.  . allopurinol (ZYLOPRIM) 100 MG tablet Take 100 mg by mouth once daily on non-dialysis days (Tues/Thurs/Sat/Sun) (Patient taking differently: Take 100 mg by mouth  every Tuesday, Thursday, Saturday, and Sunday. )  . Amino Acids-Protein Hydrolys (FEEDING SUPPLEMENT, PRO-STAT SUGAR FREE 64,) LIQD Take 30 mLs by mouth daily.  Marland Kitchen amLODipine (NORVASC) 10 MG tablet TAKE 1 TABLET BY MOUTH AT BEDTIME (Patient taking differently: Take 10 mg by mouth at bedtime. )  . atorvastatin (LIPITOR) 40 MG tablet Take 1 tablet (40 mg total) by mouth daily. (Patient taking differently: Take 40 mg by mouth at bedtime. )  . azelastine (ASTELIN) 0.1 % nasal spray Place 1 spray into both nostrils 2 (two) times daily. Use in each nostril as directed  . B Complex-C-Zn-Folic Acid (DIALYVITE/ZINC) TABS Take 1 tablet by mouth daily.  . budesonide-formoterol (SYMBICORT) 160-4.5 MCG/ACT inhaler Inhale 2 puffs into the lungs 2 (two)  times daily.  . cinacalcet (SENSIPAR) 90 MG tablet Take 90 mg by mouth at bedtime.  . clopidogrel (PLAVIX) 75 MG tablet Take 1 tablet (75 mg total) by mouth daily.  . DULoxetine (CYMBALTA) 30 MG capsule Take 2 capsules (60 mg total) by mouth daily.  . fluticasone (FLONASE) 50 MCG/ACT nasal spray Place 2 sprays into both nostrils daily as needed for allergies or rhinitis.  Marland Kitchen glycopyrrolate (ROBINUL) 2 MG tablet Take 1 tablet (2 mg total) by mouth 2 (two) times daily.  Marland Kitchen guaifenesin (HUMIBID E) 400 MG TABS tablet Take 400 mg by mouth 2 (two) times daily.  . hydrOXYzine (ATARAX/VISTARIL) 25 MG tablet Take 1 tablet (25 mg total) by mouth every 8 (eight) hours as needed for itching. (Patient not taking: Reported on 02/06/2018)  . lidocaine-prilocaine (EMLA) cream Apply 1 application topically See admin instructions. Apply topically one hour before dialysis - Monday, Wednesday, Friday  . loratadine (CLARITIN) 10 MG tablet 10 mg every other day. (Patient taking differently: Take 10 mg by mouth daily. )  . losartan (COZAAR) 100 MG tablet Take 1 tablet (100 mg total) by mouth daily.  . metoprolol succinate (TOPROL-XL) 100 MG 24 hr tablet TAKE 1 TABLET (100 MG)  BY MOUTH TWICE  DAILY (Patient taking differently: Take 100 mg by mouth 2 (two) times daily. )  . multivitamin (RENA-VIT) TABS tablet Take 1 tablet by mouth at bedtime. (Patient not taking: Reported on 02/06/2018)  . oxyCODONE-acetaminophen (PERCOCET) 5-325 MG tablet Take 1 tablet by mouth every 8 (eight) hours as needed for severe pain. (Patient taking differently: Take 0.5-1 tablets by mouth every 8 (eight) hours as needed for severe pain. )  . polyethylene glycol powder (MIRALAX) powder Take 17 g by mouth daily.  . pregabalin (LYRICA) 100 MG capsule Take 100 mg by mouth at bedtime.   . senna (SENOKOT) 8.6 MG TABS tablet Take 1 tablet by mouth 2 (two) times daily as needed for mild constipation.  . senna-docusate (SENOKOT-S) 8.6-50 MG tablet Take 1 tablet by mouth 2 (two) times daily. (Patient not taking: Reported on 02/06/2018)  . sevelamer carbonate (RENVELA) 800 MG tablet Take 2,400 mg by mouth 3 (three) times daily with meals.   . traZODone (DESYREL) 50 MG tablet Take 50 mg by mouth at bedtime.   No facility-administered encounter medications on file as of 02/15/2018.     PHYSICAL EXAM:   General: NAD, Chronically ill and fatigued appearing Cardiovascular: regular rate and rhythm Pulmonary: clear in all lung fields Abdomen: soft, nontender, + bowel sounds GU: no suprapubic tenderness Extremities: Tenderness and trace edema of anterior BLE R>L Skin: Exposed skin is intact Neurological: Alert and oriented.  Weakness  Gonzella Lex, NP-C

## 2018-02-17 NOTE — Telephone Encounter (Signed)
Spoke with Danise Mina and she stated that Mrs. Milford is leaving [employer] and was referred for [facility] and she was confirming that Dr. Martinique was her attending PCP.

## 2018-02-18 ENCOUNTER — Encounter (INDEPENDENT_AMBULATORY_CARE_PROVIDER_SITE_OTHER): Payer: Medicare Other

## 2018-02-18 ENCOUNTER — Ambulatory Visit (INDEPENDENT_AMBULATORY_CARE_PROVIDER_SITE_OTHER): Payer: Medicare Other | Admitting: Vascular Surgery

## 2018-02-18 DIAGNOSIS — I679 Cerebrovascular disease, unspecified: Secondary | ICD-10-CM | POA: Diagnosis not present

## 2018-02-18 DIAGNOSIS — I10 Essential (primary) hypertension: Secondary | ICD-10-CM | POA: Diagnosis not present

## 2018-02-18 DIAGNOSIS — N186 End stage renal disease: Secondary | ICD-10-CM | POA: Diagnosis not present

## 2018-02-18 DIAGNOSIS — R52 Pain, unspecified: Secondary | ICD-10-CM | POA: Insufficient documentation

## 2018-02-18 DIAGNOSIS — K589 Irritable bowel syndrome without diarrhea: Secondary | ICD-10-CM | POA: Diagnosis not present

## 2018-02-18 DIAGNOSIS — I739 Peripheral vascular disease, unspecified: Secondary | ICD-10-CM | POA: Diagnosis not present

## 2018-02-18 DIAGNOSIS — Z7189 Other specified counseling: Secondary | ICD-10-CM | POA: Insufficient documentation

## 2018-02-18 DIAGNOSIS — F339 Major depressive disorder, recurrent, unspecified: Secondary | ICD-10-CM | POA: Diagnosis not present

## 2018-02-18 DIAGNOSIS — I509 Heart failure, unspecified: Secondary | ICD-10-CM | POA: Diagnosis not present

## 2018-02-18 DIAGNOSIS — K219 Gastro-esophageal reflux disease without esophagitis: Secondary | ICD-10-CM | POA: Diagnosis not present

## 2018-02-18 DIAGNOSIS — R569 Unspecified convulsions: Secondary | ICD-10-CM | POA: Diagnosis not present

## 2018-02-18 NOTE — Progress Notes (Signed)
PALLIATIVE CARE CONSULT VISIT   PATIENT NAME: [name] DOB: [birth year] MRN: [id number]  PRIMARY CARE PROVIDER:   Martinique, Betty G, MD  REFERRING PROVIDER:  Dr Virgel Bouquet  RESPONSIBLE PARTY:   Self and daughter Benjaman Pott  [phone]     RECOMMENDATIONS and PLAN:  1.  Fatigue  R53.83:  Related to discontinuation of hemodialysis treatments. Unchanged.    Wheelchair and assistance for mobilization.  Comfort feedings.  Supportive EOL care.  2. Pain R52:  Related to multiple fracture sites and ESRD progression.  Partially improved with use of Acetaminophen. NTG encouraged for chest pain.  Escalate pain management as pt is receptive.  3.  Advanced care planning and goals of care  Z71.89:  Long discussion with pt and her 3 children.  SW Margreta Journey is present during discussion.  Explanation of expectations with the termination of hemodialysis and recommendation for Hospice care rather than Palliative care services.  All parties express that they understand that this decision will cause end of life.  Pt states that they have discussed her status and she is in favor of being discharged from SNF and returning to her home asap.  Family is in agreement and are supportive of patient.  They state that they will assist in her care at home.  Questions answered.  Hospice referral to [facility] will be arranged by SW.  Pt is to be discharged to home on 02/17/18 per discussion with facility provider. Support given.  I spent 45 minutes providing this consultation,  from 4:15pm to 5:00pm at [facility]. More than 50% of the time in this consultation was spent coordinating communication with patient, daughters, son, SW and clinical staff.  HISTORY OF PRESENT ILLNESS: Family meeting and follow-up with [name].  Pt remains mobile only via wheelchair and has a good appetite currently.  She does report intermittent palpitations and left chest pain which she declines treatment of.     CODE STATUS: DNR/DNI   Do not return to hospital  PPS: 40% HOSPICE ELIGIBILITY/DIAGNOSIS: YES/ ESRD with discontinuance of hemodialysis  PAST MEDICAL HISTORY:  Past Medical History:  Diagnosis Date  . Abnormality of gait 03/28/2015  . Adrenal mass ([place])   . 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 Arthritis   . Back pain   . CHF (congestive heart failure) ([place])   . Chronic kidney disease    Hemo MWF  . Congestion of throat    Pt states she has a lot mucus in back throat.  . Constipation   . COPD (chronic obstructive pulmonary disease) ([place])   . Depression   . Dialysis patient [facility])    kidney  . Diverticulitis   . GERD (gastroesophageal reflux disease)   . H/O hiatal hernia   . High cholesterol   . Hoarseness of voice   . Hyperlipidemia   . Hypertension   . IBS (irritable bowel syndrome)   . Meralgia paresthetica of right side 12/26/2014  . Normal cardiac stress test 12/24/2009   lexiscan, imaging normal  . PAD (peripheral artery disease) ([place]) 12/21/2017  . Pneumonia    [id number]  . Renal disorder   . RLS (restless legs syndrome)   . Seizures ([place])    2004 past brain surgery  . Sinus complaint   . Stroke ([place])    TIAs per patient 2 or 3  . Thyroid disease   . TIA (transient ischemic attack)   .  Tubular adenoma of colon 01/2008    SOCIAL HX:  Social History   Tobacco Use  . Smoking status: Former Smoker    Packs/day: 1.00    Years: 60.00    Pack years: 60.00    Types: Cigarettes    Last attempt to quit: 12/17/2017    Years since quitting: 0.1  . Smokeless tobacco: Never Used  . Tobacco comment: smoker since 77 yo.  1.5 ppd of [place] 100 lights. ; form given 10-23-16  Substance Use Topics  . Alcohol use: No    Alcohol/week: 0.0 standard drinks    ALLERGIES:  Allergies  Allergen Reactions  . Tuberculin Tests Hives    "blisters"  . Valacyclovir Other (See Comments)    Confusion and nervousness  . Codeine Nausea And Vomiting  . Penicillins  Rash    No problems breathing. Has tolerated omnicef in past without issue Has patient had a PCN reaction causing immediate rash, facial/tongue/throat swelling, SOB or lightheadedness with hypotension: Yes Has patient had a PCN reaction causing severe rash involving mucus membranes or skin necrosis: No Has patient had a PCN reaction that required hospitalization No Has patient had a PCN reaction occurring within the last 10 years: No If all of the above answers are "NO", then may proceed with Cephalosporin  . Sulfamethoxazole Rash     PERTINENT MEDICATIONS:  Outpatient Encounter Medications as of 02/15/2018  Medication Sig  . acetaminophen (TYLENOL) 325 MG tablet Take 650 mg by mouth every 6 (six) hours as needed for mild pain.  Marland Kitchen albuterol (PROVENTIL HFA;VENTOLIN HFA) 108 (90 Base) MCG/ACT inhaler Inhale 2 puffs into the lungs every 4 (four) hours as needed for wheezing or shortness of breath.  . allopurinol (ZYLOPRIM) 100 MG tablet Take 100 mg by mouth once daily on non-dialysis days (Tues/Thurs/Sat/Sun) (Patient taking differently: Take 100 mg by mouth every Tuesday, Thursday, Saturday, and Sunday. )  . Amino Acids-Protein Hydrolys (FEEDING SUPPLEMENT, PRO-STAT SUGAR FREE 64,) LIQD Take 30 mLs by mouth daily.  Marland Kitchen amLODipine (NORVASC) 10 MG tablet TAKE 1 TABLET BY MOUTH AT BEDTIME (Patient taking differently: Take 10 mg by mouth at bedtime. )  . atorvastatin (LIPITOR) 40 MG tablet Take 1 tablet (40 mg total) by mouth daily. (Patient taking differently: Take 40 mg by mouth at bedtime. )  . azelastine (ASTELIN) 0.1 % nasal spray Place 1 spray into both nostrils 2 (two) times daily. Use in each nostril as directed  . B Complex-C-Zn-Folic Acid (DIALYVITE/ZINC) TABS Take 1 tablet by mouth daily.  . budesonide-formoterol (SYMBICORT) 160-4.5 MCG/ACT inhaler Inhale 2 puffs into the lungs 2 (two) times daily.  . cinacalcet (SENSIPAR) 90 MG tablet Take 90 mg by mouth at bedtime.  . clopidogrel (PLAVIX)  75 MG tablet Take 1 tablet (75 mg total) by mouth daily.  . DULoxetine (CYMBALTA) 30 MG capsule Take 2 capsules (60 mg total) by mouth daily.  . fluticasone (FLONASE) 50 MCG/ACT nasal spray Place 2 sprays into both nostrils daily as needed for allergies or rhinitis.  Marland Kitchen glycopyrrolate (ROBINUL) 2 MG tablet Take 1 tablet (2 mg total) by mouth 2 (two) times daily.  Marland Kitchen guaifenesin (HUMIBID E) 400 MG TABS tablet Take 400 mg by mouth 2 (two) times daily.  . hydrOXYzine (ATARAX/VISTARIL) 25 MG tablet Take 1 tablet (25 mg total) by mouth every 8 (eight) hours as needed for itching. (Patient not taking: Reported on 02/06/2018)  . lidocaine-prilocaine (EMLA) cream Apply 1 application topically See admin instructions. Apply topically one hour  before dialysis - Monday, Wednesday, Friday  . loratadine (CLARITIN) 10 MG tablet 10 mg every other day. (Patient taking differently: Take 10 mg by mouth daily. )  . losartan (COZAAR) 100 MG tablet Take 1 tablet (100 mg total) by mouth daily.  . metoprolol succinate (TOPROL-XL) 100 MG 24 hr tablet TAKE 1 TABLET (100 MG)  BY MOUTH TWICE DAILY (Patient taking differently: Take 100 mg by mouth 2 (two) times daily. )  . multivitamin (RENA-VIT) TABS tablet Take 1 tablet by mouth at bedtime. (Patient not taking: Reported on 02/06/2018)  . oxyCODONE-acetaminophen (PERCOCET) 5-325 MG tablet Take 1 tablet by mouth every 8 (eight) hours as needed for severe pain. (Patient taking differently: Take 0.5-1 tablets by mouth every 8 (eight) hours as needed for severe pain. )  . polyethylene glycol powder (MIRALAX) powder Take 17 g by mouth daily.  . pregabalin (LYRICA) 100 MG capsule Take 100 mg by mouth at bedtime.   . senna (SENOKOT) 8.6 MG TABS tablet Take 1 tablet by mouth 2 (two) times daily as needed for mild constipation.  . senna-docusate (SENOKOT-S) 8.6-50 MG tablet Take 1 tablet by mouth 2 (two) times daily. (Patient not taking: Reported on 02/06/2018)  . sevelamer carbonate (RENVELA)  800 MG tablet Take 2,400 mg by mouth 3 (three) times daily with meals.   . traZODone (DESYREL) 50 MG tablet Take 50 mg by mouth at bedtime.   No facility-administered encounter medications on file as of 02/15/2018.     PHYSICAL EXAM:   General: Chronically ill and fatigued appearing.  In NAD in wheelchair Cardiovascular: regular rate and rhythm Pulmonary: clear in all lung fields.  Unlabored respirations Abdomen: soft, nontender, + bowel sounds GU: no suprapubic tenderness Extremities: Edema of anterior BLE R>L Skin: Exposed skin is intact Neurological: Alert and oriented but intermittently dozes.   Weakness  Gonzella Lex, NP-C

## 2018-02-19 DIAGNOSIS — N186 End stage renal disease: Secondary | ICD-10-CM | POA: Diagnosis not present

## 2018-02-19 DIAGNOSIS — I739 Peripheral vascular disease, unspecified: Secondary | ICD-10-CM | POA: Diagnosis not present

## 2018-02-19 DIAGNOSIS — I10 Essential (primary) hypertension: Secondary | ICD-10-CM | POA: Diagnosis not present

## 2018-02-19 DIAGNOSIS — I679 Cerebrovascular disease, unspecified: Secondary | ICD-10-CM | POA: Diagnosis not present

## 2018-02-19 DIAGNOSIS — I509 Heart failure, unspecified: Secondary | ICD-10-CM | POA: Diagnosis not present

## 2018-02-19 DIAGNOSIS — R569 Unspecified convulsions: Secondary | ICD-10-CM | POA: Diagnosis not present

## 2018-02-22 DIAGNOSIS — I679 Cerebrovascular disease, unspecified: Secondary | ICD-10-CM | POA: Diagnosis not present

## 2018-02-22 DIAGNOSIS — N186 End stage renal disease: Secondary | ICD-10-CM | POA: Diagnosis not present

## 2018-02-22 DIAGNOSIS — R569 Unspecified convulsions: Secondary | ICD-10-CM | POA: Diagnosis not present

## 2018-02-22 DIAGNOSIS — I739 Peripheral vascular disease, unspecified: Secondary | ICD-10-CM | POA: Diagnosis not present

## 2018-02-22 DIAGNOSIS — I10 Essential (primary) hypertension: Secondary | ICD-10-CM | POA: Diagnosis not present

## 2018-02-22 DIAGNOSIS — I509 Heart failure, unspecified: Secondary | ICD-10-CM | POA: Diagnosis not present

## 2018-02-23 DIAGNOSIS — I739 Peripheral vascular disease, unspecified: Secondary | ICD-10-CM | POA: Diagnosis not present

## 2018-02-23 DIAGNOSIS — R569 Unspecified convulsions: Secondary | ICD-10-CM | POA: Diagnosis not present

## 2018-02-23 DIAGNOSIS — I10 Essential (primary) hypertension: Secondary | ICD-10-CM | POA: Diagnosis not present

## 2018-02-23 DIAGNOSIS — I509 Heart failure, unspecified: Secondary | ICD-10-CM | POA: Diagnosis not present

## 2018-02-23 DIAGNOSIS — N186 End stage renal disease: Secondary | ICD-10-CM | POA: Diagnosis not present

## 2018-02-23 DIAGNOSIS — I679 Cerebrovascular disease, unspecified: Secondary | ICD-10-CM | POA: Diagnosis not present

## 2018-02-24 DIAGNOSIS — I739 Peripheral vascular disease, unspecified: Secondary | ICD-10-CM | POA: Diagnosis not present

## 2018-02-24 DIAGNOSIS — I10 Essential (primary) hypertension: Secondary | ICD-10-CM | POA: Diagnosis not present

## 2018-02-24 DIAGNOSIS — R569 Unspecified convulsions: Secondary | ICD-10-CM | POA: Diagnosis not present

## 2018-02-24 DIAGNOSIS — N186 End stage renal disease: Secondary | ICD-10-CM | POA: Diagnosis not present

## 2018-02-24 DIAGNOSIS — I509 Heart failure, unspecified: Secondary | ICD-10-CM | POA: Diagnosis not present

## 2018-02-24 DIAGNOSIS — I679 Cerebrovascular disease, unspecified: Secondary | ICD-10-CM | POA: Diagnosis not present

## 2018-02-25 DIAGNOSIS — N186 End stage renal disease: Secondary | ICD-10-CM | POA: Diagnosis not present

## 2018-02-25 DIAGNOSIS — I739 Peripheral vascular disease, unspecified: Secondary | ICD-10-CM | POA: Diagnosis not present

## 2018-02-25 DIAGNOSIS — I10 Essential (primary) hypertension: Secondary | ICD-10-CM | POA: Diagnosis not present

## 2018-02-25 DIAGNOSIS — R569 Unspecified convulsions: Secondary | ICD-10-CM | POA: Diagnosis not present

## 2018-02-25 DIAGNOSIS — I679 Cerebrovascular disease, unspecified: Secondary | ICD-10-CM | POA: Diagnosis not present

## 2018-02-25 DIAGNOSIS — I509 Heart failure, unspecified: Secondary | ICD-10-CM | POA: Diagnosis not present

## 2018-02-26 DIAGNOSIS — I509 Heart failure, unspecified: Secondary | ICD-10-CM | POA: Diagnosis not present

## 2018-02-26 DIAGNOSIS — I739 Peripheral vascular disease, unspecified: Secondary | ICD-10-CM | POA: Diagnosis not present

## 2018-02-26 DIAGNOSIS — R569 Unspecified convulsions: Secondary | ICD-10-CM | POA: Diagnosis not present

## 2018-02-26 DIAGNOSIS — I10 Essential (primary) hypertension: Secondary | ICD-10-CM | POA: Diagnosis not present

## 2018-02-26 DIAGNOSIS — I679 Cerebrovascular disease, unspecified: Secondary | ICD-10-CM | POA: Diagnosis not present

## 2018-02-26 DIAGNOSIS — N186 End stage renal disease: Secondary | ICD-10-CM | POA: Diagnosis not present

## 2018-02-27 ENCOUNTER — Telehealth: Payer: Self-pay | Admitting: Family Medicine

## 2018-02-27 DIAGNOSIS — N186 End stage renal disease: Secondary | ICD-10-CM | POA: Diagnosis not present

## 2018-02-27 DIAGNOSIS — I679 Cerebrovascular disease, unspecified: Secondary | ICD-10-CM | POA: Diagnosis not present

## 2018-02-27 DIAGNOSIS — R569 Unspecified convulsions: Secondary | ICD-10-CM | POA: Diagnosis not present

## 2018-02-27 DIAGNOSIS — I739 Peripheral vascular disease, unspecified: Secondary | ICD-10-CM | POA: Diagnosis not present

## 2018-02-27 DIAGNOSIS — I509 Heart failure, unspecified: Secondary | ICD-10-CM | POA: Diagnosis not present

## 2018-02-27 DIAGNOSIS — I10 Essential (primary) hypertension: Secondary | ICD-10-CM | POA: Diagnosis not present

## 2018-03-01 NOTE — Telephone Encounter (Signed)
Message sent to Dr. Jordan for review. 

## 2018-03-02 NOTE — Telephone Encounter (Signed)
The nurse line called to relay a message from [place] at [facility].  The patient passed away this morning at 10am

## 2018-03-02 DEATH — deceased

## 2018-03-23 ENCOUNTER — Encounter (INDEPENDENT_AMBULATORY_CARE_PROVIDER_SITE_OTHER): Payer: Medicare Other

## 2018-03-23 ENCOUNTER — Ambulatory Visit (INDEPENDENT_AMBULATORY_CARE_PROVIDER_SITE_OTHER): Payer: Medicare Other | Admitting: Vascular Surgery

## 2019-12-14 IMAGING — DX DG THORACIC SPINE 2V
2 series · 2 of 2 positions shown · non-contrast
Comparison: 01/15/2018 and prior chest radiographs

CLINICAL DATA: Mid back pain.

EXAM:
THORACIC SPINE 2 VIEWS

[t thoracic spine ap]
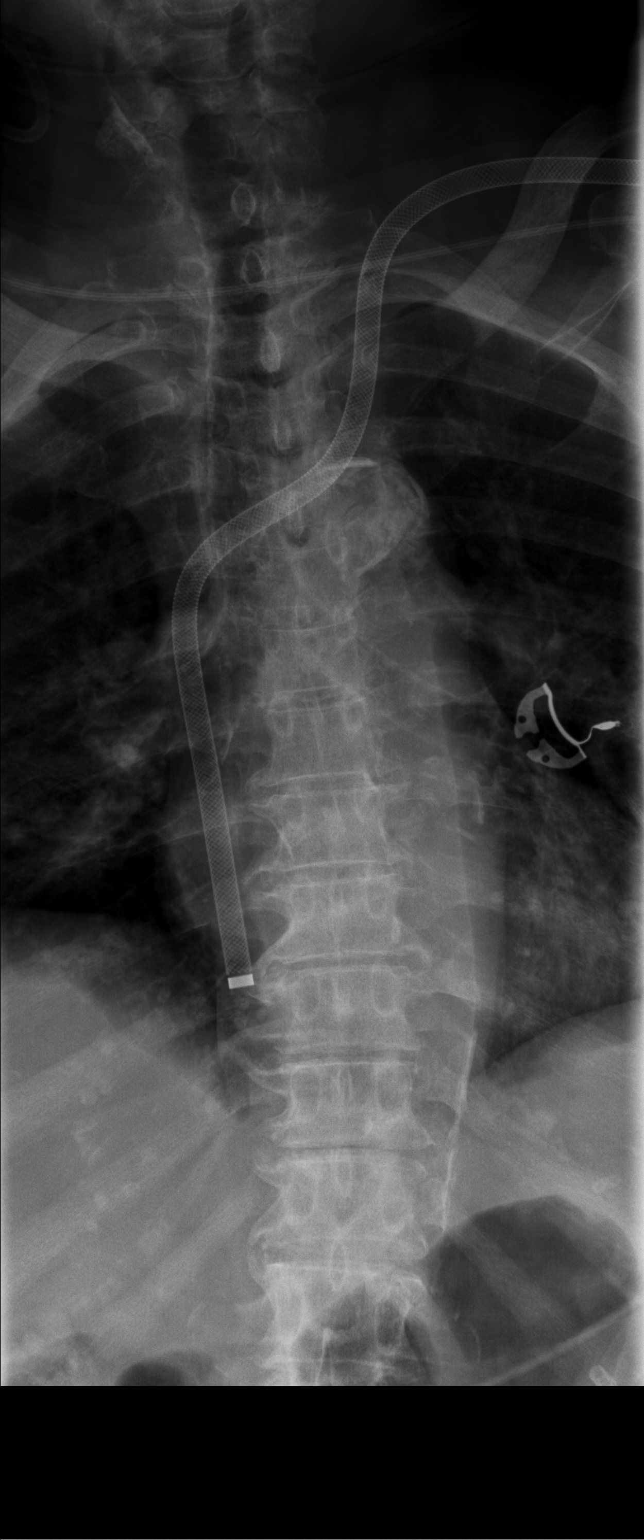

[t thoracic spine lat]
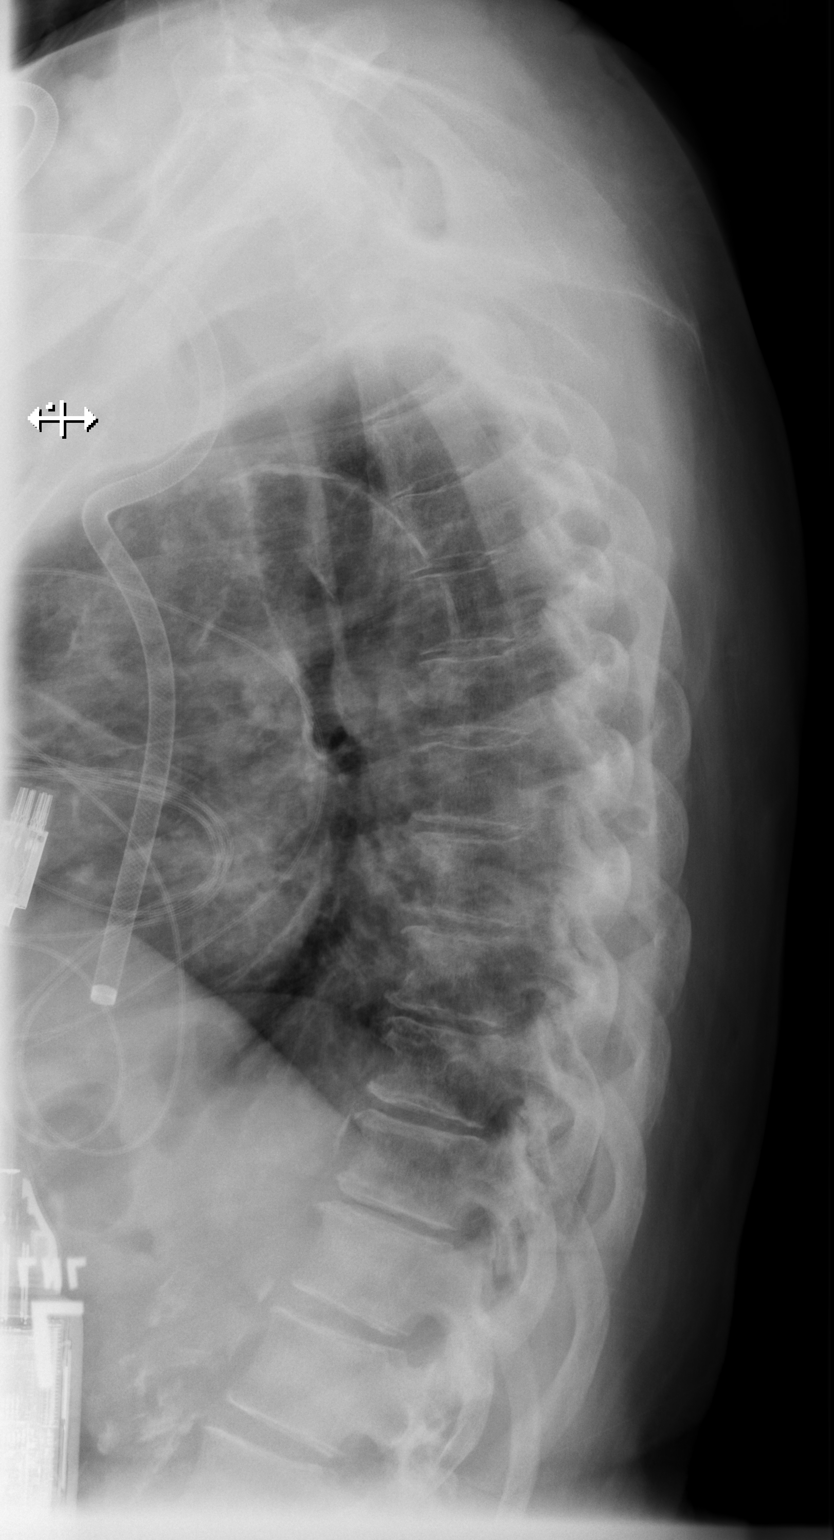

[2 of 2 positions shown; findings below may reference images not displayed]

FINDINGS: No acute fracture or subluxation.

Mild multilevel degenerative disc disease in the LOWER thoracic
spine again noted.

No focal bony lesions are present.

A LEFT IJ central venous catheter is again noted.
IMPRESSION: 1. No acute abnormality
2. Mild multilevel degenerative changes in the LOWER thoracic spine.

## 2019-12-14 IMAGING — DX DG LUMBAR SPINE 2-3V
3 series · 3 of 3 positions shown · non-contrast
Comparison: 08/20/2017 CT and prior studies

CLINICAL DATA: Chronic low back pain.

EXAM:
LUMBAR SPINE - 2-3 VIEW

[t lumbar spine ap]
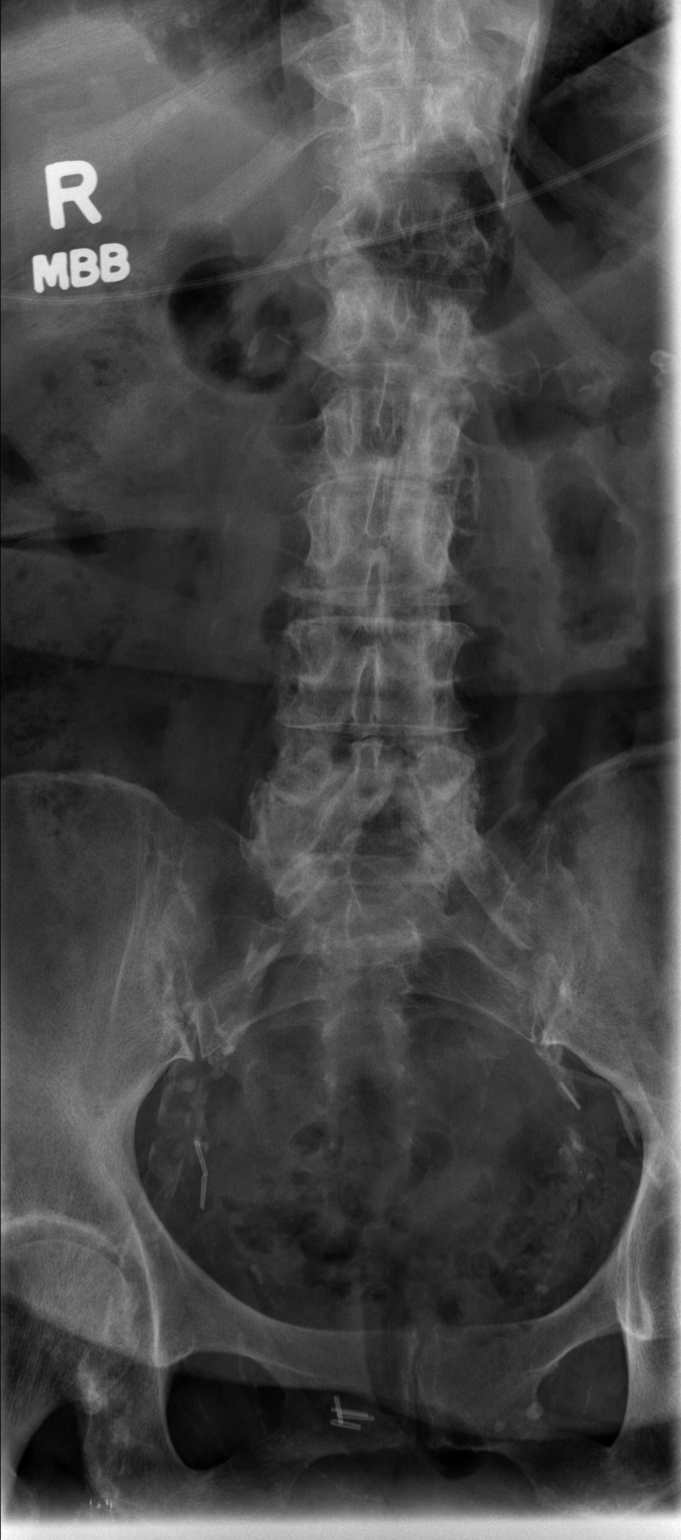

[t lumbar spine lat]
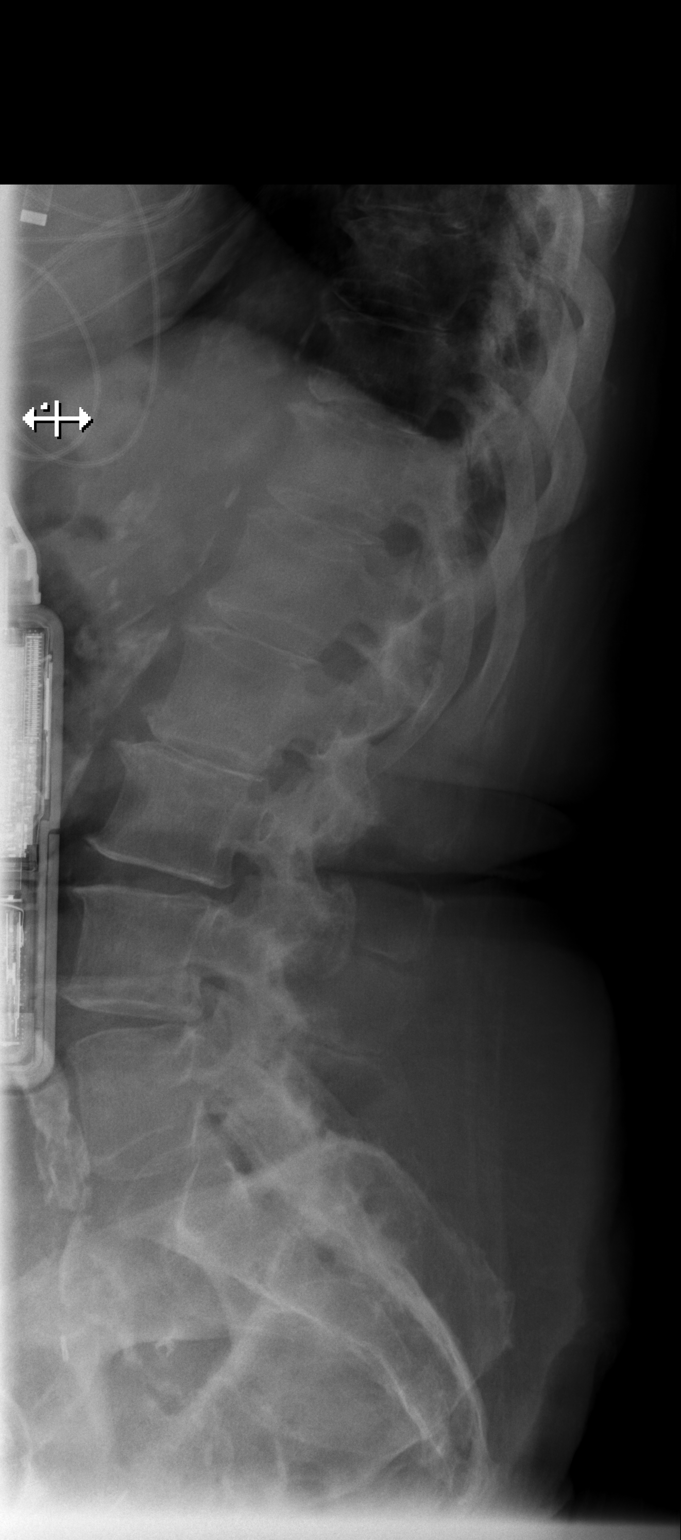

[t lumbar l-5 s-1 spot]
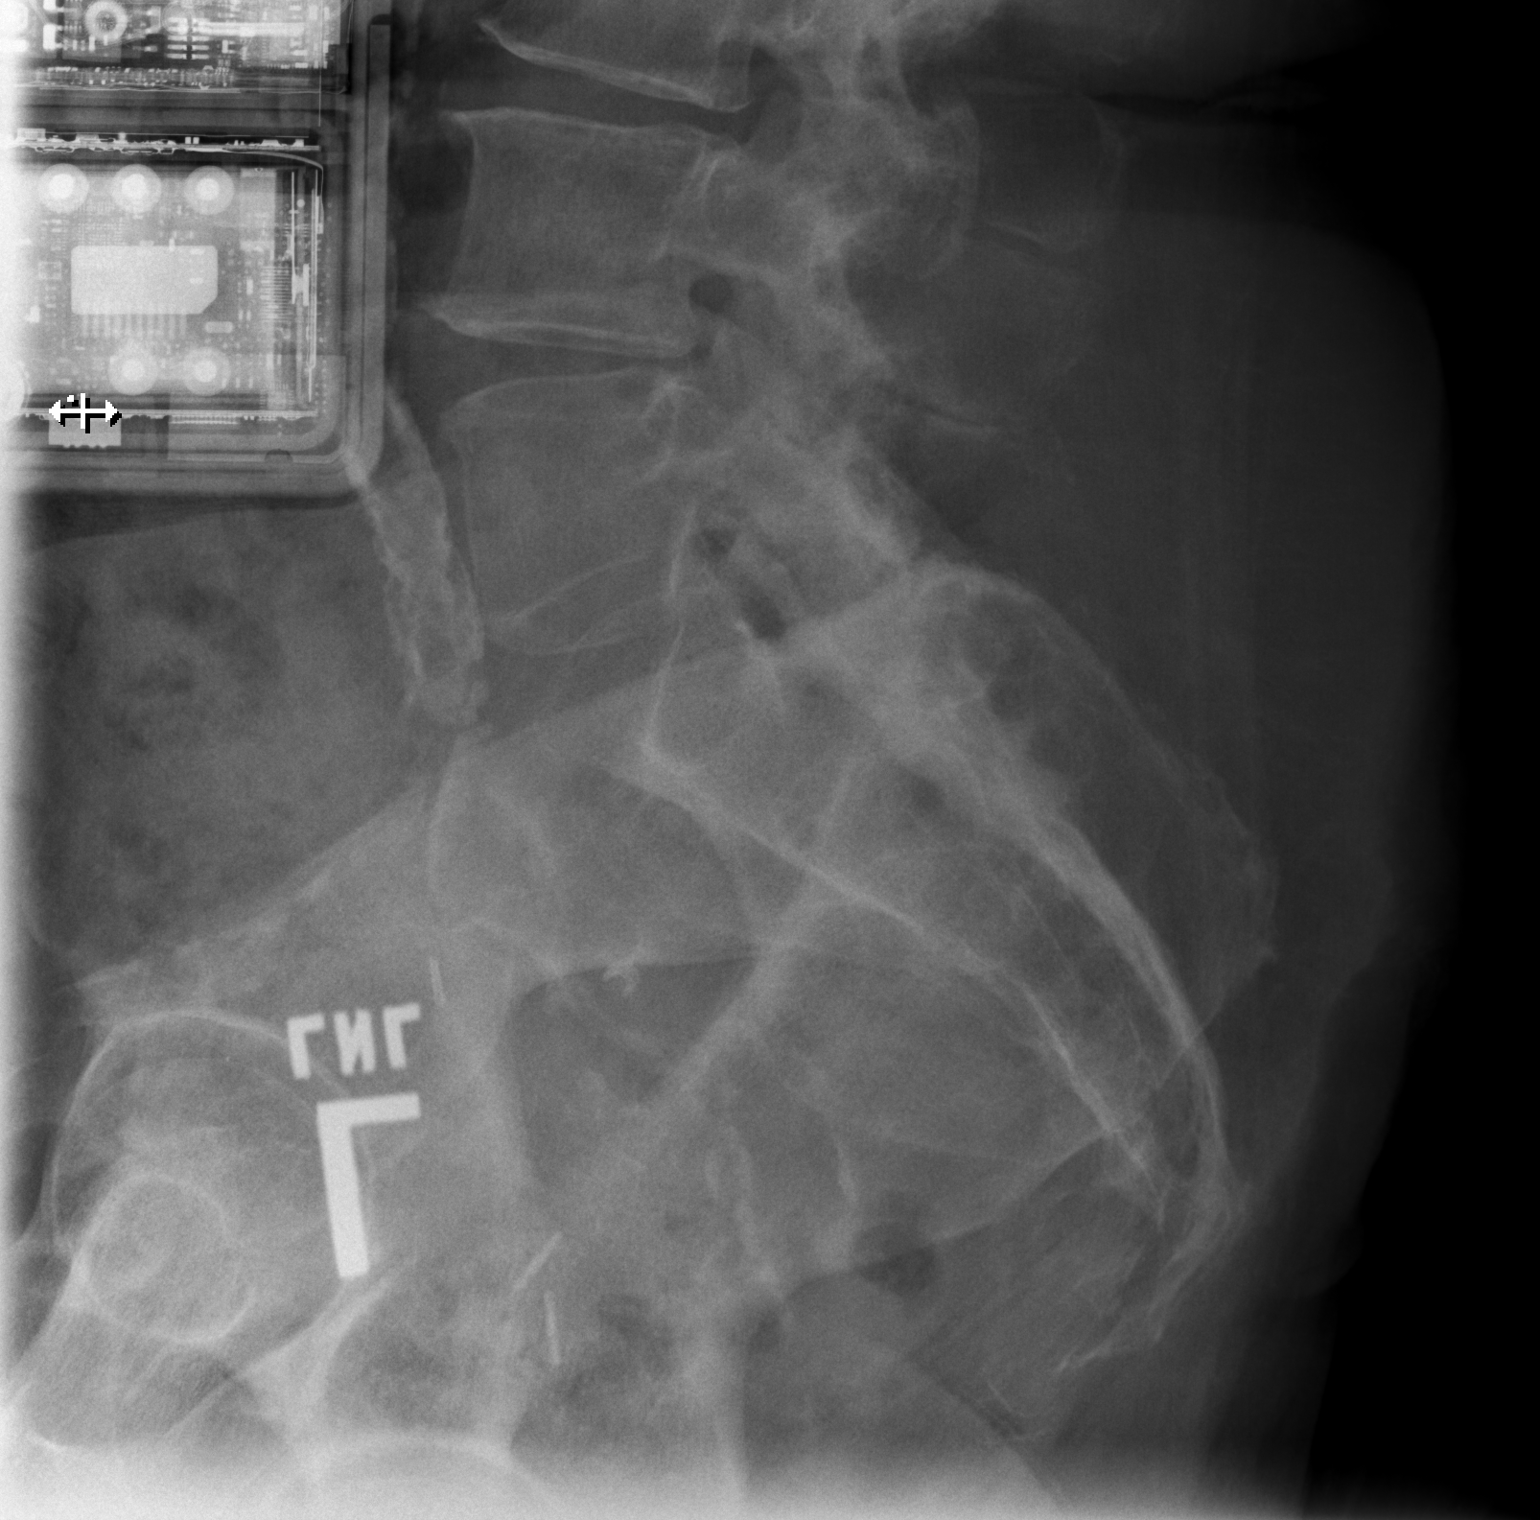

[3 of 3 positions shown; findings below may reference images not displayed]

FINDINGS: No acute fracture or subluxation noted.

Mild to moderate multilevel degenerative disc disease, spondylosis
and facet arthropathy noted.

No focal bony lesions identified.

Aortic atherosclerotic calcifications again noted.
IMPRESSION: 1. No acute abnormality
2. Mild to moderate multilevel degenerative changes.
3.  Aortic Atherosclerosis ([id number]).
